# Patient Record
Sex: Female | Born: 1935 | Race: White | Hispanic: No | Smoking: Former smoker
Health system: Southern US, Community
[De-identification: ages and names within clinical notes are randomized; demographics above are authoritative.]

## PROBLEM LIST (undated history)

## (undated) DIAGNOSIS — M109 Gout, unspecified: Secondary | ICD-10-CM

## (undated) DIAGNOSIS — E119 Type 2 diabetes mellitus without complications: Secondary | ICD-10-CM

## (undated) DIAGNOSIS — E039 Hypothyroidism, unspecified: Secondary | ICD-10-CM

## (undated) DIAGNOSIS — R059 Cough, unspecified: Secondary | ICD-10-CM

## (undated) DIAGNOSIS — I639 Cerebral infarction, unspecified: Secondary | ICD-10-CM

## (undated) DIAGNOSIS — I10 Essential (primary) hypertension: Secondary | ICD-10-CM

## (undated) DIAGNOSIS — K279 Peptic ulcer, site unspecified, unspecified as acute or chronic, without hemorrhage or perforation: Secondary | ICD-10-CM

## (undated) DIAGNOSIS — D649 Anemia, unspecified: Secondary | ICD-10-CM

## (undated) DIAGNOSIS — Z972 Presence of dental prosthetic device (complete) (partial): Secondary | ICD-10-CM

## (undated) DIAGNOSIS — R05 Cough: Secondary | ICD-10-CM

## (undated) HISTORY — PX: OTHER SURGICAL HISTORY: SHX169

## (undated) HISTORY — PX: APPENDECTOMY: SHX54

## (undated) HISTORY — PX: DILATION AND CURETTAGE OF UTERUS: SHX78

## (undated) HISTORY — PX: TONSILLECTOMY: SUR1361

## (undated) HISTORY — PX: TUBAL LIGATION: SHX77

---

## 2004-09-17 ENCOUNTER — Ambulatory Visit: Payer: Self-pay | Admitting: Internal Medicine

## 2005-10-26 ENCOUNTER — Ambulatory Visit: Payer: Self-pay | Admitting: Internal Medicine

## 2005-10-26 IMAGING — MG MM CAD SCREENING MAMMO
1 series · 4 of 4 positions shown · non-contrast
Comparison: none

REASON FOR EXAM: Screening mammography
COMMENTS:

PROCEDURE:     MAM - MAM DGTL SCREENING MAMMO W/CAD  - October 26, 2005 [DATE]
RESULT:       The current exam is compared to prior examinations of 09/17/04
and 08/06/03.  No mass or malignant-appearing microcalcifications are seen.
The breast parenchyma is of the fatty type.

[Series 5309: R CC · right · 4 of 4 slices shown]
[im 1/4]
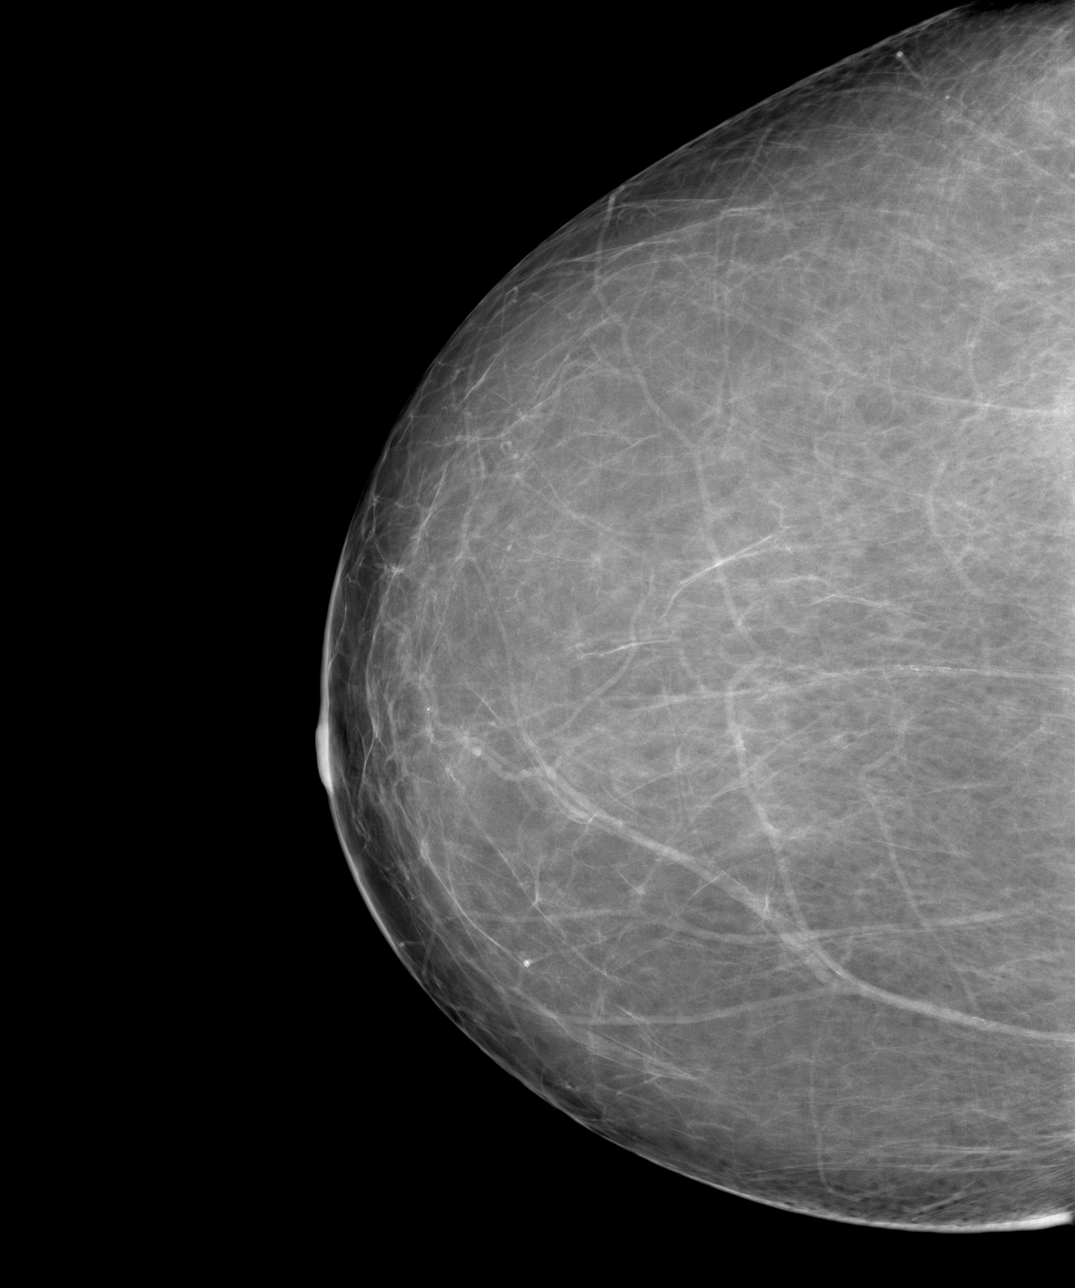
[im 2/4]
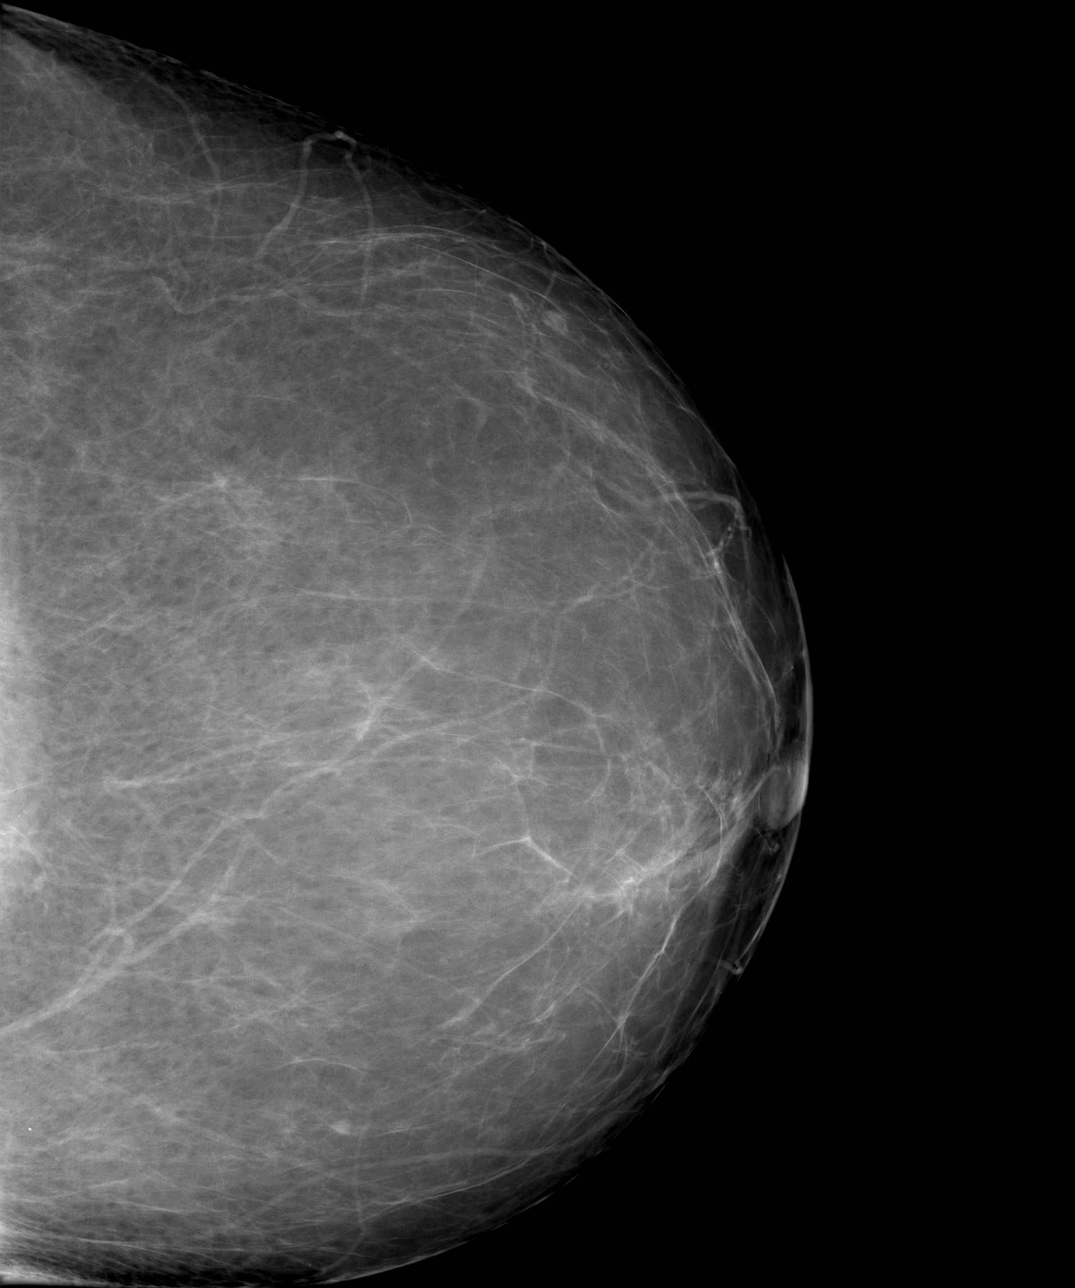
[im 3/4]
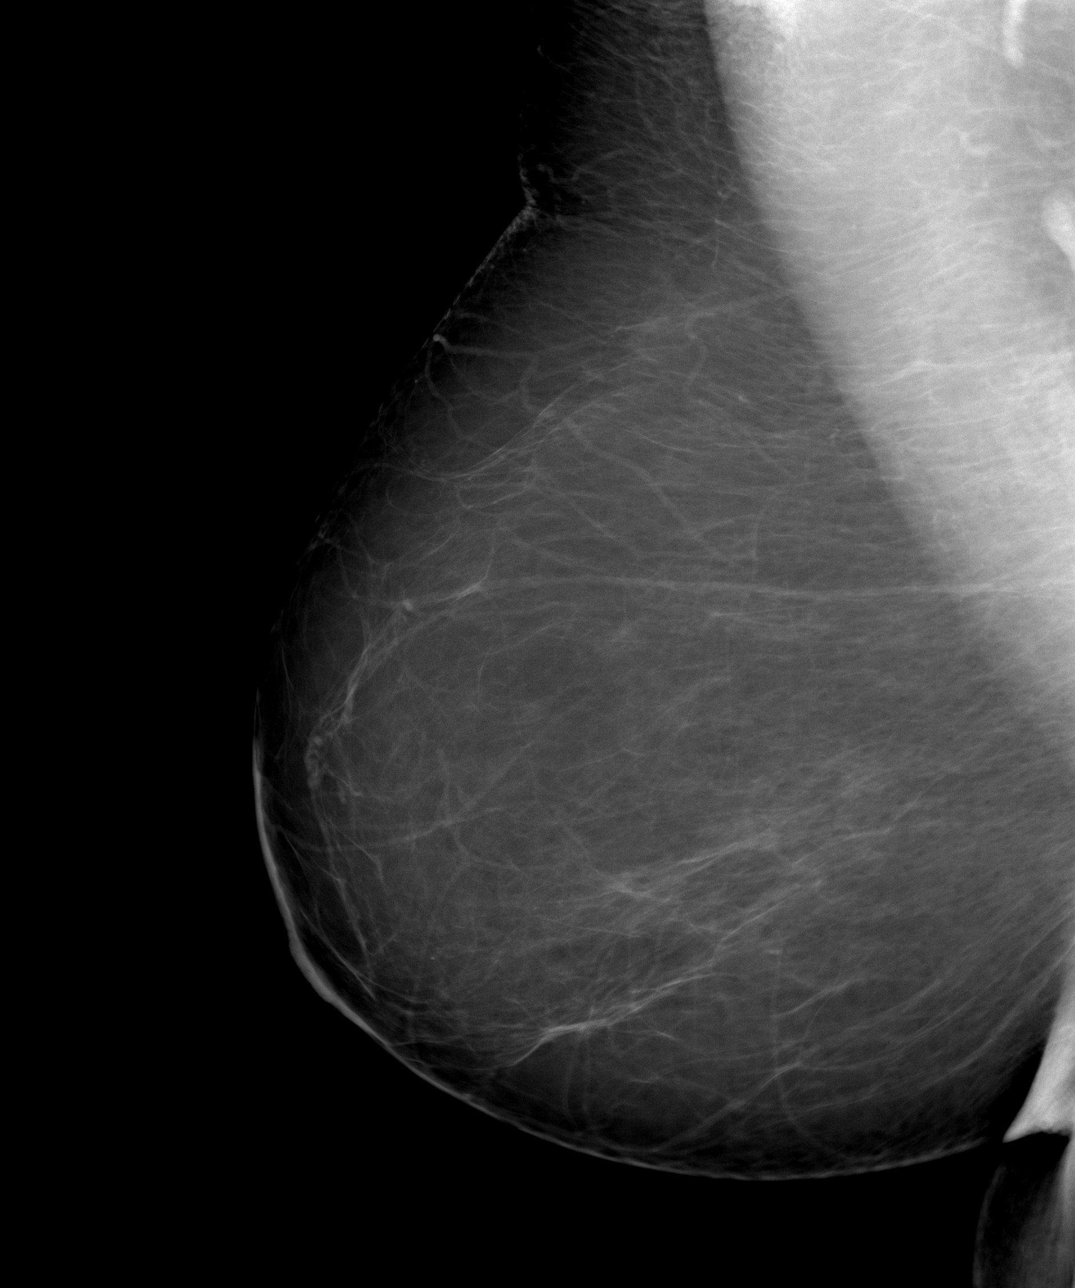
[im 4/4]
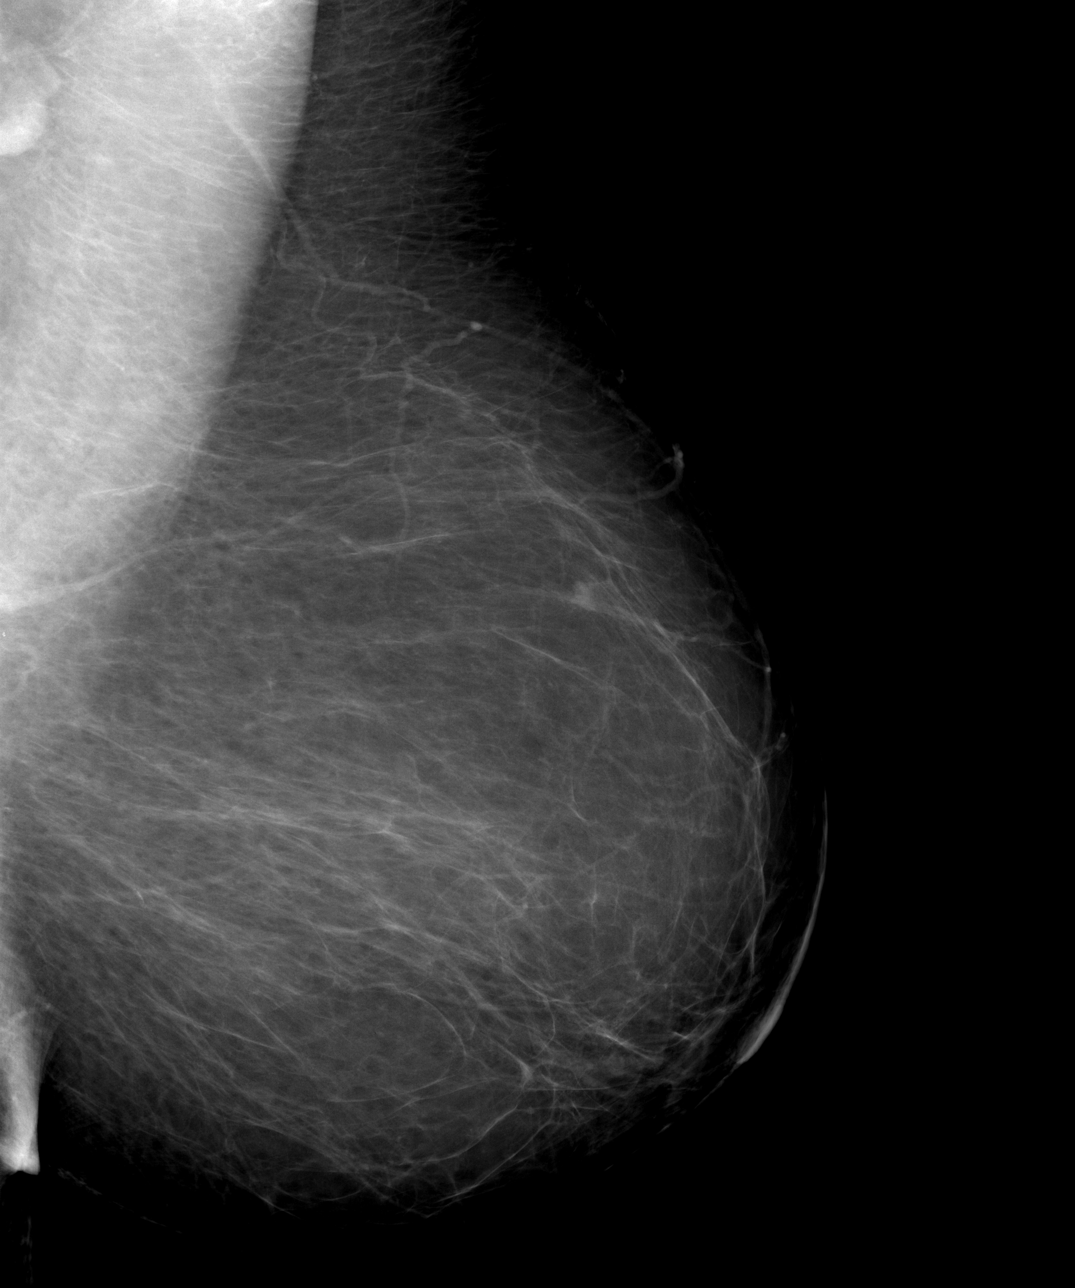

[4 of 4 positions shown; findings below may reference images not displayed]

IMPRESSION: 1.     Bilaterally benign appearing screening mammography.
2.     Continued annual screening mammography is recommended.
3.     BI-RADS: Category 1-Negative.

A NEGATIVE MAMMOGRAM REPORT DOES NOT PRECLUDE BIOPSY OR OTHER EVALUATION OF
A CLINICALLY PALPABLE OR OTHERWISE SUSPICIOUS MASS OR LESION.   BREAST

## 2006-01-23 ENCOUNTER — Other Ambulatory Visit: Payer: Self-pay

## 2006-01-23 ENCOUNTER — Inpatient Hospital Stay: Payer: Self-pay | Admitting: Surgery

## 2006-01-23 IMAGING — US ABDOMEN ULTRASOUND
1 series · 17 of 25 positions shown · non-contrast
Comparison: none

REASON FOR EXAM: Epigastric and RUQ pain with nausea
COMMENTS:

[Series 1: abdomen ultrasound · 17 of 73 slices shown]
[im 1/73]
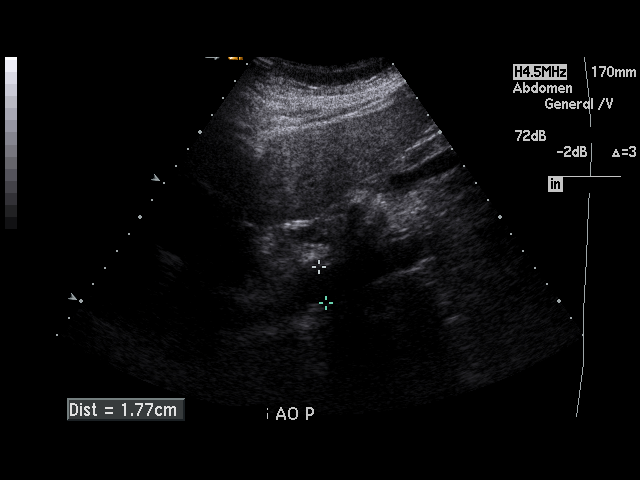
[im 7/73]
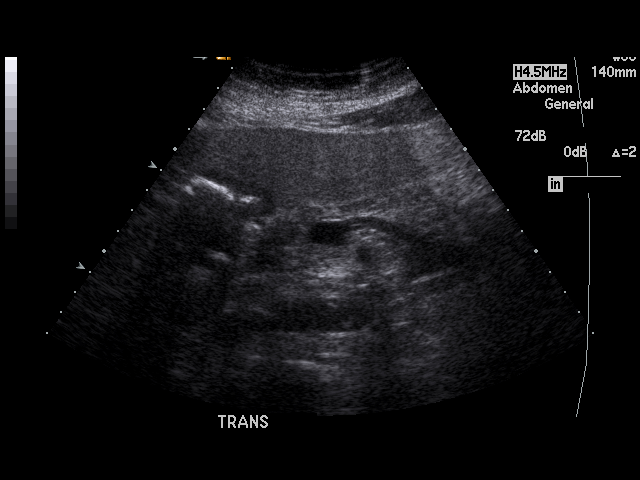
[im 10/73]
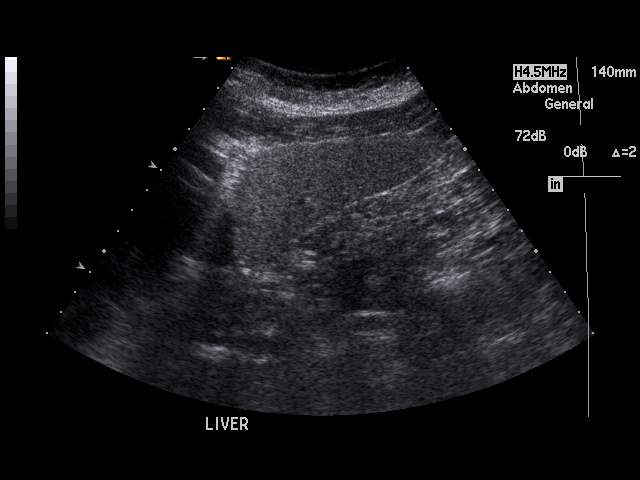
[im 16/73]
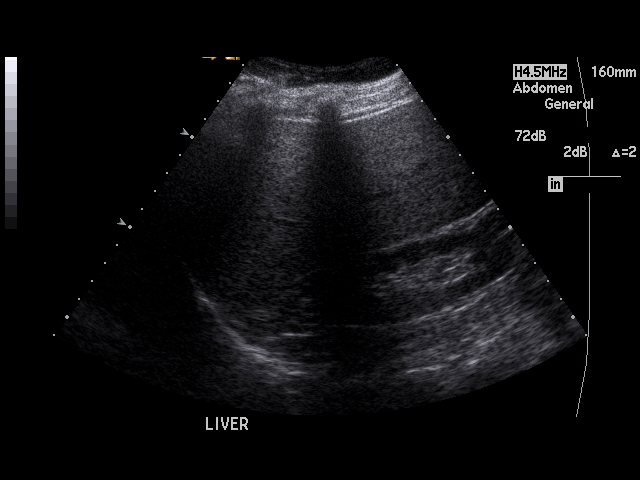
[im 19/73]
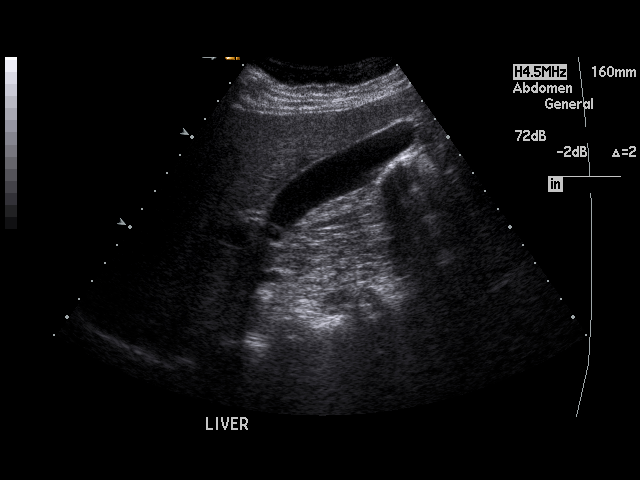
[im 25/73]
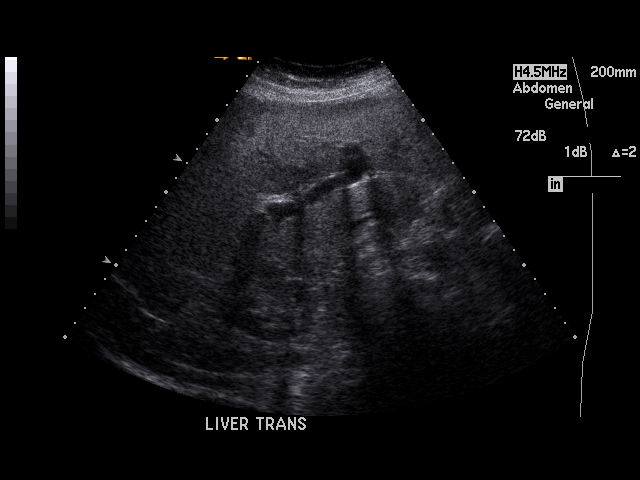
[im 28/73]
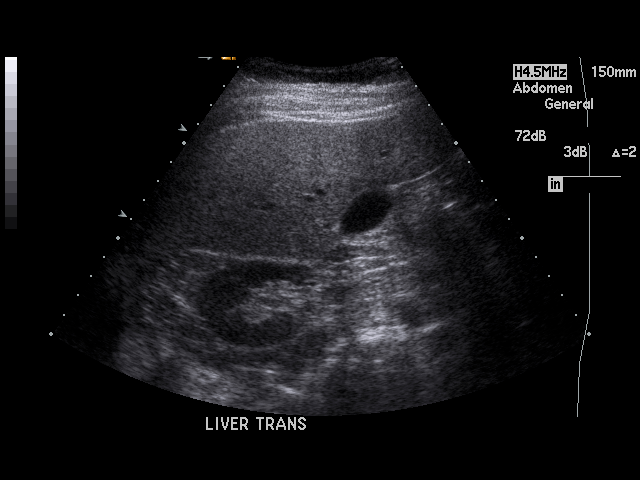
[im 34/73]
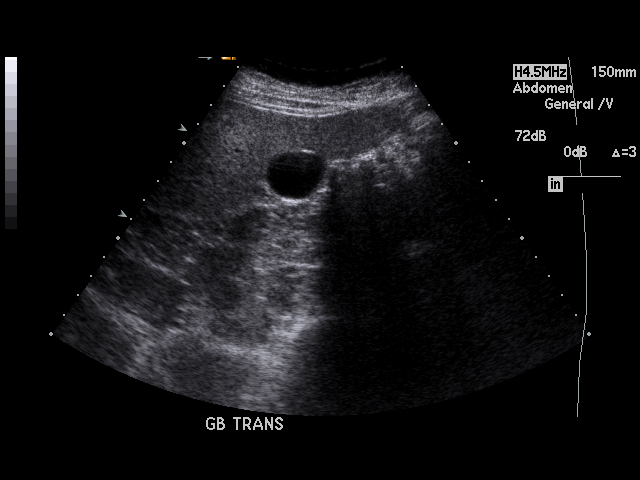
[im 37/73]
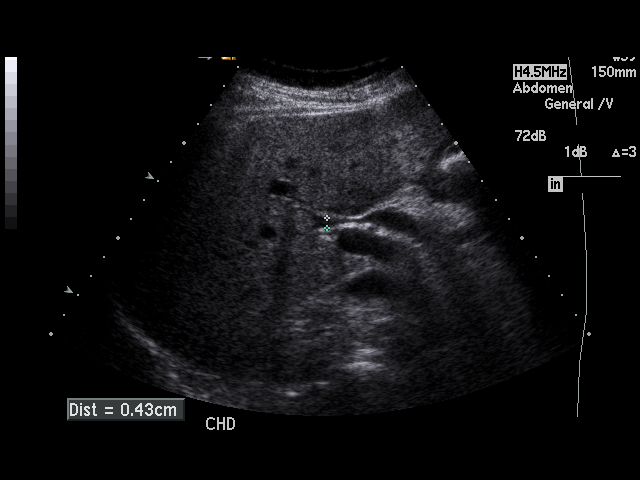
[im 40/73]
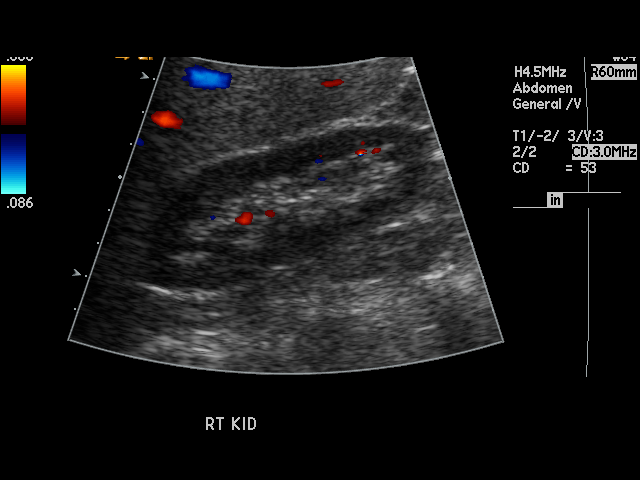
[im 46/73]
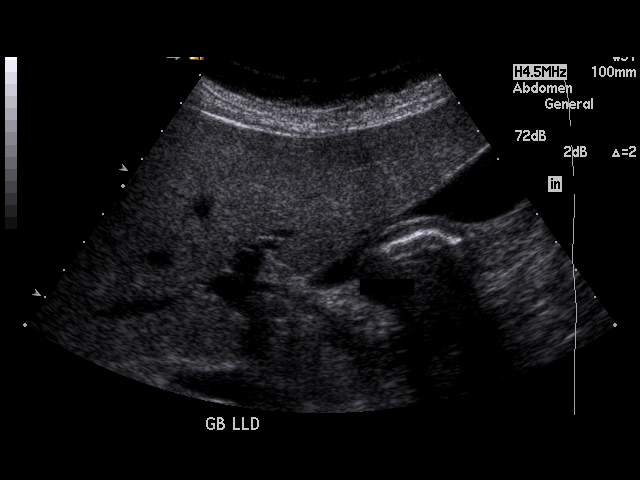
[im 49/73]
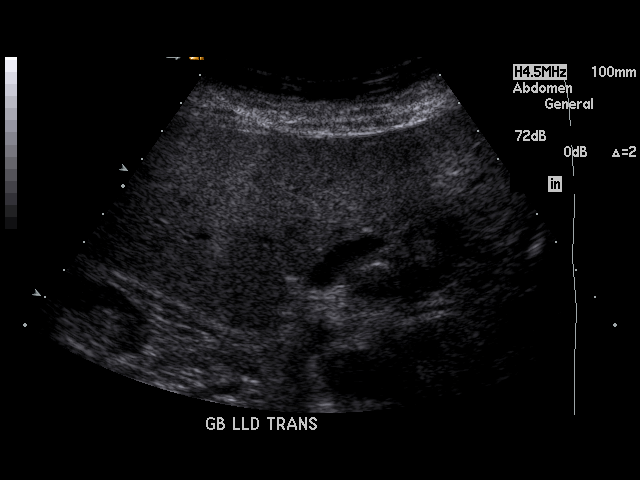
[im 55/73]
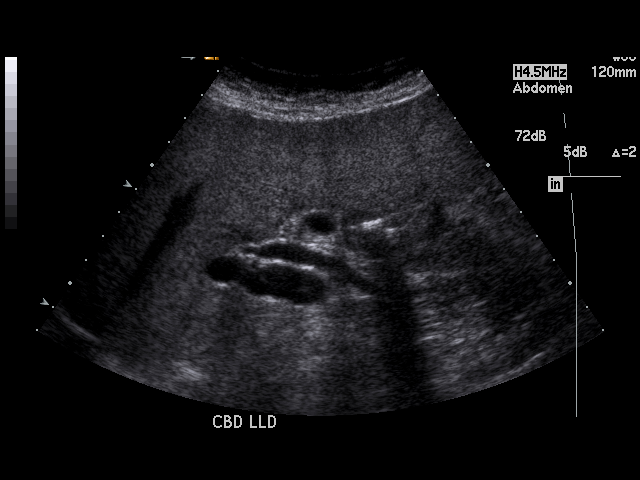
[im 58/73]
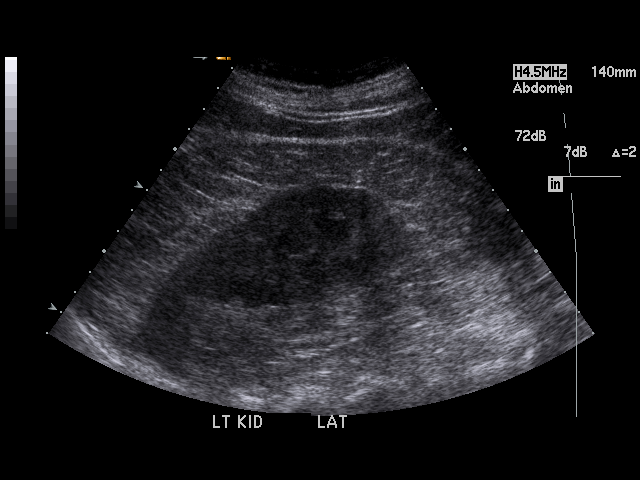
[im 64/73]
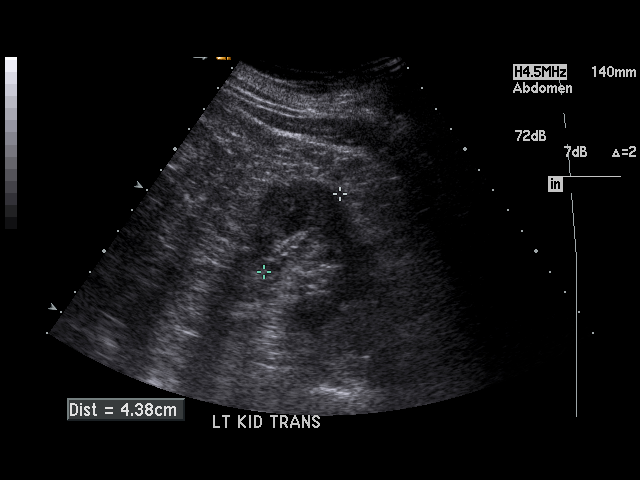
[im 67/73]
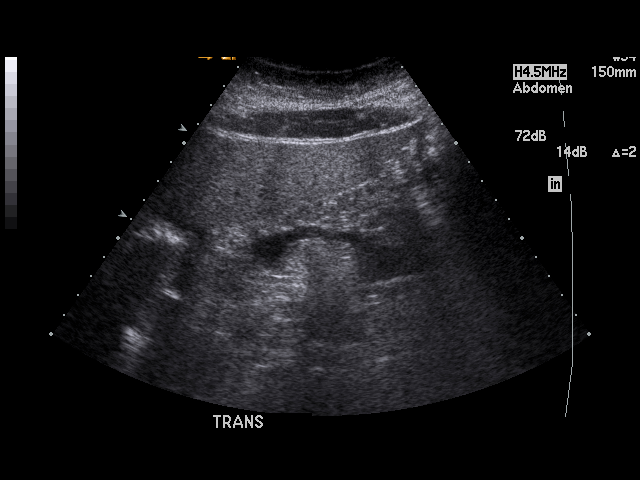
[im 73/73]
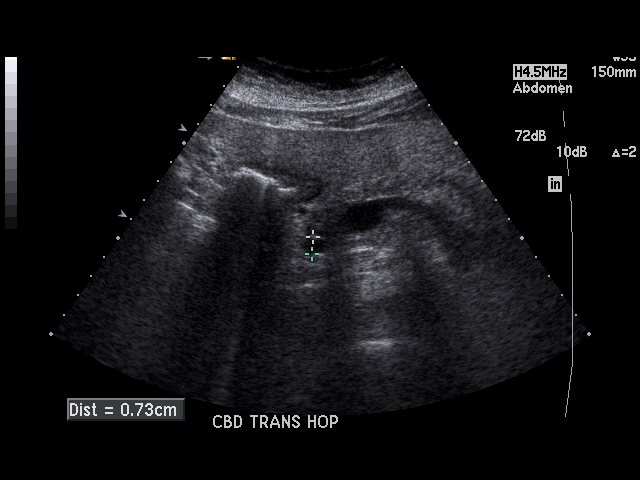

[17 of 25 positions shown; findings below may reference images not displayed]

PROCEDURE:     US  - US ABDOMEN GENERAL SURVEY  - January 23, 2006  [DATE]

RESULT:      The liver demonstrates a heterogeneous echotexture.  The common
bile duct measures 9.5 mm in diameter.  Gallbladder wall thickness is
mm.  There is no evidence of pericholecystic fluid, stones or sludging.  The
patient did not demonstrate a sonographic Murphy sign.

The pancreatic duct is dilated in its proximal location at 3.2 mm.  The
pancreas otherwise demonstrates a mildly heterogeneous echotexture.
Evaluation of the kidneys and spleen demonstrate no radiographic
abnormalities.  There is no evidence of abdominal aortic aneurysm.
IMPRESSION: 1.     The common bile duct is dilated even considering the patient's age.
There is mild dilation/prominence of the proximal pancreatic duct.  Clinical
correlation recommended.
2.     No ultrasound evidence of acute cholecystitis.
3.     Dr. Laura of the Emergency Department was informed of these
findings at the time of the initial interpretation.

## 2006-01-23 IMAGING — CT CT ABD-PELV W/ CM
1 of 2 series · 15 of 32 positions shown, 19 images · non-contrast
Comparison: none

REASON FOR EXAM: (1)  Abdominal pain and nausea; (2) abdominal pain and
nausea
COMMENTS:

[Series 2: abdomen · axial · 0.73mm/px · z∈[-440,-40]mm · 15 of 56 slices shown, 19 images]
[im 3/56  soft-tissue]
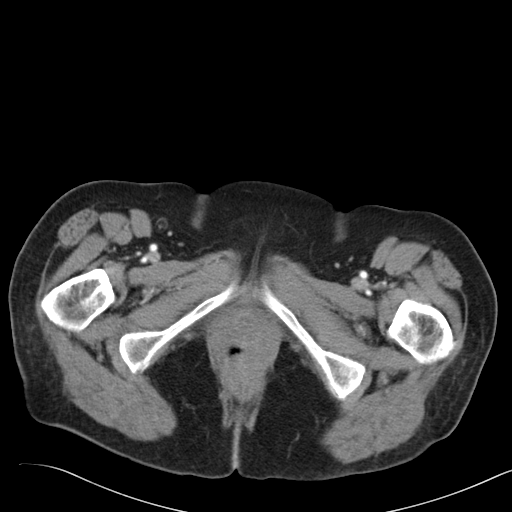
[im 3/56  bone]
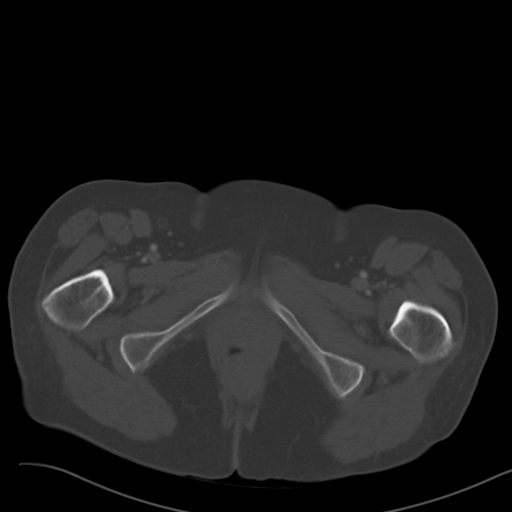
[im 7/56  soft-tissue]
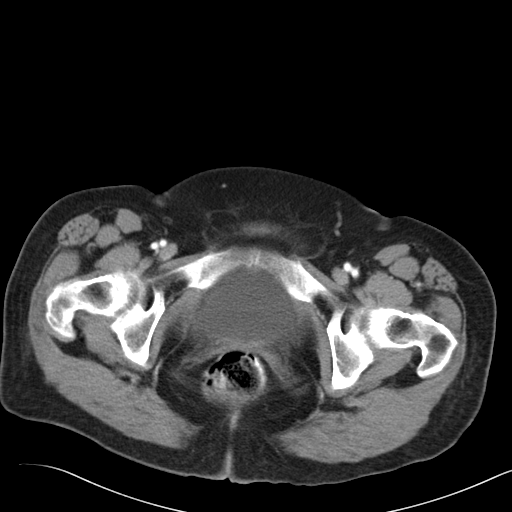
[im 12/56  soft-tissue]
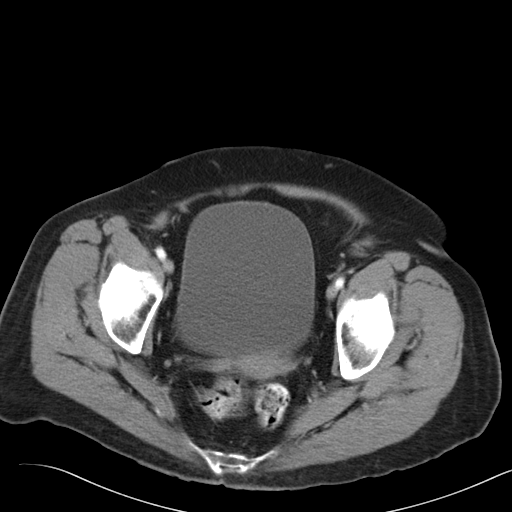
[im 17/56  soft-tissue]
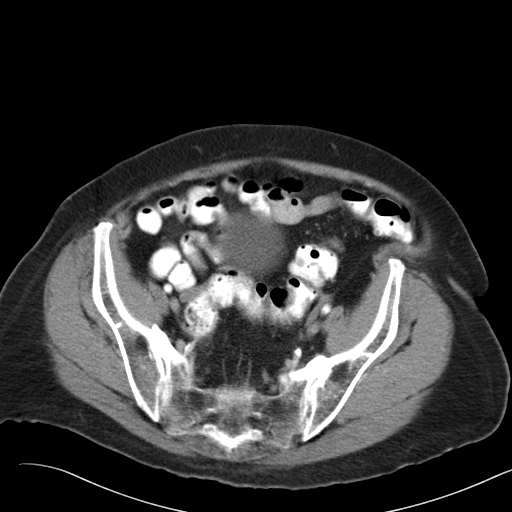
[im 19/56  soft-tissue]
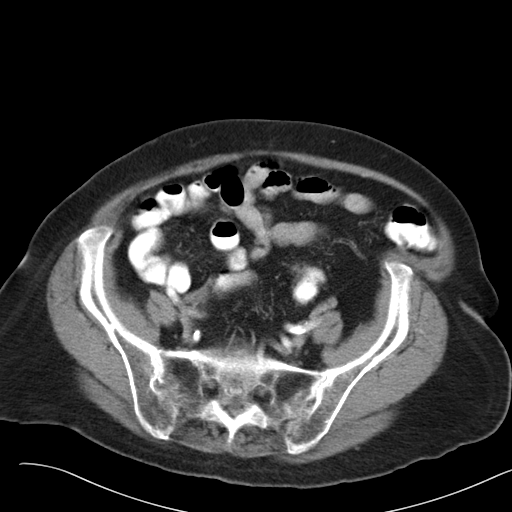
[im 23/56  soft-tissue]
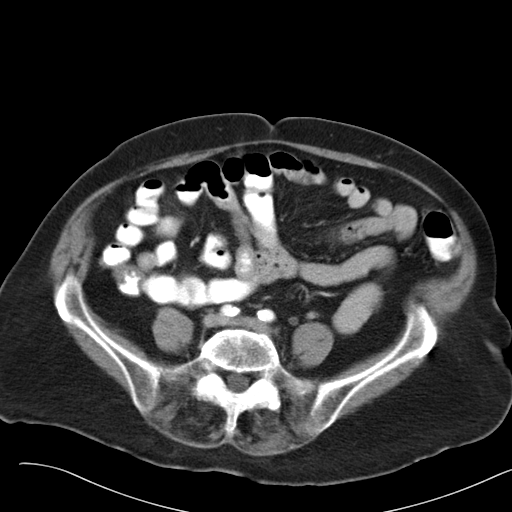
[im 28/56  soft-tissue]
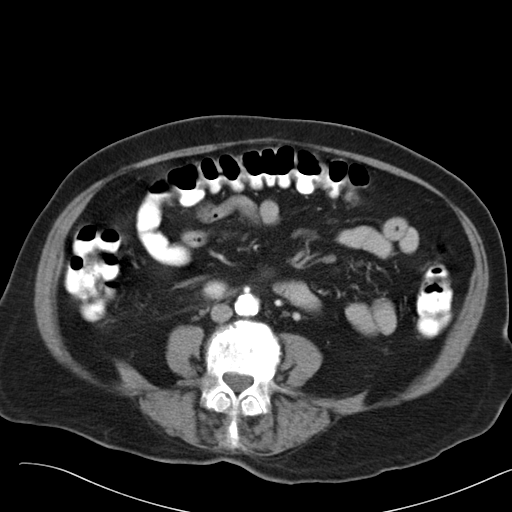
[im 33/56  soft-tissue]
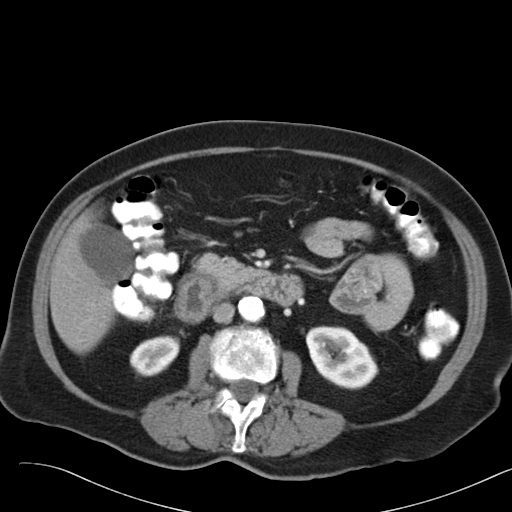
[im 37/56  soft-tissue]
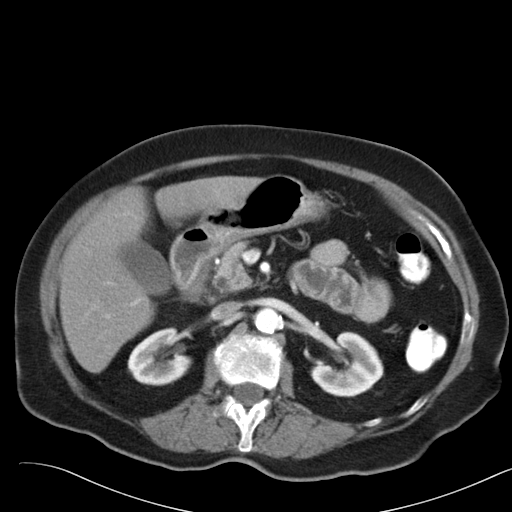
[im 37/56  bone]
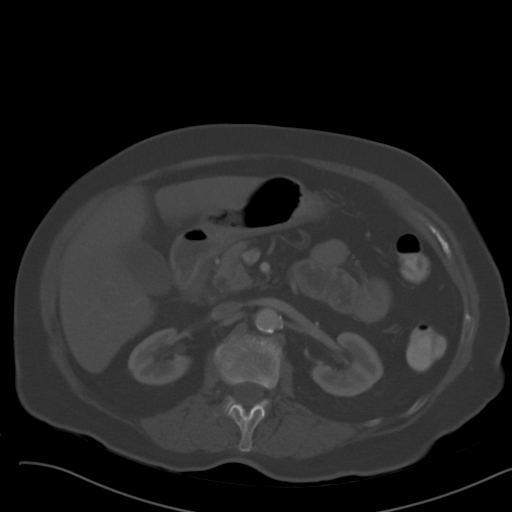
[im 39/56  soft-tissue]
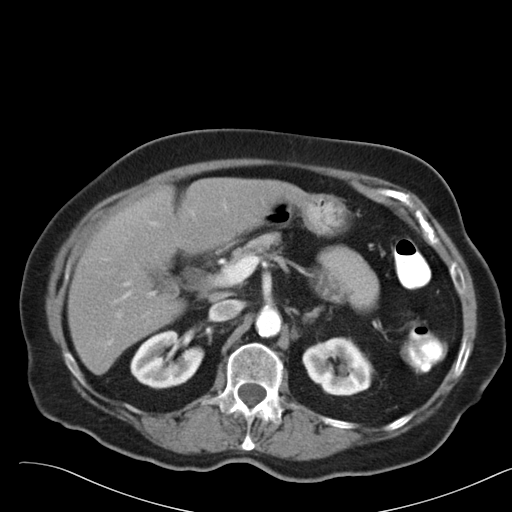
[im 44/56  soft-tissue]
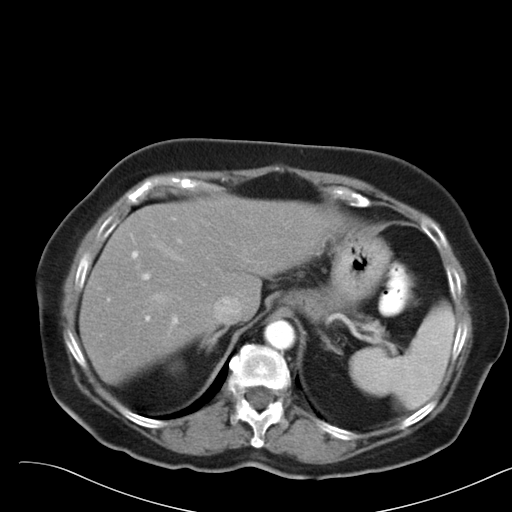
[im 46/56  lung]
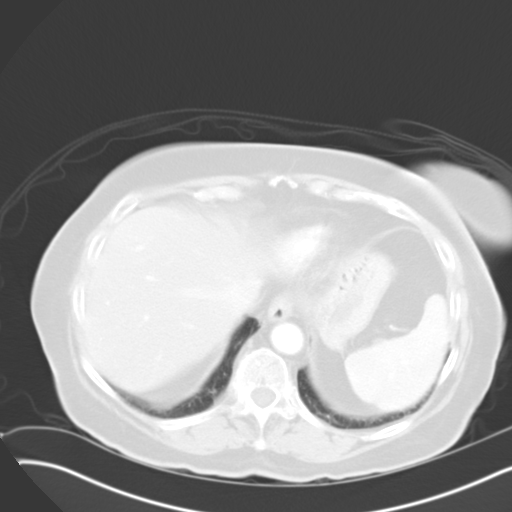
[im 49/56  soft-tissue]
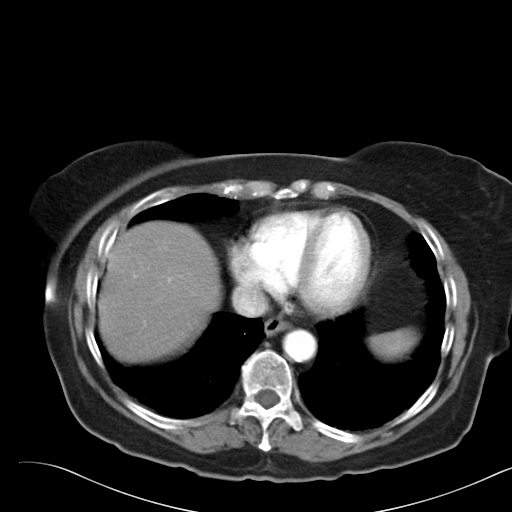
[im 49/56  lung]
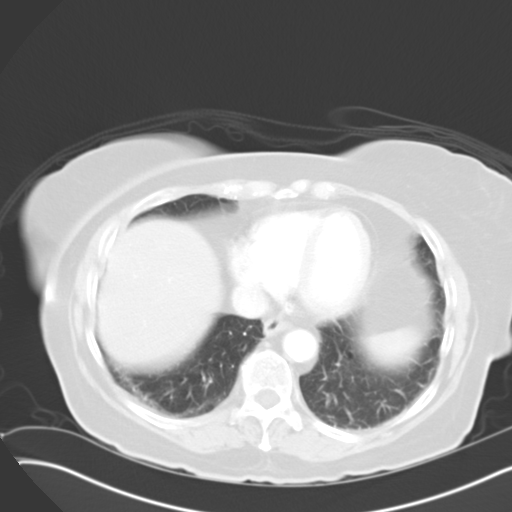
[im 51/56  lung]
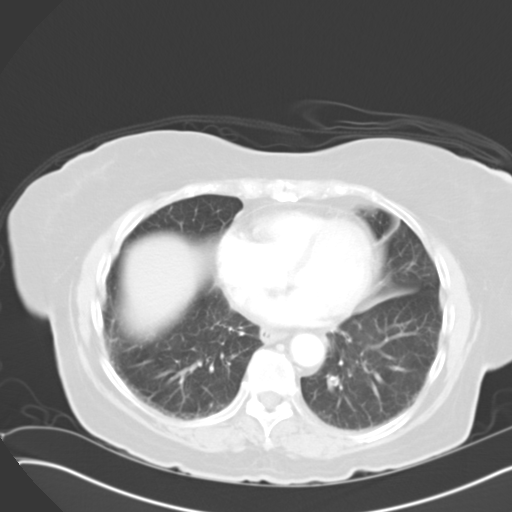
[im 53/56  soft-tissue]
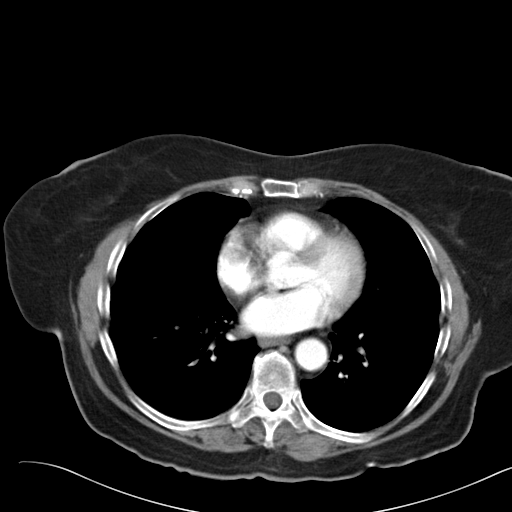
[im 53/56  lung]
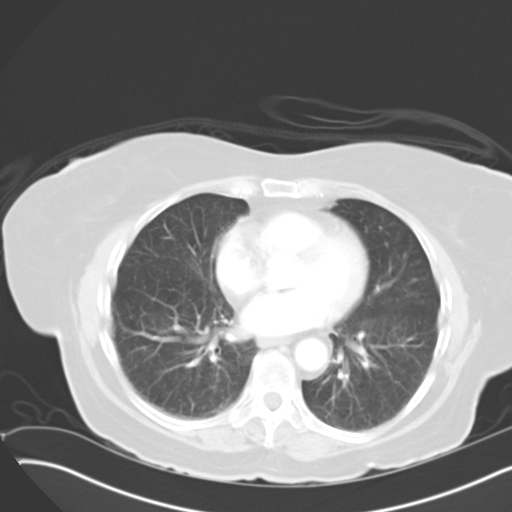

[15 of 32 positions shown; findings below may reference images not displayed]

PROCEDURE:     CT  - CT ABDOMEN / PELVIS  W  - January 23, 2006  [DATE]

RESULT:         Helical 8-mm sections were obtained from the lung bases
through the pubic symphysis status post intravenous administration of 100 ml
of Esovue-P93 and oral contrast.

Evaluation of the lung bases demonstrates a nodular density within the base
of the lingula measuring 7.8 mm.  This is appreciated primarily on the lung
windowing.  No further gross abnormalities are identified.

The liver, spleen, adrenals, pancreas, and kidneys are unremarkable.

Evaluation of the first portion of the duodenum demonstrates mild
inflammatory change within the surrounding mesenteric fat as well as
duodenal wall thickening.  There also appears to be a small amount of fluid
surrounding the duodenum primarily along the interface with the pancreas.

No further abdominal or pelvic masses, free fluid or drainable loculated
fluid collections are identified.

The celiac, SMA and IMA are patent.

Note, a definitive mass within the region of abnormality of the duodenum is
not clearly identified.
IMPRESSION: 1.     Inflammatory changes involving the first portion as well as the
second portion of the duodenum as described above.  Differential
considerations are etiology such as duodenitis or possibly the sequela of
duodenal ulcer disease.  There does not appear to be evidence of appreciable
free air.  This does not exclude the possibility of perforation which may
have subsequently walled off.  A mass within this area cannot be excluded
either and further evaluation with direct visualization is recommended.  No
drainable loculated fluid collections are associated with this finding.
2.     Dr. Geralda of the Emergency Department was informed of these
findings at the time of the initial interpretation.

## 2006-01-25 IMAGING — XA DG UGI W/O KUB
1 series · 14 of 24 positions shown · non-contrast
Comparison: none

REASON FOR EXAM: Epigastric pain, possible duodenal ulcer
COMMENTS:

[Series 1: run · 14 of 30 slices shown]
[im 1/30]
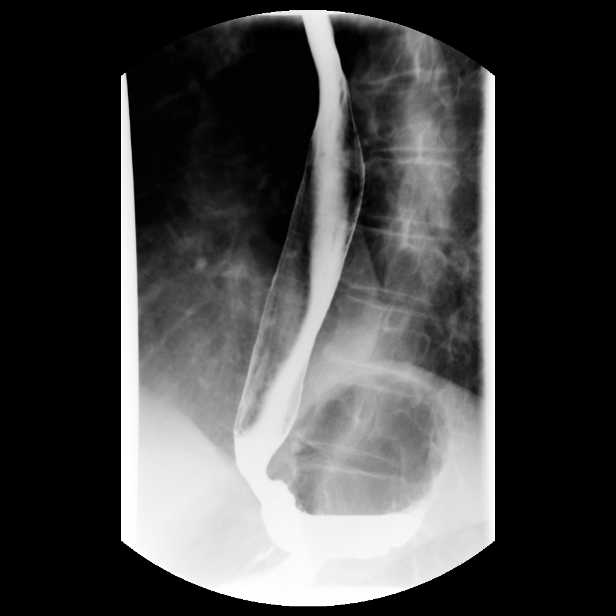
[im 3/30]
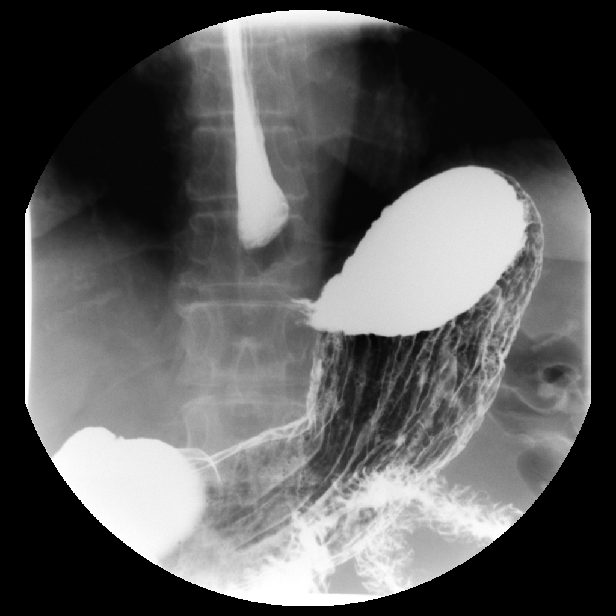
[im 6/30]
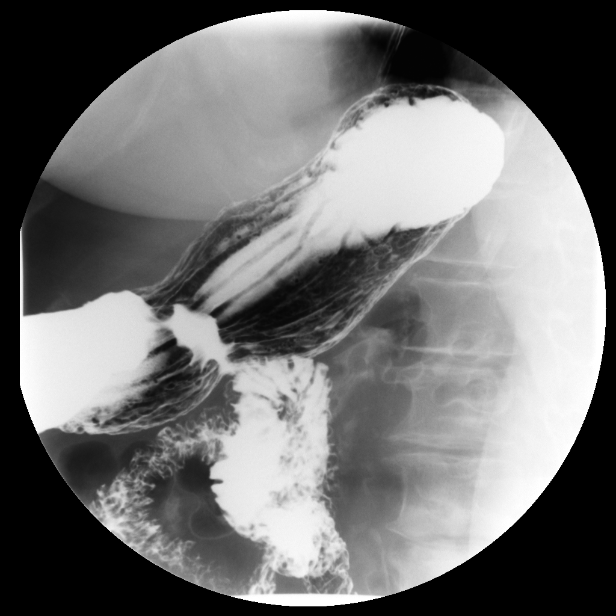
[im 8/30]
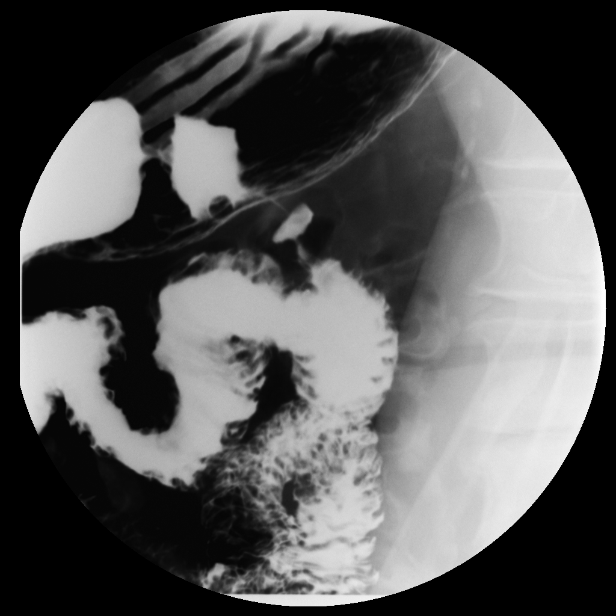
[im 9/30]
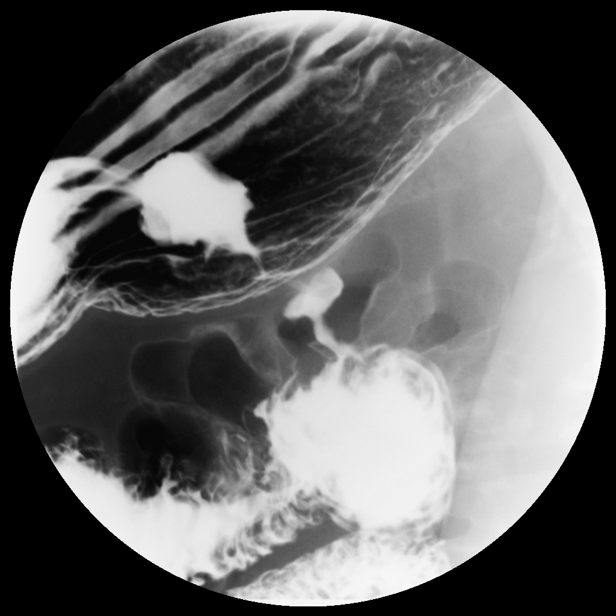
[im 12/30]
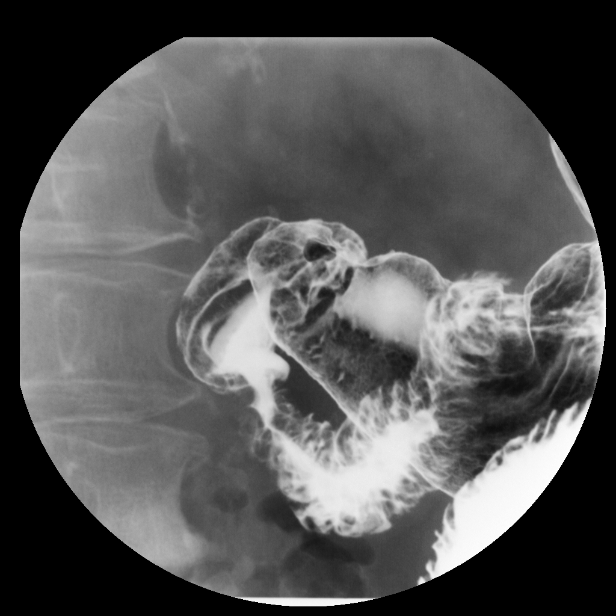
[im 14/30]
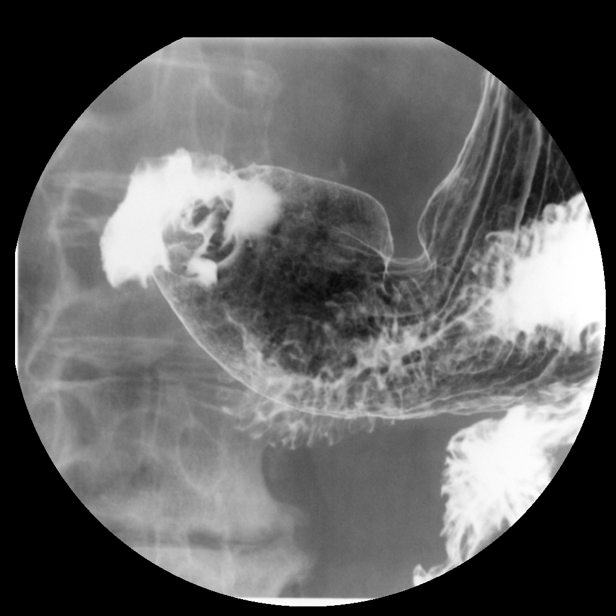
[im 16/30]
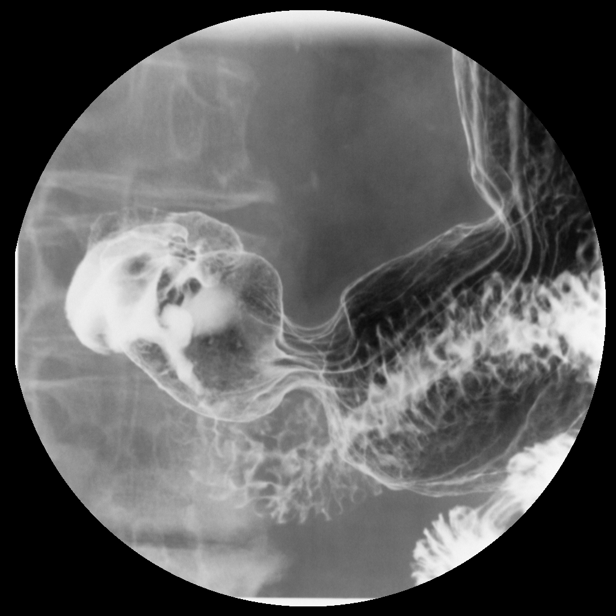
[im 18/30]
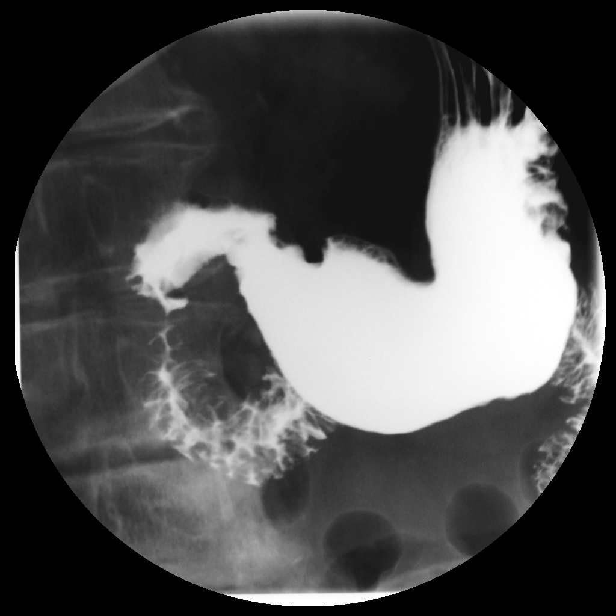
[im 21/30]
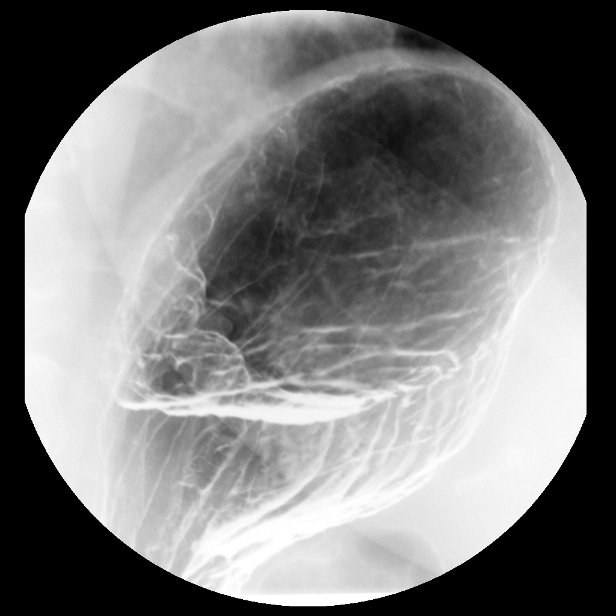
[im 23/30]
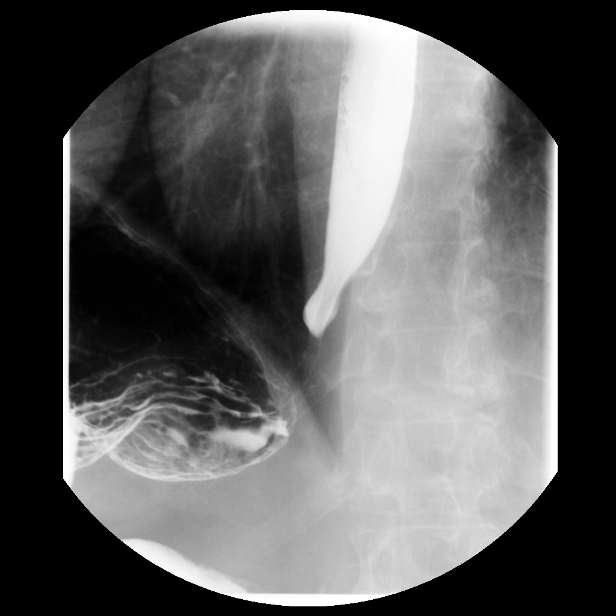
[im 24/30]
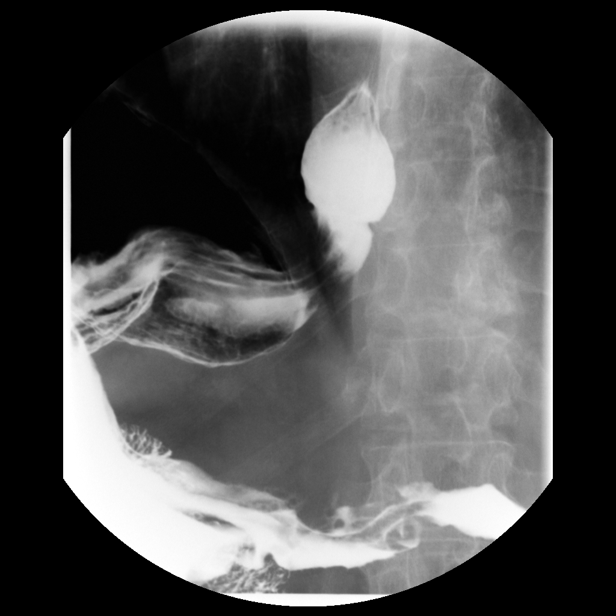
[im 27/30]
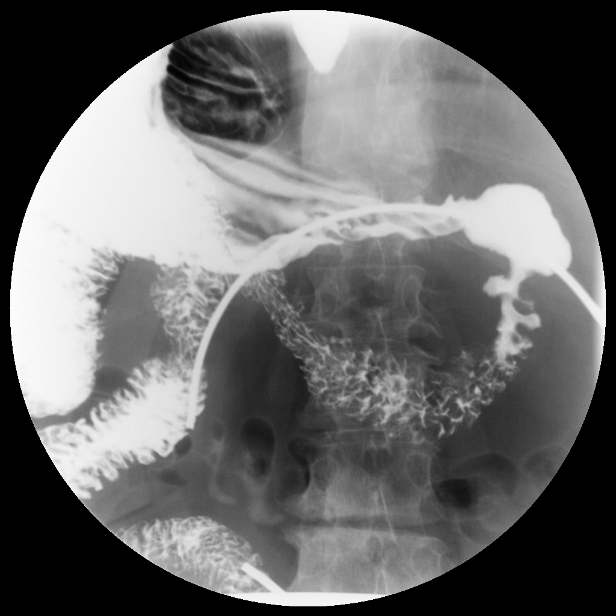
[im 30/30]
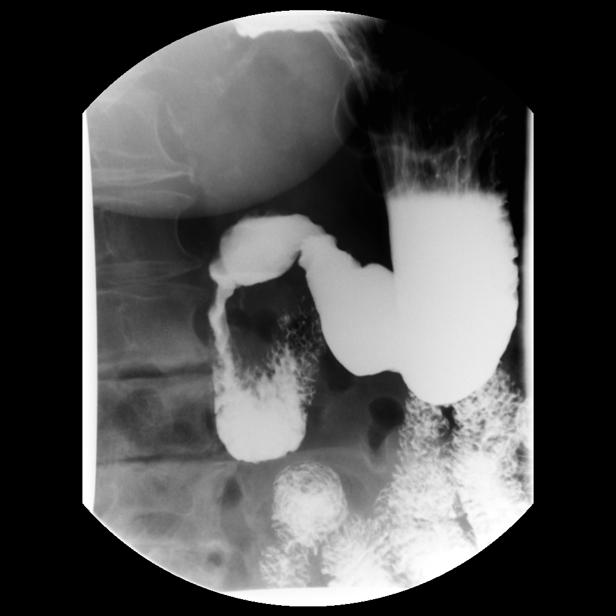

[14 of 24 positions shown; findings below may reference images not displayed]

PROCEDURE:     FL  - FL UPPER GI  - January 25, 2006  [DATE]

RESULT:        The patient has epigastric discomfort and possible duodenal
ulcer.  A CT scan performed on 23 January, 2006 revealed evidence of
duodenal mucosal edema.

The anticipated procedure was discussed with Ms. Malesha.  She voiced her
willingness to proceed.  The patient ingested barium without difficulty.
The thoracic esophagus distended well.  There was a moderate amount of
spontaneous gastroesophageal reflux. A small reducible hiatal hernia was
present.   A 12 mm barium pilled passed without difficulty.

The stomach distended well.  The pyloric region was normal in appearance.
The duodenal bulb distended well.  Just distal to the bulb, however, there
was thickening of the mucosa and submucosa resulting in narrowing of the
lumen.  A persistent area of barium collection was demonstrated in the
secondary portion of the duodenum.  The appearance is consistent with that
of a ulcer niche though the possibility of a diverticulum in this region is
not entirely excluded.  As one moves more distally the third and fourth
portions of the duodenum demonstrate normal mucosal appearance.
IMPRESSION: 1.     There is thickening of the walls of the second portion of the
duodenum with narrowing of the lumen and with their being a small barium
collection consistent with a subcentimeter ulcer niche versus small
diverticulum.  Given the appearance of the surrounding mucosa,  I suspect
that there is indeed an ulcer present.  There is no gastric outlet
obstruction.
2.     The stomach distended well.
3.     There is a small reducible hiatal hernia with spontaneous
gastroesophageal reflux.

## 2006-03-22 ENCOUNTER — Ambulatory Visit: Payer: Self-pay | Admitting: Surgery

## 2006-11-02 ENCOUNTER — Ambulatory Visit: Payer: Self-pay | Admitting: Internal Medicine

## 2006-11-02 IMAGING — MG MM CAD SCREENING MAMMO
1 series · 4 of 4 positions shown · non-contrast
Comparison: none

REASON FOR EXAM: screening mammo
COMMENTS:

PROCEDURE:     MAM - MAM DGTL SCREENING MAMMO W/CAD  - November 02, 2006  [DATE]
RESULT:
COMPARISONS: Exams on 10-26-05 and 09-17-04.

[Series 3993: R CC · right · 4 of 4 slices shown]
[im 1/4]
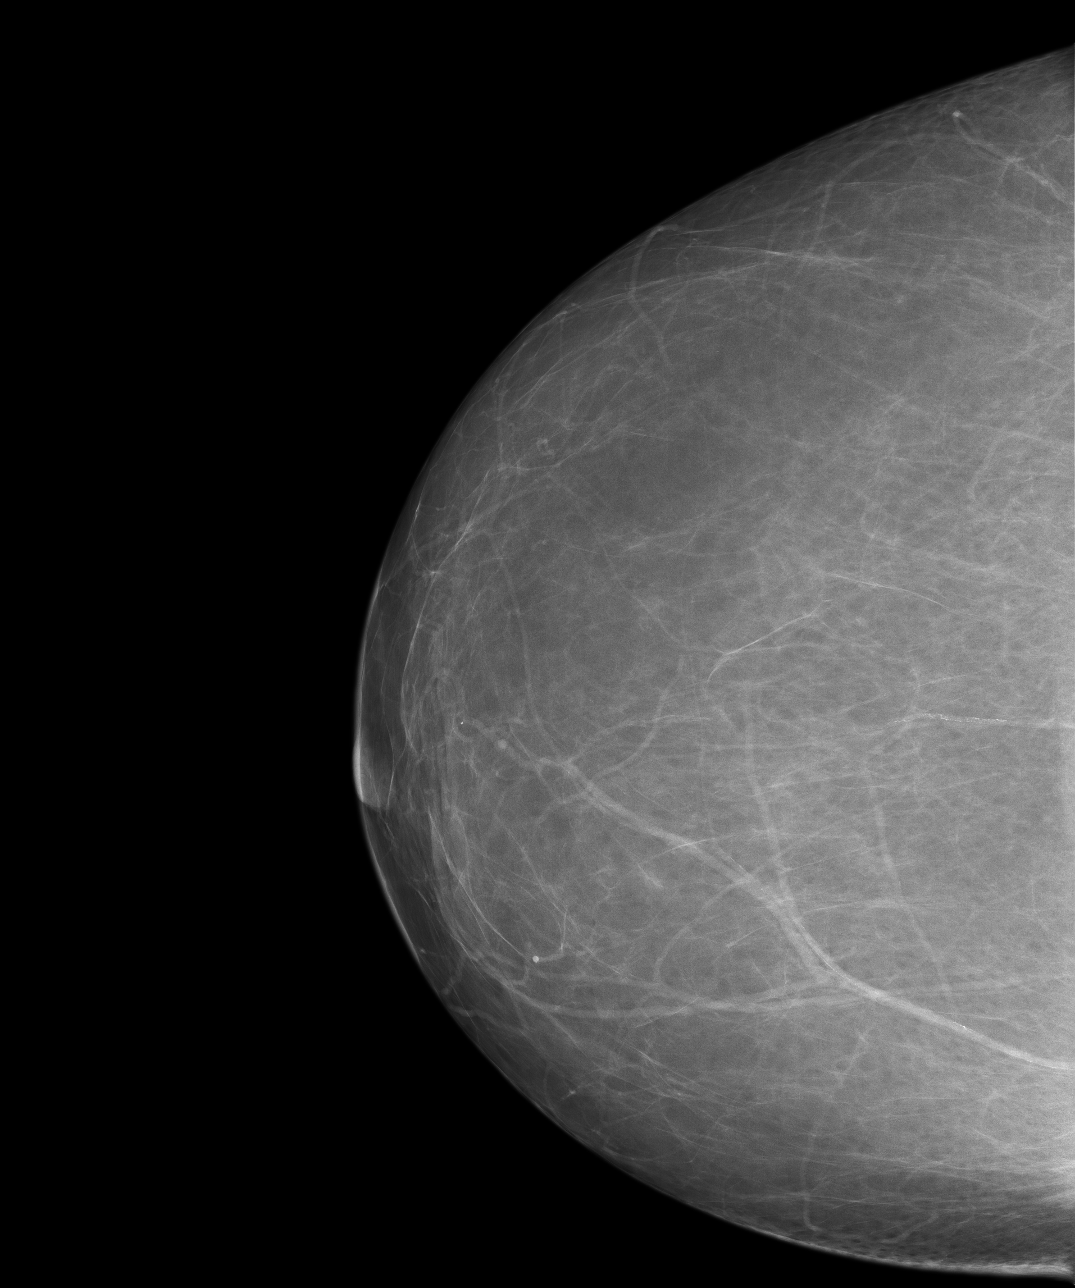
[im 2/4]
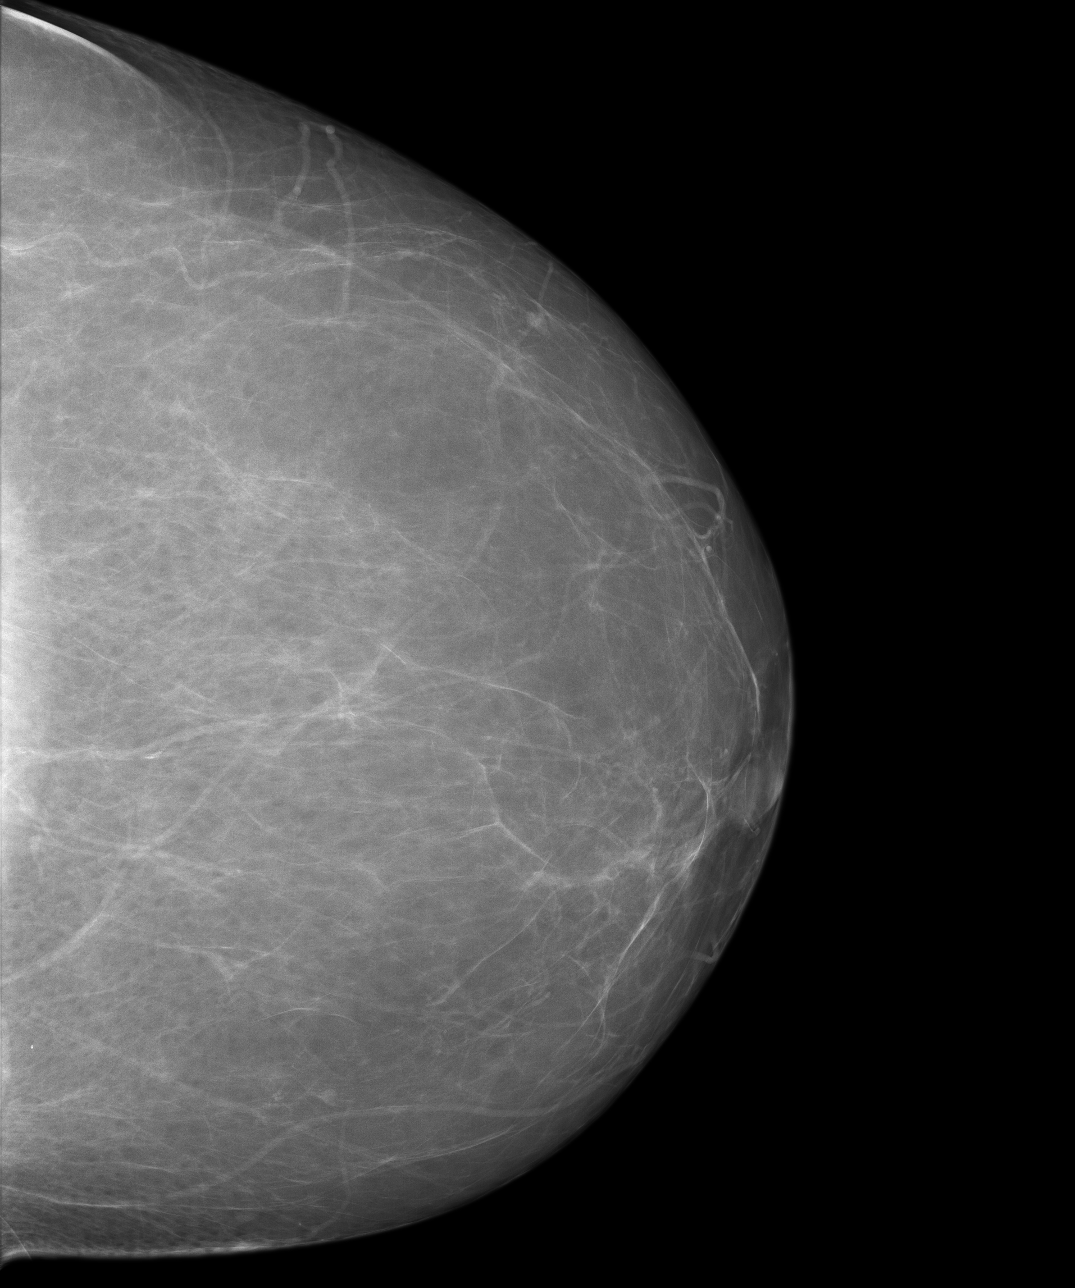
[im 3/4]
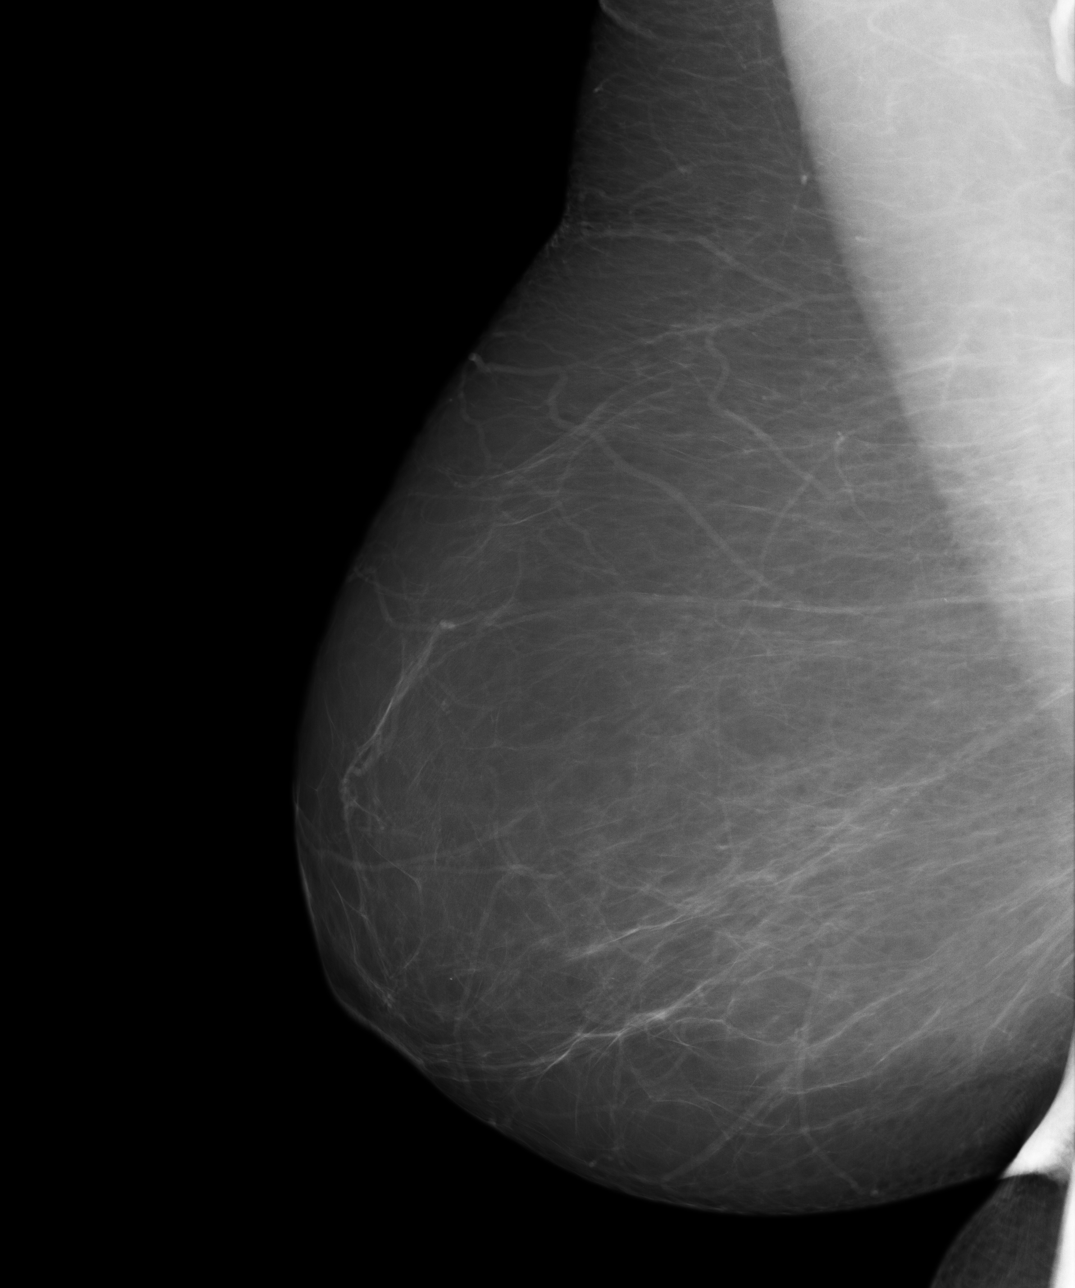
[im 4/4]
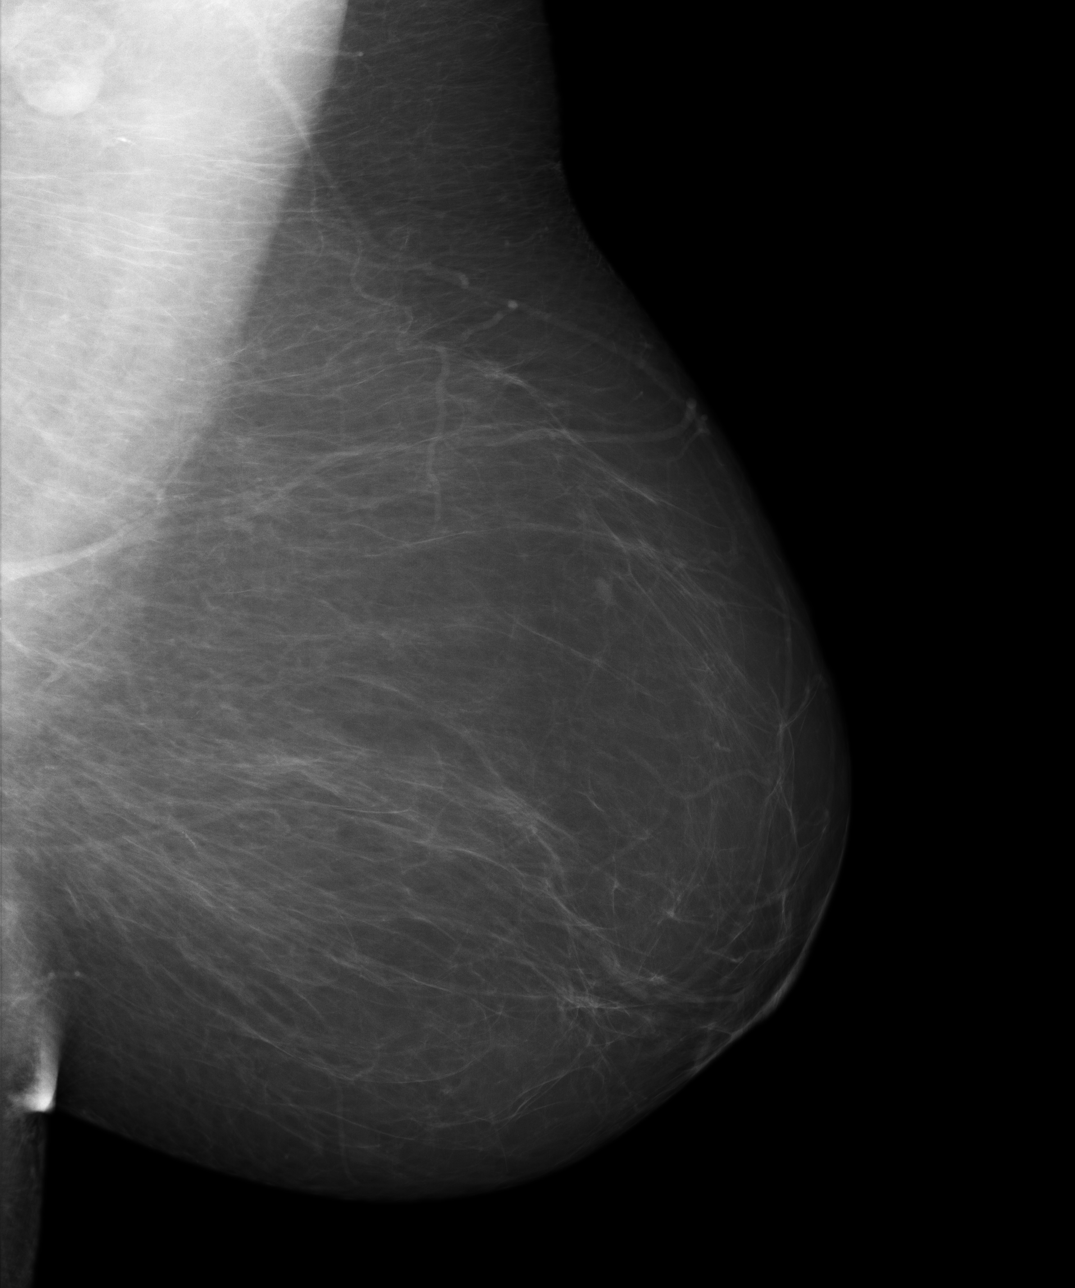

[4 of 4 positions shown; findings below may reference images not displayed]

FINDINGS: Predominantly fatty bilateral breast parenchyma is noted. No new
suspicious mass or calcification is seen.
IMPRESSION: 1)Benign findings, bilateral breasts.

2)Recommend routine follow-up mammogram in one year.

3)BI-RADS: Category 2-Benign Finding

A NEGATIVE MAMMOGRAM REPORT DOES NOT PRECLUDE BIOPSY OR OTHER EVALUATION OF
A CLINICALLY PALPABLE OR OTHERWISE SUSPICIOUS MASS OR LESION. BREAST CANCER
MAY NOT BE DETECTED BY MAMMOGRAPHY IN UP TO 10% OF CASES.

## 2007-11-06 ENCOUNTER — Ambulatory Visit: Payer: Self-pay | Admitting: Internal Medicine

## 2007-11-06 IMAGING — MG MM CAD SCREENING MAMMO
1 series · 4 of 4 positions shown · non-contrast
Comparison: none

REASON FOR EXAM: scr mammo
COMMENTS:

PROCEDURE:     MAM - MAM DGTL SCREENING MAMMO W/CAD  - November 06, 2007 [DATE]
RESULT:     No dominant masses or pathologic cluster of calcifications are
demonstrated.  Exam is stable from prior exams.  CAD evaluation is nonfocal.

[R CC · right · 4 of 4 slices shown]
[im 1/4]
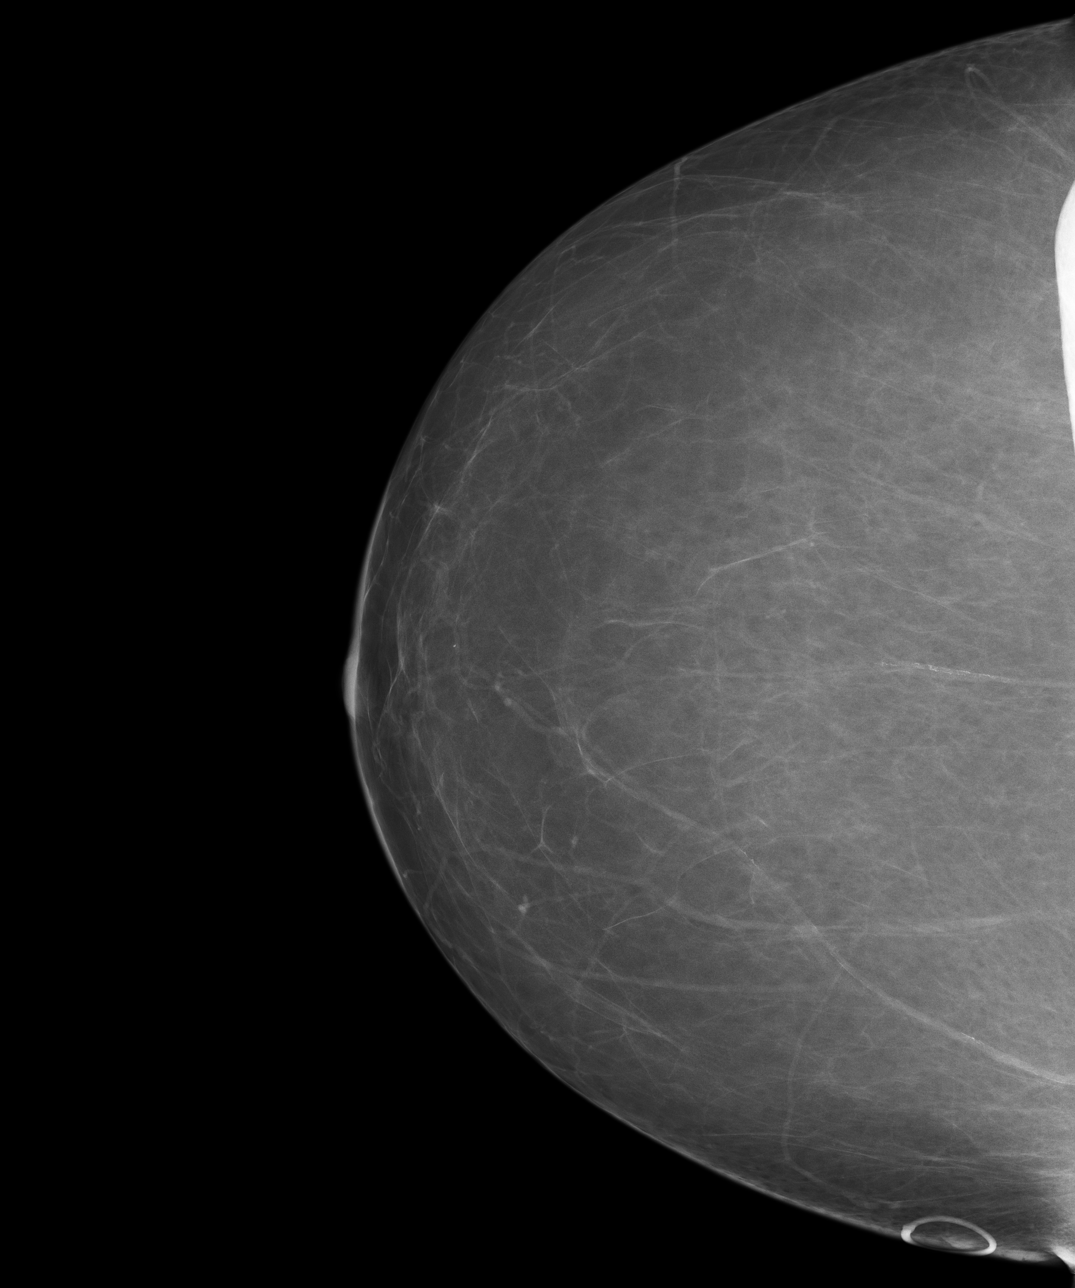
[im 2/4]
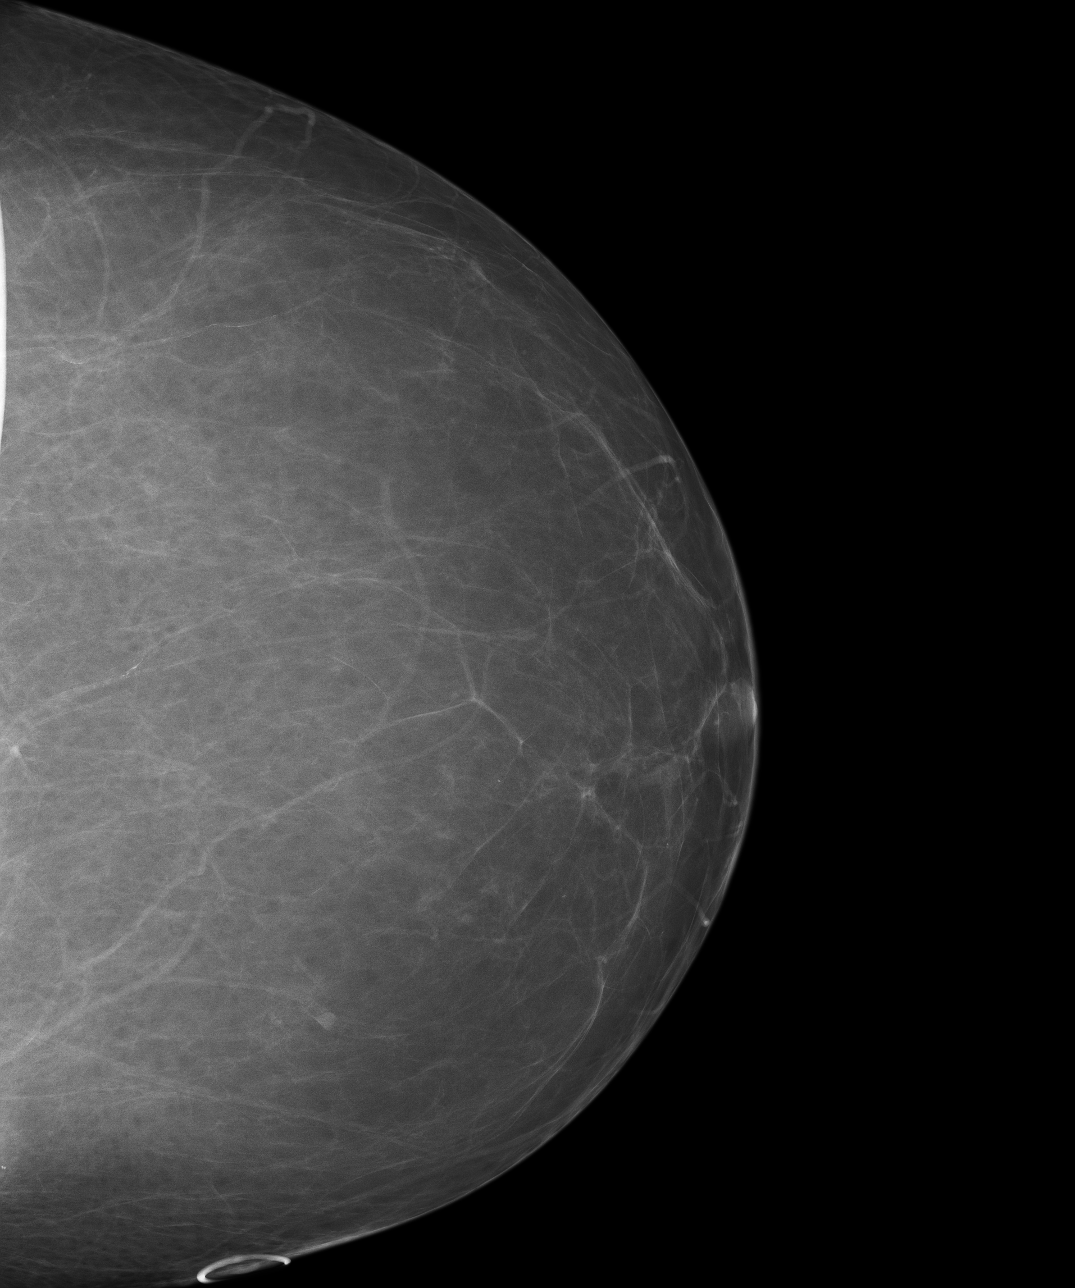
[im 3/4]
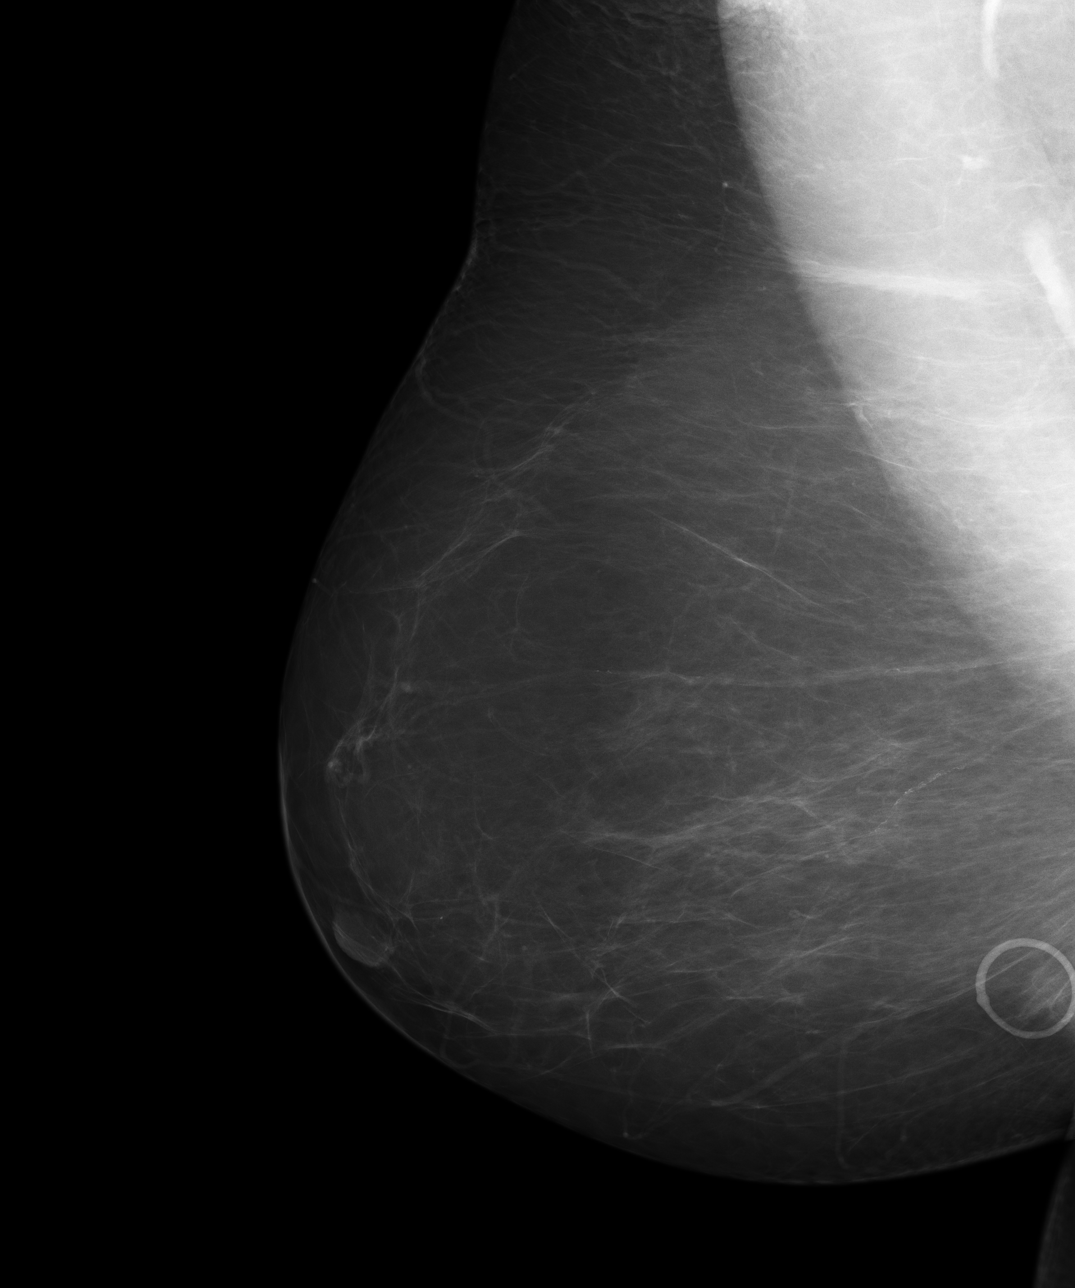
[im 4/4]
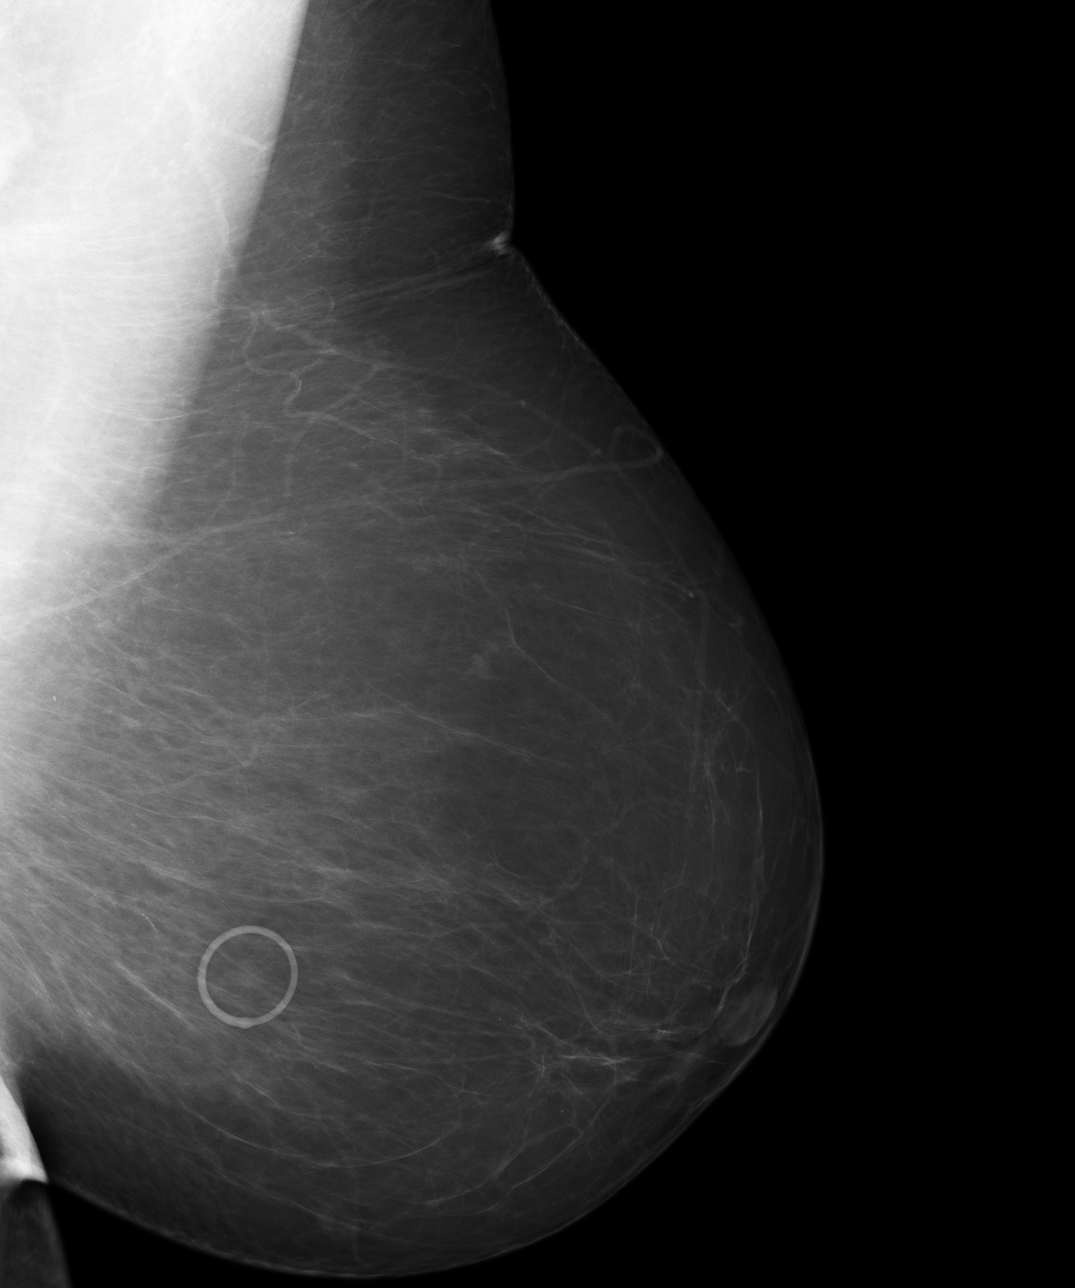

[4 of 4 positions shown; findings below may reference images not displayed]

IMPRESSION: Benign exam.  Routine yearly follow up mammogram is
suggested.

BI-RADS:  Category 2  Benign Finding

A NEGATIVE MAMMOGRAM REPORT DOES NOT PRECLUDE BIOPSY OR OTHER EVALUATION OF
A CLINICALLY PALPABLE OR OTHERWISE SUSPICIOUS MASS OR LESION.  BREAST CANCER
MAY NOT BE DETECTED BY MAMMOGRAPHY IN UP TO 10% OF CASES.

## 2008-11-06 ENCOUNTER — Ambulatory Visit: Payer: Self-pay | Admitting: Internal Medicine

## 2008-11-06 IMAGING — MG MM CAD SCREENING MAMMO
1 series · 4 of 4 positions shown · non-contrast
Comparison: none

REASON FOR EXAM: scr mammo
COMMENTS:

PROCEDURE:     MAM - MAM DGTL SCREENING MAMMO W/CAD  - November 06, 2008 [DATE]
RESULT:       Comparison is made to prior studies dated 09/17/04, 11/06/07,
11/02/06, and 10/26/05.
The breasts are primarily fatty involuted. There is no radiographic evidence
to suggest malignancy.

[R CC · right · 4 of 4 slices shown]
[im 1/4]
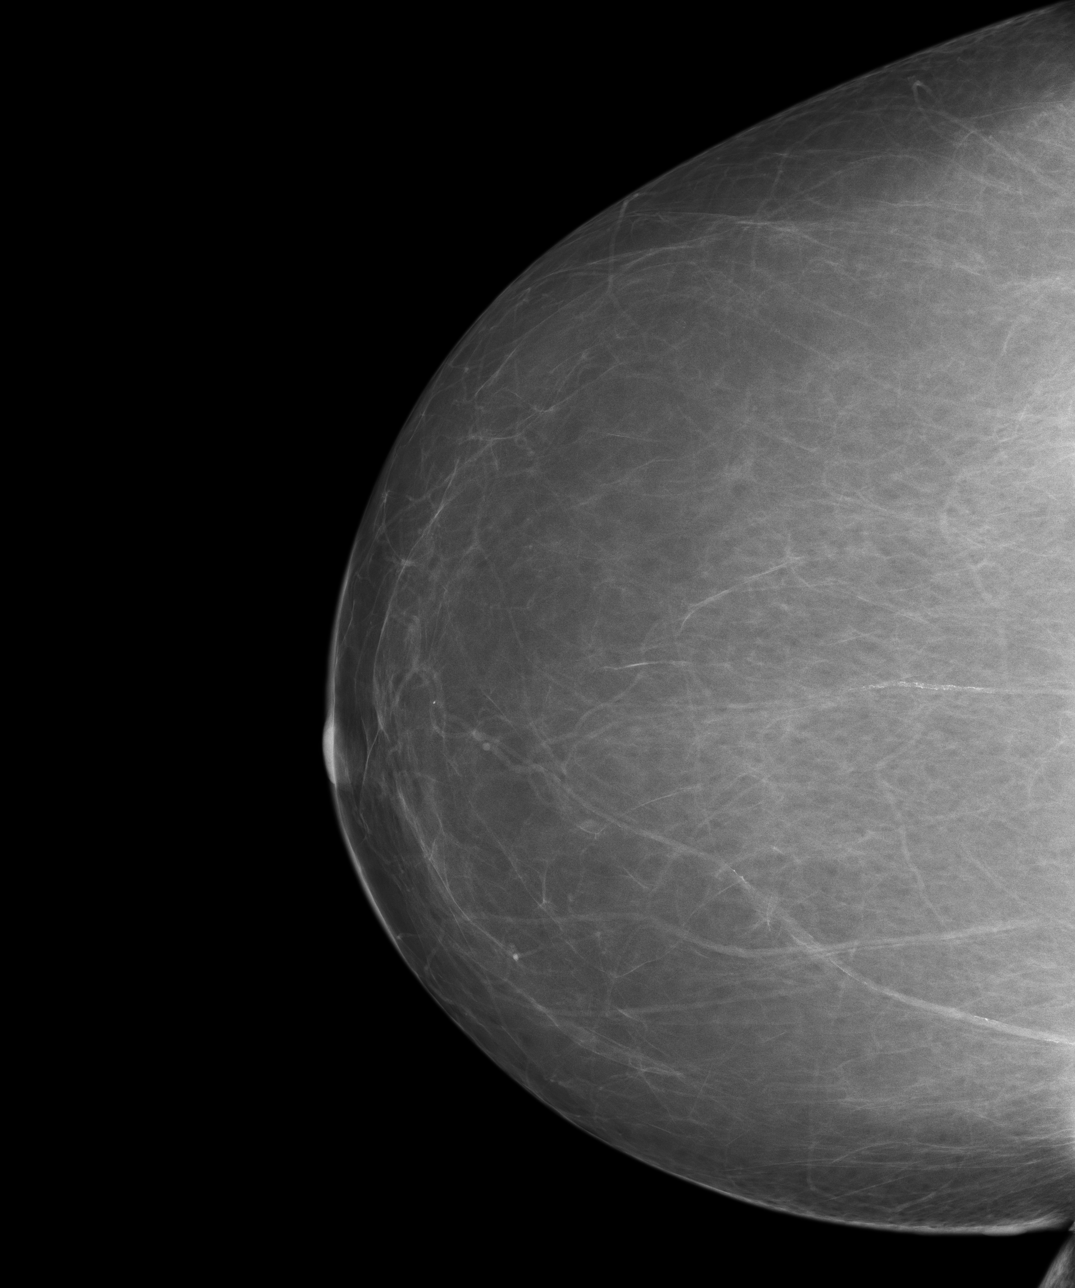
[im 2/4]
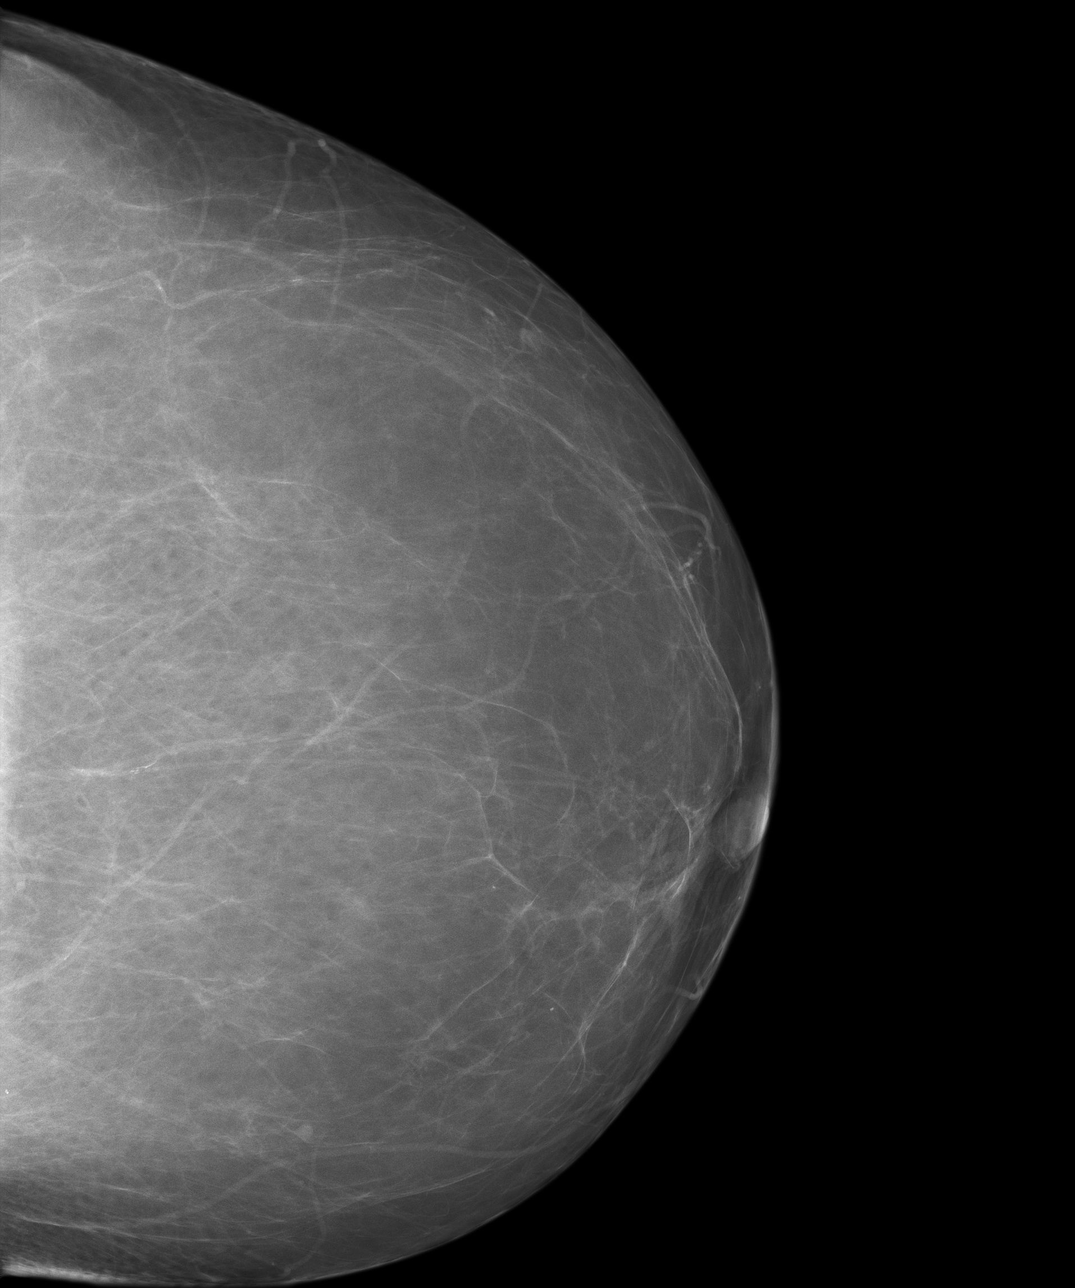
[im 3/4]
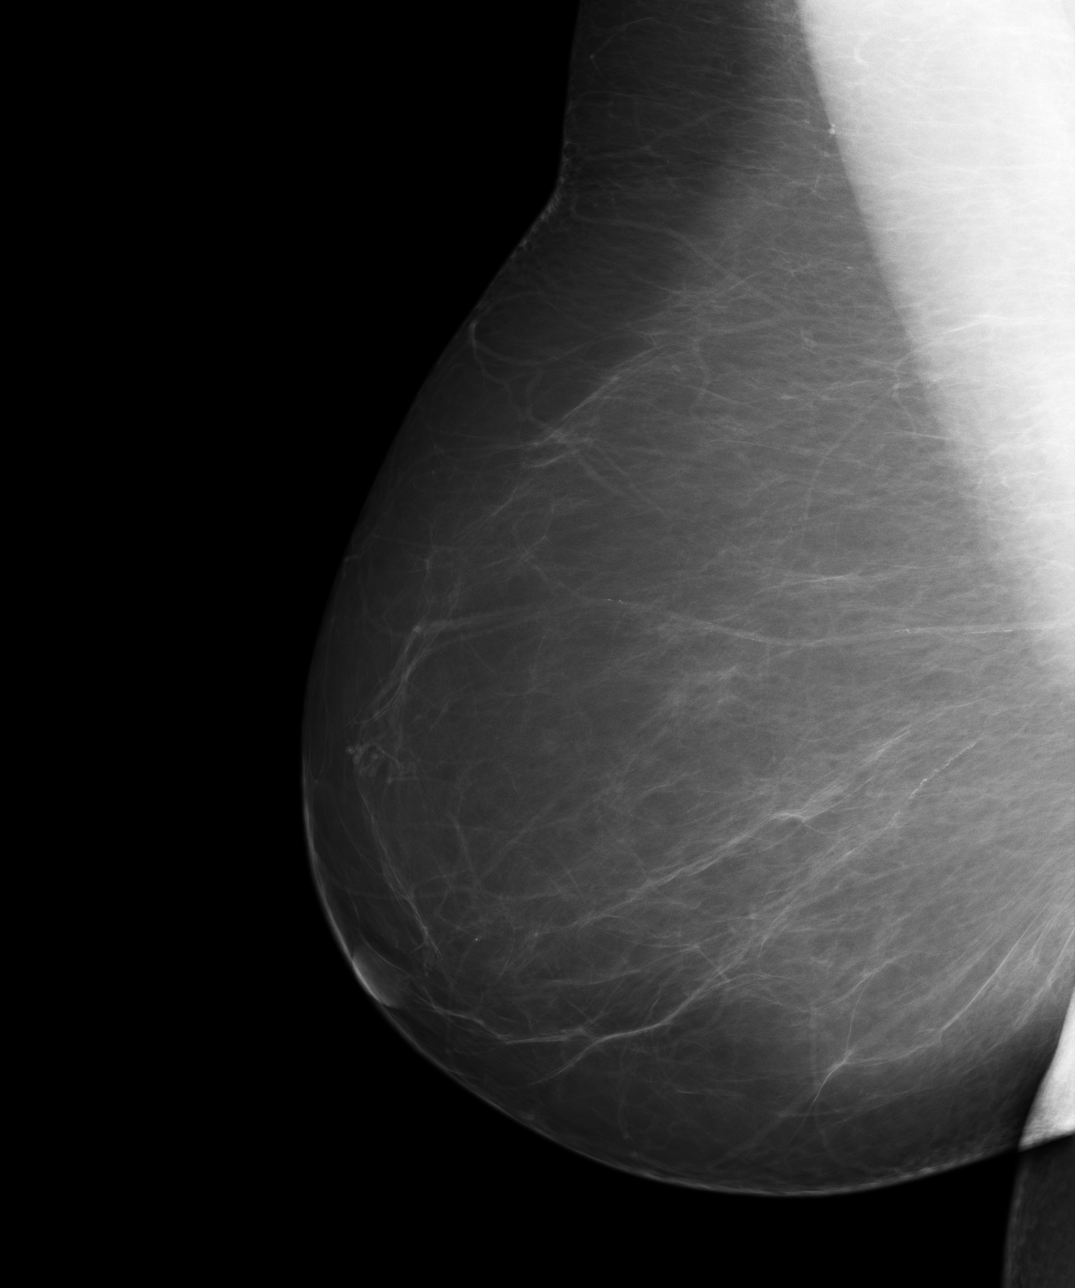
[im 4/4]
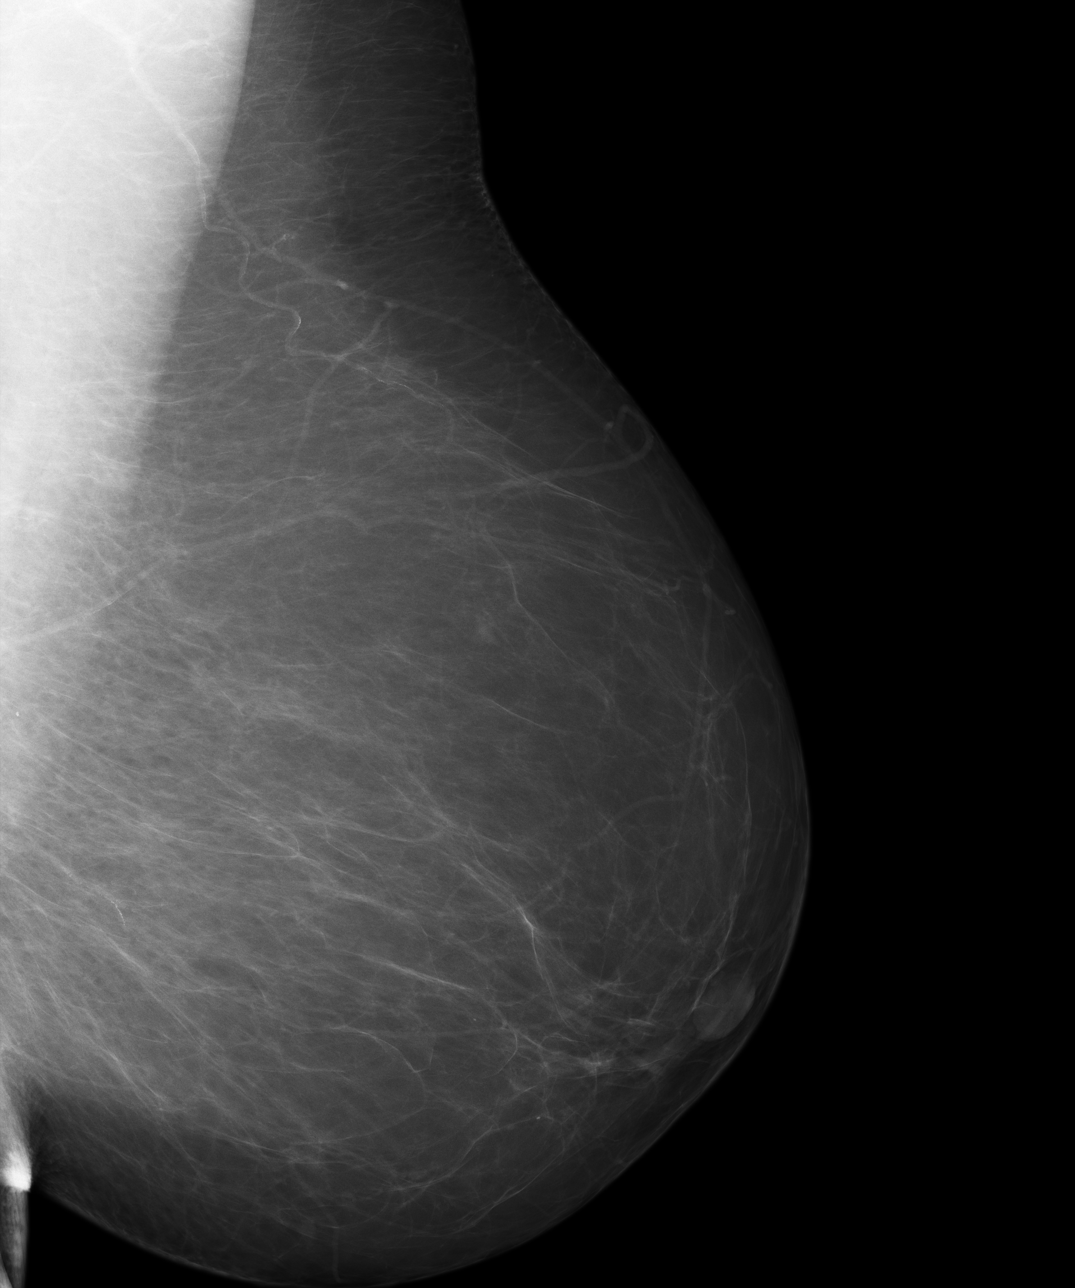

[4 of 4 positions shown; findings below may reference images not displayed]

IMPRESSION: BI-RADS:  Category 2- Benign Finding.

A negative mammogram report does not preclude biopsy or other evaluation of
a clinically palpable or otherwise suspicious mass or lesion.  Breast cancer
may not be detected by mammography in up to 10% of cases.

## 2009-11-12 ENCOUNTER — Ambulatory Visit: Payer: Self-pay | Admitting: Internal Medicine

## 2009-11-12 IMAGING — MG MM CAD SCREENING MAMMO
1 series · 5 of 5 positions shown · non-contrast
Comparison: none

REASON FOR EXAM: screeening
COMMENTS:

PROCEDURE:     MAM - MAM DGTL SCREENING MAMMO W/CAD  - November 12, 2009  [DATE]
RESULT:     No dominant masses or pathologic clustered calcifications are
demonstrated.  The exam is stable from prior exams. CAD evaluation is
non-focal.

[Series 9380: R CC · right · 5 of 5 slices shown]
[im 1/5]
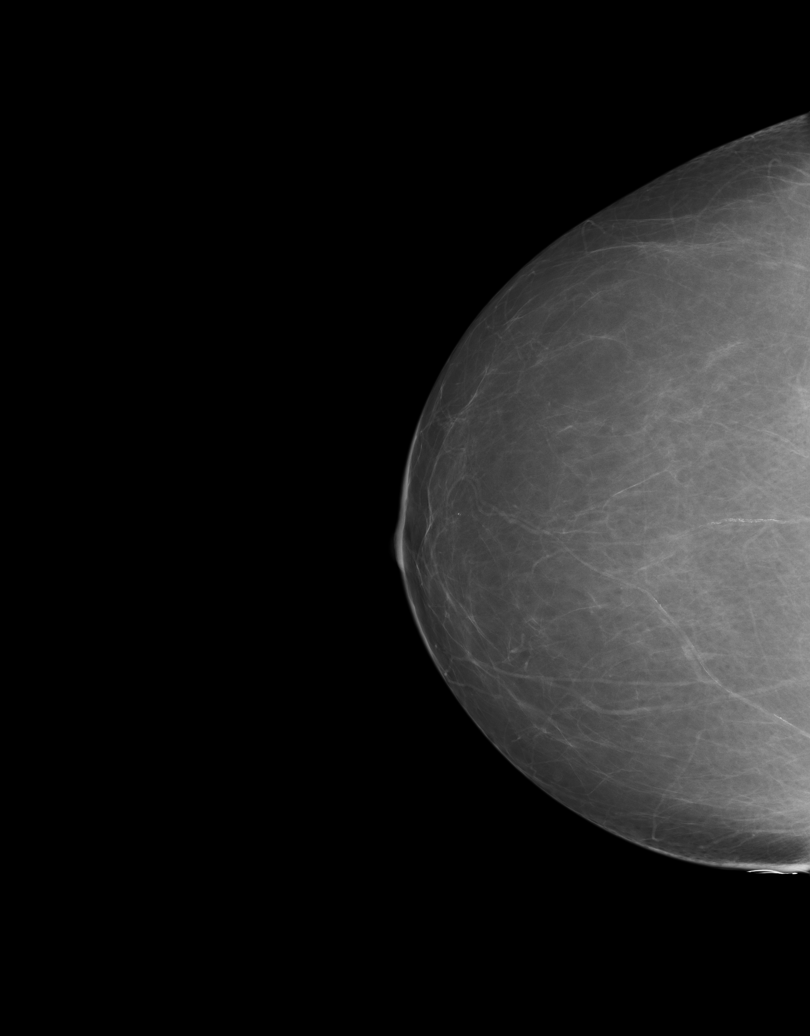
[im 2/5]
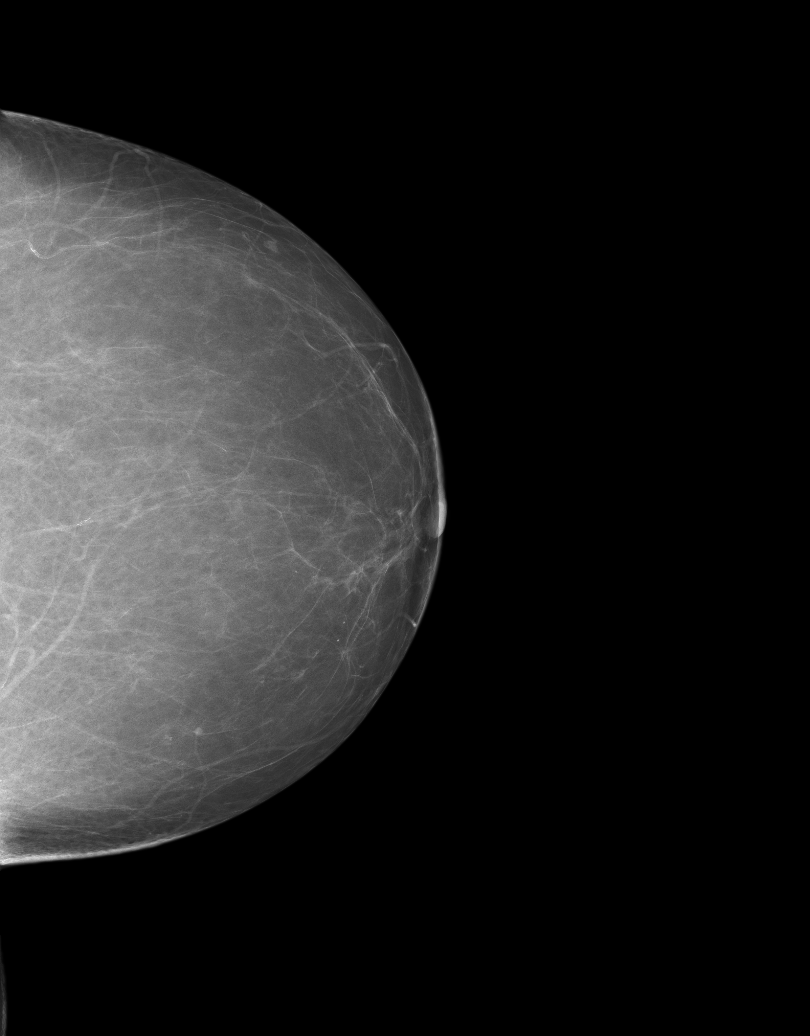
[im 3/5]
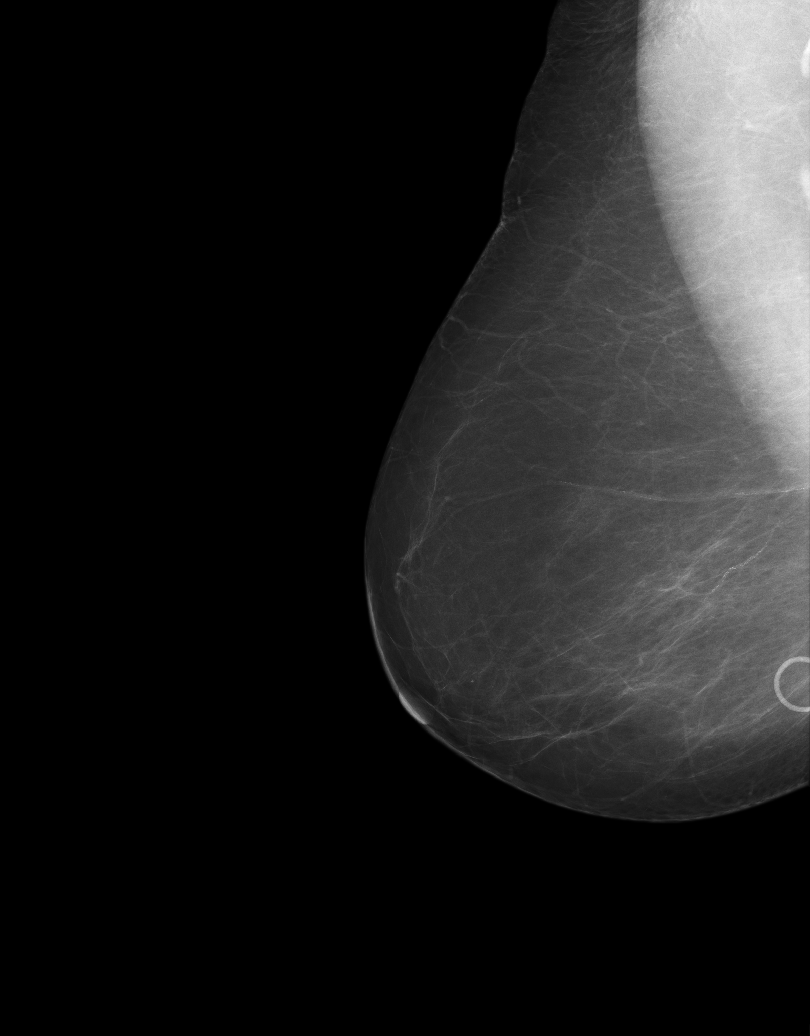
[im 4/5]
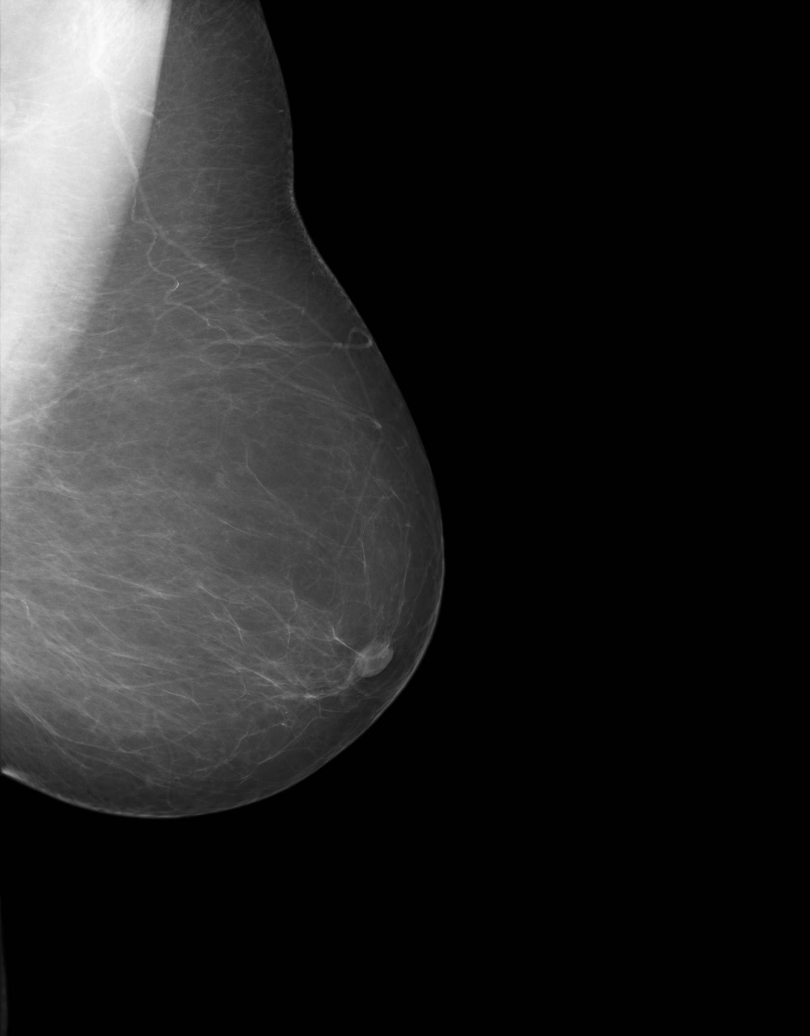
[im 5/5]
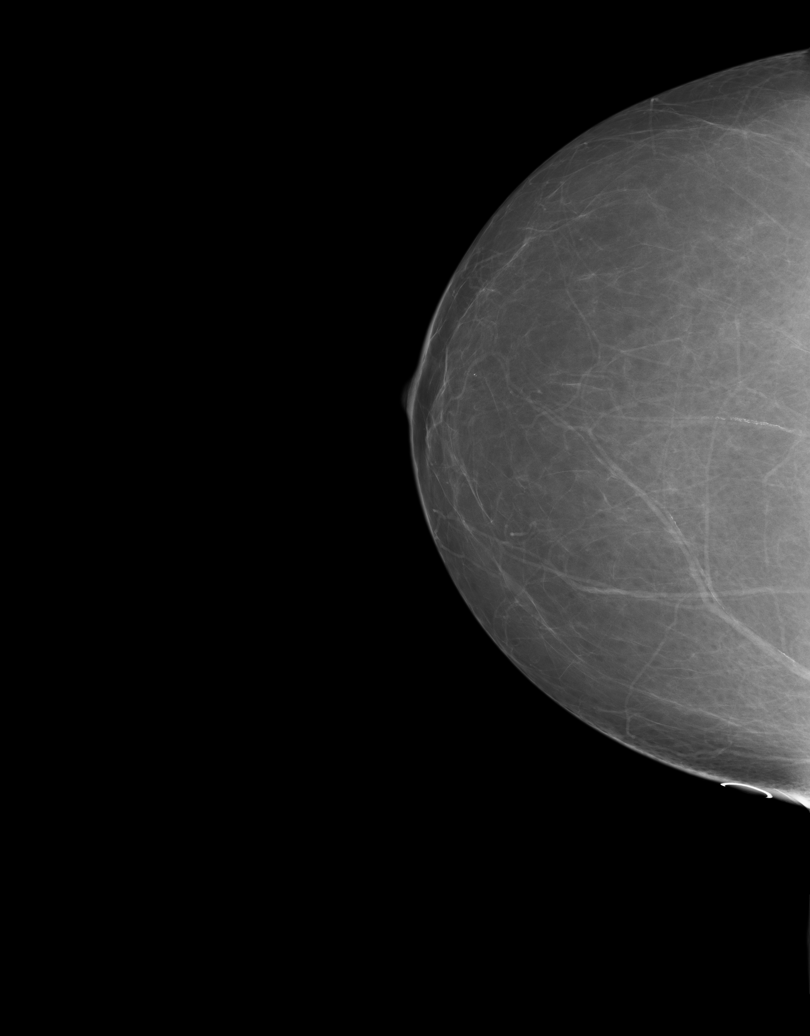

[5 of 5 positions shown; findings below may reference images not displayed]

IMPRESSION: 1.Benign exam. Routine yearly follow-up mammogram suggested.

BI-RADS: Category 2 - Benign Findings

A NEGATIVE MAMMOGRAM REPORT DOES NOT PRECLUDE BIOPSY OR OTHER EVALUATION OF
A CLINICALLY PALPABLE OR OTHERWISE SUSPICIOUS MASS OR LESION. BREAST CANCER
MAY NOT BE DETECTED BY MAMMOGRAPHY IN UP TO 10% OF CASES.

## 2010-11-16 ENCOUNTER — Ambulatory Visit: Payer: Self-pay | Admitting: Internal Medicine

## 2010-11-16 IMAGING — MG MM CAD SCREENING MAMMO
1 series · 4 of 4 positions shown · non-contrast
Comparison: none

REASON FOR EXAM: SCR
COMMENTS:

PROCEDURE:     MAM - MAM DGTL SCREENING MAMMO W/CAD  - November 16, 2010  [DATE]
RESULT:     COMPARISON:  10/26/2005, 11/06/2008
TECHNIQUE: Digital screening mammograms were obtained. FDA approved
computer-aided detection (CAD) for mammography was utilized for this study.

[Series 3022: R CC · right · 4 of 4 slices shown]
[im 1/4]
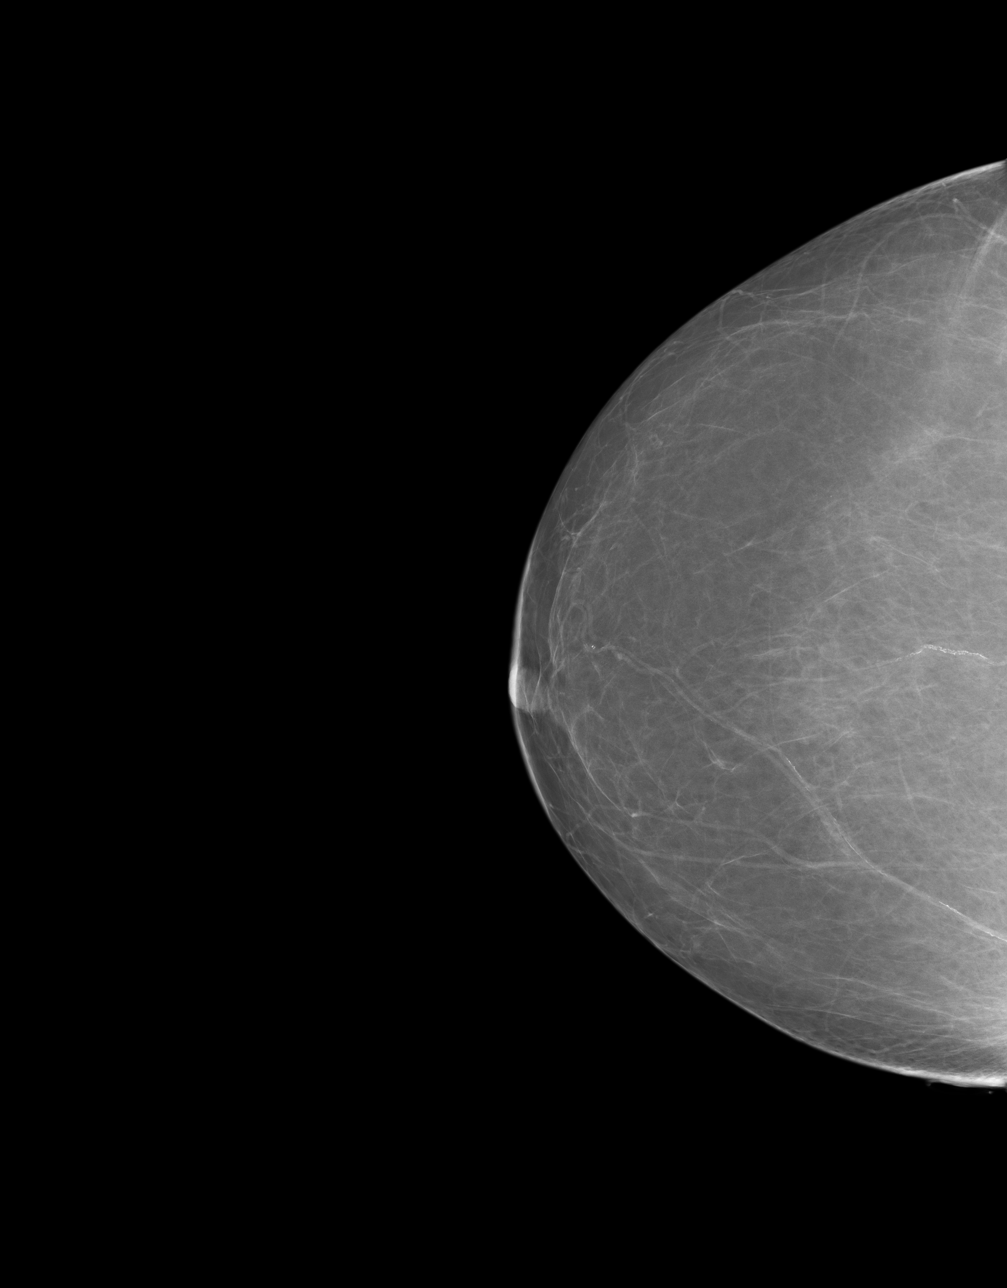
[im 2/4]
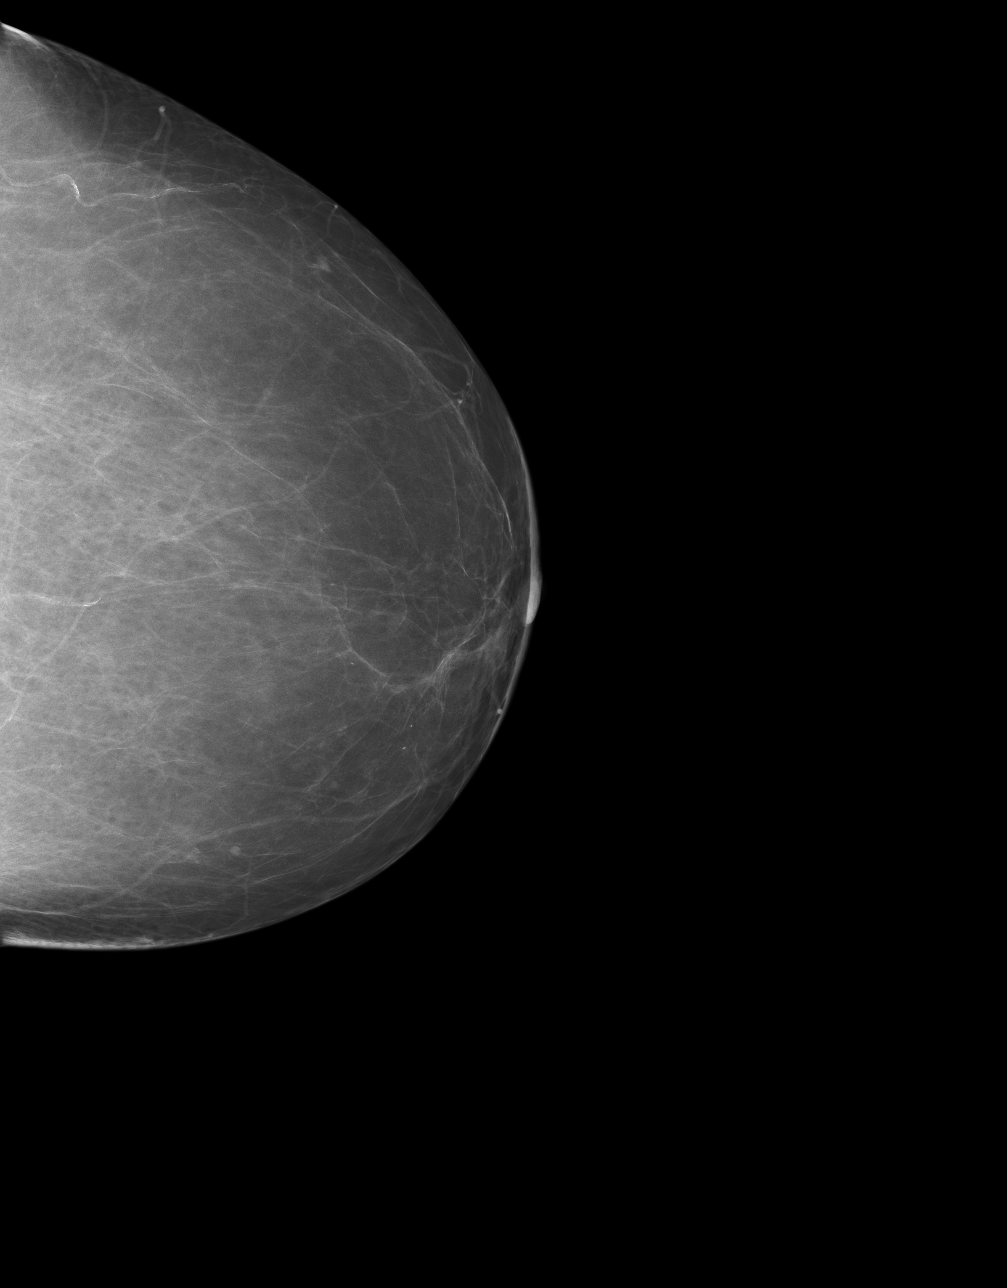
[im 3/4]
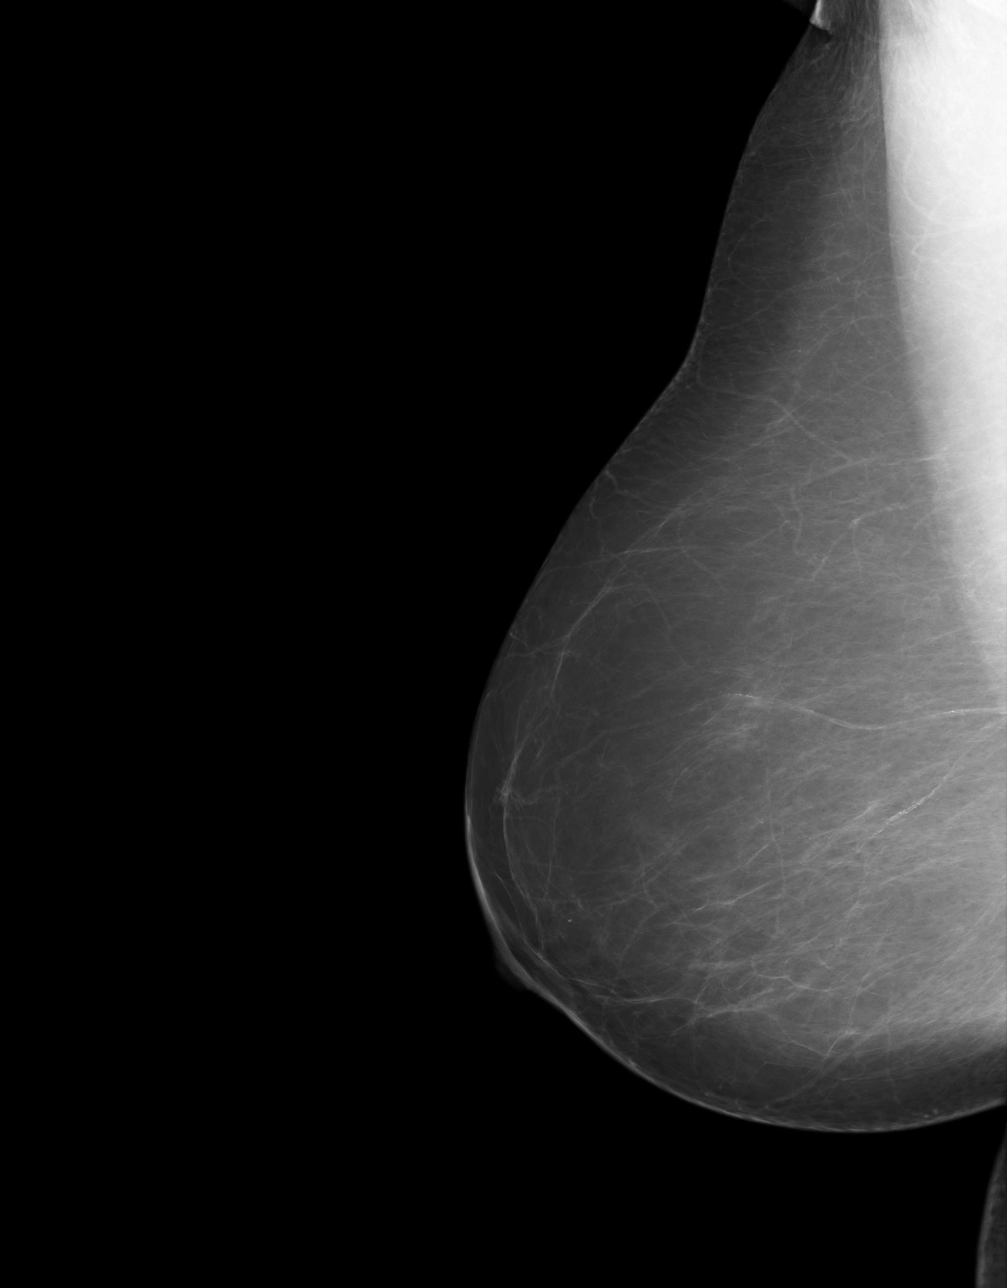
[im 4/4]
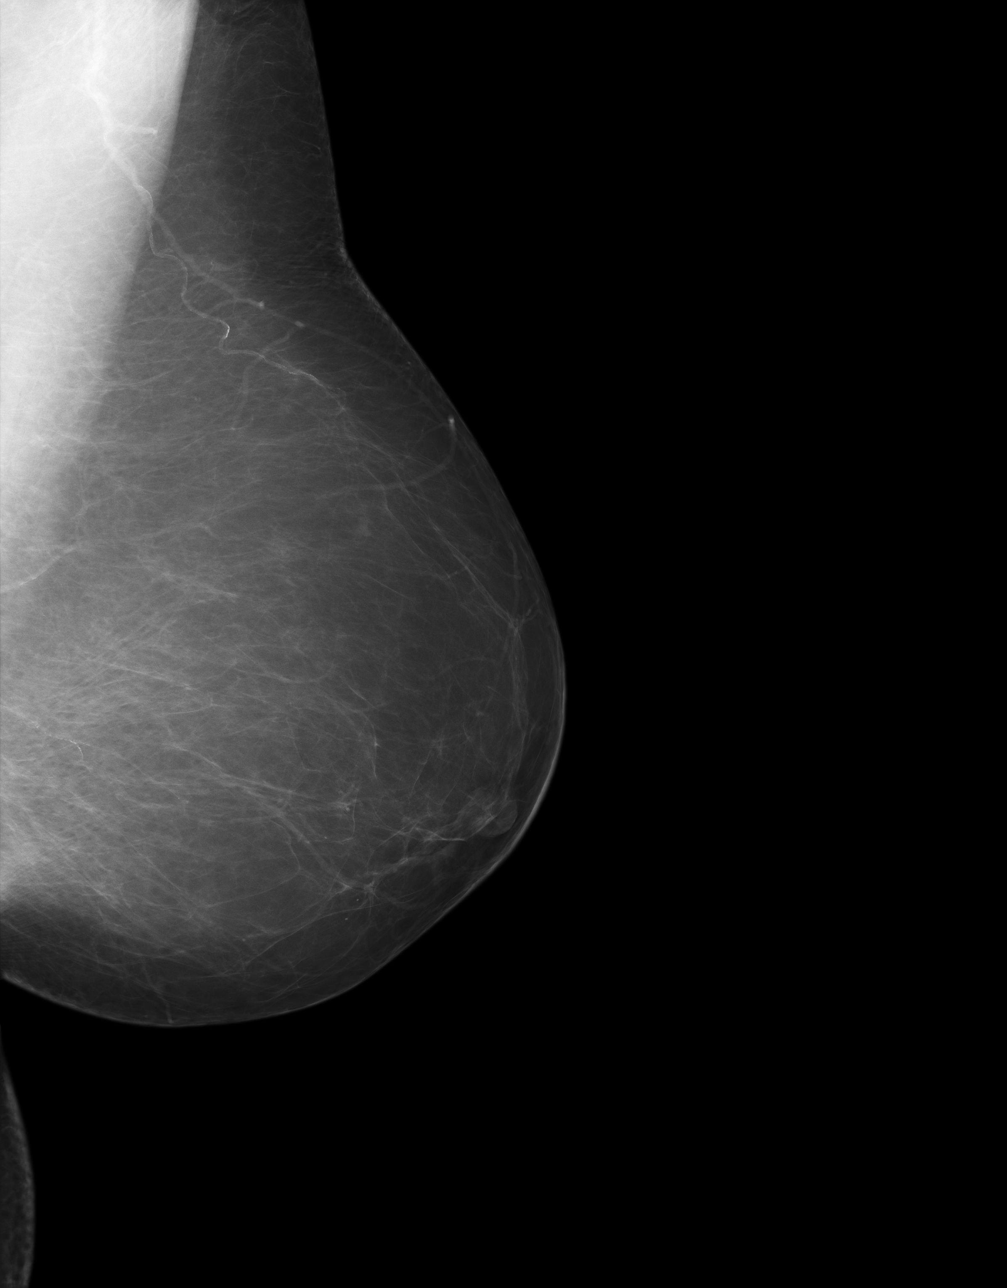

[4 of 4 positions shown; findings below may reference images not displayed]

FINDING: Bilateral breasts demonstrate a scattered fibroglandular density. There is
no dominant mass, architectural distortion or clusters of suspicious
microcalcifications.
IMPRESSION: 1.     Stable bilateral mammogram.
2.     Annual mammographic follow up recommended.
3.     BI-RADS:  Category 2- Benign.

A negative mammogram report does not preclude biopsy or other evaluation of
a clinically palpable or otherwise suspicious mass or lesion. Breast cancer
may not be detected by mammography in up to 10% of cases.

## 2011-11-17 ENCOUNTER — Ambulatory Visit: Payer: Self-pay | Admitting: Internal Medicine

## 2011-11-17 IMAGING — MG MM CAD SCREENING MAMMO
1 series · 4 of 4 positions shown · non-contrast
Comparison: none

REASON FOR EXAM: scr mammo no order
COMMENTS:

PROCEDURE:     MAM - MAM DGTL SCRN MAM NO ORDER W/CAD  - November 17, 2011 [DATE]
RESULT:       Comparison is made to prior studies dated 11/16/2010 and
11/12/2009.
The breasts are primarily fatty involuted.  There is no radiographic
evidence to suggest malignancy.

[R CC · right · 4 of 4 slices shown]
[im 1/4]
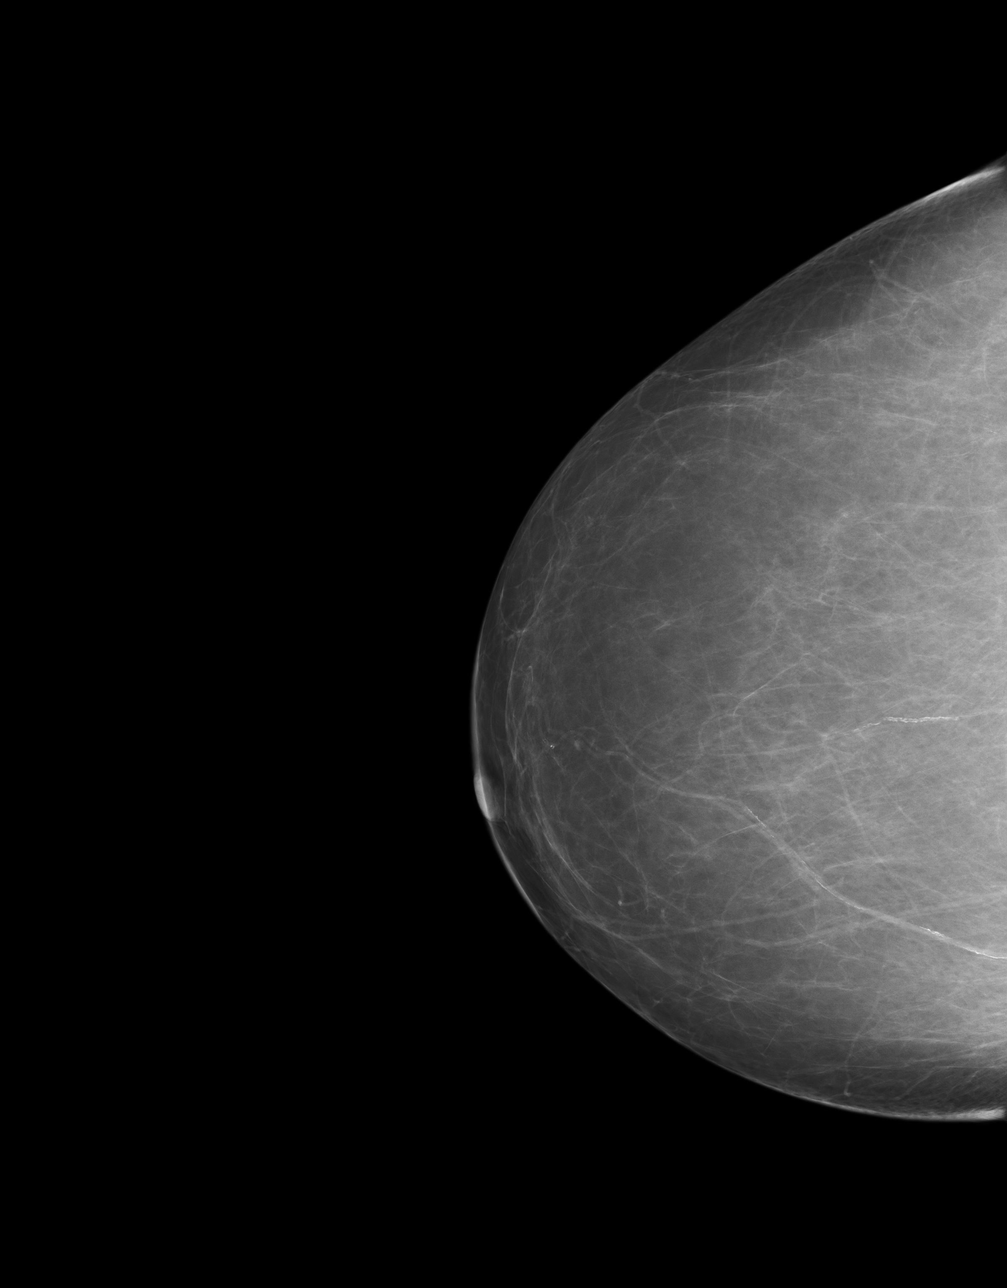
[im 2/4]
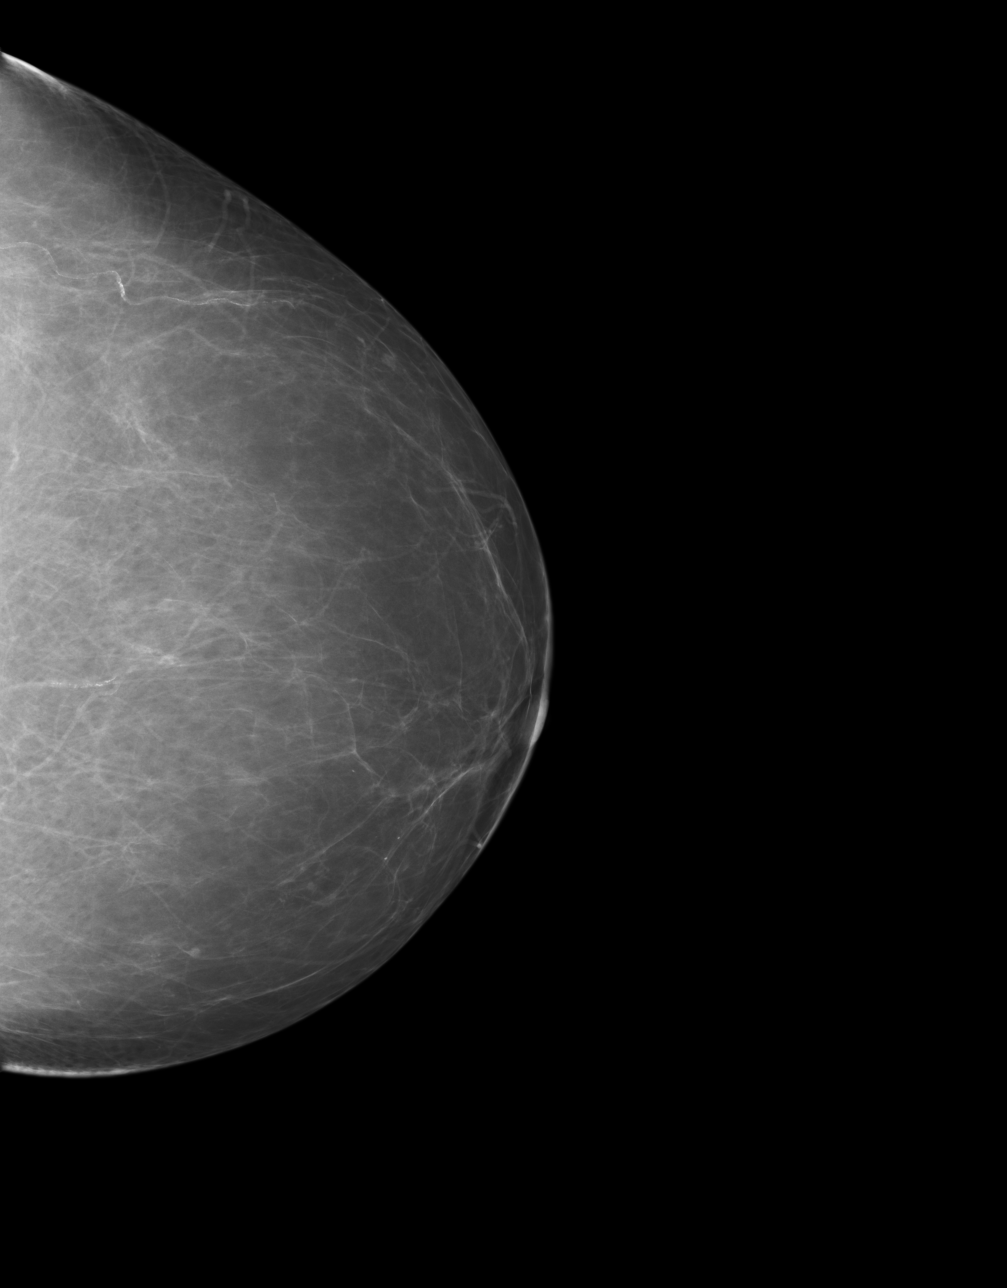
[im 3/4]
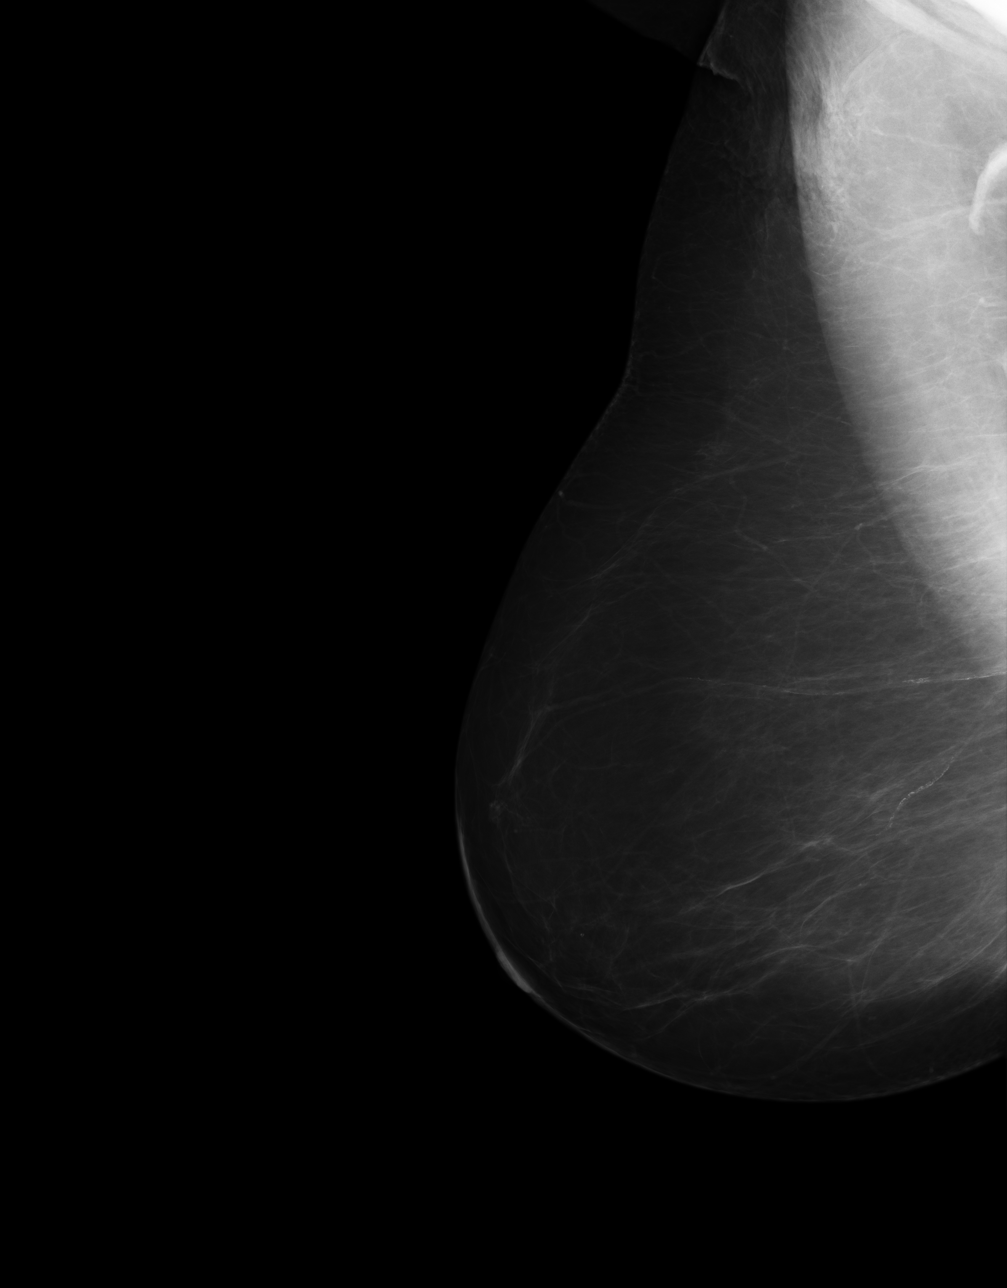
[im 4/4]
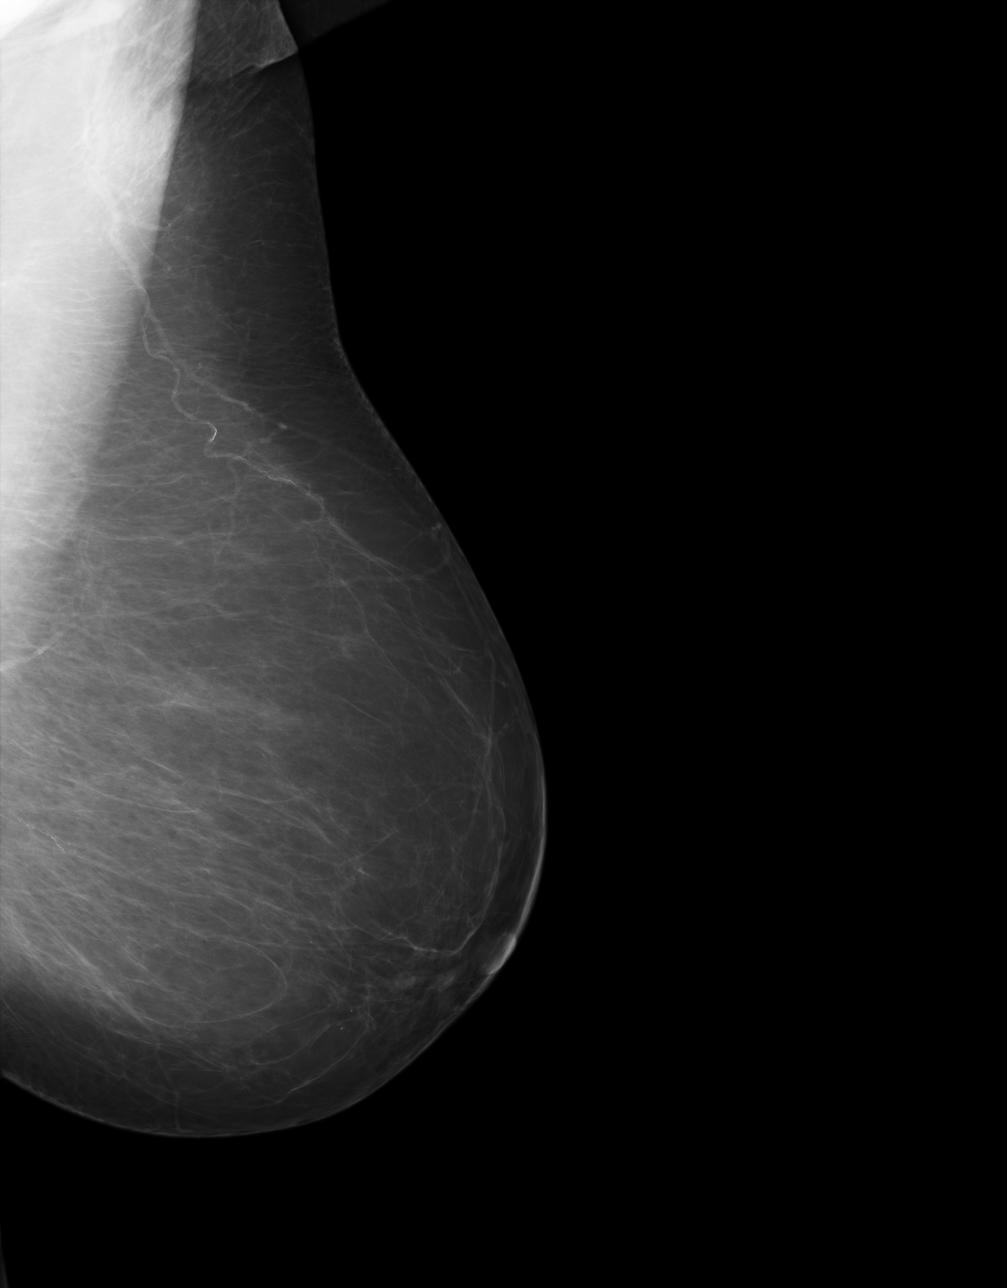

[4 of 4 positions shown; findings below may reference images not displayed]

IMPRESSION: BI-RADS: Category 2- Benign Finding.

Thank you for the opportunity to contribute to the care of your patient.

A NEGATIVE MAMMOGRAM REPORT DOES NOT PRECLUDE BIOPSY OR OTHER EVALUATION OF
A CLINICALLY PALPABLE OR OTHERWISE SUSPICIOUS MASS OR LESION.  BREAST CANCER
MAY NOT BE DETECTED BY MAMMOGRAPHY IN UP TO 10% OF CASES.

## 2012-11-28 ENCOUNTER — Ambulatory Visit: Payer: Self-pay | Admitting: Internal Medicine

## 2012-11-28 IMAGING — MG MM CAD SCREENING MAMMO
1 series · 4 of 4 positions shown · non-contrast
Comparison: none

REASON FOR EXAM: SCR MAMMO NO ORDER
COMMENTS:

[R CC · right · 4 of 4 slices shown]
[im 1/4]
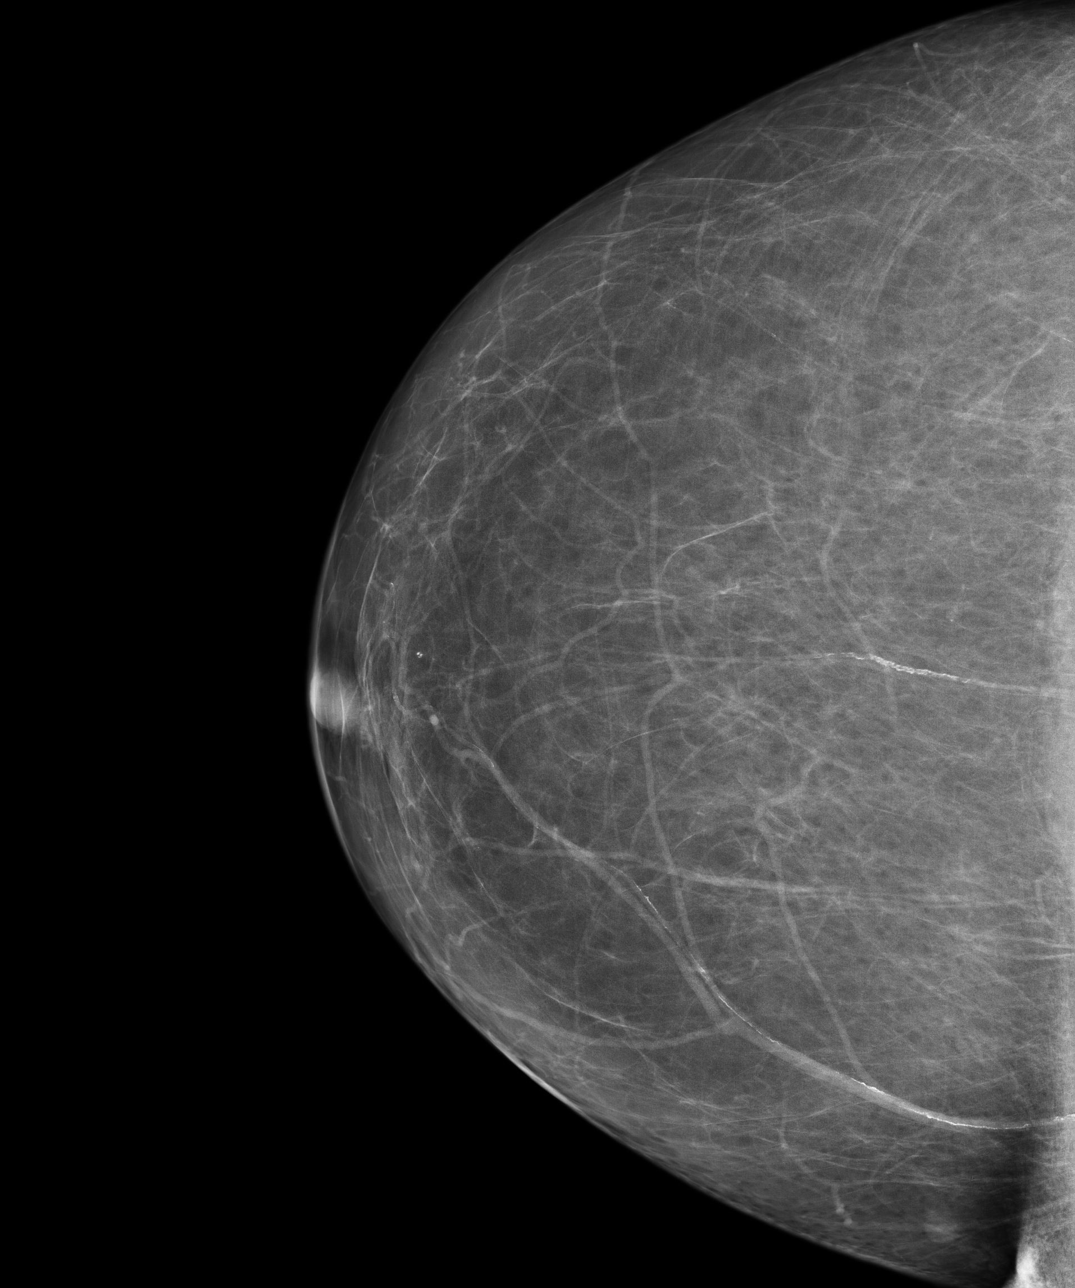
[im 2/4]
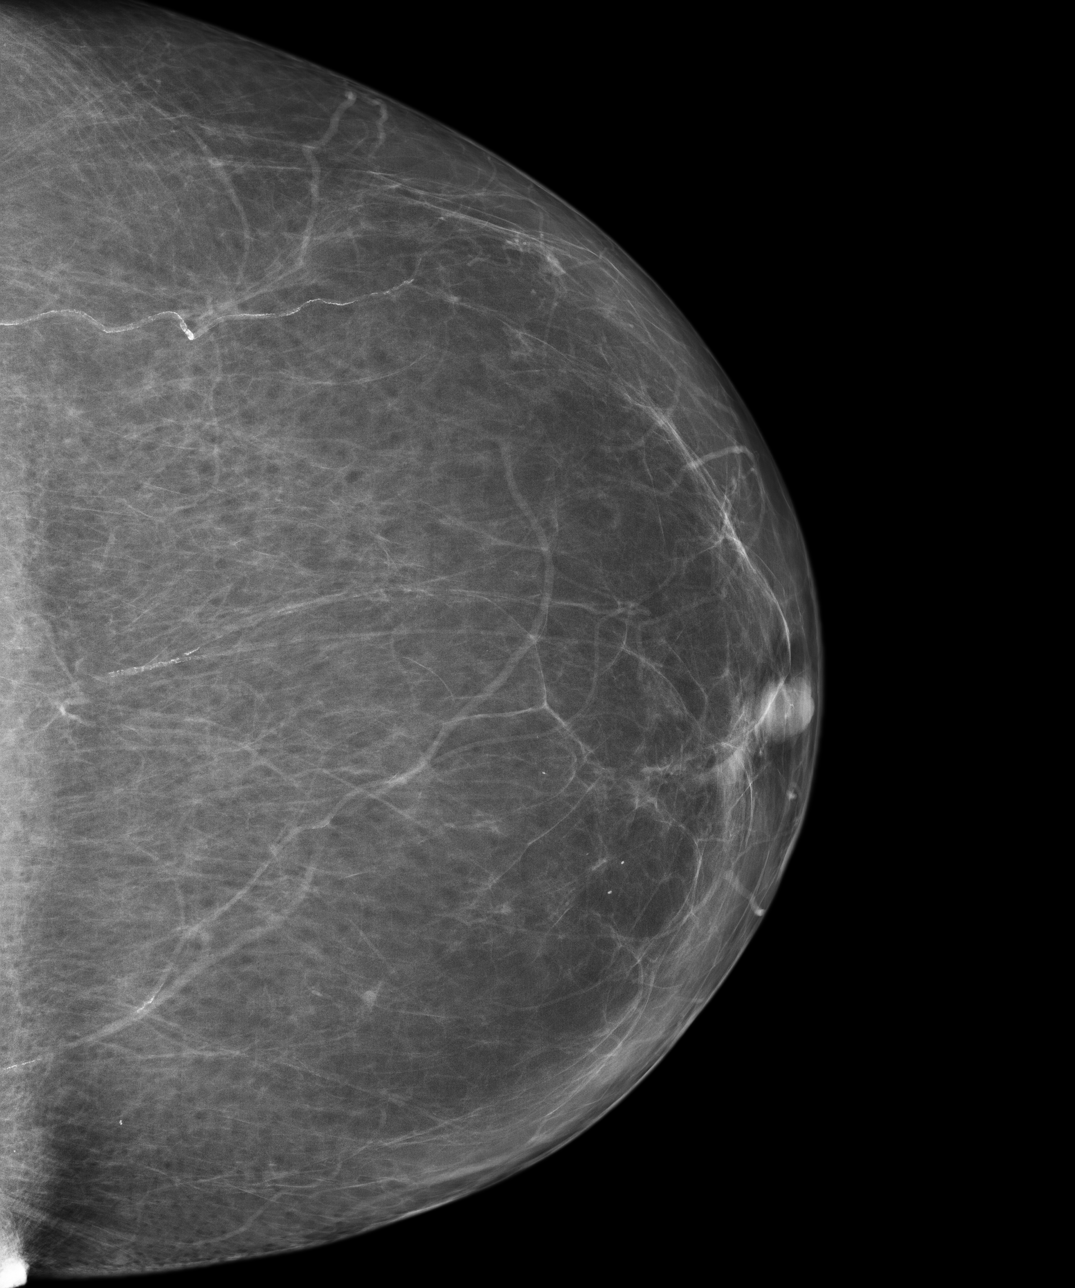
[im 3/4]
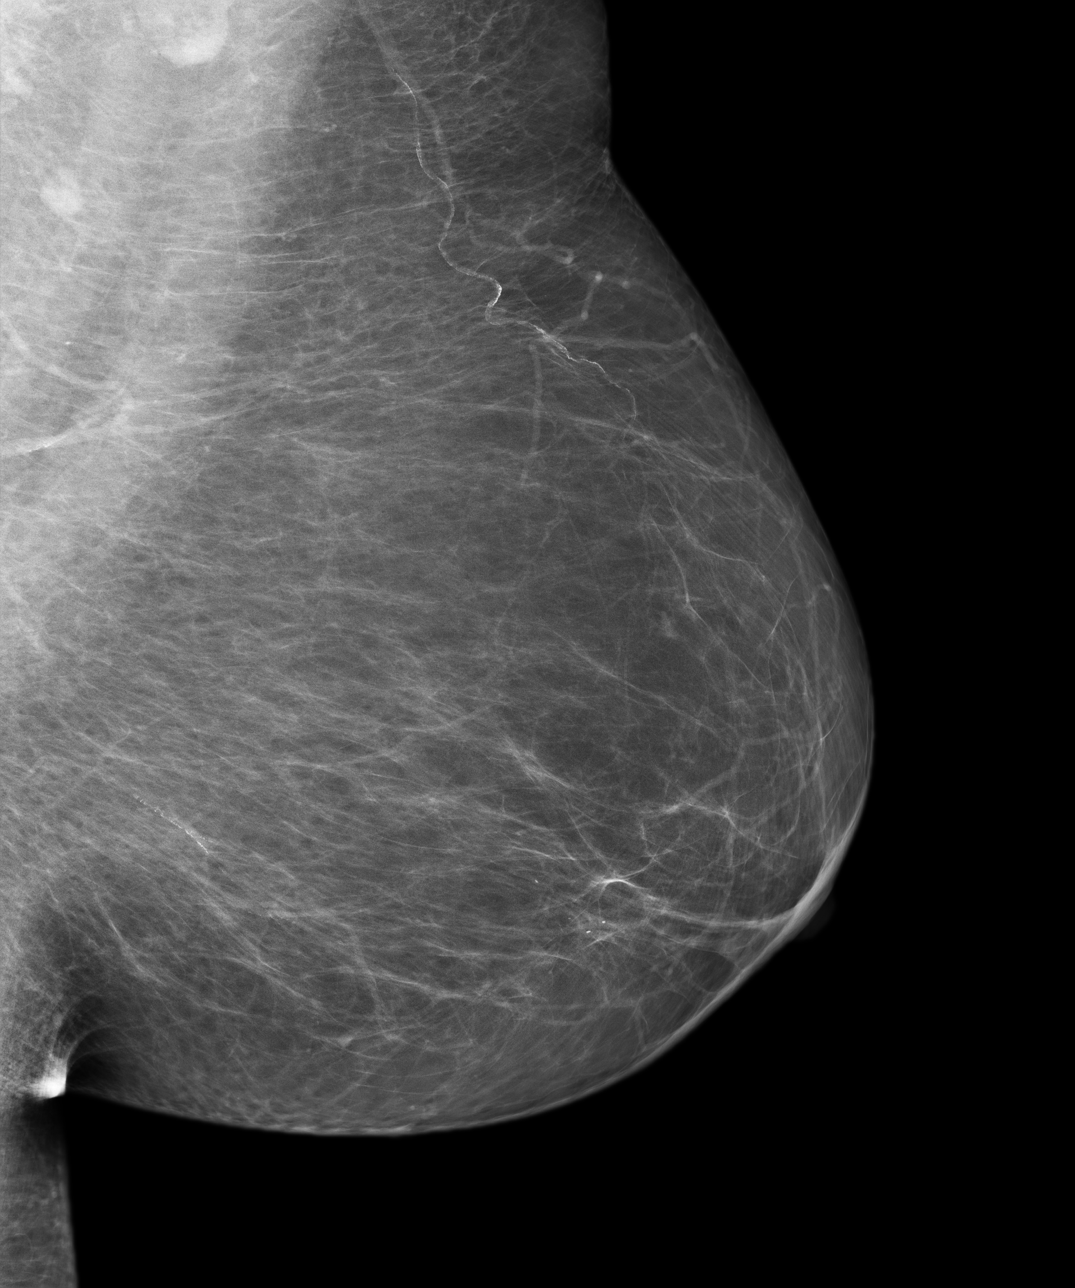
[im 4/4]
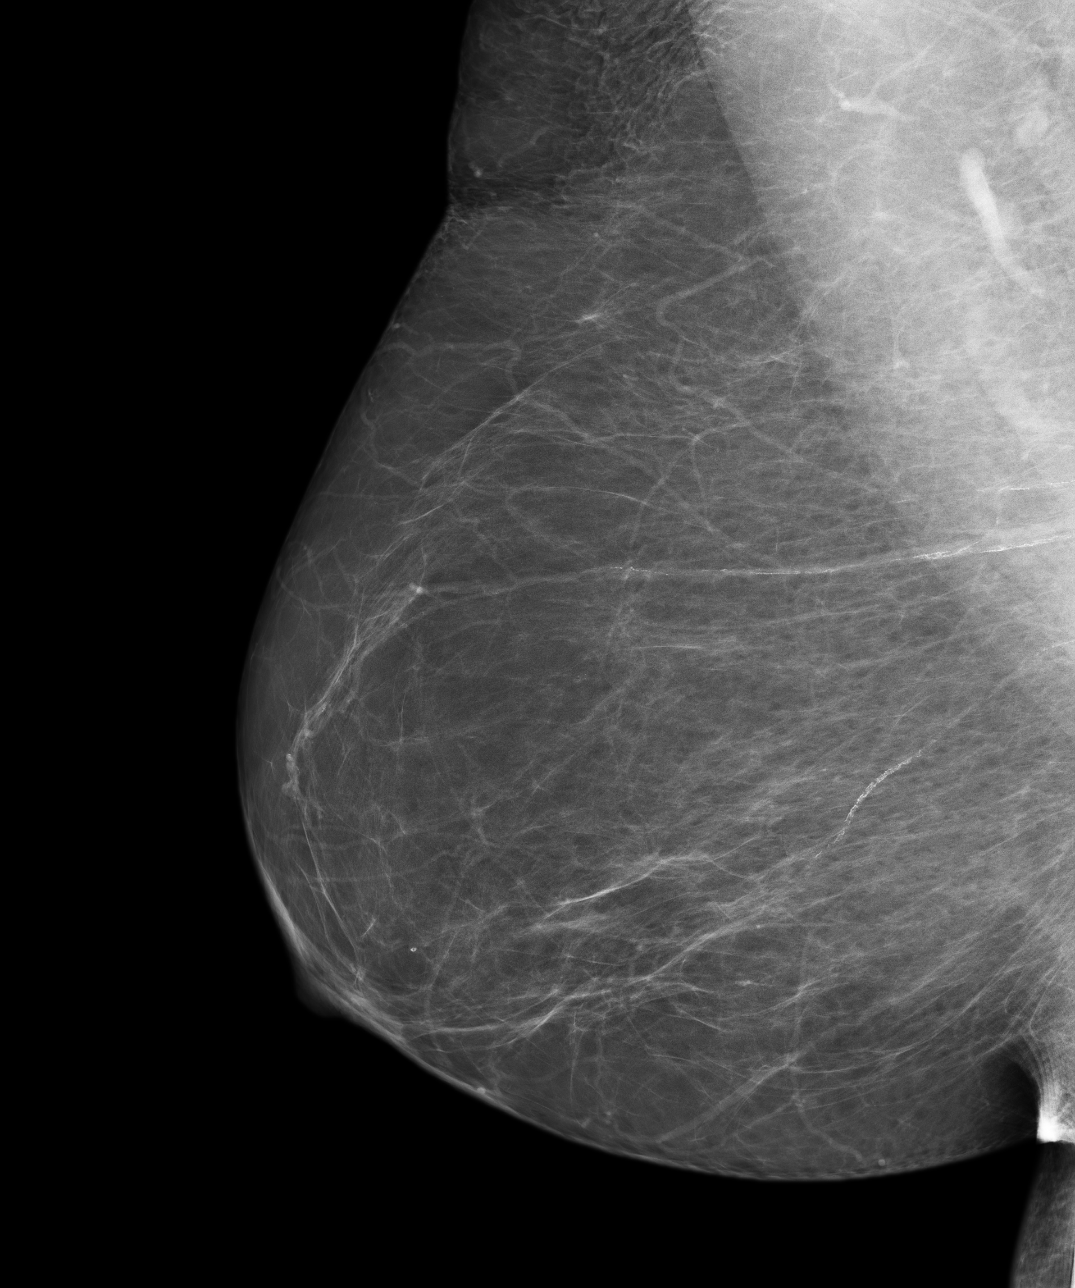

[4 of 4 positions shown; findings below may reference images not displayed]

PROCEDURE:     MAM - MAM DGTL SCRN MAM NO ORDER W/CAD  - November 28, 2012  [DATE]

RESULT:     Comparison is made to previous digital studies November 17, 2011,November 16, 2010, and November 06, 2007.

The breasts exhibit a fatty replaced parenchymal pattern. There are
benign-appearing lymph nodes in the axillary regions. There is no dominant
mass and there are no malignant appearing groupings of microcalcification.
There are a few singlet and loosely grouped microcalcifications bilaterally
and there are vascular calcifications bilaterally.
IMPRESSION: There are no findings suspicious for malignancy.

BI-RADS 2: Benign findings.

Recommendation: Please continue to encourage yearly mammographic followup.

BREAST COMPOSITION: The breast composition is ALMOST ENTIRELY FATTY
(glandular tissue is less than 25%)

A NEGATIVE MAMMOGRAM REPORT DOES NOT PRECLUDE BIOPSY OR OTHER EVALUATION OF
A CLINICALLY PALPABLE OR OTHERWISE SUSPICIOUS MASS OR LESION. BREAST CANCER
MAY NOT BE DETECTED BY MAMMOGRAPHY IN UP TO 10% OF CASES.

Dictation site:1

## 2014-01-25 ENCOUNTER — Ambulatory Visit: Payer: Self-pay | Admitting: Internal Medicine

## 2014-01-25 IMAGING — MG MM DIGITAL SCREENING BILAT W/ CAD
1 series · 4 of 4 positions shown · non-contrast
Comparison: Previous exam(s).

CLINICAL DATA: Screening.

EXAM:
DIGITAL SCREENING BILATERAL MAMMOGRAM WITH CAD

[R CC · right · 4 of 4 slices shown]
[im 1/4]
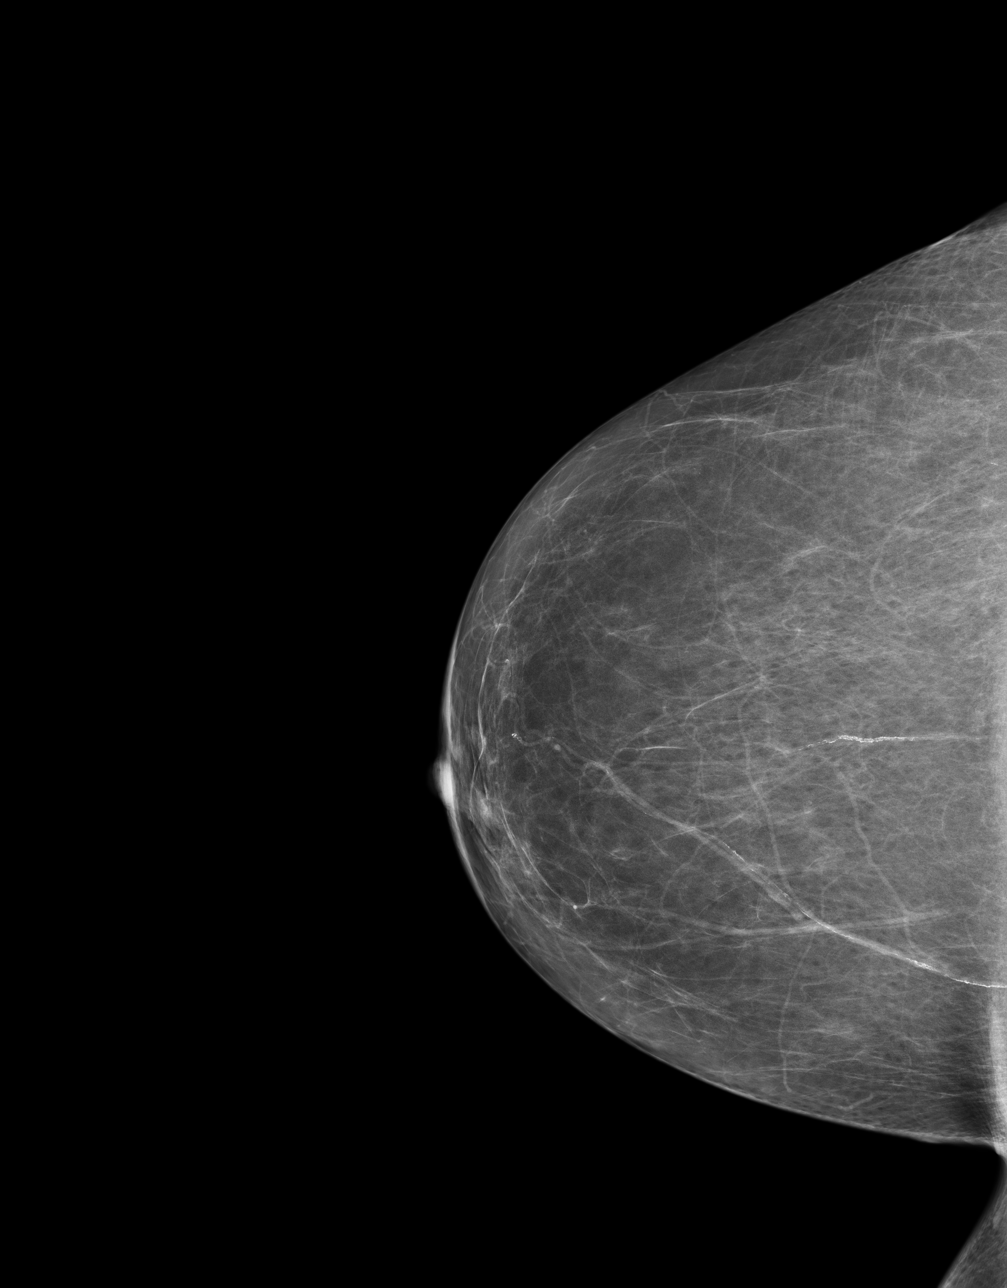
[im 2/4]
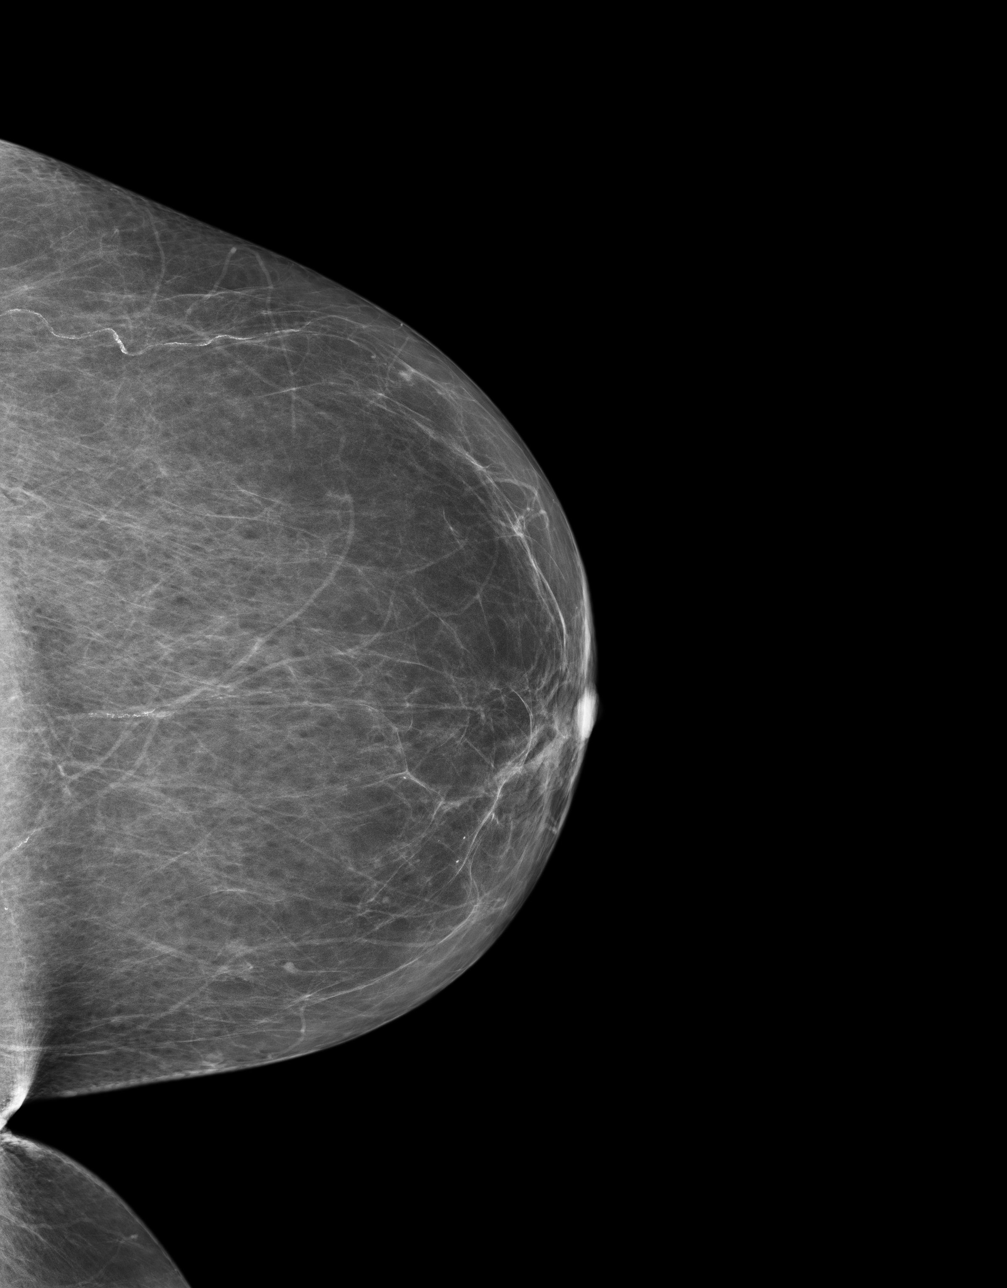
[im 3/4]
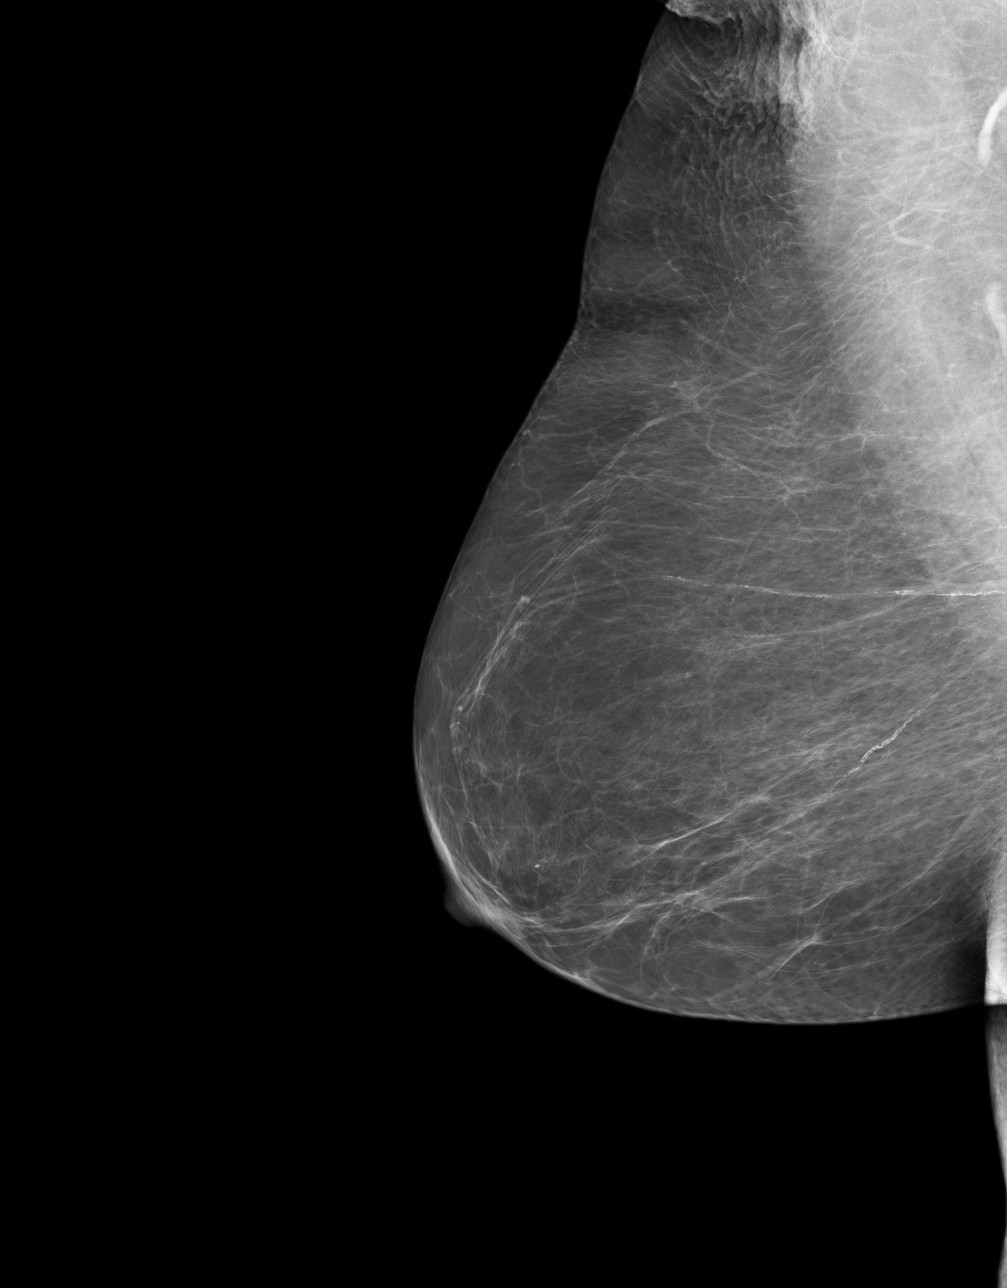
[im 4/4]
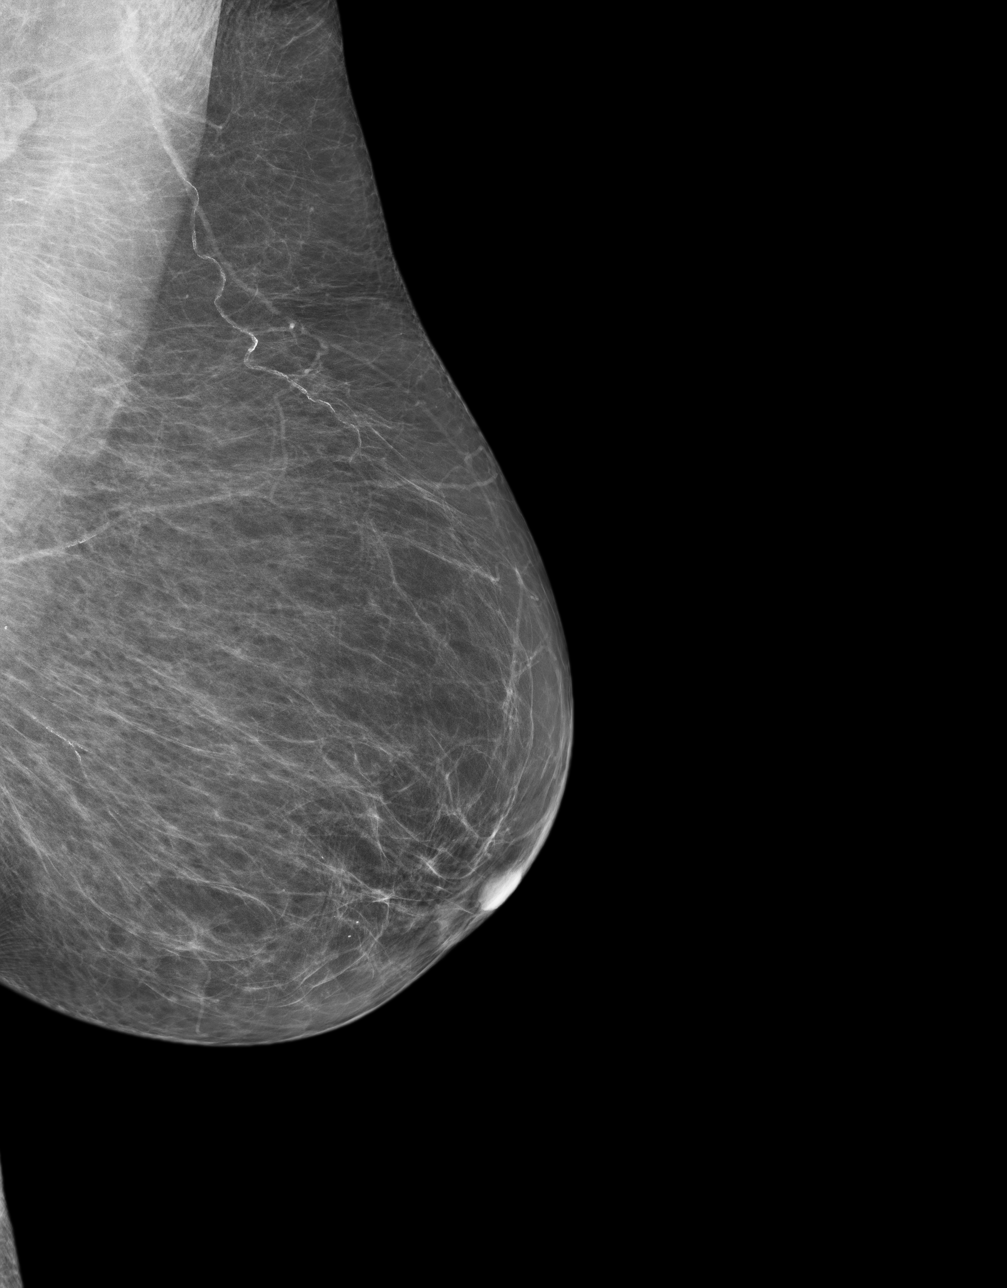

[4 of 4 positions shown; findings below may reference images not displayed]

ACR Breast Density Category b: There are scattered areas of
fibroglandular density.
FINDINGS: There are no findings suspicious for malignancy. Images were
processed with CAD.
IMPRESSION: No mammographic evidence of malignancy. A result letter of this
screening mammogram will be mailed directly to the patient.

RECOMMENDATION:
Screening mammogram in one year. (Code:AS-G-LCT)

BI-RADS CATEGORY  1: Negative.

## 2014-12-05 ENCOUNTER — Other Ambulatory Visit
Admission: RE | Admit: 2014-12-05 | Discharge: 2014-12-05 | Disposition: A | Discharge status: Home / Self Care | Payer: Medicare Other | Attending: Podiatry | Admitting: Podiatry

## 2014-12-05 DIAGNOSIS — M109 Gout, unspecified: Secondary | ICD-10-CM | POA: Diagnosis present

## 2014-12-05 LAB — SYNOVIAL FLUID, CRYSTAL

## 2015-06-19 ENCOUNTER — Encounter: Payer: Self-pay | Admitting: *Deleted

## 2015-06-22 NOTE — H&P (Signed)
See scanned note.

## 2015-06-23 ENCOUNTER — Encounter: Payer: Self-pay | Admitting: *Deleted

## 2015-06-23 ENCOUNTER — Ambulatory Visit: Payer: Medicare HMO | Admitting: Certified Registered Nurse Anesthetist

## 2015-06-23 ENCOUNTER — Ambulatory Visit
Admission: RE | Admit: 2015-06-23 | Discharge: 2015-06-23 | Disposition: A | Discharge status: Home / Self Care | Payer: Medicare HMO | Source: Ambulatory Visit | Attending: Ophthalmology | Admitting: Ophthalmology

## 2015-06-23 ENCOUNTER — Encounter
Admission: RE | Disposition: A | Discharge status: Home / Self Care | Payer: Self-pay | Source: Ambulatory Visit | Attending: Ophthalmology

## 2015-06-23 DIAGNOSIS — Z880 Allergy status to penicillin: Secondary | ICD-10-CM | POA: Diagnosis not present

## 2015-06-23 DIAGNOSIS — Z8711 Personal history of peptic ulcer disease: Secondary | ICD-10-CM | POA: Diagnosis not present

## 2015-06-23 DIAGNOSIS — Z91040 Latex allergy status: Secondary | ICD-10-CM | POA: Insufficient documentation

## 2015-06-23 DIAGNOSIS — H2511 Age-related nuclear cataract, right eye: Secondary | ICD-10-CM | POA: Insufficient documentation

## 2015-06-23 DIAGNOSIS — R05 Cough: Secondary | ICD-10-CM | POA: Insufficient documentation

## 2015-06-23 DIAGNOSIS — Z91048 Other nonmedicinal substance allergy status: Secondary | ICD-10-CM | POA: Insufficient documentation

## 2015-06-23 DIAGNOSIS — Z887 Allergy status to serum and vaccine status: Secondary | ICD-10-CM | POA: Diagnosis not present

## 2015-06-23 DIAGNOSIS — E039 Hypothyroidism, unspecified: Secondary | ICD-10-CM | POA: Diagnosis not present

## 2015-06-23 DIAGNOSIS — I10 Essential (primary) hypertension: Secondary | ICD-10-CM | POA: Insufficient documentation

## 2015-06-23 DIAGNOSIS — E119 Type 2 diabetes mellitus without complications: Secondary | ICD-10-CM | POA: Diagnosis not present

## 2015-06-23 DIAGNOSIS — M109 Gout, unspecified: Secondary | ICD-10-CM | POA: Insufficient documentation

## 2015-06-23 HISTORY — DX: Hypothyroidism, unspecified: E03.9

## 2015-06-23 HISTORY — DX: Gout, unspecified: M10.9

## 2015-06-23 HISTORY — DX: Anemia, unspecified: D64.9

## 2015-06-23 HISTORY — DX: Type 2 diabetes mellitus without complications: E11.9

## 2015-06-23 HISTORY — DX: Cough, unspecified: R05.9

## 2015-06-23 HISTORY — DX: Essential (primary) hypertension: I10

## 2015-06-23 HISTORY — PX: CATARACT EXTRACTION W/PHACO: SHX586

## 2015-06-23 HISTORY — DX: Cough: R05

## 2015-06-23 LAB — GLUCOSE, CAPILLARY: GLUCOSE-CAPILLARY: 153 mg/dL — AB (ref 65–99)

## 2015-06-23 SURGERY — PHACOEMULSIFICATION, CATARACT, WITH IOL INSERTION
Anesthesia: Monitor Anesthesia Care | Site: Eye | Laterality: Right | Wound class: Clean

## 2015-06-23 MED ORDER — EPINEPHRINE HCL 1 MG/ML IJ SOLN
INTRAOCULAR | Status: DC | PRN
  Administered 2015-06-23: 1 mL via OPHTHALMIC

## 2015-06-23 MED ORDER — PHENYLEPHRINE HCL 10 % OP SOLN
OPHTHALMIC | Status: AC
  Administered 2015-06-23: 1 [drp] via OPHTHALMIC
  Filled 2015-06-23: qty 5

## 2015-06-23 MED ORDER — SODIUM CHLORIDE 0.9 % IV SOLN
INTRAVENOUS | Status: DC
  Administered 2015-06-23: 50 mL/h via INTRAVENOUS

## 2015-06-23 MED ORDER — PHENYLEPHRINE HCL 10 % OP SOLN
1.0000 [drp] | OPHTHALMIC | Status: DC | PRN
  Administered 2015-06-23 (×3): 1 [drp] via OPHTHALMIC

## 2015-06-23 MED ORDER — MOXIFLOXACIN HCL 0.5 % OP SOLN
OPHTHALMIC | Status: DC | PRN
Start: 2015-06-23 — End: 2015-06-23
  Administered 2015-06-23: 1 [drp] via OPHTHALMIC

## 2015-06-23 MED ORDER — CYCLOPENTOLATE HCL 2 % OP SOLN
OPHTHALMIC | Status: AC
  Administered 2015-06-23: 1 [drp] via OPHTHALMIC
  Filled 2015-06-23: qty 2

## 2015-06-23 MED ORDER — TETRACAINE HCL 0.5 % OP SOLN
OPHTHALMIC | Status: DC | PRN
  Administered 2015-06-23: 1 [drp] via OPHTHALMIC

## 2015-06-23 MED ORDER — MOXIFLOXACIN HCL 0.5 % OP SOLN
1.0000 [drp] | OPHTHALMIC | Status: AC | PRN
  Administered 2015-06-23 (×3): 1 [drp] via OPHTHALMIC

## 2015-06-23 MED ORDER — LIDOCAINE HCL (PF) 4 % IJ SOLN
INTRAOCULAR | Status: DC | PRN
  Administered 2015-06-23: .5 mL via OPHTHALMIC

## 2015-06-23 MED ORDER — BUPIVACAINE HCL (PF) 0.75 % IJ SOLN
INTRAMUSCULAR | Status: AC
  Filled 2015-06-23: qty 10

## 2015-06-23 MED ORDER — LIDOCAINE HCL (PF) 4 % IJ SOLN
INTRAMUSCULAR | Status: AC
  Filled 2015-06-23: qty 5

## 2015-06-23 MED ORDER — NA CHONDROIT SULF-NA HYALURON 40-17 MG/ML IO SOLN
INTRAOCULAR | Status: AC
  Filled 2015-06-23: qty 1

## 2015-06-23 MED ORDER — LABETALOL HCL 5 MG/ML IV SOLN
INTRAVENOUS | Status: DC | PRN
  Administered 2015-06-23: 10 mg via INTRAVENOUS

## 2015-06-23 MED ORDER — ALFENTANIL 500 MCG/ML IJ INJ
INJECTION | INTRAMUSCULAR | Status: DC | PRN
  Administered 2015-06-23: 500 ug via INTRAVENOUS

## 2015-06-23 MED ORDER — CYCLOPENTOLATE HCL 2 % OP SOLN
1.0000 [drp] | OPHTHALMIC | Status: AC | PRN
  Administered 2015-06-23 (×4): 1 [drp] via OPHTHALMIC

## 2015-06-23 MED ORDER — LIDOCAINE HCL (PF) 4 % IJ SOLN
INTRAMUSCULAR | Status: DC | PRN
  Administered 2015-06-23: 4 mL via OPHTHALMIC

## 2015-06-23 MED ORDER — NA CHONDROIT SULF-NA HYALURON 40-17 MG/ML IO SOLN
INTRAOCULAR | Status: DC | PRN
  Administered 2015-06-23: 1 mL via INTRAOCULAR

## 2015-06-23 MED ORDER — ACETYLCHOLINE CHLORIDE 20 MG IO SOLR
INTRAOCULAR | Status: DC | PRN
  Administered 2015-06-23: .5 mg via INTRAOCULAR

## 2015-06-23 MED ORDER — CEFUROXIME OPHTHALMIC INJECTION 1 MG/0.1 ML
INJECTION | OPHTHALMIC | Status: AC
  Filled 2015-06-23: qty 0.1

## 2015-06-23 MED ORDER — EPINEPHRINE HCL 1 MG/ML IJ SOLN
INTRAMUSCULAR | Status: AC
  Filled 2015-06-23: qty 1

## 2015-06-23 MED ORDER — DEXMEDETOMIDINE HCL 200 MCG/2ML IV SOLN
INTRAVENOUS | Status: DC | PRN
  Administered 2015-06-23: 6 ug via INTRAVENOUS

## 2015-06-23 MED ORDER — ACETYLCHOLINE CHLORIDE 20 MG IO SOLR
INTRAOCULAR | Status: AC
  Filled 2015-06-23: qty 1

## 2015-06-23 MED ORDER — TETRACAINE HCL 0.5 % OP SOLN
OPHTHALMIC | Status: AC
  Filled 2015-06-23: qty 2

## 2015-06-23 MED ORDER — HYALURONIDASE HUMAN 150 UNIT/ML IJ SOLN
INTRAMUSCULAR | Status: AC
  Filled 2015-06-23: qty 1

## 2015-06-23 MED ORDER — MIDAZOLAM HCL 2 MG/2ML IJ SOLN
INTRAMUSCULAR | Status: DC | PRN
  Administered 2015-06-23: 1 mg via INTRAVENOUS

## 2015-06-23 MED ORDER — MOXIFLOXACIN HCL 0.5 % OP SOLN
OPHTHALMIC | Status: AC
  Administered 2015-06-23: 1 [drp] via OPHTHALMIC
  Filled 2015-06-23: qty 3

## 2015-06-23 SURGICAL SUPPLY — 30 items
CANNULA ANT/CHMB 27GA (MISCELLANEOUS) ×2 IMPLANT
CORD BIP STRL DISP 12FT (MISCELLANEOUS) ×2 IMPLANT
CUP MEDICINE 2OZ PLAST GRAD ST (MISCELLANEOUS) ×2 IMPLANT
DRAPE XRAY CASSETTE 23X24 (DRAPES) ×2 IMPLANT
ERASER HMR WETFIELD 18G (MISCELLANEOUS) ×2 IMPLANT
GLOVE BIO SURGEON STRL SZ8 (GLOVE) ×2 IMPLANT
GLOVE SURG LX 6.5 MICRO (GLOVE) ×1
GLOVE SURG LX 8.0 MICRO (GLOVE) ×1
GLOVE SURG LX STRL 6.5 MICRO (GLOVE) ×1 IMPLANT
GLOVE SURG LX STRL 8.0 MICRO (GLOVE) ×1 IMPLANT
GOWN STRL REUS W/ TWL LRG LVL3 (GOWN DISPOSABLE) ×1 IMPLANT
GOWN STRL REUS W/ TWL XL LVL3 (GOWN DISPOSABLE) ×1 IMPLANT
GOWN STRL REUS W/TWL LRG LVL3 (GOWN DISPOSABLE) ×1
GOWN STRL REUS W/TWL XL LVL3 (GOWN DISPOSABLE) ×1
LENS IOL ACRSF IQ ULTRA 19.0 (Intraocular Lens) ×1 IMPLANT
LENS IOL ACRYSOF IQ 19.0 (Intraocular Lens) ×2 IMPLANT
PACK CATARACT (MISCELLANEOUS) ×2 IMPLANT
PACK CATARACT DINGLEDEIN LX (MISCELLANEOUS) ×2 IMPLANT
PACK EYE AFTER SURG (MISCELLANEOUS) ×2 IMPLANT
SHLD EYE VISITEC  UNIV (MISCELLANEOUS) ×2 IMPLANT
SOL BSS BAG (MISCELLANEOUS) ×2
SOL PREP PVP 2OZ (MISCELLANEOUS) ×2
SOLUTION BSS BAG (MISCELLANEOUS) ×1 IMPLANT
SOLUTION PREP PVP 2OZ (MISCELLANEOUS) ×1 IMPLANT
SUT SILK 5-0 (SUTURE) ×2 IMPLANT
SYR 3ML LL SCALE MARK (SYRINGE) ×2 IMPLANT
SYR 5ML LL (SYRINGE) ×2 IMPLANT
SYR TB 1ML 27GX1/2 LL (SYRINGE) ×2 IMPLANT
WATER STERILE IRR 1000ML POUR (IV SOLUTION) ×2 IMPLANT
WIPE NON LINTING 3.25X3.25 (MISCELLANEOUS) ×2 IMPLANT

## 2015-06-23 NOTE — Anesthesia Preprocedure Evaluation (Signed)
Anesthesia Evaluation  Patient identified by MRN, date of birth, ID band Patient awake    Reviewed: Allergy & Precautions, H&P , NPO status , Patient's Chart, lab work & pertinent test results, reviewed documented beta blocker date and time   History of Anesthesia Complications Negative for: history of anesthetic complications  Airway Mallampati: III  TM Distance: >3 FB Neck ROM: full    Dental no notable dental hx. (+) Caps, Teeth Intact Multiple bridges:   Pulmonary neg shortness of breath, neg sleep apnea, neg COPD, Recent URI , Residual Cough, former smoker,    Pulmonary exam normal breath sounds clear to auscultation       Cardiovascular Exercise Tolerance: Good hypertension, On Medications (-) angina(-) CAD, (-) Past MI, (-) Cardiac Stents and (-) CABG Normal cardiovascular exam(-) dysrhythmias (-) Valvular Problems/Murmurs Rhythm:regular Rate:Normal     Neuro/Psych negative neurological ROS  negative psych ROS   GI/Hepatic negative GI ROS, Neg liver ROS,   Endo/Other  diabetes, Well ControlledHypothyroidism   Renal/GU negative Renal ROS  negative genitourinary   Musculoskeletal   Abdominal   Peds  Hematology negative hematology ROS (+)   Anesthesia Other Findings Past Medical History:   Hypertension                                                 Anemia                                                       Diabetes mellitus without complication (HCC)                 Gout                                                         Hypothyroidism                                               Cough                                                          Comment:RESOLVING, NO FEVER   Reproductive/Obstetrics negative OB ROS                             Anesthesia Physical Anesthesia Plan  ASA: III  Anesthesia Plan: MAC   Post-op Pain Management:    Induction:   Airway Management  Planned:   Additional Equipment:   Intra-op Plan:   Post-operative Plan:   Informed Consent: I have reviewed the patients History and Physical, chart, labs and discussed the procedure including the risks, benefits and alternatives for the proposed anesthesia with the patient or authorized  representative who has indicated his/her understanding and acceptance.   Dental Advisory Given  Plan Discussed with: Anesthesiologist, CRNA and Surgeon  Anesthesia Plan Comments:         Anesthesia Quick Evaluation

## 2015-06-23 NOTE — Interval H&P Note (Signed)
History and Physical Interval Note:  06/23/2015 7:27 AM  Cassandra Schaefer  has presented today for surgery, with the diagnosis of cataract  The various methods of treatment have been discussed with the patient and family. After consideration of risks, benefits and other options for treatment, the patient has consented to  Procedure(s): CATARACT EXTRACTION PHACO AND INTRAOCULAR LENS PLACEMENT (IOC) (Right) as a surgical intervention .  The patient's history has been reviewed, patient examined, no change in status, stable for surgery.  I have reviewed the patient's chart and labs.  Questions were answered to the patient's satisfaction.     [name]

## 2015-06-23 NOTE — Transfer of Care (Signed)
Immediate Anesthesia Transfer of Care Note  Patient: Natajah Derderian Watchman  Procedure(s) Performed: Procedure(s) with comments: CATARACT EXTRACTION PHACO AND INTRAOCULAR LENS PLACEMENT (IOC) (Right) - [place] 01:28 AP% 23.4 CDE 36.14 fluid pack lot # [id number]  Patient Location: PACU  Anesthesia Type:MAC  Level of Consciousness: awake, alert , oriented and patient cooperative  Airway & Oxygen Therapy: Patient Spontanous Breathing  Post-op Assessment: Report given to RN and Post -op Vital signs reviewed and stable  Post vital signs: Reviewed and stable  Last Vitals:  Filed Vitals:   06/23/15 0817 06/23/15 0820  BP: 134/99 134/99  Pulse: 65 77  Temp: 36.6 C 36.6 C  Resp: 16 16    Complications: No apparent anesthesia complications

## 2015-06-23 NOTE — Discharge Instructions (Signed)
AMBULATORY SURGERY  DISCHARGE INSTRUCTIONS   1) The drugs that you were given will stay in your system until tomorrow so for the next 24 hours you should not:  A) Drive an automobile B) Make any legal decisions C) Drink any alcoholic beverage   2) You may resume regular meals tomorrow.  Today it is better to start with liquids and gradually work up to solid foods.  You may eat anything you prefer, but it is better to start with liquids, then soup and crackers, and gradually work up to solid foods.   3) Please notify your doctor immediately if you have any unusual bleeding, trouble breathing, redness and pain at the surgery site, drainage, fever, or pain not relieved by medication.    4) Additional Instructions:    Eye Surgery Discharge Instructions  Expect mild scratchy sensation or mild soreness. DO NOT RUB YOUR EYE!  The day of surgery:  Minimal physical activity, but bed rest is not required  No reading, computer work, or close hand work  No bending, lifting, or straining.  May watch TV  For 24 hours:  No driving, legal decisions, or alcoholic beverages  Safety precautions  Eat anything you prefer: It is better to start with liquids, then soup then solid foods.  _____ Eye patch should be worn until postoperative exam tomorrow.  ____ Solar shield eyeglasses should be worn for comfort in the sunlight/patch while sleeping  Resume all regular medications including aspirin or Coumadin if these were discontinued prior to surgery. You may shower, bathe, shave, or wash your hair. Tylenol may be taken for mild discomfort.  Call your doctor if you experience significant pain, nausea, or vomiting, fever > 101 or other signs of infection. [phone] or [phone] Specific instructions:  Follow-up Information    Follow up with Naveyah Iacovelli, MD In 1 day.   Specialty:  Ophthalmology   Why:  February 28 at 10:40am   Contact information:   [address]   [place] [address] [phone]         Please contact your physician with any problems or Same Day Surgery at [phone], Monday through Friday 6 am to 4 pm, or Dante at [facility] number at [phone].

## 2015-06-23 NOTE — Op Note (Signed)
Date of Surgery: 06/23/2015 Date of Dictation: 06/23/2015 8:14 AM Pre-operative Diagnosis:  Nuclear Sclerotic Cataract right Eye Post-operative Diagnosis: same Procedure performed: Extra-capsular Cataract Extraction (ECCE) with placement of a posterior chamber intraocular lens (IOL) right Eye IOL:  Implant Name Type Inv. Item Serial No. Manufacturer Lot No. LRB No. Used  LENS IOL ACRYSOF IQ 19.0 - [id number] Intraocular Lens LENS IOL ACRYSOF IQ 19.0 [id number] ALCON   Right 1   Anesthesia: 2% Lidocaine and 4% Marcaine in a 50/50 mixture with 10 unites/ml of Hylenex given as a peribulbar Anesthesiologist: Anesthesiologist: Lenard Simmer, MD CRNA: Darrol Jump, CRNA Complications: none Estimated Blood Loss: less than 1 ml  Description of procedure:  The patient was given anesthesia and sedation via intravenous access. The patient was then prepped and draped in the usual fashion. A 25-gauge needle was bent for initiating the capsulorhexis. A 5-0 silk suture was placed through the conjunctiva superior and inferiorly to serve as bridle sutures. Hemostasis was obtained at the superior limbus using an eraser cautery. A partial thickness groove was made at the anterior surgical limbus with a 64 Beaver blade and this was dissected anteriorly with an SYSCO. The anterior chamber was entered at 10 o'clock with a 1.0 mm paracentesis knife and through the lamellar dissection with a 2.6 mm Alcon keratome. Epi-Shugarcaine 0.5 CC [9 cc BSS Plus (Alcon), 3 cc 4% preservative-free lidocaine (Hospira) and 4 cc 1:1000 preservative-free, bisulfite-free epinephrine] was injected into the anterior chamber via the paracentesis tract. Epi-Shugarcaine 0.5 CC [9 cc BSS Plus (Alcon), 3 cc 4% preservative-free lidocaine (Hospira) and 4 cc 1:1000 preservative-free, bisulfite-free epinephrine] was injected into the anterior chamber via the paracentesis tract. DiscoVisc was injected to replace the aqueous and a  continuous tear curvilinear capsulorhexis was performed using a bent 25-gauge needle.  Balance salt on a syringe was used to perform hydro-dissection and phacoemulsification was carried out using a divide and conquer technique. Procedure(s) with comments: CATARACT EXTRACTION PHACO AND INTRAOCULAR LENS PLACEMENT (IOC) (Right) - [place] 01:28 AP% 23.4 CDE 36.14 fluid pack lot # [id number]. Irrigation/aspiration was used to remove the residual cortex and the capsular bag was inflated with DiscoVisc. The intraocular lens was inserted into the capsular bag using a pre-loaded UltraSert Delivery System. Irrigation/aspiration was used to remove the residual DiscoVisc. The wound was inflated with balanced salt and checked for leaks. None were found. Miostat was injected via the paracentesis track and 0.1 ml of Vigamox containing 1 mg of drug  was injected via the paracentesis track. The wound was checked for leaks again and none were found.   The bridal sutures were removed and two drops of Vigamox were placed on the eye. An eye shield was placed to protect the eye and the patient was discharged to the recovery area in good condition.   Glennie Rodda MD

## 2015-06-24 NOTE — Anesthesia Postprocedure Evaluation (Signed)
Anesthesia Post Note  Patient: Cassandra Schaefer  Procedure(s) Performed: Procedure(s) (LRB): CATARACT EXTRACTION PHACO AND INTRAOCULAR LENS PLACEMENT (IOC) (Right)  Patient location during evaluation: PACU Anesthesia Type: General Level of consciousness: awake and alert Pain management: pain level controlled Vital Signs Assessment: post-procedure vital signs reviewed and stable Respiratory status: spontaneous breathing, nonlabored ventilation, respiratory function stable and patient connected to nasal cannula oxygen Cardiovascular status: blood pressure returned to baseline and stable Postop Assessment: no signs of nausea or vomiting Anesthetic complications: no    Last Vitals:  Filed Vitals:   06/23/15 0817 06/23/15 0820  BP: 134/99 147/58  Pulse: 65 77  Temp: 36.6 C 36.6 C  Resp: 16 16    Last Pain:  Filed Vitals:   06/24/15 0805  PainSc: 0-No pain                 Lenard Simmer

## 2015-07-22 ENCOUNTER — Encounter: Payer: Self-pay | Admitting: *Deleted

## 2015-07-27 NOTE — H&P (Signed)
See scanned note.

## 2015-07-28 ENCOUNTER — Encounter: Payer: Self-pay | Admitting: *Deleted

## 2015-07-28 ENCOUNTER — Encounter
Admission: RE | Disposition: A | Discharge status: Home / Self Care | Payer: Self-pay | Source: Ambulatory Visit | Attending: Ophthalmology

## 2015-07-28 ENCOUNTER — Ambulatory Visit
Admission: RE | Admit: 2015-07-28 | Discharge: 2015-07-28 | Disposition: A | Discharge status: Home / Self Care | Payer: Medicare HMO | Source: Ambulatory Visit | Attending: Ophthalmology | Admitting: Ophthalmology

## 2015-07-28 ENCOUNTER — Ambulatory Visit: Payer: Medicare HMO | Admitting: Anesthesiology

## 2015-07-28 DIAGNOSIS — Z79899 Other long term (current) drug therapy: Secondary | ICD-10-CM | POA: Diagnosis not present

## 2015-07-28 DIAGNOSIS — H2512 Age-related nuclear cataract, left eye: Secondary | ICD-10-CM | POA: Insufficient documentation

## 2015-07-28 DIAGNOSIS — Z7984 Long term (current) use of oral hypoglycemic drugs: Secondary | ICD-10-CM | POA: Insufficient documentation

## 2015-07-28 DIAGNOSIS — E7800 Pure hypercholesterolemia, unspecified: Secondary | ICD-10-CM | POA: Diagnosis not present

## 2015-07-28 DIAGNOSIS — E079 Disorder of thyroid, unspecified: Secondary | ICD-10-CM | POA: Diagnosis not present

## 2015-07-28 DIAGNOSIS — Z887 Allergy status to serum and vaccine status: Secondary | ICD-10-CM | POA: Insufficient documentation

## 2015-07-28 DIAGNOSIS — Z91040 Latex allergy status: Secondary | ICD-10-CM | POA: Insufficient documentation

## 2015-07-28 DIAGNOSIS — M109 Gout, unspecified: Secondary | ICD-10-CM | POA: Insufficient documentation

## 2015-07-28 DIAGNOSIS — I10 Essential (primary) hypertension: Secondary | ICD-10-CM | POA: Insufficient documentation

## 2015-07-28 DIAGNOSIS — Z8711 Personal history of peptic ulcer disease: Secondary | ICD-10-CM | POA: Diagnosis not present

## 2015-07-28 DIAGNOSIS — Z9889 Other specified postprocedural states: Secondary | ICD-10-CM | POA: Insufficient documentation

## 2015-07-28 DIAGNOSIS — Z9049 Acquired absence of other specified parts of digestive tract: Secondary | ICD-10-CM | POA: Insufficient documentation

## 2015-07-28 DIAGNOSIS — E119 Type 2 diabetes mellitus without complications: Secondary | ICD-10-CM | POA: Diagnosis not present

## 2015-07-28 DIAGNOSIS — Z880 Allergy status to penicillin: Secondary | ICD-10-CM | POA: Insufficient documentation

## 2015-07-28 DIAGNOSIS — Z87891 Personal history of nicotine dependence: Secondary | ICD-10-CM | POA: Diagnosis not present

## 2015-07-28 DIAGNOSIS — H269 Unspecified cataract: Secondary | ICD-10-CM | POA: Diagnosis present

## 2015-07-28 HISTORY — PX: CATARACT EXTRACTION W/PHACO: SHX586

## 2015-07-28 HISTORY — DX: Peptic ulcer, site unspecified, unspecified as acute or chronic, without hemorrhage or perforation: K27.9

## 2015-07-28 LAB — GLUCOSE, CAPILLARY: Glucose-Capillary: 153 mg/dL — ABNORMAL HIGH (ref 65–99)

## 2015-07-28 SURGERY — PHACOEMULSIFICATION, CATARACT, WITH IOL INSERTION
Anesthesia: Monitor Anesthesia Care | Laterality: Left | Wound class: Clean

## 2015-07-28 MED ORDER — POVIDONE-IODINE 5 % OP SOLN
OPHTHALMIC | Status: DC | PRN
  Administered 2015-07-28: 1 via OPHTHALMIC

## 2015-07-28 MED ORDER — PHENYLEPHRINE HCL 10 % OP SOLN
OPHTHALMIC | Status: AC
  Administered 2015-07-28: 1 [drp] via OPHTHALMIC
  Filled 2015-07-28: qty 5

## 2015-07-28 MED ORDER — LIDOCAINE HCL (PF) 4 % IJ SOLN
INTRAMUSCULAR | Status: AC
  Filled 2015-07-28: qty 5

## 2015-07-28 MED ORDER — CARBACHOL 0.01 % IO SOLN
INTRAOCULAR | Status: DC | PRN
  Administered 2015-07-28: .5 mL via INTRAOCULAR

## 2015-07-28 MED ORDER — NA CHONDROIT SULF-NA HYALURON 40-17 MG/ML IO SOLN
INTRAOCULAR | Status: AC
  Filled 2015-07-28: qty 1

## 2015-07-28 MED ORDER — MOXIFLOXACIN HCL 0.5 % OP SOLN
OPHTHALMIC | Status: AC
  Administered 2015-07-28: 1 [drp] via OPHTHALMIC
  Filled 2015-07-28: qty 3

## 2015-07-28 MED ORDER — TETRACAINE HCL 0.5 % OP SOLN
OPHTHALMIC | Status: AC
  Filled 2015-07-28: qty 2

## 2015-07-28 MED ORDER — CYCLOPENTOLATE HCL 2 % OP SOLN
1.0000 [drp] | OPHTHALMIC | Status: DC | PRN
  Administered 2015-07-28 (×3): 1 [drp] via OPHTHALMIC

## 2015-07-28 MED ORDER — PHENYLEPHRINE HCL 10 % OP SOLN
1.0000 [drp] | OPHTHALMIC | Status: AC | PRN
  Administered 2015-07-28 (×4): 1 [drp] via OPHTHALMIC

## 2015-07-28 MED ORDER — METOPROLOL TARTRATE 1 MG/ML IV SOLN
INTRAVENOUS | Status: DC | PRN
  Administered 2015-07-28: 2.5 mg via INTRAVENOUS

## 2015-07-28 MED ORDER — EPINEPHRINE HCL 1 MG/ML IJ SOLN
INTRAMUSCULAR | Status: AC
  Filled 2015-07-28: qty 2

## 2015-07-28 MED ORDER — HYALURONIDASE HUMAN 150 UNIT/ML IJ SOLN
INTRAMUSCULAR | Status: AC
  Filled 2015-07-28: qty 1

## 2015-07-28 MED ORDER — POVIDONE-IODINE 5 % OP SOLN
OPHTHALMIC | Status: AC
  Filled 2015-07-28: qty 30

## 2015-07-28 MED ORDER — LIDOCAINE HCL (PF) 4 % IJ SOLN
INTRAMUSCULAR | Status: DC | PRN
  Administered 2015-07-28: 5 mL via OPHTHALMIC

## 2015-07-28 MED ORDER — TETRACAINE HCL 0.5 % OP SOLN
OPHTHALMIC | Status: DC | PRN
  Administered 2015-07-28: 2 [drp] via OPHTHALMIC

## 2015-07-28 MED ORDER — BUPIVACAINE HCL (PF) 0.75 % IJ SOLN
INTRAMUSCULAR | Status: AC
  Filled 2015-07-28: qty 10

## 2015-07-28 MED ORDER — ALFENTANIL 500 MCG/ML IJ INJ
INJECTION | INTRAMUSCULAR | Status: DC | PRN
  Administered 2015-07-28: 500 ug via INTRAVENOUS

## 2015-07-28 MED ORDER — BSS IO SOLN
INTRAOCULAR | Status: DC | PRN
  Administered 2015-07-28: .5 mL via OPHTHALMIC

## 2015-07-28 MED ORDER — CYCLOPENTOLATE HCL 2 % OP SOLN
OPHTHALMIC | Status: AC
  Administered 2015-07-28: 1 [drp] via OPHTHALMIC
  Filled 2015-07-28: qty 2

## 2015-07-28 MED ORDER — MOXIFLOXACIN HCL 0.5 % OP SOLN
1.0000 [drp] | OPHTHALMIC | Status: AC | PRN
  Administered 2015-07-28 (×3): 1 [drp] via OPHTHALMIC

## 2015-07-28 MED ORDER — NA CHONDROIT SULF-NA HYALURON 40-17 MG/ML IO SOLN
INTRAOCULAR | Status: DC | PRN
  Administered 2015-07-28: 1 mL via INTRAOCULAR

## 2015-07-28 MED ORDER — EPINEPHRINE HCL 1 MG/ML IJ SOLN
INTRAOCULAR | Status: DC | PRN
  Administered 2015-07-28: 250 mL via OPHTHALMIC

## 2015-07-28 MED ORDER — SODIUM CHLORIDE 0.9 % IV SOLN
INTRAVENOUS | Status: DC
  Administered 2015-07-28: 08:00:00 via INTRAVENOUS

## 2015-07-28 MED ORDER — MOXIFLOXACIN HCL 0.5 % OP SOLN
OPHTHALMIC | Status: DC | PRN
  Administered 2015-07-28: 2 [drp] via OPHTHALMIC

## 2015-07-28 SURGICAL SUPPLY — 29 items
CANNULA ANT/CHMB 27GA (MISCELLANEOUS) ×3 IMPLANT
CORD BIP STRL DISP 12FT (MISCELLANEOUS) ×3 IMPLANT
CUP MEDICINE 2OZ PLAST GRAD ST (MISCELLANEOUS) ×3 IMPLANT
DRAPE XRAY CASSETTE 23X24 (DRAPES) ×3 IMPLANT
ERASER HMR WETFIELD 18G (MISCELLANEOUS) ×3 IMPLANT
GLOVE BIO SURGEON STRL SZ8 (GLOVE) ×3 IMPLANT
GLOVE SURG LX 6.5 MICRO (GLOVE) ×2
GLOVE SURG LX 8.0 MICRO (GLOVE) ×2
GLOVE SURG LX STRL 6.5 MICRO (GLOVE) ×1 IMPLANT
GLOVE SURG LX STRL 8.0 MICRO (GLOVE) ×1 IMPLANT
GOWN STRL REUS W/ TWL LRG LVL3 (GOWN DISPOSABLE) ×1 IMPLANT
GOWN STRL REUS W/ TWL XL LVL3 (GOWN DISPOSABLE) ×1 IMPLANT
GOWN STRL REUS W/TWL LRG LVL3 (GOWN DISPOSABLE) ×2
GOWN STRL REUS W/TWL XL LVL3 (GOWN DISPOSABLE) ×2
LENS IOL ACRYSOF IQ 19.5 (Intraocular Lens) ×3 IMPLANT
PACK CATARACT (MISCELLANEOUS) ×3 IMPLANT
PACK CATARACT DINGLEDEIN LX (MISCELLANEOUS) ×3 IMPLANT
PACK EYE AFTER SURG (MISCELLANEOUS) ×3 IMPLANT
SHLD EYE VISITEC  UNIV (MISCELLANEOUS) ×3 IMPLANT
SOL BSS BAG (MISCELLANEOUS) ×3
SOL PREP PVP 2OZ (MISCELLANEOUS)
SOLUTION BSS BAG (MISCELLANEOUS) ×1 IMPLANT
SOLUTION PREP PVP 2OZ (MISCELLANEOUS) IMPLANT
SUT SILK 5-0 (SUTURE) ×3 IMPLANT
SYR 3ML LL SCALE MARK (SYRINGE) ×3 IMPLANT
SYR 5ML LL (SYRINGE) ×3 IMPLANT
SYR TB 1ML 27GX1/2 LL (SYRINGE) ×3 IMPLANT
WATER STERILE IRR 1000ML POUR (IV SOLUTION) ×3 IMPLANT
WIPE NON LINTING 3.25X3.25 (MISCELLANEOUS) ×3 IMPLANT

## 2015-07-28 NOTE — Op Note (Signed)
Date of Surgery: 07/28/2015 Date of Dictation: 07/28/2015 9:44 AM Pre-operative Diagnosis:  Nuclear Sclerotic Cataract left Eye Post-operative Diagnosis: same Procedure performed: Extra-capsular Cataract Extraction (ECCE) with placement of a posterior chamber intraocular lens (IOL) left Eye IOL:  Implant Name Type Inv. Item Serial No. Manufacturer Lot No. LRB No. Used  LENS IOL ACRYSOF IQ 19.5 - [id number] Intraocular Lens LENS IOL ACRYSOF IQ 19.5 [id number] ALCON   Left 1   Anesthesia: 2% Lidocaine and 4% Marcaine in a 50/50 mixture with 10 unites/ml of Hylenex given as a peribulbar Anesthesiologist: Anesthesiologist: Berdine AddisonMathai Thomas, MD CRNA: Michaele OfferKasey Savage, CRNA Complications: none Estimated Blood Loss: less than 1 ml  Description of procedure:  The patient was given anesthesia and sedation via intravenous access. The patient was then prepped and draped in the usual fashion. A 25-gauge needle was bent for initiating the capsulorhexis. A 5-0 silk suture was placed through the conjunctiva superior and inferiorly to serve as bridle sutures. Hemostasis was obtained at the superior limbus using an eraser cautery. A partial thickness groove was made at the anterior surgical limbus with a 64 Beaver blade and this was dissected anteriorly with an SYSCOlcon Crescent knife. The anterior chamber was entered at 10 o'clock with a 1.0 mm paracentesis knife and through the lamellar dissection with a 2.6 mm Alcon keratome. Epi-Shugarcaine 0.5 CC [9 cc BSS Plus (Alcon), 3 cc 4% preservative-free lidocaine (Hospira) and 4 cc 1:1000 preservative-free, bisulfite-free epinephrine] was injected into the anterior chamber via the paracentesis tract. Epi-Shugarcaine 0.5 CC [9 cc BSS Plus (Alcon), 3 cc 4% preservative-free lidocaine (Hospira) and 4 cc 1:1000 preservative-free, bisulfite-free epinephrine] was injected into the anterior chamber via the paracentesis tract. DiscoVisc was injected to replace the aqueous and a  continuous tear curvilinear capsulorhexis was performed using a bent 25-gauge needle.  Balance salt on a syringe was used to perform hydro-dissection and phacoemulsification was carried out using a divide and conquer technique. Procedure(s) with comments: CATARACT EXTRACTION PHACO AND INTRAOCULAR LENS PLACEMENT (IOC) (Left) - US    1:29.7 AP%  24.9 CDE   41.32 fluid casette lot #[id number] H exp 09/23/2016. Irrigation/aspiration was used to remove the residual cortex and the capsular bag was inflated with DiscoVisc. The intraocular lens was inserted into the capsular bag using a pre-loaded UltraSert Delivery System. Irrigation/aspiration was used to remove the residual DiscoVisc. The wound was inflated with balanced salt and checked for leaks. None were found. Miostat was injected via the paracentesis track and 0.1 ml of cefuroxime containing 1 mg of drug  was injected via the paracentesis track. The wound was checked for leaks again and none were found.   The bridal sutures were removed and two drops of Vigamox were placed on the eye. An eye shield was placed to protect the eye and the patient was discharged to the recovery area in good condition.   Arantza Darrington MD

## 2015-07-28 NOTE — Interval H&P Note (Signed)
History and Physical Interval Note:  07/28/2015 9:00 AM  Cassandra Schaefer  has presented today for surgery, with the diagnosis of CATARACT  The various methods of treatment have been discussed with the patient and family. After consideration of risks, benefits and other options for treatment, the patient has consented to  Procedure(s): CATARACT EXTRACTION PHACO AND INTRAOCULAR LENS PLACEMENT (IOC) (Left) as a surgical intervention .  The patient's history has been reviewed, patient examined, no change in status, stable for surgery.  I have reviewed the patient's chart and labs.  Questions were answered to the patient's satisfaction.     Kamill Fulbright

## 2015-07-28 NOTE — Anesthesia Postprocedure Evaluation (Signed)
Anesthesia Post Note  Patient: Cassandra Schaefer  Procedure(s) Performed: Procedure(s) (LRB): CATARACT EXTRACTION PHACO AND INTRAOCULAR LENS PLACEMENT (IOC) (Left)  Patient location during evaluation: PACU Anesthesia Type: MAC Level of consciousness: awake and alert Pain management: satisfactory to patient Vital Signs Assessment: post-procedure vital signs reviewed and stable Respiratory status: respiratory function stable and spontaneous breathing Cardiovascular status: stable Anesthetic complications: no    Last Vitals:  Filed Vitals:   07/28/15 0756  BP: 166/55  Pulse: 80  Temp: 36.6 C  Resp: 18    Last Pain: There were no vitals filed for this visit.               Michaele OfferSavage,  Stephanine Reas A

## 2015-07-28 NOTE — Transfer of Care (Signed)
Immediate Anesthesia Transfer of Care Note  Patient: Cassandra BorsJanet Ann Schaefer  Procedure(s) Performed: Procedure(s) with comments: CATARACT EXTRACTION PHACO AND INTRAOCULAR LENS PLACEMENT (IOC) (Left) - US    1:29.7 AP%  24.9 CDE   41.32 fluid casette lot #[id number] H exp 09/23/2016  Patient Location: PACU  Anesthesia Type:MAC  Level of Consciousness: awake, alert , oriented and patient cooperative  Airway & Oxygen Therapy: Patient Spontanous Breathing  Post-op Assessment: Report given to RN, Post -op Vital signs reviewed and stable and Patient moving all extremities X 4  Post vital signs: Reviewed and stable  Last Vitals:  Filed Vitals:   07/28/15 0756  BP: 166/55  Pulse: 80  Temp: 36.6 C  Resp: 18    Complications: No apparent anesthesia complications

## 2015-07-28 NOTE — Anesthesia Preprocedure Evaluation (Addendum)
Anesthesia Evaluation  Patient identified by MRN, date of birth, ID band Patient awake    Reviewed: Allergy & Precautions, NPO status , Patient's Chart, lab work & pertinent test results, reviewed documented beta blocker date and time   Airway Mallampati: II  TM Distance: >3 FB     Dental  (+) Chipped, Partial Lower, Partial Upper   Pulmonary former smoker,           Cardiovascular hypertension, Pt. on medications      Neuro/Psych    GI/Hepatic PUD,   Endo/Other  diabetesHypothyroidism   Renal/GU      Musculoskeletal   Abdominal   Peds  Hematology  (+) anemia ,   Anesthesia Other Findings   Reproductive/Obstetrics                            Anesthesia Physical Anesthesia Plan  ASA: III  Anesthesia Plan: MAC   Post-op Pain Management:    Induction:   Airway Management Planned:   Additional Equipment:   Intra-op Plan:   Post-operative Plan:   Informed Consent: I have reviewed the patients History and Physical, chart, labs and discussed the procedure including the risks, benefits and alternatives for the proposed anesthesia with the patient or authorized representative who has indicated his/her understanding and acceptance.     Plan Discussed with: CRNA  Anesthesia Plan Comments:         Anesthesia Quick Evaluation

## 2015-07-28 NOTE — Discharge Instructions (Addendum)
See scanned note. General Anesthesia, Adult, Care After Refer to this sheet in the next few weeks. These instructions provide you with information on caring for yourself after your procedure. Your health care provider may also give you more specific instructions. Your treatment has been planned according to current medical practices, but problems sometimes occur. Call your health care provider if you have any problems or questions after your procedure. WHAT TO EXPECT AFTER THE PROCEDURE After the procedure, it is typical to experience:  Sleepiness.  Nausea and vomiting. HOME CARE INSTRUCTIONS  For the first 24 hours after general anesthesia:  Have a responsible person with you.  Do not drive a car. If you are alone, do not take public transportation.  Do not drink alcohol.  Do not take medicine that has not been prescribed by your health care provider.  Do not sign important papers or make important decisions.  You may resume a normal diet and activities as directed by your health care provider.  Change bandages (dressings) as directed.  If you have questions or problems that seem related to general anesthesia, call the hospital and ask for the anesthetist or anesthesiologist on call. SEEK MEDICAL CARE IF:  You have nausea and vomiting that continue the day after anesthesia.  You develop a rash. SEEK IMMEDIATE MEDICAL CARE IF:   You have difficulty breathing.  You have chest pain.  You have any allergic problems.   This information is not intended to replace advice given to you by your health care provider. Make sure you discuss any questions you have with your health care provider.   Document Released: 07/19/2000 Document Revised: 05/03/2014 Document Reviewed: 08/11/2011 Elsevier Interactive Patient Education [employer]2016 Elsevier Inc.

## 2015-12-10 ENCOUNTER — Other Ambulatory Visit: Payer: Self-pay | Admitting: Internal Medicine

## 2015-12-10 DIAGNOSIS — E039 Hypothyroidism, unspecified: Secondary | ICD-10-CM | POA: Insufficient documentation

## 2015-12-10 DIAGNOSIS — Z1231 Encounter for screening mammogram for malignant neoplasm of breast: Secondary | ICD-10-CM

## 2015-12-18 ENCOUNTER — Other Ambulatory Visit: Payer: Self-pay | Admitting: Internal Medicine

## 2015-12-18 ENCOUNTER — Ambulatory Visit
Admission: RE | Admit: 2015-12-18 | Discharge: 2015-12-18 | Disposition: A | Discharge status: Home / Self Care | Payer: Medicare HMO | Source: Ambulatory Visit | Attending: Internal Medicine | Admitting: Internal Medicine

## 2015-12-18 DIAGNOSIS — Z1231 Encounter for screening mammogram for malignant neoplasm of breast: Secondary | ICD-10-CM | POA: Insufficient documentation

## 2015-12-18 IMAGING — MG 2D DIGITAL SCREENING BILATERAL MAMMOGRAM WITH CAD AND ADJUNCT TO
8 of 13 series · 8 of 29 positions shown · non-contrast
Comparison: Previous exam(s).

ACR Breast Density Category a: The breast tissue is almost entirely
fatty.

CLINICAL DATA: Screening.

EXAM:
2D DIGITAL SCREENING BILATERAL MAMMOGRAM WITH CAD AND ADJUNCT TOMO

[L CC (1 of 2)]
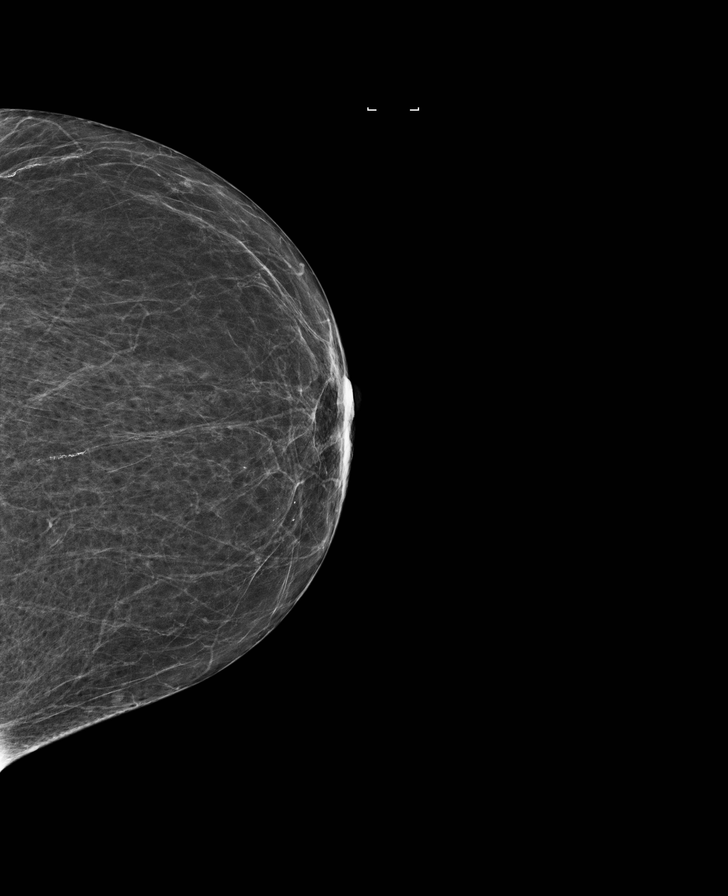

[R MLO synth-2D]
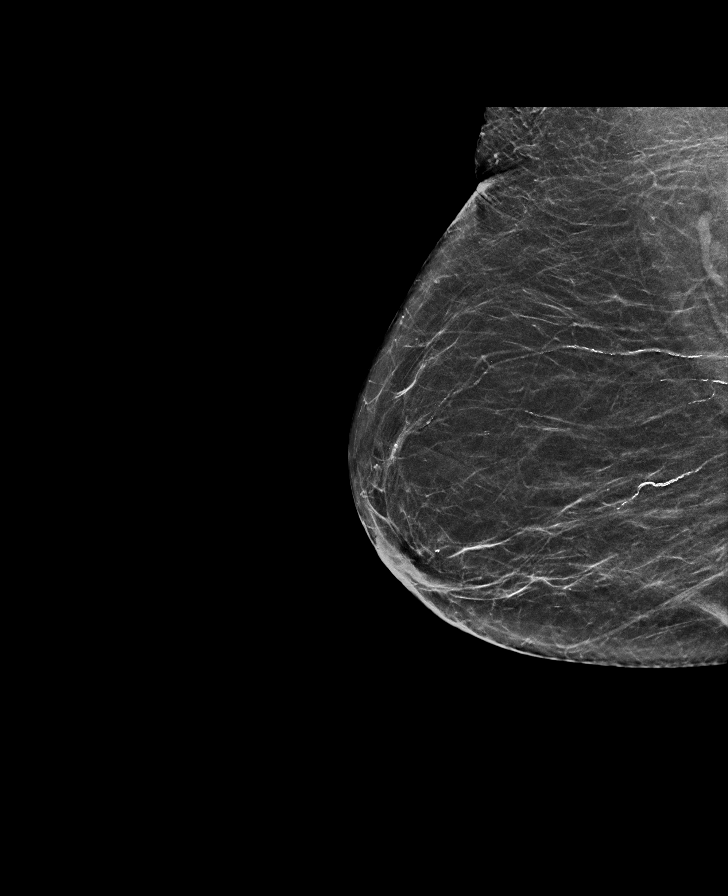

[R MLO]
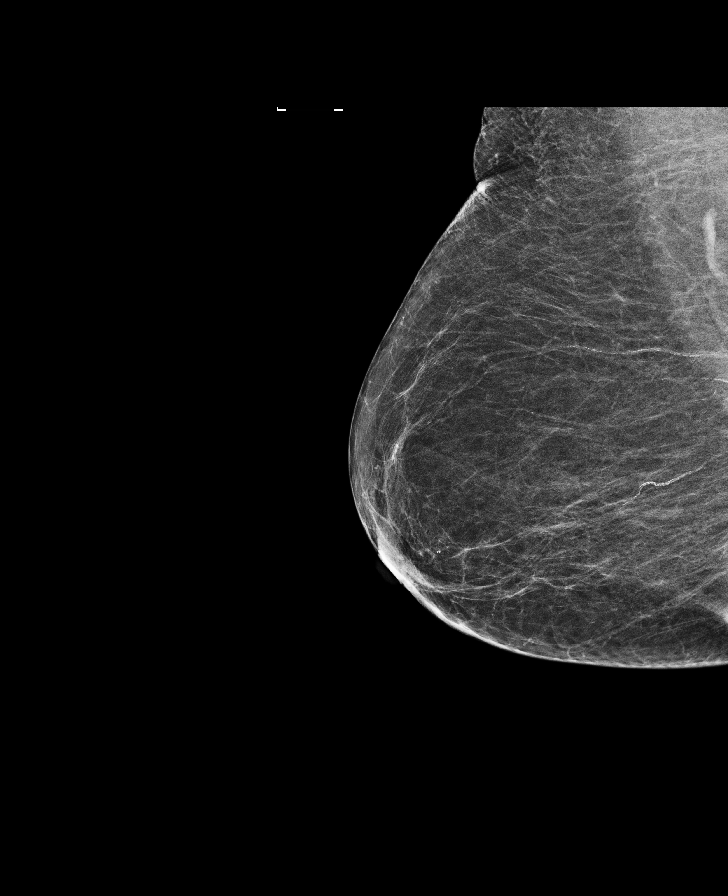

[R CC synth-2D]
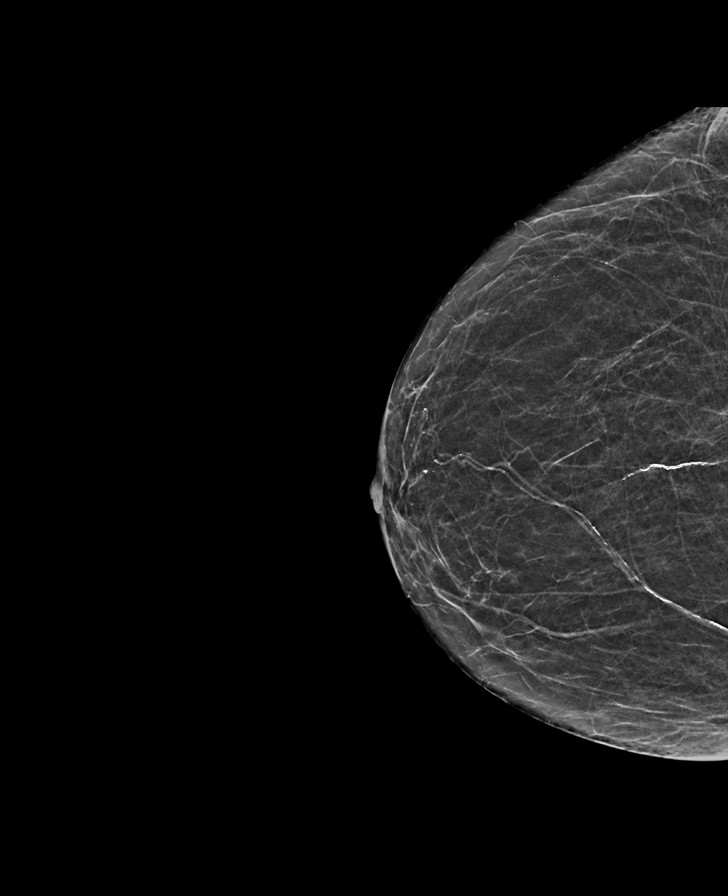

[L MLO synth-2D]
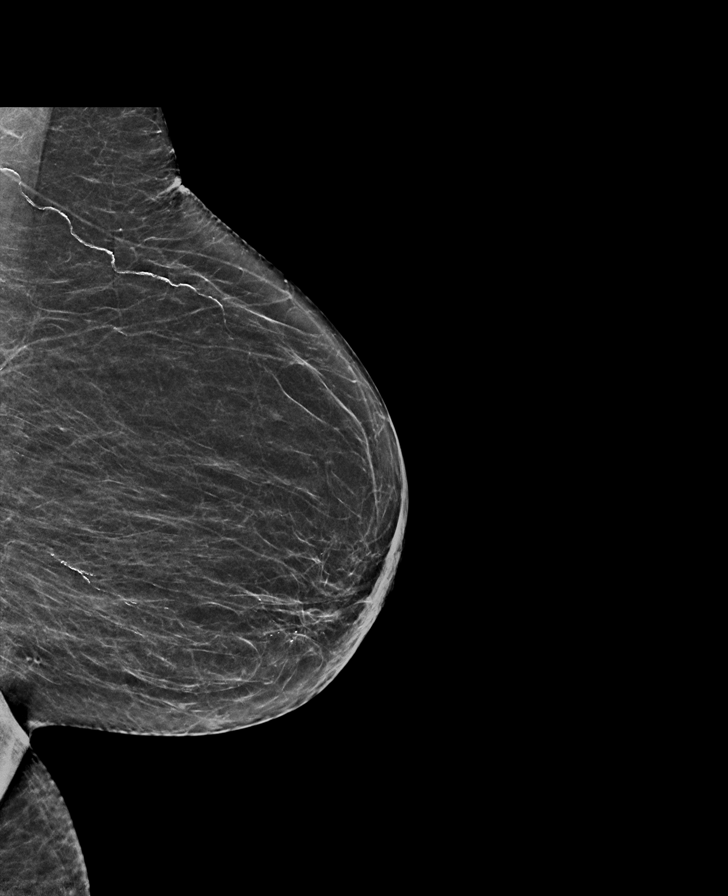

[L CC (2 of 2)]
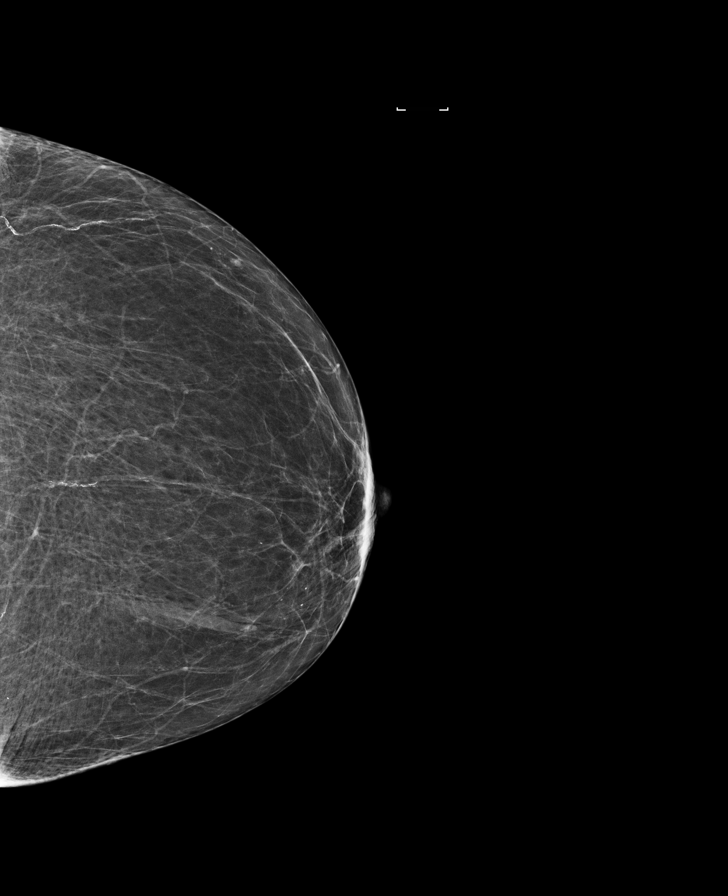

[L MLO]
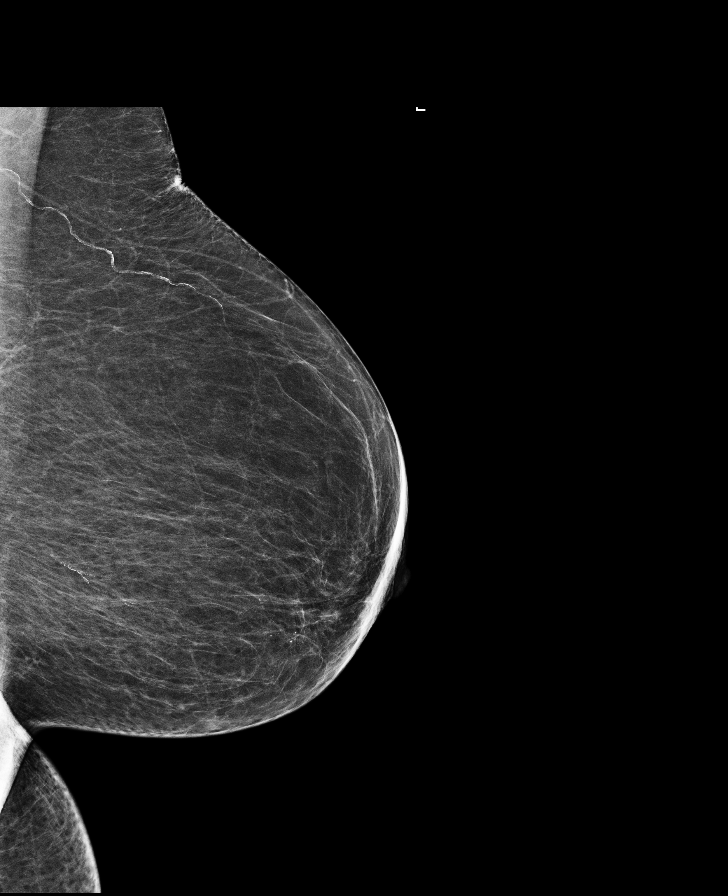

[L CC synth-2D]
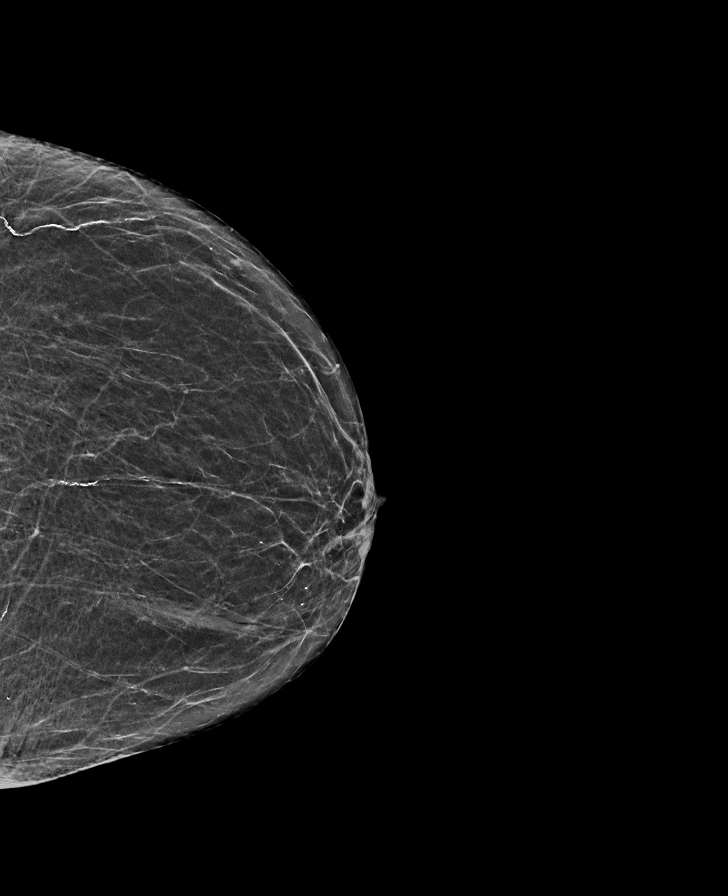

[8 of 29 positions shown; findings below may reference images not displayed]

FINDINGS: There are no findings suspicious for malignancy. Images were
processed with CAD.
IMPRESSION: No mammographic evidence of malignancy. A result letter of this
screening mammogram will be mailed directly to the patient.

RECOMMENDATION:
Screening mammogram in one year. (Code:1B-X-8P0)

BI-RADS CATEGORY  1: Negative.

## 2016-04-09 DIAGNOSIS — D649 Anemia, unspecified: Secondary | ICD-10-CM | POA: Insufficient documentation

## 2016-08-12 DIAGNOSIS — Z8739 Personal history of other diseases of the musculoskeletal system and connective tissue: Secondary | ICD-10-CM | POA: Insufficient documentation

## 2016-11-09 ENCOUNTER — Other Ambulatory Visit: Payer: Self-pay | Admitting: Internal Medicine

## 2016-11-09 DIAGNOSIS — Z1231 Encounter for screening mammogram for malignant neoplasm of breast: Secondary | ICD-10-CM

## 2016-12-21 ENCOUNTER — Ambulatory Visit
Admission: RE | Admit: 2016-12-21 | Discharge: 2016-12-21 | Disposition: A | Discharge status: Home / Self Care | Payer: Medicare HMO | Source: Ambulatory Visit | Attending: Internal Medicine | Admitting: Internal Medicine

## 2016-12-21 ENCOUNTER — Encounter: Payer: Self-pay | Admitting: Radiology

## 2016-12-21 DIAGNOSIS — Z1231 Encounter for screening mammogram for malignant neoplasm of breast: Secondary | ICD-10-CM | POA: Diagnosis present

## 2016-12-21 IMAGING — MG 2D DIGITAL SCREENING BILATERAL MAMMOGRAM WITH CAD AND ADJUNCT TO
8 of 12 series · 8 of 28 positions shown · non-contrast
Comparison: Previous exam(s).

ACR Breast Density Category a: The breast tissue is almost entirely
fatty.

CLINICAL DATA: Screening.

EXAM:
2D DIGITAL SCREENING BILATERAL MAMMOGRAM WITH CAD AND ADJUNCT TOMO

[R CC]
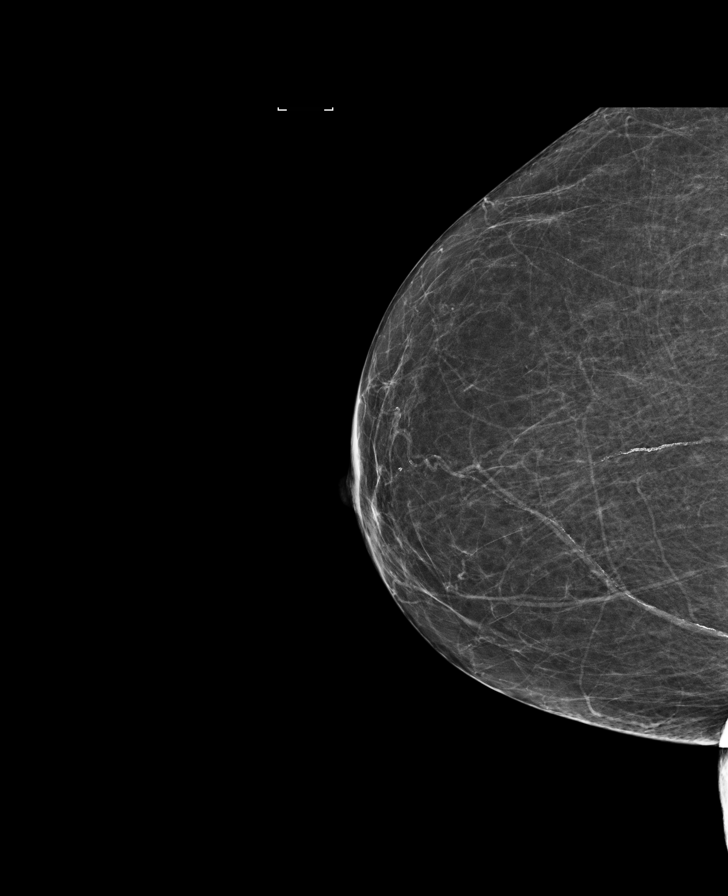

[L MLO]
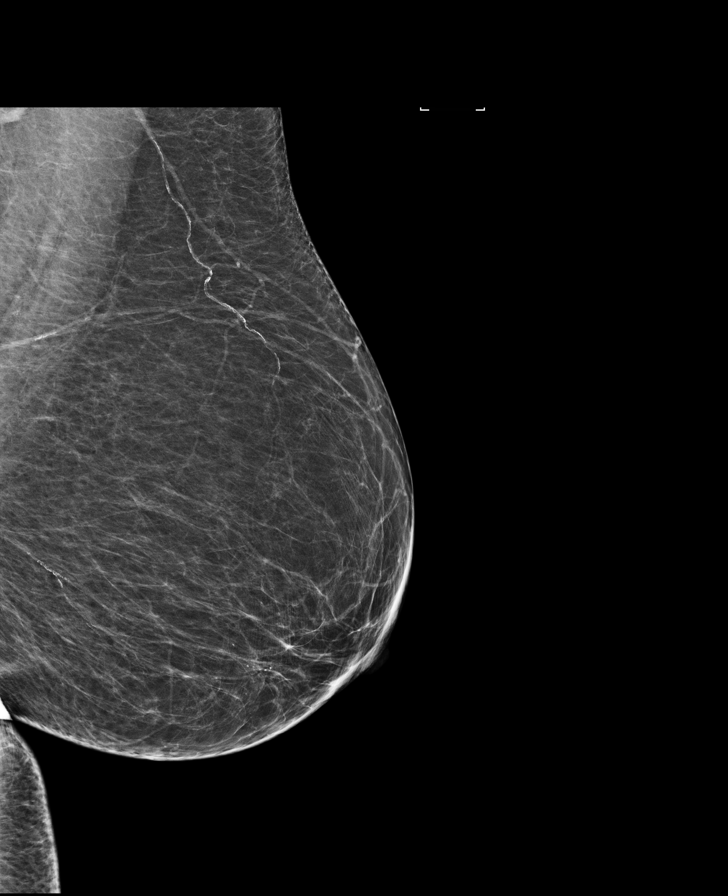

[R CC synth-2D]
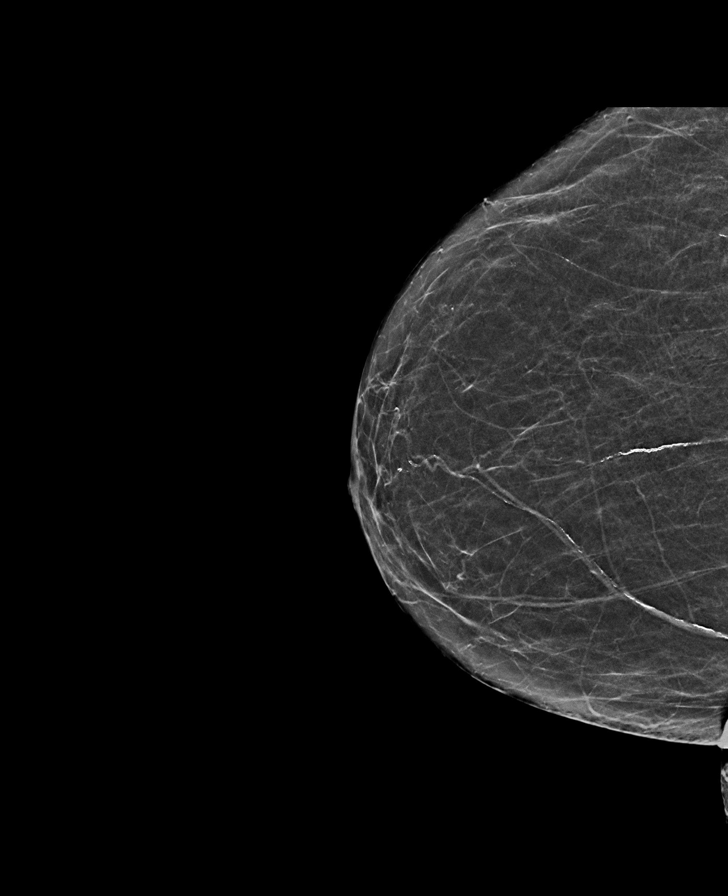

[L MLO synth-2D]
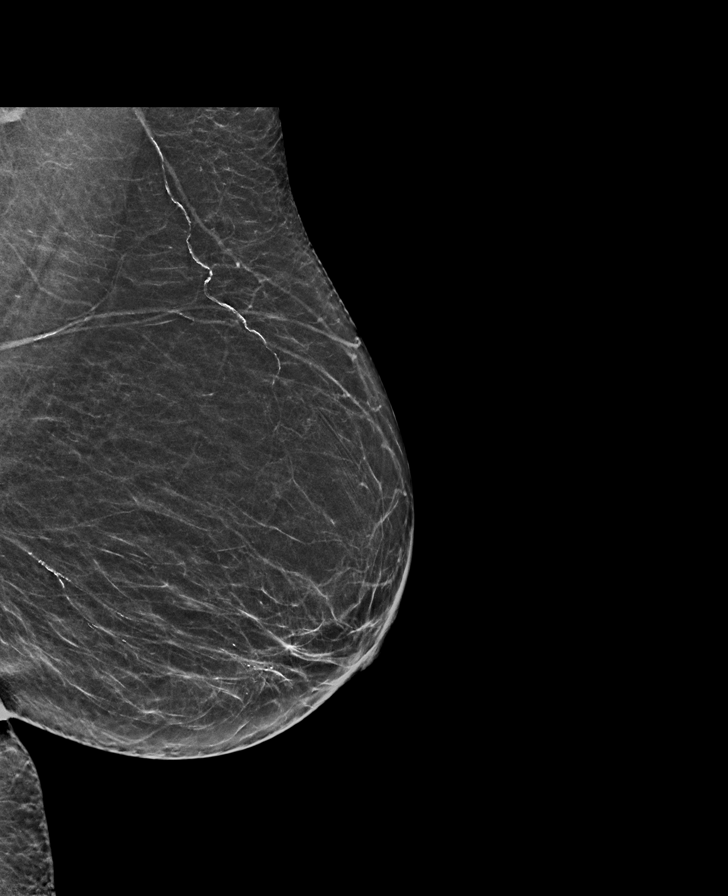

[L CC synth-2D]
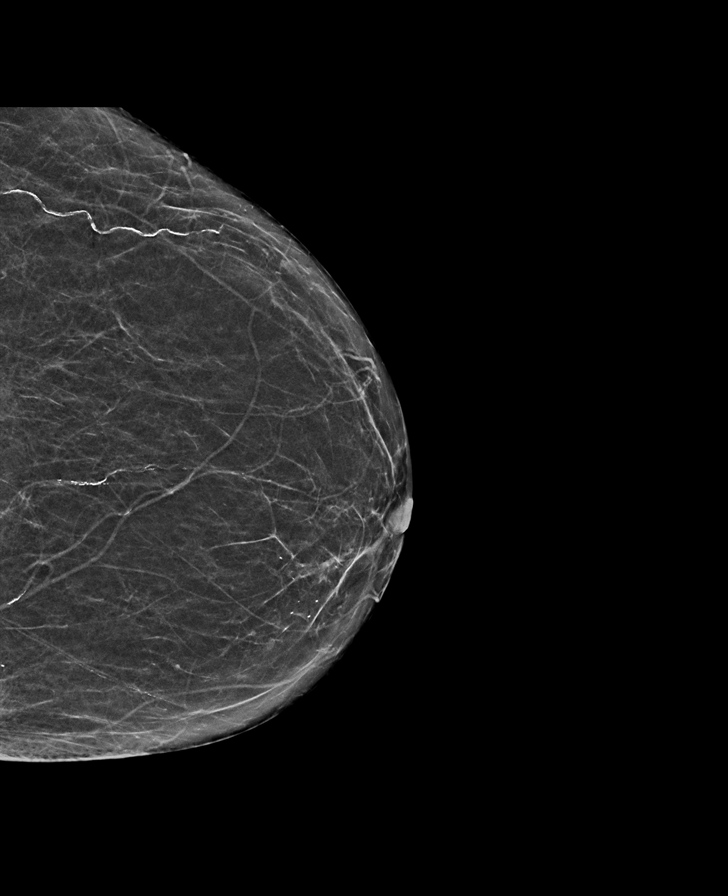

[R MLO synth-2D]
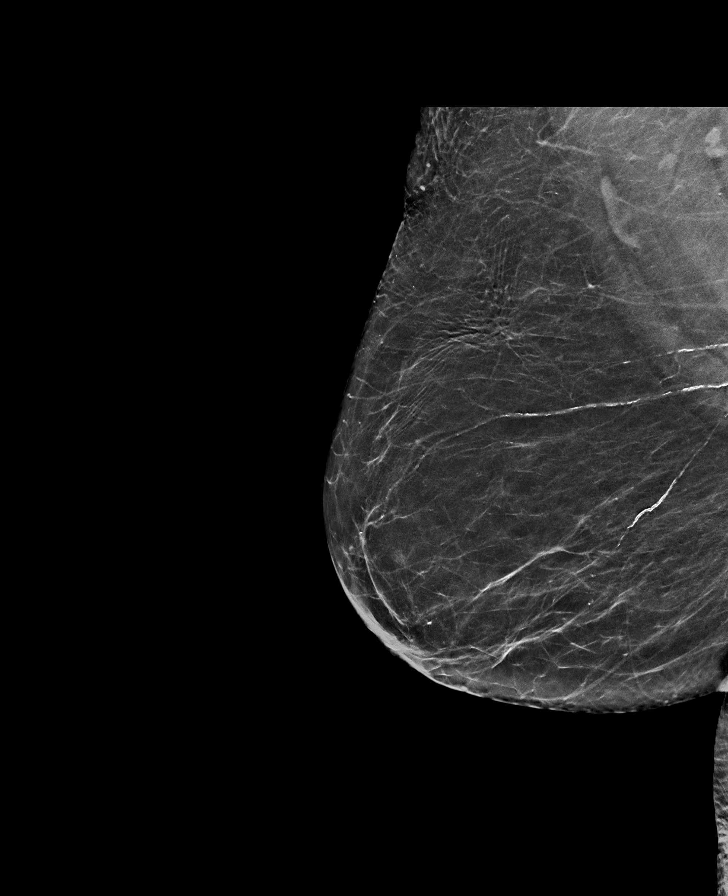

[L CC]
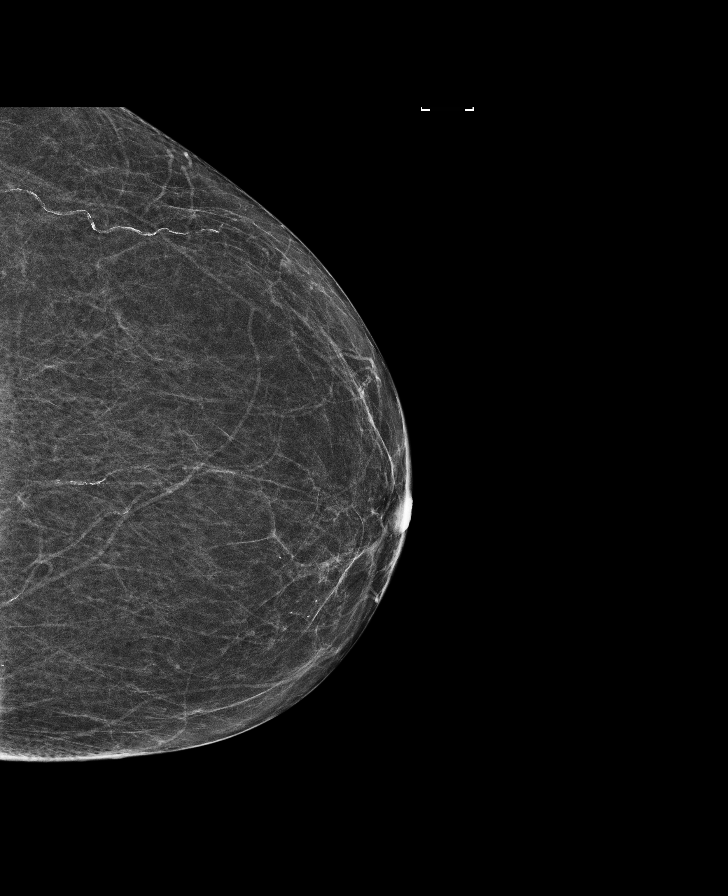

[R MLO]
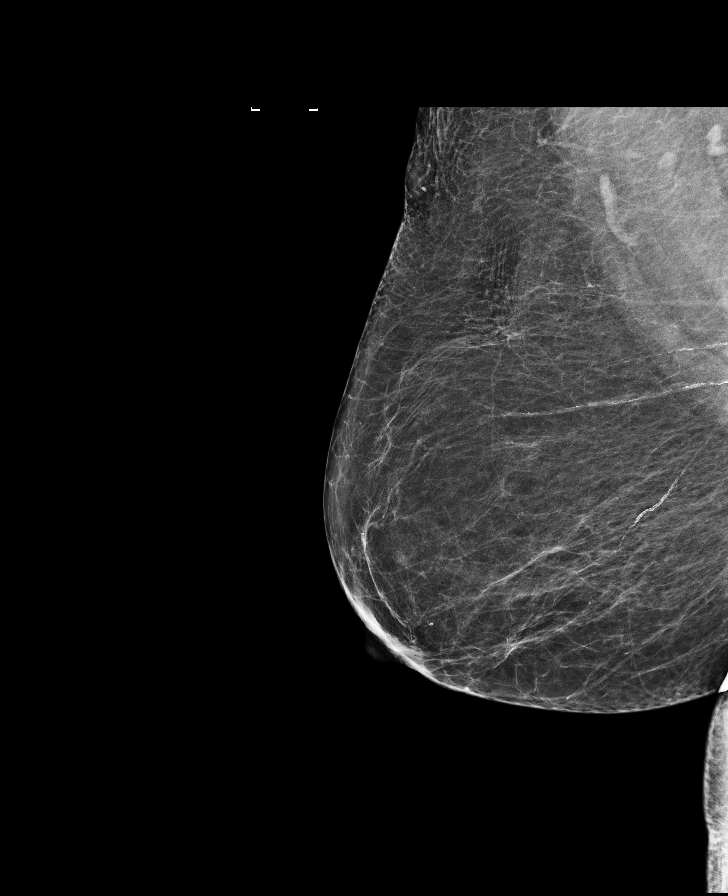

[8 of 28 positions shown; findings below may reference images not displayed]

FINDINGS: There are no findings suspicious for malignancy. Images were
processed with CAD.
IMPRESSION: No mammographic evidence of malignancy. A result letter of this
screening mammogram will be mailed directly to the patient.

RECOMMENDATION:
Screening mammogram in one year. (Code:1B-X-8P0)

BI-RADS CATEGORY  1: Negative.

## 2017-07-19 DIAGNOSIS — K279 Peptic ulcer, site unspecified, unspecified as acute or chronic, without hemorrhage or perforation: Secondary | ICD-10-CM | POA: Insufficient documentation

## 2017-07-19 DIAGNOSIS — E119 Type 2 diabetes mellitus without complications: Secondary | ICD-10-CM | POA: Insufficient documentation

## 2017-07-19 DIAGNOSIS — I10 Essential (primary) hypertension: Secondary | ICD-10-CM | POA: Insufficient documentation

## 2017-07-19 DIAGNOSIS — E785 Hyperlipidemia, unspecified: Secondary | ICD-10-CM | POA: Insufficient documentation

## 2017-07-20 ENCOUNTER — Ambulatory Visit (INDEPENDENT_AMBULATORY_CARE_PROVIDER_SITE_OTHER): Payer: Medicare HMO

## 2017-07-20 ENCOUNTER — Ambulatory Visit: Payer: Self-pay | Admitting: Podiatry

## 2017-07-20 ENCOUNTER — Encounter: Payer: Self-pay | Admitting: Podiatry

## 2017-07-20 VITALS — BP 164/72 | HR 76 | Resp 16

## 2017-07-20 DIAGNOSIS — M1A9XX1 Chronic gout, unspecified, with tophus (tophi): Secondary | ICD-10-CM | POA: Diagnosis not present

## 2017-07-20 DIAGNOSIS — M109 Gout, unspecified: Secondary | ICD-10-CM | POA: Diagnosis not present

## 2017-07-20 DIAGNOSIS — M2041 Other hammer toe(s) (acquired), right foot: Secondary | ICD-10-CM

## 2017-07-20 DIAGNOSIS — M2042 Other hammer toe(s) (acquired), left foot: Secondary | ICD-10-CM

## 2017-07-20 NOTE — Progress Notes (Signed)
Subjective:  Patient ID: Cassandra Schaefer, female    DOB: July 25, 1928,  MRN: [id number] HPI Chief Complaint  Patient presents with  . Toe Pain    4th toe left - toe has been red, swollen and draining x 2 years, dx with gout, has had it drained at [facility] clinic, takes allopurinol daily, won't heal completely-2nd opinion  . Diabetes    Last a1c unknown but states "usually good"  . New Patient (Initial Visit)    84 y.o. female presents with the above complaint.  ROS: She denies fever chills nausea vomiting muscle aches pains itching or rash.  She denies calf pain chest pain shortness of breath headache.  Past Medical History:  Diagnosis Date  . Anemia   . Cough    RESOLVING, NO FEVER  . Diabetes mellitus without complication (HCC)   . Gout   . Hypertension   . Hypothyroidism   . PUD (peptic ulcer disease)    Past Surgical History:  Procedure Laterality Date  . APPENDECTOMY    . CATARACT EXTRACTION W/PHACO Right 06/23/2015   Procedure: CATARACT EXTRACTION PHACO AND INTRAOCULAR LENS PLACEMENT (IOC);  Surgeon: Sallee Lange, MD;  Location: [facility] ORS;  Service: Ophthalmology;  Laterality: Right;  [place] 01:28 AP% 23.4 CDE 36.14 fluid pack lot # [id number]  . CATARACT EXTRACTION W/PHACO Left 07/28/2015   Procedure: CATARACT EXTRACTION PHACO AND INTRAOCULAR LENS PLACEMENT (IOC);  Surgeon: Sallee Lange, MD;  Location: [facility] ORS;  Service: Ophthalmology;  Laterality: Left;  [place]    1:29.7 AP%  24.9 CDE   41.32 fluid casette lot #[id number] H exp 09/23/2016  . COONOSCOPY     AND ENDOSCOPY  . DILATION AND CURETTAGE OF UTERUS    . TONSILLECTOMY    . TUBAL LIGATION      Current Outpatient Medications:  .  colchicine 0.6 MG tablet, Take by mouth., Disp: , Rfl:  .  glucose blood (ACCU-CHEK COMPACT PLUS) test strip, USE TO TEST BLOOD SUGAR DAILY. DX CODE: E11.9, Disp: , Rfl:  .  iron polysaccharides (FERREX 150) 150 MG capsule, Take by mouth., Disp: , Rfl:  .  allopurinol (ZYLOPRIM) 100 MG  tablet, Take 100 mg by mouth daily., Disp: , Rfl:  .  fosinopril (MONOPRIL) 10 MG tablet, Take 10 mg by mouth at bedtime., Disp: , Rfl:  .  levothyroxine (SYNTHROID, LEVOTHROID) 88 MCG tablet, Take 88 mcg by mouth daily before breakfast., Disp: , Rfl:  .  metFORMIN (GLUCOPHAGE-XR) 500 MG 24 hr tablet, Take 500 mg by mouth 2 (two) times daily. 2 PILLS AM   1 PILL PM, Disp: , Rfl:  .  Multiple Vitamins-Minerals (ICAPS AREDS 2 PO), Take by mouth 2 (two) times daily. Reported on 06/23/2015, Disp: , Rfl:  .  simvastatin (ZOCOR) 20 MG tablet, Take 20 mg by mouth daily., Disp: , Rfl:  .  vitamin B-12 (CYANOCOBALAMIN) 1000 MCG tablet, Take 2,000 mcg by mouth daily. , Disp: , Rfl:   Allergies  Allergen Reactions  . Penicillins Hives  . Sulfa Antibiotics Hives  . Tetanus Toxoids Swelling  . Procaine Other (See Comments)  . Tuberculin Tests    Review of Systems Objective:   Vitals:   07/20/17 0910  BP: (!) 164/72  Pulse: 76  Resp: 16    General: Well developed, nourished, in no acute distress, alert and oriented x3   Dermatological: Skin is warm, dry and supple bilateral. Nails x 10 are well maintained; remaining integument appears unremarkable at this time. There are no open  sores, no preulcerative lesions, no rash or signs of infection present.  Vascular: Dorsalis Pedis artery and Posterior Tibial artery pedal pulses are 2/4 bilateral with immedate capillary fill time. Pedal hair growth present. No varicosities and no lower extremity edema present bilateral.   Neruologic: Grossly intact via light touch bilateral. Vibratory intact via tuning fork bilateral. Protective threshold with Semmes Wienstein monofilament intact to all pedal sites bilateral. Patellar and Achilles deep tendon reflexes 2+ bilateral. No Babinski or clonus noted bilateral.   Musculoskeletal: No gross boney pedal deformities bilateral. No pain, crepitus, or limitation noted with foot and ankle range of motion bilateral.  Muscular strength 5/5 in all groups tested bilateral.  Fourth toe left foot demonstrates mild erythema distal to a small hole which is draining tophaceous serum.  There is no ascending cellulitis but the toe is tender to the touch and is very flail.  Gait: Unassisted, Nonantalgic.    Radiographs:  Radiographs taken today demonstrate fourth toe left foot absence of a middle phalanx more than likely secondary to gout or osseous infection such as osteomyelitis.  Assessment & Plan:   Assessment: Chronic gout fourth toe left foot  Plan: Discussed the need for amputation we will follow-up with her in a few weeks.  Recommended that she bring her daughter who is a Engineer, civil (consulting)nurse at [employer].     Coline Calkin T. WoodmereHyatt, [place] Dakota[place]PM

## 2017-11-30 ENCOUNTER — Other Ambulatory Visit: Payer: Self-pay | Admitting: Podiatry

## 2017-12-01 ENCOUNTER — Other Ambulatory Visit: Payer: Self-pay | Admitting: Podiatry

## 2017-12-06 ENCOUNTER — Other Ambulatory Visit: Payer: Self-pay

## 2017-12-06 ENCOUNTER — Encounter: Payer: Self-pay | Admitting: *Deleted

## 2017-12-08 NOTE — Discharge Instructions (Signed)
[facility] °[facility] ° °POST OPERATIVE INSTRUCTIONS FOR DR. TROXLER AND DR. FOWLER °[facility] ° ° °1. Take your medication as prescribed.  Pain medication should be taken only as needed. ° °2. Keep the dressing clean, dry and intact. ° °3. Keep your foot elevated above the heart level for the first 48 hours. ° °4. Walking to the bathroom and brief periods of walking are acceptable, unless we have instructed you to be non-weight bearing. ° °5. Always wear your post-op shoe when walking.  Always use your crutches if you are to be non-weight bearing. ° °6. Do not take a shower. Baths are permissible as long as the foot is kept out of the water.  ° °7. Every hour you are awake:  °- Bend your knee 15 times. °- Flex foot 15 times °- Massage calf 15 times ° °8. Call [facility] ([phone]) if any of the following problems occur: °- You develop a temperature or fever. °- The bandage becomes saturated with blood. °- Medication does not stop your pain. °- Injury of the foot occurs. °- Any symptoms of infection including redness, odor, or red streaks running from wound. ° ° °General Anesthesia, Adult, Care After °These instructions provide you with information about caring for yourself after your procedure. Your health care provider may also give you more specific instructions. Your treatment has been planned according to current medical practices, but problems sometimes occur. Call your health care provider if you have any problems or questions after your procedure. °What can I expect after the procedure? °After the procedure, it is common to have: °· Vomiting. °· A sore throat. °· Mental slowness. ° °It is common to feel: °· Nauseous. °· Cold or shivery. °· Sleepy. °· Tired. °· Sore or achy, even in parts of your body where you did not have surgery. ° °Follow these instructions at home: °For at least 24 hours after the procedure: °· Do not: °? Participate in  activities where you could fall or become injured. °? Drive. °? Use heavy machinery. °? Drink alcohol. °? Take sleeping pills or medicines that cause drowsiness. °? Make important decisions or sign legal documents. °? Take care of children on your own. °· Rest. °Eating and drinking °· If you vomit, drink water, juice, or soup when you can drink without vomiting. °· Drink enough fluid to keep your urine clear or pale yellow. °· Make sure you have little or no nausea before eating solid foods. °· Follow the diet recommended by your health care provider. °General instructions °· Have a responsible adult stay with you until you are awake and alert. °· Return to your normal activities as told by your health care provider. Ask your health care provider what activities are safe for you. °· Take over-the-counter and prescription medicines only as told by your health care provider. °· If you smoke, do not smoke without supervision. °· Keep all follow-up visits as told by your health care provider. This is important. °Contact a health care provider if: °· You continue to have nausea or vomiting at home, and medicines are not helpful. °· You cannot drink fluids or start eating again. °· You cannot urinate after 8-12 hours. °· You develop a skin rash. °· You have fever. °· You have increasing redness at the site of your procedure. °Get help right away if: °· You have difficulty breathing. °· You have chest pain. °· You have unexpected bleeding. °· You feel that you   are having a life-threatening or urgent problem. °This information is not intended to replace advice given to you by your health care provider. Make sure you discuss any questions you have with your health care provider. °Document Released: 07/19/2000 Document Revised: 09/15/2015 Document Reviewed: 03/27/2015 °Elsevier Interactive Patient Education © 2018 Elsevier Inc. ° °

## 2017-12-14 ENCOUNTER — Encounter
Admission: RE | Disposition: A | Discharge status: Home / Self Care | Payer: Self-pay | Source: Ambulatory Visit | Attending: Podiatry

## 2017-12-14 ENCOUNTER — Ambulatory Visit
Admission: RE | Admit: 2017-12-14 | Discharge: 2017-12-14 | Disposition: A | Discharge status: Home / Self Care | Payer: Medicare HMO | Source: Ambulatory Visit | Attending: Podiatry | Admitting: Podiatry

## 2017-12-14 ENCOUNTER — Ambulatory Visit: Payer: Medicare HMO | Admitting: Anesthesiology

## 2017-12-14 DIAGNOSIS — Z887 Allergy status to serum and vaccine status: Secondary | ICD-10-CM | POA: Insufficient documentation

## 2017-12-14 DIAGNOSIS — E785 Hyperlipidemia, unspecified: Secondary | ICD-10-CM | POA: Insufficient documentation

## 2017-12-14 DIAGNOSIS — E039 Hypothyroidism, unspecified: Secondary | ICD-10-CM | POA: Diagnosis not present

## 2017-12-14 DIAGNOSIS — Z87891 Personal history of nicotine dependence: Secondary | ICD-10-CM | POA: Diagnosis not present

## 2017-12-14 DIAGNOSIS — I10 Essential (primary) hypertension: Secondary | ICD-10-CM | POA: Insufficient documentation

## 2017-12-14 DIAGNOSIS — Z8249 Family history of ischemic heart disease and other diseases of the circulatory system: Secondary | ICD-10-CM | POA: Diagnosis not present

## 2017-12-14 DIAGNOSIS — Z880 Allergy status to penicillin: Secondary | ICD-10-CM | POA: Diagnosis not present

## 2017-12-14 DIAGNOSIS — Z882 Allergy status to sulfonamides status: Secondary | ICD-10-CM | POA: Diagnosis not present

## 2017-12-14 DIAGNOSIS — Z79899 Other long term (current) drug therapy: Secondary | ICD-10-CM | POA: Diagnosis not present

## 2017-12-14 DIAGNOSIS — M1A9XX1 Chronic gout, unspecified, with tophus (tophi): Secondary | ICD-10-CM | POA: Diagnosis present

## 2017-12-14 DIAGNOSIS — Z8711 Personal history of peptic ulcer disease: Secondary | ICD-10-CM | POA: Insufficient documentation

## 2017-12-14 DIAGNOSIS — E119 Type 2 diabetes mellitus without complications: Secondary | ICD-10-CM | POA: Diagnosis not present

## 2017-12-14 DIAGNOSIS — Z7984 Long term (current) use of oral hypoglycemic drugs: Secondary | ICD-10-CM | POA: Insufficient documentation

## 2017-12-14 DIAGNOSIS — Z884 Allergy status to anesthetic agent status: Secondary | ICD-10-CM | POA: Diagnosis not present

## 2017-12-14 HISTORY — DX: Presence of dental prosthetic device (complete) (partial): Z97.2

## 2017-12-14 HISTORY — PX: AMPUTATION TOE: SHX6595

## 2017-12-14 LAB — GLUCOSE, CAPILLARY
Glucose-Capillary: 121 mg/dL — ABNORMAL HIGH (ref 70–99)
Glucose-Capillary: 112 mg/dL — ABNORMAL HIGH (ref 70–99)

## 2017-12-14 SURGERY — AMPUTATION, TOE
Anesthesia: General | Site: Toe | Laterality: Left | Wound class: Clean

## 2017-12-14 MED ORDER — CLINDAMYCIN PHOSPHATE 900 MG/50ML IV SOLN: 900.0000 mg | INTRAVENOUS | Status: DC

## 2017-12-14 MED ORDER — FENTANYL CITRATE (PF) 100 MCG/2ML IJ SOLN: 25.0000 ug | INTRAMUSCULAR | Status: DC | PRN

## 2017-12-14 MED ORDER — MIDAZOLAM HCL 2 MG/2ML IJ SOLN
INTRAMUSCULAR | Status: DC | PRN
  Administered 2017-12-14: 1 mg via INTRAVENOUS

## 2017-12-14 MED ORDER — CLINDAMYCIN PHOSPHATE 600 MG/50ML IV SOLN
600.0000 mg | INTRAVENOUS | Status: AC
  Administered 2017-12-14: 600 mg via INTRAVENOUS

## 2017-12-14 MED ORDER — BUPIVACAINE HCL 0.25 % IJ SOLN
INTRAMUSCULAR | Status: DC | PRN
  Administered 2017-12-14: 2.5 mL

## 2017-12-14 MED ORDER — HYDROCODONE-ACETAMINOPHEN 5-325 MG PO TABS: 1.0000 | ORAL_TABLET | Freq: Four times a day (QID) | ORAL | 0 refills | Status: DC | PRN

## 2017-12-14 MED ORDER — PROPOFOL 500 MG/50ML IV EMUL
INTRAVENOUS | Status: DC | PRN
  Administered 2017-12-14: 50 ug/kg/min via INTRAVENOUS

## 2017-12-14 MED ORDER — LIDOCAINE HCL (CARDIAC) PF 100 MG/5ML IV SOSY
PREFILLED_SYRINGE | INTRAVENOUS | Status: DC | PRN
  Administered 2017-12-14: 40 mg via INTRAVENOUS

## 2017-12-14 MED ORDER — ONDANSETRON HCL 4 MG/2ML IJ SOLN: 4.0000 mg | Freq: Once | INTRAMUSCULAR | Status: DC | PRN

## 2017-12-14 MED ORDER — LACTATED RINGERS IV SOLN
INTRAVENOUS | Status: DC
  Administered 2017-12-14: 12:00:00 via INTRAVENOUS

## 2017-12-14 MED ORDER — POVIDONE-IODINE 7.5 % EX SOLN: Freq: Once | CUTANEOUS | Status: DC

## 2017-12-14 MED ORDER — FENTANYL CITRATE (PF) 100 MCG/2ML IJ SOLN
INTRAMUSCULAR | Status: DC | PRN
  Administered 2017-12-14: 50 ug via INTRAVENOUS

## 2017-12-14 MED ORDER — LIDOCAINE-EPINEPHRINE 1 %-1:100000 IJ SOLN
INTRAMUSCULAR | Status: DC | PRN
  Administered 2017-12-14: 2.5 mL

## 2017-12-14 MED ORDER — CHLORHEXIDINE GLUCONATE 4 % EX LIQD: 60.0000 mL | Freq: Once | CUTANEOUS | Status: DC

## 2017-12-14 SURGICAL SUPPLY — 26 items
BANDAGE ELASTIC 4 LF NS (GAUZE/BANDAGES/DRESSINGS) ×2 IMPLANT
BENZOIN TINCTURE PRP APPL 2/3 (GAUZE/BANDAGES/DRESSINGS) ×2 IMPLANT
BNDG COHESIVE 4X5 TAN STRL (GAUZE/BANDAGES/DRESSINGS) ×2 IMPLANT
BNDG ESMARK 6X12 TAN STRL LF (GAUZE/BANDAGES/DRESSINGS) ×2 IMPLANT
BNDG GAUZE 4.5X4.1 6PLY STRL (MISCELLANEOUS) ×2 IMPLANT
BNDG STRETCH 4X75 STRL LF (GAUZE/BANDAGES/DRESSINGS) ×2 IMPLANT
CANISTER SUCT 1200ML W/VALVE (MISCELLANEOUS) ×2 IMPLANT
DURAPREP 26ML APPLICATOR (WOUND CARE) ×2 IMPLANT
ELECT REM PT RETURN 9FT ADLT (ELECTROSURGICAL) ×2
ELECTRODE REM PT RTRN 9FT ADLT (ELECTROSURGICAL) ×1 IMPLANT
GAUZE PETRO XEROFOAM 1X8 (MISCELLANEOUS) ×2 IMPLANT
GAUZE SPONGE 4X4 12PLY STRL (GAUZE/BANDAGES/DRESSINGS) ×2 IMPLANT
GLOVE BIO SURGEON STRL SZ7.5 (GLOVE) ×2 IMPLANT
GLOVE INDICATOR 8.0 STRL GRN (GLOVE) ×2 IMPLANT
GOWN STRL REUS W/ TWL LRG LVL3 (GOWN DISPOSABLE) ×2 IMPLANT
GOWN STRL REUS W/TWL LRG LVL3 (GOWN DISPOSABLE) ×2
KIT TURNOVER KIT A (KITS) ×2 IMPLANT
NEEDLE HYPO 18GX1.5 BLUNT FILL (NEEDLE) ×2 IMPLANT
NEEDLE HYPO 25GX1X1/2 BEV (NEEDLE) ×2 IMPLANT
PACK EXTREMITY ARMC (MISCELLANEOUS) ×2 IMPLANT
PENCIL SMOKE EVACUATOR (MISCELLANEOUS) ×2 IMPLANT
RASP SM TEAR CROSS CUT (RASP) ×2 IMPLANT
STOCKINETTE IMPERVIOUS LG (DRAPES) ×2 IMPLANT
SUT ETHILON 3-0 FS-10 30 BLK (SUTURE) ×2
SUTURE EHLN 3-0 FS-10 30 BLK (SUTURE) ×1 IMPLANT
SYR 10ML LL (SYRINGE) ×2 IMPLANT

## 2017-12-14 NOTE — Op Note (Signed)
Operative note   Surgeon:Gregori Abril [employer] logistics/support/administrative officerowler    Assistant: None    Preop diagnosis: Possible osteomyelitis versus gouty arthritis left fourth toe    Postop diagnosis: Same    Procedure: Amputation left fourth toe    EBL: Minimal    Anesthesia:local and IV sedation.  Local consisted of a one-to-one mixture of 0.25% bupivacaine plain and 1% lidocaine with epinephrine.  A total of 5 cc was used    Hemostasis: Epinephrine 1-200,000    Specimen: Amputated left fourth toe    Complications: None    Operative indications:Cassandra Schaefer is an 84 y.o. that presents today for surgical intervention.  The risks/benefits/alternatives/complications have been discussed and consent has been given.    Procedure:  Patient was brought into the OR and placed on the operating table in thesupine position. After anesthesia was obtained theleft lower extremity was prepped and draped in usual sterile fashion.  Attention was directed to the left fourth toe where 2 semielliptical incisions were made at the base of the fourth toe at the webspace.  Full-thickness incision was taken down to the joint.  The toe was disarticulated and removed from the surgical field in toto.  This was sent for pathological examination.  Grossly there was no signs of any infection in the region.  The wound was flushed with copious amounts of irrigation.  Closure was then performed with a 3-0 nylon.  A bulky sterile dressing was applied.    Patient tolerated the procedure and anesthesia well.  Was transported from the OR to the PACU with all vital signs stable and vascular status intact. To be discharged per routine protocol.  Will follow up in approximately 1 week in the outpatient clinic.

## 2017-12-14 NOTE — Transfer of Care (Signed)
Immediate Anesthesia Transfer of Care Note  Patient: Cassandra Schaefer  Procedure(s) Performed: AMPUTATION TOE-4TH MPJ (Left )  Patient Location: PACU  Anesthesia Type: General  Level of Consciousness: awake, alert  and patient cooperative  Airway and Oxygen Therapy: Patient Spontanous Breathing and Patient connected to supplemental oxygen  Post-op Assessment: Post-op Vital signs reviewed, Patient's Cardiovascular Status Stable, Respiratory Function Stable, Patent Airway and No signs of Nausea or vomiting  Post-op Vital Signs: Reviewed and stable  Complications: No apparent anesthesia complications

## 2017-12-14 NOTE — Anesthesia Postprocedure Evaluation (Signed)
Anesthesia Post Note  Patient: Cassandra Schaefer  Procedure(s) Performed: AMPUTATION TOE-4TH MPJ (Left Toe)  Patient location during evaluation: PACU Anesthesia Type: General Level of consciousness: awake and alert and oriented Pain management: satisfactory to patient Vital Signs Assessment: post-procedure vital signs reviewed and stable Respiratory status: spontaneous breathing, nonlabored ventilation and respiratory function stable Cardiovascular status: blood pressure returned to baseline and stable Postop Assessment: Adequate PO intake and No signs of nausea or vomiting Anesthetic complications: no    Cassandra Schaefer, Cassandra Schaefer

## 2017-12-14 NOTE — H&P (Signed)
  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  The various methods of treatment have been discussed with the patient.  No guarantees were given.  After consideration of risks, benefits and other options for treatment, the patient has consented to surgery.  I have reviewed the patients' chart and labs.     A history and physical examination was performed in my office.  The patient was reexamined.  There have been no changes to this history and physical examination.  Gwyneth RevelsFowler, Joas Motton A

## 2017-12-14 NOTE — Anesthesia Preprocedure Evaluation (Signed)
Anesthesia Evaluation  Patient identified by MRN, date of birth, ID band Patient awake    Reviewed: Allergy & Precautions, H&P , NPO status , Patient's Chart, lab work & pertinent test results  Airway Mallampati: II  TM Distance: >3 FB Neck ROM: full    Dental no notable dental hx.    Pulmonary former smoker,    Pulmonary exam normal breath sounds clear to auscultation       Cardiovascular hypertension, Normal cardiovascular exam Rhythm:regular Rate:Normal     Neuro/Psych    GI/Hepatic   Endo/Other  diabetes, Type 2Hypothyroidism   Renal/GU      Musculoskeletal   Abdominal   Peds  Hematology   Anesthesia Other Findings   Reproductive/Obstetrics                             Anesthesia Physical Anesthesia Plan  ASA: II  Anesthesia Plan: General   Post-op Pain Management:    Induction: Intravenous  PONV Risk Score and Plan: 3 and Treatment may vary due to age or medical condition, Propofol infusion and Midazolam  Airway Management Planned: Natural Airway  Additional Equipment:   Intra-op Plan:   Post-operative Plan:   Informed Consent: I have reviewed the patients History and Physical, chart, labs and discussed the procedure including the risks, benefits and alternatives for the proposed anesthesia with the patient or authorized representative who has indicated his/her understanding and acceptance.     Plan Discussed with: CRNA  Anesthesia Plan Comments:         Anesthesia Quick Evaluation

## 2017-12-14 NOTE — Anesthesia Procedure Notes (Signed)
Performed by: Jimya Ciani, CRNA Pre-anesthesia Checklist: Patient identified, Emergency Drugs available, Suction available, Timeout performed and Patient being monitored Patient Re-evaluated:Patient Re-evaluated prior to induction Oxygen Delivery Method: Simple face mask Placement Confirmation: positive ETCO2       

## 2017-12-15 ENCOUNTER — Encounter: Payer: Self-pay | Admitting: Podiatry

## 2017-12-18 LAB — SURGICAL PATHOLOGY

## 2018-01-27 ENCOUNTER — Other Ambulatory Visit: Payer: Self-pay | Admitting: Internal Medicine

## 2018-01-27 DIAGNOSIS — Z1231 Encounter for screening mammogram for malignant neoplasm of breast: Secondary | ICD-10-CM

## 2018-02-16 ENCOUNTER — Ambulatory Visit
Admission: RE | Admit: 2018-02-16 | Discharge: 2018-02-16 | Disposition: A | Discharge status: Home / Self Care | Payer: Medicare HMO | Source: Ambulatory Visit | Attending: Internal Medicine | Admitting: Internal Medicine

## 2018-02-16 DIAGNOSIS — Z1231 Encounter for screening mammogram for malignant neoplasm of breast: Secondary | ICD-10-CM | POA: Diagnosis not present

## 2018-02-16 IMAGING — MG DIGITAL SCREENING BILATERAL MAMMOGRAM WITH TOMO AND CAD
6 of 10 series · 6 of 30 positions shown · non-contrast
Comparison: Previous exam(s).

CLINICAL DATA: Screening.

EXAM:
DIGITAL SCREENING BILATERAL MAMMOGRAM WITH TOMO AND CAD

[R MLO synth-2D (1 of 2)]
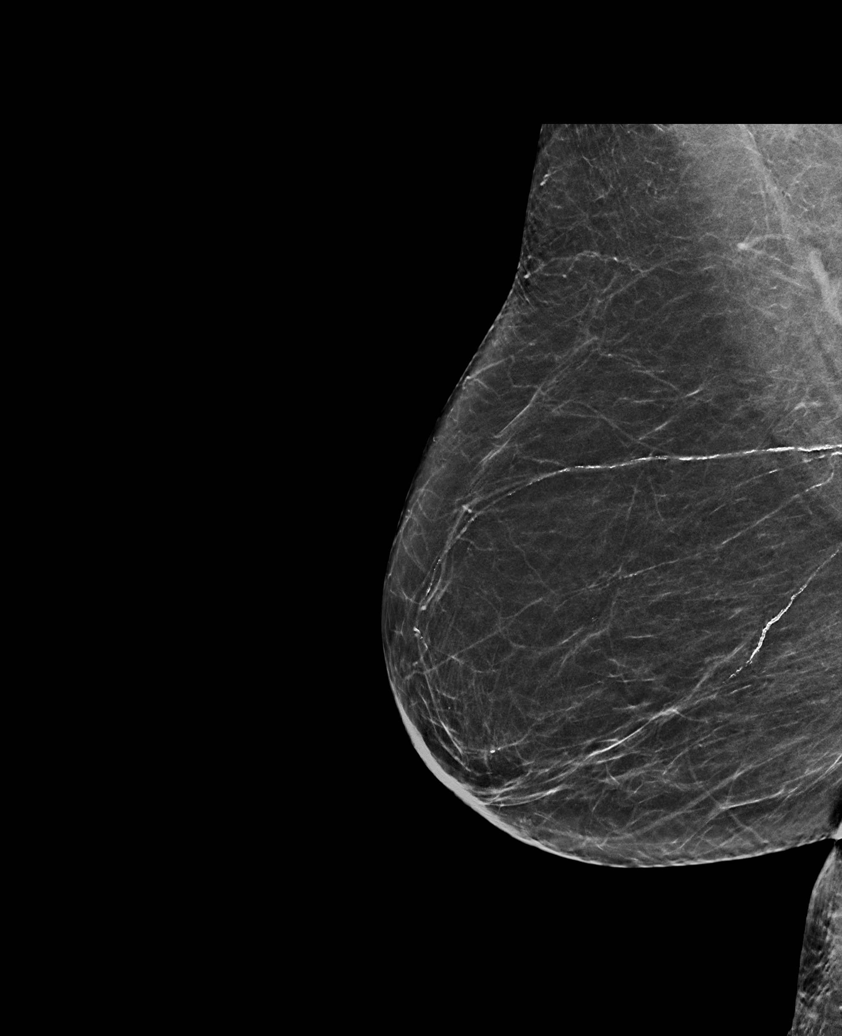

[R MLO synth-2D (2 of 2)]
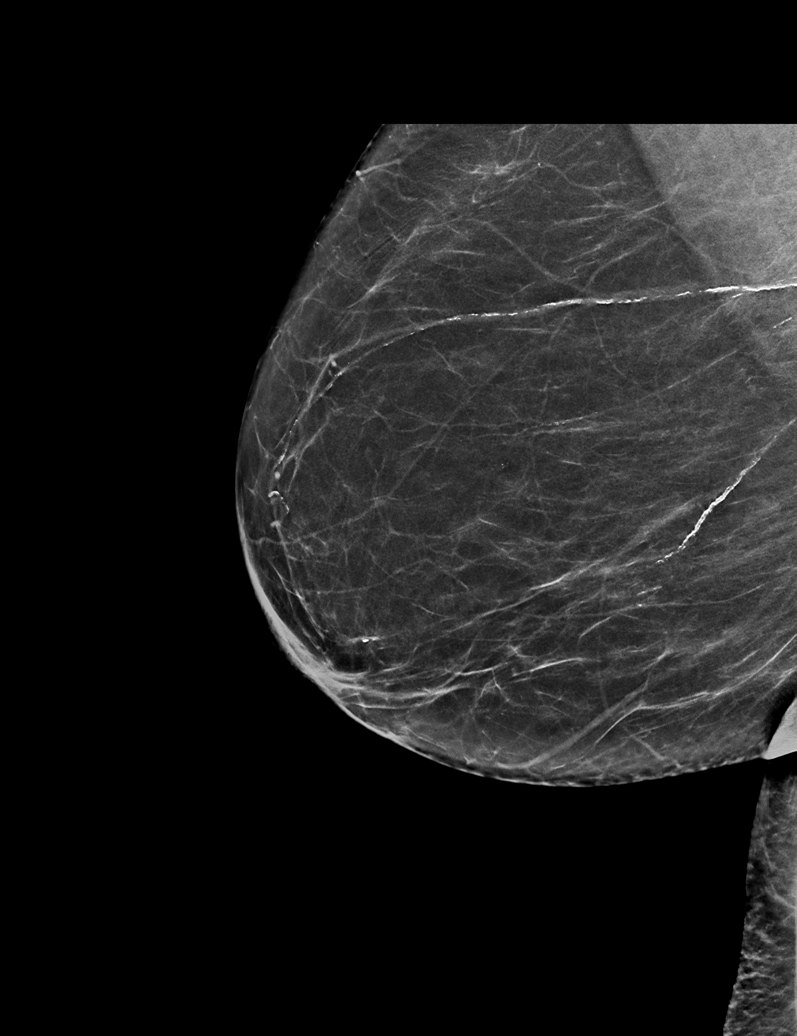

[L MLO synth-2D]
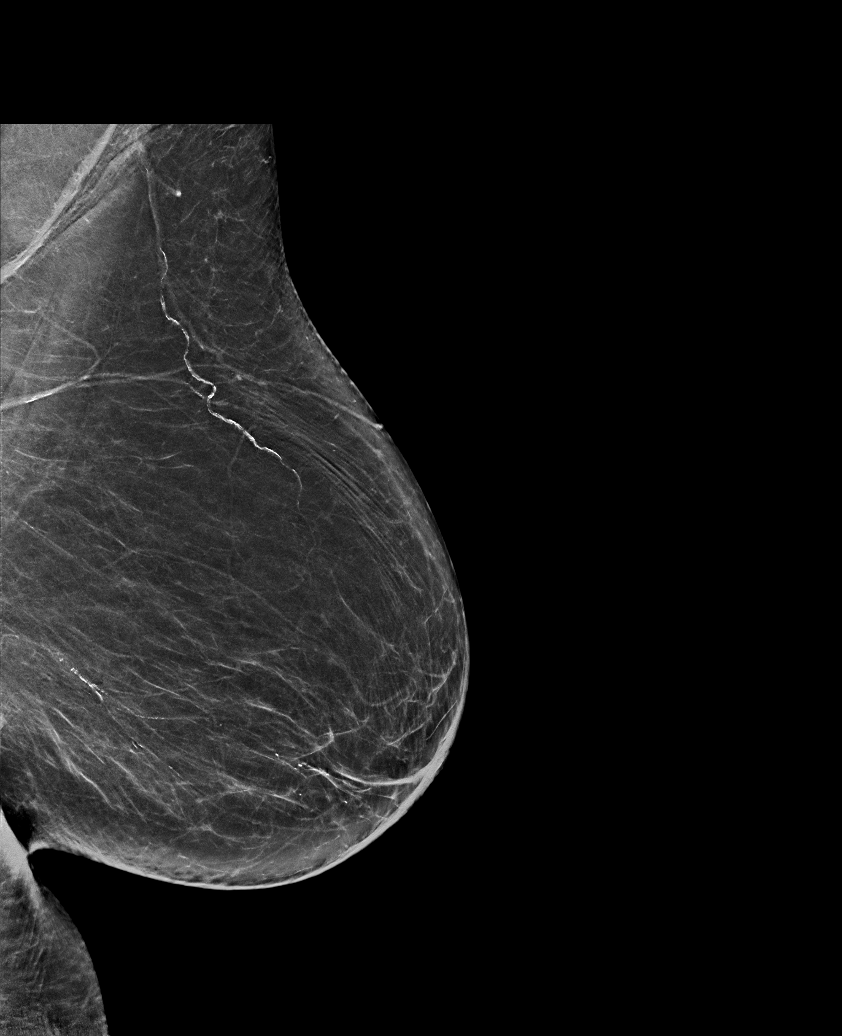

[R CC synth-2D]
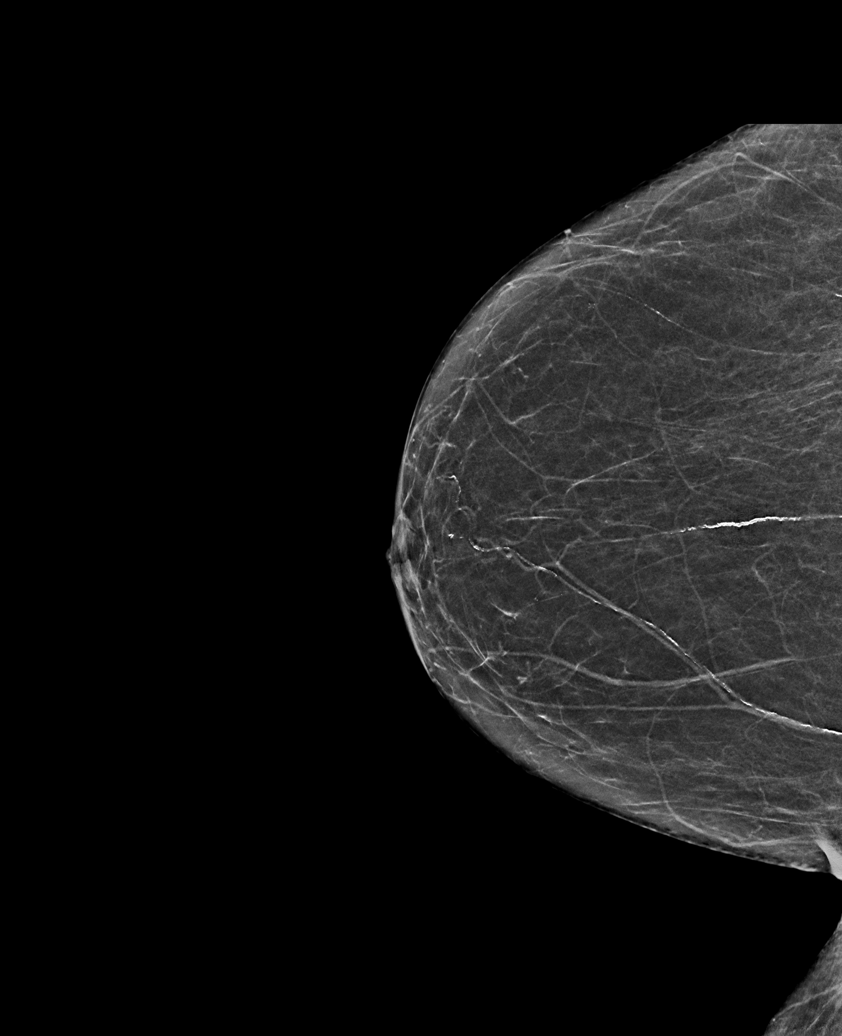

[L CC synth-2D]
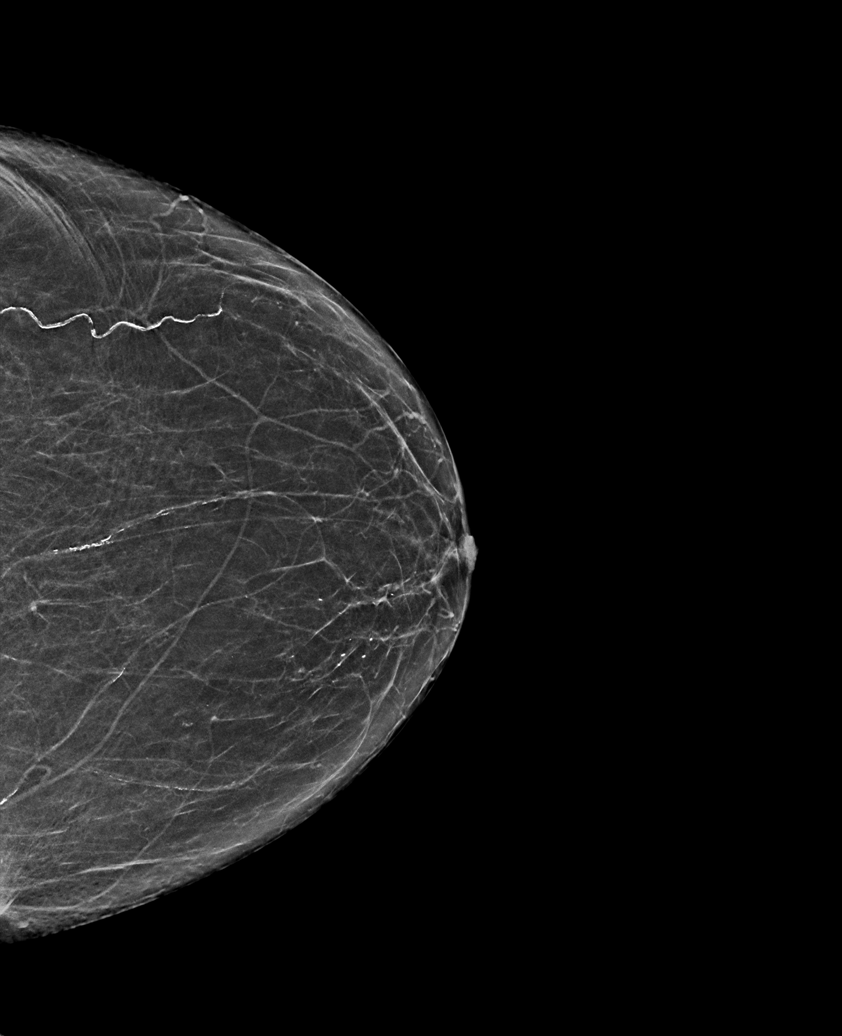

[R MLO tomo · tomo slice 31/60.0]
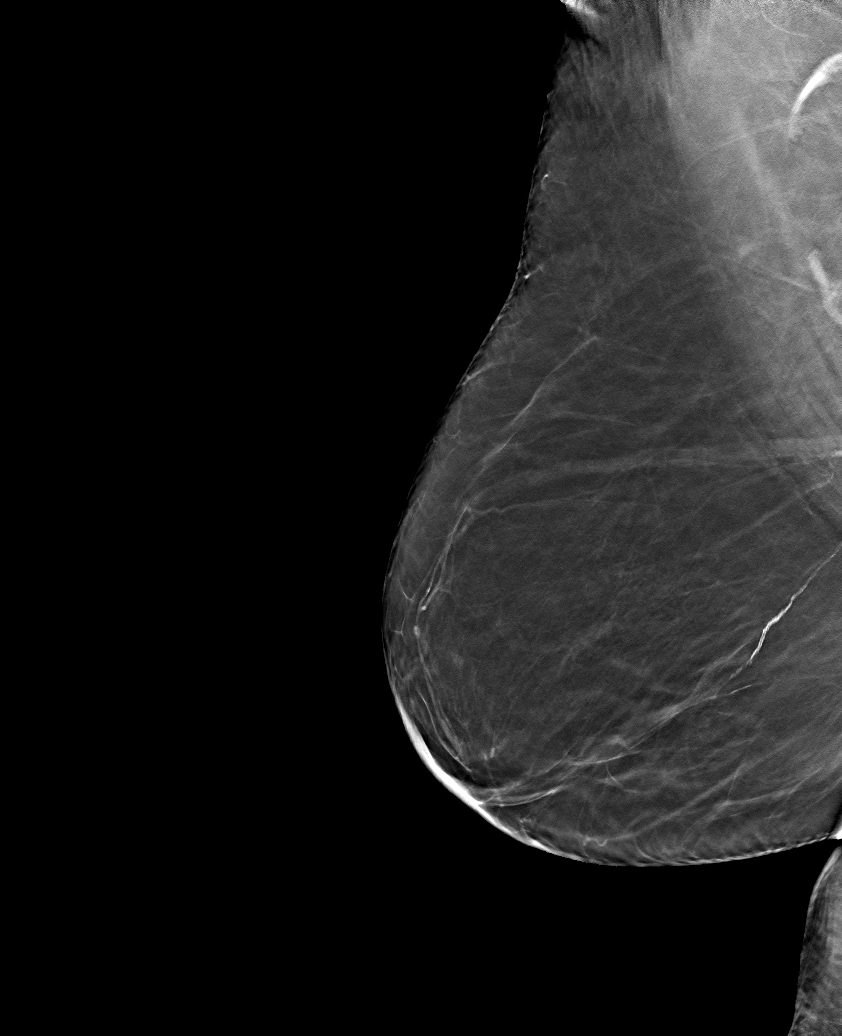

[6 of 30 positions shown; findings below may reference images not displayed]

ACR Breast Density Category b: There are scattered areas of
fibroglandular density.
FINDINGS: There are no findings suspicious for malignancy. Images were
processed with CAD.
IMPRESSION: No mammographic evidence of malignancy. A result letter of this
screening mammogram will be mailed directly to the patient.

RECOMMENDATION:
Screening mammogram in one year. (Code:CN-U-775)

BI-RADS CATEGORY  1: Negative.

## 2018-08-24 DIAGNOSIS — E1122 Type 2 diabetes mellitus with diabetic chronic kidney disease: Secondary | ICD-10-CM | POA: Insufficient documentation

## 2019-01-16 ENCOUNTER — Other Ambulatory Visit: Payer: Self-pay | Admitting: Internal Medicine

## 2019-01-16 DIAGNOSIS — Z1231 Encounter for screening mammogram for malignant neoplasm of breast: Secondary | ICD-10-CM

## 2019-02-20 ENCOUNTER — Ambulatory Visit
Admission: RE | Admit: 2019-02-20 | Discharge: 2019-02-20 | Disposition: A | Discharge status: Home / Self Care | Payer: Medicare HMO | Source: Ambulatory Visit | Attending: Internal Medicine | Admitting: Internal Medicine

## 2019-02-20 DIAGNOSIS — Z1231 Encounter for screening mammogram for malignant neoplasm of breast: Secondary | ICD-10-CM | POA: Diagnosis present

## 2019-02-20 IMAGING — MG DIGITAL SCREENING BILAT W/ TOMO
6 of 10 series · 6 of 30 positions shown · non-contrast
Comparison: Previous exam(s).

ACR Breast Density Category a: The breast tissue is almost entirely
fatty.

CLINICAL DATA: Screening.

EXAM:
DIGITAL SCREENING BILATERAL MAMMOGRAM WITH TOMO AND CAD

[L CC synth-2D]
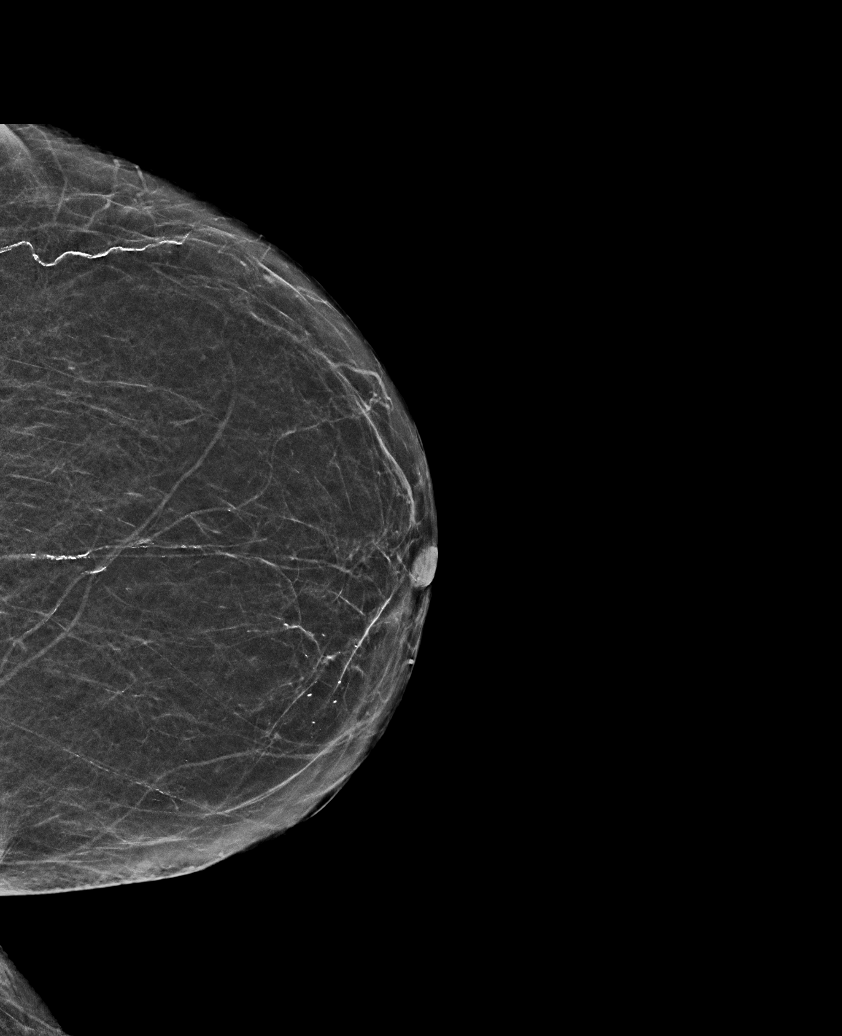

[R CC synth-2D]
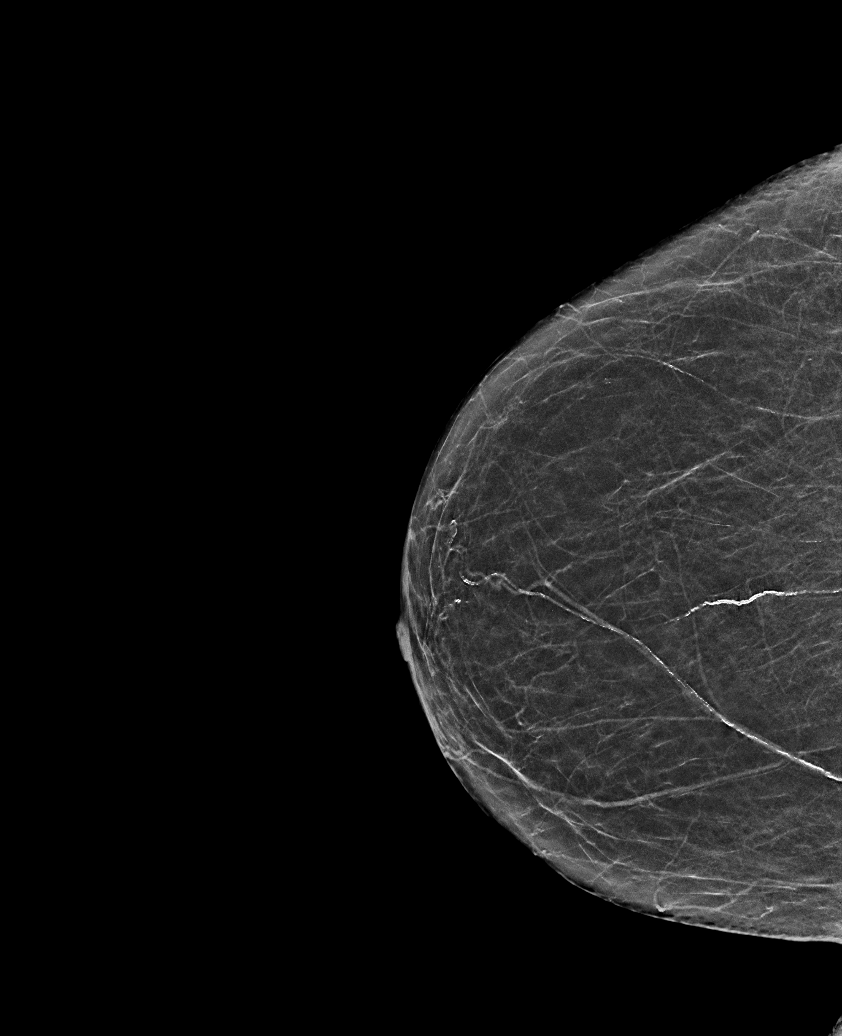

[L MLO synth-2D]
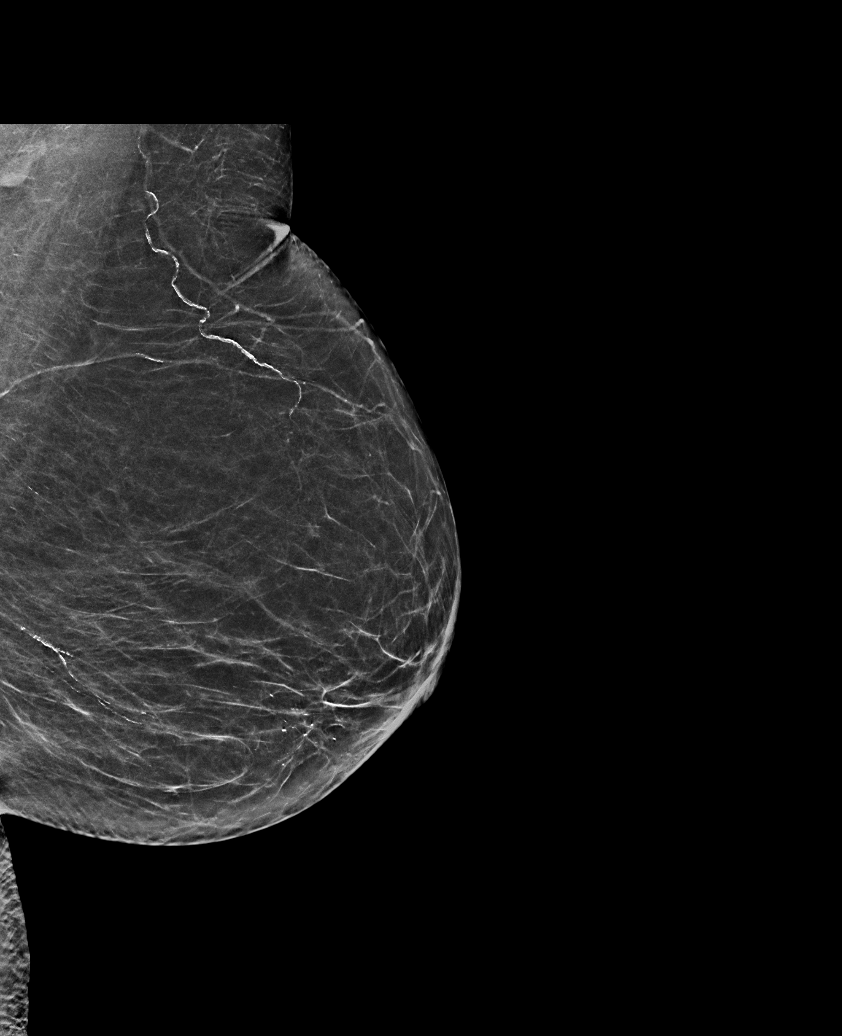

[R MLO synth-2D (1 of 2)]
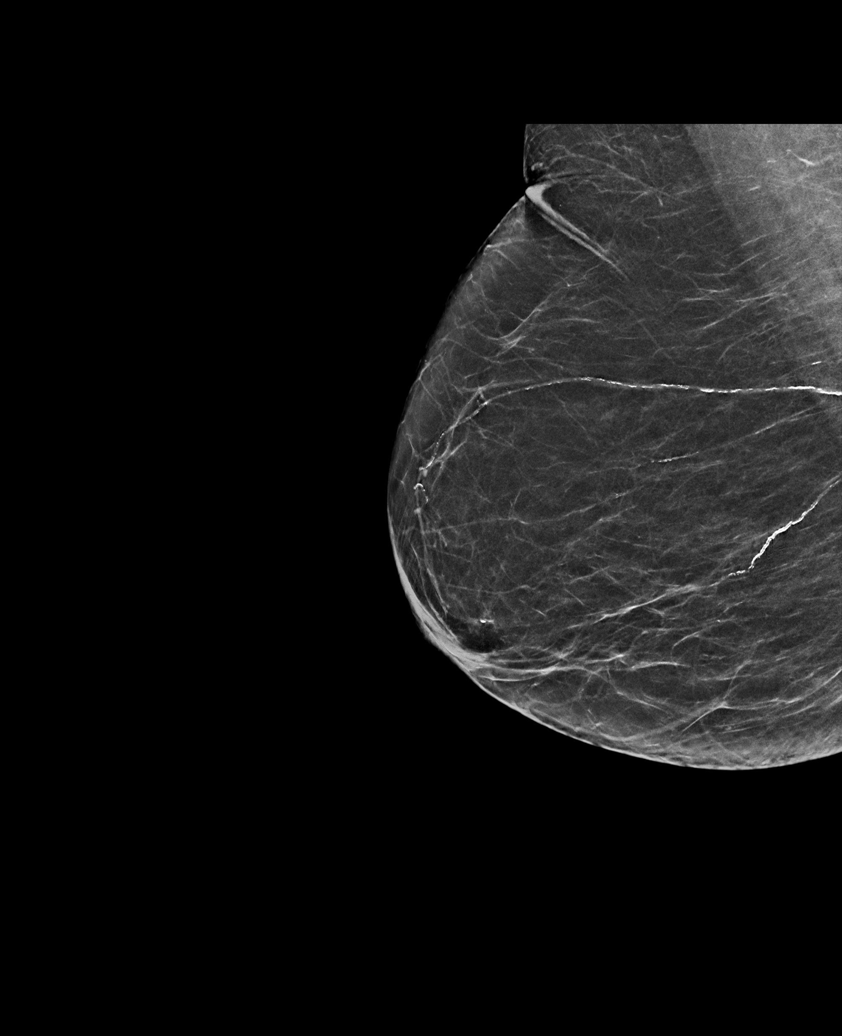

[R MLO synth-2D (2 of 2)]
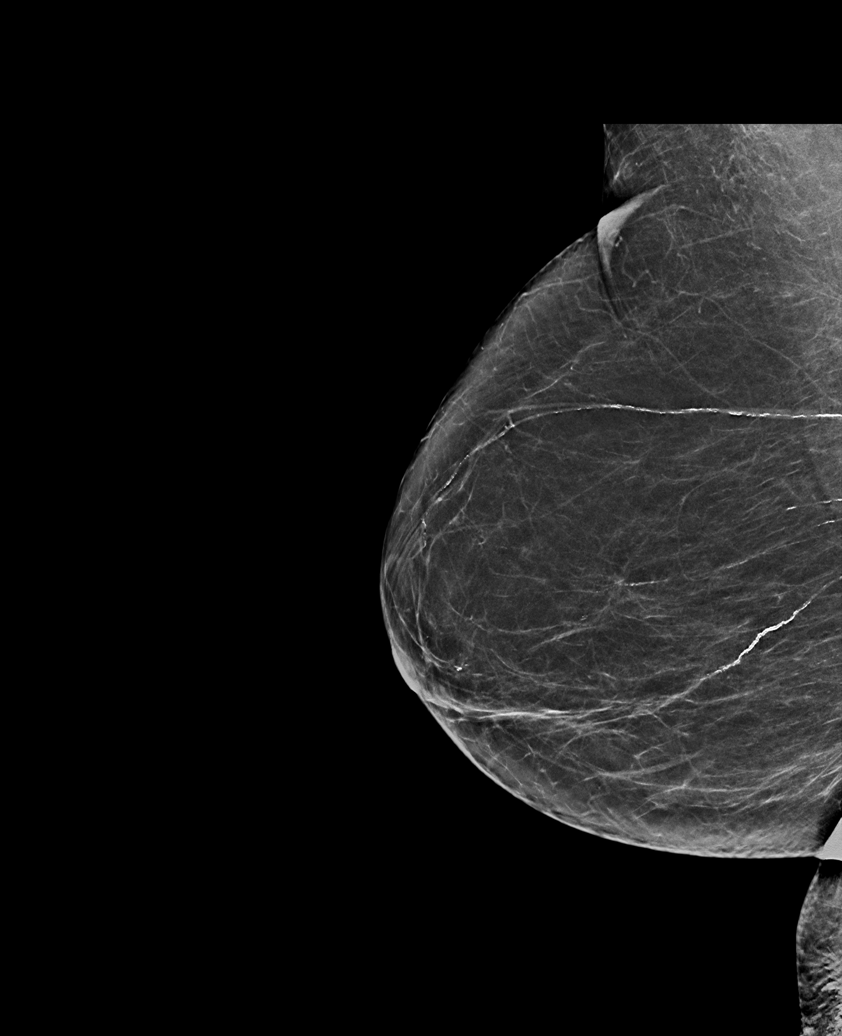

[R CC tomo · tomo slice 25/50.0]
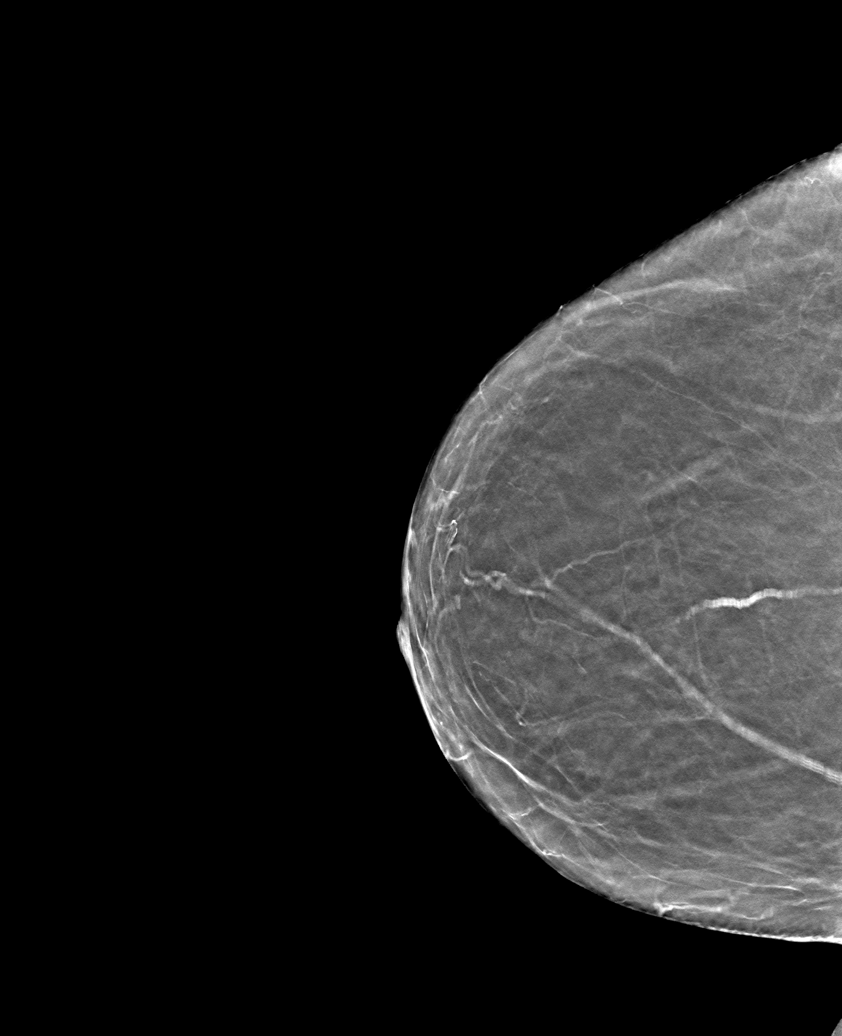

[6 of 30 positions shown; findings below may reference images not displayed]

FINDINGS: There are no findings suspicious for malignancy. Images were
processed with CAD.
IMPRESSION: No mammographic evidence of malignancy. A result letter of this
screening mammogram will be mailed directly to the patient.

RECOMMENDATION:
Screening mammogram in one year. (Code:8Y-[id number])

BI-RADS CATEGORY  1: Negative.

## 2020-01-15 ENCOUNTER — Other Ambulatory Visit: Payer: Self-pay | Admitting: Internal Medicine

## 2020-01-15 DIAGNOSIS — Z1231 Encounter for screening mammogram for malignant neoplasm of breast: Secondary | ICD-10-CM

## 2020-02-21 ENCOUNTER — Ambulatory Visit
Admission: RE | Admit: 2020-02-21 | Discharge: 2020-02-21 | Disposition: A | Discharge status: Home / Self Care | Payer: Medicare HMO | Source: Ambulatory Visit | Attending: Internal Medicine | Admitting: Internal Medicine

## 2020-02-21 ENCOUNTER — Other Ambulatory Visit: Payer: Self-pay

## 2020-02-21 DIAGNOSIS — Z1231 Encounter for screening mammogram for malignant neoplasm of breast: Secondary | ICD-10-CM | POA: Diagnosis not present

## 2020-02-21 IMAGING — MG DIGITAL SCREENING BILAT W/ TOMO W/ CAD
6 of 10 series · 6 of 30 positions shown · non-contrast
Comparison: Previous exam(s).

ACR Breast Density Category a: The breast tissue is almost entirely
fatty.

CLINICAL DATA: Screening.

EXAM:
DIGITAL SCREENING BILATERAL MAMMOGRAM WITH TOMO AND CAD

[L MLO synth-2D (1 of 2)]
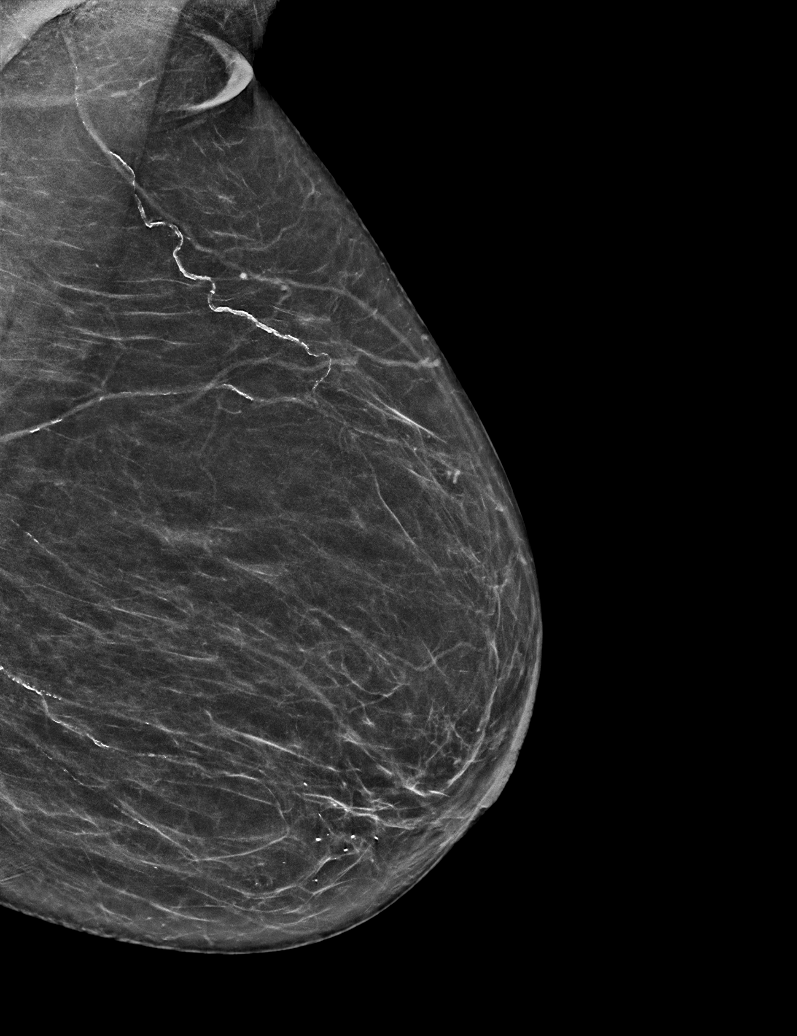

[R MLO synth-2D]
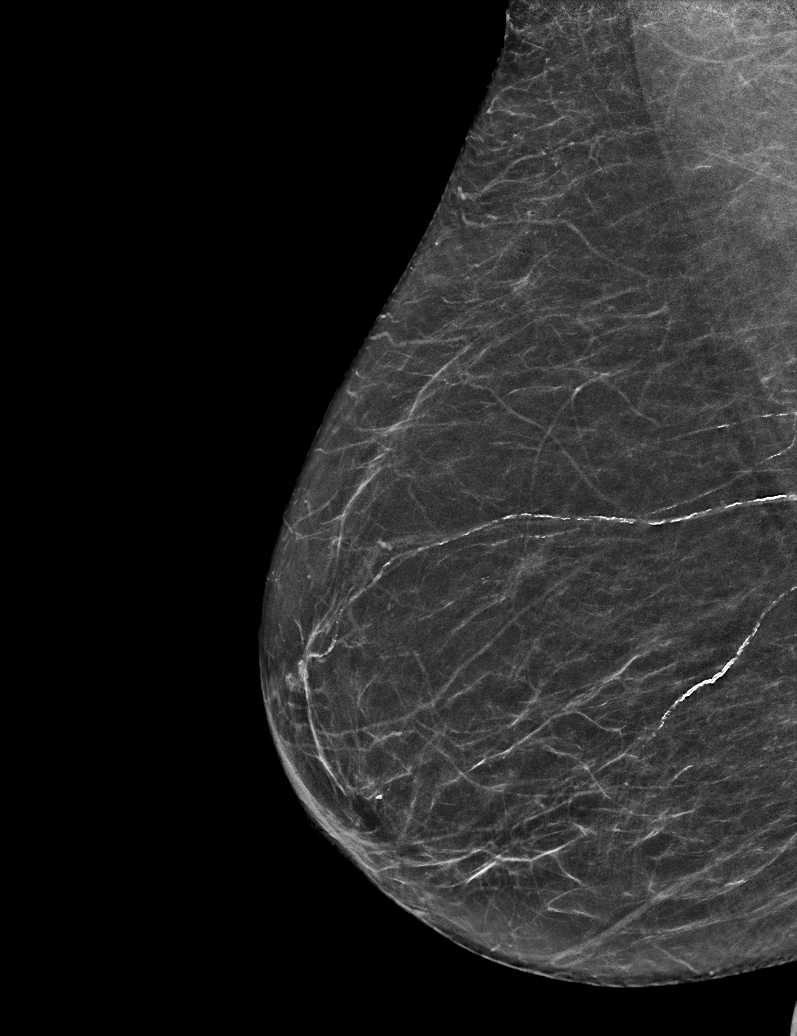

[R CC synth-2D]
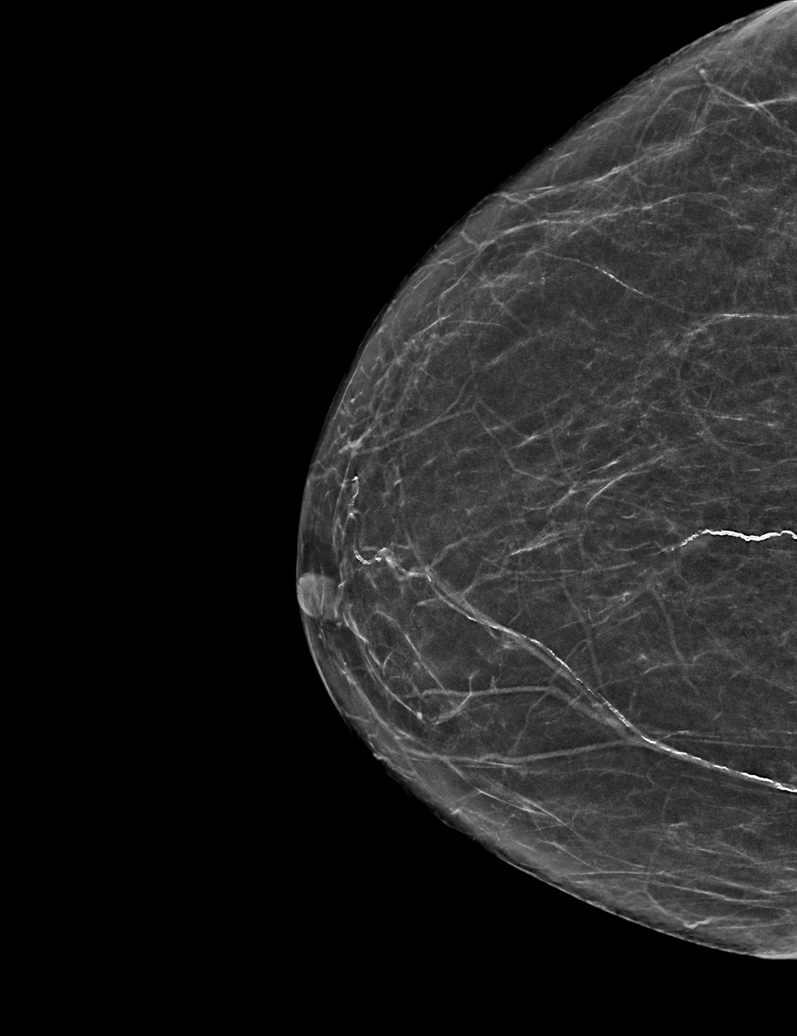

[L CC synth-2D]
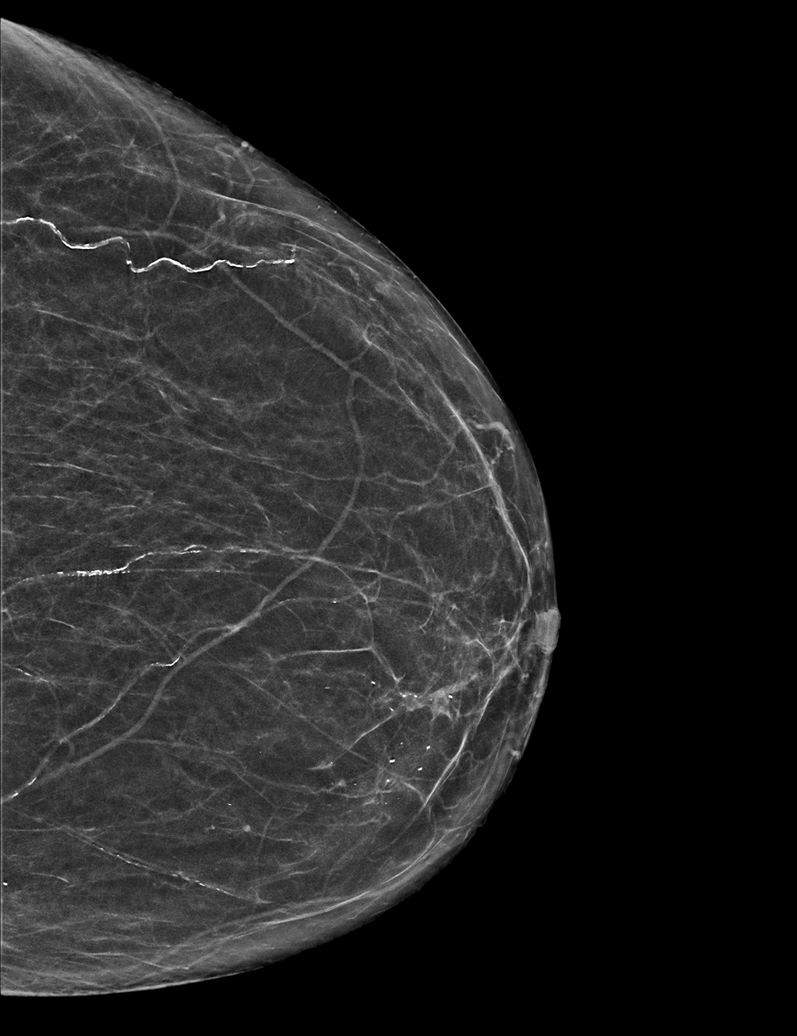

[L MLO synth-2D (2 of 2)]
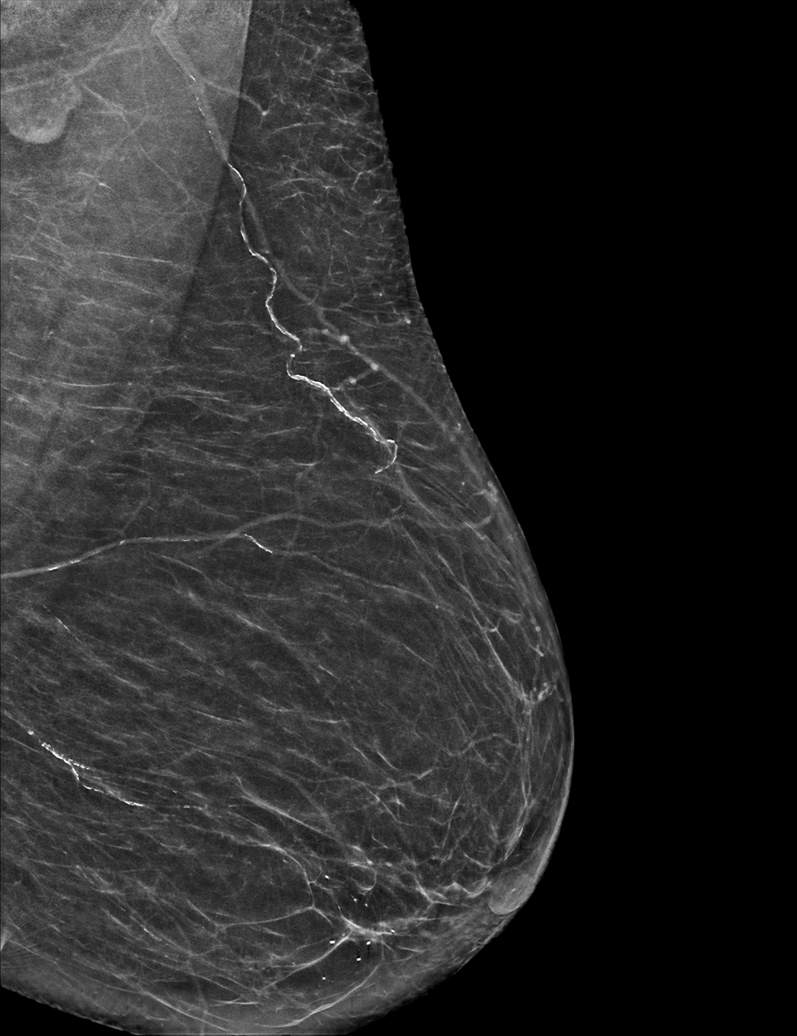

[L MLO tomo · tomo slice 28/55.0]
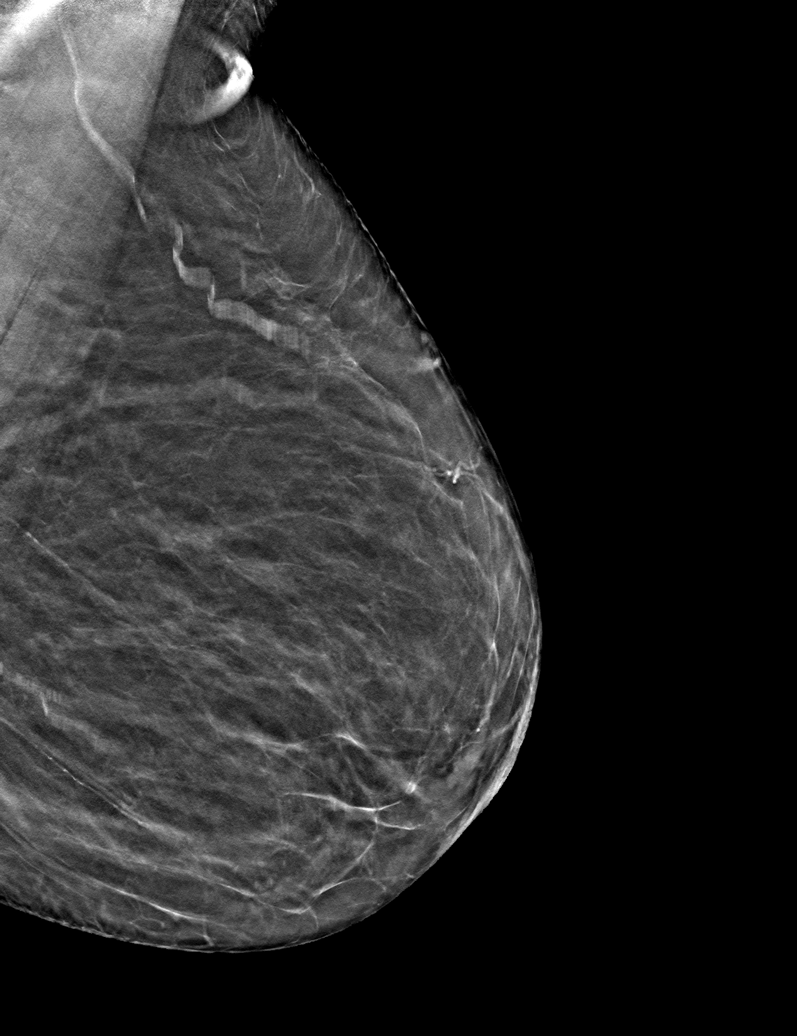

[6 of 30 positions shown; findings below may reference images not displayed]

FINDINGS: There are no findings suspicious for malignancy. Images were
processed with CAD.
IMPRESSION: No mammographic evidence of malignancy. A result letter of this
screening mammogram will be mailed directly to the patient.

RECOMMENDATION:
Screening mammogram in one year. (Code:8Y-Q-VVS)

BI-RADS CATEGORY  1: Negative.

## 2020-10-19 ENCOUNTER — Inpatient Hospital Stay
Admission: EM | Admit: 2020-10-19 | Discharge: 2020-10-24 | DRG: 065 | Disposition: A | Discharge status: Rehabilitation Facility | Payer: Medicare HMO | Attending: Internal Medicine | Admitting: Internal Medicine

## 2020-10-19 ENCOUNTER — Emergency Department: Payer: Medicare HMO

## 2020-10-19 ENCOUNTER — Other Ambulatory Visit: Payer: Self-pay

## 2020-10-19 ENCOUNTER — Encounter: Payer: Self-pay | Admitting: Internal Medicine

## 2020-10-19 DIAGNOSIS — E785 Hyperlipidemia, unspecified: Secondary | ICD-10-CM | POA: Diagnosis present

## 2020-10-19 DIAGNOSIS — E039 Hypothyroidism, unspecified: Secondary | ICD-10-CM | POA: Diagnosis present

## 2020-10-19 DIAGNOSIS — Z89422 Acquired absence of other left toe(s): Secondary | ICD-10-CM

## 2020-10-19 DIAGNOSIS — Z7984 Long term (current) use of oral hypoglycemic drugs: Secondary | ICD-10-CM

## 2020-10-19 DIAGNOSIS — E1165 Type 2 diabetes mellitus with hyperglycemia: Secondary | ICD-10-CM | POA: Diagnosis present

## 2020-10-19 DIAGNOSIS — E781 Pure hyperglyceridemia: Secondary | ICD-10-CM | POA: Diagnosis present

## 2020-10-19 DIAGNOSIS — I6529 Occlusion and stenosis of unspecified carotid artery: Secondary | ICD-10-CM

## 2020-10-19 DIAGNOSIS — R531 Weakness: Secondary | ICD-10-CM | POA: Diagnosis not present

## 2020-10-19 DIAGNOSIS — E1122 Type 2 diabetes mellitus with diabetic chronic kidney disease: Secondary | ICD-10-CM | POA: Diagnosis present

## 2020-10-19 DIAGNOSIS — I10 Essential (primary) hypertension: Secondary | ICD-10-CM | POA: Diagnosis not present

## 2020-10-19 DIAGNOSIS — G8191 Hemiplegia, unspecified affecting right dominant side: Secondary | ICD-10-CM | POA: Diagnosis present

## 2020-10-19 DIAGNOSIS — Z87891 Personal history of nicotine dependence: Secondary | ICD-10-CM

## 2020-10-19 DIAGNOSIS — Z79899 Other long term (current) drug therapy: Secondary | ICD-10-CM

## 2020-10-19 DIAGNOSIS — R4701 Aphasia: Secondary | ICD-10-CM | POA: Diagnosis present

## 2020-10-19 DIAGNOSIS — G459 Transient cerebral ischemic attack, unspecified: Secondary | ICD-10-CM | POA: Diagnosis present

## 2020-10-19 DIAGNOSIS — D72828 Other elevated white blood cell count: Secondary | ICD-10-CM | POA: Diagnosis present

## 2020-10-19 DIAGNOSIS — I63232 Cerebral infarction due to unspecified occlusion or stenosis of left carotid arteries: Principal | ICD-10-CM | POA: Diagnosis present

## 2020-10-19 DIAGNOSIS — E119 Type 2 diabetes mellitus without complications: Secondary | ICD-10-CM

## 2020-10-19 DIAGNOSIS — N1832 Chronic kidney disease, stage 3b: Secondary | ICD-10-CM | POA: Diagnosis present

## 2020-10-19 DIAGNOSIS — Z20822 Contact with and (suspected) exposure to covid-19: Secondary | ICD-10-CM | POA: Diagnosis present

## 2020-10-19 DIAGNOSIS — M109 Gout, unspecified: Secondary | ICD-10-CM | POA: Diagnosis present

## 2020-10-19 DIAGNOSIS — R29810 Facial weakness: Secondary | ICD-10-CM | POA: Diagnosis present

## 2020-10-19 DIAGNOSIS — F40240 Claustrophobia: Secondary | ICD-10-CM | POA: Diagnosis present

## 2020-10-19 DIAGNOSIS — R4781 Slurred speech: Secondary | ICD-10-CM | POA: Diagnosis present

## 2020-10-19 DIAGNOSIS — Z01810 Encounter for preprocedural cardiovascular examination: Secondary | ICD-10-CM

## 2020-10-19 DIAGNOSIS — I491 Atrial premature depolarization: Secondary | ICD-10-CM | POA: Diagnosis present

## 2020-10-19 DIAGNOSIS — I639 Cerebral infarction, unspecified: Secondary | ICD-10-CM

## 2020-10-19 DIAGNOSIS — I129 Hypertensive chronic kidney disease with stage 1 through stage 4 chronic kidney disease, or unspecified chronic kidney disease: Secondary | ICD-10-CM | POA: Diagnosis present

## 2020-10-19 DIAGNOSIS — Z79890 Hormone replacement therapy (postmenopausal): Secondary | ICD-10-CM

## 2020-10-19 LAB — DIFFERENTIAL
Abs Immature Granulocytes: 0.02 10*3/uL (ref 0.00–0.07)
Basophils Absolute: 0.1 10*3/uL (ref 0.0–0.1)
Basophils Relative: 1 %
Eosinophils Absolute: 0.9 10*3/uL — ABNORMAL HIGH (ref 0.0–0.5)
Eosinophils Relative: 11 %
Immature Granulocytes: 0 %
Lymphocytes Relative: 30 %
Lymphs Abs: 2.5 10*3/uL (ref 0.7–4.0)
Monocytes Absolute: 0.8 10*3/uL (ref 0.1–1.0)
Monocytes Relative: 9 %
Neutro Abs: 3.9 10*3/uL (ref 1.7–7.7)
Neutrophils Relative %: 49 %

## 2020-10-19 LAB — APTT: aPTT: 34 seconds (ref 24–36)

## 2020-10-19 LAB — URINALYSIS, COMPLETE (UACMP) WITH MICROSCOPIC
Bilirubin Urine: NEGATIVE
Glucose, UA: NEGATIVE mg/dL
Hgb urine dipstick: NEGATIVE
Ketones, ur: NEGATIVE mg/dL
Nitrite: NEGATIVE
Protein, ur: NEGATIVE mg/dL
Specific Gravity, Urine: 1.009 (ref 1.005–1.030)
pH: 5 (ref 5.0–8.0)

## 2020-10-19 LAB — COMPREHENSIVE METABOLIC PANEL
ALT: 12 U/L (ref 0–44)
AST: 18 U/L (ref 15–41)
Albumin: 4.1 g/dL (ref 3.5–5.0)
Alkaline Phosphatase: 103 U/L (ref 38–126)
Anion gap: 8 (ref 5–15)
BUN: 28 mg/dL — ABNORMAL HIGH (ref 8–23)
CO2: 20 mmol/L — ABNORMAL LOW (ref 22–32)
Calcium: 8.9 mg/dL (ref 8.9–10.3)
Chloride: 107 mmol/L (ref 98–111)
Creatinine, Ser: 1.4 mg/dL — ABNORMAL HIGH (ref 0.44–1.00)
GFR, Estimated: 36 mL/min — ABNORMAL LOW (ref >60)
Glucose, Bld: 245 mg/dL — ABNORMAL HIGH (ref 70–99)
Potassium: 4.3 mmol/L (ref 3.5–5.1)
Sodium: 135 mmol/L (ref 135–145)
Total Bilirubin: 0.7 mg/dL (ref 0.3–1.2)
Total Protein: 7.3 g/dL (ref 6.5–8.1)

## 2020-10-19 LAB — CBC
HCT: 35.7 % — ABNORMAL LOW (ref 36.0–46.0)
Hemoglobin: 11.9 g/dL — ABNORMAL LOW (ref 12.0–15.0)
MCH: 31.6 pg (ref 26.0–34.0)
MCHC: 33.3 g/dL (ref 30.0–36.0)
MCV: 94.9 fL (ref 80.0–100.0)
Platelets: 242 10*3/uL (ref 150–400)
RBC: 3.76 MIL/uL — ABNORMAL LOW (ref 3.87–5.11)
RDW: 12.3 % (ref 11.5–15.5)
WBC: 8.1 10*3/uL (ref 4.0–10.5)
nRBC: 0 % (ref 0.0–0.2)

## 2020-10-19 LAB — LIPID PANEL
Cholesterol: 128 mg/dL (ref 0–200)
HDL: 43 mg/dL (ref >40)
LDL Cholesterol: 43 mg/dL (ref 0–99)
Total CHOL/HDL Ratio: 3 RATIO
Triglycerides: 211 mg/dL — ABNORMAL HIGH (ref <150)
VLDL: 42 mg/dL — ABNORMAL HIGH (ref 0–40)

## 2020-10-19 LAB — RESP PANEL BY RT-PCR (FLU A&B, COVID) ARPGX2
Influenza A by PCR: NEGATIVE
Influenza B by PCR: NEGATIVE
SARS Coronavirus 2 by RT PCR: NEGATIVE

## 2020-10-19 LAB — MAGNESIUM: Magnesium: 1.9 mg/dL (ref 1.7–2.4)

## 2020-10-19 LAB — CBG MONITORING, ED: Glucose-Capillary: 167 mg/dL — ABNORMAL HIGH (ref 70–99)

## 2020-10-19 LAB — PROTIME-INR
INR: 1.1 (ref 0.8–1.2)
Prothrombin Time: 13.7 seconds (ref 11.4–15.2)

## 2020-10-19 IMAGING — CT CT ANGIO HEAD-NECK (W OR W/O PERF)
2 of 10 series · 9 of 33 positions shown · IV contrast (APPLIED)
Comparison: None.

CLINICAL DATA: Initial evaluation for acute right-sided weakness,
speech difficulty.

EXAM:
CT ANGIOGRAPHY HEAD AND NECK
TECHNIQUE: Multidetector CT imaging of the head and neck was performed using
the standard protocol during bolus administration of intravenous
contrast. Multiplanar CT image reconstructions and MIPs were
obtained to evaluate the vascular anatomy. Carotid stenosis
measurements (when applicable) are obtained utilizing NASCET
criteria, using the distal internal carotid diameter as the
denominator.
CONTRAST:  75mL OMNIPAQUE IOHEXOL 350 MG/ML SOLN

[Series 8: cta head neck · axial · 0.61mm/px · z∈[-315,+37]mm · 3 of 166 slices shown]
[im 1/166  soft-tissue]
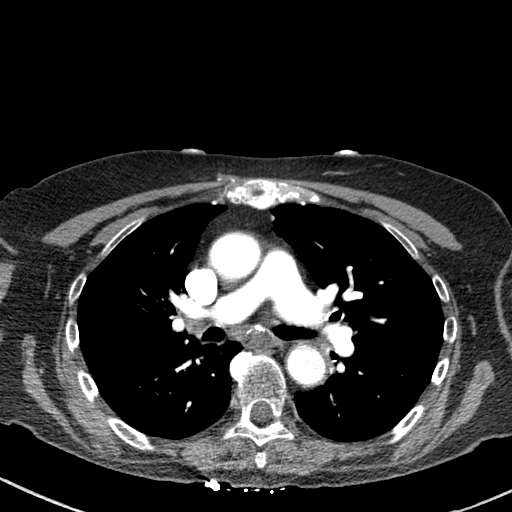
[im 83/166  bone]
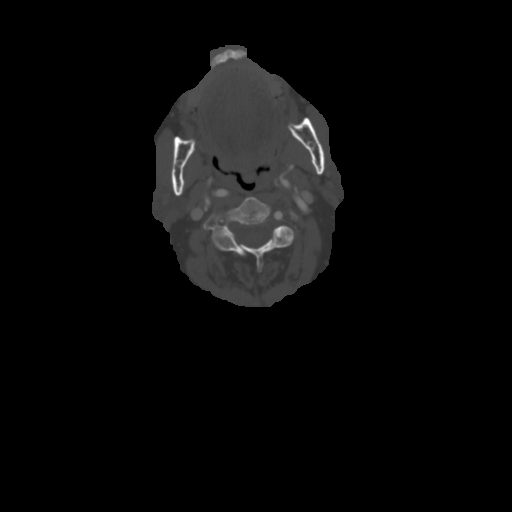
[im 166/166  soft-tissue]
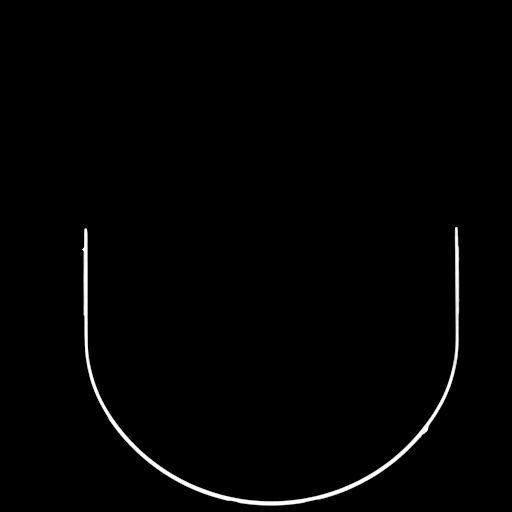

[Series 10: ax thin · axial · 0.57mm/px · z∈[-266,-23]mm · 6 of 331 slices shown]
[im 48/331  soft-tissue]
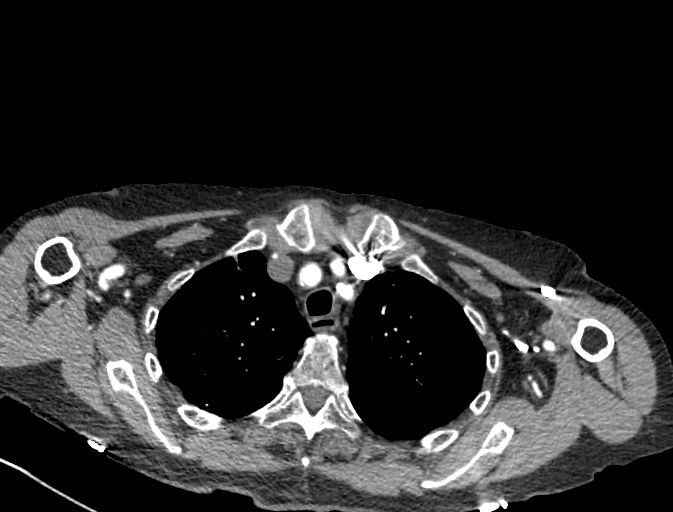
[im 95/331  soft-tissue]
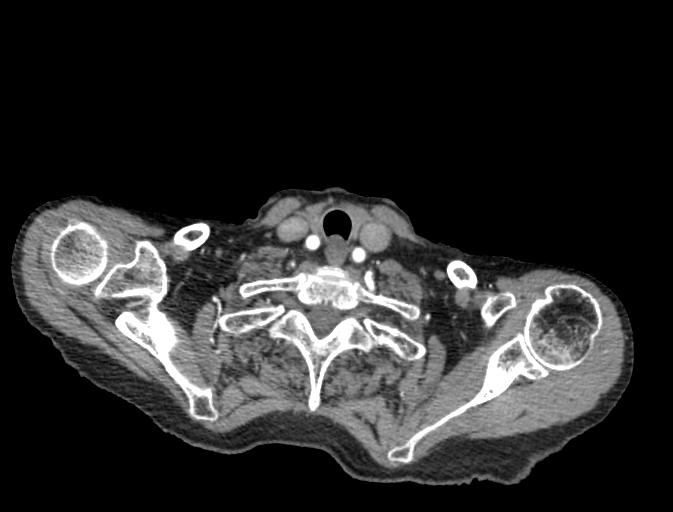
[im 142/331  soft-tissue]
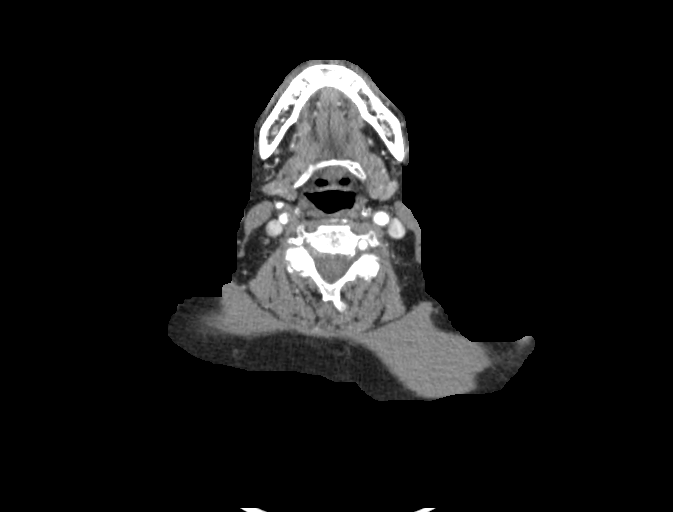
[im 189/331  soft-tissue]
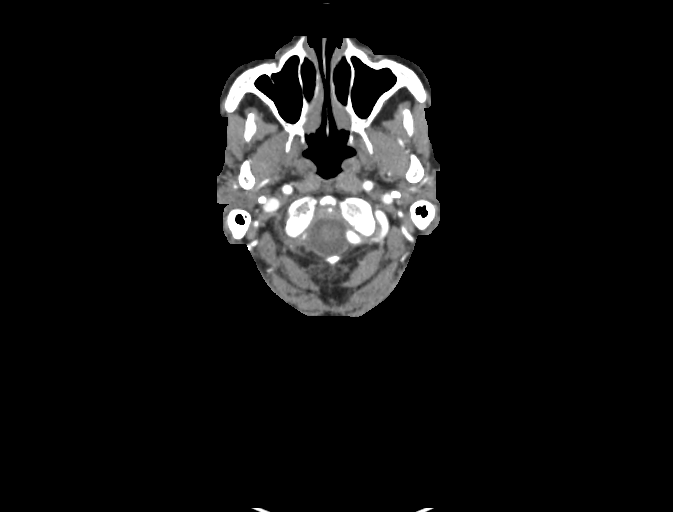
[im 236/331  soft-tissue]
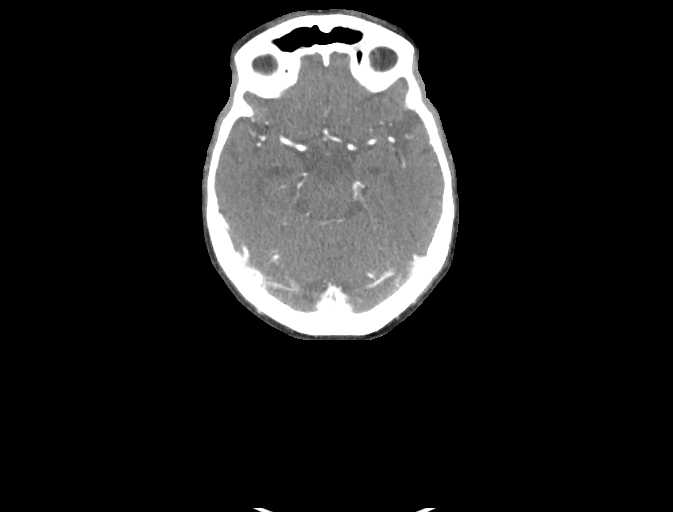
[im 283/331  soft-tissue]
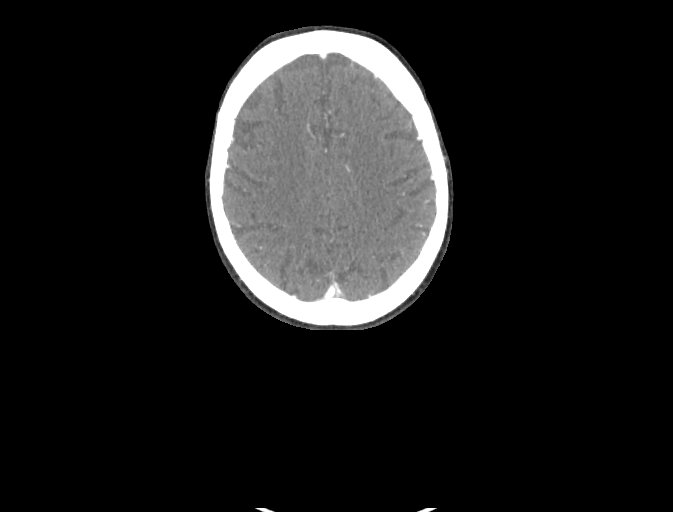

[9 of 33 positions shown; findings below may reference images not displayed]

FINDINGS: CT HEAD FINDINGS

Brain: Generalized age-related cerebral atrophy. Few small remote
lacunar infarcts noted about the right basal ganglia and right
thalamus. No acute intracranial hemorrhage. No acute large vessel
territory infarct. No mass lesion, midline shift or mass effect. No
hydrocephalus or extra-axial fluid collection.

Vascular: No hyperdense vessel. Scattered vascular calcifications
noted within the carotid siphons.

Skull: Scalp soft tissues demonstrate no acute finding. Calvarium
intact.

Sinuses: Changes of chronic sinusitis noted. No active disease. No
mastoid effusion.

Orbits:  Globes orbital soft tissues demonstrate no acute finding.

CTA NECK FINDINGS

Aortic arch: Visualized aortic arch normal caliber with normal 3
vessel morphology. Moderate atheromatous change about the arch and
origin of the great vessels without high-grade stenosis.

Right carotid system: Right CCA patent from its origin to the
bifurcation without stenosis. Eccentric mixed plaque at the right
carotid bulb/proximal right ICA with associated mild 35% stenosis by
NASCET criteria. Right ICA patent distally without stenosis,
dissection or occlusion.

Left carotid system: The CCA patent from its origin to the
bifurcation without stenosis. Bulky calcified plaque about the left
carotid bulb with associated stenosis of up to 70% by NASCET
criteria. Left ICA patent distally without stenosis, dissection or
occlusion.

Vertebral arteries: Both vertebral arteries arise from subclavian
arteries. No significant proximal subclavian artery stenosis.
Strongly dominant left vertebral artery with a diffusely hypoplastic
right vertebral artery. Atheromatous plaque at the origin of the
dominant left vertebral artery with associated severe stenosis
(series 10, image 77). Dominant left vertebral artery otherwise
patent within the neck. Focal severe distal right V3 stenosis noted
just prior to cranial vault (series 10, image 196).

Skeleton: No visible acute osseous finding. No discrete or worrisome
osseous lesions.

Other neck: No other acute soft tissue abnormality within the neck.
No mass or adenopathy.

Upper chest: Scattered interlobular septal thickening noted within
the visualized lungs, suggesting mild pulmonary interstitial edema.
Mildly prominent precarinal node measures up to the upper limits of
normal at 1 cm. Visualized upper chest demonstrates no other acute
finding.

Review of the MIP images confirms the above findings

CTA HEAD FINDINGS

Anterior circulation: Petrous segments patent bilaterally. Scattered
atheromatous change within the carotid siphons without high-grade
stenosis. A1 segments patent bilaterally. Normal anterior
communicating artery complex. Anterior cerebral arteries patent to
their distal aspects without stenosis. No M1 stenosis or occlusion.
Normal MCA bifurcations. Distal MCA branches well perfused and
symmetric.

Posterior circulation: Dominant left vertebral artery widely patent
to the vertebrobasilar junction. Right vertebral artery markedly
hypoplastic and irregular, but is grossly patent to the
vertebrobasilar junction as well. Right PICA origin patent. Left
PICA not seen. Basilar patent to its distal aspect without stenosis.
Superior cerebral arteries patent bilaterally. Left PCA supplied via
the basilar and is widely patent to its distal aspect. Fetal type
origin of the right PCA. Atheromatous change throughout the right
PCA with associated moderate multifocal P2/P3 stenoses.

Venous sinuses: Grossly patent allowing for timing the contrast
bolus.

Anatomic variants: Fetal type origin of the right PCA.  No aneurysm.

Review of the MIP images confirms the above findings
IMPRESSION: CT HEAD IMPRESSION:

1. No acute intracranial abnormality.
2. Few small remote lacunar infarcts about the right basal ganglia
and thalamus.

CTA HEAD AND NECK IMPRESSION:

1. Negative CTA for emergent large vessel occlusion.
2. Bulky calcified plaque about the left carotid bulb with
associated stenosis of up to 70% by NASCET criteria.
3. Atheromatous plaque at the origin of the dominant left vertebral
artery with associated severe stenosis. Strongly dominant left
vertebral artery otherwise patent within the neck.
4. Atheromatous change throughout the right PCA with associated
moderate multifocal P2/P3 stenoses.
5. Mild pulmonary interstitial edema.

## 2020-10-19 MED ORDER — INSULIN ASPART 100 UNIT/ML IJ SOLN
0.0000 [IU] | Freq: Three times a day (TID) | INTRAMUSCULAR | Status: DC
  Administered 2020-10-20: 1 [IU] via SUBCUTANEOUS
  Filled 2020-10-19: qty 1

## 2020-10-19 MED ORDER — IOHEXOL 350 MG/ML SOLN
75.0000 mL | Freq: Once | INTRAVENOUS | Status: AC | PRN
  Administered 2020-10-19: 75 mL via INTRAVENOUS

## 2020-10-19 MED ORDER — ACETAMINOPHEN 650 MG RE SUPP
650.0000 mg | Freq: Four times a day (QID) | RECTAL | Status: DC | PRN
Start: 2020-10-19 — End: 2020-10-24

## 2020-10-19 MED ORDER — DIAZEPAM 2 MG PO TABS
2.0000 mg | ORAL_TABLET | Freq: Once | ORAL | Status: AC | PRN
  Administered 2020-10-20: 2 mg via ORAL
  Filled 2020-10-19: qty 1

## 2020-10-19 MED ORDER — INSULIN ASPART 100 UNIT/ML IJ SOLN: 0.0000 [IU] | INTRAMUSCULAR | Status: DC

## 2020-10-19 MED ORDER — SIMVASTATIN 20 MG PO TABS
20.0000 mg | ORAL_TABLET | Freq: Every day | ORAL | Status: DC
  Administered 2020-10-20: 08:00:00 20 mg via ORAL
  Filled 2020-10-19: qty 1

## 2020-10-19 MED ORDER — INSULIN ASPART 100 UNIT/ML IJ SOLN
0.0000 [IU] | Freq: Every day | INTRAMUSCULAR | Status: DC
  Administered 2020-10-21: 21:00:00 3 [IU] via SUBCUTANEOUS
  Filled 2020-10-19: qty 1

## 2020-10-19 MED ORDER — ACETAMINOPHEN 325 MG PO TABS
650.0000 mg | ORAL_TABLET | Freq: Four times a day (QID) | ORAL | Status: DC | PRN
Start: 2020-10-19 — End: 2020-10-24

## 2020-10-19 MED ORDER — LEVOTHYROXINE SODIUM 88 MCG PO TABS
88.0000 ug | ORAL_TABLET | Freq: Every day | ORAL | Status: DC
  Administered 2020-10-20 – 2020-10-24 (×5): 88 ug via ORAL
  Filled 2020-10-19 (×5): qty 1

## 2020-10-19 MED ORDER — HYDRALAZINE HCL 20 MG/ML IJ SOLN: 10.0000 mg | INTRAMUSCULAR | Status: DC | PRN

## 2020-10-19 MED ORDER — STROKE: EARLY STAGES OF RECOVERY BOOK: Freq: Once | Status: AC

## 2020-10-19 MED ORDER — HYDROCODONE-ACETAMINOPHEN 5-325 MG PO TABS: 1.0000 | ORAL_TABLET | Freq: Four times a day (QID) | ORAL | Status: DC | PRN

## 2020-10-19 NOTE — ED Triage Notes (Signed)
Pt with right sided weakness to arm, drift noted and word finding difficulty noted. Pt states symptoms began at 1230 today after getting home from church. Family gave 324mg  asa pta. Pt states right arm feels numb. Pt denies headache.

## 2020-10-19 NOTE — ED Notes (Signed)
Pt to CT

## 2020-10-19 NOTE — H&P (Signed)
Weakness History and Physical    PLEASE NOTE THAT DRAGON DICTATION SOFTWARE WAS USED IN THE CONSTRUCTION OF THIS NOTE.   Cassandra Schaefer KAJ:[id number] DOB: [birth year] DOA: 10/19/2020  PCP: Barbette Reichmann, MD Patient coming from: home   I have personally briefly reviewed patient's old medical records in [facility]  Chief Complaint: Right-sided weakness  HPI: Cassandra Schaefer is a 87 y.o. female with medical history significant for type 2 diabetes mellitus, essential hypertension, acquired hypothyroidism, hyperlipidemia, stage III chronic kidney disease with baseline creatinine 1.4-1.7, who is admitted to [facility] on 10/19/2020 with suspected TIA versus acute ischemic CVA after presenting from home to [facility] ED complaining of right-sided weakness.   The patient reports sudden onset weakness in her right upper and lower extremities at approximately 12:30 PM on the afternoon of 10/19/2020.  She reports that this weakness has improved since onset, although she still detects subtle weakness in the right upper and lower extremities relative to her baseline level of strength in these extremities.  Otherwise, she denies any associated or ensuing acute focal paresthesias, numbness, dysphagia, dizziness, vertigo, nausea, vomiting, change in vision, blurry vision, diplopia, word finding difficulties, or headache.  Denies any associated facial droop or slurring of speech.  She also denies any recent chest pain, shortness of breath, palpitations, diaphoresis, dizziness, presyncope, or syncope.  Shortly after onset of the above symptoms, the patient reports that she took aspirin 324 mg p.o. x1 while still at home.  Denies any previous history of stroke. In terms of modifiable risk factors, the patient knowledges a history of hypertension for which she reports good compliance with her home ACE inhibitor, hyperlipidemia on simvastatin as an outpatient, type 2 diabetes mellitus on  metformin in the absence of any exogenous insulin at home.  She acknowledges that she is a former smoker, having completely quit smoking in in the mid 1980s, without any subsequent resumption.  Denies any known history of underlying atrial fibrillation or obstructive sleep apnea.  Not on any blood thinners as an outpatient, including no routine aspirin.  Denies any recent subjective fever, chills, rigors, or generalized myalgias. Denies any recent headache, neck stiffness, rhinitis, rhinorrhea, sore throat, wheezing, cough, nausea, vomiting, abdominal pain, diarrhea, or rash. No recent traveling or known COVID-19 exposures. Denies dysuria, gross hematuria, or change in urinary urgency/frequency.  No recent trauma.  Patient noted to have arrived in [facility] ED at 2148 .     ED Course:  Vital signs in the ED were notable for the following oxygen saturation 97 to 98% on room air, temperature max 98.4, heart rate 70-90; blood pressure 171/96 -196/67; respiratory rate 16-18;   Labs were notable for the following: CMP notable for the following: Sodium 135, bicarbonate 20, anion gap 8, creatinine 1.40 relative to most recent prior creatinine of 1.7 on 08/27/2020, glucose 245, and liver enzymes were found to be within normal limits.  Urinalysis showed no white blood cells and was nitrate negative.  CBC notable for white blood cell count 8100.  INR 1.1.  Screening nasopharyngeal COVID-19/influenza PCR was performed in the ED today and found to be negative.  EKG shows normal sinus rhythm with heart rate 81, normal intervals, and no evidence of T wave or ST changes, including no evidence of ST elevation.  Noncontrast CT of the head shows no evidence of acute intracranial process, including no evidence of intracranial hemorrhage or acute ischemic infarct.  CT of the head and neck shows no evidence  of large vessel occlusion.  Cassandra Schaefer, the patient was admitted for overnight observation to the med telemetry floor for  further evaluation management of suspected TIA versus acute ischemic stroke, including further evaluation for potential modifiable acute ischemic CVA risk factors as well as pursuit of MRI brain in addition to initiation of supportive therapy, including physical therapy/Occupational Therapy consults.    Review of Systems: As per HPI otherwise 10 point review of systems negative.   Past Medical History:  Diagnosis Date   Anemia    Cough    RESOLVING, NO FEVER   Diabetes mellitus without complication (HCC)    Gout    Hypertension    Hypothyroidism    PUD (peptic ulcer disease)    Wears dentures    full upper, partial lower    Past Surgical History:  Procedure Laterality Date   AMPUTATION TOE Left 12/14/2017   Procedure: AMPUTATION TOE-4TH MPJ;  Surgeon: Gwyneth RevelsFowler, Devaunte Gasparini, DPM;  [facility];  Service: Podiatry;  Laterality: Left;  IVA LOCAL Diabetic - oral meds   APPENDECTOMY     CATARACT EXTRACTION W/PHACO Right 06/23/2015   Procedure: CATARACT EXTRACTION PHACO AND INTRAOCULAR LENS PLACEMENT (IOC);  Surgeon: Sallee LangeSteven Dingeldein, MD;  Location: [facility] ORS;  Service: Ophthalmology;  Laterality: Right;  US 01:28 AP% 23.4 CDE 36.14 fluid pack lot # [id number]   CATARACT EXTRACTION W/PHACO Left 07/28/2015   Procedure: CATARACT EXTRACTION PHACO AND INTRAOCULAR LENS PLACEMENT (IOC);  Surgeon: Sallee LangeSteven Dingeldein, MD;  Location: [facility] ORS;  Service: Ophthalmology;  Laterality: Left;  US    1:29.7 AP%  24.9 CDE   41.32 fluid casette lot #[id number] H exp 09/23/2016   COONOSCOPY     AND ENDOSCOPY   DILATION AND CURETTAGE OF UTERUS     TONSILLECTOMY     TUBAL LIGATION      Social History:  reports that she quit smoking about 36 years ago. Her smoking use included cigarettes. She has never used smokeless tobacco. She reports that she does not drink alcohol. No history on file for drug use.   Allergies  Allergen Reactions   Penicillins Hives   Sulfa Antibiotics Hives   Tetanus  Toxoids Swelling   Procaine Other (See Comments)    "went into shock"   Tuberculin Tests Swelling and Rash    Family History  Problem Relation Age of Onset   Breast cancer Neg Hx     Family history reviewed and not pertinent    Prior to Admission medications   Medication Sig Start Date End Date Taking? Authorizing Provider  fosinopril (MONOPRIL) 10 MG tablet Take 10 mg by mouth at bedtime.    [provider]  glucose blood (ACCU-CHEK COMPACT PLUS) test strip USE TO TEST BLOOD SUGAR DAILY. DX CODE: E11.9 05/24/16   [provider]  HYDROcodone-acetaminophen (NORCO) 5-325 MG tablet Take 1 tablet by mouth every 6 (six) hours as needed for moderate pain. 12/14/17   Gwyneth RevelsFowler, Matheo Rathbone, DPM  iron polysaccharides (FERREX 150) 150 MG capsule Take by mouth. 12/02/16 12/06/17  [provider]  levothyroxine (SYNTHROID, LEVOTHROID) 88 MCG tablet Take 88 mcg by mouth daily before breakfast.    [provider]  metFORMIN (GLUCOPHAGE-XR) 500 MG 24 hr tablet Take 500 mg by mouth 2 (two) times daily. 2 PILLS AM   1 PILL PM (12/06/17 - only takes 1 pill PM)    [provider]  Multiple Vitamins-Minerals (ICAPS AREDS 2 PO) Take by mouth 2 (two) times daily. Reported on 06/23/2015  [provider]  simvastatin (ZOCOR) 20 MG tablet Take 20 mg by mouth daily.    [provider]  vitamin B-12 (CYANOCOBALAMIN) 1000 MCG tablet Take 2,000 mcg by mouth daily.     [provider]     Objective    Physical Exam: Vitals:   10/19/20 2052 10/19/20 2054 10/19/20 2244  BP: (!) 196/67  (!) 171/96  Pulse: 90  69  Resp: 16  18  Temp: 98.4 F (36.9 C)    TempSrc: Oral    SpO2: 97%  98%  Weight:  59.4 kg   Height:  5\' 3"  (1.6 m)     General: appears to be stated age; alert, oriented Skin: warm, dry, no rash Head:  AT/[place] Mouth:  Oral mucosa membranes appear moist, normal dentition Neck: supple; trachea midline Heart:  RRR; did not appreciate  any M/R/G Lungs: CTAB, did not appreciate any wheezes, rales, or rhonchi Abdomen: + BS; soft, ND, NT Vascular: 2+ pedal pulses b/l; 2+ radial pulses b/l Extremities: no peripheral edema, no muscle wasting Neuro:  5/5 strength of the proximal and distal flexors and extensors of the upper and lower extremities bilaterally; sensation intact in upper and lower extremities b/l; cranial nerves II through XII grossly intact; no pronator drift; no evidence suggestive of slurred speech, dysarthria, or facial droop; Normal muscle tone. No tremors.    Labs on Admission: I have personally reviewed following labs and imaging studies  CBC: Recent Labs  Lab 10/19/20 2101  WBC 8.1  NEUTROABS 3.9  HGB 11.9*  HCT 35.7*  MCV 94.9  PLT 242   Basic Metabolic Panel: Recent Labs  Lab 10/19/20 2101  NA 135  K 4.3  CL 107  CO2 20*  GLUCOSE 245*  BUN 28*  CREATININE 1.40*  CALCIUM 8.9   GFR: Estimated Creatinine Clearance: 22.1 mL/min (A) (by C-G formula based on SCr of 1.4 mg/dL (H)). Liver Function Tests: Recent Labs  Lab 10/19/20 2101  AST 18  ALT 12  ALKPHOS 103  BILITOT 0.7  PROT 7.3  ALBUMIN 4.1   No results for input(s): LIPASE, AMYLASE in the last 168 hours. No results for input(s): AMMONIA in the last 168 hours. Coagulation Profile: Recent Labs  Lab 10/19/20 2101  INR 1.1   Cardiac Enzymes: No results for input(s): CKTOTAL, CKMB, CKMBINDEX, TROPONINI in the last 168 hours. BNP (last 3 results) No results for input(s): PROBNP in the last 8760 hours. HbA1C: No results for input(s): HGBA1C in the last 72 hours. CBG: Recent Labs  Lab 10/19/20 2242  GLUCAP 167*   Lipid Profile: No results for input(s): CHOL, HDL, LDLCALC, TRIG, CHOLHDL, LDLDIRECT in the last 72 hours. Thyroid Function Tests: No results for input(s): TSH, T4TOTAL, FREET4, T3FREE, THYROIDAB in the last 72 hours. Anemia Panel: No results for input(s): VITAMINB12, FOLATE, FERRITIN, TIBC, IRON,  RETICCTPCT in the last 72 hours. Urine analysis:    Component Value Date/Time   COLORURINE STRAW (A) 10/19/2020 2226   APPEARANCEUR CLEAR (A) 10/19/2020 2226   LABSPEC 1.009 10/19/2020 2226   PHURINE 5.0 10/19/2020 2226   GLUCOSEU NEGATIVE 10/19/2020 2226   HGBUR NEGATIVE 10/19/2020 2226   BILIRUBINUR NEGATIVE 10/19/2020 2226   KETONESUR NEGATIVE 10/19/2020 2226   PROTEINUR NEGATIVE 10/19/2020 2226   NITRITE NEGATIVE 10/19/2020 2226   LEUKOCYTESUR TRACE (A) 10/19/2020 2226    Radiological Exams on Admission: CT ANGIO HEAD NECK W WO CM  Result Date: 10/19/2020 CLINICAL DATA:  Initial evaluation for acute right-sided weakness,  speech difficulty. EXAM: CT ANGIOGRAPHY HEAD AND NECK TECHNIQUE: Multidetector CT imaging of the head and neck was performed using the standard protocol during bolus administration of intravenous contrast. Multiplanar CT image reconstructions and MIPs were obtained to evaluate the vascular anatomy. Carotid stenosis measurements (when applicable) are obtained utilizing NASCET criteria, using the distal internal carotid diameter as the denominator. CONTRAST:  22mL OMNIPAQUE IOHEXOL 350 MG/ML SOLN COMPARISON:  None. FINDINGS: CT HEAD FINDINGS Brain: Generalized age-related cerebral atrophy. Few small remote lacunar infarcts noted about the right basal ganglia and right thalamus. No acute intracranial hemorrhage. No acute large vessel territory infarct. No mass lesion, midline shift or mass effect. No hydrocephalus or extra-axial fluid collection. Vascular: No hyperdense vessel. Scattered vascular calcifications noted within the carotid siphons. Skull: Scalp soft tissues demonstrate no acute finding. Calvarium intact. Sinuses: Changes of chronic sinusitis noted. No active disease. No mastoid effusion. Orbits:  Globes orbital soft tissues demonstrate no acute finding. CTA NECK FINDINGS Aortic arch: Visualized aortic arch normal caliber with normal 3 vessel morphology. Moderate  atheromatous change about the arch and origin of the great vessels without high-grade stenosis. Right carotid system: Right CCA patent from its origin to the bifurcation without stenosis. Eccentric mixed plaque at the right carotid bulb/proximal right ICA with associated mild 35% stenosis by NASCET criteria. Right ICA patent distally without stenosis, dissection or occlusion. Left carotid system: The CCA patent from its origin to the bifurcation without stenosis. Bulky calcified plaque about the left carotid bulb with associated stenosis of up to 70% by NASCET criteria. Left ICA patent distally without stenosis, dissection or occlusion. Vertebral arteries: Both vertebral arteries arise from subclavian arteries. No significant proximal subclavian artery stenosis. Strongly dominant left vertebral artery with a diffusely hypoplastic right vertebral artery. Atheromatous plaque at the origin of the dominant left vertebral artery with associated severe stenosis (series 10, image 77). Dominant left vertebral artery otherwise patent within the neck. Focal severe distal right V3 stenosis noted just prior to cranial vault (series 10, image 196). Skeleton: No visible acute osseous finding. No discrete or worrisome osseous lesions. Other neck: No other acute soft tissue abnormality within the neck. No mass or adenopathy. Upper chest: Scattered interlobular septal thickening noted within the visualized lungs, suggesting mild pulmonary interstitial edema. Mildly prominent precarinal node measures up to the upper limits of normal at 1 cm. Visualized upper chest demonstrates no other acute finding. Review of the MIP images confirms the above findings CTA HEAD FINDINGS Anterior circulation: Petrous segments patent bilaterally. Scattered atheromatous change within the carotid siphons without high-grade stenosis. A1 segments patent bilaterally. Normal anterior communicating artery complex. Anterior cerebral arteries patent to their  distal aspects without stenosis. No M1 stenosis or occlusion. Normal MCA bifurcations. Distal MCA branches well perfused and symmetric. Posterior circulation: Dominant left vertebral artery widely patent to the vertebrobasilar junction. Right vertebral artery markedly hypoplastic and irregular, but is grossly patent to the vertebrobasilar junction as well. Right PICA origin patent. Left PICA not seen. Basilar patent to its distal aspect without stenosis. Superior cerebral arteries patent bilaterally. Left PCA supplied via the basilar and is widely patent to its distal aspect. Fetal type origin of the right PCA. Atheromatous change throughout the right PCA with associated moderate multifocal P2/P3 stenoses. Venous sinuses: Grossly patent allowing for timing the contrast bolus. Anatomic variants: Fetal type origin of the right PCA.  No aneurysm. Review of the MIP images confirms the above findings IMPRESSION: CT HEAD IMPRESSION: 1. No acute intracranial abnormality. 2.  Few small remote lacunar infarcts about the right basal ganglia and thalamus. CTA HEAD AND NECK IMPRESSION: 1. Negative CTA for emergent large vessel occlusion. 2. Bulky calcified plaque about the left carotid bulb with associated stenosis of up to 70% by NASCET criteria. 3. Atheromatous plaque at the origin of the dominant left vertebral artery with associated severe stenosis. Strongly dominant left vertebral artery otherwise patent within the neck. 4. Atheromatous change throughout the right PCA with associated moderate multifocal P2/P3 stenoses. 5. Mild pulmonary interstitial edema. Electronically Signed   By: Rise Mu M.D.   On: 10/19/2020 22:55     EKG: Independently reviewed, with result as described above.    Assessment/Plan   Cassandra Schaefer is a 87 y.o. female with medical history significant for type 2 diabetes mellitus, essential hypertension, acquired hypothyroidism, hyperlipidemia, stage III chronic kidney disease with  baseline creatinine 1.4-1.7, who is admitted to [facility] on 10/19/2020 with suspected TIA versus acute ischemic CVA after presenting from home to [facility] ED complaining of right-sided weakness.    Principal Problem:   TIA (transient ischemic attack) Active Problems:   Hyperlipidemia, unspecified   Hypertension   Hypothyroidism, acquired   Type 2 diabetes mellitus (HCC)   Right hemiparesis (HCC)     #) Acute ischemic CVA: Presentation concerning for TIA versus acute ischemic stroke on the basis of acute onset of right hemiparesis at 12:30 PM on 10/19/2020, with CT head showing no evidence of acute intracranial process, including no evidence of acute intracranial hemorrhage. Additionally, CTA head and neck showed no evidence of dissection, aneurysm, thrombus, or flow-limiting stenosis.  Deficits have improved to the point where there are no residual objective acute neurologic findings at this time, with the patient reporting very mild subjective residual weakness in the right upper and lower extremity relative to her baseline strength in these extremities.  Presentation not associated with any additional acute focal neurologic deficits.  She presented to [facility] ED at 2148, and was therefore outside of tPA window at time of presentation.  She took a full dose aspirin at home prior to presenting to ED.  Of note, the patient reportedly possesses multiple modifiable CVA risk factors including a history of type 2 diabetes mellitus, hypertension, hyperlipidemia.  Additionally, she has a former history of smoking, having completely quit smoking in the 1980s, with confirmation of subsequent resumption of smoking.  Denies any known underlying history of paroxysmal atrial fibrillation or obstructive sleep apnea.  EKG today shows normal sinus rhythm without evidence of acute ischemic changes, as further detailed above.  Will check MRI brain to further elucidate TIA versus acute ischemic  stroke. In the meantime we will start patient on daily baby aspirin, with potential need for introduction of brief course of dual antiplatelet therapy depending upon ensuing result of MRI brain.  This is in the context of the patient reporting no use of daily blood thinners as an outpatient, including no daily use of aspirin.  We will allow for permissive hypertension for 24 hours following the onset of acute focal neurologic deficits, with associated parameters further quantified below, and with period of observance of permissive hypertension and at 12:30 PM on 10/20/2020.     Plan: Nursing bedside swallow evaluation x 1 now, and will not initiate oral medications or diet until the patient has passed this. Head of the bed at 30 degrees. Neuro checks per protocol. VS per protocol. Will allow for permissive hypertension for 24 hours following onset of acute  focal neurologic deficits, during which will hold home antihypertensive medications, with prn IV hydralazine ordered for systolic blood pressure greater than 220 mmHg or diastolic blood pressure greater than 110 mmHg until 1230 PM on 10/20/20. Monitor on telemetry, including monitoring for atrial fibrillation as modifiable risk factor for acute ischemic CVA.  Should overnight telemetry not demonstrate evidence of atrial fibrillation, can consider 30-day cardiac event monitor at the time of discharge to further evaluate for this arrhythmia. MRI brain. TTE with bubble study has been ordered for the morning to evaluate for intracardiac thrombus, septal wall aneurysm, or septal wall defect. Check lipid panel and A1c. PT/OT consults have been ordered to occur in the morning.  Aspirin 81 mg p.o. daily, as above.       #) Type 2 diabetes mellitus: Documented history of such on metformin has resolved patient on oral hypoglycemic agent at home.  Not on any exogenous insulin as outpatient.  Will check hemoglobin A1c as component evaluation modifiable acute ischemic  CVA risk factors, as above.  Plan: Hold on metformin for now.  Accu-Cheks before every meal and at bedtime with low-dose ion scale insulin ordered.  Check hemoglobin A1c, as above.        #) Acquired hypothyroidism: On Synthroid as an outpatient.  Plan: Continue home Synthroid.       #) Hyperlipidemia: On simvastatin as an outpatient.  We will check lipid panel as component of evaluation for potential modifiable acute ischemic CVA risk factors, as further detailed above.  Plan: Continue home statin.  Check lipid panel, as above.      #) Stage IIIb chronic kidney disease: Associated with baseline creatinine range 1.4-1.7, with presenting creatinine noted to be within this range at 1.4, relative to most recent prior value 1.7 on 08/27/2020.  Plan: Monitor strict I's and O's and weights.  Attempt to avoid nephrotoxic agents.  Repeat BMP in the morning.  Add on serum magnesium level.      DVT prophylaxis: SCDs Code Status: Full code Family Communication: none Disposition Plan: Per Rounding Team Consults called: none  Admission status: Observation; med telemetry     Of note, this patient was added by me to the following Admit List/Treatment Team: armcadmits.      PLEASE NOTE THAT DRAGON DICTATION SOFTWARE WAS USED IN THE CONSTRUCTION OF THIS NOTE.   Angie Fava DO [facility] Pager [phone] From 6PM - 6AM  Otherwise, please contact night-coverage  [email or web address] Password [facility]   10/19/2020, 11:23 PM

## 2020-10-19 NOTE — Progress Notes (Signed)
Brief note regarding plan, with full H&P to follow:  87 year old female with multiple acute ischemic CVA respecters, including type 2 diabetes mellitus, who is admitted for further evaluation and management suspected TIA versus ischemic stroke after presenting with acute right hemiparesis starting at 1230 PM on 10/19/20.  CT head shows no evidence of acute intracranial process.  CTA head and neck show no evidence of large vessel flow-limiting stenosis or thrombus.  Right hemiparesis improving.  Presents outside of tPA window.  MRI brain pending.  Will further assess for modifiable acute ischemic CVA risk factors, consult PT/OT.  Check echo with bubble study.  Of note patient already received a full dose aspirin at home today.  Acute ischemic stroke order set initiated.    Newton Pigg, DO Hospitalist

## 2020-10-19 NOTE — ED Provider Notes (Signed)
[facility] Emergency Department Provider Note  ____________________________________________   Event Date/Time   First MD Initiated Contact with Patient 10/19/20 2148     (approximate)  I have reviewed the triage vital signs and the nursing notes.   HISTORY  Chief Complaint Cerebrovascular Accident   HPI Merita Hawks is a 87 y.o. female with a past medical history of HTN, hypothyroidism, peptic ulcer disease, DM, HDL, and anemia who presents for assessment of difficulty ambulating and weakness in her right arm and right leg as well as difficulty finding her words that she states began around 1230 today when she got back from church.  She did receive 324 mg of ASA from family.  She denies any left-sided symptoms or vision changes or facial droop.  She denies any recent falls or injuries.  She has never had a stroke and does not take any blood thinners.  She denies any headache, earache, sore throat, chest pain, cough, shortness of breath, nausea, vomiting, Derica dysuria, rash or any other associated sick symptoms at this time.  No other acute concerns at this time.         Past Medical History:  Diagnosis Date   Anemia    Cough    RESOLVING, NO FEVER   Diabetes mellitus without complication (HCC)    Gout    Hypertension    Hypothyroidism    PUD (peptic ulcer disease)    Wears dentures    full upper, partial lower    Patient Active Problem List   Diagnosis Date Noted   Hyperlipidemia, unspecified 07/19/2017   Hypertension 07/19/2017   PUD (peptic ulcer disease) 07/19/2017   Type 2 diabetes mellitus (HCC) 07/19/2017   Personal history of gout 08/12/2016   Anemia, unspecified 04/09/2016   Hypothyroidism, acquired 12/10/2015    Past Surgical History:  Procedure Laterality Date   AMPUTATION TOE Left 12/14/2017   Procedure: AMPUTATION TOE-4TH MPJ;  Surgeon: Gwyneth Revels, DPM;  Location: [facility];  Service: Podiatry;  Laterality:  Left;  IVA LOCAL Diabetic - oral meds   APPENDECTOMY     CATARACT EXTRACTION W/PHACO Right 06/23/2015   Procedure: CATARACT EXTRACTION PHACO AND INTRAOCULAR LENS PLACEMENT (IOC);  Surgeon: Sallee Lange, MD;  Location: [facility];  Service: Ophthalmology;  Laterality: Right;  [place] 01:28 AP% 23.4 CDE 36.14 fluid pack lot # [id number]   CATARACT EXTRACTION W/PHACO Left 07/28/2015   Procedure: CATARACT EXTRACTION PHACO AND INTRAOCULAR LENS PLACEMENT (IOC);  Surgeon: Sallee Lange, MD;  Location: [facility];  Service: Ophthalmology;  Laterality: Left;  [place]    1:29.7 AP%  24.9 CDE   41.32 fluid casette lot #[id number] H exp 09/23/2016   COONOSCOPY     AND ENDOSCOPY   DILATION AND CURETTAGE OF UTERUS     TONSILLECTOMY     TUBAL LIGATION      Prior to Admission medications   Medication Sig Start Date End Date Taking? Authorizing Provider  fosinopril (MONOPRIL) 10 MG tablet Take 10 mg by mouth at bedtime.    [provider]  glucose blood (ACCU-CHEK COMPACT PLUS) test strip USE TO TEST BLOOD SUGAR DAILY. DX CODE: E11.9 05/24/16   [provider]  HYDROcodone-acetaminophen (NORCO) 5-325 MG tablet Take 1 tablet by mouth every 6 (six) hours as needed for moderate pain. 12/14/17   Gwyneth Revels, DPM  iron polysaccharides (FERREX 150) 150 MG capsule Take by mouth. 12/02/16 12/06/17  [provider]  levothyroxine (SYNTHROID, LEVOTHROID) 88 MCG tablet Take 88 mcg  by mouth daily before breakfast.    [provider]  metFORMIN (GLUCOPHAGE-XR) 500 MG 24 hr tablet Take 500 mg by mouth 2 (two) times daily. 2 PILLS AM   1 PILL PM (12/06/17 - only takes 1 pill PM)    [provider]  Multiple Vitamins-Minerals (ICAPS AREDS 2 PO) Take by mouth 2 (two) times daily. Reported on 06/23/2015    [provider]  simvastatin (ZOCOR) 20 MG tablet Take 20 mg by mouth daily.    [provider]  vitamin B-12 (CYANOCOBALAMIN) 1000 MCG tablet Take 2,000 mcg by mouth daily.      [provider]    Allergies Penicillins, Sulfa antibiotics, Tetanus toxoids, Procaine, and Tuberculin tests  Family History  Problem Relation Age of Onset   Breast cancer Neg Hx     Social History Social History   Tobacco Use   Smoking status: Former    Pack years: 0.00    Types: Cigarettes    Quit date: 12/26/1983    Years since quitting: 36.8   Smokeless tobacco: Never  Vaping Use   Vaping Use: Never used  Substance Use Topics   Alcohol use: No    Review of Systems  Review of Systems  Constitutional:  Negative for chills and fever.  HENT:  Negative for sore throat.   Eyes:  Negative for pain.  Respiratory:  Negative for cough and stridor.   Cardiovascular:  Negative for chest pain.  Gastrointestinal:  Negative for vomiting.  Genitourinary:  Negative for dysuria.  Musculoskeletal:  Negative for myalgias.  Skin:  Negative for rash.  Neurological:  Positive for sensory change, speech change, focal weakness and weakness. Negative for seizures, loss of consciousness and headaches.  Psychiatric/Behavioral:  Negative for suicidal ideas.   All other systems reviewed and are negative.    ____________________________________________   PHYSICAL EXAM:  VITAL SIGNS: ED Triage Vitals  Enc Vitals Group     BP 10/19/20 2052 (!) 196/67     Pulse Rate 10/19/20 2052 90     Resp 10/19/20 2052 16     Temp 10/19/20 2052 98.4 F (36.9 C)     Temp Source 10/19/20 2052 Oral     SpO2 10/19/20 2052 97 %     Weight 10/19/20 2054 131 lb (59.4 kg)     Height 10/19/20 2054  (1.6 m)     Head Circumference --      Peak Flow --      Pain Score 10/19/20 2053 0     Pain Loc --      Pain Edu? --      Excl. in GC? --    Vitals:   10/19/20 2052 10/19/20 2244  BP: (!) 196/67 (!) 171/96  Pulse: 90 69  Resp: 16 18  Temp: 98.4 F (36.9 C)   SpO2: 97% 98%   Physical Exam Vitals and nursing note reviewed.  Constitutional:      General: She is not in acute  distress.    Appearance: She is well-developed.  HENT:     Head: Normocephalic and atraumatic.     Right Ear: External ear normal.     Left Ear: External ear normal.     Nose: Nose normal.     Mouth/Throat:     Mouth: Mucous membranes are moist.  Eyes:     Conjunctiva/sclera: Conjunctivae normal.  Cardiovascular:     Rate and Rhythm: Normal rate and regular rhythm.     Heart sounds:  No murmur heard. Pulmonary:     Effort: Pulmonary effort is normal. No respiratory distress.     Breath sounds: Normal breath sounds.  Abdominal:     Palpations: Abdomen is soft.     Tenderness: There is no abdominal tenderness.  Musculoskeletal:     Cervical back: Neck supple.  Skin:    General: Skin is warm and dry.     Capillary Refill: Capillary refill takes less than 2 seconds.  Neurological:     Mental Status: She is alert and oriented to person, place, and time.     Motor: Weakness present.    No pronator drift or finger dysmetria.  Very slight decrease creasing grip strength on the right compared to the left.  Patient is able to hold her heels off bed for 10 seconds both lower extremities.  Sensation is intact light touch in all extremities.  Cranial nerves II through XII grossly intact.  On trial of ambulation patient states she feels she is unable to walk. ____________________________________________   LABS (all labs ordered are listed, but only abnormal results are displayed)  Labs Reviewed  CBC - Abnormal; Notable for the following components:      Result Value   RBC 3.76 (*)    Hemoglobin 11.9 (*)    HCT 35.7 (*)    All other components within normal limits  DIFFERENTIAL - Abnormal; Notable for the following components:   Eosinophils Absolute 0.9 (*)    All other components within normal limits  COMPREHENSIVE METABOLIC PANEL - Abnormal; Notable for the following components:   CO2 20 (*)    Glucose, Bld 245 (*)    BUN 28 (*)    Creatinine, Ser 1.40 (*)    GFR, Estimated 36 (*)     All other components within normal limits  URINALYSIS, COMPLETE (UACMP) WITH MICROSCOPIC - Abnormal; Notable for the following components:   Color, Urine STRAW (*)    APPearance CLEAR (*)    Leukocytes,Ua TRACE (*)    Bacteria, UA RARE (*)    All other components within normal limits  CBG MONITORING, ED - Abnormal; Notable for the following components:   Glucose-Capillary 167 (*)    All other components within normal limits  RESP PANEL BY RT-PCR (FLU A&B, COVID) ARPGX2  URINE CULTURE  PROTIME-INR  APTT  HEMOGLOBIN A1C   ____________________________________________  EKG  Sinus rhythm with a ventricular rate of 81, normal axis, unremarkable intervals without evidence of acute ischemia or significant underlying arrhythmia.  ____________________________________________  RADIOLOGY  ED MD interpretation: CT head shows no clear acute intracranial abnormality although there is evidence of remote prior lacunar infarcts in the right basal ganglia and thalamus.  CTA head and neck shows no large vessel occlusion.  There is fairly significant plaque burden about the left carotid with stenosis up to 70% and at the origin of the dominant left vertebral artery with stenosis.  There is also plaque noted throughout the right PCA associate with stenosis.  Official radiology report(s): CT ANGIO HEAD NECK W WO CM  Result Date: 10/19/2020 CLINICAL DATA:  Initial evaluation for acute right-sided weakness, speech difficulty. EXAM: CT ANGIOGRAPHY HEAD AND NECK TECHNIQUE: Multidetector CT imaging of the head and neck was performed using the standard protocol during bolus administration of intravenous contrast. Multiplanar CT image reconstructions and MIPs were obtained to evaluate the vascular anatomy. Carotid stenosis measurements (when applicable) are obtained utilizing NASCET criteria, using the distal internal carotid diameter as the denominator. CONTRAST:  75mL  OMNIPAQUE IOHEXOL 350 MG/ML SOLN  COMPARISON:  None. FINDINGS: CT HEAD FINDINGS Brain: Generalized age-related cerebral atrophy. Few small remote lacunar infarcts noted about the right basal ganglia and right thalamus. No acute intracranial hemorrhage. No acute large vessel territory infarct. No mass lesion, midline shift or mass effect. No hydrocephalus or extra-axial fluid collection. Vascular: No hyperdense vessel. Scattered vascular calcifications noted within the carotid siphons. Skull: Scalp soft tissues demonstrate no acute finding. Calvarium intact. Sinuses: Changes of chronic sinusitis noted. No active disease. No mastoid effusion. Orbits:  Globes orbital soft tissues demonstrate no acute finding. CTA NECK FINDINGS Aortic arch: Visualized aortic arch normal caliber with normal 3 vessel morphology. Moderate atheromatous change about the arch and origin of the great vessels without high-grade stenosis. Right carotid system: Right CCA patent from its origin to the bifurcation without stenosis. Eccentric mixed plaque at the right carotid bulb/proximal right ICA with associated mild 35% stenosis by NASCET criteria. Right ICA patent distally without stenosis, dissection or occlusion. Left carotid system: The CCA patent from its origin to the bifurcation without stenosis. Bulky calcified plaque about the left carotid bulb with associated stenosis of up to 70% by NASCET criteria. Left ICA patent distally without stenosis, dissection or occlusion. Vertebral arteries: Both vertebral arteries arise from subclavian arteries. No significant proximal subclavian artery stenosis. Strongly dominant left vertebral artery with a diffusely hypoplastic right vertebral artery. Atheromatous plaque at the origin of the dominant left vertebral artery with associated severe stenosis (series 10, image 77). Dominant left vertebral artery otherwise patent within the neck. Focal severe distal right V3 stenosis noted just prior to cranial vault (series 10, image 196).  Skeleton: No visible acute osseous finding. No discrete or worrisome osseous lesions. Other neck: No other acute soft tissue abnormality within the neck. No mass or adenopathy. Upper chest: Scattered interlobular septal thickening noted within the visualized lungs, suggesting mild pulmonary interstitial edema. Mildly prominent precarinal node measures up to the upper limits of normal at 1 cm. Visualized upper chest demonstrates no other acute finding. Review of the MIP images confirms the above findings CTA HEAD FINDINGS Anterior circulation: Petrous segments patent bilaterally. Scattered atheromatous change within the carotid siphons without high-grade stenosis. A1 segments patent bilaterally. Normal anterior communicating artery complex. Anterior cerebral arteries patent to their distal aspects without stenosis. No M1 stenosis or occlusion. Normal MCA bifurcations. Distal MCA branches well perfused and symmetric. Posterior circulation: Dominant left vertebral artery widely patent to the vertebrobasilar junction. Right vertebral artery markedly hypoplastic and irregular, but is grossly patent to the vertebrobasilar junction as well. Right PICA origin patent. Left PICA not seen. Basilar patent to its distal aspect without stenosis. Superior cerebral arteries patent bilaterally. Left PCA supplied via the basilar and is widely patent to its distal aspect. Fetal type origin of the right PCA. Atheromatous change throughout the right PCA with associated moderate multifocal P2/P3 stenoses. Venous sinuses: Grossly patent allowing for timing the contrast bolus. Anatomic variants: Fetal type origin of the right PCA.  No aneurysm. Review of the MIP images confirms the above findings IMPRESSION: CT HEAD IMPRESSION: 1. No acute intracranial abnormality. 2. Few small remote lacunar infarcts about the right basal ganglia and thalamus. CTA HEAD AND NECK IMPRESSION: 1. Negative CTA for emergent large vessel occlusion. 2. Bulky  calcified plaque about the left carotid bulb with associated stenosis of up to 70% by NASCET criteria. 3. Atheromatous plaque at the origin of the dominant left vertebral artery with associated severe stenosis. Strongly dominant left  vertebral artery otherwise patent within the neck. 4. Atheromatous change throughout the right PCA with associated moderate multifocal P2/P3 stenoses. 5. Mild pulmonary interstitial edema. Electronically Signed   By: Rise Mu M.D.   On: 10/19/2020 22:55    ____________________________________________   PROCEDURES  Procedure(s) performed (including Critical Care):  .1-3 Lead EKG Interpretation  Date/Time: 10/19/2020 11:07 PM Performed by: Gilles Chiquito, MD Authorized by: Gilles Chiquito, MD     Interpretation: normal     ECG rate assessment: normal     Rhythm: sinus rhythm     Ectopy: none     Conduction: normal     ____________________________________________   INITIAL IMPRESSION / ASSESSMENT AND PLAN / ED COURSE      Patient presents with above-stated history and exam for assessment of acute onset of right arm and right leg weakness around 1230 today nontraumatic is associate with some difficulty ambulating.  Patient also think she was having some difficulty with her words earlier.  My assessment she does have some very subtle weakness in her right arm but otherwise grossly normal supine exam although unable to ambulate sitting her legs will not cooperate.  Given onset of symptoms within 24 hours and concern for possible stroke will order CT head as well as CTA head and neck and perfusion studies.  Additional differential includes possible atypical migraine, metabolic derangements there is no history or exam features to suggest precipitating trauma or deep space infection in the head or neck.  CG has no evidence of arrhythmia or ischemia.  CBC has no leukocytosis and hemoglobin of 11.9 prior to 11.6 on 5/4.  This is not suggestive of acute  symptomatic anemia.  CMP remarkable for glucose of 245 and a creatinine of 1.4.  No other significant electrode or metabolic derangements.  On 5/4 patient's creatinine is noted to be 1.7.  Urine has trace leukocyte esterase and rare bacteria but otherwise no evidence of infection.  Will not treat for cystitis, patient denies any burning with urination and does not seem altered.  CT head shows no clear acute intracranial abnormality although there is evidence of remote prior lacunar infarcts in the right basal ganglia and thalamus.  CTA head and neck shows no large vessel occlusion.  There is fairly significant plaque burden about the left carotid with stenosis up to 70% and at the origin of the dominant left vertebral artery with stenosis.  There is also plaque noted throughout the right PCA associate with stenosis.  Alert evidence of hemorrhage or CVA on CT imaging and concern based on history and exam for acute ischemic CVA.  Patient is outside tPA window.  I will admit to medicine service for her to undergo MRI and further work-up.  She notes she is extremely claustrophobic and always gets some benzodiazepine whenever she needs to get MRI.  Will order one-time dose of 2 mg Valium to be used with her MRI.   ____________________________________________   FINAL CLINICAL IMPRESSION(S) / ED DIAGNOSES  Final diagnoses:  Weakness  Stenosis of carotid artery, unspecified laterality    Medications  insulin aspart (novoLOG) injection 0-15 Units (0 Units Subcutaneous Patient Refused/Not Given 10/19/20 2244)  diazepam (VALIUM) tablet 2 mg (has no administration in time range)  iohexol (OMNIPAQUE) 350 MG/ML injection 75 mL (75 mLs Intravenous Contrast Given 10/19/20 2202)     ED Discharge Orders     None        Note:  This document was prepared using Dragon voice recognition  software and may include unintentional dictation errors.    Gilles ChiquitoSmith, Arlin Sass P, MD 10/19/20 2308

## 2020-10-19 NOTE — ED Notes (Signed)
EDP at bedside to update 

## 2020-10-19 NOTE — ED Provider Notes (Signed)
Emergency Medicine Provider Triage Evaluation Note  Cassandra Schaefer , a 87 y.o. female  was evaluated in triage.  Pt complains of right sided weakness and difficulty finding words that began around 12:30 pm today. Denies any hx of stroke. Denies headache, not on blood thinners.  Review of Systems  Positive: Right sided weakness, trouble finding words Negative: Chest pain, shortness of breath  Physical Exam  BP (!) 196/67   Pulse 90   Temp 98.4 F (36.9 C) (Oral)   Resp 16   Ht 5\' 3"  (1.6 m)   Wt 59.4 kg   SpO2 97%   BMI 23.21 kg/m  Gen:   Awake, no distress  Resp:  Normal effort  Neuro:   Noted right sided arm weakness and pronator drift; dysarthric speech noted intermittently Other:    Medical Decision Making  Medically screening exam initiated at 9:23 PM.  Appropriate orders placed.  Bethany Hirt Burditt was informed that the remainder of the evaluation will be completed by another provider, this initial triage assessment does not replace that evaluation, and the importance of remaining in the ED until their evaluation is complete.  Discussed case with Dr. Denice Bors to determine code stroke did not need to be called due to time window of onset of symptoms.   Roxan Hockey, PA 10/19/20 2126    2127, MD 10/19/20 2249

## 2020-10-20 ENCOUNTER — Encounter: Payer: Self-pay | Admitting: Internal Medicine

## 2020-10-20 ENCOUNTER — Observation Stay
Admit: 2020-10-20 | Discharge: 2020-10-20 | Disposition: A | Discharge status: Home / Self Care | Payer: Medicare HMO | Attending: Internal Medicine | Admitting: Internal Medicine

## 2020-10-20 ENCOUNTER — Observation Stay: Payer: Medicare HMO

## 2020-10-20 ENCOUNTER — Inpatient Hospital Stay (HOSPITAL_COMMUNITY)
Admit: 2020-10-20 | Discharge: 2020-10-20 | Disposition: A | Discharge status: Home / Self Care | Payer: Medicare HMO | Attending: Internal Medicine | Admitting: Internal Medicine

## 2020-10-20 ENCOUNTER — Other Ambulatory Visit: Payer: Self-pay

## 2020-10-20 DIAGNOSIS — G8191 Hemiplegia, unspecified affecting right dominant side: Secondary | ICD-10-CM | POA: Diagnosis present

## 2020-10-20 DIAGNOSIS — Z87891 Personal history of nicotine dependence: Secondary | ICD-10-CM | POA: Diagnosis not present

## 2020-10-20 DIAGNOSIS — Z20822 Contact with and (suspected) exposure to covid-19: Secondary | ICD-10-CM | POA: Diagnosis present

## 2020-10-20 DIAGNOSIS — Z79890 Hormone replacement therapy (postmenopausal): Secondary | ICD-10-CM | POA: Diagnosis not present

## 2020-10-20 DIAGNOSIS — I491 Atrial premature depolarization: Secondary | ICD-10-CM | POA: Diagnosis present

## 2020-10-20 DIAGNOSIS — Z01810 Encounter for preprocedural cardiovascular examination: Secondary | ICD-10-CM | POA: Diagnosis not present

## 2020-10-20 DIAGNOSIS — E1122 Type 2 diabetes mellitus with diabetic chronic kidney disease: Secondary | ICD-10-CM | POA: Diagnosis present

## 2020-10-20 DIAGNOSIS — G459 Transient cerebral ischemic attack, unspecified: Secondary | ICD-10-CM | POA: Diagnosis not present

## 2020-10-20 DIAGNOSIS — D72828 Other elevated white blood cell count: Secondary | ICD-10-CM | POA: Diagnosis present

## 2020-10-20 DIAGNOSIS — R4701 Aphasia: Secondary | ICD-10-CM | POA: Diagnosis present

## 2020-10-20 DIAGNOSIS — E781 Pure hyperglyceridemia: Secondary | ICD-10-CM | POA: Diagnosis present

## 2020-10-20 DIAGNOSIS — N1832 Chronic kidney disease, stage 3b: Secondary | ICD-10-CM | POA: Diagnosis present

## 2020-10-20 DIAGNOSIS — I129 Hypertensive chronic kidney disease with stage 1 through stage 4 chronic kidney disease, or unspecified chronic kidney disease: Secondary | ICD-10-CM | POA: Diagnosis present

## 2020-10-20 DIAGNOSIS — I6522 Occlusion and stenosis of left carotid artery: Secondary | ICD-10-CM | POA: Diagnosis not present

## 2020-10-20 DIAGNOSIS — E119 Type 2 diabetes mellitus without complications: Secondary | ICD-10-CM | POA: Diagnosis not present

## 2020-10-20 DIAGNOSIS — F40240 Claustrophobia: Secondary | ICD-10-CM | POA: Diagnosis present

## 2020-10-20 DIAGNOSIS — I6529 Occlusion and stenosis of unspecified carotid artery: Secondary | ICD-10-CM | POA: Diagnosis not present

## 2020-10-20 DIAGNOSIS — Z79899 Other long term (current) drug therapy: Secondary | ICD-10-CM | POA: Diagnosis not present

## 2020-10-20 DIAGNOSIS — I639 Cerebral infarction, unspecified: Secondary | ICD-10-CM | POA: Diagnosis not present

## 2020-10-20 DIAGNOSIS — M109 Gout, unspecified: Secondary | ICD-10-CM | POA: Diagnosis present

## 2020-10-20 DIAGNOSIS — Z7984 Long term (current) use of oral hypoglycemic drugs: Secondary | ICD-10-CM | POA: Diagnosis not present

## 2020-10-20 DIAGNOSIS — R531 Weakness: Secondary | ICD-10-CM | POA: Diagnosis present

## 2020-10-20 DIAGNOSIS — I6389 Other cerebral infarction: Secondary | ICD-10-CM | POA: Diagnosis not present

## 2020-10-20 DIAGNOSIS — R29810 Facial weakness: Secondary | ICD-10-CM | POA: Diagnosis present

## 2020-10-20 DIAGNOSIS — E785 Hyperlipidemia, unspecified: Secondary | ICD-10-CM | POA: Diagnosis present

## 2020-10-20 DIAGNOSIS — R4781 Slurred speech: Secondary | ICD-10-CM | POA: Diagnosis present

## 2020-10-20 DIAGNOSIS — E1165 Type 2 diabetes mellitus with hyperglycemia: Secondary | ICD-10-CM | POA: Diagnosis present

## 2020-10-20 DIAGNOSIS — I63232 Cerebral infarction due to unspecified occlusion or stenosis of left carotid arteries: Secondary | ICD-10-CM | POA: Diagnosis present

## 2020-10-20 DIAGNOSIS — E039 Hypothyroidism, unspecified: Secondary | ICD-10-CM | POA: Diagnosis present

## 2020-10-20 DIAGNOSIS — Z89422 Acquired absence of other left toe(s): Secondary | ICD-10-CM | POA: Diagnosis not present

## 2020-10-20 DIAGNOSIS — I10 Essential (primary) hypertension: Secondary | ICD-10-CM | POA: Diagnosis not present

## 2020-10-20 LAB — COMPREHENSIVE METABOLIC PANEL
ALT: 11 U/L (ref 0–44)
AST: 16 U/L (ref 15–41)
Albumin: 3.5 g/dL (ref 3.5–5.0)
Alkaline Phosphatase: 83 U/L (ref 38–126)
Anion gap: 6 (ref 5–15)
BUN: 26 mg/dL — ABNORMAL HIGH (ref 8–23)
CO2: 23 mmol/L (ref 22–32)
Calcium: 9 mg/dL (ref 8.9–10.3)
Chloride: 111 mmol/L (ref 98–111)
Creatinine, Ser: 1.34 mg/dL — ABNORMAL HIGH (ref 0.44–1.00)
GFR, Estimated: 38 mL/min — ABNORMAL LOW (ref >60)
Glucose, Bld: 144 mg/dL — ABNORMAL HIGH (ref 70–99)
Potassium: 4.2 mmol/L (ref 3.5–5.1)
Sodium: 140 mmol/L (ref 135–145)
Total Bilirubin: 0.5 mg/dL (ref 0.3–1.2)
Total Protein: 6.5 g/dL (ref 6.5–8.1)

## 2020-10-20 LAB — ECHOCARDIOGRAM COMPLETE BUBBLE STUDY
AR max vel: 2.42 cm2
AV Area VTI: 2.66 cm2
AV Area mean vel: 2.12 cm2
AV Mean grad: 4.5 mmHg
AV Peak grad: 9.4 mmHg
Ao pk vel: 1.53 m/s
Area-P 1/2: 3.6 cm2
S' Lateral: 2.02 cm

## 2020-10-20 LAB — CBC
HCT: 32.9 % — ABNORMAL LOW (ref 36.0–46.0)
Hemoglobin: 10.7 g/dL — ABNORMAL LOW (ref 12.0–15.0)
MCH: 30.8 pg (ref 26.0–34.0)
MCHC: 32.5 g/dL (ref 30.0–36.0)
MCV: 94.8 fL (ref 80.0–100.0)
Platelets: 206 10*3/uL (ref 150–400)
RBC: 3.47 MIL/uL — ABNORMAL LOW (ref 3.87–5.11)
RDW: 12.1 % (ref 11.5–15.5)
WBC: 6.7 10*3/uL (ref 4.0–10.5)
nRBC: 0 % (ref 0.0–0.2)

## 2020-10-20 LAB — GLUCOSE, CAPILLARY
Glucose-Capillary: 128 mg/dL — ABNORMAL HIGH (ref 70–99)
Glucose-Capillary: 120 mg/dL — ABNORMAL HIGH (ref 70–99)

## 2020-10-20 LAB — MAGNESIUM: Magnesium: 1.8 mg/dL (ref 1.7–2.4)

## 2020-10-20 IMAGING — MR MR HEAD W/O CM
16 series · 47 of 48 positions shown · non-contrast
Comparison: Prior CTA from 10/19/2020.

CLINICAL DATA: Initial evaluation for neuro deficit, stroke
suspected. Acute right-sided weakness.

EXAM:
MRI HEAD WITHOUT CONTRAST
TECHNIQUE: Multiplanar, multiecho pulse sequences of the brain and surrounding
structures were obtained without intravenous contrast.

[Series 7: cor dwi_tracew · coronal · 5.0mm · 0.65mm/px · 3 of 36 slices shown (1 of 2)]
[im 1/36]
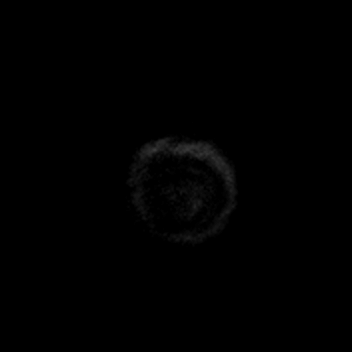
[im 18/36]
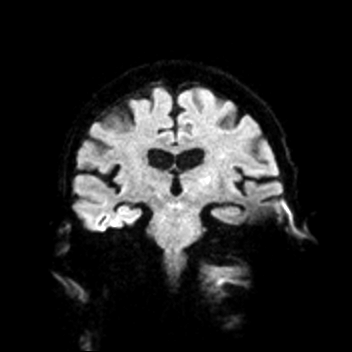
[im 36/36]
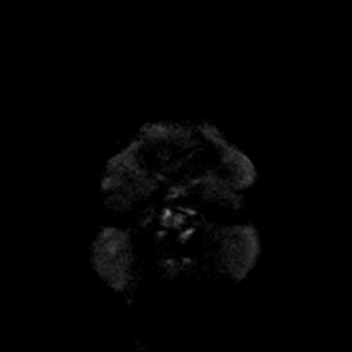

[Series 8: cor dwi_adc · coronal · 5.0mm · 0.65mm/px · 3 of 36 slices shown (1 of 2)]
[im 1/36]
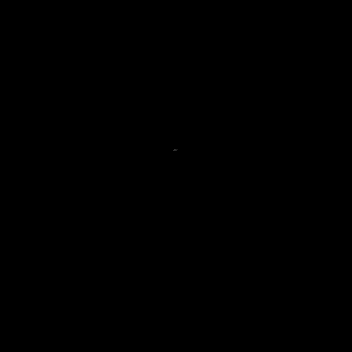
[im 18/36]
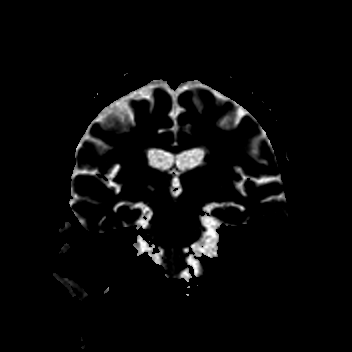
[im 36/36]
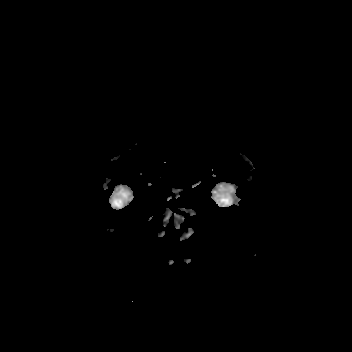

[Series 9: T1 · sagittal · 5.0mm · 0.62mm/px · 2 of 21 slices shown (1 of 2)]
[im 1/21]
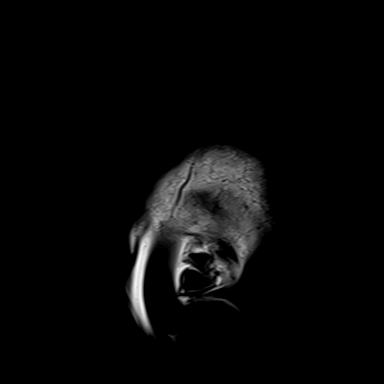
[im 21/21]
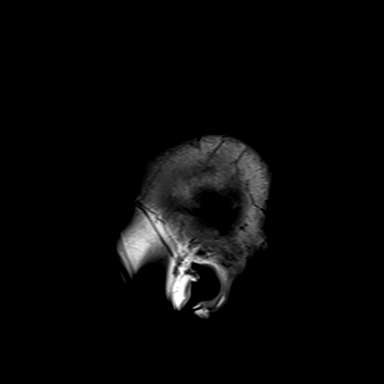

[Series 10: T2 · axial · 5.0mm · 0.53mm/px · 1 of 25 slices shown (1 of 2)]
[im 1/25]
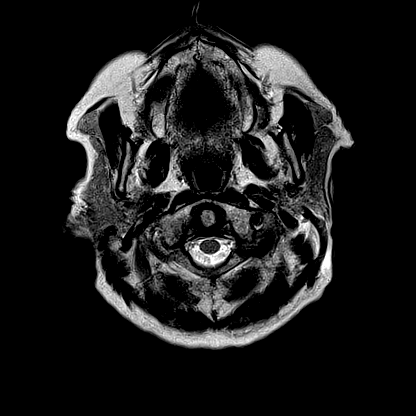

[Series 11: cor dwi_tracew · coronal · 5.0mm · 0.65mm/px · 2 of 36 slices shown (2 of 2)]
[im 1/36]
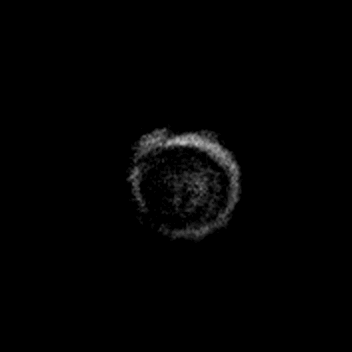
[im 36/36]
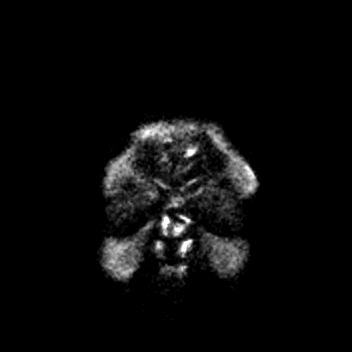

[Series 12: cor dwi_adc · coronal · 5.0mm · 0.65mm/px · 2 of 36 slices shown (2 of 2)]
[im 1/36]
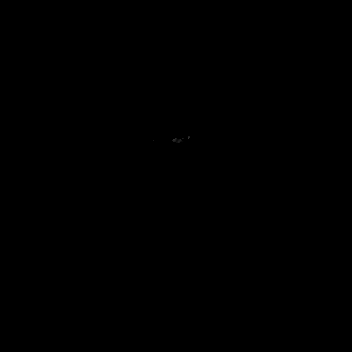
[im 36/36]
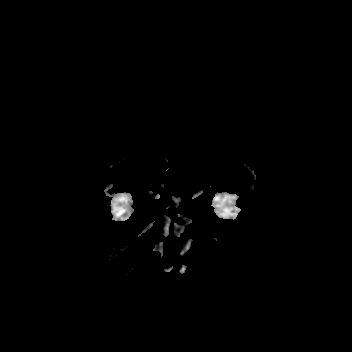

[Series 13: mag_images · axial · 3.0mm · 0.90mm/px · z∈[-115,+60]mm · 3 of 60 slices shown (1 of 2)]
[im 1/60]
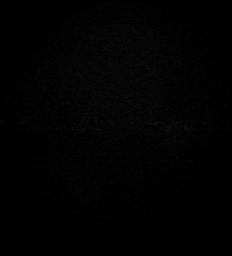
[im 30/60]
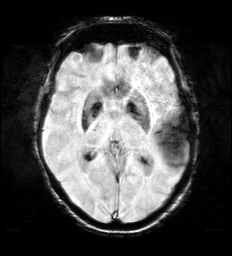
[im 60/60]
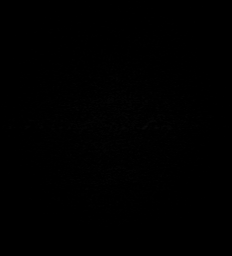

[Series 15: swi_images · axial · 3.0mm · 0.90mm/px · z∈[-115,+60]mm · 3 of 60 slices shown (1 of 2)]
[im 1/60]
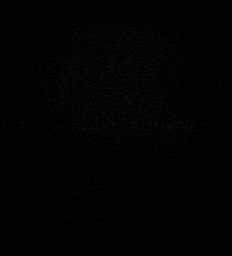
[im 30/60]
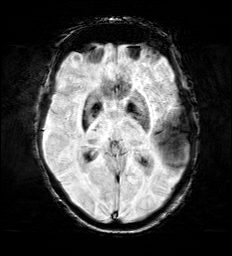
[im 60/60]
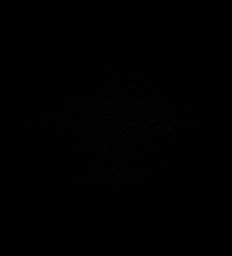

[Series 17: FLAIR · axial · 3.0mm · 0.53mm/px · z∈[-108,+52]mm · 3 of 55 slices shown]
[im 1/55]
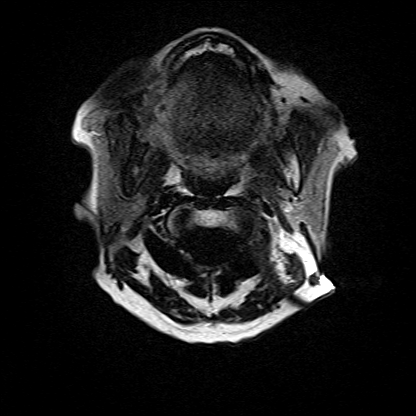
[im 28/55]
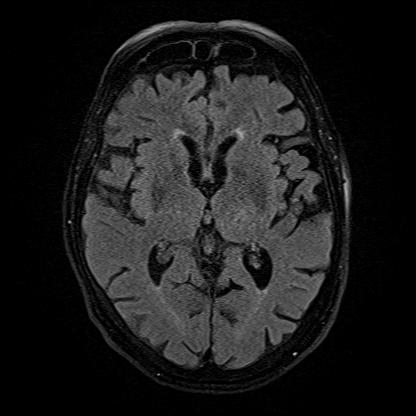
[im 55/55]
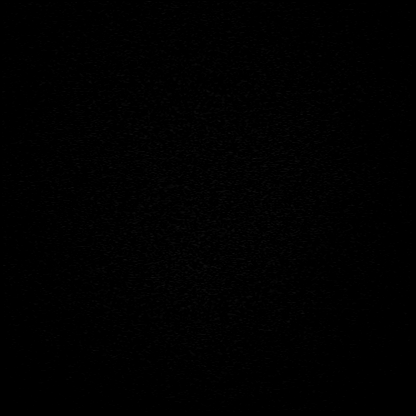

[Series 19: ax dwi_tracew · axial · 3.0mm · 0.65mm/px · z∈[-108,+45]mm · 3 of 48 slices shown]
[im 1/48]
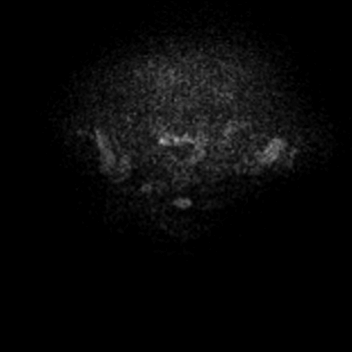
[im 24/48]
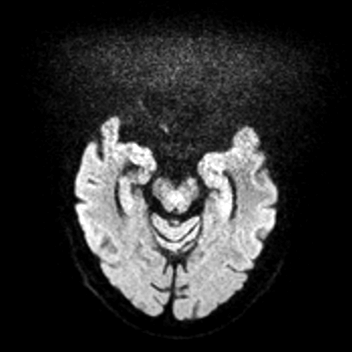
[im 48/48]
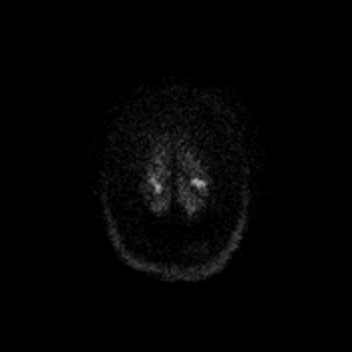

[Series 20: ax dwi_adc · axial · 3.0mm · 0.65mm/px · z∈[-108,+45]mm · 3 of 48 slices shown]
[im 1/48]
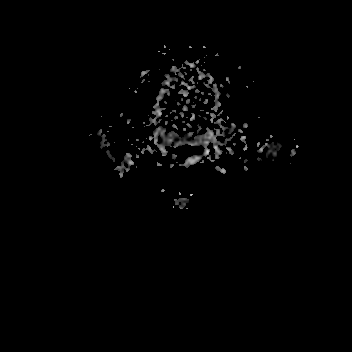
[im 24/48]
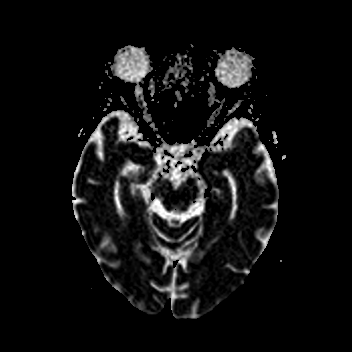
[im 48/48]
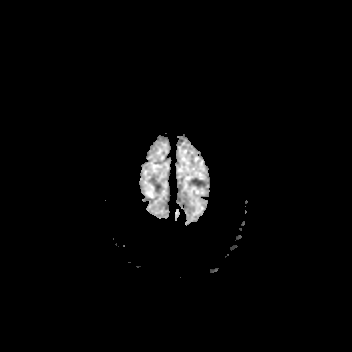

[Series 21: T1 · axial · 1.0mm · 0.98mm/px · z∈[-111,+63]mm · 9 of 176 slices shown (2 of 2)]
[im 1/176]
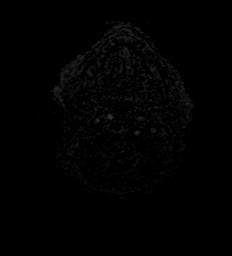
[im 20/176]
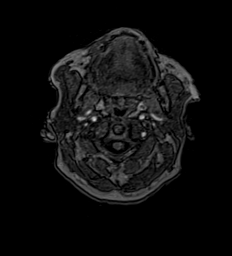
[im 39/176]
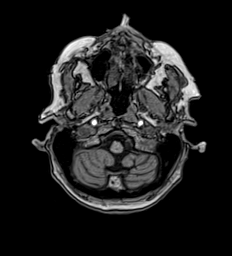
[im 59/176]
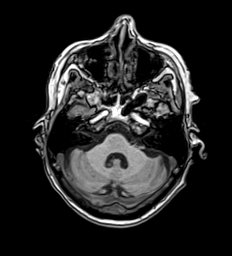
[im 78/176]
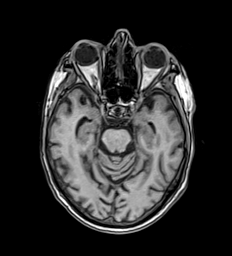
[im 98/176]
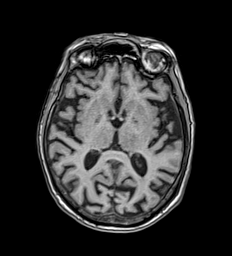
[im 117/176]
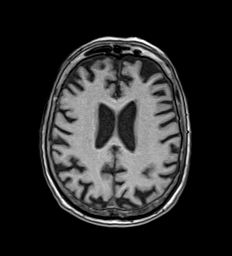
[im 156/176]
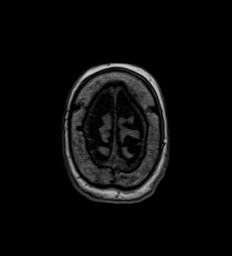
[im 176/176]
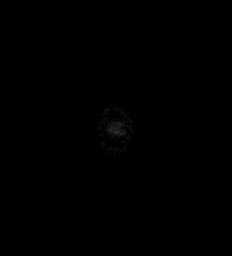

[Series 22: mag_images · axial · 3.0mm · 0.90mm/px · z∈[-115,+60]mm · 3 of 60 slices shown (2 of 2)]
[im 1/60]
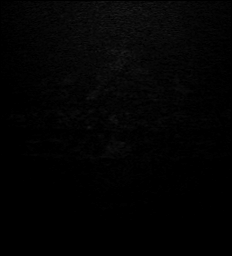
[im 30/60]
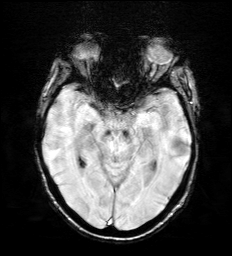
[im 60/60]
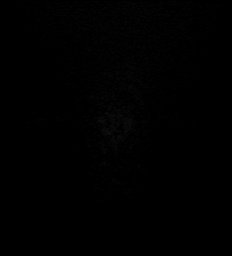

[Series 23: pha_images · axial · 3.0mm · 0.90mm/px · z∈[-110,+60]mm · 3 of 58 slices shown]
[im 1/58]
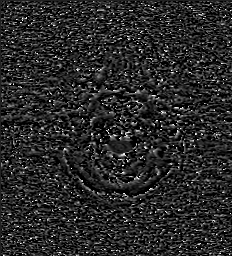
[im 29/58]
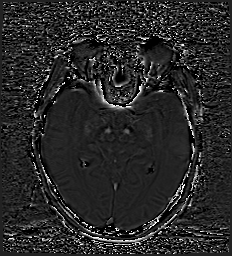
[im 58/58]
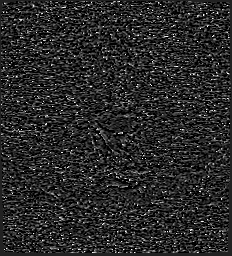

[Series 24: swi_images · axial · 3.0mm · 0.90mm/px · z∈[-115,+60]mm · 3 of 60 slices shown (2 of 2)]
[im 1/60]
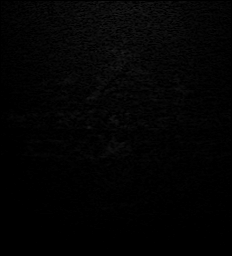
[im 30/60]
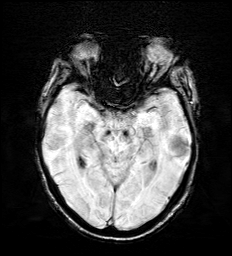
[im 60/60]
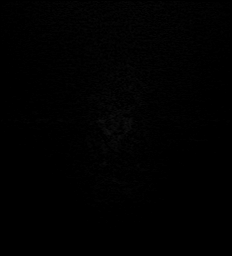

[Series 26: T2 · coronal · 5.0mm · 0.72mm/px · 1 of 26 slices shown (2 of 2)]
[im 1/26]
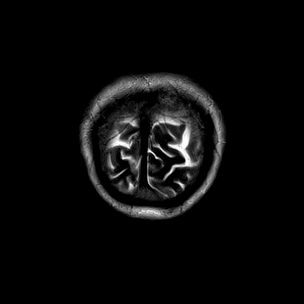

[47 of 48 positions shown; findings below may reference images not displayed]

FINDINGS: Brain: Mild diffuse prominence of the CSF containing spaces
compatible generalized cerebral atrophy. Scattered patchy T2/FLAIR
hyperintensity within the periventricular and deep white matter both
cerebral hemispheres as well as the pons, most consistent with
chronic small vessel ischemic disease, mild for age. Few scattered
remote lacunar infarcts present about the basal ganglia and thalami.

There is a subtle punctate 5 mm focus of diffusion signal
abnormality involving the left basal ganglia, only seen on coronal
DWI sequence (series 7, image 18). Given the patient history of
right-sided weakness, findings suspicious for a tiny punctate
ischemic infarct. No associated hemorrhage or mass effect. No other
diffusion abnormality to suggest acute or subacute ischemia.
Gray-white matter differentiation otherwise maintained. No other
areas of remote cortical infarction. No other evidence for acute or
chronic intracranial hemorrhage.

No mass lesion, midline shift or mass effect. No hydrocephalus or
extra-axial fluid collection. Pituitary gland suprasellar region
within normal limits. Midline structures intact.

Vascular: Major intracranial vascular flow voids are maintained.

Skull and upper cervical spine: Craniocervical junction within
normal limits. Bone marrow signal intensity normal. No scalp soft
tissue abnormality.

Sinuses/Orbits: Patient status post bilateral ocular lens
replacement. Globes and orbital soft tissues within normal limits.
Paranasal sinuses are largely clear. No significant mastoid
effusion. Inner ear structures grossly normal.

Other: None.
IMPRESSION: 1. Subtle 5 mm focus of diffusion signal abnormality involving the
left basal ganglia, suspicious for a tiny ischemic infarct given the
patient history of right-sided weakness. No associated hemorrhage or
mass effect.
2. No other acute intracranial abnormality.
3. Mild chronic microvascular ischemic disease with a few scattered
remote lacunar infarcts about the basal ganglia and thalami.

## 2020-10-20 MED ORDER — ENOXAPARIN SODIUM 30 MG/0.3ML IJ SOSY
30.0000 mg | PREFILLED_SYRINGE | INTRAMUSCULAR | Status: DC
  Administered 2020-10-20 – 2020-10-21 (×2): 30 mg via SUBCUTANEOUS
  Filled 2020-10-20 (×2): qty 0.3

## 2020-10-20 MED ORDER — ATORVASTATIN CALCIUM 20 MG PO TABS
80.0000 mg | ORAL_TABLET | Freq: Every day | ORAL | Status: DC
  Administered 2020-10-21 – 2020-10-24 (×4): 80 mg via ORAL
  Filled 2020-10-20 (×4): qty 4

## 2020-10-20 MED ORDER — ASPIRIN 81 MG PO CHEW
81.0000 mg | CHEWABLE_TABLET | Freq: Every day | ORAL | Status: DC
  Administered 2020-10-20 – 2020-10-24 (×5): 81 mg via ORAL
  Filled 2020-10-20 (×5): qty 1

## 2020-10-20 NOTE — TOC Progression Note (Signed)
Transition of Care [facility]) - Progression Note    Patient Details  Name: Cassandra Schaefer MRN: [id number] Date of Birth: [birth year]  [facility] CM/SW Contact  Caryn Section, RN Phone Number: 10/20/2020, 2:38 PM  Clinical Narrative:   Home Health is recommended, [facility] can accept patient and start home health on Friday, 1 Jul 20222.         Expected Discharge Plan and Services                                                 Social Determinants of Health (SDOH) Interventions    Readmission Risk Interventions No flowsheet data found.

## 2020-10-20 NOTE — Progress Notes (Signed)
PROGRESS NOTE    Cassandra Schaefer  WGN:[id number] DOB: [birth year] DOA: 10/19/2020 PCP: Barbette Reichmann, MD     Brief Narrative:   Hx htn, dm, ckd 3, presenting with right sided weakness, gait dysfunction, and speech dysfunction beginning 12:30 on 6/26. MRI with left basal ganglia small ischemic stroke, no hemorrhage.   Assessment & Plan:   Principal Problem:   TIA (transient ischemic attack) Active Problems:   Hyperlipidemia, unspecified   Hypertension   Hypothyroidism, acquired   Type 2 diabetes mellitus (HCC)   Right hemiparesis (HCC)  # Ischemic CVA No hemorrhagic transformation. Symptoms are mild. Received asa 325 at home. CTA with carotid bulb plaque and vertebral artery plaque w/ severe stenosis - neuro consult - cont asa, further antiplatelet per neuro - vascular recs per neuro - lipid panel and a1c ordered - stop home simvastatin, start atorva 80 - head of bed to 30, swallow screen - f/u TTE - PT/OT consults - permissive htn  # T2DM On metformin at home. No hypoglycemia here - hold home metformin, monitor daily fasting sugar for now  # Hypothyroid - home levothyroxine  # HTN - holding home fosinopril for permissive htn   DVT prophylaxis: lovenox Code Status: full Family Communication: daughter (who is hcpoa) updated @ bedside  Level of care: Med-Surg Status is: inpt  The patient will require care spanning > 2 midnights and should be moved to inpatient because: Inpatient level of care appropriate due to severity of illness  Dispo: The patient is from: Home              Anticipated d/c is to:  tbd              Patient currently is not medically stable to d/c.   Difficult to place patient No        Consultants:  neurology  Procedures: none  Antimicrobials:  none    Subjective: This morning thinks speech and weakness improved but still subtly there. No swallow dysfunction. Otherwise feels well  Objective: Vitals:   10/20/20 0151  10/20/20 0344 10/20/20 0551 10/20/20 0809  BP:  (!) 188/56 (!) 152/43 (!) 174/59  Pulse:  64 63 75  Resp:  Temp:  97.9 F (36.6 C) 98.1 F (36.7 C) 97.8 F (36.6 C)  TempSrc:  Oral Oral Oral  SpO2:  96% 94% 95%  Weight: 58.7 kg     Height:       No intake or output data in the 24 hours ending 10/20/20 0813 Filed Weights   10/19/20 2054 10/20/20 0045 10/20/20 0151  Weight: 59.4 kg 58.7 kg 58.7 kg    Examination:  General exam: Appears calm and comfortable  Respiratory system: Clear to auscultation. Respiratory effort normal. Cardiovascular system: S1 & S2 heard, RRR. No JVD, murmurs, rubs, gallops or clicks. No pedal edema. Gastrointestinal system: Abdomen is nondistended, soft and nontender. No organomegaly or masses felt. Normal bowel sounds heard. Central nervous system: Alert and oriented. No focal neurological deficits. Extremities: Symmetric 5 x 5 power. Skin: No rashes, lesions or ulcers Psychiatry: Judgement and insight appear normal. Mood & affect appropriate.     Data Reviewed: I have personally reviewed following labs and imaging studies  CBC: Recent Labs  Lab 10/19/20 2101 10/20/20 0423  WBC 8.1 6.7  NEUTROABS 3.9  --   HGB 11.9* 10.7*  HCT 35.7* 32.9*  MCV 94.9 94.8  PLT 242 206   Basic Metabolic Panel: Recent Labs  Lab  10/19/20 2101 10/20/20 0423  NA 135 140  K 4.3 4.2  CL 107 111  CO2 20* 23  GLUCOSE 245* 144*  BUN 28* 26*  CREATININE 1.40* 1.34*  CALCIUM 8.9 9.0  MG 1.9 1.8   GFR: Estimated Creatinine Clearance: 23.1 mL/min (A) (by C-G formula based on SCr of 1.34 mg/dL (H)). Liver Function Tests: Recent Labs  Lab 10/19/20 2101 10/20/20 0423  AST 18 16  ALT 12 11  ALKPHOS 103 83  BILITOT 0.7 0.5  PROT 7.3 6.5  ALBUMIN 4.1 3.5   No results for input(s): LIPASE, AMYLASE in the last 168 hours. No results for input(s): AMMONIA in the last 168 hours. Coagulation Profile: Recent Labs  Lab 10/19/20 2101  INR 1.1    Cardiac Enzymes: No results for input(s): CKTOTAL, CKMB, CKMBINDEX, TROPONINI in the last 168 hours. BNP (last 3 results) No results for input(s): PROBNP in the last 8760 hours. HbA1C: No results for input(s): HGBA1C in the last 72 hours. CBG: Recent Labs  Lab 10/19/20 2242 10/20/20 0809  GLUCAP 167* 128*   Lipid Profile: Recent Labs    10/19/20 2101  CHOL 128  HDL 43  LDLCALC 43  TRIG 211*  CHOLHDL 3.0   Thyroid Function Tests: No results for input(s): TSH, T4TOTAL, FREET4, T3FREE, THYROIDAB in the last 72 hours. Anemia Panel: No results for input(s): VITAMINB12, FOLATE, FERRITIN, TIBC, IRON, RETICCTPCT in the last 72 hours. Urine analysis:    Component Value Date/Time   COLORURINE STRAW (A) 10/19/2020 2226   APPEARANCEUR CLEAR (A) 10/19/2020 2226   LABSPEC 1.009 10/19/2020 2226   PHURINE 5.0 10/19/2020 2226   GLUCOSEU NEGATIVE 10/19/2020 2226   HGBUR NEGATIVE 10/19/2020 2226   BILIRUBINUR NEGATIVE 10/19/2020 2226   KETONESUR NEGATIVE 10/19/2020 2226   PROTEINUR NEGATIVE 10/19/2020 2226   NITRITE NEGATIVE 10/19/2020 2226   LEUKOCYTESUR TRACE (A) 10/19/2020 2226   Sepsis Labs: @LABRCNTIP (procalcitonin:4,lacticidven:4)  ) Recent Results (from the past 240 hour(s))  Resp Panel by RT-PCR (Flu A&B, Covid) Nasopharyngeal Swab     Status: None   Collection Time: 10/19/20 10:26 PM   Specimen: Nasopharyngeal Swab; Nasopharyngeal(NP) swabs in vial transport medium  Result Value Ref Range Status   SARS Coronavirus 2 by RT PCR NEGATIVE NEGATIVE Final    Comment: (NOTE) SARS-CoV-2 target nucleic acids are NOT DETECTED.  The SARS-CoV-2 RNA is generally detectable in upper respiratory specimens during the acute phase of infection. The lowest concentration of SARS-CoV-2 viral copies this assay can detect is 138 copies/mL. A negative result does not preclude SARS-Cov-2 infection and should not be used as the sole basis for treatment or other patient management  decisions. A negative result may occur with  improper specimen collection/handling, submission of specimen other than nasopharyngeal swab, presence of viral mutation(s) within the areas targeted by this assay, and inadequate number of viral copies(<138 copies/mL). A negative result must be combined with clinical observations, patient history, and epidemiological information. The expected result is Negative.  Fact Sheet for Patients:  10/21/20  Fact Sheet for Healthcare Providers:  BloggerCourse.com  This test is no t yet approved or cleared by the [employer] FDA and  has been authorized for detection and/or diagnosis of SARS-CoV-2 by FDA under an Emergency Use Authorization (EUA). This EUA will remain  in effect (meaning this test can be used) for the duration of the COVID-19 declaration under Section 564(b)(1) of the Act, 21 U.S.C.section 360bbb-3(b)(1), unless the authorization is terminated  or revoked sooner.  Influenza A by PCR NEGATIVE NEGATIVE Final   Influenza B by PCR NEGATIVE NEGATIVE Final    Comment: (NOTE) The Xpert Xpress SARS-CoV-2/FLU/RSV plus assay is intended as an aid in the diagnosis of influenza from Nasopharyngeal swab specimens and should not be used as a sole basis for treatment. Nasal washings and aspirates are unacceptable for Xpert Xpress SARS-CoV-2/FLU/RSV testing.  Fact Sheet for Patients: BloggerCourse.com  Fact Sheet for Healthcare Providers: [employer]ious[employer]ker[employer]it  This test is not yet approved or cleared by the [place] FDA and has been authorized for detection and/or diagnosis of SARS-CoV-2 by FDA under an Emergency Use Authorization (EUA). This EUA will remain in effect (meaning this test can be used) for the duration of the COVID-19 declaration under Section 564(b)(1) of the Act, 21 U.S.C. section 360bbb-3(b)(1), unless the  authorization is terminated or revoked.  Performed at [facility], [address] [place], [place] [address]          Radiology Studies: CT ANGIO HEAD NECK W WO CM  Result Date: 10/19/2020 CLINICAL DATA:  Initial evaluation for acute right-sided weakness, speech difficulty. EXAM: CT ANGIOGRAPHY HEAD AND NECK TECHNIQUE: Multidetector CT imaging of the head and neck was performed using the standard protocol during bolus administration of intravenous contrast. Multiplanar CT image reconstructions and MIPs were obtained to evaluate the vascular anatomy. Carotid stenosis measurements (when applicable) are obtained utilizing NASCET criteria, using the distal internal carotid diameter as the denominator. CONTRAST:  28mL OMNIPAQUE IOHEXOL 350 MG/ML SOLN COMPARISON:  None. FINDINGS: CT HEAD FINDINGS Brain: Generalized age-related cerebral atrophy. Few small remote lacunar infarcts noted about the right basal ganglia and right thalamus. No acute intracranial hemorrhage. No acute large vessel territory infarct. No mass lesion, midline shift or mass effect. No hydrocephalus or extra-axial fluid collection. Vascular: No hyperdense vessel. Scattered vascular calcifications noted within the carotid siphons. Skull: Scalp soft tissues demonstrate no acute finding. Calvarium intact. Sinuses: Changes of chronic sinusitis noted. No active disease. No mastoid effusion. Orbits:  Globes orbital soft tissues demonstrate no acute finding. CTA NECK FINDINGS Aortic arch: Visualized aortic arch normal caliber with normal 3 vessel morphology. Moderate atheromatous change about the arch and origin of the great vessels without high-grade stenosis. Right carotid system: Right CCA patent from its origin to the bifurcation without stenosis. Eccentric mixed plaque at the right carotid bulb/proximal right ICA with associated mild 35% stenosis by NASCET criteria. Right ICA patent distally without stenosis, dissection or  occlusion. Left carotid system: The CCA patent from its origin to the bifurcation without stenosis. Bulky calcified plaque about the left carotid bulb with associated stenosis of up to 70% by NASCET criteria. Left ICA patent distally without stenosis, dissection or occlusion. Vertebral arteries: Both vertebral arteries arise from subclavian arteries. No significant proximal subclavian artery stenosis. Strongly dominant left vertebral artery with a diffusely hypoplastic right vertebral artery. Atheromatous plaque at the origin of the dominant left vertebral artery with associated severe stenosis (series 10, image 77). Dominant left vertebral artery otherwise patent within the neck. Focal severe distal right V3 stenosis noted just prior to cranial vault (series 10, image 196). Skeleton: No visible acute osseous finding. No discrete or worrisome osseous lesions. Other neck: No other acute soft tissue abnormality within the neck. No mass or adenopathy. Upper chest: Scattered interlobular septal thickening noted within the visualized lungs, suggesting mild pulmonary interstitial edema. Mildly prominent precarinal node measures up to the upper limits of normal at 1 cm. Visualized upper chest demonstrates no other  acute finding. Review of the MIP images confirms the above findings CTA HEAD FINDINGS Anterior circulation: Petrous segments patent bilaterally. Scattered atheromatous change within the carotid siphons without high-grade stenosis. A1 segments patent bilaterally. Normal anterior communicating artery complex. Anterior cerebral arteries patent to their distal aspects without stenosis. No M1 stenosis or occlusion. Normal MCA bifurcations. Distal MCA branches well perfused and symmetric. Posterior circulation: Dominant left vertebral artery widely patent to the vertebrobasilar junction. Right vertebral artery markedly hypoplastic and irregular, but is grossly patent to the vertebrobasilar junction as well. Right PICA  origin patent. Left PICA not seen. Basilar patent to its distal aspect without stenosis. Superior cerebral arteries patent bilaterally. Left PCA supplied via the basilar and is widely patent to its distal aspect. Fetal type origin of the right PCA. Atheromatous change throughout the right PCA with associated moderate multifocal P2/P3 stenoses. Venous sinuses: Grossly patent allowing for timing the contrast bolus. Anatomic variants: Fetal type origin of the right PCA.  No aneurysm. Review of the MIP images confirms the above findings IMPRESSION: CT HEAD IMPRESSION: 1. No acute intracranial abnormality. 2. Few small remote lacunar infarcts about the right basal ganglia and thalamus. CTA HEAD AND NECK IMPRESSION: 1. Negative CTA for emergent large vessel occlusion. 2. Bulky calcified plaque about the left carotid bulb with associated stenosis of up to 70% by NASCET criteria. 3. Atheromatous plaque at the origin of the dominant left vertebral artery with associated severe stenosis. Strongly dominant left vertebral artery otherwise patent within the neck. 4. Atheromatous change throughout the right PCA with associated moderate multifocal P2/P3 stenoses. 5. Mild pulmonary interstitial edema. Electronically Signed   By: Rise MuBenjamin  McClintock M.D.   On: 10/19/2020 22:55   MR BRAIN WO CONTRAST  Result Date: 10/20/2020 CLINICAL DATA:  Initial evaluation for neuro deficit, stroke suspected. Acute right-sided weakness. EXAM: MRI HEAD WITHOUT CONTRAST TECHNIQUE: Multiplanar, multiecho pulse sequences of the brain and surrounding structures were obtained without intravenous contrast. COMPARISON:  Prior CTA from 10/19/2020. FINDINGS: Brain: Mild diffuse prominence of the CSF containing spaces compatible generalized cerebral atrophy. Scattered patchy T2/FLAIR hyperintensity within the periventricular and deep white matter both cerebral hemispheres as well as the pons, most consistent with chronic small vessel ischemic disease,  mild for age. Few scattered remote lacunar infarcts present about the basal ganglia and thalami. There is a subtle punctate 5 mm focus of diffusion signal abnormality involving the left basal ganglia, only seen on coronal DWI sequence (series 7, image 18). Given the patient history of right-sided weakness, findings suspicious for a tiny punctate ischemic infarct. No associated hemorrhage or mass effect. No other diffusion abnormality to suggest acute or subacute ischemia. Gray-white matter differentiation otherwise maintained. No other areas of remote cortical infarction. No other evidence for acute or chronic intracranial hemorrhage. No mass lesion, midline shift or mass effect. No hydrocephalus or extra-axial fluid collection. Pituitary gland suprasellar region within normal limits. Midline structures intact. Vascular: Major intracranial vascular flow voids are maintained. Skull and upper cervical spine: Craniocervical junction within normal limits. Bone marrow signal intensity normal. No scalp soft tissue abnormality. Sinuses/Orbits: Patient status post bilateral ocular lens replacement. Globes and orbital soft tissues within normal limits. Paranasal sinuses are largely clear. No significant mastoid effusion. Inner ear structures grossly normal. Other: None. IMPRESSION: 1. Subtle 5 mm focus of diffusion signal abnormality involving the left basal ganglia, suspicious for a tiny ischemic infarct given the patient history of right-sided weakness. No associated hemorrhage or mass effect. 2. No other acute intracranial  abnormality. 3. Mild chronic microvascular ischemic disease with a few scattered remote lacunar infarcts about the basal ganglia and thalami. Electronically Signed   By: Rise Mu M.D.   On: 10/20/2020 05:49        Scheduled Meds:  aspirin  81 mg Oral Daily   insulin aspart  0-5 Units Subcutaneous QHS   insulin aspart  0-9 Units Subcutaneous TID WC   levothyroxine  88 mcg Oral Q0600    simvastatin  20 mg Oral Daily   Continuous Infusions:   LOS: 0 days    Time spent: 40 min    Silvano Bilis, MD [facility]   If 7PM-7AM, please contact night-coverage [email or web address] Password [facility] 10/20/2020, 8:13 AM

## 2020-10-20 NOTE — Progress Notes (Signed)
*  PRELIMINARY RESULTS* Echocardiogram 2D Echocardiogram has been performed.  Cristela Blue 10/20/2020, 1:00 PM

## 2020-10-20 NOTE — Progress Notes (Signed)
   10/20/20 0147  Assess: MEWS Score  Temp 97.9 F (36.6 C)  BP (!) 217/62  Pulse Rate 69  ECG Heart Rate 72  Resp 15  SpO2 96 %  O2 Device Room Air  Assess: MEWS Score  MEWS Temp 0  MEWS Systolic 2  MEWS Pulse 0  MEWS RR 0  MEWS LOC 0  MEWS Score 2  MEWS Score Color Yellow  Assess: if the MEWS score is Yellow or Red  Were vital signs taken at a resting state? Yes  Focused Assessment Change from prior assessment (see assessment flowsheet)  Does the patient meet 2 or more of the SIRS criteria? No  MEWS guidelines implemented *See Row Information* Yes  Treat  MEWS Interventions Escalated (See documentation below)  Pain Scale 0-10  Pain Score 0  Take Vital Signs  Increase Vital Sign Frequency  Yellow: Q 2hr X 2 then Q 4hr X 2, if remains yellow, continue Q 4hrs  Escalate  MEWS: Escalate Yellow: discuss with charge nurse/RN and consider discussing with provider and RRT  Notify: Charge Nurse/RN  Name of Charge Nurse/RN Notified Lady Deutscher RN  Date Charge Nurse/RN Notified 10/20/20  Time Charge Nurse/RN Notified 0151  Document  Patient Outcome Other (Comment) (continue to monitor)  Progress note created (see row info) Yes  Assess: SIRS CRITERIA  SIRS Temperature  0  SIRS Pulse 0  SIRS Respirations  0  SIRS WBC 0  SIRS Score Sum  0

## 2020-10-20 NOTE — Consult Note (Addendum)
NEURO HOSPITALIST CONSULT NOTE   Requestig physician: Dr. Ashok Pall  Reason for Consult:Right sided weakness and word finding difficulty  History obtained from:  Patient, Daughter and Chart     HPI:                                                                                                                                          Cassandra Schaefer is an 87 y.o. female with a PMHx of anemia, DM, HTN, hypothyroidism, gout, CKD stage III and PUD who presented to the [facility] ED yesterday evening with chief complaints of right sided weakness, right arm numbness, difficulty ambulating and word finding difficulty. No associated dizziness or headache. Her symptoms began at 1230 on Sunday after getting home from church. Her family gave her an aspirin tablet at that time. BP was elevated in the ED at 171/96 -196/67. She has no prior history of stroke. She was outside of the tPA window at the time of presentation.   CT head showed no acute abnormality.   CTA of head and neck showed no LVO. Atherosclerotic changes were noted in the intracranial circulation as well as the neck, including bulky calcified plaque about the left carotid bulb with associated stenosis of up to 70% by NASCET criteria.  EKG: NSR  Past Medical History:  Diagnosis Date   Anemia    Cough    RESOLVING, NO FEVER   Diabetes mellitus without complication (HCC)    Gout    Hypertension    Hypothyroidism    PUD (peptic ulcer disease)    Wears dentures    full upper, partial lower    Past Surgical History:  Procedure Laterality Date   AMPUTATION TOE Left 12/14/2017   Procedure: AMPUTATION TOE-4TH MPJ;  Surgeon: Gwyneth Revels, DPM;  Location: [facility];  Service: Podiatry;  Laterality: Left;  IVA LOCAL Diabetic - oral meds   APPENDECTOMY     CATARACT EXTRACTION W/PHACO Right 06/23/2015   Procedure: CATARACT EXTRACTION PHACO AND INTRAOCULAR LENS PLACEMENT (IOC);  Surgeon: Sallee Lange, MD;   Location: [facility];  Service: Ophthalmology;  Laterality: Right;  [place] 01:28 AP% 23.4 CDE 36.14 fluid pack lot # [id number]   CATARACT EXTRACTION W/PHACO Left 07/28/2015   Procedure: CATARACT EXTRACTION PHACO AND INTRAOCULAR LENS PLACEMENT (IOC);  Surgeon: Sallee Lange, MD;  Location: [facility];  Service: Ophthalmology;  Laterality: Left;  [place]    1:29.7 AP%  24.9 CDE   41.32 fluid casette lot #[id number] H exp 09/23/2016   COONOSCOPY     AND ENDOSCOPY   DILATION AND CURETTAGE OF UTERUS     TONSILLECTOMY     TUBAL LIGATION      Family History  Problem Relation Age of Onset   Breast cancer Neg Hx  Social History:  reports that she quit smoking about 36 years ago. Her smoking use included cigarettes. She has never used smokeless tobacco. She reports that she does not drink alcohol. No history on file for drug use.  Allergies  Allergen Reactions   Penicillins Hives   Sulfa Antibiotics Hives   Tetanus Toxoids Swelling   Procaine Other (See Comments)    "went into shock"   Tuberculin Tests Swelling and Rash    MEDICATIONS:                                                                                                                     Prior to Admission:  Medications Prior to Admission  Medication Sig Dispense Refill Last Dose   fosinopril (MONOPRIL) 10 MG tablet Take 10 mg by mouth at bedtime.      glucose blood (ACCU-CHEK COMPACT PLUS) test strip USE TO TEST BLOOD SUGAR DAILY. DX CODE: E11.9      HYDROcodone-acetaminophen (NORCO) 5-325 MG tablet Take 1 tablet by mouth every 6 (six) hours as needed for moderate pain. 20 tablet 0    iron polysaccharides (FERREX 150) 150 MG capsule Take by mouth.      levothyroxine (SYNTHROID, LEVOTHROID) 88 MCG tablet Take 88 mcg by mouth daily before breakfast.      metFORMIN (GLUCOPHAGE-XR) 500 MG 24 hr tablet Take 500 mg by mouth 2 (two) times daily. 2 PILLS AM   1 PILL PM (12/06/17 - only takes 1 pill PM)      Multiple Vitamins-Minerals  (ICAPS AREDS 2 PO) Take by mouth 2 (two) times daily. Reported on 06/23/2015      simvastatin (ZOCOR) 20 MG tablet Take 20 mg by mouth daily.      vitamin B-12 (CYANOCOBALAMIN) 1000 MCG tablet Take 2,000 mcg by mouth daily.       Scheduled:  aspirin  81 mg Oral Daily   atorvastatin  80 mg Oral Daily   enoxaparin (LOVENOX) injection  30 mg Subcutaneous Q24H   insulin aspart  0-5 Units Subcutaneous QHS   levothyroxine  88 mcg Oral Q0600   Continuous:   ROS:                                                                                                                                       No chest pain, SOB or headache at the time of presentation. No vision changes or  facial droop. No recent falls. No cough, N/V or dysuria. Other ROS as per HPI.    Blood pressure (!) 174/59, pulse 75, temperature 97.8 F (36.6 C), temperature source Oral, resp. rate 20, height 5\' 3"  (1.6 m), weight 58.7 kg, SpO2 95 %.   General Examination:                                                                                                       Physical Exam  HEENT-  [place]/AT  Lungs- Respirations unlabored Extremities- No edema  Neurological Examination Mental Status: Awake and alert. Speech with mild to moderate dysarthria that has a scanning/cerebellar quality. Speech otherwise is fluent with intact naming and comprehension. Oriented x 5. Good insight.  Cranial Nerves: II: Temporal visual fields intact with no extinction to DSS. PERRL.  III,IV, VI: No ptosis. EOMI.  V: Temp sensation equal bilaterally VII: Subtly decreased right NL fold.  VIII: Hearing intact to conversation.  IX,X: No hypophonia XI: Head is midline XII: Midline tongue extension Motor: RUE 4/5 proximally and distally RLE 5/5 proximally and distally LUE and LLE 5/5 Sensory: Temp and light touch sensation is equal to BUE and BLE. No extinction to DSS.  Deep Tendon Reflexes: 1+ bilateral brachioradialis and biceps. Trace patellae. 0  achilles bilaterally.  Plantars: Right: Upgoing   Left: Equivocal Cerebellar: FNF intact bilaterally.  Gait: Deferred   Lab Results: Basic Metabolic Panel: Recent Labs  Lab 10/19/20 2101 10/20/20 0423  NA 135 140  K 4.3 4.2  CL 107 111  CO2 20* 23  GLUCOSE 245* 144*  BUN 28* 26*  CREATININE 1.40* 1.34*  CALCIUM 8.9 9.0  MG 1.9 1.8    CBC: Recent Labs  Lab 10/19/20 2101 10/20/20 0423  WBC 8.1 6.7  NEUTROABS 3.9  --   HGB 11.9* 10.7*  HCT 35.7* 32.9*  MCV 94.9 94.8  PLT 242 206    Cardiac Enzymes: No results for input(s): CKTOTAL, CKMB, CKMBINDEX, TROPONINI in the last 168 hours.  Lipid Panel: Recent Labs  Lab 10/19/20 2101  CHOL 128  TRIG 211*  HDL 43  CHOLHDL 3.0  VLDL 42*  LDLCALC 43    Imaging: CT ANGIO HEAD NECK W WO CM  Result Date: 10/19/2020 CLINICAL DATA:  Initial evaluation for acute right-sided weakness, speech difficulty. EXAM: CT ANGIOGRAPHY HEAD AND NECK TECHNIQUE: Multidetector CT imaging of the head and neck was performed using the standard protocol during bolus administration of intravenous contrast. Multiplanar CT image reconstructions and MIPs were obtained to evaluate the vascular anatomy. Carotid stenosis measurements (when applicable) are obtained utilizing NASCET criteria, using the distal internal carotid diameter as the denominator. CONTRAST:  50mL OMNIPAQUE IOHEXOL 350 MG/ML SOLN COMPARISON:  None. FINDINGS: CT HEAD FINDINGS Brain: Generalized age-related cerebral atrophy. Few small remote lacunar infarcts noted about the right basal ganglia and right thalamus. No acute intracranial hemorrhage. No acute large vessel territory infarct. No mass lesion, midline shift or mass effect. No hydrocephalus or extra-axial fluid collection. Vascular: No hyperdense vessel. Scattered vascular calcifications noted within the carotid siphons. Skull: Scalp  soft tissues demonstrate no acute finding. Calvarium intact. Sinuses: Changes of chronic sinusitis  noted. No active disease. No mastoid effusion. Orbits:  Globes orbital soft tissues demonstrate no acute finding. CTA NECK FINDINGS Aortic arch: Visualized aortic arch normal caliber with normal 3 vessel morphology. Moderate atheromatous change about the arch and origin of the great vessels without high-grade stenosis. Right carotid system: Right CCA patent from its origin to the bifurcation without stenosis. Eccentric mixed plaque at the right carotid bulb/proximal right ICA with associated mild 35% stenosis by NASCET criteria. Right ICA patent distally without stenosis, dissection or occlusion. Left carotid system: The CCA patent from its origin to the bifurcation without stenosis. Bulky calcified plaque about the left carotid bulb with associated stenosis of up to 70% by NASCET criteria. Left ICA patent distally without stenosis, dissection or occlusion. Vertebral arteries: Both vertebral arteries arise from subclavian arteries. No significant proximal subclavian artery stenosis. Strongly dominant left vertebral artery with a diffusely hypoplastic right vertebral artery. Atheromatous plaque at the origin of the dominant left vertebral artery with associated severe stenosis (series 10, image 77). Dominant left vertebral artery otherwise patent within the neck. Focal severe distal right V3 stenosis noted just prior to cranial vault (series 10, image 196). Skeleton: No visible acute osseous finding. No discrete or worrisome osseous lesions. Other neck: No other acute soft tissue abnormality within the neck. No mass or adenopathy. Upper chest: Scattered interlobular septal thickening noted within the visualized lungs, suggesting mild pulmonary interstitial edema. Mildly prominent precarinal node measures up to the upper limits of normal at 1 cm. Visualized upper chest demonstrates no other acute finding. Review of the MIP images confirms the above findings CTA HEAD FINDINGS Anterior circulation: Petrous segments  patent bilaterally. Scattered atheromatous change within the carotid siphons without high-grade stenosis. A1 segments patent bilaterally. Normal anterior communicating artery complex. Anterior cerebral arteries patent to their distal aspects without stenosis. No M1 stenosis or occlusion. Normal MCA bifurcations. Distal MCA branches well perfused and symmetric. Posterior circulation: Dominant left vertebral artery widely patent to the vertebrobasilar junction. Right vertebral artery markedly hypoplastic and irregular, but is grossly patent to the vertebrobasilar junction as well. Right PICA origin patent. Left PICA not seen. Basilar patent to its distal aspect without stenosis. Superior cerebral arteries patent bilaterally. Left PCA supplied via the basilar and is widely patent to its distal aspect. Fetal type origin of the right PCA. Atheromatous change throughout the right PCA with associated moderate multifocal P2/P3 stenoses. Venous sinuses: Grossly patent allowing for timing the contrast bolus. Anatomic variants: Fetal type origin of the right PCA.  No aneurysm. Review of the MIP images confirms the above findings IMPRESSION: CT HEAD IMPRESSION: 1. No acute intracranial abnormality. 2. Few small remote lacunar infarcts about the right basal ganglia and thalamus. CTA HEAD AND NECK IMPRESSION: 1. Negative CTA for emergent large vessel occlusion. 2. Bulky calcified plaque about the left carotid bulb with associated stenosis of up to 70% by NASCET criteria. 3. Atheromatous plaque at the origin of the dominant left vertebral artery with associated severe stenosis. Strongly dominant left vertebral artery otherwise patent within the neck. 4. Atheromatous change throughout the right PCA with associated moderate multifocal P2/P3 stenoses. 5. Mild pulmonary interstitial edema. Electronically Signed   By: Rise Mu M.D.   On: 10/19/2020 22:55   MR BRAIN WO CONTRAST  Result Date: 10/20/2020 CLINICAL DATA:   Initial evaluation for neuro deficit, stroke suspected. Acute right-sided weakness. EXAM: MRI HEAD WITHOUT CONTRAST TECHNIQUE: Multiplanar,  multiecho pulse sequences of the brain and surrounding structures were obtained without intravenous contrast. COMPARISON:  Prior CTA from 10/19/2020. FINDINGS: Brain: Mild diffuse prominence of the CSF containing spaces compatible generalized cerebral atrophy. Scattered patchy T2/FLAIR hyperintensity within the periventricular and deep white matter both cerebral hemispheres as well as the pons, most consistent with chronic small vessel ischemic disease, mild for age. Few scattered remote lacunar infarcts present about the basal ganglia and thalami. There is a subtle punctate 5 mm focus of diffusion signal abnormality involving the left basal ganglia, only seen on coronal DWI sequence (series 7, image 18). Given the patient history of right-sided weakness, findings suspicious for a tiny punctate ischemic infarct. No associated hemorrhage or mass effect. No other diffusion abnormality to suggest acute or subacute ischemia. Gray-white matter differentiation otherwise maintained. No other areas of remote cortical infarction. No other evidence for acute or chronic intracranial hemorrhage. No mass lesion, midline shift or mass effect. No hydrocephalus or extra-axial fluid collection. Pituitary gland suprasellar region within normal limits. Midline structures intact. Vascular: Major intracranial vascular flow voids are maintained. Skull and upper cervical spine: Craniocervical junction within normal limits. Bone marrow signal intensity normal. No scalp soft tissue abnormality. Sinuses/Orbits: Patient status post bilateral ocular lens replacement. Globes and orbital soft tissues within normal limits. Paranasal sinuses are largely clear. No significant mastoid effusion. Inner ear structures grossly normal. Other: None. IMPRESSION: 1. Subtle 5 mm focus of diffusion signal abnormality  involving the left basal ganglia, suspicious for a tiny ischemic infarct given the patient history of right-sided weakness. No associated hemorrhage or mass effect. 2. No other acute intracranial abnormality. 3. Mild chronic microvascular ischemic disease with a few scattered remote lacunar infarcts about the basal ganglia and thalami. Electronically Signed   By: Rise MuBenjamin  McClintock M.D.   On: 10/20/2020 05:49     Assessment: [age] y.o. female with a PMHx of anemia, DM, HTN, hypothyroidism, gout, CKD stage III and PUD who [facility] evening with chief complaints of right sided weakness, right arm numbness, difficulty ambulating and word finding difficulty. No associated dizziness or headache. Her symptoms began at 1230 on Sunday. She has no prior history of stroke. She was outside of the tPA window at the time of presentation.  1. Exam reveals mild to moderate dysarthria, subtle right facial weakness and RUE mild weakness.  2. CT head showed no acute abnormality. Few small remote lacunar infarcts about the right basal ganglia and thalamus. 3. CTA of head and neck showed no LVO. Atherosclerotic changes were noted in the intracranial circulation as well as the neck, including bulky calcified plaque about the left carotid bulb with associated stenosis of up to 70% by NASCET criteria. Atheromatous plaque at the origin of the dominant left vertebral artery with associated severe stenosis. Strongly dominant left vertebral artery otherwise patent within the neck. Atheromatous change throughout the right PCA with associated moderate multifocal P2/P3 stenoses. 4. MRI brain: Subtle 5 mm focus of diffusion signal abnormality involving the left basal ganglia, suspicious for a tiny ischemic infarct given the patient history of right-sided weakness. Mild chronic microvascular ischemic disease with a few scattered remote lacunar infarcts about the basal ganglia and thalami 5. EKG: NSR 6. Given the  symptomatic left ICA stenosis, she is a possible candidate for CEA.   Recommendations: 1. HgbA1c, fasting lipid panel 2. ASA 81 mg po qd 3. PT consult, OT consult, Speech consult 4. TTE with bubble study 5. Continue statin 6. Frequent  neuro checks 7. Risk factor modification 8. Telemetry monitoring. If no evidence for atrial fibrillation/flutter, consider 30-day cardiac event monitor at time of discharge for further assessment.  9. Carotid ultrasound followed by Vascular Surgery consult.  10. Now out of the permissive HTN time window. BP management as per standard protocol.    Addendum: TTE reveals normal left venticular function with EF of 55 to 60%. No evidence of any interatrial shunt.   Electronically signed: Dr. Caryl Pina 10/20/2020, 8:23 AM

## 2020-10-20 NOTE — Evaluation (Signed)
Physical Therapy Evaluation Patient Details Name: Cassandra Schaefer MRN: [id number] DOB: 1928-11-27 Today's Date: 10/20/2020   History of Present Illness  87 y.o. female with medical history significant for type 2 diabetes mellitus, essential hypertension, acquired hypothyroidism, hyperlipidemia, stage III chronic kidney disease with baseline creatinine 1.4-1.7, who is admitted to [facility] on 10/19/2020 with suspected TIA versus acute ischemic CVA after presenting from home to [facility] ED complaining of right-sided weakness. Confirmed L basal ganglia ischemic stroke.  Clinical Impression  Patient resting in bed upon arrival to room; alert and oriented, follows commands and agreeable to participation with session. Daughter at bedside, supportive and encouraging to patient.  Patient endorses persistent weakness in R UE > LE and mild slurring of speech, but notes marked improvement since initial symptoms and admission.  Mild weakness noted R UE (4-/5) and R LE (4/5) with isolated testing; mild paresthesia fingertips of bilat hands (baseline neuropathy, unchanged with this admission).  Additionall, mild weakness of R lateral trunk musculature (resulting in excessive R lateral weight shift with all sitting and standing postures; worsens with fatigue.).  Noted deficits in standing balance activities (BERG 14/56), indicative of very high fall risk.   Currently requiring cga/close sup for bed mobility; min assist for sit/stand, basic transfers and gait (15') without assist device.  Demonstrates very stiff, guarded gait performance; very deliberate stepping wtih poor step height/length bilat.  Mild R lateral lean/weight shift throughout, resulting in difficulty unweighting/advancing R LE (improved with manual faciliation for L ant/lateral weight shift) Additional gait trial (100') with RW, cga/min assist-improved weight shift and overall gait mechanics, symmetry.  Do recommend continued use of RW  for all mobility at this time (educated in recs for RW instead of [id number]).  Patient/daughter voiced understanding and agreement. Would benefit from skilled PT to address above deficits and promote optimal return to PLOF.; Recommend transition to HHPT upon discharge from acute hospitalization.     Follow Up Recommendations Home health PT    Equipment Recommendations  Rolling walker with 5" wheels    Recommendations for Other Services       Precautions / Restrictions Precautions Precautions: Fall Restrictions Weight Bearing Restrictions: No      Mobility  Bed Mobility Overal bed mobility: Needs Assistance Bed Mobility: Supine to Sit     Supine to sit: Min guard     General bed mobility comments: increased time, heavy use of bedrail    Transfers Overall transfer level: Needs assistance Equipment used: None Transfers: Sit to/from Stand Sit to Stand: Min assist         General transfer comment: generally cautious and effortful with movement transition; does rely on UEs for support.  Decreased lumbar extension noted with movement transitions, resulting in decreased eccentric control with stand to sit  Ambulation/Gait Ambulation/Gait assistance: Min assist Gait Distance (Feet): 15 Feet Assistive device: None       General Gait Details: very stiff, guarded gait performance; very deliberate stepping wtih poor step height/length bilat.  Mild R lateral lean/weight shift throughout, resulting in difficulty unweighting/advancing R LE (improved with manual faciliation for L ant/lateral weight shift)  Stairs            Wheelchair Mobility    Modified Rankin (Stroke Patients Only)       Balance Overall balance assessment: Needs assistance Sitting-balance support: No upper extremity supported;Feet supported Sitting balance-Leahy Scale: Good     Standing balance support: No upper extremity supported Standing balance-Leahy Scale: Poor  Standardized Balance Assessment Standardized Balance Assessment : Berg Balance Test Berg Balance Test Sit to Stand: Able to stand using hands after several tries Standing Unsupported: Needs several tries to stand 30 seconds unsupported Sitting with Back Unsupported but Feet Supported on Floor or Stool: Able to sit 2 minutes under supervision Stand to Sit: Controls descent by using hands Transfers: Needs one person to assist Standing Unsupported with Eyes Closed: Needs help to keep from falling Standing Ubsupported with Feet Together: Needs help to attain position and unable to hold for 15 seconds From Standing, Reach Forward with Outstretched Arm: Reaches forward but needs supervision From Standing Position, Pick up Object from Floor: Unable to pick up and needs supervision From Standing Position, Turn to Look Behind Over each Shoulder: Needs supervision when turning Turn 360 Degrees: Needs assistance while turning Standing Unsupported, Alternately Place Feet on Step/Stool: Needs assistance to keep from falling or unable to try Standing Unsupported, One Foot in Front: Needs help to step but can hold 15 seconds Standing on One Leg: Unable to try or needs assist to prevent fall Total Score: 14         Pertinent Vitals/Pain Pain Assessment: No/denies pain    Home Living Family/patient expects to be discharged to:: Private residence Living Arrangements: Children Available Help at Discharge: Family;Available 24 hours/day Type of Home: House Home Access: Ramped entrance     Home Layout: One level Home Equipment: Cane - single point      Prior Function Level of Independence: Independent with assistive device(s)         Comments: Pt reports being I with self care tasks, mobility, and IADL tasks. Pt uses SPC for community mobility but not within the home.  Active, enjoys yoga/exercise at senior center 1-2x/week     Hand Dominance   Dominant Hand: Right    Extremity/Trunk  Assessment   Upper Extremity Assessment Upper Extremity Assessment:  (R UE grossly 4-/5; mild paresthesia fingertips of bilat hands (baseline neuropathy), unchanged with this admission)    Lower Extremity Assessment Lower Extremity Assessment:  (R LE grossly 4/5, denies numbness/tingling)    Cervical / Trunk Assessment Cervical / Trunk Assessment:  (mild weakness R lateral trunk)  Communication   Communication:  (mild dysathria and word-finding difficulty; markedly improved since admission per patient)  Cognition Arousal/Alertness: Awake/alert Behavior During Therapy: WFL for tasks assessed/performed Overall Cognitive Status: Within Functional Limits for tasks assessed                                        General Comments      Exercises Other Exercises Other Exercises: 100' with RW, cga/min assist-improved weight shift and overall gait mechanics, symmetry.  Do recommend continued use of RW for all mobility at this time (educated in recs for RW instead of 4WRW) Other Exercises: Verbally reviewed signs/symptoms of fatigue given new diagnosis/deficits and importance of activity pacing, energy conservation to optimize safety/indep with functional tasks.  Patient voiced understanding and agreement.   Assessment/Plan    PT Assessment Patient needs continued PT services  PT Problem List Decreased strength;Decreased range of motion;Decreased activity tolerance;Decreased balance;Decreased mobility;Decreased safety awareness;Decreased knowledge of use of DME;Decreased coordination;Cardiopulmonary status limiting activity       PT Treatment Interventions DME instruction;Gait training;Stair training;Functional mobility training;Therapeutic activities;Patient/family education;Cognitive remediation;Neuromuscular re-education;Balance training;Therapeutic exercise    PT Goals (Current goals can be found in the Care Plan  section)  Acute Rehab PT Goals Patient Stated Goal: to  get better and go home PT Goal Formulation: With patient/family Time For Goal Achievement: 11/03/20 Potential to Achieve Goals: Good    Frequency 7X/week   Barriers to discharge        Co-evaluation               AM-PAC PT "6 Clicks" Mobility  Outcome Measure Help needed turning from your back to your side while in a flat bed without using bedrails?: None Help needed moving from lying on your back to sitting on the side of a flat bed without using bedrails?: A Little Help needed moving to and from a bed to a chair (including a wheelchair)?: A Little   Help needed to walk in hospital room?: A Little Help needed climbing 3-5 steps with a railing? : A Lot 6 Click Score: 15    End of Session Equipment Utilized During Treatment: Gait belt Activity Tolerance: Patient tolerated treatment well Patient left: in bed;with bed alarm set;with call bell/phone within reach Nurse Communication: Mobility status PT Visit Diagnosis: Muscle weakness (generalized) (M62.81);Difficulty in walking, not elsewhere classified (R26.2)    Time: 0998-3382 PT Time Calculation (min) (ACUTE ONLY): 34 min   Charges:   PT Evaluation $PT Eval Moderate Complexity: 1 Mod PT Treatments $Gait Training: 8-22 mins $Therapeutic Activity: 8-22 mins       Melayna Robarts H. Manson Passey, PT, DPT, NCS 10/20/20, 11:12 PM [phone]

## 2020-10-20 NOTE — Evaluation (Signed)
Occupational Therapy Evaluation Patient Details Name: Cassandra Schaefer MRN: [id number] DOB: 1928-09-03 Today's Date: 10/20/2020    History of Present Illness 87 y.o. female with medical history significant for type 2 diabetes mellitus, essential hypertension, acquired hypothyroidism, hyperlipidemia, stage III chronic kidney disease with baseline creatinine 1.4-1.7, who is admitted to [facility] on 10/19/2020 with suspected TIA versus acute ischemic CVA after presenting from home to [facility] ED complaining of right-sided weakness. Confirmed L basal ganglia ischemic stroke.   Clinical Impression   Patient presenting with deceased I in self care, balance, functional mobility/transfers, endurance, and safety awareness. Pt reports living with family at baseline and independent within the home. Pt uses SPC within the community for longer distances. Pt goes to senior center 2x/wk for exercise and yoga.Today pt needing min A to EOB and ambulating with min A without use of AD with very short shuffling steps. No buckling notes. Pt able to utilize UE functionally for self care tasks in standing but needing increased time secondary to decreased coordination. Pt fatigues very quickly this session. Patient will benefit from acute OT to increase overall independence in the areas of ADLs, functional mobility, and safety awareness in order to safely discharge home with family.    Follow Up Recommendations  Home health OT;Supervision - Intermittent    Equipment Recommendations  3 in 1 bedside commode       Precautions / Restrictions Precautions Precautions: Fall      Mobility Bed Mobility Overal bed mobility: Needs Assistance Bed Mobility: Supine to Sit;Sit to Supine     Supine to sit: Min assist Sit to supine: Min assist   General bed mobility comments: min cuing for technique and min A for trunk support    Transfers Overall transfer level: Needs assistance Equipment used: 1  person hand held assist Transfers: Stand Pivot Transfers;Sit to/from Stand Sit to Stand: Min assist Stand pivot transfers: Min assist       General transfer comment: small shuffling steps with min HHA to sink. Pt needing close supervision - min guard for standing balance at sink    Balance Overall balance assessment: Needs assistance Sitting-balance support: Feet supported Sitting balance-Leahy Scale: Fair     Standing balance support: During functional activity Standing balance-Leahy Scale: Poor Standing balance comment: assist from therapist                           ADL either performed or assessed with clinical judgement   ADL Overall ADL's : Needs assistance/impaired                                       General ADL Comments: supervision - min A for functional mobility nd self care tasks without use of AD.     Vision Baseline Vision/History: Wears glasses Wears Glasses: At all times Patient Visual Report: No change from baseline              Pertinent Vitals/Pain Pain Assessment: No/denies pain     Hand Dominance Right   Extremity/Trunk Assessment Upper Extremity Assessment Upper Extremity Assessment: RUE deficits/detail RUE Deficits / Details: 3+/5 gross strength RUE Sensation: history of peripheral neuropathy RUE Coordination: decreased fine motor;decreased gross motor   Lower Extremity Assessment Lower Extremity Assessment: Defer to PT evaluation       Communication Communication Communication:  (dysarthria)   Cognition Arousal/Alertness:  Awake/alert Behavior During Therapy: WFL for tasks assessed/performed Overall Cognitive Status: Within Functional Limits for tasks assessed                                 General Comments: A & O x4. Pt is given BIMS and able to recall 3/3 after 5 minutes without cuing.              Home Living Family/patient expects to be discharged to:: Private residence Living  Arrangements: Children (lives with daughter) Available Help at Discharge: Family;Available 24 hours/day Type of Home: House       Home Layout: One level     Bathroom Shower/Tub: Tub/shower unit         Home Equipment: Cane - single point          Prior Functioning/Environment Level of Independence: Independent with assistive device(s)        Comments: Pt reports being I with self care tasks, mobility, and IADL tasks. Pt uses SPC for community mobility but not within the home.        OT Problem List: Decreased strength;Decreased activity tolerance;Impaired balance (sitting and/or standing);Decreased safety awareness;Decreased knowledge of use of DME or AE;Decreased range of motion;Decreased coordination;Impaired sensation      OT Treatment/Interventions: Self-care/ADL training;Therapeutic exercise;Energy conservation;DME and/or AE instruction;Therapeutic activities;Balance training;Patient/family education;Manual therapy    OT Goals(Current goals can be found in the care plan section) Acute Rehab OT Goals Patient Stated Goal: to get better and go home OT Goal Formulation: With patient/family Time For Goal Achievement: 11/03/20 Potential to Achieve Goals: Good ADL Goals Pt Will Perform Grooming: with modified independence;standing Pt Will Perform Lower Body Dressing: with modified independence;sit to/from stand Pt Will Transfer to Toilet: with modified independence;ambulating Pt Will Perform Toileting - Clothing Manipulation and hygiene: with modified independence;sit to/from stand  OT Frequency: Min 2X/week   Barriers to D/C:    none known at this time          AM-PAC OT "6 Clicks" Daily Activity     Outcome Measure Help from another person eating meals?: None Help from another person taking care of personal grooming?: A Little Help from another person toileting, which includes using toliet, bedpan, or urinal?: A Little Help from another person bathing  (including washing, rinsing, drying)?: A Little Help from another person to put on and taking off regular upper body clothing?: None Help from another person to put on and taking off regular lower body clothing?: A Little 6 Click Score: 20   End of Session Nurse Communication: Mobility status  Activity Tolerance: Patient tolerated treatment well Patient left: in bed;with call bell/phone within reach;with bed alarm set;with family/visitor present  OT Visit Diagnosis: Unsteadiness on feet (R26.81);Repeated falls (R29.6);Muscle weakness (generalized) (M62.81)                Time: 2947-6546 OT Time Calculation (min): 17 min Charges:  OT General Charges $OT Visit: 1 Visit OT Evaluation $OT Eval Low Complexity: 1 Low OT Treatments $Self Care/Home Management : 8-22 mins  Jackquline Denmark, MS, OTR/L , CBIS ascom [phone]  10/20/20, 12:01 PM

## 2020-10-20 NOTE — Evaluation (Signed)
Speech Language Pathology Evaluation Patient Details Name: Cassandra Schaefer MRN: [id number] DOB: Jul 19, 1928 Today's Date: 10/20/2020 Time: 1700-1749 SLP Time Calculation (min) (ACUTE ONLY): 25 min  Problem List:  Patient Active Problem List   Diagnosis Date Noted   Ischemic stroke (HCC) 10/20/2020   TIA (transient ischemic attack) 10/19/2020   Right hemiparesis (HCC) 10/19/2020   Type 2 diabetes mellitus with stage 3 chronic kidney disease, without long-term current use of insulin (HCC) 08/24/2018   Hyperlipidemia, unspecified 07/19/2017   Hypertension 07/19/2017   PUD (peptic ulcer disease) 07/19/2017   Type 2 diabetes mellitus (HCC) 07/19/2017   Personal history of gout 08/12/2016   Anemia, unspecified 04/09/2016   Hypothyroidism, acquired 12/10/2015   Past Medical History:  Past Medical History:  Diagnosis Date   Anemia    Cough    RESOLVING, NO FEVER   Diabetes mellitus without complication (HCC)    Gout    Hypertension    Hypothyroidism    PUD (peptic ulcer disease)    Wears dentures    full upper, partial lower   Past Surgical History:  Past Surgical History:  Procedure Laterality Date   AMPUTATION TOE Left 12/14/2017   Procedure: AMPUTATION TOE-4TH MPJ;  Surgeon: Gwyneth Revels, DPM;  Location: [facility];  Service: Podiatry;  Laterality: Left;  IVA LOCAL Diabetic - oral meds   APPENDECTOMY     CATARACT EXTRACTION W/PHACO Right 06/23/2015   Procedure: CATARACT EXTRACTION PHACO AND INTRAOCULAR LENS PLACEMENT (IOC);  Surgeon: Sallee Lange, MD;  Location: [facility];  Service: Ophthalmology;  Laterality: Right;  [place] 01:28 AP% 23.4 CDE 36.14 fluid pack lot # [id number]   CATARACT EXTRACTION W/PHACO Left 07/28/2015   Procedure: CATARACT EXTRACTION PHACO AND INTRAOCULAR LENS PLACEMENT (IOC);  Surgeon: Sallee Lange, MD;  Location: [facility];  Service: Ophthalmology;  Laterality: Left;  [place]    1:29.7 AP%  24.9 CDE   41.32 fluid casette lot #[id number] H exp  09/23/2016   COONOSCOPY     AND ENDOSCOPY   DILATION AND CURETTAGE OF UTERUS     TONSILLECTOMY     TUBAL LIGATION     HPI:  87 y.o. female with medical history significant for type 2 diabetes mellitus, essential hypertension, acquired hypothyroidism, hyperlipidemia, stage III chronic kidney disease with baseline creatinine 1.4-1.7, who is admitted to [facility] on 10/19/2020 with suspected TIA versus acute ischemic CVA after presenting from home to [facility] ED complaining of right-sided weakness. Confirmed L basal ganglia ischemic stroke.   Assessment / Plan / Recommendation Clinical Impression  Pt presents with higher level word finding deficits that are complicated by deficits in speech intelligibility at the phrase/sentence level. This results in difficulty with communicating more advanced information. Currently, pt has moderate difficulty with convergent naming tasks and during conversation, her speech contains semantic and phonemic paraphasias. Her speech intelligibility is reduced to ~ 75% at the sentence level d/t fast rate of speech, imprecise articulation and low vocal intensity. Education provided on these deficits as well as recommendation for HHST. All questions answered to pt and daughter's satisfaction.    SLP Assessment  SLP Recommendation/Assessment: All further Speech Lanaguage Pathology  needs can be addressed in the next venue of care (defer to [facility]) SLP Visit Diagnosis: Dysarthria and anarthria (R47.1);Aphasia (R47.01)    Follow Up Recommendations  Home health SLP    Frequency and Duration           SLP Evaluation Cognition  Overall Cognitive Status: Within Functional Limits for tasks  assessed       Comprehension  Auditory Comprehension Overall Auditory Comprehension: Appears within functional limits for tasks assessed Reading Comprehension Reading Status: Within funtional limits    Expression Expression Primary Mode of Expression:  Verbal Verbal Expression Overall Verbal Expression: Impaired Initiation: No impairment Automatic Speech: Name;Social Response;Month of year;Day of week Level of Generative/Spontaneous Verbalization: Sentence Repetition: No impairment Naming: Impairment Responsive: 76-100% accurate Confrontation: Within functional limits Convergent: 50-74% accurate Divergent: 50-74% accurate Verbal Errors: Phonemic paraphasias;Not aware of errors;Aware of errors Pragmatics: No impairment Non-Verbal Means of Communication: Not applicable Written Expression Dominant Hand: Right Written Expression: Not tested   Oral / Motor  Oral Motor/Sensory Function Overall Oral Motor/Sensory Function: Mild impairment Facial ROM: Reduced right Facial Symmetry: Within Functional Limits Facial Strength: Within Functional Limits Facial Sensation: Within Functional Limits Lingual ROM: Within Functional Limits Lingual Symmetry: Within Functional Limits Lingual Strength: Within Functional Limits Lingual Sensation: Within Functional Limits Mandible: Within Functional Limits Motor Speech Overall Motor Speech: Appears within functional limits for tasks assessed Respiration: Within functional limits Phonation: Low vocal intensity Resonance: Within functional limits Articulation: Impaired Level of Impairment: Sentence Intelligibility: Intelligibility reduced Word: 75-100% accurate Phrase: 75-100% accurate Sentence: 50-74% accurate Conversation: 50-74% accurate Motor Planning: Witnin functional limits Motor Speech Errors: Not applicable Effective Techniques: Slow rate   GO                   Cassandra Schaefer B. Dreama Saa M.S., CCC-SLP, [facility] Speech-Language Pathologist Rehabilitation Services Office [phone]  Cassandra Schaefer 10/20/2020, 3:40 PM

## 2020-10-20 NOTE — Progress Notes (Signed)
PHARMACIST - PHYSICIAN COMMUNICATION  CONCERNING:  Enoxaparin (Lovenox) for DVT Prophylaxis   DESCRIPTION: Patient was prescribed enoxaprin 40mg  q24 hours for VTE prophylaxis.   Filed Weights   10/19/20 2054 10/20/20 0045 10/20/20 0151  Weight: 59.4 kg (131 lb) 58.7 kg (129 lb 6.6 oz) 58.7 kg (129 lb 6.6 oz)    Body mass index is 22.92 kg/m.  Estimated Creatinine Clearance: 23.1 mL/min (A) (by C-G formula based on SCr of 1.34 mg/dL (H)).   Patient is candidate for enoxaparin 30mg  every 24 hours based on CrCl <49ml/min or Weight <45kg  RECOMMENDATION: Pharmacy has adjusted enoxaparin dose per [facility] policy.  Patient is now receiving enoxaparin 30 mg every 24 hours    31m, PharmD Clinical Pharmacist  10/20/2020 8:24 AM

## 2020-10-21 ENCOUNTER — Inpatient Hospital Stay: Payer: Medicare HMO

## 2020-10-21 DIAGNOSIS — I639 Cerebral infarction, unspecified: Secondary | ICD-10-CM

## 2020-10-21 DIAGNOSIS — G459 Transient cerebral ischemic attack, unspecified: Secondary | ICD-10-CM

## 2020-10-21 DIAGNOSIS — Z0000 Encounter for general adult medical examination without abnormal findings: Secondary | ICD-10-CM

## 2020-10-21 DIAGNOSIS — R531 Weakness: Secondary | ICD-10-CM

## 2020-10-21 DIAGNOSIS — I6529 Occlusion and stenosis of unspecified carotid artery: Secondary | ICD-10-CM

## 2020-10-21 LAB — BASIC METABOLIC PANEL
Anion gap: 6 (ref 5–15)
BUN: 27 mg/dL — ABNORMAL HIGH (ref 8–23)
CO2: 23 mmol/L (ref 22–32)
Calcium: 9 mg/dL (ref 8.9–10.3)
Chloride: 109 mmol/L (ref 98–111)
Creatinine, Ser: 1.51 mg/dL — ABNORMAL HIGH (ref 0.44–1.00)
GFR, Estimated: 33 mL/min — ABNORMAL LOW (ref >60)
Glucose, Bld: 141 mg/dL — ABNORMAL HIGH (ref 70–99)
Potassium: 4.7 mmol/L (ref 3.5–5.1)
Sodium: 138 mmol/L (ref 135–145)

## 2020-10-21 LAB — CBC
HCT: 34.7 % — ABNORMAL LOW (ref 36.0–46.0)
Hemoglobin: 11.6 g/dL — ABNORMAL LOW (ref 12.0–15.0)
MCH: 31.5 pg (ref 26.0–34.0)
MCHC: 33.4 g/dL (ref 30.0–36.0)
MCV: 94.3 fL (ref 80.0–100.0)
Platelets: 220 10*3/uL (ref 150–400)
RBC: 3.68 MIL/uL — ABNORMAL LOW (ref 3.87–5.11)
RDW: 12.1 % (ref 11.5–15.5)
WBC: 12.3 10*3/uL — ABNORMAL HIGH (ref 4.0–10.5)
nRBC: 0 % (ref 0.0–0.2)

## 2020-10-21 LAB — URINE CULTURE

## 2020-10-21 LAB — HEMOGLOBIN A1C
Hgb A1c MFr Bld: 7 % — ABNORMAL HIGH (ref 4.8–5.6)
Mean Plasma Glucose: 154 mg/dL

## 2020-10-21 LAB — GLUCOSE, CAPILLARY
Glucose-Capillary: 128 mg/dL — ABNORMAL HIGH (ref 70–99)
Glucose-Capillary: 283 mg/dL — ABNORMAL HIGH (ref 70–99)

## 2020-10-21 IMAGING — US US CAROTID DUPLEX BILAT
1 series · 13 of 24 positions shown · non-contrast
Comparison: None.

CLINICAL DATA: [AGE] female with a history of stroke

EXAM:
BILATERAL CAROTID DUPLEX ULTRASOUND
TECHNIQUE: Gray scale imaging, color Doppler and duplex ultrasound were
performed of bilateral carotid and vertebral arteries in the neck.

[Series 1: us carotid bilateral · 13 of 68 slices shown]
[im 1/68]
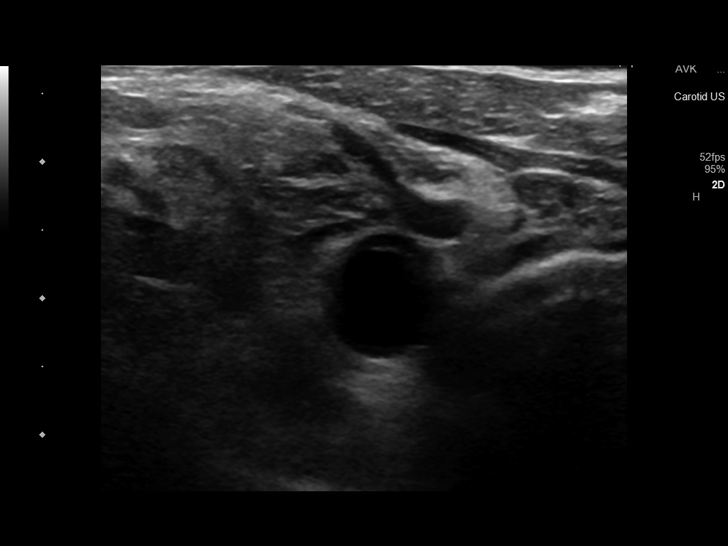
[im 6/68]
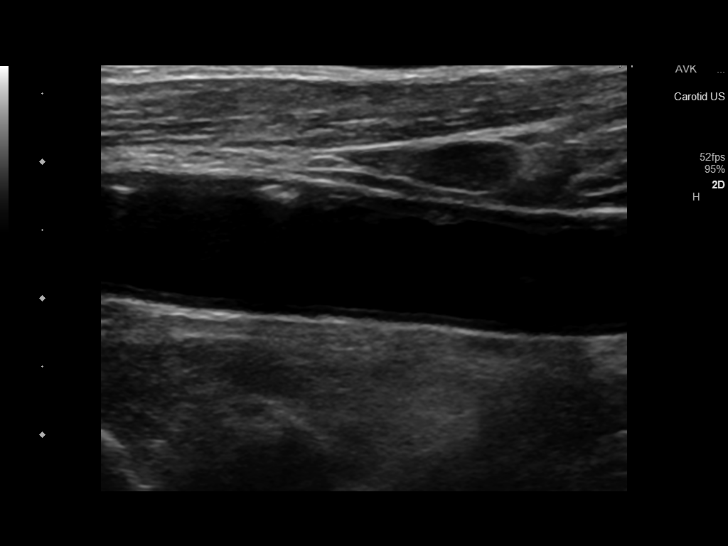
[im 12/68]
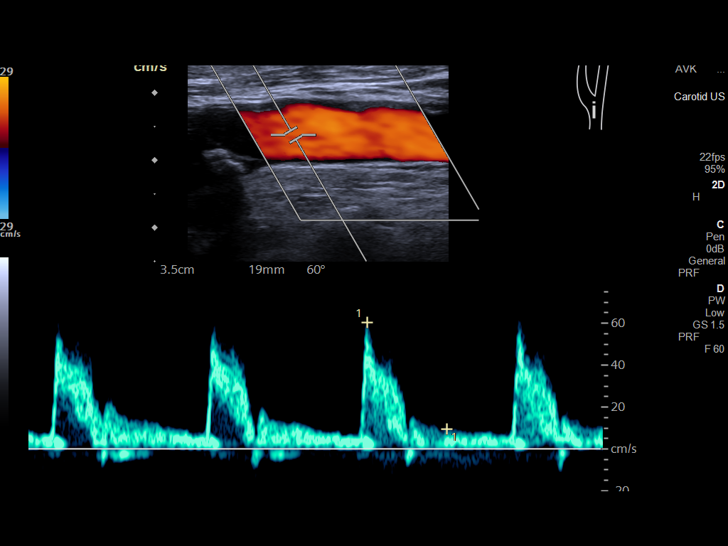
[im 18/68]
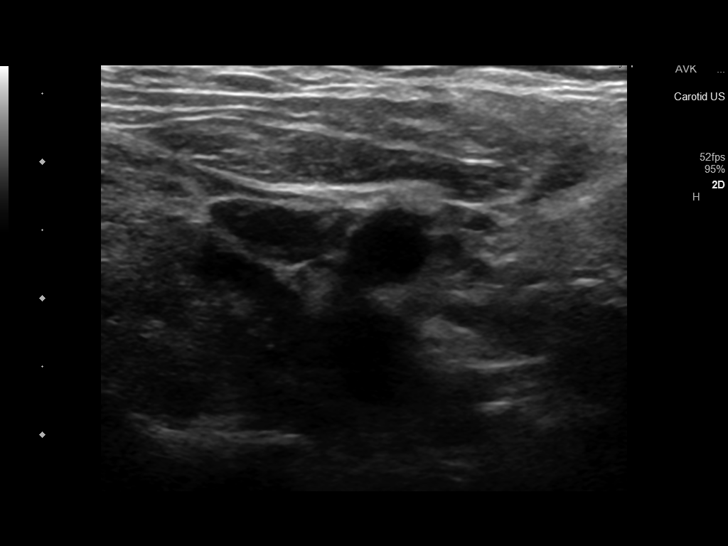
[im 24/68]
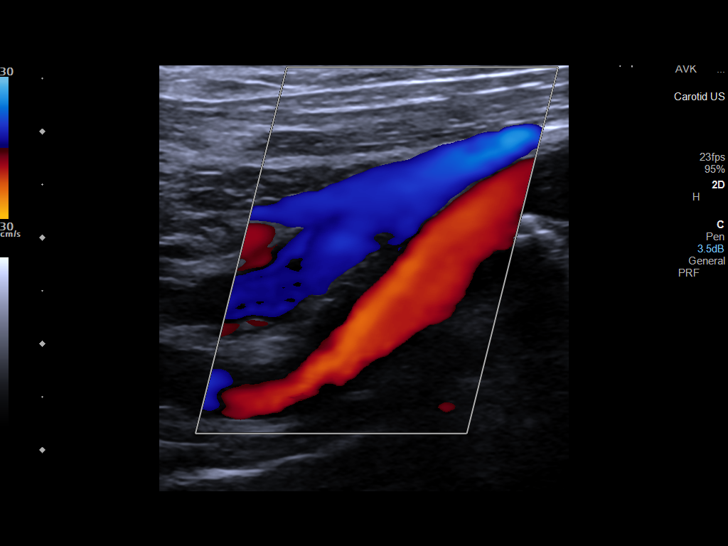
[im 30/68]
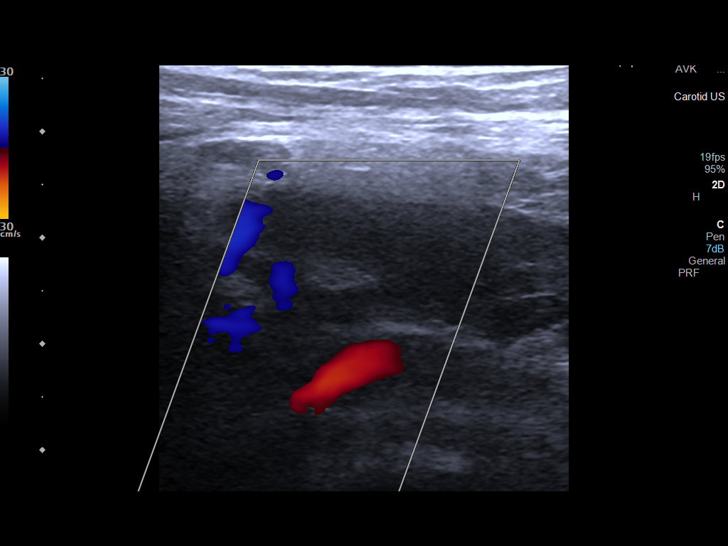
[im 35/68]
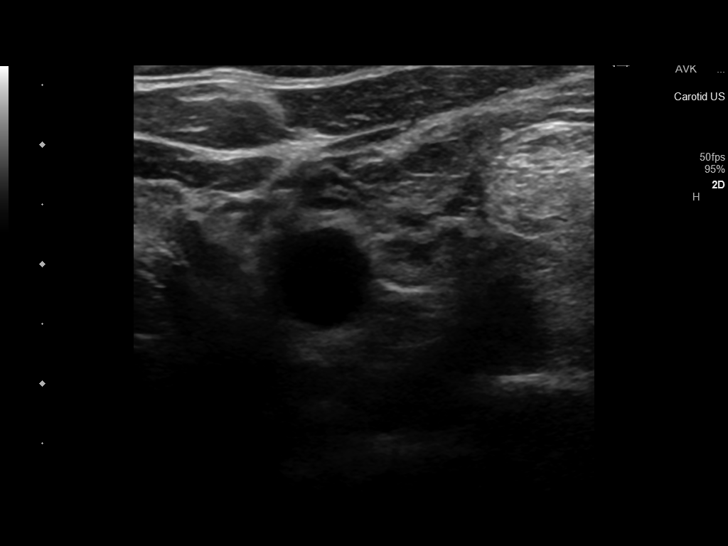
[im 38/68]
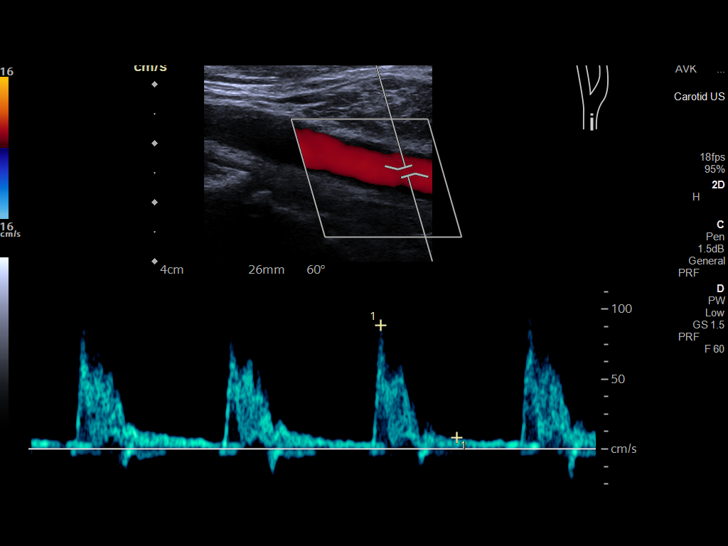
[im 44/68]
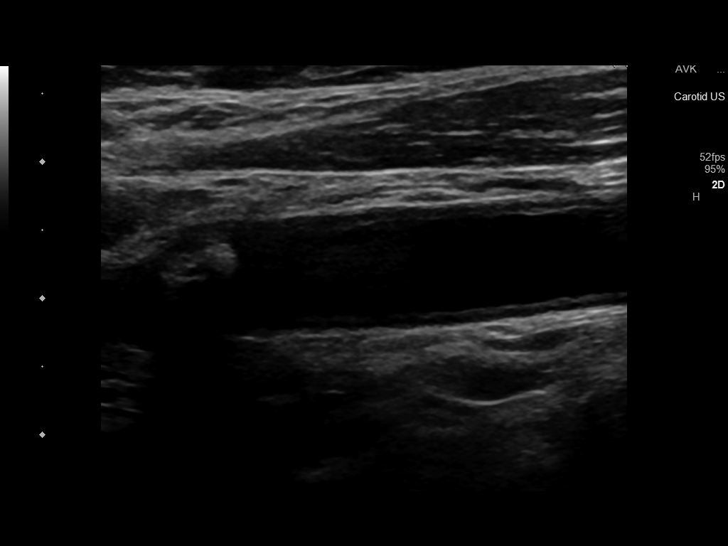
[im 50/68]
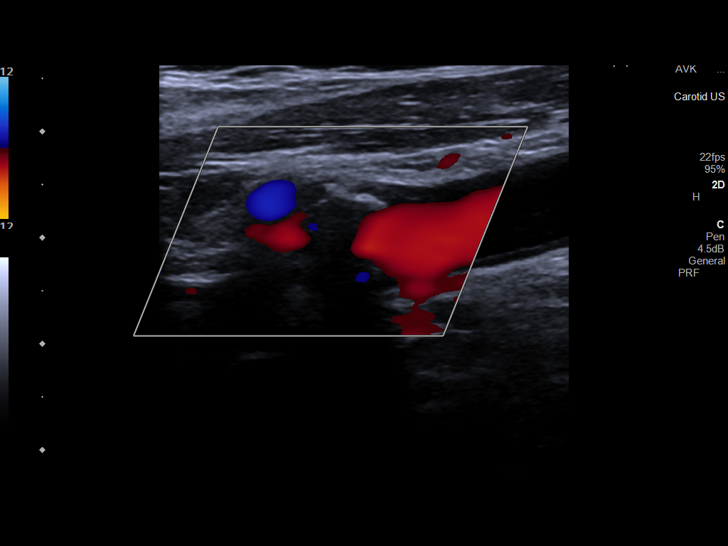
[im 56/68]
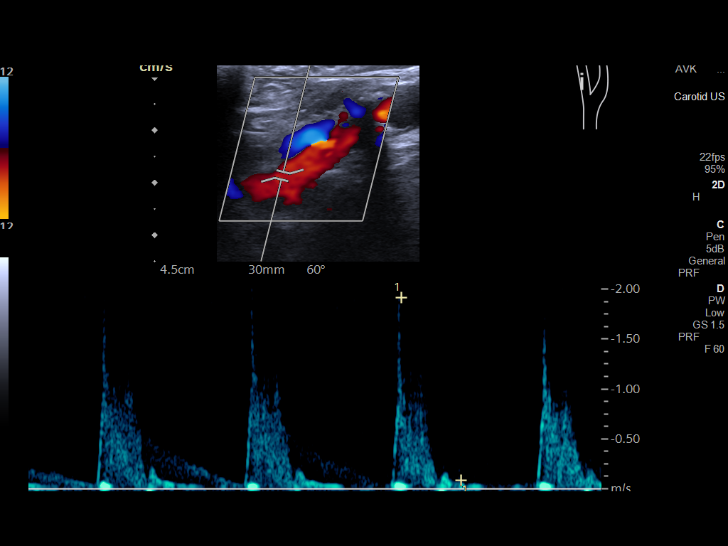
[im 62/68]
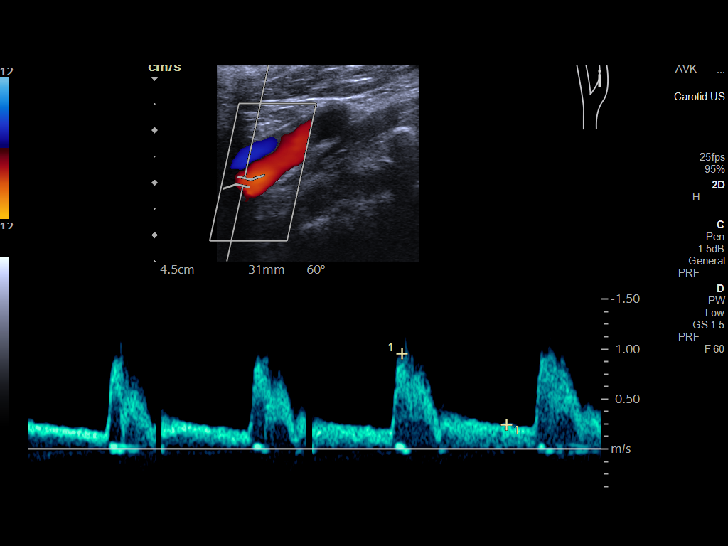
[im 68/68]
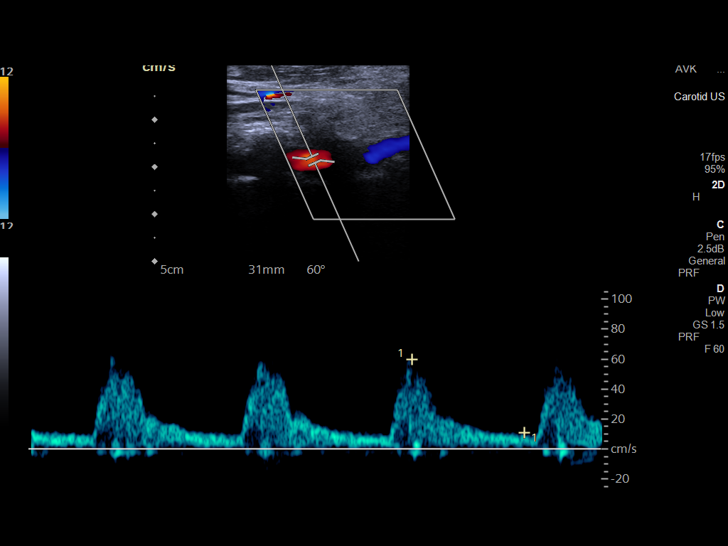

[13 of 24 positions shown; findings below may reference images not displayed]

FINDINGS: Criteria: Quantification of carotid stenosis is based on velocity
parameters that correlate the residual internal carotid diameter
with NASCET-based stenosis levels, using the diameter of the distal
internal carotid lumen as the denominator for stenosis measurement.

The following velocity measurements were obtained:

RIGHT

ICA:  Systolic 82 cm/sec, Diastolic 19 cm/sec

CCA:  60 cm/sec

SYSTOLIC ICA/CCA RATIO:

ECA:  225 cm/sec

LEFT

ICA:  Systolic 106 cm/sec, Diastolic 19 cm/sec

CCA:  75 cm/sec

SYSTOLIC ICA/CCA RATIO:

ECA:  192 cm/sec

Right Brachial SBP: Not acquired

Left Brachial SBP: Not acquired

RIGHT CAROTID ARTERY: No significant calcifications of the right
common carotid artery. Intermediate waveform maintained. Moderate
heterogeneous and partially calcified plaque at the right carotid
bifurcation. No significant lumen shadowing. Low resistance waveform
of the right ICA. No significant tortuosity.

RIGHT VERTEBRAL ARTERY: Antegrade flow with low resistance waveform.

LEFT CAROTID ARTERY: No significant calcifications of the left
common carotid artery. Intermediate waveform maintained. Moderate
heterogeneous and partially calcified plaque at the left carotid
bifurcation. No significant lumen shadowing. Low resistance waveform
of the left ICA. No significant tortuosity.

LEFT VERTEBRAL ARTERY:  Antegrade flow with low resistance waveform.
IMPRESSION: Color duplex indicates moderate heterogeneous and calcified plaque,
with no hemodynamically significant stenosis by duplex criteria in
the extracranial cerebrovascular circulation.

## 2020-10-21 IMAGING — MR MR HEAD W/O CM
14 series · 48 of 48 positions shown · non-contrast
Comparison: MRI of the brain October 20, 2020.

CLINICAL DATA: Stroke follow-up.

EXAM:
MRI HEAD WITHOUT CONTRAST
TECHNIQUE: Multiplanar, multiecho pulse sequences of the brain and surrounding
structures were obtained without intravenous contrast.

[Series 5: ax dwi_tracew · axial · 3.0mm · 0.65mm/px · z∈[-96,+56]mm · 2 of 48 slices shown]
[im 1/48]
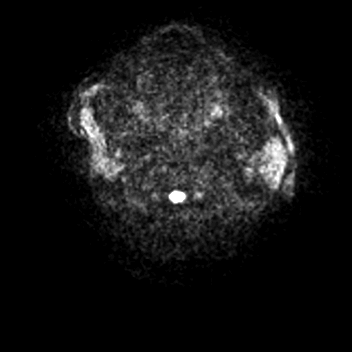
[im 48/48]
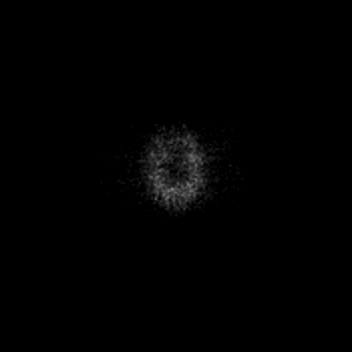

[Series 6: ax dwi_adc · axial · 3.0mm · 0.65mm/px · z∈[-96,+56]mm · 3 of 48 slices shown]
[im 1/48]
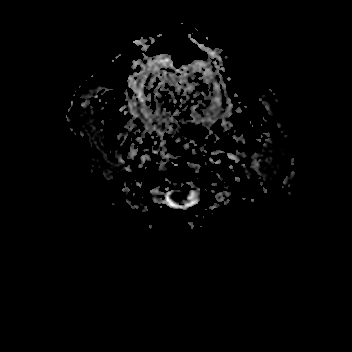
[im 24/48]
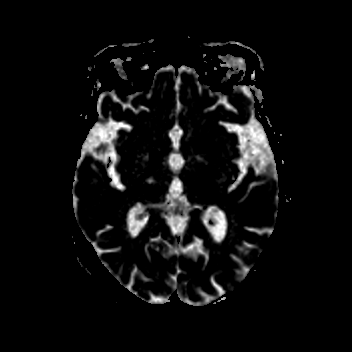
[im 48/48]
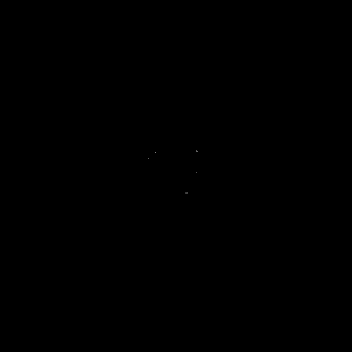

[Series 7: cor dwi_tracew · coronal · 5.0mm · 0.65mm/px · 3 of 40 slices shown]
[im 1/40]
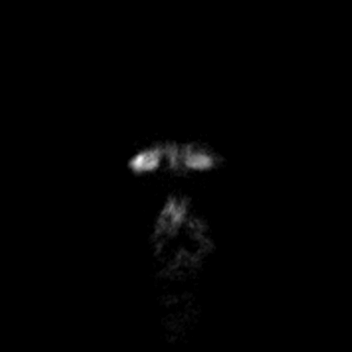
[im 20/40]
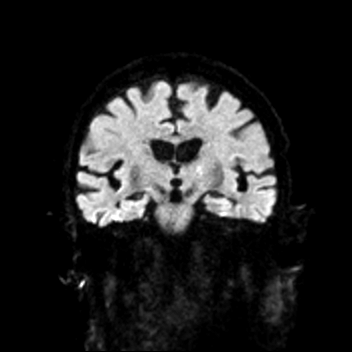
[im 40/40]
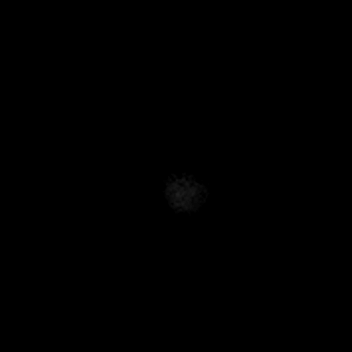

[Series 8: cor dwi_adc · coronal · 5.0mm · 0.65mm/px · 2 of 38 slices shown]
[im 1/38]
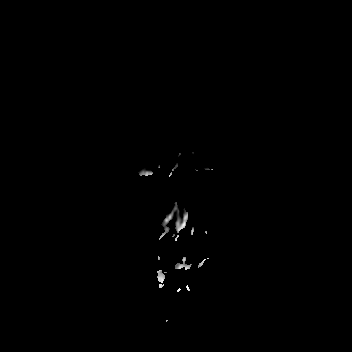
[im 38/38]
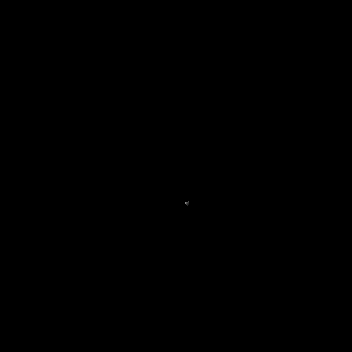

[Series 9: T1 · sagittal · 5.0mm · 0.62mm/px · 2 of 25 slices shown (1 of 2)]
[im 1/25]
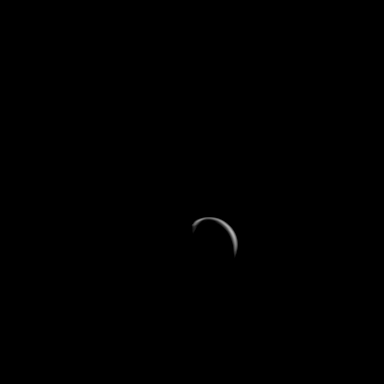
[im 25/25]
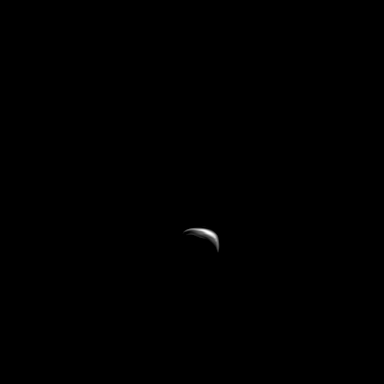

[Series 10: T2 · axial · 5.0mm · 0.53mm/px · z∈[-90,+51]mm · 2 of 25 slices shown (1 of 2)]
[im 1/25]
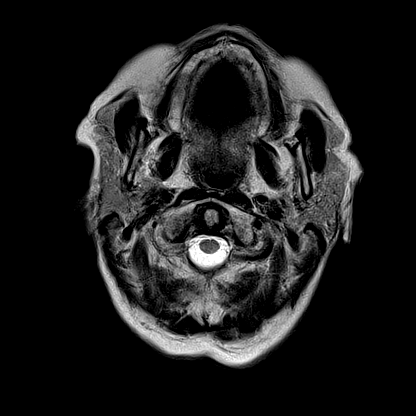
[im 25/25]
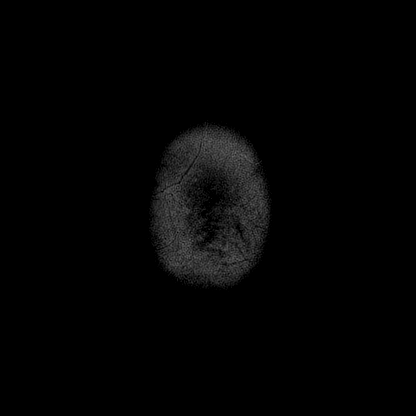

[Series 11: mag_images · axial · 3.0mm · 0.90mm/px · z∈[-108,+65]mm · 4 of 60 slices shown]
[im 1/60]
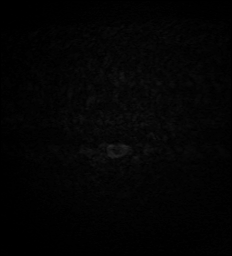
[im 20/60]
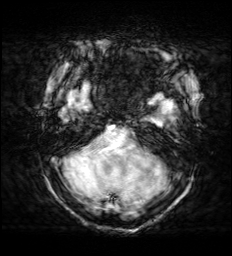
[im 40/60]
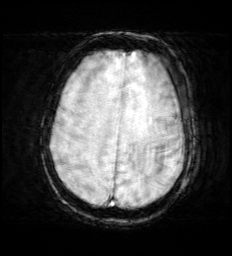
[im 60/60]
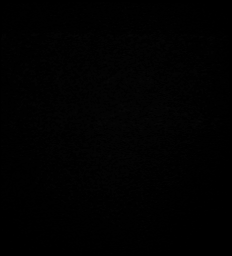

[Series 12: pha_images · axial · 3.0mm · 0.90mm/px · z∈[-96,+65]mm · 4 of 54 slices shown]
[im 1/54]
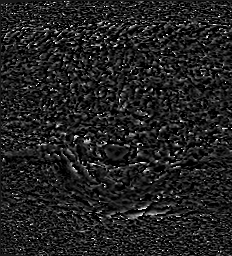
[im 18/54]
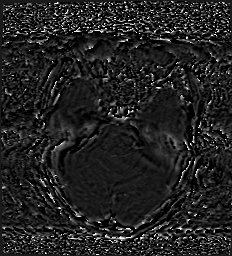
[im 36/54]
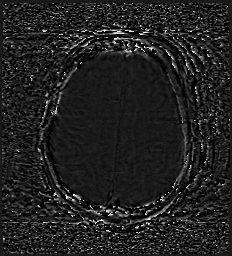
[im 54/54]
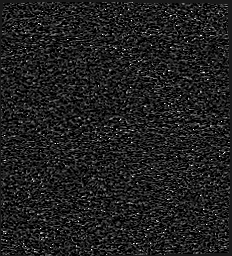

[Series 13: swi_images · axial · 3.0mm · 0.90mm/px · z∈[-108,+65]mm · 4 of 60 slices shown]
[im 1/60]
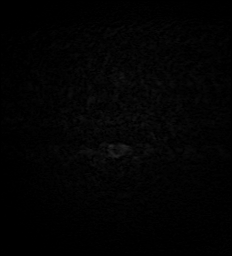
[im 20/60]
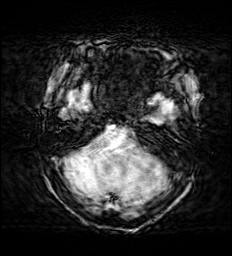
[im 40/60]
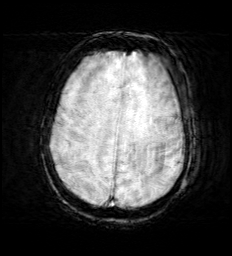
[im 60/60]
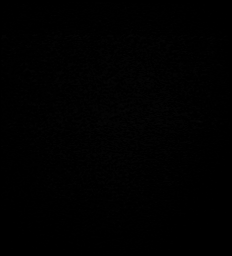

[Series 14: mip_images(sw) · axial · 24.0mm · 0.90mm/px · z∈[-97,+55]mm · 3 of 53 slices shown]
[im 1/53]
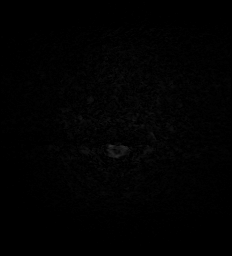
[im 27/53]
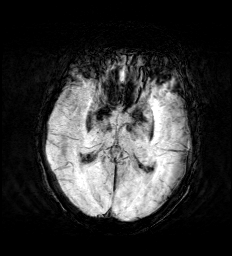
[im 53/53]
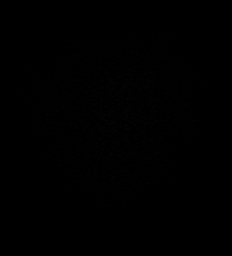

[Series 15: FLAIR · axial · 3.0mm · 0.53mm/px · z∈[-99,+59]mm · 4 of 55 slices shown (1 of 2)]
[im 1/55]
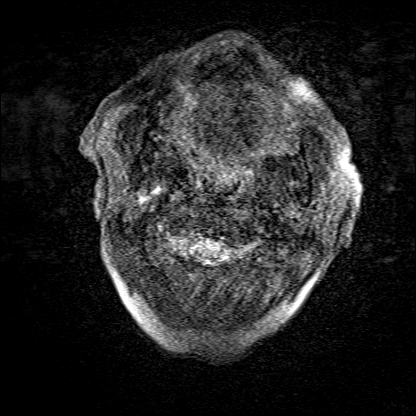
[im 19/55]
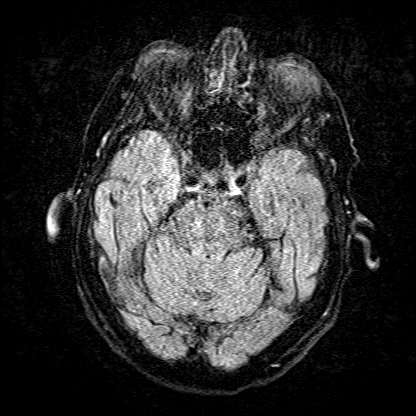
[im 37/55]
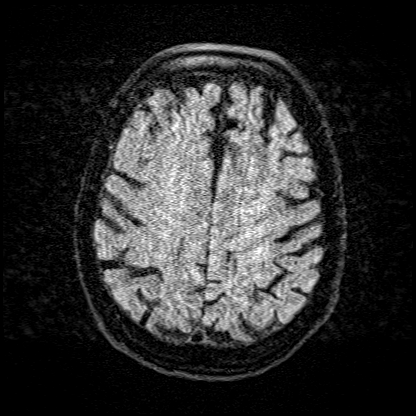
[im 55/55]
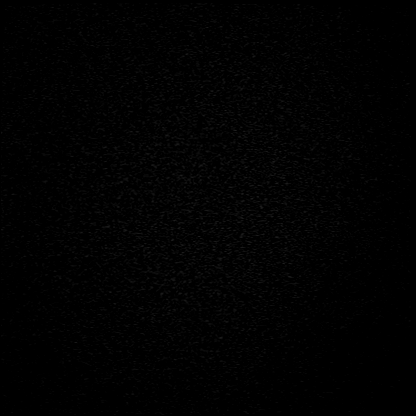

[Series 16: T1 · axial · 1.0mm · 0.98mm/px · z∈[-106,+65]mm · 11 of 176 slices shown (2 of 2)]
[im 1/176]
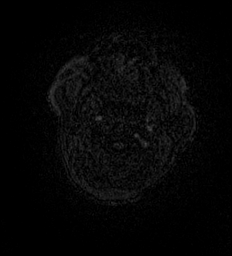
[im 18/176]
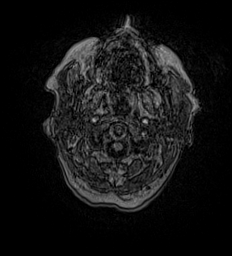
[im 36/176]
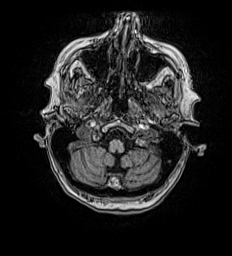
[im 53/176]
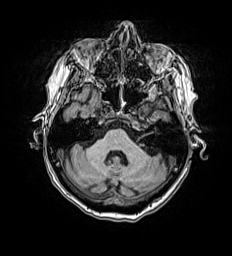
[im 71/176]
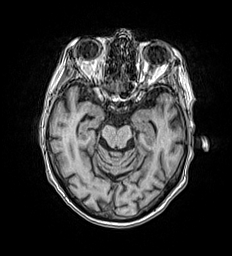
[im 88/176]
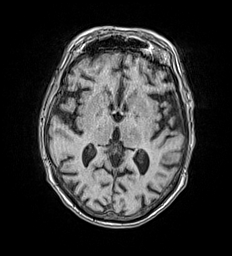
[im 106/176]
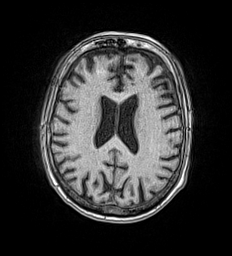
[im 123/176]
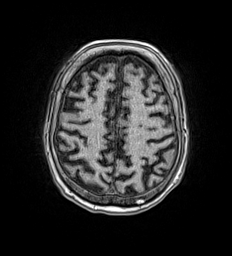
[im 141/176]
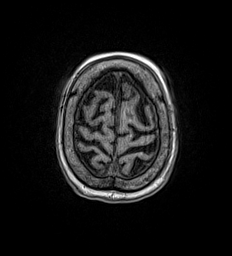
[im 158/176]
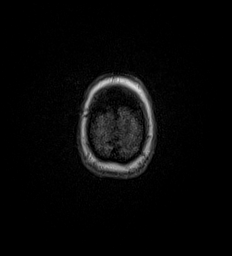
[im 176/176]
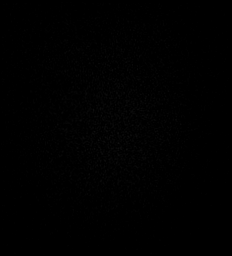

[Series 17: FLAIR · axial · 5.0mm · 1.20mm/px · z∈[-96,+56]mm · 2 of 27 slices shown (2 of 2)]
[im 1/27]
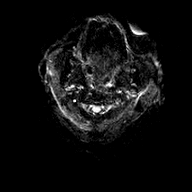
[im 27/27]
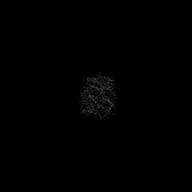

[Series 18: T2 · coronal · 5.0mm · 0.45mm/px · 2 of 31 slices shown (2 of 2)]
[im 1/31]
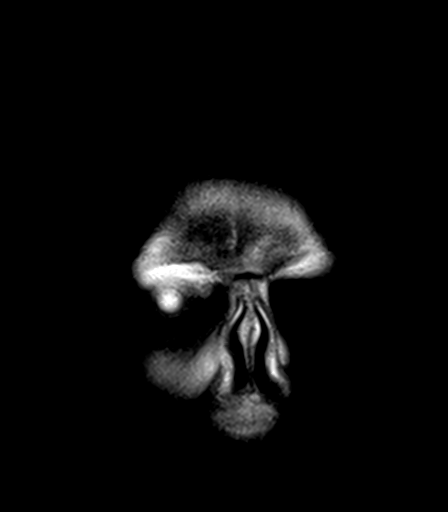
[im 31/31]
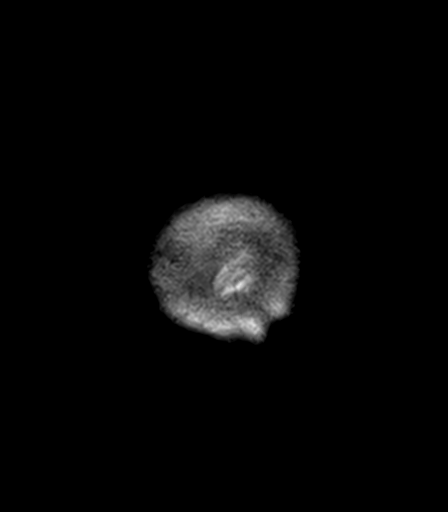

[48 of 48 positions shown; findings below may reference images not displayed]

FINDINGS: Brain: Interval progression of area of restricted diffusion
involving the left ventrolateral thalamus, measuring approximately
15 mm, consistent with acute infarct. No hemorrhage, hydrocephalus,
extra-axial collection or mass lesion. Remote lacunar infarct in the
right thalamus. Small amount of scattered foci of T2 hyperintensity
are seen within the white matter of the cerebral hemispheres and
within the pons, nonspecific, most likely related to chronic small
vessel ischemia. Mild parenchymal volume loss.

Vascular: Absent flow void in the V3 and V4 segments of the right
vertebral artery may be related to slow flow versus occlusion.

Skull and upper cervical spine: Normal marrow signal.

Sinuses/Orbits: Bilateral lens surgery. Paranasal sinuses are clear.

Other: None.
IMPRESSION: Significant interval progression of the area of restricted diffusion
within the left ventrolateral thalamus consistent with acute
infarct, now measuring 15 mm.

## 2020-10-21 MED ORDER — CLOPIDOGREL BISULFATE 75 MG PO TABS
75.0000 mg | ORAL_TABLET | Freq: Every day | ORAL | Status: DC
  Administered 2020-10-21 – 2020-10-24 (×4): 75 mg via ORAL
  Filled 2020-10-21 (×4): qty 1

## 2020-10-21 MED ORDER — LORAZEPAM 2 MG/ML IJ SOLN
INTRAMUSCULAR | Status: AC
  Filled 2020-10-21: qty 1

## 2020-10-21 MED ORDER — LORAZEPAM 2 MG/ML IJ SOLN
1.0000 mg | Freq: Once | INTRAMUSCULAR | Status: AC
  Administered 2020-10-21: 14:00:00 1 mg via INTRAVENOUS

## 2020-10-21 NOTE — Evaluation (Signed)
Speech Language Pathology Evaluation Patient Details Name: Cassandra Schaefer MRN: [id number] DOB: October 16, 1928 Today's Date: 10/21/2020 Time: 6301-6010 SLP Time Calculation (min) (ACUTE ONLY): 43 min  Problem List:  Patient Active Problem List   Diagnosis Date Noted   Ischemic stroke (HCC) 10/20/2020   TIA (transient ischemic attack) 10/19/2020   Right hemiparesis (HCC) 10/19/2020   Type 2 diabetes mellitus with stage 3 chronic kidney disease, without long-term current use of insulin (HCC) 08/24/2018   Hyperlipidemia, unspecified 07/19/2017   Hypertension 07/19/2017   PUD (peptic ulcer disease) 07/19/2017   Type 2 diabetes mellitus (HCC) 07/19/2017   Personal history of gout 08/12/2016   Anemia, unspecified 04/09/2016   Hypothyroidism, acquired 12/10/2015   Past Medical History:  Past Medical History:  Diagnosis Date   Anemia    Cough    RESOLVING, NO FEVER   Diabetes mellitus without complication (HCC)    Gout    Hypertension    Hypothyroidism    PUD (peptic ulcer disease)    Wears dentures    full upper, partial lower   Past Surgical History:  Past Surgical History:  Procedure Laterality Date   AMPUTATION TOE Left 12/14/2017   Procedure: AMPUTATION TOE-4TH MPJ;  Surgeon: Gwyneth Revels, DPM;  Location: [facility];  Service: Podiatry;  Laterality: Left;  IVA LOCAL Diabetic - oral meds   APPENDECTOMY     CATARACT EXTRACTION W/PHACO Right 06/23/2015   Procedure: CATARACT EXTRACTION PHACO AND INTRAOCULAR LENS PLACEMENT (IOC);  Surgeon: Sallee Lange, MD;  Location: [facility];  Service: Ophthalmology;  Laterality: Right;  [place] 01:28 AP% 23.4 CDE 36.14 fluid pack lot # [id number]   CATARACT EXTRACTION W/PHACO Left 07/28/2015   Procedure: CATARACT EXTRACTION PHACO AND INTRAOCULAR LENS PLACEMENT (IOC);  Surgeon: Sallee Lange, MD;  Location: [facility];  Service: Ophthalmology;  Laterality: Left;  [place]    1:29.7 AP%  24.9 CDE   41.32 fluid casette lot #[id number] H exp  09/23/2016   COONOSCOPY     AND ENDOSCOPY   DILATION AND CURETTAGE OF UTERUS     TONSILLECTOMY     TUBAL LIGATION     HPI:  87 y.o. female with medical history significant for type 2 diabetes mellitus, essential hypertension, acquired hypothyroidism, hyperlipidemia, stage III chronic kidney disease with baseline creatinine 1.4-1.7, who is admitted to [facility] on 10/19/2020 with suspected TIA versus acute ischemic CVA after presenting from home to [facility] ED complaining of right-sided weakness. Confirmed L basal ganglia ischemic stroke.   Assessment / Plan / Recommendation Clinical Impression  Re-consult was received d/t report by physical therapy that pt was experiencing a noticeable decline in word finding, speech intelligibility and daughter reported difficulty chewing breakfast. Pt does present with worsening deficits over previous day's assessment. Currently, she has a moderate right facial weakness with tongue deviation to right. Pt is now demonstrating a moderate expressive aphasia c/b halting speech, dysfluent phonemic repetitions, short utterances limited to single word - occasional 2 words, occasional word salad and decreased awareness of errors. This decline in ability is further conceptualized by pt's score on the Western Aphasia Bedside. She received a score of 73 which is considered moderately impaired. Her speech intelligibility is reduced to ~ 50% at the phrase level d/t consonant cluster deletion, syllable reduction, significantly decreased volume, speaking 1-2 words on single breath, increased WOB, strained vocal quality and imprecise articulation. At this time, pt requires more intensive therapy to return to functional independence, therefore recommend [name].  Secure chat sent to  pt's neurologist requesting MRI d/t concern for neurological worsening.   Pt's oropharyngeal abilities were screened with no overt s/s of aspiration at bedside. However, is having more  throat clears at baseline.      SLP Assessment  SLP Recommendation/Assessment: Patient needs continued Speech Lanaguage Pathology Services SLP Visit Diagnosis: Aphasia (R47.01);Dysarthria and anarthria (R47.1);Apraxia (R48.2)    Follow Up Recommendations  Inpatient Rehab    Frequency and Duration min 2x/week  2 weeks      SLP Evaluation Cognition  Overall Cognitive Status: Within Functional Limits for tasks assessed Orientation Level: Oriented X4       Comprehension  Auditory Comprehension Overall Auditory Comprehension: Appears within functional limits for tasks assessed Visual Recognition/Discrimination Discrimination: Within Function Limits Reading Comprehension Reading Status: Within funtional limits    Expression Expression Primary Mode of Expression: Verbal Verbal Expression Overall Verbal Expression: Impaired Initiation: Impaired Automatic Speech: Name;Social Response Level of Generative/Spontaneous Verbalization: Phrase Repetition: Impaired Level of Impairment: Word level Naming: Impairment Responsive: 51-75% accurate Confrontation: Within functional limits Convergent: 50-74% accurate Divergent: 25-49% accurate Verbal Errors: Semantic paraphasias;Phonemic paraphasias;Jargon;Aware of errors;Not aware of errors Pragmatics: No impairment Interfering Components: Speech intelligibility Effective Techniques: Sentence completion;Open ended questions Non-Verbal Means of Communication: Not applicable Written Expression Dominant Hand: Right Written Expression: Not tested   Oral / Motor  Oral Motor/Sensory Function Overall Oral Motor/Sensory Function: Moderate impairment Facial ROM: Reduced right Facial Symmetry: Abnormal symmetry right Facial Strength: Reduced right Facial Sensation: Within Functional Limits Lingual ROM: Reduced right Lingual Symmetry: Abnormal symmetry right Lingual Strength: Reduced Lingual Sensation: Within Functional Limits Mandible:  Within Functional Limits Motor Speech Overall Motor Speech: Impaired Respiration: Impaired Level of Impairment: Phrase Phonation: Low vocal intensity Resonance: Within functional limits Articulation: Impaired Level of Impairment: Word Intelligibility: Intelligibility reduced Word: 50-74% accurate Phrase: 25-49% accurate Sentence: 25-49% accurate Conversation: Not tested Motor Planning: Impaired Level of Impairment: Phrase Motor Speech Errors: Aware;Inconsistent Effective Techniques: Slow rate;Increased vocal intensity;Over-articulate   GO                   Camdyn Beske B. Dreama Saa M.S., CCC-SLP, [facility] Speech-Language Pathologist Rehabilitation Services Office [phone]  [name] 10/21/2020, 2:43 PM

## 2020-10-21 NOTE — Consult Note (Signed)
[facility] Vascular Consult Note  MRN : [id number]  Cassandra Schaefer is a 87 y.o. ([birth year]) female who presents with chief complaint of  Chief Complaint  Patient presents with   Cerebrovascular Accident  .  History of Present Illness: I am asked to see the patient by Dr. Ashok Pall for evaluation of carotid stenosis with recent stroke.  The patient presented with some right arm weakness and discoordination but originally was fairly mild but over the past 24 hours has evolved and become much more pronounced.  She is also much more lethargic.  She had no previous history of TIA or stroke.  Her daughter provides the history today.  She has undergone multiple imaging studies which I have independently reviewed.  She had an MRI yesterday which showed a very small left acute stroke.  Unfortunately, her MRI today shows significant enlargement of this left-sided stroke.  It went from about 5 mm to about 15 mm in size.  She had a CT angiogram of the head and neck which I have reviewed.  This is officially interpreted as a 70% left ICA stenosis as well as a left vertebral artery stenosis at its origin.  There was mild right internal carotid artery stenosis as well.  I actually would interpret this as a higher degree of stenosis as it is circumferentially calcified and very dense, but appears to be a fairly high-grade stenosis.  She had a carotid duplex done today which was interpreted as having plaque bilaterally without any significant stenosis, but due to the dense circumferential calcification this tends to create underestimate of the degree of stenosis with carotid duplex making carotid duplex less helpful.  Current Facility-Administered Medications  Medication Dose Route Frequency Provider Last Rate Last Admin   acetaminophen (TYLENOL) tablet 650 mg  650 mg Oral Q6H PRN Howerter, Justin B, DO       Or   acetaminophen (TYLENOL) suppository 650 mg  650 mg Rectal Q6H PRN Howerter,  Justin B, DO       aspirin chewable tablet 81 mg  81 mg Oral Daily Howerter, Justin B, DO   81 mg at 10/21/20 6578   atorvastatin (LIPITOR) tablet 80 mg  80 mg Oral Daily Kathrynn Running, MD   80 mg at 10/21/20 0920   clopidogrel (PLAVIX) tablet 75 mg  75 mg Oral Daily Caryl Pina, MD       enoxaparin (LOVENOX) injection 30 mg  30 mg Subcutaneous Q24H Wouk, Wilfred Curtis, MD   30 mg at 10/21/20 4696   hydrALAZINE (APRESOLINE) injection 10 mg  10 mg Intravenous Q4H PRN Howerter, Justin B, DO       insulin aspart (novoLOG) injection 0-5 Units  0-5 Units Subcutaneous QHS Howerter, Justin B, DO       levothyroxine (SYNTHROID) tablet 88 mcg  88 mcg Oral Q0600 Howerter, Justin B, DO   88 mcg at 10/21/20 0503   LORazepam (ATIVAN) 2 MG/ML injection             Past Medical History:  Diagnosis Date   Anemia    Cough    RESOLVING, NO FEVER   Diabetes mellitus without complication (HCC)    Gout    Hypertension    Hypothyroidism    PUD (peptic ulcer disease)    Wears dentures    full upper, partial lower    Past Surgical History:  Procedure Laterality Date   AMPUTATION TOE Left 12/14/2017   Procedure: AMPUTATION TOE-4TH MPJ;  Surgeon: Gwyneth RevelsFowler, Justin, DPM;  [facility];  Service: Podiatry;  Laterality: Left;  IVA LOCAL Diabetic - oral meds   APPENDECTOMY     CATARACT EXTRACTION W/PHACO Right 06/23/2015   Procedure: CATARACT EXTRACTION PHACO AND INTRAOCULAR LENS PLACEMENT (IOC);  Surgeon: Sallee LangeSteven Dingeldein, MD;  Location: [facility];  Service: Ophthalmology;  Laterality: Right;  US 01:28 AP% 23.4 CDE 36.14 fluid pack lot # [id number]   CATARACT EXTRACTION W/PHACO Left 07/28/2015   Procedure: CATARACT EXTRACTION PHACO AND INTRAOCULAR LENS PLACEMENT (IOC);  Surgeon: Sallee LangeSteven Dingeldein, MD;  Location: [facility];  Service: Ophthalmology;  Laterality: Left;  US    1:29.7 AP%  24.9 CDE   41.32 fluid casette lot #[id number] H exp 09/23/2016   COONOSCOPY     AND ENDOSCOPY   DILATION AND  CURETTAGE OF UTERUS     TONSILLECTOMY     TUBAL LIGATION       Social History   Tobacco Use   Smoking status: Former    Pack years: 0.00    Types: Cigarettes    Quit date: 12/26/1983    Years since quitting: 36.8   Smokeless tobacco: Never  Vaping Use   Vaping Use: Never used  Substance Use Topics   Alcohol use: No     Family History  Problem Relation Age of Onset   Breast cancer Neg Hx   No bleeding disorders, clotting disorders, or aneurysms  Allergies  Allergen Reactions   Penicillins Hives   Sulfa Antibiotics Hives   Tetanus Toxoids Swelling   Procaine Other (See Comments)    "went into shock"   Tuberculin Tests Swelling and Rash     REVIEW OF SYSTEMS (Negative unless checked)  Constitutional: [] Weight loss  [] Fever  [] Chills Cardiac: [] Chest pain   [] Chest pressure   [] Palpitations   [] Shortness of breath when laying flat   [] Shortness of breath at rest   [] Shortness of breath with exertion. Vascular:  [] Pain in legs with walking   [] Pain in legs at rest   [] Pain in legs when laying flat   [] Claudication   [] Pain in feet when walking  [] Pain in feet at rest  [] Pain in feet when laying flat   [] History of DVT   [] Phlebitis   [] Swelling in legs   [] Varicose veins   [] Non-healing ulcers Pulmonary:   [] Uses home oxygen   [] Productive cough   [] Hemoptysis   [] Wheeze  [] COPD   [] Asthma Neurologic:  [] Dizziness  [] Blackouts   [] Seizures   [x] History of stroke   [] History of TIA  [] Aphasia   [] Temporary blindness   [] Dysphagia   [x] Weakness or numbness in arms   [] Weakness or numbness in legs Musculoskeletal:  [x] Arthritis   [] Joint swelling   [] Joint pain   [] Low back pain Hematologic:  [] Easy bruising  [] Easy bleeding   [] Hypercoagulable state   [x] Anemic  [] Hepatitis Gastrointestinal:  [] Blood in stool   [] Vomiting blood  [] Gastroesophageal reflux/heartburn   [] Difficulty swallowing. Genitourinary:  [] Chronic kidney disease   [] Difficult urination  [] Frequent urination   [] Burning with urination   [] Blood in urine Skin:  [] Rashes   [] Ulcers   [] Wounds Psychological:  [] History of anxiety   []  History of major depression.  Physical Examination  Vitals:   10/21/20 0349 10/21/20 0450 10/21/20 0745 10/21/20 1122  BP: 140/63  (!) 133/57 (!) 144/62  Pulse: 64  63 67  Resp: 16  16 20   Temp: 97.8 F (36.6 C)  97.9 F (36.6 C) 97.9 F (36.6 C)  TempSrc:      SpO2: 95%  96% 98%  Weight:  55.2 kg    Height:       Body mass index is 21.56 kg/m. Gen:  WD/WN, NAD.  Appears younger than stated age Head: [place]/AT, No temporalis wasting.  Ear/Nose/Throat: Hearing grossly intact, nares w/o erythema or drainage, oropharynx w/o Erythema/Exudate Eyes: Sclera non-icteric, conjunctiva clear Neck: Trachea midline.  No JVD.  Pulmonary:  Good air movement, respirations not labored, equal bilaterally.  Cardiac: RRR, normal S1, S2. Vascular:  Vessel Right Left  Radial Palpable Palpable   Musculoskeletal: M/S 5/5 throughout.  Extremities without ischemic changes.  No deformity or atrophy. No edema. Neurologic: Patient is somnolent.  Right-sided weakness and discoordination in the arm is prominent. Psychiatric: Judgment and insight are difficult to discern due to her changes from stroke. Dermatologic: No rashes or ulcers noted.  No cellulitis or open wounds.      CBC Lab Results  Component Value Date   WBC 12.3 (H) 10/21/2020   HGB 11.6 (L) 10/21/2020   HCT 34.7 (L) 10/21/2020   MCV 94.3 10/21/2020   PLT 220 10/21/2020    BMET    Component Value Date/Time   NA 138 10/21/2020 0405   K 4.7 10/21/2020 0405   CL 109 10/21/2020 0405   CO2 23 10/21/2020 0405   GLUCOSE 141 (H) 10/21/2020 0405   BUN 27 (H) 10/21/2020 0405   CREATININE 1.51 (H) 10/21/2020 0405   CALCIUM 9.0 10/21/2020 0405   GFRNONAA 33 (L) 10/21/2020 0405   Estimated Creatinine Clearance: 20.5 mL/min (A) (by C-G formula based on SCr of 1.51 mg/dL (H)).  COAG Lab Results  Component Value  Date   INR 1.1 10/19/2020    Radiology CT ANGIO HEAD NECK W WO CM  Result Date: 10/19/2020 CLINICAL DATA:  Initial evaluation for acute right-sided weakness, speech difficulty. EXAM: CT ANGIOGRAPHY HEAD AND NECK TECHNIQUE: Multidetector CT imaging of the head and neck was performed using the standard protocol during bolus administration of intravenous contrast. Multiplanar CT image reconstructions and MIPs were obtained to evaluate the vascular anatomy. Carotid stenosis measurements (when applicable) are obtained utilizing NASCET criteria, using the distal internal carotid diameter as the denominator. CONTRAST:  75mL OMNIPAQUE IOHEXOL 350 MG/ML SOLN COMPARISON:  None. FINDINGS: CT HEAD FINDINGS Brain: Generalized age-related cerebral atrophy. Few small remote lacunar infarcts noted about the right basal ganglia and right thalamus. No acute intracranial hemorrhage. No acute large vessel territory infarct. No mass lesion, midline shift or mass effect. No hydrocephalus or extra-axial fluid collection. Vascular: No hyperdense vessel. Scattered vascular calcifications noted within the carotid siphons. Skull: Scalp soft tissues demonstrate no acute finding. Calvarium intact. Sinuses: Changes of chronic sinusitis noted. No active disease. No mastoid effusion. Orbits:  Globes orbital soft tissues demonstrate no acute finding. CTA NECK FINDINGS Aortic arch: Visualized aortic arch normal caliber with normal 3 vessel morphology. Moderate atheromatous change about the arch and origin of the great vessels without high-grade stenosis. Right carotid system: Right CCA patent from its origin to the bifurcation without stenosis. Eccentric mixed plaque at the right carotid bulb/proximal right ICA with associated mild 35% stenosis by NASCET criteria. Right ICA patent distally without stenosis, dissection or occlusion. Left carotid system: The CCA patent from its origin to the bifurcation without stenosis. Bulky calcified plaque  about the left carotid bulb with associated stenosis of up to 70% by NASCET criteria. Left ICA patent distally without stenosis, dissection or occlusion. Vertebral arteries: Both vertebral arteries  arise from subclavian arteries. No significant proximal subclavian artery stenosis. Strongly dominant left vertebral artery with a diffusely hypoplastic right vertebral artery. Atheromatous plaque at the origin of the dominant left vertebral artery with associated severe stenosis (series 10, image 77). Dominant left vertebral artery otherwise patent within the neck. Focal severe distal right V3 stenosis noted just prior to cranial vault (series 10, image 196). Skeleton: No visible acute osseous finding. No discrete or worrisome osseous lesions. Other neck: No other acute soft tissue abnormality within the neck. No mass or adenopathy. Upper chest: Scattered interlobular septal thickening noted within the visualized lungs, suggesting mild pulmonary interstitial edema. Mildly prominent precarinal node measures up to the upper limits of normal at 1 cm. Visualized upper chest demonstrates no other acute finding. Review of the MIP images confirms the above findings CTA HEAD FINDINGS Anterior circulation: Petrous segments patent bilaterally. Scattered atheromatous change within the carotid siphons without high-grade stenosis. A1 segments patent bilaterally. Normal anterior communicating artery complex. Anterior cerebral arteries patent to their distal aspects without stenosis. No M1 stenosis or occlusion. Normal MCA bifurcations. Distal MCA branches well perfused and symmetric. Posterior circulation: Dominant left vertebral artery widely patent to the vertebrobasilar junction. Right vertebral artery markedly hypoplastic and irregular, but is grossly patent to the vertebrobasilar junction as well. Right PICA origin patent. Left PICA not seen. Basilar patent to its distal aspect without stenosis. Superior cerebral arteries patent  bilaterally. Left PCA supplied via the basilar and is widely patent to its distal aspect. Fetal type origin of the right PCA. Atheromatous change throughout the right PCA with associated moderate multifocal P2/P3 stenoses. Venous sinuses: Grossly patent allowing for timing the contrast bolus. Anatomic variants: Fetal type origin of the right PCA.  No aneurysm. Review of the MIP images confirms the above findings IMPRESSION: CT HEAD IMPRESSION: 1. No acute intracranial abnormality. 2. Few small remote lacunar infarcts about the right basal ganglia and thalamus. CTA HEAD AND NECK IMPRESSION: 1. Negative CTA for emergent large vessel occlusion. 2. Bulky calcified plaque about the left carotid bulb with associated stenosis of up to 70% by NASCET criteria. 3. Atheromatous plaque at the origin of the dominant left vertebral artery with associated severe stenosis. Strongly dominant left vertebral artery otherwise patent within the neck. 4. Atheromatous change throughout the right PCA with associated moderate multifocal P2/P3 stenoses. 5. Mild pulmonary interstitial edema. Electronically Signed   By: Rise Mu M.D.   On: 10/19/2020 22:55   MR BRAIN WO CONTRAST  Result Date: 10/21/2020 CLINICAL DATA:  Stroke follow-up. EXAM: MRI HEAD WITHOUT CONTRAST TECHNIQUE: Multiplanar, multiecho pulse sequences of the brain and surrounding structures were obtained without intravenous contrast. COMPARISON:  MRI of the brain October 20, 2020. FINDINGS: Brain: Interval progression of area of restricted diffusion involving the left ventrolateral thalamus, measuring approximately 15 mm, consistent with acute infarct. No hemorrhage, hydrocephalus, extra-axial collection or mass lesion. Remote lacunar infarct in the right thalamus. Small amount of scattered foci of T2 hyperintensity are seen within the white matter of the cerebral hemispheres and within the pons, nonspecific, most likely related to chronic small vessel ischemia.  Mild parenchymal volume loss. Vascular: Absent flow void in the V3 and V4 segments of the right vertebral artery may be related to slow flow versus occlusion. Skull and upper cervical spine: Normal marrow signal. Sinuses/Orbits: Bilateral lens surgery. Paranasal sinuses are clear. Other: None. IMPRESSION: Significant interval progression of the area of restricted diffusion within the left ventrolateral thalamus consistent with acute infarct, now  measuring 15 mm. Electronically Signed   By: Baldemar Lenis M.D.   On: 10/21/2020 15:32   MR BRAIN WO CONTRAST  Result Date: 10/20/2020 CLINICAL DATA:  Initial evaluation for neuro deficit, stroke suspected. Acute right-sided weakness. EXAM: MRI HEAD WITHOUT CONTRAST TECHNIQUE: Multiplanar, multiecho pulse sequences of the brain and surrounding structures were obtained without intravenous contrast. COMPARISON:  Prior CTA from 10/19/2020. FINDINGS: Brain: Mild diffuse prominence of the CSF containing spaces compatible generalized cerebral atrophy. Scattered patchy T2/FLAIR hyperintensity within the periventricular and deep white matter both cerebral hemispheres as well as the pons, most consistent with chronic small vessel ischemic disease, mild for age. Few scattered remote lacunar infarcts present about the basal ganglia and thalami. There is a subtle punctate 5 mm focus of diffusion signal abnormality involving the left basal ganglia, only seen on coronal DWI sequence (series 7, image 18). Given the patient history of right-sided weakness, findings suspicious for a tiny punctate ischemic infarct. No associated hemorrhage or mass effect. No other diffusion abnormality to suggest acute or subacute ischemia. Gray-white matter differentiation otherwise maintained. No other areas of remote cortical infarction. No other evidence for acute or chronic intracranial hemorrhage. No mass lesion, midline shift or mass effect. No hydrocephalus or extra-axial fluid  collection. Pituitary gland suprasellar region within normal limits. Midline structures intact. Vascular: Major intracranial vascular flow voids are maintained. Skull and upper cervical spine: Craniocervical junction within normal limits. Bone marrow signal intensity normal. No scalp soft tissue abnormality. Sinuses/Orbits: Patient status post bilateral ocular lens replacement. Globes and orbital soft tissues within normal limits. Paranasal sinuses are largely clear. No significant mastoid effusion. Inner ear structures grossly normal. Other: None. IMPRESSION: 1. Subtle 5 mm focus of diffusion signal abnormality involving the left basal ganglia, suspicious for a tiny ischemic infarct given the patient history of right-sided weakness. No associated hemorrhage or mass effect. 2. No other acute intracranial abnormality. 3. Mild chronic microvascular ischemic disease with a few scattered remote lacunar infarcts about the basal ganglia and thalami. Electronically Signed   By: Rise Mu M.D.   On: 10/20/2020 05:49   US Carotid Bilateral  Result Date: 10/21/2020 CLINICAL DATA:  87 year old female with a history of stroke EXAM: BILATERAL CAROTID DUPLEX ULTRASOUND TECHNIQUE: Wallace Cullens scale imaging, color Doppler and duplex ultrasound were performed of bilateral carotid and vertebral arteries in the neck. COMPARISON:  None. FINDINGS: Criteria: Quantification of carotid stenosis is based on velocity parameters that correlate the residual internal carotid diameter with NASCET-based stenosis levels, using the diameter of the distal internal carotid lumen as the denominator for stenosis measurement. The following velocity measurements were obtained: RIGHT ICA:  Systolic 82 cm/sec, Diastolic 19 cm/sec CCA:  60 cm/sec SYSTOLIC ICA/CCA RATIO:  1.4 ECA:  225 cm/sec LEFT ICA:  Systolic 106 cm/sec, Diastolic 19 cm/sec CCA:  75 cm/sec SYSTOLIC ICA/CCA RATIO:  1.4 ECA:  192 cm/sec Right Brachial SBP: Not acquired Left Brachial  SBP: Not acquired RIGHT CAROTID ARTERY: No significant calcifications of the right common carotid artery. Intermediate waveform maintained. Moderate heterogeneous and partially calcified plaque at the right carotid bifurcation. No significant lumen shadowing. Low resistance waveform of the right ICA. No significant tortuosity. RIGHT VERTEBRAL ARTERY: Antegrade flow with low resistance waveform. LEFT CAROTID ARTERY: No significant calcifications of the left common carotid artery. Intermediate waveform maintained. Moderate heterogeneous and partially calcified plaque at the left carotid bifurcation. No significant lumen shadowing. Low resistance waveform of the left ICA. No significant tortuosity. LEFT VERTEBRAL ARTERY:  Antegrade flow with low resistance waveform. IMPRESSION: Color duplex indicates moderate heterogeneous and calcified plaque, with no hemodynamically significant stenosis by duplex criteria in the extracranial cerebrovascular circulation. Signed, Yvone Neu. Reyne Dumas, RPVI [facility] Electronically Signed   By: Gilmer Mor D.O.   On: 10/21/2020 16:03   ECHOCARDIOGRAM COMPLETE BUBBLE STUDY  Result Date: 10/20/2020    ECHOCARDIOGRAM REPORT   Patient Name:   Cassandra Schaefer Date of Exam: 10/20/2020 Medical Rec #:  [id number]       Height:       63.0 in Accession #:    [id number]      Weight:       129.4 lb Date of Birth:  1928-07-03       BSA:          1.607 m Patient Age:    89 years        BP:           168/62 mmHg Patient Gender: F               HR:           83 bpm. Exam Location:  [facility] Procedure: 2D Echo, Cardiac Doppler, Color Doppler and Saline Contrast Bubble            Study Indications:     TIA 434.9 / G45.9  History:         Patient has no prior history of Echocardiogram examinations.                  Risk Factors:Hypertension and Diabetes.  Sonographer:     Cristela Blue RDCS (AE) Referring Phys:  [id number] Angie Fava Diagnosing  Phys: Lorine Bears MD  Sonographer Comments: Suboptimal parasternal window. IMPRESSIONS  1. Left ventricular ejection fraction, by estimation, is 55 to 60%. The left ventricle has normal function. Left ventricular endocardial border not optimally defined to evaluate regional wall motion. There is mild left ventricular hypertrophy. Left ventricular diastolic parameters are indeterminate.  2. Right ventricular systolic function is normal. The right ventricular size is normal. There is mildly elevated pulmonary artery systolic pressure.  3. The mitral valve is normal in structure. Mild mitral valve regurgitation. No evidence of mitral stenosis. Moderate mitral annular calcification.  4. The aortic valve is normal in structure. Aortic valve regurgitation is not visualized. Mild to moderate aortic valve sclerosis/calcification is present, without any evidence of aortic stenosis.  5. The inferior vena cava is normal in size with greater than 50% respiratory variability, suggesting right atrial pressure of 3 mmHg.  6. Agitated saline contrast bubble study was negative, with no evidence of any interatrial shunt. FINDINGS  Left Ventricle: Left ventricular ejection fraction, by estimation, is 55 to 60%. The left ventricle has normal function. Left ventricular endocardial border not optimally defined to evaluate regional wall motion. The left ventricular internal cavity size was normal in size. There is mild left ventricular hypertrophy. Left ventricular diastolic parameters are indeterminate. Right Ventricle: The right ventricular size is normal. No increase in right ventricular wall thickness. Right ventricular systolic function is normal. There is mildly elevated pulmonary artery systolic pressure. The tricuspid regurgitant velocity is 3.18  m/s, and with an assumed right atrial pressure of 3 mmHg, the estimated right ventricular systolic pressure is 43.4 mmHg. Left Atrium: Left atrial size was normal in size. Right  Atrium: Right atrial size was normal in size. Pericardium: There is no evidence of pericardial effusion. Mitral  Valve: The mitral valve is normal in structure. Moderate mitral annular calcification. Mild mitral valve regurgitation. No evidence of mitral valve stenosis. Tricuspid Valve: The tricuspid valve is normal in structure. Tricuspid valve regurgitation is trivial. No evidence of tricuspid stenosis. Aortic Valve: The aortic valve is normal in structure. Aortic valve regurgitation is not visualized. Mild to moderate aortic valve sclerosis/calcification is present, without any evidence of aortic stenosis. Aortic valve mean gradient measures 4.5 mmHg. Aortic valve peak gradient measures 9.4 mmHg. Aortic valve area, by VTI measures 2.66 cm. Pulmonic Valve: The pulmonic valve was normal in structure. Pulmonic valve regurgitation is not visualized. No evidence of pulmonic stenosis. Aorta: The aortic root is normal in size and structure. Venous: The inferior vena cava is normal in size with greater than 50% respiratory variability, suggesting right atrial pressure of 3 mmHg. IAS/Shunts: No atrial level shunt detected by color flow Doppler. Agitated saline contrast was given intravenously to evaluate for intracardiac shunting. Agitated saline contrast bubble study was negative, with no evidence of any interatrial shunt.  LEFT VENTRICLE PLAX 2D LVIDd:         2.75 cm  Diastology LVIDs:         2.02 cm  LV e' medial:    6.31 cm/s LV PW:         1.18 cm  LV E/e' medial:  18.5 LV IVS:        0.90 cm  LV e' lateral:   6.85 cm/s LVOT diam:     2.00 cm  LV E/e' lateral: 17.1 LV SV:         85 LV SV Index:   53 LVOT Area:     3.14 cm  RIGHT VENTRICLE RV Basal diam:  2.97 cm RV S prime:     16.90 cm/s TAPSE (M-mode): 3.2 cm LEFT ATRIUM           Index       RIGHT ATRIUM          Index Cassandra diam:      2.60 cm 1.62 cm/m  RA Area:     7.99 cm Cassandra Vol (A2C): 24.4 ml 15.18 ml/m RA Volume:   13.80 ml 8.59 ml/m Cassandra Vol (A4C): 26.3  ml 16.37 ml/m  AORTIC VALVE                    PULMONIC VALVE AV Area (Vmax):    2.42 cm     PV Vmax:        0.37 m/s AV Area (Vmean):   2.12 cm     PV Peak grad:   0.5 mmHg AV Area (VTI):     2.66 cm     RVOT Peak grad: 1 mmHg AV Vmax:           153.00 cm/s AV Vmean:          101.250 cm/s AV VTI:            0.320 m AV Peak Grad:      9.4 mmHg AV Mean Grad:      4.5 mmHg LVOT Vmax:         118.00 cm/s LVOT Vmean:        68.400 cm/s LVOT VTI:          0.271 m LVOT/AV VTI ratio: 0.85  AORTA Ao Root diam: 2.77 cm MITRAL VALVE                TRICUSPID VALVE MV  Area (PHT): 3.60 cm     TR Peak grad:   40.4 mmHg MV Decel Time: 211 msec     TR Vmax:        318.00 cm/s MV E velocity: 117.00 cm/s MV A velocity: 138.00 cm/s  SHUNTS MV E/A ratio:  0.85         Systemic VTI:  0.27 m                             Systemic Diam: 2.00 cm Lorine Bears MD Electronically signed by Lorine Bears MD Signature Date/Time: 10/20/2020/1:44:50 PM    Final       Assessment/Plan 1.  Left hemispheric stroke.  She has undergone multiple imaging studies which I have independently reviewed.  She had an MRI yesterday which showed a very small left acute stroke.  Unfortunately, her MRI today shows significant enlargement of this left-sided stroke.  It went from about 5 mm to about 15 mm in size.  She had a CT angiogram of the head and neck which I have reviewed.  This is officially interpreted as a 70% left ICA stenosis as well as a left vertebral artery stenosis at its origin.  There was mild right internal carotid artery stenosis as well.  I actually would interpret this as a higher degree of stenosis as it is circumferentially calcified and very dense, but appears to be a fairly high-grade stenosis.  She had a carotid duplex done today which was interpreted as having plaque bilaterally without any significant stenosis, but due to the dense circumferential calcification this tends to create underestimate of the degree of stenosis with  carotid duplex making carotid duplex less helpful. Given her findings on imaging study, it is likely secondary to carotid disease.  This is a difficult situation however as her stroke is evolving, and any acute intervention is likely to worsen the situation due to reperfusion syndrome.  We would generally wait 1 to 2 weeks before considering revascularization.  I talked her daughter about both carotid artery stenting and carotid endarterectomy, but we will need to see how she does in terms of her clinical appearance from her acute stroke.  Would recommend addition of Plavix for dual antiplatelet therapy in addition to her statin agent. 2.  Carotid stenosis.  See #1. 3.  Diabetes.  Stable on outpatient medications and blood glucose control important in reducing the progression of atherosclerotic disease. Also, involved in wound healing. On appropriate medications. 4.  Hypertension. Stable on outpatient medications and blood pressure control important in reducing the progression of atherosclerotic disease. On appropriate oral medications.    Festus Barren, MD  10/21/2020 4:39 PM    This note was created with Dragon medical transcription system.  Any error is purely unintentional

## 2020-10-21 NOTE — Progress Notes (Signed)
Occupational Therapy Treatment Patient Details Name: Cassandra Schaefer MRN: [id number] DOB: 1928-11-01 Today's Date: 10/21/2020    History of present illness 87 y.o. female with medical history significant for type 2 diabetes mellitus, essential hypertension, acquired hypothyroidism, hyperlipidemia, stage III chronic kidney disease with baseline creatinine 1.4-1.7, who is admitted to [facility] on 10/19/2020 with suspected TIA versus acute ischemic CVA after presenting from home to [facility] ED complaining of right-sided weakness. Confirmed L basal ganglia ischemic stroke.   OT comments  Upon entering the room, pt supine in bed with no c/o pain. Pt does report new numbness and tingling throughout R UE. Pt's daughter present in room and reports pt was unable to feed self her lunch without assistance or switching to L UE only. Pt with decreased coordination and lack of control of R UE when bringing hand to her face on command which is very diffferent from yesterday. Pt notes to have R lateral lean while seated EOB. She reports being aware but needing cuing to correct. Pt attempting to doff B socks with use of B UEs and needing multiple attempts to grasp onto sock with R UE in three jaw chuck grasp. She was able to don/doff with increased time and min A for sitting balance. Pt standing with mod A from EOB. Pt wanted to attempt steps without use of RW. OT providing pt with mod A to ambulate 7' within room with A for weight shift and significant time and multimodal cuing for sequencing step. Pt returning back to bed at end of session and remains in good spirits. OT changing therapy recommendation to intensive inpatient rehab program to address functional deficits with new changes. Pt being independent at baseline with hopes to return home and care for herself.   Follow Up Recommendations  Supervision/Assistance - 24 hour;[name]    Equipment Recommendations  Other (comment) (defer to next venue of  care)    Recommendations for Other Services      Precautions / Restrictions Precautions Precautions: Fall Restrictions Weight Bearing Restrictions: No       Mobility Bed Mobility Overal bed mobility: Needs Assistance Bed Mobility: Supine to Sit;Sit to Supine     Supine to sit: Min assist Sit to supine: Min assist   General bed mobility comments: Pt was in recliner pre/post session    Transfers Overall transfer level: Needs assistance Equipment used: None Transfers: Sit to/from [employer] Sit to Stand: Mod assist Stand pivot transfers: Mod assist       General transfer comment: pt needing assistance to weight shift and mod multimodal cuing for sequencing forward step    Balance Overall balance assessment: Needs assistance Sitting-balance support: Feet supported Sitting balance-Leahy Scale: Fair   Postural control: Right lateral lean Standing balance support: Bilateral upper extremity supported;During functional activity Standing balance-Leahy Scale: Poor Standing balance comment: requires constant assistance in standing to prevent falling                           ADL either performed or assessed with clinical judgement   ADL Overall ADL's : Needs assistance/impaired                     Lower Body Dressing: Moderate assistance Lower Body Dressing Details (indicate cue type and reason): don/doff B socks. Difficulty grasping with R UE and R lateral lean while seated attempting to complete tasks.  Vision Baseline Vision/History: Wears glasses Wears Glasses: At all times Patient Visual Report: No change from baseline            Cognition Arousal/Alertness: Awake/alert Behavior During Therapy: WFL for tasks assessed/performed Overall Cognitive Status: Within Functional Limits for tasks assessed                                 General Comments: Pt with difficulty word finding and  slurred speech this session. Pt remains pleasant and cooperative overall.                   Pertinent Vitals/ Pain       Pain Assessment: No/denies pain         Frequency  Min 3X/week        Progress Toward Goals  OT Goals(current goals can now be found in the care plan section)  Progress towards OT goals: Progressing toward goals  Acute Rehab OT Goals Patient Stated Goal: to get better OT Goal Formulation: With patient/family Time For Goal Achievement: 11/03/20 Potential to Achieve Goals: Good  Plan Discharge plan needs to be updated;Frequency needs to be updated       AM-PAC OT "6 Clicks" Daily Activity     Outcome Measure   Help from another person eating meals?: A Lot Help from another person taking care of personal grooming?: A Lot Help from another person toileting, which includes using toliet, bedpan, or urinal?: A Lot Help from another person bathing (including washing, rinsing, drying)?: A Lot Help from another person to put on and taking off regular upper body clothing?: A Lot Help from another person to put on and taking off regular lower body clothing?: A Lot 6 Click Score: 12    End of Session    OT Visit Diagnosis: Unsteadiness on feet (R26.81);Repeated falls (R29.6);Muscle weakness (generalized) (M62.81)   Activity Tolerance Patient tolerated treatment well   Patient Left in bed;with call bell/phone within reach;with bed alarm set;with family/visitor present   Nurse Communication Mobility status;Other (comment) (change in status)        Time: 1331-1400 OT Time Calculation (min): 29 min  Charges: OT General Charges $OT Visit: 1 Visit OT Treatments $Self Care/Home Management : 8-22 mins $Neuromuscular Re-education: 8-22 mins  Jackquline Denmark, MS, OTR/L , CBIS ascom [phone]  10/21/20, 2:08 PM

## 2020-10-21 NOTE — Progress Notes (Addendum)
Subjective: Per PT and ST, the patient has worsened ambulation, motor and speech function since yesterday.   Objective: Current vital signs: BP (!) 144/62 (BP Location: Right Arm)   Pulse 67   Temp 97.9 F (36.6 C)   Resp 20   Ht  (1.6 m)   Wt 55.2 kg   SpO2 98%   BMI 21.56 kg/m  Vital signs in last 24 hours: Temp:  [97.8 F (36.6 C)-98.2 F (36.8 C)] 97.9 F (36.6 C) (06/28 1122) Pulse Rate:  [63-72] 67 (06/28 1122) Resp:  [16-20] 20 (06/28 1122) BP: (133-166)/(52-68) 144/62 (06/28 1122) SpO2:  [94 %-98 %] 98 % (06/28 1122) Weight:  [55.2 kg] 55.2 kg (06/28 0450)  Intake/Output from previous day: 06/27 0701 - 06/28 0700 In: 120 [P.O.:120] Out: -  Intake/Output this shift: Total I/O In: 240 [P.O.:240] Out: -  Nutritional status:  Diet Order             Diet Carb Modified Fluid consistency: Thin; Room service appropriate? Yes  Diet effective now                  HEENT: [place]/AT Lungs: Respirations unlabored Ext: No edema  Neurologic Exam: Ment: Awake but appears fatigued to slightly drowsy with decreased level of alertness relative to yesterday. Fully oriented. Speech is mildly dysarthric but otherwise is fluent with intact comprehension. Can name fingers, a button and a badge. No word finding difficulty noted.  CN: Temporal visual fields intact when tested individually, but there is extinction on the right with DSS. EOMI. Decreased right NL fold, but grimace is symmetric.  Motor: RUE 4/5 proximally and distally RLE 5/5 proximally and distally LUE and LLE 5/5 Cerebellar: Mild ataxia with FNF on the right. Normal on the left.   Lab Results: Results for orders placed or performed during the hospital encounter of 10/19/20 (from the past 48 hour(s))  Protime-INR     Status: None   Collection Time: 10/19/20  9:01 PM  Result Value Ref Range   Prothrombin Time 13.7 11.4 - 15.2 seconds   INR 1.1 0.8 - 1.2    Comment: (NOTE) INR goal varies based on device  and disease states. Performed at [facility], [address] [place], [place] [address]   APTT     Status: None   Collection Time: 10/19/20  9:01 PM  Result Value Ref Range   aPTT 34 24 - 36 seconds    Comment: Performed at [facility], [address] [place], [place] [address]  CBC     Status: Abnormal   Collection Time: 10/19/20  9:01 PM  Result Value Ref Range   WBC 8.1 4.0 - 10.5 K/uL   RBC 3.76 (L) 3.87 - 5.11 MIL/uL   Hemoglobin 11.9 (L) 12.0 - 15.0 g/dL   HCT 46.9 (L) 62.9 - 52.8 %   MCV 94.9 80.0 - 100.0 fL   MCH 31.6 26.0 - 34.0 pg   MCHC 33.3 30.0 - 36.0 g/dL   RDW 41.3 24.4 - 01.0 %   Platelets 242 150 - 400 K/uL   nRBC 0.0 0.0 - 0.2 %    Comment: Performed at [facility], [address] [place], [place] [address]  Differential     Status: Abnormal   Collection Time: 10/19/20  9:01 PM  Result Value Ref Range   Neutrophils Relative % 49 %   Neutro Abs 3.9 1.7 - 7.7 K/uL   Lymphocytes Relative 30 %  Lymphs Abs 2.5 0.7 - 4.0 K/uL   Monocytes Relative 9 %   Monocytes Absolute 0.8 0.1 - 1.0 K/uL   Eosinophils Relative 11 %   Eosinophils Absolute 0.9 (H) 0.0 - 0.5 K/uL   Basophils Relative 1 %   Basophils Absolute 0.1 0.0 - 0.1 K/uL   Immature Granulocytes 0 %   Abs Immature Granulocytes 0.02 0.00 - 0.07 K/uL    Comment: Performed at [facility], [address], [place], [place] [address]  Comprehensive metabolic panel     Status: Abnormal   Collection Time: 10/19/20  9:01 PM  Result Value Ref Range   Sodium 135 135 - 145 mmol/L   Potassium 4.3 3.5 - 5.1 mmol/L   Chloride 107 98 - 111 mmol/L   CO2 20 (L) 22 - 32 mmol/L   Glucose, Bld 245 (H) 70 - 99 mg/dL    Comment: Glucose reference range applies only to samples taken after fasting for at least 8 hours.   BUN 28 (H) 8 - 23 mg/dL   Creatinine, Ser 6.04 (H) 0.44 - 1.00 mg/dL   Calcium 8.9 8.9 - 54.0 mg/dL   Total Protein 7.3 6.5 - 8.1 g/dL   Albumin 4.1 3.5 - 5.0  g/dL   AST 18 15 - 41 U/L   ALT 12 0 - 44 U/L   Alkaline Phosphatase 103 38 - 126 U/L   Total Bilirubin 0.7 0.3 - 1.2 mg/dL   GFR, Estimated 36 (L) >60 mL/min    Comment: (NOTE) Calculated using the CKD-EPI Creatinine Equation (2021)    Anion gap 8 5 - 15    Comment: Performed at [facility], [address] [place], [place] [address]  Hemoglobin A1c     Status: Abnormal   Collection Time: 10/19/20  9:01 PM  Result Value Ref Range   Hgb A1c MFr Bld 7.0 (H) 4.8 - 5.6 %    Comment: (NOTE)         Prediabetes: 5.7 - 6.4         Diabetes: >6.4         Glycemic control for adults with diabetes: <7.0    Mean Plasma Glucose 154 mg/dL    Comment: (NOTE) Performed At: [facility]  [address] [place], [place] [id number] Jolene Schimke MD ZH:[id number]   Lipid panel     Status: Abnormal   Collection Time: 10/19/20  9:01 PM  Result Value Ref Range   Cholesterol 128 0 - 200 mg/dL   Triglycerides 295 (H) <150 mg/dL   HDL 43 >28 mg/dL   Total CHOL/HDL Ratio 3.0 RATIO   VLDL 42 (H) 0 - 40 mg/dL   LDL Cholesterol 43 0 - 99 mg/dL    Comment:        Total Cholesterol/HDL:CHD Risk Coronary Heart Disease Risk Table                     Men   Women  1/2 Average Risk   3.4   3.3  Average Risk       5.0   4.4  2 X Average Risk   9.6   7.1  3 X Average Risk  23.4   11.0        Use the calculated Patient Ratio above and the CHD Risk Table to determine the patient's CHD Risk.        ATP III CLASSIFICATION (LDL):  <100     mg/dL   Optimal  100-129  mg/dL   Near or Above                    Optimal  130-159  mg/dL   Borderline  960-454  mg/dL   High  >098     mg/dL   Very High Performed at [facility], [address] [place], [place] [address]   Magnesium     Status: None   Collection Time: 10/19/20  9:01 PM  Result Value Ref Range   Magnesium 1.9 1.7 - 2.4 mg/dL    Comment: Performed at [facility], [address] [place], [place]  [address]  Resp Panel by RT-PCR (Flu A&B, Covid) Nasopharyngeal Swab     Status: None   Collection Time: 10/19/20 10:26 PM   Specimen: Nasopharyngeal Swab; Nasopharyngeal(NP) swabs in vial transport medium  Result Value Ref Range   SARS Coronavirus 2 by RT PCR NEGATIVE NEGATIVE    Comment: (NOTE) SARS-CoV-2 target nucleic acids are NOT DETECTED.  The SARS-CoV-2 RNA is generally detectable in upper respiratory specimens during the acute phase of infection. The lowest concentration of SARS-CoV-2 viral copies this assay can detect is 138 copies/mL. A negative result does not preclude SARS-Cov-2 infection and should not be used as the sole basis for treatment or other patient management decisions. A negative result may occur with  improper specimen collection/handling, submission of specimen other than nasopharyngeal swab, presence of viral mutation(s) within the areas targeted by this assay, and inadequate number of viral copies(<138 copies/mL). A negative result must be combined with clinical observations, patient history, and epidemiological information. The expected result is Negative.  Fact Sheet for Patients:  BloggerCourse.com  Fact Sheet for Healthcare Providers:  SeriousBroker.it  This test is no t yet approved or cleared by the [place] FDA and  has been authorized for detection and/or diagnosis of SARS-CoV-2 by FDA under an Emergency Use Authorization (EUA). This EUA will remain  in effect (meaning this test can be used) for the duration of the COVID-19 declaration under Section 564(b)(1) of the Act, 21 U.S.C.section 360bbb-3(b)(1), unless the authorization is terminated  or revoked sooner.       Influenza A by PCR NEGATIVE NEGATIVE   Influenza B by PCR NEGATIVE NEGATIVE    Comment: (NOTE) The Xpert Xpress SARS-CoV-2/FLU/RSV plus assay is intended as an aid in the diagnosis of influenza from Nasopharyngeal swab  specimens and should not be used as a sole basis for treatment. Nasal washings and aspirates are unacceptable for Xpert Xpress SARS-CoV-2/FLU/RSV testing.  Fact Sheet for Patients: BloggerCourse.com  Fact Sheet for Healthcare Providers: SeriousBroker.it  This test is not yet approved or cleared by the [place] FDA and has been authorized for detection and/or diagnosis of SARS-CoV-2 by FDA under an Emergency Use Authorization (EUA). This EUA will remain in effect (meaning this test can be used) for the duration of the COVID-19 declaration under Section 564(b)(1) of the Act, 21 U.S.C. section 360bbb-3(b)(1), unless the authorization is terminated or revoked.  Performed at [facility], [address] [place], [place] [address]   Urinalysis, Complete w Microscopic Urine, Clean Catch     Status: Abnormal   Collection Time: 10/19/20 10:26 PM  Result Value Ref Range   Color, Urine STRAW (A) YELLOW   APPearance CLEAR (A) CLEAR   Specific Gravity, Urine 1.009 1.005 - 1.030   pH 5.0 5.0 - 8.0   Glucose, UA NEGATIVE NEGATIVE mg/dL   Hgb urine dipstick NEGATIVE  NEGATIVE   Bilirubin Urine NEGATIVE NEGATIVE   Ketones, ur NEGATIVE NEGATIVE mg/dL   Protein, ur NEGATIVE NEGATIVE mg/dL   Nitrite NEGATIVE NEGATIVE   Leukocytes,Ua TRACE (A) NEGATIVE   RBC / HPF 0-5 0 - 5 RBC/hpf   WBC, UA 0-5 0 - 5 WBC/hpf   Bacteria, UA RARE (A) NONE SEEN   Squamous Epithelial / LPF 0-5 0 - 5    Comment: Performed at [facility], [address] [place], [place] [address]  Urine Culture     Status: Abnormal   Collection Time: 10/19/20 10:26 PM   Specimen: Urine, Random  Result Value Ref Range   Specimen Description      URINE, RANDOM Performed at [facility], [address], [place], [place] [address]    Special Requests      NONE Performed at [facility], [address] [place], [place] [address]     Culture MULTIPLE SPECIES PRESENT, SUGGEST RECOLLECTION (A)    Report Status 10/21/2020 FINAL   CBG monitoring, ED     Status: Abnormal   Collection Time: 10/19/20 10:42 PM  Result Value Ref Range   Glucose-Capillary 167 (H) 70 - 99 mg/dL    Comment: Glucose reference range applies only to samples taken after fasting for at least 8 hours.  Magnesium     Status: None   Collection Time: 10/20/20  4:23 AM  Result Value Ref Range   Magnesium 1.8 1.7 - 2.4 mg/dL    Comment: Performed at [facility], [address] [place], [place] [address]  Comprehensive metabolic panel     Status: Abnormal   Collection Time: 10/20/20  4:23 AM  Result Value Ref Range   Sodium 140 135 - 145 mmol/L   Potassium 4.2 3.5 - 5.1 mmol/L   Chloride 111 98 - 111 mmol/L   CO2 23 22 - 32 mmol/L   Glucose, Bld 144 (H) 70 - 99 mg/dL    Comment: Glucose reference range applies only to samples taken after fasting for at least 8 hours.   BUN 26 (H) 8 - 23 mg/dL   Creatinine, Ser 8.65 (H) 0.44 - 1.00 mg/dL   Calcium 9.0 8.9 - 78.4 mg/dL   Total Protein 6.5 6.5 - 8.1 g/dL   Albumin 3.5 3.5 - 5.0 g/dL   AST 16 15 - 41 U/L   ALT 11 0 - 44 U/L   Alkaline Phosphatase 83 38 - 126 U/L   Total Bilirubin 0.5 0.3 - 1.2 mg/dL   GFR, Estimated 38 (L) >60 mL/min    Comment: (NOTE) Calculated using the CKD-EPI Creatinine Equation (2021)    Anion gap 6 5 - 15    Comment: Performed at [facility], [address] [place], [place] [address]  CBC     Status: Abnormal   Collection Time: 10/20/20  4:23 AM  Result Value Ref Range   WBC 6.7 4.0 - 10.5 K/uL   RBC 3.47 (L) 3.87 - 5.11 MIL/uL   Hemoglobin 10.7 (L) 12.0 - 15.0 g/dL   HCT 52.8 (L) 41.3 - 24.4 %   MCV 94.8 80.0 - 100.0 fL   MCH 30.8 26.0 - 34.0 pg   MCHC 32.5 30.0 - 36.0 g/dL   RDW 01.0 27.2 - 53.6 %   Platelets 206 150 - 400 K/uL   nRBC 0.0 0.0 - 0.2 %    Comment: Performed at [facility], [address] [place], [place] [address]  Glucose, capillary     Status: Abnormal   Collection Time: 10/20/20  8:09 AM  Result Value Ref Range   Glucose-Capillary 128 (H) 70 - 99 mg/dL    Comment: Glucose reference range applies only to samples taken after fasting for at least 8 hours.  Glucose, capillary     Status: Abnormal   Collection Time: 10/20/20  8:40 PM  Result Value Ref Range   Glucose-Capillary 120 (H) 70 - 99 mg/dL    Comment: Glucose reference range applies only to samples taken after fasting for at least 8 hours.  CBC     Status: Abnormal   Collection Time: 10/21/20  4:05 AM  Result Value Ref Range   WBC 12.3 (H) 4.0 - 10.5 K/uL   RBC 3.68 (L) 3.87 - 5.11 MIL/uL   Hemoglobin 11.6 (L) 12.0 - 15.0 g/dL   HCT 37.1 (L) 69.6 - 78.9 %   MCV 94.3 80.0 - 100.0 fL   MCH 31.5 26.0 - 34.0 pg   MCHC 33.4 30.0 - 36.0 g/dL   RDW 38.1 01.7 - 51.0 %   Platelets 220 150 - 400 K/uL   nRBC 0.0 0.0 - 0.2 %    Comment: Performed at [facility], [address], [place], [place] [address]  Basic metabolic panel     Status: Abnormal   Collection Time: 10/21/20  4:05 AM  Result Value Ref Range   Sodium 138 135 - 145 mmol/L   Potassium 4.7 3.5 - 5.1 mmol/L   Chloride 109 98 - 111 mmol/L   CO2 23 22 - 32 mmol/L   Glucose, Bld 141 (H) 70 - 99 mg/dL    Comment: Glucose reference range applies only to samples taken after fasting for at least 8 hours.   BUN 27 (H) 8 - 23 mg/dL   Creatinine, Ser 7.78 (H) 0.44 - 1.00 mg/dL   Calcium 9.0 8.9 - 24.2 mg/dL   GFR, Estimated 33 (L) >60 mL/min    Comment: (NOTE) Calculated using the CKD-EPI Creatinine Equation (2021)    Anion gap 6 5 - 15    Comment: Performed at [facility], [address] [place], [place] [address]  Glucose, capillary     Status: Abnormal   Collection Time: 10/21/20  7:46 AM  Result Value Ref Range   Glucose-Capillary 128 (H) 70 - 99 mg/dL    Comment: Glucose reference range applies only to samples taken after fasting for at least 8 hours.     Recent Results (from the past 240 hour(s))  Resp Panel by RT-PCR (Flu A&B, Covid) Nasopharyngeal Swab     Status: None   Collection Time: 10/19/20 10:26 PM   Specimen: Nasopharyngeal Swab; Nasopharyngeal(NP) swabs in vial transport medium  Result Value Ref Range Status   SARS Coronavirus 2 by RT PCR NEGATIVE NEGATIVE Final    Comment: (NOTE) SARS-CoV-2 target nucleic acids are NOT DETECTED.  The SARS-CoV-2 RNA is generally detectable in upper respiratory specimens during the acute phase of infection. The lowest concentration of SARS-CoV-2 viral copies this assay can detect is 138 copies/mL. A negative result does not preclude SARS-Cov-2 infection and should not be used as the sole basis for treatment or other patient management decisions. A negative result may occur with  improper specimen collection/handling, submission of specimen other than nasopharyngeal swab, presence of viral mutation(s) within the areas targeted by this assay, and inadequate number of viral copies(<138 copies/mL). A negative result must be combined with clinical observations, patient  history, and epidemiological information. The expected result is Negative.  Fact Sheet for Patients:  BloggerCourse.comhttps://www.fda.gov/media/152166/download  Fact Sheet for Healthcare Providers:  SeriousBroker.ithttps://www.fda.gov/media/152162/download  This test is no t yet approved or cleared by the [place] FDA and  has been authorized for detection and/or diagnosis of SARS-CoV-2 by FDA under an Emergency Use Authorization (EUA). This EUA will remain  in effect (meaning this test can be used) for the duration of the COVID-19 declaration under Section 564(b)(1) of the Act, 21 U.S.C.section 360bbb-3(b)(1), unless the authorization is terminated  or revoked sooner.       Influenza A by PCR NEGATIVE NEGATIVE Final   Influenza B by PCR NEGATIVE NEGATIVE Final    Comment: (NOTE) The Xpert Xpress SARS-CoV-2/FLU/RSV plus assay is intended as an  aid in the diagnosis of influenza from Nasopharyngeal swab specimens and should not be used as a sole basis for treatment. Nasal washings and aspirates are unacceptable for Xpert Xpress SARS-CoV-2/FLU/RSV testing.  Fact Sheet for Patients: BloggerCourse.comhttps://www.fda.gov/media/152166/download  Fact Sheet for Healthcare Providers: SeriousBroker.ithttps://www.fda.gov/media/152162/download  This test is not yet approved or cleared by the [place] FDA and has been authorized for detection and/or diagnosis of SARS-CoV-2 by FDA under an Emergency Use Authorization (EUA). This EUA will remain in effect (meaning this test can be used) for the duration of the COVID-19 declaration under Section 564(b)(1) of the Act, 21 U.S.C. section 360bbb-3(b)(1), unless the authorization is terminated or revoked.  Performed at [facility], [address] [place], [place] [address]   Urine Culture     Status: Abnormal   Collection Time: 10/19/20 10:26 PM   Specimen: Urine, Random  Result Value Ref Range Status   Specimen Description   Final    URINE, RANDOM Performed at [facility], [address] [place], [place] [address]    Special Requests   Final    NONE Performed at [facility], [address] [place], [place] [address]    Culture MULTIPLE SPECIES PRESENT, SUGGEST RECOLLECTION (A)  Final   Report Status 10/21/2020 FINAL  Final    Lipid Panel Recent Labs    10/19/20 2101  CHOL 128  TRIG 211*  HDL 43  CHOLHDL 3.0  VLDL 42*  LDLCALC 43    Studies/Results: CT ANGIO HEAD NECK W WO CM  Result Date: 10/19/2020 CLINICAL DATA:  Initial evaluation for acute right-sided weakness, speech difficulty. EXAM: CT ANGIOGRAPHY HEAD AND NECK TECHNIQUE: Multidetector CT imaging of the head and neck was performed using the standard protocol during bolus administration of intravenous contrast. Multiplanar CT image reconstructions and MIPs were obtained to evaluate the vascular anatomy.  Carotid stenosis measurements (when applicable) are obtained utilizing NASCET criteria, using the distal internal carotid diameter as the denominator. CONTRAST:  75mL OMNIPAQUE IOHEXOL 350 MG/ML SOLN COMPARISON:  None. FINDINGS: CT HEAD FINDINGS Brain: Generalized age-related cerebral atrophy. Few small remote lacunar infarcts noted about the right basal ganglia and right thalamus. No acute intracranial hemorrhage. No acute large vessel territory infarct. No mass lesion, midline shift or mass effect. No hydrocephalus or extra-axial fluid collection. Vascular: No hyperdense vessel. Scattered vascular calcifications noted within the carotid siphons. Skull: Scalp soft tissues demonstrate no acute finding. Calvarium intact. Sinuses: Changes of chronic sinusitis noted. No active disease. No mastoid effusion. Orbits:  Globes orbital soft tissues demonstrate no acute finding. CTA NECK FINDINGS Aortic arch: Visualized aortic arch normal caliber with normal 3 vessel morphology. Moderate atheromatous change about the arch and origin of the great  vessels without high-grade stenosis. Right carotid system: Right CCA patent from its origin to the bifurcation without stenosis. Eccentric mixed plaque at the right carotid bulb/proximal right ICA with associated mild 35% stenosis by NASCET criteria. Right ICA patent distally without stenosis, dissection or occlusion. Left carotid system: The CCA patent from its origin to the bifurcation without stenosis. Bulky calcified plaque about the left carotid bulb with associated stenosis of up to 70% by NASCET criteria. Left ICA patent distally without stenosis, dissection or occlusion. Vertebral arteries: Both vertebral arteries arise from subclavian arteries. No significant proximal subclavian artery stenosis. Strongly dominant left vertebral artery with a diffusely hypoplastic right vertebral artery. Atheromatous plaque at the origin of the dominant left vertebral artery with associated  severe stenosis (series 10, image 77). Dominant left vertebral artery otherwise patent within the neck. Focal severe distal right V3 stenosis noted just prior to cranial vault (series 10, image 196). Skeleton: No visible acute osseous finding. No discrete or worrisome osseous lesions. Other neck: No other acute soft tissue abnormality within the neck. No mass or adenopathy. Upper chest: Scattered interlobular septal thickening noted within the visualized lungs, suggesting mild pulmonary interstitial edema. Mildly prominent precarinal node measures up to the upper limits of normal at 1 cm. Visualized upper chest demonstrates no other acute finding. Review of the MIP images confirms the above findings CTA HEAD FINDINGS Anterior circulation: Petrous segments patent bilaterally. Scattered atheromatous change within the carotid siphons without high-grade stenosis. A1 segments patent bilaterally. Normal anterior communicating artery complex. Anterior cerebral arteries patent to their distal aspects without stenosis. No M1 stenosis or occlusion. Normal MCA bifurcations. Distal MCA branches well perfused and symmetric. Posterior circulation: Dominant left vertebral artery widely patent to the vertebrobasilar junction. Right vertebral artery markedly hypoplastic and irregular, but is grossly patent to the vertebrobasilar junction as well. Right PICA origin patent. Left PICA not seen. Basilar patent to its distal aspect without stenosis. Superior cerebral arteries patent bilaterally. Left PCA supplied via the basilar and is widely patent to its distal aspect. Fetal type origin of the right PCA. Atheromatous change throughout the right PCA with associated moderate multifocal P2/P3 stenoses. Venous sinuses: Grossly patent allowing for timing the contrast bolus. Anatomic variants: Fetal type origin of the right PCA.  No aneurysm. Review of the MIP images confirms the above findings IMPRESSION: CT HEAD IMPRESSION: 1. No acute  intracranial abnormality. 2. Few small remote lacunar infarcts about the right basal ganglia and thalamus. CTA HEAD AND NECK IMPRESSION: 1. Negative CTA for emergent large vessel occlusion. 2. Bulky calcified plaque about the left carotid bulb with associated stenosis of up to 70% by NASCET criteria. 3. Atheromatous plaque at the origin of the dominant left vertebral artery with associated severe stenosis. Strongly dominant left vertebral artery otherwise patent within the neck. 4. Atheromatous change throughout the right PCA with associated moderate multifocal P2/P3 stenoses. 5. Mild pulmonary interstitial edema. Electronically Signed   By: Rise Mu M.D.   On: 10/19/2020 22:55   MR BRAIN WO CONTRAST  Result Date: 10/20/2020 CLINICAL DATA:  Initial evaluation for neuro deficit, stroke suspected. Acute right-sided weakness. EXAM: MRI HEAD WITHOUT CONTRAST TECHNIQUE: Multiplanar, multiecho pulse sequences of the brain and surrounding structures were obtained without intravenous contrast. COMPARISON:  Prior CTA from 10/19/2020. FINDINGS: Brain: Mild diffuse prominence of the CSF containing spaces compatible generalized cerebral atrophy. Scattered patchy T2/FLAIR hyperintensity within the periventricular and deep white matter both cerebral hemispheres as well as the pons, most consistent with chronic  small vessel ischemic disease, mild for age. Few scattered remote lacunar infarcts present about the basal ganglia and thalami. There is a subtle punctate 5 mm focus of diffusion signal abnormality involving the left basal ganglia, only seen on coronal DWI sequence (series 7, image 18). Given the patient history of right-sided weakness, findings suspicious for a tiny punctate ischemic infarct. No associated hemorrhage or mass effect. No other diffusion abnormality to suggest acute or subacute ischemia. Gray-white matter differentiation otherwise maintained. No other areas of remote cortical infarction. No  other evidence for acute or chronic intracranial hemorrhage. No mass lesion, midline shift or mass effect. No hydrocephalus or extra-axial fluid collection. Pituitary gland suprasellar region within normal limits. Midline structures intact. Vascular: Major intracranial vascular flow voids are maintained. Skull and upper cervical spine: Craniocervical junction within normal limits. Bone marrow signal intensity normal. No scalp soft tissue abnormality. Sinuses/Orbits: Patient status post bilateral ocular lens replacement. Globes and orbital soft tissues within normal limits. Paranasal sinuses are largely clear. No significant mastoid effusion. Inner ear structures grossly normal. Other: None. IMPRESSION: 1. Subtle 5 mm focus of diffusion signal abnormality involving the left basal ganglia, suspicious for a tiny ischemic infarct given the patient history of right-sided weakness. No associated hemorrhage or mass effect. 2. No other acute intracranial abnormality. 3. Mild chronic microvascular ischemic disease with a few scattered remote lacunar infarcts about the basal ganglia and thalami. Electronically Signed   By: Rise Mu M.D.   On: 10/20/2020 05:49   ECHOCARDIOGRAM COMPLETE BUBBLE STUDY  Result Date: 10/20/2020    ECHOCARDIOGRAM REPORT   Patient Name:   Elener ANN Schirtzinger Date of Exam: 10/20/2020 Medical Rec #:  [id number]       Height:       63.0 in Accession #:    [id number]      Weight:       129.4 lb Date of Birth:  Sep 11, 1928       BSA:          1.607 m Patient Age:    87 years        BP:           168/62 mmHg Patient Gender: F               HR:           83 bpm. Exam Location:  [facility] Procedure: 2D Echo, Cardiac Doppler, Color Doppler and Saline Contrast Bubble            Study Indications:     TIA 434.9 / G45.9  History:         Patient has no prior history of Echocardiogram examinations.                  Risk Factors:Hypertension and Diabetes.  Sonographer:     Cristela Blue RDCS (AE) Referring Phys:   [id number] Angie Fava Diagnosing Phys: Lorine Bears MD  Sonographer Comments: Suboptimal parasternal window. IMPRESSIONS  1. Left ventricular ejection fraction, by estimation, is 55 to 60%. The left ventricle has normal function. Left ventricular endocardial border not optimally defined to evaluate regional wall motion. There is mild left ventricular hypertrophy. Left ventricular diastolic parameters are indeterminate.  2. Right ventricular systolic function is normal. The right ventricular size is normal. There is mildly elevated pulmonary artery systolic pressure.  3. The mitral valve is normal in structure. Mild mitral valve regurgitation. No evidence of mitral stenosis. Moderate mitral annular calcification.  4. The aortic valve  is normal in structure. Aortic valve regurgitation is not visualized. Mild to moderate aortic valve sclerosis/calcification is present, without any evidence of aortic stenosis.  5. The inferior vena cava is normal in size with greater than 50% respiratory variability, suggesting right atrial pressure of 3 mmHg.  6. Agitated saline contrast bubble study was negative, with no evidence of any interatrial shunt. FINDINGS  Left Ventricle: Left ventricular ejection fraction, by estimation, is 55 to 60%. The left ventricle has normal function. Left ventricular endocardial border not optimally defined to evaluate regional wall motion. The left ventricular internal cavity size was normal in size. There is mild left ventricular hypertrophy. Left ventricular diastolic parameters are indeterminate. Right Ventricle: The right ventricular size is normal. No increase in right ventricular wall thickness. Right ventricular systolic function is normal. There is mildly elevated pulmonary artery systolic pressure. The tricuspid regurgitant velocity is 3.18  m/s, and with an assumed right atrial pressure of 3 mmHg, the estimated right ventricular systolic pressure is 43.4 mmHg. Left Atrium: Left atrial  size was normal in size. Right Atrium: Right atrial size was normal in size. Pericardium: There is no evidence of pericardial effusion. Mitral Valve: The mitral valve is normal in structure. Moderate mitral annular calcification. Mild mitral valve regurgitation. No evidence of mitral valve stenosis. Tricuspid Valve: The tricuspid valve is normal in structure. Tricuspid valve regurgitation is trivial. No evidence of tricuspid stenosis. Aortic Valve: The aortic valve is normal in structure. Aortic valve regurgitation is not visualized. Mild to moderate aortic valve sclerosis/calcification is present, without any evidence of aortic stenosis. Aortic valve mean gradient measures 4.5 mmHg. Aortic valve peak gradient measures 9.4 mmHg. Aortic valve area, by VTI measures 2.66 cm. Pulmonic Valve: The pulmonic valve was normal in structure. Pulmonic valve regurgitation is not visualized. No evidence of pulmonic stenosis. Aorta: The aortic root is normal in size and structure. Venous: The inferior vena cava is normal in size with greater than 50% respiratory variability, suggesting right atrial pressure of 3 mmHg. IAS/Shunts: No atrial level shunt detected by color flow Doppler. Agitated saline contrast was given intravenously to evaluate for intracardiac shunting. Agitated saline contrast bubble study was negative, with no evidence of any interatrial shunt.  LEFT VENTRICLE PLAX 2D LVIDd:         2.75 cm  Diastology LVIDs:         2.02 cm  LV e' medial:    6.31 cm/s LV PW:         1.18 cm  LV E/e' medial:  18.5 LV IVS:        0.90 cm  LV e' lateral:   6.85 cm/s LVOT diam:     2.00 cm  LV E/e' lateral: 17.1 LV SV:         85 LV SV Index:   53 LVOT Area:     3.14 cm  RIGHT VENTRICLE RV Basal diam:  2.97 cm RV S prime:     16.90 cm/s TAPSE (M-mode): 3.2 cm LEFT ATRIUM           Index       RIGHT ATRIUM          Index LA diam:      2.60 cm 1.62 cm/m  RA Area:     7.99 cm LA Vol (A2C): 24.4 ml 15.18 ml/m RA Volume:   13.80 ml  8.59 ml/m LA Vol (A4C): 26.3 ml 16.37 ml/m  AORTIC VALVE  PULMONIC VALVE AV Area (Vmax):    2.42 cm     PV Vmax:        0.37 m/s AV Area (Vmean):   2.12 cm     PV Peak grad:   0.5 mmHg AV Area (VTI):     2.66 cm     RVOT Peak grad: 1 mmHg AV Vmax:           153.00 cm/s AV Vmean:          101.250 cm/s AV VTI:            0.320 m AV Peak Grad:      9.4 mmHg AV Mean Grad:      4.5 mmHg LVOT Vmax:         118.00 cm/s LVOT Vmean:        68.400 cm/s LVOT VTI:          0.271 m LVOT/AV VTI ratio: 0.85  AORTA Ao Root diam: 2.77 cm MITRAL VALVE                TRICUSPID VALVE MV Area (PHT): 3.60 cm     TR Peak grad:   40.4 mmHg MV Decel Time: 211 msec     TR Vmax:        318.00 cm/s MV E velocity: 117.00 cm/s MV A velocity: 138.00 cm/s  SHUNTS MV E/A ratio:  0.85         Systemic VTI:  0.27 m                             Systemic Diam: 2.00 cm Lorine Bears MD Electronically signed by Lorine Bears MD Signature Date/Time: 10/20/2020/1:44:50 PM    Final     Medications: Scheduled:  aspirin  81 mg Oral Daily   atorvastatin  80 mg Oral Daily   enoxaparin (LOVENOX) injection  30 mg Subcutaneous Q24H   insulin aspart  0-5 Units Subcutaneous QHS   levothyroxine  88 mcg Oral Q0600   LORazepam       Assessment: 87 y.o. female with a PMHx of anemia, DM, HTN, hypothyroidism, gout, CKD stage III and PUD who presented to the [facility] ED on Sunday evening with chief complaints of right sided weakness, right arm numbness, difficulty ambulating and word finding difficulty. No associated dizziness or headache. Her symptoms began at 1230 on Sunday. She has no prior history of stroke. She was outside of the tPA window at the time of presentation.  1. Exam reveals mild to moderate dysarthria, subtle right facial weakness and RUE mild weakness. Today there are new findings of RUE ataxia and extinction in right visual field with DSS. PT and ST have also noted worsening of her function. Will need MRI brain to further  evaluate.  2. CT head showed no acute abnormality. Few small remote lacunar infarcts about the right basal ganglia and thalamus. 3. CTA of head and neck showed no LVO. Atherosclerotic changes were noted in the intracranial circulation as well as the neck, including bulky calcified plaque about the left carotid bulb with associated stenosis of up to 70% by NASCET criteria. Atheromatous plaque at the origin of the dominant left vertebral artery with associated severe stenosis. Strongly dominant left vertebral artery otherwise patent within the neck. Atheromatous change throughout the right PCA with associated moderate multifocal P2/P3 stenoses. 4. MRI brain: Subtle 5 mm focus of diffusion signal abnormality involving the left basal ganglia, suspicious  for a tiny ischemic infarct given the patient history of right-sided weakness. Mild chronic microvascular ischemic disease with a few scattered remote lacunar infarcts about the basal ganglia and thalami 5. EKG: NSR 6. Given the symptomatic left ICA stenosis, she is a possible candidate for CEA.  7. TTE reveals normal left venticular function with EF of 55 to 60%. No evidence of any interatrial shunt.    Recommendations: 1. HgbA1c, fasting lipid panel 2. ASA 81 mg po qd 3. PT consult, OT consult, Speech consult 4. Telemetry monitoring. If no evidence for atrial fibrillation/flutter, consider 30-day cardiac event monitor at time of discharge for further assessment.  5. Continue statin 6. Frequent neuro checks 7. Risk factor modification 8. Carotid ultrasound followed by Vascular Surgery consult.  9. Now out of the permissive HTN time window. BP management as per standard protocol.  10. Repeat MRI has been ordered.   Addendum: - Carotid ultrasound reveals moderate heterogeneous and calcified plaque, with no hemodynamically significant stenosis by duplex criteria in the extracranial cerebrovascular circulation, per the report. However, per daughter, the  Vascular Surgeon spoke to her today and informed her that the heavily calcified plaque seen on CTA would likely be underestimated by ultrasound and the patient may still be a candidate for CEA. Vascular Surgery has seen the patient in consultation today and is considering CEA vs stenting.   - Repeat MRI reveals significant interval progression of the area of restricted diffusion within the left ventrolateral thalamus consistent with acute infarct, now measuring 15 mm. - Adding Plavix to the patient's regimen. She is now on maximal medical therapy for secondary stroke prevention (DAPT plus high-dose atorvastatin).      LOS: 1 day   @Electronically  signed: Dr. Caryl Pina 10/21/2020  2:30 PM

## 2020-10-21 NOTE — Consult Note (Signed)
Physical Medicine and Rehabilitation Consult Reason for Consult: Left basal ganglia ischemic infarct   HPI: Cassandra BonitoJanet Ann Schaefer is an incredibly high functioning 87 year old woman with PMH of Type 2 DM, essential HTN, acquired hypothyroidism, HLD, stage 3 CKD with baseling creatinine 1.4- 1.7 who is admitted to [facility] on 6/26 with L basal ganglia ischemic stroke. Physical Medicine & Rehabilitation was consulted to assess candidacy for [name].     Review of Systems  Constitutional:  Negative for malaise/fatigue.  HENT: Negative.    Eyes: Negative.   Respiratory: Negative.    Cardiovascular: Negative.   Gastrointestinal: Negative.   Genitourinary: Negative.   Musculoskeletal: Negative.   Skin: Negative.   Neurological:  Positive for speech change.  Endo/Heme/Allergies: Negative.   Psychiatric/Behavioral: Negative.    Past Medical History:  Diagnosis Date   Anemia    Cough    RESOLVING, NO FEVER   Diabetes mellitus without complication (HCC)    Gout    Hypertension    Hypothyroidism    PUD (peptic ulcer disease)    Wears dentures    full upper, partial lower   Past Surgical History:  Procedure Laterality Date   AMPUTATION TOE Left 12/14/2017   Procedure: AMPUTATION TOE-4TH MPJ;  Surgeon: Gwyneth RevelsFowler, Justin, DPM;  [facility];  Service: Podiatry;  Laterality: Left;  IVA LOCAL Diabetic - oral meds   APPENDECTOMY     CATARACT EXTRACTION W/PHACO Right 06/23/2015   Procedure: CATARACT EXTRACTION PHACO AND INTRAOCULAR LENS PLACEMENT (IOC);  Surgeon: Sallee LangeSteven Dingeldein, MD;  Location: [facility] ORS;  Service: Ophthalmology;  Laterality: Right;  US 01:28 AP% 23.4 CDE 36.14 fluid pack lot # [id number]   CATARACT EXTRACTION W/PHACO Left 07/28/2015   Procedure: CATARACT EXTRACTION PHACO AND INTRAOCULAR LENS PLACEMENT (IOC);  Surgeon: Sallee LangeSteven Dingeldein, MD;  Location: [facility] ORS;  Service: Ophthalmology;  Laterality: Left;  US    1:29.7 AP%  24.9 CDE   41.32 fluid casette lot  [id number] H exp 09/23/2016   COONOSCOPY     AND ENDOSCOPY   DILATION AND CURETTAGE OF UTERUS     TONSILLECTOMY     TUBAL LIGATION     Family History  Problem Relation Age of Onset   Breast cancer Neg Hx    Social History:  reports that she quit smoking about 36 years ago. Her smoking use included cigarettes. She has never used smokeless tobacco. She reports that she does not drink alcohol. No history on file for drug use. Allergies:  Allergies  Allergen Reactions   Penicillins Hives   Sulfa Antibiotics Hives   Tetanus Toxoids Swelling   Procaine Other (See Comments)    "went into shock"   Tuberculin Tests Swelling and Rash   Medications Prior to Admission  Medication Sig Dispense Refill   fosinopril (MONOPRIL) 10 MG tablet Take 10 mg by mouth at bedtime.     iron polysaccharides (NIFEREX) 150 MG capsule Take 150 mg by mouth daily.     levothyroxine (SYNTHROID, LEVOTHROID) 88 MCG tablet Take 88 mcg by mouth daily before breakfast.     metFORMIN (GLUCOPHAGE-XR) 500 MG 24 hr tablet Take 500 mg by mouth at bedtime.     Multiple Vitamins-Minerals (ICAPS AREDS 2 PO) Take by mouth 2 (two) times daily. Reported on 06/23/2015     simvastatin (ZOCOR) 20 MG tablet Take 20 mg by mouth at bedtime.     vitamin B-12 (CYANOCOBALAMIN) 1000 MCG tablet Take 2,000 mcg by mouth daily.  glucose blood test strip USE TO TEST BLOOD SUGAR DAILY. DX CODE: E11.9     HYDROcodone-acetaminophen (NORCO) 5-325 MG tablet Take 1 tablet by mouth every 6 (six) hours as needed for moderate pain. (Patient not taking: No sig reported) 20 tablet 0    Home: Home Living Family/patient expects to be discharged to:: Private residence Living Arrangements: Children Available Help at Discharge: Family, Available 24 hours/day Type of Home: House Home Access: Ramped entrance Home Layout: One level Bathroom Shower/Tub: Tub/shower unit Home Equipment: Gilmer Mor - single point Additional Comments: [facility] to senior center 2x[facility]  for [facility].  Lives With: Daughter  Functional History: Prior Function Level of Independence: Independent with assistive device(s) Comments: Pt reports being I with self care tasks, mobility, and IADL tasks. Pt uses SPC for community mobility but not within the home.  Active, enjoys yoga/exercise at senior center 1-2x/week Functional Status:  Mobility: Bed Mobility Overal bed mobility: Needs Assistance Bed Mobility: Supine to Sit, Sit to Supine Supine to sit: Min assist Sit to supine: Min assist General bed mobility comments: Pt was in recliner pre/post session Transfers Overall transfer level: Needs assistance Equipment used: None Transfers: Sit to/from Stand, Stand Pivot Transfers Sit to Stand: Mod assist Stand pivot transfers: Mod assist General transfer comment: pt needing assistance to weight shift and mod multimodal cuing for sequencing forward step Ambulation/Gait Ambulation/Gait assistance: Min assist, Mod assist Gait Distance (Feet): 45 Feet Assistive device: Rolling walker (2 wheeled) Gait Pattern/deviations: Step-to pattern, Scissoring, Staggering left, Staggering right, Ataxic General Gait Details: Pt required use of AD today due to severe balance deficits. unsafe to trial without AD. more assistance required to prevent falling. has ~ 5 episodes of scissoring with intervention to prevent falling. Fatigues extremely quickly. constant vcs throughout for wt shift to allow RLE advancement. does have some ataxic movements. required use of RUE hand splint to maintain grasp on RW. Gait velocity: decreased    ADL: ADL Overall ADL's : Needs assistance/impaired Lower Body Dressing: Moderate assistance Lower Body Dressing Details (indicate cue type and reason): don/doff B socks. Difficulty grasping with R UE and R lateral lean while seated attempting to complete tasks. General ADL Comments: supervision - min A for functional mobility nd self care tasks without use of  AD.  Cognition: Cognition Overall Cognitive Status: Within Functional Limits for tasks assessed Orientation Level: Oriented X4 Cognition Arousal/Alertness: Awake/alert Behavior During Therapy: WFL for tasks assessed/performed Overall Cognitive Status: Within Functional Limits for tasks assessed General Comments: Pt with difficulty word finding and slurred speech this session. Pt remains pleasant and cooperative overall.  Blood pressure (!) 144/62, pulse 67, temperature 97.9 F (36.6 C), resp. rate 20, height 5\' 3"  (1.6 m), weight 55.2 kg, SpO2 98 %. Physical Exam Gen: no distress, normal appearing. 2 very supportive daughters at bedside.  HEENT: oral mucosa pink and moist, NCAT Cardio: Reg rate Chest: normal effort, normal rate of breathing Abd: soft, non-distended Ext: no edema Psych: pleasant, normal affect Skin: intact Neuro: Alert and oriented x3. Dysarthric speech Musculoskeletal: 4/5 right sided strength, 5/5 left sided strength  Results for orders placed or performed during the hospital encounter of 10/19/20 (from the past 24 hour(s))  Glucose, capillary     Status: Abnormal   Collection Time: 10/20/20  8:40 PM  Result Value Ref Range   Glucose-Capillary 120 (H) 70 - 99 mg/dL  CBC     Status: Abnormal   Collection Time: 10/21/20  4:05 AM  Result Value Ref Range  WBC 12.3 (H) 4.0 - 10.5 K/uL   RBC 3.68 (L) 3.87 - 5.11 MIL/uL   Hemoglobin 11.6 (L) 12.0 - 15.0 g/dL   HCT 09.8 (L) 11.9 - 14.7 %   MCV 94.3 80.0 - 100.0 fL   MCH 31.5 26.0 - 34.0 pg   MCHC 33.4 30.0 - 36.0 g/dL   RDW 82.9 56.2 - 13.0 %   Platelets 220 150 - 400 K/uL   nRBC 0.0 0.0 - 0.2 %  Basic metabolic panel     Status: Abnormal   Collection Time: 10/21/20  4:05 AM  Result Value Ref Range   Sodium 138 135 - 145 mmol/L   Potassium 4.7 3.5 - 5.1 mmol/L   Chloride 109 98 - 111 mmol/L   CO2 23 22 - 32 mmol/L   Glucose, Bld 141 (H) 70 - 99 mg/dL   BUN 27 (H) 8 - 23 mg/dL   Creatinine, Ser 8.65 (H)  0.44 - 1.00 mg/dL   Calcium 9.0 8.9 - 78.4 mg/dL   GFR, Estimated 33 (L) >60 mL/min   Anion gap 6 5 - 15  Glucose, capillary     Status: Abnormal   Collection Time: 10/21/20  7:46 AM  Result Value Ref Range   Glucose-Capillary 128 (H) 70 - 99 mg/dL   CT ANGIO HEAD NECK W WO CM  Result Date: 10/19/2020 CLINICAL DATA:  Initial evaluation for acute right-sided weakness, speech difficulty. EXAM: CT ANGIOGRAPHY HEAD AND NECK TECHNIQUE: Multidetector CT imaging of the head and neck was performed using the standard protocol during bolus administration of intravenous contrast. Multiplanar CT image reconstructions and MIPs were obtained to evaluate the vascular anatomy. Carotid stenosis measurements (when applicable) are obtained utilizing NASCET criteria, using the distal internal carotid diameter as the denominator. CONTRAST:  75mL OMNIPAQUE IOHEXOL 350 MG/ML SOLN COMPARISON:  None. FINDINGS: CT HEAD FINDINGS Brain: Generalized age-related cerebral atrophy. Few small remote lacunar infarcts noted about the right basal ganglia and right thalamus. No acute intracranial hemorrhage. No acute large vessel territory infarct. No mass lesion, midline shift or mass effect. No hydrocephalus or extra-axial fluid collection. Vascular: No hyperdense vessel. Scattered vascular calcifications noted within the carotid siphons. Skull: Scalp soft tissues demonstrate no acute finding. Calvarium intact. Sinuses: Changes of chronic sinusitis noted. No active disease. No mastoid effusion. Orbits:  Globes orbital soft tissues demonstrate no acute finding. CTA NECK FINDINGS Aortic arch: Visualized aortic arch normal caliber with normal 3 vessel morphology. Moderate atheromatous change about the arch and origin of the great vessels without high-grade stenosis. Right carotid system: Right CCA patent from its origin to the bifurcation without stenosis. Eccentric mixed plaque at the right carotid bulb/proximal right ICA with associated  mild 35% stenosis by NASCET criteria. Right ICA patent distally without stenosis, dissection or occlusion. Left carotid system: The CCA patent from its origin to the bifurcation without stenosis. Bulky calcified plaque about the left carotid bulb with associated stenosis of up to 70% by NASCET criteria. Left ICA patent distally without stenosis, dissection or occlusion. Vertebral arteries: Both vertebral arteries arise from subclavian arteries. No significant proximal subclavian artery stenosis. Strongly dominant left vertebral artery with a diffusely hypoplastic right vertebral artery. Atheromatous plaque at the origin of the dominant left vertebral artery with associated severe stenosis (series 10, image 77). Dominant left vertebral artery otherwise patent within the neck. Focal severe distal right V3 stenosis noted just prior to cranial vault (series 10, image 196). Skeleton: No visible acute osseous finding. No discrete or  worrisome osseous lesions. Other neck: No other acute soft tissue abnormality within the neck. No mass or adenopathy. Upper chest: Scattered interlobular septal thickening noted within the visualized lungs, suggesting mild pulmonary interstitial edema. Mildly prominent precarinal node measures up to the upper limits of normal at 1 cm. Visualized upper chest demonstrates no other acute finding. Review of the MIP images confirms the above findings CTA HEAD FINDINGS Anterior circulation: Petrous segments patent bilaterally. Scattered atheromatous change within the carotid siphons without high-grade stenosis. A1 segments patent bilaterally. Normal anterior communicating artery complex. Anterior cerebral arteries patent to their distal aspects without stenosis. No M1 stenosis or occlusion. Normal MCA bifurcations. Distal MCA branches well perfused and symmetric. Posterior circulation: Dominant left vertebral artery widely patent to the vertebrobasilar junction. Right vertebral artery markedly  hypoplastic and irregular, but is grossly patent to the vertebrobasilar junction as well. Right PICA origin patent. Left PICA not seen. Basilar patent to its distal aspect without stenosis. Superior cerebral arteries patent bilaterally. Left PCA supplied via the basilar and is widely patent to its distal aspect. Fetal type origin of the right PCA. Atheromatous change throughout the right PCA with associated moderate multifocal P2/P3 stenoses. Venous sinuses: Grossly patent allowing for timing the contrast bolus. Anatomic variants: Fetal type origin of the right PCA.  No aneurysm. Review of the MIP images confirms the above findings IMPRESSION: CT HEAD IMPRESSION: 1. No acute intracranial abnormality. 2. Few small remote lacunar infarcts about the right basal ganglia and thalamus. CTA HEAD AND NECK IMPRESSION: 1. Negative CTA for emergent large vessel occlusion. 2. Bulky calcified plaque about the left carotid bulb with associated stenosis of up to 70% by NASCET criteria. 3. Atheromatous plaque at the origin of the dominant left vertebral artery with associated severe stenosis. Strongly dominant left vertebral artery otherwise patent within the neck. 4. Atheromatous change throughout the right PCA with associated moderate multifocal P2/P3 stenoses. 5. Mild pulmonary interstitial edema. Electronically Signed   By: Rise Mu M.D.   On: 10/19/2020 22:55   MR BRAIN WO CONTRAST  Result Date: 10/21/2020 CLINICAL DATA:  Stroke follow-up. EXAM: MRI HEAD WITHOUT CONTRAST TECHNIQUE: Multiplanar, multiecho pulse sequences of the brain and surrounding structures were obtained without intravenous contrast. COMPARISON:  MRI of the brain October 20, 2020. FINDINGS: Brain: Interval progression of area of restricted diffusion involving the left ventrolateral thalamus, measuring approximately 15 mm, consistent with acute infarct. No hemorrhage, hydrocephalus, extra-axial collection or mass lesion. Remote lacunar infarct in  the right thalamus. Small amount of scattered foci of T2 hyperintensity are seen within the white matter of the cerebral hemispheres and within the pons, nonspecific, most likely related to chronic small vessel ischemia. Mild parenchymal volume loss. Vascular: Absent flow void in the V3 and V4 segments of the right vertebral artery may be related to slow flow versus occlusion. Skull and upper cervical spine: Normal marrow signal. Sinuses/Orbits: Bilateral lens surgery. Paranasal sinuses are clear. Other: None. IMPRESSION: Significant interval progression of the area of restricted diffusion within the left ventrolateral thalamus consistent with acute infarct, now measuring 15 mm. Electronically Signed   By: Baldemar Lenis M.D.   On: 10/21/2020 15:32   MR BRAIN WO CONTRAST  Result Date: 10/20/2020 CLINICAL DATA:  Initial evaluation for neuro deficit, stroke suspected. Acute right-sided weakness. EXAM: MRI HEAD WITHOUT CONTRAST TECHNIQUE: Multiplanar, multiecho pulse sequences of the brain and surrounding structures were obtained without intravenous contrast. COMPARISON:  Prior CTA from 10/19/2020. FINDINGS: Brain: Mild diffuse prominence of  the CSF containing spaces compatible generalized cerebral atrophy. Scattered patchy T2/FLAIR hyperintensity within the periventricular and deep white matter both cerebral hemispheres as well as the pons, most consistent with chronic small vessel ischemic disease, mild for age. Few scattered remote lacunar infarcts present about the basal ganglia and thalami. There is a subtle punctate 5 mm focus of diffusion signal abnormality involving the left basal ganglia, only seen on coronal DWI sequence (series 7, image 18). Given the patient history of right-sided weakness, findings suspicious for a tiny punctate ischemic infarct. No associated hemorrhage or mass effect. No other diffusion abnormality to suggest acute or subacute ischemia. Gray-white matter  differentiation otherwise maintained. No other areas of remote cortical infarction. No other evidence for acute or chronic intracranial hemorrhage. No mass lesion, midline shift or mass effect. No hydrocephalus or extra-axial fluid collection. Pituitary gland suprasellar region within normal limits. Midline structures intact. Vascular: Major intracranial vascular flow voids are maintained. Skull and upper cervical spine: Craniocervical junction within normal limits. Bone marrow signal intensity normal. No scalp soft tissue abnormality. Sinuses/Orbits: Patient status post bilateral ocular lens replacement. Globes and orbital soft tissues within normal limits. Paranasal sinuses are largely clear. No significant mastoid effusion. Inner ear structures grossly normal. Other: None. IMPRESSION: 1. Subtle 5 mm focus of diffusion signal abnormality involving the left basal ganglia, suspicious for a tiny ischemic infarct given the patient history of right-sided weakness. No associated hemorrhage or mass effect. 2. No other acute intracranial abnormality. 3. Mild chronic microvascular ischemic disease with a few scattered remote lacunar infarcts about the basal ganglia and thalami. Electronically Signed   By: Rise Mu M.D.   On: 10/20/2020 05:49   US Carotid Bilateral  Result Date: 10/21/2020 CLINICAL DATA:  87 year old female with a history of stroke EXAM: BILATERAL CAROTID DUPLEX ULTRASOUND TECHNIQUE: Wallace Cullens scale imaging, color Doppler and duplex ultrasound were performed of bilateral carotid and vertebral arteries in the neck. COMPARISON:  None. FINDINGS: Criteria: Quantification of carotid stenosis is based on velocity parameters that correlate the residual internal carotid diameter with NASCET-based stenosis levels, using the diameter of the distal internal carotid lumen as the denominator for stenosis measurement. The following velocity measurements were obtained: RIGHT ICA:  Systolic 82 cm/sec, Diastolic  19 cm/sec CCA:  60 cm/sec SYSTOLIC ICA/CCA RATIO:  1.4 ECA:  225 cm/sec LEFT ICA:  Systolic 106 cm/sec, Diastolic 19 cm/sec CCA:  75 cm/sec SYSTOLIC ICA/CCA RATIO:  1.4 ECA:  192 cm/sec Right Brachial SBP: Not acquired Left Brachial SBP: Not acquired RIGHT CAROTID ARTERY: No significant calcifications of the right common carotid artery. Intermediate waveform maintained. Moderate heterogeneous and partially calcified plaque at the right carotid bifurcation. No significant lumen shadowing. Low resistance waveform of the right ICA. No significant tortuosity. RIGHT VERTEBRAL ARTERY: Antegrade flow with low resistance waveform. LEFT CAROTID ARTERY: No significant calcifications of the left common carotid artery. Intermediate waveform maintained. Moderate heterogeneous and partially calcified plaque at the left carotid bifurcation. No significant lumen shadowing. Low resistance waveform of the left ICA. No significant tortuosity. LEFT VERTEBRAL ARTERY:  Antegrade flow with low resistance waveform. IMPRESSION: Color duplex indicates moderate heterogeneous and calcified plaque, with no hemodynamically significant stenosis by duplex criteria in the extracranial cerebrovascular circulation. Signed, Yvone Neu. Reyne Dumas, RPVI [facility] Electronically Signed   By: Gilmer Mor D.O.   On: 10/21/2020 16:03   ECHOCARDIOGRAM COMPLETE BUBBLE STUDY  Result Date: 10/20/2020    ECHOCARDIOGRAM REPORT   Patient Name:  Cassandra Schaefer Date of Exam: 10/20/2020 Medical Rec #:  [id number]       Height:       63.0 in Accession #:    [id number]      Weight:       129.4 lb Date of Birth:  [birth year]       BSA:          1.607 m Patient Age:    87 years        BP:           168/62 mmHg Patient Gender: F               HR:           83 bpm. Exam Location:  [facility] Procedure: 2D Echo, Cardiac Doppler, Color Doppler and Saline Contrast Bubble            Study Indications:     TIA 434.9 /  G45.9  History:         Patient has no prior history of Echocardiogram examinations.                  Risk Factors:Hypertension and Diabetes.  Sonographer:     Cristela Blue RDCS (AE) Referring Phys:  [id number] Angie Fava Diagnosing Phys: Lorine Bears MD  Sonographer Comments: Suboptimal parasternal window. IMPRESSIONS  1. Left ventricular ejection fraction, by estimation, is 55 to 60%. The left ventricle has normal function. Left ventricular endocardial border not optimally defined to evaluate regional wall motion. There is mild left ventricular hypertrophy. Left ventricular diastolic parameters are indeterminate.  2. Right ventricular systolic function is normal. The right ventricular size is normal. There is mildly elevated pulmonary artery systolic pressure.  3. The mitral valve is normal in structure. Mild mitral valve regurgitation. No evidence of mitral stenosis. Moderate mitral annular calcification.  4. The aortic valve is normal in structure. Aortic valve regurgitation is not visualized. Mild to moderate aortic valve sclerosis/calcification is present, without any evidence of aortic stenosis.  5. The inferior vena cava is normal in size with greater than 50% respiratory variability, suggesting right atrial pressure of 3 mmHg.  6. Agitated saline contrast bubble study was negative, with no evidence of any interatrial shunt. FINDINGS  Left Ventricle: Left ventricular ejection fraction, by estimation, is 55 to 60%. The left ventricle has normal function. Left ventricular endocardial border not optimally defined to evaluate regional wall motion. The left ventricular internal cavity size was normal in size. There is mild left ventricular hypertrophy. Left ventricular diastolic parameters are indeterminate. Right Ventricle: The right ventricular size is normal. No increase in right ventricular wall thickness. Right ventricular systolic function is normal. There is mildly elevated pulmonary artery systolic  pressure. The tricuspid regurgitant velocity is 3.18  m/s, and with an assumed right atrial pressure of 3 mmHg, the estimated right ventricular systolic pressure is 43.4 mmHg. Left Atrium: Left atrial size was normal in size. Right Atrium: Right atrial size was normal in size. Pericardium: There is no evidence of pericardial effusion. Mitral Valve: The mitral valve is normal in structure. Moderate mitral annular calcification. Mild mitral valve regurgitation. No evidence of mitral valve stenosis. Tricuspid Valve: The tricuspid valve is normal in structure. Tricuspid valve regurgitation is trivial. No evidence of tricuspid stenosis. Aortic Valve: The aortic valve is normal in structure. Aortic valve regurgitation is not visualized. Mild to moderate aortic valve sclerosis/calcification is present, without any evidence of aortic stenosis. Aortic valve mean gradient measures 4.5  mmHg. Aortic valve peak gradient measures 9.4 mmHg. Aortic valve area, by VTI measures 2.66 cm. Pulmonic Valve: The pulmonic valve was normal in structure. Pulmonic valve regurgitation is not visualized. No evidence of pulmonic stenosis. Aorta: The aortic root is normal in size and structure. Venous: The inferior vena cava is normal in size with greater than 50% respiratory variability, suggesting right atrial pressure of 3 mmHg. IAS/Shunts: No atrial level shunt detected by color flow Doppler. Agitated saline contrast was given intravenously to evaluate for intracardiac shunting. Agitated saline contrast bubble study was negative, with no evidence of any interatrial shunt.  LEFT VENTRICLE PLAX 2D LVIDd:         2.75 cm  Diastology LVIDs:         2.02 cm  LV e' medial:    6.31 cm/s LV PW:         1.18 cm  LV E/e' medial:  18.5 LV IVS:        0.90 cm  LV e' lateral:   6.85 cm/s LVOT diam:     2.00 cm  LV E/e' lateral: 17.1 LV SV:         85 LV SV Index:   53 LVOT Area:     3.14 cm  RIGHT VENTRICLE RV Basal diam:  2.97 cm RV S prime:     16.90  cm/s TAPSE (M-mode): 3.2 cm LEFT ATRIUM           Index       RIGHT ATRIUM          Index LA diam:      2.60 cm 1.62 cm/m  RA Area:     7.99 cm LA Vol (A2C): 24.4 ml 15.18 ml/m RA Volume:   13.80 ml 8.59 ml/m LA Vol (A4C): 26.3 ml 16.37 ml/m  AORTIC VALVE                    PULMONIC VALVE AV Area (Vmax):    2.42 cm     PV Vmax:        0.37 m/s AV Area (Vmean):   2.12 cm     PV Peak grad:   0.5 mmHg AV Area (VTI):     2.66 cm     RVOT Peak grad: 1 mmHg AV Vmax:           153.00 cm/s AV Vmean:          101.250 cm/s AV VTI:            0.320 m AV Peak Grad:      9.4 mmHg AV Mean Grad:      4.5 mmHg LVOT Vmax:         118.00 cm/s LVOT Vmean:        68.400 cm/s LVOT VTI:          0.271 m LVOT/AV VTI ratio: 0.85  AORTA Ao Root diam: 2.77 cm MITRAL VALVE                TRICUSPID VALVE MV Area (PHT): 3.60 cm     TR Peak grad:   40.4 mmHg MV Decel Time: 211 msec     TR Vmax:        318.00 cm/s MV E velocity: 117.00 cm/s MV A velocity: 138.00 cm/s  SHUNTS MV E/A ratio:  0.85         Systemic VTI:  0.27 m  Systemic Diam: 2.00 cm Lorine Bears MD Electronically signed by Lorine Bears MD Signature Date/Time: 10/20/2020/1:44:50 PM    Final      Assessment/Plan: Diagnosis: L basal ganglia infarction Does the need for close, 24 hr/day medical supervision in concert with the patient's rehab needs make it unreasonable for this patient to be served in a less intensive setting? Yes Co-Morbidities requiring supervision/potential complications: Type 2 DM, stage III CKD, essential HTN, hypothyroidism, HLD Due to bladder management, bowel management, safety, skin/wound care, disease management, medication administration, pain management, and patient education, does the patient require 24 hr/day rehab nursing? Yes Does the patient require coordinated care of a physician, rehab nurse, therapy disciplines of PT, OT, SLP to address physical and functional deficits in the context of the above medical  diagnosis(es)? Yes Addressing deficits in the following areas: balance, endurance, locomotion, strength, transferring, bowel/bladder control, bathing, dressing, feeding, grooming, toileting, speech, and psychosocial support Can the patient actively participate in an intensive therapy program of at least 3 hrs of therapy per day at least 5 days per week? Yes The potential for patient to make measurable gains while on inpatient rehab is excellent Anticipated functional outcomes upon discharge from inpatient rehab are supervision  with PT, supervision with OT, supervision with SLP. Estimated rehab length of stay to reach the above functional goals is: 7 days Anticipated discharge destination: Home Overall Rehab/Functional Prognosis: excellent  RECOMMENDATIONS: This patient's condition is appropriate for continued rehabilitative care in the following setting: [name] Patient has agreed to participate in recommended program. Yes Note that insurance prior authorization may be required for reimbursement for recommended care.  Comment: She would be an excellent candidate and incredibly supportive family to help her on discharge. Incredibly high functioning for her age! Thank you for this consult. Admission coordinator to follow.   I have personally performed a face to face diagnostic evaluation, including, but not limited to relevant history and physical exam findings, of this patient and developed relevant assessment and plan.  Additionally, I have reviewed and concur with the physician assistant's documentation above.  Sula Soda, MD

## 2020-10-21 NOTE — Progress Notes (Addendum)
Inpatient Rehabilitation Admissions Coordinator    Inpatient rehab consult received. I contacted patient daughter, [name], by phone at bedside. We discussed goals and expectations of a possible [name] admit. Daughter does not want SNF. Prefers [name] admit. If [employer] does not approve [name], she will pursue d/c home with [facility] and family support. I have updated acute team and TOC, Elena RN CM. I await OT and SLP updates to be able to pursue insurance approval with [facility] for a possible [name] admit at [facility] [place]. Patient would benefit from [name] admit and is a good candidate with family supports.  Ottie Glazier, RN, MSN Rehab Admissions Coordinator [phone] 10/21/2020 12:03 PM

## 2020-10-21 NOTE — TOC Progression Note (Signed)
Transition of Care [facility]) - Progression Note    Patient Details  Name: Cassandra Schaefer MRN: [id number] Date of Birth: 10/12/1928  [facility]) CM/SW Contact  Caryn Section, RN Phone Number: 10/21/2020, 10:22 AM  Clinical Narrative:   Recommendation from PT changed to [name].  Caitlin from [facility] made aware patient will need an evaluation.  Awaiting response from [name].  TOC to follow to discharge.         Expected Discharge Plan and Services                                                 Social Determinants of Health (SDOH) Interventions    Readmission Risk Interventions No flowsheet data found.

## 2020-10-21 NOTE — H&P (View-Only) (Signed)
[facility] Vascular Consult Note  MRN : [id number]  Cassandra Schaefer is a 87 y.o. (02/13/1929) female who presents with chief complaint of  Chief Complaint  Patient presents with   Cerebrovascular Accident  .  History of Present Illness: I am asked to see the patient by Dr. Wouk for evaluation of carotid stenosis with recent stroke.  The patient presented with some right arm weakness and discoordination but originally was fairly mild but over the past 24 hours has evolved and become much more pronounced.  She is also much more lethargic.  She had no previous history of TIA or stroke.  Her daughter provides the history today.  She has undergone multiple imaging studies which I have independently reviewed.  She had an MRI yesterday which showed a very small left acute stroke.  Unfortunately, her MRI today shows significant enlargement of this left-sided stroke.  It went from about 5 mm to about 15 mm in size.  She had a CT angiogram of the head and neck which I have reviewed.  This is officially interpreted as a 70% left ICA stenosis as well as a left vertebral artery stenosis at its origin.  There was mild right internal carotid artery stenosis as well.  I actually would interpret this as a higher degree of stenosis as it is circumferentially calcified and very dense, but appears to be a fairly high-grade stenosis.  She had a carotid duplex done today which was interpreted as having plaque bilaterally without any significant stenosis, but due to the dense circumferential calcification this tends to create underestimate of the degree of stenosis with carotid duplex making carotid duplex less helpful.  Current Facility-Administered Medications  Medication Dose Route Frequency Provider Last Rate Last Admin   acetaminophen (TYLENOL) tablet 650 mg  650 mg Oral Q6H PRN Howerter, Justin B, DO       Or   acetaminophen (TYLENOL) suppository 650 mg  650 mg Rectal Q6H PRN Howerter,  Justin B, DO       aspirin chewable tablet 81 mg  81 mg Oral Daily Howerter, Justin B, DO   81 mg at 10/21/20 0921   atorvastatin (LIPITOR) tablet 80 mg  80 mg Oral Daily Wouk, Noah Bedford, MD   80 mg at 10/21/20 0920   clopidogrel (PLAVIX) tablet 75 mg  75 mg Oral Daily Lindzen, Eric, MD       enoxaparin (LOVENOX) injection 30 mg  30 mg Subcutaneous Q24H Wouk, Noah Bedford, MD   30 mg at 10/21/20 0921   hydrALAZINE (APRESOLINE) injection 10 mg  10 mg Intravenous Q4H PRN Howerter, Justin B, DO       insulin aspart (novoLOG) injection 0-5 Units  0-5 Units Subcutaneous QHS Howerter, Justin B, DO       levothyroxine (SYNTHROID) tablet 88 mcg  88 mcg Oral Q0600 Howerter, Justin B, DO   88 mcg at 10/21/20 0503   LORazepam (ATIVAN) 2 MG/ML injection             Past Medical History:  Diagnosis Date   Anemia    Cough    RESOLVING, NO FEVER   Diabetes mellitus without complication (HCC)    Gout    Hypertension    Hypothyroidism    PUD (peptic ulcer disease)    Wears dentures    full upper, partial lower    Past Surgical History:  Procedure Laterality Date   AMPUTATION TOE Left 12/14/2017   Procedure: AMPUTATION TOE-4TH MPJ;    Surgeon: Fowler, Justin, DPM;  Location: [facility];  Service: Podiatry;  Laterality: Left;  IVA LOCAL Diabetic - oral meds   APPENDECTOMY     CATARACT EXTRACTION W/PHACO Right 06/23/2015   Procedure: CATARACT EXTRACTION PHACO AND INTRAOCULAR LENS PLACEMENT (IOC);  Surgeon: Steven Dingeldein, MD;  Location: [facility];  Service: Ophthalmology;  Laterality: Right;  US 01:28 AP% 23.4 CDE 36.14 fluid pack lot # [id number]   CATARACT EXTRACTION W/PHACO Left 07/28/2015   Procedure: CATARACT EXTRACTION PHACO AND INTRAOCULAR LENS PLACEMENT (IOC);  Surgeon: Steven Dingeldein, MD;  Location: [facility];  Service: Ophthalmology;  Laterality: Left;  US    1:29.7 AP%  24.9 CDE   41.32 fluid casette lot #[id number] exp 09/23/2016   COONOSCOPY     AND ENDOSCOPY   DILATION AND  CURETTAGE OF UTERUS     TONSILLECTOMY     TUBAL LIGATION       Social History   Tobacco Use   Smoking status: Former    Pack years: 0.00    Types: Cigarettes    Quit date: 12/26/1983    Years since quitting: 36.8   Smokeless tobacco: Never  Vaping Use   Vaping Use: Never used  Substance Use Topics   Alcohol use: No     Family History  Problem Relation Age of Onset   Breast cancer Neg Hx   No bleeding disorders, clotting disorders, or aneurysms  Allergies  Allergen Reactions   Penicillins Hives   Sulfa Antibiotics Hives   Tetanus Toxoids Swelling   Procaine Other (See Comments)    "went into shock"   Tuberculin Tests Swelling and Rash     REVIEW OF SYSTEMS (Negative unless checked)  Constitutional: []Weight loss  []Fever  []Chills Cardiac: []Chest pain   []Chest pressure   []Palpitations   []Shortness of breath when laying flat   []Shortness of breath at rest   []Shortness of breath with exertion. Vascular:  []Pain in legs with walking   []Pain in legs at rest   []Pain in legs when laying flat   []Claudication   []Pain in feet when walking  []Pain in feet at rest  []Pain in feet when laying flat   []History of DVT   []Phlebitis   []Swelling in legs   []Varicose veins   []Non-healing ulcers Pulmonary:   []Uses home oxygen   []Productive cough   []Hemoptysis   []Wheeze  []COPD   []Asthma Neurologic:  []Dizziness  []Blackouts   []Seizures   [x]History of stroke   []History of TIA  []Aphasia   []Temporary blindness   []Dysphagia   [x]Weakness or numbness in arms   []Weakness or numbness in legs Musculoskeletal:  [x]Arthritis   []Joint swelling   []Joint pain   []Low back pain Hematologic:  []Easy bruising  []Easy bleeding   []Hypercoagulable state   [x]Anemic  []Hepatitis Gastrointestinal:  []Blood in stool   []Vomiting blood  []Gastroesophageal reflux/heartburn   []Difficulty swallowing. Genitourinary:  []Chronic kidney disease   []Difficult urination  []Frequent urination   []Burning with urination   []Blood in urine Skin:  []Rashes   []Ulcers   []Wounds Psychological:  []History of anxiety   [] History of major depression.  Physical Examination  Vitals:   10/21/20 0349 10/21/20 0450 10/21/20 0745 10/21/20 1122  BP: 140/63  (!) 133/57 (!) 144/62  Pulse: 64  63 67  Resp: 16  16 20  Temp: 97.8 F (36.6 C)  97.9 F (36.6 C) 97.9 F (36.6 C)    TempSrc:      SpO2: 95%  96% 98%  Weight:  55.2 kg    Height:       Body mass index is 21.56 kg/m. Gen:  WD/WN, NAD.  Appears younger than stated age Head: [place]/AT, No temporalis wasting.  Ear/Nose/Throat: Hearing grossly intact, nares w/o erythema or drainage, oropharynx w/o Erythema/Exudate Eyes: Sclera non-icteric, conjunctiva clear Neck: Trachea midline.  No JVD.  Pulmonary:  Good air movement, respirations not labored, equal bilaterally.  Cardiac: RRR, normal S1, S2. Vascular:  Vessel Right Left  Radial Palpable Palpable   Musculoskeletal: M/S 5/5 throughout.  Extremities without ischemic changes.  No deformity or atrophy. No edema. Neurologic: Patient is somnolent.  Right-sided weakness and discoordination in the arm is prominent. Psychiatric: Judgment and insight are difficult to discern due to her changes from stroke. Dermatologic: No rashes or ulcers noted.  No cellulitis or open wounds.      CBC Lab Results  Component Value Date   WBC 12.3 (H) 10/21/2020   HGB 11.6 (L) 10/21/2020   HCT 34.7 (L) 10/21/2020   MCV 94.3 10/21/2020   PLT 220 10/21/2020    BMET    Component Value Date/Time   NA 138 10/21/2020 0405   K 4.7 10/21/2020 0405   CL 109 10/21/2020 0405   CO2 23 10/21/2020 0405   GLUCOSE 141 (H) 10/21/2020 0405   BUN 27 (H) 10/21/2020 0405   CREATININE 1.51 (H) 10/21/2020 0405   CALCIUM 9.0 10/21/2020 0405   GFRNONAA 33 (L) 10/21/2020 0405   Estimated Creatinine Clearance: 20.5 mL/min (A) (by C-G formula based on SCr of 1.51 mg/dL (H)).  COAG Lab Results  Component Value  Date   INR 1.1 10/19/2020    Radiology CT ANGIO HEAD NECK W WO CM  Result Date: 10/19/2020 CLINICAL DATA:  Initial evaluation for acute right-sided weakness, speech difficulty. EXAM: CT ANGIOGRAPHY HEAD AND NECK TECHNIQUE: Multidetector CT imaging of the head and neck was performed using the standard protocol during bolus administration of intravenous contrast. Multiplanar CT image reconstructions and MIPs were obtained to evaluate the vascular anatomy. Carotid stenosis measurements (when applicable) are obtained utilizing NASCET criteria, using the distal internal carotid diameter as the denominator. CONTRAST:  75mL OMNIPAQUE IOHEXOL 350 MG/ML SOLN COMPARISON:  None. FINDINGS: CT HEAD FINDINGS Brain: Generalized age-related cerebral atrophy. Few small remote lacunar infarcts noted about the right basal ganglia and right thalamus. No acute intracranial hemorrhage. No acute large vessel territory infarct. No mass lesion, midline shift or mass effect. No hydrocephalus or extra-axial fluid collection. Vascular: No hyperdense vessel. Scattered vascular calcifications noted within the carotid siphons. Skull: Scalp soft tissues demonstrate no acute finding. Calvarium intact. Sinuses: Changes of chronic sinusitis noted. No active disease. No mastoid effusion. Orbits:  Globes orbital soft tissues demonstrate no acute finding. CTA NECK FINDINGS Aortic arch: Visualized aortic arch normal caliber with normal 3 vessel morphology. Moderate atheromatous change about the arch and origin of the great vessels without high-grade stenosis. Right carotid system: Right CCA patent from its origin to the bifurcation without stenosis. Eccentric mixed plaque at the right carotid bulb/proximal right ICA with associated mild 35% stenosis by NASCET criteria. Right ICA patent distally without stenosis, dissection or occlusion. Left carotid system: The CCA patent from its origin to the bifurcation without stenosis. Bulky calcified plaque  about the left carotid bulb with associated stenosis of up to 70% by NASCET criteria. Left ICA patent distally without stenosis, dissection or occlusion. Vertebral arteries: Both vertebral arteries   arise from subclavian arteries. No significant proximal subclavian artery stenosis. Strongly dominant left vertebral artery with a diffusely hypoplastic right vertebral artery. Atheromatous plaque at the origin of the dominant left vertebral artery with associated severe stenosis (series 10, image 77). Dominant left vertebral artery otherwise patent within the neck. Focal severe distal right V3 stenosis noted just prior to cranial vault (series 10, image 196). Skeleton: No visible acute osseous finding. No discrete or worrisome osseous lesions. Other neck: No other acute soft tissue abnormality within the neck. No mass or adenopathy. Upper chest: Scattered interlobular septal thickening noted within the visualized lungs, suggesting mild pulmonary interstitial edema. Mildly prominent precarinal node measures up to the upper limits of normal at 1 cm. Visualized upper chest demonstrates no other acute finding. Review of the MIP images confirms the above findings CTA HEAD FINDINGS Anterior circulation: Petrous segments patent bilaterally. Scattered atheromatous change within the carotid siphons without high-grade stenosis. A1 segments patent bilaterally. Normal anterior communicating artery complex. Anterior cerebral arteries patent to their distal aspects without stenosis. No M1 stenosis or occlusion. Normal MCA bifurcations. Distal MCA branches well perfused and symmetric. Posterior circulation: Dominant left vertebral artery widely patent to the vertebrobasilar junction. Right vertebral artery markedly hypoplastic and irregular, but is grossly patent to the vertebrobasilar junction as well. Right PICA origin patent. Left PICA not seen. Basilar patent to its distal aspect without stenosis. Superior cerebral arteries patent  bilaterally. Left PCA supplied via the basilar and is widely patent to its distal aspect. Fetal type origin of the right PCA. Atheromatous change throughout the right PCA with associated moderate multifocal P2/P3 stenoses. Venous sinuses: Grossly patent allowing for timing the contrast bolus. Anatomic variants: Fetal type origin of the right PCA.  No aneurysm. Review of the MIP images confirms the above findings IMPRESSION: CT HEAD IMPRESSION: 1. No acute intracranial abnormality. 2. Few small remote lacunar infarcts about the right basal ganglia and thalamus. CTA HEAD AND NECK IMPRESSION: 1. Negative CTA for emergent large vessel occlusion. 2. Bulky calcified plaque about the left carotid bulb with associated stenosis of up to 70% by NASCET criteria. 3. Atheromatous plaque at the origin of the dominant left vertebral artery with associated severe stenosis. Strongly dominant left vertebral artery otherwise patent within the neck. 4. Atheromatous change throughout the right PCA with associated moderate multifocal P2/P3 stenoses. 5. Mild pulmonary interstitial edema. Electronically Signed   By: Benjamin  McClintock M.D.   On: 10/19/2020 22:55   MR BRAIN WO CONTRAST  Result Date: 10/21/2020 CLINICAL DATA:  Stroke follow-up. EXAM: MRI HEAD WITHOUT CONTRAST TECHNIQUE: Multiplanar, multiecho pulse sequences of the brain and surrounding structures were obtained without intravenous contrast. COMPARISON:  MRI of the brain October 20, 2020. FINDINGS: Brain: Interval progression of area of restricted diffusion involving the left ventrolateral thalamus, measuring approximately 15 mm, consistent with acute infarct. No hemorrhage, hydrocephalus, extra-axial collection or mass lesion. Remote lacunar infarct in the right thalamus. Small amount of scattered foci of T2 hyperintensity are seen within the white matter of the cerebral hemispheres and within the pons, nonspecific, most likely related to chronic small vessel ischemia.  Mild parenchymal volume loss. Vascular: Absent flow void in the V3 and V4 segments of the right vertebral artery may be related to slow flow versus occlusion. Skull and upper cervical spine: Normal marrow signal. Sinuses/Orbits: Bilateral lens surgery. Paranasal sinuses are clear. Other: None. IMPRESSION: Significant interval progression of the area of restricted diffusion within the left ventrolateral thalamus consistent with acute infarct, now   measuring 15 mm. Electronically Signed   By: Katyucia  De Macedo Rodrigues M.D.   On: 10/21/2020 15:32   MR BRAIN WO CONTRAST  Result Date: 10/20/2020 CLINICAL DATA:  Initial evaluation for neuro deficit, stroke suspected. Acute right-sided weakness. EXAM: MRI HEAD WITHOUT CONTRAST TECHNIQUE: Multiplanar, multiecho pulse sequences of the brain and surrounding structures were obtained without intravenous contrast. COMPARISON:  Prior CTA from 10/19/2020. FINDINGS: Brain: Mild diffuse prominence of the CSF containing spaces compatible generalized cerebral atrophy. Scattered patchy T2/FLAIR hyperintensity within the periventricular and deep white matter both cerebral hemispheres as well as the pons, most consistent with chronic small vessel ischemic disease, mild for age. Few scattered remote lacunar infarcts present about the basal ganglia and thalami. There is a subtle punctate 5 mm focus of diffusion signal abnormality involving the left basal ganglia, only seen on coronal DWI sequence (series 7, image 18). Given the patient history of right-sided weakness, findings suspicious for a tiny punctate ischemic infarct. No associated hemorrhage or mass effect. No other diffusion abnormality to suggest acute or subacute ischemia. Gray-white matter differentiation otherwise maintained. No other areas of remote cortical infarction. No other evidence for acute or chronic intracranial hemorrhage. No mass lesion, midline shift or mass effect. No hydrocephalus or extra-axial fluid  collection. Pituitary gland suprasellar region within normal limits. Midline structures intact. Vascular: Major intracranial vascular flow voids are maintained. Skull and upper cervical spine: Craniocervical junction within normal limits. Bone marrow signal intensity normal. No scalp soft tissue abnormality. Sinuses/Orbits: Patient status post bilateral ocular lens replacement. Globes and orbital soft tissues within normal limits. Paranasal sinuses are largely clear. No significant mastoid effusion. Inner ear structures grossly normal. Other: None. IMPRESSION: 1. Subtle 5 mm focus of diffusion signal abnormality involving the left basal ganglia, suspicious for a tiny ischemic infarct given the patient history of right-sided weakness. No associated hemorrhage or mass effect. 2. No other acute intracranial abnormality. 3. Mild chronic microvascular ischemic disease with a few scattered remote lacunar infarcts about the basal ganglia and thalami. Electronically Signed   By: Benjamin  McClintock M.D.   On: 10/20/2020 05:49   US Carotid Bilateral  Result Date: 10/21/2020 CLINICAL DATA:  87-year-old female with a history of stroke EXAM: BILATERAL CAROTID DUPLEX ULTRASOUND TECHNIQUE: Gray scale imaging, color Doppler and duplex ultrasound were performed of bilateral carotid and vertebral arteries in the neck. COMPARISON:  None. FINDINGS: Criteria: Quantification of carotid stenosis is based on velocity parameters that correlate the residual internal carotid diameter with NASCET-based stenosis levels, using the diameter of the distal internal carotid lumen as the denominator for stenosis measurement. The following velocity measurements were obtained: RIGHT ICA:  Systolic 82 cm/sec, Diastolic 19 cm/sec CCA:  60 cm/sec SYSTOLIC ICA/CCA RATIO:  1.4 ECA:  225 cm/sec LEFT ICA:  Systolic 106 cm/sec, Diastolic 19 cm/sec CCA:  75 cm/sec SYSTOLIC ICA/CCA RATIO:  1.4 ECA:  192 cm/sec Right Brachial SBP: Not acquired Left Brachial  SBP: Not acquired RIGHT CAROTID ARTERY: No significant calcifications of the right common carotid artery. Intermediate waveform maintained. Moderate heterogeneous and partially calcified plaque at the right carotid bifurcation. No significant lumen shadowing. Low resistance waveform of the right ICA. No significant tortuosity. RIGHT VERTEBRAL ARTERY: Antegrade flow with low resistance waveform. LEFT CAROTID ARTERY: No significant calcifications of the left common carotid artery. Intermediate waveform maintained. Moderate heterogeneous and partially calcified plaque at the left carotid bifurcation. No significant lumen shadowing. Low resistance waveform of the left ICA. No significant tortuosity. LEFT VERTEBRAL ARTERY:    Antegrade flow with low resistance waveform. IMPRESSION: Color duplex indicates moderate heterogeneous and calcified plaque, with no hemodynamically significant stenosis by duplex criteria in the extracranial cerebrovascular circulation. Signed, Jaime S. Wagner, DO, RPVI [facility] Electronically Signed   By: Jaime  Wagner D.O.   On: 10/21/2020 16:03   ECHOCARDIOGRAM COMPLETE BUBBLE STUDY  Result Date: 10/20/2020    ECHOCARDIOGRAM REPORT   Patient Name:   Jahanna ANN Schwebke Date of Exam: 10/20/2020 Medical Rec #:  [id number]       Height:       63.0 in Accession #:    [id number]      Weight:       129.4 lb Date of Birth:  07/29/1928       BSA:          1.607 m Patient Age:    87 years        BP:           168/62 mmHg Patient Gender: F               HR:           83 bpm. Exam Location:  [facility] Procedure: 2D Echo, Cardiac Doppler, Color Doppler and Saline Contrast Bubble            Study Indications:     TIA 434.9 / G45.9  History:         Patient has no prior history of Echocardiogram examinations.                  Risk Factors:Hypertension and Diabetes.  Sonographer:     Jerry Hege RDCS (AE) Referring Phys:  [id number] JUSTIN B HOWERTER Diagnosing  Phys: Muhammad Arida MD  Sonographer Comments: Suboptimal parasternal window. IMPRESSIONS  1. Left ventricular ejection fraction, by estimation, is 55 to 60%. The left ventricle has normal function. Left ventricular endocardial border not optimally defined to evaluate regional wall motion. There is mild left ventricular hypertrophy. Left ventricular diastolic parameters are indeterminate.  2. Right ventricular systolic function is normal. The right ventricular size is normal. There is mildly elevated pulmonary artery systolic pressure.  3. The mitral valve is normal in structure. Mild mitral valve regurgitation. No evidence of mitral stenosis. Moderate mitral annular calcification.  4. The aortic valve is normal in structure. Aortic valve regurgitation is not visualized. Mild to moderate aortic valve sclerosis/calcification is present, without any evidence of aortic stenosis.  5. The inferior vena cava is normal in size with greater than 50% respiratory variability, suggesting right atrial pressure of 3 mmHg.  6. Agitated saline contrast bubble study was negative, with no evidence of any interatrial shunt. FINDINGS  Left Ventricle: Left ventricular ejection fraction, by estimation, is 55 to 60%. The left ventricle has normal function. Left ventricular endocardial border not optimally defined to evaluate regional wall motion. The left ventricular internal cavity size was normal in size. There is mild left ventricular hypertrophy. Left ventricular diastolic parameters are indeterminate. Right Ventricle: The right ventricular size is normal. No increase in right ventricular wall thickness. Right ventricular systolic function is normal. There is mildly elevated pulmonary artery systolic pressure. The tricuspid regurgitant velocity is 3.18  m/s, and with an assumed right atrial pressure of 3 mmHg, the estimated right ventricular systolic pressure is 43.4 mmHg. Left Atrium: Left atrial size was normal in size. Right  Atrium: Right atrial size was normal in size. Pericardium: There is no evidence of pericardial effusion. Mitral   Valve: The mitral valve is normal in structure. Moderate mitral annular calcification. Mild mitral valve regurgitation. No evidence of mitral valve stenosis. Tricuspid Valve: The tricuspid valve is normal in structure. Tricuspid valve regurgitation is trivial. No evidence of tricuspid stenosis. Aortic Valve: The aortic valve is normal in structure. Aortic valve regurgitation is not visualized. Mild to moderate aortic valve sclerosis/calcification is present, without any evidence of aortic stenosis. Aortic valve mean gradient measures 4.5 mmHg. Aortic valve peak gradient measures 9.4 mmHg. Aortic valve area, by VTI measures 2.66 cm. Pulmonic Valve: The pulmonic valve was normal in structure. Pulmonic valve regurgitation is not visualized. No evidence of pulmonic stenosis. Aorta: The aortic root is normal in size and structure. Venous: The inferior vena cava is normal in size with greater than 50% respiratory variability, suggesting right atrial pressure of 3 mmHg. IAS/Shunts: No atrial level shunt detected by color flow Doppler. Agitated saline contrast was given intravenously to evaluate for intracardiac shunting. Agitated saline contrast bubble study was negative, with no evidence of any interatrial shunt.  LEFT VENTRICLE PLAX 2D LVIDd:         2.75 cm  Diastology LVIDs:         2.02 cm  LV e' medial:    6.31 cm/s LV PW:         1.18 cm  LV E/e' medial:  18.5 LV IVS:        0.90 cm  LV e' lateral:   6.85 cm/s LVOT diam:     2.00 cm  LV E/e' lateral: 17.1 LV SV:         85 LV SV Index:   53 LVOT Area:     3.14 cm  RIGHT VENTRICLE RV Basal diam:  2.97 cm RV S prime:     16.90 cm/s TAPSE (M-mode): 3.2 cm LEFT ATRIUM           Index       RIGHT ATRIUM          Index LA diam:      2.60 cm 1.62 cm/m  RA Area:     7.99 cm LA Vol (A2C): 24.4 ml 15.18 ml/m RA Volume:   13.80 ml 8.59 ml/m LA Vol (A4C): 26.3  ml 16.37 ml/m  AORTIC VALVE                    PULMONIC VALVE AV Area (Vmax):    2.42 cm     PV Vmax:        0.37 m/s AV Area (Vmean):   2.12 cm     PV Peak grad:   0.5 mmHg AV Area (VTI):     2.66 cm     RVOT Peak grad: 1 mmHg AV Vmax:           153.00 cm/s AV Vmean:          101.250 cm/s AV VTI:            0.320 m AV Peak Grad:      9.4 mmHg AV Mean Grad:      4.5 mmHg LVOT Vmax:         118.00 cm/s LVOT Vmean:        68.400 cm/s LVOT VTI:          0.271 m LVOT/AV VTI ratio: 0.85  AORTA Ao Root diam: 2.77 cm MITRAL VALVE                TRICUSPID VALVE MV   Area (PHT): 3.60 cm     TR Peak grad:   40.4 mmHg MV Decel Time: 211 msec     TR Vmax:        318.00 cm/s MV E velocity: 117.00 cm/s MV A velocity: 138.00 cm/s  SHUNTS MV E/A ratio:  0.85         Systemic VTI:  0.27 m                             Systemic Diam: 2.00 cm Muhammad Arida MD Electronically signed by Muhammad Arida MD Signature Date/Time: 10/20/2020/1:44:50 PM    Final       Assessment/Plan 1.  Left hemispheric stroke.  She has undergone multiple imaging studies which I have independently reviewed.  She had an MRI yesterday which showed a very small left acute stroke.  Unfortunately, her MRI today shows significant enlargement of this left-sided stroke.  It went from about 5 mm to about 15 mm in size.  She had a CT angiogram of the head and neck which I have reviewed.  This is officially interpreted as a 70% left ICA stenosis as well as a left vertebral artery stenosis at its origin.  There was mild right internal carotid artery stenosis as well.  I actually would interpret this as a higher degree of stenosis as it is circumferentially calcified and very dense, but appears to be a fairly high-grade stenosis.  She had a carotid duplex done today which was interpreted as having plaque bilaterally without any significant stenosis, but due to the dense circumferential calcification this tends to create underestimate of the degree of stenosis with  carotid duplex making carotid duplex less helpful. Given her findings on imaging study, it is likely secondary to carotid disease.  This is a difficult situation however as her stroke is evolving, and any acute intervention is likely to worsen the situation due to reperfusion syndrome.  We would generally wait 1 to 2 weeks before considering revascularization.  I talked her daughter about both carotid artery stenting and carotid endarterectomy, but we will need to see how she does in terms of her clinical appearance from her acute stroke.  Would recommend addition of Plavix for dual antiplatelet therapy in addition to her statin agent. 2.  Carotid stenosis.  See #1. 3.  Diabetes.  Stable on outpatient medications and blood glucose control important in reducing the progression of atherosclerotic disease. Also, involved in wound healing. On appropriate medications. 4.  Hypertension. Stable on outpatient medications and blood pressure control important in reducing the progression of atherosclerotic disease. On appropriate oral medications.    Sharayah Renfrow, MD  10/21/2020 4:39 PM    This note was created with Dragon medical transcription system.  Any error is purely unintentional  

## 2020-10-21 NOTE — Progress Notes (Signed)
PROGRESS NOTE    Cassandra Schaefer  ZOX:[id number] DOB: 11-22-1928 DOA: 10/19/2020 PCP: Barbette Reichmann, MD     Brief Narrative:   Hx htn, dm, ckd 3, presenting with right sided weakness, gait dysfunction, and speech dysfunction beginning 12:30 on 6/26. MRI with left basal ganglia small ischemic stroke, no hemorrhage.   Assessment & Plan:   Principal Problem:   TIA (transient ischemic attack) Active Problems:   Hyperlipidemia, unspecified   Hypertension   Hypothyroidism, acquired   Type 2 diabetes mellitus (HCC)   Right hemiparesis (HCC)   Ischemic stroke (HCC)  # Ischemic CVA # Carotid/vertebral artery stenosis No hemorrhagic transformation. Symptoms are mild. Received asa 325 at home. CTA with carotid bulb plaque and vertebral artery plaque w/ severe stenosis. Passed swallow eval. TTE unremarkable. Occasional tachycardia on tele but no a fib. PT advising inpatient rehab - neuro following - cont asa/statin - inpatient rehab eval - vascular consult, carotid u/s pending - ziopatch at discharge if discharged home and no abnormal rhythm encountered here; maintain tele for now  # T2DM On metformin at home. No hypoglycemia here - hold home metformin, monitor daily fasting sugar for now  # Hypothyroid - home levothyroxine  # HTN - neuro ok with resuming home bp meds. Will hold on that for now given normotension   DVT prophylaxis: lovenox Code Status: full Family Communication: daughter (who is hcpoa) updated @ bedside 6/28  Level of care: Med-Surg Status is: inpt  The patient will require care spanning > 2 midnights and should be moved to inpatient because: Inpatient level of care appropriate due to severity of illness  Dispo: The patient is from: Home              Anticipated d/c is to:  tbd              Patient currently is not medically stable to d/c.   Difficult to place patient No        Consultants:  neurology  Procedures: none  Antimicrobials:   none    Subjective: This morning daughter thinks speech perhaps a little more slurred. Weakness unchanged.  Objective: Vitals:   10/21/20 0131 10/21/20 0349 10/21/20 0450 10/21/20 0745  BP:  140/63  (!) 133/57  Pulse:  64  63  Resp: Temp:  97.8 F (36.6 C)  97.9 F (36.6 C)  TempSrc:      SpO2:  95%  96%  Weight:   55.2 kg   Height:       No intake or output data in the 24 hours ending 10/21/20 1028 Filed Weights   10/20/20 0045 10/20/20 0151 10/21/20 0450  Weight: 58.7 kg 58.7 kg 55.2 kg    Examination:  General exam: Appears calm and comfortable  Respiratory system: Clear to auscultation. Respiratory effort normal. Cardiovascular system: S1 & S2 heard, RRR. No JVD, murmurs, rubs, gallops or clicks. No pedal edema. Gastrointestinal system: Abdomen is nondistended, soft and nontender. No organomegaly or masses felt. Normal bowel sounds heard. Central nervous system: Alert and oriented. No focal neurological deficits. Extremities: Symmetric 5 x 5 power. Skin: No rashes, lesions or ulcers Psychiatry: Judgement and insight appear normal. Mood & affect appropriate.     Data Reviewed: I have personally reviewed following labs and imaging studies  CBC: Recent Labs  Lab 10/19/20 2101 10/20/20 0423 10/21/20 0405  WBC 8.1 6.7 12.3*  NEUTROABS 3.9  --   --   HGB 11.9* 10.7* 11.6*  HCT 35.7* 32.9* 34.7*  MCV 94.9 94.8 94.3  PLT 242 206 220   Basic Metabolic Panel: Recent Labs  Lab 10/19/20 2101 10/20/20 0423 10/21/20 0405  NA 135 140 138  K 4.3 4.2 4.7  CL 107 111 109  CO2 20* 23 23  GLUCOSE 245* 144* 141*  BUN 28* 26* 27*  CREATININE 1.40* 1.34* 1.51*  CALCIUM 8.9 9.0 9.0  MG 1.9 1.8  --    GFR: Estimated Creatinine Clearance: 20.5 mL/min (A) (by C-G formula based on SCr of 1.51 mg/dL (H)). Liver Function Tests: Recent Labs  Lab 10/19/20 2101 10/20/20 0423  AST 18 16  ALT 12 11  ALKPHOS 103 83  BILITOT 0.7 0.5  PROT 7.3 6.5  ALBUMIN  4.1 3.5   No results for input(s): LIPASE, AMYLASE in the last 168 hours. No results for input(s): AMMONIA in the last 168 hours. Coagulation Profile: Recent Labs  Lab 10/19/20 2101  INR 1.1   Cardiac Enzymes: No results for input(s): CKTOTAL, CKMB, CKMBINDEX, TROPONINI in the last 168 hours. BNP (last 3 results) No results for input(s): PROBNP in the last 8760 hours. HbA1C: Recent Labs    10/19/20 2101  HGBA1C 7.0*   CBG: Recent Labs  Lab 10/19/20 2242 10/20/20 0809 10/20/20 2040 10/21/20 0746  GLUCAP 167* 128* 120* 128*   Lipid Profile: Recent Labs    10/19/20 2101  CHOL 128  HDL 43  LDLCALC 43  TRIG 211*  CHOLHDL 3.0   Thyroid Function Tests: No results for input(s): TSH, T4TOTAL, FREET4, T3FREE, THYROIDAB in the last 72 hours. Anemia Panel: No results for input(s): VITAMINB12, FOLATE, FERRITIN, TIBC, IRON, RETICCTPCT in the last 72 hours. Urine analysis:    Component Value Date/Time   COLORURINE STRAW (A) 10/19/2020 2226   APPEARANCEUR CLEAR (A) 10/19/2020 2226   LABSPEC 1.009 10/19/2020 2226   PHURINE 5.0 10/19/2020 2226   GLUCOSEU NEGATIVE 10/19/2020 2226   HGBUR NEGATIVE 10/19/2020 2226   BILIRUBINUR NEGATIVE 10/19/2020 2226   KETONESUR NEGATIVE 10/19/2020 2226   PROTEINUR NEGATIVE 10/19/2020 2226   NITRITE NEGATIVE 10/19/2020 2226   LEUKOCYTESUR TRACE (A) 10/19/2020 2226   Sepsis Labs: @LABRCNTIP (procalcitonin:4,lacticidven:4)  ) Recent Results (from the past 240 hour(s))  Resp Panel by RT-PCR (Flu A&B, Covid) Nasopharyngeal Swab     Status: None   Collection Time: 10/19/20 10:26 PM   Specimen: Nasopharyngeal Swab; Nasopharyngeal(NP) swabs in vial transport medium  Result Value Ref Range Status   SARS Coronavirus 2 by RT PCR NEGATIVE NEGATIVE Final    Comment: (NOTE) SARS-CoV-2 target nucleic acids are NOT DETECTED.  The SARS-CoV-2 RNA is generally detectable in upper respiratory specimens during the acute phase of infection. The  lowest concentration of SARS-CoV-2 viral copies this assay can detect is 138 copies/mL. A negative result does not preclude SARS-Cov-2 infection and should not be used as the sole basis for treatment or other patient management decisions. A negative result may occur with  improper specimen collection/handling, submission of specimen other than nasopharyngeal swab, presence of viral mutation(s) within the areas targeted by this assay, and inadequate number of viral copies(<138 copies/mL). A negative result must be combined with clinical observations, patient history, and epidemiological information. The expected result is Negative.  Fact Sheet for Patients:  BloggerCourse.comhttps://www.fda.gov/media/152166/download  Fact Sheet for Healthcare Providers:  SeriousBroker.ithttps://www.fda.gov/media/152162/download  This test is no t yet approved or cleared by the [place] FDA and  has been authorized for detection and/or diagnosis of SARS-CoV-2 by FDA under an Emergency  Use Authorization (EUA). This EUA will remain  in effect (meaning this test can be used) for the duration of the COVID-19 declaration under Section 564(b)(1) of the Act, 21 U.S.C.section 360bbb-3(b)(1), unless the authorization is terminated  or revoked sooner.       Influenza A by PCR NEGATIVE NEGATIVE Final   Influenza B by PCR NEGATIVE NEGATIVE Final    Comment: (NOTE) The Xpert Xpress SARS-CoV-2/FLU/RSV plus assay is intended as an aid in the diagnosis of influenza from Nasopharyngeal swab specimens and should not be used as a sole basis for treatment. Nasal washings and aspirates are unacceptable for Xpert Xpress SARS-CoV-2/FLU/RSV testing.  Fact Sheet for Patients: BloggerCourse.com  Fact Sheet for Healthcare Providers: SeriousBroker.it  This test is not yet approved or cleared by the [place] FDA and has been authorized for detection and/or diagnosis of SARS-CoV-2 by FDA under  an Emergency Use Authorization (EUA). This EUA will remain in effect (meaning this test can be used) for the duration of the COVID-19 declaration under Section 564(b)(1) of the Act, 21 U.S.C. section 360bbb-3(b)(1), unless the authorization is terminated or revoked.  Performed at [facility], [address] [place], [place] [address]   Urine Culture     Status: Abnormal   Collection Time: 10/19/20 10:26 PM   Specimen: Urine, Random  Result Value Ref Range Status   Specimen Description   Final    URINE, RANDOM Performed at [facility], [address], [place], [place] [address]    Special Requests   Final    NONE Performed at [facility], [address] [place], [place] [address]    Culture MULTIPLE SPECIES PRESENT, SUGGEST RECOLLECTION (A)  Final   Report Status 10/21/2020 FINAL  Final         Radiology Studies: CT ANGIO HEAD NECK W WO CM  Result Date: 10/19/2020 CLINICAL DATA:  Initial evaluation for acute right-sided weakness, speech difficulty. EXAM: CT ANGIOGRAPHY HEAD AND NECK TECHNIQUE: Multidetector CT imaging of the head and neck was performed using the standard protocol during bolus administration of intravenous contrast. Multiplanar CT image reconstructions and MIPs were obtained to evaluate the vascular anatomy. Carotid stenosis measurements (when applicable) are obtained utilizing NASCET criteria, using the distal internal carotid diameter as the denominator. CONTRAST:  75mL OMNIPAQUE IOHEXOL 350 MG/ML SOLN COMPARISON:  None. FINDINGS: CT HEAD FINDINGS Brain: Generalized age-related cerebral atrophy. Few small remote lacunar infarcts noted about the right basal ganglia and right thalamus. No acute intracranial hemorrhage. No acute large vessel territory infarct. No mass lesion, midline shift or mass effect. No hydrocephalus or extra-axial fluid collection. Vascular: No hyperdense vessel. Scattered vascular calcifications noted within the  carotid siphons. Skull: Scalp soft tissues demonstrate no acute finding. Calvarium intact. Sinuses: Changes of chronic sinusitis noted. No active disease. No mastoid effusion. Orbits:  Globes orbital soft tissues demonstrate no acute finding. CTA NECK FINDINGS Aortic arch: Visualized aortic arch normal caliber with normal 3 vessel morphology. Moderate atheromatous change about the arch and origin of the great vessels without high-grade stenosis. Right carotid system: Right CCA patent from its origin to the bifurcation without stenosis. Eccentric mixed plaque at the right carotid bulb/proximal right ICA with associated mild 35% stenosis by NASCET criteria. Right ICA patent distally without stenosis, dissection or occlusion. Left carotid system: The CCA patent from its origin to the bifurcation without stenosis. Bulky calcified plaque about the left carotid bulb with associated stenosis of up to 70% by NASCET criteria. Left ICA  patent distally without stenosis, dissection or occlusion. Vertebral arteries: Both vertebral arteries arise from subclavian arteries. No significant proximal subclavian artery stenosis. Strongly dominant left vertebral artery with a diffusely hypoplastic right vertebral artery. Atheromatous plaque at the origin of the dominant left vertebral artery with associated severe stenosis (series 10, image 77). Dominant left vertebral artery otherwise patent within the neck. Focal severe distal right V3 stenosis noted just prior to cranial vault (series 10, image 196). Skeleton: No visible acute osseous finding. No discrete or worrisome osseous lesions. Other neck: No other acute soft tissue abnormality within the neck. No mass or adenopathy. Upper chest: Scattered interlobular septal thickening noted within the visualized lungs, suggesting mild pulmonary interstitial edema. Mildly prominent precarinal node measures up to the upper limits of normal at 1 cm. Visualized upper chest demonstrates no other  acute finding. Review of the MIP images confirms the above findings CTA HEAD FINDINGS Anterior circulation: Petrous segments patent bilaterally. Scattered atheromatous change within the carotid siphons without high-grade stenosis. A1 segments patent bilaterally. Normal anterior communicating artery complex. Anterior cerebral arteries patent to their distal aspects without stenosis. No M1 stenosis or occlusion. Normal MCA bifurcations. Distal MCA branches well perfused and symmetric. Posterior circulation: Dominant left vertebral artery widely patent to the vertebrobasilar junction. Right vertebral artery markedly hypoplastic and irregular, but is grossly patent to the vertebrobasilar junction as well. Right PICA origin patent. Left PICA not seen. Basilar patent to its distal aspect without stenosis. Superior cerebral arteries patent bilaterally. Left PCA supplied via the basilar and is widely patent to its distal aspect. Fetal type origin of the right PCA. Atheromatous change throughout the right PCA with associated moderate multifocal P2/P3 stenoses. Venous sinuses: Grossly patent allowing for timing the contrast bolus. Anatomic variants: Fetal type origin of the right PCA.  No aneurysm. Review of the MIP images confirms the above findings IMPRESSION: CT HEAD IMPRESSION: 1. No acute intracranial abnormality. 2. Few small remote lacunar infarcts about the right basal ganglia and thalamus. CTA HEAD AND NECK IMPRESSION: 1. Negative CTA for emergent large vessel occlusion. 2. Bulky calcified plaque about the left carotid bulb with associated stenosis of up to 70% by NASCET criteria. 3. Atheromatous plaque at the origin of the dominant left vertebral artery with associated severe stenosis. Strongly dominant left vertebral artery otherwise patent within the neck. 4. Atheromatous change throughout the right PCA with associated moderate multifocal P2/P3 stenoses. 5. Mild pulmonary interstitial edema. Electronically Signed    By: Rise Mu M.D.   On: 10/19/2020 22:55   MR BRAIN WO CONTRAST  Result Date: 10/20/2020 CLINICAL DATA:  Initial evaluation for neuro deficit, stroke suspected. Acute right-sided weakness. EXAM: MRI HEAD WITHOUT CONTRAST TECHNIQUE: Multiplanar, multiecho pulse sequences of the brain and surrounding structures were obtained without intravenous contrast. COMPARISON:  Prior CTA from 10/19/2020. FINDINGS: Brain: Mild diffuse prominence of the CSF containing spaces compatible generalized cerebral atrophy. Scattered patchy T2/FLAIR hyperintensity within the periventricular and deep white matter both cerebral hemispheres as well as the pons, most consistent with chronic small vessel ischemic disease, mild for age. Few scattered remote lacunar infarcts present about the basal ganglia and thalami. There is a subtle punctate 5 mm focus of diffusion signal abnormality involving the left basal ganglia, only seen on coronal DWI sequence (series 7, image 18). Given the patient history of right-sided weakness, findings suspicious for a tiny punctate ischemic infarct. No associated hemorrhage or mass effect. No other diffusion abnormality to suggest acute or subacute ischemia. Gray-white matter  differentiation otherwise maintained. No other areas of remote cortical infarction. No other evidence for acute or chronic intracranial hemorrhage. No mass lesion, midline shift or mass effect. No hydrocephalus or extra-axial fluid collection. Pituitary gland suprasellar region within normal limits. Midline structures intact. Vascular: Major intracranial vascular flow voids are maintained. Skull and upper cervical spine: Craniocervical junction within normal limits. Bone marrow signal intensity normal. No scalp soft tissue abnormality. Sinuses/Orbits: Patient status post bilateral ocular lens replacement. Globes and orbital soft tissues within normal limits. Paranasal sinuses are largely clear. No significant mastoid  effusion. Inner ear structures grossly normal. Other: None. IMPRESSION: 1. Subtle 5 mm focus of diffusion signal abnormality involving the left basal ganglia, suspicious for a tiny ischemic infarct given the patient history of right-sided weakness. No associated hemorrhage or mass effect. 2. No other acute intracranial abnormality. 3. Mild chronic microvascular ischemic disease with a few scattered remote lacunar infarcts about the basal ganglia and thalami. Electronically Signed   By: Rise Mu M.D.   On: 10/20/2020 05:49   ECHOCARDIOGRAM COMPLETE BUBBLE STUDY  Result Date: 10/20/2020    ECHOCARDIOGRAM REPORT   Patient Name:   Kiri ANN Azbill Date of Exam: 10/20/2020 Medical Rec #:  [id number]       Height:       63.0 in Accession #:    [id number]      Weight:       129.4 lb Date of Birth:  1928/09/15       BSA:          1.607 m Patient Age:    87 years        BP:           168/62 mmHg Patient Gender: F               HR:           83 bpm. Exam Location:  [facility] Procedure: 2D Echo, Cardiac Doppler, Color Doppler and Saline Contrast Bubble            Study Indications:     TIA 434.9 / G45.9  History:         Patient has no prior history of Echocardiogram examinations.                  Risk Factors:Hypertension and Diabetes.  Sonographer:     Cristela Blue RDCS (AE) Referring Phys:  [id number] Angie Fava Diagnosing Phys: Lorine Bears MD  Sonographer Comments: Suboptimal parasternal window. IMPRESSIONS  1. Left ventricular ejection fraction, by estimation, is 55 to 60%. The left ventricle has normal function. Left ventricular endocardial border not optimally defined to evaluate regional wall motion. There is mild left ventricular hypertrophy. Left ventricular diastolic parameters are indeterminate.  2. Right ventricular systolic function is normal. The right ventricular size is normal. There is mildly elevated pulmonary artery systolic pressure.  3. The mitral valve is normal in structure. Mild mitral  valve regurgitation. No evidence of mitral stenosis. Moderate mitral annular calcification.  4. The aortic valve is normal in structure. Aortic valve regurgitation is not visualized. Mild to moderate aortic valve sclerosis/calcification is present, without any evidence of aortic stenosis.  5. The inferior vena cava is normal in size with greater than 50% respiratory variability, suggesting right atrial pressure of 3 mmHg.  6. Agitated saline contrast bubble study was negative, with no evidence of any interatrial shunt. FINDINGS  Left Ventricle: Left ventricular ejection fraction, by estimation, is 55 to 60%. The left  ventricle has normal function. Left ventricular endocardial border not optimally defined to evaluate regional wall motion. The left ventricular internal cavity size was normal in size. There is mild left ventricular hypertrophy. Left ventricular diastolic parameters are indeterminate. Right Ventricle: The right ventricular size is normal. No increase in right ventricular wall thickness. Right ventricular systolic function is normal. There is mildly elevated pulmonary artery systolic pressure. The tricuspid regurgitant velocity is 3.18  m/s, and with an assumed right atrial pressure of 3 mmHg, the estimated right ventricular systolic pressure is 43.4 mmHg. Left Atrium: Left atrial size was normal in size. Right Atrium: Right atrial size was normal in size. Pericardium: There is no evidence of pericardial effusion. Mitral Valve: The mitral valve is normal in structure. Moderate mitral annular calcification. Mild mitral valve regurgitation. No evidence of mitral valve stenosis. Tricuspid Valve: The tricuspid valve is normal in structure. Tricuspid valve regurgitation is trivial. No evidence of tricuspid stenosis. Aortic Valve: The aortic valve is normal in structure. Aortic valve regurgitation is not visualized. Mild to moderate aortic valve sclerosis/calcification is present, without any evidence of  aortic stenosis. Aortic valve mean gradient measures 4.5 mmHg. Aortic valve peak gradient measures 9.4 mmHg. Aortic valve area, by VTI measures 2.66 cm. Pulmonic Valve: The pulmonic valve was normal in structure. Pulmonic valve regurgitation is not visualized. No evidence of pulmonic stenosis. Aorta: The aortic root is normal in size and structure. Venous: The inferior vena cava is normal in size with greater than 50% respiratory variability, suggesting right atrial pressure of 3 mmHg. IAS/Shunts: No atrial level shunt detected by color flow Doppler. Agitated saline contrast was given intravenously to evaluate for intracardiac shunting. Agitated saline contrast bubble study was negative, with no evidence of any interatrial shunt.  LEFT VENTRICLE PLAX 2D LVIDd:         2.75 cm  Diastology LVIDs:         2.02 cm  LV e' medial:    6.31 cm/s LV PW:         1.18 cm  LV E/e' medial:  18.5 LV IVS:        0.90 cm  LV e' lateral:   6.85 cm/s LVOT diam:     2.00 cm  LV E/e' lateral: 17.1 LV SV:         85 LV SV Index:   53 LVOT Area:     3.14 cm  RIGHT VENTRICLE RV Basal diam:  2.97 cm RV S prime:     16.90 cm/s TAPSE (M-mode): 3.2 cm LEFT ATRIUM           Index       RIGHT ATRIUM          Index LA diam:      2.60 cm 1.62 cm/m  RA Area:     7.99 cm LA Vol (A2C): 24.4 ml 15.18 ml/m RA Volume:   13.80 ml 8.59 ml/m LA Vol (A4C): 26.3 ml 16.37 ml/m  AORTIC VALVE                    PULMONIC VALVE AV Area (Vmax):    2.42 cm     PV Vmax:        0.37 m/s AV Area (Vmean):   2.12 cm     PV Peak grad:   0.5 mmHg AV Area (VTI):     2.66 cm     RVOT Peak grad: 1 mmHg AV Vmax:  153.00 cm/s AV Vmean:          101.250 cm/s AV VTI:            0.320 m AV Peak Grad:      9.4 mmHg AV Mean Grad:      4.5 mmHg LVOT Vmax:         118.00 cm/s LVOT Vmean:        68.400 cm/s LVOT VTI:          0.271 m LVOT/AV VTI ratio: 0.85  AORTA Ao Root diam: 2.77 cm MITRAL VALVE                TRICUSPID VALVE MV Area (PHT): 3.60 cm     TR Peak  grad:   40.4 mmHg MV Decel Time: 211 msec     TR Vmax:        318.00 cm/s MV E velocity: 117.00 cm/s MV A velocity: 138.00 cm/s  SHUNTS MV E/A ratio:  0.85         Systemic VTI:  0.27 m                             Systemic Diam: 2.00 cm Lorine Bears MD Electronically signed by Lorine Bears MD Signature Date/Time: 10/20/2020/1:44:50 PM    Final         Scheduled Meds:  aspirin  81 mg Oral Daily   atorvastatin  80 mg Oral Daily   enoxaparin (LOVENOX) injection  30 mg Subcutaneous Q24H   insulin aspart  0-5 Units Subcutaneous QHS   levothyroxine  88 mcg Oral Q0600   Continuous Infusions:   LOS: 1 day    Time spent: 30 min    Silvano Bilis, MD [facility]   If 7PM-7AM, please contact night-coverage [email or web address] Password [facility] 10/21/2020, 10:28 AM

## 2020-10-21 NOTE — Progress Notes (Signed)
Physical Therapy Treatment Patient Details Name: Cassandra Schaefer MRN: [id number] DOB: 06-10-1928 Today's Date: 10/21/2020    History of Present Illness 87 y.o. female with medical history significant for type 2 diabetes mellitus, essential hypertension, acquired hypothyroidism, hyperlipidemia, stage III chronic kidney disease with baseline creatinine 1.4-1.7, who is admitted to [facility] on 10/19/2020 with suspected TIA versus acute ischemic CVA after presenting from home to [facility] ED complaining of right-sided weakness. Confirmed L basal ganglia ischemic stroke.    PT Comments    Pt was sitting in recliner with supportive Banker) daughter. Pt is on tele and has HR reading as high as 160s however with manual radial pulse check was on in 80s. RN aware. Per pt," I'm doing worst today." Daughter concerned about CVA extending. Lengthy discussion about concerns with DC to home from acute hospital. Pt was extremely independent PTA. She now requires constant assistance with all functional activity. Discussed importance of therapy especially in first few months after stroke. PT/pt's daughter agreeable to pursue [name] at DC however unwilling to go to local SNFs. She required more assistance today with standing/ambulation. Used RW + RUE walker splint to allow pt to maintain grasp on RW. She did ambulate one time to BR then 1 x 45 ft. Extremely fatigued form minimal activity." I did a lot better yesterday." Pt has some expressive aphasia and does get slightly frustrated when unable to say what she wants as quickly as she wants. Educated on expectations going forward. Highly recommend DC to [name] to address deficits while maximizing independence with ADLs.    Follow Up Recommendations  [name];Supervision/Assistance - 24 hour     Equipment Recommendations  Rolling walker with 5" wheels    Recommendations for Other Services Rehab consult     Precautions / Restrictions Precautions Precautions:  Fall Restrictions Weight Bearing Restrictions: No    Mobility  Bed Mobility      General bed mobility comments: Pt was in recliner pre/post session    Transfers Overall transfer level: Needs assistance Equipment used: Rolling walker (2 wheeled) (+ R handsplint to keep grip on RW) Transfers: Sit to/from Stand Sit to Stand: Min assist;Mod assist         General transfer comment: pt required a little more assistance to stand today. stood from recliner and toilet with vcs for improved technique.  Ambulation/Gait Ambulation/Gait assistance: Min assist;Mod assist Gait Distance (Feet): 45 Feet Assistive device: Rolling walker (2 wheeled) Gait Pattern/deviations: Step-to pattern;Scissoring;Staggering left;Staggering right;Ataxic Gait velocity: decreased   General Gait Details: Pt required use of AD today due to severe balance deficits. unsafe to trial without AD. more assistance required to prevent falling. has ~ 5 episodes of scissoring with intervention to prevent falling. Fatigues extremely quickly. constant vcs throughout for wt shift to allow RLE advancement. does have some ataxic movements. required use of RUE hand splint to maintain grasp on RW.      Balance Overall balance assessment: Needs assistance Sitting-balance support: Feet supported Sitting balance-Leahy Scale: Fair     Standing balance support: Bilateral upper extremity supported;During functional activity Standing balance-Leahy Scale: Poor Standing balance comment: requires constant assistance in standing to prevent falling      Cognition Arousal/Alertness: Awake/alert Behavior During Therapy: WFL for tasks assessed/performed Overall Cognitive Status: Within Functional Limits for tasks assessed      General Comments: slight expressive aphasia. gets a little frustrated but overall extremely pleasant             Pertinent Vitals/Pain  Pain Assessment: No/denies pain     PT Goals (current goals can  now be found in the care plan section) Acute Rehab PT Goals Patient Stated Goal: to get better and go home Progress towards PT goals: Progressing toward goals    Frequency    7X/week      PT Plan Discharge plan needs to be updated       AM-PAC PT "6 Clicks" Mobility   Outcome Measure  Help needed turning from your back to your side while in a flat bed without using bedrails?: None Help needed moving from lying on your back to sitting on the side of a flat bed without using bedrails?: A Lot Help needed moving to and from a bed to a chair (including a wheelchair)?: A Lot Help needed standing up from a chair using your arms (e.g., wheelchair or bedside chair)?: A Lot Help needed to walk in hospital room?: A Lot Help needed climbing 3-5 steps with a railing? : A Lot 6 Click Score: 14    End of Session Equipment Utilized During Treatment: Gait belt Activity Tolerance: Patient limited by fatigue;Other (comment) (HR = A-fib. Therapist questions accuracy of tele. readin as high as 160 while sitting however when radial pulse checked was in 80s. RN/MD aware.) Patient left: in chair;with call bell/phone within reach;with family/visitor present Nurse Communication: Mobility status PT Visit Diagnosis: Muscle weakness (generalized) (M62.81);Difficulty in walking, not elsewhere classified (R26.2)     Time: 6294-7654 PT Time Calculation (min) (ACUTE ONLY): 41 min  Charges:  $Gait Training: 23-37 mins $Neuromuscular Re-education: 8-22 mins                     Jetta Lout PTA 10/21/20, 10:59 AM

## 2020-10-22 DIAGNOSIS — Z01810 Encounter for preprocedural cardiovascular examination: Secondary | ICD-10-CM

## 2020-10-22 DIAGNOSIS — I491 Atrial premature depolarization: Secondary | ICD-10-CM

## 2020-10-22 DIAGNOSIS — I6389 Other cerebral infarction: Secondary | ICD-10-CM

## 2020-10-22 LAB — BASIC METABOLIC PANEL
Anion gap: 9 (ref 5–15)
BUN: 31 mg/dL — ABNORMAL HIGH (ref 8–23)
CO2: 22 mmol/L (ref 22–32)
Calcium: 9.1 mg/dL (ref 8.9–10.3)
Chloride: 108 mmol/L (ref 98–111)
Creatinine, Ser: 1.57 mg/dL — ABNORMAL HIGH (ref 0.44–1.00)
GFR, Estimated: 31 mL/min — ABNORMAL LOW (ref >60)
Glucose, Bld: 125 mg/dL — ABNORMAL HIGH (ref 70–99)
Potassium: 4.3 mmol/L (ref 3.5–5.1)
Sodium: 139 mmol/L (ref 135–145)

## 2020-10-22 LAB — GLUCOSE, CAPILLARY
Glucose-Capillary: 144 mg/dL — ABNORMAL HIGH (ref 70–99)
Glucose-Capillary: 135 mg/dL — ABNORMAL HIGH (ref 70–99)

## 2020-10-22 NOTE — Progress Notes (Signed)
Physical Therapy Treatment Patient Details Name: Cassandra Schaefer MRN: [id number] DOB: 1928-06-22 Today's Date: 10/22/2020    History of Present Illness 87 y.o. female with medical history significant for type 2 diabetes mellitus, essential hypertension, acquired hypothyroidism, hyperlipidemia, stage III chronic kidney disease with baseline creatinine 1.4-1.7, who is admitted to [facility] on 10/19/2020 with suspected TIA versus acute ischemic CVA after presenting from home to [facility] ED complaining of right-sided weakness. Confirmed L basal ganglia ischemic stroke.    PT Comments    Pt was long sitting in bed upon arriving. She agrees to session and is extremely pleasant and motivated. Was able to exit bed with vcs + min assist. Sat EOB without symptoms. Stood to [employer] and ambulated 120 ft with mostly min assist however occasional mod assist to prevent fall. Pt continues to have R lateral lean throughout all standing activity ( static+ dynamic) will greatly benefit from [name] to address deficits while maximizing independence. Pt will continue to assist pt with returning to PLOF.    Follow Up Recommendations  [name];Supervision/Assistance - 24 hour     Equipment Recommendations  Rolling walker with 5" wheels    Recommendations for Other Services Rehab consult     Precautions / Restrictions Precautions Precautions: Fall Restrictions Weight Bearing Restrictions: No    Mobility  Bed Mobility Overal bed mobility: Needs Assistance Bed Mobility: Supine to Sit;Sit to Supine     Supine to sit: Min assist Sit to supine: Min assist        Transfers Overall transfer level: Needs assistance Equipment used: Rolling walker (2 wheeled) Transfers: Sit to/from Stand Sit to Stand: Min assist         General transfer comment: Min assist to stand from lowest bed height with assistance placing RUE on RW splint. continues to struggle with grasp and with R laterakl lean present  throughout session  Ambulation/Gait Ambulation/Gait assistance: Min assist;Mod assist Gait Distance (Feet): 120 Feet Assistive device: Rolling walker (2 wheeled) Gait Pattern/deviations: Step-to pattern;Ataxic Gait velocity: decreased   General Gait Details: Pt was able to ambulate 120 ft with RW + walker splint. Continues to have ataxic step pattern with advancing RLE during swing phase. required mod assist to prevent falling when turning. HR stable throughout.          Balance Overall balance assessment: Needs assistance Sitting-balance support: Feet supported Sitting balance-Leahy Scale: Fair     Standing balance support: Bilateral upper extremity supported;During functional activity Standing balance-Leahy Scale: Poor Standing balance comment: high fall risk       Cognition Arousal/Alertness: Awake/alert Behavior During Therapy: WFL for tasks assessed/performed Overall Cognitive Status: Within Functional Limits for tasks assessed        General Comments: Pt was agreeable to session and reports feeling much better after taking short nap. Does continue to have slurred speak and expressive aphasia             Pertinent Vitals/Pain Pain Assessment: No/denies pain     PT Goals (current goals can now be found in the care plan section) Acute Rehab PT Goals Patient Stated Goal: to get better Progress towards PT goals: Progressing toward goals    Frequency    7X/week      PT Plan Discharge plan needs to be updated       AM-PAC PT "6 Clicks" Mobility   Outcome Measure  Help needed turning from your back to your side while in a flat bed without using bedrails?: None Help  needed moving from lying on your back to sitting on the side of a flat bed without using bedrails?: A Little Help needed moving to and from a bed to a chair (including a wheelchair)?: A Little Help needed standing up from a chair using your arms (e.g., wheelchair or bedside chair)?: A  Little Help needed to walk in hospital room?: A Lot Help needed climbing 3-5 steps with a railing? : A Lot 6 Click Score: 17    End of Session Equipment Utilized During Treatment: Gait belt Activity Tolerance: Patient tolerated treatment well;Patient limited by fatigue Patient left: in bed;with call bell/phone within reach;with bed alarm set;with family/visitor present Nurse Communication: Mobility status PT Visit Diagnosis: Muscle weakness (generalized) (M62.81);Difficulty in walking, not elsewhere classified (R26.2)     Time: 6568-1275 PT Time Calculation (min) (ACUTE ONLY): 25 min  Charges:  $Gait Training: 8-22 mins $Neuromuscular Re-education: 8-22 mins                     Jetta Lout PTA 10/22/20, 4:35 PM

## 2020-10-22 NOTE — H&P (Signed)
Physical Medicine and Rehabilitation Admission H&P    Chief Complaint  Patient presents with   Cerebrovascular Accident with functional deficits    HPI: Cassandra Schaefer. Cassandra Schaefer is a 87 year old right-handed female with history of T2DM, HTN, hypothyroidism, CKD III who was admitted to [facility] on 10/19/2020 with difficulty walking due to right-sided weakness and sensory deficits as well as word finding deficits that started the day before.  BP elevated at admission patient outside tPA window.  MRI brain done showing subtle diffusion abnormality left basal ganglia with few scattered remote lacunar infarcts.  CTA head neck showed 70% stenosis left carotid bulb with associated severe stenosis at origin of dominant left-VA.  Stroke felt to be likely due to carotid artery stenosis and Dr. Ocie Bob consulted for input but patient had worsening of symptoms with lethargy on 06/27.  Follow-up MRI showed progression of infarct with increasing signs from 5 to 15 mm.  Plavix added to ASA per input from Dr. Otelia Limes.    She has had issues with tachycardia with occasional PACs but no A. Fib--Cardiology felt that stroke due to CAS and recommended CEA when appropriate.  Dr. Wyn Quaker recommended waiting a couple of weeks prior to stenting of L-CEA. Patient with resultant mild dysarthria with word finding deficits and right sided weakness affecting mobility and ADLs. [name] recommended due to functional decline.    Review of Systems  Constitutional:  Negative for fever.  HENT:  Negative for tinnitus.   Eyes:  Negative for double vision and photophobia.  Respiratory:  Negative for cough and hemoptysis.   Cardiovascular:  Negative for chest pain and palpitations.  Gastrointestinal:  Negative for nausea and vomiting.  Genitourinary:  Negative for frequency and urgency.  Musculoskeletal:  Negative for myalgias and neck pain.  Skin:  Negative for rash.  Neurological:  Positive for focal weakness and weakness. Negative for dizziness.   Psychiatric/Behavioral:  Negative for depression and suicidal ideas.     Past Medical History:  Diagnosis Date   Anemia    Cough    RESOLVING, NO FEVER   Diabetes mellitus without complication (HCC)    Gout    Hypertension    Hypothyroidism    PUD (peptic ulcer disease)    Wears dentures    full upper, partial lower    Past Surgical History:  Procedure Laterality Date   AMPUTATION TOE Left 12/14/2017   Procedure: AMPUTATION TOE-4TH MPJ;  Surgeon: Gwyneth Revels, DPM;  Location: [facility];  Service: Podiatry;  Laterality: Left;  IVA LOCAL Diabetic - oral meds   APPENDECTOMY     CATARACT EXTRACTION W/PHACO Right 06/23/2015   Procedure: CATARACT EXTRACTION PHACO AND INTRAOCULAR LENS PLACEMENT (IOC);  Surgeon: Sallee Lange, MD;  Location: [facility];  Service: Ophthalmology;  Laterality: Right;  [place] 01:28 AP% 23.4 CDE 36.14 fluid pack lot # [id number]   CATARACT EXTRACTION W/PHACO Left 07/28/2015   Procedure: CATARACT EXTRACTION PHACO AND INTRAOCULAR LENS PLACEMENT (IOC);  Surgeon: Sallee Lange, MD;  Location: [facility];  Service: Ophthalmology;  Laterality: Left;  [place]    1:29.7 AP%  24.9 CDE   41.32 fluid casette lot #[id number] H exp 09/23/2016   COONOSCOPY     AND ENDOSCOPY   DILATION AND CURETTAGE OF UTERUS     TONSILLECTOMY     TUBAL LIGATION      Family History  Problem Relation Age of Onset   Breast cancer Neg Hx     Social History:  reports that she quit smoking about 48  years ago. Her smoking use included cigarettes. She has never used smokeless tobacco. She reports that she does not drink alcohol. No history on file for drug use.   Allergies  Allergen Reactions   Penicillins Hives   Sulfa Antibiotics Hives   Tetanus Toxoids Swelling   Procaine Other (See Comments)    "went into shock"   Tuberculin Tests Swelling and Rash    Medications Prior to Admission  Medication Sig Dispense Refill   fosinopril (MONOPRIL) 10 MG tablet Take 10 mg by mouth at  bedtime.     iron polysaccharides (NIFEREX) 150 MG capsule Take 150 mg by mouth daily.     levothyroxine (SYNTHROID, LEVOTHROID) 88 MCG tablet Take 88 mcg by mouth daily before breakfast.     metFORMIN (GLUCOPHAGE-XR) 500 MG 24 hr tablet Take 500 mg by mouth at bedtime.     Multiple Vitamins-Minerals (ICAPS AREDS 2 PO) Take by mouth 2 (two) times daily. Reported on 06/23/2015     simvastatin (ZOCOR) 20 MG tablet Take 20 mg by mouth at bedtime.     vitamin B-12 (CYANOCOBALAMIN) 1000 MCG tablet Take 2,000 mcg by mouth daily.      glucose blood test strip USE TO TEST BLOOD SUGAR DAILY. DX CODE: E11.9     HYDROcodone-acetaminophen (NORCO) 5-325 MG tablet Take 1 tablet by mouth every 6 (six) hours as needed for moderate pain. (Patient not taking: No sig reported) 20 tablet 0    Drug Regimen Review  Drug regimen was reviewed and remains appropriate with no significant issues identified  Home: Home Living Family/patient expects to be discharged to:: Private residence Living Arrangements: Children Available Help at Discharge: Family, Available 24 hours/day Type of Home: House Home Access: Ramped entrance Home Layout: One level Bathroom Shower/Tub: Tub/shower unit Home Equipment: Gilmer Mor - single point Additional Comments: Pt goes to senior center 2x/wk for exercise.  Lives With: Daughter   Functional History: Prior Function Level of Independence: Independent with assistive device(s) Comments: Pt reports being I with self care tasks, mobility, and IADL tasks. Pt uses SPC for community mobility but not within the home.  Active, enjoys yoga/exercise at senior center 1-2x/week  Functional Status:  Mobility: Bed Mobility Overal bed mobility: Needs Assistance Bed Mobility: Supine to Sit, Sit to Supine Supine to sit: Min assist Sit to supine: Min assist General bed mobility comments: Pt was in recliner pre/post session Transfers Overall transfer level: Needs assistance Equipment used:  None Transfers: Sit to/from Stand, Stand Pivot Transfers Sit to Stand: Mod assist Stand pivot transfers: Mod assist General transfer comment: pt needing assistance to weight shift and mod multimodal cuing for sequencing forward step Ambulation/Gait Ambulation/Gait assistance: Min assist, Mod assist Gait Distance (Feet): 45 Feet Assistive device: Rolling walker (2 wheeled) Gait Pattern/deviations: Step-to pattern, Scissoring, Staggering left, Staggering right, Ataxic General Gait Details: Pt required use of AD today due to severe balance deficits. unsafe to trial without AD. more assistance required to prevent falling. has ~ 5 episodes of scissoring with intervention to prevent falling. Fatigues extremely quickly. constant vcs throughout for wt shift to allow RLE advancement. does have some ataxic movements. required use of RUE hand splint to maintain grasp on RW. Gait velocity: decreased    ADL: ADL Overall ADL's : Needs assistance/impaired Lower Body Dressing: Moderate assistance Lower Body Dressing Details (indicate cue type and reason): don/doff B socks. Difficulty grasping with R UE and R lateral lean while seated attempting to complete tasks. General ADL Comments: supervision - min A  for functional mobility nd self care tasks without use of AD.  Cognition: Cognition Overall Cognitive Status: Within Functional Limits for tasks assessed Orientation Level: Oriented X4 Cognition Arousal/Alertness: Awake/alert Behavior During Therapy: WFL for tasks assessed/performed Overall Cognitive Status: Within Functional Limits for tasks assessed General Comments: Pt with difficulty word finding and slurred speech this session. Pt remains pleasant and cooperative overall.   Blood pressure (!) 143/60, pulse 72, temperature 98.6 F (37 C), resp. rate 17, height 5\' 3"  (1.6 m), weight 55.2 kg, SpO2 98 %. Physical Exam Constitutional:      General: She is not in acute distress.    Appearance:  She is not ill-appearing.  HENT:     Head: Normocephalic and atraumatic.     Right Ear: External ear normal.     Left Ear: External ear normal.     Nose: Nose normal.     Mouth/Throat:     Mouth: Mucous membranes are moist.     Pharynx: Oropharynx is clear.  Eyes:     Extraocular Movements: Extraocular movements intact.     Pupils: Pupils are equal, round, and reactive to light.  Cardiovascular:     Rate and Rhythm: Normal rate.     Heart sounds: No murmur heard.   No gallop.  Pulmonary:     Effort: Pulmonary effort is normal. No respiratory distress.     Breath sounds: Normal breath sounds. No wheezing.  Abdominal:     General: Bowel sounds are normal. There is no distension.     Palpations: Abdomen is soft.     Tenderness: There is no abdominal tenderness.  Musculoskeletal:        General: No swelling or tenderness.     Cervical back: Normal range of motion.  Skin:    General: Skin is warm.     Comments: Skin tan from sun, a lot of sun related pigmentations  Neurological:     Mental Status: She is alert.     Comments: Alert and oriented x 3. Normal insight and awareness. Intact Memory. Normal language and speech. Cranial nerve exam unremarkable except for mild dysarthria and right central 7. RUE 4/5 delt, 4- biceps, tricep and 3+ wrist and HI. RLE 4/5 prox to distal. LUE and LLE 4-5/5. Has mild sensory loss along tips of digits of right hand (chronic). No other sensory findings. No abnl tone.   Psychiatric:        Mood and Affect: Mood normal.        Behavior: Behavior normal.    Results for orders placed or performed during the hospital encounter of 10/19/20 (from the past 48 hour(s))  Glucose, capillary     Status: Abnormal   Collection Time: 10/20/20  8:40 PM  Result Value Ref Range   Glucose-Capillary 120 (H) 70 - 99 mg/dL    Comment: Glucose reference range applies only to samples taken after fasting for at least 8 hours.  CBC     Status: Abnormal   Collection Time:  10/21/20  4:05 AM  Result Value Ref Range   WBC 12.3 (H) 4.0 - 10.5 K/uL   RBC 3.68 (L) 3.87 - 5.11 MIL/uL   Hemoglobin 11.6 (L) 12.0 - 15.0 g/dL   HCT 16.134.7 (L) 09.636.0 - 04.546.0 %   MCV 94.3 80.0 - 100.0 fL   MCH 31.5 26.0 - 34.0 pg   MCHC 33.4 30.0 - 36.0 g/dL   RDW 40.912.1 81.111.5 - 91.415.5 %   Platelets 220 150 - 400  K/uL   nRBC 0.0 0.0 - 0.2 %    Comment: Performed at [facility], [address] [place], [place] [address]  Basic metabolic panel     Status: Abnormal   Collection Time: 10/21/20  4:05 AM  Result Value Ref Range   Sodium 138 135 - 145 mmol/L   Potassium 4.7 3.5 - 5.1 mmol/L   Chloride 109 98 - 111 mmol/L   CO2 23 22 - 32 mmol/L   Glucose, Bld 141 (H) 70 - 99 mg/dL    Comment: Glucose reference range applies only to samples taken after fasting for at least 8 hours.   BUN 27 (H) 8 - 23 mg/dL   Creatinine, Ser 6.04 (H) 0.44 - 1.00 mg/dL   Calcium 9.0 8.9 - 54.0 mg/dL   GFR, Estimated 33 (L) >60 mL/min    Comment: (NOTE) Calculated using the CKD-EPI Creatinine Equation (2021)    Anion gap 6 5 - 15    Comment: Performed at [facility], [address] [place], [place] [address]  Glucose, capillary     Status: Abnormal   Collection Time: 10/21/20  7:46 AM  Result Value Ref Range   Glucose-Capillary 128 (H) 70 - 99 mg/dL    Comment: Glucose reference range applies only to samples taken after fasting for at least 8 hours.  Glucose, capillary     Status: Abnormal   Collection Time: 10/21/20  8:30 PM  Result Value Ref Range   Glucose-Capillary 283 (H) 70 - 99 mg/dL    Comment: Glucose reference range applies only to samples taken after fasting for at least 8 hours.  Basic metabolic panel     Status: Abnormal   Collection Time: 10/22/20  4:27 AM  Result Value Ref Range   Sodium 139 135 - 145 mmol/L   Potassium 4.3 3.5 - 5.1 mmol/L   Chloride 108 98 - 111 mmol/L   CO2 22 22 - 32 mmol/L   Glucose, Bld 125 (H) 70 - 99 mg/dL    Comment: Glucose reference  range applies only to samples taken after fasting for at least 8 hours.   BUN 31 (H) 8 - 23 mg/dL   Creatinine, Ser 1.47 (H) 0.44 - 1.00 mg/dL   Calcium 9.1 8.9 - 82.9 mg/dL   GFR, Estimated 31 (L) >60 mL/min    Comment: (NOTE) Calculated using the CKD-EPI Creatinine Equation (2021)    Anion gap 9 5 - 15    Comment: Performed at [facility], [address] [place], [place] [address]  Glucose, capillary     Status: Abnormal   Collection Time: 10/22/20  8:37 AM  Result Value Ref Range   Glucose-Capillary 144 (H) 70 - 99 mg/dL    Comment: Glucose reference range applies only to samples taken after fasting for at least 8 hours.   MR BRAIN WO CONTRAST  Result Date: 10/21/2020 CLINICAL DATA:  Stroke follow-up. EXAM: MRI HEAD WITHOUT CONTRAST TECHNIQUE: Multiplanar, multiecho pulse sequences of the brain and surrounding structures were obtained without intravenous contrast. COMPARISON:  MRI of the brain October 20, 2020. FINDINGS: Brain: Interval progression of area of restricted diffusion involving the left ventrolateral thalamus, measuring approximately 15 mm, consistent with acute infarct. No hemorrhage, hydrocephalus, extra-axial collection or mass lesion. Remote lacunar infarct in the right thalamus. Small amount of scattered foci of T2 hyperintensity are seen within the white matter of the cerebral hemispheres and within the pons, nonspecific, most likely related to chronic small vessel  ischemia. Mild parenchymal volume loss. Vascular: Absent flow void in the V3 and V4 segments of the right vertebral artery may be related to slow flow versus occlusion. Skull and upper cervical spine: Normal marrow signal. Sinuses/Orbits: Bilateral lens surgery. Paranasal sinuses are clear. Other: None. IMPRESSION: Significant interval progression of the area of restricted diffusion within the left ventrolateral thalamus consistent with acute infarct, now measuring 15 mm. Electronically Signed   By: Baldemar Lenis M.D.   On: 10/21/2020 15:32   US Carotid Bilateral  Result Date: 10/21/2020 CLINICAL DATA:  87 year old female with a history of stroke EXAM: BILATERAL CAROTID DUPLEX ULTRASOUND TECHNIQUE: Wallace Cullens scale imaging, color Doppler and duplex ultrasound were performed of bilateral carotid and vertebral arteries in the neck. COMPARISON:  None. FINDINGS: Criteria: Quantification of carotid stenosis is based on velocity parameters that correlate the residual internal carotid diameter with NASCET-based stenosis levels, using the diameter of the distal internal carotid lumen as the denominator for stenosis measurement. The following velocity measurements were obtained: RIGHT ICA:  Systolic 82 cm/sec, Diastolic 19 cm/sec CCA:  60 cm/sec SYSTOLIC ICA/CCA RATIO:  1.4 ECA:  225 cm/sec LEFT ICA:  Systolic 106 cm/sec, Diastolic 19 cm/sec CCA:  75 cm/sec SYSTOLIC ICA/CCA RATIO:  1.4 ECA:  192 cm/sec Right Brachial SBP: Not acquired Left Brachial SBP: Not acquired RIGHT CAROTID ARTERY: No significant calcifications of the right common carotid artery. Intermediate waveform maintained. Moderate heterogeneous and partially calcified plaque at the right carotid bifurcation. No significant lumen shadowing. Low resistance waveform of the right ICA. No significant tortuosity. RIGHT VERTEBRAL ARTERY: Antegrade flow with low resistance waveform. LEFT CAROTID ARTERY: No significant calcifications of the left common carotid artery. Intermediate waveform maintained. Moderate heterogeneous and partially calcified plaque at the left carotid bifurcation. No significant lumen shadowing. Low resistance waveform of the left ICA. No significant tortuosity. LEFT VERTEBRAL ARTERY:  Antegrade flow with low resistance waveform. IMPRESSION: Color duplex indicates moderate heterogeneous and calcified plaque, with no hemodynamically significant stenosis by duplex criteria in the extracranial cerebrovascular circulation. Signed, Yvone Neu.  Reyne Dumas, RPVI [facility] Electronically Signed   By: Gilmer Mor D.O.   On: 10/21/2020 16:03   ECHOCARDIOGRAM COMPLETE BUBBLE STUDY  Result Date: 10/20/2020    ECHOCARDIOGRAM REPORT   Patient Name:   Laquanta ANN Meter Date of Exam: 10/20/2020 Medical Rec #:  [id number]       Height:       63.0 in Accession #:    [id number]      Weight:       129.4 lb Date of Birth:  [birth year]       BSA:          1.607 m Patient Age:    89 years        BP:           168/62 mmHg Patient Gender: F               HR:           83 bpm. Exam Location:  [facility] Procedure: 2D Echo, Cardiac Doppler, Color Doppler and Saline Contrast Bubble            Study Indications:     TIA 434.9 / G45.9  History:         Patient has no prior history of Echocardiogram examinations.                  Risk Factors:Hypertension  and Diabetes.  Sonographer:     Cristela Blue RDCS (AE) Referring Phys:  [id number] Angie Fava Diagnosing Phys: Lorine Bears MD  Sonographer Comments: Suboptimal parasternal window. IMPRESSIONS  1. Left ventricular ejection fraction, by estimation, is 55 to 60%. The left ventricle has normal function. Left ventricular endocardial border not optimally defined to evaluate regional wall motion. There is mild left ventricular hypertrophy. Left ventricular diastolic parameters are indeterminate.  2. Right ventricular systolic function is normal. The right ventricular size is normal. There is mildly elevated pulmonary artery systolic pressure.  3. The mitral valve is normal in structure. Mild mitral valve regurgitation. No evidence of mitral stenosis. Moderate mitral annular calcification.  4. The aortic valve is normal in structure. Aortic valve regurgitation is not visualized. Mild to moderate aortic valve sclerosis/calcification is present, without any evidence of aortic stenosis.  5. The inferior vena cava is normal in size with greater than 50% respiratory variability,  suggesting right atrial pressure of 3 mmHg.  6. Agitated saline contrast bubble study was negative, with no evidence of any interatrial shunt. FINDINGS  Left Ventricle: Left ventricular ejection fraction, by estimation, is 55 to 60%. The left ventricle has normal function. Left ventricular endocardial border not optimally defined to evaluate regional wall motion. The left ventricular internal cavity size was normal in size. There is mild left ventricular hypertrophy. Left ventricular diastolic parameters are indeterminate. Right Ventricle: The right ventricular size is normal. No increase in right ventricular wall thickness. Right ventricular systolic function is normal. There is mildly elevated pulmonary artery systolic pressure. The tricuspid regurgitant velocity is 3.18  m/s, and with an assumed right atrial pressure of 3 mmHg, the estimated right ventricular systolic pressure is 43.4 mmHg. Left Atrium: Left atrial size was normal in size. Right Atrium: Right atrial size was normal in size. Pericardium: There is no evidence of pericardial effusion. Mitral Valve: The mitral valve is normal in structure. Moderate mitral annular calcification. Mild mitral valve regurgitation. No evidence of mitral valve stenosis. Tricuspid Valve: The tricuspid valve is normal in structure. Tricuspid valve regurgitation is trivial. No evidence of tricuspid stenosis. Aortic Valve: The aortic valve is normal in structure. Aortic valve regurgitation is not visualized. Mild to moderate aortic valve sclerosis/calcification is present, without any evidence of aortic stenosis. Aortic valve mean gradient measures 4.5 mmHg. Aortic valve peak gradient measures 9.4 mmHg. Aortic valve area, by VTI measures 2.66 cm. Pulmonic Valve: The pulmonic valve was normal in structure. Pulmonic valve regurgitation is not visualized. No evidence of pulmonic stenosis. Aorta: The aortic root is normal in size and structure. Venous: The inferior vena cava is  normal in size with greater than 50% respiratory variability, suggesting right atrial pressure of 3 mmHg. IAS/Shunts: No atrial level shunt detected by color flow Doppler. Agitated saline contrast was given intravenously to evaluate for intracardiac shunting. Agitated saline contrast bubble study was negative, with no evidence of any interatrial shunt.  LEFT VENTRICLE PLAX 2D LVIDd:         2.75 cm  Diastology LVIDs:         2.02 cm  LV e' medial:    6.31 cm/s LV PW:         1.18 cm  LV E/e' medial:  18.5 LV IVS:        0.90 cm  LV e' lateral:   6.85 cm/s LVOT diam:     2.00 cm  LV E/e' lateral: 17.1 LV SV:  85 LV SV Index:   53 LVOT Area:     3.14 cm  RIGHT VENTRICLE RV Basal diam:  2.97 cm RV S prime:     16.90 cm/s TAPSE (M-mode): 3.2 cm LEFT ATRIUM           Index       RIGHT ATRIUM          Index LA diam:      2.60 cm 1.62 cm/m  RA Area:     7.99 cm LA Vol (A2C): 24.4 ml 15.18 ml/m RA Volume:   13.80 ml 8.59 ml/m LA Vol (A4C): 26.3 ml 16.37 ml/m  AORTIC VALVE                    PULMONIC VALVE AV Area (Vmax):    2.42 cm     PV Vmax:        0.37 m/s AV Area (Vmean):   2.12 cm     PV Peak grad:   0.5 mmHg AV Area (VTI):     2.66 cm     RVOT Peak grad: 1 mmHg AV Vmax:           153.00 cm/s AV Vmean:          101.250 cm/s AV VTI:            0.320 m AV Peak Grad:      9.4 mmHg AV Mean Grad:      4.5 mmHg LVOT Vmax:         118.00 cm/s LVOT Vmean:        68.400 cm/s LVOT VTI:          0.271 m LVOT/AV VTI ratio: 0.85  AORTA Ao Root diam: 2.77 cm MITRAL VALVE                TRICUSPID VALVE MV Area (PHT): 3.60 cm     TR Peak grad:   40.4 mmHg MV Decel Time: 211 msec     TR Vmax:        318.00 cm/s MV E velocity: 117.00 cm/s MV A velocity: 138.00 cm/s  SHUNTS MV E/A ratio:  0.85         Systemic VTI:  0.27 m                             Systemic Diam: 2.00 cm Lorine Bears MD Electronically signed by Lorine Bears MD Signature Date/Time: 10/20/2020/1:44:50 PM    Final        Medical Problem List and  Plan: 1.  Functional and mobility deficits secondary to left basal ganglia infarct  -patient may shower  -ELOS/Goals: 7-10 days, mod I goals with PT, OT, SLP 2.  Antithrombotics: -DVT/anticoagulation:  Pharmaceutical: Lovenox  -antiplatelet therapy: ASA and plavix 3. Pain Management: Tylenol prn 4. Mood: LCSW to follow for evaluation and support  -antipsychotic agents: N/AA 5. Neuropsych: This patient is capable of making decisions on her own behalf. 6. Skin/Wound Care: Routine pressure-relief measures 7. Fluids/Electrolytes/Nutrition: Monitor intake/output.   --Check CMet on 07/04 -pt reports good appetite. Does struggle to drink a lot at baseline 8.  HTN: Monitor blood pressures 3 times daily.  Avoid hypotension. 9.  T2DM: Resume metformin.  Monitor blood sugars ac/hs  --Use SSI for elevated blood sugar and titrate medication as indicated. 10. L- CAS: Continue DAPT--Plavix to stop 5 days prior to procedure.  -- Follow-up with Dr. Wyn Quaker  for surgery on outpatient basis. 11.  CKD: Baseline SCR 1.4-1.7 range  -- Monitor renal status with routine checks.        Jacquelynn Cree, PA-C 10/22/2020

## 2020-10-22 NOTE — TOC Progression Note (Signed)
Transition of Care [facility]) - Progression Note    Patient Details  Name: Cassandra Schaefer MRN: [id number] Date of Birth: [birth year]  [facility] CM/SW Contact  Caryn Section, RN Phone Number: 10/22/2020, 1:49 PM  Clinical Narrative:   [name] evaluated patient, they are in the process of getting authorization from [employer] to transfer to [name].  This authorization process will take 48-72 hours.           Expected Discharge Plan and Services                                                 Social Determinants of Health (SDOH) Interventions    Readmission Risk Interventions No flowsheet data found.

## 2020-10-22 NOTE — Consult Note (Signed)
Cardiology Consultation:   Patient ID: Cassandra Schaefer MRN: [id number]; DOB: [birth year]  Admit date: 10/19/2020 Date of Consult: 10/22/2020  PCP:  Barbette ReichmannHande, Vishwanath, MD   [facility] Providers Cardiologist:  [place] rounding  Patient Profile:   Cassandra Schaefer is a 87 y.o. female with a hx of hypertension, hyperlipidemia, diabetes who is being seen 10/22/2020 for the evaluation of PVCs, preop evaluation at the request of Dr. Denton LankGriffith.  History of Present Illness:   Cassandra Schaefer is a 87 year old female with history of hypertension, hyperlipidemia, former smoker x20+ years, diabetes presenting to the hospital due to right-sided weakness.  Patient states symptoms began 3 days ago when she got home from church.  She felt weak, had difficulty taking off her clothes.  She took a nap and upon waking up, she had some difficulty moving her right leg and both arms, right arm was weaker.  She tried walking but this was also difficult.  She alerted her daughter, who is an Charity fundraiserN, checked her pulse and recommended she come to the ED.  Upon presentation, work-up with head CT and MRI revealed a left lacunar infarct.  CTA head and neck revealed significant stenosis in the left internal carotid artery.  Subsequent MRI of the brain revealed interval increase in infarct size from 5 to 15 mm.  Due to change in size, procedures being halted for about 1 to 2 weeks per vascular team.  Telemetry so far in-house has not shown any atrial fibrillation or atrial flutter, frequent PACs, PVCs noted.  She denies any history of heart disease, irregular heartbeats, chest pain or shortness of breath.   Past Medical History:  Diagnosis Date   Anemia    Cough    RESOLVING, NO FEVER   Diabetes mellitus without complication (HCC)    Gout    Hypertension    Hypothyroidism    PUD (peptic ulcer disease)    Wears dentures    full upper, partial lower    Past Surgical History:  Procedure Laterality Date    AMPUTATION TOE Left 12/14/2017   Procedure: AMPUTATION TOE-4TH MPJ;  Surgeon: Gwyneth RevelsFowler, Justin, DPM;  [facility];  Service: Podiatry;  Laterality: Left;  IVA LOCAL Diabetic - oral meds   APPENDECTOMY     CATARACT EXTRACTION W/PHACO Right 06/23/2015   Procedure: CATARACT EXTRACTION PHACO AND INTRAOCULAR LENS PLACEMENT (IOC);  Surgeon: Sallee LangeSteven Dingeldein, MD;  Location: [facility];  Service: Ophthalmology;  Laterality: Right;  US 01:28 AP% 23.4 CDE 36.14 fluid pack lot # [id number]   CATARACT EXTRACTION W/PHACO Left 07/28/2015   Procedure: CATARACT EXTRACTION PHACO AND INTRAOCULAR LENS PLACEMENT (IOC);  Surgeon: Sallee LangeSteven Dingeldein, MD;  Location: [facility];  Service: Ophthalmology;  Laterality: Left;  US    1:29.7 AP%  24.9 CDE   41.32 fluid casette lot #[id number] H exp 09/23/2016   COONOSCOPY     AND ENDOSCOPY   DILATION AND CURETTAGE OF UTERUS     TONSILLECTOMY     TUBAL LIGATION       Home Medications:  Prior to Admission medications   Medication Sig Start Date End Date Taking? Authorizing Provider  fosinopril (MONOPRIL) 10 MG tablet Take 10 mg by mouth at bedtime.   Yes [provider]  iron polysaccharides (NIFEREX) 150 MG capsule Take 150 mg by mouth daily. 12/02/16 10/21/20 Yes [provider]  levothyroxine (SYNTHROID, LEVOTHROID) 88 MCG tablet Take 88 mcg by mouth daily before breakfast.   Yes [provider]  metFORMIN (GLUCOPHAGE-XR) 500  MG 24 hr tablet Take 500 mg by mouth at bedtime.   Yes [provider]  Multiple Vitamins-Minerals (ICAPS AREDS 2 PO) Take by mouth 2 (two) times daily. Reported on 06/23/2015   Yes [provider]  simvastatin (ZOCOR) 20 MG tablet Take 20 mg by mouth at bedtime.   Yes [provider]  vitamin B-12 (CYANOCOBALAMIN) 1000 MCG tablet Take 2,000 mcg by mouth daily.    Yes [provider]  glucose blood test strip USE TO TEST BLOOD SUGAR DAILY. DX CODE: E11.9 05/24/16   [provider]  HYDROcodone-acetaminophen (NORCO) 5-325 MG tablet Take 1 tablet by mouth every 6 (six) hours as needed for moderate pain. Patient not taking: No sig reported 12/14/17   Gwyneth Revels, DPM    Inpatient Medications: Scheduled Meds:  aspirin  81 mg Oral Daily   atorvastatin  80 mg Oral Daily   clopidogrel  75 mg Oral Daily   insulin aspart  0-5 Units Subcutaneous QHS   levothyroxine  88 mcg Oral Q0600   Continuous Infusions:  PRN Meds: acetaminophen **OR** acetaminophen, hydrALAZINE  Allergies:    Allergies  Allergen Reactions   Penicillins Hives   Sulfa Antibiotics Hives   Tetanus Toxoids Swelling   Procaine Other (See Comments)    "went into shock"   Tuberculin Tests Swelling and Rash    Social History:   Social History   Socioeconomic History   Marital status: Widowed    Spouse name: Not on file   Number of children: Not on file   Years of education: Not on file   Highest education level: Not on file  Occupational History   Not on file  Tobacco Use   Smoking status: Former    Pack years: 0.00    Types: Cigarettes    Quit date: 12/26/1983    Years since quitting: 36.8   Smokeless tobacco: Never  Vaping Use   Vaping Use: Never used  Substance and Sexual Activity   Alcohol use: No   Drug use: Not on file   Sexual activity: Not on file  Other Topics Concern   Not on file  Social History Narrative   Not on file   Social Determinants of Health   Financial Resource Strain: Not on file  Food Insecurity: Not on file  Transportation Needs: Not on file  Physical Activity: Not on file  Stress: Not on file  Social Connections: Not on file  Intimate Partner Violence: Not on file    Family History:    Family History  Problem Relation Age of Onset   Breast cancer Neg Hx      ROS:  Please see the history of present illness.   All other ROS reviewed and negative.     Physical Exam/Data:   Vitals:   10/22/20 0045 10/22/20 0158 10/22/20  0355 10/22/20 0816  BP:  (!) 104/53 (!) 140/49 (!) 143/60  Pulse: 66 78 67 72  Resp: 16 14 17 17   Temp:   98.5 F (36.9 C) 98.6 F (37 C)  TempSrc:   Oral   SpO2: 94% 93% 94% 98%  Weight:      Height:        Intake/Output Summary (Last 24 hours) at 10/22/2020 1533 Last data filed at 10/22/2020 1419 Gross per 24 hour  Intake 0 ml  Output --  Net 0 ml   Last 3 Weights 10/21/2020 10/20/2020 10/20/2020  Weight (lbs) 121 lb 11.1 oz 129 lb 6.6 oz  129 lb 6.6 oz  Weight (kg) 55.2 kg 58.7 kg 58.7 kg     Body mass index is 21.56 kg/m.  General:  Well nourished, well developed, in no acute distress HEENT: normal Lymph: no adenopathy Neck: no JVD Endocrine:  No thryomegaly Vascular: No carotid bruits; FA pulses 2+ bilaterally without bruits  Cardiac:  normal S1, S2; RRR; no murmur  Lungs:  clear to auscultation bilaterally, no wheezing, rhonchi or rales  Abd: soft, nontender, no hepatomegaly  Ext: no edema Musculoskeletal:  No deformities, right-sided weakness noted Neuro: Right-sided weakness noted, normal speech Psych:  Normal affect   EKG:  The EKG was personally reviewed and demonstrates: Normal sinus rhythm, normal ECG Telemetry:  Telemetry was personally reviewed and demonstrates: Sinus rhythm, occasional PACs  Relevant CV Studies: Echocardiogram 09/2020 1. Left ventricular ejection fraction, by estimation, is 55 to 60%. The  left ventricle has normal function. Left ventricular endocardial border  not optimally defined to evaluate regional wall motion. There is mild left  ventricular hypertrophy. Left  ventricular diastolic parameters are indeterminate.   2. Right ventricular systolic function is normal. The right ventricular  size is normal. There is mildly elevated pulmonary artery systolic  pressure.   3. The mitral valve is normal in structure. Mild mitral valve  regurgitation. No evidence of mitral stenosis. Moderate mitral annular  calcification.   4. The aortic  valve is normal in structure. Aortic valve regurgitation is  not visualized. Mild to moderate aortic valve sclerosis/calcification is  present, without any evidence of aortic stenosis.   5. The inferior vena cava is normal in size with greater than 50%  respiratory variability, suggesting right atrial pressure of 3 mmHg.   6. Agitated saline contrast bubble study was negative, with no evidence  of any interatrial shunt.   Laboratory Data:  High Sensitivity Troponin:  No results for input(s): TROPONINIHS in the last 720 hours.   Chemistry Recent Labs  Lab 10/20/20 0423 10/21/20 0405 10/22/20 0427  NA 140 138 139  K 4.2 4.7 4.3  CL 111 109 108  CO2 GLUCOSE 144* 141* 125*  BUN 26* 27* 31*  CREATININE 1.34* 1.51* 1.57*  CALCIUM 9.0 9.0 9.1  GFRNONAA 38* 33* 31*  ANIONGAP Recent Labs  Lab 10/19/20 2101 10/20/20 0423  PROT 7.3 6.5  ALBUMIN 4.1 3.5  AST 18 16  ALT 12 11  ALKPHOS 103 83  BILITOT 0.7 0.5   Hematology Recent Labs  Lab 10/19/20 2101 10/20/20 0423 10/21/20 0405  WBC 8.1 6.7 12.3*  RBC 3.76* 3.47* 3.68*  HGB 11.9* 10.7* 11.6*  HCT 35.7* 32.9* 34.7*  MCV 94.9 94.8 94.3  MCH 31.6 30.8 31.5  MCHC 33.3 32.5 33.4  RDW 12.3 12.1 12.1  PLT 242 206 220   BNPNo results for input(s): BNP, PROBNP in the last 168 hours.  DDimer No results for input(s): DDIMER in the last 168 hours.   Radiology/Studies:  CT ANGIO HEAD NECK W WO CM  Result Date: 10/19/2020 CLINICAL DATA:  Initial evaluation for acute right-sided weakness, speech difficulty. EXAM: CT ANGIOGRAPHY HEAD AND NECK TECHNIQUE: Multidetector CT imaging of the head and neck was performed using the standard protocol during bolus administration of intravenous contrast. Multiplanar CT image reconstructions and MIPs were obtained to evaluate the vascular anatomy. Carotid stenosis measurements (when applicable) are obtained utilizing NASCET criteria, using the distal internal carotid diameter  as the denominator. CONTRAST:  75mL OMNIPAQUE IOHEXOL  350 MG/ML SOLN COMPARISON:  None. FINDINGS: CT HEAD FINDINGS Brain: Generalized age-related cerebral atrophy. Few small remote lacunar infarcts noted about the right basal ganglia and right thalamus. No acute intracranial hemorrhage. No acute large vessel territory infarct. No mass lesion, midline shift or mass effect. No hydrocephalus or extra-axial fluid collection. Vascular: No hyperdense vessel. Scattered vascular calcifications noted within the carotid siphons. Skull: Scalp soft tissues demonstrate no acute finding. Calvarium intact. Sinuses: Changes of chronic sinusitis noted. No active disease. No mastoid effusion. Orbits:  Globes orbital soft tissues demonstrate no acute finding. CTA NECK FINDINGS Aortic arch: Visualized aortic arch normal caliber with normal 3 vessel morphology. Moderate atheromatous change about the arch and origin of the great vessels without high-grade stenosis. Right carotid system: Right CCA patent from its origin to the bifurcation without stenosis. Eccentric mixed plaque at the right carotid bulb/proximal right ICA with associated mild 35% stenosis by NASCET criteria. Right ICA patent distally without stenosis, dissection or occlusion. Left carotid system: The CCA patent from its origin to the bifurcation without stenosis. Bulky calcified plaque about the left carotid bulb with associated stenosis of up to 70% by NASCET criteria. Left ICA patent distally without stenosis, dissection or occlusion. Vertebral arteries: Both vertebral arteries arise from subclavian arteries. No significant proximal subclavian artery stenosis. Strongly dominant left vertebral artery with a diffusely hypoplastic right vertebral artery. Atheromatous plaque at the origin of the dominant left vertebral artery with associated severe stenosis (series 10, image 77). Dominant left vertebral artery otherwise patent within the neck. Focal severe distal right V3  stenosis noted just prior to cranial vault (series 10, image 196). Skeleton: No visible acute osseous finding. No discrete or worrisome osseous lesions. Other neck: No other acute soft tissue abnormality within the neck. No mass or adenopathy. Upper chest: Scattered interlobular septal thickening noted within the visualized lungs, suggesting mild pulmonary interstitial edema. Mildly prominent precarinal node measures up to the upper limits of normal at 1 cm. Visualized upper chest demonstrates no other acute finding. Review of the MIP images confirms the above findings CTA HEAD FINDINGS Anterior circulation: Petrous segments patent bilaterally. Scattered atheromatous change within the carotid siphons without high-grade stenosis. A1 segments patent bilaterally. Normal anterior communicating artery complex. Anterior cerebral arteries patent to their distal aspects without stenosis. No M1 stenosis or occlusion. Normal MCA bifurcations. Distal MCA branches well perfused and symmetric. Posterior circulation: Dominant left vertebral artery widely patent to the vertebrobasilar junction. Right vertebral artery markedly hypoplastic and irregular, but is grossly patent to the vertebrobasilar junction as well. Right PICA origin patent. Left PICA not seen. Basilar patent to its distal aspect without stenosis. Superior cerebral arteries patent bilaterally. Left PCA supplied via the basilar and is widely patent to its distal aspect. Fetal type origin of the right PCA. Atheromatous change throughout the right PCA with associated moderate multifocal P2/P3 stenoses. Venous sinuses: Grossly patent allowing for timing the contrast bolus. Anatomic variants: Fetal type origin of the right PCA.  No aneurysm. Review of the MIP images confirms the above findings IMPRESSION: CT HEAD IMPRESSION: 1. No acute intracranial abnormality. 2. Few small remote lacunar infarcts about the right basal ganglia and thalamus. CTA HEAD AND NECK IMPRESSION:  1. Negative CTA for emergent large vessel occlusion. 2. Bulky calcified plaque about the left carotid bulb with associated stenosis of up to 70% by NASCET criteria. 3. Atheromatous plaque at the origin of the dominant left vertebral artery with associated severe stenosis. Strongly dominant left vertebral artery otherwise  patent within the neck. 4. Atheromatous change throughout the right PCA with associated moderate multifocal P2/P3 stenoses. 5. Mild pulmonary interstitial edema. Electronically Signed   By: Rise Mu M.D.   On: 10/19/2020 22:55   MR BRAIN WO CONTRAST  Result Date: 10/21/2020 CLINICAL DATA:  Stroke follow-up. EXAM: MRI HEAD WITHOUT CONTRAST TECHNIQUE: Multiplanar, multiecho pulse sequences of the brain and surrounding structures were obtained without intravenous contrast. COMPARISON:  MRI of the brain October 20, 2020. FINDINGS: Brain: Interval progression of area of restricted diffusion involving the left ventrolateral thalamus, measuring approximately 15 mm, consistent with acute infarct. No hemorrhage, hydrocephalus, extra-axial collection or mass lesion. Remote lacunar infarct in the right thalamus. Small amount of scattered foci of T2 hyperintensity are seen within the white matter of the cerebral hemispheres and within the pons, nonspecific, most likely related to chronic small vessel ischemia. Mild parenchymal volume loss. Vascular: Absent flow void in the V3 and V4 segments of the right vertebral artery may be related to slow flow versus occlusion. Skull and upper cervical spine: Normal marrow signal. Sinuses/Orbits: Bilateral lens surgery. Paranasal sinuses are clear. Other: None. IMPRESSION: Significant interval progression of the area of restricted diffusion within the left ventrolateral thalamus consistent with acute infarct, now measuring 15 mm. Electronically Signed   By: Baldemar Lenis M.D.   On: 10/21/2020 15:32   MR BRAIN WO CONTRAST  Result Date:  10/20/2020 CLINICAL DATA:  Initial evaluation for neuro deficit, stroke suspected. Acute right-sided weakness. EXAM: MRI HEAD WITHOUT CONTRAST TECHNIQUE: Multiplanar, multiecho pulse sequences of the brain and surrounding structures were obtained without intravenous contrast. COMPARISON:  Prior CTA from 10/19/2020. FINDINGS: Brain: Mild diffuse prominence of the CSF containing spaces compatible generalized cerebral atrophy. Scattered patchy T2/FLAIR hyperintensity within the periventricular and deep white matter both cerebral hemispheres as well as the pons, most consistent with chronic small vessel ischemic disease, mild for age. Few scattered remote lacunar infarcts present about the basal ganglia and thalami. There is a subtle punctate 5 mm focus of diffusion signal abnormality involving the left basal ganglia, only seen on coronal DWI sequence (series 7, image 18). Given the patient history of right-sided weakness, findings suspicious for a tiny punctate ischemic infarct. No associated hemorrhage or mass effect. No other diffusion abnormality to suggest acute or subacute ischemia. Gray-white matter differentiation otherwise maintained. No other areas of remote cortical infarction. No other evidence for acute or chronic intracranial hemorrhage. No mass lesion, midline shift or mass effect. No hydrocephalus or extra-axial fluid collection. Pituitary gland suprasellar region within normal limits. Midline structures intact. Vascular: Major intracranial vascular flow voids are maintained. Skull and upper cervical spine: Craniocervical junction within normal limits. Bone marrow signal intensity normal. No scalp soft tissue abnormality. Sinuses/Orbits: Patient status post bilateral ocular lens replacement. Globes and orbital soft tissues within normal limits. Paranasal sinuses are largely clear. No significant mastoid effusion. Inner ear structures grossly normal. Other: None. IMPRESSION: 1. Subtle 5 mm focus of  diffusion signal abnormality involving the left basal ganglia, suspicious for a tiny ischemic infarct given the patient history of right-sided weakness. No associated hemorrhage or mass effect. 2. No other acute intracranial abnormality. 3. Mild chronic microvascular ischemic disease with a few scattered remote lacunar infarcts about the basal ganglia and thalami. Electronically Signed   By: Rise Mu M.D.   On: 10/20/2020 05:49   US Carotid Bilateral  Result Date: 10/21/2020 CLINICAL DATA:  87 year old female with a history of stroke EXAM: BILATERAL CAROTID DUPLEX  ULTRASOUND TECHNIQUE: Wallace Cullens scale imaging, color Doppler and duplex ultrasound were performed of bilateral carotid and vertebral arteries in the neck. COMPARISON:  None. FINDINGS: Criteria: Quantification of carotid stenosis is based on velocity parameters that correlate the residual internal carotid diameter with NASCET-based stenosis levels, using the diameter of the distal internal carotid lumen as the denominator for stenosis measurement. The following velocity measurements were obtained: RIGHT ICA:  Systolic 82 cm/sec, Diastolic 19 cm/sec CCA:  60 cm/sec SYSTOLIC ICA/CCA RATIO:  1.4 ECA:  225 cm/sec LEFT ICA:  Systolic 106 cm/sec, Diastolic 19 cm/sec CCA:  75 cm/sec SYSTOLIC ICA/CCA RATIO:  1.4 ECA:  192 cm/sec Right Brachial SBP: Not acquired Left Brachial SBP: Not acquired RIGHT CAROTID ARTERY: No significant calcifications of the right common carotid artery. Intermediate waveform maintained. Moderate heterogeneous and partially calcified plaque at the right carotid bifurcation. No significant lumen shadowing. Low resistance waveform of the right ICA. No significant tortuosity. RIGHT VERTEBRAL ARTERY: Antegrade flow with low resistance waveform. LEFT CAROTID ARTERY: No significant calcifications of the left common carotid artery. Intermediate waveform maintained. Moderate heterogeneous and partially calcified plaque at the left  carotid bifurcation. No significant lumen shadowing. Low resistance waveform of the left ICA. No significant tortuosity. LEFT VERTEBRAL ARTERY:  Antegrade flow with low resistance waveform. IMPRESSION: Color duplex indicates moderate heterogeneous and calcified plaque, with no hemodynamically significant stenosis by duplex criteria in the extracranial cerebrovascular circulation. Signed, Yvone Neu. Reyne Dumas, RPVI [facility] Electronically Signed   By: Gilmer Mor D.O.   On: 10/21/2020 16:03   ECHOCARDIOGRAM COMPLETE BUBBLE STUDY  Result Date: 10/20/2020    ECHOCARDIOGRAM REPORT   Patient Name:   Cassandra Schaefer Date of Exam: 10/20/2020 Medical Rec #:  [id number]       Height:       63.0 in Accession #:    [id number]      Weight:       129.4 lb Date of Birth:  Aug 31, 1928       BSA:          1.607 m Patient Age:    87 years        BP:           168/62 mmHg Patient Gender: F               HR:           83 bpm. Exam Location:  [facility] Procedure: 2D Echo, Cardiac Doppler, Color Doppler and Saline Contrast Bubble            Study Indications:     TIA 434.9 / G45.9  History:         Patient has no prior history of Echocardiogram examinations.                  Risk Factors:Hypertension and Diabetes.  Sonographer:     Cristela Blue RDCS (AE) Referring Phys:  [id number] Angie Fava Diagnosing Phys: Lorine Bears MD  Sonographer Comments: Suboptimal parasternal window. IMPRESSIONS  1. Left ventricular ejection fraction, by estimation, is 55 to 60%. The left ventricle has normal function. Left ventricular endocardial border not optimally defined to evaluate regional wall motion. There is mild left ventricular hypertrophy. Left ventricular diastolic parameters are indeterminate.  2. Right ventricular systolic function is normal. The right ventricular size is normal. There is mildly elevated pulmonary artery systolic pressure.  3. The mitral valve is normal in  structure. Mild mitral valve regurgitation.  No evidence of mitral stenosis. Moderate mitral annular calcification.  4. The aortic valve is normal in structure. Aortic valve regurgitation is not visualized. Mild to moderate aortic valve sclerosis/calcification is present, without any evidence of aortic stenosis.  5. The inferior vena cava is normal in size with greater than 50% respiratory variability, suggesting right atrial pressure of 3 mmHg.  6. Agitated saline contrast bubble study was negative, with no evidence of any interatrial shunt. FINDINGS  Left Ventricle: Left ventricular ejection fraction, by estimation, is 55 to 60%. The left ventricle has normal function. Left ventricular endocardial border not optimally defined to evaluate regional wall motion. The left ventricular internal cavity size was normal in size. There is mild left ventricular hypertrophy. Left ventricular diastolic parameters are indeterminate. Right Ventricle: The right ventricular size is normal. No increase in right ventricular wall thickness. Right ventricular systolic function is normal. There is mildly elevated pulmonary artery systolic pressure. The tricuspid regurgitant velocity is 3.18  m/s, and with an assumed right atrial pressure of 3 mmHg, the estimated right ventricular systolic pressure is 43.4 mmHg. Left Atrium: Left atrial size was normal in size. Right Atrium: Right atrial size was normal in size. Pericardium: There is no evidence of pericardial effusion. Mitral Valve: The mitral valve is normal in structure. Moderate mitral annular calcification. Mild mitral valve regurgitation. No evidence of mitral valve stenosis. Tricuspid Valve: The tricuspid valve is normal in structure. Tricuspid valve regurgitation is trivial. No evidence of tricuspid stenosis. Aortic Valve: The aortic valve is normal in structure. Aortic valve regurgitation is not visualized. Mild to moderate aortic valve sclerosis/calcification is present,  without any evidence of aortic stenosis. Aortic valve mean gradient measures 4.5 mmHg. Aortic valve peak gradient measures 9.4 mmHg. Aortic valve area, by VTI measures 2.66 cm. Pulmonic Valve: The pulmonic valve was normal in structure. Pulmonic valve regurgitation is not visualized. No evidence of pulmonic stenosis. Aorta: The aortic root is normal in size and structure. Venous: The inferior vena cava is normal in size with greater than 50% respiratory variability, suggesting right atrial pressure of 3 mmHg. IAS/Shunts: No atrial level shunt detected by color flow Doppler. Agitated saline contrast was given intravenously to evaluate for intracardiac shunting. Agitated saline contrast bubble study was negative, with no evidence of any interatrial shunt.  LEFT VENTRICLE PLAX 2D LVIDd:         2.75 cm  Diastology LVIDs:         2.02 cm  LV e' medial:    6.31 cm/s LV PW:         1.18 cm  LV E/e' medial:  18.5 LV IVS:        0.90 cm  LV e' lateral:   6.85 cm/s LVOT diam:     2.00 cm  LV E/e' lateral: 17.1 LV SV:         85 LV SV Index:   53 LVOT Area:     3.14 cm  RIGHT VENTRICLE RV Basal diam:  2.97 cm RV S prime:     16.90 cm/s TAPSE (M-mode): 3.2 cm LEFT ATRIUM           Index       RIGHT ATRIUM          Index LA diam:      2.60 cm 1.62 cm/m  RA Area:     7.99 cm LA Vol (A2C): 24.4 ml 15.18 ml/m RA Volume:   13.80 ml 8.59 ml/m LA Vol (A4C): 26.3 ml  16.37 ml/m  AORTIC VALVE                    PULMONIC VALVE AV Area (Vmax):    2.42 cm     PV Vmax:        0.37 m/s AV Area (Vmean):   2.12 cm     PV Peak grad:   0.5 mmHg AV Area (VTI):     2.66 cm     RVOT Peak grad: 1 mmHg AV Vmax:           153.00 cm/s AV Vmean:          101.250 cm/s AV VTI:            0.320 m AV Peak Grad:      9.4 mmHg AV Mean Grad:      4.5 mmHg LVOT Vmax:         118.00 cm/s LVOT Vmean:        68.400 cm/s LVOT VTI:          0.271 m LVOT/AV VTI ratio: 0.85  AORTA Ao Root diam: 2.77 cm MITRAL VALVE                TRICUSPID VALVE MV Area  (PHT): 3.60 cm     TR Peak grad:   40.4 mmHg MV Decel Time: 211 msec     TR Vmax:        318.00 cm/s MV E velocity: 117.00 cm/s MV A velocity: 138.00 cm/s  SHUNTS MV E/A ratio:  0.85         Systemic VTI:  0.27 m                             Systemic Diam: 2.00 cm Lorine Bears MD Electronically signed by Lorine Bears MD Signature Date/Time: 10/20/2020/1:44:50 PM    Final      Assessment and Plan:   Tachycardia, PACs -Telemetry monitor and ECGs reviewed by myself, no evidence of sustained tachycardia noted. -Alerts on telemetry associated with occasional PACs. -No A. fib, atrial flutter or any significant tachycardias noted. -Echocardiogram shows preserved EF. -Patient and family made aware and reassured.  2.  Preprocedure cardiovascular exam -Patient has no cardiac symptoms, echo preserved EF. -Okay for either stenting or CEA as deemed appropriate by vascular surgery team -No additional testing or intervention will mitigate patient's risk. -Continue management of risk factors including hypertension, hyperlipidemia, diabetes with oral medications.  3.  CVA/lacunar infarct -Etiology likely carotid stenosis. -No evidence of arrhythmia/A. fib or flutter noted on telemetry. -Monitor can be considered on discharge, although low yield in the presence of significant carotid stenosis. -agree with aspirin, Plavix, high intensity statin -PT OT  Patient with acute CVA, has no active cardiac issues.  Recommended management of risk factors as mentioned above.  No indication for additional cardiac testing at this time as patient has no cardiac symptoms.  Further management of stroke, dispo planning as per vascular surgery team and medicine team.  Please let us know if additional input is needed.  Cardiology will sign off at this time.  Thank you for this consult.  Total encounter time more than 110 minutes  Greater than 50% was spent in counseling and coordination of care with the patient and  family    Signed, Debbe Odea, MD  10/22/2020 3:33 PM

## 2020-10-22 NOTE — Progress Notes (Signed)
OT Cancellation Note  Patient Details Name: Cassandra Schaefer MRN: [id number] DOB: 11-May-1928   Cancelled Treatment:    Reason Eval/Treat Not Completed: Other (comment). Pt and daughter in room with physician present and consulting with family. OT to return at next available time.   Jackquline Denmark, MS, OTR/L , CBIS ascom [phone]  10/22/20, 3:09 PM

## 2020-10-22 NOTE — Progress Notes (Signed)
Inpatient Diabetes Program Recommendations  AACE/ADA: New Consensus Statement on Inpatient Glycemic Control   Target Ranges:  Prepandial:   less than 140 mg/dL      Peak postprandial:   less than 180 mg/dL (1-2 hours)      Critically ill patients:  140 - 180 mg/dL   Results for Cassandra Schaefer, Cassandra Schaefer (MRN [id number]) as of 10/22/2020 10:58  Ref. Range 10/21/2020 07:46 10/21/2020 20:30 10/22/2020 08:37  Glucose-Capillary Latest Ref Range: 70 - 99 mg/dL 712 (H) 458 (H) 099 (H)    Review of Glycemic Control  Diabetes history: DM2 Outpatient Diabetes medications: Metformin XR 500 QHS Current orders for Inpatient glycemic control: Novolog 0-5 units QHS  Inpatient Diabetes Program Recommendations:    Insulin: Please consider ordering Novolog 0-6 units TID with meals.  Thanks, Orlando Penner, RN, MSN, CDE Diabetes Coordinator Inpatient Diabetes Program [phone] (Team Pager from 8am to 5pm)

## 2020-10-22 NOTE — Progress Notes (Signed)
Subjective: No complaints. Enjoying the company of her daughter, who is in the room.   Objective: Current vital signs: BP (!) 158/57 (BP Location: Right Arm)   Pulse 71   Temp 98.1 F (36.7 C)   Resp 18   Ht 5\' 3"  (1.6 m)   Wt 55.2 kg   SpO2 97%   BMI 21.56 kg/m  Vital signs in last 24 hours: Temp:  [97.9 F (36.6 C)-98.9 F (37.2 C)] 98.1 F (36.7 C) (06/29 1644) Pulse Rate:  [66-84] 71 (06/29 1644) Resp:  [14-20] 18 (06/29 1644) BP: (104-170)/(45-60) 158/57 (06/29 1644) SpO2:  [92 %-98 %] 97 % (06/29 1644)  Intake/Output from previous day: 06/28 0701 - 06/29 0700 In: 240 [P.O.:240] Out: -  Intake/Output this shift: No intake/output data recorded. Nutritional status:  Diet Order             Diet Carb Modified Fluid consistency: Thin; Room service appropriate? Yes  Diet effective now                  HEENT: [place]/AT Lungs: Respirations unlabored Ext: No edema   Neurologic Exam: Ment: Awake and alert. Speech is mildly dysarthric. Occasional hesitation during speech, but otherwise fluent without errors of grammar or syntax. Intact comprehension. Follows all commands.  CN: Temporal visual fields intact with no extinction to DSS. EOMI. Decreased right NL fold, but grimace is symmetric. Motor: RUE 4/5 proximally and distally RLE 4/5 proximally and distally LUE and LLE 5/5 Cerebellar: Mild ataxia with FNF on the right. Normal on the left. RLE also exhibits some ataxia.   Lab Results: Results for orders placed or performed during the hospital encounter of 10/19/20 (from the past 48 hour(s))  Glucose, capillary     Status: Abnormal   Collection Time: 10/20/20  8:40 PM  Result Value Ref Range   Glucose-Capillary 120 (H) 70 - 99 mg/dL    Comment: Glucose reference range applies only to samples taken after fasting for at least 8 hours.  CBC     Status: Abnormal   Collection Time: 10/21/20  4:05 AM  Result Value Ref Range   WBC 12.3 (H) 4.0 - 10.5 K/uL   RBC 3.68  (L) 3.87 - 5.11 MIL/uL   Hemoglobin 11.6 (L) 12.0 - 15.0 g/dL   HCT 10/23/20 (L) 62.1 - 30.8 %   MCV 94.3 80.0 - 100.0 fL   MCH 31.5 26.0 - 34.0 pg   MCHC 33.4 30.0 - 36.0 g/dL   RDW 65.7 84.6 - 96.2 %   Platelets 220 150 - 400 K/uL   nRBC 0.0 0.0 - 0.2 %    Comment: Performed at [facility], [address], [place], [place]  Basic metabolic panel     Status: Abnormal   Collection Time: 10/21/20  4:05 AM  Result Value Ref Range   Sodium 138 135 - 145 mmol/L   Potassium 4.7 3.5 - 5.1 mmol/L   Chloride 109 98 - 111 mmol/L   CO2 23 22 - 32 mmol/L   Glucose, Bld 141 (H) 70 - 99 mg/dL    Comment: Glucose reference range applies only to samples taken after fasting for at least 8 hours.   BUN 27 (H) 8 - 23 mg/dL   Creatinine, Ser 10/23/20 (H) 0.44 - 1.00 mg/dL   Calcium 9.0 8.9 - 4.40 mg/dL   GFR, Estimated 33 (L) >60 mL/min    Comment: (NOTE) Calculated using the CKD-EPI Creatinine Equation (2021)  Anion gap 6 5 - 15    Comment: Performed at [facility], [address] [place], [place] [address]  Glucose, capillary     Status: Abnormal   Collection Time: 10/21/20  7:46 AM  Result Value Ref Range   Glucose-Capillary 128 (H) 70 - 99 mg/dL    Comment: Glucose reference range applies only to samples taken after fasting for at least 8 hours.  Glucose, capillary     Status: Abnormal   Collection Time: 10/21/20  8:30 PM  Result Value Ref Range   Glucose-Capillary 283 (H) 70 - 99 mg/dL    Comment: Glucose reference range applies only to samples taken after fasting for at least 8 hours.  Basic metabolic panel     Status: Abnormal   Collection Time: 10/22/20  4:27 AM  Result Value Ref Range   Sodium 139 135 - 145 mmol/L   Potassium 4.3 3.5 - 5.1 mmol/L   Chloride 108 98 - 111 mmol/L   CO2 22 22 - 32 mmol/L   Glucose, Bld 125 (H) 70 - 99 mg/dL    Comment: Glucose reference range applies only to samples taken after fasting for at least 8 hours.   BUN 31 (H) 8 -  23 mg/dL   Creatinine, Ser 1.91 (H) 0.44 - 1.00 mg/dL   Calcium 9.1 8.9 - 47.8 mg/dL   GFR, Estimated 31 (L) >60 mL/min    Comment: (NOTE) Calculated using the CKD-EPI Creatinine Equation (2021)    Anion gap 9 5 - 15    Comment: Performed at [facility], [address] [place], [place] [address]  Glucose, capillary     Status: Abnormal   Collection Time: 10/22/20  8:37 AM  Result Value Ref Range   Glucose-Capillary 144 (H) 70 - 99 mg/dL    Comment: Glucose reference range applies only to samples taken after fasting for at least 8 hours.    Recent Results (from the past 240 hour(s))  Resp Panel by RT-PCR (Flu A&B, Covid) Nasopharyngeal Swab     Status: None   Collection Time: 10/19/20 10:26 PM   Specimen: Nasopharyngeal Swab; Nasopharyngeal(NP) swabs in vial transport medium  Result Value Ref Range Status   SARS Coronavirus 2 by RT PCR NEGATIVE NEGATIVE Final    Comment: (NOTE) SARS-CoV-2 target nucleic acids are NOT DETECTED.  The SARS-CoV-2 RNA is generally detectable in upper respiratory specimens during the acute phase of infection. The lowest concentration of SARS-CoV-2 viral copies this assay can detect is 138 copies/mL. A negative result does not preclude SARS-Cov-2 infection and should not be used as the sole basis for treatment or other patient management decisions. A negative result may occur with  improper specimen collection/handling, submission of specimen other than nasopharyngeal swab, presence of viral mutation(s) within the areas targeted by this assay, and inadequate number of viral copies(<138 copies/mL). A negative result must be combined with clinical observations, patient history, and epidemiological information. The expected result is Negative.  Fact Sheet for Patients:  BloggerCourse.com  Fact Sheet for Healthcare Providers:  SeriousBroker.it  This test is no t yet approved or cleared by  the [place] FDA and  has been authorized for detection and/or diagnosis of SARS-CoV-2 by FDA under an Emergency Use Authorization (EUA). This EUA will remain  in effect (meaning this test can be used) for the duration of the COVID-19 declaration under Section 564(b)(1) of the Act, 21 U.S.C.section 360bbb-3(b)(1), unless the authorization is terminated  or revoked sooner.  Influenza A by PCR NEGATIVE NEGATIVE Final   Influenza B by PCR NEGATIVE NEGATIVE Final    Comment: (NOTE) The Xpert Xpress SARS-CoV-2/FLU/RSV plus assay is intended as an aid in the diagnosis of influenza from Nasopharyngeal swab specimens and should not be used as a sole basis for treatment. Nasal washings and aspirates are unacceptable for Xpert Xpress SARS-CoV-2/FLU/RSV testing.  Fact Sheet for Patients: BloggerCourse.com  Fact Sheet for Healthcare Providers: SeriousBroker.it  This test is not yet approved or cleared by the [place] FDA and has been authorized for detection and/or diagnosis of SARS-CoV-2 by FDA under an Emergency Use Authorization (EUA). This EUA will remain in effect (meaning this test can be used) for the duration of the COVID-19 declaration under Section 564(b)(1) of the Act, 21 U.S.C. section 360bbb-3(b)(1), unless the authorization is terminated or revoked.  Performed at [facility], [address] [place], [place] [address]   Urine Culture     Status: Abnormal   Collection Time: 10/19/20 10:26 PM   Specimen: Urine, Random  Result Value Ref Range Status   Specimen Description   Final    URINE, RANDOM Performed at [facility], [address] [place], [place] [address]    Special Requests   Final    NONE Performed at [facility], [address] [place], [place] [address]    Culture MULTIPLE SPECIES PRESENT, SUGGEST RECOLLECTION (A)  Final   Report Status 10/21/2020 FINAL  Final     Lipid Panel Recent Labs    10/19/20 2101  CHOL 128  TRIG 211*  HDL 43  CHOLHDL 3.0  VLDL 42*  LDLCALC 43    Studies/Results: MR BRAIN WO CONTRAST  Result Date: 10/21/2020 CLINICAL DATA:  Stroke follow-up. EXAM: MRI HEAD WITHOUT CONTRAST TECHNIQUE: Multiplanar, multiecho pulse sequences of the brain and surrounding structures were obtained without intravenous contrast. COMPARISON:  MRI of the brain October 20, 2020. FINDINGS: Brain: Interval progression of area of restricted diffusion involving the left ventrolateral thalamus, measuring approximately 15 mm, consistent with acute infarct. No hemorrhage, hydrocephalus, extra-axial collection or mass lesion. Remote lacunar infarct in the right thalamus. Small amount of scattered foci of T2 hyperintensity are seen within the white matter of the cerebral hemispheres and within the pons, nonspecific, most likely related to chronic small vessel ischemia. Mild parenchymal volume loss. Vascular: Absent flow void in the V3 and V4 segments of the right vertebral artery may be related to slow flow versus occlusion. Skull and upper cervical spine: Normal marrow signal. Sinuses/Orbits: Bilateral lens surgery. Paranasal sinuses are clear. Other: None. IMPRESSION: Significant interval progression of the area of restricted diffusion within the left ventrolateral thalamus consistent with acute infarct, now measuring 15 mm. Electronically Signed   By: Baldemar Lenis M.D.   On: 10/21/2020 15:32   US Carotid Bilateral  Result Date: 10/21/2020 CLINICAL DATA:  87 year old female with a history of stroke EXAM: BILATERAL CAROTID DUPLEX ULTRASOUND TECHNIQUE: Wallace Cullens scale imaging, color Doppler and duplex ultrasound were performed of bilateral carotid and vertebral arteries in the neck. COMPARISON:  None. FINDINGS: Criteria: Quantification of carotid stenosis is based on velocity parameters that correlate the residual internal carotid diameter with  NASCET-based stenosis levels, using the diameter of the distal internal carotid lumen as the denominator for stenosis measurement. The following velocity measurements were obtained: RIGHT ICA:  Systolic 82 cm/sec, Diastolic 19 cm/sec CCA:  60 cm/sec SYSTOLIC ICA/CCA RATIO:  1.4 ECA:  225 cm/sec LEFT ICA:  Systolic  106 cm/sec, Diastolic 19 cm/sec CCA:  75 cm/sec SYSTOLIC ICA/CCA RATIO:  1.4 ECA:  192 cm/sec Right Brachial SBP: Not acquired Left Brachial SBP: Not acquired RIGHT CAROTID ARTERY: No significant calcifications of the right common carotid artery. Intermediate waveform maintained. Moderate heterogeneous and partially calcified plaque at the right carotid bifurcation. No significant lumen shadowing. Low resistance waveform of the right ICA. No significant tortuosity. RIGHT VERTEBRAL ARTERY: Antegrade flow with low resistance waveform. LEFT CAROTID ARTERY: No significant calcifications of the left common carotid artery. Intermediate waveform maintained. Moderate heterogeneous and partially calcified plaque at the left carotid bifurcation. No significant lumen shadowing. Low resistance waveform of the left ICA. No significant tortuosity. LEFT VERTEBRAL ARTERY:  Antegrade flow with low resistance waveform. IMPRESSION: Color duplex indicates moderate heterogeneous and calcified plaque, with no hemodynamically significant stenosis by duplex criteria in the extracranial cerebrovascular circulation. Signed, Yvone Neu. Reyne Dumas, RPVI [facility] Electronically Signed   By: Gilmer Mor D.O.   On: 10/21/2020 16:03    Medications: Scheduled:  aspirin  81 mg Oral Daily   atorvastatin  80 mg Oral Daily   clopidogrel  75 mg Oral Daily   insulin aspart  0-5 Units Subcutaneous QHS   levothyroxine  88 mcg Oral Q0600     Assessment: 87 y.o. female with a PMHx of anemia, DM, HTN, hypothyroidism, gout, CKD stage III and PUD who presented to the [facility] ED on  Sunday evening with chief complaints of right sided weakness, right arm numbness, difficulty ambulating and word finding difficulty. No associated dizziness or headache. Her symptoms began at 1230 on Sunday. She has no prior history of stroke. She was outside of the tPA window at the time of presentation. Initial MRI scan showed a punctate focus of restricted diffusion in the left basal ganglia. Follow up MRI obtained after clinical worsening revealed extension of the infarct within the left thalamus.  1. Exam reveals mild to moderate dysarthria, subtle right facial weakness, RUE/RLE ataxia and RUE/RLE mild weakness.  2. CTA of head and neck showed no LVO. Atherosclerotic changes were noted in the intracranial circulation as well as the neck, including bulky calcified plaque about the left carotid bulb with associated stenosis of up to 70% by NASCET criteria. Atheromatous plaque at the origin of the dominant left vertebral artery with associated severe stenosis. Strongly dominant left vertebral artery otherwise patent within the neck. Atheromatous change throughout the right PCA with associated moderate multifocal P2/P3 stenoses. 3. Repeat MRI reveals significant interval progression of the area of restricted diffusion within the left ventrolateral thalamus consistent with acute infarct, now measuring 15 mm. Plavix was added to ASA after extension of stroke was revealed on MRI.  4. Carotid ultrasound revealed moderate heterogeneous and calcified plaque, with no hemodynamically significant stenosis by duplex criteria in the extracranial cerebrovascular circulation, per the report. However, Vascular Surgery notes that the heavily calcified plaque seen on CTA would likely be underestimated by ultrasound and the patient may still be a candidate for CEA.   5. TTE reveals normal left venticular function with EF of 55 to 60%. No evidence of any interatrial shunt.  6. HgbA1c elevated at 7. Elevated triglycerides and  VLDL on cholesterol panel.    Recommendations: 1. Continue ASA and Plavix dual antiplatelet therapy, in addition to atorvastatin. She is now on maximal medical therapy for secondary stroke prevention.  2. Given the symptomatic left ICA stenosis, Vascular Surgery is considering stenting versus CEA.  3.  PT/OT/Speech 4. Telemetry monitoring. If no evidence for atrial fibrillation/flutter, consider 30-day cardiac event monitor at time of discharge for further assessment.  5. Frequent neuro checks 6. BP management as per standard protocol.  7. From a Neurological standpoint, she can be discharged to inpatient rehab followed by Vascular Surgery follow up in 1-2 weeks.  8. Neurohospitalist service will sign off at this time. Please call if there are additional questions.      LOS: 2 days   @Electronically  signed: Dr. Caryl PinaEric Nahome Bublitz@ 10/22/2020  7:09 PM

## 2020-10-22 NOTE — Progress Notes (Signed)
PROGRESS NOTE    Cassandra Schaefer   NFA:[id number]  DOB: Nov 07, 1928  PCP: Barbette Reichmann, MD    DOA: 10/19/2020 LOS: 2   Assessment & Plan   Principal Problem:   TIA (transient ischemic attack) Active Problems:   Hyperlipidemia, unspecified   Hypertension   Hypothyroidism, acquired   Type 2 diabetes mellitus (HCC)   Right hemiparesis (HCC)   Ischemic stroke (HCC)   PAC (premature atrial contraction)   Pre-procedural cardiovascular examination   Ischemic CVA of the left basal ganglia Carotid/vertebral artery stenosis No hemorrhagic transformation.  Symptoms are mild -right-sided weakness.  Received asa 325 at home.  CTA with carotid bulb plaque and vertebral artery plaque w/ severe stenosis.  Passed swallow eval.  TTE unremarkable.  Occasional tachycardia on tele but no a fib.  PT advising [name] for rehab upon discharge -pending --Neurology following --Continue aspirin and statin --[name] evaluating, insurance Auth pending --Vascular surgery consulted --Ziopatch at discharge if discharged home and no abnormal rhythm encountered here -- Monitoring telemetry for now --Cardiology consulted for intermittent significant tachycardia at family's request   Noninsulin-dependent type 2 diabetes - On metformin at home, on hold.  Monitor daily fasting sugar for now.  -- Start sensitive sliding scale NovoLog given glucose 283 last night; otherwise sugars have been at inpatient goal   Hypothyroidism -continue levothyroxine   Essential hypertension -initially allowed for permissive HTN in setting of acute stroke; neurology ok with resuming home antihypertensives (Monopril 10 mg), but given stable BPs with meds held, continue to hold for now.  Monitor BP.  Resume antihypertensives when indicated.  Patient BMI: Body mass index is 21.56 kg/m.   DVT prophylaxis: Place and maintain sequential compression device Start: 10/22/20 1418 SCDs Start: 10/19/20 2317   Diet:  Diet Orders  (From admission, onward)     Start     Ordered   10/19/20 2315  Diet Carb Modified Fluid consistency: Thin; Room service appropriate? Yes  Diet effective now       Question Answer Comment  Diet-HS Snack? Nothing   Calorie Level Medium 1600-2000   Fluid consistency: Thin   Room service appropriate? Yes      10/19/20 2314              Code Status: Full Code   Brief Narrative / Hospital Course to Date:   87 yo female with past history of hypertension, type 2 diabetes, CKD stage III who presented with right sided weakness, gait dysfunction, and speech dysfunction beginning 12:30 on 6/26. MRI showed left basal ganglia small ischemic stroke, no hemorrhage.  Admitted to [facility] service with neurology consulted for further evaluation.  Patient noted to have worsening neurologic symptoms on 6/28 and had repeat MRI which showed stroke enlarged from 5 mm to 15 mm.    Physical and occupational therapy recommending [name] for rehab.  Patient previously fully independent with ambulation and ADLs.    Subjective 10/22/20    Patient seen with 2 daughters at bedside this morning.  Reports she continues to be weak on the right side.  Denies numbness or tingling but states her right upper and lower extremities feel heavy.  States she can feel when her heart is racing and feels slightly short of breath but denies having any chest pain with those episodes.  No other acute complaints.   Disposition Plan & Communication   Status is: Inpatient  Remains inpatient appropriate because:Ongoing diagnostic testing needed not appropriate for outpatient work up and requires [name] rehab  placement which is underway  Dispo: The patient is from: Home              Anticipated d/c is to: [name]              Patient currently is not medically stable to d/c.   Difficult to place patient No  Family Communication: 2 daughters at bedside on rounds   Consults, Procedures, Significant Events   Consultants:   Neurology Vascular surgery Cardiology  Procedures:  None  Antimicrobials:  Anti-infectives (From admission, onward)    None         Micro    Objective   Vitals:   10/22/20 0158 10/22/20 0355 10/22/20 0816 10/22/20 1644  BP: (!) 104/53 (!) 140/49 (!) 143/60 (!) 158/57  Pulse: 78 67 72 71  Resp: Temp:  98.5 F (36.9 C) 98.6 F (37 C) 98.1 F (36.7 C)  TempSrc:  Oral    SpO2: 93% 94% 98% 97%  Weight:      Height:        Intake/Output Summary (Last 24 hours) at 10/22/2020 1726 Last data filed at 10/22/2020 1419 Gross per 24 hour  Intake 0 ml  Output --  Net 0 ml   Filed Weights   10/20/20 0045 10/20/20 0151 10/21/20 0450  Weight: 58.7 kg 58.7 kg 55.2 kg    Physical Exam:  General exam: awake, alert, no acute distress HEENT: atraumatic, clear conjunctiva, anicteric sclera, moist mucus membranes, hearing grossly normal  Respiratory system: CTAB, no wheezes, rales or rhonchi, normal respiratory effort. Cardiovascular system: normal S1/S2, RRR, no JVD, murmurs, rubs, gallops, no pedal edema.   Gastrointestinal system: soft, NT, ND, no HSM felt, +bowel sounds. Central nervous system: A&O x3.  Mild weakness of the right upper and lower extremities versus left, approximately 4 out of 5 motor, normal speech Extremities: moves all, no edema, normal tone Skin: Dark tan, dry, intact, normal temperature Psychiatry: normal mood, congruent affect, judgement and insight appear normal  Labs   Data Reviewed: I have personally reviewed following labs and imaging studies  CBC: Recent Labs  Lab 10/19/20 2101 10/20/20 0423 10/21/20 0405  WBC 8.1 6.7 12.3*  NEUTROABS 3.9  --   --   HGB 11.9* 10.7* 11.6*  HCT 35.7* 32.9* 34.7*  MCV 94.9 94.8 94.3  PLT 242 206 220   Basic Metabolic Panel: Recent Labs  Lab 10/19/20 2101 10/20/20 0423 10/21/20 0405 10/22/20 0427  NA 135 140 138 139  K 4.3 4.2 4.7 4.3  CL 107 111 109 108  CO2 20* GLUCOSE  245* 144* 141* 125*  BUN 28* 26* 27* 31*  CREATININE 1.40* 1.34* 1.51* 1.57*  CALCIUM 8.9 9.0 9.0 9.1  MG 1.9 1.8  --   --    GFR: Estimated Creatinine Clearance: 19.7 mL/min (A) (by C-G formula based on SCr of 1.57 mg/dL (H)). Liver Function Tests: Recent Labs  Lab 10/19/20 2101 10/20/20 0423  AST 18 16  ALT 12 11  ALKPHOS 103 83  BILITOT 0.7 0.5  PROT 7.3 6.5  ALBUMIN 4.1 3.5   No results for input(s): LIPASE, AMYLASE in the last 168 hours. No results for input(s): AMMONIA in the last 168 hours. Coagulation Profile: Recent Labs  Lab 10/19/20 2101  INR 1.1   Cardiac Enzymes: No results for input(s): CKTOTAL, CKMB, CKMBINDEX, TROPONINI in the last 168 hours. BNP (last 3 results) No results for input(s): PROBNP in the last  8760 hours. HbA1C: Recent Labs    10/19/20 2101  HGBA1C 7.0*   CBG: Recent Labs  Lab 10/20/20 0809 10/20/20 2040 10/21/20 0746 10/21/20 2030 10/22/20 0837  GLUCAP 128* 120* 128* 283* 144*   Lipid Profile: Recent Labs    10/19/20 2101  CHOL 128  HDL 43  LDLCALC 43  TRIG 211*  CHOLHDL 3.0   Thyroid Function Tests: No results for input(s): TSH, T4TOTAL, FREET4, T3FREE, THYROIDAB in the last 72 hours. Anemia Panel: No results for input(s): VITAMINB12, FOLATE, FERRITIN, TIBC, IRON, RETICCTPCT in the last 72 hours. Sepsis Labs: No results for input(s): PROCALCITON, LATICACIDVEN in the last 168 hours.  Recent Results (from the past 240 hour(s))  Resp Panel by RT-PCR (Flu A&B, Covid) Nasopharyngeal Swab     Status: None   Collection Time: 10/19/20 10:26 PM   Specimen: Nasopharyngeal Swab; Nasopharyngeal(NP) swabs in vial transport medium  Result Value Ref Range Status   SARS Coronavirus 2 by RT PCR NEGATIVE NEGATIVE Final    Comment: (NOTE) SARS-CoV-2 target nucleic acids are NOT DETECTED.  The SARS-CoV-2 RNA is generally detectable in upper respiratory specimens during the acute phase of infection. The lowest concentration of  SARS-CoV-2 viral copies this assay can detect is 138 copies/mL. A negative result does not preclude SARS-Cov-2 infection and should not be used as the sole basis for treatment or other patient management decisions. A negative result may occur with  improper specimen collection/handling, submission of specimen other than nasopharyngeal swab, presence of viral mutation(s) within the areas targeted by this assay, and inadequate number of viral copies(<138 copies/mL). A negative result must be combined with clinical observations, patient history, and epidemiological information. The expected result is Negative.  Fact Sheet for Patients:  BloggerCourse.com  Fact Sheet for Healthcare Providers:  SeriousBroker.it  This test is no t yet approved or cleared by the [place] FDA and  has been authorized for detection and/or diagnosis of SARS-CoV-2 by FDA under an Emergency Use Authorization (EUA). This EUA will remain  in effect (meaning this test can be used) for the duration of the COVID-19 declaration under Section 564(b)(1) of the Act, 21 U.S.C.section 360bbb-3(b)(1), unless the authorization is terminated  or revoked sooner.       Influenza A by PCR NEGATIVE NEGATIVE Final   Influenza B by PCR NEGATIVE NEGATIVE Final    Comment: (NOTE) The Xpert Xpress SARS-CoV-2/FLU/RSV plus assay is intended as an aid in the diagnosis of influenza from Nasopharyngeal swab specimens and should not be used as a sole basis for treatment. Nasal washings and aspirates are unacceptable for Xpert Xpress SARS-CoV-2/FLU/RSV testing.  Fact Sheet for Patients: BloggerCourse.com  Fact Sheet for Healthcare Providers: SeriousBroker.it  This test is not yet approved or cleared by the [place] FDA and has been authorized for detection and/or diagnosis of SARS-CoV-2 by FDA under an Emergency Use  Authorization (EUA). This EUA will remain in effect (meaning this test can be used) for the duration of the COVID-19 declaration under Section 564(b)(1) of the Act, 21 U.S.C. section 360bbb-3(b)(1), unless the authorization is terminated or revoked.  Performed at [facility], [address], [place], [place] [address]   Urine Culture     Status: Abnormal   Collection Time: 10/19/20 10:26 PM   Specimen: Urine, Random  Result Value Ref Range Status   Specimen Description   Final    URINE, RANDOM Performed at [facility], [address], [place], [place] [address]    Special  Requests   Final    NONE Performed at [facility], [address] [place], [place] [address]    Culture MULTIPLE SPECIES PRESENT, SUGGEST RECOLLECTION (A)  Final   Report Status 10/21/2020 FINAL  Final      Imaging Studies   MR BRAIN WO CONTRAST  Result Date: 10/21/2020 CLINICAL DATA:  Stroke follow-up. EXAM: MRI HEAD WITHOUT CONTRAST TECHNIQUE: Multiplanar, multiecho pulse sequences of the brain and surrounding structures were obtained without intravenous contrast. COMPARISON:  MRI of the brain October 20, 2020. FINDINGS: Brain: Interval progression of area of restricted diffusion involving the left ventrolateral thalamus, measuring approximately 15 mm, consistent with acute infarct. No hemorrhage, hydrocephalus, extra-axial collection or mass lesion. Remote lacunar infarct in the right thalamus. Small amount of scattered foci of T2 hyperintensity are seen within the white matter of the cerebral hemispheres and within the pons, nonspecific, most likely related to chronic small vessel ischemia. Mild parenchymal volume loss. Vascular: Absent flow void in the V3 and V4 segments of the right vertebral artery may be related to slow flow versus occlusion. Skull and upper cervical spine: Normal marrow signal. Sinuses/Orbits: Bilateral lens surgery. Paranasal sinuses are clear. Other: None.  IMPRESSION: Significant interval progression of the area of restricted diffusion within the left ventrolateral thalamus consistent with acute infarct, now measuring 15 mm. Electronically Signed   By: Baldemar Lenis M.D.   On: 10/21/2020 15:32   US Carotid Bilateral  Result Date: 10/21/2020 CLINICAL DATA:  87 year old female with a history of stroke EXAM: BILATERAL CAROTID DUPLEX ULTRASOUND TECHNIQUE: Wallace Cullens scale imaging, color Doppler and duplex ultrasound were performed of bilateral carotid and vertebral arteries in the neck. COMPARISON:  None. FINDINGS: Criteria: Quantification of carotid stenosis is based on velocity parameters that correlate the residual internal carotid diameter with NASCET-based stenosis levels, using the diameter of the distal internal carotid lumen as the denominator for stenosis measurement. The following velocity measurements were obtained: RIGHT ICA:  Systolic 82 cm/sec, Diastolic 19 cm/sec CCA:  60 cm/sec SYSTOLIC ICA/CCA RATIO:  1.4 ECA:  225 cm/sec LEFT ICA:  Systolic 106 cm/sec, Diastolic 19 cm/sec CCA:  75 cm/sec SYSTOLIC ICA/CCA RATIO:  1.4 ECA:  192 cm/sec Right Brachial SBP: Not acquired Left Brachial SBP: Not acquired RIGHT CAROTID ARTERY: No significant calcifications of the right common carotid artery. Intermediate waveform maintained. Moderate heterogeneous and partially calcified plaque at the right carotid bifurcation. No significant lumen shadowing. Low resistance waveform of the right ICA. No significant tortuosity. RIGHT VERTEBRAL ARTERY: Antegrade flow with low resistance waveform. LEFT CAROTID ARTERY: No significant calcifications of the left common carotid artery. Intermediate waveform maintained. Moderate heterogeneous and partially calcified plaque at the left carotid bifurcation. No significant lumen shadowing. Low resistance waveform of the left ICA. No significant tortuosity. LEFT VERTEBRAL ARTERY:  Antegrade flow with low resistance waveform.  IMPRESSION: Color duplex indicates moderate heterogeneous and calcified plaque, with no hemodynamically significant stenosis by duplex criteria in the extracranial cerebrovascular circulation. Signed, Yvone Neu. Reyne Dumas, RPVI [facility] Electronically Signed   By: Gilmer Mor D.O.   On: 10/21/2020 16:03     Medications   Scheduled Meds:  aspirin  81 mg Oral Daily   atorvastatin  80 mg Oral Daily   clopidogrel  75 mg Oral Daily   insulin aspart  0-5 Units Subcutaneous QHS   levothyroxine  88 mcg Oral Q0600   Continuous Infusions:     LOS: 2 days  Time spent: 30 minutes    Pennie BanterKelly A Nakota Ackert, DO [facility]  10/22/2020, 5:26 PM      If 7PM-7AM, please contact night-coverage. How to contact the [facility] Attending or Consulting provider 7A - 7P or covering provider during after hours 7P -7A, for this patient?    Check the care team in [facility] and look for a) attending/consulting [facility] provider listed and b) the [facility] team listed Log into [email or web address] and use [facility]'s universal password to access. If you do not have the password, please contact the hospital operator. Locate the [facility] provider you are looking for under [facility] and page to a number that you can be directly reached. If you still have difficulty reaching the provider, please page the [facility] (Director on Call) for the Hospitalists listed on amion for assistance.

## 2020-10-22 NOTE — Progress Notes (Signed)
[facility]  Daily Progress Note   Subjective  -   Continues to have aphasia but it is slightly improved.  Some weakness and discoordination of the right arm and foot persist.  No major changes overnight.  Objective Vitals:   10/22/20 0045 10/22/20 0158 10/22/20 0355 10/22/20 0816  BP:  (!) 104/53 (!) 140/49 (!) 143/60  Pulse: 66 78 67 72  Resp: 16 14 17 17   Temp:   98.5 F (36.9 C) 98.6 F (37 C)  TempSrc:   Oral   SpO2: 94% 93% 94% 98%  Weight:      Height:        Intake/Output Summary (Last 24 hours) at 10/22/2020 1524 Last data filed at 10/22/2020 1419 Gross per 24 hour  Intake 0 ml  Output --  Net 0 ml    PULM  CTAB CV  RRR VASC  right-sided discoordination and weakness.  Aphasia is present but improved from yesterday.  Laboratory CBC    Component Value Date/Time   WBC 12.3 (H) 10/21/2020 0405   HGB 11.6 (L) 10/21/2020 0405   HCT 34.7 (L) 10/21/2020 0405   PLT 220 10/21/2020 0405    BMET    Component Value Date/Time   NA 139 10/22/2020 0427   K 4.3 10/22/2020 0427   CL 108 10/22/2020 0427   CO2 22 10/22/2020 0427   GLUCOSE 125 (H) 10/22/2020 0427   BUN 31 (H) 10/22/2020 0427   CREATININE 1.57 (H) 10/22/2020 0427   CALCIUM 9.1 10/22/2020 0427   GFRNONAA 31 (L) 10/22/2020 0427    Assessment/Planning:   Left hemispheric stroke with 70% or greater carotid stenosis Symptoms seem to have plateaued today from yesterday. Would agree with physical therapy and Occupational Therapy at this point. Would do aspirin and Plavix as dual antiplatelet therapy as well as a statin agent. Discussed with daughter that intervention/surgery could be done but would have to wait at least a week.  Probably helpful to get cardiology to see the patient with her arrhythmias to assess her cardiac risk status for surgery versus stenting. Will follow   10/24/2020  10/22/2020, 3:24 PM

## 2020-10-22 NOTE — Hospital Course (Signed)
87 yo female with past history of hypertension, type 2 diabetes, CKD stage III who presented with right sided weakness, gait dysfunction, and speech dysfunction beginning 12:30 on 6/26. MRI showed left basal ganglia small ischemic stroke, no hemorrhage.  Admitted to [facility] service with neurology consulted for further evaluation.  Patient noted to have worsening neurologic symptoms on 6/28 and had repeat MRI which showed stroke enlarged from 5 mm to 15 mm.    Physical and occupational therapy recommending [name] for rehab.  Patient previously fully independent with ambulation and ADLs.

## 2020-10-22 NOTE — Progress Notes (Signed)
  Speech Language Pathology Treatment: Cognitive-Linquistic  Patient Details Name: Cassandra Schaefer MRN: [id number] DOB: 11/05/1928 Today's Date: 10/22/2020 Time: [id number] SLP Time Calculation (min) (ACUTE ONLY): 34 min  Assessment / Plan / Recommendation Clinical Impression  Skilled treatment session focused on pt's word finding and speech intelligibility goals. SLP facilitated session by providing vocabulary activities targeting opposites and synonyms. Pt was independent with generating opposites. She required moderate assistance to generate synonym but improved to minimal assistance when selecting possible synonym from 3 printed choices. When reading the various lists of words, pt's speech intelligibility was ~ 50% at the word level, which improved to 80% with maximal cues for increased vocal intensity and over-articulation.   Pt's daughter present and was positioned across the room to increase pt's functional speech intelligibility. Education provided on ways to cue pt when completing several pages left for practice. Pt's daughter able to return demonstrate knowledge. All questions answered to pt and her daughter's satisfaction.    HPI HPI: 87 y.o. female with medical history significant for type 2 diabetes mellitus, essential hypertension, acquired hypothyroidism, hyperlipidemia, stage III chronic kidney disease with baseline creatinine 1.4-1.7, who is admitted to [facility] on 10/19/2020 with suspected TIA versus acute ischemic CVA after presenting from home to [facility] ED complaining of right-sided weakness. Confirmed L basal ganglia ischemic stroke. 10/21/2020 MRI confirms extension in CVA from 46mm to 51mm      SLP Plan  Continue with current plan of care       Recommendations                   Follow up Recommendations: Inpatient Rehab SLP Visit Diagnosis: Aphasia (R47.01);Dysarthria and anarthria (R47.1) Plan: Continue with current plan of care        GO               Saahil Herbster B. Dreama Saa M.S., CCC-SLP, [facility] Speech-Language Pathologist Rehabilitation Services Office [phone]  [name] 10/22/2020, 12:09 PM

## 2020-10-22 NOTE — Progress Notes (Signed)
Inpatient Rehabilitation Admissions Coordinator   I await insurance approval for a possible [name] admit. They typically take 48 to 72 hrs.  Ottie Glazier, RN, MSN Rehab Admissions Coordinator [phone] 10/22/2020 11:56 AM

## 2020-10-23 LAB — GLUCOSE, CAPILLARY
Glucose-Capillary: 131 mg/dL — ABNORMAL HIGH (ref 70–99)
Glucose-Capillary: 166 mg/dL — ABNORMAL HIGH (ref 70–99)
Glucose-Capillary: 354 mg/dL — ABNORMAL HIGH (ref 70–99)
Glucose-Capillary: 333 mg/dL — ABNORMAL HIGH (ref 70–99)
Glucose-Capillary: 189 mg/dL — ABNORMAL HIGH (ref 70–99)

## 2020-10-23 LAB — BASIC METABOLIC PANEL
Anion gap: 7 (ref 5–15)
BUN: 30 mg/dL — ABNORMAL HIGH (ref 8–23)
CO2: 25 mmol/L (ref 22–32)
Calcium: 9.2 mg/dL (ref 8.9–10.3)
Chloride: 108 mmol/L (ref 98–111)
Creatinine, Ser: 1.51 mg/dL — ABNORMAL HIGH (ref 0.44–1.00)
GFR, Estimated: 33 mL/min — ABNORMAL LOW (ref >60)
Glucose, Bld: 142 mg/dL — ABNORMAL HIGH (ref 70–99)
Potassium: 4.3 mmol/L (ref 3.5–5.1)
Sodium: 140 mmol/L (ref 135–145)

## 2020-10-23 LAB — CBC
HCT: 34.4 % — ABNORMAL LOW (ref 36.0–46.0)
Hemoglobin: 11.5 g/dL — ABNORMAL LOW (ref 12.0–15.0)
MCH: 31.4 pg (ref 26.0–34.0)
MCHC: 33.4 g/dL (ref 30.0–36.0)
MCV: 94 fL (ref 80.0–100.0)
Platelets: 213 10*3/uL (ref 150–400)
RBC: 3.66 MIL/uL — ABNORMAL LOW (ref 3.87–5.11)
RDW: 12 % (ref 11.5–15.5)
WBC: 13.3 10*3/uL — ABNORMAL HIGH (ref 4.0–10.5)
nRBC: 0 % (ref 0.0–0.2)

## 2020-10-23 MED ORDER — INSULIN ASPART 100 UNIT/ML IJ SOLN: 0.0000 [IU] | Freq: Three times a day (TID) | INTRAMUSCULAR | Status: DC

## 2020-10-23 NOTE — Progress Notes (Addendum)
PROGRESS NOTE    Cassandra Schaefer   RXV:[id number]  DOB: 12/07/1928  PCP: Barbette Reichmann, MD    DOA: 10/19/2020 LOS: 3   Assessment & Plan   Principal Problem:   TIA (transient ischemic attack) Active Problems:   Hyperlipidemia, unspecified   Hypertension   Hypothyroidism, acquired   Type 2 diabetes mellitus (HCC)   Right hemiparesis (HCC)   Ischemic stroke (HCC)   PAC (premature atrial contraction)   Pre-procedural cardiovascular examination   Ischemic CVA of the left basal ganglia Carotid/vertebral artery stenosis No hemorrhagic transformation.  Symptoms are mild -right-sided weakness.  Received asa 325 at home.  CTA with carotid bulb plaque and vertebral artery plaque w/ severe stenosis.  Passed swallow eval.  TTE unremarkable.  Occasional tachycardia on tele but no a fib.  --Neurology following --Continue aspirin and statin --Vascular surgery consulted --Ziopatch at discharge, or in follow up -- telemetry  --d/c to [name] for rehab- insurance Auth pending  Sinus tachycardia with PAC's - noted 6/29.   Appears resolved. --Cardiology consulted at family's request Telemetry reviewed and no A-fib/flutter seen. Echo with preserved EF.   Noninsulin-dependent type 2 diabetes - On metformin at home, on hold.  Monitor daily fasting sugar for now.  -- Started on sensitive sliding scale NovoLog given glucose 283; otherwise sugars have been at inpatient goal   Hypothyroidism -continue levothyroxine   Essential hypertension -initially allowed for permissive HTN in setting of acute stroke; neurology ok with resuming home antihypertensives (Monopril 10 mg), but given stable BPs with meds held, continue to hold for now.  Monitor BP.  Resume antihypertensives when indicated.  Leukocytosis - suspect reactive in setting of stroke.  No evidence of infection, afebrile.  Monitor clinically.  No antibiotic warranted at this time.  Patient BMI: Body mass index is 21.56 kg/m.    DVT prophylaxis: Place and maintain sequential compression device Start: 10/22/20 1418 SCDs Start: 10/19/20 2317   Diet:  Diet Orders (From admission, onward)     Start     Ordered   10/19/20 2315  Diet Carb Modified Fluid consistency: Thin; Room service appropriate? Yes  Diet effective now       Question Answer Comment  Diet-HS Snack? Nothing   Calorie Level Medium 1600-2000   Fluid consistency: Thin   Room service appropriate? Yes      10/19/20 2314              Code Status: Full Code   Brief Narrative / Hospital Course to Date:   87 yo female with past history of hypertension, type 2 diabetes, CKD stage III who presented with right sided weakness, gait dysfunction, and speech dysfunction beginning 12:30 on 6/26. MRI showed left basal ganglia small ischemic stroke, no hemorrhage.  Admitted to [facility] service with neurology consulted for further evaluation.  Patient noted to have worsening neurologic symptoms on 6/28 and had repeat MRI which showed stroke enlarged from 5 mm to 15 mm.    Physical and occupational therapy recommending [name] for rehab.  Patient previously fully independent with ambulation and ADLs.    Subjective 10/23/20    Patient seen up in chair, daughter at bedside this morning.  Patient says feeling well.  No change in her right-sided weakness, not better or worse.  Has been working on her speech and notes improvement.  No other acute complaints.  Eager to go to rehab.   Disposition Plan & Communication   Status is: Inpatient  Remains inpatient appropriate because:Ongoing diagnostic  testing needed not appropriate for outpatient work up and requires [name] for stroke rehab - placement pending insurance auth and available bed.  Dispo: The patient is from: Home              Anticipated d/c is to: [name]              Patient currently is not medically stable to d/c.   Difficult to place patient No  Family Communication: daughter at bedside on  rounds   Consults, Procedures, Significant Events   Consultants:  Neurology Vascular surgery Cardiology  Procedures:  None  Antimicrobials:  Anti-infectives (From admission, onward)    None         Micro    Objective   Vitals:   10/23/20 0612 10/23/20 0644 10/23/20 0724 10/23/20 1218  BP: (!) 140/54  (!) 137/46 (!) 147/58  Pulse: 64  63 78  Resp: 15 16 16 16   Temp: 97.6 F (36.4 C)  98 F (36.7 C) 97.7 F (36.5 C)  TempSrc:      SpO2: 94%  96% 99%  Weight:      Height:        Intake/Output Summary (Last 24 hours) at 10/23/2020 1555 Last data filed at 10/23/2020 1013 Gross per 24 hour  Intake 240 ml  Output --  Net 240 ml   Filed Weights   10/20/20 0045 10/20/20 0151 10/21/20 0450  Weight: 58.7 kg 58.7 kg 55.2 kg    Physical Exam:  General exam: awake, alert, no acute distress Respiratory system: normal respiratory effort, on room air. Cardiovascular system: S1/S2, RRR, no pedal edema.   Central nervous system: A&O x3.  Normal speech. CN's grossly intact.  Moves all extremities, RUE/RLE with mild weakness vs left. Psychiatry: normal mood, congruent affect, judgement and insight appear normal  Labs   Data Reviewed: I have personally reviewed following labs and imaging studies  CBC: Recent Labs  Lab 10/19/20 2101 10/20/20 0423 10/21/20 0405 10/23/20 0432  WBC 8.1 6.7 12.3* 13.3*  NEUTROABS 3.9  --   --   --   HGB 11.9* 10.7* 11.6* 11.5*  HCT 35.7* 32.9* 34.7* 34.4*  MCV 94.9 94.8 94.3 94.0  PLT 242 206 220 213   Basic Metabolic Panel: Recent Labs  Lab 10/19/20 2101 10/20/20 0423 10/21/20 0405 10/22/20 0427 10/23/20 0432  NA 135 140 138 139 140  K 4.3 4.2 4.7 4.3 4.3  CL 107 111 109 108 108  CO2 20* 23 23 22 25   GLUCOSE 245* 144* 141* 125* 142*  BUN 28* 26* 27* 31* 30*  CREATININE 1.40* 1.34* 1.51* 1.57* 1.51*  CALCIUM 8.9 9.0 9.0 9.1 9.2  MG 1.9 1.8  --   --   --    GFR: Estimated Creatinine Clearance: 20.5 mL/min (A) (by  C-G formula based on SCr of 1.51 mg/dL (H)). Liver Function Tests: Recent Labs  Lab 10/19/20 2101 10/20/20 0423  AST 18 16  ALT 12 11  ALKPHOS 103 83  BILITOT 0.7 0.5  PROT 7.3 6.5  ALBUMIN 4.1 3.5   No results for input(s): LIPASE, AMYLASE in the last 168 hours. No results for input(s): AMMONIA in the last 168 hours. Coagulation Profile: Recent Labs  Lab 10/19/20 2101  INR 1.1   Cardiac Enzymes: No results for input(s): CKTOTAL, CKMB, CKMBINDEX, TROPONINI in the last 168 hours. BNP (last 3 results) No results for input(s): PROBNP in the last 8760 hours. HbA1C: No results for input(s): HGBA1C in the last  72 hours.  CBG: Recent Labs  Lab 10/21/20 2030 10/22/20 0837 10/22/20 2121 10/23/20 0725 10/23/20 1219  GLUCAP 283* 144* 135* 131* 166*   Lipid Profile: No results for input(s): CHOL, HDL, LDLCALC, TRIG, CHOLHDL, LDLDIRECT in the last 72 hours.  Thyroid Function Tests: No results for input(s): TSH, T4TOTAL, FREET4, T3FREE, THYROIDAB in the last 72 hours. Anemia Panel: No results for input(s): VITAMINB12, FOLATE, FERRITIN, TIBC, IRON, RETICCTPCT in the last 72 hours. Sepsis Labs: No results for input(s): PROCALCITON, LATICACIDVEN in the last 168 hours.  Recent Results (from the past 240 hour(s))  Resp Panel by RT-PCR (Flu A&B, Covid) Nasopharyngeal Swab     Status: None   Collection Time: 10/19/20 10:26 PM   Specimen: Nasopharyngeal Swab; Nasopharyngeal(NP) swabs in vial transport medium  Result Value Ref Range Status   SARS Coronavirus 2 by RT PCR NEGATIVE NEGATIVE Final    Comment: (NOTE) SARS-CoV-2 target nucleic acids are NOT DETECTED.  The SARS-CoV-2 RNA is generally detectable in upper respiratory specimens during the acute phase of infection. The lowest concentration of SARS-CoV-2 viral copies this assay can detect is 138 copies/mL. A negative result does not preclude SARS-Cov-2 infection and should not be used as the sole basis for treatment  or other patient management decisions. A negative result may occur with  improper specimen collection/handling, submission of specimen other than nasopharyngeal swab, presence of viral mutation(s) within the areas targeted by this assay, and inadequate number of viral copies(<138 copies/mL). A negative result must be combined with clinical observations, patient history, and epidemiological information. The expected result is Negative.  Fact Sheet for Patients:  BloggerCourse.com  Fact Sheet for Healthcare Providers:  SeriousBroker.it  This test is no t yet approved or cleared by the [place] FDA and  has been authorized for detection and/or diagnosis of SARS-CoV-2 by FDA under an Emergency Use Authorization (EUA). This EUA will remain  in effect (meaning this test can be used) for the duration of the COVID-19 declaration under Section 564(b)(1) of the Act, 21 U.S.C.section 360bbb-3(b)(1), unless the authorization is terminated  or revoked sooner.       Influenza A by PCR NEGATIVE NEGATIVE Final   Influenza B by PCR NEGATIVE NEGATIVE Final    Comment: (NOTE) The Xpert Xpress SARS-CoV-2/FLU/RSV plus assay is intended as an aid in the diagnosis of influenza from Nasopharyngeal swab specimens and should not be used as a sole basis for treatment. Nasal washings and aspirates are unacceptable for Xpert Xpress SARS-CoV-2/FLU/RSV testing.  Fact Sheet for Patients: BloggerCourse.com  Fact Sheet for Healthcare Providers: SeriousBroker.it  This test is not yet approved or cleared by the [place] FDA and has been authorized for detection and/or diagnosis of SARS-CoV-2 by FDA under an Emergency Use Authorization (EUA). This EUA will remain in effect (meaning this test can be used) for the duration of the COVID-19 declaration under Section 564(b)(1) of the Act, 21 U.S.C. section  360bbb-3(b)(1), unless the authorization is terminated or revoked.  Performed at [facility], [address], [place], [place] [address]   Urine Culture     Status: Abnormal   Collection Time: 10/19/20 10:26 PM   Specimen: Urine, Random  Result Value Ref Range Status   Specimen Description   Final    URINE, RANDOM Performed at [facility], [address], [place], [place] [address]    Special Requests   Final    NONE Performed at [facility], [address] [place] [place], [place] [address]  Culture MULTIPLE SPECIES PRESENT, SUGGEST RECOLLECTION (A)  Final   Report Status 10/21/2020 FINAL  Final      Imaging Studies   No results found.   Medications   Scheduled Meds:  aspirin  81 mg Oral Daily   atorvastatin  80 mg Oral Daily   clopidogrel  75 mg Oral Daily   insulin aspart  0-5 Units Subcutaneous QHS   levothyroxine  88 mcg Oral Q0600   Continuous Infusions:     LOS: 3 days    Time spent: 25 minutes with >50% spent at bedside and in coordination of care.    Pennie BanterKelly A Lisette Mancebo, DO [facility]  10/23/2020, 3:55 PM      If 7PM-7AM, please contact night-coverage. How to contact the [facility] Attending or Consulting provider 7A - 7P or covering provider during after hours 7P -7A, for this patient?    Check the care team in [facility] and look for a) attending/consulting TR[facility] provider listed and b) the [facility] team listed Log into [email or web address] and use [place]'s universal password to access. If you do not have the password, please contact the hospital operator. Locate the [facility] provider you are looking for under [facility] and page to a number that you can be directly reached. If you still have difficulty reaching the provider, please page the [facility] (Director on Call) for the Hospitalists listed on amion for assistance.

## 2020-10-23 NOTE — Progress Notes (Signed)
Pt Left antecubital IV removed due to swelling, redness and tenderness at IV site. Per. MD, Andrez Grime, patient may remain without IV access.

## 2020-10-23 NOTE — Progress Notes (Signed)
Physical Therapy Treatment Patient Details Name: Cassandra Schaefer MRN: [id number] DOB: [birth year] Today's Date: 10/23/2020    History of Present Illness 87 y.o. female with medical history significant for type 2 diabetes mellitus, essential hypertension, acquired hypothyroidism, hyperlipidemia, stage III chronic kidney disease with baseline creatinine 1.4-1.7, who is admitted to [facility] on 10/19/2020 with suspected TIA versus acute ischemic CVA after presenting from home to [facility] ED complaining of right-sided weakness. Confirmed L basal ganglia ischemic stroke.    PT Comments    Pt was sitting in recliner with supportive granddaughter at bedside. She is A and O and extremely pleasant. Eager for PT session. Session focused on improving gait quality. Pt has poor lateral wt shift to L to allow RLE advancement. With min assist to wt shift was able to improve step quality however still poor overall gait kinematics. Still requiring RUE walker splint to maintain grasp on RW. Pt did fatigue quickly however HR, sao2 and RR were stable throughout.   Follow Up Recommendations  [name]     Equipment Recommendations  Rolling walker with 5" wheels       Precautions / Restrictions Precautions Precautions: Fall Restrictions Weight Bearing Restrictions: No    Mobility  Bed Mobility      General bed mobility comments: in recliner pre/post session    Transfers Overall transfer level: Needs assistance Equipment used: Rolling walker (2 wheeled) Transfers: Sit to/from Stand Sit to Stand: Min assist         General transfer comment: Min assist for safety with assistance placing RUE in RW splint  Ambulation/Gait Ambulation/Gait assistance: Min assist Gait Distance (Feet): 75 Feet Assistive device: Rolling walker (2 wheeled) Gait Pattern/deviations: Step-to pattern;Ataxic;Decreased weight shift to left;Decreased stride length;Decreased step length - right Gait velocity:  decreased   General Gait Details: Pt ambulated with more of a step to pattern versus step through. Poor L lateral wt shift to allow RLE advancement. Needs min assist to wt shift. very slow gait cadance      Balance Overall balance assessment: Needs assistance Sitting-balance support: Feet supported Sitting balance-Leahy Scale: Good Sitting balance - Comments: no LOB in sitting   Standing balance support: Bilateral upper extremity supported;During functional activity Standing balance-Leahy Scale: Poor Standing balance comment: high fall risk/R Lateral lean      Cognition Arousal/Alertness: Awake/alert Behavior During Therapy: WFL for tasks assessed/performed Overall Cognitive Status: Within Functional Limits for tasks assessed        General Comments: Pt is A and O and pleasant/motivated             Pertinent Vitals/Pain Pain Assessment: No/denies pain     PT Goals (current goals can now be found in the care plan section) Acute Rehab PT Goals Patient Stated Goal: walk better Progress towards PT goals: Progressing toward goals    Frequency    7X/week      PT Plan Current plan remains appropriate       AM-PAC PT "6 Clicks" Mobility   Outcome Measure  Help needed turning from your back to your side while in a flat bed without using bedrails?: None Help needed moving from lying on your back to sitting on the side of a flat bed without using bedrails?: A Little Help needed moving to and from a bed to a chair (including a wheelchair)?: A Little Help needed standing up from a chair using your arms (e.g., wheelchair or bedside chair)?: A Little Help needed to walk in hospital room?:  A Lot Help needed climbing 3-5 steps with a railing? : A Lot 6 Click Score: 17    End of Session Equipment Utilized During Treatment: Gait belt Activity Tolerance: Patient tolerated treatment well;Patient limited by fatigue Patient left: in chair;with call bell/phone within  reach;with family/visitor present Nurse Communication: Mobility status PT Visit Diagnosis: Muscle weakness (generalized) (M62.81);Difficulty in walking, not elsewhere classified (R26.2)     Time: 5956-3875 PT Time Calculation (min) (ACUTE ONLY): 23 min  Charges:  $Gait Training: 8-22 mins $Neuromuscular Re-education: 8-22 mins                     Jetta Lout PTA 10/23/20, 10:10 AM

## 2020-10-23 NOTE — Care Management Important Message (Signed)
Important Message  Patient Details  Name: Cassandra Schaefer MRN: [id number] Date of Birth: Mar 16, 1929   Medicare Important Message Given:  Yes     Olegario Messier A Nillie Bartolotta 10/23/2020, 2:12 PM

## 2020-10-23 NOTE — Progress Notes (Signed)
Occupational Therapy Treatment Patient Details Name: Deem Marmol MRN: [id number] DOB: [birth year] Today's Date: 10/23/2020    History of present illness 87 y.o. female with medical history significant for type 2 diabetes mellitus, essential hypertension, acquired hypothyroidism, hyperlipidemia, stage III chronic kidney disease with baseline creatinine 1.4-1.7, who is admitted to [facility] on 10/19/2020 with suspected TIA versus acute ischemic CVA after presenting from home to [facility] ED complaining of right-sided weakness. Confirmed L basal ganglia ischemic stroke.   OT comments  Upon entering the room, pt seated in recliner chair with family present in room. Pt very pleasant and agreeable to OT intervention. Pt verbalized having more sensation in R UE this session. She did attempt to eat lunch with R UE and states, " I was bad". OT recommended pt attempt with finger foods and B hand over hand to bring cup to mouth. Pt given green resistive theraputty for R hand coordination and strengthening exercises. Pt having some difficulty but manages all exercises with increased time and effort. Pt having increased difficulty with grasp and release as well to be focused on during next session. Theraputty left in pt's room with instruction for her to utilize again on her own after dinner. All needs within reach and pt remains up in recliner chair. Pt continues to benefit from acute OT intervention with continued recommendation for intensive inpatient rehab to address functional deficits before returning home.   Follow Up Recommendations  Supervision/Assistance - 24 hour;[name]    Equipment Recommendations  Other (comment) (defer to next venue of care)       Precautions / Restrictions Precautions Precautions: Fall       Mobility Bed Mobility               General bed mobility comments: Pt seated in recliner chair              Vision Wears Glasses: At all times             Cognition Arousal/Alertness: Awake/alert Behavior During Therapy: WFL for tasks assessed/performed Overall Cognitive Status: Within Functional Limits for tasks assessed                                 General Comments: Pt is A and O and pleasant/motivated                   Pertinent Vitals/ Pain       Pain Assessment: No/denies pain   Frequency  Min 3X/week        Progress Toward Goals  OT Goals(current goals can now be found in the care plan section)  Progress towards OT goals: Progressing toward goals  Acute Rehab OT Goals Patient Stated Goal: to go to rehab OT Goal Formulation: With patient/family Time For Goal Achievement: 11/03/20 Potential to Achieve Goals: Good  Plan Discharge plan remains appropriate;Frequency remains appropriate       AM-PAC OT "6 Clicks" Daily Activity     Outcome Measure   Help from another person eating meals?: A Little Help from another person taking care of personal grooming?: A Little Help from another person toileting, which includes using toliet, bedpan, or urinal?: A Little Help from another person bathing (including washing, rinsing, drying)?: A Lot Help from another person to put on and taking off regular upper body clothing?: A Little Help from another person to put on and taking off regular lower body clothing?:  A Lot 6 Click Score: 16    End of Session    OT Visit Diagnosis: Unsteadiness on feet (R26.81);Repeated falls (R29.6);Muscle weakness (generalized) (M62.81)   Activity Tolerance Patient tolerated treatment well   Patient Left in chair;with call bell/phone within reach;with chair alarm set;with family/visitor present   Nurse Communication Mobility status        Time: 2595-6387 OT Time Calculation (min): 24 min  Charges: OT General Charges $OT Visit: 1 Visit OT Treatments $Neuromuscular Re-education: 23-37 mins  Jackquline Denmark, MS, OTR/L , CBIS ascom [phone]  10/23/20, 4:09  PM

## 2020-10-23 NOTE — Progress Notes (Signed)
Chaplain Maggie coordinated notary and witnesses to sign patient's Advanced Directive order after physician confirmed patient's capacity to make decision on her own. Patient appreciated the service.

## 2020-10-23 NOTE — Progress Notes (Signed)
Cassandra Schaefer and Vascular Surgery  Daily Progress Note   Subjective  -   Sitting in a chair. No major events. Slightly improved  Objective Vitals:   10/23/20 0612 10/23/20 0644 10/23/20 0724 10/23/20 1218  BP: (!) 140/54  (!) 137/46 (!) 147/58  Pulse: 64  63 78  Resp: 15 16 16 16   Temp: 97.6 F (36.4 C)  98 F (36.7 C) 97.7 F (36.5 C)  TempSrc:      SpO2: 94%  96% 99%  Weight:      Height:        Intake/Output Summary (Last 24 hours) at 10/23/2020 1402 Last data filed at 10/23/2020 1013 Gross per 24 hour  Intake 240 ml  Output --  Net 240 ml    PULM  CTAB CV  RRR VASC  Right sided weakness still present but slightly improved. Aphasia also slightly improved.  Laboratory CBC    Component Value Date/Time   WBC 13.3 (H) 10/23/2020 0432   HGB 11.5 (L) 10/23/2020 0432   HCT 34.4 (L) 10/23/2020 0432   PLT 213 10/23/2020 0432    BMET    Component Value Date/Time   NA 140 10/23/2020 0432   K 4.3 10/23/2020 0432   CL 108 10/23/2020 0432   CO2 25 10/23/2020 0432   GLUCOSE 142 (H) 10/23/2020 0432   BUN 30 (H) 10/23/2020 0432   CREATININE 1.51 (H) 10/23/2020 0432   CALCIUM 9.2 10/23/2020 0432   GFRNONAA 33 (L) 10/23/2020 0432    Assessment/Planning:   Left hemispheric stroke with 70% or greater left ICA stenosis. Would agree with inpatient rehab to improve functional status Would plan left CEA in 7-14 days and the timing for this will likely work out well after 7-10 days in inpatient rehab.  Cont ASA and Plavix for now.  Stop plavix 5 days preop.    11-08-1997  10/23/2020, 2:02 PM

## 2020-10-23 NOTE — Progress Notes (Signed)
Chaplain Maggie responded to call from 1C RN Station and made initial visit with patient and her daughter. Patient is interested in completing AD paperwork. Patient's daughter communicated that her mother's signature is compromised because of stroke. Chaplain reached out to [place] on 1C who is a Conservation officer, nature on the family's behalf to get information about their question related to the signature quality. Darl Pikes explained a signature can be made with an X, but when a patient has suffered a stroke is is necessary for the doctor to assess the patient's capacity to make decisions. Chaplain reached out to Dr. Denton Lank through secure text per Nurse Thayer Ohm' suggestion to request assessment. Chaplain will follow up and is available per on call pager or secure text messaging as needed.

## 2020-10-24 ENCOUNTER — Inpatient Hospital Stay (HOSPITAL_COMMUNITY)
Admission: RE | Admit: 2020-10-24 | Discharge: 2020-11-07 | DRG: 057 | Disposition: A | Discharge status: Home / Self Care | Payer: Medicare HMO | Source: Other Acute Inpatient Hospital | Attending: Physical Medicine & Rehabilitation | Admitting: Physical Medicine & Rehabilitation

## 2020-10-24 ENCOUNTER — Encounter (HOSPITAL_COMMUNITY): Payer: Self-pay | Admitting: Physical Medicine & Rehabilitation

## 2020-10-24 ENCOUNTER — Other Ambulatory Visit: Payer: Self-pay

## 2020-10-24 DIAGNOSIS — E119 Type 2 diabetes mellitus without complications: Secondary | ICD-10-CM | POA: Diagnosis not present

## 2020-10-24 DIAGNOSIS — M109 Gout, unspecified: Secondary | ICD-10-CM | POA: Diagnosis present

## 2020-10-24 DIAGNOSIS — Z880 Allergy status to penicillin: Secondary | ICD-10-CM | POA: Diagnosis not present

## 2020-10-24 DIAGNOSIS — E1165 Type 2 diabetes mellitus with hyperglycemia: Secondary | ICD-10-CM | POA: Diagnosis present

## 2020-10-24 DIAGNOSIS — Z7984 Long term (current) use of oral hypoglycemic drugs: Secondary | ICD-10-CM | POA: Diagnosis not present

## 2020-10-24 DIAGNOSIS — Z79899 Other long term (current) drug therapy: Secondary | ICD-10-CM

## 2020-10-24 DIAGNOSIS — Z79890 Hormone replacement therapy (postmenopausal): Secondary | ICD-10-CM | POA: Diagnosis not present

## 2020-10-24 DIAGNOSIS — I639 Cerebral infarction, unspecified: Secondary | ICD-10-CM

## 2020-10-24 DIAGNOSIS — I129 Hypertensive chronic kidney disease with stage 1 through stage 4 chronic kidney disease, or unspecified chronic kidney disease: Secondary | ICD-10-CM | POA: Diagnosis present

## 2020-10-24 DIAGNOSIS — E1122 Type 2 diabetes mellitus with diabetic chronic kidney disease: Secondary | ICD-10-CM | POA: Diagnosis present

## 2020-10-24 DIAGNOSIS — E039 Hypothyroidism, unspecified: Secondary | ICD-10-CM | POA: Diagnosis present

## 2020-10-24 DIAGNOSIS — N390 Urinary tract infection, site not specified: Secondary | ICD-10-CM | POA: Diagnosis present

## 2020-10-24 DIAGNOSIS — I6522 Occlusion and stenosis of left carotid artery: Secondary | ICD-10-CM

## 2020-10-24 DIAGNOSIS — D6489 Other specified anemias: Secondary | ICD-10-CM | POA: Diagnosis present

## 2020-10-24 DIAGNOSIS — I69320 Aphasia following cerebral infarction: Secondary | ICD-10-CM | POA: Diagnosis not present

## 2020-10-24 DIAGNOSIS — Z89422 Acquired absence of other left toe(s): Secondary | ICD-10-CM | POA: Diagnosis not present

## 2020-10-24 DIAGNOSIS — I10 Essential (primary) hypertension: Secondary | ICD-10-CM | POA: Diagnosis not present

## 2020-10-24 DIAGNOSIS — Z882 Allergy status to sulfonamides status: Secondary | ICD-10-CM | POA: Diagnosis not present

## 2020-10-24 DIAGNOSIS — N183 Chronic kidney disease, stage 3 unspecified: Secondary | ICD-10-CM | POA: Diagnosis present

## 2020-10-24 DIAGNOSIS — I69351 Hemiplegia and hemiparesis following cerebral infarction affecting right dominant side: Principal | ICD-10-CM

## 2020-10-24 DIAGNOSIS — Z87891 Personal history of nicotine dependence: Secondary | ICD-10-CM

## 2020-10-24 DIAGNOSIS — N189 Chronic kidney disease, unspecified: Secondary | ICD-10-CM

## 2020-10-24 DIAGNOSIS — R531 Weakness: Secondary | ICD-10-CM | POA: Diagnosis present

## 2020-10-24 DIAGNOSIS — I69322 Dysarthria following cerebral infarction: Secondary | ICD-10-CM | POA: Diagnosis not present

## 2020-10-24 DIAGNOSIS — D649 Anemia, unspecified: Secondary | ICD-10-CM | POA: Diagnosis present

## 2020-10-24 LAB — CBC
HCT: 34.1 % — ABNORMAL LOW (ref 36.0–46.0)
Hemoglobin: 11.5 g/dL — ABNORMAL LOW (ref 12.0–15.0)
MCH: 31.1 pg (ref 26.0–34.0)
MCHC: 33.7 g/dL (ref 30.0–36.0)
MCV: 92.2 fL (ref 80.0–100.0)
Platelets: 217 10*3/uL (ref 150–400)
RBC: 3.7 MIL/uL — ABNORMAL LOW (ref 3.87–5.11)
RDW: 12.1 % (ref 11.5–15.5)
WBC: 13.3 10*3/uL — ABNORMAL HIGH (ref 4.0–10.5)
nRBC: 0 % (ref 0.0–0.2)

## 2020-10-24 LAB — BASIC METABOLIC PANEL
Anion gap: 8 (ref 5–15)
BUN: 29 mg/dL — ABNORMAL HIGH (ref 8–23)
CO2: 23 mmol/L (ref 22–32)
Calcium: 8.8 mg/dL — ABNORMAL LOW (ref 8.9–10.3)
Chloride: 105 mmol/L (ref 98–111)
Creatinine, Ser: 1.53 mg/dL — ABNORMAL HIGH (ref 0.44–1.00)
GFR, Estimated: 32 mL/min — ABNORMAL LOW (ref >60)
Glucose, Bld: 147 mg/dL — ABNORMAL HIGH (ref 70–99)
Potassium: 3.9 mmol/L (ref 3.5–5.1)
Sodium: 136 mmol/L (ref 135–145)

## 2020-10-24 LAB — GLUCOSE, CAPILLARY
Glucose-Capillary: 223 mg/dL — ABNORMAL HIGH (ref 70–99)
Glucose-Capillary: 183 mg/dL — ABNORMAL HIGH (ref 70–99)

## 2020-10-24 MED ORDER — ACETAMINOPHEN 325 MG PO TABS
325.0000 mg | ORAL_TABLET | ORAL | Status: DC | PRN
  Administered 2020-11-05 – 2020-11-06 (×2): 650 mg via ORAL
  Filled 2020-10-24 (×2): qty 2

## 2020-10-24 MED ORDER — CLOPIDOGREL BISULFATE 75 MG PO TABS: 75.0000 mg | ORAL_TABLET | Freq: Every day | ORAL | Status: DC

## 2020-10-24 MED ORDER — TRAZODONE HCL 50 MG PO TABS
25.0000 mg | ORAL_TABLET | Freq: Every evening | ORAL | Status: DC | PRN
  Filled 2020-10-24: qty 1

## 2020-10-24 MED ORDER — ASPIRIN 81 MG PO CHEW: 81.0000 mg | CHEWABLE_TABLET | Freq: Every day | ORAL | Status: AC

## 2020-10-24 MED ORDER — ATORVASTATIN CALCIUM 80 MG PO TABS
80.0000 mg | ORAL_TABLET | Freq: Every day | ORAL | Status: DC
  Administered 2020-10-25 – 2020-11-06 (×13): 80 mg via ORAL
  Filled 2020-10-24 (×13): qty 1

## 2020-10-24 MED ORDER — INSULIN ASPART 100 UNIT/ML IJ SOLN: 0.0000 [IU] | Freq: Three times a day (TID) | INTRAMUSCULAR | 11 refills | Status: DC

## 2020-10-24 MED ORDER — ENOXAPARIN SODIUM 30 MG/0.3ML IJ SOSY
30.0000 mg | PREFILLED_SYRINGE | INTRAMUSCULAR | Status: DC
  Administered 2020-10-24 – 2020-10-31 (×8): 30 mg via SUBCUTANEOUS
  Filled 2020-10-24 (×8): qty 0.3

## 2020-10-24 MED ORDER — INSULIN ASPART 100 UNIT/ML IJ SOLN
0.0000 [IU] | Freq: Three times a day (TID) | INTRAMUSCULAR | Status: DC
  Administered 2020-10-24: 3 [IU] via SUBCUTANEOUS
  Administered 2020-10-25: 2 [IU] via SUBCUTANEOUS
  Administered 2020-10-25 – 2020-10-27 (×4): 1 [IU] via SUBCUTANEOUS
  Administered 2020-10-28: 2 [IU] via SUBCUTANEOUS
  Administered 2020-10-29 – 2020-10-31 (×5): 1 [IU] via SUBCUTANEOUS
  Administered 2020-10-31: 2 [IU] via SUBCUTANEOUS
  Administered 2020-11-01: 3 [IU] via SUBCUTANEOUS
  Administered 2020-11-04 – 2020-11-07 (×5): 1 [IU] via SUBCUTANEOUS

## 2020-10-24 MED ORDER — DIPHENHYDRAMINE HCL 12.5 MG/5ML PO ELIX: 12.5000 mg | ORAL_SOLUTION | Freq: Four times a day (QID) | ORAL | Status: DC | PRN

## 2020-10-24 MED ORDER — FLEET ENEMA 7-19 GM/118ML RE ENEM: 1.0000 | ENEMA | Freq: Once | RECTAL | Status: DC | PRN

## 2020-10-24 MED ORDER — PROCHLORPERAZINE 25 MG RE SUPP: 12.5000 mg | Freq: Four times a day (QID) | RECTAL | Status: DC | PRN

## 2020-10-24 MED ORDER — ASPIRIN 81 MG PO CHEW
81.0000 mg | CHEWABLE_TABLET | Freq: Every day | ORAL | Status: DC
  Administered 2020-10-25 – 2020-11-07 (×14): 81 mg via ORAL
  Filled 2020-10-24 (×14): qty 1

## 2020-10-24 MED ORDER — PROCHLORPERAZINE EDISYLATE 10 MG/2ML IJ SOLN: 5.0000 mg | Freq: Four times a day (QID) | INTRAMUSCULAR | Status: DC | PRN

## 2020-10-24 MED ORDER — BISACODYL 10 MG RE SUPP: 10.0000 mg | Freq: Every day | RECTAL | Status: DC | PRN

## 2020-10-24 MED ORDER — LEVOTHYROXINE SODIUM 88 MCG PO TABS
88.0000 ug | ORAL_TABLET | Freq: Every day | ORAL | Status: DC
  Administered 2020-10-25 – 2020-11-07 (×14): 88 ug via ORAL
  Filled 2020-10-24 (×18): qty 1

## 2020-10-24 MED ORDER — LIVING WELL WITH DIABETES BOOK
Freq: Once | Status: AC
  Administered 2020-10-24: 1
  Filled 2020-10-24: qty 1

## 2020-10-24 MED ORDER — INSULIN ASPART 100 UNIT/ML IJ SOLN: 0.0000 [IU] | Freq: Every day | INTRAMUSCULAR | Status: DC

## 2020-10-24 MED ORDER — BLOOD PRESSURE CONTROL BOOK
Freq: Once | Status: AC
  Administered 2020-10-24: 1
  Filled 2020-10-24: qty 1

## 2020-10-24 MED ORDER — GUAIFENESIN-DM 100-10 MG/5ML PO SYRP: 5.0000 mL | ORAL_SOLUTION | Freq: Four times a day (QID) | ORAL | Status: DC | PRN

## 2020-10-24 MED ORDER — CLOPIDOGREL BISULFATE 75 MG PO TABS
75.0000 mg | ORAL_TABLET | Freq: Every day | ORAL | Status: DC
  Administered 2020-10-25 – 2020-11-07 (×14): 75 mg via ORAL
  Filled 2020-10-24 (×14): qty 1

## 2020-10-24 MED ORDER — POLYETHYLENE GLYCOL 3350 17 G PO PACK: 17.0000 g | PACK | Freq: Every day | ORAL | Status: DC | PRN

## 2020-10-24 MED ORDER — METFORMIN HCL ER 500 MG PO TB24
500.0000 mg | ORAL_TABLET | Freq: Every day | ORAL | Status: DC
  Administered 2020-10-24 – 2020-11-06 (×14): 500 mg via ORAL
  Filled 2020-10-24 (×14): qty 1

## 2020-10-24 MED ORDER — ATORVASTATIN CALCIUM 80 MG PO TABS: 80.0000 mg | ORAL_TABLET | Freq: Every day | ORAL | Status: DC

## 2020-10-24 MED ORDER — VITAMIN B-12 1000 MCG PO TABS
2000.0000 ug | ORAL_TABLET | Freq: Every day | ORAL | Status: DC
  Administered 2020-10-25 – 2020-11-07 (×14): 2000 ug via ORAL
  Filled 2020-10-24 (×14): qty 2

## 2020-10-24 MED ORDER — ALUM & MAG HYDROXIDE-SIMETH 200-200-20 MG/5ML PO SUSP: 30.0000 mL | ORAL | Status: DC | PRN

## 2020-10-24 MED ORDER — PROCHLORPERAZINE MALEATE 5 MG PO TABS: 5.0000 mg | ORAL_TABLET | Freq: Four times a day (QID) | ORAL | Status: DC | PRN

## 2020-10-24 NOTE — Progress Notes (Signed)
Inpatient Rehabilitation Admissions Coordinator   I have insurance approval for [name] admit at [facility] for today. I have contacted her daughter, [name], by phone and she is in agreement. I will contact acute team and TOC to arrange the admission for today.  Ottie Glazier, RN, MSN Rehab Admissions Coordinator [phone] 10/24/2020 9:51 AM

## 2020-10-24 NOTE — PMR Pre-admission (Addendum)
PMR Admission Coordinator Pre-Admission Assessment  Patient: Cassandra Schaefer is an 87 y.o., female MRN: [id number] DOB: [birth year] Height: 5\' 3"  (160 cm) Weight: 60.7 kg             Insurance Information HMO:     PPO:      PCP:      IPA:      80/20:      OTHER:  PRIMARY: [employer]      Policy#:      Subscriber: pt CM Name: [id number]      Phone#: [phone]     Fax#: [phone] Pre-Cert#: [id number] approved for 7 days with f/u [id number] phone [phone] fax [phone]      Employer:  Benefits:  Phone #: online     Name:  Eff. Date: 04/26/2020     Deduct: none      Out of Pocket Max: $1000      Life Max: none  [name] $150 co pay per admit    SNF: 100% Outpatient: $20 per visit    Co-Pay: visits per medical neccesity Home Health: 100%      Co-Pay: visits per medical neccesity DME: 100%     Co-Pay: none Providers: in network  SECONDARY: none      Policy#:       Phone#:   06/24/2020:       Phone#:   [employer] [facility]" for patients in Inpatient Rehabilitation Facilities with attached "Privacy Act Statement-Health Care Records" was provided and verbally reviewed with: Family  Emergency Contact Information Contact Information     Name Relation Home Work Mobile   Data processing manager Daughter [phone]  [phone]      Current Medical History  Patient Admitting Diagnosis CVA  History of Present Illness: 87 year old female with past medical history of HTN, type 2 diabetes, CKD stage III who presented on 6/26 with right sided weakness, gait dysfunction and speech dysfunction . MRI showed left basal ganglia small ischemic CVA, no hemorrhage. Neurology consulted. Noted worsening neurologic symptoms on 6/28 and repeat MRI showed stroke enlarged from 5 mm to 15 mm. CTA with carotid bulb plaque and vertebral artery plaque w/ severe stenosis. SLP passed swallow eval. TTE unremarkable. Occasional tachycardia on tel but no afib/flutter. Vascular  surgery consulted and plan either CEA or stenting after rehab. Will need to stop PLAVIX 5 days prior to procedure. Continue ASA, Plavix and high intensity Lipitor for now. Cardiology follow up for consideration of Ziopatch monitor.  Hgb A1c 7.0 was on metformin, held due to renal function. Covered with sensitive sliding scale Novolog. Continue Levothyroxine for hypothyroidism.7/28 Allowed permissive HTN initially. Neurology ok resuming home antihypertensives of Monopril .   Complete NIHSS TOTAL: 2    Past Medical History  Past Medical History:  Diagnosis Date   Anemia    Cough    RESOLVING, NO FEVER   Diabetes mellitus without complication (HCC)    Gout    Hypertension    Hypothyroidism    PUD (peptic ulcer disease)    Wears dentures    full upper, partial lower    Family History  family history is not on file.  Prior Rehab/Hospitalizations:  Has the patient had prior rehab or hospitalizations prior to admission? Yes  Has the patient had major surgery during 100 days prior to admission? No  Current Medications   Current Facility-Administered Medications:    acetaminophen (TYLENOL) tablet 650 mg, 650 mg, Oral, Q6H PRN **OR** acetaminophen (TYLENOL)  suppository 650 mg, 650 mg, Rectal, Q6H PRN, Howerter, Justin B, DO   aspirin chewable tablet 81 mg, 81 mg, Oral, Daily, Howerter, Justin B, DO, 81 mg at 10/24/20 0936   atorvastatin (LIPITOR) tablet 80 mg, 80 mg, Oral, Daily, Wouk, Wilfred Curtis, MD, 80 mg at 10/24/20 3710   clopidogrel (PLAVIX) tablet 75 mg, 75 mg, Oral, Daily, Caryl Pina, MD, 75 mg at 10/24/20 0936   hydrALAZINE (APRESOLINE) injection 10 mg, 10 mg, Intravenous, Q4H PRN, Howerter, Justin B, DO   insulin aspart (novoLOG) injection 0-5 Units, 0-5 Units, Subcutaneous, QHS, Howerter, Justin B, DO, 3 Units at 10/21/20 2047   insulin aspart (novoLOG) injection 0-9 Units, 0-9 Units, Subcutaneous, TID WC, Esaw Grandchild A, DO   levothyroxine (SYNTHROID) tablet 88 mcg, 88  mcg, Oral, Q0600, Howerter, Justin B, DO, 88 mcg at 10/24/20 0609  Patients Current Diet:  Diet Order             Diet - low sodium heart healthy           Diet Carb Modified Fluid consistency: Thin; Room service appropriate? Yes  Diet effective now                  Precautions / Restrictions Precautions Precautions: Fall Restrictions Weight Bearing Restrictions: No   Has the patient had 2 or more falls or a fall with injury in the past year?No  Prior Activity Level Community (5-7x/wk): Independent, drivng and very active  Prior Functional Level Prior Function Level of Independence: Independent with assistive device(s) Comments: Pt reports being I with self care tasks, mobility, and IADL tasks. Pt uses SPC for community mobility but not within the home.  Active, enjoys yoga/exercise at senior center 1-2x/week  Self Care: Did the patient need help bathing, dressing, using the toilet or eating?  Independent  Indoor Mobility: Did the patient need assistance with walking from room to room (with or without device)? Independent  Stairs: Did the patient need assistance with internal or external stairs (with or without device)? Independent  Functional Cognition: Did the patient need help planning regular tasks such as shopping or remembering to take medications? Independent  Home Assistive Devices / Equipment Home Assistive Devices/Equipment: None Home Equipment: Cane - single point  Prior Device Use: Indicate devices/aids used by the patient prior to current illness, exacerbation or injury?  Cane in community only  Current Functional Level Cognition  Overall Cognitive Status: Within Functional Limits for tasks assessed Orientation Level: Oriented X4 General Comments: Pt is A and O and pleasant/motivated    Extremity Assessment (includes Sensation/Coordination)  Upper Extremity Assessment:  (R UE grossly 4-/5; mild paresthesia fingertips of bilat hands (baseline  neuropathy), unchanged with this admission) RUE Deficits / Details: 3+/5 gross strength RUE Sensation: history of peripheral neuropathy RUE Coordination: decreased fine motor, decreased gross motor  Lower Extremity Assessment:  (R LE grossly 4/5, denies numbness/tingling)    ADLs  Overall ADL's : Needs assistance/impaired Lower Body Dressing: Moderate assistance Lower Body Dressing Details (indicate cue type and reason): don/doff B socks. Difficulty grasping with R UE and R lateral lean while seated attempting to complete tasks. General ADL Comments: supervision - min A for functional mobility nd self care tasks without use of AD.    Mobility  Overal bed mobility: Needs Assistance Bed Mobility: Supine to Sit, Sit to Supine Supine to sit: Min assist Sit to supine: Min assist General bed mobility comments: Pt seated in recliner chair    Transfers  Overall transfer level: Needs assistance Equipment used: Rolling walker (2 wheeled) Transfers: Sit to/from Stand Sit to Stand: Min assist Stand pivot transfers: Mod assist General transfer comment: Min assist for safety with assistance placing RUE in RW splint    Ambulation / Gait / Stairs / Wheelchair Mobility  Ambulation/Gait Ambulation/Gait assistance: Editor, commissioningMin assist Gait Distance (Feet): 75 Feet Assistive device: Rolling walker (2 wheeled) Gait Pattern/deviations: Step-to pattern, Ataxic, Decreased weight shift to left, Decreased stride length, Decreased step length - right General Gait Details: Pt ambulated with more of a step to pattern versus step through. Poor L lateral wt shift to allow RLE advancement. Needs min assist to wt shift. very slow gait cadance Gait velocity: decreased    Posture / Balance Dynamic Sitting Balance Sitting balance - Comments: no LOB in sitting Balance Overall balance assessment: Needs assistance Sitting-balance support: Feet supported Sitting balance-Leahy Scale: Good Sitting balance - Comments: no LOB  in sitting Postural control: Right lateral lean Standing balance support: Bilateral upper extremity supported, During functional activity Standing balance-Leahy Scale: Poor Standing balance comment: high fall risk/R Lateral lean Standardized Balance Assessment Standardized Balance Assessment : Berg Balance Test Berg Balance Test Sit to Stand: Able to stand using hands after several tries Standing Unsupported: Needs several tries to stand 30 seconds unsupported Sitting with Back Unsupported but Feet Supported on Floor or Stool: Able to sit 2 minutes under supervision Stand to Sit: Controls descent by using hands Transfers: Needs one person to assist Standing Unsupported with Eyes Closed: Needs help to keep from falling Standing Ubsupported with Feet Together: Needs help to attain position and unable to hold for 15 seconds From Standing, Reach Forward with Outstretched Arm: Reaches forward but needs supervision From Standing Position, Pick up Object from Floor: Unable to pick up and needs supervision From Standing Position, Turn to Look Behind Over each Shoulder: Needs supervision when turning Turn 360 Degrees: Needs assistance while turning Standing Unsupported, Alternately Place Feet on Step/Stool: Needs assistance to keep from falling or unable to try Standing Unsupported, One Foot in Front: Needs help to step but can hold 15 seconds Standing on One Leg: Unable to try or needs assist to prevent fall Total Score: 14    Special needs/care consideration Hgb A1c 7 Will need either CEA or stent after rehab     Previous Home Environment  Living Arrangements:  (daughter , Aurther Lofterry)  Lives With: Daughter Available Help at Discharge: Family, Available 24 hours/day Type of Home: House Home Layout: One level Home Access: Ramped entrance Bathroom Shower/Tub: Engineer, manufacturing systemsTub/shower unit Bathroom Toilet: Standard Bathroom Accessibility: Yes How Accessible: Accessible via walker Home Care Services:  No Additional Comments: Pt goes to senior center 2x/wk for exercise.  Discharge Living Setting Plans for Discharge Living Setting: Lives with (comment) (daughter) Type of Home at Discharge: House Discharge Home Layout: One level Discharge Home Access: Ramped entrance Discharge Bathroom Shower/Tub: Tub/shower unit Discharge Bathroom Toilet: Standard Discharge Bathroom Accessibility: Yes How Accessible: Accessible via walker Does the patient have any problems obtaining your medications?: No  Social/Family/Support Systems Contact Information: Aurther Lofterry, daughter Anticipated Caregiver: daughter and family Anticipated Caregiver's Contact Information: [phone] Ability/Limitations of Caregiver: Aurther Lofterry, [facility] but will arrnage 24/7 as needed Caregiver Availability: 24/7 Discharge Plan Discussed with Primary Caregiver: Yes Is Caregiver In Agreement with Plan?: Yes Does Caregiver/Family have Issues with Lodging/Transportation while Pt is in Rehab?: No  Goals Patient/Family Goal for Rehab: Mod I to supervision wiht PT, OT and SLP Expected length of stay: ELOS  7 to 10 days Additional Information: will need CEA or stenting after rehab stay Pt/Family Agrees to Admission and willing to participate: Yes Program Orientation Provided & Reviewed with Pt/Caregiver Including Roles  & Responsibilities: Yes  Decrease burden of Care through IP rehab admission: n/a   Possible need for SNF placement upon discharge:not anticipated  Patient Condition: This patient's medical and functional status has changed since the consult dated: 10/21/2020 in which the Rehabilitation Physician determined and documented that the patient's condition is appropriate for intensive rehabilitative care in an inpatient rehabilitation facility. See "History of Present Illness" (above) for medical update. Functional changes are: min assist. Patient's medical and functional status update has been discussed with the Rehabilitation  physician and patient remains appropriate for inpatient rehabilitation. Will admit to inpatient rehab today.  Preadmission Screen Completed By:  Clois Dupes, RN, 10/24/2020 10:09 AM ______________________________________________________________________   Discussed status with Dr. Riley Kill on 10/24/2020 at  1030 and received approval for admission today.  Admission Coordinator:  Clois Dupes, time [id number] 10/24/2020

## 2020-10-24 NOTE — Progress Notes (Signed)
Inpatient Rehabilitation  Patient information reviewed and entered into eRehab system by Kristoff Coonradt M. Anayia Eugene, M.A., CCC/SLP, PPS Coordinator.  Information including medical coding, functional ability and quality indicators will be reviewed and updated through discharge.    

## 2020-10-24 NOTE — Progress Notes (Signed)
Horton Chinaulkar, Krutika P, MD   Physician  Physical Medicine and Rehabilitation  Consult Note      Signed  Date of Service:  10/21/2020  4:42 PM       Related encounter: ED to Hosp-Admission (Discharged) from 10/19/2020 [facility] (1C)       Signed      Expand All Collapse All     Show:Clear all [x] Written[x] Templated[] Copied  Added by: [x] Raulkar, Drema PryKrutika P, MD   [] Hover for details                                                                                                                                                                                                                                                                                                                                   Physical Medicine and Rehabilitation Consult Reason for Consult: Left basal ganglia ischemic infarct     HPI: Cassandra BonitoJanet Ann Schaefer is an incredibly high functioning 87 year old woman with PMH of Type 2 DM, essential HTN, acquired hypothyroidism, HLD, stage 3 CKD with baseling creatinine 1.4- 1.7 who [facility] with L basal ganglia ischemic stroke. Physical Medicine & Rehabilitation was consulted to assess candidacy for [name].       Review of Systems Constitutional:  Negative for malaise/fatigue. HENT: Negative.    Eyes: Negative.   Respiratory: Negative.    Cardiovascular: Negative.   Gastrointestinal: Negative.   Genitourinary: Negative.   Musculoskeletal: Negative.   Skin: Negative.   Neurological:  Positive for speech change. Endo/Heme/Allergies: Negative.   Psychiatric/Behavioral: Negative.        Past Medical History:  Diagnosis Date   Anemia     Cough      RESOLVING, NO FEVER   Diabetes mellitus without complication (HCC)     Gout     Hypertension     Hypothyroidism     PUD (peptic ulcer disease)  Wears dentures       full upper, partial lower         Past Surgical History:  Procedure Laterality Date   AMPUTATION TOE Left 12/14/2017    Procedure: AMPUTATION TOE-4TH MPJ;  Surgeon: Gwyneth Revels, DPM;  Location: [facility];  Service: Podiatry;  Laterality: Left;  IVA LOCAL Diabetic - oral meds   APPENDECTOMY       CATARACT EXTRACTION W/PHACO Right 06/23/2015    Procedure: CATARACT EXTRACTION PHACO AND INTRAOCULAR LENS PLACEMENT (IOC);  Surgeon: Sallee Lange, MD;  Location: [facility] ORS;  Service: Ophthalmology;  Laterality: Right;  [place] 01:28 AP% 23.4 CDE 36.14 fluid pack lot # [id number]   CATARACT EXTRACTION W/PHACO Left 07/28/2015    Procedure: CATARACT EXTRACTION PHACO AND INTRAOCULAR LENS PLACEMENT (IOC);  Surgeon: Sallee Lange, MD;  Location: [facility] ORS;  Service: Ophthalmology;  Laterality: Left;  [place]    1:29.7 AP%  24.9 CDE   41.32 fluid casette lot #[id number] H exp 09/23/2016   COONOSCOPY        AND ENDOSCOPY   DILATION AND CURETTAGE OF UTERUS       TONSILLECTOMY       TUBAL LIGATION             Family History  Problem Relation Age of Onset   Breast cancer Neg Hx      Social History:  reports that she quit smoking about 36 years ago. Her smoking use included cigarettes. She has never used smokeless tobacco. She reports that she does not drink alcohol. No history on file for drug use. Allergies:       Allergies  Allergen Reactions   Penicillins Hives   Sulfa Antibiotics Hives   Tetanus Toxoids Swelling   Procaine Other (See Comments)      "went into shock"   Tuberculin Tests Swelling and Rash          Medications Prior to Admission  Medication Sig Dispense Refill   fosinopril (MONOPRIL) 10 MG tablet Take 10 mg by mouth at bedtime.       iron polysaccharides (NIFEREX) 150 MG capsule Take 150 mg by mouth daily.       levothyroxine (SYNTHROID, LEVOTHROID) 88 MCG tablet Take 88 mcg by mouth daily before breakfast.       metFORMIN (GLUCOPHAGE-XR) 500 MG 24 hr tablet Take 500  mg by mouth at bedtime.       Multiple Vitamins-Minerals (ICAPS AREDS 2 PO) Take by mouth 2 (two) times daily. Reported on 06/23/2015       simvastatin (ZOCOR) 20 MG tablet Take 20 mg by mouth at bedtime.       vitamin B-12 (CYANOCOBALAMIN) 1000 MCG tablet Take 2,000 mcg by mouth daily.       glucose blood test strip USE TO TEST BLOOD SUGAR DAILY. DX CODE: E11.9       HYDROcodone-acetaminophen (NORCO) 5-325 MG tablet Take 1 tablet by mouth every 6 (six) hours as needed for moderate pain. (Patient not taking: No sig reported) 20 tablet 0      Home: Home Living Family/patient expects to be discharged to:: Private residence Living Arrangements: Children Available Help at Discharge: Family, Available 24 hours/day Type of Home: House Home Access: Ramped entrance Home Layout: One level Bathroom Shower/Tub: Tub/shower unit Home Equipment: Gilmer Mor - single point Additional Comments: Pt [facility] to senior center 2x/wk for exercise.  Lives With: Daughter  Functional History: Prior Function Level of Independence: Independent with assistive device(s) Comments: Pt reports being I  with self care tasks, mobility, and IADL tasks. Pt uses SPC for community mobility but not within the home.  Active, enjoys yoga/exercise at senior center 1-2x/week Functional Status:  Mobility: Bed Mobility Overal bed mobility: Needs Assistance Bed Mobility: Supine to Sit, Sit to Supine Supine to sit: Min assist Sit to supine: Min assist General bed mobility comments: Pt was in recliner pre/post session Transfers Overall transfer level: Needs assistance Equipment used: None Transfers: Sit to/from Stand, Stand Pivot Transfers Sit to Stand: Mod assist Stand pivot transfers: Mod assist General transfer comment: pt needing assistance to weight shift and mod multimodal cuing for sequencing forward step Ambulation/Gait Ambulation/Gait assistance: Min assist, Mod assist Gait Distance (Feet): 45 Feet Assistive device: Rolling  walker (2 wheeled) Gait Pattern/deviations: Step-to pattern, Scissoring, Staggering left, Staggering right, Ataxic General Gait Details: Pt required use of AD today due to severe balance deficits. unsafe to trial without AD. more assistance required to prevent falling. has ~ 5 episodes of scissoring with intervention to prevent falling. Fatigues extremely quickly. constant vcs throughout for wt shift to allow RLE advancement. does have some ataxic movements. required use of RUE hand splint to maintain grasp on RW. Gait velocity: decreased   ADL: ADL Overall ADL's : Needs assistance/impaired Lower Body Dressing: Moderate assistance Lower Body Dressing Details (indicate cue type and reason): don/doff B socks. Difficulty grasping with R UE and R lateral lean while seated attempting to complete tasks. General ADL Comments: supervision - min A for functional mobility nd self care tasks without use of AD.   Cognition: Cognition Overall Cognitive Status: Within Functional Limits for tasks assessed Orientation Level: Oriented X4 Cognition Arousal/Alertness: Awake/alert Behavior During Therapy: WFL for tasks assessed/performed Overall Cognitive Status: Within Functional Limits for tasks assessed General Comments: Pt with difficulty word finding and slurred speech this session. Pt remains pleasant and cooperative overall.   Blood pressure (!) 144/62, pulse 67, temperature 97.9 F (36.6 C), resp. rate 20, height  (1.6 m), weight 55.2 kg, SpO2 98 %. Physical Exam Gen: no distress, normal appearing. 2 very supportive daughters at bedside.  HEENT: oral mucosa pink and moist, NCAT Cardio: Reg rate Chest: normal effort, normal rate of breathing Abd: soft, non-distended Ext: no edema Psych: pleasant, normal affect Skin: intact Neuro: Alert and oriented x3. Dysarthric speech Musculoskeletal: 4/5 right sided strength, 5/5 left sided strength   Lab Results Last 24 Hours       Results for  orders placed or performed during the hospital encounter of 10/19/20 (from the past 24 hour(s))  Glucose, capillary     Status: Abnormal    Collection Time: 10/20/20  8:40 PM  Result Value Ref Range    Glucose-Capillary 120 (H) 70 - 99 mg/dL  CBC     Status: Abnormal    Collection Time: 10/21/20  4:05 AM  Result Value Ref Range    WBC 12.3 (H) 4.0 - 10.5 K/uL    RBC 3.68 (L) 3.87 - 5.11 MIL/uL    Hemoglobin 11.6 (L) 12.0 - 15.0 g/dL    HCT 16.1 (L) 09.6 - 46.0 %    MCV 94.3 80.0 - 100.0 fL    MCH 31.5 26.0 - 34.0 pg    MCHC 33.4 30.0 - 36.0 g/dL    RDW 04.5 40.9 - 81.1 %    Platelets 220 150 - 400 K/uL    nRBC 0.0 0.0 - 0.2 %  Basic metabolic panel     Status: Abnormal    Collection Time:  10/21/20  4:05 AM  Result Value Ref Range    Sodium 138 135 - 145 mmol/L    Potassium 4.7 3.5 - 5.1 mmol/L    Chloride 109 98 - 111 mmol/L    CO2 23 22 - 32 mmol/L    Glucose, Bld 141 (H) 70 - 99 mg/dL    BUN 27 (H) 8 - 23 mg/dL    Creatinine, Ser 3.25 (H) 0.44 - 1.00 mg/dL    Calcium 9.0 8.9 - 49.8 mg/dL    GFR, Estimated 33 (L) >60 mL/min    Anion gap 6 5 - 15  Glucose, capillary     Status: Abnormal    Collection Time: 10/21/20  7:46 AM  Result Value Ref Range    Glucose-Capillary 128 (H) 70 - 99 mg/dL       Imaging Results (Last 48 hours)  CT ANGIO HEAD NECK W WO CM   Result Date: 10/19/2020 CLINICAL DATA:  Initial evaluation for acute right-sided weakness, speech difficulty. EXAM: CT ANGIOGRAPHY HEAD AND NECK TECHNIQUE: Multidetector CT imaging of the head and neck was performed using the standard protocol during bolus administration of intravenous contrast. Multiplanar CT image reconstructions and MIPs were obtained to evaluate the vascular anatomy. Carotid stenosis measurements (when applicable) are obtained utilizing NASCET criteria, using the distal internal carotid diameter as the denominator. CONTRAST:  31mL OMNIPAQUE IOHEXOL 350 MG/ML SOLN COMPARISON:  None. FINDINGS: CT HEAD  FINDINGS Brain: Generalized age-related cerebral atrophy. Few small remote lacunar infarcts noted about the right basal ganglia and right thalamus. No acute intracranial hemorrhage. No acute large vessel territory infarct. No mass lesion, midline shift or mass effect. No hydrocephalus or extra-axial fluid collection. Vascular: No hyperdense vessel. Scattered vascular calcifications noted within the carotid siphons. Skull: Scalp soft tissues demonstrate no acute finding. Calvarium intact. Sinuses: Changes of chronic sinusitis noted. No active disease. No mastoid effusion. Orbits:  Globes orbital soft tissues demonstrate no acute finding. CTA NECK FINDINGS Aortic arch: Visualized aortic arch normal caliber with normal 3 vessel morphology. Moderate atheromatous change about the arch and origin of the great vessels without high-grade stenosis. Right carotid system: Right CCA patent from its origin to the bifurcation without stenosis. Eccentric mixed plaque at the right carotid bulb/proximal right ICA with associated mild 35% stenosis by NASCET criteria. Right ICA patent distally without stenosis, dissection or occlusion. Left carotid system: The CCA patent from its origin to the bifurcation without stenosis. Bulky calcified plaque about the left carotid bulb with associated stenosis of up to 70% by NASCET criteria. Left ICA patent distally without stenosis, dissection or occlusion. Vertebral arteries: Both vertebral arteries arise from subclavian arteries. No significant proximal subclavian artery stenosis. Strongly dominant left vertebral artery with a diffusely hypoplastic right vertebral artery. Atheromatous plaque at the origin of the dominant left vertebral artery with associated severe stenosis (series 10, image 77). Dominant left vertebral artery otherwise patent within the neck. Focal severe distal right V3 stenosis noted just prior to cranial vault (series 10, image 196). Skeleton: No visible acute osseous  finding. No discrete or worrisome osseous lesions. Other neck: No other acute soft tissue abnormality within the neck. No mass or adenopathy. Upper chest: Scattered interlobular septal thickening noted within the visualized lungs, suggesting mild pulmonary interstitial edema. Mildly prominent precarinal node measures up to the upper limits of normal at 1 cm. Visualized upper chest demonstrates no other acute finding. Review of the MIP images confirms the above findings CTA HEAD FINDINGS Anterior circulation: Petrous segments  patent bilaterally. Scattered atheromatous change within the carotid siphons without high-grade stenosis. A1 segments patent bilaterally. Normal anterior communicating artery complex. Anterior cerebral arteries patent to their distal aspects without stenosis. No M1 stenosis or occlusion. Normal MCA bifurcations. Distal MCA branches well perfused and symmetric. Posterior circulation: Dominant left vertebral artery widely patent to the vertebrobasilar junction. Right vertebral artery markedly hypoplastic and irregular, but is grossly patent to the vertebrobasilar junction as well. Right PICA origin patent. Left PICA not seen. Basilar patent to its distal aspect without stenosis. Superior cerebral arteries patent bilaterally. Left PCA supplied via the basilar and is widely patent to its distal aspect. Fetal type origin of the right PCA. Atheromatous change throughout the right PCA with associated moderate multifocal P2/P3 stenoses. Venous sinuses: Grossly patent allowing for timing the contrast bolus. Anatomic variants: Fetal type origin of the right PCA.  No aneurysm. Review of the MIP images confirms the above findings IMPRESSION: CT HEAD IMPRESSION: 1. No acute intracranial abnormality. 2. Few small remote lacunar infarcts about the right basal ganglia and thalamus. CTA HEAD AND NECK IMPRESSION: 1. Negative CTA for emergent large vessel occlusion. 2. Bulky calcified plaque about the left carotid  bulb with associated stenosis of up to 70% by NASCET criteria. 3. Atheromatous plaque at the origin of the dominant left vertebral artery with associated severe stenosis. Strongly dominant left vertebral artery otherwise patent within the neck. 4. Atheromatous change throughout the right PCA with associated moderate multifocal P2/P3 stenoses. 5. Mild pulmonary interstitial edema. Electronically Signed   By: Rise Mu M.D.   On: 10/19/2020 22:55   MR BRAIN WO CONTRAST   Result Date: 10/21/2020 CLINICAL DATA:  Stroke follow-up. EXAM: MRI HEAD WITHOUT CONTRAST TECHNIQUE: Multiplanar, multiecho pulse sequences of the brain and surrounding structures were obtained without intravenous contrast. COMPARISON:  MRI of the brain October 20, 2020. FINDINGS: Brain: Interval progression of area of restricted diffusion involving the left ventrolateral thalamus, measuring approximately 15 mm, consistent with acute infarct. No hemorrhage, hydrocephalus, extra-axial collection or mass lesion. Remote lacunar infarct in the right thalamus. Small amount of scattered foci of T2 hyperintensity are seen within the white matter of the cerebral hemispheres and within the pons, nonspecific, most likely related to chronic small vessel ischemia. Mild parenchymal volume loss. Vascular: Absent flow void in the V3 and V4 segments of the right vertebral artery may be related to slow flow versus occlusion. Skull and upper cervical spine: Normal marrow signal. Sinuses/Orbits: Bilateral lens surgery. Paranasal sinuses are clear. Other: None. IMPRESSION: Significant interval progression of the area of restricted diffusion within the left ventrolateral thalamus consistent with acute infarct, now measuring 15 mm. Electronically Signed   By: Baldemar Lenis M.D.   On: 10/21/2020 15:32   MR BRAIN WO CONTRAST   Result Date: 10/20/2020 CLINICAL DATA:  Initial evaluation for neuro deficit, stroke suspected. Acute right-sided  weakness. EXAM: MRI HEAD WITHOUT CONTRAST TECHNIQUE: Multiplanar, multiecho pulse sequences of the brain and surrounding structures were obtained without intravenous contrast. COMPARISON:  Prior CTA from 10/19/2020. FINDINGS: Brain: Mild diffuse prominence of the CSF containing spaces compatible generalized cerebral atrophy. Scattered patchy T2/FLAIR hyperintensity within the periventricular and deep white matter both cerebral hemispheres as well as the pons, most consistent with chronic small vessel ischemic disease, mild for age. Few scattered remote lacunar infarcts present about the basal ganglia and thalami. There is a subtle punctate 5 mm focus of diffusion signal abnormality involving the left basal ganglia, only seen on coronal  DWI sequence (series 7, image 18). Given the patient history of right-sided weakness, findings suspicious for a tiny punctate ischemic infarct. No associated hemorrhage or mass effect. No other diffusion abnormality to suggest acute or subacute ischemia. Gray-white matter differentiation otherwise maintained. No other areas of remote cortical infarction. No other evidence for acute or chronic intracranial hemorrhage. No mass lesion, midline shift or mass effect. No hydrocephalus or extra-axial fluid collection. Pituitary gland suprasellar region within normal limits. Midline structures intact. Vascular: Major intracranial vascular flow voids are maintained. Skull and upper cervical spine: Craniocervical junction within normal limits. Bone marrow signal intensity normal. No scalp soft tissue abnormality. Sinuses/Orbits: Patient status post bilateral ocular lens replacement. Globes and orbital soft tissues within normal limits. Paranasal sinuses are largely clear. No significant mastoid effusion. Inner ear structures grossly normal. Other: None. IMPRESSION: 1. Subtle 5 mm focus of diffusion signal abnormality involving the left basal ganglia, suspicious for a tiny ischemic infarct given  the patient history of right-sided weakness. No associated hemorrhage or mass effect. 2. No other acute intracranial abnormality. 3. Mild chronic microvascular ischemic disease with a few scattered remote lacunar infarcts about the basal ganglia and thalami. Electronically Signed   By: Rise Mu M.D.   On: 10/20/2020 05:49   US Carotid Bilateral   Result Date: 10/21/2020 CLINICAL DATA:  87 year old female with a history of stroke EXAM: BILATERAL CAROTID DUPLEX ULTRASOUND TECHNIQUE: Wallace Cullens scale imaging, color Doppler and duplex ultrasound were performed of bilateral carotid and vertebral arteries in the neck. COMPARISON:  None. FINDINGS: Criteria: Quantification of carotid stenosis is based on velocity parameters that correlate the residual internal carotid diameter with NASCET-based stenosis levels, using the diameter of the distal internal carotid lumen as the denominator for stenosis measurement. The following velocity measurements were obtained: RIGHT ICA:  Systolic 82 cm/sec, Diastolic 19 cm/sec CCA:  60 cm/sec SYSTOLIC ICA/CCA RATIO:  1.4 ECA:  225 cm/sec LEFT ICA:  Systolic 106 cm/sec, Diastolic 19 cm/sec CCA:  75 cm/sec SYSTOLIC ICA/CCA RATIO:  1.4 ECA:  192 cm/sec Right Brachial SBP: Not acquired Left Brachial SBP: Not acquired RIGHT CAROTID ARTERY: No significant calcifications of the right common carotid artery. Intermediate waveform maintained. Moderate heterogeneous and partially calcified plaque at the right carotid bifurcation. No significant lumen shadowing. Low resistance waveform of the right ICA. No significant tortuosity. RIGHT VERTEBRAL ARTERY: Antegrade flow with low resistance waveform. LEFT CAROTID ARTERY: No significant calcifications of the left common carotid artery. Intermediate waveform maintained. Moderate heterogeneous and partially calcified plaque at the left carotid bifurcation. No significant lumen shadowing. Low resistance waveform of the left ICA. No significant  tortuosity. LEFT VERTEBRAL ARTERY:  Antegrade flow with low resistance waveform. IMPRESSION: Color duplex indicates moderate heterogeneous and calcified plaque, with no hemodynamically significant stenosis by duplex criteria in the extracranial cerebrovascular circulation. Signed, Yvone Neu. Reyne Dumas, RPVI [facility] Electronically Signed   By: Gilmer Mor D.O.   On: 10/21/2020 16:03   ECHOCARDIOGRAM COMPLETE BUBBLE STUDY   Result Date: 10/20/2020    ECHOCARDIOGRAM REPORT   Patient Name:   [facility] Date of Exam: 10/20/2020 Medical Rec #:  [id number]       Height:       63.0 in Accession #:    [id number]      Weight:       129.4 lb Date of Birth:  [birth year]       BSA:  1.607 m Patient Age:    89 years        BP:           168/62 mmHg Patient Gender: F               HR:           83 bpm. Exam Location:  [facility] Procedure: 2D Echo, Cardiac Doppler, Color Doppler and Saline Contrast Bubble            Study Indications:     TIA 434.9 / G45.9  History:         Patient has no prior history of Echocardiogram examinations.                  Risk Factors:Hypertension and Diabetes.  Sonographer:     Cristela Blue RDCS (AE) Referring Phys:  [id number] Angie Fava Diagnosing Phys: Lorine Bears MD  Sonographer Comments: Suboptimal parasternal window. IMPRESSIONS  1. Left ventricular ejection fraction, by estimation, is 55 to 60%. The left ventricle has normal function. Left ventricular endocardial border not optimally defined to evaluate regional wall motion. There is mild left ventricular hypertrophy. Left ventricular diastolic parameters are indeterminate.  2. Right ventricular systolic function is normal. The right ventricular size is normal. There is mildly elevated pulmonary artery systolic pressure.  3. The mitral valve is normal in structure. Mild mitral valve regurgitation. No evidence of mitral stenosis. Moderate mitral annular calcification.   4. The aortic valve is normal in structure. Aortic valve regurgitation is not visualized. Mild to moderate aortic valve sclerosis/calcification is present, without any evidence of aortic stenosis.  5. The inferior vena cava is normal in size with greater than 50% respiratory variability, suggesting right atrial pressure of 3 mmHg.  6. Agitated saline contrast bubble study was negative, with no evidence of any interatrial shunt. FINDINGS  Left Ventricle: Left ventricular ejection fraction, by estimation, is 55 to 60%. The left ventricle has normal function. Left ventricular endocardial border not optimally defined to evaluate regional wall motion. The left ventricular internal cavity size was normal in size. There is mild left ventricular hypertrophy. Left ventricular diastolic parameters are indeterminate. Right Ventricle: The right ventricular size is normal. No increase in right ventricular wall thickness. Right ventricular systolic function is normal. There is mildly elevated pulmonary artery systolic pressure. The tricuspid regurgitant velocity is 3.18  m/s, and with an assumed right atrial pressure of 3 mmHg, the estimated right ventricular systolic pressure is 43.4 mmHg. Left Atrium: Left atrial size was normal in size. Right Atrium: Right atrial size was normal in size. Pericardium: There is no evidence of pericardial effusion. Mitral Valve: The mitral valve is normal in structure. Moderate mitral annular calcification. Mild mitral valve regurgitation. No evidence of mitral valve stenosis. Tricuspid Valve: The tricuspid valve is normal in structure. Tricuspid valve regurgitation is trivial. No evidence of tricuspid stenosis. Aortic Valve: The aortic valve is normal in structure. Aortic valve regurgitation is not visualized. Mild to moderate aortic valve sclerosis/calcification is present, without any evidence of aortic stenosis. Aortic valve mean gradient measures 4.5 mmHg. Aortic valve peak gradient measures  9.4 mmHg. Aortic valve area, by VTI measures 2.66 cm. Pulmonic Valve: The pulmonic valve was normal in structure. Pulmonic valve regurgitation is not visualized. No evidence of pulmonic stenosis. Aorta: The aortic root is normal in size and structure. Venous: The inferior vena cava is normal in size with greater than 50% respiratory variability, suggesting right atrial pressure of 3 mmHg.  IAS/Shunts: No atrial level shunt detected by color flow Doppler. Agitated saline contrast was given intravenously to evaluate for intracardiac shunting. Agitated saline contrast bubble study was negative, with no evidence of any interatrial shunt.  LEFT VENTRICLE PLAX 2D LVIDd:         2.75 cm  Diastology LVIDs:         2.02 cm  LV e' medial:    6.31 cm/s LV PW:         1.18 cm  LV E/e' medial:  18.5 LV IVS:        0.90 cm  LV e' lateral:   6.85 cm/s LVOT diam:     2.00 cm  LV E/e' lateral: 17.1 LV SV:         85 LV SV Index:   53 LVOT Area:     3.14 cm  RIGHT VENTRICLE RV Basal diam:  2.97 cm RV S prime:     16.90 cm/s TAPSE (M-mode): 3.2 cm LEFT ATRIUM           Index       RIGHT ATRIUM          Index LA diam:      2.60 cm 1.62 cm/m  RA Area:     7.99 cm LA Vol (A2C): 24.4 ml 15.18 ml/m RA Volume:   13.80 ml 8.59 ml/m LA Vol (A4C): 26.3 ml 16.37 ml/m  AORTIC VALVE                    PULMONIC VALVE AV Area (Vmax):    2.42 cm     PV Vmax:        0.37 m/s AV Area (Vmean):   2.12 cm     PV Peak grad:   0.5 mmHg AV Area (VTI):     2.66 cm     RVOT Peak grad: 1 mmHg AV Vmax:           153.00 cm/s AV Vmean:          101.250 cm/s AV VTI:            0.320 m AV Peak Grad:      9.4 mmHg AV Mean Grad:      4.5 mmHg LVOT Vmax:         118.00 cm/s LVOT Vmean:        68.400 cm/s LVOT VTI:          0.271 m LVOT/AV VTI ratio: 0.85  AORTA Ao Root diam: 2.77 cm MITRAL VALVE                TRICUSPID VALVE MV Area (PHT): 3.60 cm     TR Peak grad:   40.4 mmHg MV Decel Time: 211 msec     TR Vmax:        318.00 cm/s MV E velocity: 117.00  cm/s MV A velocity: 138.00 cm/s  SHUNTS MV E/A ratio:  0.85         Systemic VTI:  0.27 m                             Systemic Diam: 2.00 cm Lorine Bears MD Electronically signed by Lorine Bears MD Signature Date/Time: 10/20/2020/1:44:50 PM    Final          Assessment/Plan: Diagnosis: L basal ganglia infarction Does the need for close, 24 hr/day medical supervision in concert with the patient's rehab  needs make it unreasonable for this patient to be served in a less intensive setting? Yes Co-Morbidities requiring supervision/potential complications: Type 2 DM, stage III CKD, essential HTN, hypothyroidism, HLD Due to bladder management, bowel management, safety, skin/wound care, disease management, medication administration, pain management, and patient education, does the patient require 24 hr/day rehab nursing? Yes Does the patient require coordinated care of a physician, rehab nurse, therapy disciplines of PT, OT, SLP to address physical and functional deficits in the context of the above medical diagnosis(es)? Yes Addressing deficits in the following areas: balance, endurance, locomotion, strength, transferring, bowel/bladder control, bathing, dressing, feeding, grooming, toileting, speech, and psychosocial support Can the patient actively participate in an intensive therapy program of at least 3 hrs of therapy per day at least 5 days per week? Yes The potential for patient to make measurable gains while on inpatient rehab is excellent Anticipated functional outcomes upon discharge from inpatient rehab are supervision  with PT, supervision with OT, supervision with SLP. Estimated rehab length of stay to reach the above functional goals is: 7 days Anticipated discharge destination: Home Overall Rehab/Functional Prognosis: excellent   RECOMMENDATIONS: This patient's condition is appropriate for continued rehabilitative care in the following setting: [name] Patient has agreed to participate in  recommended program. Yes Note that insurance prior authorization may be required for reimbursement for recommended care.   Comment: She would be an excellent candidate and incredibly supportive family to help her on discharge. Incredibly high functioning for her age! Thank you for this consult. Admission coordinator to follow.   I have personally performed a face to face diagnostic evaluation, including, but not limited to relevant history and physical exam findings, of this patient and developed relevant assessment and plan.  Additionally, I have reviewed and concur with the physician assistant's documentation above.   Sula Soda, MD                 Routing History              Note Details  Author Horton Chin, MD File Time 10/21/2020  5:00 PM  Author Type Physician Status Signed  Last Editor Horton Chin, MD Service Physical Medicine and Rehabilitation  [facility]ospital Acct # [id number] Admit Date 10/24/2020

## 2020-10-24 NOTE — H&P (Signed)
Physical Medicine and Rehabilitation Admission H&P        Chief Complaint  Patient presents with   Cerebrovascular Accident with functional deficits      HPI: Cassandra Schaefer. Cassandra Schaefer is a 87 year old right-handed female with history of T2DM, HTN, hypothyroidism, CKD III who was admitted to [facility] on 10/19/2020 with difficulty walking due to right-sided weakness and sensory deficits as well as word finding deficits that started the day before.  BP elevated at admission patient outside tPA window.  MRI brain done showing subtle diffusion abnormality left basal ganglia with few scattered remote lacunar infarcts.  CTA head neck showed 70% stenosis left carotid bulb with associated severe stenosis at origin of dominant left-VA.  Stroke felt to be likely due to carotid artery stenosis and Dr. Ocie Bob consulted for input but patient had worsening of symptoms with lethargy on 06/27.  Follow-up MRI showed progression of infarct with increasing signs from 5 to 15 mm.  Plavix added to ASA per input from Dr. Otelia Limes.     She has had issues with tachycardia with occasional PACs but no A. Fib--Cardiology felt that stroke due to CAS and recommended CEA when appropriate.  Dr. Wyn Quaker recommended waiting a couple of weeks prior to stenting of L-CEA. Patient with resultant mild dysarthria with word finding deficits and right sided weakness affecting mobility and ADLs. [name] recommended due to functional decline.     Review of Systems Constitutional:  Negative for fever. HENT:  Negative for tinnitus.   Eyes:  Negative for double vision and photophobia. Respiratory:  Negative for cough and hemoptysis.   Cardiovascular:  Negative for chest pain and palpitations. Gastrointestinal:  Negative for nausea and vomiting. Genitourinary:  Negative for frequency and urgency. Musculoskeletal:  Negative for myalgias and neck pain. Skin:  Negative for rash. Neurological:  Positive for focal weakness and weakness. Negative for  dizziness. Psychiatric/Behavioral:  Negative for depression and suicidal ideas.           Past Medical History:  Diagnosis Date   Anemia     Cough      RESOLVING, NO FEVER   Diabetes mellitus without complication (HCC)     Gout     Hypertension     Hypothyroidism     PUD (peptic ulcer disease)     Wears dentures      full upper, partial lower           Past Surgical History:  Procedure Laterality Date   AMPUTATION TOE Left 12/14/2017    Procedure: AMPUTATION TOE-4TH MPJ;  Surgeon: Gwyneth Revels, DPM;  Location: [facility];  Service: Podiatry;  Laterality: Left;  IVA LOCAL Diabetic - oral meds   APPENDECTOMY       CATARACT EXTRACTION W/PHACO Right 06/23/2015    Procedure: CATARACT EXTRACTION PHACO AND INTRAOCULAR LENS PLACEMENT (IOC);  Surgeon: Sallee Lange, MD;  Location: [facility];  Service: Ophthalmology;  Laterality: Right;  [place] 01:28 AP% 23.4 CDE 36.14 fluid pack lot # [id number]   CATARACT EXTRACTION W/PHACO Left 07/28/2015    Procedure: CATARACT EXTRACTION PHACO AND INTRAOCULAR LENS PLACEMENT (IOC);  Surgeon: Sallee Lange, MD;  Location: [facility];  Service: Ophthalmology;  Laterality: Left;  [place]    1:29.7 AP%  24.9 CDE   41.32 fluid casette lot #[id number] H exp 09/23/2016   COONOSCOPY        AND ENDOSCOPY   DILATION AND CURETTAGE OF UTERUS       TONSILLECTOMY  TUBAL LIGATION               Family History  Problem Relation Age of Onset   Breast cancer Neg Hx        Social History:  reports that she quit smoking about 36 years ago. Her smoking use included cigarettes. She has never used smokeless tobacco. She reports that she does not drink alcohol. No history on file for drug use.          Allergies  Allergen Reactions   Penicillins Hives   Sulfa Antibiotics Hives   Tetanus Toxoids Swelling   Procaine Other (See Comments)      "went into shock"   Tuberculin Tests Swelling and Rash            Medications Prior to Admission  Medication  Sig Dispense Refill   fosinopril (MONOPRIL) 10 MG tablet Take 10 mg by mouth at bedtime.       iron polysaccharides (NIFEREX) 150 MG capsule Take 150 mg by mouth daily.       levothyroxine (SYNTHROID, LEVOTHROID) 88 MCG tablet Take 88 mcg by mouth daily before breakfast.       metFORMIN (GLUCOPHAGE-XR) 500 MG 24 hr tablet Take 500 mg by mouth at bedtime.       Multiple Vitamins-Minerals (ICAPS AREDS 2 PO) Take by mouth 2 (two) times daily. Reported on 06/23/2015       simvastatin (ZOCOR) 20 MG tablet Take 20 mg by mouth at bedtime.       vitamin B-12 (CYANOCOBALAMIN) 1000 MCG tablet Take 2,000 mcg by mouth daily.       glucose blood test strip USE TO TEST BLOOD SUGAR DAILY. DX CODE: E11.9       HYDROcodone-acetaminophen (NORCO) 5-325 MG tablet Take 1 tablet by mouth every 6 (six) hours as needed for moderate pain. (Patient not taking: No sig reported) 20 tablet 0      Drug Regimen Review  Drug regimen was reviewed and remains appropriate with no significant issues identified   Home: Home Living Family/patient expects to be discharged to:: Private residence Living Arrangements: Children Available Help at Discharge: Family, Available 24 hours/day Type of Home: House Home Access: Ramped entrance Home Layout: One level Bathroom Shower/Tub: Tub/shower unit Home Equipment: Gilmer Mor - single point Additional Comments: [facility] to senior center 2x/wk for exercise.  Lives With: Daughter   Functional History: Prior Function Level of Independence: Independent with assistive device(s) Comments: Pt reports being I with self care tasks, mobility, and IADL tasks. Pt uses SPC for community mobility but not within the home.  Active, enjoys yoga/exercise at senior center 1-2x/week   Functional Status:  Mobility: Bed Mobility Overal bed mobility: Needs Assistance Bed Mobility: Supine to Sit, Sit to Supine Supine to sit: Min assist Sit to supine: Min assist General bed mobility comments: Pt was in  recliner pre/post session Transfers Overall transfer level: Needs assistance Equipment used: None Transfers: Sit to/from Stand, Stand Pivot Transfers Sit to Stand: Mod assist Stand pivot transfers: Mod assist General transfer comment: pt needing assistance to weight shift and mod multimodal cuing for sequencing forward step Ambulation/Gait Ambulation/Gait assistance: Min assist, Mod assist Gait Distance (Feet): 45 Feet Assistive device: Rolling walker (2 wheeled) Gait Pattern/deviations: Step-to pattern, Scissoring, Staggering left, Staggering right, Ataxic General Gait Details: Pt required use of AD today due to severe balance deficits. unsafe to trial without AD. more assistance required to prevent falling. has ~ 5 episodes of scissoring with intervention to  prevent falling. Fatigues extremely quickly. constant vcs throughout for wt shift to allow RLE advancement. does have some ataxic movements. required use of RUE hand splint to maintain grasp on RW. Gait velocity: decreased   ADL: ADL Overall ADL's : Needs assistance/impaired Lower Body Dressing: Moderate assistance Lower Body Dressing Details (indicate cue type and reason): don/doff B socks. Difficulty grasping with R UE and R lateral lean while seated attempting to complete tasks. General ADL Comments: supervision - min A for functional mobility nd self care tasks without use of AD.   Cognition: Cognition Overall Cognitive Status: Within Functional Limits for tasks assessed Orientation Level: Oriented X4 Cognition Arousal/Alertness: Awake/alert Behavior During Therapy: WFL for tasks assessed/performed Overall Cognitive Status: Within Functional Limits for tasks assessed General Comments: Pt with difficulty word finding and slurred speech this session. Pt remains pleasant and cooperative overall.     Blood pressure (!) 143/60, pulse 72, temperature 98.6 F (37 C), resp. rate 17, height 5\' 3"  (1.6 m), weight 55.2 kg, SpO2 98  %. Physical Exam Constitutional:      General: She is not in acute distress.    Appearance: She is not ill-appearing. HENT:    Head: Normocephalic and atraumatic.    Right Ear: External ear normal.    Left Ear: External ear normal.    Nose: Nose normal.    Mouth/Throat:    Mouth: Mucous membranes are moist.    Pharynx: Oropharynx is clear. Eyes:    Extraocular Movements: Extraocular movements intact.    Pupils: Pupils are equal, round, and reactive to light. Cardiovascular:    Rate and Rhythm: Normal rate.    Heart sounds: No murmur heard.   No gallop. Pulmonary:    Effort: Pulmonary effort is normal. No respiratory distress.    Breath sounds: Normal breath sounds. No wheezing. Abdominal:    General: Bowel sounds are normal. There is no distension.    Palpations: Abdomen is soft.    Tenderness: There is no abdominal tenderness. Musculoskeletal:        General: No swelling or tenderness.    Cervical back: Normal range of motion. Skin:    General: Skin is warm.    Comments: Skin tan from sun, a lot of sun related pigmentations  Neurological:    Mental Status: She is alert.    Comments: Alert and oriented x 3. Normal insight and awareness. Intact Memory. Normal language and speech. Cranial nerve exam unremarkable except for mild dysarthria and right central 7. RUE 4/5 delt, 4- biceps, tricep and 3+ wrist and HI. RLE 4/5 prox to distal. LUE and LLE 4-5/5. Has mild sensory loss along tips of digits of right hand (chronic). No other sensory findings. No abnl tone.   Psychiatric:        Mood and Affect: Mood normal.        Behavior: Behavior normal.     Lab Results Last 48 Hours        Results for orders placed or performed during the hospital encounter of 10/19/20 (from the past 48 hour(s))  Glucose, capillary     Status: Abnormal    Collection Time: 10/20/20  8:40 PM  Result Value Ref Range    Glucose-Capillary 120 (H) 70 - 99 mg/dL      Comment: Glucose reference range  applies only to samples taken after fasting for at least 8 hours.  CBC     Status: Abnormal    Collection Time: 10/21/20  4:05 AM  Result Value Ref  Range    WBC 12.3 (H) 4.0 - 10.5 K/uL    RBC 3.68 (L) 3.87 - 5.11 MIL/uL    Hemoglobin 11.6 (L) 12.0 - 15.0 g/dL    HCT 16.1 (L) 09.6 - 46.0 %    MCV 94.3 80.0 - 100.0 fL    MCH 31.5 26.0 - 34.0 pg    MCHC 33.4 30.0 - 36.0 g/dL    RDW 04.5 40.9 - 81.1 %    Platelets 220 150 - 400 K/uL    nRBC 0.0 0.0 - 0.2 %      Comment: Performed at [facility], [address], [place], [place] [address]  Basic metabolic panel     Status: Abnormal    Collection Time: 10/21/20  4:05 AM  Result Value Ref Range    Sodium 138 135 - 145 mmol/L    Potassium 4.7 3.5 - 5.1 mmol/L    Chloride 109 98 - 111 mmol/L    CO2 23 22 - 32 mmol/L    Glucose, Bld 141 (H) 70 - 99 mg/dL      Comment: Glucose reference range applies only to samples taken after fasting for at least 8 hours.    BUN 27 (H) 8 - 23 mg/dL    Creatinine, Ser 2.95 (H) 0.44 - 1.00 mg/dL    Calcium 9.0 8.9 - 62.1 mg/dL    GFR, Estimated 33 (L) >60 mL/min      Comment: (NOTE) Calculated using the CKD-EPI Creatinine Equation (2021)      Anion gap 6 5 - 15      Comment: Performed at [facility], [address] [place], [place] [address]  Glucose, capillary     Status: Abnormal    Collection Time: 10/21/20  7:46 AM  Result Value Ref Range    Glucose-Capillary 128 (H) 70 - 99 mg/dL      Comment: Glucose reference range applies only to samples taken after fasting for at least 8 hours.  Glucose, capillary     Status: Abnormal    Collection Time: 10/21/20  8:30 PM  Result Value Ref Range    Glucose-Capillary 283 (H) 70 - 99 mg/dL      Comment: Glucose reference range applies only to samples taken after fasting for at least 8 hours.  Basic metabolic panel     Status: Abnormal    Collection Time: 10/22/20  4:27 AM  Result Value Ref Range    Sodium 139 135 - 145 mmol/L     Potassium 4.3 3.5 - 5.1 mmol/L    Chloride 108 98 - 111 mmol/L    CO2 22 22 - 32 mmol/L    Glucose, Bld 125 (H) 70 - 99 mg/dL      Comment: Glucose reference range applies only to samples taken after fasting for at least 8 hours.    BUN 31 (H) 8 - 23 mg/dL    Creatinine, Ser 7.84 (H) 0.44 - 1.00 mg/dL    Calcium 9.1 8.9 - 69.6 mg/dL    GFR, Estimated 31 (L) >60 mL/min      Comment: (NOTE) Calculated using the CKD-EPI Creatinine Equation (2021)      Anion gap 9 5 - 15      Comment: Performed at [facility], [address] [place], [place] [address]  Glucose, capillary     Status: Abnormal    Collection Time: 10/22/20  8:37 AM  Result Value Ref Range    Glucose-Capillary 144 (H) 70 -  99 mg/dL      Comment: Glucose reference range applies only to samples taken after fasting for at least 8 hours.       Imaging Results (Last 48 hours)  MR BRAIN WO CONTRAST   Result Date: 10/21/2020 CLINICAL DATA:  Stroke follow-up. EXAM: MRI HEAD WITHOUT CONTRAST TECHNIQUE: Multiplanar, multiecho pulse sequences of the brain and surrounding structures were obtained without intravenous contrast. COMPARISON:  MRI of the brain October 20, 2020. FINDINGS: Brain: Interval progression of area of restricted diffusion involving the left ventrolateral thalamus, measuring approximately 15 mm, consistent with acute infarct. No hemorrhage, hydrocephalus, extra-axial collection or mass lesion. Remote lacunar infarct in the right thalamus. Small amount of scattered foci of T2 hyperintensity are seen within the white matter of the cerebral hemispheres and within the pons, nonspecific, most likely related to chronic small vessel ischemia. Mild parenchymal volume loss. Vascular: Absent flow void in the V3 and V4 segments of the right vertebral artery may be related to slow flow versus occlusion. Skull and upper cervical spine: Normal marrow signal. Sinuses/Orbits: Bilateral lens surgery. Paranasal sinuses are clear.  Other: None. IMPRESSION: Significant interval progression of the area of restricted diffusion within the left ventrolateral thalamus consistent with acute infarct, now measuring 15 mm. Electronically Signed   By: Baldemar Lenis M.D.   On: 10/21/2020 15:32   US Carotid Bilateral   Result Date: 10/21/2020 CLINICAL DATA:  87 year old female with a history of stroke EXAM: BILATERAL CAROTID DUPLEX ULTRASOUND TECHNIQUE: Wallace Cullens scale imaging, color Doppler and duplex ultrasound were performed of bilateral carotid and vertebral arteries in the neck. COMPARISON:  None. FINDINGS: Criteria: Quantification of carotid stenosis is based on velocity parameters that correlate the residual internal carotid diameter with NASCET-based stenosis levels, using the diameter of the distal internal carotid lumen as the denominator for stenosis measurement. The following velocity measurements were obtained: RIGHT ICA:  Systolic 82 cm/sec, Diastolic 19 cm/sec CCA:  60 cm/sec SYSTOLIC ICA/CCA RATIO:  1.4 ECA:  225 cm/sec LEFT ICA:  Systolic 106 cm/sec, Diastolic 19 cm/sec CCA:  75 cm/sec SYSTOLIC ICA/CCA RATIO:  1.4 ECA:  192 cm/sec Right Brachial SBP: Not acquired Left Brachial SBP: Not acquired RIGHT CAROTID ARTERY: No significant calcifications of the right common carotid artery. Intermediate waveform maintained. Moderate heterogeneous and partially calcified plaque at the right carotid bifurcation. No significant lumen shadowing. Low resistance waveform of the right ICA. No significant tortuosity. RIGHT VERTEBRAL ARTERY: Antegrade flow with low resistance waveform. LEFT CAROTID ARTERY: No significant calcifications of the left common carotid artery. Intermediate waveform maintained. Moderate heterogeneous and partially calcified plaque at the left carotid bifurcation. No significant lumen shadowing. Low resistance waveform of the left ICA. No significant tortuosity. LEFT VERTEBRAL ARTERY:  Antegrade flow with low  resistance waveform. IMPRESSION: Color duplex indicates moderate heterogeneous and calcified plaque, with no hemodynamically significant stenosis by duplex criteria in the extracranial cerebrovascular circulation. Signed, Yvone Neu. Reyne Dumas, RPVI [facility] Electronically Signed   By: Gilmer Mor D.O.   On: 10/21/2020 16:03   ECHOCARDIOGRAM COMPLETE BUBBLE STUDY   Result Date: 10/20/2020    ECHOCARDIOGRAM REPORT   Patient Name:   [facility] Date of Exam: 10/20/2020 Medical Rec #:  [id number]       Height:       63.0 in Accession #:    [id number]      Weight:       129.4 lb Date of Birth:  March 02, 1929  BSA:          1.607 m Patient Age:    89 years        BP:           168/62 mmHg Patient Gender: F               HR:           83 bpm. Exam Location:  [facility] Procedure: 2D Echo, Cardiac Doppler, Color Doppler and Saline Contrast Bubble            Study Indications:     TIA 434.9 / G45.9  History:         Patient has no prior history of Echocardiogram examinations.                  Risk Factors:Hypertension and Diabetes.  Sonographer:     Cristela Blue RDCS (AE) Referring Phys:  [id number] Angie Fava Diagnosing Phys: Lorine Bears MD  Sonographer Comments: Suboptimal parasternal window. IMPRESSIONS  1. Left ventricular ejection fraction, by estimation, is 55 to 60%. The left ventricle has normal function. Left ventricular endocardial border not optimally defined to evaluate regional wall motion. There is mild left ventricular hypertrophy. Left ventricular diastolic parameters are indeterminate.  2. Right ventricular systolic function is normal. The right ventricular size is normal. There is mildly elevated pulmonary artery systolic pressure.  3. The mitral valve is normal in structure. Mild mitral valve regurgitation. No evidence of mitral stenosis. Moderate mitral annular calcification.  4. The aortic valve is normal in structure. Aortic valve  regurgitation is not visualized. Mild to moderate aortic valve sclerosis/calcification is present, without any evidence of aortic stenosis.  5. The inferior vena cava is normal in size with greater than 50% respiratory variability, suggesting right atrial pressure of 3 mmHg.  6. Agitated saline contrast bubble study was negative, with no evidence of any interatrial shunt. FINDINGS  Left Ventricle: Left ventricular ejection fraction, by estimation, is 55 to 60%. The left ventricle has normal function. Left ventricular endocardial border not optimally defined to evaluate regional wall motion. The left ventricular internal cavity size was normal in size. There is mild left ventricular hypertrophy. Left ventricular diastolic parameters are indeterminate. Right Ventricle: The right ventricular size is normal. No increase in right ventricular wall thickness. Right ventricular systolic function is normal. There is mildly elevated pulmonary artery systolic pressure. The tricuspid regurgitant velocity is 3.18  m/s, and with an assumed right atrial pressure of 3 mmHg, the estimated right ventricular systolic pressure is 43.4 mmHg. Left Atrium: Left atrial size was normal in size. Right Atrium: Right atrial size was normal in size. Pericardium: There is no evidence of pericardial effusion. Mitral Valve: The mitral valve is normal in structure. Moderate mitral annular calcification. Mild mitral valve regurgitation. No evidence of mitral valve stenosis. Tricuspid Valve: The tricuspid valve is normal in structure. Tricuspid valve regurgitation is trivial. No evidence of tricuspid stenosis. Aortic Valve: The aortic valve is normal in structure. Aortic valve regurgitation is not visualized. Mild to moderate aortic valve sclerosis/calcification is present, without any evidence of aortic stenosis. Aortic valve mean gradient measures 4.5 mmHg. Aortic valve peak gradient measures 9.4 mmHg. Aortic valve area, by VTI measures 2.66 cm.  Pulmonic Valve: The pulmonic valve was normal in structure. Pulmonic valve regurgitation is not visualized. No evidence of pulmonic stenosis. Aorta: The aortic root is normal in size and structure. Venous: The inferior vena cava is normal in size with greater than  50% respiratory variability, suggesting right atrial pressure of 3 mmHg. IAS/Shunts: No atrial level shunt detected by color flow Doppler. Agitated saline contrast was given intravenously to evaluate for intracardiac shunting. Agitated saline contrast bubble study was negative, with no evidence of any interatrial shunt.  LEFT VENTRICLE PLAX 2D LVIDd:         2.75 cm  Diastology LVIDs:         2.02 cm  LV e' medial:    6.31 cm/s LV PW:         1.18 cm  LV E/e' medial:  18.5 LV IVS:        0.90 cm  LV e' lateral:   6.85 cm/s LVOT diam:     2.00 cm  LV E/e' lateral: 17.1 LV SV:         85 LV SV Index:   53 LVOT Area:     3.14 cm  RIGHT VENTRICLE RV Basal diam:  2.97 cm RV S prime:     16.90 cm/s TAPSE (M-mode): 3.2 cm LEFT ATRIUM           Index       RIGHT ATRIUM          Index LA diam:      2.60 cm 1.62 cm/m  RA Area:     7.99 cm LA Vol (A2C): 24.4 ml 15.18 ml/m RA Volume:   13.80 ml 8.59 ml/m LA Vol (A4C): 26.3 ml 16.37 ml/m  AORTIC VALVE                    PULMONIC VALVE AV Area (Vmax):    2.42 cm     PV Vmax:        0.37 m/s AV Area (Vmean):   2.12 cm     PV Peak grad:   0.5 mmHg AV Area (VTI):     2.66 cm     RVOT Peak grad: 1 mmHg AV Vmax:           153.00 cm/s AV Vmean:          101.250 cm/s AV VTI:            0.320 m AV Peak Grad:      9.4 mmHg AV Mean Grad:      4.5 mmHg LVOT Vmax:         118.00 cm/s LVOT Vmean:        68.400 cm/s LVOT VTI:          0.271 m LVOT/AV VTI ratio: 0.85  AORTA Ao Root diam: 2.77 cm MITRAL VALVE                TRICUSPID VALVE MV Area (PHT): 3.60 cm     TR Peak grad:   40.4 mmHg MV Decel Time: 211 msec     TR Vmax:        318.00 cm/s MV E velocity: 117.00 cm/s MV A velocity: 138.00 cm/s  SHUNTS MV E/A ratio:   0.85         Systemic VTI:  0.27 m                             Systemic Diam: 2.00 cm Lorine Bears MD Electronically signed by Lorine Bears MD Signature Date/Time: 10/20/2020/1:44:50 PM    Final              Medical Problem List and Plan: 1.  Functional and mobility deficits secondary to left basal ganglia infarct             -patient may shower             -ELOS/Goals: 7-10 days, mod I goals with PT, OT, SLP 2.  Antithrombotics: -DVT/anticoagulation:  Pharmaceutical: Lovenox             -antiplatelet therapy: ASA and plavix 3. Pain Management: Tylenol prn 4. Mood: LCSW to follow for evaluation and support             -antipsychotic agents: N/AA 5. Neuropsych: This patient is capable of making decisions on her own behalf. 6. Skin/Wound Care: Routine pressure-relief measures 7. Fluids/Electrolytes/Nutrition: Monitor intake/output.   --Check CMet on 07/04 -pt reports good appetite. Does struggle to drink a lot at baseline 8.  HTN: Monitor blood pressures 3 times daily.  Avoid hypotension. 9.  T2DM: Resume metformin.  Monitor blood sugars ac/hs             --Use SSI for elevated blood sugar and titrate medication as indicated. 10. L- CAS: Continue DAPT--Plavix to stop 5 days prior to procedure.             -- Follow-up with Dr. Wyn Quaker for surgery on outpatient basis. 11.  CKD: Baseline SCR 1.4-1.7 range             -- Monitor renal status with routine checks.             Jacquelynn Cree, PA-C 10/22/2020   I have personally performed a face to face diagnostic evaluation of this patient and formulated the key components of the plan.  Additionally, I have personally reviewed laboratory data, imaging studies, as well as relevant notes and concur with the physician assistant's documentation above.  The patient's status has not changed from the original H&P.  Any changes in documentation from the acute care chart have been noted above.  Ranelle Oyster, MD, [place]

## 2020-10-24 NOTE — TOC Transition Note (Signed)
Transition of Care [facility]) - CM/SW Discharge Note   Patient Details  Name: Cassandra Schaefer MRN: [id number] Date of Birth: 02-04-1929  Transition of Care [facility]) CM/SW Contact:  Allayne Butcher, RN Phone Number: 10/24/2020, 10:07 AM   Clinical Narrative:    Patient medically cleared for discharge and [name] has insurance authorization.  Patient will admit to [name] today.  [name] is arranging [facility] transport for 1130.     Final next level of care: IP Rehab Facility Barriers to Discharge: Barriers Resolved   Patient Goals and CMS Choice Patient states their goals for this hospitalization and ongoing recovery are:: Patient and family agree to go to [facility] CMS Medicare.gov Compare Post Acute Care list provided to:: Patient Choice offered to / list presented to : Patient  Discharge Placement              Patient chooses bed at:  ([name]) Patient to be transferred to facility by: [facility] Name of family member notified: [name] Patient and family notified of of transfer: 10/24/20  Discharge Plan and Services   Discharge Planning Services: CM Consult Post Acute Care Choice: IP Rehab          DME Arranged: N/A DME Agency: NA       HH Arranged: NA          Social Determinants of Health (SDOH) Interventions     Readmission Risk Interventions No flowsheet data found.

## 2020-10-24 NOTE — Progress Notes (Signed)
Ranelle Oyster, MD   Physician  Physical Medicine and Rehabilitation  PMR Pre-admission      Addendum  Date of Service:  10/24/2020 10:09 AM       Related encounter: ED to Hosp-Admission (Discharged) from 10/19/2020 in [facility] (1C)           Show:Clear all [x] Written[x] Templated[x] Copied  Added by: [x] , RN   [] Hover for details                                                                                                                                                                                                     PMR Admission Coordinator Pre-Admission Assessment   Patient: Cassandra Schaefer is an 87 y.o., female MRN: DOB: 1928-08-01 Height: 5\' 3"  (160 cm) Weight: 60.7 kg                                                                                                                                      Insurance Information HMO:     PPO:      PCP:      IPA:      80/20:      OTHER: PRIMARY: [employer] Medicare      Policy#: [id number]      Subscriber: pt CM Name: [id number]      Phone#: [phone]     Fax#: Pre-Cert#: [id number] approved for 7 days with f/u Herbert Seta phone [phone] fax [phone]      Employer: Benefits:  Phone #: online     Name: Eff. Date: 04/26/2020     Deduct: none      Out of Pocket Max: $1000      Life Max: none  [name] $150 co pay per admit    SNF: 100% Outpatient: $20 per visit    Co-Pay: visits per medical neccesity Home Health: 100%      Co-Pay: visits per medical neccesity DME: 100%     Co-Pay: none Providers: in  network  SECONDARY: none      Policy#:       Phone#:   Artist:       Phone#:    The Data processing manager" for patients in Inpatient Rehabilitation Facilities with attached "Privacy Act Statement-Health Care Records" was provided and verbally reviewed with: Family    Emergency Contact Information Contact Information       Name Relation Home Work Mobile    Westley Hummer Daughter [phone]   [phone]         Current Medical History  Patient Admitting Diagnosis CVA   History of Present Illness: 87 year old female with past medical history of HTN, type 2 diabetes, CKD stage III who presented on 6/26 with right sided weakness, gait dysfunction and speech dysfunction . MRI showed left basal ganglia small ischemic CVA, no hemorrhage. Neurology consulted. Noted worsening neurologic symptoms on 6/28 and repeat MRI showed stroke enlarged from 5 mm to 15 mm. CTA with carotid bulb plaque and vertebral artery plaque w/ severe stenosis. SLP passed swallow eval. TTE unremarkable. Occasional tachycardia on tel but no afib/flutter. Vascular surgery consulted and plan either CEA or stenting after rehab. Will need to stop PLAVIX 5 days prior to procedure. Continue ASA, Plavix and high intensity Lipitor for now. Cardiology follow up for consideration of Ziopatch monitor. Hgb A1c 7.0 was on metformin, held due to renal function. Covered with sensitive sliding scale Novolog. Continue Levothyroxine for hypothyroidism.Marland Kitchen Allowed permissive HTN initially. Neurology ok resuming home antihypertensives of Monopril .    Complete NIHSS TOTAL: 2   Past Medical History      Past Medical History:  Diagnosis Date   Anemia     Cough      RESOLVING, NO FEVER   Diabetes mellitus without complication (HCC)     Gout     Hypertension     Hypothyroidism     PUD (peptic ulcer disease)     Wears dentures      full upper, partial lower      Family History  family history is not on file.   Prior Rehab/Hospitalizations:  Has the patient had prior rehab or hospitalizations prior to admission? Yes   Has the patient had major surgery during 100 days prior to admission? No   Current Medications    Current Facility-Administered Medications:   acetaminophen (TYLENOL) tablet 650  mg, 650 mg, Oral, Q6H PRN **OR** acetaminophen (TYLENOL) suppository 650 mg, 650 mg, Rectal, Q6H PRN, Howerter, Justin B, DO   aspirin chewable tablet 81 mg, 81 mg, Oral, Daily, Howerter, Justin B, DO, 81 mg at 10/24/20 0936   atorvastatin (LIPITOR) tablet 80 mg, 80 mg, Oral, Daily, Wouk, Wilfred Curtis, MD, 80 mg at 10/24/20 0936   clopidogrel (PLAVIX) tablet 75 mg, 75 mg, Oral, Daily, Caryl Pina, MD, 75 mg at 10/24/20 0936   hydrALAZINE (APRESOLINE) injection 10 mg, 10 mg, Intravenous, Q4H PRN, Howerter, Justin B, DO   insulin aspart (novoLOG) injection 0-5 Units, 0-5 Units, Subcutaneous, QHS, Howerter, Justin B, DO, 3 Units at 10/21/20 2047   insulin aspart (novoLOG) injection 0-9 Units, 0-9 Units, Subcutaneous, TID WC, Esaw Grandchild A, DO   levothyroxine (SYNTHROID) tablet 88 mcg, 88 mcg, Oral, Q0600, Howerter, Justin B, DO, 88 mcg at 10/24/20 0609   Patients Current Diet:  Diet Order                  Diet - low sodium heart healthy  Diet Carb Modified Fluid consistency: Thin; Room service appropriate? Yes  Diet effective now                       Precautions / Restrictions Precautions Precautions: Fall Restrictions Weight Bearing Restrictions: No    Has the patient had 2 or more falls or a fall with injury in the past year?No   Prior Activity Level Community (5-7x/wk): Independent, drivng and very active   Prior Functional Level Prior Function Level of Independence: Independent with assistive device(s) Comments: Pt reports being I with self care tasks, mobility, and IADL tasks. Pt uses SPC for community mobility but not within the home.  Active, enjoys yoga/exercise at senior center 1-2x/week   Self Care: Did the patient need help bathing, dressing, using the toilet or eating?  Independent   Indoor Mobility: Did the patient need assistance with walking from room to room (with or without device)? Independent   Stairs: Did the patient need assistance with  internal or external stairs (with or without device)? Independent   Functional Cognition: Did the patient need help planning regular tasks such as shopping or remembering to take medications? Independent   Home Assistive Devices / Equipment Home Assistive Devices/Equipment: None Home Equipment: Cane - single point   Prior Device Use: Indicate devices/aids used by the patient prior to current illness, exacerbation or injury?  Cane in community only   Current Functional Level Cognition   Overall Cognitive Status: Within Functional Limits for tasks assessed Orientation Level: Oriented X4 General Comments: Pt is A and O and pleasant/motivated    Extremity Assessment (includes Sensation/Coordination)   Upper Extremity Assessment:  (R UE grossly 4-/5; mild paresthesia fingertips of bilat hands (baseline neuropathy), unchanged with this admission) RUE Deficits / Details: 3+/5 gross strength RUE Sensation: history of peripheral neuropathy RUE Coordination: decreased fine motor, decreased gross motor  Lower Extremity Assessment:  (R LE grossly 4/5, denies numbness/tingling)     ADLs   Overall ADL's : Needs assistance/impaired Lower Body Dressing: Moderate assistance Lower Body Dressing Details (indicate cue type and reason): don/doff B socks. Difficulty grasping with R UE and R lateral lean while seated attempting to complete tasks. General ADL Comments: supervision - min A for functional mobility nd self care tasks without use of AD.     Mobility   Overal bed mobility: Needs Assistance Bed Mobility: Supine to Sit, Sit to Supine Supine to sit: Min assist Sit to supine: Min assist General bed mobility comments: Pt seated in recliner chair     Transfers   Overall transfer level: Needs assistance Equipment used: Rolling walker (2 wheeled) Transfers: Sit to/from Stand Sit to Stand: Min assist Stand pivot transfers: Mod assist General transfer comment: Min assist for safety with  assistance placing RUE in RW splint     Ambulation / Gait / Stairs / Wheelchair Mobility   Ambulation/Gait Ambulation/Gait assistance: Editor, commissioning (Feet): 75 Feet Assistive device: Rolling walker (2 wheeled) Gait Pattern/deviations: Step-to pattern, Ataxic, Decreased weight shift to left, Decreased stride length, Decreased step length - right General Gait Details: Pt ambulated with more of a step to pattern versus step through. Poor L lateral wt shift to allow RLE advancement. Needs min assist to wt shift. very slow gait cadance Gait velocity: decreased     Posture / Balance Dynamic Sitting Balance Sitting balance - Comments: no LOB in sitting Balance Overall balance assessment: Needs assistance Sitting-balance support: Feet supported Sitting balance-Leahy Scale: Good Sitting  balance - Comments: no LOB in sitting Postural control: Right lateral lean Standing balance support: Bilateral upper extremity supported, During functional activity Standing balance-Leahy Scale: Poor Standing balance comment: high fall risk/R Lateral lean Standardized Balance Assessment Standardized Balance Assessment : Berg Balance Test Berg Balance Test Sit to Stand: Able to stand using hands after several tries Standing Unsupported: Needs several tries to stand 30 seconds unsupported Sitting with Back Unsupported but Feet Supported on Floor or Stool: Able to sit 2 minutes under supervision Stand to Sit: Controls descent by using hands Transfers: Needs one person to assist Standing Unsupported with Eyes Closed: Needs help to keep from falling Standing Ubsupported with Feet Together: Needs help to attain position and unable to hold for 15 seconds From Standing, Reach Forward with Outstretched Arm: Reaches forward but needs supervision From Standing Position, Pick up Object from Floor: Unable to pick up and needs supervision From Standing Position, Turn to Look Behind Over each Shoulder: Needs  supervision when turning Turn 360 Degrees: Needs assistance while turning Standing Unsupported, Alternately Place Feet on Step/Stool: Needs assistance to keep from falling or unable to try Standing Unsupported, One Foot in Front: Needs help to step but can hold 15 seconds Standing on One Leg: Unable to try or needs assist to prevent fall Total Score: 14     Special needs/care consideration Hgb A1c 7 Will need either CEA or stent after rehab        Previous Home Environment  Living Arrangements:  (daughter , Aurther Lofterry)  Lives With: Daughter Available Help at Discharge: Family, Available 24 hours/day Type of Home: House Home Layout: One level Home Access: Ramped entrance Bathroom Shower/Tub: Engineer, manufacturing systemsTub/shower unit Bathroom Toilet: Standard Bathroom Accessibility: Yes How Accessible: Accessible via walker Home Care Services: No Additional Comments: Pt goes to senior center 2x/wk for exercise.   Discharge Living Setting Plans for Discharge Living Setting: Lives with (comment) (daughter) Type of Home at Discharge: House Discharge Home Layout: One level Discharge Home Access: Ramped entrance Discharge Bathroom Shower/Tub: Tub/shower unit Discharge Bathroom Toilet: Standard Discharge Bathroom Accessibility: Yes How Accessible: Accessible via walker Does the patient have any problems obtaining your medications?: No   Social/Family/Support Systems Contact Information: Aurther Lofterry, daughter Anticipated Caregiver: daughter and family Anticipated Caregiver's Contact Information: [phone] Ability/Limitations of Caregiver: Aurther Lofterry, Charity fundraiserN at [facility] but will arrnage 24/7 as needed Caregiver Availability: 24/7 Discharge Plan Discussed with Primary Caregiver: Yes Is Caregiver In Agreement with Plan?: Yes Does Caregiver/Family have Issues with Lodging/Transportation while Pt is in Rehab?: No   Goals Patient/Family Goal for Rehab: Mod I to supervision wiht PT, OT and SLP Expected length of stay: ELOS 7 to  10 days Additional Information: will need CEA or stenting after rehab stay Pt/Family Agrees to Admission and willing to participate: Yes Program Orientation Provided & Reviewed with Pt/Caregiver Including Roles  & Responsibilities: Yes   Decrease burden of Care through IP rehab admission: n/a     Possible need for SNF placement upon discharge:not anticipated   Patient Condition: This patient's medical and functional status has changed since the consult dated: 10/21/2020 in which the Rehabilitation Physician determined and documented that the patient's condition is appropriate for intensive rehabilitative care in an inpatient rehabilitation facility. See "History of Present Illness" (above) for medical update. Functional changes are: min assist. Patient's medical and functional status update has been discussed with the Rehabilitation physician and patient remains appropriate for inpatient rehabilitation. Will admit to inpatient rehab today.   Preadmission Screen Completed By:  Beckie SaltsBoyette,  Foye Spurling, RN, 10/24/2020 10:09 AM ______________________________________________________________________   Discussed status with Dr. Riley Kill on 10/24/2020 at  1030 and received approval for admission today.   Admission Coordinator:  Clois Dupes, time 7322 /Date 10/24/2020         Revision History                             Note Details  Author Ranelle Oyster, MD File Time 10/24/2020 10:37 AM  Author Type Physician Status Addendum  Last Editor Ranelle Oyster, MD Service Physical Medicine and Rehabilitation  [facility] Acct # [id number] Admit Date 10/24/2020

## 2020-10-24 NOTE — Discharge Summary (Addendum)
Physician Discharge Summary  Cassandra Schaefer ZOX:[id number] DOB: 05/12/1928 DOA: 10/19/2020  PCP: Barbette Reichmann, MD  Admit date: 10/19/2020 Discharge date: 10/24/2020  Admitted From: home Disposition:  [name]  Recommendations for Outpatient Follow-up:  Follow up with PCP in 1-2 weeks Please obtain BMP/CBC in one week Please follow up with Dr. Wyn Quaker, vascular surgery, after rehab d/c for treatment of carotid artery stenosis.   PLAVIX WILL NEED TO BE STOPPED 5 DAYS PRIOR TO DATE OF PROCEDURE Blood glucose - elevated in the hospital, covered with sliding scale Novolog which is prescribed at d/c.  Patient and family declined it for concerns patient will get hypoglycemic.    Home Health: no  Equipment/Devices: none   Discharge Condition: stable  CODE STATUS: full  Diet recommendation: Heart Healthy / Carb Modified     Discharge Diagnoses: Principal Problem:   TIA (transient ischemic attack) Active Problems:   Hyperlipidemia, unspecified   Hypertension   Hypothyroidism, acquired   Type 2 diabetes mellitus (HCC)   Right hemiparesis (HCC)   Ischemic stroke (HCC)   PAC (premature atrial contraction)   Pre-procedural cardiovascular examination    Summary of HPI and Hospital Course:  87 yo female with past history of hypertension, type 2 diabetes, CKD stage III who presented with right sided weakness, gait dysfunction, and speech dysfunction beginning 12:30 on 6/26. MRI showed left basal ganglia small ischemic stroke, no hemorrhage.  Admitted to [facility] service with neurology consulted for further evaluation.  Patient noted to have worsening neurologic symptoms on 6/28 and had repeat MRI which showed stroke enlarged from 5 mm to 15 mm.    Physical and occupational therapy recommending [name] for rehab.  Patient previously fully independent with ambulation and ADLs.     Ischemic CVA of the left basal ganglia Carotid/vertebral artery stenosis - 70% or greater Left ICA stenosis No hemorrhagic  transformation. Symptoms are mild -right-sided weakness, and dysarthria (improving). Received asa 325 at home. CTA with carotid bulb plaque and vertebral artery plaque w/ severe stenosis. SLP: passed swallow eval. TTE unremarkable. Occasional tachycardia on tele but no A-fib/flutter on telemetry. --Neurology was consulted --Vascular surgery consulted -- plans for carotid endarterectomy in about 1-2 weeks STOP PLAVIX 5 DAYS BEFORE PROCEDURE --Continue aspirin, Plavix and high intensity Lipitor --Cardiology follow up for consideration of Ziopatch monitor --Patient stable today for d/c to [name] for rehab   Sinus tachycardia with PAC's - noted 6/29.  Resolved. Cardiology consulted at family's request. Telemetry reviewed and no A-fib/flutter seen. Echo with preserved EF.   Noninsulin-dependent type 2 diabetes with Hyperglycemia - A1c 7.0%. Was on metformin - held stopped at d/c due to renal function.   Patient had blood sugars in 200-300's during admission. Covered with sensitive sliding scale NovoLog - continue for now at rehab & discharge. Recommend close primary care follow up. Monitor closely if insulin is given; family report sensitivity to insulin in the past Follow hypoglycemic protocol.   Hypothyroidism -continue levothyroxine   Essential hypertension -initially allowed for permissive HTN in setting of acute stroke; Neurology ok with resuming home antihypertensives (Monopril 10 mg), resumed at d/c with BP's running mildly high.   Monitor BP.     Leukocytosis - suspect reactive in setting of stroke.   No evidence of infection, afebrile.  No antibiotic warranted at this time. Monitor clinically.       Discharge Instructions   Discharge Instructions     Call MD for:   Complete by: As directed    New or  worsening stroke-like symptoms (weakness on one side, speech or swallowing trouble, numbness/tingling etc)   Call MD for:  extreme fatigue   Complete by: As directed     Call MD for:  persistant dizziness or light-headedness   Complete by: As directed    Call MD for:  persistant nausea and vomiting   Complete by: As directed    Call MD for:  severe uncontrolled pain   Complete by: As directed    Call MD for:  temperature >100.4   Complete by: As directed    Diet - low sodium heart healthy   Complete by: As directed    Discharge instructions   Complete by: As directed    Your blood sugars have been running high in the hospital. I have stopped your metformin for now (due to kidney function). For now, will discharge on Sliding Scale Novolog insulin to control your blood sugars. Please be sure to schedule close follow up with your primary care provider.  Follow up with Dr. Wyn Quakerew of vascular surgery.  Please contact their office on Monday in you do not already have a scheduled date and time.   Increase activity slowly   Complete by: As directed       Allergies as of 10/24/2020       Reactions   Penicillins Hives   Sulfa Antibiotics Hives   Tetanus Toxoids Swelling   Procaine Other (See Comments)   "went into shock"   Tuberculin Tests Swelling, Rash        Medication List     STOP taking these medications    HYDROcodone-acetaminophen 5-325 MG tablet Commonly known as: Norco   metFORMIN 500 MG 24 hr tablet Commonly known as: GLUCOPHAGE-XR   simvastatin 20 MG tablet Commonly known as: ZOCOR       TAKE these medications    aspirin 81 MG chewable tablet Chew 1 tablet (81 mg total) by mouth daily. Start taking on: October 25, 2020   atorvastatin 80 MG tablet Commonly known as: LIPITOR Take 1 tablet (80 mg total) by mouth daily. Start taking on: October 25, 2020   clopidogrel 75 MG tablet Commonly known as: PLAVIX Take 1 tablet (75 mg total) by mouth daily. Start taking on: October 25, 2020   fosinopril 10 MG tablet Commonly known as: MONOPRIL Take 10 mg by mouth at bedtime.   glucose blood test strip USE TO TEST BLOOD SUGAR DAILY. DX  CODE: E11.9   ICAPS AREDS 2 PO Take by mouth 2 (two) times daily. Reported on 06/23/2015   insulin aspart 100 UNIT/ML injection Commonly known as: novoLOG Inject 0-9 Units into the skin 3 (three) times daily with meals. Take 0-9 units 3 times daily with meals as follows: If CBG 70-120: 0 units If CBG 121-150: 1 unit If CBG 151-200: 2 units If CBG 201-250: 3 units If CBG 251-300: 5 units If CBG 301-350: 7 units If CBG 351-400: 9 units If CBG over 400: 12 units and call MD   iron polysaccharides 150 MG capsule Commonly known as: NIFEREX Take 150 mg by mouth daily.   levothyroxine 88 MCG tablet Commonly known as: SYNTHROID Take 88 mcg by mouth daily before breakfast.   vitamin B-12 1000 MCG tablet Commonly known as: CYANOCOBALAMIN Take 2,000 mcg by mouth daily.        Allergies  Allergen Reactions   Penicillins Hives   Sulfa Antibiotics Hives   Tetanus Toxoids Swelling   Procaine Other (See Comments)    "  went into shock"   Tuberculin Tests Swelling and Rash     If you experience worsening of your admission symptoms, develop shortness of breath, life threatening emergency, suicidal or homicidal thoughts you must seek medical attention immediately by calling 911 or calling your MD immediately  if symptoms less severe.    Please note   You were cared for by a hospitalist during your hospital stay. If you have any questions about your discharge medications or the care you received while you were in the hospital after you are discharged, you can call the unit and asked to speak with the hospitalist on call if the hospitalist that took care of you is not available. Once you are discharged, your primary care physician will handle any further medical issues. Please note that NO REFILLS for any discharge medications will be authorized once you are discharged, as it is imperative that you return to your primary care physician (or establish a relationship with a primary care  physician if you do not have one) for your aftercare needs so that they can reassess your need for medications and monitor your lab values.   Consultations: Neurology Vascular surgery   Procedures/Studies: CT ANGIO HEAD NECK W WO CM  Result Date: 10/19/2020 CLINICAL DATA:  Initial evaluation for acute right-sided weakness, speech difficulty. EXAM: CT ANGIOGRAPHY HEAD AND NECK TECHNIQUE: Multidetector CT imaging of the head and neck was performed using the standard protocol during bolus administration of intravenous contrast. Multiplanar CT image reconstructions and MIPs were obtained to evaluate the vascular anatomy. Carotid stenosis measurements (when applicable) are obtained utilizing NASCET criteria, using the distal internal carotid diameter as the denominator. CONTRAST:  75mL OMNIPAQUE IOHEXOL 350 MG/ML SOLN COMPARISON:  None. FINDINGS: CT HEAD FINDINGS Brain: Generalized age-related cerebral atrophy. Few small remote lacunar infarcts noted about the right basal ganglia and right thalamus. No acute intracranial hemorrhage. No acute large vessel territory infarct. No mass lesion, midline shift or mass effect. No hydrocephalus or extra-axial fluid collection. Vascular: No hyperdense vessel. Scattered vascular calcifications noted within the carotid siphons. Skull: Scalp soft tissues demonstrate no acute finding. Calvarium intact. Sinuses: Changes of chronic sinusitis noted. No active disease. No mastoid effusion. Orbits:  Globes orbital soft tissues demonstrate no acute finding. CTA NECK FINDINGS Aortic arch: Visualized aortic arch normal caliber with normal 3 vessel morphology. Moderate atheromatous change about the arch and origin of the great vessels without high-grade stenosis. Right carotid system: Right CCA patent from its origin to the bifurcation without stenosis. Eccentric mixed plaque at the right carotid bulb/proximal right ICA with associated mild 35% stenosis by NASCET criteria. Right ICA  patent distally without stenosis, dissection or occlusion. Left carotid system: The CCA patent from its origin to the bifurcation without stenosis. Bulky calcified plaque about the left carotid bulb with associated stenosis of up to 70% by NASCET criteria. Left ICA patent distally without stenosis, dissection or occlusion. Vertebral arteries: Both vertebral arteries arise from subclavian arteries. No significant proximal subclavian artery stenosis. Strongly dominant left vertebral artery with a diffusely hypoplastic right vertebral artery. Atheromatous plaque at the origin of the dominant left vertebral artery with associated severe stenosis (series 10, image 77). Dominant left vertebral artery otherwise patent within the neck. Focal severe distal right V3 stenosis noted just prior to cranial vault (series 10, image 196). Skeleton: No visible acute osseous finding. No discrete or worrisome osseous lesions. Other neck: No other acute soft tissue abnormality within the neck. No mass or adenopathy. Upper chest:  Scattered interlobular septal thickening noted within the visualized lungs, suggesting mild pulmonary interstitial edema. Mildly prominent precarinal node measures up to the upper limits of normal at 1 cm. Visualized upper chest demonstrates no other acute finding. Review of the MIP images confirms the above findings CTA HEAD FINDINGS Anterior circulation: Petrous segments patent bilaterally. Scattered atheromatous change within the carotid siphons without high-grade stenosis. A1 segments patent bilaterally. Normal anterior communicating artery complex. Anterior cerebral arteries patent to their distal aspects without stenosis. No M1 stenosis or occlusion. Normal MCA bifurcations. Distal MCA branches well perfused and symmetric. Posterior circulation: Dominant left vertebral artery widely patent to the vertebrobasilar junction. Right vertebral artery markedly hypoplastic and irregular, but is grossly patent to  the vertebrobasilar junction as well. Right PICA origin patent. Left PICA not seen. Basilar patent to its distal aspect without stenosis. Superior cerebral arteries patent bilaterally. Left PCA supplied via the basilar and is widely patent to its distal aspect. Fetal type origin of the right PCA. Atheromatous change throughout the right PCA with associated moderate multifocal P2/P3 stenoses. Venous sinuses: Grossly patent allowing for timing the contrast bolus. Anatomic variants: Fetal type origin of the right PCA.  No aneurysm. Review of the MIP images confirms the above findings IMPRESSION: CT HEAD IMPRESSION: 1. No acute intracranial abnormality. 2. Few small remote lacunar infarcts about the right basal ganglia and thalamus. CTA HEAD AND NECK IMPRESSION: 1. Negative CTA for emergent large vessel occlusion. 2. Bulky calcified plaque about the left carotid bulb with associated stenosis of up to 70% by NASCET criteria. 3. Atheromatous plaque at the origin of the dominant left vertebral artery with associated severe stenosis. Strongly dominant left vertebral artery otherwise patent within the neck. 4. Atheromatous change throughout the right PCA with associated moderate multifocal P2/P3 stenoses. 5. Mild pulmonary interstitial edema. Electronically Signed   By: Rise Mu M.D.   On: 10/19/2020 22:55   MR BRAIN WO CONTRAST  Result Date: 10/21/2020 CLINICAL DATA:  Stroke follow-up. EXAM: MRI HEAD WITHOUT CONTRAST TECHNIQUE: Multiplanar, multiecho pulse sequences of the brain and surrounding structures were obtained without intravenous contrast. COMPARISON:  MRI of the brain October 20, 2020. FINDINGS: Brain: Interval progression of area of restricted diffusion involving the left ventrolateral thalamus, measuring approximately 15 mm, consistent with acute infarct. No hemorrhage, hydrocephalus, extra-axial collection or mass lesion. Remote lacunar infarct in the right thalamus. Small amount of scattered foci  of T2 hyperintensity are seen within the white matter of the cerebral hemispheres and within the pons, nonspecific, most likely related to chronic small vessel ischemia. Mild parenchymal volume loss. Vascular: Absent flow void in the V3 and V4 segments of the right vertebral artery may be related to slow flow versus occlusion. Skull and upper cervical spine: Normal marrow signal. Sinuses/Orbits: Bilateral lens surgery. Paranasal sinuses are clear. Other: None. IMPRESSION: Significant interval progression of the area of restricted diffusion within the left ventrolateral thalamus consistent with acute infarct, now measuring 15 mm. Electronically Signed   By: Baldemar Lenis M.D.   On: 10/21/2020 15:32   MR BRAIN WO CONTRAST  Result Date: 10/20/2020 CLINICAL DATA:  Initial evaluation for neuro deficit, stroke suspected. Acute right-sided weakness. EXAM: MRI HEAD WITHOUT CONTRAST TECHNIQUE: Multiplanar, multiecho pulse sequences of the brain and surrounding structures were obtained without intravenous contrast. COMPARISON:  Prior CTA from 10/19/2020. FINDINGS: Brain: Mild diffuse prominence of the CSF containing spaces compatible generalized cerebral atrophy. Scattered patchy T2/FLAIR hyperintensity within the periventricular and deep white matter both  cerebral hemispheres as well as the pons, most consistent with chronic small vessel ischemic disease, mild for age. Few scattered remote lacunar infarcts present about the basal ganglia and thalami. There is a subtle punctate 5 mm focus of diffusion signal abnormality involving the left basal ganglia, only seen on coronal DWI sequence (series 7, image 18). Given the patient history of right-sided weakness, findings suspicious for a tiny punctate ischemic infarct. No associated hemorrhage or mass effect. No other diffusion abnormality to suggest acute or subacute ischemia. Gray-white matter differentiation otherwise maintained. No other areas of remote  cortical infarction. No other evidence for acute or chronic intracranial hemorrhage. No mass lesion, midline shift or mass effect. No hydrocephalus or extra-axial fluid collection. Pituitary gland suprasellar region within normal limits. Midline structures intact. Vascular: Major intracranial vascular flow voids are maintained. Skull and upper cervical spine: Craniocervical junction within normal limits. Bone marrow signal intensity normal. No scalp soft tissue abnormality. Sinuses/Orbits: Patient status post bilateral ocular lens replacement. Globes and orbital soft tissues within normal limits. Paranasal sinuses are largely clear. No significant mastoid effusion. Inner ear structures grossly normal. Other: None. IMPRESSION: 1. Subtle 5 mm focus of diffusion signal abnormality involving the left basal ganglia, suspicious for a tiny ischemic infarct given the patient history of right-sided weakness. No associated hemorrhage or mass effect. 2. No other acute intracranial abnormality. 3. Mild chronic microvascular ischemic disease with a few scattered remote lacunar infarcts about the basal ganglia and thalami. Electronically Signed   By: Rise Mu M.D.   On: 10/20/2020 05:49   US Carotid Bilateral  Result Date: 10/21/2020 CLINICAL DATA:  87 year old female with a history of stroke EXAM: BILATERAL CAROTID DUPLEX ULTRASOUND TECHNIQUE: Wallace Cullens scale imaging, color Doppler and duplex ultrasound were performed of bilateral carotid and vertebral arteries in the neck. COMPARISON:  None. FINDINGS: Criteria: Quantification of carotid stenosis is based on velocity parameters that correlate the residual internal carotid diameter with NASCET-based stenosis levels, using the diameter of the distal internal carotid lumen as the denominator for stenosis measurement. The following velocity measurements were obtained: RIGHT ICA:  Systolic 82 cm/sec, Diastolic 19 cm/sec CCA:  60 cm/sec SYSTOLIC ICA/CCA RATIO:  1.4 ECA:   225 cm/sec LEFT ICA:  Systolic 106 cm/sec, Diastolic 19 cm/sec CCA:  75 cm/sec SYSTOLIC ICA/CCA RATIO:  1.4 ECA:  192 cm/sec Right Brachial SBP: Not acquired Left Brachial SBP: Not acquired RIGHT CAROTID ARTERY: No significant calcifications of the right common carotid artery. Intermediate waveform maintained. Moderate heterogeneous and partially calcified plaque at the right carotid bifurcation. No significant lumen shadowing. Low resistance waveform of the right ICA. No significant tortuosity. RIGHT VERTEBRAL ARTERY: Antegrade flow with low resistance waveform. LEFT CAROTID ARTERY: No significant calcifications of the left common carotid artery. Intermediate waveform maintained. Moderate heterogeneous and partially calcified plaque at the left carotid bifurcation. No significant lumen shadowing. Low resistance waveform of the left ICA. No significant tortuosity. LEFT VERTEBRAL ARTERY:  Antegrade flow with low resistance waveform. IMPRESSION: Color duplex indicates moderate heterogeneous and calcified plaque, with no hemodynamically significant stenosis by duplex criteria in the extracranial cerebrovascular circulation. Signed, Yvone Neu. Reyne Dumas, RPVI [facility] Electronically Signed   By: Gilmer Mor D.O.   On: 10/21/2020 16:03   ECHOCARDIOGRAM COMPLETE BUBBLE STUDY  Result Date: 10/20/2020    ECHOCARDIOGRAM REPORT   Patient Name:   [facility] Date of Exam: 10/20/2020 Medical Rec #:  [id number]       Height:  63.0 in Accession #:    [id number]      Weight:       129.4 lb Date of Birth:  [birth year]       BSA:          1.607 m Patient Age:    89 years        BP:           168/62 mmHg Patient Gender: F               HR:           83 bpm. Exam Location:  [facility] Procedure: 2D Echo, Cardiac Doppler, Color Doppler and Saline Contrast Bubble            Study Indications:     TIA 434.9 / G45.9  History:         Patient has no prior history of  Echocardiogram examinations.                  Risk Factors:Hypertension and Diabetes.  Sonographer:     Cristela Blue RDCS (AE) Referring Phys:  [id number] Angie Fava Diagnosing Phys: Lorine Bears MD  Sonographer Comments: Suboptimal parasternal window. IMPRESSIONS  1. Left ventricular ejection fraction, by estimation, is 55 to 60%. The left ventricle has normal function. Left ventricular endocardial border not optimally defined to evaluate regional wall motion. There is mild left ventricular hypertrophy. Left ventricular diastolic parameters are indeterminate.  2. Right ventricular systolic function is normal. The right ventricular size is normal. There is mildly elevated pulmonary artery systolic pressure.  3. The mitral valve is normal in structure. Mild mitral valve regurgitation. No evidence of mitral stenosis. Moderate mitral annular calcification.  4. The aortic valve is normal in structure. Aortic valve regurgitation is not visualized. Mild to moderate aortic valve sclerosis/calcification is present, without any evidence of aortic stenosis.  5. The inferior vena cava is normal in size with greater than 50% respiratory variability, suggesting right atrial pressure of 3 mmHg.  6. Agitated saline contrast bubble study was negative, with no evidence of any interatrial shunt. FINDINGS  Left Ventricle: Left ventricular ejection fraction, by estimation, is 55 to 60%. The left ventricle has normal function. Left ventricular endocardial border not optimally defined to evaluate regional wall motion. The left ventricular internal cavity size was normal in size. There is mild left ventricular hypertrophy. Left ventricular diastolic parameters are indeterminate. Right Ventricle: The right ventricular size is normal. No increase in right ventricular wall thickness. Right ventricular systolic function is normal. There is mildly elevated pulmonary artery systolic pressure. The tricuspid regurgitant velocity is 3.18  m/s,  and with an assumed right atrial pressure of 3 mmHg, the estimated right ventricular systolic pressure is 43.4 mmHg. Left Atrium: Left atrial size was normal in size. Right Atrium: Right atrial size was normal in size. Pericardium: There is no evidence of pericardial effusion. Mitral Valve: The mitral valve is normal in structure. Moderate mitral annular calcification. Mild mitral valve regurgitation. No evidence of mitral valve stenosis. Tricuspid Valve: The tricuspid valve is normal in structure. Tricuspid valve regurgitation is trivial. No evidence of tricuspid stenosis. Aortic Valve: The aortic valve is normal in structure. Aortic valve regurgitation is not visualized. Mild to moderate aortic valve sclerosis/calcification is present, without any evidence of aortic stenosis. Aortic valve mean gradient measures 4.5 mmHg. Aortic valve peak gradient measures 9.4 mmHg. Aortic valve area, by VTI measures 2.66 cm. Pulmonic Valve: The pulmonic valve was normal in structure.  Pulmonic valve regurgitation is not visualized. No evidence of pulmonic stenosis. Aorta: The aortic root is normal in size and structure. Venous: The inferior vena cava is normal in size with greater than 50% respiratory variability, suggesting right atrial pressure of 3 mmHg. IAS/Shunts: No atrial level shunt detected by color flow Doppler. Agitated saline contrast was given intravenously to evaluate for intracardiac shunting. Agitated saline contrast bubble study was negative, with no evidence of any interatrial shunt.  LEFT VENTRICLE PLAX 2D LVIDd:         2.75 cm  Diastology LVIDs:         2.02 cm  LV e' medial:    6.31 cm/s LV PW:         1.18 cm  LV E/e' medial:  18.5 LV IVS:        0.90 cm  LV e' lateral:   6.85 cm/s LVOT diam:     2.00 cm  LV E/e' lateral: 17.1 LV SV:         85 LV SV Index:   53 LVOT Area:     3.14 cm  RIGHT VENTRICLE RV Basal diam:  2.97 cm RV S prime:     16.90 cm/s TAPSE (M-mode): 3.2 cm LEFT ATRIUM           Index        RIGHT ATRIUM          Index LA diam:      2.60 cm 1.62 cm/m  RA Area:     7.99 cm LA Vol (A2C): 24.4 ml 15.18 ml/m RA Volume:   13.80 ml 8.59 ml/m LA Vol (A4C): 26.3 ml 16.37 ml/m  AORTIC VALVE                    PULMONIC VALVE AV Area (Vmax):    2.42 cm     PV Vmax:        0.37 m/s AV Area (Vmean):   2.12 cm     PV Peak grad:   0.5 mmHg AV Area (VTI):     2.66 cm     RVOT Peak grad: 1 mmHg AV Vmax:           153.00 cm/s AV Vmean:          101.250 cm/s AV VTI:            0.320 m AV Peak Grad:      9.4 mmHg AV Mean Grad:      4.5 mmHg LVOT Vmax:         118.00 cm/s LVOT Vmean:        68.400 cm/s LVOT VTI:          0.271 m LVOT/AV VTI ratio: 0.85  AORTA Ao Root diam: 2.77 cm MITRAL VALVE                TRICUSPID VALVE MV Area (PHT): 3.60 cm     TR Peak grad:   40.4 mmHg MV Decel Time: 211 msec     TR Vmax:        318.00 cm/s MV E velocity: 117.00 cm/s MV A velocity: 138.00 cm/s  SHUNTS MV E/A ratio:  0.85         Systemic VTI:  0.27 m                             Systemic Diam: 2.00 cm Lorine Bears MD Electronically  signed by Lorine Bears MD Signature Date/Time: 10/20/2020/1:44:50 PM    Final         Subjective: Pt doing well today.  Reports right side still feeling weak and heavy.  No new or worsening neurologic symptoms.  Eager to get to rehab for further recovery.  Daughter at bedside.   Discharge Exam: Vitals:   10/24/20 0419 10/24/20 0755  BP: (!) 142/52 (!) 158/58  Pulse: 74   Resp: 17   Temp: 98.9 F (37.2 C) 98.1 F (36.7 C)  SpO2: 93% 95%   Vitals:   10/24/20 0120 10/24/20 0419 10/24/20 0500 10/24/20 0755  BP: (!) 153/50 (!) 142/52  (!) 158/58  Pulse: 73 74    Resp: 16 17    Temp: 98.7 F (37.1 C) 98.9 F (37.2 C)  98.1 F (36.7 C)  TempSrc:      SpO2: 94% 93%  95%  Weight:   60.7 kg   Height:        General: Pt is alert, awake, not in acute distress Cardiovascular: RRR, S1/S2 +, no rubs, no gallops Respiratory: CTA bilaterally, no wheezing, no  rhonchi Abdominal: Soft, NT, ND, bowel sounds + Extremities: no edema, no cyanosis Neurologic: normal speech, CN's grossly intact, A&Ox4   The results of significant diagnostics from this hospitalization (including imaging, microbiology, ancillary and laboratory) are listed below for reference.     Microbiology: Recent Results (from the past 240 hour(s))  Resp Panel by RT-PCR (Flu A&B, Covid) Nasopharyngeal Swab     Status: None   Collection Time: 10/19/20 10:26 PM   Specimen: Nasopharyngeal Swab; Nasopharyngeal(NP) swabs in vial transport medium  Result Value Ref Range Status   SARS Coronavirus 2 by RT PCR NEGATIVE NEGATIVE Final    Comment: (NOTE) SARS-CoV-2 target nucleic acids are NOT DETECTED.  The SARS-CoV-2 RNA is generally detectable in upper respiratory specimens during the acute phase of infection. The lowest concentration of SARS-CoV-2 viral copies this assay can detect is 138 copies/mL. A negative result does not preclude SARS-Cov-2 infection and should not be used as the sole basis for treatment or other patient management decisions. A negative result may occur with  improper specimen collection/handling, submission of specimen other than nasopharyngeal swab, presence of viral mutation(s) within the areas targeted by this assay, and inadequate number of viral copies(<138 copies/mL). A negative result must be combined with clinical observations, patient history, and epidemiological information. The expected result is Negative.  Fact Sheet for Patients:  BloggerCourse.com  Fact Sheet for Healthcare Providers:  SeriousBroker.it  This test is no t yet approved or cleared by the [place] FDA and  has been authorized for detection and/or diagnosis of SARS-CoV-2 by FDA under an Emergency Use Authorization (EUA). This EUA will remain  in effect (meaning this test can be used) for the duration of the COVID-19  declaration under Section 564(b)(1) of the Act, 21 U.S.C.section 360bbb-3(b)(1), unless the authorization is terminated  or revoked sooner.       Influenza A by PCR NEGATIVE NEGATIVE Final   Influenza B by PCR NEGATIVE NEGATIVE Final    Comment: (NOTE) The Xpert Xpress SARS-CoV-2/FLU/RSV plus assay is intended as an aid in the diagnosis of influenza from Nasopharyngeal swab specimens and should not be used as a sole basis for treatment. Nasal washings and aspirates are unacceptable for Xpert Xpress SARS-CoV-2/FLU/RSV testing.  Fact Sheet for Patients: BloggerCourse.com  Fact Sheet for Healthcare Providers: SeriousBroker.it  This test is not yet approved or cleared by  the [employer] and has been authorized for detection and/or diagnosis of SARS-CoV-2 by FDA under an Emergency Use Authorization (EUA). This EUA will remain in effect (meaning this test can be used) for the duration of the COVID-19 declaration under Section 564(b)(1) of the Act, 21 U.S.C. section 360bbb-3(b)(1), unless the authorization is terminated or revoked.  Performed at [facility], [address] [place], [place] [address]   Urine Culture     Status: Abnormal   Collection Time: 10/19/20 10:26 PM   Specimen: Urine, Random  Result Value Ref Range Status   Specimen Description   Final    URINE, RANDOM Performed at [facility], [address] [place], [place] [address]    Special Requests   Final    NONE Performed at [facility], [address] [place], [place] [address]    Culture MULTIPLE SPECIES PRESENT, SUGGEST RECOLLECTION (A)  Final   Report Status 10/21/2020 FINAL  Final     Labs: BNP (last 3 results) No results for input(s): BNP in the last 8760 hours. Basic Metabolic Panel: Recent Labs  Lab 10/19/20 2101 10/20/20 0423 10/21/20 0405 10/22/20 0427 10/23/20 0432 10/24/20 0529  NA 135 140 138 139  140 136  K 4.3 4.2 4.7 4.3 4.3 3.9  CL 107 111 109 108 108 105  CO2 20* 23 23 22 25 23   GLUCOSE 245* 144* 141* 125* 142* 147*  BUN 28* 26* 27* 31* 30* 29*  CREATININE 1.40* 1.34* 1.51* 1.57* 1.51* 1.53*  CALCIUM 8.9 9.0 9.0 9.1 9.2 8.8*  MG 1.9 1.8  --   --   --   --    Liver Function Tests: Recent Labs  Lab 10/19/20 2101 10/20/20 0423  AST 18 16  ALT 12 11  ALKPHOS 103 83  BILITOT 0.7 0.5  PROT 7.3 6.5  ALBUMIN 4.1 3.5   No results for input(s): LIPASE, AMYLASE in the last 168 hours. No results for input(s): AMMONIA in the last 168 hours. CBC: Recent Labs  Lab 10/19/20 2101 10/20/20 0423 10/21/20 0405 10/23/20 0432 10/24/20 0529  WBC 8.1 6.7 12.3* 13.3* 13.3*  NEUTROABS 3.9  --   --   --   --   HGB 11.9* 10.7* 11.6* 11.5* 11.5*  HCT 35.7* 32.9* 34.7* 34.4* 34.1*  MCV 94.9 94.8 94.3 94.0 92.2  PLT 242 206 220 213 217   Cardiac Enzymes: No results for input(s): CKTOTAL, CKMB, CKMBINDEX, TROPONINI in the last 168 hours. BNP: Invalid input(s): POCBNP CBG: Recent Labs  Lab 10/23/20 1219 10/23/20 1707 10/23/20 1809 10/23/20 2048 10/24/20 0930  GLUCAP 166* 354* 333* 189* 223*   D-Dimer No results for input(s): DDIMER in the last 72 hours. Hgb A1c No results for input(s): HGBA1C in the last 72 hours. Lipid Profile No results for input(s): CHOL, HDL, LDLCALC, TRIG, CHOLHDL, LDLDIRECT in the last 72 hours. Thyroid function studies No results for input(s): TSH, T4TOTAL, T3FREE, THYROIDAB in the last 72 hours.  Invalid input(s): FREET3 Anemia work up No results for input(s): VITAMINB12, FOLATE, FERRITIN, TIBC, IRON, RETICCTPCT in the last 72 hours. Urinalysis    Component Value Date/Time   COLORURINE STRAW (A) 10/19/2020 2226   APPEARANCEUR CLEAR (A) 10/19/2020 2226   LABSPEC 1.009 10/19/2020 2226   PHURINE 5.0 10/19/2020 2226   GLUCOSEU NEGATIVE 10/19/2020 2226   HGBUR NEGATIVE 10/19/2020 2226   BILIRUBINUR NEGATIVE 10/19/2020 2226   KETONESUR NEGATIVE  10/19/2020 2226   PROTEINUR NEGATIVE 10/19/2020 2226  NITRITE NEGATIVE 10/19/2020 2226   LEUKOCYTESUR TRACE (A) 10/19/2020 2226   Sepsis Labs Invalid input(s): PROCALCITONIN,  WBC,  LACTICIDVEN Microbiology Recent Results (from the past 240 hour(s))  Resp Panel by RT-PCR (Flu A&B, Covid) Nasopharyngeal Swab     Status: None   Collection Time: 10/19/20 10:26 PM   Specimen: Nasopharyngeal Swab; Nasopharyngeal(NP) swabs in vial transport medium  Result Value Ref Range Status   SARS Coronavirus 2 by RT PCR NEGATIVE NEGATIVE Final    Comment: (NOTE) SARS-CoV-2 target nucleic acids are NOT DETECTED.  The SARS-CoV-2 RNA is generally detectable in upper respiratory specimens during the acute phase of infection. The lowest concentration of SARS-CoV-2 viral copies this assay can detect is 138 copies/mL. A negative result does not preclude SARS-Cov-2 infection and should not be used as the sole basis for treatment or other patient management decisions. A negative result may occur with  improper specimen collection/handling, submission of specimen other than nasopharyngeal swab, presence of viral mutation(s) within the areas targeted by this assay, and inadequate number of viral copies(<138 copies/mL). A negative result must be combined with clinical observations, patient history, and epidemiological information. The expected result is Negative.  Fact Sheet for Patients:  BloggerCourse.com  Fact Sheet for Healthcare Providers:  SeriousBroker.it  This test is no t yet approved or cleared by the [place] FDA and  has been authorized for detection and/or diagnosis of SARS-CoV-2 by FDA under an Emergency Use Authorization (EUA). This EUA will remain  in effect (meaning this test can be used) for the duration of the COVID-19 declaration under Section 564(b)(1) of the Act, 21 U.S.C.section 360bbb-3(b)(1), unless the authorization is  terminated  or revoked sooner.       Influenza A by PCR NEGATIVE NEGATIVE Final   Influenza B by PCR NEGATIVE NEGATIVE Final    Comment: (NOTE) The Xpert Xpress SARS-CoV-2/FLU/RSV plus assay is intended as an aid in the diagnosis of influenza from Nasopharyngeal swab specimens and should not be used as a sole basis for treatment. Nasal washings and aspirates are unacceptable for Xpert Xpress SARS-CoV-2/FLU/RSV testing.  Fact Sheet for Patients: BloggerCourse.com  Fact Sheet for Healthcare Providers: SeriousBroker.it  This test is not yet approved or cleared by the [place] FDA and has been authorized for detection and/or diagnosis of SARS-CoV-2 by FDA under an Emergency Use Authorization (EUA). This EUA will remain in effect (meaning this test can be used) for the duration of the COVID-19 declaration under Section 564(b)(1) of the Act, 21 U.S.C. section 360bbb-3(b)(1), unless the authorization is terminated or revoked.  Performed at [facility], [address], [place], [place] [address]   Urine Culture     Status: Abnormal   Collection Time: 10/19/20 10:26 PM   Specimen: Urine, Random  Result Value Ref Range Status   Specimen Description   Final    URINE, RANDOM Performed at [facility], [address], [place], [place] [address]    Special Requests   Final    NONE Performed at [facility], [address] [place], [place] [address]    Culture MULTIPLE SPECIES PRESENT, SUGGEST RECOLLECTION (A)  Final   Report Status 10/21/2020 FINAL  Final     Time coordinating discharge: Over 30 minutes  SIGNED:   Pennie Banter, DO [facility] 10/24/2020, 10:07 AM   If 7PM-7AM, please contact night-coverage [email or web address]

## 2020-10-24 NOTE — Progress Notes (Signed)
  Speech Language Pathology Treatment: Cognitive-Linquistic  Patient Details Name: Navdeep Fessenden MRN: [id number] DOB: 05/09/1928 Today's Date: 10/24/2020 Time: 2595-6387 SLP Time Calculation (min) (ACUTE ONLY): 20 min  Assessment / Plan / Recommendation Clinical Impression  Pt seen for ongoing treatment of expressive aphasia and dysarthria. Pt presents with much improved speech intelligibility. She states, "I have learned that if I talk slowly, my speech sounds better." She independently achieved ~ 80% speech intelligibility at the simple conversation level with self-correcting several multi-syllabic words. Pt's word finding is also improved. Pt able to list 10 items within basic and semi-complex categories.     HPI HPI: 87 y.o. female with medical history significant for type 2 diabetes mellitus, essential hypertension, acquired hypothyroidism, hyperlipidemia, stage III chronic kidney disease with baseline creatinine 1.4-1.7, who is admitted to [facility] on 10/19/2020 with suspected TIA versus acute ischemic CVA after presenting from home to [facility] ED complaining of right-sided weakness. Confirmed L basal ganglia ischemic stroke. 10/21/2020 MRI confirms extension in CVA from 3mm to 3mm      SLP Plan  Continue with current plan of care       Recommendations                   Follow up Recommendations: Inpatient Rehab SLP Visit Diagnosis: Aphasia (R47.01);Dysarthria and anarthria (R47.1) Plan: Continue with current plan of care       GO               Vinicio Lynk B. Dreama Saa M.S., CCC-SLP, [facility] Speech-Language Pathologist Rehabilitation Services Office [phone]  Kedar Sedano Dreama Saa 10/24/2020, 11:02 AM

## 2020-10-24 NOTE — Progress Notes (Signed)
Patient ID: Cassandra Schaefer, female   DOB: Aug 01, 1928, 87 y.o.   MRN: [id number] Met with the patient to review role of the nurse CM and initiate education. Reviewed secondary stroke risks including HTn, HLD (LDL 43; Trig 211) and DM (A1c 7.0). 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 Continue to follow along to discharge to address educational needs. Collaborate with the SW to facilitate preparation for discharge. Margarito Liner, RN

## 2020-10-24 NOTE — Progress Notes (Signed)
Inpatient Rehabilitation Medication Review by a Pharmacist  A complete drug regimen review was completed for this patient to identify any potential clinically significant medication issues.  Clinically significant medication issues were identified:  no  Check AMION for pharmacist assigned to patient if future medication questions/issues arise during this admission.  Pharmacist comments:   Time spent performing this drug regimen review (minutes):  10   Dennie Fetters, [place] 10/24/2020 8:34 PM

## 2020-10-24 NOTE — Progress Notes (Signed)
INPATIENT REHABILITATION ADMISSION NOTE   Arrival Method: via PTAR      Mental Orientation: A&Ox 4    Assessment: completed    Skin: intacts except for small sore on medial aspect of left ankle    IV'S: none   Pain: none    Tubes and Drains: none    Safety Measures: $ bed rails and floor mats    Vital Signs: completed    Height and Weight:  5'3  131lbs    Rehab Orientation: completed    Family: notified and supportive     Notes: Patient ID: Cassandra Schaefer, female   DOB: August 24, 1928, 87 y.o.   MRN: [id number]

## 2020-10-25 LAB — GLUCOSE, CAPILLARY
Glucose-Capillary: 154 mg/dL — ABNORMAL HIGH (ref 70–99)
Glucose-Capillary: 140 mg/dL — ABNORMAL HIGH (ref 70–99)
Glucose-Capillary: 119 mg/dL — ABNORMAL HIGH (ref 70–99)
Glucose-Capillary: 119 mg/dL — ABNORMAL HIGH (ref 70–99)

## 2020-10-25 NOTE — Evaluation (Signed)
Occupational Therapy Assessment and Plan  Patient Details  Name: Cassandra Schaefer MRN: [id number] Date of Birth: June 01, 1928  OT Diagnosis: hemiplegia affecting dominant side and muscle weakness (generalized) Rehab Potential: Rehab Potential (ACUTE ONLY): Good ELOS: 7-10 days   Today's Date: 10/25/2020 OT Individual Time: 0700-0800 OT Individual Time Calculation (min): 60 min     Hospital Problem: Principal Problem:   Left basal ganglia embolic stroke [facility])   Past Medical History:  Past Medical History:  Diagnosis Date   Anemia    Cough    RESOLVING, NO FEVER   Diabetes mellitus without complication ([place])    Gout    Hypertension    Hypothyroidism    PUD (peptic ulcer disease)    Wears dentures    full upper, partial lower   Past Surgical History:  Past Surgical History:  Procedure Laterality Date   AMPUTATION TOE Left 12/14/2017   Procedure: AMPUTATION TOE-4TH MPJ;  Surgeon: Samara Deist, DPM;  Location: [place];  Service: Podiatry;  Laterality: Left;  IVA LOCAL Diabetic - oral meds   APPENDECTOMY     CATARACT EXTRACTION W/PHACO Right 06/23/2015   Procedure: CATARACT EXTRACTION PHACO AND INTRAOCULAR LENS PLACEMENT (IOC);  Surgeon: Estill Cotta, MD;  Location: [facility];  Service: Ophthalmology;  Laterality: Right;  [place] 01:28 AP% 23.4 CDE 36.14 fluid pack lot # [id number]   CATARACT EXTRACTION W/PHACO Left 07/28/2015   Procedure: CATARACT EXTRACTION PHACO AND INTRAOCULAR LENS PLACEMENT (IOC);  Surgeon: Estill Cotta, MD;  Location: [facility];  Service: Ophthalmology;  Laterality: Left;  [place]    1:29.7 AP%  24.9 CDE   41.32 fluid casette lot #[id number] H exp 09/23/2016   COONOSCOPY     AND ENDOSCOPY   DILATION AND CURETTAGE OF UTERUS     TONSILLECTOMY     TUBAL LIGATION      Assessment & Plan Clinical Impression: Cassandra Schaefer is a 87 year old right-handed female with history of T2DM, HTN, hypothyroidism, CKD III who was admitted to [facility] on 10/19/2020 with  difficulty walking due to right-sided weakness and sensory deficits as well as word finding deficits that started the day before.  BP elevated at admission patient outside tPA window.  MRI brain done showing subtle diffusion abnormality left basal ganglia with few scattered remote lacunar infarcts.  CTA head neck showed 70% stenosis left carotid bulb with associated severe stenosis at origin of dominant left-VA.  Stroke felt to be likely due to carotid artery stenosis and Dr. Rhea Pink consulted for input but patient had worsening of symptoms with lethargy on 06/27.  Follow-up MRI showed progression of infarct with increasing signs from 5 to 15 mm.  Plavix added to ASA per input from Dr. Cheral Marker.     She has had issues with tachycardia with occasional PACs but no A. Fib--Cardiology felt that stroke due to CAS and recommended CEA when appropriate.  Dr. Lucky Cowboy recommended waiting a couple of weeks prior to stenting of L-CEA. Patient with resultant mild dysarthria with word finding deficits and right sided weakness affecting mobility and ADLs.  Patient transferred to [name] on 10/24/2020 .    Patient currently requires  min-max assist  with basic self-care skills secondary to muscle weakness, decreased cardiorespiratoy endurance, ataxia, decreased coordination, and decreased motor planning, and decreased standing balance, decreased postural control, and decreased balance strategies.  Prior to hospitalization, patient could complete BADLs with modified independent .  Patient will benefit from skilled intervention to increase independence with basic self-care skills prior to discharge home  with care partner.  Anticipate patient will require intermittent supervision and no further OT follow recommended.  OT - End of Session Activity Tolerance: Decreased this session Endurance Deficit: Yes OT Assessment Rehab Potential (ACUTE ONLY): Good OT Patient demonstrates impairments in the following area(s):  Balance;Safety;Sensory;Cognition;Endurance;Motor OT Basic ADL's Functional Problem(s): Grooming;Bathing;Dressing;Toileting OT Transfers Functional Problem(s): Toilet;Tub/Shower OT Additional Impairment(s): Fuctional Use of Upper Extremity OT Plan OT Intensity: Minimum of 1-2 x/day, 45 to 90 minutes OT Frequency: 5 out of 7 days OT Duration/Estimated Length of Stay: 7-10 days OT Treatment/Interventions: Balance/vestibular training;Cognitive remediation/compensation;Community reintegration;Discharge planning;Functional mobility training;Functional electrical stimulation;DME/adaptive equipment instruction;Disease mangement/prevention;Neuromuscular re-education;Pain management;Patient/family education;Psychosocial support;Therapeutic Activities;Skin care/wound managment;Splinting/orthotics;Self Care/advanced ADL retraining;Therapeutic Exercise;Wheelchair propulsion/positioning;UE/LE Strength taining/ROM;UE/LE Coordination activities;Visual/perceptual remediation/compensation OT Self Feeding Anticipated Outcome(s): N/A OT Basic Self-Care Anticipated Outcome(s): Mod I- dressing, grooming, toileting; Supervision- bathing OT Toileting Anticipated Outcome(s): Mod I OT Bathroom Transfers Anticipated Outcome(s): Supervision OT Recommendation Patient destination: Home Follow Up Recommendations: None Equipment Recommended: To be determined   OT Evaluation Precautions/Restrictions  Precautions Precautions: Fall General   Vital Signs   Pain Pain Assessment Pain Score: 0-No pain Home Living/Prior Functioning   Vision Baseline Vision/History: Wears glasses Wears Glasses: At all times Patient Visual Report: No change from baseline Vision Assessment?: No apparent visual deficits Perception  Perception: Within Functional Limits Praxis Praxis: Intact Cognition Overall Cognitive Status: Within Functional Limits for tasks assessed Arousal/Alertness: Awake/alert Orientation Level:  Person;Place;Situation Person: Oriented Place: Oriented Situation: Oriented Year: 2022 Month: July Day of Week: Correct Memory: Appears intact Immediate Memory Recall: Sock;Bed;Blue Memory Recall Sock: Without Cue Memory Recall Blue: Without Cue Memory Recall Bed: Without Cue Attention: Sustained Sustained Attention: Appears intact Awareness: Appears intact Problem Solving: Appears intact Safety/Judgment: Appears intact Sensation Sensation Light Touch: Impaired Detail Peripheral sensation comments: Pt reports numbness in fingers that has been present for 1 year d/t diabetes. She reports no changes in sensation with BUEs from recent stroke. Coordination Gross Motor Movements are Fluid and Coordinated: No Fine Motor Movements are Fluid and Coordinated: No Coordination and Movement Description: ataxic movements Finger Nose Finger Test: Difficulty completing with RUE 9 Hole Peg Test: Unable to complete with R hand; L hand- 1:02 Motor  Motor Motor: Ataxia;Hemiplegia  Trunk/Postural Assessment  Cervical Assessment Cervical Assessment: Within Functional Limits Thoracic Assessment Thoracic Assessment: Exceptions to [facility] (kyphotic)  Balance Balance Balance Assessed: Yes Dynamic Sitting Balance Dynamic Sitting - Balance Support: During functional activity;Feet unsupported Dynamic Sitting - Level of Assistance: 5: Stand by assistance Static Standing Balance Static Standing - Balance Support: Bilateral upper extremity supported Static Standing - Level of Assistance: 5: Stand by assistance Dynamic Standing Balance Dynamic Standing - Balance Support: Bilateral upper extremity supported;During functional activity Dynamic Standing - Level of Assistance: 4: Min assist Extremity/Trunk Assessment RUE Assessment RUE Assessment: Exceptions to [facility] Passive Range of Motion (PROM) Comments: WFL Active Range of Motion (AROM) Comments: Shoulder flexion apprx 120 degrees General Strength  Comments: 3+/5 LUE Assessment LUE Assessment: Within Functional Limits General Strength Comments: 4+/5  Care Tool Care Tool Self Care Eating        Oral Care    Oral Care Assist Level: Set up assist    Bathing              Upper Body Dressing(including orthotics)            Lower Body Dressing (excluding footwear)   What is the patient wearing?: Pants;Underwear/pull up Assist for lower body dressing: Maximal Assistance - Patient 25 - 49%  Putting on/Taking off footwear   What is the patient wearing?: Socks Assist for footwear: Total Assistance - Patient < 25%       Care Tool Toileting Toileting activity   Assist for toileting: Moderate Assistance - Patient 50 - 74%     Care Tool Bed Mobility Roll left and right activity        Sit to lying activity        Lying to sitting edge of bed activity         Care Tool Transfers Sit to stand transfer   Sit to stand assist level: Minimal Assistance - Patient > 75%    Chair/bed transfer   Chair/bed transfer assist level: Minimal Assistance - Patient > 75%     Toilet transfer   Assist Level: Minimal Assistance - Patient > 75%     Care Tool Cognition Expression of Ideas and Wants Expression of Ideas and Wants: Some difficulty - exhibits some difficulty with expressing needs and ideas (e.g, some words or finishing thoughts) or speech is not clear   Understanding Verbal and Non-Verbal Content Understanding Verbal and Non-Verbal Content: Understands (complex and basic) - clear comprehension without cues or repetitions   Memory/Recall Ability *first 3 days only Memory/Recall Ability *first 3 days only: Current season;Location of own room;Staff names and faces;That he or she is in a hospital/hospital unit    Refer to Care Plan for [place] 1 OT Short Term Goal 1 (Week 1): STGs=LTGs due to short ELOS  Recommendations for other services: None    Skilled Therapeutic Intervention OT  evaluation completed. Discussed role of OT, rehab, safety plan, goals, ELOS, and possible DME. Pt declined bathing and dressing on this date, however OT able to assess dressing during toileting. Pt ambulated bed<>toilet with min A using RW as she demonstrated decreased stride length with RLE. OT provided mod assist for toileting task. Pt completed oral care while sitting at sink with setup assist due to difficulty opening toothpaste. Completed 9 hole peg test with L hand at 1:02 and unable to complete with R hand. OT left foam blocks in pt's room to address FM skills. At end of session pt left sitting in w/c awaiting speech eval. All needs in reach.   ADL   Mobility  Transfers Sit to Stand: Minimal Assistance - Patient > 75% Stand to Sit: Minimal Assistance - Patient > 75%   Discharge Criteria: Patient will be discharged from OT if patient refuses treatment 3 consecutive times without medical reason, if treatment goals not met, if there is a change in medical status, if patient makes no progress towards goals or if patient is discharged from hospital.  The above assessment, treatment plan, treatment alternatives and goals were discussed and mutually agreed upon: by patient  Duayne Cal 10/25/2020, 10:07 AM

## 2020-10-25 NOTE — Evaluation (Signed)
Speech Language Pathology Assessment and Plan  Patient Details  Name: Ercell Perlman MRN: [id number] Date of Birth: [birth year]  SLP Diagnosis: Aphasia;Dysarthria  Rehab Potential: Good ELOS: 7-10 days    Today's Date: 10/25/2020 SLP Individual Time: 0800-0900 SLP Individual Time Calculation (min): 60 min   Hospital Problem: Principal Problem:   Left basal ganglia embolic stroke [facility])  Past Medical History:  Past Medical History:  Diagnosis Date   Anemia    Cough    RESOLVING, NO FEVER   Diabetes mellitus without complication ([place])    Gout    Hypertension    Hypothyroidism    PUD (peptic ulcer disease)    Wears dentures    full upper, partial lower   Past Surgical History:  Past Surgical History:  Procedure Laterality Date   AMPUTATION TOE Left 12/14/2017   Procedure: AMPUTATION TOE-4TH MPJ;  Surgeon: Samara Deist, DPM;  Location: [place];  Service: Podiatry;  Laterality: Left;  IVA LOCAL Diabetic - oral meds   APPENDECTOMY     CATARACT EXTRACTION W/PHACO Right 06/23/2015   Procedure: CATARACT EXTRACTION PHACO AND INTRAOCULAR LENS PLACEMENT (IOC);  Surgeon: Estill Cotta, MD;  Location: [facility];  Service: Ophthalmology;  Laterality: Right;  [place] 01:28 AP% 23.4 CDE 36.14 fluid pack lot # [id number]   CATARACT EXTRACTION W/PHACO Left 07/28/2015   Procedure: CATARACT EXTRACTION PHACO AND INTRAOCULAR LENS PLACEMENT (IOC);  Surgeon: Estill Cotta, MD;  Location: [facility];  Service: Ophthalmology;  Laterality: Left;  [place]    1:29.7 AP%  24.9 CDE   41.32 fluid casette lot #[id number] H exp 09/23/2016   COONOSCOPY     AND ENDOSCOPY   DILATION AND CURETTAGE OF UTERUS     TONSILLECTOMY     TUBAL LIGATION      Assessment / Plan / Recommendation   Clinical Impression Patient presents with a mild-mod expressive aphasia and mild-mod dysarthria (appears to be flaccid and ataxic) but with receptive language skills appearing WFL-WNL. Patient is fairly consistent with  awareness to semantic errors but less so with phonemic errors. She exhibits motor speech errors with multisyllabic words and phrases but has already started to learn to slow down speech rate. She named 50/60 pictures from [place] without cues. Automatic speech, repetition, yes/no questions and following verbal and written directions were al WNL. Patient had the most difficulty with summarizing and responding to open-ended questions as her speech rate increased and her word finding and motor speech abilities declined resulting in decrease in speech intelligiblity to less than 70%. Patient will benefit from skilled SLP intervention during [name] stay to improve her ability to effectively communicate her wants/needs/thoughts to others. SLP anticipates she will also benefit from outpatient ST services upon discharge.  Skilled Therapeutic Interventions          Speech-Language Evaluation, [place] Naming Test  SLP Assessment  Patient will need skilled [facility] during [name] admission    Recommendations  Patient destination: Home Follow up Recommendations: Outpatient SLP Equipment Recommended: None recommended by SLP    SLP Frequency 3 to 5 out of 7 days   SLP Duration  SLP Intensity  SLP Treatment/Interventions 7-10 days  Minumum of 1-2 x/day, 30 to 90 minutes  Speech/Language facilitation;Functional tasks;Patient/family education;Cueing hierarchy    Pain Pain Assessment Pain Scale: 0-10 Pain Score: 0-No pain  Prior Functioning Cognitive/Linguistic Baseline: Within functional limits Type of Home: House  Lives With: Daughter Available Help at Discharge: Family;Available 24 hours/day Vocation: Retired  Merchandiser, retail  Cognition Overall Cognitive Status: Within Functional Limits for tasks assessed Arousal/Alertness: Awake/alert Orientation Level: Oriented X4 Attention: Sustained Sustained Attention: Appears intact Memory: Appears intact Awareness: Appears  intact Problem Solving: Appears intact Safety/Judgment: Appears intact  Comprehension Auditory Comprehension Overall Auditory Comprehension: Appears within functional limits for tasks assessed Visual Recognition/Discrimination Discrimination: Within Function Limits Reading Comprehension Reading Status: Within funtional limits Expression Expression Primary Mode of Expression: Verbal Verbal Expression Overall Verbal Expression: Impaired Initiation: No impairment Automatic Speech: Name;Social Response Level of Generative/Spontaneous Verbalization: Phrase Repetition: No impairment Level of Impairment: Word level Naming: Impairment Responsive: 76-100% accurate Confrontation: Impaired Verbal Errors: Phonemic paraphasias;Aware of errors;Semantic paraphasias;Not aware of errors Pragmatics: No impairment Interfering Components: Speech intelligibility Effective Techniques: Semantic cues;Written cues Non-Verbal Means of Communication: Not applicable Written Expression Dominant Hand: Right Written Expression: Not tested Oral Motor Oral Motor/Sensory Function Overall Oral Motor/Sensory Function: Mild impairment Facial ROM: Reduced right Facial Symmetry: Abnormal symmetry right Facial Strength: Reduced right Facial Sensation: Within Functional Limits Lingual ROM: Within Functional Limits Lingual Symmetry: Within Functional Limits Lingual Strength: Within Functional Limits Lingual Sensation: Within Functional Limits Mandible: Within Functional Limits Motor Speech Overall Motor Speech: Impaired Respiration: Within functional limits Level of Impairment: Phrase Phonation: Hoarse Resonance: Within functional limits Articulation: Impaired Level of Impairment: Word Intelligibility: Intelligibility reduced Word: 75-100% accurate Phrase: 75-100% accurate Sentence: 75-100% accurate Conversation: 75-100% accurate Motor Planning: Impaired Level of Impairment: Word Motor Speech Errors:  Aware;Inconsistent Effective Techniques: Slow rate;Increased vocal intensity;Over-articulate;Pacing  Care Tool Care Tool Cognition Expression of Ideas and Wants Expression of Ideas and Wants: Some difficulty - exhibits some difficulty with expressing needs and ideas (e.g, some words or finishing thoughts) or speech is not clear   Understanding Verbal and Non-Verbal Content Understanding Verbal and Non-Verbal Content: Understands (complex and basic) - clear comprehension without cues or repetitions   Memory/Recall Ability *first 3 days only Memory/Recall Ability *first 3 days only: Current season;Location of own room;Staff names and faces;That he or she is in a hospital/hospital unit     PMSV Assessment  PMSV Trial Intelligibility: Intelligibility reduced Word: 75-100% accurate Phrase: 75-100% accurate Sentence: 75-100% accurate Conversation: 75-100% accurate  Bedside Swallowing Assessment General    Oral Care Assessment   Ice Chips   Thin Liquid   Nectar Thick   Honey Thick   Puree   Solid   BSE Assessment    Short Term Goals: Week 1: SLP Short Term Goal 1 (Week 1): Patient will produce multisyllabic words and phrases (more than 3 syllables) with 90% accuracy and minA cues. SLP Short Term Goal 2 (Week 1): Patient will demonstrate awareness to phonemic errors during verbal production in structured tasks, with minA cues. SLP Short Term Goal 3 (Week 1): Patient will summarize after reading a short paragraph length text with 85% accuracy and minA cues. SLP Short Term Goal 4 (Week 1): Patient will perform divergent and convergent naming tasks with minA. SLP Short Term Goal 5 (Week 1): Patient will maintain adequate vocal intensity and speech rate during structured oral reading or conversational tasks with minA.  Refer to Care Plan for Long Term Goals  Recommendations for other services: None   Discharge Criteria: Patient will be discharged from SLP if patient refuses  treatment 3 consecutive times without medical reason, if treatment goals not met, if there is a change in medical status, if patient makes no progress towards goals or if patient is discharged from hospital.  The above assessment, treatment plan, treatment alternatives and goals were discussed and  mutually agreed upon: by patient  Sonia Baller, MA, CCC-SLP Speech Therapy

## 2020-10-25 NOTE — Progress Notes (Signed)
PROGRESS NOTE   Subjective/Complaints: Pt had a pretty good night. Noticing some tingling along the top of right foot, minimally painful  ROS: Patient denies fever, rash, sore throat, blurred vision, nausea, vomiting, diarrhea, cough, shortness of breath or chest pain, joint or back pain, headache, or mood change.    Objective:   No results found. Recent Labs    10/23/20 0432 10/24/20 0529  WBC 13.3* 13.3*  HGB 11.5* 11.5*  HCT 34.4* 34.1*  PLT 213 217   Recent Labs    10/23/20 0432 10/24/20 0529  NA 140 136  K 4.3 3.9  CL 108 105  CO2 25 23  GLUCOSE 142* 147*  BUN 30* 29*  CREATININE 1.51* 1.53*  CALCIUM 9.2 8.8*    Intake/Output Summary (Last 24 hours) at 10/25/2020 0929 Last data filed at 10/25/2020 0741 Gross per 24 hour  Intake 897 ml  Output --  Net 897 ml        Physical Exam: Vital Signs Blood pressure 110/72, pulse 69, temperature 98.7 F (37.1 C), temperature source Oral, resp. rate 16, height 5\' 3"  (1.6 m), weight 57.6 kg, SpO2 98 %.  General: Alert and oriented x 3, No apparent distress HEENT: Head is normocephalic, atraumatic, PERRLA, EOMI, sclera anicteric, oral mucosa pink and moist, dentition intact, ext ear canals clear,  Neck: Supple without JVD or lymphadenopathy Heart: Reg rate and rhythm. No murmurs rubs or gallops Chest: CTA bilaterally without wheezes, rales, or rhonchi; no distress Abdomen: Soft, non-tender, non-distended, bowel sounds positive. Extremities: No clubbing, cyanosis, or edema. Pulses are 2+ Psych: Pt's affect is appropriate. Pt is cooperative Skin: Clean and intact without signs of breakdown Neuro: Pt is alert and oriented. Dysarthric speech, mild word finding deficits. Right central 7. RUE 4/5 prox to 3/5 distally. RLE 4-/5. Shuffles right foot during gait. Mild sensory loss dorsum right foot. DTR's 1+. Cognitively appropriate Musculoskeletal: Full ROM, No pain with  AROM or PROM in the neck, trunk, or extremities. Posture appropriate    Assessment/Plan: 1. Functional deficits which require 3+ hours per day of interdisciplinary therapy in a comprehensive inpatient rehab setting. Physiatrist is providing close team supervision and 24 hour management of active medical problems listed below. Physiatrist and rehab team continue to assess barriers to discharge/monitor patient progress toward functional and medical goals  Care Tool:  Bathing              Bathing assist       Upper Body Dressing/Undressing Upper body dressing   What is the patient wearing?: Pull over shirt    Upper body assist Assist Level: Moderate Assistance - Patient 50 - 74%    Lower Body Dressing/Undressing Lower body dressing      What is the patient wearing?: Underwear/pull up     Lower body assist Assist for lower body dressing: Moderate Assistance - Patient 50 - 74%     Toileting Toileting    Toileting assist Assist for toileting: Moderate Assistance - Patient 50 - 74%     Transfers Chair/bed transfer  Transfers assist           Locomotion Ambulation   Ambulation assist  Walk 10 feet activity   Assist           Walk 50 feet activity   Assist           Walk 150 feet activity   Assist           Walk 10 feet on uneven surface  activity   Assist           Wheelchair     Assist               Wheelchair 50 feet with 2 turns activity    Assist            Wheelchair 150 feet activity     Assist          Blood pressure 110/72, pulse 69, temperature 98.7 F (37.1 C), temperature source Oral, resp. rate 16, height 5\' 3"  (1.6 m), weight 57.6 kg, SpO2 98 %.  Medical Problem List and Plan: 1.  Functional and mobility deficits secondary to left basal ganglia infarct             -patient may shower             -ELOS/Goals: 7-10 days, mod I goals with PT, OT, SLP  --Continue  [name] therapies including PT, OT, and SLP  2.  Antithrombotics: -DVT/anticoagulation:  Pharmaceutical: Lovenox             -antiplatelet therapy: ASA and plavix 3. Pain Management: Tylenol prn  -mild neuropathic pain Right foot--pt prefers to use tylenol if needed 4. Mood: LCSW to follow for evaluation and support             -antipsychotic agents: N/AA 5. Neuropsych: This patient is capable of making decisions on her own behalf. 6. Skin/Wound Care: Routine pressure-relief measures 7. Fluids/Electrolytes/Nutrition: Monitor intake/output.   --Check CMet on 07/04 -encourage adequate liquid intake 8.  HTN: Monitor blood pressures 3 times daily.  Avoid hypotension. 9.  T2DM: Resume metformin.  Monitor blood sugars ac/hs             --Use SSI for elevated blood sugar and titrate medication as indicated. 10. L- CAS: Continue DAPT--Plavix to stop 5 days prior to procedure.             -- Follow-up with Dr. 09/04 for surgery on outpatient basis. 11.  CKD: Baseline SCR 1.4-1.7 range             -- Monitor renal status with routine checks.  Cr 1.53 7/1    LOS: 1 days A FACE TO FACE EVALUATION WAS PERFORMED  9/1 10/25/2020, 9:29 AM

## 2020-10-25 NOTE — Plan of Care (Signed)
  Problem: RH Balance Goal: LTG Patient will maintain dynamic sitting balance (PT) Description: LTG:  Patient will maintain dynamic sitting balance with assistance during mobility activities (PT) Flowsheets (Taken 10/25/2020 1712) LTG: Pt will maintain dynamic sitting balance during mobility activities with:: Independent with assistive device  Goal: LTG Patient will maintain dynamic standing balance (PT) Description: LTG:  Patient will maintain dynamic standing balance with assistance during mobility activities (PT) Flowsheets (Taken 10/25/2020 1712) LTG: Pt will maintain dynamic standing balance during mobility activities with:: Supervision/Verbal cueing   Problem: Sit to Stand Goal: LTG:  Patient will perform sit to stand with assistance level (PT) Description: LTG:  Patient will perform sit to stand with assistance level (PT) Flowsheets (Taken 10/25/2020 1712) LTG: PT will perform sit to stand in preparation for functional mobility with assistance level: Independent with assistive device   Problem: RH Bed to Chair Transfers Goal: LTG Patient will perform bed/chair transfers w/assist (PT) Description: LTG: Patient will perform bed to chair transfers with assistance (PT). Flowsheets (Taken 10/25/2020 1712) LTG: Pt will perform Bed to Chair Transfers with assistance level: Supervision/Verbal cueing   Problem: RH Car Transfers Goal: LTG Patient will perform car transfers with assist (PT) Description: LTG: Patient will perform car transfers with assistance (PT). Flowsheets (Taken 10/25/2020 1712) LTG: Pt will perform car transfers with assist:: Supervision/Verbal cueing   Problem: RH Furniture Transfers Goal: LTG Patient will perform furniture transfers w/assist (OT/PT) Description: LTG: Patient will perform furniture transfers  with assistance (OT/PT). Flowsheets (Taken 10/25/2020 1712) LTG: Pt will perform furniture transfers with assist:: Supervision/Verbal cueing   Problem: RH  Ambulation Goal: LTG Patient will ambulate in controlled environment (PT) Description: LTG: Patient will ambulate in a controlled environment, # of feet with assistance (PT). Flowsheets (Taken 10/25/2020 1712) LTG: Pt will ambulate in controlled environ  assist needed:: Supervision/Verbal cueing LTG: Ambulation distance in controlled environment: 200 ft with LRAD Goal: LTG Patient will ambulate in home environment (PT) Description: LTG: Patient will ambulate in home environment, # of feet with assistance (PT). Flowsheets (Taken 10/25/2020 1712) LTG: Pt will ambulate in home environ  assist needed:: Supervision/Verbal cueing LTG: Ambulation distance in home environment: at least 60 ft using LRAD   Problem: RH Stairs Goal: LTG Patient will ambulate up and down stairs w/assist (PT) Description: LTG: Patient will ambulate up and down # of stairs with assistance (PT) Flowsheets (Taken 10/25/2020 1712) LTG: Pt will ambulate up/down stairs assist needed:: Supervision/Verbal cueing LTG: Pt will  ambulate up and down number of stairs: at least 3 steps with HR setup as per indoor home environment

## 2020-10-25 NOTE — Evaluation (Addendum)
Physical Therapy Assessment and Plan  Patient Details  Name: Cassandra Schaefer MRN: [id number] Date of Birth: 11-May-1928  PT Diagnosis: Ataxia, Coordination disorder, Difficulty walking, Hemiparesis dominant, and Muscle weakness Rehab Potential: Good ELOS: 8-12 days   Today's Date: 10/25/2020 PT Individual Time: 1300-1400 PT Individual Time Calculation (min): 60 min    Hospital Problem: Principal Problem:   Left basal ganglia embolic stroke [facility])   Past Medical History:  Past Medical History:  Diagnosis Date   Anemia    Cough    RESOLVING, NO FEVER   Diabetes mellitus without complication ([place])    Gout    Hypertension    Hypothyroidism    PUD (peptic ulcer disease)    Wears dentures    full upper, partial lower   Past Surgical History:  Past Surgical History:  Procedure Laterality Date   AMPUTATION TOE Left 12/14/2017   Procedure: AMPUTATION TOE-4TH MPJ;  Surgeon: Samara Deist, DPM;  Location: [place];  Service: Podiatry;  Laterality: Left;  IVA LOCAL Diabetic - oral meds   APPENDECTOMY     CATARACT EXTRACTION W/PHACO Right 06/23/2015   Procedure: CATARACT EXTRACTION PHACO AND INTRAOCULAR LENS PLACEMENT (IOC);  Surgeon: Estill Cotta, MD;  Location: [facility];  Service: Ophthalmology;  Laterality: Right;  [place] 01:28 AP% 23.4 CDE 36.14 fluid pack lot # [id number]   CATARACT EXTRACTION W/PHACO Left 07/28/2015   Procedure: CATARACT EXTRACTION PHACO AND INTRAOCULAR LENS PLACEMENT (IOC);  Surgeon: Estill Cotta, MD;  Location: [facility];  Service: Ophthalmology;  Laterality: Left;  [place]    1:29.7 AP%  24.9 CDE   41.32 fluid casette lot #[id number] H exp 09/23/2016   COONOSCOPY     AND ENDOSCOPY   DILATION AND CURETTAGE OF UTERUS     TONSILLECTOMY     TUBAL LIGATION      Assessment & Plan Clinical Impression: Patient is a 87 y.o. right-handed female with history of T2DM, HTN, hypothyroidism, CKD III who was admitted to [facility] on 10/19/2020 with difficulty walking due  to right-sided weakness and sensory deficits as well as word finding deficits that started the day before.  BP elevated at admission patient outside tPA window.  MRI brain done showing subtle diffusion abnormality left basal ganglia with few scattered remote lacunar infarcts.  CTA head neck showed 70% stenosis left carotid bulb with associated severe stenosis at origin of dominant left-VA.  Stroke felt to be likely due to carotid artery stenosis and Dr. Rhea Pink consulted for input but patient had worsening of symptoms with lethargy on 06/27.  Follow-up MRI showed progression of infarct with increasing signs from 5 to 15 mm.  Plavix added to ASA per input from Dr. Cheral Marker.     She has had issues with tachycardia with occasional PACs but no A. Fib--Cardiology felt that stroke due to CAS and recommended CEA when appropriate.  Dr. Lucky Cowboy recommended waiting a couple of weeks prior to stenting of L-CEA. Patient with resultant mild dysarthria with word finding deficits and right sided weakness affecting mobility and ADLs. [name] recommended due to functional decline.  Patient transferred to [name] on 10/24/2020 .   Patient currently requires min A with mobility secondary to muscle weakness, decreased cardiorespiratoy endurance, impaired timing and sequencing, unbalanced muscle activation, ataxia, and decreased coordination, decreased motor planning,  , and decreased standing balance, hemiplegia, and decreased balance strategies.  Prior to hospitalization, patient was independent  with mobility and lived with Daughter in a House home.  Home access is  Ramped entrance.  Patient will benefit from skilled PT intervention to maximize safe functional mobility, minimize fall risk, and decrease caregiver burden for planned discharge home with 24 hour supervision.  Anticipate patient will  benefit from further PT at next level of care  upon discharge. HHPT vs OPPT to be determined based on pt's progress and ability to travel in community  on return home.  PT - End of Session Activity Tolerance: Tolerates 30+ min activity with multiple rests Endurance Deficit: Yes PT Assessment Rehab Potential (ACUTE/IP ONLY): Good PT Barriers to Discharge: [place] home environment;Decreased caregiver support;Home environment access/layout;Insurance for SNF coverage;Other (comments) (Expressive aphasia) PT Patient demonstrates impairments in the following area(s): Balance;Endurance;Motor;Perception;Safety;Sensory PT Transfers Functional Problem(s): Bed Mobility;Bed to Chair;Car;Furniture;Floor PT Locomotion Functional Problem(s): Ambulation;Wheelchair Mobility;Stairs PT Plan PT Intensity: Minimum of 1-2 x/day ,45 to 90 minutes PT Frequency: 5 out of 7 days PT Duration Estimated Length of Stay: 10-12 days PT Treatment/Interventions: Ambulation/gait training;Discharge planning;Balance/vestibular training;DME/adaptive equipment instruction;Functional mobility training;Neuromuscular re-education;Pain management;Patient/family education;Psychosocial support;Stair training;Therapeutic Activities;Therapeutic Exercise;UE/LE Strength taining/ROM;UE/LE Coordination activities;Visual/perceptual remediation/compensation;Wheelchair propulsion/positioning PT Transfers Anticipated Outcome(s): supervision/ Mod I PT Locomotion Anticipated Outcome(s): supervision PT Recommendation Follow Up Recommendations: 24 hour supervision/assistance;Home health PT;Outpatient PT (HHPT vs OPPT tbd) Patient destination: Home Equipment Recommended: To be determined   PT Evaluation Precautions/Restrictions Precautions Precautions: Fall Precaution Comments: R hemipareisis Restrictions Weight Bearing Restrictions: No General   Vital Signs  Pain Pain Assessment Pain Scale: 0-10 Pain Score: 0-No pain Home Living/Prior Functioning Home Living Available Help at Discharge: Family;Available 24 hours/day Type of Home: House Home Access: Ramped entrance Home Layout:  One level Bathroom Shower/Tub: Chiropodist: Handicapped height Bathroom Accessibility: Yes Additional Comments: Pt goes to senior center 2x/wk for exercise.  Lives With: Daughter Prior Function Level of Independence: Independent with basic ADLs;Independent with homemaking with ambulation;Independent with gait;Independent with transfers  Able to Take Stairs?: Yes Vocation: Retired Comments: Pt reports being I with self care tasks, mobility, and IADL tasks. Pt states she uses quad cane for community mobility but not within the home.  Active, enjoys yoga/exercise at senior center 1-2x/week Vision/Perception  Perception Perception: Within Functional Limits Praxis Praxis: Intact  Cognition Overall Cognitive Status: Within Functional Limits for tasks assessed Arousal/Alertness: Awake/alert Orientation Level: Oriented X4 Attention: Sustained Sustained Attention: Appears intact Memory: Appears intact Awareness: Appears intact Problem Solving: Appears intact Safety/Judgment: Appears intact Sensation Sensation Light Touch: Impaired Detail Peripheral sensation comments: Pt relates peripheral 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 Coordination Gross Motor Movements are Fluid and Coordinated: No Fine Motor Movements are Fluid and Coordinated: No Coordination and Movement Description: ataxic movements/ decreased coordination Heel Shin Test: Good with LLE; slight ataxia and overshoots shin with RLE Motor  Motor Motor: Hemiplegia;Ataxia Motor - Skilled Clinical Observations: ataxic movements noted with general RUE hand placement and RLE foot placement during gait and pivot stepping   Trunk/Postural Assessment  Cervical Assessment Cervical Assessment: Within Functional Limits Thoracic Assessment Thoracic Assessment: Exceptions to [facility] (rounded shoulders/ slight kyphotic posture) Lumbar Assessment Lumbar Assessment: Exceptions to  WFL (slight posterior pelvic tilt) Postural Control Postural Control: Within Functional Limits  Balance Balance Balance Assessed: Yes Standardized Balance Assessment Standardized Balance Assessment: Berg Balance Test Berg Balance Test Sit to Stand: Able to stand using hands after several tries Standing Unsupported: Able to stand 30 seconds unsupported Sitting with Back Unsupported but Feet Supported on Floor or Stool: Able to sit 2 minutes under supervision Stand to Sit: Controls descent by using hands Transfers: Needs one  person to assist Standing Unsupported with Eyes Closed: Unable to keep eyes closed 3 seconds but stays steady Standing Ubsupported with Feet Together: Needs help to attain position and unable to hold for 15 seconds From Standing, Reach Forward with Outstretched Arm: Reaches forward but needs supervision From Standing Position, Pick up Object from Floor: Unable to try/needs assist to keep balance From Standing Position, Turn to Look Behind Over each Shoulder: Needs supervision when turning Turn 360 Degrees: Needs assistance while turning Standing Unsupported, Alternately Place Feet on Step/Stool: Needs assistance to keep from falling or unable to try Standing Unsupported, One Foot in Front: Needs help to step but can hold 15 seconds Standing on One Leg: Unable to try or needs assist to prevent fall Total Score: 15 Dynamic Sitting Balance Dynamic Sitting - Balance Support: During functional activity;Feet supported Dynamic Sitting - Level of Assistance: 5: Stand by assistance Dynamic Sitting - Balance Activities: Reaching for objects;Reaching across midline Static Standing Balance Static Standing - Balance Support: Bilateral upper extremity supported Static Standing - Level of Assistance: 5: Stand by assistance Dynamic Standing Balance Dynamic Standing - Balance Support: Bilateral upper extremity supported;During functional activity Dynamic Standing - Level of  Assistance: 4: Min assist Dynamic Standing - Balance Activities: Reaching for weighted objects;Reaching across midline Extremity Assessment      RLE Assessment RLE Assessment: Exceptions to [facility] RLE Strength Right Hip Flexion: 4-/5 Right Hip Extension: 4-/5 Right Hip ABduction: 4/5 Right Hip ADduction: 4/5 Right Knee Flexion: 4-/5 Right Knee Extension: 4/5 Right Ankle Dorsiflexion: 4/5 Right Ankle Plantar Flexion: 4/5 LLE Assessment LLE Assessment: Exceptions to [facility] LLE Strength Left Hip Flexion: 4/5 Left Hip Extension: 4/5 Left Hip ABduction: 4/5 Left Hip ADduction: 4/5 Left Knee Flexion: 4-/5 Left Knee Extension: 4/5 Left Ankle Dorsiflexion: 4/5 Left Ankle Plantar Flexion: 4/5  Care Tool Care Tool Bed Mobility Roll left and right activity   Roll left and right assist level: Supervision/Verbal cueing    Sit to lying activity   Sit to lying assist level: Contact Guard/Touching assist    Lying to sitting edge of bed activity   Lying to sitting edge of bed assist level: Contact Guard/Touching assist     Care Tool Transfers Sit to stand transfer   Sit to stand assist level: Minimal Assistance - Patient > 75%    Chair/bed transfer   Chair/bed transfer assist level: Minimal Assistance - Patient > 75%     Toilet transfer   Assist Level: Minimal Assistance - Patient > 75%    Car transfer   Car transfer assist level: Minimal Assistance - Patient > 75%      Care Tool Locomotion Ambulation   Assist level: Minimal Assistance - Patient > 75% Assistive device: Walker-rolling Max distance: 75 ft  Walk 10 feet activity   Assist level: Minimal Assistance - Patient > 75% Assistive device: Walker-rolling   Walk 50 feet with 2 turns activity   Assist level: Minimal Assistance - Patient > 75% Assistive device: Walker-rolling  Walk 150 feet activity Walk 150 feet activity did not occur: Safety/medical concerns      Walk 10 feet on uneven surfaces activity Walk 10 feet  on uneven surfaces activity did not occur: Safety/medical concerns      Stairs   Assist level: Minimal Assistance - Patient > 75% Stairs assistive device: 2 hand rails Max number of stairs: 4  Walk up/down 1 step activity   Walk up/down 1 step (curb) assist level: Minimal Assistance - Patient > 75%  Walk up/down 1 step or curb assistive device: 1 hand rail    Walk up/down 4 steps activity Walk up/down 4 steps assist level: Minimal Assistance - Patient > 75% Walk up/down 4 steps assistive device: 2 hand rails  Walk up/down 12 steps activity Walk up/down 12 steps activity did not occur: Safety/medical concerns      Pick up small objects from floor Pick up small object from the floor (from standing position) activity did not occur: Safety/medical concerns      Wheelchair Will patient use wheelchair at discharge?: No Type of Wheelchair: Manual Wheelchair activity did not occur: N/A      Wheel 50 feet with 2 turns activity Wheelchair 50 feet with 2 turns activity did not occur: N/A    Wheel 150 feet activity Wheelchair 150 feet activity did not occur: N/A      Refer to Care Plan for Long Term Goals  SHORT TERM GOAL WEEK 1 PT Short Term Goal 1 (Week 1): STG = LTG d/t  ELOS  Recommendations for other services: None   Skilled Therapeutic Intervention Mobility Bed Mobility Bed Mobility: Supine to Sit;Sit to Supine Supine to Sit: Contact Guard/Touching assist Sit to Supine: Contact Guard/Touching assist Transfers Transfers: Sit to Stand;Stand to Sit;Stand Pivot Transfers Sit to Stand: Minimal Assistance - Patient > 75% Stand to Sit: Contact Guard/Touching assist Stand Pivot Transfers: Minimal Assistance - Patient > 75% Stand Pivot Transfer Details: Tactile cues for weight shifting;Tactile cues for placement;Verbal cues for sequencing;Verbal cues for technique Transfer (Assistive device): Rolling walker Locomotion  Gait Ambulation: Yes Gait Assistance: Minimal Assistance -  Patient > 75% Gait Distance (Feet): 75 Feet Assistive device: Rolling walker (with R hand AE for grip) Gait Assistance Details: Verbal cues for technique;Verbal cues for precautions/safety;Verbal cues for safe use of DME/AE Gait Gait: Yes Gait Pattern: Impaired Gait Pattern: Step-through pattern;Decreased step length - right;Decreased stance time - right;Decreased hip/knee flexion - right;Ataxic Gait velocity: decreased Stairs / Additional Locomotion Stairs: Yes Stairs Assistance: Minimal Assistance - Patient > 75% Stair Management Technique: Two rails Number of Stairs: 4 Height of Stairs: 6 Wheelchair Mobility Wheelchair Mobility: No  Treatment Session: Patient supine in bed on entrance to room. Patient alert and agreeable to PT session. Patient denied pain during session. On entrance, relates that she needs to use the bathroom.  Therapeutic Activity: Bed Mobility: Patient performed supine --> sit with CGA. VC/ tc required for hand progression. Ataxic movements noted with RUE.  Transfers: Patient performed STS and SPVT transfers throughout session bed <> w/c, w/c <> RW with CGA/ Min A. Toilet transfer requires Min A for power up and controlled descent. Pericare performed with supervision. Max A for clothing mgmt.   Car transfer performed without prior instructions and with Min A for power up and vc for reaching back for/ pushing from seat surface. Car height set to pt related height as similar to home environment.   Gait Training:  Patient ambulated 75 feet using RW with AE for R hand on grip handle. Pt completes with Min A for balance and safety. Intermittent ataxic movements noted during RLE advancement.  Steady pace with step through gait also noted. Provided vc/ tc for foot placement, increased step height with RLE, and heel strike with R foot.  Pt relates ramped entrance to house, but 3 steps inside home with [facility] to descend. Step negotiation training initiated with pt completing 4  steps using BHR and moderate cueing for ascending with LLE first  and descending  with RLE first. VC provided for technique consistently throughout and completed slowly with Min A. Pt relates having been scared with explanation of descent but glad to have assist for guidance and physical help.   Neuromuscular Re-ed: NMR facilitated during session with focus on standing balance. Pt guided in tasks required for Berg Balance test. Pt demos difficulty with all dynamic aspects of balance as well as NBOS items. NMR performed for improvements in motor control and coordination, balance, sequencing, judgement, and self confidence/ efficacy in performing all aspects of mobility at highest level of independence.   Patient seated  in recliner at end of session with brakes locked, seat alarm set, and all needs within reach. Pt's grandchildren arrived at end of session for visit and bringing clothes and other personal items.    Discharge Criteria: Patient will be discharged from PT if patient refuses treatment 3 consecutive times without medical reason, if treatment goals not met, if there is a change in medical status, if patient makes no progress towards goals or if patient is discharged from hospital.  The above assessment, treatment plan, treatment alternatives and goals were discussed and mutually agreed upon: by patient  Alger Simons PT, DPT 10/25/2020, 4:28 PM

## 2020-10-26 LAB — GLUCOSE, CAPILLARY
Glucose-Capillary: 132 mg/dL — ABNORMAL HIGH (ref 70–99)
Glucose-Capillary: 123 mg/dL — ABNORMAL HIGH (ref 70–99)
Glucose-Capillary: 99 mg/dL (ref 70–99)
Glucose-Capillary: 110 mg/dL — ABNORMAL HIGH (ref 70–99)

## 2020-10-26 NOTE — Progress Notes (Signed)
PROGRESS NOTE   Subjective/Complaints: Pt reports no problems overnight. Feels that her therapy went well. Able to sleep last night  ROS: Patient denies fever, rash, sore throat, blurred vision, nausea, vomiting, diarrhea, cough, shortness of breath or chest pain, joint or back pain, headache, or mood change.    Objective:   No results found. Recent Labs    10/24/20 0529  WBC 13.3*  HGB 11.5*  HCT 34.1*  PLT 217   Recent Labs    10/24/20 0529  NA 136  K 3.9  CL 105  CO2 23  GLUCOSE 147*  BUN 29*  CREATININE 1.53*  CALCIUM 8.8*    Intake/Output Summary (Last 24 hours) at 10/26/2020 0828 Last data filed at 10/26/2020 0528 Gross per 24 hour  Intake 1100 ml  Output --  Net 1100 ml        Physical Exam: Vital Signs Blood pressure (!) 136/55, pulse 69, temperature 98.4 F (36.9 C), temperature source Oral, resp. rate 18, height 5\' 3"  (1.6 m), weight 57.6 kg, SpO2 98 %.  Constitutional: No distress . Vital signs reviewed. HEENT: EOMI, oral membranes moist Neck: supple Cardiovascular: RRR without murmur. No JVD    Respiratory/Chest: CTA Bilaterally without wheezes or rales. Normal effort    GI/Abdomen: BS +, non-tender, non-distended Ext: no clubbing, cyanosis, or edema Psych: pleasant and cooperative  Skin: Clean and intact without signs of breakdown Neuro: Pt is alert and oriented. Dysarthric speech again, mild word finding deficits as well. Right central 7. RUE 4/5 prox to 3/5 distally. RLE 4-/5.   Mild sensory loss dorsum right foot. DTR's 1+. Cognitively demonstrates functional insight and awareness as well as memory Musculoskeletal: Full ROM, No pain with AROM or PROM in the neck, trunk, or extremities. Posture appropriate    Assessment/Plan: 1. Functional deficits which require 3+ hours per day of interdisciplinary therapy in a comprehensive inpatient rehab setting. Physiatrist is providing close team  supervision and 24 hour management of active medical problems listed below. Physiatrist and rehab team continue to assess barriers to discharge/monitor patient progress toward functional and medical goals  Care Tool:  Bathing              Bathing assist       Upper Body Dressing/Undressing Upper body dressing   What is the patient wearing?: Pull over shirt    Upper body assist Assist Level: Moderate Assistance - Patient 50 - 74%    Lower Body Dressing/Undressing Lower body dressing      What is the patient wearing?: Pants, Underwear/pull up     Lower body assist Assist for lower body dressing: Maximal Assistance - Patient 25 - 49%     Toileting Toileting    Toileting assist Assist for toileting: Minimal Assistance - Patient > 75%     Transfers Chair/bed transfer  Transfers assist     Chair/bed transfer assist level: Minimal Assistance - Patient > 75%     Locomotion Ambulation   Ambulation assist      Assist level: Minimal Assistance - Patient > 75% Assistive device: Walker-rolling Max distance: 75 ft   Walk 10 feet activity   Assist  Assist level: Minimal Assistance - Patient > 75% Assistive device: Walker-rolling   Walk 50 feet activity   Assist    Assist level: Minimal Assistance - Patient > 75% Assistive device: Walker-rolling    Walk 150 feet activity   Assist Walk 150 feet activity did not occur: Safety/medical concerns         Walk 10 feet on uneven surface  activity   Assist Walk 10 feet on uneven surfaces activity did not occur: Safety/medical concerns         Wheelchair     Assist Will patient use wheelchair at discharge?: No Type of Wheelchair: Manual Wheelchair activity did not occur: N/A         Wheelchair 50 feet with 2 turns activity    Assist    Wheelchair 50 feet with 2 turns activity did not occur: N/A       Wheelchair 150 feet activity     Assist  Wheelchair 150 feet  activity did not occur: N/A       Blood pressure (!) 136/55, pulse 69, temperature 98.4 F (36.9 C), temperature source Oral, resp. rate 18, height 5\' 3"  (1.6 m), weight 57.6 kg, SpO2 98 %.  Medical Problem List and Plan: 1.  Functional and mobility deficits secondary to left basal ganglia infarct             -patient may shower             -ELOS/Goals: 7-10 days, mod I goals with PT, OT, SLP  -Continue [name] therapies including PT, OT, and SLP   2.  Antithrombotics: -DVT/anticoagulation:  Pharmaceutical: Lovenox             -antiplatelet therapy: ASA and plavix 3. Pain Management: Tylenol prn  -mild neuropathic pain Right foot--tylenol seems to be covering 4. Mood: LCSW to follow for evaluation and support             -antipsychotic agents: N/AA 5. Neuropsych: This patient is capable of making decisions on her own behalf. 6. Skin/Wound Care: Routine pressure-relief measures 7. Fluids/Electrolytes/Nutrition: Monitor intake/output.   --Check CMet on 07/05 -encourage adequate liquid intake 8.  HTN: Monitor blood pressures 3 times daily.  Avoid hypotension. 9.  T2DM: Resume metformin.  Monitor blood sugars ac/hs             --Use SSI for elevated blood sugar and titrate medication as indicated.  CBG (last 3)  Recent Labs    10/25/20 1642 10/25/20 2100 10/26/20 0614  GLUCAP 119* 119* 132*    10. L- CAS: Continue DAPT--Plavix to stop 5 days prior to procedure.             -- Follow-up with Dr. 12/27/20 for surgery on outpatient basis. 11.  CKD: Baseline SCR 1.4-1.7 range             -- Monitor renal status with routine checks.  Cr 1.53 7/1    LOS: 2 days A FACE TO FACE EVALUATION WAS PERFORMED  9/1 10/26/2020, 8:28 AM

## 2020-10-27 LAB — GLUCOSE, CAPILLARY
Glucose-Capillary: 114 mg/dL — ABNORMAL HIGH (ref 70–99)
Glucose-Capillary: 126 mg/dL — ABNORMAL HIGH (ref 70–99)
Glucose-Capillary: 113 mg/dL — ABNORMAL HIGH (ref 70–99)

## 2020-10-27 NOTE — Progress Notes (Signed)
Occupational Therapy Session Note  Patient Details  Name: Cassandra Schaefer MRN: [id number] Date of Birth: 30-Mar-1929  Today's Date: 10/27/2020 OT Individual Time: 0349-1791 OT Individual Time Calculation (min): 25 min    Short Term Goals: Week 1:  OT Short Term Goal 1 (Week 1): STGs=LTGs due to short ELOS   Skilled Therapeutic Interventions/Progress Updates:    Pt greeted at time of session semireclined in bed on phone, agreeable to OT session no pain reported. Declined ADL/toileting. Pt stating her phone has been cutting off after speaking approx 20 seconds each call, with NT provided with new phone and tested to ensure working to speak with daughter later. Focus of session on sitting balance/core strength with lateral leans 1x15 onto each elbow and pushing up for weight bearing, 1x15 push/pulls each UE with therapist for feedback, and 1x15 each UE punches for GMC for RUE. Pt reclined alarm on call bell in reach.   Therapy Documentation Precautions:  Precautions Precautions: Fall Precaution Comments: R hemipareisis Restrictions Weight Bearing Restrictions: No    Therapy/Group: Individual Therapy  Erasmo Score 10/27/2020, 12:55 PM

## 2020-10-27 NOTE — Progress Notes (Signed)
PROGRESS NOTE   Subjective/Complaints:   No issues overnite, feels her speech is better   ROS: Patient denies CP, SOB, N/V/D  Objective:   No results found. No results for input(s): WBC, HGB, HCT, PLT in the last 72 hours.  No results for input(s): NA, K, CL, CO2, GLUCOSE, BUN, CREATININE, CALCIUM in the last 72 hours.   Intake/Output Summary (Last 24 hours) at 10/27/2020 0737 Last data filed at 10/27/2020 0865 Gross per 24 hour  Intake 1080 ml  Output --  Net 1080 ml         Physical Exam: Vital Signs Blood pressure (!) 141/65, pulse 71, temperature 98 F (36.7 C), temperature source Oral, resp. rate 16, height 5\' 3"  (1.6 m), weight 57.6 kg, SpO2 95 %.   General: No acute distress Mood and affect are appropriate Heart: Regular rate and rhythm no rubs murmurs or extra sounds Lungs: Clear to auscultation, breathing unlabored, no rales or wheezes Abdomen: Positive bowel sounds, soft nontender to palpation, nondistended Extremities: No clubbing, cyanosis, or edema Skin: No evidence of breakdown, no evidence of rash   Neuro: Pt is alert and oriented. Dysarthric speech again, mild word finding deficits as well. Right central 7. RUE 4/5 prox to 3/5 distally. RLE 4-/5.   Sensation per pt fairly equal RIght vs Left  DTR's 1+. Cognitively demonstrates functional insight and awareness as well as memory Occ word finding issues  Musculoskeletal: Full ROM, No pain with AROM or PROM in the neck, trunk, or extremities. Posture appropriate    Assessment/Plan: 1. Functional deficits which require 3+ hours per day of interdisciplinary therapy in a comprehensive inpatient rehab setting. Physiatrist is providing close team supervision and 24 hour management of active medical problems listed below. Physiatrist and rehab team continue to assess barriers to discharge/monitor patient progress toward functional and medical goals  Care  Tool:  Bathing              Bathing assist       Upper Body Dressing/Undressing Upper body dressing   What is the patient wearing?: Pull over shirt    Upper body assist Assist Level: Moderate Assistance - Patient 50 - 74%    Lower Body Dressing/Undressing Lower body dressing      What is the patient wearing?: Pants, Underwear/pull up     Lower body assist Assist for lower body dressing: Maximal Assistance - Patient 25 - 49%     Toileting Toileting    Toileting assist Assist for toileting: Minimal Assistance - Patient > 75%     Transfers Chair/bed transfer  Transfers assist     Chair/bed transfer assist level: Minimal Assistance - Patient > 75%     Locomotion Ambulation   Ambulation assist      Assist level: Minimal Assistance - Patient > 75% Assistive device: Walker-rolling Max distance: 75 ft   Walk 10 feet activity   Assist     Assist level: Minimal Assistance - Patient > 75% Assistive device: Walker-rolling   Walk 50 feet activity   Assist    Assist level: Minimal Assistance - Patient > 75% Assistive device: Walker-rolling    Walk 150 feet activity  Assist Walk 150 feet activity did not occur: Safety/medical concerns         Walk 10 feet on uneven surface  activity   Assist Walk 10 feet on uneven surfaces activity did not occur: Safety/medical concerns         Wheelchair     Assist Will patient use wheelchair at discharge?: No Type of Wheelchair: Manual Wheelchair activity did not occur: N/A         Wheelchair 50 feet with 2 turns activity    Assist    Wheelchair 50 feet with 2 turns activity did not occur: N/A       Wheelchair 150 feet activity     Assist  Wheelchair 150 feet activity did not occur: N/A       Blood pressure (!) 141/65, pulse 71, temperature 98 F (36.7 C), temperature source Oral, resp. rate 16, height 5\' 3"  (1.6 m), weight 57.6 kg, SpO2 95 %.  Medical Problem List  and Plan: 1.  Functional and mobility deficits secondary to left basal ganglia infarct             -patient may shower             -ELOS/Goals: 7-10 days, mod I goals with PT, OT, SLP  -Continue [name] therapies including PT, OT, and SLP   2.  Antithrombotics: -DVT/anticoagulation:  Pharmaceutical: Lovenox             -antiplatelet therapy: ASA and plavix 3. Pain Management: Tylenol prn  -mild neuropathic pain Right foot--tylenol seems to be covering 4. Mood: LCSW to follow for evaluation and support             -antipsychotic agents: N/AA 5. Neuropsych: This patient is capable of making decisions on her own behalf. 6. Skin/Wound Care: Routine pressure-relief measures 7. Fluids/Electrolytes/Nutrition: Monitor intake/output.   --Check CMet on 07/05 -encourage adequate liquid intake 8.  HTN: Monitor blood pressures 3 times daily.  Avoid hypotension. Vitals:   10/26/20 1931 10/27/20 0509  BP: (!) 140/45 (!) 141/65  Pulse: 75 71  Resp: 16 16  Temp: 98 F (36.7 C) 98 F (36.7 C)  SpO2: 96% 95%   In good range 7/4 9.  T2DM: Resume metformin.  Monitor blood sugars ac/hs             --Use SSI for elevated blood sugar and titrate medication as indicated.  CBG (last 3)  Recent Labs    10/26/20 1635 10/26/20 2111 10/27/20 0602  GLUCAP 99 110* 114*     10. L- CAS: Continue DAPT--Plavix to stop 5 days prior to procedure.             -- Follow-up with Dr. 12/28/20 for surgery on outpatient basis. 11.  CKD: Baseline SCR 1.4-1.7 range             -- Monitor renal status with routine checks.  Cr 1.53 7/1    LOS: 3 days A FACE TO FACE EVALUATION WAS PERFORMED  9/1 10/27/2020, 7:37 AM

## 2020-10-27 NOTE — Progress Notes (Signed)
Physical Therapy Session Note  Patient Details  Name: Cassandra Schaefer MRN: [id number] Date of Birth: 02/04/1929  Today's Date: 10/27/2020 PT Individual Time: 1300-1400 PT Individual Time Calculation (min): 60 min   Short Term Goals: Week 1:  PT Short Term Goal 1 (Week 1): STG = LTG d/t  ELOS  Skilled Therapeutic Interventions/Progress Updates:    Pt received seated in bed, agreeable to PT session. No complaints of pain. Bed mobility Supervision with use of bedrail and HOB elevated. Sit to stand and stand pivot transfer to w/c with RW and min A. Ambulation 2 x 90 ft with RW with R hand splint and CGA for balance. Pt exhibits decreased R heel strike with onset of fatigue. With cues for increased heel strike pt exhibits steppage gait pattern. Pt also exhibits decreased B step length.Trial ambulation with min HHA, pt very ataxic and unable to continue safely with gait with no AD. In // bars ambulation forwards and laterally with agility ladder with focus on increasing step length and LE clearance, fair ability to increase step length with agility ladder due to width of targets. Sidesteps L/R 4 x 10 ft in // bars with CGA for balance, cues for picking up RLE when stepping. Forward/backward amb in // bars with cues for increasing RLE clearance both directions, 4 x 10 ft. Standing alt L/R cone taps 2 x 10 reps, decreased control of RLE as compared to LLE. Toilet transfer with min A and use of grab bar, some assist needed for clothing management due to urgency. See Flowsheet for void. Pt requests to return to bed at end of session. Stand pivot transfer w/c to bed with RW and CGA. Sit to supine Supervision. Pt left seated in bed with needs in reach, bed alarm in place at end of session.  Therapy Documentation Precautions:  Precautions Precautions: Fall Precaution Comments: R hemipareisis Restrictions Weight Bearing Restrictions: No    Therapy/Group: Individual Therapy   Peter Congo, PT, DPT,  CSRS  10/27/2020, 4:56 PM

## 2020-10-27 NOTE — Progress Notes (Signed)
Occupational Therapy Session Note  Patient Details  Name: Cassandra Schaefer MRN: [id number] Date of Birth: 1928-09-01  Today's Date: 10/27/2020 OT Individual Time: 7517-0017 OT Individual Time Calculation (min): 70 min    Short Term Goals: Week 1:  OT Short Term Goal 1 (Week 1): STGs=LTGs due to short ELOS  Skilled Therapeutic Interventions/Progress Updates:    Pt resting in bed upon arrival and agreeable to getting OOB. Supine>sit EOB with supervision. Squat pivot transfer to w/c with min A. OT intervention with focus on RUE functional use and strengthening. Activities included picking up small foam cubes and placing in cup, theraputty (forming into ball with BUE, rolling out with RUE, pinching with RUE, flatening with RUE, forming into peak with RUE, and pulling with BUE), and colored peg board task (pt with difficulty retrieving pegs from container and task downgraded so pt could pick up one peg separately). Pt with increased difficulty completing open chain tasks. Pt completed all tasks with more then reasonable amount of time. Pt returned to room and remained in w/c. All needs within reach and belt alarm activated.  Therapy Documentation Precautions:  Precautions Precautions: Fall Precaution Comments: R hemipareisis Restrictions Weight Bearing Restrictions: No  Pain: Pain Assessment Pain Scale: 0-10 Pain Score: 0-No pain  Therapy/Group: Individual Therapy  Rich Brave 10/27/2020, 12:04 PM

## 2020-10-27 NOTE — Progress Notes (Signed)
Speech Language Pathology Daily Session Note  Patient Details  Name: Alisse Tuite MRN: [id number] Date of Birth: 12/11/1928  Today's Date: 10/27/2020 SLP Individual Time: 0800-0830 SLP Individual Time Calculation (min): 30 min  Short Term Goals: Week 1: SLP Short Term Goal 1 (Week 1): Patient will produce multisyllabic words and phrases (more than 3 syllables) with 90% accuracy and minA cues. SLP Short Term Goal 2 (Week 1): Patient will demonstrate awareness to phonemic errors during verbal production in structured tasks, with minA cues. SLP Short Term Goal 3 (Week 1): Patient will summarize after reading a short paragraph length text with 85% accuracy and minA cues. SLP Short Term Goal 4 (Week 1): Patient will perform divergent and convergent naming tasks with minA. SLP Short Term Goal 5 (Week 1): Patient will maintain adequate vocal intensity and speech rate during structured oral reading or conversational tasks with minA.  Skilled Therapeutic Interventions: Skilled ST intervention performed with focus on language goals. SLP facilitated convergent and divergent naming with supervision A verbal cues for word finding, and min-to-mod A verbal cues for awareness and repair of phonemic errors. Overall min A verbal cues to increase vocal intensity and reduce speech rate during structured speech and language tasks to enhance speech intelligibility and repair breakdowns with speech production, which was mainly noticeable with multisyllabic words. Patient stated "I know when I don't say it clearly, I just need to do a better job of fixing it in the moment." Patient was left in bed with alarm activated and needs within reach. Continue per ST POC.     Pain Pain Assessment Pain Scale: 0-10 Pain Score: 0-No pain  Therapy/Group: Individual Therapy  [name] 10/27/2020, 8:13 AM

## 2020-10-27 NOTE — IPOC Note (Signed)
Overall Plan of Care [facility]) Patient Details Name: Cassandra Schaefer MRN: [id number] DOB: [birth year]  Admitting Diagnosis: Left basal ganglia embolic stroke [facility])  Hospital Problems: Principal Problem:   Left basal ganglia embolic stroke [facility])     Functional Problem List: Nursing Bowel, Medication Management, Safety, Endurance  PT Balance, Endurance, Motor, Perception, Safety, Sensory  OT Balance, Safety, Sensory, Cognition, Endurance, Motor  SLP Linguistic, Motor  TR         Basic ADL's: OT Grooming, Bathing, Dressing, Toileting     Advanced  ADL's: OT       Transfers: PT Bed Mobility, Bed to Chair, Car, Furniture, Civil Service fast streamer, Research scientist (life sciences): PT Ambulation, Psychologist, prison and probation services, Stairs     Additional Impairments: OT Fuctional Use of Upper Extremity  SLP Communication expression    TR      Anticipated Outcomes Item Anticipated Outcome  Self Feeding N/A  Swallowing  N/A   Basic self-care  Mod I- dressing, grooming, toileting; Supervision- bathing  Toileting  Mod I   Bathroom Transfers Supervision  Bowel/Bladder  manage bowel with mod I and bladder with mod I assist  Transfers  supervision/ Mod I  Locomotion  supervision  Communication  mod I basic expression phrase level; supervision A conversational level  Cognition  N/A  Pain  at or below level 4  Safety/Judgment  maintain safety with cues/reminders   Therapy Plan: PT Intensity: Minimum of 1-2 x/day ,45 to 90 minutes PT Frequency: 5 out of 7 days PT Duration Estimated Length of Stay: 8-12 days OT Intensity: Minimum of 1-2 x/day, 45 to 90 minutes OT Frequency: 5 out of 7 days OT Duration/Estimated Length of Stay: 7-10 days SLP Intensity: Minumum of 1-2 x/day, 30 to 90 minutes SLP Frequency: 3 to 5 out of 7 days SLP Duration/Estimated Length of Stay: 7-10 days   Due to the current state of emergency, patients may not be receiving their 3-hours of Medicare-mandated therapy.    Team Interventions: Nursing Interventions Patient/Family Education, Bowel Management, Disease Management/Prevention, Medication Management, Discharge Planning, Pain Management  PT interventions Ambulation/gait training, Discharge planning, Balance/vestibular training, DME/adaptive equipment instruction, Functional mobility training, Neuromuscular re-education, Pain management, Patient/family education, Psychosocial support, Stair training, Therapeutic Activities, Therapeutic Exercise, UE/LE Strength taining/ROM, UE/LE Coordination activities, Visual/perceptual remediation/compensation, Wheelchair propulsion/positioning  OT Interventions Balance/vestibular training, Cognitive remediation/compensation, Community reintegration, Discharge planning, Functional mobility training, Functional electrical stimulation, DME/adaptive equipment instruction, Disease mangement/prevention, Neuromuscular re-education, Pain management, Patient/family education, Psychosocial support, Therapeutic Activities, Skin care/wound managment, Splinting/orthotics, Self Care/advanced ADL retraining, Therapeutic Exercise, Wheelchair propulsion/positioning, UE/LE Strength taining/ROM, UE/LE Coordination activities, Visual/perceptual remediation/compensation  SLP Interventions Speech/Language facilitation, Functional tasks, Patient/family education, Cueing hierarchy  TR Interventions    SW/CM Interventions Discharge Planning, Patient/Family Education, Psychosocial Support   Barriers to Discharge MD  Medical stability  Nursing Decreased caregiver support, Home environment access/layout home w daughter, ramped entry, SPC in community, driving, IADLs PTA  PT Inaccessible home environment, Decreased caregiver support, Home environment Best boy, Community education officer for SNF coverage, Other (comments) (Expressive aphasia)    OT      SLP      SW       Team Discharge Planning: Destination: PT-Home ,OT- Home , SLP-Home Projected Follow-up:  PT-24 hour supervision/assistance, Home health PT, Outpatient PT (HHPT vs OPPT tbd), OT-  None, SLP-Outpatient SLP Projected Equipment Needs: PT-To be determined, OT- To be determined, SLP-None recommended by SLP Equipment Details: PT- , OT-  Patient/family involved in discharge planning: PT- Patient,  OT-Patient, SLP-Patient  MD ELOS: 7-10d Medical Rehab Prognosis:  Good Assessment: 87 year old right-handed female with history of T2DM, HTN, hypothyroidism, CKD III who was admitted to [facility] on 10/19/2020 with difficulty walking due to right-sided weakness and sensory deficits as well as word finding deficits that started the day before.  BP elevated at admission patient outside tPA window.  MRI brain done showing subtle diffusion abnormality left basal ganglia with few scattered remote lacunar infarcts.  CTA head neck showed 70% stenosis left carotid bulb with associated severe stenosis at origin of dominant left-VA.  Stroke felt to be likely due to carotid artery stenosis and Dr. Ocie Bob consulted for input but patient had worsening of symptoms with lethargy on 06/27.  Follow-up MRI showed progression of infarct with increasing signs from 5 to 15 mm.  Plavix added to ASA per input from Dr. Otelia Limes.     She has had issues with tachycardia with occasional PACs but no A. Fib--Cardiology felt that stroke due to CAS and recommended CEA when appropriate.  Dr. Wyn Quaker recommended waiting a couple of weeks prior to stenting of L-CEA. Patient with resultant mild dysarthria with word finding deficits and right sided weakness affecting mobility and ADLs. [name] recommended due to functional decline.        See Team Conference Notes for weekly updates to the plan of care

## 2020-10-27 NOTE — Progress Notes (Signed)
Occupational Therapy Session Note  Patient Details  Name: Cassandra Schaefer MRN: [id number] Date of Birth: 02/04/1929  Today's Date: 10/27/2020 OT Individual Time: 3254-9826 OT Individual Time Calculation (min): 45 min    Short Term Goals: Week 1:  OT Short Term Goal 1 (Week 1): STGs=LTGs due to short ELOS  Skilled Therapeutic Interventions/Progress Updates:    Pt received supine, no c/o pain and agreeable to OT session. Pt completed ambulatory transfer into the bathroom with min A. She had mild, but frequent trunk perturbations toward the R with min A. She required min cueing for hand placement during sit > stand. Good RW management during turn to [facility] over toilet. Min A for clothing management and toileting tasks overall. When reaching distally pt had several mild LOB's to the R again, often leaning on the wall. Pt transferred to the w/c and during reach back as she began to descend she had full LOB to the R, requiring max A to regain balance standing. Pt completed oral care seated with min steadying assist to the RUE for bimanual functional manipulation of toothbrush and mouthwash. UB ADLs completed with supervision. Min A for thorough per hygiene in standing. Pt with posterior lean on w/c in standing. Relatively good awareness of deficits, with pt stating "when I reach around too far I lose my balance". Pt able to don underwear and pants sit <> stand with min A for standing balance assist. Pt requested to return to bed. Min A ambulatory transfer with RW. Pt left supine with all needs met, bed alarm set.   Therapy Documentation Precautions:  Precautions Precautions: Fall Precaution Comments: R hemipareisis Restrictions Weight Bearing Restrictions: No   Therapy/Group: Individual Therapy  Curtis Sites 10/27/2020, 6:45 AM

## 2020-10-28 LAB — CBC WITH DIFFERENTIAL/PLATELET
Abs Immature Granulocytes: 0.03 10*3/uL (ref 0.00–0.07)
Basophils Absolute: 0.1 10*3/uL (ref 0.0–0.1)
Basophils Relative: 1 %
Eosinophils Absolute: 0.9 10*3/uL — ABNORMAL HIGH (ref 0.0–0.5)
Eosinophils Relative: 11 %
HCT: 35.2 % — ABNORMAL LOW (ref 36.0–46.0)
Hemoglobin: 11.5 g/dL — ABNORMAL LOW (ref 12.0–15.0)
Immature Granulocytes: 0 %
Lymphocytes Relative: 23 %
Lymphs Abs: 1.9 10*3/uL (ref 0.7–4.0)
MCH: 31 pg (ref 26.0–34.0)
MCHC: 32.7 g/dL (ref 30.0–36.0)
MCV: 94.9 fL (ref 80.0–100.0)
Monocytes Absolute: 0.9 10*3/uL (ref 0.1–1.0)
Monocytes Relative: 11 %
Neutro Abs: 4.3 10*3/uL (ref 1.7–7.7)
Neutrophils Relative %: 54 %
Platelets: 260 10*3/uL (ref 150–400)
RBC: 3.71 MIL/uL — ABNORMAL LOW (ref 3.87–5.11)
RDW: 11.7 % (ref 11.5–15.5)
WBC: 8.1 10*3/uL (ref 4.0–10.5)
nRBC: 0 % (ref 0.0–0.2)

## 2020-10-28 LAB — COMPREHENSIVE METABOLIC PANEL
ALT: 10 U/L (ref 0–44)
AST: 14 U/L — ABNORMAL LOW (ref 15–41)
Albumin: 3.2 g/dL — ABNORMAL LOW (ref 3.5–5.0)
Alkaline Phosphatase: 77 U/L (ref 38–126)
Anion gap: 8 (ref 5–15)
BUN: 36 mg/dL — ABNORMAL HIGH (ref 8–23)
CO2: 24 mmol/L (ref 22–32)
Calcium: 9.2 mg/dL (ref 8.9–10.3)
Chloride: 103 mmol/L (ref 98–111)
Creatinine, Ser: 1.59 mg/dL — ABNORMAL HIGH (ref 0.44–1.00)
GFR, Estimated: 31 mL/min — ABNORMAL LOW (ref >60)
Glucose, Bld: 126 mg/dL — ABNORMAL HIGH (ref 70–99)
Potassium: 3.8 mmol/L (ref 3.5–5.1)
Sodium: 135 mmol/L (ref 135–145)
Total Bilirubin: 0.9 mg/dL (ref 0.3–1.2)
Total Protein: 6.3 g/dL — ABNORMAL LOW (ref 6.5–8.1)

## 2020-10-28 LAB — GLUCOSE, CAPILLARY
Glucose-Capillary: 118 mg/dL — ABNORMAL HIGH (ref 70–99)
Glucose-Capillary: 115 mg/dL — ABNORMAL HIGH (ref 70–99)
Glucose-Capillary: 166 mg/dL — ABNORMAL HIGH (ref 70–99)
Glucose-Capillary: 169 mg/dL — ABNORMAL HIGH (ref 70–99)

## 2020-10-28 NOTE — Progress Notes (Signed)
PROGRESS NOTE   Subjective/Complaints:  Discussed labwork, encourage pt to drink ~46more cups per day    ROS: Patient denies CP, SOB, N/V/D  Objective:   No results found. Recent Labs    10/28/20 0517  WBC 8.1  HGB 11.5*  HCT 35.2*  PLT 260    Recent Labs    10/28/20 0517  NA 135  K 3.8  CL 103  CO2 24  GLUCOSE 126*  BUN 36*  CREATININE 1.59*  CALCIUM 9.2     Intake/Output Summary (Last 24 hours) at 10/28/2020 0653 Last data filed at 10/27/2020 2111 Gross per 24 hour  Intake 1020 ml  Output --  Net 1020 ml         Physical Exam: Vital Signs Blood pressure (!) 136/51, pulse (!) 59, temperature 97.6 F (36.4 C), temperature source Oral, resp. rate 16, height 5\' 3"  (1.6 m), weight 57.6 kg, SpO2 95 %.   General: No acute distress Mood and affect are appropriate Heart: Regular rate and rhythm no rubs murmurs or extra sounds Lungs: Clear to auscultation, breathing unlabored, no rales or wheezes Abdomen: Positive bowel sounds, soft nontender to palpation, nondistended Extremities: No clubbing, cyanosis, or edema   Skin: No evidence of breakdown, no evidence of rash   Neuro: Pt is alert and oriented. Dysarthric speech again, mild word finding deficits as well. Right central 7. RUE 4/5 prox to 3/5 distally. RLE 4-/5.   Sensation per pt fairly equal RIght vs Left  DTR's 1+. Occ word finding issues  Musculoskeletal: Full ROM, No pain with AROM or PROM in the neck, trunk, or extremities. Posture appropriate    Assessment/Plan: 1. Functional deficits which require 3+ hours per day of interdisciplinary therapy in a comprehensive inpatient rehab setting. Physiatrist is providing close team supervision and 24 hour management of active medical problems listed below. Physiatrist and rehab team continue to assess barriers to discharge/monitor patient progress toward functional and medical goals  Care  Tool:  Bathing    Body parts bathed by patient: Right arm, Left arm, Chest, Abdomen, Front perineal area, Right upper leg, Left upper leg, Face, Left lower leg, Right lower leg   Body parts bathed by helper: Buttocks     Bathing assist Assist Level: Minimal Assistance - Patient > 75%     Upper Body Dressing/Undressing Upper body dressing   What is the patient wearing?: Pull over shirt    Upper body assist Assist Level: Supervision/Verbal cueing    Lower Body Dressing/Undressing Lower body dressing      What is the patient wearing?: Pants, Underwear/pull up     Lower body assist Assist for lower body dressing: Minimal Assistance - Patient > 75%     Toileting Toileting    Toileting assist Assist for toileting: Minimal Assistance - Patient > 75%     Transfers Chair/bed transfer  Transfers assist     Chair/bed transfer assist level: Minimal Assistance - Patient > 75%     Locomotion Ambulation   Ambulation assist      Assist level: Minimal Assistance - Patient > 75% Assistive device: Walker-rolling Max distance: 75 ft   Walk 10 feet activity  Assist     Assist level: Minimal Assistance - Patient > 75% Assistive device: Walker-rolling   Walk 50 feet activity   Assist    Assist level: Minimal Assistance - Patient > 75% Assistive device: Walker-rolling    Walk 150 feet activity   Assist Walk 150 feet activity did not occur: Safety/medical concerns         Walk 10 feet on uneven surface  activity   Assist Walk 10 feet on uneven surfaces activity did not occur: Safety/medical concerns         Wheelchair     Assist Will patient use wheelchair at discharge?: No Type of Wheelchair: Manual Wheelchair activity did not occur: N/A         Wheelchair 50 feet with 2 turns activity    Assist    Wheelchair 50 feet with 2 turns activity did not occur: N/A       Wheelchair 150 feet activity     Assist  Wheelchair 150  feet activity did not occur: N/A       Blood pressure (!) 136/51, pulse (!) 59, temperature 97.6 F (36.4 C), temperature source Oral, resp. rate 16, height 5\' 3"  (1.6 m), weight 57.6 kg, SpO2 95 %.  Medical Problem List and Plan: 1.  Functional and mobility deficits secondary to left basal ganglia infarct             -patient may shower             -ELOS/Goals: 7-10 days, mod I goals with PT, OT, SLP  -Continue [name] therapies including PT, OT, and SLP   2.  Antithrombotics: -DVT/anticoagulation:  Pharmaceutical: Lovenox             -antiplatelet therapy: ASA and plavix 3. Pain Management: Tylenol prn  -mild neuropathic pain Right foot--tylenol seems to be covering 4. Mood: LCSW to follow for evaluation and support             -antipsychotic agents: N/AA 5. Neuropsych: This patient is capable of making decisions on her own behalf. 6. Skin/Wound Care: Routine pressure-relief measures 7. Fluids/Electrolytes/Nutrition: Monitor intake/output.   --Check CMet on 07/05 -encourage adequate liquid intake 8.  HTN: Monitor blood pressures 3 times daily.  Avoid hypotension. Vitals:   10/27/20 2029 10/28/20 0503  BP: (!) 137/52 (!) 136/51  Pulse: 65 (!) 59  Resp: 16 16  Temp: 97.8 F (36.6 C) 97.6 F (36.4 C)  SpO2: 98% 95%   In good range 7/5 9.  T2DM: Resume metformin.  Monitor blood sugars ac/hs             --Use SSI for elevated blood sugar and titrate medication as indicated.  CBG (last 3)  Recent Labs    10/27/20 1631 10/27/20 2120 10/28/20 0635  GLUCAP 126* 113* 118*   Controlled 7/5  10. L- CAS: Continue DAPT--Plavix to stop 5 days prior to procedure.             -- Follow-up with Dr. 12/29/20 for surgery on outpatient basis. 11.  CKD: Baseline SCR 1.4-1.7 range             -- Monitor renal status with routine checks.  Cr 1.53 7/1  BUN mildly elevated over baseline, po intake ~1041ml per day will encourage to increase by about 2 cups per day   LOS: 4 days A FACE TO FACE  EVALUATION WAS PERFORMED  80m 10/28/2020, 6:53 AM

## 2020-10-28 NOTE — Progress Notes (Signed)
Occupational Therapy Session Note  Patient Details  Name: Cassandra Schaefer MRN: [id number] Date of Birth: [birth year]  Today's Date: 10/28/2020 OT Individual Time: 1004-1103 OT Individual Time Calculation (min): 59 min    Short Term Goals: Week 1:  OT Short Term Goal 1 (Week 1): STGs=LTGs due to short ELOS  Skilled Therapeutic Interventions/Progress Updates:    Session 1: (1004-1103) Pt completed supine to sit EOB with min guard assist using the bed rails of support.  She was then able to work on picking out her clothes with therapist setting her up with a bag and clothes from her closet.  She then transferred over to the wheelchair at the sink for grooming tasks and for partial washing of hot spots.  She was able to incorporate the RUE as a gross assist with min facilitation at times to help squeeze out the washcloth and for washing and dressing tasks overall.  She was able to wash her face and arms with setup and then complete washing her front and back peri areas with min assist.  She completed donning a pullover shirt with supervision and then needed only min assist for donning underpants with incontinence pad and brief.  She was then able to complete oral hygiene as well as brushing her hair with setup from seated position.  RUE was used for brushing hair using a comb as well with min assist.  Finished with pt sitting in the wheelchair and working on FM coordination task of picking up small foam pieces with the RUE and placing them in a cup from the bedside table.  She was able to complete several with only min guard assist, but had to hold the cup with the LUE to keep it from turning it over.  Call button and phone in reach with safety belt in place.  Session 2: (1355-1445)  Pt up in wheelchair to start session.  Had her complete functional mobility from the chair to the bathroom with use of the RW and min assist.  Mod instructional cueing for positioning of the LUE on the walker.  She was able to  manage clothing and complete toilet hygiene with min assist as well.  Ambulated out to the sink for washing her hands and then back to the chair at the same level.  Took her down to the tub room where she practiced tub shower transfers with use of the tub bench for support.  Discussed tub bench vs tub seat.  Pt's shower is on the second floor and she will need to negotiate a number of steps before being able to use it.  At this time, she will likely sponge bathe and look at seating options after she is home and more able to navigate the steps.  She was able to complete transfer with the RW and the tub bench at min assist level.  She then returned to the room via wheelchair where she worked on using BUEs to fasten her sweater.  She needed increased time secondary to RUE weakness.  Call button and phone in reach with safety belt in place.    Therapy Documentation Precautions:  Precautions Precautions: Fall Precaution Comments: R hemipareisis Restrictions Weight Bearing Restrictions: No  Pain: Pain Assessment Pain Scale: Faces Pain Score: 0-No pain ADL: See Care Tool Section for some details of mobility and selfcare   Therapy/Group: Individual Therapy  Damarrion Mimbs OTR/L 10/28/2020, 12:07 PM

## 2020-10-28 NOTE — Progress Notes (Signed)
Patient information reviewed and entered into [facility] System by Becky Laranda Burkemper, PPS coordinator. Information including medical coding, function ability, and quality indicators will be reviewed and updated through discharge.   

## 2020-10-28 NOTE — Progress Notes (Signed)
Physical Therapy Session Note  Patient Details  Name: Cassandra Schaefer MRN: [id number] Date of Birth: Nov 09, 1928  Today's Date: 10/28/2020 PT Individual Time: 0630-1601 PT Individual Time Calculation (min): 54 min   Short Term Goals: Week 1:  PT Short Term Goal 1 (Week 1): STG = LTG d/t  ELOS  Skilled Therapeutic Interventions/Progress Updates:   Pt received sitting in w/c dowsing off but easily aroused and agreeable to therapy session. Pt reports some fatigue this afternoon. Donned shoes with increased time due to impaired RUE coordination. Sit>stand w/c>RW, cuing for R hand placement on orthotic after standing, with CGA/min assist for steadying - noticed truncal ataxia in standing and pt relying heavily on support from backs of legs against chair for balance due to strong posterior lean.  Gait training ~154ft to main therapy gym using RW with CGA/min assist for steadying - R LE ataxic movements with lack of foot clearance or exaggerated hip/knee flexion (high steppage gait), slow gait speed, and overall increased postural sway - cuing for reciprocal stepping pattern and fluid, continuous forward movement.  Dynamic standing balance and R LE NMR via foot taps on/off 1st 6" step to external target with B UE support progressed to only R UE support with CGA for steadying - demos ataxic R LE movements during this task. Gait training ~34ft 2x using RW with CGA - continued cuing for improved R LE foot clearance and reciprocal stepping pattern - continues with above gait impairments.  Donned maxi-sky harness. Dynamic gait training in maxi-sky harness for safety to allow no UE support, targeting forward gait training with min assist for balance - demos ataxic trunk and R LE movements with varying step length and swing phase mechanics as well as poor balance with increased anterior/posterior lean - cuing throughout for improvement of R LE gait mechanics and education on hip balance recovery strategy. With  increased fatigue pt demos worsening posterior lean in static standing requiring min assist for balance.  Pt reporting fatigue and unable to ambulate back to room, therapist retrieved wheelchair. L stand pivot EOM>w/c, no AD, with light min assist for balance and cuing for upright posture and LE stepping. Transported back to room. R stand pivot w/c>EOB, no AD, as just described. Sit>supine supervision with poor eccentric trunk control. Pt left supine in bed with needs in reach and bed alarm on.  Therapy Documentation Precautions:  Precautions Precautions: Fall Precaution Comments: R hemipareisis Restrictions Weight Bearing Restrictions: No   Pain:  Denies pain during session.    Therapy/Group: Individual Therapy  Ginny Forth , PT, DPT, NCS, CSRS 10/28/2020, 3:52 PM

## 2020-10-28 NOTE — Progress Notes (Signed)
Inpatient Rehabilitation Care Coordinator Assessment and Plan Patient Details  Name: Cassandra Schaefer MRN: [id number] Date of Birth: 1929-02-01  Today's Date: 10/28/2020  Hospital Problems: Principal Problem:   Left basal ganglia embolic stroke [facility])  Past Medical History:  Past Medical History:  Diagnosis Date   Anemia    Cough    RESOLVING, NO FEVER   Diabetes mellitus without complication ([place])    Gout    Hypertension    Hypothyroidism    PUD (peptic ulcer disease)    Wears dentures    full upper, partial lower   Past Surgical History:  Past Surgical History:  Procedure Laterality Date   AMPUTATION TOE Left 12/14/2017   Procedure: AMPUTATION TOE-4TH MPJ;  Surgeon: Samara Deist, DPM;  Location: [facility];  Service: Podiatry;  Laterality: Left;  IVA LOCAL Diabetic - oral meds   APPENDECTOMY     CATARACT EXTRACTION W/PHACO Right 06/23/2015   Procedure: CATARACT EXTRACTION PHACO AND INTRAOCULAR LENS PLACEMENT (IOC);  Surgeon: Estill Cotta, MD;  Location: [facility];  Service: Ophthalmology;  Laterality: Right;  [place] 01:28 AP% 23.4 CDE 36.14 fluid pack lot # [id number]   CATARACT EXTRACTION W/PHACO Left 07/28/2015   Procedure: CATARACT EXTRACTION PHACO AND INTRAOCULAR LENS PLACEMENT (IOC);  Surgeon: Estill Cotta, MD;  Location: [facility];  Service: Ophthalmology;  Laterality: Left;  [place]    1:29.7 AP%  24.9 CDE   41.32 fluid casette lot #[id number] H exp 09/23/2016   COONOSCOPY     AND ENDOSCOPY   DILATION AND CURETTAGE OF UTERUS     TONSILLECTOMY     TUBAL LIGATION     Social History:  reports that she quit smoking about 36 years ago. Her smoking use included cigarettes. She has never used smokeless tobacco. She reports that she does not drink alcohol. No history on file for drug use.  Family / Support Systems Marital Status: Single Children: Systems developer (Daughter) Anticipated Caregiver: [name] and Family Ability/Limitations of Caregiver: Manufacturing engineer at [employer], will arrange  24/7 Caregiver Availability: 24/7 Family Dynamics: has family support  Social History Preferred language: English Religion: Presbyterian Read: Yes Write: Yes Employment Status: Retired Public relations account executive Issues: n/a Guardian/Conservator: n/a   Abuse/Neglect Abuse/Neglect Assessment Can Be Completed: Yes Physical Abuse: Denies Verbal Abuse: Denies Sexual Abuse: Denies Exploitation of patient/patient's resources: Denies Self-Neglect: Denies  Emotional Status Recent Psychosocial Issues: n/a Psychiatric History: n/a Substance Abuse History: n/a  Patient / Family Perceptions, Expectations & Goals Pt/Family understanding of illness & functional limitations: yes Premorbid pt/family roles/activities: previously independent and active, exercising, driving Anticipated changes in roles/activities/participation: daughter able to assist with task. Pt/family expectations/goals: MOD I to Supervision  [employer]: None ([facility]) Premorbid Home Care/DME Agencies: Other (Comment) (single point cane) Transportation available at discharge: daughter able to transport Resource referrals recommended: Neuropsychology  Discharge Planning Living Arrangements: Children Support Systems: Children Type of Residence: Private residence (1 level home, ramp entrance) Insurance Resources: Multimedia programmer (specify) Scientist, clinical (histocompatibility and immunogenetics)) Financial Resources: Family Support Financial Screen Referred: No Living Expenses: Lives with family Money Management: Family Does the patient have any problems obtaining your medications?: No Home Management: independent Patient/Family Preliminary Plans: Daughter able to assisit Care Coordinator Anticipated Follow Up Needs: HH/OP Expected length of stay: 7-10 Days  Clinical Impression Sw met with pt, very pleasant. Introduced self, explained role. Left handout for pt daughter, pt reports she is working and will visit her this evening.  Pt will d/c home back with daughter. No additional questions or concerns,  sw will cont to follow up  Dyanne Iha 10/28/2020, 3:24 PM

## 2020-10-28 NOTE — Progress Notes (Signed)
Patient with drop in creatinine clearance to 31 and has been encouraged to increase fluid intake. I discussed safety of metformin with Pharm D-->Ok to continues at once a day as long as SCr over 30 but recommended d/c if it drops below 30.   Patient has been on metformin PTA and tolerating it without SE.

## 2020-10-28 NOTE — Progress Notes (Signed)
Inpatient Rehabilitation Center Individual Statement of Services  Patient Name:  Cassandra Schaefer  Date:  10/28/2020  Welcome to the Inpatient Rehabilitation Center.  Our goal is to provide you with an individualized program based on your diagnosis and situation, designed to meet your specific needs.  With this comprehensive rehabilitation program, you will be expected to participate in at least 3 hours of rehabilitation therapies Monday-Friday, with modified therapy programming on the weekends.  Your rehabilitation program will include the following services:  Physical Therapy (PT), Occupational Therapy (OT), Speech Therapy (ST), 24 hour per day rehabilitation nursing, Therapeutic Recreaction (TR), Neuropsychology, Care Coordinator, Rehabilitation Medicine, Nutrition Services, Pharmacy Services, and Other  Weekly team conferences will be held on Wednesdays to discuss your progress.  Your Inpatient Rehabilitation Care Coordinator will talk with you frequently to get your input and to update you on team discussions.  Team conferences with you and your family in attendance may also be held.  Expected length of stay: 7-10 Days  Overall anticipated outcome: MOD I to Supervision  Depending on your progress and recovery, your program may change. Your Inpatient Rehabilitation Care Coordinator will coordinate services and will keep you informed of any changes. Your Inpatient Rehabilitation Care Coordinator's name and contact numbers are listed  below.  The following services may also be recommended but are not provided by the Inpatient Rehabilitation Center:   Home Health Rehabiltiation Services Outpatient Rehabilitation Services    Arrangements will be made to provide these services after discharge if needed.  Arrangements include referral to agencies that provide these services.  Your insurance has been verified to be:  [employer] Your primary doctor is:  Barbette Reichmann, MD  Pertinent  information will be shared with your doctor and your insurance company.  Inpatient Rehabilitation Care Coordinator:  Lavera Guise, [place] [phone] or [phone]  Information discussed with and copy given to patient by: Andria Rhein, 10/28/2020, 1:21 PM

## 2020-10-28 NOTE — Progress Notes (Signed)
Speech Language Pathology Daily Session Note  Patient Details  Name: Cassandra Schaefer MRN: [id number] Date of Birth: 07-11-1928  Today's Date: 10/28/2020 SLP Individual Time: 0800-0830 SLP Individual Time Calculation (min): 30 min  Short Term Goals: Week 1: SLP Short Term Goal 1 (Week 1): Patient will produce multisyllabic words and phrases (more than 3 syllables) with 90% accuracy and minA cues. SLP Short Term Goal 2 (Week 1): Patient will demonstrate awareness to phonemic errors during verbal production in structured tasks, with minA cues. SLP Short Term Goal 3 (Week 1): Patient will summarize after reading a short paragraph length text with 85% accuracy and minA cues. SLP Short Term Goal 4 (Week 1): Patient will perform divergent and convergent naming tasks with minA. SLP Short Term Goal 5 (Week 1): Patient will maintain adequate vocal intensity and speech rate during structured oral reading or conversational tasks with minA.  Skilled Therapeutic Interventions: Skilled ST intervention performed with focus on speech and language goals. SLP facilitated verbal expression with comparing and contrasting photo cards with min A verbal cues for contrasting details. Facilitated production of multisyllabic words with >90% intelligibility at word level, and progressed to producing selected multisyllabic words at phrase level with 75% intelligibility and mod A verbal cues to over-articulate, reduce speech rate, and increase vocal intensity. Patient requiring  min-to-mod A verbal cues for awareness of phonemic errors during all speech and language tasks today. Patient was left in bed with alarm activated and needs within reach. Continue per ST POC.    Pain Pain Assessment Pain Scale: 0-10 Pain Score: 0-No pain  Therapy/Group: Individual Therapy  [name] [name] 10/28/2020, 8:11 AM

## 2020-10-29 ENCOUNTER — Telehealth (INDEPENDENT_AMBULATORY_CARE_PROVIDER_SITE_OTHER): Payer: Self-pay

## 2020-10-29 LAB — URINALYSIS, ROUTINE W REFLEX MICROSCOPIC
Bilirubin Urine: NEGATIVE
Glucose, UA: NEGATIVE mg/dL
Hgb urine dipstick: NEGATIVE
Ketones, ur: NEGATIVE mg/dL
Nitrite: NEGATIVE
Protein, ur: NEGATIVE mg/dL
Specific Gravity, Urine: 1.006 (ref 1.005–1.030)
WBC, UA: >50 WBC/hpf — ABNORMAL HIGH (ref 0–5)
pH: 5 (ref 5.0–8.0)

## 2020-10-29 LAB — GLUCOSE, CAPILLARY
Glucose-Capillary: 123 mg/dL — ABNORMAL HIGH (ref 70–99)
Glucose-Capillary: 123 mg/dL — ABNORMAL HIGH (ref 70–99)
Glucose-Capillary: 121 mg/dL — ABNORMAL HIGH (ref 70–99)
Glucose-Capillary: 125 mg/dL — ABNORMAL HIGH (ref 70–99)

## 2020-10-29 NOTE — Progress Notes (Signed)
Occupational Therapy Session Note  Patient Details  Name: Hagen Tidd MRN: [id number] Date of Birth: 1928-11-03  Today's Date: 10/29/2020 OT Individual Time: 1302-1402 OT Individual Time Calculation (min): 60 min    Short Term Goals: Week 1:  OT Short Term Goal 1 (Week 1): STGs=LTGs due to short ELOS  Skilled Therapeutic Interventions/Progress Updates:    Pt in wheelchair to start session with her daughter present.  She requested toileting as well as transfer to the shower.  Min assist for transfer to the 3:1 over the toilet with use of the RW.  Mod demonstrational cueing for hand placement to push up from the arm of the chair for sit to stand.  She was able to complete toilet hygiene and clothing management sit to stand as well as min assist level.  Complete transfer over to the shower at the same level with mod instructional cueing for technique to sit down on the bench and turn around to face the shower.  She completed all bathing with min assist sit to stand and transferred out to the wheelchair with min assist using the RW for dressing at the sink.  Supervision for UB dressing with min assist for LB dressing to donn her underpants and pants.  Min instructional cueing to stabilize one hand on the sink while pulling up garments with the opposite hand.  She was able to donn her slip on shoes with setup as well.  Finished session with completion of gross flexion, tip to tip pinch, and three jaw chuck with use of the therapy putty.  She was able to complete 3-4 repetitions of each exercise.  Left pt up in the wheelchair with the call button and phone in reach and safety belt alarm in place.    Therapy Documentation Precautions:  Precautions Precautions: Fall Precaution Comments: R hemipareisis Restrictions Weight Bearing Restrictions: No  Pain: Pain Assessment Pain Scale: Faces Pain Score: 0-No pain ADL: See Care Tool Section for some details of mobility and selfcare  Therapy/Group:  Individual Therapy  Ahyana Skillin OTR/L 10/29/2020, 3:50 PM

## 2020-10-29 NOTE — Progress Notes (Signed)
Physical Therapy Session Note  Patient Details  Name: Cassandra Schaefer MRN: [id number] Date of Birth: 1929/03/06  Today's Date: 10/29/2020 PT Individual Time: [phone] and 4235-3614 PT Individual Time Calculation (min): 60 min and 42 min  Short Term Goals: Week 1:  PT Short Term Goal 1 (Week 1): STG = LTG d/t  ELOS  Skilled Therapeutic Interventions/Progress Updates:    Session 1: Pt received sitting EOB with RN present for medication administration and NT present to order pt another breakfast. Pt agreeable to therapy session. Sit>stand EOB>RW with min assist - pt continues to rely heavily on backs of legs on bed to maintain balance while coming to stand - continued cuing to push up from bed prior to placing R hand on RW orthotic. Gait training ~50ft x3 in room to/from sink and bathroom using RW with min assist for balance - pt demos even slower gait speed with significantly decreased speed of movement and very small, short step lengths as well as continued minor increased postural sway. Engaged pt in R UE NMR functional tasks including combing hair with pt having difficulty maintaining grasp while moving arm and by opening creamer containers, pt able to complete successfully without spilling 2x. Standing with min assist for balance while pt completed LB clothing management without assist - continent of bladder - seated peri-care without assist. Standing at sink with min assist during hand hygiene, cuing for increased involvement of R UE. Removed R UE RW hand orthotic as pt demos adequate grip strength to maintain grasp during gait. Gait training ~141ft to main therapy gym using RW with min assist for balance - demos even slower gait speed with decreased R LE step length and foot clearance compared to yesterday with heavier reliance on B UE support (maintains good R grasp on RW throughout without orthotic) - cuing throughout for improvement with facilitation at hips for increased R/L weight shifting.  Repeated sit<>stands to/from EOM, no AD but using UE support on mat or LEs as needed, with CGA/min assist for balance focusing on anterior weight shift coming to stand and not using backs of legs on mat for balance - once in standing worked on static balance for 30seconds each time with pt initially unable to maintain balance >3sec prior to posterior lean catching balance via backs of legs on mat advancing to maintaining for 20seconds prior to posterior lean. L stand pivot EOM>w/c, no AD, with min assist for balance. Transported back to room and pt left seated in w/c with needs in reach and seat belt alarm on.   Session 2: Pt received sitting in w/c and agreeable to therapy session.  Transported to/from gym in w/c for time management and energy conservation. Returned R UE RW orthotic. L stand pivot w/c>EOM, no AD but HHA, with CGA/min assist for steadying - cuing for weight shifting and stepping.  Donned maxi-sky harness.  Performed the following dynamic standing balance and dynamic gait training tasks in maxi-sky harness for safety due to increased postural sway and to allow balance recovery strategy retraining: - forward gait training and turning with facilitation for R/L weight shifting onto stance limb ---> repeated again after performing the below pre-gait and R/L weight shifting focused tasks with pt demonstrating significant improvement in R/L weight shift onto stnace limb allowing much improved coordination on swing phase for reciprocal stepping patterns with increased R heel strike and foot clearance as well as decreased postural sway - static standing with pt demoing decreased posterior lean compared to yesterday -  forwards/backwards stepping working on weight shifting and RLE coordination to land heel-to-toe when stepping forward - side stepping in/out   Throughout session, reinforced importance of anterior weight shift during transition sitting>standing to decrease posterior lean and decrease  reliance on backs of legs on mat with pt demonstrating improving balance. R stand pivot EOM>w/c, B HHA, with continued cuing for weight shifting and cuing for increased R lateral step height and length. Transported back to room and pt reports need to use bathroom - left in care of RN to collect urine sample.  Therapy Documentation Precautions:  Precautions Precautions: Fall Precaution Comments: R hemipareisis Restrictions Weight Bearing Restrictions: No   Pain:  Session 1: Denies pain during session.  Session 2: Denies pain during session.   Therapy/Group: Individual Therapy  Ginny Forth , PT, DPT, NCS, CSRS 10/29/2020, 7:46 AM

## 2020-10-29 NOTE — Telephone Encounter (Signed)
Patient is an inpatient at [facility] and has been scheduled for a left CEA with Dr. Wyn Quaker on 11/13/20 at the [facility]. Phone call pre-op is scheduled on 11/06/20 between 1-5 pm. Patient has been cardiac cleared while an inpatient. Pre-surgical instructions will be mailed. I attempted to contact the patient and a message was left for return call.

## 2020-10-29 NOTE — Progress Notes (Signed)
Speech Language Pathology Daily Session Note  Patient Details  Name: Cassandra Schaefer MRN: [id number] Date of Birth: Apr 08, 1929  Today's Date: 10/29/2020 SLP Individual Time: 2500-3704 SLP Individual Time Calculation (min): 40 min  Short Term Goals: Week 1: SLP Short Term Goal 1 (Week 1): Patient will produce multisyllabic words and phrases (more than 3 syllables) with 90% accuracy and minA cues. SLP Short Term Goal 2 (Week 1): Patient will demonstrate awareness to phonemic errors during verbal production in structured tasks, with minA cues. SLP Short Term Goal 3 (Week 1): Patient will summarize after reading a short paragraph length text with 85% accuracy and minA cues. SLP Short Term Goal 4 (Week 1): Patient will perform divergent and convergent naming tasks with minA. SLP Short Term Goal 5 (Week 1): Patient will maintain adequate vocal intensity and speech rate during structured oral reading or conversational tasks with minA.  Skilled Therapeutic Interventions: Skilled treatment session focused on word-finding and speech intelligibility goals. Patient was overall Mod I for word-finding during a complex verbal description task and was 100% intelligible at the sentence level. Patient with intermittent imprecise consonants but self-monitored and corrected errors independently. SLP also introduced diaphragmatic breathing to maximize breath support with Max verbal and tactile cues needed. Patient left upright in wheelchair with alarm on and all needs within reach. Continue with current plan of care.      Pain No/Denies Pain   Therapy/Group: Individual Therapy  Tacey Dimaggio 10/29/2020, 3:05 PM

## 2020-10-29 NOTE — Patient Care Conference (Signed)
Inpatient RehabilitationTeam Conference and Plan of Care Update Date: 10/29/2020   Time: 10:38 AM   Patient Name: Cassandra Schaefer [facility]      Medical Record Number: [id number]  Date of Birth: 26-Jul-1928 Sex: Female         Room/Bed: 4M07C/4M07C-01 Payor Info: Payor: [employer] MEDICARE / Plan: Monia Pouch MEDICARE HMO/PPO / Product Type: *No Product type* /    Admit Date/Time:  10/24/2020 12:58 PM  Primary Diagnosis:  Left basal ganglia embolic stroke [facility])  Hospital Problems: Principal Problem:   Left basal ganglia embolic stroke [facility])    Expected Discharge Date: Expected Discharge Date: 11/07/20  Team Members Present: Physician leading conference: Dr. Claudette Laws Care Coodinator Present: Chana Bode, RN, BSN, CRRN;Christina Vita Barley, BSW Nurse Present: Chana Bode, RN PT Present: Casimiro Needle, PT OT Present: Perrin Maltese, OT SLP Present: Other (comment) Dalbert Mayotte, SLP) PPS Coordinator present : Fae Pippin, SLP     Current Status/Progress Goal Weekly Team Focus  Bowel/Bladder   Patient is continent of bowel and bladder. Complained this morning that she feels burning when urinating.  Patient will remain continent and will not get UTI  Encourage scheduled toileting, increase OFI and check for UTI   Swallow/Nutrition/ Hydration             ADL's   Supervision for UB bathing with min assist for UB dressing.  LB bathing and dressing at min assist as well.  She is able to complete toilet transfers and toileting at min assist with use of the RW and 3:1.  She demonstrates decreased RUE coordination and functional use at Brunnstrum stage IV-V level.  supervision overall  neuromuscular re-education, balance retraining, transfer training, DME education, therapeutic activites, therapeutic exercise   Mobility   CGA bed mobility, min assist sit<>stand and stand pivot transfers using RW, min assist gait up to 138ft using RW, min assist 4 steps using BHRs - significant fall risk due to  significantly impaired standing balance as indicated by 15/56 on Berg Balance Test  supervision overall at ambulatory level  activity tolerance, transfer training, gait training, dynamic standing balance, R LE NMR, pt education, stair navigation   Communication   Sup A for word finding and overall expressive language - progressing well. Min-to-mod A for speech intelligibility and use of strategies at phrase and sentence level  mod I basic expressive, supervision to minA more complex/conversational expressive language, mod I speech intelligiblity  multisylabic word and phrase production with speech intelligibility strategies, phrase level decribing and summarizing   Safety/Cognition/ Behavioral Observations            Pain   Patient is not complaining of pain  Patient will remain pain free  Assess patient for pain and treat as ordered.   Skin   Patient has no skin issues.  Patient will maintain skin integrity  Keep patient's skin clean and dry.     Discharge Planning:  Pt to discharge back home with dauhgter. Able to provide 24/7   Team Discussion: Patient with hx of UTI; feels like she has another brewing, MD checking UA. Encourage fluids. BP stable.  Patient on target to meet rehab goals: yes, currently min assist for upper body bathing and dressing and min - mod assist for lower body care. Note decreased sensation in wrist and right hand but able to transfer with min assist using hand-splint on walker and ambulate 150' with RW with slow propulsion and posterior lean; almost ataxia like pattern with questionable proprioception issues of right  lower extremity. Supervision for work finding and conversation and min - mod assist for awareness. Mod I goals set for communication.   *See Care Plan and progress notes for long and short-term goals.   Revisions to Treatment Plan:    Teaching Needs: Safety, awareness, transfers, gait, medications, secondary risk management, etc.  Current Barriers to  Discharge: Decreased caregiver support and Home enviroment access/layout  Possible Resolutions to Barriers: Family education with daughter     Medical Summary Current Status: Blood pressure starting to stabilize, patient feels like she is getting a UTI.  Barriers to Discharge: Medical stability   Possible Resolutions to Barriers/Weekly Focus: Check UA CNS, multiple drug allergies we will wait for culture results prior to antibiotics unless clinically doing poorly   Continued Need for Acute Rehabilitation Level of Care: The patient requires daily medical management by a physician with specialized training in physical medicine and rehabilitation for the following reasons: Direction of a multidisciplinary physical rehabilitation program to maximize functional independence : Yes Medical management of patient stability for increased activity during participation in an intensive rehabilitation regime.: Yes Analysis of laboratory values and/or radiology reports with any subsequent need for medication adjustment and/or medical intervention. : Yes   I attest that I was present, lead the team conference, and concur with the assessment and plan of the team.   Chana Bode B 10/29/2020, 11:11 AM

## 2020-10-29 NOTE — Progress Notes (Signed)
PROGRESS NOTE   Subjective/Complaints:  Developing some buring with urination, pt states she is prone to bladder infections, no fever or chills .  RLE ataxia noted during gait activity with PT   Allergies to PCN and Sulfa  ROS: Patient denies CP, SOB, N/V/D  Objective:   No results found. Recent Labs    10/28/20 0517  WBC 8.1  HGB 11.5*  HCT 35.2*  PLT 260    Recent Labs    10/28/20 0517  NA 135  K 3.8  CL 103  CO2 24  GLUCOSE 126*  BUN 36*  CREATININE 1.59*  CALCIUM 9.2     Intake/Output Summary (Last 24 hours) at 10/29/2020 0749 Last data filed at 10/28/2020 1836 Gross per 24 hour  Intake 960 ml  Output --  Net 960 ml         Physical Exam: Vital Signs Blood pressure (!) 122/49, pulse 83, temperature 98 F (36.7 C), temperature source Oral, resp. rate 18, height '5\' 3"'  (1.6 m), weight 57.6 kg, SpO2 100 %.  General: No acute distress Mood and affect are appropriate Heart: Regular rate and rhythm no rubs murmurs or extra sounds Lungs: Clear to auscultation, breathing unlabored, no rales or wheezes Abdomen: Positive bowel sounds, soft nontender to palpation, nondistended- no suprapubic tenderness Extremities: No clubbing, cyanosis, or edema Skin: No evidence of breakdown, no evidence of rash  Neuro: Pt is alert and oriented. Dysarthric speech again, mild word finding deficits as well. Right central 7. RUE 4/5 prox to 3/5 distally. RLE 4-/5.   Sensation per pt fairly equal RIght vs Left  DTR's 1+. Occ word finding issues  Musculoskeletal: Full ROM, No pain with AROM or PROM in the neck, trunk, or extremities. Posture appropriate    Assessment/Plan: 1. Functional deficits which require 3+ hours per day of interdisciplinary therapy in a comprehensive inpatient rehab setting. Physiatrist is providing close team supervision and 24 hour management of active medical problems listed below. Physiatrist and  rehab team continue to assess barriers to discharge/monitor patient progress toward functional and medical goals  Care Tool:  Bathing  Bathing activity did not occur: Refused (per OT eval) Body parts bathed by patient: Right arm, Left arm, Face, Front perineal area, Buttocks   Body parts bathed by helper: Buttocks Body parts n/a: Abdomen, Chest, Right upper leg, Left upper leg, Right lower leg, Left lower leg (Did not attempt)   Bathing assist Assist Level: Minimal Assistance - Patient > 75%     Upper Body Dressing/Undressing Upper body dressing   What is the patient wearing?: Pull over shirt (button up sweater)    Upper body assist Assist Level: Minimal Assistance - Patient > 75%    Lower Body Dressing/Undressing Lower body dressing      What is the patient wearing?: Pants, Underwear/pull up     Lower body assist Assist for lower body dressing: Minimal Assistance - Patient > 75%     Toileting Toileting    Toileting assist Assist for toileting: Minimal Assistance - Patient > 75%     Transfers Chair/bed transfer  Transfers assist     Chair/bed transfer assist level: Minimal Assistance - Patient >  75% Chair/bed transfer assistive device: Museum/gallery exhibitions officer assist      Assist level: Minimal Assistance - Patient > 75% Assistive device: Walker-rolling Max distance: 161f   Walk 10 feet activity   Assist     Assist level: Minimal Assistance - Patient > 75% Assistive device: Walker-rolling   Walk 50 feet activity   Assist    Assist level: Minimal Assistance - Patient > 75% Assistive device: Walker-rolling    Walk 150 feet activity   Assist Walk 150 feet activity did not occur: Safety/medical concerns  Assist level: Minimal Assistance - Patient > 75% Assistive device: Walker-rolling    Walk 10 feet on uneven surface  activity   Assist Walk 10 feet on uneven surfaces activity did not occur: Safety/medical  concerns         Wheelchair     Assist Will patient use wheelchair at discharge?: No Type of Wheelchair: Manual Wheelchair activity did not occur: N/A         Wheelchair 50 feet with 2 turns activity    Assist    Wheelchair 50 feet with 2 turns activity did not occur: N/A       Wheelchair 150 feet activity     Assist  Wheelchair 150 feet activity did not occur: N/A       Blood pressure (!) 122/49, pulse 83, temperature 98 F (36.7 C), temperature source Oral, resp. rate 18, height '5\' 3"'  (1.6 m), weight 57.6 kg, SpO2 100 %.  Medical Problem List and Plan: 1.  Functional and mobility deficits secondary to left basal ganglia infarct             -patient may shower             -ELOS/Goals: 7-10 days, mod I goals with PT, OT, SLP  -Continue [name] therapies including PT, OT, and SLP   Team conference today please see physician documentation under team conference tab, met with team  to discuss problems,progress, and goals. Formulized individual treatment plan based on medical history, underlying problem and comorbidities.  2.  Antithrombotics: -DVT/anticoagulation:  Pharmaceutical: Lovenox             -antiplatelet therapy: ASA and plavix 3. Pain Management: Tylenol prn  -mild neuropathic pain Right foot--tylenol seems to be covering 4. Mood: LCSW to follow for evaluation and support             -antipsychotic agents: N/AA 5. Neuropsych: This patient is capable of making decisions on her own behalf. 6. Skin/Wound Care: Routine pressure-relief measures 7. Fluids/Electrolytes/Nutrition: Monitor intake/output.   --Check CMet on 07/05 -encourage adequate liquid intake 8.  HTN: Monitor blood pressures 3 times daily.  Avoid hypotension. Vitals:   10/28/20 1938 10/29/20 0402  BP: (!) 142/48 (!) 122/49  Pulse: 70 83  Resp: 18 18  Temp: 99.1 F (37.3 C) 98 F (36.7 C)  SpO2: 99% 100%   In good range 7/6 9.  T2DM: Resume metformin.  Monitor blood sugars ac/hs              --Use SSI for elevated blood sugar and titrate medication as indicated.  CBG (last 3)  Recent Labs    10/28/20 1648 10/28/20 2118 10/29/20 0641  GLUCAP 166* 169* 123*   Controlled 7/6  10. L- CAS: Continue DAPT--Plavix to stop 5 days prior to procedure.             -- Follow-up with Dr. DLucky Cowboyfor surgery on  outpatient basis. 11.  CKD: Baseline SCR 1.4-1.7 range             -- Monitor renal status with routine checks.  Cr 1.53 7/1  BUN mildly elevated over baseline, po intake ~1066m per day will encourage to increase by about 2 cups per day   LOS: 5 days A FACE TO FTibesE Shawnette Augello 10/29/2020, 7:49 AM

## 2020-10-29 NOTE — Telephone Encounter (Signed)
Patient's daughter called back and all information has been discussed and will be mailed.

## 2020-10-29 NOTE — Progress Notes (Signed)
Patient ID: Cassandra Schaefer, female   DOB: [birth year], 87 y.o.   MRN: [id number]  Team Conference Report to Patient/Family  Team Conference discussion was reviewed with the patient and caregiver, including goals, any changes in plan of care and target discharge date.  Patient and caregiver express understanding and are in agreement.  The patient has a target discharge date of 11/07/20.  SW met with pt, daughter Olivia Mackie) and called [name]) at bedside. Provided conference updates. Encouraging patient to drink more fluids. Pt daughter requesting [place] [facility] due to patient attending their facility previously.   Dyanne Iha 10/29/2020, 1:53 PM

## 2020-10-30 LAB — GLUCOSE, CAPILLARY
Glucose-Capillary: 125 mg/dL — ABNORMAL HIGH (ref 70–99)
Glucose-Capillary: 120 mg/dL — ABNORMAL HIGH (ref 70–99)
Glucose-Capillary: 99 mg/dL (ref 70–99)
Glucose-Capillary: 125 mg/dL — ABNORMAL HIGH (ref 70–99)

## 2020-10-30 NOTE — Progress Notes (Signed)
Occupational Therapy Session Note  Patient Details  Name: Cassandra Schaefer MRN: [id number] Date of Birth: Aug 19, 1928  Today's Date: 10/30/2020 OT Individual Time: 8676-7209 OT Individual Time Calculation (min): 59 min    Short Term Goals: Week 1:  OT Short Term Goal 1 (Week 1): STGs=LTGs due to short ELOS  Skilled Therapeutic Interventions/Progress Updates:    Pt in recliner to start with completion of stand pivot transfer to the wheelchair with min assist.  Mod instructional cueing to scoot out to the edge of the chair before trying to stand in order to help position her feet under her knees and make for smoother UB transition.  She was able to then work on [employer] with use of the ergonometer to start.  She completed 3 sets of 3 mins each.  First set was completed on level 5 resistance using BUEs and RPMs maintained at 25 per minute or greater.  On the 2nd and 3rd sets, she isolated use of the RUE only for 3 mins each and resistance on level 1.  RPMs were then maintained at 18-22 per minute.  She was able to maintain gross grasp in the left hand with out release for all sets.  Next, therapist took her up to the main therapy gym where she completed transfers stand pivot to the therapy mat with min assist and continued work on the RUE for FM coordination.  Had her work on picking up colored pegs and placing in the foam peg board, following a visual diagram.  She was able to retrieve and manipulate the pegs with increased time, but still exhibits episodes of dragging the pegs/hand across the top of the container.  Slight dysmetria noted.  Finished session with return to the wheelchair and pt working on oral hygiene at the sink in standing.  She was able to complete application of toothpaste with min guard assist and brush her teeth at the same level using the electric toothbrush in the right hand.  Finished session with ambulation to the bedside recliner with min assist.  Call button and phone in  reach with safety alarm pad in place.    Therapy Documentation Precautions:  Precautions Precautions: Fall Precaution Comments: R hemipareisis Restrictions Weight Bearing Restrictions: No  Pain: Pain Assessment Pain Scale: 0-10 Pain Score: 0-No pain ADL: See Care Tool Section for some details of mobility and selfcare  Therapy/Group: Individual Therapy  Ineta Sinning OTR/L 10/30/2020, 4:04 PM

## 2020-10-30 NOTE — Progress Notes (Signed)
Speech Language Pathology Daily Session Note  Patient Details  Name: Cassandra Schaefer MRN: [id number] Date of Birth: April 28, 1928  Today's Date: 10/30/2020 SLP Individual Time: 1330-1400 SLP Individual Time Calculation (min): 30 min  Short Term Goals: Week 1: SLP Short Term Goal 1 (Week 1): Patient will produce multisyllabic words and phrases (more than 3 syllables) with 90% accuracy and minA cues. SLP Short Term Goal 2 (Week 1): Patient will demonstrate awareness to phonemic errors during verbal production in structured tasks, with minA cues. SLP Short Term Goal 3 (Week 1): Patient will summarize after reading a short paragraph length text with 85% accuracy and minA cues. SLP Short Term Goal 4 (Week 1): Patient will perform divergent and convergent naming tasks with minA. SLP Short Term Goal 5 (Week 1): Patient will maintain adequate vocal intensity and speech rate during structured oral reading or conversational tasks with minA.  Skilled Therapeutic Interventions: Patient was received in wheelchair and agreeable to participate in skilled ST intervention with focus on speech intelligibility and word finding goals. Patient was overall Mod I for word finding at conversational level and during complex verbal description task. Overall supervision A verbal cues for speech production of multisyllabic words and perceived as 90-100% intelligible at conversational level and displayed increased awareness of errors in order to repair errors effectively. Patient participated in teach back of diaphragmatic breathing exercises learned during previous session which improved speech precision and vocal intensity. Patient reported she doesn't feel as out of breath when engaging in conversation. Patient was left in wheelchair with alarm activated and needs within reach. Continue per ST POC.    Pain Pain Assessment Pain Scale: 0-10 Pain Score: 0-No pain  Therapy/Group: Individual Therapy  [name] 10/30/2020, 3:40 PM

## 2020-10-30 NOTE — Progress Notes (Signed)
PROGRESS NOTE   Subjective/Complaints:  UA + for WBC but rare bact, cx pnd  Allergies to PCN and Sulfa  ROS: Patient denies CP, SOB, N/V/D  Objective:   No results found. Recent Labs    10/28/20 0517  WBC 8.1  HGB 11.5*  HCT 35.2*  PLT 260    Recent Labs    10/28/20 0517  NA 135  K 3.8  CL 103  CO2 24  GLUCOSE 126*  BUN 36*  CREATININE 1.59*  CALCIUM 9.2     Intake/Output Summary (Last 24 hours) at 10/30/2020 0709 Last data filed at 10/29/2020 1854 Gross per 24 hour  Intake 700 ml  Output --  Net 700 ml         Physical Exam: Vital Signs Blood pressure (!) 115/49, pulse 71, temperature 98.2 F (36.8 C), temperature source Oral, resp. rate 17, height 5\' 3"  (1.6 m), weight 57.6 kg, SpO2 97 %.  General: No acute distress Mood and affect are appropriate Heart: Regular rate and rhythm no rubs murmurs or extra sounds Lungs: Clear to auscultation, breathing unlabored, no rales or wheezes Abdomen: Positive bowel sounds, soft nontender to palpation, nondistended Extremities: No clubbing, cyanosis, or edema Skin: No evidence of breakdown, no evidence of rash Neuro: Pt is alert and oriented. Dysarthric speech again, mild word finding deficits as well. Right central 7. RUE 4/5 prox to 3/5 distally. RLE 4-/5.   Sensation per pt fairly equal RIght vs Left  DTR's 1+. Occ word finding issues  Musculoskeletal: Full ROM, No pain with AROM or PROM in the neck, trunk, or extremities. Posture appropriate    Assessment/Plan: 1. Functional deficits which require 3+ hours per day of interdisciplinary therapy in a comprehensive inpatient rehab setting. Physiatrist is providing close team supervision and 24 hour management of active medical problems listed below. Physiatrist and rehab team continue to assess barriers to discharge/monitor patient progress toward functional and medical goals  Care Tool:  Bathing  Bathing  activity did not occur: Refused (per OT eval) Body parts bathed by patient: Right arm, Left arm, Face, Front perineal area, Buttocks, Abdomen, Chest, Left lower leg, Right lower leg, Right upper leg, Left upper leg   Body parts bathed by helper: Buttocks Body parts n/a: Abdomen, Chest, Right upper leg, Left upper leg, Right lower leg, Left lower leg (Did not attempt)   Bathing assist Assist Level: Minimal Assistance - Patient > 75%     Upper Body Dressing/Undressing Upper body dressing   What is the patient wearing?: Pull over shirt    Upper body assist Assist Level: Supervision/Verbal cueing    Lower Body Dressing/Undressing Lower body dressing      What is the patient wearing?: Pants, Underwear/pull up     Lower body assist Assist for lower body dressing: Minimal Assistance - Patient > 75%     Toileting Toileting    Toileting assist Assist for toileting: Minimal Assistance - Patient > 75%     Transfers Chair/bed transfer  Transfers assist     Chair/bed transfer assist level: Minimal Assistance - Patient > 75% Chair/bed transfer assistive device:   Ambulation assist  Assist level: Minimal Assistance - Patient > 75% Assistive device: Walker-rolling Max distance: 151ft   Walk 10 feet activity   Assist     Assist level: Minimal Assistance - Patient > 75% Assistive device: Walker-rolling   Walk 50 feet activity   Assist    Assist level: Minimal Assistance - Patient > 75% Assistive device: Walker-rolling    Walk 150 feet activity   Assist Walk 150 feet activity did not occur: Safety/medical concerns  Assist level: Minimal Assistance - Patient > 75% Assistive device: Walker-rolling    Walk 10 feet on uneven surface  activity   Assist Walk 10 feet on uneven surfaces activity did not occur: Safety/medical concerns         Wheelchair     Assist Will patient use wheelchair at discharge?: No Type  of Wheelchair: Manual Wheelchair activity did not occur: N/A         Wheelchair 50 feet with 2 turns activity    Assist    Wheelchair 50 feet with 2 turns activity did not occur: N/A       Wheelchair 150 feet activity     Assist  Wheelchair 150 feet activity did not occur: N/A       Blood pressure (!) 115/49, pulse 71, temperature 98.2 F (36.8 C), temperature source Oral, resp. rate 17, height 5\' 3"  (1.6 m), weight 57.6 kg, SpO2 97 %.  Medical Problem List and Plan: 1.  Functional and mobility deficits secondary to left basal ganglia infarct             -patient may shower             -ELOS/Goals: 7/15 mod I goals with PT, OT, SLP- discussed date with pt, she is pleased   -Continue [name] therapies including PT, OT, and SLP     2.  Antithrombotics: -DVT/anticoagulation:  Pharmaceutical: Lovenox             -antiplatelet therapy: ASA and plavix 3. Pain Management: Tylenol prn  -mild neuropathic pain Right foot--tylenol seems to be covering 4. Mood: LCSW to follow for evaluation and support             -antipsychotic agents: N/AA 5. Neuropsych: This patient is capable of making decisions on her own behalf. 6. Skin/Wound Care: Routine pressure-relief measures 7. Fluids/Electrolytes/Nutrition: Monitor intake/output.   --Check CMet on 07/05 -encourage adequate liquid intake 8.  HTN: Monitor blood pressures 3 times daily.  Avoid hypotension. Vitals:   10/29/20 1901 10/30/20 0528  BP: (!) 134/51 (!) 115/49  Pulse: 64 71  Resp: 17   Temp: (!) 97.5 F (36.4 C) 98.2 F (36.8 C)  SpO2: 97% 97%   In good range 7/7 9.  T2DM: Resume metformin.  Monitor blood sugars ac/hs             --Use SSI for elevated blood sugar and titrate medication as indicated.  CBG (last 3)  Recent Labs    10/29/20 1628 10/29/20 2110 10/30/20 0614  GLUCAP 121* 125* 125*   Controlled 7/7 10. L- CAS: Continue DAPT--Plavix to stop 5 days prior to procedure.             -- Follow-up  with Dr. 12/31/20 for surgery on outpatient basis. 11.  CKD: Baseline SCR 1.4-1.7 range             -- Monitor renal status with routine checks.  Cr 1.53 7/1  BUN mildly elevated over baseline, po intake ~10100ml per day  will encourage to increase by about 2 cups per day  12.  Dysuria- +WBC but rare bacteria, await cx prior to abx, pt says symptoms improved yesterday  LOS: 6 days A FACE TO FACE EVALUATION WAS PERFORMED  Erick Colace 10/30/2020, 7:09 AM

## 2020-10-30 NOTE — Progress Notes (Signed)
Physical Therapy Session Note  Patient Details  Name: Cassandra Schaefer MRN: [id number] Date of Birth: [birth year]  Today's Date: 10/30/2020 PT Individual Time: 0907-1006 and 1710-1746 PT Individual Time Calculation (min): 59 min and 36 min  Short Term Goals: Week 1:  PT Short Term Goal 1 (Week 1): STG = LTG d/t  ELOS  Skilled Therapeutic Interventions/Progress Updates:    Session 1: Pt received sitting EOB and agreeable to therapy session. Sitting EOB doffed pajamas and donned shirt, shorts, and shoes with set-up assist (except min assist for shoes) and then min assist for balance during sit<>stand transfers without AD. Pt continues to demo posterior lean using backs of legs against bed for balance when coming to stand. Gait training ~128ft to main therapy gym using RW with pt starting out with very short step lengths and decreased foot clearances progressing towards larger steps with increased R LE foot clearance though continues to demo ataxic, uncoordinated movement in R LE as well as minor increased postural sway. Donned maxi-sky harness.   Performed the following dynamic standing balance and dynamic gait training in maxi-sky for safety without UE support:  - forwards ambulation with 180degree turns - requires intermittent min/mod assist for balance recovery due to poor LE foot clearance and step length and varying width of BOS causing LOB and pt having exaggerated balance recovery strategies resulting in worsening lean/LOB - standing with toes on wedge forcing worsening posterior weight shift with focus on learning motor planning to weight shift forward to achieve midline (due to repeated posterior LOB) with pt demoing significant improvement in midline orientation after this - requires min progressed to CGA/close supervision - pre-gait training of forward/backwards stepping targeting full R/L weight shift onto stance limb and improved L LE coordination when stepping - requires min assist for  balance - repeated R LE forward foot taps on/off 4" step targeting L weight shift as pt often states her R LE "feels heavy" and she has "a hard time picking it up" due to lack of full L weight shift - requires min/mod assist for balance during this  Repeated sit<>stands to/from EOM with B UE support as needed focusing on coming to stand without using backs of legs on mat - cuing for increased anterior lean/weight shift while coming to stand - x12 reps. L stand pivot EOM>w/c, no AD, with CGA/min assist for steadying/balance and cuing/facilitation for R/L weight shift to allow steps to complete transfer. Transported back to room. Stand pivot to recliner as just described. Pt left seated in recliner with needs in reach and chair alarm on with her son, Cassandra Schaefer, arriving to visit.  Session 2: Pt received sitting in recliner and agreeable to therapy session. R stand pivot recliner>w/c, no AD, with CGA for steadying, continues to demo slow, hesitant stepping with decreased R/L weight shifting to allow a step along with increased postural sway with posterior bias. Transported to/from gym in w/c for time management and energy conservation. Stepped on/off biodex using B HR support with min assist for balance and cuing for sequencing. Participated in dynamic standing balance using the following biodex settings:  - % weight bearing focusing on midline orientation as pt noted to have excessive posterior lean  - limits of stability working on controlled weight shifting in all directions ensuring proper motor recruitment (not compensating by bending trunk in various directions); pt continues to have most difficulty with the 3 anterior targets  - weight bearing targeting isolated forward/backwards weight shifting focusing on forward lean to  maintain dot over the top line of the target with pt demoing improvement with increased repetitions Gait training ~129ft back to room using RW with CGA/min assist for steadying - starts  with improved reciprocal stepping pattern with increased R LE foot clearance and forward step length but with fatigue starts shuffling R foot and decreasing step lengths bilaterally - noticing fewer instances of high steppage gait pattern. At end of session focused on R UE NMR via functional tasks of opening containers of meal including soda and fruit cup requiring hand-over-hand assistance. Pt left seated in recliner with needs in reach and chair alarm on.  Therapy Documentation Precautions:  Precautions Precautions: Fall Precaution Comments: R hemipareisis Restrictions Weight Bearing Restrictions: No   Pain:  Session 1: No reports of pain throughout session.  Session 2: No reports of pain throughout session.   Therapy/Group: Individual Therapy  Ginny Forth , PT, DPT, NCS, CSRS 10/30/2020, 7:43 AM

## 2020-10-31 LAB — GLUCOSE, CAPILLARY
Glucose-Capillary: 127 mg/dL — ABNORMAL HIGH (ref 70–99)
Glucose-Capillary: 95 mg/dL (ref 70–99)
Glucose-Capillary: 177 mg/dL — ABNORMAL HIGH (ref 70–99)
Glucose-Capillary: 108 mg/dL — ABNORMAL HIGH (ref 70–99)

## 2020-10-31 LAB — BASIC METABOLIC PANEL
Anion gap: 8 (ref 5–15)
BUN: 33 mg/dL — ABNORMAL HIGH (ref 8–23)
CO2: 23 mmol/L (ref 22–32)
Calcium: 9.2 mg/dL (ref 8.9–10.3)
Chloride: 104 mmol/L (ref 98–111)
Creatinine, Ser: 1.67 mg/dL — ABNORMAL HIGH (ref 0.44–1.00)
GFR, Estimated: 29 mL/min — ABNORMAL LOW (ref >60)
Glucose, Bld: 127 mg/dL — ABNORMAL HIGH (ref 70–99)
Potassium: 4 mmol/L (ref 3.5–5.1)
Sodium: 135 mmol/L (ref 135–145)

## 2020-10-31 MED ORDER — SODIUM CHLORIDE 0.9 % IV SOLN
1.0000 g | Freq: Once | INTRAVENOUS | Status: AC
  Administered 2020-10-31: 1 g via INTRAVENOUS
  Filled 2020-10-31: qty 1

## 2020-10-31 NOTE — Progress Notes (Signed)
Physical Therapy Session Note  Patient Details  Name: Cassandra Schaefer MRN: [id number] Date of Birth: 1929-01-31  Today's Date: 10/31/2020 PT Individual Time: 1300-1345 PT Individual Time Calculation (min): 45 min   Short Term Goals: Week 1:  PT Short Term Goal 1 (Week 1): STG = LTG d/t  ELOS  Skilled Therapeutic Interventions/Progress Updates: Pt presents sitting in recliner and ready for therapy.  Pt transfers sit to stand w/ min to CGA w/ verbal cues for hand placement.  Pt amb x 5' to w/c w/ min A.  Pt wheeled to outside for time and energy conservation.  Pt amb multiple trials w/ RW and min A up to 150' on indoor surface and 60' on uneven surfaces outdoors.  Pt amb w/ noted steppage gait on R LE.  Pt veers to R especially on uneven surface.  Pt fatigues and R foot dragging, w/ reminders and then starts to perform steppage gait.  Pt performed standing marching w/ cueing for increased BOS for balance, standing w/o UE support and standing w/ eyes closed x 45 seconds.  Pt performed standing toe taps to 6" platform w/ occasional LOB 2/2 speed.  Pt amb to room and returned to recliner for next therapy.  Seat alarm on and all needs in reach.     Therapy Documentation Precautions:  Precautions Precautions: Fall Precaution Comments: R hemipareisis Restrictions Weight Bearing Restrictions: No General:   Vital Signs:  Pain:0/10 Pain Assessment Pain Scale: 0-10 Pain Score: 0-No pain Mobility:      Therapy/Group: Individual Therapy  Lucio Edward 10/31/2020, 1:46 PM

## 2020-10-31 NOTE — Progress Notes (Addendum)
PROGRESS NOTE   Subjective/Complaints:  Up in bed. No new complaints. Feels that she's getting stronger!  ROS: Patient denies fever, rash, sore throat, blurred vision, nausea, vomiting, diarrhea, cough, shortness of breath or chest pain, joint or back pain, headache, or mood change.   Objective:   No results found. No results for input(s): WBC, HGB, HCT, PLT in the last 72 hours. Recent Labs    10/31/20 0512  NA 135  K 4.0  CL 104  CO2 23  GLUCOSE 127*  BUN 33*  CREATININE 1.67*  CALCIUM 9.2    Intake/Output Summary (Last 24 hours) at 10/31/2020 0953 Last data filed at 10/31/2020 0900 Gross per 24 hour  Intake 480 ml  Output --  Net 480 ml        Physical Exam: Vital Signs Blood pressure (!) 126/50, pulse 63, temperature (!) 97.5 F (36.4 C), temperature source Oral, resp. rate 18, height 5\' 3"  (1.6 m), weight 57.6 kg, SpO2 94 %. Constitutional: No distress . Vital signs reviewed. HEENT: EOMI, oral membranes moist Neck: supple Cardiovascular: RRR without murmur. No JVD    Respiratory/Chest: CTA Bilaterally without wheezes or rales. Normal effort    GI/Abdomen: BS +, non-tender, non-distended Ext: no clubbing, cyanosis, or edema Psych: pleasant and cooperative  Skin: No evidence of breakdown, no evidence of rash Neuro: Pt is alert and oriented. Phonation,speech improving.  Right central 7. RUE 4/5 prox to 3/5 distally. RLE 4-/5.   Sensation per pt fairly equal RIght vs Left  DTR's 1+. Occ word finding issues are improving also. Musculoskeletal: Full ROM, No pain with AROM or PROM in the neck, trunk, or extremities. Posture appropriate    Assessment/Plan: 1. Functional deficits which require 3+ hours per day of interdisciplinary therapy in a comprehensive inpatient rehab setting. Physiatrist is providing close team supervision and 24 hour management of active medical problems listed below. Physiatrist and rehab  team continue to assess barriers to discharge/monitor patient progress toward functional and medical goals  Care Tool:  Bathing  Bathing activity did not occur: Refused (per OT eval) Body parts bathed by patient: Face, Left arm, Right arm, Chest, Abdomen   Body parts bathed by helper: Buttocks Body parts n/a: Front perineal area, Buttocks, Right upper leg, Left upper leg, Left lower leg, Right lower leg (did not attempt this session)   Bathing assist Assist Level: Contact Guard/Touching assist (standing at the sink)     Upper Body Dressing/Undressing Upper body dressing   What is the patient wearing?: Pull over shirt    Upper body assist Assist Level: Supervision/Verbal cueing    Lower Body Dressing/Undressing Lower body dressing      What is the patient wearing?: Pants     Lower body assist Assist for lower body dressing: Minimal Assistance - Patient > 75%     Toileting Toileting    Toileting assist Assist for toileting: Minimal Assistance - Patient > 75%     Transfers Chair/bed transfer  Transfers assist     Chair/bed transfer assist level: Minimal Assistance - Patient > 75% Chair/bed transfer assistive device:  level: Minimal Assistance - Patient > 75% Assistive device: No Device Max distance: 10'   Walk 10 feet activity   Assist     Assist level: Minimal Assistance - Patient > 75% Assistive device: Walker-rolling   Walk 50 feet activity   Assist    Assist level: Minimal Assistance - Patient > 75% Assistive device: Walker-rolling    Walk 150 feet activity   Assist Walk 150 feet activity did not occur: Safety/medical concerns  Assist level: Minimal Assistance - Patient > 75% Assistive device: Walker-rolling    Walk 10 feet on uneven surface  activity   Assist Walk 10 feet on uneven surfaces activity did not occur: Safety/medical concerns          Wheelchair     Assist Will patient use wheelchair at discharge?: No Type of Wheelchair: Manual Wheelchair activity did not occur: N/A         Wheelchair 50 feet with 2 turns activity    Assist    Wheelchair 50 feet with 2 turns activity did not occur: N/A       Wheelchair 150 feet activity     Assist  Wheelchair 150 feet activity did not occur: N/A       Blood pressure (!) 126/50, pulse 63, temperature (!) 97.5 F (36.4 C), temperature source Oral, resp. rate 18, height 5\' 3"  (1.6 m), weight 57.6 kg, SpO2 94 %.  Medical Problem List and Plan: 1.  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  Antithrombotics: -DVT/anticoagulation:  Pharmaceutical: Lovenox             -antiplatelet therapy: ASA and plavix 3. Pain Management: Tylenol prn  -mild neuropathic pain Right foot--tylenol seems to be covering 4. Mood: LCSW to follow for evaluation and support             -antipsychotic agents: N/AA 5. Neuropsych: This patient is capable of making decisions on her own behalf. 6. Skin/Wound Care: Routine pressure-relief measures 7. Fluids/Electrolytes/Nutrition: Monitor intake/output.   --I personally reviewed the patient's labs today.   -encourage adequate liquid intake 8.  HTN: Monitor blood pressures 3 times daily.  Avoid hypotension. Vitals:   10/30/20 2035 10/31/20 0545  BP: (!) 142/51 (!) 126/50  Pulse: 65 63  Resp: 18   Temp: 99.7 F (37.6 C) (!) 97.5 F (36.4 C)  SpO2: 100% 94%   In good range 7/8 9.  T2DM: Resume metformin.  Monitor blood sugars ac/hs             --Use SSI for elevated blood sugar and titrate medication as indicated.  CBG (last 3)  Recent Labs    10/30/20 1635 10/30/20 2139 10/31/20 0542  GLUCAP 99 125* 127*   Controlled 7/8 10. L- CAS: Continue DAPT--Plavix to stop 5 days prior to procedure.             -- Follow-up with Dr. 01/01/21 for surgery on outpatient basis. 11.  CKD: Baseline SCR 1.4-1.7 range, some acute insufficiency superimposed             -BUN/Cr stable to improved from 7/5--continue to encourage fluids 12.  Dysuria- +WBC but  rare bacteria, CX with 100k GNR 7/8   -pt with PCN/sulfa allergies. D/w pharmacy and will give dose of rocephin pending sensitivities  LOS: 7 days A FACE TO FACE EVALUATION WAS PERFORMED  Ranelle Oyster 10/31/2020, 9:53 AM

## 2020-10-31 NOTE — Progress Notes (Signed)
Speech Language Pathology Weekly Progress and Session Note  Patient Details  Name: Cassandra Schaefer MRN: [id number] Date of Birth: 1928-05-08  Beginning of progress report period: October 25, 2020 End of progress report period: October 31, 2020  Today's Date: 10/31/2020 SLP Individual Time: 1000-1100 SLP Individual Time Calculation (min): 60 min  Short Term Goals: Week 1: SLP Short Term Goal 1 (Week 1): Patient will produce multisyllabic words and phrases (more than 3 syllables) with 90% accuracy and minA cues. SLP Short Term Goal 1 - Progress (Week 1): Met SLP Short Term Goal 2 (Week 1): Patient will demonstrate awareness to phonemic errors during verbal production in structured tasks, with minA cues. SLP Short Term Goal 2 - Progress (Week 1): Met SLP Short Term Goal 3 (Week 1): Patient will summarize after reading a short paragraph length text with 85% accuracy and minA cues. SLP Short Term Goal 3 - Progress (Week 1): Met SLP Short Term Goal 4 (Week 1): Patient will perform divergent and convergent naming tasks with minA. SLP Short Term Goal 4 - Progress (Week 1): Met SLP Short Term Goal 5 (Week 1): Patient will maintain adequate vocal intensity and speech rate during structured oral reading or conversational tasks with minA. SLP Short Term Goal 5 - Progress (Week 1): Met  New Short Term Goals: Week 2: SLP Short Term Goal 1 (Week 2): ST=LTGs due to ELOS  Weekly Progress Updates: Patient has made excellent progress and has met 5/5 short-term goals this reporting period due to improved expressive language and motor speech production. Currently, patient is at mod I-to-sup A verbal cues for occasional word finding difficulty at conversational level in which she is able to repair >90% of the time with gestures or verbal description independently. She is currently at supervision level for speech intelligibility at conversational level and during verbal reading tasks, mainly with multisyllabic word  production and repairing errors. She continues to demonstrate increasing awareness of speech errors, displays excellent carryover during session and previous sessions. Her progress has enabled her to more effectively and efficiently communicate functional needs/thoughts/wishes within her current environment. She reports her speech fluctuates during the day especially when she is tired which impacts her ability to articulate herself and engage in meaningful conversation and talk on the phone. She self reports endurance as improving. Patient continues to progress in verbal reading and paragraph summarization with sup A. Patient and Cassandra Schaefer education is ongoing. Patient will continue to benefit from skilled SLP intervention to maximize speech and language function and in order to maximize her functional independence prior to discharge.     Intensity: Minumum of 1-2 x/day, 30 to 90 minutes Frequency: 3 to 5 out of 7 days Duration/Length of Stay: 7/15 Treatment/Interventions: Speech/Language facilitation;Functional tasks;Patient/family education;Cueing hierarchy   Daily Session Skilled Therapeutic Interventions: Patient agreeable to participate in skilled ST intervention on this date with focus on speech and language skills. Facilitated verbal reasoning and word generating activity using novel game "Scattergories" where patient generated words within a chosen category based on selected initial letters. Patient completed task with sup A verbal cues to implement speech intelligibility strategies (I.e., slow rate, articulate each syllable). Patient exhibited minimal-to-no difficulty with word finding during structured tasks. Word finding difficulty occurred x1 at conversational level, although patient was able to independently use gestures and description strategy which helped therapist eventually identify the word patient was searching for. Patient verbally read short paragraph and was able to answer questions  with 90% accuracy and sup A verbal  cues including semantic cues. Patient demonstrated effective self correction to repair articulatory breakdowns and enhance speech intelligibility during verbal reading task. Patient requested to continue with verbal reading tasks, as she feels her current speech impacts her ability to read her bible. Patient was left in her recliner chair with alarm activated and needs within reach. Continue per ST plan of care.   General    Pain Pain Assessment Pain Scale: 0-10 Pain Score: 0-No pain  Therapy/Group: Individual Therapy  Cassandra Schaefer 10/31/2020, 12:46 PM

## 2020-10-31 NOTE — Progress Notes (Signed)
Occupational Therapy Weekly Progress Note  Patient Details  Name: Cassandra Schaefer MRN: [id number] Date of Birth: 08/27/1928  Beginning of progress report period: October 31, 2020 End of progress report period: October 24, 2020  Today's Date: 10/31/2020 OT Individual Time: 6073-7106 OT Individual Time Calculation (min): 40 min    Pt is making great progress with OT at this time.  She is currently supervision for all UB selfcare with min to min guard assist for LB selfcare sit to stand.  She continues to complete functional transfers with use of the RW to the 3:1 at min assist and without RW at [facility] assist.  RUE function continues to improve with pt currently exhibiting Brunnstrum stage V level in the arm and hand.  She is able to use if functionally at an active assist level with supervision for pulling up clothing and washing her left arm.  She still exhibits some slight ataxia with functional reach as well as decreased FM coordination with in-hand manipulation.  Foam grips are being used over her utensils for self feeding with supervision when using the RUE as well.  She continues with RLE weakness as well overall with decreased knee and hip control as well as decreased ability to efficiently advance the LLE during swing phase.  Feel overall she is making steady progress with OT at this time toward established LTGs set at supervision level overall with expected discharge date of 11/07/20.  Recommend continued [name] to progress to this level.   Patient continues to demonstrate the following deficits: muscle weakness, muscle joint tightness, and muscle paralysis, impaired timing and sequencing, unbalanced muscle activation, and decreased coordination, and decreased standing balance, decreased postural control, hemiplegia, and decreased balance strategies and therefore will continue to benefit from skilled OT intervention to enhance overall performance with BADL and Reduce care partner burden.  Patient progressing  toward long term goals..  Continue plan of care.  OT Short Term Goals Week 2:  OT Short Term Goal 1 (Week 2): Continue working on established goals at supervision level.  Skilled Therapeutic Interventions/Progress Updates:    Pt in recliner to start session, accidentally spilling her drink.  Provided her with towel to wipe up spill using the RUE.  She then completed functional mobility from the recliner to the ortho gym without use of an assistive device and overall mod assist.  Decreased RLE clearance with mobility as well as slight ataxia in her trunk with posterior bias at times.  Once in the gym, focused on RUE and RLE neuromuscular re-education with use of a basketball to start in sitting.  Had her work on holding the ball with BUEs, making sure to maintain right shoulder flexion and right elbow extension in order to mirror the LUE.  She was able to complete and bring the ball to overhead and maintain with min facilitation at times at the right elbow.  Progressed to working on holding the ball during sit to stand transitions with min assist as well with focus on slower transitions as she tends to exhibit decreased timing of hip and knee extension.  Worked on small movements in squat to stand position at a slower level while holding the ball with min facilitation for balance.  Finished with work on squat to stand while picking up poker chips with the right hand and placing in a cup.  Also had her complete in-hand manipulation as well, picking up two poker chips, one at a time and trying to translate from finger tip to  palm while resting in sitting.  She was able to complete with 50% accuracy.  Had her ambulate back to the room at mod assist level where she was positioned in the recliner with the call button and phone in reach.  Safety alarm pad in place as well.   Therapy Documentation Precautions:  Precautions Precautions: Fall Precaution Comments: R hemipareisis Restrictions Weight Bearing  Restrictions: No   Pain: Pain Assessment Pain Scale: Faces Pain Score: 0-No pain ADL: See Care Tool Section  Therapy/Group: Individual Therapy  Afreen Siebels OTR/L 10/31/2020, 4:00 PM

## 2020-10-31 NOTE — Progress Notes (Signed)
Occupational Therapy Session Note  Patient Details  Name: Cassandra Schaefer MRN: [id number] Date of Birth: 04-04-1929  Today's Date: 10/31/2020 OT Individual Time: 1115-1200 OT Individual Time Calculation (min): 45 min    Short Term Goals: Week 1:  OT Short Term Goal 1 (Week 1): STGs=LTGs due to short ELOS   Skilled Therapeutic Interventions/Progress Updates:    Pt sitting up in recliner, no c/o pain, reports the coordination in her right arm has improved quite a bit but she still finds herself using her left dominant hand to compensate because its difficult to hold/maneuver items like her toothpaste.    Stand pivot recliner to w/c without AD needing CGA for stand and pivot and then min assist for stand to sit to slow descent. Pt transported to gym for time mgt.  Pt participated in standing checkers activity to promote gross and fine motor control of RUE.  Pt placed pieces on board needing increased time for precision pinch and place.  Pt needed CGA for sit to stand and static standing.  Min assist for stand to sit and Vcs to ensure pt could feel BLE on w/c seat prior to sitting and also to reach back with BUE for armrests to slow descent.  Able to fade Vcs and noted pt self initiating safe transfer techniques during next two sit<>stands.  Pt exhibited good sustained and selective attention and also problem solving and reasoning to achieve object of game.  Pt transported to room and completed stand pivot with close supervision.  Call bell in reach, seat alarm on.   Therapy Documentation Precautions:  Precautions Precautions: Fall Precaution Comments: R hemipareisis Restrictions Weight Bearing Restrictions: No    Therapy/Group: Individual Therapy  Amie Critchley 10/31/2020, 2:03 PM

## 2020-10-31 NOTE — Progress Notes (Signed)
Occupational Therapy Session Note  Patient Details  Name: Cassandra Schaefer MRN: [id number] Date of Birth: August 22, 1928  Today's Date: 10/31/2020 OT Individual Time: 1191-4782 OT Individual Time Calculation (min): 28 min    Short Term Goals: Week 1:  OT Short Term Goal 1 (Week 1): STGs=LTGs due to short ELOS  Skilled Therapeutic Interventions/Progress Updates:    Pt in recliner to start requesting to brush her teeth, wash her face, and donn new clothing.  She was able to ambulate to the sink without an assistive device at min assist.  Decreased right hip stability as well as decreased efficiency with advancing the right foot.  She was able to complete oral hygiene and UB bathing in standing with min guard at the sink with UB dressing completed in sitting.  Min instructional cueing for hand placement with sit to stand and to be sure and push up from the wheelchair instead of trying to pull on the sink.  RUE used throughout tasks as an active assist at supervision level.  Difficulty noted with placing toothbrush upright as well as when attempting to squeeze out lotion from the bottle.  She was able to donn her shorts with min guard sit to stand as well as donning her gripper socks with supervision.  Finished session with pt sitting up in the wheelchair and with the call button and phone in reach in preparation for next session.    Therapy Documentation Precautions:  Precautions Precautions: Fall Precaution Comments: R hemipareisis Restrictions Weight Bearing Restrictions: No  Pain: Pain Assessment Pain Scale: 0-10 Pain Score: 0-No pain ADL: See Care Tool Section for some details of mobility and selfcare  Therapy/Group: Individual Therapy  Merrianne Mccumbers OTR/L 10/31/2020, 9:51 AM

## 2020-11-01 LAB — GLUCOSE, CAPILLARY
Glucose-Capillary: 111 mg/dL — ABNORMAL HIGH (ref 70–99)
Glucose-Capillary: 119 mg/dL — ABNORMAL HIGH (ref 70–99)
Glucose-Capillary: 216 mg/dL — ABNORMAL HIGH (ref 70–99)
Glucose-Capillary: 58 mg/dL — ABNORMAL LOW (ref 70–99)
Glucose-Capillary: 85 mg/dL (ref 70–99)

## 2020-11-01 LAB — URINE CULTURE: Culture: >=100000 — AB

## 2020-11-01 NOTE — Progress Notes (Signed)
Physical Therapy Weekly Progress Note  Patient Details  Name: Cassandra Schaefer MRN: [id number] Date of Birth: November 19, 1928  Beginning of progress report period: October 25, 2020 End of progress report period: November 01, 2020  Today's Date: 11/01/2020 PT Individual Time: 0910-1010 PT Individual Time Calculation (min): 60 min   Patient has met 0 of 1 short term goals due to longer LOS than originally expected there were no STGs created. Cassandra Schaefer is progressing well with therapy demonstrating increasing independence with functional mobility tasks. She continues to demonstrate R LE paresis with impaired proprioception and coordination as well as overall truncal ataxia with significant balance impairments that are improving. She is performing supine<>sit with supervision, sit<>stand and stand pivot transfers using RW with CGA (reliance on B UE and legs against backs of seats for balance), and ambulating up to 132f using RW with CGA/min assist for balance. She demonstrates poor R LE foot clearance and step length during swing with intermittent high steppage gait pattern. She will benefit from continued [name] level therapies to address these impairments prior to D/C home with 24hr support in order to decrease fall risk and increase pt independence.   Patient continues to demonstrate the following deficits muscle weakness, decreased cardiorespiratoy endurance, impaired timing and sequencing, unbalanced muscle activation, decreased coordination, and decreased motor planning,  , decreased initiation, decreased attention, decreased problem solving, and delayed processing, and decreased standing balance, decreased postural control, and decreased balance strategies and therefore will continue to benefit from skilled PT intervention to increase functional independence with mobility.  Patient progressing toward long term goals..  Continue plan of care.  PT Short Term Goals Week 1:  PT Short Term Goal 1 (Week 1): STG = LTG  d/t  ELOS PT Short Term Goal 1 - Progress (Week 1): Progressing toward goal Week 2:  PT Short Term Goal 1 (Week 2): = to LTGs based on ELOS  Skilled Therapeutic Interventions/Progress Updates:  Ambulation/gait training;Discharge planning;Balance/vestibular training;DME/adaptive equipment instruction;Functional mobility training;Neuromuscular re-education;Pain management;Patient/family education;Psychosocial support;Stair training;Therapeutic Activities;Therapeutic Exercise;UE/LE Strength taining/ROM;UE/LE Coordination activities;Visual/perceptual remediation/compensation;Wheelchair propulsion/positioning;Community reintegration;Skin care/wound management;Disease management/prevention   Pt received supine in bed and agreeable to therapy session. Supine>sitting R EOB, HOB slightly elevated but not using bedrails, supervision. Sitting EOB doffed socks and donned shoes set-up assist, demos improving coordination of B UEs to perform this task requiring less time. Donned jacket targeting B UE NMR and coordination with pt requiring 3 attempts and min assist to get it over her shoulders. Sit>stand EOB>RW with CGA and despite attempt, pt still leans backs of legs against bed for balance while coming up to stand. Gait training ~2065fusing RW with CGA/slight min assist towards end due to decreasing R LE foot clearance with fatigue - in the beginning pt demos significant improvement in R LE coordination of gait mechanics with good foot clearance without exaggerated high steppage gait pattern with reciprocal stepping - continues to have decreased gait speed but is improving.   Repeated sit<>stands to/from EOM with B UE support on knees 2x12 reps - cuing for proper R LE foot placement, knees abducted to increase hip abductor activation, increased anterior trunk lean, and increased hip extension while rising - pt demos significantly improved midline orientation with decreased posterior lean that worsens during 2nd set  due to fatigue - slight L LE weight shift bias but with cuing is able to improve to increase R LE WBing.   Dynamic standing and R LE NMR for coordination task via repeated R or L foot  taps on/off 4" step, no UE support, mirror feedback- requires min to mod assist for balance when LOB occurs - demos improved ability to tap L foot and maintain balance with only CGA/min assist but has increased difficulty coordinating R LE foot taps with most challenge lifting it to bring it back off the step requiring up to mod assist for balance. Progressed to alternating foot taps with pt demoing significant improvement in ability to weight shift and maintain balance on stance limb while tapping opposite foot onto step; however, continues to have more impaired balance when tapping R foot onto step.   Gait training ~85f, ~311f no AD, with min assist for balance - pt demos very guarded trunk and arm posturing with very slow, intentional movements due to impaired balance - cuing for improved weight shift onto stance limb and increased R LE foot clearance with forward advancement (sometimes pt will excessively lift R LE during swing but not advance it forward).  Ascended/descended 4 steps using R HR only to simulate home environment with step-to pattern each direction and min assist for balance - requires UE support on therapist during decent as well.  Gait training ~15052fack to room using RW with min assist for balance as pt demoing worsening R LE foot clearance and step length due to fatigue resulting in worsening balance - facilitation/cuing for increased R foot clearance and step length. At end of session pt left supine in bed with needs in reach and bed alarm on.  Therapy Documentation Precautions:  Precautions Precautions: Fall Precaution Comments: R hemipareisis Restrictions Weight Bearing Restrictions: No   Pain:  Denies pain during session.  Therapy/Group: Individual Therapy  CarTawana ScalePT,  DPT, NCS, CSRS 11/01/2020, 7:50 AM

## 2020-11-01 NOTE — Progress Notes (Signed)
Occupational Therapy Session Note  Patient Details  Name: Cassandra Schaefer MRN: [id number] Date of Birth: 07-12-1928  Today's Date: 11/01/2020 OT Individual Time: 1300-1357 OT Individual Time Calculation (min): 57 min    Short Term Goals: Week 1:  OT Short Term Goal 1 (Week 1): STGs=LTGs due to short ELOS  Skilled Therapeutic Interventions/Progress Updates:    Pt received in bed and consented to OT tx. Pt transferred from EOB to w/c SPT with CGA. Pt wheeled down to therapy gym where session focused on fine motor control and coordination activities. Pt instructed in small tack and cork board tasks, instructed to alternate between R and L hands for increase fine motor for ADLs. Pt able to complete activity with increased time, cuing for proper technique. After tall tacks in place in cork board, pt instructed to take out tacks and place them back in container using yellow clothespin to increase pinch and intrinsic hand strength and RUE coordination. Activity took entirety of session as pt's movements are slow. After tx, pt taken back to room, transferred to recliner with CGA. Pt left up in recliner with family present, chair alarm on, all needs met.  Therapy Documentation Precautions:  Precautions Precautions: Fall Precaution Comments: R hemipareisis Restrictions Weight Bearing Restrictions: No   Pain: none    Therapy/Group: Individual Therapy  Jemmie Rhinehart 11/01/2020, 1:57 PM

## 2020-11-01 NOTE — Progress Notes (Signed)
PROGRESS NOTE   Subjective/Complaints: No complaints this morning Tells me about her 7 children and 40+ grandchildren, and her 89th birthday celebration!  ROS: Patient denies fever, rash, sore throat, blurred vision, nausea, vomiting, diarrhea, cough, shortness of breath or chest pain, joint or back pain, headache, or mood change.   Objective:   No results found. No results for input(s): WBC, HGB, HCT, PLT in the last 72 hours. Recent Labs    10/31/20 0512  NA 135  K 4.0  CL 104  CO2 23  GLUCOSE 127*  BUN 33*  CREATININE 1.67*  CALCIUM 9.2    Intake/Output Summary (Last 24 hours) at 11/01/2020 1341 Last data filed at 11/01/2020 0756 Gross per 24 hour  Intake 600 ml  Output --  Net 600 ml        Physical Exam: Vital Signs Blood pressure (!) 147/58, pulse 64, temperature 98.2 F (36.8 C), resp. rate 16, height 5\' 3"  (1.6 m), weight 57.6 kg, SpO2 96 %. Gen: no distress, normal appearing HEENT: oral mucosa pink and moist, NCAT Cardio: Reg rate Chest: normal effort, normal rate of breathing Abd: soft, non-distended Ext: no edema Psych: pleasant, normal affect Skin: intact  Neuro: Pt is alert and oriented. Phonation,speech improving.  Right central 7. RUE 4/5 prox to 3/5 distally. RLE 4-/5.   Sensation per pt fairly equal RIght vs Left  DTR's 1+. Occ word finding issues are improving also. Musculoskeletal: Full ROM, No pain with AROM or PROM in the neck, trunk, or extremities. Posture appropriate    Assessment/Plan: 1. Functional deficits which require 3+ hours per day of interdisciplinary therapy in a comprehensive inpatient rehab setting. Physiatrist is providing close team supervision and 24 hour management of active medical problems listed below. Physiatrist and rehab team continue to assess barriers to discharge/monitor patient progress toward functional and medical goals  Care Tool:  Bathing  Bathing  activity did not occur: Refused (per OT eval) Body parts bathed by patient: Face, Left arm, Right arm, Chest, Abdomen   Body parts bathed by helper: Buttocks Body parts n/a: Front perineal area, Buttocks, Right upper leg, Left upper leg, Left lower leg, Right lower leg (did not attempt this session)   Bathing assist Assist Level: Contact Guard/Touching assist (standing at the sink)     Upper Body Dressing/Undressing Upper body dressing   What is the patient wearing?: Pull over shirt    Upper body assist Assist Level: Supervision/Verbal cueing    Lower Body Dressing/Undressing Lower body dressing      What is the patient wearing?: Pants     Lower body assist Assist for lower body dressing: Minimal Assistance - Patient > 75%     Toileting Toileting    Toileting assist Assist for toileting: Minimal Assistance - Patient > 75%     Transfers Chair/bed transfer  Transfers assist     Chair/bed transfer assist level: Minimal Assistance - Patient > 75% Chair/bed transfer assistive device:   Ambulation assist      Assist level: Moderate Assistance - Patient 50 - 74% (no assistive device) Assistive device: No Device Max distance: 75'   Walk 10 feet  activity   Assist     Assist level: Minimal Assistance - Patient > 75% Assistive device: Walker-rolling   Walk 50 feet activity   Assist    Assist level: Minimal Assistance - Patient > 75% Assistive device: Walker-rolling    Walk 150 feet activity   Assist Walk 150 feet activity did not occur: Safety/medical concerns  Assist level: Minimal Assistance - Patient > 75% Assistive device: Walker-rolling    Walk 10 feet on uneven surface  activity   Assist Walk 10 feet on uneven surfaces activity did not occur: Safety/medical concerns   Assist level: Moderate Assistance - Patient - 50 - 74% Assistive device: Photographer Will patient use  wheelchair at discharge?: No Type of Wheelchair: Manual Wheelchair activity did not occur: N/A         Wheelchair 50 feet with 2 turns activity    Assist    Wheelchair 50 feet with 2 turns activity did not occur: N/A       Wheelchair 150 feet activity     Assist  Wheelchair 150 feet activity did not occur: N/A       Blood pressure (!) 147/58, pulse 64, temperature 98.2 F (36.8 C), resp. rate 16, height 5\' 3"  (1.6 m), weight 57.6 kg, SpO2 96 %.  Medical Problem List and Plan: 1.  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  Impaired mobility, ambulating 150 feet, d/c Lovenox             -antiplatelet therapy: ASA and plavix 3. Mild right sided neuropathic pain: Continue Tylenol prn 4. Mood: LCSW to follow for evaluation and support             -antipsychotic agents: N/AA 5. Neuropsych: This patient is capable of making decisions on her own behalf. 6. Skin/Wound Care: Routine pressure-relief measures 7. Fluids/Electrolytes/Nutrition: Monitor intake/output.   --I personally reviewed the patient's labs today.   -encourage adequate liquid intake 8.  HTN: Monitor blood pressures 3 times daily.  Avoid hypotension. Vitals:   10/31/20 1934 11/01/20 0552  BP: (!) 133/58 (!) 147/58  Pulse: 68 64  Resp: 18 16  Temp: 99 F (37.2 C) 98.2 F (36.8 C)  SpO2: 97% 96%   In good range 7/9 9.  T2DM: Continue metformin.  Monitor blood sugars ac/hs             --Use SSI for elevated blood sugar and titrate medication as indicated.  CBG (last 3)  Recent Labs    10/31/20 2124 11/01/20 0551 11/01/20 1138  GLUCAP 108* 111* 119*  Controlled 7/9 10. L- CAS: Continue DAPT--Plavix to stop 5 days prior to procedure.             -- Follow-up with Dr. 01/02/21 for  surgery on outpatient basis. 11.  CKD: Baseline SCR 1.4-1.7 range, some acute insufficiency superimposed             -BUN/Cr stable to improved from 7/5--continue to encourage fluids 12.  Dysuria- +WBC but rare bacteria, CX with 100k GNR 7/8  -pt with PCN/sulfa allergies. D/w pharmacy and will give dose of  rocephin, sensitive LOS: 8 days A FACE TO FACE EVALUATION WAS PERFORMED  Clint Bolder P Channel Papandrea 11/01/2020, 1:41 PM

## 2020-11-02 LAB — GLUCOSE, CAPILLARY
Glucose-Capillary: 109 mg/dL — ABNORMAL HIGH (ref 70–99)
Glucose-Capillary: 107 mg/dL — ABNORMAL HIGH (ref 70–99)
Glucose-Capillary: 121 mg/dL — ABNORMAL HIGH (ref 70–99)
Glucose-Capillary: 141 mg/dL — ABNORMAL HIGH (ref 70–99)

## 2020-11-02 NOTE — Progress Notes (Signed)
Occupational Therapy Session Note  Patient Details  Name: Cassandra Schaefer MRN: [id number] Date of Birth: 07-25-1928  Today's Date: 11/02/2020 OT Individual Time: 1300-1400 OT Individual Time Calculation (min): 60 min    Short Term Goals: Week 1:  OT Short Term Goal 1 (Week 1): STGs=LTGs due to short ELOS  Skilled Therapeutic Interventions/Progress Updates:    1:1 Pt received in bed finishing lunch. PT able to come to EOB with supervision and don shoes.  Pt reports she would like to continuing  to focus on her [facility] skills in her right hand. NMR while engaging in manipulating small screws and bolts with and without screw driver. Pt required extra time but able to complete all tasks. Also engaged in exercises with right hand with yellow therapy putty; including extracting small items from putty. Then engaged in game of dominoes (a game she participates in weekly with friends ) with focus on use of right hand as non dominant level.   Therapy Documentation Precautions:  Precautions Precautions: Fall Precaution Comments: R hemipareisis Restrictions Weight Bearing Restrictions: No  Pain:  No  indications of pain    Therapy/Group: Individual Therapy  Roney Mans [facility] 11/02/2020, 1:52 PM

## 2020-11-03 LAB — GLUCOSE, CAPILLARY
Glucose-Capillary: 111 mg/dL — ABNORMAL HIGH (ref 70–99)
Glucose-Capillary: 101 mg/dL — ABNORMAL HIGH (ref 70–99)
Glucose-Capillary: 108 mg/dL — ABNORMAL HIGH (ref 70–99)
Glucose-Capillary: 119 mg/dL — ABNORMAL HIGH (ref 70–99)

## 2020-11-03 MED ORDER — CEPHALEXIN 250 MG PO CAPS
250.0000 mg | ORAL_CAPSULE | Freq: Three times a day (TID) | ORAL | Status: DC
  Administered 2020-11-03 – 2020-11-07 (×13): 250 mg via ORAL
  Filled 2020-11-03 (×13): qty 1

## 2020-11-03 NOTE — Progress Notes (Signed)
Occupational Therapy Session Note  Patient Details  Name: Cassandra Schaefer MRN: [id number] Date of Birth: 1928/04/30  Today's Date: 11/03/2020 OT Individual Time: 1937-9024 OT Individual Time Calculation (min): 45 min    Short Term Goals: Week 1:  OT Short Term Goal 1 (Week 1): STGs=LTGs due to short ELOS Week 2:  OT Short Term Goal 1 (Week 2): Continue working on established goals at supervision level.    Skilled Therapeutic Interventions/Progress Updates:    Pt greeted at time of session sitting up in recliner agreeable to OT session, no pain. Pt was fatigued but willing to try to participate. Pleasant and cooperative throughout. Ambulated short distance around room chair <> wheelchair with close supervision/CGA with RW and no lean noted today with short distance. Transported to ortho gym and worked on [employer] eye coordination per pt request by performing 3 rounds of BITS of the following: circle hits for 3 mins on size 5 with 63% accuracy, 30 word finds with 83% accuracy on size 8, and visual scanning red/white/blue dots on size 4 with speed 5 with 42% accuracy as a challenge. Pt stating she liked this activity. Transported back to room and ambulated as above, alarm on call bell in reach.    Therapy Documentation Precautions:  Precautions Precautions: Fall Precaution Comments: R hemipareisis Restrictions Weight Bearing Restrictions: No     Therapy/Group: Individual Therapy  Erasmo Score 11/03/2020, 12:25 PM

## 2020-11-03 NOTE — Progress Notes (Signed)
PROGRESS NOTE   Subjective/Complaints:  No issues overnite   ROS: Patient denies CP SOB N/V/D Objective:   No results found. No results for input(s): WBC, HGB, HCT, PLT in the last 72 hours. No results for input(s): NA, K, CL, CO2, GLUCOSE, BUN, CREATININE, CALCIUM in the last 72 hours.   Intake/Output Summary (Last 24 hours) at 11/03/2020 0734 Last data filed at 11/02/2020 1858 Gross per 24 hour  Intake 480 ml  Output --  Net 480 ml         Physical Exam: Vital Signs Blood pressure (!) 136/53, pulse 64, temperature 98.4 F (36.9 C), temperature source Oral, resp. rate 18, height 5\' 3"  (1.6 m), weight 57.6 kg, SpO2 95 %.  General: No acute distress Mood and affect are appropriate Heart: Regular rate and rhythm no rubs murmurs or extra sounds Lungs: Clear to auscultation, breathing unlabored, no rales or wheezes Abdomen: Positive bowel sounds, soft nontender to palpation, nondistended Extremities: No clubbing, cyanosis, or edema Skin: No evidence of breakdown, no evidence of rash Neurologic: Cranial nerves II through XII intact, motor strength is 5/5 in bilateral deltoid, bicep, tricep, grip, hip flexor, knee extensors, ankle dorsiflexor and plantar flexor Sensory exam normal sensation to light touch and proprioception in bilateral upper and lower extremities Cerebellar exam normal finger to nose to finger as well as heel to shin in bilateral upper and lower extremities Musculoskeletal: Full range of motion in all 4 extremities. No joint swelling   Neuro: Pt is alert and oriented. Phonation,speech improving.  Right central 7. RUE 4/5 all muscle groups  RLE 4-/5.   Sensation equal,  Cerebellar- dysmetria RUE and RLE Musculoskeletal: Full ROM, No pain with AROM or PROM in the neck, trunk, or extremities. Posture appropriate    Assessment/Plan: 1. Functional deficits which require 3+ hours per day of  interdisciplinary therapy in a comprehensive inpatient rehab setting. Physiatrist is providing close team supervision and 24 hour management of active medical problems listed below. Physiatrist and rehab team continue to assess barriers to discharge/monitor patient progress toward functional and medical goals  Care Tool:  Bathing  Bathing activity did not occur: Refused (per OT eval) Body parts bathed by patient: Face, Left arm, Right arm, Chest, Abdomen   Body parts bathed by helper: Buttocks Body parts n/a: Front perineal area, Buttocks, Right upper leg, Left upper leg, Left lower leg, Right lower leg (did not attempt this session)   Bathing assist Assist Level: Contact Guard/Touching assist (standing at the sink)     Upper Body Dressing/Undressing Upper body dressing   What is the patient wearing?: Pull over shirt    Upper body assist Assist Level: Supervision/Verbal cueing    Lower Body Dressing/Undressing Lower body dressing      What is the patient wearing?: Pants     Lower body assist Assist for lower body dressing: Minimal Assistance - Patient > 75%     Toileting Toileting    Toileting assist Assist for toileting: Minimal Assistance - Patient > 75%     Transfers Chair/bed transfer  Transfers assist     Chair/bed transfer assist level: Minimal Assistance - Patient > 75% Chair/bed transfer assistive device:  Walker   Locomotion Ambulation   Ambulation assist      Assist level: Minimal Assistance - Patient > 75% Assistive device: Walker-rolling Max distance: 134ft   Walk 10 feet activity   Assist     Assist level: Minimal Assistance - Patient > 75% Assistive device: Walker-rolling   Walk 50 feet activity   Assist    Assist level: Minimal Assistance - Patient > 75% Assistive device: Walker-rolling    Walk 150 feet activity   Assist Walk 150 feet activity did not occur: Safety/medical concerns  Assist level: Minimal Assistance -  Patient > 75% Assistive device: Walker-rolling    Walk 10 feet on uneven surface  activity   Assist Walk 10 feet on uneven surfaces activity did not occur: Safety/medical concerns   Assist level: Moderate Assistance - Patient - 50 - 74% Assistive device: Photographer Will patient use wheelchair at discharge?: No Type of Wheelchair: Manual Wheelchair activity did not occur: N/A         Wheelchair 50 feet with 2 turns activity    Assist    Wheelchair 50 feet with 2 turns activity did not occur: N/A       Wheelchair 150 feet activity     Assist  Wheelchair 150 feet activity did not occur: N/A       Blood pressure (!) 136/53, pulse 64, temperature 98.4 F (36.9 C), temperature source Oral, resp. rate 18, height 5\' 3"  (1.6 m), weight 57.6 kg, SpO2 95 %.  Medical Problem List and Plan: 1.  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  Impaired mobility, ambulating 150 feet, d/c Lovenox             -antiplatelet therapy: ASA and plavix 3. Mild right sided neuropathic pain: Continue Tylenol prn 4. Mood: LCSW to follow for evaluation and support             -antipsychotic agents: N/AA 5. Neuropsych: This patient is capable of making decisions on her own behalf. 6. Skin/Wound Care: Routine pressure-relief measures 7. Fluids/Electrolytes/Nutrition: Monitor intake/output.   --I personally reviewed the patient's labs today.   -encourage adequate liquid intake 8.  HTN: Monitor blood pressures 3 times daily.  Avoid hypotension. Vitals:   11/02/20 1934 11/03/20 0452  BP: (!) 135/49 (!) 136/53  Pulse: 67 64  Resp: 20 18  Temp: 97.9 F (36.6 C) 98.4 F (36.9 C)  SpO2: 97% 95%   In good range 7/11 9.  T2DM: Continue  metformin.  Monitor blood sugars ac/hs             --Use SSI for elevated blood sugar and titrate medication as indicated.  CBG (last 3)  Recent Labs    11/02/20 1629 11/02/20 2056 11/03/20 0601  GLUCAP 121* 141* 111*   Controlled 7/11 10. L- CAS: Continue DAPT--Plavix to stop 5 days prior to procedure.             -- Follow-up with Dr. 9/11 for surgery on outpatient basis. 11.  CKD: Baseline SCR 1.4-1.7 range, some acute insufficiency superimposed             -  BUN/Cr stable to improved from 7/5--continue to encourage fluids 12.  Dysuria- +WBC but rare bacteria, CX with 100k GNR 7/8- Kleb P  -pt with PCN/sulfa allergies. D/w pharmacy and will give dose of rocephin, sensitive Keflex x 5d LOS: 10 days A FACE TO FACE EVALUATION WAS PERFORMED  Erick Colace 11/03/2020, 7:34 AM

## 2020-11-03 NOTE — Progress Notes (Signed)
Patient ID: Cassandra Schaefer, female   DOB: 04-10-1929, 87 y.o.   MRN: [id number]  Patient referral faxed to [facility]  [place], [place] [phone]

## 2020-11-03 NOTE — Progress Notes (Signed)
Physical Therapy Session Note  Patient Details  Name: Cassandra Schaefer MRN: [id number] Date of Birth: 21-Nov-1928  Today's Date: 11/04/2020 PT Individual Time:  -      Short Term Goals: Week 1:  PT Short Term Goal 1 (Week 1): STG = LTG d/t  ELOS PT Short Term Goal 1 - Progress (Week 1): Progressing toward goal Week 2:  PT Short Term Goal 1 (Week 2): = to LTGs based on ELOS  Skilled Therapeutic Interventions/Progress Updates:  Patient supine in bed on entrance to room. Patient alert and agreeable to PT session. Shoes donned with Max A for time. Patient c/o R ankle pain at start of session that improves throughout.  Therapeutic Activity: Bed Mobility: Patient performed supine --> sit with supervision/ Mod I. No vc/ tc required for technique. Transfers: Patient performed STS and SPVT transfers throughout session with CGA/ supervision. Provided verbal cues throughout for reaching back to armrests prior to descent to sit, scooting forward prior to rise to stand, and push-to-stand technique from w/c to RW or stance with no AD. Improved ability to rise to stand from w/c without extensor push from BLE with vc that improves during session.   Gait Training:  Patient ambulated 150' x1/ 200' x1 using RW with supervision/ CGA. Demonstrated intermittent exaggerated step height with RLE and requires vc to maintain adequate step height. Good ability to maintain upright posture and level gaze throughout gait bouts.    Pt navigates up/ down four 6-in steps x2 with CGA with instruction provided prior to performance re: leading LE for ascent/ descent. VC throughout first performance with good techniauque throughout and vc for hand progression, sequencing of leading Le for step. In 2nd bout, pt questioned re: leading LE and provides incorrect info initially but is able to initiate performance correctly. Attempts 2nd step leading with RLE requiring vc to ascend with LLE first. Good technique for descent. In agreement  with therapists re: need for 2nd RW to remain on lower level where bathroom is located.   Neuromuscular Re-ed: NMR facilitated during session with focus on standing balance and increased RLE step height. Pt guided in reciprocal toe touches to 6" step into fatigue with vc throughout for clearance of R foot onto as well as off of step. Pushed into fatigue for improved performance carryover into quality of gait. NMR performed for improvements in motor control and coordination, balance, sequencing, judgement, and self confidence/ efficacy in performing all aspects of mobility at highest level of independence.   Patient seated upright  in recliner at end of session with brakes locked, seat alarm set, and all needs within reach. SLP taking over therapy session.      Therapy Documentation Precautions:  Precautions Precautions: Fall Precaution Comments: R hemipareisis Restrictions Weight Bearing Restrictions: No  Therapy/Group: Individual Therapy  Loel Dubonnet PT, DPT 11/03/2020, 1:00 PM

## 2020-11-03 NOTE — Progress Notes (Signed)
Physical Therapy Session Note  Patient Details  Name: Cassandra Schaefer MRN: [id number] Date of Birth: 12-11-1928  Today's Date: 11/03/2020 PT Individual Time: 1133-1200 PT Individual Time Calculation (min): 27 min   Short Term Goals: Week 1:  PT Short Term Goal 1 (Week 1): STG = LTG d/t  ELOS PT Short Term Goal 1 - Progress (Week 1): Progressing toward goal Week 2:  PT Short Term Goal 1 (Week 2): = to LTGs based on ELOS  Skilled Therapeutic Interventions/Progress Updates:  Pt received sitting in recliner, no reports of pain and pt is agreeable to PT.   Therapeutic Activity  -Sit <> stand from recliner to RW w/CGA. Verbal cues for hand placement.  -Stand pivot transfer to [facility] w/RW and CGA.  -Stair training to assess recall from earlier PT session with Almyra Free. Pt was able to teach therapist the correct leg to ascend and descend stairs with and required supervision to complete task. Completed 4 steps w/BUE support on rails. Minor LOB to R while turning at top of stair.   Gait Training  - Ambulated 65 ft x2 with no AD and min A. Pt held onto R handrail in hallway. Noted steppage gait pattern as well as slight trunk lean to R. Pt also demonstrated posterior truncal lean. Took short sitting rest break in between trials.   Pt was left in recliner in room with all needs met.   Therapy Documentation Precautions:  Precautions Precautions: Fall Precaution Comments: R hemipareisis Restrictions Weight Bearing Restrictions: No  Vital Signs: Therapy Vitals Temp: 98.4 F (36.9 C) Temp Source: Oral Pulse Rate: 64 Resp: 18 BP: (!) 136/53 Patient Position (if appropriate): Lying Oxygen Therapy SpO2: 95 % O2 Device: Room Air   Therapy/Group: Individual Therapy  Karrington Mccravy E Adama Ferber Cruzita Lederer Amra Shukla, PT, DPT  11/03/2020, 7:35 AM

## 2020-11-03 NOTE — Progress Notes (Signed)
Patient ID: Cassandra Schaefer, female   DOB: 1928/06/08, 87 y.o.   MRN: [id number]  Levan Hurst Ordered through [place], [place] [phone]

## 2020-11-03 NOTE — Progress Notes (Signed)
Occupational Therapy Session Note  Patient Details  Name: Cassandra Schaefer MRN: [id number] Date of Birth: 1928-10-14  Today's Date: 11/03/2020 OT Individual Time: 2094-7096 OT Individual Time Calculation (min): 44 min   Short Term Goals: Week 1:  OT Short Term Goal 1 (Week 1): STGs=LTGs due to short ELOS  Skilled Therapeutic Interventions/Progress Updates:    Pt greeted in the recliner, requesting to start session by brushing her teeth. Agreeable to stand at the sink to do so. She ambulated to the sink using her RW with CGA. Pt mindful of using her Rt hand as much as possible during grooming activity, able to turn on and manipulate electric toothbrush using the Rt. Also completed hair combing and washing glasses. She then returned to the recliner. We discussed her personal OT goals and pt wants to be able to use her Rt hand to signs checks again. Reports she wasn't able to hold a pencil when she tried writing before. Today she was able to write her name 10 times using a regular pen, legibility improving with repetition! To continue working on functional Rt hand skills, pt wrote a short letter to her daughter x2 as second attempt appeared more legible. Able to seal envelope with sticker. Pt agreeable to allow OT to mail this letter to her daughter to reflect her therapeutic progress. Pt reported that today she was able to eat using utensils with her Rt hand without using the built up tubing! Very proud of this. Provided her with a handout regarding therapeutic activities she can complete at home to further functional gains with her Rt hand. We discussed these activities together. Pt remained sitting up in the recliner, all needs within reach and chair alarm set.   Therapy Documentation Precautions:  Precautions Precautions: Fall Precaution Comments: R hemipareisis Restrictions Weight Bearing Restrictions: No  Vital Signs: Therapy Vitals Temp: 97.8 F (36.6 C) Pulse Rate: 74 Resp: 17 BP: (!)  136/48 Patient Position (if appropriate): Sitting Oxygen Therapy SpO2: 100 % O2 Device: Room Air Pain: pt denied pain during session   ADL:        Therapy/Group: Individual Therapy  Trenika Hudson A Gwynn Crossley 11/03/2020, 3:43 PM

## 2020-11-03 NOTE — Progress Notes (Signed)
Speech Language Pathology Daily Session Note  Patient Details  Name: Cassandra Schaefer MRN: [id number] Date of Birth: September 28, 1928  Today's Date: 11/03/2020 SLP Individual Time: 6301-6010 SLP Individual Time Calculation (min): 30 min  Short Term Goals: Week 2: SLP Short Term Goal 1 (Week 2): ST=LTGs due to ELOS  Skilled Therapeutic Interventions: Patient agreeable to participate in skilled ST intervention with focus on speech and language goals. SLP facilitated verbal thought organization and sequencing task with sup A verbal cues for use speech intelligibility strategies including pacing techniques and over articulation with multisyllabic words at conversational level. Patient was observed talking briefly on the phone with a friend at the end of session in which she exhibited increased errors with speech production attributed to increased speech rate. Patient expressed "I have a lot to say on the phone" which at times increases speech errors. Patient continues to exhibit improved self monitoring skills and ability to repair speech breakdowns independently. Patient did not exhibit any word finding difficulty during structured tasks or conversation, although occasional hesitations were observed. During previous session patient reported difficulty recalling the sequential order of her prayers which impacts her verbal production. SLP facilitated written visual containing sequential order of patient's daily prayer. Patient demonstrated use with good flow and minimal hesitations during 2/2 trials. Patient was left in wheelchair with alarm activated and needs within reach at the end of session. Continue per ST plan of care.     Pain Pain Assessment Pain Scale: 0-10 Pain Score: 0-No pain  Therapy/Group: Individual Therapy  Cassandra Schaefer 11/03/2020, 11:12 AM

## 2020-11-04 LAB — GLUCOSE, CAPILLARY
Glucose-Capillary: 126 mg/dL — ABNORMAL HIGH (ref 70–99)
Glucose-Capillary: 106 mg/dL — ABNORMAL HIGH (ref 70–99)
Glucose-Capillary: 131 mg/dL — ABNORMAL HIGH (ref 70–99)
Glucose-Capillary: 107 mg/dL — ABNORMAL HIGH (ref 70–99)

## 2020-11-04 NOTE — Discharge Instructions (Addendum)
Inpatient Rehab Discharge Instructions  Cassandra Schaefer [facility] Discharge date and time: 11/07/20   Activities/Precautions/ Functional Status: Activity: no lifting, driving, or strenuous exercise till cleared by MD Diet: cardiac diet and diabetic diet Wound Care: none needed   Functional status:  ___ No restrictions     ___ Walk up steps independently _X__ 24/7 supervision/assistance   ___ Walk up steps with assistance ___ Intermittent supervision/assistance  ___ Bathe/dress independently ___ Walk with walker     ___ Bathe/dress with assistance ___ Walk Independently    ___ Shower independently ___ Walk with assistance    __X_ Shower with assistance _X__ No alcohol     ___ Return to work/school ________    COMMUNITY REFERRALS UPON DISCHARGE:    Outpatient: PT     OT    ST                Agency:[facility]  Phone: [phone]              Appointment Date/Time:TBD by Facility   Medical Equipment/Items Ordered: Levan Hurst, Bedside Commode                                                  Agency/Supplier:[employer]   Special Instructions: Monitor blood sugars 3-4 times a day till off prednisone. Drink plenty of water to flush your kidneys as blood sugars may run higher for a period of time.    STROKE/TIA DISCHARGE INSTRUCTIONS SMOKING Cigarette smoking nearly doubles your risk of having a stroke & is the single most alterable risk factor  If you smoke or have smoked in the last 12 months, you are advised to quit smoking for your health. Most of the excess cardiovascular risk related to smoking disappears within a year of stopping. Ask you doctor about anti-smoking medications [place] Quit Line: 1-800-QUIT NOW Free Smoking Cessation Classes [phone]  CHOLESTEROL Know your levels; limit fat & cholesterol in your diet  Lipid Panel     Component Value Date/Time   CHOL 128 10/19/2020 2101   TRIG 211 (H) 10/19/2020 2101   HDL 43 10/19/2020  2101   CHOLHDL 3.0 10/19/2020 2101   VLDL 42 (H) 10/19/2020 2101   LDLCALC 43 10/19/2020 2101     Many patients benefit from treatment even if their cholesterol is at goal. Goal: Total Cholesterol (CHOL) less than 160 Goal:  Triglycerides (TRIG) less than 150 Goal:  HDL greater than 40 Goal:  LDL (LDLCALC) less than 100   BLOOD PRESSURE American Stroke Association blood pressure target is less that 120/80 mm/Hg  Your discharge blood pressure is:  BP: (!) 134/49 Monitor your blood pressure Limit your salt and alcohol intake Many individuals will require more than one medication for high blood pressure  DIABETES (A1c is a blood sugar average for last 3 months) Goal HGBA1c is under 7% (HBGA1c is blood sugar average for last 3 months)  Diabetes:     Lab Results  Component Value Date   HGBA1C 7.0 (H) 10/19/2020    Your HGBA1c can be lowered with medications, healthy diet, and exercise. Check your blood sugar as directed by your physician Call your physician if you experience unexplained or low blood sugars.  PHYSICAL ACTIVITY/REHABILITATION Goal is 30 minutes at least 4 days per week  Activity: Increase activity slowly, and No driving,  Therapies: see above Return to work: N/A Activity decreases your risk of heart attack and stroke and makes your heart stronger.  It helps control your weight and blood pressure; helps you relax and can improve your mood. Participate in a regular exercise program. Talk with your doctor about the best form of exercise for you (dancing, walking, swimming, cycling).  DIET/WEIGHT Goal is to maintain a healthy weight  Your discharge diet is:  Diet Order             Diet Carb Modified Fluid consistency: Thin; Room service appropriate? Yes  Diet effective now                   liquids Your height is:  Height: 5\' 3"  (160 cm) Your current weight is: 140 lbs Your Body Mass Index (BMI) is:  BMI (Calculated): 22.5 Following the type of diet specifically  designed for you will help prevent another stroke. You are at goal weight range is:   Your goal Body Mass Index (BMI) is 19-24. Healthy food habits can help reduce 3 risk factors for stroke:  High cholesterol, hypertension, and excess weight.  RESOURCES Stroke/Support Group:  Call [phone]   STROKE EDUCATION PROVIDED/REVIEWED AND GIVEN TO PATIENT Stroke warning signs and symptoms How to activate emergency medical system (call 911). Medications prescribed at discharge. Need for follow-up after discharge. Personal risk factors for stroke. Pneumonia vaccine given:  Flu vaccine given:  My questions have been answered, the writing is legible, and I understand these instructions.  I will adhere to these goals & educational materials that have been provided to me after my discharge from the hospital.      My questions have been answered and I understand these instructions. I will adhere to these goals and the provided educational materials after my discharge from the hospital.  Patient/Caregiver Signature _______________________________ Date __________  Clinician Signature _______________________________________ Date __________  Please bring this form and your medication list with you to all your follow-up doctor's appointments.

## 2020-11-04 NOTE — Progress Notes (Signed)
Physical Therapy Session Note  Patient Details  Name: Cassandra Schaefer MRN: [id number] Date of Birth: 1929-04-17  Today's Date: 11/04/2020 PT Individual Time: 0093-8182 PT Individual Time Calculation (min): 56 min   Short Term Goals: Week 2:  PT Short Term Goal 1 (Week 2): = to LTGs based on ELOS  Skilled Therapeutic Interventions/Progress Updates:    Pt received supine in bed and eager to participate in therapy session. Supine>sitting R EOB, HOB slightly elevated, with supervision. Used pt's personal RW during session for real lift practice - therapist ensured it was set at proper height. Sit>stand EOB>RW with pt inquiring about proper hand positioning during transfer - continues to push against bed with backs of legs for balance - tried various hand positionings and decided R hand on RW and pushing up from mat or L LE with L hand was most sturdy and safe - repeated education on importance of scooting hips forward prior to initiating transition to stand. Gait training ~173ft to main therapy gym using RW with CGA for steadying - with longer distance demos decreasing  RLE foot clearance but pt able to correct with minimal cuing - continues to demo a slower gait speed though improving.   Repeated sit<>stands to/from EOM targeting motor recall of proper hand placement during transfers (R on RW and L pushing up on L LE) x8 reps with close supervision/CGA for safety.  Stair navigation training 2x4 steps with pt having to perform recall of proper navigation technique - step to leading with L on ascent and R on descent, requires max cuing to recall this - requires CGA for steadying throughout. - cuing to bring R hand with her on rail (often pt leaving it behind) - guarding for posterior lean once at bottom of steps for safety - reinforced need for 2 RW (one for at top and bottom of steps) or need for family to assist with AD management   Gait training ~65ft 2x, no AD, with light min assist for balance  but requires a few standing rest breaks to "reset" and recover balance due to poor R LE foot clearance causing anterior lean - facilitation/cuing for pelvic weight shift over onto stance limb, which improves balance/stability and step lengths.  R LE NMR via ascending/descending stairs reciprocally, B UE support for safety, and CGA for steadying - pt demos good R LE foot clearance up onto step, facilitation for increased glute/quad activation while stepping up.  Dynamic standing balance task with R UE NMR training APRs and APAs via tossing large ball into rebounder - initially pt with poor R UE motor coordination with inability to catch ball, but improved and able to perform 5 reps without loosing the ball - demos excessively quick/large amplitude movements that with increased reps pt is able to moderate and increase coordination - requires CGA for safety.  Gait training ~113ft using RW back to room with more consistent min assist at this time due to fatigue and increasing R ankle discomfort leading to worse R LE foot clearance and more shuffled gait pattern with anterior lean/LOB. Pt left sitting in recliner with B LEs elevated - needs in reach and chair alarm on.    Therapy Documentation Precautions:  Precautions Precautions: Fall Precaution Comments: R hemipareisis Restrictions Weight Bearing Restrictions: No   Pain:  Reports R ankle "soreness" pain during session - has noticeable swelling and redness around medial malleolus - pt states "if it gets to be too much I'll tell you." Noticed some increase discomfort by  end of session - provided seated rest breaks and elevated feet at end.   Therapy/Group: Individual Therapy  Ginny Forth , PT, DPT, NCS, CSRS 11/04/2020, 3:29 PM

## 2020-11-04 NOTE — Progress Notes (Signed)
Speech Language Pathology Daily Session Note  Patient Details  Name: Cassandra Schaefer MRN: [id number] Date of Birth: 1928/12/30  Today's Date: 11/04/2020 SLP Individual Time: 1115-1200 SLP Individual Time Calculation (min): 45 min  Short Term Goals: Week 2: SLP Short Term Goal 1 (Week 2): ST=LTGs due to ELOS  Skilled Therapeutic Interventions: Patient agreeable to skilled ST intervention with focus on speech and language goals. Facilitated verbal reading with patient preferred daily prayer book with supervision A verbal cues to implement speech intelligibility strategies including over-articulation and reducing speech rate. Patient benefited from cues to more intentionally articulate each syllable of 3+ syllable words. Patient was perceived as 80-90% intelligible during verbal reading task, and 100% intelligible at conversational level. Facilitated generative naming task and semantic feature analysis with sup A verbal cues for effectiveness. Patient did not exhibit any word finding difficulty at conversational level and is able to effectively communicate her functional needs. Patient was left in recliner chair at end of session with alarm activated and needs within reach. Continue per ST plan of care.    Pain Pain Assessment Pain Scale: 0-10 Pain Score: 0-No pain  Therapy/Group: Individual Therapy  [name] 11/04/2020, 11:15 AM

## 2020-11-04 NOTE — Progress Notes (Signed)
Occupational Therapy Session Note  Patient Details  Name: Lisaanne Lawrie MRN: [id number] Date of Birth: January 13, 1929  Today's Date: 11/04/2020 OT Individual Time: 1000-1100 OT Individual Time Calculation (min): 60 min    Short Term Goals: Week 2:  OT Short Term Goal 1 (Week 2): Continue working on established goals at supervision level.  Skilled Therapeutic Interventions/Progress Updates:    Patient seated in recliner, states that she completed adl tasks earlier this morning and would like to work on her right hand strength and coordination.  She notes mild pain in ankle that she believes is arthritis.  Sit to stand and SPT with RW to/from recliner, w/c, mat table with CGA, min cues for hand placement.  Completed a variety of hand grasp/pinch and coordination tasks in unsupported sitting with good tolerance - included trunk and seated balance with CS.  Standing domino activity with CS - tolerated standing for >15 minutes.  Short distance ambulation with RW to/from toilet with CGA.  Toileting completed with CGA.  She returned to recliner at close of session, seat belt alarm set and call bell in hand.    Therapy Documentation Precautions:  Precautions Precautions: Fall Precaution Comments: R hemipareisis Restrictions Weight Bearing Restrictions: No   Therapy/Group: Individual Therapy  Barrie Lyme 11/04/2020, 7:33 AM

## 2020-11-04 NOTE — Progress Notes (Signed)
PROGRESS NOTE   Subjective/Complaints:  No issues overnite , discussed BP and CBG  ROS: Patient denies CP SOB N/V/D Objective:   No results found. No results for input(s): WBC, HGB, HCT, PLT in the last 72 hours. No results for input(s): NA, K, CL, CO2, GLUCOSE, BUN, CREATININE, CALCIUM in the last 72 hours.   Intake/Output Summary (Last 24 hours) at 11/04/2020 0734 Last data filed at 11/03/2020 1837 Gross per 24 hour  Intake 593 ml  Output --  Net 593 ml         Physical Exam: Vital Signs Blood pressure (!) 118/51, pulse 60, temperature 98.2 F (36.8 C), temperature source Oral, resp. rate 18, height 5\' 3"  (1.6 m), weight 57.6 kg, SpO2 95 %.   General: No acute distress Mood and affect are appropriate Heart: Regular rate and rhythm no rubs murmurs or extra sounds Lungs: Clear to auscultation, breathing unlabored, no rales or wheezes Abdomen: Positive bowel sounds, soft nontender to palpation, nondistended Extremities: No clubbing, cyanosis, or edema Skin: No evidence of breakdown, no evidence of rash   Neurologic: Cranial nerves II through XII intact, motor strength is 5/5 in bilateral deltoid, bicep, tricep, grip, hip flexor, knee extensors, ankle dorsiflexor and plantar flexor Sensory exam normal sensation to light touch and proprioception in bilateral upper and lower extremities Cerebellar exam normal finger to nose to finger as well as heel to shin in bilateral upper and lower extremities Musculoskeletal: Full range of motion in all 4 extremities. No joint swelling   Neuro: Pt is alert and oriented. Phonation,speech improving.  Right central 7. RUE 4/5 all muscle groups  RLE 4-/5.   Sensation equal,  Cerebellar- dysmetria RUE and RLE Musculoskeletal: Full ROM, No pain with AROM or PROM in the neck, trunk, or extremities. Posture appropriate    Assessment/Plan: 1. Functional deficits which require 3+ hours  per day of interdisciplinary therapy in a comprehensive inpatient rehab setting. Physiatrist is providing close team supervision and 24 hour management of active medical problems listed below. Physiatrist and rehab team continue to assess barriers to discharge/monitor patient progress toward functional and medical goals  Care Tool:  Bathing  Bathing activity did not occur: Refused (per OT eval) Body parts bathed by patient: Face, Left arm, Right arm, Chest, Abdomen   Body parts bathed by helper: Buttocks Body parts n/a: Front perineal area, Buttocks, Right upper leg, Left upper leg, Left lower leg, Right lower leg (did not attempt this session)   Bathing assist Assist Level: Contact Guard/Touching assist (standing at the sink)     Upper Body Dressing/Undressing Upper body dressing   What is the patient wearing?: Pull over shirt    Upper body assist Assist Level: Supervision/Verbal cueing    Lower Body Dressing/Undressing Lower body dressing      What is the patient wearing?: Pants     Lower body assist Assist for lower body dressing: Minimal Assistance - Patient > 75%     Toileting Toileting    Toileting assist Assist for toileting: Minimal Assistance - Patient > 75%     Transfers Chair/bed transfer  Transfers assist     Chair/bed transfer assist level: Minimal Assistance -  Patient > 75% Chair/bed transfer assistive device: Arboriculturist assist      Assist level: Minimal Assistance - Patient > 75% Assistive device: Walker-rolling Max distance: 18ft   Walk 10 feet activity   Assist     Assist level: Minimal Assistance - Patient > 75% Assistive device: Walker-rolling   Walk 50 feet activity   Assist    Assist level: Minimal Assistance - Patient > 75% Assistive device: Walker-rolling    Walk 150 feet activity   Assist Walk 150 feet activity did not occur: Safety/medical concerns  Assist level: Minimal  Assistance - Patient > 75% Assistive device: Walker-rolling    Walk 10 feet on uneven surface  activity   Assist Walk 10 feet on uneven surfaces activity did not occur: Safety/medical concerns   Assist level: Moderate Assistance - Patient - 50 - 74% Assistive device: Photographer Will patient use wheelchair at discharge?: No Type of Wheelchair: Manual Wheelchair activity did not occur: N/A         Wheelchair 50 feet with 2 turns activity    Assist    Wheelchair 50 feet with 2 turns activity did not occur: N/A       Wheelchair 150 feet activity     Assist  Wheelchair 150 feet activity did not occur: N/A       Blood pressure (!) 118/51, pulse 60, temperature 98.2 F (36.8 C), temperature source Oral, resp. rate 18, height 5\' 3"  (1.6 m), weight 57.6 kg, SpO2 95 %.  Medical Problem List and Plan: 1.  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  Impaired mobility, ambulating 150 feet, d/c Lovenox             -antiplatelet therapy: ASA and plavix 3. Mild right sided neuropathic pain: Continue Tylenol prn 4. Mood: LCSW to follow for evaluation and support             -antipsychotic agents: N/AA 5. Neuropsych: This patient is capable of making decisions on her own behalf. 6. Skin/Wound Care: Routine pressure-relief measures 7. Fluids/Electrolytes/Nutrition: Monitor intake/output.   --I personally reviewed the patient's labs today.   -encourage adequate liquid intake 8.  HTN: Monitor blood pressures 3 times daily.  Avoid hypotension. Vitals:   11/03/20 1930 11/04/20 0503  BP: (!) 148/53 (!) 118/51  Pulse: 67 60  Resp: 17 18  Temp: 98.2 F (36.8 C) 98.2 F (36.8 C)  SpO2: 95% 95%   In good range 7/12 9.  T2DM:  Continue metformin.  Monitor blood sugars ac/hs             --Use SSI for elevated blood sugar and titrate medication as indicated.  CBG (last 3)  Recent Labs    11/03/20 1622 11/03/20 2049 11/04/20 0604  GLUCAP 108* 119* 126*   Controlled 7/12 10. L- CAS: Continue DAPT--Plavix to stop 5 days prior to procedure.             -- Follow-up with Dr. 9/12 for surgery on outpatient basis. 11.  CKD: Baseline SCR 1.4-1.7 range, some acute insufficiency superimposed             -  BUN/Cr stable to improved from 7/5--continue to encourage fluids 12.  Dysuria- +WBC but rare bacteria, CX with 100k GNR 7/8- Kleb P  -pt with PCN/sulfa allergies. D/w pharmacy and will give dose of rocephin, sensitive Keflex x 5d LOS: 11 days A FACE TO FACE EVALUATION WAS PERFORMED  Erick Colace 11/04/2020, 7:34 AM

## 2020-11-04 NOTE — Progress Notes (Signed)
Occupational Therapy Session Note  Patient Details  Name: Cassandra Schaefer MRN: [id number] Date of Birth: 02-27-1929  Today's Date: 11/04/2020 OT Individual Time: 4665-9935 OT Individual Time Calculation (min): 45 min    Short Term Goals: Week 1:  OT Short Term Goal 1 (Week 1): STGs=LTGs due to short ELOS  Skilled Therapeutic Interventions/Progress Updates:  Pt received supine in bed agreeable to OT intervention. Pt currently requires CGA for bed mobility, and transfers. Pt completed full shower with CGA for bathing in standing to wash buttock. Pt completed seated/ standing grooming tasks at sink with CGA for standing tasks and MOD I for seated grooming tasks from w/c. Pt currently is able to don OH shirt MOD I and required CGA for LB dressing needing steadying assist for sit<>stand to pull pants up to waist line. Pt completed standing dynamic balance tasks with pt able to maintain balance with MIN A with no UE support.Additionally worked on [facility] via writing on [employer] in standing with pt having difficulty with accuracy when instructing  pt to circle all vowels on white board, impaired coordination and accuracy noted when writing pts name in standing. Pt left seated in recliner with alarm belt activated and all needs within reach.   Therapy Documentation Precautions:  Precautions Precautions: Fall Precaution Comments: R hemipareisis Restrictions Weight Bearing Restrictions: No General:   Vital Signs:  Pain: Pt reports mild pain in R ankle, utilized rest breaks and repositioning as pain mgmt stratefy with pt able to tolerate session.    Therapy/Group:   Barron Schmid 11/04/2020, 11:27 AM

## 2020-11-04 NOTE — Plan of Care (Signed)
  Problem: RH Balance Goal: LTG Patient will maintain dynamic standing balance (PT) Description: LTG:  Patient will maintain dynamic standing balance with assistance during mobility activities (PT) Flowsheets (Taken 11/04/2020 1830) LTG: Pt will maintain dynamic standing balance during mobility activities with:: (downgraded based on pt progress) Contact Guard/Touching assist Note: downgraded based on pt progress   Problem: Sit to Stand Goal: LTG:  Patient will perform sit to stand with assistance level (PT) Description: LTG:  Patient will perform sit to stand with assistance level (PT) Flowsheets (Taken 11/04/2020 1830) LTG: PT will perform sit to stand in preparation for functional mobility with assistance level: (downgraded based on pt progress) Supervision/Verbal cueing Note: downgraded based on pt progress   Problem: RH Ambulation Goal: LTG Patient will ambulate in controlled environment (PT) Description: LTG: Patient will ambulate in a controlled environment, # of feet with assistance (PT). Flowsheets (Taken 11/04/2020 1830) LTG: Pt will ambulate in controlled environ  assist needed:: (downgraded based on pt progress) Supervision/Verbal cueing LTG: Ambulation distance in controlled environment: 154ft using LRAD Note: downgraded based on pt progress

## 2020-11-05 LAB — GLUCOSE, CAPILLARY
Glucose-Capillary: 134 mg/dL — ABNORMAL HIGH (ref 70–99)
Glucose-Capillary: 120 mg/dL — ABNORMAL HIGH (ref 70–99)
Glucose-Capillary: 142 mg/dL — ABNORMAL HIGH (ref 70–99)
Glucose-Capillary: 134 mg/dL — ABNORMAL HIGH (ref 70–99)

## 2020-11-05 LAB — URIC ACID: Uric Acid, Serum: 9 mg/dL — ABNORMAL HIGH (ref 2.5–7.1)

## 2020-11-05 LAB — CBC
HCT: 32.9 % — ABNORMAL LOW (ref 36.0–46.0)
Hemoglobin: 10.9 g/dL — ABNORMAL LOW (ref 12.0–15.0)
MCH: 31.1 pg (ref 26.0–34.0)
MCHC: 33.1 g/dL (ref 30.0–36.0)
MCV: 93.7 fL (ref 80.0–100.0)
Platelets: 236 10*3/uL (ref 150–400)
RBC: 3.51 MIL/uL — ABNORMAL LOW (ref 3.87–5.11)
RDW: 11.8 % (ref 11.5–15.5)
WBC: 11.4 10*3/uL — ABNORMAL HIGH (ref 4.0–10.5)
nRBC: 0 % (ref 0.0–0.2)

## 2020-11-05 LAB — BASIC METABOLIC PANEL
Anion gap: 8 (ref 5–15)
BUN: 23 mg/dL (ref 8–23)
CO2: 23 mmol/L (ref 22–32)
Calcium: 8.9 mg/dL (ref 8.9–10.3)
Chloride: 106 mmol/L (ref 98–111)
Creatinine, Ser: 1.55 mg/dL — ABNORMAL HIGH (ref 0.44–1.00)
GFR, Estimated: 32 mL/min — ABNORMAL LOW (ref >60)
Glucose, Bld: 116 mg/dL — ABNORMAL HIGH (ref 70–99)
Potassium: 3.4 mmol/L — ABNORMAL LOW (ref 3.5–5.1)
Sodium: 137 mmol/L (ref 135–145)

## 2020-11-05 MED ORDER — TRIAMCINOLONE ACETONIDE 40 MG/ML IJ SUSP
40.0000 mg | Freq: Once | INTRAMUSCULAR | Status: AC
  Administered 2020-11-05: 40 mg via INTRAMUSCULAR
  Filled 2020-11-05: qty 1

## 2020-11-05 MED ORDER — POTASSIUM CHLORIDE CRYS ER 20 MEQ PO TBCR
30.0000 meq | EXTENDED_RELEASE_TABLET | Freq: Two times a day (BID) | ORAL | Status: AC
  Administered 2020-11-05 (×2): 30 meq via ORAL
  Filled 2020-11-05 (×2): qty 1

## 2020-11-05 MED ORDER — POTASSIUM CHLORIDE CRYS ER 10 MEQ PO TBCR: 10.0000 meq | EXTENDED_RELEASE_TABLET | Freq: Every day | ORAL | Status: DC

## 2020-11-05 MED ORDER — POTASSIUM CHLORIDE CRYS ER 20 MEQ PO TBCR
40.0000 meq | EXTENDED_RELEASE_TABLET | Freq: Every day | ORAL | Status: AC
  Administered 2020-11-05: 40 meq via ORAL
  Filled 2020-11-05: qty 2

## 2020-11-05 NOTE — Progress Notes (Addendum)
PROGRESS NOTE   Subjective/Complaints:  Right ankle pain, hx gout, pain was relieved by tylenol last noc  Reviewed labs below   ROS: Patient denies CP SOB N/V/D Objective:   No results found. Recent Labs    11/05/20 0519  WBC 11.4*  HGB 10.9*  HCT 32.9*  PLT 236   Recent Labs    11/05/20 0519  NA 137  K 3.4*  CL 106  CO2 23  GLUCOSE 116*  BUN 23  CREATININE 1.55*  CALCIUM 8.9     Intake/Output Summary (Last 24 hours) at 11/05/2020 0757 Last data filed at 11/04/2020 1834 Gross per 24 hour  Intake 400 ml  Output --  Net 400 ml         Physical Exam: Vital Signs Blood pressure (!) 125/44, pulse (!) 54, temperature 97.8 F (36.6 C), temperature source Oral, resp. rate 18, height 5\' 3"  (1.6 m), weight 57.6 kg, SpO2 99 %.  General: No acute distress Mood and affect are appropriate Heart: Regular rate and rhythm no rubs murmurs or extra sounds Lungs: Clear to auscultation, breathing unlabored, no rales or wheezes Abdomen: Positive bowel sounds, soft nontender to palpation, nondistended Extremities: No clubbing, cyanosis, or edema Skin: No evidence of breakdown, no evidence of rash     Neuro: Pt is alert and oriented. Phonation,speech improving.  Right central 7. RUE 4/5 all muscle groups  RLE 4-/5.   Sensation equal,  Cerebellar- dysmetria RUE and RLE Musculoskeletal:RIght medial malleolar area erythematous, moderately tender to light pressure Mild pain with PROM    Assessment/Plan: 1. Functional deficits which require 3+ hours per day of interdisciplinary therapy in a comprehensive inpatient rehab setting. Physiatrist is providing close team supervision and 24 hour management of active medical problems listed below. Physiatrist and rehab team continue to assess barriers to discharge/monitor patient progress toward functional and medical goals  Care Tool:  Bathing  Bathing activity did not occur:  Refused (per OT eval) Body parts bathed by patient: Face, Left arm, Right arm, Chest, Abdomen, Front perineal area, Buttocks, Right upper leg, Left upper leg, Right lower leg, Left lower leg   Body parts bathed by helper: Buttocks Body parts n/a: Front perineal area, Buttocks, Right upper leg, Left upper leg, Left lower leg, Right lower leg (did not attempt this session)   Bathing assist Assist Level: Contact Guard/Touching assist (standing to wash buttock)     Upper Body Dressing/Undressing Upper body dressing   What is the patient wearing?: Pull over shirt    Upper body assist Assist Level: Independent with assistive device Assistive Device Comment: from w/c level  Lower Body Dressing/Undressing Lower body dressing      What is the patient wearing?: Pants, Underwear/pull up     Lower body assist Assist for lower body dressing: Contact Guard/Touching assist     Toileting Toileting    Toileting assist Assist for toileting: Contact Guard/Touching assist     Transfers Chair/bed transfer  Transfers assist     Chair/bed transfer assist level: Contact Guard/Touching assist Chair/bed transfer assistive device: Walker   Locomotion Ambulation   Ambulation assist      Assist level: Minimal Assistance - Patient >  75% Assistive device: Walker-rolling Max distance: 187ft   Walk 10 feet activity   Assist     Assist level: Contact Guard/Touching assist Assistive device: Walker-rolling   Walk 50 feet activity   Assist    Assist level: Contact Guard/Touching assist Assistive device: Walker-rolling    Walk 150 feet activity   Assist Walk 150 feet activity did not occur: Safety/medical concerns  Assist level: Minimal Assistance - Patient > 75% Assistive device: Walker-rolling    Walk 10 feet on uneven surface  activity   Assist Walk 10 feet on uneven surfaces activity did not occur: Safety/medical concerns   Assist level: Moderate Assistance -  Patient - 50 - 74% Assistive device: Photographer Will patient use wheelchair at discharge?: No Type of Wheelchair: Manual Wheelchair activity did not occur: N/A         Wheelchair 50 feet with 2 turns activity    Assist    Wheelchair 50 feet with 2 turns activity did not occur: N/A       Wheelchair 150 feet activity     Assist  Wheelchair 150 feet activity did not occur: N/A       Blood pressure (!) 125/44, pulse (!) 54, temperature 97.8 F (36.6 C), temperature source Oral, resp. rate 18, height 5\' 3"  (1.6 m), weight 57.6 kg, SpO2 99 %.  Medical Problem List and Plan: 1.  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  Impaired mobility, ambulating 150 feet, d/c Lovenox             -antiplatelet therapy: ASA and plavix 3. Mild right sided neuropathic pain: Continue Tylenol prn 4. Mood: LCSW to follow for evaluation and support             -antipsychotic agents: N/AA 5. Neuropsych: This patient is capable of making decisions on her own behalf. 6. Skin/Wound Care: Routine pressure-relief measures 7. Fluids/Electrolytes/Nutrition: Monitor intake/output.   --I personally reviewed the patient's labs today.   -encourage adequate liquid intake 8.  HTN: Monitor blood pressures 3 times daily.  Avoid hypotension. Vitals:   11/04/20 1944 11/05/20 0531  BP: (!) 127/103 (!) 125/44  Pulse: 73 (!) 54  Resp: 20 18  Temp: 97.7 F (36.5 C) 97.8 F (36.6 C)  SpO2: 100% 99%   In good range 7/13 9.  T2DM: Continue metformin.  Monitor blood sugars ac/hs             --Use SSI for elevated blood sugar and titrate medication as indicated.  CBG (last 3)  Recent Labs    11/04/20 1148 11/04/20 1634 11/04/20 2109  GLUCAP 106* 131*  107*   Controlled 7/13 Metformin XR 500mg  daily 10. L- CAS: Continue DAPT--Plavix to stop 5 days prior to procedure.             -- Follow-up with Dr. 8/13 for surgery on outpatient basis. 11.  CKD: Baseline SCR 1.4-1.7 range, some acute insufficiency superimposed             -BUN/Cr stable to improved from 7/5--continue to encourage fluids 12.  Dysuria- +WBC but rare bacteria, CX  with 100k GNR 7/8- Kleb P  -pt with PCN/sulfa allergies. D/w pharmacy and will give dose of rocephin, sensitive Keflex x 5d 13.  RIght medial ankle pain, pt states she has hx gout, will check urate level , WBC at baseline, doubt infx, give IM triamcinolone x 1  LOS: 12 days A FACE TO FACE EVALUATION WAS PERFORMED  Erick Colace 11/05/2020, 7:57 AM

## 2020-11-05 NOTE — Progress Notes (Signed)
Patient ID: Cassandra Schaefer, female   DOB: [birth year], 87 y.o.   MRN: [id number] Team Conference Report to Patient/Family  Team Conference discussion was reviewed with the patient and caregiver, including goals, any changes in plan of care and target discharge date.  Patient and caregiver express understanding and are in agreement.  The patient has a target discharge date of 11/07/20.  SW called patient daughter to provides updates. Patient on target for discharge Friday. DME ordered through [employer]. OP referral sent to [facility]. Daughter will be present for education tomorrow morning 8-11AM  Andria Rhein 11/05/2020, 11:24 AM

## 2020-11-05 NOTE — Progress Notes (Signed)
Speech Language Pathology Daily Session Note  Patient Details  Name: Cassandra Schaefer MRN: [id number] Date of Birth: 1928-10-16  Today's Date: 11/05/2020 SLP Individual Time: 1447-1530 SLP Individual Time Calculation (min): 43 min  Short Term Goals: Week 2: SLP Short Term Goal 1 (Week 2): ST=LTGs due to ELOS  Skilled Therapeutic Interventions: Pt seen for skilled ST with focus on speech and language goals. SLP facilitated verbal reading with patient preferred daily prayer book with min fading to supervision A verbal cues to implement speech intelligibility strategies, primarily slow rate and over-articulation. Pt reading recording to increase awareness and auditory feedback of speech intelligibility with and without use of strategies. Discussed the likely need to utilize strategies more heavily when talking on the phone vs in person, pt reports she does think people have a harder time understanding on the phone. No significant word finding deficits throughout session. Discussed plan for family education tomorrow with daughter before upcoming d/c. Pt left in recliner with alarm belt set and all needs within reach. Cont ST POC.  Pain Pain Assessment Pain Scale: 0-10 Pain Score: 0-No pain  Therapy/Group: Individual Therapy  Tacey Ruiz 11/05/2020, 3:30 PM

## 2020-11-05 NOTE — Progress Notes (Signed)
Patient ID: Cassandra Schaefer, female   DOB: 12/11/1928, 87 y.o.   MRN: [id number]  SW received call from daughter requesting family education on tomorrow. SW reviewed scheduled. Scheduling already determined for 7/14. SW informed daughter to attend sessions that are scheduled on tomorrow, scheduled will be provided this evening.    [place], [place] [phone]

## 2020-11-05 NOTE — Progress Notes (Signed)
Physical Therapy Session Note  Patient Details  Name: Cassandra Schaefer MRN: [id number] Date of Birth: 20-May-1928  Today's Date: 11/05/2020 PT Individual Time: 4132-4401 PT Individual Time Calculation (min): 60 min   Short Term Goals: Week 2:  PT Short Term Goal 1 (Week 2): = to LTGs based on ELOS  Skilled Therapeutic Interventions/Progress Updates:    Pt received sitting in w/c and agreeable to therapy session. Sit>stand w/c>RW with min cuing for proper hand placement for safety with AD and close supervision for balance (poor memory recall of education on proper hand placement with transfers from yesterday). Gait training ~139ft to main therapy gym using RW with CGA progressing to close supervision for safety - pt demos very slow, intentional gait mechanics with improved  RLE foot clearance and reciprocal stepping pattern. Participated in 1 set of the following exercises in preparation to perform at home for an HEP. Therapist educating pt on need for her daughter to guard her during all standing exercises for safety, pt in agreement and aware of this need. Written on HEP, included some additional head turn and eye closed tasks that can be performed during narrow based stance. Therapist providing pt cuing and education on how to perform each exercise.   Access Code: [email or web address] URL: [email or web address] Date: 11/05/2020 Prepared by: Casimiro Needle  Exercises Single Leg Bridge - 1 x daily - 7 x weekly - 2 sets - 15 reps Sit to Stand with Counter Support - 1 x daily - 7 x weekly - 2 sets - 10 reps Side Stepping with Counter Support - 1 x daily - 7 x weekly - 3 sets - 10 reps Narrow Stance with Counter Support - 1 x daily - 7 x weekly - 2 sets - 30 seconds hold Narrow Stance with Head Nods and Counter Support - 1 x daily - 7 x weekly - 2 sets - 10 reps  Patient participated in [employer] and demonstrates increased fall risk as noted by score of  28/56; however, this is a  significant improvement from the 15/56 demonstrated at eval.  (<36= high risk for falls, close to 100%; 37-45 significant >80%; 46-51 moderate >50%; 52-55 lower >25%). Educated pt on results of test and need for 24hr supervision/assist and use of RW. Transported back to room in w/c due to increasing R ankle pain with WBing. Stand pivot w/c>recliner, no AD, with CGA for steadying. Pt left sitting in recliner with needs in reach, seat belt alarm on, and NT present to assume care of patient.  Therapy Documentation Precautions:  Precautions Precautions: Fall Precaution Comments: R hemipareisis Restrictions Weight Bearing Restrictions: No   Pain: Reports 0/10 pain in R ankle when sitting/NWB and states it feels like "needles jabbing" when standing/WBing - provided multiple seated rest breaks for pain management during session.  Balance: Standardized Balance Assessment Standardized Balance Assessment: Berg Balance Test Berg Balance Test Sit to Stand: Able to stand  independently using hands Standing Unsupported: Able to stand 2 minutes with supervision Sitting with Back Unsupported but Feet Supported on Floor or Stool: Able to sit safely and securely 2 minutes Stand to Sit: Sits safely with minimal use of hands Transfers: Able to transfer with verbal cueing and /or supervision Standing Unsupported with Eyes Closed: Able to stand 10 seconds with supervision Standing Ubsupported with Feet Together: Needs help to attain position but able to stand for 30 seconds with feet together (1 minor L LOB while putting feet together but then able to hold for  1 minute) From Standing, Reach Forward with Outstretched Arm: Can reach forward >5 cm safely (2") From Standing Position, Pick up Object from Floor: Unable to pick up shoe, but reaches 2-5 cm (1-2") from shoe and balances independently From Standing Position, Turn to Look Behind Over each Shoulder: Turn sideways only but maintains balance Turn 360  Degrees: Needs assistance while turning (actually able to attempt this time but requires assist to regain balance) Standing Unsupported, Alternately Place Feet on Step/Stool: Able to complete >2 steps/needs minimal assist Standing Unsupported, One Foot in Front: Needs help to step but can hold 15 seconds Standing on One Leg: Unable to try or needs assist to prevent fall Total Score: 28    Therapy/Group: Individual Therapy  Ginny Forth , PT, DPT, NCS, CSRS 11/05/2020, 5:08 PM

## 2020-11-05 NOTE — Progress Notes (Signed)
Patient ID: Cassandra Schaefer, female   DOB: 09/02/1928, 87 y.o.   MRN: [id number] Follow up with the patient regarding pending discharge. Patient noted she feels like she is ready for the next step in her recovery. Looking forward to going home but feels like she has "come along way" since admission and the staff helped " to boost her confidence level". Reports no questions about medications or care at discharge. Has resource materials if needed. Pamelia Hoit, RN

## 2020-11-05 NOTE — Progress Notes (Signed)
Occupational Therapy Session Note  Patient Details  Name: Maryanna Stuber MRN: [id number] Date of Birth: [birth year]  Today's Date: 11/05/2020 OT Individual Time: 1300-1400 OT Individual Time Calculation (min): 60 min    Short Term Goals: Week 1:  OT Short Term Goal 1 (Week 1): STGs=LTGs due to short ELOS  Skilled Therapeutic Interventions/Progress Updates:    SPT throughotu with walker and CGA.v Pt participates in seated warm up exercises with yoga block for cardio endurance/strengthening for UB/LB, hip walking for pelvic mobility, and seated yoga poses for return to leisure and improved BUE coordiantion. Pt completes seated twists, warriors, lunges and cat/cow to connect with breath and body movements. At enf of session OT led pt through C-A-L-M (chest, arms, legs, mind) meditation/body scan provided for pain/anxiety management with education regarding breathwork to stimulate the parasympathetic nervous system. Exited session with pt seated in recliner, exit alarm on and call light in reach  142/45 seated EOM after 2 transfer 138/49   seated EOM after warm up exercises 135/59 after yoga postures and meditation  Therapy Documentation Precautions:  Precautions Precautions: Fall Precaution Comments: R hemipareisis Restrictions Weight Bearing Restrictions: No General:   Vital Signs:  Pain:   ADL:   Vision   Perception    Praxis   Exercises:   Other Treatments:     Therapy/Group: Individual Therapy  Shon Hale 11/05/2020, 12:15 PM

## 2020-11-05 NOTE — Patient Care Conference (Signed)
Inpatient RehabilitationTeam Conference and Plan of Care Update Date: 11/05/2020   Time: 10:43 AM    Patient Name: Cassandra Schaefer [facility]      Medical Record Number: [id number]  Date of Birth: 1928-08-25 Sex: Female         Room/Bed: 4M07C/4M07C-01 Payor Info: Payor: [employer] MEDICARE / Plan: Monia Pouch MEDICARE HMO/PPO / Product Type: *No Product type* /    Admit Date/Time:  10/24/2020 12:58 PM  Primary Diagnosis:  Left basal ganglia embolic stroke [facility])  Hospital Problems: Principal Problem:   Left basal ganglia embolic stroke [facility])    Expected Discharge Date: Expected Discharge Date: 11/07/20  Team Members Present: Physician leading conference: Dr. Claudette Laws Care Coodinator Present: Chana Bode, RN, BSN, CRRN;Christina Vita Barley, BSW Nurse Present: Chana Bode, RN PT Present: Casimiro Needle, PT OT Present: Perrin Maltese, OT SLP Present: Other (comment) Dalbert Mayotte, SLP) PPS Coordinator present : Fae Pippin, SLP     Current Status/Progress Goal Weekly Team Focus  Bowel/Bladder             Swallow/Nutrition/ Hydration             ADL's   Setup for UB selfcare with min contact guard for LB selfcare and transfers.   Still with slight RUE coordination deficits but uses functionally at an active assist level for selfcare tasks.  supervision overall  neuromuscular re-education, balance retraining, transfer training, DME education, therapeutic activities, therapuetic exercises.   Mobility   supervision bed mobility, CGA sit<>stand and stand pivot transfers using RW, CGA/min assist gait up to 168ft using RW, CGA 8 steps using B HRs - pt demos improving standing balance though continues to have significant deficits with increased fall risk  supervision overall at ambulatory level  activity tolerance, transfer training, gait training, dynamic standing balance, R LE NMR, pt education, stair navigation, R UE NMR   Communication   Mod I-to-Sup for speech intelligibility at  conversational level, implementation of speech intelligibility strategeis, and repair of communication breakdowns.  mod I basic expressive, supervision to minA more complex/conversational expressive language, mod I speech intelligiblity  implementation of speech intelligibility strategies at conversational level   Safety/Cognition/ Behavioral Observations            Pain             Skin               Discharge Planning:      Team Discussion: Goit right ankle; MD to administer IM injection and check uric acid level. UTI treated and doing better. Patient on target to meet rehab goals: yes, currently supervision for upper body self care. CGA for transfers with a RW, stand pivot transfers, sit - stand transfers and able to ambulate 160' with a RW. Note decreased coordination in right hand due to arthritis but improved coordination of leg without the high step pattern noted previously and decreased posterior leaning. Able to complete steps with CGA . Discharge goals set for supervision level  *See Care Plan and progress notes for long and short-term goals.   Revisions to Treatment Plan:  Working on speech intelligibility at a conversation level and communication of need Teaching Needs: Transfers, toileting, medications, secondary stroke risk management, etc  Current Barriers to Discharge: Decreased caregiver support and Home enviroment access/layout  Possible Resolutions to Barriers: Family education     Medical Summary Current Status: completing tx for UTI, tx for gout, right ankle  Barriers to Discharge: Medical stability   Possible Resolutions to  Barriers/Weekly Focus: needs im steroid injection for gout   Continued Need for Acute Rehabilitation Level of Care: The patient requires daily medical management by a physician with specialized training in physical medicine and rehabilitation for the following reasons: Direction of a multidisciplinary physical rehabilitation program to  maximize functional independence : Yes Medical management of patient stability for increased activity during participation in an intensive rehabilitation regime.: Yes   I attest that I was present, lead the team conference, and concur with the assessment and plan of the team.   Chana Bode B 11/05/2020, 2:31 PM

## 2020-11-05 NOTE — Progress Notes (Signed)
Occupational Therapy Session Note  Patient Details  Name: Cassandra Schaefer MRN: [id number] Date of Birth: 06-Nov-1928  Today's Date: 11/05/2020 OT Individual Time: 6384-6659 OT Individual Time Calculation (min): 45 min    Short Term Goals: Week 1:  OT Short Term Goal 1 (Week 1): STGs=LTGs due to short ELOS  Skilled Therapeutic Interventions/Progress Updates:  Pt received supine in bed agreeable to OT intervention. Pt currently requires supervision for bed mobility and CGA for functional mobility within pts room with RW. Pt completed toileting with overall CGA for balance when reaching down to pull pants up to waist line. Pt able to stand at sink for oral care and grooming with RW and supervision assist, pt using RUE appropriately during ADLs, good carryover of compensatory methods noted with pt often using RUE as stabilizer when pumping lotion into pts hand. Pt able to retrieve clothes in room and don [place] shirt independently with assistive device, CGA for LB dressing with pt needing mild steadying assist in standing to pull pants up to waist line, set- up assist needed to don shoes from EOB. Pt transported to ADL apartment with total A for energy conservation. Pt completed functional mobility within apartment with pt able to sit<>stand from low recliner and couch with CGA. Pt transported back to room in w/c with total A with pt left up in recliner with alarm belt activated with all needs within reach.   Therapy Documentation Precautions:  Precautions Precautions: Fall Precaution Comments: R hemipareisis Restrictions Weight Bearing Restrictions: No General:   Vital Signs:  Pain: Pt reports increased pain in R foot when initially getting OOB ( pt reports MD think she is having gout flare up), pt reports pain decreasing the more pt moves around.    Therapy/Group: Individual Therapy  [name] [facility] 11/05/2020, 10:20 AM

## 2020-11-06 ENCOUNTER — Inpatient Hospital Stay: Admit: 2020-11-06 | Discharge: 2020-11-06 | Disposition: A | Discharge status: Home / Self Care | Payer: Medicare HMO

## 2020-11-06 ENCOUNTER — Other Ambulatory Visit (INDEPENDENT_AMBULATORY_CARE_PROVIDER_SITE_OTHER): Payer: Self-pay | Admitting: Nurse Practitioner

## 2020-11-06 LAB — GLUCOSE, CAPILLARY
Glucose-Capillary: 113 mg/dL — ABNORMAL HIGH (ref 70–99)
Glucose-Capillary: 113 mg/dL — ABNORMAL HIGH (ref 70–99)
Glucose-Capillary: 145 mg/dL — ABNORMAL HIGH (ref 70–99)
Glucose-Capillary: 154 mg/dL — ABNORMAL HIGH (ref 70–99)

## 2020-11-06 MED ORDER — PREDNISONE 5 MG (21) PO TBPK: 10.0000 mg | ORAL_TABLET | Freq: Every evening | ORAL | Status: DC

## 2020-11-06 MED ORDER — PREDNISONE 5 MG (21) PO TBPK
10.0000 mg | ORAL_TABLET | Freq: Every evening | ORAL | Status: AC
  Administered 2020-11-06: 10 mg via ORAL

## 2020-11-06 MED ORDER — PREDNISONE 5 MG (21) PO TBPK
5.0000 mg | ORAL_TABLET | ORAL | Status: AC
  Administered 2020-11-06: 5 mg via ORAL

## 2020-11-06 MED ORDER — PREDNISONE 5 MG (21) PO TBPK: 5.0000 mg | ORAL_TABLET | Freq: Four times a day (QID) | ORAL | Status: DC

## 2020-11-06 MED ORDER — PREDNISONE 5 MG (21) PO TBPK
10.0000 mg | ORAL_TABLET | Freq: Every morning | ORAL | Status: AC
  Administered 2020-11-06: 10 mg via ORAL
  Filled 2020-11-06 (×2): qty 21

## 2020-11-06 MED ORDER — PREDNISONE 5 MG (21) PO TBPK
5.0000 mg | ORAL_TABLET | Freq: Three times a day (TID) | ORAL | Status: DC
  Administered 2020-11-07: 5 mg via ORAL

## 2020-11-06 NOTE — Progress Notes (Signed)
Speech Language Pathology Discharge Summary  Patient Details  Name: Monae Topping MRN: [id number] Date of Birth: 26-Sep-1928  Today's Date: 11/06/2020 SLP Individual Time: 1000-1030 SLP Individual Time Calculation (min): 30 min   Skilled Therapeutic Interventions: Patient agreeable to skilled SLP intervention with focus on family education. Patient family present and received education on speech intelligibility/communication strategies. Provided patient/family with handout containing dysarthria speaker strategies. Patient returned education through effective demonstration of each strategy (e.g., engage in conversation exchange in quiet/minimally distracting environment, increase breath support, over-exaggerate speech, pause between words). Family demonstrated understanding through teach back. All questions were addressed. Proceed with planned discharge from skilled ST services in anticipation of discharge from facility tomorrow. Patient was left in her wheelchair with alarm activated and needs within reach at end of session.   Patient has met 3 of 3 long term goals.  Patient to discharge at overall Modified Independent level.  Reasons goals not met: NA   Clinical Impression/Discharge Summary: Patient has made excellent gains and has met 3 of 3 LTG's this admission due to improved language function and motor speech production. Patient is currently an overall mod I for communication involving both expressive language and speech intelligibility with occasional cues for implementation of speech intelligibility strategies. Patient is able to effectively communicate her functional needs and engage in conversation with with minimal difficulty and >90% speech intelligibility. She demonstrates carryover of learned strategies to repair speech production as needed. Receptive language and functional cognition WFL. Patient and family education is complete and patient will discharge home with 24 hour supervision  from family. Continued SLP services do not appear clinically indicated at this time.       Care Partner:  Caregiver Able to Provide Assistance: Yes  Type of Caregiver Assistance: Physical  Recommendation:  24 hour supervision/assistance  Rationale for SLP Follow Up: Other (comment) (F/u SLP services do not appear indicated at this time)   Equipment: NA   Reasons for discharge: Treatment goals met;Discharged from hospital   Patient/Family Agrees with Progress Made and Goals Achieved: Yes    Patty Sermons 11/06/2020, 4:33 PM

## 2020-11-06 NOTE — Discharge Summary (Signed)
Physician Discharge Summary  Patient ID: Cassandra Schaefer MRN: [id number] DOB/AGE: Nov 05, 1928 87 y.o.  Admit date: 10/24/2020 Discharge date: 11/07/2020  Discharge Diagnoses:  Principal Problem:   Left basal ganglia embolic stroke [facility]) Active Problems:   Anemia, unspecified   Type 2 diabetes mellitus (HCC)   Gout flare   UTI (urinary tract infection)   Left carotid artery stenosis   Chronic kidney disease   Discharged Condition: good  Significant Diagnostic Studies: N/A   Labs:  Basic Metabolic Panel: BMP Latest Ref Rng & Units 11/05/2020 10/31/2020 10/28/2020  Glucose 70 - 99 mg/dL 630(Z) 601(U) 932(T)  BUN 8 - 23 mg/dL 23 55(D) 32(K)  Creatinine 0.44 - 1.00 mg/dL 0.25(K) 2.70(W) 2.37(S)  Sodium 135 - 145 mmol/L 137 135 135  Potassium 3.5 - 5.1 mmol/L 3.4(L) 4.0 3.8  Chloride 98 - 111 mmol/L 106 104 103  CO2 22 - 32 mmol/L 23 23 24   Calcium 8.9 - 10.3 mg/dL 8.9 9.2 9.2     CBC: CBC Latest Ref Rng & Units 11/05/2020 10/28/2020 10/24/2020  WBC 4.0 - 10.5 K/uL 11.4(H) 8.1 13.3(H)  Hemoglobin 12.0 - 15.0 g/dL 10.9(L) 11.5(L) 11.5(L)  Hematocrit 36.0 - 46.0 % 32.9(L) 35.2(L) 34.1(L)  Platelets 150 - 400 K/uL 236 260 217     CBG: Recent Labs  Lab 11/06/20 0612 11/06/20 1153 11/06/20 1643 11/06/20 2058 11/07/20 0604  GLUCAP 113* 113* 145* 154* 159*    Brief HPI:   Cassandra Schaefer is a 87 y.o. Rh-female with history of T2DM, HTN, CKD 3 who was admitted to [facility] on 10/19/2020 with difficulty walking due to right-sided weakness with sensory deficits as well as word finding deficits.  BP elevated at admission and patient was outside tPA window.  MRI brain showed subacute to old diffusion abnormality left basal ganglia and CTA head/neck showed 70% stenosis left carotid bulb with associated severe stenosis at origin of dominant left VA.  Stroke was felt to be due to carotid artery stenosis and Dr. 10/21/2020 consulted with plans for carotid surgery however she had worsening of symptoms with  lethargy on 06/27. Follow-up MRI showed progression of infarct with increase in size from 5 to 15 m every 51months because then it was like cross coverage this vascular recommended waiting a couple of weeks prior to stenting of left CEA.  Patient with resultant mild dysarthria with word finding deficits as well as right-sided weakness affecting mobility and ADLs.  [name] was recommended due to functional decline.   Hospital Course: Cassandra Schaefer was admitted to rehab 10/24/2020 for inpatient therapies to consist of PT, ST and OT at least three hours five days a week. Past admission physiatrist, therapy team and rehab RN have worked together to provide customized collaborative inpatient rehab.  Her blood pressures were monitored on TID basis and have been well controlled without medications.  Her diabetes has been monitored with ac/hs CBG checks and SSI was use prn for tighter BS control.  Metformin was resumed at admission and renal status has been followed closely.  She did develop acute on chronic renal failure and was encouraged to push p.o. fluids with improvement in her renal status.    She reported dysuria as well as a history of frequent UTIs.  Urine culture done showing greater than 100,000 colonies of Kleb pneumoniae and she was treated with a dose of Rocephin and started on Keflex x5-day course.  She also developed right medial ankle pain which was injected with IM triamcinolone.  Uric acid levels were checked and noted to be elevated at 9.0.  As she did not have any improvement with IM injection she was started on steroid taper and completes this after discharge.  Her p.o. intake has been good and she is continent of bowel and bladder.  She has made steady progress during his stay and is currently at supervision level.  Plans are for carotid endarterectomy in the upcoming week and she was advised to stop Plavix on Saturday.  She will continue to receive outpatient therapy at [facility] after  discharge.   Rehab course: During patient's stay in rehab weekly team conferences were held to monitor patient's progress, set goals and discuss barriers to discharge. At admission, patient required min to max assist for ADL tasks and min assist with mobility. She exhibited mild to moderate expressive aphasia and dysarthria.  She  has had improvement in activity tolerance, balance, postural control as well as ability to compensate for deficits. She has had improvement in functional use RUE  and RLE as well as improvement in awareness. She is independent for UB care and requires supervision for safety with LB ADLs. She requires min assist with toilet/tub transfers.  She requires supervision with rolling walker for ambulation and contact-guard assist to navigate stairs.  Language and speech production have improved and she is able to utilize speech intelligibility strategies independently. Family education was completed regarding all aspects of safety and care.    Disposition: Home  Diet:  Heart Healthy/Carb Modified.   Special Instructions: No driving or strenuous activity till cleared by MD. 2.  Monitor BS more frequently while on steroids.   Discharge Instructions     Ambulatory referral to Physical Medicine Rehab   Complete by: As directed    2-3 weeks f/u appt      Allergies as of 11/07/2020       Reactions   Penicillins Hives   Sulfa Antibiotics Hives   Tetanus Toxoids Swelling   Macrobid [nitrofurantoin] Nausea And Vomiting, Other (See Comments)   HYPOTENSION   Procaine Other (See Comments)   "went into shock"   Tuberculin Tests Swelling, Rash        Medication List     STOP taking these medications    fosinopril 10 MG tablet Commonly known as: MONOPRIL   insulin aspart 100 UNIT/ML injection Commonly known as: novoLOG   iron polysaccharides 150 MG capsule Commonly known as: NIFEREX   metFORMIN 500 MG tablet Commonly known as: GLUCOPHAGE Replaced by: metFORMIN  500 MG 24 hr tablet       TAKE these medications    aspirin 81 MG chewable tablet Chew 1 tablet (81 mg total) by mouth daily.   atorvastatin 80 MG tablet Commonly known as: LIPITOR Take 1 tablet (80 mg total) by mouth daily after supper. What changed: when to take this   cephALEXin 250 MG capsule Commonly known as: KEFLEX Take 1 capsule (250 mg total) by mouth every 8 (eight) hours.   clopidogrel 75 MG tablet Commonly known as: PLAVIX Take 1 tablet (75 mg total) by mouth daily. Notes to patient: Stop taking this starting Sunday or as advised by Dr. Wyn Quaker   glucose blood test strip USE TO TEST BLOOD SUGAR DAILY. DX CODE: E11.9   ICAPS AREDS 2 PO Take by mouth 2 (two) times daily. Reported on 06/23/2015   levothyroxine 88 MCG tablet Commonly known as: SYNTHROID Take 88 mcg by mouth daily before breakfast.   metFORMIN 500 MG 24 hr  tablet Commonly known as: GLUCOPHAGE-XR Take 1 tablet (500 mg total) by mouth at bedtime. Replaces: metFORMIN 500 MG tablet   predniSONE 5 MG (21) Tbpk tablet Commonly known as: STERAPRED UNI-PAK 21 TAB Use as directed   vitamin B-12 1000 MCG tablet Commonly known as: CYANOCOBALAMIN Take 2,000 mcg by mouth daily.        Follow-up Information     Kirsteins, Victorino Sparrow, MD Follow up.   Specialty: Physical Medicine and Rehabilitation Why: office will call you with follow up appointment Contact information: [address] [place] [address] [phone]         Annice Needy, MD. Call.   Specialties: Vascular Surgery, Radiology, Interventional Cardiology Why: for appointment Contact information: [address] [place] [address] [phone]         Barbette Reichmann, MD. Call.   Specialty: Internal Medicine Why: for post hospital follow up and referral to neurology in town. Contact information: [address] [place] [address] [phone]                  Signed: Jacquelynn Cree 11/10/2020, 12:22 AM

## 2020-11-06 NOTE — Progress Notes (Signed)
Occupational Therapy Session Note  Patient Details  Name: Cassandra Schaefer MRN: [id number] Date of Birth: June 27, 1928  Today's Date: 11/06/2020 OT Individual Time: 5501-5868 OT Individual Time Calculation (min): 33 min    Short Term Goals: Week 2:  OT Short Term Goal 1 (Week 2): Continue working on established goals at supervision level.  Skilled Therapeutic Interventions/Progress Updates:    Pt received seated in recliner with RN present for med admin, agreeable to therapy. Session focus on self-care retraining, activity tolerance, RUE NMR/FMC in prep for improved ADL/IADL/func mobility performance + decreased caregiver burden. Stand-pivot with RW with CGA > w/c. Total A w/c transport to and from gym 2/2 time management and energy conservation. Completed 1 game of "[place] Train" with dominoes, pt able to remove dominos from box, deal out to players, and flip on side for game play with S and increased time. Minimal dropping of pieces noted. Pt additionally able to dual task and accurately explain game play to therapist while dealing out pieces. In room, stand-pivot to toilet with CGA and use of grab bar, CGA for LB clothing management and seated pericare. Stand-pivot back to recliner with min HHA and no AD. Pt opts to remain seated for majority of session 2/2 R ankle/foot pain, did not quantify.   Pt left seated in recliner with safety belt alarm engaged, call bell in reach, and all immediate needs met.    Therapy Documentation Precautions:  Precautions Precautions: Fall Precaution Comments: R hemipareisis Restrictions Weight Bearing Restrictions: No  Pain: see session note   ADL: See Care Tool for more details.   Therapy/Group: Individual Therapy  Volanda Napoleon MS, OTR/L  11/06/2020, 6:57 AM

## 2020-11-06 NOTE — Plan of Care (Signed)
  Problem: RH Expression Communication Goal: LTG Patient will verbally express basic/complex needs(SLP) Description: LTG:  Patient will verbally express basic/complex needs, wants or ideas with cues  (SLP) Outcome: Completed/Met Goal: LTG Patient will increase speech intelligibility (SLP) Description: LTG: Patient will increase speech intelligibility at word/phrase/conversation level with cues, % of the time (SLP) Outcome: Completed/Met Goal: LTG Patient will increase word finding of common (SLP) Description: LTG:  Patient will increase word finding of common objects/daily info/abstract thoughts with cues using compensatory strategies (SLP). Outcome: Completed/Met

## 2020-11-06 NOTE — Progress Notes (Signed)
Physical Therapy Session Note  Patient Details  Name: Cassandra Schaefer MRN: [id number] Date of Birth: [birth year]  Today's Date: 11/06/2020 PT Individual Time: 4814-4392 PT Individual Time Calculation (min): 33 min   Short Term Goals: Week 1:  PT Short Term Goal 1 (Week 1): STG = LTG d/t  ELOS PT Short Term Goal 1 - Progress (Week 1): Progressing toward goal Week 2:  PT Short Term Goal 1 (Week 2): = to LTGs based on ELOS  Skilled Therapeutic Interventions/Progress Updates:   Pt received sitting in recliner, and agreeable to PT. Stand pivot transfer to [facility] with supervision assist and RW and increased time due to pain in the R ankle as listed below.   Pt transported to entrance at [facility]. Seated BUE NMR. Chest/shoulder press with 3# bar weight x 10, trunkal rotation x 10 Bil with therapy ball, ball tap to PT with 3# bar weight x 15, mid row with level 2 tband x 12. Cues for attention to grasp on the R side intermittently throughout. Patient returned to room and performed stand pivot to recliner with RW and supervision assist from PT with cues for UE to push from [facility]. Pt left sitting in recliner with call bell in reach and all needs met.        Therapy Documentation Precautions:  Precautions Precautions: Fall Precaution Comments: R hemipareisis Restrictions Weight Bearing Restrictions: No  Pain: 6/10 R ankle. Gout pain.     Therapy/Group: Individual Therapy  Lorie Phenix 11/06/2020, 5:44 PM

## 2020-11-06 NOTE — Progress Notes (Signed)
Patient ID: Cassandra Schaefer, female   DOB: Jan 09, 1929, 87 y.o.   MRN: [id number]  [facility] ordered through [employer]  Lavera Guise, [place] [phone]

## 2020-11-06 NOTE — Progress Notes (Signed)
Occupational Therapy Discharge Summary  Patient Details  Name: Cassandra Schaefer MRN: [id number] Date of Birth: [birth year]  Today's Date: 11/06/2020 OT Individual Time: 0904-1000 OT Individual Time Calculation (min): 56 min   Session Note:  Pt in room at start of session with her daughter's present for education.  Provided education and handouts on FM coordination tasks as well as therapy putty tasks to be completed at home.  Also practiced toilet transfers as well as tub/shower transfers, stepping over the edge with use of the grab bars.  She was able to complete with min assist and her daughter plans on purchasing a seat to use as well for safety.  Discussed technique for sitting to dry off and then drying the floor of the tub before stepping out and transitioning over for dressing tasks.  Based on shower setup at home, she will have to sit in the shower backwards secondary to there being a toilet next to the front end of the shower and there not being enough room to step in.  Returned to the room where her daughter assisted with completion of toilet transfer to toileting tasks at supervision level.  She had slight bowel incontinence her underwear so daughter assisted with clean up and changing of them secondary to time limitations.  Pt was left with the call button and phone in reach and family present.  Patient has met 7 of 7 long term goals due to improved activity tolerance, improved balance, ability to compensate for deficits, functional use of  RIGHT upper and RIGHT lower extremity, and improved coordination.  Patient to discharge at overall Supervision level.  Patient's care partner is independent and requires assistance to provide the necessary physical assistance at discharge.    Reasons goals not met: NA  Recommendation:  Patient will benefit from ongoing skilled OT services in outpatient setting to continue to advance functional skills in the area of BADL and Reduce care partner burden.   Feel she will benefit from continued outpatient OT to progress her balance for functional ADLs as well as increase RUE function to WNLs level.    Equipment: 3:1  Reasons for discharge: treatment goals met and discharge from hospital  Patient/family agrees with progress made and goals achieved: Yes  OT Discharge Precautions/Restrictions  Precautions Precautions: Fall Precaution Comments: R hemipareisis Restrictions Weight Bearing Restrictions: No  Pain  See Pain Flowsheet  ADL ADL Eating: Independent Where Assessed-Eating: Chair Grooming: Independent Where Assessed-Grooming: Chair Upper Body Bathing: Independent Where Assessed-Upper Body Bathing: Chair Lower Body Bathing: Supervision/safety Where Assessed-Lower Body Bathing: Shower, Chair Upper Body Dressing: Independent Where Assessed-Upper Body Dressing: Chair Lower Body Dressing: Supervision/safety Where Assessed-Lower Body Dressing: Chair Toileting: Supervision/safety Where Assessed-Toileting: Bedside Commode Toilet Transfer: Close supervision Toilet Transfer Method: Insurance claims handler Equipment: Bedside commode Tub/Shower Transfer: Close supervison Clinical cytogeneticist Method: Stand pivot Tub/Shower Equipment: Facilities manager: Close supervision Social research officer, government Method: Heritage manager: Radio broadcast assistant Vision Baseline Vision/History: Wears glasses Wears Glasses: At all times Patient Visual Report: No change from baseline Vision Assessment?: No apparent visual deficits Perception  Perception: Within Functional Limits Praxis Praxis: Intact Cognition Overall Cognitive Status: Within Functional Limits for tasks assessed Arousal/Alertness: Awake/alert Attention: Selective;Sustained;Alternating Sustained Attention: Appears intact Selective Attention: Appears intact Alternating Attention: Impaired Alternating Attention Impairment: Verbal  basic;Functional basic Memory: Appears intact Awareness: Appears intact Problem Solving: Appears intact Safety/Judgment: Appears intact Sensation Sensation Light Touch: Impaired Detail Peripheral sensation comments: 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 Hot/Cold: Appears Intact Proprioception: Appears Intact Stereognosis: Not tested Coordination Gross Motor Movements are Fluid and Coordinated: No Fine Motor Movements are Fluid and Coordinated: No Coordination and Movement Description: Slight ataxia still present with finger to nose testing in the LUE.  She uses if functionally at a diminshed level during selfcare tasks. Motor  Motor Motor: Hemiplegia;Ataxia Motor - Discharge Observations: Improved RUE and RLE hemiparesis but still present Mobility  Transfers Sit to Stand: Supervision/Verbal cueing Stand to Sit: Supervision/Verbal cueing  Trunk/Postural Assessment  Cervical Assessment Cervical Assessment: Exceptions to [facility] (forward head) Thoracic Assessment Thoracic Assessment: Exceptions to [facility] (thoracic kyphosis) Lumbar Assessment Lumbar Assessment: Exceptions to [facility] (posterior pelvic tilt)  Balance Balance Balance Assessed: Yes Static Sitting Balance Static Sitting - Balance Support: Feet supported Static Sitting - Level of Assistance: 7: Independent Dynamic Sitting Balance Dynamic Sitting - Balance Support: During functional activity Dynamic Sitting - Level of Assistance: 7: Independent Static Standing Balance Static Standing - Balance Support: During functional activity Static Standing - Level of Assistance: 5: Stand by assistance Dynamic Standing Balance Dynamic Standing - Level of Assistance: 4: Min assist (without assistive device) Extremity/Trunk Assessment RUE Assessment RUE Assessment: Exceptions to [facility] Passive Range of Motion (PROM) Comments: [facility] General Strength Comments: Pt with isolated  movement throughout, shoulder strength 3+/5 with ataxia noted during functional reach.  Decreased FM coordination but able to oppose the thumb to all digits.  Decreased in-hand manipulation noted when trying to manipulate something from palm to fingertips. LUE Assessment LUE Assessment: Within Functional Limits General Strength Comments: 4+/5   Carnita Golob OTR/L 11/06/2020, 5:12 PM

## 2020-11-06 NOTE — Progress Notes (Signed)
Inpatient Rehabilitation Care Coordinator Discharge Note  The overall goal for the admission was met for:   Discharge location: Yes, home  Length of Stay: Yes, 14 Days  Discharge activity level: Yes, supervision  Home/community participation: Yes  Services provided included: MD, RD, PT, OT, SLP, RN, CM, TR, Pharmacy, Neuropsych, and SW  Financial Services: Private Insurance: [employer]  Choices offered to/list presented OF:BPZWCHE and daughters  Follow-up services arranged: Outpatient: [facility] [place] (or additional information): PT OT ST Rolling Walker and Bedside Commode  Patient/Family verbalized understanding of follow-up arrangements: Yes  Individual responsible for coordination of the follow-up plan: [name] [phone]  Confirmed correct DME delivered: Dyanne Iha 11/06/2020    Dyanne Iha

## 2020-11-06 NOTE — Progress Notes (Signed)
Physical Therapy Discharge Summary  Patient Details  Name: Cassandra Schaefer MRN: [id number] Date of Birth: 12-06-1928  Today's Date: 11/06/2020 PT Individual Time: 0805-0903 PT Individual Time Calculation (min): 58 min    Patient has met 8 of 9 long term goals due to improved activity tolerance, improved balance, improved postural control, increased strength, ability to compensate for deficits, functional use of  right upper extremity and right lower extremity, improved attention, improved awareness, and improved coordination.  Patient to discharge at an ambulatory level using RW with Supervision and requiring CGA for stairs.  Patient's care partner attended hands-on education/training and is independent to provide the necessary physical assistance at discharge.  Reasons goals not met: Pt requires CGA for balance on stairs.  Recommendation:  Patient will benefit from ongoing skilled PT services in outpatient setting to continue to advance safe functional mobility, address ongoing impairments in dynamic standing balance, gait training with LRAD, activity tolerance, R LE coordination and NMR, and minimize fall risk.  Equipment: RW  Reasons for discharge: treatment goals met and discharge from hospital  Patient/family agrees with progress made and goals achieved: Yes  Skilled Therapeutic Interventions/Progress Updates:  Pt received supine in bed with her 2 daughters present for hands-on education/training. Pt agreeable to therapy session. Pt preformed the below mobility tasks with the specified levels of assistance. Session focused on family training/education with pt's daughters providing hands-on assistance throughout session and demonstrating excellent technique with min to no cuing. Therapist educated on proper positioning and use of gait belt for mobility tasks. Educated family on monitoring pt's R LE foot clearance during gait, especially on carpet. Therapist educated pt/family on  recommendation for follow-up OPPT - pt/family in agreement. Pt's daughters report no additional questions/concerns. Pt left sitting in w/c with her daughters present and hand-off to OT.    PT Discharge Precautions/Restrictions Precautions Precautions: Fall;Other (comment) Precaution Comments: R hemipareisis Restrictions Weight Bearing Restrictions: No Pain Pain Assessment Pain Scale: Faces Faces Pain Scale: Hurts a little bit Pain Type: Acute pain Pain Location: Ankle Pain Orientation: Right Pain Descriptors / Indicators: Aching Pain Onset: On-going Pain Intervention(s): Rest;Repositioned Vision/Perception  Perception Perception: Within Functional Limits Praxis Praxis: Intact  Cognition Overall Cognitive Status: Within Functional Limits for tasks assessed Arousal/Alertness: Awake/alert Orientation Level: Oriented X4 Attention: Focused;Sustained;Selective Focused Attention: Appears intact Sustained Attention: Appears intact Selective Attention: Appears intact Awareness: Appears intact Problem Solving: Appears intact Safety/Judgment: Appears intact Sensation Sensation Light Touch: Impaired Detail Peripheral sensation comments: Pt exhibits peripheral 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 Hot/Cold: Not tested Proprioception: Appears Intact Stereognosis: Not tested Coordination Gross Motor Movements are Fluid and Coordinated: No Fine Motor Movements are Fluid and Coordinated: No Coordination and Movement Description: Continues to have ataxic movements in R LE resulting in impaired coordination though improved significantly from eval Motor  Motor Motor: Hemiplegia;Ataxia Motor - Discharge Observations: Improved RUE and RLE hemiparesis but still present  Mobility Bed Mobility Bed Mobility: Supine to Sit;Sit to Supine Supine to Sit: Supervision/Verbal cueing Sit to Supine: Supervision/Verbal cueing Transfers Transfers: Sit to  Stand;Stand to Sit;Stand Pivot Transfers Sit to Stand: Supervision/Verbal cueing Stand to Sit: Supervision/Verbal cueing Stand Pivot Transfers: Supervision/Verbal cueing Stand Pivot Transfer Details: Verbal cues for technique;Verbal cues for safe use of DME/AE Transfer (Assistive device): Rolling walker Locomotion  Gait Ambulation: Yes Gait Assistance: Supervision/Verbal cueing Gait Distance (Feet): 150 Feet Assistive device: Rolling walker Gait Assistance Details: Verbal cues for safe use of DME/AE;Verbal cues for gait pattern Gait  Gait: Yes Gait Pattern: Impaired Gait Pattern: Step-through pattern;Poor foot clearance - right;Ataxic Gait velocity: decreased Stairs / Additional Locomotion Stairs: Yes Stairs Assistance: Contact Guard/Touching assist Stair Management Technique: Two rails Number of Stairs: 8 Height of Stairs: 6 Ramp: Contact Guard/touching assist Curb: Contact Guard/Touching assist Wheelchair Mobility Wheelchair Mobility: No  Trunk/Postural Assessment  Cervical Assessment Cervical Assessment: Within Functional Limits Thoracic Assessment Thoracic Assessment: Exceptions to [facility] (mild thoracic kyphosis) Lumbar Assessment Lumbar Assessment: Exceptions to [facility] (posterior pelvic tilt)  Balance Balance Balance Assessed: Yes Standardized Balance Assessment Standardized Balance Assessment: Berg Balance Test Berg Balance Test Sit to Stand: Able to stand  independently using hands Standing Unsupported: Able to stand 2 minutes with supervision Sitting with Back Unsupported but Feet Supported on Floor or Stool: Able to sit safely and securely 2 minutes Stand to Sit: Sits safely with minimal use of hands Transfers: Able to transfer with verbal cueing and /or supervision Standing Unsupported with Eyes Closed: Able to stand 10 seconds with supervision Standing Ubsupported with Feet Together: Needs help to attain position but able to stand for 30 seconds with feet together  (1 minor L LOB while putting feet together but then able to hold for 1 minute) From Standing, Reach Forward with Outstretched Arm: Can reach forward >5 cm safely (2") From Standing Position, Pick up Object from Floor: Unable to pick up shoe, but reaches 2-5 cm (1-2") from shoe and balances independently From Standing Position, Turn to Look Behind Over each Shoulder: Turn sideways only but maintains balance Turn 360 Degrees: Needs assistance while turning (actually able to attempt this time but requires assist to regain balance) Standing Unsupported, Alternately Place Feet on Step/Stool: Able to complete >2 steps/needs minimal assist Standing Unsupported, One Foot in Front: Needs help to step but can hold 15 seconds Standing on One Leg: Unable to try or needs assist to prevent fall Total Score: 28 Static Sitting Balance Static Sitting - Balance Support: Feet supported Static Sitting - Level of Assistance: 7: Independent Dynamic Sitting Balance Dynamic Sitting - Balance Support: During functional activity Dynamic Sitting - Level of Assistance: 7: Independent Static Standing Balance Static Standing - Balance Support: During functional activity;Bilateral upper extremity supported Static Standing - Level of Assistance: 5: Stand by assistance Dynamic Standing Balance Dynamic Standing - Balance Support: Bilateral upper extremity supported;During functional activity Dynamic Standing - Level of Assistance: Other (comment);5: Stand by assistance (CGA) Extremity Assessment      RLE Assessment RLE Assessment: Exceptions to [facility] Active Range of Motion (AROM) Comments: [facility] RLE Strength Right Hip Flexion: 4/5 Right Hip Extension: 4/5 Right Hip ABduction: 4/5 Right Hip ADduction: 4/5 Right Knee Flexion: 4/5 Right Knee Extension: 4/5 Right Ankle Dorsiflexion: 4/5 Right Ankle Plantar Flexion: 4/5 LLE Assessment LLE Assessment: Exceptions to [facility] Active Range of Motion (AROM) Comments: [facility] LLE  Strength Left Hip Flexion: 4/5 Left Hip Extension: 4/5 Left Hip ABduction: 4/5 Left Hip ADduction: 4/5 Left Knee Flexion: 4/5 Left Knee Extension: 4/5 Left Ankle Dorsiflexion: 4/5 Left Ankle Plantar Flexion: 4/5    Uriel Horkey M Verlon Pischke , PT, DPT, NCS, CSRS 11/06/2020, 7:52 AM

## 2020-11-06 NOTE — Progress Notes (Signed)
PROGRESS NOTE   Subjective/Complaints:  Pt excited about d/c in am   ROS: Patient denies CP SOB N/V/D Objective:   No results found. Recent Labs    11/05/20 0519  WBC 11.4*  HGB 10.9*  HCT 32.9*  PLT 236    Recent Labs    11/05/20 0519  NA 137  K 3.4*  CL 106  CO2 23  GLUCOSE 116*  BUN 23  CREATININE 1.55*  CALCIUM 8.9     Intake/Output Summary (Last 24 hours) at 11/06/2020 0732 Last data filed at 11/05/2020 1845 Gross per 24 hour  Intake 637 ml  Output --  Net 637 ml         Physical Exam: Vital Signs Blood pressure (!) 136/56, pulse 62, temperature 98.2 F (36.8 C), temperature source Oral, resp. rate 16, height 5\' 3"  (1.6 m), weight 57.6 kg, SpO2 96 %.  General: No acute distress Mood and affect are appropriate Heart: Regular rate and rhythm no rubs murmurs or extra sounds Lungs: Clear to auscultation, breathing unlabored, no rales or wheezes Abdomen: Positive bowel sounds, soft nontender to palpation, nondistended Extremities: No clubbing, cyanosis, or edema Skin: No evidence of breakdown, no evidence of rash   Neuro: Pt is alert and oriented. Phonation,speech improving.  Right central 7. RUE 4/5 all muscle groups  RLE 4-/5.   Sensation equal,  Cerebellar- dysmetria RUE and RLE Musculoskeletal:RIght medial malleolar area erythematous, moderately tender to light pressure Mild pain with PROM    Assessment/Plan: 1. Functional deficits which require 3+ hours per day of interdisciplinary therapy in a comprehensive inpatient rehab setting. Physiatrist is providing close team supervision and 24 hour management of active medical problems listed below. Physiatrist and rehab team continue to assess barriers to discharge/monitor patient progress toward functional and medical goals  Care Tool:  Bathing  Bathing activity did not occur: Refused (per OT eval) Body parts bathed by patient: Face   Body  parts bathed by helper: Buttocks Body parts n/a: Front perineal area, Buttocks, Right upper leg, Left upper leg, Left lower leg, Right lower leg (did not attempt this session)   Bathing assist Assist Level: Supervision/Verbal cueing     Upper Body Dressing/Undressing Upper body dressing   What is the patient wearing?: Pull over shirt    Upper body assist Assist Level: Independent with assistive device Assistive Device Comment: RW  Lower Body Dressing/Undressing Lower body dressing      What is the patient wearing?: Pants, Underwear/pull up     Lower body assist Assist for lower body dressing: Contact Guard/Touching assist     Toileting Toileting    Toileting assist Assist for toileting: Contact Guard/Touching assist     Transfers Chair/bed transfer  Transfers assist     Chair/bed transfer assist level: Contact Guard/Touching assist Chair/bed transfer assistive device:   Ambulation assist      Assist level: Contact Guard/Touching assist Assistive device: Walker-rolling Max distance: 134ft   Walk 10 feet activity   Assist     Assist level: Contact Guard/Touching assist Assistive device: Walker-rolling   Walk 50 feet activity   Assist    Assist level: Contact Guard/Touching  assist Assistive device: Walker-rolling    Walk 150 feet activity   Assist Walk 150 feet activity did not occur: Safety/medical concerns  Assist level: Contact Guard/Touching assist Assistive device: Walker-rolling    Walk 10 feet on uneven surface  activity   Assist Walk 10 feet on uneven surfaces activity did not occur: Safety/medical concerns   Assist level: Moderate Assistance - Patient - 50 - 74% Assistive device: Photographer Will patient use wheelchair at discharge?: No Type of Wheelchair: Manual Wheelchair activity did not occur: N/A         Wheelchair 50 feet with 2 turns  activity    Assist    Wheelchair 50 feet with 2 turns activity did not occur: N/A       Wheelchair 150 feet activity     Assist  Wheelchair 150 feet activity did not occur: N/A       Blood pressure (!) 136/56, pulse 62, temperature 98.2 F (36.8 C), temperature source Oral, resp. rate 16, height 5\' 3"  (1.6 m), weight 57.6 kg, SpO2 96 %.  Medical Problem List and Plan: 1.  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  Impaired mobility, ambulating 150 feet, d/c Lovenox             -antiplatelet therapy: ASA and plavix 3. Mild right sided neuropathic pain: Continue Tylenol prn 4. Mood: LCSW to follow for evaluation and support             -antipsychotic agents: N/AA 5. Neuropsych: This patient is capable of making decisions on her own behalf. 6. Skin/Wound Care: Routine pressure-relief measures 7. Fluids/Electrolytes/Nutrition: Monitor intake/output.   --I personally reviewed the patient's labs today.   -encourage adequate liquid intake 8.  HTN: Monitor blood pressures 3 times daily.  Avoid hypotension. Vitals:   11/05/20 2001 11/06/20 0612  BP: (!) 123/42 (!) 136/56  Pulse: 66 62  Resp: 19 16  Temp: 98.2 F (36.8 C) 98.2 F (36.8 C)  SpO2: 100% 96%   In good range 7/14 9.  T2DM: Continue metformin.  Monitor blood sugars ac/hs             --Use SSI for elevated blood sugar and titrate medication as indicated.  CBG (last 3)  Recent Labs    11/05/20 1736 11/05/20 2108 11/06/20 0612  GLUCAP 142* 134* 113*   Controlled 7/14 Metformin XR 500mg  daily 10. L- CAS: Continue DAPT--Plavix to stop 5 days prior to procedure.             -- Follow-up with Dr. 8/14 for surgery on outpatient basis. 11.  CKD: Baseline SCR 1.4-1.7 range, some acute  insufficiency superimposed             -BUN/Cr stable to improved from 7/5--continue to encourage fluids 12.  Dysuria- +WBC but rare bacteria, CX with 100k GNR 7/8- Kleb P  -pt with PCN/sulfa allergies. D/w pharmacy and will give dose of rocephin, sensitive Keflex x 5d 13.  RIght medial ankle pain, pt states she has hx gout, will check urate level , WBC at baseline, doubt infx, give IM triamcinolone x  1  Would likely benefit from allopurinol except that pt has elevated creat LOS: 13 days A FACE TO FACE EVALUATION WAS PERFORMED  Erick Colace 11/06/2020, 7:32 AM

## 2020-11-07 ENCOUNTER — Encounter
Admit: 2020-11-07 | Discharge: 2020-11-07 | Disposition: A | Discharge status: Home / Self Care | Payer: Medicare HMO | Attending: Vascular Surgery | Admitting: Vascular Surgery

## 2020-11-07 HISTORY — DX: Cerebral infarction, unspecified: I63.9

## 2020-11-07 LAB — GLUCOSE, CAPILLARY: Glucose-Capillary: 159 mg/dL — ABNORMAL HIGH (ref 70–99)

## 2020-11-07 MED ORDER — PREDNISONE 5 MG (21) PO TBPK: ORAL_TABLET | ORAL | Status: DC

## 2020-11-07 MED ORDER — CEPHALEXIN 250 MG PO CAPS: 250.0000 mg | ORAL_CAPSULE | Freq: Three times a day (TID) | ORAL | 0 refills | Status: DC

## 2020-11-07 MED ORDER — CLOPIDOGREL BISULFATE 75 MG PO TABS: 75.0000 mg | ORAL_TABLET | Freq: Every day | ORAL | 0 refills | Status: DC

## 2020-11-07 MED ORDER — METFORMIN HCL ER 500 MG PO TB24: 500.0000 mg | ORAL_TABLET | Freq: Every day | ORAL | 0 refills | Status: AC

## 2020-11-07 MED ORDER — ATORVASTATIN CALCIUM 80 MG PO TABS: 80.0000 mg | ORAL_TABLET | Freq: Every day | ORAL | 0 refills | Status: AC

## 2020-11-07 NOTE — Patient Instructions (Addendum)
Your procedure is scheduled on: Thursday November 13, 2020 Report to the Registration Desk on the 1st floor of the [facility] To find out your arrival time, please call [phone] between 1PM - 3PM on: Wednesday November 12, 2020  REMEMBER: Instructions that are not followed completely may result in serious medical risk, up to and including death; or upon the discretion of your surgeon and anesthesiologist your surgery may need to be rescheduled.  DO NOT EAT OR DRINK after midnight the night before surgery.  No gum chewing, lozengers or hard candies.  TAKE THESE MEDICATIONS THE MORNING OF SURGERY WITH A SIP OF WATER: SYNTHROID   Stop Metformin 2 days prior to surgery. LAST DOSE IS November 11, 2019 MONDAY  MAY CONTINUE ASPIRIN UP TO DAY OF SURGERY. STOP CLOPIDOGREL FOR 5 DAYS BEFORE SURGERY LAST DOSE 11/07/2020  One week prior to surgery: Stop Anti-inflammatories (NSAIDS) such as Advil, Aleve, Ibuprofen, Motrin, Naproxen, Naprosyn and Aspirin based products such as Excedrin, Goodys Powder, BC Powder. Stop ANY OVER THE COUNTER supplements until after surgery. You may however, continue to take Tylenol if needed for pain up until the day of surgery.  No Alcohol for 24 hours before or after surgery.  No Smoking including e-cigarettes for 24 hours prior to surgery.  No chewable tobacco products for at least 6 hours prior to surgery.  No nicotine patches on the day of surgery.  Do not use any "recreational" drugs for at least a week prior to your surgery.  Please be advised that the combination of cocaine and anesthesia may have negative outcomes, up to and including death. If you test positive for cocaine, your surgery will be cancelled.  On the morning of surgery brush your teeth with toothpaste and water, you may rinse your mouth with mouthwash if you wish. Do not swallow any toothpaste or mouthwash.  Do not wear jewelry, make-up, hairpins, clips or nail polish.  Do not wear lotions,  powders, or perfumes NO DEODORANT   Do not shave body from the neck down 48 hours prior to surgery just in case you cut yourself which could leave a site for infection.  Also, freshly shaved skin may become irritated if using the CHG soap.  Contact lenses, hearing aids and dentures may not be worn into surgery.  Do not bring valuables to the hospital. [facility] is not responsible for any missing/lost belongings or valuables.   Use CHG Soap as directed on instruction sheet.  Notify your doctor if there is any change in your medical condition (cold, fever, infection).  Wear comfortable clothing (specific to your surgery type) to the hospital.  After surgery, you can help prevent lung complications by doing breathing exercises.  Take deep breaths and cough every 1-2 hours. Your doctor may order a device called an Incentive Spirometer to help you take deep breaths. When coughing or sneezing, hold a pillow firmly against your incision with both hands. This is called "splinting." Doing this helps protect your incision. It also decreases belly discomfort.  If you are being admitted to the hospital overnight, YOU CAN BRING A SMALL   If you are being discharged the day of surgery, you will not be allowed to drive home. You will need a responsible adult (18 years or older) to drive you home and stay with you that night.   If you are taking public transportation, you will need to have a responsible adult (18 years or older) with you. Please confirm with your physician  that it is acceptable to use public transportation.   Please call the [place] [facility] at [phone] if you have any questions about these instructions.  Surgery Visitation Policy:  Patients undergoing a surgery or procedure may have one family member or support person with them as long as that person is not COVID-19 positive or experiencing its symptoms.  That person may remain in the waiting area during the  procedure.  Inpatient Visitation:    Visiting hours are 7 a.m. to 8 p.m. Inpatients will be allowed two visitors daily. The visitors may change each day during the patient's stay. No visitors under the age of 6. Any visitor under the age of 72 must be accompanied by an adult. The visitor must pass COVID-19 screenings, use hand sanitizer when entering and exiting the patient's room and wear a mask at all times, including in the patient's room. Patients must also wear a mask when staff or their visitor are in the room. Masking is required regardless of vaccination status.

## 2020-11-07 NOTE — Progress Notes (Signed)
Patient discharged home with daughter this morning. Information packet given to patient per PA, no questions noted. Taken down via wheelchair. Vic Ripper

## 2020-11-10 ENCOUNTER — Encounter: Payer: Self-pay | Admitting: Physical Medicine & Rehabilitation

## 2020-11-10 ENCOUNTER — Ambulatory Visit: Payer: Medicare HMO | Attending: Internal Medicine

## 2020-11-10 ENCOUNTER — Other Ambulatory Visit: Payer: Self-pay

## 2020-11-10 DIAGNOSIS — M6281 Muscle weakness (generalized): Secondary | ICD-10-CM | POA: Insufficient documentation

## 2020-11-10 DIAGNOSIS — N390 Urinary tract infection, site not specified: Secondary | ICD-10-CM

## 2020-11-10 DIAGNOSIS — R482 Apraxia: Secondary | ICD-10-CM | POA: Insufficient documentation

## 2020-11-10 DIAGNOSIS — R2681 Unsteadiness on feet: Secondary | ICD-10-CM | POA: Insufficient documentation

## 2020-11-10 DIAGNOSIS — R278 Other lack of coordination: Secondary | ICD-10-CM | POA: Insufficient documentation

## 2020-11-10 DIAGNOSIS — I6522 Occlusion and stenosis of left carotid artery: Secondary | ICD-10-CM

## 2020-11-10 DIAGNOSIS — R2689 Other abnormalities of gait and mobility: Secondary | ICD-10-CM | POA: Insufficient documentation

## 2020-11-10 DIAGNOSIS — M109 Gout, unspecified: Secondary | ICD-10-CM

## 2020-11-10 DIAGNOSIS — R4701 Aphasia: Secondary | ICD-10-CM | POA: Insufficient documentation

## 2020-11-10 DIAGNOSIS — I639 Cerebral infarction, unspecified: Secondary | ICD-10-CM | POA: Insufficient documentation

## 2020-11-10 DIAGNOSIS — N189 Chronic kidney disease, unspecified: Secondary | ICD-10-CM

## 2020-11-10 DIAGNOSIS — R471 Dysarthria and anarthria: Secondary | ICD-10-CM | POA: Insufficient documentation

## 2020-11-10 NOTE — Therapy (Signed)
[facility] MAIN [facility] SERVICES [address] [place], [place], [address] Phone: [phone]   Fax:  [phone]  Occupational Therapy Evaluation  Patient Details  Name: Cassandra Schaefer MRN: [id number] Date of Birth: 03/22/1929 Referring Provider (OT): Dr. Barbette Reichmann   Encounter Date: 11/10/2020   OT End of Session - 11/10/20 1728     Visit Number 1    Number of Visits 24    Date for OT Re-Evaluation 02/01/21    Authorization Time Period Reporting period starting 11/10/2020    OT Start Time 1600    OT Stop Time 1650    OT Time Calculation (min) 50 min    Equipment Utilized During Treatment wc    Activity Tolerance Patient tolerated treatment well    Behavior During Therapy WFL for tasks assessed/performed             Past Medical History:  Diagnosis Date   Anemia    Cough    RESOLVING, NO FEVER   Diabetes mellitus without complication (HCC)    Gout    Hypertension    Hypothyroidism    PUD (peptic ulcer disease)    Stroke [facility])    Wears dentures    full upper, partial lower    Past Surgical History:  Procedure Laterality Date   AMPUTATION TOE Left 12/14/2017   Procedure: AMPUTATION TOE-4TH MPJ;  Surgeon: Gwyneth Revels, DPM;  Location: [facility];  Service: Podiatry;  Laterality: Left;  IVA LOCAL Diabetic - oral meds   APPENDECTOMY     CATARACT EXTRACTION W/PHACO Right 06/23/2015   Procedure: CATARACT EXTRACTION PHACO AND INTRAOCULAR LENS PLACEMENT (IOC);  Surgeon: Sallee Lange, MD;  Location: [facility] OR[facility];  Service: Ophthalmology;  Laterality: Right;  [place] 01:28 AP% 23.4 CDE 36.14 fluid pack lot # [id number]   CATARACT EXTRACTION W/PHACO Left 07/28/2015   Procedure: CATARACT EXTRACTION PHACO AND INTRAOCULAR LENS PLACEMENT (IOC);  Surgeon: Sallee Lange, MD;  Location: [facility] OR[facility];  Service: Ophthalmology;  Laterality: Left;  [place]    1:29.7 AP%  24.9 CDE   41.32 fluid casette lot #[id number] H exp 09/23/2016    COONOSCOPY     AND ENDOSCOPY   DILATION AND CURETTAGE OF UTERUS     TONSILLECTOMY     TUBAL LIGATION      There were no vitals filed for this visit.   Subjective Assessment - 11/10/20 1708     Subjective  "I'm doing better, but I really want to improve my aim with my R arm."    Patient is accompanied by: Family member    Pertinent History R ankle pain, gout, T2DM, HTN, CKDIII; R/L carotid endarectomy on Thurs.    Patient Stated Goals "I want to improve my aim when I'm using my R arm."    Currently in Pain? No/denies    Pain Score 0-No pain               [facility] OT Assessment - 11/10/20 0001       Assessment   Medical Diagnosis L basal ganglia embolic stroke    Referring Provider (OT) Dr. Barbette Reichmann    Onset Date/Surgical Date 10/19/20    Hand Dominance Right    Prior Therapy [name]      Precautions   Precautions Fall      Balance Screen   Has the patient fallen in the past 6 months No    Is the patient reluctant to leave their home because of a fear of  falling?  No      Home  Environment   Family/patient expects to be discharged to: Private residence    Living Arrangements Children    Available Help at Discharge Family   pt has large family who ensure pt has 24 hr supv   Type of Home House    Home Access Ramped entrance    Home Layout Full bath on main level    Bathroom Toilet Handicapped height    Bathroom Accessibility Yes    How accessible Accessible via walker    Home Equipment Walker - 2 wheels;Bedside commode;Shower seat;Toilet riser;Grab bars - toilet;Grab bars - tub/shower;Hand held shower head    Lives With Family   lives with daugther, French Anaracy, but other family members take shifts when daughter works.  Multiple family members live close by.     Prior Function   Level of Independence Independent    Vocation Retired    [employer] retired Pension scheme managerspecial education teacher    Leisure play cards, dominoes, go to movies      ADL   Eating/Feeding Set  up   difficulty/extra time to cut food   Upper Body Dressing Set up    Upper Body Dressing Details assist to hook bra/clothing fasteners    Lower Body Dressing Min guard   CGA for balance while hiking pants or reaching to Actor[employer]   Toilet Transfer Min guard      Mobility   Mobility Status Needs assist    Mobility Status Comments pt uses walker with gait belt and CGA from family member for household distances; wc for community mobility      Written Expression   Dominant Hand Right    Handwriting 75% legible;Increased time      Vision - History   Baseline Vision Wears glasses all the time    Additional Comments pt feels her vision has actually improved since her CVA      Vision Assessment   Ocular Range of Motion Within Functional Limits    Tracking/Visual Pursuits Able to track stimulus in all quads without difficulty    Saccades Within functional limits    Visual Fields No apparent deficits      Cognition   Overall Cognitive Status Within Functional Limits for tasks assessed      Observation/Other Assessments   Skin Integrity no reported skin breakdown    Focus on Therapeutic Outcomes (FOTO)  55      Sensation   Light Touch Impaired by gross assessment   pt has peripheral neuropathy, worse in R hand but this was present prior to CVA     Coordination   Gross Motor Movements are Fluid and Coordinated No    Fine Motor Movements are Fluid and Coordinated No    Right 9 Hole Peg Test 2 min, 23 sec    Left 9 Hole Peg Test 51 secs      AROM   Overall AROM Comments BUEs WFL      Strength   Overall Strength Comments bilat shoulder/elbow/wrists 4/5      Hand Function   Right Hand Grip (lbs) 20    Right Hand Lateral Pinch 10 lbs    Right Hand 3 Point Pinch 8 lbs    Left Hand Grip (lbs) 25    Left Hand Lateral Pinch 12 lbs    Left 3 point pinch 9 lbs            Occupational Therapy Evaluation: Pt is a 87 y.o. female  with history of T2DM, HTN, CKD 3 who was admitted to [facility]  on 10/19/2020 with difficulty walking due to right-sided weakness with sensory deficits as well as word finding deficits.  Pt was discharged to [name] where she stayed 10 days before returning home with family.  CVA d/t carotid stenosis.  Pt has bilat carotid endarectomy scheduled for Thurs. 11/13/20.  Pt lives with daughter in split level home.  Pt has large family, all very supportive, and family ensures pt has 24 hr supv.  Prior to CVA, pt was indep with all self care, driving, and was active at a local senior center 2 days a week for exercise classes.  Pt reports her main complaint is that her "aim is off" in her R dominant arm, and she wants to be able to reach for and pick up thinks without dropping or knocking things over.  Grossly bilat shoulders, elbows, wrists are equal in strength at 4/5, R hand grip and pinch are slightly weaker than L, and GMC/FMC movements throughout RUE are moderately ataxic.  Family provides CGA for all functional mobility with use of RW d/t impaired balance.  Pt will benefit from skilled OT to provide neuro re-ed to improve strength and coordination throughout RUE, dynamic standing balance activities, and self care training in order to work towards return to [facility] and reduce caregiver burden on family members.      OT Education - 11/10/20 1728     Education Details Role of OT/goals/poc    Person(s) Educated Patient;Child(ren)    Methods Explanation    Comprehension Verbalized understanding;Need further instruction              OT Short Term Goals - 11/10/20 1730       OT SHORT TERM GOAL #1   Title Pt will perform HEP for RUE strength/coordination independently.    Baseline Eval: HEP not yet established in outpatient, but pt is working with putty from [name]    Time 6    Period Weeks    Status New    Target Date 12/21/20               OT Long Term Goals - 11/10/20 1731       OT LONG TERM GOAL #1   Title Pt will improve hand writing to 100% legibility  with R hand to be able to independently write a check.    Baseline Eval: signature is 75% legible with R hand, requiring extra time    Time 12    Period Weeks    Status New    Target Date 02/01/21      OT LONG TERM GOAL #2   Title Pt will improve R hand coordination to enable good manipulation of iphone using R dominant hand.    Baseline Eval: Pt uses L hand d/t R hand lacking sufficient FMC to press buttons on phone    Time 12    Period Weeks    Status New    Target Date 02/01/21      OT LONG TERM GOAL #3   Title Pt will improve GMC throughout RUE to enable pt to reach for and pick up ADL supplies with R dominant hand without dropping or knocking over objects.    Baseline Eval: Pt reports RUE is clumsy and easily knocks over objects when trying to reach for ADL supplies.  Pt verbalizes that her "aim is off."    Time 12    Period Weeks  Status New    Target Date 02/01/21      OT LONG TERM GOAL #4   Title Pt will improve FOTO score to 68 or better to indicate measurable functional improvement.    Baseline Eval: FOTO 55    Time 12    Period Weeks    Status New    Target Date 02/01/21      OT LONG TERM GOAL #5   Title Pt will perform dynamic standing tasks with modified indep, AD as needed in order to reduce fall risk with ADLs    Baseline Eval: CGA-min A for all dynamic standing tasks using RW    Time 12    Period Weeks    Status New    Target Date 02/01/21              Plan - 11/10/20 1748     Clinical Impression Statement Pt is a 87 y.o. female with history of T2DM, HTN, CKD 3 who was admitted to [facility] on 10/19/2020 with difficulty walking due to right-sided weakness with sensory deficits as well as word finding deficits.  Pt was discharged to [name] where she stayed 10 days before returning home with family.  CVA d/t carotid stenosis.  Pt has bilat carotid endarectomy scheduled for Thurs. 11/13/20.  Pt lives with daughter in split level home.  Pt has large family, all  very supportive, and family ensures pt has 24 hr supv.  Prior to CVA, pt was indep with all self care, driving, and was active at a local senior center 2 days a week for exercise classes.  Pt reports her main complaint is that her "aim is off" in her R dominant arm, and she wants to be able to reach for and pick up thinks without dropping or knocking things over.  Grossly bilat shoulders, elbows, wrists are equal in strength at 4/5, R hand grip and pinch are slightly weaker than L, and GMC/FMC movements throughout RUE are moderately ataxic.  Family provides CGA for all functional mobility with use of RW d/t impaired balance.  Pt will benefit from skilled OT to provide neuro re-ed to improve strength and coordination throughout RUE, dynamic standing balance activities, and self care training in order to work towards return to [facility] and reduce caregiver burden on family members.    OT Occupational Profile and History Detailed Assessment- Review of Records and additional review of physical, cognitive, psychosocial history related to current functional performance    Occupational performance deficits (Please refer to evaluation for details): ADL's;Leisure;IADL's    Body Structure / Function / Physical Skills ADL;Coordination;Endurance;GMC;UE functional use;Balance;Sensation;Body mechanics;IADL;Pain;Dexterity;FMC;Strength;Gait;Mobility    Rehab Potential Good    Clinical Decision Making Several treatment options, min-mod task modification necessary    Comorbidities Affecting Occupational Performance: May have comorbidities impacting occupational performance    Modification or Assistance to Complete Evaluation  Min-Moderate modification of tasks or assist with assess necessary to complete eval    OT Frequency 2x / week    OT Duration 12 weeks    OT Treatment/Interventions Self-care/ADL training;Therapeutic exercise;DME and/or AE instruction;Functional Mobility Training;Balance training;Neuromuscular  education;Therapeutic activities;Patient/family education    Consulted and Agree with Plan of Care Patient;Family member/caregiver    Family Member Consulted daughter, French Ana             Patient will benefit from skilled therapeutic intervention in order to improve the following deficits and impairments:   Body Structure / Function / Physical Skills: ADL, Coordination, Endurance, GMC, UE functional  use, Balance, Sensation, Body mechanics, IADL, Pain, Dexterity, FMC, Strength, Gait, Mobility       Visit Diagnosis: Left basal ganglia embolic stroke [facility])  Other lack of coordination  Muscle weakness (generalized)    Problem List Patient Active Problem List   Diagnosis Date Noted   Gout flare 11/10/2020   UTI (urinary tract infection) 11/10/2020   Left carotid artery stenosis 11/10/2020   Chronic kidney disease 11/10/2020   Left basal ganglia embolic stroke (HCC) 10/24/2020   PAC (premature atrial contraction)    Pre-procedural cardiovascular examination    Ischemic stroke (HCC) 10/20/2020   TIA (transient ischemic attack) 10/19/2020   Right hemiparesis (HCC) 10/19/2020   Type 2 diabetes mellitus with stage 3 chronic kidney disease, without long-term current use of insulin (HCC) 08/24/2018   Hyperlipidemia, unspecified 07/19/2017   Hypertension 07/19/2017   PUD (peptic ulcer disease) 07/19/2017   Type 2 diabetes mellitus (HCC) 07/19/2017   Personal history of gout 08/12/2016   Anemia, unspecified 04/09/2016   Hypothyroidism, acquired 12/10/2015   Danelle Earthly, MS, OTR/L  [employer] 11/10/2020, 5:52 PM  [facility] MAIN [facility] SERVICES [address] [place], [place], [address] Phone: [phone]   Fax:  [phone]  Name: Cassandra Schaefer MRN: [id number] Date of Birth: [birth year]

## 2020-11-11 ENCOUNTER — Encounter
Admission: RE | Admit: 2020-11-11 | Discharge: 2020-11-11 | Disposition: A | Discharge status: Home / Self Care | Payer: Medicare HMO | Source: Ambulatory Visit | Attending: Vascular Surgery | Admitting: Vascular Surgery

## 2020-11-11 ENCOUNTER — Ambulatory Visit: Payer: Medicare HMO | Admitting: Speech Pathology

## 2020-11-11 DIAGNOSIS — Z20822 Contact with and (suspected) exposure to covid-19: Secondary | ICD-10-CM | POA: Insufficient documentation

## 2020-11-11 DIAGNOSIS — R471 Dysarthria and anarthria: Secondary | ICD-10-CM

## 2020-11-11 DIAGNOSIS — R4701 Aphasia: Secondary | ICD-10-CM

## 2020-11-11 DIAGNOSIS — Z01818 Encounter for other preprocedural examination: Secondary | ICD-10-CM | POA: Insufficient documentation

## 2020-11-11 DIAGNOSIS — R7989 Other specified abnormal findings of blood chemistry: Secondary | ICD-10-CM | POA: Insufficient documentation

## 2020-11-11 DIAGNOSIS — I639 Cerebral infarction, unspecified: Secondary | ICD-10-CM

## 2020-11-11 DIAGNOSIS — R482 Apraxia: Secondary | ICD-10-CM

## 2020-11-11 LAB — CBC WITH DIFFERENTIAL/PLATELET
Abs Immature Granulocytes: 0.17 10*3/uL — ABNORMAL HIGH (ref 0.00–0.07)
Basophils Absolute: 0.1 10*3/uL (ref 0.0–0.1)
Basophils Relative: 1 %
Eosinophils Absolute: 0.4 10*3/uL (ref 0.0–0.5)
Eosinophils Relative: 2 %
HCT: 38.3 % (ref 36.0–46.0)
Hemoglobin: 12.5 g/dL (ref 12.0–15.0)
Immature Granulocytes: 1 %
Lymphocytes Relative: 21 %
Lymphs Abs: 4.2 10*3/uL — ABNORMAL HIGH (ref 0.7–4.0)
MCH: 30.6 pg (ref 26.0–34.0)
MCHC: 32.6 g/dL (ref 30.0–36.0)
MCV: 93.9 fL (ref 80.0–100.0)
Monocytes Absolute: 1.4 10*3/uL — ABNORMAL HIGH (ref 0.1–1.0)
Monocytes Relative: 7 %
Neutro Abs: 13.5 10*3/uL — ABNORMAL HIGH (ref 1.7–7.7)
Neutrophils Relative %: 68 %
Platelets: 377 10*3/uL (ref 150–400)
RBC: 4.08 MIL/uL (ref 3.87–5.11)
RDW: 12.1 % (ref 11.5–15.5)
WBC: 19.7 10*3/uL — ABNORMAL HIGH (ref 4.0–10.5)
nRBC: 0 % (ref 0.0–0.2)

## 2020-11-11 LAB — BASIC METABOLIC PANEL
Anion gap: 9 (ref 5–15)
BUN: 28 mg/dL — ABNORMAL HIGH (ref 8–23)
CO2: 26 mmol/L (ref 22–32)
Calcium: 9.1 mg/dL (ref 8.9–10.3)
Chloride: 102 mmol/L (ref 98–111)
Creatinine, Ser: 1.36 mg/dL — ABNORMAL HIGH (ref 0.44–1.00)
GFR, Estimated: 37 mL/min — ABNORMAL LOW (ref >60)
Glucose, Bld: 196 mg/dL — ABNORMAL HIGH (ref 70–99)
Potassium: 3.6 mmol/L (ref 3.5–5.1)
Sodium: 137 mmol/L (ref 135–145)

## 2020-11-11 LAB — TYPE AND SCREEN
ABO/RH(D): B POS
Antibody Screen: NEGATIVE

## 2020-11-11 LAB — SARS CORONAVIRUS 2 (TAT 6-24 HRS): SARS Coronavirus 2: NEGATIVE

## 2020-11-11 NOTE — Progress Notes (Signed)
  [facility] Perioperative Services: Pre-Admission/Anesthesia Testing  Abnormal Lab Notification   Date: 11/11/20  Name: Cassandra Schaefer MRN:   [id number]  Re: Abnormal labs noted during PAT appointment   Provider(s) Notified: Annice Needy, MD and Sheppard Plumber, NP-C Notification mode: Routed and/or faxed via [facility]   ABNORMAL LAB VALUE(S): Lab Results  Component Value Date   WBC 19.7 (H) 11/11/2020   Notes:  Patient is scheduled for a CAROTID ENDARTERECTOMY on 11/13/2020. Patient recently treated CEPHALEXIN course fir urinary tract infection; completed on 11/08/2020.   This is a Personal assistant; no formal response is required.  Quentin Mulling, MSN, APRN, FNP-C, CEN [facility]  Peri-operative Services Nurse Practitioner Phone: [phone] Fax: [phone] 11/11/20 12:28 PM

## 2020-11-12 ENCOUNTER — Encounter: Payer: Self-pay | Admitting: Speech Pathology

## 2020-11-12 ENCOUNTER — Ambulatory Visit: Payer: Medicare HMO | Admitting: Physical Therapy

## 2020-11-12 ENCOUNTER — Encounter: Payer: Self-pay | Admitting: Physical Therapy

## 2020-11-12 ENCOUNTER — Other Ambulatory Visit: Payer: Self-pay

## 2020-11-12 DIAGNOSIS — M6281 Muscle weakness (generalized): Secondary | ICD-10-CM

## 2020-11-12 DIAGNOSIS — R2689 Other abnormalities of gait and mobility: Secondary | ICD-10-CM

## 2020-11-12 DIAGNOSIS — R2681 Unsteadiness on feet: Secondary | ICD-10-CM

## 2020-11-12 DIAGNOSIS — R278 Other lack of coordination: Secondary | ICD-10-CM

## 2020-11-12 MED ORDER — VANCOMYCIN HCL IN DEXTROSE 1-5 GM/200ML-% IV SOLN
1000.0000 mg | INTRAVENOUS | Status: AC
  Administered 2020-11-13: 1000 mg via INTRAVENOUS

## 2020-11-12 MED ORDER — CHLORHEXIDINE GLUCONATE CLOTH 2 % EX PADS: 6.0000 | MEDICATED_PAD | Freq: Once | CUTANEOUS | Status: DC

## 2020-11-12 MED ORDER — SODIUM CHLORIDE 0.9 % IV SOLN: INTRAVENOUS | Status: DC

## 2020-11-12 MED ORDER — FAMOTIDINE 20 MG PO TABS
20.0000 mg | ORAL_TABLET | Freq: Once | ORAL | Status: AC
  Administered 2020-11-13: 20 mg via ORAL

## 2020-11-12 NOTE — Therapy (Signed)
[facility] MAIN [facility] SERVICES [address] [place], [place], [address] Phone: [phone]   Fax:  [phone]  Physical Therapy Evaluation  Patient Details  Name: Cassandra Schaefer MRN: [id number] Date of Birth: 03/27/1929 Referring Provider (PT): Dr. Marcello Fennel   Encounter Date: 11/12/2020   PT End of Session - 11/12/20 1327     Visit Number 1    Number of Visits 17    Date for PT Re-Evaluation 01/07/21    Authorization Type Medicare    Authorization Time Period 11/12/20- Start of care    PT Start Time 1016    PT Stop Time 1120    PT Time Calculation (min) 64 min    Equipment Utilized During Treatment Gait belt    Activity Tolerance Patient tolerated treatment well;Patient limited by fatigue    Behavior During Therapy WFL for tasks assessed/performed             Past Medical History:  Diagnosis Date   Anemia    Cough    RESOLVING, NO FEVER   Diabetes mellitus without complication (HCC)    Gout    Hypertension    Hypothyroidism    PUD (peptic ulcer disease)    Stroke [facility])    Wears dentures    full upper, partial lower    Past Surgical History:  Procedure Laterality Date   AMPUTATION TOE Left 12/14/2017   Procedure: AMPUTATION TOE-4TH MPJ;  Surgeon: Gwyneth Revels, DPM;  Location: [facility];  Service: Podiatry;  Laterality: Left;  IVA LOCAL Diabetic - oral meds   APPENDECTOMY     CATARACT EXTRACTION W/PHACO Right 06/23/2015   Procedure: CATARACT EXTRACTION PHACO AND INTRAOCULAR LENS PLACEMENT (IOC);  Surgeon: Sallee Lange, MD;  Location: [facility] ORS;  Service: Ophthalmology;  Laterality: Right;  [place] 01:28 AP% 23.4 CDE 36.14 fluid pack lot # [id number]   CATARACT EXTRACTION W/PHACO Left 07/28/2015   Procedure: CATARACT EXTRACTION PHACO AND INTRAOCULAR LENS PLACEMENT (IOC);  Surgeon: Sallee Lange, MD;  Location: [facility] ORS;  Service: Ophthalmology;  Laterality: Left;  [place]    1:29.7 AP%  24.9 CDE   41.32 fluid casette  lot #[id number] H exp 09/23/2016   COONOSCOPY     AND ENDOSCOPY   DILATION AND CURETTAGE OF UTERUS     TONSILLECTOMY     TUBAL LIGATION      There were no vitals filed for this visit.    Subjective Assessment - 11/12/20 1018     Subjective "I feel pretty good." Patient reports feeling weak on right side.    Patient is accompained by: Family member   Daughter, [name]   Pertinent History Pt is a 87 y.o. female with history of T2DM, HTN, CKD 3 who was admitted to [facility] on 10/19/2020 with difficulty walking due to right-sided weakness with sensory deficits as well as word finding deficits.  Pt was discharged to [name] where she stayed 10 days before returning home with family.  CVA d/t carotid stenosis.  Pt has bilat carotid endarectomy scheduled for Thurs. 11/13/20.  Pt lives with daughter in split level home.  Pt has large family, all very supportive, and family ensures pt has 24 hr supv.  Prior to CVA, pt was indep with all self care, driving, and was active at a local senior center 2 days a week for exercise classes. She reports prior to stroke she would rarely use the cane. Family provides CGA for all functional mobility with use of RW d/t impaired balance. She  reports that her family will use gait belt and she will use the walker for safety. Denies any recent falls. Denies any numbness/tingling; Denies any dizziness, but does feel wobbly/unsteady.    Limitations Standing;Walking    How long can you sit comfortably? NA    How long can you stand comfortably? >30 min    How long can you walk comfortably? about 100 feet with RW, requiring short rest break    Diagnostic tests MRI shows left basal ganglia infarct 10/21/20    Patient Stated Goals "Improve balance and walking and not need RW, get back to independent."    Currently in Pain? No/denies    Multiple Pain Sites No                [facility] PT Assessment - 11/12/20 0001       Assessment   Medical Diagnosis L basal ganglia embolic stroke     Referring Provider (PT) Dr. Marcello Fennel    Onset Date/Surgical Date 10/19/20    Hand Dominance Right    Next MD Visit Nothing scheduled    Prior Therapy [name], no outpatient PT for this condition;      Precautions   Precautions Fall    Required Braces or Orthoses --   none     Restrictions   Weight Bearing Restrictions No      Balance Screen   Has the patient fallen in the past 6 months No    Has the patient had a decrease in activity level because of a fear of falling?  No    Is the patient reluctant to leave their home because of a fear of falling?  No      Home Environment   Additional Comments Lives in split level home, has 6 steps to negotiate to get to bedroom/bathroom, negotiates steps favoring right leg, one step at a time, forward with handrails;      Prior Function   Level of Independence Independent    Vocation Retired    [employer] retired Pension scheme manager    Leisure play cards, dominoes, go to movies      Cognition   Overall Cognitive Status Within Functional Limits for tasks assessed      Observation/Other Assessments   Focus on Therapeutic Outcomes (FOTO)  50%      Sensation   Light Touch Appears Intact      Coordination   Gross Motor Movements are Fluid and Coordinated No    Fine Motor Movements are Fluid and Coordinated No    Heel Shin Test --   Slight dysmetric with RLE overshooting; LLE is accurate     Posture/Postural Control   Posture Comments WFL      ROM / Strength   AROM / PROM / Strength AROM;Strength      AROM   Overall AROM Comments BUE and BLE are [facility]      Strength   Right Hip Flexion 4-/5    Right Hip ABduction 4/5    Right Hip ADduction 4/5    Left Hip Flexion 4/5    Left Hip ABduction 4/5    Left Hip ADduction 4/5    Right Knee Flexion 4-/5    Right Knee Extension 4-/5    Left Knee Flexion 4/5    Left Knee Extension 4/5    Right Ankle Dorsiflexion 4-/5    Left Ankle Dorsiflexion 4/5      Transfers   Comments  requires HHA and or arm rests to  stand up. Required CGA for safety      Ambulation/Gait   Gait Comments Pt able to take a few steps with min A/HHA with increased posterior loss of balance, unsteadiness, narrow base of support, reciprocal pattern; When walking with RW, requires CGA, exhibits increased RLE foot drag with increased ambulation with slower gait speed      6 Minute Walk- Baseline   6 Minute Walk- Baseline yes    BP (mmHg) 158/74    HR (bpm) 63    02 Sat (%RA) 100 %      6 Minute walk- Post Test   6 Minute Walk Post Test yes    BP (mmHg) 165/61    Modified Borg Scale for Dyspnea 0- Nothing at all      6 minute walk test results    Aerobic Endurance Distance Walked 220    Endurance additional comments with RW, limited community ambulator      Standardized Balance Assessment   Standardized Balance Assessment Timed Up and Go Test;Five Times Sit to Stand;10 meter walk test    10 Meter Walk 0.49 m/s with RW, limited home ambulator, increased risk for falls                        Objective measurements completed on examination: See above findings.               PT Education - 11/12/20 1326     Education Details recommendations/plan of care    Person(s) Educated Patient    Methods Explanation    Comprehension Verbalized understanding              PT Short Term Goals - 11/12/20 1337       PT SHORT TERM GOAL #1   Title Patient will be adherent to HEP at least 3x a week to improve functional strength and balance for better safety at home.    Time 4    Period Weeks    Status New    Target Date 12/10/20      PT SHORT TERM GOAL #2   Title Patient will be independent in transferring sit<>Stand without pushing on arm rests to improve ability to get up from chair.    Time 4    Period Weeks    Status New    Target Date 12/10/20               PT Long Term Goals - 11/12/20 1338       PT LONG TERM GOAL #1   Title Patient (> 85 years  old) will complete five times sit to stand test in < 15 seconds indicating an increased LE strength and improved balance.    Time 8    Period Weeks    Status New    Target Date 01/07/21      PT LONG TERM GOAL #2   Title Patient will increase six minute walk test distance to >400 feet for improve gait ability    Time 8    Period Weeks    Status New    Target Date 01/07/21      PT LONG TERM GOAL #3   Title Patient will increase 10 meter walk test to >1.6283m/s as to improve gait speed for better community ambulation and to reduce fall risk.    Time 8    Period Weeks    Status New    Target Date 01/07/21  PT LONG TERM GOAL #4   Title Patient will be independent with ascend/descend 6 steps using single UE in step over step pattern without LOB.    Time 8    Period Weeks    Status New    Target Date 01/07/21      PT LONG TERM GOAL #5   Title Patient will demonstrate an improved Berg Balance Score of >45/56 as to demonstrate improved balance with ADLs such as sitting/standing and transfer balance and reduced fall risk.    Time 8    Period Weeks    Status New    Target Date 01/07/21      Additional Long Term Goals   Additional Long Term Goals Yes      PT LONG TERM GOAL #6   Title Patient will improve FOTO score to >60% to indicate improved functional mobility with ADLs.    Time 8    Period Weeks    Status New    Target Date 01/07/21                    Plan - 11/12/20 1327     Clinical Impression Statement 87 yo Female s/p left basal gangia CVA with right side weakness and imbalance. Patient was independent prior to CVA and was exercising at local senior center 2x a week. She lives with her daughter and has a supportive family. She does live in a split level home and has to negotiate steps to go to bedroom/bathroom. Patient is currently ambulating with RW requiring CGA for safety with ambulation. She is walking as a limited home ambulator with slower gait speed. She  exhibits impaired balance with increased posterior lean and unsteadiness when walking. She exhibits decreased coordination with RLE which contributes to imbalance. Patient also exhibits weakness in BLE (R>L) with increased foot drag with prolonged ambulation. She would benefit from skilled PT Intervention to address balance/strength deficits and improve walking ability. She would benefit from additional balance assessment for better understanding of fall risk.    Personal Factors and Comorbidities Age;Comorbidity 3+;Fitness    Comorbidities HTN, CKD, fall risk, type 2 diabetes,    Examination-Activity Limitations Lift;Locomotion Level;Squat;Stairs;Stand;Toileting;Transfers;Bathing    Examination-Participation Restrictions Cleaning;Community Activity;Laundry;Meal Prep;Shop;Volunteer;Driving    Stability/Clinical Decision Making Stable/Uncomplicated    Clinical Decision Making Low    PT Frequency 2x / week    PT Duration 8 weeks    PT Treatment/Interventions Cryotherapy;Electrical Stimulation;Moist Heat;Gait training;Stair training;Functional mobility training;Therapeutic activities;Therapeutic exercise;Neuromuscular re-education;Balance training;Patient/family education;Orthotic Fit/Training;Energy conservation    PT Next Visit Plan further assess balance with Berg Balance test, do 5 times sit<>Stand    PT Home Exercise Plan will address next session;    Recommended Other Services Pt evaluated and receiving outpatient OT and ST    Consulted and Agree with Plan of Care Patient             Patient will benefit from skilled therapeutic intervention in order to improve the following deficits and impairments:  Abnormal gait, Decreased balance, Decreased endurance, Decreased mobility, Difficulty walking, Cardiopulmonary status limiting activity, Decreased activity tolerance, Decreased coordination, Decreased strength  Visit Diagnosis: Unsteadiness on feet  Muscle weakness (generalized)  Other  lack of coordination  Other abnormalities of gait and mobility     Problem List Patient Active Problem List   Diagnosis Date Noted   Gout flare 11/10/2020   UTI (urinary tract infection) 11/10/2020   Left carotid artery stenosis 11/10/2020   Chronic kidney disease 11/10/2020  Left basal ganglia embolic stroke (HCC) 10/24/2020   PAC (premature atrial contraction)    Pre-procedural cardiovascular examination    Ischemic stroke (HCC) 10/20/2020   TIA (transient ischemic attack) 10/19/2020   Right hemiparesis (HCC) 10/19/2020   Type 2 diabetes mellitus with stage 3 chronic kidney disease, without long-term current use of insulin (HCC) 08/24/2018   Hyperlipidemia, unspecified 07/19/2017   Hypertension 07/19/2017   PUD (peptic ulcer disease) 07/19/2017   Type 2 diabetes mellitus (HCC) 07/19/2017   Personal history of gout 08/12/2016   Anemia, unspecified 04/09/2016   Hypothyroidism, acquired 12/10/2015    Anaid Haney PT, DPT 11/12/2020, 1:41 PM   [facility] MAIN [facility] SERVICES [address] [place], [place], [address] Phone: [phone]   Fax:  [phone]  Name: Cassandra Schaefer MRN: [id number] Date of Birth: 1928/09/24

## 2020-11-13 ENCOUNTER — Inpatient Hospital Stay
Admission: RE | Admit: 2020-11-13 | Discharge: 2020-11-17 | DRG: 038 | Disposition: A | Discharge status: Home / Self Care | Payer: Medicare HMO | Attending: Vascular Surgery | Admitting: Vascular Surgery

## 2020-11-13 ENCOUNTER — Inpatient Hospital Stay: Payer: Medicare HMO | Admitting: Anesthesiology

## 2020-11-13 ENCOUNTER — Other Ambulatory Visit: Payer: Self-pay

## 2020-11-13 ENCOUNTER — Encounter: Payer: Self-pay | Admitting: Vascular Surgery

## 2020-11-13 ENCOUNTER — Encounter
Admission: RE | Disposition: A | Discharge status: Home / Self Care | Payer: Self-pay | Source: Home / Self Care | Attending: Vascular Surgery

## 2020-11-13 ENCOUNTER — Inpatient Hospital Stay: Payer: Medicare HMO | Admitting: Urgent Care

## 2020-11-13 DIAGNOSIS — Z87891 Personal history of nicotine dependence: Secondary | ICD-10-CM

## 2020-11-13 DIAGNOSIS — Z20822 Contact with and (suspected) exposure to covid-19: Secondary | ICD-10-CM | POA: Diagnosis present

## 2020-11-13 DIAGNOSIS — I69328 Other speech and language deficits following cerebral infarction: Secondary | ICD-10-CM

## 2020-11-13 DIAGNOSIS — Z882 Allergy status to sulfonamides status: Secondary | ICD-10-CM

## 2020-11-13 DIAGNOSIS — N183 Chronic kidney disease, stage 3 unspecified: Secondary | ICD-10-CM | POA: Diagnosis present

## 2020-11-13 DIAGNOSIS — Z8711 Personal history of peptic ulcer disease: Secondary | ICD-10-CM | POA: Diagnosis not present

## 2020-11-13 DIAGNOSIS — Y838 Other surgical procedures as the cause of abnormal reaction of the patient, or of later complication, without mention of misadventure at the time of the procedure: Secondary | ICD-10-CM | POA: Diagnosis not present

## 2020-11-13 DIAGNOSIS — D631 Anemia in chronic kidney disease: Secondary | ICD-10-CM | POA: Diagnosis present

## 2020-11-13 DIAGNOSIS — I69351 Hemiplegia and hemiparesis following cerebral infarction affecting right dominant side: Secondary | ICD-10-CM

## 2020-11-13 DIAGNOSIS — L7632 Postprocedural hematoma of skin and subcutaneous tissue following other procedure: Secondary | ICD-10-CM | POA: Diagnosis not present

## 2020-11-13 DIAGNOSIS — E1122 Type 2 diabetes mellitus with diabetic chronic kidney disease: Secondary | ICD-10-CM | POA: Diagnosis present

## 2020-11-13 DIAGNOSIS — Z880 Allergy status to penicillin: Secondary | ICD-10-CM | POA: Diagnosis not present

## 2020-11-13 DIAGNOSIS — Z888 Allergy status to other drugs, medicaments and biological substances status: Secondary | ICD-10-CM

## 2020-11-13 DIAGNOSIS — I63232 Cerebral infarction due to unspecified occlusion or stenosis of left carotid arteries: Secondary | ICD-10-CM

## 2020-11-13 DIAGNOSIS — I129 Hypertensive chronic kidney disease with stage 1 through stage 4 chronic kidney disease, or unspecified chronic kidney disease: Secondary | ICD-10-CM | POA: Diagnosis present

## 2020-11-13 DIAGNOSIS — I63239 Cerebral infarction due to unspecified occlusion or stenosis of unspecified carotid arteries: Secondary | ICD-10-CM | POA: Diagnosis not present

## 2020-11-13 DIAGNOSIS — K219 Gastro-esophageal reflux disease without esophagitis: Secondary | ICD-10-CM | POA: Diagnosis present

## 2020-11-13 DIAGNOSIS — S1093XA Contusion of unspecified part of neck, initial encounter: Secondary | ICD-10-CM | POA: Diagnosis not present

## 2020-11-13 DIAGNOSIS — E039 Hypothyroidism, unspecified: Secondary | ICD-10-CM | POA: Diagnosis present

## 2020-11-13 DIAGNOSIS — Z89422 Acquired absence of other left toe(s): Secondary | ICD-10-CM

## 2020-11-13 DIAGNOSIS — M109 Gout, unspecified: Secondary | ICD-10-CM | POA: Diagnosis present

## 2020-11-13 DIAGNOSIS — I97638 Postprocedural hematoma of a circulatory system organ or structure following other circulatory system procedure: Secondary | ICD-10-CM

## 2020-11-13 DIAGNOSIS — I6522 Occlusion and stenosis of left carotid artery: Secondary | ICD-10-CM | POA: Diagnosis present

## 2020-11-13 DIAGNOSIS — Z978 Presence of other specified devices: Secondary | ICD-10-CM | POA: Diagnosis not present

## 2020-11-13 HISTORY — PX: ENDARTERECTOMY: SHX5162

## 2020-11-13 LAB — GLUCOSE, CAPILLARY
Glucose-Capillary: 138 mg/dL — ABNORMAL HIGH (ref 70–99)
Glucose-Capillary: 139 mg/dL — ABNORMAL HIGH (ref 70–99)

## 2020-11-13 LAB — ABO/RH: ABO/RH(D): B POS

## 2020-11-13 LAB — MRSA NEXT GEN BY PCR, NASAL: MRSA by PCR Next Gen: NOT DETECTED

## 2020-11-13 SURGERY — ENDARTERECTOMY, CAROTID
Anesthesia: General | Laterality: Left

## 2020-11-13 MED ORDER — 0.9 % SODIUM CHLORIDE (POUR BTL) OPTIME
TOPICAL | Status: DC | PRN
  Administered 2020-11-13: 200 mL

## 2020-11-13 MED ORDER — FENTANYL CITRATE (PF) 100 MCG/2ML IJ SOLN
INTRAMUSCULAR | Status: DC | PRN
  Administered 2020-11-13: 25 ug via INTRAVENOUS

## 2020-11-13 MED ORDER — LIDOCAINE HCL (PF) 2 % IJ SOLN
INTRAMUSCULAR | Status: AC
  Filled 2020-11-13: qty 5

## 2020-11-13 MED ORDER — METOPROLOL TARTRATE 5 MG/5ML IV SOLN
2.0000 mg | INTRAVENOUS | Status: DC | PRN
  Filled 2020-11-13: qty 5

## 2020-11-13 MED ORDER — MAGNESIUM SULFATE 2 GM/50ML IV SOLN
2.0000 g | Freq: Every day | INTRAVENOUS | Status: DC | PRN
  Filled 2020-11-13: qty 50

## 2020-11-13 MED ORDER — 0.9 % SODIUM CHLORIDE (POUR BTL) OPTIME
TOPICAL | Status: DC | PRN
  Administered 2020-11-13: 500 mL

## 2020-11-13 MED ORDER — LIDOCAINE HCL (CARDIAC) PF 100 MG/5ML IV SOSY
PREFILLED_SYRINGE | INTRAVENOUS | Status: DC | PRN
  Administered 2020-11-13: 60 mg via INTRAVENOUS

## 2020-11-13 MED ORDER — ATORVASTATIN CALCIUM 20 MG PO TABS
80.0000 mg | ORAL_TABLET | Freq: Every day | ORAL | Status: DC
  Administered 2020-11-14 – 2020-11-16 (×3): 80 mg via ORAL
  Filled 2020-11-13 (×4): qty 4

## 2020-11-13 MED ORDER — LABETALOL HCL 5 MG/ML IV SOLN
10.0000 mg | INTRAVENOUS | Status: AC | PRN
  Administered 2020-11-13 – 2020-11-17 (×4): 10 mg via INTRAVENOUS
  Filled 2020-11-13 (×4): qty 4

## 2020-11-13 MED ORDER — FENTANYL CITRATE (PF) 100 MCG/2ML IJ SOLN
INTRAMUSCULAR | Status: AC
  Filled 2020-11-13: qty 2

## 2020-11-13 MED ORDER — ROCURONIUM BROMIDE 100 MG/10ML IV SOLN
INTRAVENOUS | Status: DC | PRN
  Administered 2020-11-13: 30 mg via INTRAVENOUS
  Administered 2020-11-13: 20 mg via INTRAVENOUS

## 2020-11-13 MED ORDER — SODIUM CHLORIDE 0.9 % IV SOLN
INTRAVENOUS | Status: DC | PRN
  Administered 2020-11-13: 25 ug/min via INTRAVENOUS

## 2020-11-13 MED ORDER — PROPOFOL 1000 MG/100ML IV EMUL
INTRAVENOUS | Status: AC
  Administered 2020-11-14: 20 ug/kg/min via INTRAVENOUS
  Filled 2020-11-13: qty 100

## 2020-11-13 MED ORDER — ONDANSETRON HCL 4 MG/2ML IJ SOLN
INTRAMUSCULAR | Status: AC
  Filled 2020-11-13: qty 2

## 2020-11-13 MED ORDER — VANCOMYCIN HCL IN DEXTROSE 1-5 GM/200ML-% IV SOLN
1000.0000 mg | Freq: Once | INTRAVENOUS | Status: DC
  Administered 2020-11-14: 1000 mg via INTRAVENOUS
  Filled 2020-11-13: qty 200

## 2020-11-13 MED ORDER — FENTANYL CITRATE (PF) 100 MCG/2ML IJ SOLN
INTRAMUSCULAR | Status: AC
  Administered 2020-11-13: 25 ug via INTRAVENOUS
  Filled 2020-11-13: qty 2

## 2020-11-13 MED ORDER — HEPARIN SODIUM (PORCINE) 1000 UNIT/ML IJ SOLN
INTRAMUSCULAR | Status: DC | PRN
  Administered 2020-11-13: 5000 [IU] via INTRAVENOUS

## 2020-11-13 MED ORDER — CLOPIDOGREL BISULFATE 75 MG PO TABS
75.0000 mg | ORAL_TABLET | Freq: Every day | ORAL | Status: DC
  Administered 2020-11-14 – 2020-11-17 (×4): 75 mg via ORAL
  Filled 2020-11-13 (×4): qty 1

## 2020-11-13 MED ORDER — LIDOCAINE HCL 1 % IJ SOLN
INTRAMUSCULAR | Status: DC | PRN
  Administered 2020-11-13: 10 mL

## 2020-11-13 MED ORDER — ONDANSETRON HCL 4 MG/2ML IJ SOLN
INTRAMUSCULAR | Status: DC | PRN
  Administered 2020-11-13: 4 mg via INTRAVENOUS

## 2020-11-13 MED ORDER — HEMOSTATIC AGENTS (NO CHARGE) OPTIME
TOPICAL | Status: DC | PRN
  Administered 2020-11-13: 1 via TOPICAL

## 2020-11-13 MED ORDER — PROPOFOL 10 MG/ML IV BOLUS
INTRAVENOUS | Status: DC | PRN
  Administered 2020-11-13: 100 mg via INTRAVENOUS

## 2020-11-13 MED ORDER — VANCOMYCIN HCL IN DEXTROSE 1-5 GM/200ML-% IV SOLN: 1000.0000 mg | Freq: Two times a day (BID) | INTRAVENOUS | Status: DC

## 2020-11-13 MED ORDER — NITROGLYCERIN IN D5W 200-5 MCG/ML-% IV SOLN
INTRAVENOUS | Status: AC
  Filled 2020-11-13: qty 250

## 2020-11-13 MED ORDER — FAMOTIDINE IN NACL 20-0.9 MG/50ML-% IV SOLN
20.0000 mg | Freq: Every day | INTRAVENOUS | Status: DC
  Administered 2020-11-14: 20 mg via INTRAVENOUS
  Filled 2020-11-13: qty 50

## 2020-11-13 MED ORDER — CHLORHEXIDINE GLUCONATE 0.12 % MT SOLN
15.0000 mL | Freq: Once | OROMUCOSAL | Status: AC
  Administered 2020-11-13: 15 mL via OROMUCOSAL

## 2020-11-13 MED ORDER — SODIUM CHLORIDE 0.9 % IV SOLN: INTRAVENOUS | Status: DC | PRN

## 2020-11-13 MED ORDER — POTASSIUM CHLORIDE CRYS ER 20 MEQ PO TBCR: 20.0000 meq | EXTENDED_RELEASE_TABLET | Freq: Every day | ORAL | Status: DC | PRN

## 2020-11-13 MED ORDER — HYDRALAZINE HCL 20 MG/ML IJ SOLN
5.0000 mg | INTRAMUSCULAR | Status: AC | PRN
Start: 2020-11-13 — End: 2020-11-14
  Administered 2020-11-13 – 2020-11-14 (×2): 5 mg via INTRAVENOUS
  Filled 2020-11-13 (×2): qty 1

## 2020-11-13 MED ORDER — ORAL CARE MOUTH RINSE: 15.0000 mL | Freq: Once | OROMUCOSAL | Status: AC

## 2020-11-13 MED ORDER — SODIUM CHLORIDE (PF) 0.9 % IJ SOLN
INTRAMUSCULAR | Status: AC
  Filled 2020-11-13: qty 10

## 2020-11-13 MED ORDER — HEPARIN SODIUM (PORCINE) 5000 UNIT/ML IJ SOLN
INTRAMUSCULAR | Status: AC
  Filled 2020-11-13: qty 1

## 2020-11-13 MED ORDER — VANCOMYCIN HCL IN DEXTROSE 1-5 GM/200ML-% IV SOLN
INTRAVENOUS | Status: AC
  Filled 2020-11-13: qty 200

## 2020-11-13 MED ORDER — MORPHINE SULFATE (PF) 2 MG/ML IV SOLN: 2.0000 mg | INTRAVENOUS | Status: DC | PRN

## 2020-11-13 MED ORDER — CHLORHEXIDINE GLUCONATE 0.12 % MT SOLN
OROMUCOSAL | Status: AC
  Filled 2020-11-13: qty 15

## 2020-11-13 MED ORDER — PROPOFOL 10 MG/ML IV BOLUS
INTRAVENOUS | Status: DC | PRN
  Administered 2020-11-13: 80 mg via INTRAVENOUS

## 2020-11-13 MED ORDER — SUGAMMADEX SODIUM 200 MG/2ML IV SOLN
INTRAVENOUS | Status: DC | PRN
  Administered 2020-11-13: 120 mg via INTRAVENOUS

## 2020-11-13 MED ORDER — LABETALOL HCL 5 MG/ML IV SOLN
INTRAVENOUS | Status: DC | PRN
  Administered 2020-11-13 (×2): 5 mg via INTRAVENOUS

## 2020-11-13 MED ORDER — "VISTASEAL 4 ML SINGLE DOSE KIT "
PACK | CUTANEOUS | Status: DC | PRN
  Administered 2020-11-13: 4 mL via TOPICAL

## 2020-11-13 MED ORDER — TRAMADOL HCL 50 MG PO TABS: 25.0000 mg | ORAL_TABLET | Freq: Once | ORAL | Status: DC

## 2020-11-13 MED ORDER — LIDOCAINE HCL (PF) 1 % IJ SOLN
INTRAMUSCULAR | Status: AC
  Filled 2020-11-13: qty 30

## 2020-11-13 MED ORDER — PHENYLEPHRINE HCL (PRESSORS) 10 MG/ML IV SOLN
INTRAVENOUS | Status: DC | PRN
  Administered 2020-11-13: 50 ug via INTRAVENOUS

## 2020-11-13 MED ORDER — PROPOFOL 1000 MG/100ML IV EMUL: 0.0000 ug/kg/min | INTRAVENOUS | Status: DC

## 2020-11-13 MED ORDER — PROPOFOL 10 MG/ML IV BOLUS
INTRAVENOUS | Status: AC
  Filled 2020-11-13: qty 20

## 2020-11-13 MED ORDER — FENTANYL CITRATE (PF) 100 MCG/2ML IJ SOLN
25.0000 ug | INTRAMUSCULAR | Status: DC | PRN
  Administered 2020-11-14: 25 ug via INTRAVENOUS
  Filled 2020-11-13: qty 2

## 2020-11-13 MED ORDER — FENTANYL CITRATE (PF) 100 MCG/2ML IJ SOLN
INTRAMUSCULAR | Status: DC | PRN
  Administered 2020-11-13 (×2): 50 ug via INTRAVENOUS

## 2020-11-13 MED ORDER — OXYCODONE-ACETAMINOPHEN 5-325 MG PO TABS: 1.0000 | ORAL_TABLET | ORAL | Status: DC | PRN

## 2020-11-13 MED ORDER — CHLORHEXIDINE GLUCONATE CLOTH 2 % EX PADS
6.0000 | MEDICATED_PAD | Freq: Every day | CUTANEOUS | Status: DC
  Administered 2020-11-13 – 2020-11-17 (×4): 6 via TOPICAL

## 2020-11-13 MED ORDER — ONDANSETRON HCL 4 MG/2ML IJ SOLN: 4.0000 mg | Freq: Four times a day (QID) | INTRAMUSCULAR | Status: DC | PRN

## 2020-11-13 MED ORDER — ACETAMINOPHEN 325 MG PO TABS
325.0000 mg | ORAL_TABLET | ORAL | Status: DC | PRN
  Administered 2020-11-13 – 2020-11-17 (×2): 650 mg via ORAL
  Filled 2020-11-13: qty 2

## 2020-11-13 MED ORDER — DEXAMETHASONE SODIUM PHOSPHATE 10 MG/ML IJ SOLN
INTRAMUSCULAR | Status: DC | PRN
  Administered 2020-11-13: 6 mg via INTRAVENOUS

## 2020-11-13 MED ORDER — ALUM & MAG HYDROXIDE-SIMETH 200-200-20 MG/5ML PO SUSP: 15.0000 mL | ORAL | Status: DC | PRN

## 2020-11-13 MED ORDER — NITROGLYCERIN IN D5W 200-5 MCG/ML-% IV SOLN: 5.0000 ug/min | INTRAVENOUS | Status: DC

## 2020-11-13 MED ORDER — ACETAMINOPHEN 650 MG RE SUPP: 325.0000 mg | RECTAL | Status: DC | PRN

## 2020-11-13 MED ORDER — SODIUM CHLORIDE 0.9 % IV SOLN: INTRAVENOUS | Status: DC

## 2020-11-13 MED ORDER — ROCURONIUM BROMIDE 100 MG/10ML IV SOLN
INTRAVENOUS | Status: DC | PRN
  Administered 2020-11-13: 50 mg via INTRAVENOUS

## 2020-11-13 MED ORDER — SODIUM CHLORIDE 0.9 % IV SOLN
INTRAVENOUS | Status: DC | PRN
  Administered 2020-11-13: 100 mL via INTRAMUSCULAR

## 2020-11-13 MED ORDER — FAMOTIDINE 20 MG PO TABS
ORAL_TABLET | ORAL | Status: AC
  Filled 2020-11-13: qty 1

## 2020-11-13 MED ORDER — TRAMADOL HCL 50 MG PO TABS
25.0000 mg | ORAL_TABLET | Freq: Once | ORAL | Status: DC | PRN
Start: 2020-11-13 — End: 2020-11-13

## 2020-11-13 MED ORDER — SUCCINYLCHOLINE CHLORIDE 20 MG/ML IJ SOLN
INTRAMUSCULAR | Status: DC | PRN
  Administered 2020-11-13: 80 mg via INTRAVENOUS

## 2020-11-13 MED ORDER — ESMOLOL HCL-SODIUM CHLORIDE 2000 MG/100ML IV SOLN
25.0000 ug/kg/min | INTRAVENOUS | Status: DC
  Filled 2020-11-13: qty 100

## 2020-11-13 MED ORDER — GUAIFENESIN-DM 100-10 MG/5ML PO SYRP: 15.0000 mL | ORAL_SOLUTION | ORAL | Status: DC | PRN

## 2020-11-13 MED ORDER — ASPIRIN 81 MG PO CHEW
81.0000 mg | CHEWABLE_TABLET | Freq: Every day | ORAL | Status: DC
  Administered 2020-11-14 – 2020-11-17 (×4): 81 mg via ORAL
  Filled 2020-11-13 (×4): qty 1

## 2020-11-13 MED ORDER — PHENOL 1.4 % MT LIQD
1.0000 | OROMUCOSAL | Status: DC | PRN
  Filled 2020-11-13: qty 177

## 2020-11-13 MED ORDER — ROCURONIUM BROMIDE 10 MG/ML (PF) SYRINGE
PREFILLED_SYRINGE | INTRAVENOUS | Status: AC
  Filled 2020-11-13: qty 10

## 2020-11-13 MED ORDER — HEPARIN SODIUM (PORCINE) 1000 UNIT/ML IJ SOLN
INTRAMUSCULAR | Status: AC
  Filled 2020-11-13: qty 1

## 2020-11-13 MED ORDER — DOCUSATE SODIUM 100 MG PO CAPS
100.0000 mg | ORAL_CAPSULE | Freq: Every day | ORAL | Status: DC
  Administered 2020-11-14 – 2020-11-17 (×4): 100 mg via ORAL
  Filled 2020-11-13 (×4): qty 1

## 2020-11-13 MED ORDER — FIBRIN SEALANT 2 ML SINGLE DOSE KIT
PACK | CUTANEOUS | Status: DC | PRN
  Administered 2020-11-13: 4 mL via TOPICAL

## 2020-11-13 MED ORDER — FENTANYL CITRATE (PF) 100 MCG/2ML IJ SOLN
25.0000 ug | INTRAMUSCULAR | Status: DC | PRN
  Administered 2020-11-13: 25 ug via INTRAVENOUS

## 2020-11-13 MED ORDER — DEXAMETHASONE SODIUM PHOSPHATE 10 MG/ML IJ SOLN
INTRAMUSCULAR | Status: AC
  Filled 2020-11-13: qty 1

## 2020-11-13 MED ORDER — LEVOTHYROXINE SODIUM 88 MCG PO TABS
88.0000 ug | ORAL_TABLET | Freq: Every day | ORAL | Status: DC
  Administered 2020-11-14 – 2020-11-17 (×4): 88 ug via ORAL
  Filled 2020-11-13 (×4): qty 1

## 2020-11-13 MED ORDER — POLYETHYLENE GLYCOL 3350 17 G PO PACK
17.0000 g | PACK | Freq: Every day | ORAL | Status: DC
  Administered 2020-11-14: 17 g
  Filled 2020-11-13: qty 1

## 2020-11-13 MED ORDER — GLYCOPYRROLATE 0.2 MG/ML IJ SOLN
INTRAMUSCULAR | Status: DC | PRN
  Administered 2020-11-13: .2 mg via INTRAVENOUS

## 2020-11-13 MED ORDER — SODIUM CHLORIDE 0.9 % IV SOLN: 500.0000 mL | Freq: Once | INTRAVENOUS | Status: DC | PRN

## 2020-11-13 MED ORDER — VANCOMYCIN HCL 500 MG/100ML IV SOLN
500.0000 mg | Freq: Once | INTRAVENOUS | Status: AC
  Administered 2020-11-13: 500 mg via INTRAVENOUS
  Filled 2020-11-13: qty 100

## 2020-11-13 SURGICAL SUPPLY — 61 items
BAG DECANTER FOR FLEXI CONT (MISCELLANEOUS) ×2 IMPLANT
BLADE SURG 15 STRL LF DISP TIS (BLADE) ×1 IMPLANT
BLADE SURG 15 STRL SS (BLADE) ×1
BLADE SURG SZ11 CARB STEEL (BLADE) ×2 IMPLANT
BOOT SUTURE AID YELLOW STND (SUTURE) ×2 IMPLANT
BRUSH SCRUB EZ  4% CHG (MISCELLANEOUS) ×1
BRUSH SCRUB EZ 4% CHG (MISCELLANEOUS) ×1 IMPLANT
CHLORAPREP W/TINT 26 (MISCELLANEOUS) ×2 IMPLANT
DERMABOND ADVANCED (GAUZE/BANDAGES/DRESSINGS) ×1
DERMABOND ADVANCED .7 DNX12 (GAUZE/BANDAGES/DRESSINGS) ×1 IMPLANT
DRAPE 3/4 80X56 (DRAPES) ×2 IMPLANT
DRAPE INCISE IOBAN 66X45 STRL (DRAPES) ×2 IMPLANT
DRAPE LAPAROTOMY 77X122 PED (DRAPES) ×2 IMPLANT
ELECT CAUTERY BLADE 6.4 (BLADE) ×2 IMPLANT
ELECT REM PT RETURN 9FT ADLT (ELECTROSURGICAL) ×2
ELECTRODE REM PT RTRN 9FT ADLT (ELECTROSURGICAL) ×1 IMPLANT
GAUZE 4X4 16PLY ~~LOC~~+RFID DBL (SPONGE) ×2 IMPLANT
GLOVE SURG ENC MOIS LTX SZ7 (GLOVE) ×2 IMPLANT
GLOVE SURG SYN 7.0 (GLOVE) ×4 IMPLANT
GLOVE SURG UNDER LTX SZ7.5 (GLOVE) ×2 IMPLANT
GOWN STRL REUS W/ TWL LRG LVL3 (GOWN DISPOSABLE) ×2 IMPLANT
GOWN STRL REUS W/ TWL XL LVL3 (GOWN DISPOSABLE) ×3 IMPLANT
GOWN STRL REUS W/TWL LRG LVL3 (GOWN DISPOSABLE) ×2
GOWN STRL REUS W/TWL XL LVL3 (GOWN DISPOSABLE) ×3
HEMOSTAT SURGICEL 2X3 (HEMOSTASIS) ×2 IMPLANT
IV NS 250ML (IV SOLUTION) ×1
IV NS 250ML BAXH (IV SOLUTION) ×1 IMPLANT
KIT TURNOVER KIT A (KITS) ×2 IMPLANT
LABEL OR SOLS (LABEL) ×2 IMPLANT
LOOP RED MAXI  1X406MM (MISCELLANEOUS) ×2
LOOP VESSEL MAXI 1X406 RED (MISCELLANEOUS) ×2 IMPLANT
LOOP VESSEL MINI 0.8X406 BLUE (MISCELLANEOUS) ×1 IMPLANT
LOOPS BLUE MINI 0.8X406MM (MISCELLANEOUS) ×1
MANIFOLD NEPTUNE II (INSTRUMENTS) ×2 IMPLANT
NEEDLE FILTER BLUNT 18X 1/2SAF (NEEDLE) ×1
NEEDLE FILTER BLUNT 18X1 1/2 (NEEDLE) ×1 IMPLANT
NEEDLE HYPO 25X1 1.5 SAFETY (NEEDLE) ×2 IMPLANT
NS IRRIG 500ML POUR BTL (IV SOLUTION) ×2 IMPLANT
PACK BASIN MAJOR ARMC (MISCELLANEOUS) ×2 IMPLANT
PATCH CAROTID ECM VASC 1X10 (Prosthesis & Implant Heart) ×2 IMPLANT
PENCIL ELECTRO HAND CTR (MISCELLANEOUS) ×2 IMPLANT
SHUNT W TPORT 9FR PRUITT F3 (SHUNT) ×2 IMPLANT
SPONGE T-LAP 18X18 ~~LOC~~+RFID (SPONGE) ×2 IMPLANT
SUT MNCRL 4-0 (SUTURE) ×1
SUT MNCRL 4-0 27XMFL (SUTURE) ×1
SUT PROLENE 6 0 BV (SUTURE) ×10 IMPLANT
SUT PROLENE 7 0 BV 1 (SUTURE) ×6 IMPLANT
SUT SILK 2 0 (SUTURE) ×1
SUT SILK 2-0 18XBRD TIE 12 (SUTURE) ×1 IMPLANT
SUT SILK 3 0 (SUTURE) ×1
SUT SILK 3-0 18XBRD TIE 12 (SUTURE) ×1 IMPLANT
SUT SILK 4 0 (SUTURE) ×1
SUT SILK 4-0 18XBRD TIE 12 (SUTURE) ×1 IMPLANT
SUT VIC AB 3-0 SH 27 (SUTURE) ×2
SUT VIC AB 3-0 SH 27X BRD (SUTURE) ×2 IMPLANT
SUTURE MNCRL 4-0 27XMF (SUTURE) ×1 IMPLANT
SYR 10ML LL (SYRINGE) ×4 IMPLANT
SYR 20ML LL LF (SYRINGE) ×2 IMPLANT
SYR BULB IRRIG 60ML STRL (SYRINGE) ×2 IMPLANT
TRAY FOLEY MTR SLVR 16FR STAT (SET/KITS/TRAYS/PACK) ×2 IMPLANT
TUBING CONNECTING 10 (TUBING) IMPLANT

## 2020-11-13 SURGICAL SUPPLY — 73 items
ADH SKN CLS APL DERMABOND .7 (GAUZE/BANDAGES/DRESSINGS)
APL PRP STRL LF DISP 70% ISPRP (MISCELLANEOUS)
APPLIER CLIP 11 MED OPEN (CLIP)
APPLIER CLIP 9.375 SM OPEN (CLIP)
APR CLP MED 11 20 MLT OPN (CLIP)
APR CLP SM 9.3 20 MLT OPN (CLIP)
BAG DECANTER FOR FLEXI CONT (MISCELLANEOUS) ×2 IMPLANT
BLADE SURG 15 STRL LF DISP TIS (BLADE) ×1 IMPLANT
BLADE SURG 15 STRL SS (BLADE) ×2
BLADE SURG SZ11 CARB STEEL (BLADE) ×2 IMPLANT
BOOT SUTURE AID YELLOW STND (SUTURE) ×2 IMPLANT
BRUSH SCRUB EZ  4% CHG (MISCELLANEOUS)
BRUSH SCRUB EZ 4% CHG (MISCELLANEOUS) IMPLANT
CANISTER SUCT 1200ML W/VALVE (MISCELLANEOUS) IMPLANT
CHLORAPREP W/TINT 26 (MISCELLANEOUS) IMPLANT
CLIP APPLIE 11 MED OPEN (CLIP) IMPLANT
CLIP APPLIE 9.375 SM OPEN (CLIP) IMPLANT
DERMABOND ADVANCED (GAUZE/BANDAGES/DRESSINGS)
DERMABOND ADVANCED .7 DNX12 (GAUZE/BANDAGES/DRESSINGS) IMPLANT
DRAPE 3/4 80X56 (DRAPES) ×2 IMPLANT
DRAPE INCISE IOBAN 66X45 STRL (DRAPES) ×2 IMPLANT
DRAPE LAPAROTOMY 100X77 ABD (DRAPES) ×2 IMPLANT
DRESSING SURGICEL FIBRLLR 1X2 (HEMOSTASIS) IMPLANT
DRSG OPSITE POSTOP 4X8 (GAUZE/BANDAGES/DRESSINGS) ×2 IMPLANT
DRSG SURGICEL FIBRILLAR 1X2 (HEMOSTASIS)
DRSG TELFA 4X3 1S NADH ST (GAUZE/BANDAGES/DRESSINGS) ×2 IMPLANT
ELECT CAUTERY BLADE 6.4 (BLADE) ×2 IMPLANT
ELECT REM PT RETURN 9FT ADLT (ELECTROSURGICAL) ×2
ELECTRODE REM PT RTRN 9FT ADLT (ELECTROSURGICAL) ×1 IMPLANT
GAUZE 4X4 16PLY ~~LOC~~+RFID DBL (SPONGE) ×2 IMPLANT
GLOVE SURG ENC MOIS LTX SZ7 (GLOVE) ×6 IMPLANT
GLOVE SURG SYN 8.0 (GLOVE) ×2 IMPLANT
GLOVE SURG UNDER LTX SZ7.5 (GLOVE) ×6 IMPLANT
GOWN STRL REUS W/ TWL LRG LVL3 (GOWN DISPOSABLE) ×2 IMPLANT
GOWN STRL REUS W/ TWL XL LVL3 (GOWN DISPOSABLE) ×1 IMPLANT
GOWN STRL REUS W/TWL LRG LVL3 (GOWN DISPOSABLE) ×4
GOWN STRL REUS W/TWL XL LVL3 (GOWN DISPOSABLE) ×2
HOLDER FOLEY CATH W/STRAP (MISCELLANEOUS) IMPLANT
IV NS 500ML (IV SOLUTION) ×2
IV NS 500ML BAXH (IV SOLUTION) ×1 IMPLANT
KIT TURNOVER KIT A (KITS) ×2 IMPLANT
LABEL OR SOLS (LABEL) ×2 IMPLANT
LOOP RED MAXI  1X406MM (MISCELLANEOUS) ×1
LOOP VESSEL MAXI 1X406 RED (MISCELLANEOUS) ×1 IMPLANT
LOOP VESSEL MINI 0.8X406 BLUE (MISCELLANEOUS) ×1 IMPLANT
LOOPS BLUE MINI 0.8X406MM (MISCELLANEOUS) ×1
MANIFOLD NEPTUNE II (INSTRUMENTS) ×2 IMPLANT
NEEDLE FILTER BLUNT 18X 1/2SAF (NEEDLE) ×1
NEEDLE FILTER BLUNT 18X1 1/2 (NEEDLE) ×1 IMPLANT
NEEDLE HYPO 25X1 1.5 SAFETY (NEEDLE) ×2 IMPLANT
NS IRRIG 500ML POUR BTL (IV SOLUTION) ×2 IMPLANT
PACK BASIN MAJOR ARMC (MISCELLANEOUS) ×2 IMPLANT
PENCIL ELECTRO HAND CTR (MISCELLANEOUS) IMPLANT
SHUNT CAROTID STR REINF 3.0X4. (MISCELLANEOUS) IMPLANT
SPONGE T-LAP 18X18 ~~LOC~~+RFID (SPONGE) ×4 IMPLANT
SUT MNCRL+ 5-0 UNDYED PC-3 (SUTURE) IMPLANT
SUT MONOCRYL 5-0 (SUTURE)
SUT PROLENE 6 0 BV (SUTURE) IMPLANT
SUT PROLENE 7 0 BV 1 (SUTURE) IMPLANT
SUT SILK 2 0 (SUTURE) ×2
SUT SILK 2-0 18XBRD TIE 12 (SUTURE) ×1 IMPLANT
SUT SILK 3 0 (SUTURE)
SUT SILK 3-0 18XBRD TIE 12 (SUTURE) IMPLANT
SUT SILK 4 0 (SUTURE) ×2
SUT SILK 4-0 18XBRD TIE 12 (SUTURE) ×1 IMPLANT
SUT VIC AB 3-0 SH 27 (SUTURE) ×6
SUT VIC AB 3-0 SH 27X BRD (SUTURE) ×3 IMPLANT
SYR 10ML LL (SYRINGE) ×2 IMPLANT
SYR 20ML LL LF (SYRINGE) ×2 IMPLANT
SYR 3ML LL SCALE MARK (SYRINGE) ×2 IMPLANT
SYR BULB EAR ULCER 3OZ GRN STR (SYRINGE) ×2 IMPLANT
TRAY FOLEY MTR SLVR 16FR STAT (SET/KITS/TRAYS/PACK) IMPLANT
TUBING CONNECTING 10 (TUBING) IMPLANT

## 2020-11-13 NOTE — Anesthesia Procedure Notes (Signed)
Arterial Line Insertion Start/End7/21/2022 1:30 PM, 11/13/2020 1:36 PM Performed by: Foye Deer, MD, Omer Jack, CRNA, anesthesiologist  Patient location: OR. Preanesthetic checklist: patient identified, IV checked, site marked, risks and benefits discussed, surgical consent, monitors and equipment checked, pre-op evaluation, timeout performed and anesthesia consent Patient sedated Left, radial was placed Catheter size: 20 Fr Hand hygiene performed  and maximum sterile barriers used   Attempts: 1 Procedure performed without using ultrasound guided technique. Following insertion, dressing applied. Post procedure assessment: normal and unchanged  Patient tolerated the procedure well with no immediate complications. Additional procedure comments: Performed in conjunction with induction of general anesthesia.

## 2020-11-13 NOTE — Therapy (Signed)
[facility] MAIN [facility] SERVICES [address] [place], [place], [address] Phone: [phone]   Fax:  [phone]  Speech Language Pathology Evaluation  Patient Details  Name: Cassandra Schaefer MRN: [id number] Date of Birth: 1929-01-18 Referring Provider (SLP): Barbette Reichmann   Encounter Date: 11/11/2020   End of Session - 11/13/20 1642     Visit Number 1    Number of Visits 25    Date for SLP Re-Evaluation 02/05/21    Authorization Type [employer] Medicare    Authorization Time Period 11/11/2020 thru 02/05/2021    Authorization - Visit Number 1    Progress Note Due on Visit 10    SLP Start Time 1500    SLP Stop Time  1600    SLP Time Calculation (min) 60 min    Activity Tolerance Patient tolerated treatment well             Past Medical History:  Diagnosis Date   Anemia    Cough    RESOLVING, NO FEVER   Diabetes mellitus without complication (HCC)    Gout    Hypertension    Hypothyroidism    PUD (peptic ulcer disease)    Stroke [facility])    Wears dentures    full upper, partial lower    Past Surgical History:  Procedure Laterality Date   AMPUTATION TOE Left 12/14/2017   Procedure: AMPUTATION TOE-4TH MPJ;  Surgeon: Gwyneth Revels, DPM;  Location: [facility];  Service: Podiatry;  Laterality: Left;  IVA LOCAL Diabetic - oral meds   APPENDECTOMY     CATARACT EXTRACTION W/PHACO Right 06/23/2015   Procedure: CATARACT EXTRACTION PHACO AND INTRAOCULAR LENS PLACEMENT (IOC);  Surgeon: Sallee Lange, MD;  Location: [facility] ORS;  Service: Ophthalmology;  Laterality: Right;  [place] 01:28 AP% 23.4 CDE 36.14 fluid pack lot # [id number]   CATARACT EXTRACTION W/PHACO Left 07/28/2015   Procedure: CATARACT EXTRACTION PHACO AND INTRAOCULAR LENS PLACEMENT (IOC);  Surgeon: Sallee Lange, MD;  Location: [facility] OR[facility];  Service: Ophthalmology;  Laterality: Left;  [place]    1:29.7 AP%  24.9 CDE   41.32 fluid casette lot #[id number] H exp 09/23/2016    COONOSCOPY     AND ENDOSCOPY   DILATION AND CURETTAGE OF UTERUS     TONSILLECTOMY     TUBAL LIGATION      There were no vitals filed for this visit.   Subjective Assessment - 11/13/20 1628     Subjective Pt pleasant, known to this writer, expresses that she was very pleased with [name]    Currently in Pain? No/denies                SLP Evaluation [facility] - 11/13/20 1628       SLP Visit Information   SLP Received On 11/13/20    Referring Provider (SLP) Barbette Reichmann    Onset Date 10/19/2020    Medical Diagnosis CVA      General Information   HPI 87 y.o. female with medical history significant for type 2 diabetes mellitus, essential hypertension, acquired hypothyroidism, hyperlipidemia, stage III chronic kidney disease with baseline creatinine 1.4-1.7, who is admitted to [facility] on 10/19/2020 with suspected TIA versus acute ischemic CVA after presenting from home to [facility] ED complaining of right-sided weakness. Confirmed L basal ganglia ischemic stroke. 10/21/2020 MRI confirms extension in CVA from 62mm to 15mm    Behavioral/Cognition appropriate    Mobility Status arrives in [employer] Chair      Prior  Functional Status   Cognitive/Linguistic Baseline Within functional limits    Type of Home House     Lives With Daughter    Available Support Family      Pain Assessment   Pain Assessment No/denies pain      Cognition   Overall Cognitive Status Within Functional Limits for tasks assessed      Auditory Comprehension   Overall Auditory Comprehension Appears within functional limits for tasks assessed      Visual Recognition/Discrimination   Discrimination Within Function Limits      Reading Comprehension   Reading Status Within funtional limits      Expression   Primary Mode of Expression Verbal      Verbal Expression   Overall Verbal Expression Impaired    Initiation No impairment    Automatic Speech Social Response    Level of  Generative/Spontaneous Verbalization Conversation    Repetition No impairment    Naming Impairment    Responsive 76-100% accurate    Confrontation 75-100% accurate    Convergent 75-100% accurate    Divergent 75-100% accurate    Other Naming Comments rare instances of word finding difficulty in which patient is able to repair with mod I-to-sup A using description strategy, gestures, additional time    Pragmatics No impairment    Non-Verbal Means of Communication Not applicable      Written Expression   Dominant Hand Right    Written Expression Not tested      Oral Motor/Sensory Function   Overall Oral Motor/Sensory Function Impaired      Motor Speech   Overall Motor Speech Impaired    Respiration Within functional limits    Level of Impairment Conversation    Phonation Hoarse    Resonance Within functional limits    Articulation Impaired    Level of Impairment Conversation    Intelligibility Intelligibility reduced    Word 75-100% accurate    Phrase 75-100% accurate    Sentence 75-100% accurate    Conversation 75-100% accurate    Motor Planning Witnin functional limits    Motor Speech Errors Aware                             SLP Education - 11/13/20 1642     Education Details results of assessment, ST POC    Person(s) Educated Patient    Methods Explanation    Comprehension Verbalized understanding;Returned demonstration              SLP Short Term Goals - 11/13/20 1647       SLP SHORT TERM GOAL #1   Title Pt will list 10 items within semi-complex category over 3 consecutive sessions.    Baseline new goal    Time 10    Period --   sessions   Status New      SLP SHORT TERM GOAL #2   Title With supervision cues, pt will utilize speech intelligibility strategies at the connected sentence level to achieve > 95% speech intelligibility.    Baseline new goal    Time 10    Period --   sessions             SLP Long Term Goals - 11/13/20  1651       SLP LONG TERM GOAL #1   Title Pt will utilize word finding strategies to effectively communicate abstract/coomplex ideas/information.    Baseline new goal    Time  12    Period Weeks    Status New    Target Date 02/05/21      SLP LONG TERM GOAL #2   Title Pt will speech intelligibility strategies to achieve > 95% speech intelligibility during complex conversation.    Baseline new goal    Time 12    Period Weeks    Status New    Target Date 02/05/21              Plan - 11/13/20 1644     Clinical Impression Statement Pt presents with mild expressive aphasia that is c/b higher level word finding deficits when converying complex/abstract ideas/information. She also demonstrates mild dysarthria that is c/b fast rate of speech, decreased respiratory support and imprecise articulation. This results in decreased speech intelligibility at the simple conversation level of ~ 75%. Prior to CVA, pt was active within her church, community and was independent with her communication abilities. Skilled ST intervention is required to target the above mentioned deficits in order to increase pt's functional independence, participation in community and leisure activities as well as reduce caregiver burden.    Speech Therapy Frequency 2x / week    Duration 12 weeks    Treatment/Interventions Language facilitation;Functional tasks;SLP instruction and feedback;Patient/family education    Potential to Achieve Goals Good    Consulted and Agree with Plan of Care Patient             Patient will benefit from skilled therapeutic intervention in order to improve the following deficits and impairments:   Aphasia  Dysarthria and anarthria  Apraxia  Left basal ganglia embolic stroke [facility])    Problem List Patient Active Problem List   Diagnosis Date Noted   Carotid stenosis, symptomatic, with infarction (HCC) 11/13/2020   Gout flare 11/10/2020   UTI (urinary tract infection)  11/10/2020   Left carotid artery stenosis 11/10/2020   Chronic kidney disease 11/10/2020   Left basal ganglia embolic stroke (HCC) 10/24/2020   PAC (premature atrial contraction)    Pre-procedural cardiovascular examination    Ischemic stroke (HCC) 10/20/2020   TIA (transient ischemic attack) 10/19/2020   Right hemiparesis (HCC) 10/19/2020   Type 2 diabetes mellitus with stage 3 chronic kidney disease, without long-term current use of insulin (HCC) 08/24/2018   Hyperlipidemia, unspecified 07/19/2017   Hypertension 07/19/2017   PUD (peptic ulcer disease) 07/19/2017   Type 2 diabetes mellitus (HCC) 07/19/2017   Personal history of gout 08/12/2016   Anemia, unspecified 04/09/2016   Hypothyroidism, acquired 12/10/2015   Yaneth Fairbairn B. Dreama Saa M.S., CCC-SLP, [facility] Speech-Language Pathologist Rehabilitation Services Office [phone]  [name] 11/13/2020, 4:55 PM  [facility] MAIN [facility] SERVICES [address] [place], [place], [address] Phone: [phone]   Fax:  [phone]  Name: Cassandra Schaefer MRN: [id number] Date of Birth: 05-Dec-1928

## 2020-11-13 NOTE — Anesthesia Preprocedure Evaluation (Addendum)
Anesthesia Evaluation  Patient identified by MRN, date of birth, ID band Patient awake    Reviewed: Allergy & Precautions, H&P , NPO status , Patient's Chart, lab work & pertinent test results  Airway Mallampati: II  TM Distance: >3 FB Neck ROM: full    Dental no notable dental hx. 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 ), Residual Symptoms    GI/Hepatic negative GI ROS,   Endo/Other  diabetes, Type 2Hypothyroidism   Renal/GU CRFRenal disease     Musculoskeletal   Abdominal Normal abdominal exam  (+)   Peds  Hematology   Anesthesia Other Findings Carotid ultrasound 09/2019: IMPRESSION: Color duplex indicates moderate heterogeneous and calcified plaque, with no hemodynamically significant stenosis by duplex criteria in the extracranial cerebrovascular circulation.  Reproductive/Obstetrics                           Anesthesia Physical  Anesthesia Plan  ASA: 4  Anesthesia Plan: General   Post-op Pain Management:    Induction: Intravenous  PONV Risk Score and Plan: 3 and Treatment may vary due to age or medical condition, Propofol infusion and Midazolam  Airway Management Planned: Oral ETT  Additional Equipment:   Intra-op Plan:   Post-operative Plan: Extubation in OR  Informed Consent: I have reviewed the patients History and Physical, chart, labs and discussed the procedure including the risks, benefits and alternatives for the proposed anesthesia with the patient or authorized representative who has indicated his/her understanding and acceptance.     Dental  Advisory Given  Plan Discussed with: CRNA, Anesthesiologist and Surgeon  Anesthesia Plan Comments: (Patient consented for risks of anesthesia including but not limited to:  - adverse reactions to medications - damage to eyes, teeth, lips or other oral mucosa - nerve damage due to positioning  - sore throat or hoarseness - Damage to heart, brain, nerves, lungs, other parts of body or loss of life  Patient voiced understanding.)       Anesthesia Quick Evaluation

## 2020-11-13 NOTE — Anesthesia Preprocedure Evaluation (Addendum)
Anesthesia Evaluation  Patient identified by MRN, date of birth, ID band Patient awake    Reviewed: Allergy & Precautions, H&P , NPO status , Patient's Chart, lab work & pertinent test results  History of Anesthesia Complications Negative for: history of anesthetic complications  Airway Mallampati: II  TM Distance: <3 FB Neck ROM: limited    Dental no notable dental hx. 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 ), Residual Symptoms negative neurological ROS  negative psych ROS   GI/Hepatic negative GI ROS, Neg liver ROS, PUD, GERD  Medicated and Controlled,  Endo/Other  negative endocrine ROSdiabetes, Type 2Hypothyroidism   Renal/GU CRFRenal disease     Musculoskeletal   Abdominal Normal abdominal exam  (+)   Peds  Hematology negative hematology ROS (+)   Anesthesia Other Findings Past Medical History: No date: Anemia No date: Cough     Comment:  RESOLVING, NO FEVER No date: Diabetes mellitus without complication (HCC) No date: Gout No date: Hypertension No date: Hypothyroidism No date: PUD (peptic ulcer disease) No date: Stroke [facility]) No date: Wears dentures     Comment:  full upper, partial lower   Carotid ultrasound 09/2019: IMPRESSION: Color duplex indicates moderate heterogeneous and calcified plaque, with no hemodynamically significant stenosis by duplex criteria in the extracranial cerebrovascular circulation.  Reproductive/Obstetrics negative OB ROS                             Anesthesia Physical  Anesthesia Plan  ASA: 4 and emergent  Anesthesia Plan: General ETT   Post-op Pain Management:     Induction: Intravenous  PONV Risk Score and Plan: 3 and Treatment may vary due to age or medical condition, Propofol infusion and Midazolam  Airway Management Planned: Oral ETT and Video Laryngoscope Planned  Additional Equipment:   Intra-op Plan:   Post-operative Plan: Post-operative intubation/ventilation  Informed Consent: I have reviewed the patients History and Physical, chart, labs and discussed the procedure including the risks, benefits and alternatives for the proposed anesthesia with the patient or authorized representative who has indicated his/her understanding and acceptance.     Dental Advisory Given  Plan Discussed with: CRNA, Anesthesiologist and Surgeon  Anesthesia Plan Comments: (Dr. Gilda Crease requests that we keep the patient intubated post op  Patient and daughter consented for risks of anesthesia including but not limited to:  - adverse reactions to medications - damage to eyes, teeth, lips or other oral mucosa - nerve damage due to positioning  - sore throat or hoarseness - Damage to heart, brain, nerves, lungs, other parts of body or loss of life  They voiced understanding.)      Anesthesia Quick Evaluation

## 2020-11-13 NOTE — Transfer of Care (Signed)
Immediate Anesthesia Transfer of Care Note  Patient: Cassandra Schaefer  Procedure(s) Performed: ENDARTERECTOMY CAROTID (Left)  Patient Location: PACU  Anesthesia Type:General  Level of Consciousness: drowsy and patient cooperative  Airway & Oxygen Therapy: Patient Spontanous Breathing and Patient connected to face mask oxygen  Post-op Assessment: Report given to RN and Post -op Vital signs reviewed and stable  Post vital signs: Reviewed and stable  Last Vitals:  Vitals Value Taken Time  BP 136/51 11/13/20 1556  Temp    Pulse 56 11/13/20 1557  Resp 12 11/13/20 1557  SpO2 100 % 11/13/20 1557  Vitals shown include unvalidated device data.  Last Pain:  Vitals:   11/13/20 1200  TempSrc: Oral  PainSc: 0-No pain         Complications: No notable events documented.

## 2020-11-13 NOTE — Op Note (Signed)
    OPERATIVE NOTE   PROCEDURE: 1.  Reexploration left neck status post carotid endarterectomy with evacuation of hematoma postop  PRE-OPERATIVE DIAGNOSIS: Postoperative left neck hematoma  POST-OPERATIVE DIAGNOSIS: Same  SURGEON: Levora Dredge  ASSISTANT(S): Ms. Raul Del  ANESTHESIA: general  ESTIMATED BLOOD LOSS: 100 cc  FINDING(S): Hematoma left neck incision  SPECIMEN(S): Thrombus to pathology permanent section  INDICATIONS:   Cassandra Schaefer is a 87 y.o. female who presents with an expanding hematoma of the left neck status post left carotid endarterectomy earlier today.  Given this finding I have recommended we return to the operating room to evacuate the hematoma and treat the source of bleeding.  Risks and benefits have been reviewed all questions were answered patient agrees to proceed I also spoke with her daughter who is also in agreement.  DESCRIPTION: After full informed written consent was obtained from the patient, the patient was brought back to the operating room and placed supine upon the operating table.  Prior to induction, the patient received IV antibiotics.   After obtaining adequate anesthesia, the patient was then prepped and draped in the standard fashion for a reexploration of the left neck.  The previous left neck incision is reopened with a 15 blade scalpel.  I then reopened the platysma layer.  Upon entering the neurovascular bundle I encountered a large amount of hematoma.  This was then removed digitally as well as irrigated.  Once the majority of the clotted blood had been evacuated I was able to identify a bleeding vein in the lateral side of the incision.  This was ligated with a 3-0 Vicryl.  In the apex of the incision there was also a bleeding spot located this appeared to be associated with a lymph node and this was treated with a figure-of-eight 3-0 Vicryl.  The wound was then irrigated with 500 cc of normal saline.  The suture line of  the patch was inspected and this was completely intact.  Fibular and Vistaseal were then placed in the bed of the wound.  2 figure-of-eight interrupted sutures were then used to reapproximate the deeper tissues the platysma was then reapproximated with a running 3-0 Vicryl and the skin was then closed with interrupted vertical mattress 5-0 nylon sutures.  Honeycomb dressing was applied.   The patient tolerated this procedure well she was hemodynamically stable throughout the entire case.   COMPLICATIONS: None  CONDITION: Cassandra Schaefer  [facility]  Office: [phone]   11/13/2020, 10:20 PM

## 2020-11-13 NOTE — Progress Notes (Signed)
[facility]  Daily Progress Note   Subjective  - Day of Surgery  Called to see patient for increasing swelling of the left neck status post left carotid endarterectomy.  Patient is noting a steady increase in pain in the left side of the neck.  At this point she is not noting any difficulty breathing.  Objective Vitals:   11/13/20 1900 11/13/20 1955 11/13/20 2000 11/13/20 2004  BP: (!) 122/45 (!) 171/58 (!) 149/50   Pulse: (!) 52 (!) 58 71 80  Resp: 11 18 17 15   Temp:   97.9 F (36.6 C)   TempSrc:   Oral   SpO2: 96% 97% 99% 99%  Weight:      Height:        Intake/Output Summary (Last 24 hours) at 11/13/2020 2039 Last data filed at 11/13/2020 1555 Gross per 24 hour  Intake 600 ml  Output 335 ml  Net 265 ml    PULM  Normal effort , no use of accessory muscles no stridor CV  No JVD, RRR Abd      No distended, nontender VASC  large hematoma which is quite hard left neck.  Trachea remains midline at this time  Laboratory CBC    Component Value Date/Time   WBC 19.7 (H) 11/11/2020 1109   HGB 12.5 11/11/2020 1109   HCT 38.3 11/11/2020 1109   PLT 377 11/11/2020 1109    BMET    Component Value Date/Time   NA 137 11/11/2020 1109   K 3.6 11/11/2020 1109   CL 102 11/11/2020 1109   CO2 26 11/11/2020 1109   GLUCOSE 196 (H) 11/11/2020 1109   BUN 28 (H) 11/11/2020 1109   CREATININE 1.36 (H) 11/11/2020 1109   CALCIUM 9.1 11/11/2020 1109   GFRNONAA 37 (L) 11/11/2020 1109    Assessment/Planning: POD #night of surgery s/p left carotid endarterectomy  We will return to the operating room for evacuation of hematoma to fix the source of bleeding, prevent respiratory compromise and allow for better healing.  Risks and benefits have been reviewed with the patient I have also called her daughter and discussed this by phone her daughter is on the way to the hospital.  I have notified the operating room and we will move forward once OR has been set  up.   11/13/2020  11/13/2020, 8:39 PM

## 2020-11-13 NOTE — Anesthesia Procedure Notes (Signed)
Procedure Name: Intubation Date/Time: 11/13/2020 1:35 PM Performed by: Jonna Clark, CRNA Pre-anesthesia Checklist: Patient identified, Patient being monitored, Timeout performed, Emergency Drugs available and Suction available Patient Re-evaluated:Patient Re-evaluated prior to induction Oxygen Delivery Method: Circle system utilized Preoxygenation: Pre-oxygenation with 100% oxygen Induction Type: IV induction Ventilation: Mask ventilation without difficulty Laryngoscope Size: Mac and 3 Grade View: Grade I Tube type: Oral Tube size: 6.5 mm Number of attempts: 1 Placement Confirmation: ETT inserted through vocal cords under direct vision, positive ETCO2 and breath sounds checked- equal and bilateral Secured at: 20 cm Tube secured with: Tape Dental Injury: Teeth and Oropharynx as per pre-operative assessment

## 2020-11-13 NOTE — Op Note (Signed)
[facility]   OPERATIVE NOTE  PROCEDURE:   1.  Left carotid endarterectomy with CorMatrix arterial patch reconstruction  PRE-OPERATIVE DIAGNOSIS: 1.  High grade left carotid stenosis 2. Left hemispheric stroke  POST-OPERATIVE DIAGNOSIS: same as above   SURGEON: Festus Barren, MD  ASSISTANT(S): Raul Del, PA-C  ANESTHESIA: general  ESTIMATED BLOOD LOSS: 25 cc  FINDING(S): 1.  Left carotid plaque.  SPECIMEN(S):  Carotid plaque (sent to Pathology)  INDICATIONS:   Cassandra Schaefer is a 87 y.o. female who presents with recent stroke and a left carotid stenosis of >80%.  I discussed with the patient the risks, benefits, and alternatives to carotid endarterectomy.  I discussed the differences between carotid stenting and carotid endarterectomy. I discussed the procedural details of carotid endarterectomy with the patient.  The patient is aware that the risks of carotid endarterectomy include but are not limited to: bleeding, infection, stroke, myocardial infarction, death, cranial nerve injuries both temporary and permanent, neck hematoma, possible airway compromise, labile blood pressure post-operatively, cerebral hyperperfusion syndrome, and possible need for additional interventions in the future. The patient is aware of the risks and agrees to proceed forward with the procedure. An assistant was present during the procedure to help facilitate the exposure and expedite the procedure.   DESCRIPTION: After full informed written consent was obtained from the patient, the patient was brought back to the operating room and placed supine upon the operating table.  Prior to induction, the patient received IV antibiotics.  After obtaining adequate anesthesia, the patient was placed into a modified beach chair position with a shoulder roll in place and the patient's neck slightly hyperextended and rotated away from the surgical site. The assistant provided retraction and  mobilization to help facilitate exposure and expedite the procedure throughout the entire procedure.  This included following suture, using retractors, and optimizing lighting.  The patient was prepped in the standard fashion for a carotid endarterectomy.  I made an incision anterior to the sternocleidomastoid muscle and dissected down through the subcutaneous tissue.  The platysmas was opened with electrocautery.  Then I dissected down to the internal jugular vein and facial vein.  The facial vein is ligated and divided between 2-0 silk ties.  This was dissected posteriorly until I obtained visualization of the common carotid artery.  This was dissected out and then a vessel loop was placed around the common carotid artery.  I then dissected in a periadventitial fashion along the common carotid artery up to the bifurcation.  I then identified the external carotid artery and the superior thyroid artery.  I placed a vessel loop around the superior thyroid artery, and I also dissected out the external carotid artery and placed a vessel loop around it. In the process of this dissection, the hypoglossal nerve was identified and protected from harm.  I then dissected out the internal carotid artery until I identified an area in the internal carotid artery clearly above the stenosis.  I dissected slightly distal to this area, and placed a vessel loop around the artery.  At this point, we gave the patient 5000 units of intravenous heparin.  After this was allowed to circulate for several minutes, I pulled up control on the vessel loops to clamp the internal carotid artery, external carotid artery, superior thyroid artery, and then the common carotid artery.  I then made an arteriotomy in the common carotid artery with a 11 blade, and extended the arteriotomy with a Potts scissor down into the  common carotid artery, then I carried the arteriotomy through the bifurcation into the internal carotid artery until I reached an  area that was not diseased.  At this point, I took the [place] shunt that previously been prepared and I inserted it into the internal carotid artery first, and then into the common carotid artery taking care to flush and de-air prior to release of control. At this point, I started the endarterectomy in the common carotid artery with a Penfield elevator and carried this dissection down into the common carotid artery circumferentially.  Then I transected the plaque at a segment where it was adherent and transected the plaque with Potts scissors.  I then carried this dissection up into the external carotid artery.  The plaque was extracted by unclamping the external carotid artery and performing an eversion endarterectomy.  The dissection was then carried into the internal carotid artery where a nice feathered end point was created with gentle traction.  I passed the plaque off the field as a specimen. At this point I removed all loose flecks and remaining disease possible.  At this point, I was satisfied that the minimal remaining disease was densely adherent to the wall and wall integrity was intact. The distal endpoint was tacked down with two 7-0 Prolene sutures.  I then fashioned a CorMatrix arterial patch for the artery and sewed it in place with two running stitch of 6-0 Prolene.  I started at the distal endpoint and ran one half the length of the arteriotomy.  I then cut and beveled the patch to an appropriate length to match the arteriotomy.  I started the second 6-0 Prolene at the proximal end point.  The medial suture line was completed and the lateral suture line was run approximately one quarter the length of the arteriotomy.  Prior to completing this patch angioplasty, I removed the shunt first from the internal carotid artery, from which there was excellent backbleeding, and clamped it.  Then I removed the shunt from the common carotid artery, from which there was excellent antegrade bleeding,  and then clamped it.  At this point, I allowed the external carotid artery to backbleed, which was excellent.  Then I instilled heparinized saline in this patched artery and then completed the patch angioplasty in the usual fashion.  First, I released the clamp on the external carotid artery, then I released it on the common carotid artery.  After waiting a few seconds, I then released it on the internal carotid artery. Several minutes of pressure were held and 6-0 Prolene patch sutures were used as need for hemostasis.  At this point, I placed Surgicel and Evicel topical hemostatic agents.  There was no more active bleeding in the surgical site.  The sternocleidomastoid space was closed with three interrupted 3-0 Vicryl sutures. I then reapproximated the platysma muscle with a running stitch of 3-0 Vicryl.  The skin was then closed with a running subcuticular 4-0 Monocryl.  The skin was then cleaned, dried and Dermabond was used to reinforce the skin closure.  The patient awakened and was taken to the recovery room in stable condition, following commands and moving all four extremities without any apparent deficits.    COMPLICATIONS: none  CONDITION: stable  Festus Barren  11/13/2020, 3:34 PM    This note was created with Dragon Medical transcription system. Any errors in dictation are purely unintentional.

## 2020-11-13 NOTE — Transfer of Care (Signed)
Immediate Anesthesia Transfer of Care Note  Patient: Cassandra Schaefer  Procedure(s) Performed: Evacuation of left neck hematoma (Left)  Patient Location: ICU  Anesthesia Type:General  Level of Consciousness: sedated and Patient remains intubated per anesthesia plan  Airway & Oxygen Therapy: Patient remains intubated per anesthesia plan and Patient placed on Ventilator (see vital sign flow sheet for setting)  Post-op Assessment: Report given to RN and Post -op Vital signs reviewed and stable  Post vital signs: Reviewed and stable  Last Vitals:  Vitals Value Taken Time  BP 168/72   Temp    Pulse 82   Resp 12   SpO2 98     Last Pain:  Vitals:   11/13/20 2000  TempSrc: Oral  PainSc:          Complications: No notable events documented.

## 2020-11-13 NOTE — Anesthesia Procedure Notes (Signed)
Procedure Name: Intubation Date/Time: 11/13/2020 9:21 PM Performed by: Irving Burton, CRNA Pre-anesthesia Checklist: Patient identified, Patient being monitored, Timeout performed, Emergency Drugs available and Suction available Patient Re-evaluated:Patient Re-evaluated prior to induction Oxygen Delivery Method: Circle system utilized Preoxygenation: Pre-oxygenation with 100% oxygen Induction Type: IV induction Ventilation: Mask ventilation without difficulty Laryngoscope Size: 3 and McGraph Grade View: Grade I Tube type: Oral Tube size: 7.0 mm Number of attempts: 1 Airway Equipment and Method: Stylet and Video-laryngoscopy Placement Confirmation: ETT inserted through vocal cords under direct vision, positive ETCO2 and breath sounds checked- equal and bilateral Secured at: 21 cm Tube secured with: Tape Dental Injury: Teeth and Oropharynx as per pre-operative assessment  Difficulty Due To: Difficulty was anticipated

## 2020-11-13 NOTE — Interval H&P Note (Signed)
History and Physical Interval Note:  11/13/2020 1:14 PM  Cassandra Schaefer  has presented today for surgery, with the diagnosis of CAROTID ARTERY STENOSIS.  The various methods of treatment have been discussed with the patient and family. After consideration of risks, benefits and other options for treatment, the patient has consented to  Procedure(s): ENDARTERECTOMY CAROTID (Left) as a surgical intervention.  The patient's history has been reviewed, patient examined, no change in status, stable for surgery.  I have reviewed the patient's chart and labs.  Questions were answered to the patient's satisfaction.     Festus Barren

## 2020-11-14 ENCOUNTER — Encounter: Payer: Self-pay | Admitting: Vascular Surgery

## 2020-11-14 DIAGNOSIS — S1093XA Contusion of unspecified part of neck, initial encounter: Secondary | ICD-10-CM | POA: Diagnosis not present

## 2020-11-14 DIAGNOSIS — I63239 Cerebral infarction due to unspecified occlusion or stenosis of unspecified carotid arteries: Secondary | ICD-10-CM | POA: Diagnosis not present

## 2020-11-14 DIAGNOSIS — Z978 Presence of other specified devices: Secondary | ICD-10-CM

## 2020-11-14 LAB — CBC
HCT: 35.3 % — ABNORMAL LOW (ref 36.0–46.0)
Hemoglobin: 11.2 g/dL — ABNORMAL LOW (ref 12.0–15.0)
MCH: 31.1 pg (ref 26.0–34.0)
MCHC: 31.7 g/dL (ref 30.0–36.0)
MCV: 98.1 fL (ref 80.0–100.0)
Platelets: 249 10*3/uL (ref 150–400)
RBC: 3.6 MIL/uL — ABNORMAL LOW (ref 3.87–5.11)
RDW: 12.5 % (ref 11.5–15.5)
WBC: 22.8 10*3/uL — ABNORMAL HIGH (ref 4.0–10.5)
nRBC: 0 % (ref 0.0–0.2)

## 2020-11-14 LAB — BASIC METABOLIC PANEL
Anion gap: 8 (ref 5–15)
BUN: 31 mg/dL — ABNORMAL HIGH (ref 8–23)
CO2: 19 mmol/L — ABNORMAL LOW (ref 22–32)
Calcium: 8.3 mg/dL — ABNORMAL LOW (ref 8.9–10.3)
Chloride: 109 mmol/L (ref 98–111)
Creatinine, Ser: 1.7 mg/dL — ABNORMAL HIGH (ref 0.44–1.00)
GFR, Estimated: 28 mL/min — ABNORMAL LOW (ref >60)
Glucose, Bld: 227 mg/dL — ABNORMAL HIGH (ref 70–99)
Potassium: 5 mmol/L (ref 3.5–5.1)
Sodium: 136 mmol/L (ref 135–145)

## 2020-11-14 LAB — TRIGLYCERIDES: Triglycerides: 66 mg/dL (ref <150)

## 2020-11-14 LAB — GLUCOSE, CAPILLARY
Glucose-Capillary: 135 mg/dL — ABNORMAL HIGH (ref 70–99)
Glucose-Capillary: 136 mg/dL — ABNORMAL HIGH (ref 70–99)
Glucose-Capillary: 193 mg/dL — ABNORMAL HIGH (ref 70–99)
Glucose-Capillary: 121 mg/dL — ABNORMAL HIGH (ref 70–99)

## 2020-11-14 MED ORDER — INSULIN ASPART 100 UNIT/ML IJ SOLN: 0.0000 [IU] | Freq: Every day | INTRAMUSCULAR | Status: DC

## 2020-11-14 MED ORDER — INSULIN ASPART 100 UNIT/ML IJ SOLN
0.0000 [IU] | Freq: Three times a day (TID) | INTRAMUSCULAR | Status: DC
  Administered 2020-11-14: 2 [IU] via SUBCUTANEOUS
  Administered 2020-11-15 (×2): 1 [IU] via SUBCUTANEOUS
  Administered 2020-11-15: 2 [IU] via SUBCUTANEOUS
  Administered 2020-11-16 – 2020-11-17 (×3): 1 [IU] via SUBCUTANEOUS
  Filled 2020-11-14 (×5): qty 1

## 2020-11-14 MED ORDER — SODIUM CHLORIDE 0.9 % IV SOLN: INTRAVENOUS | Status: DC

## 2020-11-14 MED ORDER — OXYCODONE-ACETAMINOPHEN 5-325 MG PO TABS: 1.0000 | ORAL_TABLET | Freq: Four times a day (QID) | ORAL | Status: DC | PRN

## 2020-11-14 NOTE — Anesthesia Postprocedure Evaluation (Signed)
Anesthesia Post Note  Patient: Sheniya Garciaperez Rueger  Procedure(s) Performed: Evacuation of left neck hematoma (Left)  Patient location during evaluation: SICU Anesthesia Type: General Level of consciousness: awake and alert Pain management: pain level controlled Vital Signs Assessment: post-procedure vital signs reviewed and stable Respiratory status: spontaneous breathing, nonlabored ventilation, respiratory function stable and patient connected to nasal cannula oxygen Cardiovascular status: blood pressure returned to baseline and stable Postop Assessment: no apparent nausea or vomiting Anesthetic complications: no   No notable events documented.   Last Vitals:  Vitals:   11/14/20 0600 11/14/20 0611  BP: (!) 129/47   Pulse: 65 63  Resp: 20 12  Temp:    SpO2: 100% 100%    Last Pain:  Vitals:   11/14/20 0500  TempSrc: Oral  PainSc:                  Cleda Mccreedy Jarrett [place]

## 2020-11-14 NOTE — Progress Notes (Signed)
[facility] Daily Progress Note  11/13/20: Left Carotid Endarterectomy  11/13/20: Exploration left neck s/p carotid endarterectomy with evacuation of post-op hematoma  Subjective: Incision site is sore.   Objective: Vitals:   11/14/20 0930 11/14/20 1000 11/14/20 1030 11/14/20 1100  BP: (!) 107/42 (!) 114/48 (!) 116/44 (!) 112/42  Pulse: (!) 57 67 61 (!) 57  Resp: (!) 9 14 15 11   Temp:      TempSrc:      SpO2: 98% 99% 98% 97%  Weight:      Height:        Intake/Output Summary (Last 24 hours) at 11/14/2020 1159 Last data filed at 11/14/2020 0700 Gross per 24 hour  Intake 1242.73 ml  Output 890 ml  Net 352.73 ml   Physical Exam: A&Ox3, NAD Face: symmetrical  Neck:   Incision: Honeycomb dressing intact, clean and dry. Bruising noted. Trachea midline CV: RRR Pulmonary: CTA Bilaterally Abdomen: Soft, Nontender, Nondistended Vascular: warm distally to toes   Laboratory: CBC    Component Value Date/Time   WBC 22.8 (H) 11/14/2020 0529   HGB 11.2 (L) 11/14/2020 0529   HCT 35.3 (L) 11/14/2020 0529   PLT 249 11/14/2020 0529   BMET    Component Value Date/Time   NA 136 11/14/2020 0529   K 5.0 11/14/2020 0529   CL 109 11/14/2020 0529   CO2 19 (L) 11/14/2020 0529   GLUCOSE 227 (H) 11/14/2020 0529   BUN 31 (H) 11/14/2020 0529   CREATININE 1.70 (H) 11/14/2020 0529   CALCIUM 8.3 (L) 11/14/2020 0529   GFRNONAA 28 (L) 11/14/2020 0529   Assessment/Planning: The patient is a 32 female with left carotid artery disease status post carotid endarterectomy with takeback to the operating room for evacuation of hematoma - POD#1  1) Extubated this morning. Currently doing well. 2) Gram drop in hemoglobin. Asymptomatic. CBC in the AM. 3) Patient with known history of chronic kidney disease.  Gentle hydration throughout the day with normal saline at 50cc an hour BMP in the AM. 4) Will remove Foley. Trial of void. 5) Patient is on aspirin, Plavix and Statin 6)  PT / OT / OOB  Discussed with Dr. 95 Amyrie Illingworth PA-C 11/14/2020 11:59 AM

## 2020-11-14 NOTE — Progress Notes (Signed)
Pt extubated at 0556 from the ventilator to 2L [place] this am with no problems.Pt is not in distress and saturating at 100%.

## 2020-11-14 NOTE — Evaluation (Addendum)
Physical Therapy Evaluation Patient Details Name: Cassandra Schaefer MRN: [id number] DOB: 01-24-1929 Today's Date: 11/14/2020   History of Present Illness  87 y.o. female with medical history significant for type 2 diabetes mellitus, essential hypertension, acquired hypothyroidism, hyperlipidemia, stage III chronic kidney disease with baseline creatinine 1.4-1.7, who is admitted to [facility] on 10/19/2020 with suspected TIA versus acute ischemic CVA after presenting from home to [facility] ED complaining of right-sided weakness. Confirmed L basal ganglia ischemic stroke.  Clinical Impression  Pt seen for PT evaluation with co-tx with OT. Pt requires min assist +2 for bed mobility and min assist +2 for sit<>stand EOB. Pt is able to take side steps at EOB, then ambulate around bed to recliner with min/mod assist HHA +2 with R lateral lean, with seated rest break in between. Pt's daughter present during middle of session reporting family can provide 24 hr assist & is eager for pt to d/c home with return to OPPT services. Will continue to follow pt acutely to progress mobility & balance as able.     Follow Up Recommendations Outpatient PT;Supervision/Assistance - 24 hour    Equipment Recommendations   (transport w/c)    Recommendations for Other Services       Precautions / Restrictions Precautions Precautions: Fall Precaution Comments: R hemipareisis Restrictions Weight Bearing Restrictions: No      Mobility  Bed Mobility Overal bed mobility: Needs Assistance Bed Mobility: Supine to Sit     Supine to sit: Min assist;+2 for physical assistance     General bed mobility comments: bed rails    Transfers Overall transfer level: Needs assistance Equipment used: Rolling walker (2 wheeled) Transfers: Sit to/from Stand Sit to Stand: Min assist;+2 physical assistance            Ambulation/Gait Ambulation/Gait assistance: Min assist;Mod assist Gait Distance (Feet):  12 Feet Assistive device: 2 person hand held assist Gait Pattern/deviations: Decreased weight shift to left;Decreased step length - right;Decreased step length - left;Decreased stride length (strong lean to R) Gait velocity: decreased   General Gait Details: Intermittent decreased foot clearance & advancement RLE, decreased weight shift to L  Stairs            Wheelchair Mobility    Modified Rankin (Stroke Patients Only)       Balance Overall balance assessment: Needs assistance Sitting-balance support: Feet supported Sitting balance-Leahy Scale: Fair Sitting balance - Comments: close supervision static sitting, min assist dynamic sitting Postural control: Right lateral lean Standing balance support: Bilateral upper extremity supported Standing balance-Leahy Scale: Poor Standing balance comment: R lateral lean in standing, requires BUE support                             Pertinent Vitals/Pain Pain Assessment: No/denies pain    Home Living Family/patient expects to be discharged to:: Private residence Living Arrangements: Children Available Help at Discharge: Family;Available 24 hours/day Type of Home: House Home Access: Ramped entrance     Home Layout: Multi-level, pt has split level home with a couple steps to access different parts of house with all sets having B rails for hand support.  Home Equipment: Gilmer Mor - single point;Walker - 2 wheels;Bedside commode;Shower seat;Grab bars - tub/shower;Grab bars - toilet      Prior Function Level of Independence: Needs assistance   Gait / Transfers Assistance Needed: CGA +RW for all mobility at home - 24/7 sup from daughters  ADL's / [employer]  Needed: assist for IADLs. Recently started outpatient OT        Hand Dominance   Dominant Hand: Right    Extremity/Trunk Assessment   Upper Extremity Assessment Upper Extremity Assessment: Generalized weakness RUE Deficits / Details: 3+/5 gross  strength RUE Sensation: history of peripheral neuropathy RUE Coordination: decreased fine motor    Lower Extremity Assessment Lower Extremity Assessment: Generalized weakness       Communication   Communication:  (dysarthria)  Cognition Arousal/Alertness: Awake/alert Behavior During Therapy: WFL for tasks assessed/performed Overall Cognitive Status: Within Functional Limits for tasks assessed                                 General Comments: Pt is A and O and pleasant      General Comments General comments (skin integrity, edema, etc.): Some bloody drainage noted from neck incision. Pt c/o feeling "woozy" upon sitting & symptoms lasted throughout session. BP sitting: 126/47 mmHg (MAP 72), BP after standing: 125/49 mmHg (MAP 72), BP after ambulating to recliner: 126/62 mmHg (MAP 70)    Exercises General Exercises - Lower Extremity Long Arc Quad: AROM;Strengthening;Both;10 reps;Seated Hip Flexion/Marching: AROM;Strengthening;Both;10 reps;Seated Other Exercises Other Exercises: Pt and fmaily educated re: OT role, DME recs, d/c recs, falls prevention, HEP Other Exercises: LBD, sup>sit, sit<>stand, grooming, sitting/standing balance/tolerance   Assessment/Plan    PT Assessment Patient needs continued PT services  PT Problem List Decreased strength;Decreased range of motion;Decreased activity tolerance;Decreased balance;Decreased mobility;Decreased safety awareness;Decreased knowledge of use of DME;Decreased coordination;Cardiopulmonary status limiting activity       PT Treatment Interventions DME instruction;Gait training;Stair training;Functional mobility training;Therapeutic activities;Patient/family education;Cognitive remediation;Neuromuscular re-education;Balance training;Therapeutic exercise    PT Goals (Current goals can be found in the Care Plan section)  Acute Rehab PT Goals Patient Stated Goal: to go home and resume outpatient services PT Goal  Formulation: With patient/family Time For Goal Achievement: 11/28/20 Potential to Achieve Goals: Fair    Frequency Min 2X/week   Barriers to discharge        Co-evaluation PT/OT/SLP Co-Evaluation/Treatment: Yes Reason for Co-Treatment: Complexity of the patient's impairments (multi-system involvement);For patient/therapist safety PT goals addressed during session: Balance;Mobility/safety with mobility         AM-PAC PT "6 Clicks" Mobility  Outcome Measure Help needed turning from your back to your side while in a flat bed without using bedrails?: A Little Help needed moving from lying on your back to sitting on the side of a flat bed without using bedrails?: A Little Help needed moving to and from a bed to a chair (including a wheelchair)?: A Little Help needed standing up from a chair using your arms (e.g., wheelchair or bedside chair)?: A Little Help needed to walk in hospital room?: A Lot Help needed climbing 3-5 steps with a railing? : Total 6 Click Score: 15    End of Session   Activity Tolerance: Patient tolerated treatment well Patient left: in chair;with call bell/phone within reach Nurse Communication: Mobility status PT Visit Diagnosis: Muscle weakness (generalized) (M62.81);Difficulty in walking, not elsewhere classified (R26.2)    Time: 7412-8786 PT Time Calculation (min) (ACUTE ONLY): 27 min   Charges:   PT Evaluation $PT Eval Moderate Complexity: 1 Mod PT Treatments $Therapeutic Activity: 8-22 mins        Aleda Grana, PT, DPT 11/14/20, 4:14 PM   Sandi Mariscal 11/14/2020, 4:13 PM

## 2020-11-14 NOTE — Evaluation (Signed)
Occupational Therapy Evaluation Patient Details Name: Cassandra Schaefer MRN: [id number] DOB: [birth year] Today's Date: 11/14/2020    History of Present Illness 87 y.o. female with medical history significant for type 2 diabetes mellitus, essential hypertension, acquired hypothyroidism, hyperlipidemia, stage III chronic kidney disease with baseline creatinine 1.4-1.7, who is admitted to [facility] on 10/19/2020 with suspected TIA versus acute ischemic CVA after presenting from home to [facility] ED complaining of right-sided weakness. Confirmed L basal ganglia ischemic stroke.   Clinical Impression   Cassandra Schaefer was seen for OT evaluation this date. Prior to hospital admission, pt required CGA + RW for all ADLs and mobility. Pt recently started outpatient OT services. Pt lives with daughters in split level home, ramped entrance, and 24/7 supervision. Pt presents to acute OT demonstrating impaired ADL performance and functional mobility 2/2 decreased activity tolerance, functional strength/ROM/balance deficits, and decreased coordination.   Pt currently requires MIN A x2 exit L side of bed. Initial MIN A for sitting balance improving to CGA seated for grooming tasks. MOD A don B socks seated EOB. MIN A x2 + HHA for ADL t/f. Pt would benefit from skilled OT to address noted impairments and functional limitations (see below for any additional details) in order to maximize safety and independence while minimizing falls risk and caregiver burden. Upon hospital discharge, recommend Outpatient OT pending progress and anticipated improvement with pt better rested. Pt would require transport chair to d.c safely (dtr in room reports they have recently looked into purchasing one).     Follow Up Recommendations  Outpatient OT;Supervision/Assistance - 24 hour (pending progress)    Equipment Recommendations  Other (comment) (transport chair)    Recommendations for Other Services       Precautions  / Restrictions Precautions Precautions: Fall Restrictions Weight Bearing Restrictions: No      Mobility Bed Mobility Overal bed mobility: Needs Assistance Bed Mobility: Supine to Sit     Supine to sit: Min assist;+2 for physical assistance          Transfers Overall transfer level: Needs assistance Equipment used: Rolling walker (2 wheeled) Transfers: Sit to/from Stand Sit to Stand: Min assist;+2 physical assistance              Balance Overall balance assessment: Needs assistance Sitting-balance support: Feet supported Sitting balance-Leahy Scale: Fair     Standing balance support: Bilateral upper extremity supported Standing balance-Leahy Scale: Poor                             ADL either performed or assessed with clinical judgement   ADL Overall ADL's : Needs assistance/impaired                                       General ADL Comments: MOD A don B socks seated EOB. MIN A x2 + HHA for ADL t/f. CGA seated grooming tasks     Vision Baseline Vision/History: Wears glasses Wears Glasses: At all times Patient Visual Report: No change from baseline              Pertinent Vitals/Pain Pain Assessment: No/denies pain     Hand Dominance Right   Extremity/Trunk Assessment Upper Extremity Assessment Upper Extremity Assessment: RUE deficits/detail RUE Deficits / Details: 3+/5 gross strength RUE Sensation: history of peripheral neuropathy RUE Coordination: decreased fine motor   Lower Extremity  Assessment Lower Extremity Assessment: Generalized weakness       Communication Communication Communication:  (dysarthria)   Cognition Arousal/Alertness: Awake/alert Behavior During Therapy: WFL for tasks assessed/performed Overall Cognitive Status: Within Functional Limits for tasks assessed                                     General Comments       Exercises Exercises: Other exercises Other  Exercises Other Exercises: Pt and fmaily educated re: OT role, DME recs, d/c recs, falls prevention, HEP Other Exercises: LBD, sup>sit, sit<>stand, grooming, sitting/standing balance/tolerance   Shoulder Instructions      Home Living Family/patient expects to be discharged to:: Private residence Living Arrangements: Children Available Help at Discharge: Family;Available 24 hours/day Type of Home: House Home Access: Ramped entrance     Home Layout: One level     Bathroom Shower/Tub: Chief Strategy Officer: Handicapped height Bathroom Accessibility: Yes How Accessible: Accessible via walker Home Equipment: Cane - single point;Walker - 2 wheels;Bedside commode;Shower seat;Grab bars - tub/shower;Grab bars - toilet      Lives With: Daughter    Prior Functioning/Environment Level of Independence: Needs assistance  Gait / Transfers Assistance Needed: CGA +RW for all mobility at home - 24/7 sup from daughters ADL's / Homemaking Assistance Needed: assist for IADLs. Recently started outpatient OT            OT Problem List: Decreased strength;Decreased activity tolerance;Impaired balance (sitting and/or standing);Decreased safety awareness;Decreased knowledge of use of DME or AE;Decreased range of motion;Decreased coordination      OT Treatment/Interventions: Self-care/ADL training;Therapeutic exercise;Energy conservation;DME and/or AE instruction;Therapeutic activities;Balance training;Patient/family education;Manual therapy    OT Goals(Current goals can be found in the care plan section) Acute Rehab OT Goals Patient Stated Goal: to go home and resume outpatient services OT Goal Formulation: With patient/family Time For Goal Achievement: 11/28/20 Potential to Achieve Goals: Good ADL Goals Pt Will Perform Lower Body Dressing: with modified independence;sit to/from stand (c LRAD PRN) Pt Will Transfer to Toilet: with modified independence;ambulating;regular height  toilet (c LRAD PRN) Pt Will Perform Toileting - Clothing Manipulation and hygiene: with modified independence;sitting/lateral leans  OT Frequency: Min 2X/week    AM-PAC OT "6 Clicks" Daily Activity     Outcome Measure Help from another person eating meals?: A Little Help from another person taking care of personal grooming?: A Little Help from another person toileting, which includes using toliet, bedpan, or urinal?: A Lot Help from another person bathing (including washing, rinsing, drying)?: A Lot Help from another person to put on and taking off regular upper body clothing?: A Little Help from another person to put on and taking off regular lower body clothing?: A Lot 6 Click Score: 15   End of Session Nurse Communication: Mobility status  Activity Tolerance: Patient tolerated treatment well Patient left: in chair;with call bell/phone within reach;with family/visitor present  OT Visit Diagnosis: Unsteadiness on feet (R26.81);Muscle weakness (generalized) (M62.81)                Time: 1445-1510 OT Time Calculation (min): 25 min Charges:  OT General Charges $OT Visit: 1 Visit OT Evaluation $OT Eval Moderate Complexity: 1 Mod  Kathie Dike, M.S. OTR/L  11/14/20, 3:39 PM  ascom [phone]

## 2020-11-14 NOTE — Consult Note (Addendum)
NAME:  Cassandra Schaefer, MRN:  [id number], DOB:  December 22, 1928, LOS: 1 ADMISSION DATE:  11/13/2020, CONSULTATION DATE:  11/14/2020 REFERRING MD: Levora Dredge MD, CHIEF COMPLAINT:  left neck hematoma status post left carotid endarterectomy.   History of present illness   87 year old right-handed female with history of T2DM, HTN, hypothyroidism, CKD III who was initially admitted to [facility] on 10/19/2020 with difficulty walking due to right-sided weakness and sensory deficits as well as word finding deficits that started the day before. Patient was deemed not a candidate for IV alteplase due to being outside tPA window.  MRI brain done showing subtle diffusion abnormality left basal ganglia with few scattered remote lacunar infarcts.  CTA head neck showed 70% stenosis left carotid bulb with associated severe stenosis at origin of dominant left-VA.  Stroke felt to be likely due to carotid artery stenosis and Dr. Wyn Quaker consulted for input but patient had worsening of symptoms with lethargy on 06/27.  Follow-up MRI showed progression of infarct with increasing signs from 5 to 15 mm.  Plavix added to ASA per input from Neurology. She has had issues with tachycardia with occasional PACs but no A. Fib--Cardiology felt that stroke due to CAS and recommended CEA when appropriate.  Dr. Wyn Quaker recommended waiting a couple of weeks prior to stenting of L-CEA. Patient with resultant mild dysarthria with word finding deficits and right sided weakness affecting mobility and ADLs therefore she was sent to [name] for rehab post discharge.  Patient presented back to the hospital for left carotid endarterectomy as a surgical intervention for carotid artery stenosis.  Patient underwent left carotid endarterectomy with CorMatrix arterial patch reconstruction on 7/21 and was transferred to the ICU post surgery for close monitoring. Patient unfortunately was noted with increasing swelling of the left neck s/p left carotid endarterectomy with  associated mild headache and pain. On bedside exam, she was afebrile with blood pressure 192/45 mm Hg and pulse rate 81 beats/min. There were no focal neurological deficits; she was alert and oriented x4, and she did not demonstrate any difficulty breathing or signs of airway compromise. On call vascular Dr. Gilda Crease was contacted who evaluated patient at the bedside and decided to take patient to the OR for evacuation of hematoma to fix the source of bleeding, prevent respiratory compromise and allow for better healing. Patient had reexploration left neck status post carotid endarterectomy with evacuation of hematoma postop and was returned to the ICU intubated. PCCM consulted to assist with vent management.  Past Medical History  Hyperlipidemia, unspecified Hypertension Hypothyroidism, acquired Type 2 diabetes mellitus (HCC) Right hemiparesis (HCC) Ischemic stroke (HCC) PAC (premature atrial contraction  Significant Hospital Events   7/21: Presented to the hospital for Left carotid endarterectomy as a surgical intervention for carotid artery stenosis  Consults:  PCCM  Procedures:  7/21:Left carotid endarterectomy 7/21:reexploration left neck status post carotid endarterectomy with evacuation of hematoma  Significant Diagnostic Tests:  NONE Micro Data:  7/19: SARS-CoV-2 PCR>> negative 7/19: Influenza PCR>> negative  Antimicrobials:  Vancomycin 7/21 >stopped  OBJECTIVE  Blood pressure (!) 120/51, pulse 98, temperature (!) 97.5 F (36.4 C), temperature source Oral, resp. rate 15, height 5\' 3"  (1.6 m), weight 58.4 kg, SpO2 100 %.    Vent Mode: PRVC FiO2 (%):  [40 %] 40 % Set Rate:  [10 bmp] 10 bmp Vt Set:  [450 mL] 450 mL PEEP:  [5 cmH20] 5 cmH20   Intake/Output Summary (Last 24 hours) at 11/14/2020 0034 Last data filed at 11/13/2020 2300  Gross per 24 hour  Intake 600 ml  Output 575 ml  Net 25 ml   Filed Weights   11/13/20 1200 11/13/20 1715  Weight: 60.8 kg 58.4 kg    Physical Examination  GENERAL: 87 year-old critically ill patient lying in the bed intubated, mechanically ventillated and sedated EYES: Pupils equal, round, reactive to light and accommodation. No scleral icterus. Extraocular muscles intact.  HEENT: Head atraumatic, normocephalic. Oropharynx and nasopharynx clear.  NECK:  Supple, no jugular venous distention. No thyroid enlargement, no tenderness. See below left neck surgical site pre and post revision. LUNGS: Normal breath sounds bilaterally, no wheezing, rales,rhonchi or crepitation. No use of accessory muscles of respiration.  CARDIOVASCULAR: S1, S2 normal. No murmurs, rubs, or gallops.  ABDOMEN: Soft, nontender, nondistended. Bowel sounds present. No organomegaly or mass.  EXTREMITIES: No pedal edema, cyanosis, or clubbing.  NEUROLOGIC: Cranial nerves II through XII are intact.  strength not tested Sensation intact. Gait not checked.  PSYCHIATRIC: The patient is intubated and sedated SKIN:  Pre-revision   Post revision   Labs/imaging that I havepersonally reviewed  (right click and "Reselect all SmartList Selections" daily)    Labs   CBC: Recent Labs  Lab 11/11/20 1109  WBC 19.7*  NEUTROABS 13.5*  HGB 12.5  HCT 38.3  MCV 93.9  PLT 377    Basic Metabolic Panel: Recent Labs  Lab 11/11/20 1109  NA 137  K 3.6  CL 102  CO2 26  GLUCOSE 196*  BUN 28*  CREATININE 1.36*  CALCIUM 9.1   GFR: Estimated Creatinine Clearance: 22.7 mL/min (A) (by C-G formula based on SCr of 1.36 mg/dL (H)). Recent Labs  Lab 11/11/20 1109  WBC 19.7*    Liver Function Tests: No results for input(s): AST, ALT, ALKPHOS, BILITOT, PROT, ALBUMIN in the last 168 hours. No results for input(s): LIPASE, AMYLASE in the last 168 hours. No results for input(s): AMMONIA in the last 168 hours.  ABG No results found for: PHART, PCO2ART, PO2ART, HCO3, TCO2, ACIDBASEDEF, O2SAT   Coagulation Profile: No results for input(s): INR, PROTIME in  the last 168 hours.  Cardiac Enzymes: No results for input(s): CKTOTAL, CKMB, CKMBINDEX, TROPONINI in the last 168 hours.  HbA1C: Hgb A1c MFr Bld  Date/Time Value Ref Range Status  10/19/2020 09:01 PM 7.0 (H) 4.8 - 5.6 % Final    Comment:    (NOTE)         Prediabetes: 5.7 - 6.4         Diabetes: >6.4         Glycemic control for adults with diabetes: <7.0     CBG: Recent Labs  Lab 11/07/20 0604 11/13/20 1139 11/13/20 1557  GLUCAP 159* 138* 139*    Review of Systems:   UNABLE TO OBTAIN DUE PATIENT CURRENTLY INTUBATED AND SEDATED  Past Medical History  She,  has a past medical history of Anemia, Cough, Diabetes mellitus without complication (HCC), Gout, Hypertension, Hypothyroidism, PUD (peptic ulcer disease), Stroke (HCC), and Wears dentures.   Surgical History    Past Surgical History:  Procedure Laterality Date   AMPUTATION TOE Left 12/14/2017   Procedure: AMPUTATION TOE-4TH MPJ;  Surgeon: Gwyneth RevelsFowler, Justin, DPM;  [facility];  Service: Podiatry;  Laterality: Left;  IVA LOCAL Diabetic - oral meds   APPENDECTOMY     CATARACT EXTRACTION W/PHACO Right 06/23/2015   Procedure: CATARACT EXTRACTION PHACO AND INTRAOCULAR LENS PLACEMENT (IOC);  Surgeon: Sallee LangeSteven Dingeldein, MD;  Location: [facility];  Service: Ophthalmology;  Laterality: Right;  [place] 01:28 AP% 23.4 CDE 36.14 fluid pack lot # [id number]   CATARACT EXTRACTION W/PHACO Left 07/28/2015   Procedure: CATARACT EXTRACTION PHACO AND INTRAOCULAR LENS PLACEMENT (IOC);  Surgeon: Sallee Lange, MD;  Location: [facility] OR[facility];  Service: Ophthalmology;  Laterality: Left;  [place]    1:29.7 AP%  24.9 CDE   41.32 fluid casette lot #[id number] H exp 09/23/2016   COONOSCOPY     AND ENDOSCOPY   DILATION AND CURETTAGE OF UTERUS     TONSILLECTOMY     TUBAL LIGATION       Social History   reports that she quit smoking about 36 years ago. Her smoking use included cigarettes. She has never used smokeless tobacco. She reports that she  does not drink alcohol.   Family History   Her family history is negative for Breast cancer.   Allergies Allergies  Allergen Reactions   Penicillins Hives   Sulfa Antibiotics Hives   Tetanus Toxoids Swelling   Macrobid [Nitrofurantoin] Nausea And Vomiting and Other (See Comments)    HYPOTENSION   Procaine Other (See Comments)    "went into shock"   Tuberculin Tests Swelling and Rash     Home Medications  Prior to Admission medications   Medication Sig Start Date End Date Taking? Authorizing Provider  aspirin 81 MG chewable tablet Chew 1 tablet (81 mg total) by mouth daily. 10/25/20  Yes Esaw Grandchild A, DO  atorvastatin (LIPITOR) 80 MG tablet Take 1 tablet (80 mg total) by mouth daily after supper. 11/07/20  Yes Love, Evlyn Kanner, PA-C  clopidogrel (PLAVIX) 75 MG tablet Take 1 tablet (75 mg total) by mouth daily. 11/07/20  Yes Love, Evlyn Kanner, PA-C  levothyroxine (SYNTHROID, LEVOTHROID) 88 MCG tablet Take 88 mcg by mouth daily before breakfast.   Yes [provider]  Multiple Vitamins-Minerals (ICAPS AREDS 2 PO) Take by mouth 2 (two) times daily. Reported on 06/23/2015   Yes [provider]  vitamin B-12 (CYANOCOBALAMIN) 1000 MCG tablet Take 2,000 mcg by mouth daily.    Yes [provider]  cephALEXin (KEFLEX) 250 MG capsule Take 1 capsule (250 mg total) by mouth every 8 (eight) hours. Patient not taking: Reported on 11/10/2020 11/07/20   Love, Evlyn Kanner, PA-C  glucose blood test strip USE TO TEST BLOOD SUGAR DAILY. DX CODE: E11.9 05/24/16   [provider]  metFORMIN (GLUCOPHAGE-XR) 500 MG 24 hr tablet Take 1 tablet (500 mg total) by mouth at bedtime. 11/07/20   Love, Evlyn Kanner, PA-C  predniSONE (STERAPRED UNI-PAK 21 TAB) 5 MG (21) TBPK tablet Use as directed 11/07/20   Love, Evlyn Kanner, PA-C  Scheduled Meds:  aspirin  81 mg Oral Daily   atorvastatin  80 mg Oral QPC supper   Chlorhexidine Gluconate Cloth  6 each Topical Daily   clopidogrel  75 mg Oral Daily    docusate sodium  100 mg Oral Daily   levothyroxine  88 mcg Oral QAC breakfast   polyethylene glycol  17 g Per Tube Daily   Continuous Infusions:  sodium chloride     sodium chloride 75 mL/hr at 11/14/20 0014   sodium chloride     esmolol Stopped (11/13/20 1806)   famotidine (PEPCID) IV     magnesium sulfate bolus IVPB     nitroGLYCERIN Stopped (11/13/20 1807)   propofol (DIPRIVAN) infusion 20 mcg/kg/min (11/14/20 0012)   PRN Meds:.sodium chloride, acetaminophen **OR** acetaminophen, alum & mag hydroxide-simeth, fentaNYL (SUBLIMAZE) injection, guaiFENesin-dextromethorphan, hydrALAZINE, labetalol, magnesium sulfate bolus IVPB,  metoprolol tartrate, morphine injection, ondansetron, oxyCODONE-acetaminophen, phenol, potassium chloride  Assessment & Plan:  Postoperative Mechanical Ventilation  S/p Reexploration left neck status post carotid endarterectomy with evacuation of hematoma postop - Ventilator settings: PRVC  6-8 mL/kg, 40 FiO2, 5 PEEP, continue ventilator support & lung protective strategies - Wean PEEP & FiO2 as tolerated, maintain SpO2 > 90% - Head of bed elevated 30 degrees, VAP protocol in place - Plateau pressures less than 30 cm H20  - Intermittent chest x-ray & ABG PRN - Daily WUA with SBT as tolerated  - Ensure adequate pulmonary hygiene  - PAD protocol in place: continue Fentanyl IVP & Propofol drip  High grade left carotid stenosis s/p Left carotid endarterectomy with CorMatrix arterial patch reconstruction Postoperative left neck hematoma s/p Reexploration with evacuation of hematoma  - Management per vascular  - Continue to monitor for signs and symptoms of ongoing bleeding posterior exploration.  History of recent Left ischemic CVA of the Basal Ganglia in the setting of high grade left carotid stenosis s/p endarterectomy. Currently no residual deficit or new stroke symptoms Hx: Hypertension, HLD - Continue medical management with dual therapy Aspirin 81 mg/day and  Plavix 75 mg /day  - PRN meds to keep systolic BP (SBP) <140 mm Hg  - Continue Atorvastatin 80 mg goal (LDL) <70 mg/dl,    AKI on CKD Stage III - Monitor I&O's / urinary output - Follow BMP - Ensure adequate renal perfusion - Avoid nephrotoxic agents as able - Replace electrolytes as indicated   Diabetes mellitus - CBGs - Sliding scale insulin - Follow ICU hyper/hypoglycemia protocol - Hold home Metformin   Hypothyroidism - Continue home levothyroxine   Best practice:  Diet:  NPO Pain/Anxiety/Delirium protocol (if indicated): Yes (RASS goal 0) VAP protocol (if indicated): Yes DVT prophylaxis: Contraindicated GI prophylaxis: H2B Glucose control:  SSI Yes Central venous access:  N/A Arterial line:  Yes, and it is still needed Foley:  N/A Mobility:  bed rest  PT consulted: N/A Last date of multidisciplinary goals of care discussion [7/21] Code Status:  full code Disposition: ICU   = Goals of Care = Code Status Order: FULL  Primary Emergency Contact: Westley Hummer, Home Phone: [phone] Wishes to pursue full aggressive treatment and intervention options, including CPR and intubation  Critical care time: 45 minutes     Webb Silversmith, DNP, CCRN, FNP-C, AGACNP-BC Acute Care Nurse Practitioner  [facility] Pager: [phone] [place] [facility]  .

## 2020-11-14 NOTE — Progress Notes (Signed)
Per ICU MD, remove A-Line. A-line removed this AM at 0745.   Soft BP's noted this AM, MAPs in low 60s. Low diastolics bringing MAP down. Dr. Wyn Quaker notified and aware; softer BP's preferred to avoid rebleeding post-op.   Per Dr. Wyn Quaker, okay to start pt on clear liquid diet and give PO aspirin and plavix this AM.   Foley removed per order.   Pt up to chair this afternoon with Pt.   Pt in no acute distress @ this time. Will continue to monitor.

## 2020-11-14 NOTE — Progress Notes (Signed)
PT admitted near 1700 to ICU on room air. Pt ox4, no s/s of acute neuro changes, residual right sided weakness noted from previous CVA (Primarily right lower ext). Left neck incision site clean/dry + dermabond in place. No s/s of acute bleeding upon arrival. Foley in place. Pt resting in bed, daughter visiting @ bedside in ICU. A-Line in place (SBP reading 140s-160s, Cuff BP 120s-130s). Dr. Wyn Quaker notified about difference in BP; Instructed to give PRN labetalol first line but rely on cuff BP for now. Per Dr. Wyn Quaker, place pt on clear liquid diet overnight, and then transition to regular diet in AM. Pt in no acute distress @ this time. Will continue to monitor.

## 2020-11-15 LAB — GLUCOSE, CAPILLARY
Glucose-Capillary: 131 mg/dL — ABNORMAL HIGH (ref 70–99)
Glucose-Capillary: 128 mg/dL — ABNORMAL HIGH (ref 70–99)
Glucose-Capillary: 165 mg/dL — ABNORMAL HIGH (ref 70–99)
Glucose-Capillary: 148 mg/dL — ABNORMAL HIGH (ref 70–99)

## 2020-11-15 LAB — CBC
HCT: 30.1 % — ABNORMAL LOW (ref 36.0–46.0)
Hemoglobin: 9.8 g/dL — ABNORMAL LOW (ref 12.0–15.0)
MCH: 31.6 pg (ref 26.0–34.0)
MCHC: 32.6 g/dL (ref 30.0–36.0)
MCV: 97.1 fL (ref 80.0–100.0)
Platelets: 241 10*3/uL (ref 150–400)
RBC: 3.1 MIL/uL — ABNORMAL LOW (ref 3.87–5.11)
RDW: 12.9 % (ref 11.5–15.5)
WBC: 19.4 10*3/uL — ABNORMAL HIGH (ref 4.0–10.5)
nRBC: 0 % (ref 0.0–0.2)

## 2020-11-15 LAB — BASIC METABOLIC PANEL
Anion gap: 8 (ref 5–15)
BUN: 29 mg/dL — ABNORMAL HIGH (ref 8–23)
CO2: 21 mmol/L — ABNORMAL LOW (ref 22–32)
Calcium: 8.6 mg/dL — ABNORMAL LOW (ref 8.9–10.3)
Chloride: 110 mmol/L (ref 98–111)
Creatinine, Ser: 1.47 mg/dL — ABNORMAL HIGH (ref 0.44–1.00)
GFR, Estimated: 34 mL/min — ABNORMAL LOW (ref >60)
Glucose, Bld: 136 mg/dL — ABNORMAL HIGH (ref 70–99)
Potassium: 4.2 mmol/L (ref 3.5–5.1)
Sodium: 139 mmol/L (ref 135–145)

## 2020-11-15 LAB — MAGNESIUM: Magnesium: 1.7 mg/dL (ref 1.7–2.4)

## 2020-11-15 LAB — PHOSPHORUS: Phosphorus: 3.1 mg/dL (ref 2.5–4.6)

## 2020-11-15 MED ORDER — FAMOTIDINE 20 MG PO TABS
20.0000 mg | ORAL_TABLET | Freq: Every day | ORAL | Status: DC
  Administered 2020-11-15 – 2020-11-16 (×2): 20 mg via ORAL
  Filled 2020-11-15 (×2): qty 1

## 2020-11-15 NOTE — Progress Notes (Signed)
Pt without any significant events overnight. Left carotid site without complication. Pt VSS, and pt denies bath. Handoff report given to Debbie,RN. Relinquishing care

## 2020-11-15 NOTE — Progress Notes (Signed)
      Daily Progress Note   Assessment/Planning:   POD #2 s/p L CEA, evac of L neck hematoma  Neuro intact Tolerating PO Per Vascular PA, will observe pt in hospital through the weekend given pt's fragility Ok to transfer to med/surg telemetry PT/OT   Subjective  - 2 Days Post-Op   No complaints   Objective   Vitals:   11/15/20 0500 11/15/20 0600 11/15/20 0730 11/15/20 0800  BP: (!) 140/51  (!) 132/55 (!) 152/46  Pulse: (!) 58 86 69 (!) 59  Resp: 14 (!) 23 16 14   Temp:   98.2 F (36.8 C)   TempSrc:   Oral   SpO2: 96% 96% 94% 96%  Weight:      Height:         Intake/Output Summary (Last 24 hours) at 11/15/2020 0903 Last data filed at 11/15/2020 0800 Gross per 24 hour  Intake 756.37 ml  Output 1100 ml  Net -343.63 ml    PULM  CTAB  CV  RRR  GI  soft, NTND  VASC L neck dressing in place, small hematoma, no active bleeding  NEURO Intact CN, mid-line tongue, motor sym 5/5    Laboratory   CBC CBC Latest Ref Rng & Units 11/15/2020 11/14/2020 11/11/2020  WBC 4.0 - 10.5 K/uL 19.4(H) 22.8(H) 19.7(H)  Hemoglobin 12.0 - 15.0 g/dL 11/13/2020) 11.2(L) 12.5  Hematocrit 36.0 - 46.0 % 30.1(L) 35.3(L) 38.3  Platelets 150 - 400 K/uL 241 249 377    BMET    Component Value Date/Time   NA 139 11/15/2020 0726   K 4.2 11/15/2020 0726   CL 110 11/15/2020 0726   CO2 21 (L) 11/15/2020 0726   GLUCOSE 136 (H) 11/15/2020 0726   BUN 29 (H) 11/15/2020 0726   CREATININE 1.47 (H) 11/15/2020 0726   CALCIUM 8.6 (L) 11/15/2020 0726   GFRNONAA 34 (L) 11/15/2020 0726     11/17/2020, MD, FACS, FSVS Covering for [facility]: [phone]  11/15/2020, 9:03 AM

## 2020-11-15 NOTE — Anesthesia Postprocedure Evaluation (Signed)
Anesthesia Post Note  Patient: Cassandra Schaefer  Procedure(s) Performed: ENDARTERECTOMY CAROTID (Left)  Patient location during evaluation: SICU Anesthesia Type: General Level of consciousness: sedated Pain management: pain level controlled Vital Signs Assessment: post-procedure vital signs reviewed and stable Cardiovascular status: stable Postop Assessment: no apparent nausea or vomiting Anesthetic complications: no   No notable events documented.   Last Vitals:  Vitals:   11/14/20 2300 11/15/20 0000  BP: (!) 132/46 (!) 125/43  Pulse: 64 61  Resp: 14 15  Temp:    SpO2: 96% 95%    Last Pain:  Vitals:   11/15/20 0000  TempSrc:   PainSc: 0-No pain                 Lavergne Hiltunen Lawerance Cruel

## 2020-11-15 NOTE — Plan of Care (Signed)

## 2020-11-15 NOTE — Progress Notes (Signed)
PHARMACIST - PHYSICIAN COMMUNICATION  CONCERNING: IV to Oral Route Change Policy  RECOMMENDATION: This patient is receiving famotidine by the intravenous route.  Based on criteria approved by the Pharmacy and Therapeutics Committee, the intravenous medication(s) is/are being converted to the equivalent oral dose form(s).   DESCRIPTION: These criteria include: The patient is eating (either orally or via tube) and/or has been taking other orally administered medications for a least 24 hours The patient has no evidence of active gastrointestinal bleeding or impaired GI absorption (gastrectomy, short bowel, patient on TNA or NPO).  If you have questions about this conversion, please contact the Pharmacy Department    Tressie Ellis, [facility] 11/15/2020 11:55 AM

## 2020-11-16 LAB — GLUCOSE, CAPILLARY
Glucose-Capillary: 128 mg/dL — ABNORMAL HIGH (ref 70–99)
Glucose-Capillary: 108 mg/dL — ABNORMAL HIGH (ref 70–99)
Glucose-Capillary: 127 mg/dL — ABNORMAL HIGH (ref 70–99)
Glucose-Capillary: 143 mg/dL — ABNORMAL HIGH (ref 70–99)

## 2020-11-16 NOTE — Progress Notes (Signed)
      Daily Progress Note   Assessment/Planning:   POD #3 s/p L CEA, evac of L neck hematoma  Neuro intact Tolerating PO No signs of bleeding Possible discharge tomorrow   Subjective  - 3 Days Post-Op   No complaints   Objective   Vitals:   11/16/20 0400 11/16/20 0800 11/16/20 0900 11/16/20 0924  BP: (!) 151/51 (!) 156/69 (!) 165/52 (!) 182/58  Pulse: 65 70 (!) 58 65  Resp: 16 19 16 20   Temp: 98.2 F (36.8 C)   98.2 F (36.8 C)  TempSrc: Oral     SpO2: 96% 100% 97% 99%  Weight:      Height:         Intake/Output Summary (Last 24 hours) at 11/16/2020 1001 Last data filed at 11/16/2020 0700 Gross per 24 hour  Intake 779.31 ml  Output 1585 ml  Net -805.69 ml    PULM  CTAB  CV  RRR  GI  soft, NTND  VASC Neck swelling: mild, dressing in place without active bleeding  NEURO CN intact, motor sym, mid-line tongue    Laboratory   CBC CBC Latest Ref Rng & Units 11/15/2020 11/14/2020 11/11/2020  WBC 4.0 - 10.5 K/uL 19.4(H) 22.8(H) 19.7(H)  Hemoglobin 12.0 - 15.0 g/dL 11/13/2020) 11.2(L) 12.5  Hematocrit 36.0 - 46.0 % 30.1(L) 35.3(L) 38.3  Platelets 150 - 400 K/uL 241 249 377    BMET    Component Value Date/Time   NA 139 11/15/2020 0726   K 4.2 11/15/2020 0726   CL 110 11/15/2020 0726   CO2 21 (L) 11/15/2020 0726   GLUCOSE 136 (H) 11/15/2020 0726   BUN 29 (H) 11/15/2020 0726   CREATININE 1.47 (H) 11/15/2020 0726   CALCIUM 8.6 (L) 11/15/2020 0726   GFRNONAA 34 (L) 11/15/2020 0726     11/17/2020, MD, FACS, FSVS Covering for [facility]: [phone]  11/16/2020, 10:01 AM

## 2020-11-16 NOTE — Progress Notes (Signed)
Patient arrived to unit from ICU via bed in stable condition. 

## 2020-11-16 NOTE — Progress Notes (Signed)
Received call from tele stating patient had converted from SR to Afib, rate controlled 60-70s. Dr. Imogene Burn notified. No new orders obtained at this time.

## 2020-11-16 NOTE — Progress Notes (Signed)
This nurse spoke with Talbert Forest, the patient's daughter, to inform her patient is now in room 104.  -nothing follows

## 2020-11-16 NOTE — Progress Notes (Signed)
      Daily Progress Note  Called to see pt for run of afib on monitor.  Pt denies any cardiac sx.   Vitals:   11/16/20 0900 11/16/20 0924 11/16/20 1226 11/16/20 1541  BP: (!) 165/52 (!) 182/58 (!) 140/54 (!) 172/57  Pulse: (!) 58 65 69 68  Resp: 16 20 18 20   Temp:  98.2 F (36.8 C) 97.7 F (36.5 C) 97.8 F (36.6 C)  TempSrc:    Oral  SpO2: 97% 99% 100% 100%  Weight:      Height:       On exam, pt has regular rate and rhythm.  BP as above.  - On exam, no obvious afib. - Given neck hematoma, pt is not an anticoagulation candidate  - HR also remains in normal range - Will get EKG to evaluate   , MD, FACS, FSVS Covering for [facility]: [phone]  11/16/2020, 5:53 PM

## 2020-11-17 DIAGNOSIS — I63239 Cerebral infarction due to unspecified occlusion or stenosis of unspecified carotid arteries: Secondary | ICD-10-CM | POA: Diagnosis not present

## 2020-11-17 LAB — SURGICAL PATHOLOGY

## 2020-11-17 LAB — CBC
HCT: 29 % — ABNORMAL LOW (ref 36.0–46.0)
Hemoglobin: 9.4 g/dL — ABNORMAL LOW (ref 12.0–15.0)
MCH: 31 pg (ref 26.0–34.0)
MCHC: 32.4 g/dL (ref 30.0–36.0)
MCV: 95.7 fL (ref 80.0–100.0)
Platelets: 204 10*3/uL (ref 150–400)
RBC: 3.03 MIL/uL — ABNORMAL LOW (ref 3.87–5.11)
RDW: 12.7 % (ref 11.5–15.5)
WBC: 13.5 10*3/uL — ABNORMAL HIGH (ref 4.0–10.5)
nRBC: 0 % (ref 0.0–0.2)

## 2020-11-17 LAB — GLUCOSE, CAPILLARY
Glucose-Capillary: 136 mg/dL — ABNORMAL HIGH (ref 70–99)
Glucose-Capillary: 125 mg/dL — ABNORMAL HIGH (ref 70–99)

## 2020-11-17 MED ORDER — COLCHICINE 0.6 MG PO TABS
0.6000 mg | ORAL_TABLET | Freq: Every day | ORAL | Status: DC
  Administered 2020-11-17: 13:00:00 0.6 mg via ORAL
  Filled 2020-11-17: qty 1

## 2020-11-17 MED ORDER — OXYCODONE-ACETAMINOPHEN 5-325 MG PO TABS: 1.0000 | ORAL_TABLET | Freq: Three times a day (TID) | ORAL | 0 refills | Status: DC | PRN

## 2020-11-17 MED ORDER — COLCHICINE 0.6 MG PO TABS: 0.3000 mg | ORAL_TABLET | Freq: Every day | ORAL | 0 refills | Status: AC

## 2020-11-17 NOTE — Discharge Summary (Signed)
[facility]    Discharge Summary  Patient ID:  Cassandra Schaefer MRN: [id number] DOB/AGE: [birth year] 87 y.o.  Admit date: 11/13/2020 Discharge date: 11/17/2020 Date of Surgery: 11/13/2020 Surgeon: Surgeon(s): Schnier, Latina Craver, MD  Admission Diagnosis: Carotid stenosis, symptomatic, with infarction [facility]) [I63.239]  Discharge Diagnoses:  Carotid stenosis, symptomatic, with infarction [facility]) [I63.239]  Secondary Diagnoses: Past Medical History:  Diagnosis Date   Anemia    Cough    RESOLVING, NO FEVER   Diabetes mellitus without complication (HCC)    Gout    Hypertension    Hypothyroidism    PUD (peptic ulcer disease)    Stroke (HCC)    Wears dentures    full upper, partial lower   Procedure(s): 11/13/20: Left Carotid Endarterectomy   11/13/20: Exploration left neck s/p carotid endarterectomy with evacuation of post-op hematoma  Discharged Condition: Good  HPI / Hospital Course:  Cassandra Schaefer is a 87 y.o. female who presents with recent stroke and a left carotid stenosis of >80%.  I discussed with the patient the risks, benefits, and alternatives to carotid endarterectomy.  I discussed the differences between carotid stenting and carotid endarterectomy. I discussed the procedural details of carotid endarterectomy with the patient.  The patient is aware that the risks of carotid endarterectomy include but are not limited to: bleeding, infection, stroke, myocardial infarction, death, cranial nerve injuries both temporary and permanent, neck hematoma, possible airway compromise, labile blood pressure post-operatively, cerebral hyperperfusion syndrome, and possible need for additional interventions in the future. The patient is aware of the risks and agrees to proceed forward with the procedure. On 11/13/20 the patient underwent:  Left Carotid Endarterectomy  The patient tolerated the procedure well and was transferred from the operating room to the ICU  for observation.  Few hours after surgery, the patient's flank was noted to be swollen.  No respiratory distress noted.  The patient was taken back to the operating room and underwent:  Exploration left neck s/p carotid endarterectomy with evacuation of post-op hematoma  Possible branch/facial nerve bleeding was identified and hemostasis achieved.  Patient tolerated the procedure was transferred back to the ICU.  Patient's night of surgery was unremarkable.  During the rest of her stay, her diet was advanced, her Foley was removed and she was urinating independently, her discomfort was controlled to the use of p.o. pain medication and she was ambulating at baseline.  Day of discharge, the patient was afebrile with stable vital signs and with the exception of some bruising to the neck physical exam is unremarkable.  Physical Exam:  A&Ox3, NAD Face: Symmetrical, tongue midline Neck:             Incision: Honeycomb dressing intact, clean and dry.  Bruising noted.  Trachea midline CV: Regular rate and rhythm Pulmonary: CTA Bilaterally Abdomen: Soft, Nontender, Nondistended Vascular: warm distally to toes  Labs: As below  Complications:  None life-threatening postoperative bleeding. Take back to the operating room where hemostasis was achieved.  Consults: None  Significant Diagnostic Studies: CBC Lab Results  Component Value Date   WBC 13.5 (H) 11/17/2020   HGB 9.4 (L) 11/17/2020   HCT 29.0 (L) 11/17/2020   MCV 95.7 11/17/2020   PLT 204 11/17/2020   BMET    Component Value Date/Time   NA 139 11/15/2020 0726   K 4.2 11/15/2020 0726   CL 110 11/15/2020 0726   CO2 21 (L) 11/15/2020 0726   GLUCOSE 136 (H) 11/15/2020 1191  BUN 29 (H) 11/15/2020 0726   CREATININE 1.47 (H) 11/15/2020 0726   CALCIUM 8.6 (L) 11/15/2020 0726   GFRNONAA 34 (L) 11/15/2020 0726   COAG Lab Results  Component Value Date   INR 1.1 10/19/2020   Disposition:  Discharge to :Home  Allergies as of  11/17/2020       Reactions   Penicillins Hives   Sulfa Antibiotics Hives   Tetanus Toxoids Swelling   Macrobid [nitrofurantoin] Nausea And Vomiting, Other (See Comments)   HYPOTENSION   Procaine Other (See Comments)   "went into shock"   Tuberculin Tests Swelling, Rash        Medication List     STOP taking these medications    cephALEXin 250 MG capsule Commonly known as: KEFLEX       TAKE these medications    aspirin 81 MG chewable tablet Chew 1 tablet (81 mg total) by mouth daily.   atorvastatin 80 MG tablet Commonly known as: LIPITOR Take 1 tablet (80 mg total) by mouth daily after supper.   clopidogrel 75 MG tablet Commonly known as: PLAVIX Take 1 tablet (75 mg total) by mouth daily.   colchicine 0.6 MG tablet Take 0.5 tablets (0.3 mg total) by mouth daily.   glucose blood test strip USE TO TEST BLOOD SUGAR DAILY. DX CODE: E11.9   ICAPS AREDS 2 PO Take by mouth 2 (two) times daily. Reported on 06/23/2015   levothyroxine 88 MCG tablet Commonly known as: SYNTHROID Take 88 mcg by mouth daily before breakfast.   metFORMIN 500 MG 24 hr tablet Commonly known as: GLUCOPHAGE-XR Take 1 tablet (500 mg total) by mouth at bedtime.   oxyCODONE-acetaminophen 5-325 MG tablet Commonly known as: PERCOCET/ROXICET Take 1 tablet by mouth every 8 (eight) hours as needed for moderate pain or severe pain.   predniSONE 5 MG (21) Tbpk tablet Commonly known as: STERAPRED UNI-PAK 21 TAB Use as directed   vitamin B-12 1000 MCG tablet Commonly known as: CYANOCOBALAMIN Take 2,000 mcg by mouth daily.       Verbal and written Discharge instructions given to the patient. Wound care per Discharge AVS  Follow-up Information     Schnier, Latina Craver, MD. Go on 11/20/2020.   Specialties: Vascular Surgery, Cardiology, Radiology, Vascular Surgery Why: @2 :30pm Contact information: [phone] [place] [phone]                Signed: [phone], PA-C  11/17/2020, 1:27 PM

## 2020-11-17 NOTE — TOC Initial Note (Signed)
Transition of Care [facility]) - Initial/Assessment Note    Patient Details  Name: Cassandra Schaefer MRN: [id number] Date of Birth: 10/24/1928  [facility] CM/SW Contact:    Shelbie Hutching, RN Phone Number: 11/17/2020, 11:09 AM  Clinical Narrative:                 Patient medically cleared for discharge home today.  RNCM met with patient and daughter at the bedside.  Daughter will transport patient home.  Patient lives with her daughter Cassandra Schaefer and has all needed DME at home.  Patient went to [name] about 3 weeks ago after stroke then she discharged home and is currently doing OP Rehab here at [facility].  Patient will cont OP therapy at discharge.   No discharge needs identified.    Expected Discharge Plan: OP Rehab Barriers to Discharge: Barriers Resolved   Patient Goals and CMS Choice Patient states their goals for this hospitalization and ongoing recovery are:: Excited to go home CMS Medicare.gov Compare Post Acute Care list provided to:: Patient Choice offered to / list presented to : Patient  Expected Discharge Plan and Services Expected Discharge Plan: OP Rehab   Discharge Planning Services: CM Consult Post Acute Care Choice: NA, Resumption of Svcs/PTA Provider (OP therapy at [facility]) Living arrangements for the past 2 months: [place] Expected Discharge Date: 11/17/20               DME Arranged: N/A DME Agency: NA       HH Arranged: NA [place] Agency: NA        Prior Living Arrangements/Services Living arrangements for the past 2 months: Single Family Home Lives with:: Adult Children Patient language and need for interpreter reviewed:: Yes Do you feel safe going back to the place where you live?: Yes      Need for Family Participation in Patient Care: Yes (Comment) (carotid stenosis) Care giver support system in place?: Yes (comment) (daughter) Current home services: DME (elevated toilet seat, walker, shower chair) Criminal Activity/Legal Involvement Pertinent to  Current Situation/Hospitalization: No - Comment as needed  Activities of Daily Living      Permission Sought/Granted Permission sought to share information with : Case Manager, Family Supports Permission granted to share information with : Yes, Verbal Permission Granted  Share Information with NAME: Cassandra Schaefer     Permission granted to share info w Relationship: daughter     Emotional Assessment Appearance:: Appears stated age Attitude/Demeanor/Rapport: Engaged Affect (typically observed): Accepting Orientation: : Oriented to Self, Oriented to Place, Oriented to  Time, Oriented to Situation Alcohol / Substance Use: Not Applicable Psych Involvement: No (comment)  Admission diagnosis:  Carotid stenosis, symptomatic, with infarction [facility]) [I63.239] Patient Active Problem List   Diagnosis Date Noted   Carotid stenosis, symptomatic, with infarction ([place]) 11/13/2020   Gout flare 11/10/2020   UTI (urinary tract infection) 11/10/2020   Left carotid artery stenosis 11/10/2020   Chronic kidney disease 11/10/2020   Left basal ganglia embolic stroke ([place]) 01/16/3006   PAC (premature atrial contraction)    Pre-procedural cardiovascular examination    Ischemic stroke ([place]) 10/20/2020   TIA (transient ischemic attack) 10/19/2020   Right hemiparesis ([place]) 10/19/2020   Type 2 diabetes mellitus with stage 3 chronic kidney disease, without long-term current use of insulin ([place]) 08/24/2018   Hyperlipidemia, unspecified 07/19/2017   Hypertension 07/19/2017   PUD (peptic ulcer disease) 07/19/2017   Type 2 diabetes mellitus ([place]) 07/19/2017   Personal history of gout 08/12/2016  Anemia, unspecified 04/09/2016   Hypothyroidism, acquired 12/10/2015   PCP:  Tracie Harrier, MD Pharmacy:   [id number] [place] [id number]: [phone]: [phone]    Social Determinants of Health (SDOH) Interventions    Readmission  Risk Interventions No flowsheet data found.

## 2020-11-17 NOTE — Care Management Important Message (Signed)
Important Message  Patient Details  Name: Cassandra Schaefer MRN: [id number] Date of Birth: 04/16/1929   Medicare Important Message Given:  Yes     Johnell Comings 11/17/2020, 11:31 AM

## 2020-11-17 NOTE — Discharge Instructions (Signed)
Vascular Surgery Discharge Instructions:  1) You may shower.  Gently remove your dressing.  Gently clean the incisions with warm water.  Gently pat dry. 2) Please do not engage in strenuous activity or lifting greater than 10 pounds until you are cleared at one of your postoperative follow-ups.

## 2020-11-19 ENCOUNTER — Ambulatory Visit: Payer: Medicare HMO

## 2020-11-19 ENCOUNTER — Other Ambulatory Visit (INDEPENDENT_AMBULATORY_CARE_PROVIDER_SITE_OTHER): Payer: Self-pay | Admitting: Nurse Practitioner

## 2020-11-19 ENCOUNTER — Ambulatory Visit: Payer: Medicare HMO | Admitting: Occupational Therapy

## 2020-11-19 ENCOUNTER — Telehealth (INDEPENDENT_AMBULATORY_CARE_PROVIDER_SITE_OTHER): Payer: Self-pay

## 2020-11-19 NOTE — Telephone Encounter (Signed)
Let me talk with Dr. Wyn Quaker and see what his throughts were and I'll submit once we've discussed

## 2020-11-19 NOTE — Telephone Encounter (Signed)
The pt's daughter called and left a VM on the nurses line that per a conversation she had with DR. Dew at the hospital she would like to know could you put in an order to resume her PT. Per the pt's chart she uses  [facility]. Please advise.

## 2020-11-20 ENCOUNTER — Ambulatory Visit (INDEPENDENT_AMBULATORY_CARE_PROVIDER_SITE_OTHER): Payer: Medicare HMO | Admitting: Vascular Surgery

## 2020-11-24 ENCOUNTER — Other Ambulatory Visit: Payer: Self-pay

## 2020-11-24 ENCOUNTER — Ambulatory Visit (INDEPENDENT_AMBULATORY_CARE_PROVIDER_SITE_OTHER): Payer: Medicare HMO | Admitting: Vascular Surgery

## 2020-11-24 VITALS — BP 166/62 | HR 85 | Ht 63.0 in | Wt 111.0 lb

## 2020-11-24 DIAGNOSIS — I63239 Cerebral infarction due to unspecified occlusion or stenosis of unspecified carotid arteries: Secondary | ICD-10-CM

## 2020-11-25 ENCOUNTER — Encounter (INDEPENDENT_AMBULATORY_CARE_PROVIDER_SITE_OTHER): Payer: Self-pay | Admitting: Vascular Surgery

## 2020-11-25 ENCOUNTER — Telehealth: Payer: Self-pay | Admitting: *Deleted

## 2020-11-25 NOTE — Telephone Encounter (Signed)
Daughter Cassandra Schaefer called to ask what the appt is for on Thursday with Dr Wynn Banker. It is hard to get her here from [place]. I explained it is hospital follow up and Dr Wynn Banker is writing for rehab orders. Cassandra Schaefer has been back in hospital and the vascular MD wrote her therapy orders and they are wondering if he can just sign them and her not come to this appt. I have advised that if she has someone ok with rehab orders, she would not have to come to [place].  She will call back after she clears it with  Rehab and cancel.

## 2020-11-25 NOTE — Addendum Note (Signed)
Addended by: Renford Dills on: 11/25/2020 03:48 PM   Modules accepted: Orders

## 2020-11-25 NOTE — Progress Notes (Signed)
Patient ID: Cassandra Schaefer, female   DOB: [birth year], 87 y.o.   MRN: [id number]  Chief Complaint  Patient presents with   Follow-up    [facility] post endarterectomy carotid . Suture removal.     HPI Cassandra Schaefer is a 87 y.o. female.    Patient returns the office status post left CEA with a return to the OR for evacuation hematoma.  She states that for several days she was feeling very tired but in the last 48 to 72 hours has regained a huge amount of energy and is feeling much better.  No other complaints   Past Medical History:  Diagnosis Date   Anemia    Cough    RESOLVING, NO FEVER   Diabetes mellitus without complication ([place])    Gout    Hypertension    Hypothyroidism    PUD (peptic ulcer disease)    Stroke [facility])    Wears dentures    full upper, partial lower    Past Surgical History:  Procedure Laterality Date   AMPUTATION TOE Left 12/14/2017   Procedure: AMPUTATION TOE-4TH MPJ;  Surgeon: Samara Deist, DPM;  Location: [facility];  Service: Podiatry;  Laterality: Left;  IVA LOCAL Diabetic - oral meds   APPENDECTOMY     CATARACT EXTRACTION W/PHACO Right 06/23/2015   Procedure: CATARACT EXTRACTION PHACO AND INTRAOCULAR LENS PLACEMENT (IOC);  Surgeon: Estill Cotta, MD;  Location: [facility];  Service: Ophthalmology;  Laterality: Right;  [place] 01:28 AP% 23.4 CDE 36.14 fluid pack lot # [id number]   CATARACT EXTRACTION W/PHACO Left 07/28/2015   Procedure: CATARACT EXTRACTION PHACO AND INTRAOCULAR LENS PLACEMENT (IOC);  Surgeon: Estill Cotta, MD;  Location: [facility];  Service: Ophthalmology;  Laterality: Left;  [place]    1:29.7 AP%  24.9 CDE   41.32 fluid casette lot #[id number] H exp 09/23/2016   COONOSCOPY     AND ENDOSCOPY   DILATION AND CURETTAGE OF UTERUS     ENDARTERECTOMY Left 11/13/2020   Procedure: Evacuation of left neck hematoma;  Surgeon: Katha Cabal, MD;  Location: [facility];  Service: Vascular;  Laterality: Left;   ENDARTERECTOMY Left 11/13/2020    Procedure: ENDARTERECTOMY CAROTID;  Surgeon: Algernon Huxley, MD;  Location: [facility];  Service: Vascular;  Laterality: Left;   TONSILLECTOMY     TUBAL LIGATION        Allergies  Allergen Reactions   Penicillins Hives   Sulfa Antibiotics Hives   Tetanus Toxoids Swelling   Macrobid [Nitrofurantoin] Nausea And Vomiting and Other (See Comments)    HYPOTENSION   Procaine Other (See Comments)    "went into shock"   Tuberculin Tests Swelling and Rash    Current Outpatient Medications  Medication Sig Dispense Refill   aspirin 81 MG chewable tablet Chew 1 tablet (81 mg total) by mouth daily.     atorvastatin (LIPITOR) 80 MG tablet Take 1 tablet (80 mg total) by mouth daily after supper. 30 tablet 0   Blood Glucose Monitoring Suppl (FIFTY50 GLUCOSE METER 2.0) w/Device KIT Use as directed Dx code: 250.00     clopidogrel (PLAVIX) 75 MG tablet Take 1 tablet (75 mg total) by mouth daily. 30 tablet 0   fosinopril (MONOPRIL) 10 MG tablet Take 1 tablet by mouth daily.     glucose blood test strip USE TO TEST BLOOD SUGAR DAILY. DX CODE: E11.9     iron polysaccharides (NIFEREX) 150 MG capsule Take by mouth.     levothyroxine (SYNTHROID) 75 MCG  tablet Take 75 mcg by mouth daily.     metFORMIN (GLUCOPHAGE-XR) 500 MG 24 hr tablet Take 1 tablet (500 mg total) by mouth at bedtime. 30 tablet 0   Multiple Vitamins-Minerals (ICAPS AREDS 2 PO) Take by mouth 2 (two) times daily. Reported on 06/23/2015     vitamin B-12 (CYANOCOBALAMIN) 1000 MCG tablet Take 2,000 mcg by mouth daily.      colchicine 0.6 MG tablet Take 0.5 tablets (0.3 mg total) by mouth daily. (Patient not taking: Reported on 11/24/2020) 30 tablet 0   levothyroxine (SYNTHROID, LEVOTHROID) 88 MCG tablet Take 88 mcg by mouth daily before breakfast. (Patient not taking: Reported on 11/24/2020)     oxyCODONE-acetaminophen (PERCOCET/ROXICET) 5-325 MG tablet Take 1 tablet by mouth every 8 (eight) hours as needed for moderate pain or severe pain. (Patient not  taking: Reported on 11/24/2020) 20 tablet 0   predniSONE (STERAPRED UNI-PAK 21 TAB) 5 MG (21) TBPK tablet Use as directed (Patient not taking: Reported on 11/24/2020)     No current facility-administered medications for this visit.        Physical Exam BP (!) 166/62   Pulse 85   Ht '5\' 3"'  (1.6 m)   Wt 111 lb (50.3 kg)   BMI 19.66 kg/m  Gen:  WD/WN, NAD patient comes into the office walking with minimal assistance. Skin: incision C/D/I; there is essentially no ecchymoses.  There is a typical healing ridge noted there is no puffiness edema or swelling of the left neck     Assessment/Plan:  1. Carotid stenosis, symptomatic, with infarction [facility]) Patient is status post successful left CEA.  The sutures are removed without difficulty.  She will return for follow-up in approximately 6 to 8 weeks at which time her postoperative ultrasound will be obtained.  - VAS US CAROTID; Future       Hortencia Pilar 11/25/2020, 8:43 AM   This note was created with Dragon medical transcription system.  Any errors from dictation are unintentional.

## 2020-11-26 ENCOUNTER — Ambulatory Visit: Payer: Medicare HMO | Admitting: Occupational Therapy

## 2020-11-26 ENCOUNTER — Ambulatory Visit: Payer: Medicare HMO | Admitting: Physical Therapy

## 2020-11-27 ENCOUNTER — Encounter: Payer: Medicare HMO | Admitting: Physical Medicine & Rehabilitation

## 2020-11-28 ENCOUNTER — Ambulatory Visit: Payer: Medicare HMO

## 2020-11-29 ENCOUNTER — Other Ambulatory Visit: Payer: Self-pay | Admitting: Physical Medicine and Rehabilitation

## 2020-12-01 ENCOUNTER — Ambulatory Visit: Payer: Medicare HMO | Admitting: Occupational Therapy

## 2020-12-01 ENCOUNTER — Ambulatory Visit: Payer: Medicare HMO | Admitting: Physical Therapy

## 2020-12-02 ENCOUNTER — Other Ambulatory Visit: Payer: Self-pay

## 2020-12-02 ENCOUNTER — Ambulatory Visit: Payer: Medicare HMO | Attending: Internal Medicine | Admitting: Physical Therapy

## 2020-12-02 ENCOUNTER — Encounter: Payer: Self-pay | Admitting: Physical Therapy

## 2020-12-02 DIAGNOSIS — I639 Cerebral infarction, unspecified: Secondary | ICD-10-CM | POA: Insufficient documentation

## 2020-12-02 DIAGNOSIS — R2681 Unsteadiness on feet: Secondary | ICD-10-CM | POA: Diagnosis not present

## 2020-12-02 DIAGNOSIS — R471 Dysarthria and anarthria: Secondary | ICD-10-CM | POA: Diagnosis present

## 2020-12-02 DIAGNOSIS — M6281 Muscle weakness (generalized): Secondary | ICD-10-CM | POA: Diagnosis present

## 2020-12-02 DIAGNOSIS — R482 Apraxia: Secondary | ICD-10-CM | POA: Diagnosis present

## 2020-12-02 DIAGNOSIS — R2689 Other abnormalities of gait and mobility: Secondary | ICD-10-CM | POA: Diagnosis present

## 2020-12-02 DIAGNOSIS — R278 Other lack of coordination: Secondary | ICD-10-CM | POA: Diagnosis present

## 2020-12-02 DIAGNOSIS — R4701 Aphasia: Secondary | ICD-10-CM | POA: Diagnosis present

## 2020-12-02 NOTE — Patient Instructions (Signed)
Access Code: [email or web address] URL: [email or web address] Date: 12/02/2020 Prepared by: Zettie Pho  Exercises Seated March with Resistance - 1 x daily - 7 x weekly - 2 sets - 10 reps Seated Hip Abduction with Resistance - 1 x daily - 7 x weekly - 2 sets - 10 reps Seated Ankle Dorsiflexion with Resistance - 1 x daily - 7 x weekly - 2 sets - 10 reps Standing Heel Raise with Support - 1 x daily - 7 x weekly - 2 sets - 10 reps

## 2020-12-03 ENCOUNTER — Encounter: Payer: Medicare HMO | Admitting: Occupational Therapy

## 2020-12-03 ENCOUNTER — Ambulatory Visit: Payer: Medicare HMO | Admitting: Physical Therapy

## 2020-12-03 NOTE — Therapy (Signed)
[facility] MAIN [facility] SERVICES [address] [place], [place], [address] Phone: [phone]   Fax:  [phone]  Physical Therapy Evaluation  Patient Details  Name: Cassandra Schaefer MRN: [id number] Date of Birth: 23-Mar-1929 Referring Provider (PT): Dr. Dew/Dr. Marcello Fennel PCP   Encounter Date: 12/02/2020   PT End of Session - 12/03/20 0819     Visit Number 1    Number of Visits 17    Date for PT Re-Evaluation 01/28/21    Authorization Type Medicare    Authorization Time Period 12/02/20 start of care    PT Start Time 1102    PT Stop Time 1205    PT Time Calculation (min) 63 min    Equipment Utilized During Treatment Gait belt    Activity Tolerance Patient tolerated treatment well    Behavior During Therapy WFL for tasks assessed/performed             Past Medical History:  Diagnosis Date   Anemia    Cough    RESOLVING, NO FEVER   Diabetes mellitus without complication (HCC)    Gout    Hypertension    Hypothyroidism    PUD (peptic ulcer disease)    Stroke [facility])    Wears dentures    full upper, partial lower    Past Surgical History:  Procedure Laterality Date   AMPUTATION TOE Left 12/14/2017   Procedure: AMPUTATION TOE-4TH MPJ;  Surgeon: Gwyneth Revels, DPM;  Location: [facility];  Service: Podiatry;  Laterality: Left;  IVA LOCAL Diabetic - oral meds   APPENDECTOMY     CATARACT EXTRACTION W/PHACO Right 06/23/2015   Procedure: CATARACT EXTRACTION PHACO AND INTRAOCULAR LENS PLACEMENT (IOC);  Surgeon: Sallee Lange, MD;  Location: [facility];  Service: Ophthalmology;  Laterality: Right;  [place] 01:28 AP% 23.4 CDE 36.14 fluid pack lot # [id number]   CATARACT EXTRACTION W/PHACO Left 07/28/2015   Procedure: CATARACT EXTRACTION PHACO AND INTRAOCULAR LENS PLACEMENT (IOC);  Surgeon: Sallee Lange, MD;  Location: [facility] OR[facility];  Service: Ophthalmology;  Laterality: Left;  [place]    1:29.7 AP%  24.9 CDE   41.32 fluid casette lot #[id number] H exp  09/23/2016   COONOSCOPY     AND ENDOSCOPY   DILATION AND CURETTAGE OF UTERUS     ENDARTERECTOMY Left 11/13/2020   Procedure: Evacuation of left neck hematoma;  Surgeon: Renford Dills, MD;  Location: [facility] OR[facility];  Service: Vascular;  Laterality: Left;   ENDARTERECTOMY Left 11/13/2020   Procedure: ENDARTERECTOMY CAROTID;  Surgeon: Annice Needy, MD;  Location: [facility];  Service: Vascular;  Laterality: Left;   TONSILLECTOMY     TUBAL LIGATION      There were no vitals filed for this visit.    Subjective Assessment - 12/02/20 1108     Subjective "Is it normal to get so tired so quickly?"    Pertinent History 87 y.o. female with medical history significant for type 2 diabetes mellitus, essential hypertension, acquired hypothyroidism, hyperlipidemia, stage III chronic kidney disease reports increased right sided weakness on 10/19/20. She was diagnosed with left basal ganglia ischemic stroke. She was discharged to inpatient rehab for 10 days and then discharged home. Patient was evaluated by outpatient PT on 11/12/20. CVA was due to carotid stenosis. She underwent left carotid endartectomy on 7/21. Procedure went well however patient suffered from left cervical hematoma and had subsequent surgical procedure to stop the bleed and remove the hematoma on 11/13/20. They were concerned with her breathing and therefore  intubated her for 1 day, she was in ICU for 2 days and transferred to med/surge on 11/15/20. Patient was discharged home on 11/17/20 and is now returning to outpatient PT. She has had the stiches removed and is healing well. Denies any neck pain or stiffness. She lives with her daughter and has 24/7 caregiver support. She is still using RW for most ambulation. She is able to transfer to bedside commode well and is able to stand short time unsupported. She reports feeling very weak and fatigued. She reports she has been dragging her right foot more. She denies any numbness/tingling; She reports feeling  like her legs are wanting to do more but is hesitant because of fear of falling. She is mod I for self care morning routine. She does still receive help with showering. She has been working on increasing her activity but denies any formal exercise.    Limitations Standing;Walking    How long can you sit comfortably? NA    How long can you stand comfortably? 30-60 min with support    How long can you walk comfortably? about 100 feet with RW, still limited to short distances;    Diagnostic tests MRI shows left basal ganglia infarct 10/21/20    Patient Stated Goals "Improve balance and walking and not need RW, get back to independent."    Currently in Pain? No/denies    Multiple Pain Sites No                [facility] PT Assessment - 12/03/20 0001       Assessment   Medical Diagnosis L basal ganglia CVA. s/p cartoid endarectomy    Referring Provider (PT) Dr. Dew/Dr. Marcello FennelHande PCP    Onset Date/Surgical Date 10/19/20   11/14/20 was surgery date   Hand Dominance Right    Next MD Visit 12/31/20    Prior Therapy [name] in June 2022; Had outpatient PT evaluation 7/21, but then was admitted with carotid surgery;      Precautions   Precautions Fall      Restrictions   Weight Bearing Restrictions No   released to full activity per vascular surgeon     Balance Screen   Has the patient fallen in the past 6 months No    Has the patient had a decrease in activity level because of a fear of falling?  No    Is the patient reluctant to leave their home because of a fear of falling?  No      Home Environment   Additional Comments Lives in split level home, has 6 steps to negotiate to get to bedroom/bathroom, negotiates steps favoring right leg, one step at a time, forward with handrails;      Prior Function   Level of Independence Independent    Vocation Retired    [employer] retired Pension scheme managerspecial education teacher    Leisure play cards, dominoes, go to movies      Cognition   Overall Cognitive  Status Within Functional Limits for tasks assessed      Observation/Other Assessments   Focus on Therapeutic Outcomes (FOTO)  50%   as of 11/12/20     Sensation   Light Touch Appears Intact      Coordination   Gross Motor Movements are Fluid and Coordinated Yes    Heel Shin Test accurate BLE      Posture/Postural Control   Posture Comments WFL      AROM   Overall AROM Comments cervical is  WFL; BUE and BLE are WFL;      Strength   Right Hip Flexion 3+/5    Right Hip ABduction 4-/5    Right Hip ADduction 4-/5    Left Hip Flexion 4/5    Left Hip ABduction 4/5    Left Hip ADduction 4/5    Right Knee Flexion 4-/5    Right Knee Extension 4-/5    Left Knee Flexion 4/5    Left Knee Extension 4/5    Right Ankle Dorsiflexion 4-/5    Left Ankle Dorsiflexion 4/5      6 Minute Walk- Baseline   BP (mmHg) 126/44    HR (bpm) 84    02 Sat (%RA) 99 %      6 Minute walk- Post Test   BP (mmHg) 147/51      6 minute walk test results    Aerobic Endurance Distance Walked 212    Endurance additional comments with RW with 1 seated rest break; limited community ambulator      Standardized Balance Assessment   Standardized Balance Assessment Five Times Sit to Stand    Five times sit to stand comments  25.5 sec without pushing on arm rests (>15 sec indicates high risk for falls)    10 Meter Walk 0.27 m/s with RW, high risk for falls      High Level Balance   High Level Balance Comments Patient continues to have some unsteadiness in standing requiring CGA to min A with dynamic movement; She is able to stand unsupported with close supervision without sway                   TREATMENT: PT initiated HEP: Seated with red tband around BLE: -hip flexion march x5 reps bilaterally; -hip abduction/ER x5 reps -ankle DF RLE only x5 reps  Patient able to don/doff tband well. She did require min VCs for proper band placement with ankle DF  Standing heel raises with cues to use support for  balance safety;  Provided written HEP for adherence;      Objective measurements completed on examination: See above findings.               PT Education - 12/03/20 0819     Education Details recommendations/plan of care/HEP    Person(s) Educated Patient    Methods Explanation;Verbal cues    Comprehension Verbalized understanding;Returned demonstration;Verbal cues required;Need further instruction              PT Short Term Goals - 12/03/20 0827       PT SHORT TERM GOAL #1   Title Patient will be adherent to HEP at least 3x a week to improve functional strength and balance for better safety at home.    Time 4    Period Weeks    Status New    Target Date 12/31/20      PT SHORT TERM GOAL #2   Title Patient will be independent in transferring sit<>Stand without pushing on arm rests to improve ability to get up from chair.    Time 4    Period Weeks    Status New    Target Date 12/31/20               PT Long Term Goals - 12/03/20 0828       PT LONG TERM GOAL #1   Title Patient (> 7 years old) will complete five times sit to stand test in < 15 seconds indicating an  increased LE strength and improved balance.    Time 8    Period Weeks    Status New    Target Date 01/28/21      PT LONG TERM GOAL #2   Title Patient will increase six minute walk test distance to >400 feet for improve gait ability    Time 8    Period Weeks    Status New    Target Date 01/28/21      PT LONG TERM GOAL #3   Title Patient will increase 10 meter walk test to >1.68m/s as to improve gait speed for better community ambulation and to reduce fall risk.    Time 8    Period Weeks    Status New    Target Date 01/28/21      PT LONG TERM GOAL #4   Title Patient will be independent with ascend/descend 6 steps using single UE in step over step pattern without LOB.    Time 8    Period Weeks    Status New    Target Date 01/28/21      PT LONG TERM GOAL #5   Title Patient will  demonstrate an improved Berg Balance Score of >45/56 as to demonstrate improved balance with ADLs such as sitting/standing and transfer balance and reduced fall risk.    Time 8    Period Weeks    Status New    Target Date 01/28/21      PT LONG TERM GOAL #6   Title Patient will improve FOTO score to >60% to indicate improved functional mobility with ADLs.    Time 8    Period Weeks    Status New    Target Date 01/28/21                    Plan - 12/03/20 0820     Clinical Impression Statement 87 yo Female s/p left basal gangia CVA with right side weakness and imbalance. She is s/p left carotid endarectomy on 11/13/20 and was hospitalized for 4 days. Since procedure she has reported increased fatigue and weakness. She is trying to increase activity at home and is anxious to improve her walking and independence.  Patient was independent prior to CVA and was exercising at local senior center 2x a week. She lives with her daughter and has a supportive family. She does live in a split level home and has to negotiate steps to go to bedroom/bathroom. Patient is currently ambulating with RW requiring CGA for safety with ambulation. She is walking as a limited home ambulator with slower gait speed. She exhibits impaired balance with increased posterior lean and unsteadiness when walking. She exhibits decreased coordination with RLE which contributes to imbalance. Patient also exhibits weakness in BLE (R>L) with increased foot drag with prolonged ambulation. She would benefit from skilled PT Intervention to address balance/strength deficits and improve walking ability. She would benefit from additional balance assessment for better understanding of fall risk.    Personal Factors and Comorbidities Age;Comorbidity 3+;Fitness    Comorbidities HTN, CKD, fall risk, type 2 diabetes,    Examination-Activity Limitations Lift;Locomotion Level;Squat;Stairs;Stand;Toileting;Transfers;Bathing     Examination-Participation Restrictions Cleaning;Community Activity;Laundry;Meal Prep;Shop;Volunteer;Driving    Stability/Clinical Decision Making Stable/Uncomplicated    PT Frequency 2x / week    PT Duration 8 weeks    PT Treatment/Interventions Cryotherapy;Electrical Stimulation;Moist Heat;Gait training;Stair training;Functional mobility training;Therapeutic activities;Therapeutic exercise;Neuromuscular re-education;Balance training;Patient/family education;Orthotic Fit/Training;Energy conservation    PT Next Visit Plan further assess balance with Sharlene Motts  Balance test, do 5 times sit<>Stand    PT Home Exercise Plan Initiated, see patient instructions    Recommended Other Services Pt will be evaluated for OT/ST soon    Consulted and Agree with Plan of Care Patient             Patient will benefit from skilled therapeutic intervention in order to improve the following deficits and impairments:  Abnormal gait, Decreased balance, Decreased endurance, Decreased mobility, Difficulty walking, Cardiopulmonary status limiting activity, Decreased activity tolerance, Decreased coordination, Decreased strength  Visit Diagnosis: Unsteadiness on feet  Muscle weakness (generalized)  Other lack of coordination  Other abnormalities of gait and mobility     Problem List Patient Active Problem List   Diagnosis Date Noted   Carotid stenosis, symptomatic, with infarction (HCC) 11/13/2020   Gout flare 11/10/2020   UTI (urinary tract infection) 11/10/2020   Left carotid artery stenosis 11/10/2020   Chronic kidney disease 11/10/2020   Left basal ganglia embolic stroke (HCC) 10/24/2020   PAC (premature atrial contraction)    Pre-procedural cardiovascular examination    Ischemic stroke (HCC) 10/20/2020   TIA (transient ischemic attack) 10/19/2020   Right hemiparesis (HCC) 10/19/2020   Type 2 diabetes mellitus with stage 3 chronic kidney disease, without long-term current use of insulin (HCC)  08/24/2018   Hyperlipidemia, unspecified 07/19/2017   Hypertension 07/19/2017   PUD (peptic ulcer disease) 07/19/2017   Type 2 diabetes mellitus (HCC) 07/19/2017   Personal history of gout 08/12/2016   Anemia, unspecified 04/09/2016   Hypothyroidism, acquired 12/10/2015    Tyrah Broers PT, DPT 12/03/2020, 8:31 AM  [place] [facility] MAIN [facility] SERVICES [address] [place], [place], [address] Phone: [phone]   Fax:  [phone]  Name: Cassandra Schaefer MRN: [id number] Date of Birth: [birth year]

## 2020-12-04 ENCOUNTER — Ambulatory Visit: Payer: Medicare HMO | Admitting: Speech Pathology

## 2020-12-04 ENCOUNTER — Other Ambulatory Visit: Payer: Self-pay

## 2020-12-04 ENCOUNTER — Ambulatory Visit: Payer: Medicare HMO

## 2020-12-04 DIAGNOSIS — I639 Cerebral infarction, unspecified: Secondary | ICD-10-CM

## 2020-12-04 DIAGNOSIS — M6281 Muscle weakness (generalized): Secondary | ICD-10-CM

## 2020-12-04 DIAGNOSIS — R278 Other lack of coordination: Secondary | ICD-10-CM

## 2020-12-04 DIAGNOSIS — R2681 Unsteadiness on feet: Secondary | ICD-10-CM | POA: Diagnosis not present

## 2020-12-04 DIAGNOSIS — R471 Dysarthria and anarthria: Secondary | ICD-10-CM

## 2020-12-04 DIAGNOSIS — R482 Apraxia: Secondary | ICD-10-CM

## 2020-12-04 DIAGNOSIS — R4701 Aphasia: Secondary | ICD-10-CM

## 2020-12-04 NOTE — Therapy (Signed)
[facility] MAIN [facility] SERVICES [address] [place], [place], [address] Phone: [phone]   Fax:  [phone]  Occupational Therapy Treatment/Progress Note  (Pt evaluated on 11/10/2020 and is present today for her first return visit  D/t complications post endarterectomy procedure on 11/13/2020)  Patient Details  Name: Cassandra Schaefer MRN: [id number] Date of Birth: October 15, 1928 Referring Provider (OT): Dr. Barbette Reichmann   Encounter Date: 12/04/2020   OT End of Session - 12/04/20 1229     Visit Number 2    Number of Visits 24    Date for OT Re-Evaluation 02/01/21    Authorization Time Period Reporting period starting 11/10/2020    OT Start Time 1100    OT Stop Time 1150    OT Time Calculation (min) 50 min    Equipment Utilized During Treatment wc    Activity Tolerance Patient tolerated treatment well    Behavior During Therapy WFL for tasks assessed/performed             Past Medical History:  Diagnosis Date   Anemia    Cough    RESOLVING, NO FEVER   Diabetes mellitus without complication (HCC)    Gout    Hypertension    Hypothyroidism    PUD (peptic ulcer disease)    Stroke [facility])    Wears dentures    full upper, partial lower    Past Surgical History:  Procedure Laterality Date   AMPUTATION TOE Left 12/14/2017   Procedure: AMPUTATION TOE-4TH MPJ;  Surgeon: Gwyneth Revels, DPM;  Location: [facility];  Service: Podiatry;  Laterality: Left;  IVA LOCAL Diabetic - oral meds   APPENDECTOMY     CATARACT EXTRACTION W/PHACO Right 06/23/2015   Procedure: CATARACT EXTRACTION PHACO AND INTRAOCULAR LENS PLACEMENT (IOC);  Surgeon: Sallee Lange, MD;  Location: [facility] ORS;  Service: Ophthalmology;  Laterality: Right;  [place] 01:28 AP% 23.4 CDE 36.14 fluid pack lot # [id number]   CATARACT EXTRACTION W/PHACO Left 07/28/2015   Procedure: CATARACT EXTRACTION PHACO AND INTRAOCULAR LENS PLACEMENT (IOC);  Surgeon: Sallee Lange, MD;   Location: [facility] ORS;  Service: Ophthalmology;  Laterality: Left;  [place]    1:29.7 AP%  24.9 CDE   41.32 fluid casette lot #[id number] H exp 09/23/2016   COONOSCOPY     AND ENDOSCOPY   DILATION AND CURETTAGE OF UTERUS     ENDARTERECTOMY Left 11/13/2020   Procedure: Evacuation of left neck hematoma;  Surgeon: Renford Dills, MD;  Location: [facility] ORS;  Service: Vascular;  Laterality: Left;   ENDARTERECTOMY Left 11/13/2020   Procedure: ENDARTERECTOMY CAROTID;  Surgeon: Annice Needy, MD;  Location: [facility] ORS;  Service: Vascular;  Laterality: Left;   TONSILLECTOMY     TUBAL LIGATION      There were no vitals filed for this visit.   Subjective Assessment - 12/04/20 1227     Subjective  "I've been using my R arm to eat."    Pertinent History R ankle pain, gout, T2DM, HTN, CKDIII; R/L carotid endarectomy    Patient Stated Goals "I want to improve my aim when I'm using my R arm."    Currently in Pain? No/denies    Pain Score 0-No pain                [facility] OT Assessment - 12/04/20 0001       Coordination   Right 9 Hole Peg Test 1 min, 38 sec    Left 9 Hole Peg Test 1 min,  15 sec      AROM   Overall AROM Comments cervical is WFL; BUE and BLE are WFL;      Hand Function   Right Hand Grip (lbs) 21    Right Hand Lateral Pinch 12 lbs    Right Hand 3 Point Pinch 8 lbs    Left Hand Grip (lbs) 25    Left Hand Lateral Pinch 15 lbs    Left 3 point pinch 8 lbs            Occupational Therapy Treatment/Progress Note: Therapeutic activity: Completed reassessment of BUE objective measures and ADL screen as pt is post op carotid endarterectomy with subsequence ICU stay 6 days, 7 days total in hospital.  See above for objective measures.   Neuro re-ed: Participated in R FMC/GMC activity picking up washers from dish and placing over vertical dowels with RUE.  Frequent rest breaks and extra time required.  Practiced picking up washers from table, keeping them in hand, and discarding them 1 at a  time from palm of hand.  Pt had difficulty picking them up from the table, resulting in sliding washers to the edge of table for easier pick up.  Issued green theraputty and reviewed exercises; handout given.  Pt completed gross grasping, pinching, digit abd/add, key and 3 point pinch, and digging small washers out of putty.  Pt to complete at home daily.   Response to treatment: Pt was evaluated by this OT on 11/10/2020 and underwent a L carotid endarterectomy on 11/13/2020.  Pt had complications and returned to sx same day for evacuation of hematoma on L side of neck.  Pt was sent to the ICU for 6 days, and discharged from the regular floor on the 7th day.  She returns to therapy today with reported slight decrease in overall strength, feeling tired more easily, but overall she reports improved coordination throughout RUE.  Pt still presents with grossly equal strength in bilat shoulders and elbows at 4/5.  No significant changes in hand strength bilaterally, but noted improvement by nearly 1 min in 9 hole peg test on R hand as compared to initial eval.  Pt reports she's now using RUE for self feeding.  Pt remains motivated and eager to return to her PLOF, and will benefit from skilled OT for increasing GMC/FMC throughout RUE in order to meet her ADL/IADL/leisure goals as pt .  Pt wants to return to playing dominoes and cards with her friends.    OT Education - 12/04/20 1228     Education Details HEP for coin manipulation and theraputty exercises    Person(s) Educated Patient    Methods Explanation;Verbal cues;Demonstration    Comprehension Verbalized understanding;Need further instruction              OT Short Term Goals - 12/04/20 1254       OT SHORT TERM GOAL #1   Title Pt will perform HEP for RUE strength/coordination independently.    Baseline Eval: HEP not yet established in outpatient, but pt is working with putty from [name]; 12/04/2020; [facility] HEP initiated this day with mod vc after return  demo.    Time 6    Period Weeks    Status On-going    Target Date 12/21/20               OT Long Term Goals - 12/04/20 1255       OT LONG TERM GOAL #1   Title Pt will improve hand writing  to 100% legibility with R hand to be able to independently write a check.    Baseline Eval: signature is 75% legible with R hand, requiring extra time; 12/04/2020: no change from eval    Time 12    Period Weeks    Status On-going    Target Date 02/01/21      OT LONG TERM GOAL #2   Title Pt will improve R hand coordination to enable good manipulation of iphone using R dominant hand    Baseline Eval: Pt uses L hand d/t R hand lacking sufficient FMC to press buttons on phone; 12/04/2020: no change from eval    Time 12    Period Weeks    Status On-going    Target Date 02/01/21      OT LONG TERM GOAL #3   Title Pt will improve GMC throughout RUE to enable pt to reach for and pick up ADL supplies with R dominant hand without dropping or knocking over objects.    Baseline Eval: Pt reports RUE is clumsy and easily knocks over objects when trying to reach for ADL supplies.  Pt verbalizes that her "aim is off."; 12/04/2020: pt reports slight improvement since eval and has started using her R hand to eat, but still feels clumsy.    Time 12    Period Weeks    Status On-going    Target Date 02/01/21      OT LONG TERM GOAL #5   Title Pt will perform dynamic standing tasks with modified indep, AD as needed in order to reduce fall risk with ADLs    Baseline Eval: CGA-min A for all dynamic standing tasks using RW; 12/04/2020: pt is using RW in home with distant supv, but family still provides close supv on stairs    Time 12    Period Weeks    Status On-going    Target Date 02/01/21                   Plan - 12/04/20 1254     Clinical Impression Statement Pt was evaluated by this OT on 11/10/2020 and underwent a L carotid endarterectomy on 11/13/2020.  Pt had complications and returned to sx same  day for evacuation of hematoma on L side of neck.  Pt was sent to the ICU for 6 days, and discharged from the regular floor on the 7th day.  She returns to therapy today with reported slight decrease in overall strength, feeling tired more easily, but overall she reports improved coordination throughout RUE.  Pt still presents with grossly equal strength in bilat shoulders and elbows at 4/5.  No significant changes in hand strength bilaterally, but noted improvement by nearly 1 min in 9 hole peg test on R hand as compared to initial eval.  Pt reports she's now using RUE for self feeding.  Pt remains motivated and eager to return to her PLOF, and will benefit from skilled OT for increasing GMC/FMC throughout RUE in order to meet her ADL/IADL/leisure goals.  Pt wants to return to playing dominoes and cards with her friends.    OT Occupational Profile and History Detailed Assessment- Review of Records and additional review of physical, cognitive, psychosocial history related to current functional performance    Occupational performance deficits (Please refer to evaluation for details): ADL's;Leisure;IADL's    Body Structure / Function / Physical Skills ADL;Coordination;Endurance;GMC;UE functional use;Balance;Sensation;Body mechanics;IADL;Pain;Dexterity;FMC;Strength;Gait;Mobility    Rehab Potential Good    Clinical Decision Making Several treatment options,  min-mod task modification necessary    Comorbidities Affecting Occupational Performance: May have comorbidities impacting occupational performance    Modification or Assistance to Complete Evaluation  Min-Moderate modification of tasks or assist with assess necessary to complete eval    OT Frequency 2x / week    OT Duration 12 weeks    OT Treatment/Interventions Self-care/ADL training;Therapeutic exercise;DME and/or AE instruction;Functional Mobility Training;Balance training;Neuromuscular education;Therapeutic activities;Patient/family education     Consulted and Agree with Plan of Care Patient             Patient will benefit from skilled therapeutic intervention in order to improve the following deficits and impairments:   Body Structure / Function / Physical Skills: ADL, Coordination, Endurance, GMC, UE functional use, Balance, Sensation, Body mechanics, IADL, Pain, Dexterity, FMC, Strength, Gait, Mobility       Visit Diagnosis: Muscle weakness (generalized)  Other lack of coordination  Apraxia    Problem List Patient Active Problem List   Diagnosis Date Noted   Carotid stenosis, symptomatic, with infarction (HCC) 11/13/2020   Gout flare 11/10/2020   UTI (urinary tract infection) 11/10/2020   Left carotid artery stenosis 11/10/2020   Chronic kidney disease 11/10/2020   Left basal ganglia embolic stroke (HCC) 10/24/2020   PAC (premature atrial contraction)    Pre-procedural cardiovascular examination    Ischemic stroke (HCC) 10/20/2020   TIA (transient ischemic attack) 10/19/2020   Right hemiparesis (HCC) 10/19/2020   Type 2 diabetes mellitus with stage 3 chronic kidney disease, without long-term current use of insulin (HCC) 08/24/2018   Hyperlipidemia, unspecified 07/19/2017   Hypertension 07/19/2017   PUD (peptic ulcer disease) 07/19/2017   Type 2 diabetes mellitus (HCC) 07/19/2017   Personal history of gout 08/12/2016   Anemia, unspecified 04/09/2016   Hypothyroidism, acquired 12/10/2015   Danelle Earthly, MS, OTR/L  Otis Dials 12/04/2020, 1:00 PM  [facility] [place] [facility] MAIN [facility] SERVICES [address] [place], [place], [address] Phone: [phone]   Fax:  [phone]  Name: Cassandra Schaefer MRN: [id number] Date of Birth: 12/23/1928

## 2020-12-05 NOTE — Therapy (Signed)
[place] [facility] MAIN [facility] SERVICES [address] [place], [place], [address] Phone: [phone]   Fax:  [phone]  Speech Language Pathology Evaluation  Patient Details  Name: Cassandra Schaefer MRN: [id number] Date of Birth: 1928/06/03 Referring Provider (SLP): Barbette Reichmann   Encounter Date: 12/04/2020   End of Session - 12/05/20 1538     Visit Number 1    Number of Visits 25    Date for SLP Re-Evaluation 02/27/21    Authorization Type [employer]    Authorization Time Period 12/04/2020 thru 02/27/2021    Authorization - Visit Number 1    Progress Note Due on Visit 10    SLP Start Time 1000    SLP Stop Time  1100    SLP Time Calculation (min) 60 min    Activity Tolerance Patient tolerated treatment well             Past Medical History:  Diagnosis Date   Anemia    Cough    RESOLVING, NO FEVER   Diabetes mellitus without complication (HCC)    Gout    Hypertension    Hypothyroidism    PUD (peptic ulcer disease)    Stroke [facility])    Wears dentures    full upper, partial lower    Past Surgical History:  Procedure Laterality Date   AMPUTATION TOE Left 12/14/2017   Procedure: AMPUTATION TOE-4TH MPJ;  Surgeon: Gwyneth Revels, DPM;  Location: [facility];  Service: Podiatry;  Laterality: Left;  IVA LOCAL Diabetic - oral meds   APPENDECTOMY     CATARACT EXTRACTION W/PHACO Right 06/23/2015   Procedure: CATARACT EXTRACTION PHACO AND INTRAOCULAR LENS PLACEMENT (IOC);  Surgeon: Sallee Lange, MD;  Location: [facility] ORS;  Service: Ophthalmology;  Laterality: Right;  [place] 01:28 AP% 23.4 CDE 36.14 fluid pack lot # [id number]   CATARACT EXTRACTION W/PHACO Left 07/28/2015   Procedure: CATARACT EXTRACTION PHACO AND INTRAOCULAR LENS PLACEMENT (IOC);  Surgeon: Sallee Lange, MD;  Location: [facility] ORS;  Service: Ophthalmology;  Laterality: Left;  [place]    1:29.7 AP%  24.9 CDE   41.32 fluid casette lot #[id number] H exp 09/23/2016   COONOSCOPY      AND ENDOSCOPY   DILATION AND CURETTAGE OF UTERUS     ENDARTERECTOMY Left 11/13/2020   Procedure: Evacuation of left neck hematoma;  Surgeon: Renford Dills, MD;  Location: [facility] ORS;  Service: Vascular;  Laterality: Left;   ENDARTERECTOMY Left 11/13/2020   Procedure: ENDARTERECTOMY CAROTID;  Surgeon: Annice Needy, MD;  Location: [facility] ORS;  Service: Vascular;  Laterality: Left;   TONSILLECTOMY     TUBAL LIGATION      There were no vitals filed for this visit.   Subjective Assessment - 12/05/20 1534     Subjective "I am feeling really good"    Currently in Pain? No/denies                SLP Evaluation [facility] - 12/05/20 1534       SLP Visit Information   SLP Received On 12/04/20    Referring Provider (SLP) Barbette Reichmann    Onset Date 10/19/2020    Medical Diagnosis CVA      General Information   HPI 87 y.o. female with medical history significant for type 2 diabetes mellitus, essential hypertension, acquired hypothyroidism, hyperlipidemia, stage III chronic kidney disease with baseline creatinine 1.4-1.7, who is admitted to [facility] on 10/19/2020 with suspected TIA versus acute ischemic CVA after presenting  [facility] ED complaining of right-sided weakness. Confirmed L basal ganglia ischemic stroke. 10/21/2020 MRI confirms extension in CVA from 5mm to 15mm. Pt had enderectomy on 11/14/2020.    Behavioral/Cognition appropriate    Mobility Status arrived in [employer] chair      Balance Screen   Has the patient fallen in the past 6 months No      Prior Functional Status   Cognitive/Linguistic Baseline Within functional limits    Type of Home House     Lives With Daughter    Available Support Family      Pain Assessment   Pain Assessment No/denies pain      Cognition   Overall Cognitive Status Within Functional Limits for tasks assessed      Auditory Comprehension   Overall Auditory Comprehension Appears within functional limits for  tasks assessed      Visual Recognition/Discrimination   Discrimination Within Function Limits      Reading Comprehension   Reading Status Within funtional limits      Expression   Primary Mode of Expression Verbal      Verbal Expression   Overall Verbal Expression Impaired    Initiation No impairment    Automatic Speech Social Response    Level of Generative/Spontaneous Verbalization Conversation    Repetition No impairment    Naming Impairment    Responsive 76-100% accurate    Confrontation 75-100% accurate    Convergent 75-100% accurate    Divergent 75-100% accurate    Other Naming Comments rare instances of word finding difficulty in which patient is able to repair with mod I-to-sup A using description strategy, gestures, additional time    Pragmatics No impairment    Interfering Components Speech intelligibility    Non-Verbal Means of Communication Not applicable      Written Expression   Dominant Hand Right    Written Expression Unable to assess (comment)   d/t RUE impairment     Oral Motor/Sensory Function   Overall Oral Motor/Sensory Function Impaired      Motor Speech   Overall Motor Speech Impaired    Respiration Within functional limits    Level of Impairment Conversation    Phonation Normal    Resonance Within functional limits    Articulation Impaired    Level of Impairment Conversation    Intelligibility Intelligibility reduced    Word 75-100% accurate    Phrase 75-100% accurate    Sentence 75-100% accurate    Conversation 75-100% accurate    Motor Planning Witnin functional limits    Motor Speech Errors Not applicable                             SLP Education - 12/05/20 1538     Education Details results of assessment, ST POC    Person(s) Educated Patient    Methods Explanation    Comprehension Verbalized understanding;Returned demonstration;Need further instruction              SLP Short Term Goals - 12/05/20 1542        SLP SHORT TERM GOAL #1   Title Pt will list 10 items within semi-complex category over 3 consecutive sessions.    Baseline new goal    Time 10   sessions   Status New      SLP SHORT TERM GOAL #2   Title With supervision cues, pt will utilize speech intelligibility strategies at the connected sentence  level to achieve > 95% speech intelligibility.    Baseline new goal    Time 10    Period --   sessions   Status New      SLP SHORT TERM GOAL #3   Title Pt will read at the paragraph level with > 75% speech intelligibility.    Baseline new goal    Time 10    Period --   session   Status New              SLP Long Term Goals - 12/05/20 1544       SLP LONG TERM GOAL #1   Title Pt will utilize word finding strategies to effectively communicate abstract/coomplex ideas/information.    Baseline new goal    Time 12    Period Weeks    Status New    Target Date 02/27/21      SLP LONG TERM GOAL #2   Title Pt will speech intelligibility strategies to achieve > 95% speech intelligibility during complex conversation.    Baseline new goal    Period Weeks    Status New    Target Date 02/27/21              Plan - 12/05/20 1541     Clinical Impression Statement Pt is post op carotid endarterectomy with subsequence ICU stay 6 days, 7 days total in hospital.  Pt continues to present with mild expressive aphasia that is c/b higher level word finding deficits when converying complex/abstract ideas/information. She also demonstrates slightly worsened dysarthria that is c/b fast rate of speech, decreased respiratory support and imprecise articulation. This results in decreased speech intelligibility at the simple conversation level of ~ 70%. Prior to CVA, pt was active within her church, community and was independent with her communication abilities. Skilled ST intervention is required to target the above mentioned deficits in order to increase pt's functional independence,  participation in community and leisure activities as well as reduce caregiver burden.    Speech Therapy Frequency 2x / week    Duration 12 weeks    Treatment/Interventions Language facilitation;Functional tasks;SLP instruction and feedback;Patient/family education    Potential to Achieve Goals Good    Consulted and Agree with Plan of Care Patient             Patient will benefit from skilled therapeutic intervention in order to improve the following deficits and impairments:   Aphasia  Dysarthria and anarthria  Left basal ganglia embolic stroke [facility])    Problem List Patient Active Problem List   Diagnosis Date Noted   Carotid stenosis, symptomatic, with infarction (HCC) 11/13/2020   Gout flare 11/10/2020   UTI (urinary tract infection) 11/10/2020   Left carotid artery stenosis 11/10/2020   Chronic kidney disease 11/10/2020   Left basal ganglia embolic stroke (HCC) 10/24/2020   PAC (premature atrial contraction)    Pre-procedural cardiovascular examination    Ischemic stroke (HCC) 10/20/2020   TIA (transient ischemic attack) 10/19/2020   Right hemiparesis (HCC) 10/19/2020   Type 2 diabetes mellitus with stage 3 chronic kidney disease, without long-term current use of insulin (HCC) 08/24/2018   Hyperlipidemia, unspecified 07/19/2017   Hypertension 07/19/2017   PUD (peptic ulcer disease) 07/19/2017   Type 2 diabetes mellitus (HCC) 07/19/2017   Personal history of gout 08/12/2016   Anemia, unspecified 04/09/2016   Hypothyroidism, acquired 12/10/2015   Chanel Mcadams B. Dreama Saa M.S., CCC-SLP, [facility] Speech-Language Pathologist Rehabilitation Services Office [phone]  [name] 12/05/2020, 3:45 PM  [place] [facility] MAIN [facility] SERVICES [address] [place], [place], [address] Phone: [phone]   Fax:  [phone]  Name: Cassandra Schaefer MRN: [id number] Date of Birth: 01-12-1929

## 2020-12-08 ENCOUNTER — Ambulatory Visit: Payer: Medicare HMO

## 2020-12-08 ENCOUNTER — Ambulatory Visit: Payer: Medicare HMO | Admitting: Speech Pathology

## 2020-12-09 ENCOUNTER — Ambulatory Visit: Payer: Medicare HMO

## 2020-12-09 ENCOUNTER — Ambulatory Visit: Payer: Medicare HMO | Admitting: Speech Pathology

## 2020-12-09 ENCOUNTER — Encounter: Payer: Medicare HMO | Admitting: Occupational Therapy

## 2020-12-09 ENCOUNTER — Ambulatory Visit: Payer: Medicare HMO | Admitting: Physical Therapy

## 2020-12-09 ENCOUNTER — Other Ambulatory Visit: Payer: Self-pay

## 2020-12-09 DIAGNOSIS — R471 Dysarthria and anarthria: Secondary | ICD-10-CM

## 2020-12-09 DIAGNOSIS — I639 Cerebral infarction, unspecified: Secondary | ICD-10-CM

## 2020-12-09 DIAGNOSIS — R482 Apraxia: Secondary | ICD-10-CM

## 2020-12-09 DIAGNOSIS — R278 Other lack of coordination: Secondary | ICD-10-CM

## 2020-12-09 DIAGNOSIS — M6281 Muscle weakness (generalized): Secondary | ICD-10-CM

## 2020-12-09 DIAGNOSIS — R2681 Unsteadiness on feet: Secondary | ICD-10-CM | POA: Diagnosis not present

## 2020-12-09 DIAGNOSIS — R2689 Other abnormalities of gait and mobility: Secondary | ICD-10-CM

## 2020-12-09 DIAGNOSIS — R4701 Aphasia: Secondary | ICD-10-CM

## 2020-12-09 NOTE — Patient Instructions (Addendum)
Access Code: [email or web address] URL: [email or web address] Date: 12/09/2020 Prepared by: Zettie Pho  Exercises Forward Step Up with Counter Support - 1 x daily - 4 x weekly - 1 sets - 10 reps

## 2020-12-09 NOTE — Therapy (Signed)
[place] [facility] MAIN [facility] SERVICES [address] [place], [place], [address] Phone: [phone]   Fax:  [phone]  Occupational Therapy Treatment  Patient Details  Name: Cassandra Schaefer MRN: [id number] Date of Birth: [birth year] Referring Provider (OT): Dr. Barbette Reichmann   Encounter Date: 12/09/2020   OT End of Session - 12/09/20 1218     Visit Number 3    Number of Visits 24    Date for OT Re-Evaluation 02/01/21    Authorization Time Period Reporting period starting 11/10/2020    OT Start Time 1158    OT Stop Time 1230    OT Time Calculation (min) 32 min    Equipment Utilized During Treatment wc    Activity Tolerance Patient tolerated treatment well    Behavior During Therapy WFL for tasks assessed/performed             Past Medical History:  Diagnosis Date   Anemia    Cough    RESOLVING, NO FEVER   Diabetes mellitus without complication (HCC)    Gout    Hypertension    Hypothyroidism    PUD (peptic ulcer disease)    Stroke [facility])    Wears dentures    full upper, partial lower    Past Surgical History:  Procedure Laterality Date   AMPUTATION TOE Left 12/14/2017   Procedure: AMPUTATION TOE-4TH MPJ;  Surgeon: Gwyneth Revels, DPM;  Location: [facility];  Service: Podiatry;  Laterality: Left;  IVA LOCAL Diabetic - oral meds   APPENDECTOMY     CATARACT EXTRACTION W/PHACO Right 06/23/2015   Procedure: CATARACT EXTRACTION PHACO AND INTRAOCULAR LENS PLACEMENT (IOC);  Surgeon: Sallee Lange, MD;  Location: [facility] OR[facility];  Service: Ophthalmology;  Laterality: Right;  [place] 01:28 AP% 23.4 CDE 36.14 fluid pack lot # [id number]   CATARACT EXTRACTION W/PHACO Left 07/28/2015   Procedure: CATARACT EXTRACTION PHACO AND INTRAOCULAR LENS PLACEMENT (IOC);  Surgeon: Sallee Lange, MD;  Location: [facility] ORS;  Service: Ophthalmology;  Laterality: Left;  [place]    1:29.7 AP%  24.9 CDE   41.32 fluid casette lot #[id number] H exp 09/23/2016    COONOSCOPY     AND ENDOSCOPY   DILATION AND CURETTAGE OF UTERUS     ENDARTERECTOMY Left 11/13/2020   Procedure: Evacuation of left neck hematoma;  Surgeon: Renford Dills, MD;  Location: [facility] ORS;  Service: Vascular;  Laterality: Left;   ENDARTERECTOMY Left 11/13/2020   Procedure: ENDARTERECTOMY CAROTID;  Surgeon: Annice Needy, MD;  Location: [facility] ORS;  Service: Vascular;  Laterality: Left;   TONSILLECTOMY     TUBAL LIGATION      There were no vitals filed for this visit.   Subjective Assessment - 12/09/20 1216     Subjective  "When I use my R hand a lot it tingles."    Pertinent History R ankle pain, gout, T2DM, HTN, CKDIII; R/L carotid endarectomy    Patient Stated Goals "I want to improve my aim when I'm using my R arm."    Currently in Pain? No/denies    Pain Score 0-No pain    Multiple Pain Sites No           Occupational Therapy Treatment: Neuro re-ed: Participation in [facility] training for R hand, using Jamar peg board.  Pt had difficulty picking up thin pegs from smooth surface; transitioned to non skid matting and pt was able to pick up with greater ease.  Pt intermittently required use of L hand to reposition peg  in R hand before placing into peg hole.  Removed pegs from board with tweezers, requiring vc for adjusting grip on tweezers.    Self Care: Participation in handwriting exercises, tracing curved and jagged lines, drawing circles, diagonal lines on lined paper, copying sentence from a page with print and cursive writing, and printing then signing name.  Provided pt with handwriting exercise handout to bring home.  Encouraged pt chose 1-2 exercises daily.  Pt agreed.   Response to Treatment: Pt remains very motivated and is progressing well towards OT goals.  Pt reports that she sets aside time for her OT/PT/SLP exercises throughout the day, allowing rest between.  RUE gross and FMC remain impaired, limiting efficiency and indep with ADLs.  Pt will benefit from  continued skilled OT to address these deficits for maximizing indep with self care.         OT Education - 12/09/20 1217     Education Details handwriting exercises; allow frequent rest breaks with activity    Person(s) Educated Patient    Methods Explanation;Verbal cues;Demonstration    Comprehension Verbalized understanding;Need further instruction              OT Short Term Goals - 12/04/20 1254       OT SHORT TERM GOAL #1   Title Pt will perform HEP for RUE strength/coordination independently.    Baseline Eval: HEP not yet established in outpatient, but pt is working with putty from [name]; 12/04/2020; [facility] HEP initiated this day with mod vc after return demo.    Time 6    Period Weeks    Status On-going    Target Date 12/21/20               OT Long Term Goals - 12/04/20 1255       OT LONG TERM GOAL #1   Title Pt will improve hand writing to 100% legibility with R hand to be able to independently write a check.    Baseline Eval: signature is 75% legible with R hand, requiring extra time; 12/04/2020: no change from eval    Time 12    Period Weeks    Status On-going    Target Date 02/01/21      OT LONG TERM GOAL #2   Title Pt will improve R hand coordination to enable good manipulation of iphone using R dominant hand    Baseline Eval: Pt uses L hand d/t R hand lacking sufficient FMC to press buttons on phone; 12/04/2020: no change from eval    Time 12    Period Weeks    Status On-going    Target Date 02/01/21      OT LONG TERM GOAL #3   Title Pt will improve GMC throughout RUE to enable pt to reach for and pick up ADL supplies with R dominant hand without dropping or knocking over objects.    Baseline Eval: Pt reports RUE is clumsy and easily knocks over objects when trying to reach for ADL supplies.  Pt verbalizes that her "aim is off."; 12/04/2020: pt reports slight improvement since eval and has started using her R hand to eat, but still feels clumsy.    Time  12    Period Weeks    Status On-going    Target Date 02/01/21      OT LONG TERM GOAL #5   Title Pt will perform dynamic standing tasks with modified indep, AD as needed in order to reduce fall risk with ADLs  Baseline Eval: CGA-min A for all dynamic standing tasks using RW; 12/04/2020: pt is using RW in home with distant supv, but family still provides close supv on stairs    Time 12    Period Weeks    Status On-going    Target Date 02/01/21                   Plan - 12/09/20 1705     Clinical Impression Statement Pt remains very motivated and is progressing well towards OT goals.  Pt reports that she sets aside time for her OT/PT/SLP exercises throughout the day, allowing rest between.  RUE gross and FMC remain impaired, limiting efficiency and indep with ADLs.  Pt will benefit from continued skilled OT to address these deficits for maximizing indep with self care.    OT Occupational Profile and History Detailed Assessment- Review of Records and additional review of physical, cognitive, psychosocial history related to current functional performance    Occupational performance deficits (Please refer to evaluation for details): ADL's;Leisure;IADL's    Body Structure / Function / Physical Skills ADL;Coordination;Endurance;GMC;UE functional use;Balance;Sensation;Body mechanics;IADL;Pain;Dexterity;FMC;Strength;Gait;Mobility    Rehab Potential Good    Clinical Decision Making Several treatment options, min-mod task modification necessary    Comorbidities Affecting Occupational Performance: May have comorbidities impacting occupational performance    Modification or Assistance to Complete Evaluation  Min-Moderate modification of tasks or assist with assess necessary to complete eval    OT Frequency 2x / week    OT Duration 12 weeks    OT Treatment/Interventions Self-care/ADL training;Therapeutic exercise;DME and/or AE instruction;Functional Mobility Training;Balance  training;Neuromuscular education;Therapeutic activities;Patient/family education    Consulted and Agree with Plan of Care Patient             Patient will benefit from skilled therapeutic intervention in order to improve the following deficits and impairments:   Body Structure / Function / Physical Skills: ADL, Coordination, Endurance, GMC, UE functional use, Balance, Sensation, Body mechanics, IADL, Pain, Dexterity, FMC, Strength, Gait, Mobility       Visit Diagnosis: Muscle weakness (generalized)  Other lack of coordination  Apraxia    Problem List Patient Active Problem List   Diagnosis Date Noted   Carotid stenosis, symptomatic, with infarction (HCC) 11/13/2020   Gout flare 11/10/2020   UTI (urinary tract infection) 11/10/2020   Left carotid artery stenosis 11/10/2020   Chronic kidney disease 11/10/2020   Left basal ganglia embolic stroke (HCC) 10/24/2020   PAC (premature atrial contraction)    Pre-procedural cardiovascular examination    Ischemic stroke (HCC) 10/20/2020   TIA (transient ischemic attack) 10/19/2020   Right hemiparesis (HCC) 10/19/2020   Type 2 diabetes mellitus with stage 3 chronic kidney disease, without long-term current use of insulin (HCC) 08/24/2018   Hyperlipidemia, unspecified 07/19/2017   Hypertension 07/19/2017   PUD (peptic ulcer disease) 07/19/2017   Type 2 diabetes mellitus (HCC) 07/19/2017   Personal history of gout 08/12/2016   Anemia, unspecified 04/09/2016   Hypothyroidism, acquired 12/10/2015   Danelle Earthly, MS, OTR/L  Otis Dials 12/09/2020, 5:05 PM  [facility] MAIN [facility] SERVICES [address] [place], [place], [address] Phone: [phone]   Fax:  [phone]  Name: Cassandra Schaefer MRN: [id number] Date of Birth: November 22, 1928

## 2020-12-09 NOTE — Therapy (Signed)
[place] [facility] MAIN [facility] SERVICES [address] [place], [place], [address] Phone: [phone]   Fax:  [phone]  Physical Therapy Treatment  Patient Details  Name: Cassandra Schaefer MRN: [id number] Date of Birth: 1929-02-22 Referring Provider (PT): Dr. Dew/Dr. Marcello Fennel PCP   Encounter Date: 12/09/2020   PT End of Session - 12/09/20 1239     Visit Number 2    Number of Visits 17    Date for PT Re-Evaluation 01/28/21    Authorization Type Medicare    Authorization Time Period 12/02/20 start of care    PT Start Time 1300    PT Stop Time 1345    PT Time Calculation (min) 45 min    Equipment Utilized During Treatment Gait belt    Activity Tolerance Patient tolerated treatment well    Behavior During Therapy WFL for tasks assessed/performed             Past Medical History:  Diagnosis Date   Anemia    Cough    RESOLVING, NO FEVER   Diabetes mellitus without complication (HCC)    Gout    Hypertension    Hypothyroidism    PUD (peptic ulcer disease)    Stroke [facility])    Wears dentures    full upper, partial lower    Past Surgical History:  Procedure Laterality Date   AMPUTATION TOE Left 12/14/2017   Procedure: AMPUTATION TOE-4TH MPJ;  Surgeon: Gwyneth Revels, DPM;  [facility];  Service: Podiatry;  Laterality: Left;  IVA LOCAL Diabetic - oral meds   APPENDECTOMY     CATARACT EXTRACTION W/PHACO Right 06/23/2015   Procedure: CATARACT EXTRACTION PHACO AND INTRAOCULAR LENS PLACEMENT (IOC);  Surgeon: Sallee Lange, MD;  Location: [facility] ORS;  Service: Ophthalmology;  Laterality: Right;  [place] 01:28 AP% 23.4 CDE 36.14 fluid pack lot # [id number]   CATARACT EXTRACTION W/PHACO Left 07/28/2015   Procedure: CATARACT EXTRACTION PHACO AND INTRAOCULAR LENS PLACEMENT (IOC);  Surgeon: Sallee Lange, MD;  Location: [facility];  Service: Ophthalmology;  Laterality: Left;  [place]    1:29.7 AP%  24.9 CDE   41.32 fluid casette lot #[id number] H exp  09/23/2016   COONOSCOPY     AND ENDOSCOPY   DILATION AND CURETTAGE OF UTERUS     ENDARTERECTOMY Left 11/13/2020   Procedure: Evacuation of left neck hematoma;  Surgeon: Renford Dills, MD;  Location: [facility];  Service: Vascular;  Laterality: Left;   ENDARTERECTOMY Left 11/13/2020   Procedure: ENDARTERECTOMY CAROTID;  Surgeon: Annice Needy, MD;  Location: [facility] OR[facility];  Service: Vascular;  Laterality: Left;   TONSILLECTOMY     TUBAL LIGATION      There were no vitals filed for this visit.   Subjective Assessment - 12/09/20 1311     Subjective Patient reports doing well. No pain today. Reports adherence with HEP;    Pertinent History 87 y.o. female with medical history significant for type 2 diabetes mellitus, essential hypertension, acquired hypothyroidism, hyperlipidemia, stage III chronic kidney disease reports increased right sided weakness on 10/19/20. She was diagnosed with left basal ganglia ischemic stroke. She was discharged to inpatient rehab for 10 days and then discharged home. Patient was evaluated by outpatient PT on 11/12/20. CVA was due to carotid stenosis. She underwent left carotid endartectomy on 7/21. Procedure went well however patient suffered from left cervical hematoma and had subsequent surgical procedure to stop the bleed and remove the hematoma on 11/13/20. They were concerned with her breathing and  therefore intubated her for 1 day, she was in ICU for 2 days and transferred to med/surge on 11/15/20. Patient was discharged home on 11/17/20 and is now returning to outpatient PT. She has had the stiches removed and is healing well. Denies any neck pain or stiffness. She lives with her daughter and has 24/7 caregiver support. She is still using RW for most ambulation. She is able to transfer to bedside commode well and is able to stand short time unsupported. She reports feeling very weak and fatigued. She reports she has been dragging her right foot more. She denies any  numbness/tingling; She reports feeling like her legs are wanting to do more but is hesitant because of fear of falling. She is mod I for self care morning routine. She does still receive help with showering. She has been working on increasing her activity but denies any formal exercise.    Limitations Standing;Walking    How long can you sit comfortably? NA    How long can you stand comfortably? 30-60 min with support    How long can you walk comfortably? about 100 feet with RW, still limited to short distances;    Diagnostic tests MRI shows left basal ganglia infarct 10/21/20    Patient Stated Goals "Improve balance and walking and not need RW, get back to independent."    Currently in Pain? No/denies    Multiple Pain Sites No                [facility] PT Assessment - 12/09/20 0001       Standardized Balance Assessment   Standardized Balance Assessment Berg Balance Test    Five times sit to stand comments  15.71 sec with BUE on arm rests, unable to complete without pushing with UE due to LE weakness;      Berg Balance Test   Sit to Stand Able to stand  independently using hands    Standing Unsupported Able to stand 2 minutes with supervision    Sitting with Back Unsupported but Feet Supported on Floor or Stool Able to sit safely and securely 2 minutes    Stand to Sit Controls descent by using hands    Transfers Able to transfer safely, definite need of hands    Standing Unsupported with Eyes Closed Able to stand 3 seconds    Standing Unsupported with Feet Together Needs help to attain position but able to stand for 30 seconds with feet together    From Standing, Reach Forward with Outstretched Arm Can reach forward >12 cm safely (5")    From Standing Position, Pick up Object from Floor Unable to pick up shoe, but reaches 2-5 cm (1-2") from shoe and balances independently    From Standing Position, Turn to Look Behind Over each Shoulder Looks behind from both sides and weight shifts well     Turn 360 Degrees Needs assistance while turning    Standing Unsupported, Alternately Place Feet on Step/Stool Needs assistance to keep from falling or unable to try    Standing Unsupported, One Foot in Front Needs help to step but can hold 15 seconds    Standing on One Leg Tries to lift leg/unable to hold 3 seconds but remains standing independently    Total Score 30    Berg comment: <36 indicates high risk for falls             TREATMENT:  Standing in parallel bars: Standing on 1/2 bolster; -heel/toe rock x10 reps with  BUE rail assist -balance in neutral position 10 sec hold x2 reps -tandem stance unsupported 10 sec hold x2 reps each foot in front; Patient required min  A for safety. She had a harder time with left foot ahead of right foot with increased loss of balance to right side.   Instructed patient in Berg Balance Assessment and 5 times sit<>stand, see above;  Patient unable to transfer from chair without pushing on arm rests due to LE weakness;  Pt ascend/descend 4 steps with bilateral rail assist, forward non-reciprocal, favoring RLE; She does exhibit increased right foot drag and required CGA for safety as she exhibits some unsteadiness due to fatigue;  Instructed patient in forward step ups on 6 inch step with BUE rail assist x10 reps each LE with min VCs for sequencing and positioning;   Gait around gym with RW x40 feet x2 with CGA for safety with cues to increase erect posture and increased step length; She does exhibit increased right foot drag and loss of balance to right side with increased fatigue;  Instructed patient in LE strengthening: Seated with red tband around BLE: -hip flexion march x15 reps bilaterally with visual cues to increase ROM for better strengthening -Hip abduction/ER x15 reps with visual cues to increase ROM for better strengthening -ankle DF RLE only 2x15 reps with mod VCs for band positioning for optimal strengthening; Patient also  required min VCs to slow down motion for better motor control;  Patient tolerated session well. She does report increased fatigue at end of session. She tested as a high risk for falls. Patient does exhibit increased right foot drag and increased lateral loss of balance/unsteadiness with fatigue;  She required CGA to min A throughout session for safety;                      PT Education - 12/09/20 1311     Education Details balance assessment/HEP    Person(s) Educated Patient    Methods Explanation;Verbal cues    Comprehension Verbalized understanding;Returned demonstration;Verbal cues required;Need further instruction              PT Short Term Goals - 12/03/20 0827       PT SHORT TERM GOAL #1   Title Patient will be adherent to HEP at least 3x a week to improve functional strength and balance for better safety at home.    Time 4    Period Weeks    Status New    Target Date 12/31/20      PT SHORT TERM GOAL #2   Title Patient will be independent in transferring sit<>Stand without pushing on arm rests to improve ability to get up from chair.    Time 4    Period Weeks    Status New    Target Date 12/31/20               PT Long Term Goals - 12/03/20 0828       PT LONG TERM GOAL #1   Title Patient (> 21 years old) will complete five times sit to stand test in < 15 seconds indicating an increased LE strength and improved balance.    Time 8    Period Weeks    Status New    Target Date 01/28/21      PT LONG TERM GOAL #2   Title Patient will increase six minute walk test distance to >400 feet for improve gait ability    Time 8  Period Weeks    Status New    Target Date 01/28/21      PT LONG TERM GOAL #3   Title Patient will increase 10 meter walk test to >1.3917m/s as to improve gait speed for better community ambulation and to reduce fall risk.    Time 8    Period Weeks    Status New    Target Date 01/28/21      PT LONG TERM GOAL #4   Title  Patient will be independent with ascend/descend 6 steps using single UE in step over step pattern without LOB.    Time 8    Period Weeks    Status New    Target Date 01/28/21      PT LONG TERM GOAL #5   Title Patient will demonstrate an improved Berg Balance Score of >45/56 as to demonstrate improved balance with ADLs such as sitting/standing and transfer balance and reduced fall risk.    Time 8    Period Weeks    Status New    Target Date 01/28/21      PT LONG TERM GOAL #6   Title Patient will improve FOTO score to >60% to indicate improved functional mobility with ADLs.    Time 8    Period Weeks    Status New    Target Date 01/28/21                   Plan - 12/09/20 1329     Clinical Impression Statement Patient motivated and participated well within session. She does fatigue quickly. She has already had outpatient OT/ST prior to physical therapy which could be contributing to fatigue;She tested as a high risk for falls. Patient does exhibit increased right foot drag and increased lateral loss of balance/unsteadiness with fatigue;   Instructed patient in advanced balance/LE strengthening exercise. She does require CGA to min A throughout session for safety.Advanced HEP, see patient instructions;  She would benefit from additional skilled PT intervention to improve strength, balance and mobility;    Personal Factors and Comorbidities Age;Comorbidity 3+;Fitness    Comorbidities HTN, CKD, fall risk, type 2 diabetes,    Examination-Activity Limitations Lift;Locomotion Level;Squat;Stairs;Stand;Toileting;Transfers;Bathing    Examination-Participation Restrictions Cleaning;Community Activity;Laundry;Meal Prep;Shop;Volunteer;Driving    Stability/Clinical Decision Making Stable/Uncomplicated    PT Frequency 2x / week    PT Duration 8 weeks    PT Treatment/Interventions Cryotherapy;Electrical Stimulation;Moist Heat;Gait training;Stair training;Functional mobility training;Therapeutic  activities;Therapeutic exercise;Neuromuscular re-education;Balance training;Patient/family education;Orthotic Fit/Training;Energy conservation    PT Next Visit Plan work on LE strengthening and balance;    PT Home Exercise Plan Initiated, see patient instructions    Consulted and Agree with Plan of Care Patient             Patient will benefit from skilled therapeutic intervention in order to improve the following deficits and impairments:  Abnormal gait, Decreased balance, Decreased endurance, Decreased mobility, Difficulty walking, Cardiopulmonary status limiting activity, Decreased activity tolerance, Decreased coordination, Decreased strength  Visit Diagnosis: Muscle weakness (generalized)  Other lack of coordination  Apraxia  Aphasia  Dysarthria and anarthria  Left basal ganglia embolic stroke (HCC)  Unsteadiness on feet  Other abnormalities of gait and mobility     Problem List Patient Active Problem List   Diagnosis Date Noted   Carotid stenosis, symptomatic, with infarction (HCC) 11/13/2020   Gout flare 11/10/2020   UTI (urinary tract infection) 11/10/2020   Left carotid artery stenosis 11/10/2020   Chronic kidney disease 11/10/2020   Left  basal ganglia embolic stroke (HCC) 10/24/2020   PAC (premature atrial contraction)    Pre-procedural cardiovascular examination    Ischemic stroke (HCC) 10/20/2020   TIA (transient ischemic attack) 10/19/2020   Right hemiparesis (HCC) 10/19/2020   Type 2 diabetes mellitus with stage 3 chronic kidney disease, without long-term current use of insulin (HCC) 08/24/2018   Hyperlipidemia, unspecified 07/19/2017   Hypertension 07/19/2017   PUD (peptic ulcer disease) 07/19/2017   Type 2 diabetes mellitus (HCC) 07/19/2017   Personal history of gout 08/12/2016   Anemia, unspecified 04/09/2016   Hypothyroidism, acquired 12/10/2015    Jewel Venditto PT, DPT 12/09/2020, 2:08 PM  [place] [facility]  MAIN [facility] SERVICES [address] [place], [place], [address] Phone: [phone]   Fax:  [phone]  Name: Keiondra Brookover MRN: [id number] Date of Birth: 12/14/1928

## 2020-12-10 ENCOUNTER — Ambulatory Visit: Payer: Medicare HMO | Admitting: Physical Therapy

## 2020-12-10 NOTE — Patient Instructions (Signed)
Read printed Psalms 23 using slow rate and prosody

## 2020-12-10 NOTE — Therapy (Signed)
[facility] MAIN [facility] SERVICES [address] [place], [place], [address] Phone: [phone]   Fax:  [phone]  Speech Language Pathology Treatment  Patient Details  Name: Cassandra Schaefer MRN: [id number] Date of Birth: [birth year] Referring Provider (SLP): Barbette Reichmann   Encounter Date: 12/09/2020   End of Session - 12/10/20 1348     Visit Number 2    Number of Visits 25    Date for SLP Re-Evaluation 02/27/21    Authorization Type [employer]    Authorization Time Period 12/04/2020 thru 02/27/2021    Authorization - Visit Number 2    Progress Note Due on Visit 10    SLP Start Time 1230    SLP Stop Time  1300    SLP Time Calculation (min) 30 min    Activity Tolerance Patient tolerated treatment well             Past Medical History:  Diagnosis Date   Anemia    Cough    RESOLVING, NO FEVER   Diabetes mellitus without complication (HCC)    Gout    Hypertension    Hypothyroidism    PUD (peptic ulcer disease)    Stroke [facility])    Wears dentures    full upper, partial lower    Past Surgical History:  Procedure Laterality Date   AMPUTATION TOE Left 12/14/2017   Procedure: AMPUTATION TOE-4TH MPJ;  Surgeon: Gwyneth Revels, DPM;  Location: [facility];  Service: Podiatry;  Laterality: Left;  IVA LOCAL Diabetic - oral meds   APPENDECTOMY     CATARACT EXTRACTION W/PHACO Right 06/23/2015   Procedure: CATARACT EXTRACTION PHACO AND INTRAOCULAR LENS PLACEMENT (IOC);  Surgeon: Sallee Lange, MD;  Location: [facility];  Service: Ophthalmology;  Laterality: Right;  [place] 01:28 AP% 23.4 CDE 36.14 fluid pack lot # [id number]   CATARACT EXTRACTION W/PHACO Left 07/28/2015   Procedure: CATARACT EXTRACTION PHACO AND INTRAOCULAR LENS PLACEMENT (IOC);  Surgeon: Sallee Lange, MD;  Location: [facility];  Service: Ophthalmology;  Laterality: Left;  [place]    1:29.7 AP%  24.9 CDE   41.32 fluid casette lot #[id number] H exp 09/23/2016   COONOSCOPY      AND ENDOSCOPY   DILATION AND CURETTAGE OF UTERUS     ENDARTERECTOMY Left 11/13/2020   Procedure: Evacuation of left neck hematoma;  Surgeon: Renford Dills, MD;  Location: [facility];  Service: Vascular;  Laterality: Left;   ENDARTERECTOMY Left 11/13/2020   Procedure: ENDARTERECTOMY CAROTID;  Surgeon: Annice Needy, MD;  Location: [facility];  Service: Vascular;  Laterality: Left;   TONSILLECTOMY     TUBAL LIGATION      There were no vitals filed for this visit.   Subjective Assessment - 12/10/20 1341     Subjective "I told [name] my session was yesterday"    Currently in Pain? No/denies                   ADULT SLP TREATMENT - 12/10/20 0001       Treatment Provided   Treatment provided Cognitive-Linquistic      Cognitive-Linquistic Treatment   Treatment focused on Aphasia;Dysarthria;Patient/family/caregiver education    Skilled Treatment DYSARHTRIA: 25% speech intelligibility when reading passage from New Testament d/t fast rate of speech, deletion of words, imprecise articulation and semantic paraphasias; with maximal support pt improved to 50% intelligibility              SLP Education - 12/10/20 1348  Education Details dysarthria, word finding with increased cognitive load    Person(s) Educated Patient    Methods Explanation;Demonstration    Comprehension Verbalized understanding;Returned demonstration;Need further instruction              SLP Short Term Goals - 12/05/20 1542       SLP SHORT TERM GOAL #1   Title Pt will list 10 items within semi-complex category over 3 consecutive sessions.    Baseline new goal    Time 10   sessions   Status New      SLP SHORT TERM GOAL #2   Title With supervision cues, pt will utilize speech intelligibility strategies at the connected sentence level to achieve > 95% speech intelligibility.    Baseline new goal    Time 10    Period --   sessions   Status New      SLP SHORT TERM GOAL #3   Title Pt will  read at the paragraph level with > 75% speech intelligibility.    Baseline new goal    Time 10    Period --   session   Status New              SLP Long Term Goals - 12/05/20 1544       SLP LONG TERM GOAL #1   Title Pt will utilize word finding strategies to effectively communicate abstract/coomplex ideas/information.    Baseline new goal    Time 12    Period Weeks    Status New    Target Date 02/27/21      SLP LONG TERM GOAL #2   Title Pt will speech intelligibility strategies to achieve > 95% speech intelligibility during complex conversation.    Baseline new goal    Period Weeks    Status New    Target Date 02/27/21              Plan - 12/10/20 1349     Clinical Impression Statement Pt presents with mild to moderate word finding deficits as well as dysarthria. Pt's speech intelligibility is significantly reduced with only ~ 50% speech intelligibility at the conversation level. As a result, skilled ST intervention is required to improve speech intelligibility to increase pt's functional communication in daily living, leisure and community activities.    Speech Therapy Frequency 2x / week    Duration 12 weeks    Treatment/Interventions Language facilitation;Functional tasks;SLP instruction and feedback;Patient/family education    Potential to Achieve Goals Good    SLP Home Exercise Plan provided, see pt instruction section    Consulted and Agree with Plan of Care Patient             Patient will benefit from skilled therapeutic intervention in order to improve the following deficits and impairments:   Aphasia  Dysarthria and anarthria  Left basal ganglia embolic stroke [facility])    Problem List Patient Active Problem List   Diagnosis Date Noted   Carotid stenosis, symptomatic, with infarction (HCC) 11/13/2020   Gout flare 11/10/2020   UTI (urinary tract infection) 11/10/2020   Left carotid artery stenosis 11/10/2020   Chronic kidney disease 11/10/2020    Left basal ganglia embolic stroke (HCC) 10/24/2020   PAC (premature atrial contraction)    Pre-procedural cardiovascular examination    Ischemic stroke (HCC) 10/20/2020   TIA (transient ischemic attack) 10/19/2020   Right hemiparesis (HCC) 10/19/2020   Type 2 diabetes mellitus with stage 3 chronic kidney disease, without long-term current  use of insulin (HCC) 08/24/2018   Hyperlipidemia, unspecified 07/19/2017   Hypertension 07/19/2017   PUD (peptic ulcer disease) 07/19/2017   Type 2 diabetes mellitus (HCC) 07/19/2017   Personal history of gout 08/12/2016   Anemia, unspecified 04/09/2016   Hypothyroidism, acquired 12/10/2015   Mildred Bollard B. Dreama Saa M.S., CCC-SLP, [facility] Speech-Language Pathologist Rehabilitation Services Office [phone]  [name] 12/10/2020, 1:50 PM  [place] [facility] [facility] MAIN [facility] SERVICES [address] [place], [place], [address] Phone: [phone]   Fax:  [phone]   Name: Laurena Valko MRN: [id number] Date of Birth: August 02, 1928

## 2020-12-11 ENCOUNTER — Encounter: Payer: Self-pay | Admitting: Physical Therapy

## 2020-12-11 ENCOUNTER — Ambulatory Visit: Payer: Medicare HMO | Admitting: Speech Pathology

## 2020-12-11 ENCOUNTER — Other Ambulatory Visit: Payer: Self-pay

## 2020-12-11 ENCOUNTER — Ambulatory Visit: Payer: Medicare HMO | Admitting: Physical Therapy

## 2020-12-11 ENCOUNTER — Ambulatory Visit: Payer: Medicare HMO

## 2020-12-11 DIAGNOSIS — R482 Apraxia: Secondary | ICD-10-CM

## 2020-12-11 DIAGNOSIS — R2681 Unsteadiness on feet: Secondary | ICD-10-CM | POA: Diagnosis not present

## 2020-12-11 DIAGNOSIS — R4701 Aphasia: Secondary | ICD-10-CM

## 2020-12-11 DIAGNOSIS — R471 Dysarthria and anarthria: Secondary | ICD-10-CM

## 2020-12-11 DIAGNOSIS — R278 Other lack of coordination: Secondary | ICD-10-CM

## 2020-12-11 DIAGNOSIS — I639 Cerebral infarction, unspecified: Secondary | ICD-10-CM

## 2020-12-11 DIAGNOSIS — M6281 Muscle weakness (generalized): Secondary | ICD-10-CM

## 2020-12-11 DIAGNOSIS — R2689 Other abnormalities of gait and mobility: Secondary | ICD-10-CM

## 2020-12-11 NOTE — Therapy (Signed)
[facility] MAIN [facility] SERVICES [address] [place], [place], [address] Phone: [phone]   Fax:  [phone]  Physical Therapy Treatment  Patient Details  Name: Cassandra Schaefer MRN: [id number] Date of Birth: July 25, 1928 Referring Provider (PT): Dr. Dew/Dr. Marcello Fennel PCP   Encounter Date: 12/11/2020   PT End of Session - 12/11/20 1155     Visit Number 3    Number of Visits 17    Date for PT Re-Evaluation 01/28/21    Authorization Type Medicare    Authorization Time Period 12/02/20 start of care    PT Start Time 1148    PT Stop Time 1229    PT Time Calculation (min) 41 min    Equipment Utilized During Treatment Gait belt    Activity Tolerance Patient tolerated treatment well    Behavior During Therapy WFL for tasks assessed/performed             Past Medical History:  Diagnosis Date   Anemia    Cough    RESOLVING, NO FEVER   Diabetes mellitus without complication (HCC)    Gout    Hypertension    Hypothyroidism    PUD (peptic ulcer disease)    Stroke [facility])    Wears dentures    full upper, partial lower    Past Surgical History:  Procedure Laterality Date   AMPUTATION TOE Left 12/14/2017   Procedure: AMPUTATION TOE-4TH MPJ;  Surgeon: Gwyneth Revels, DPM;  Location: [facility];  Service: Podiatry;  Laterality: Left;  IVA LOCAL Diabetic - oral meds   APPENDECTOMY     CATARACT EXTRACTION W/PHACO Right 06/23/2015   Procedure: CATARACT EXTRACTION PHACO AND INTRAOCULAR LENS PLACEMENT (IOC);  Surgeon: Sallee Lange, MD;  Location: [facility];  Service: Ophthalmology;  Laterality: Right;  [place] 01:28 AP% 23.4 CDE 36.14 fluid pack lot # [id number]   CATARACT EXTRACTION W/PHACO Left 07/28/2015   Procedure: CATARACT EXTRACTION PHACO AND INTRAOCULAR LENS PLACEMENT (IOC);  Surgeon: Sallee Lange, MD;  Location: [facility];  Service: Ophthalmology;  Laterality: Left;  [place]    1:29.7 AP%  24.9 CDE   41.32 fluid casette lot #[id number] H exp  09/23/2016   COONOSCOPY     AND ENDOSCOPY   DILATION AND CURETTAGE OF UTERUS     ENDARTERECTOMY Left 11/13/2020   Procedure: Evacuation of left neck hematoma;  Surgeon: Renford Dills, MD;  Location: [facility];  Service: Vascular;  Laterality: Left;   ENDARTERECTOMY Left 11/13/2020   Procedure: ENDARTERECTOMY CAROTID;  Surgeon: Annice Needy, MD;  Location: [facility];  Service: Vascular;  Laterality: Left;   TONSILLECTOMY     TUBAL LIGATION      There were no vitals filed for this visit.   Subjective Assessment - 12/11/20 1154     Subjective Patient reports doing well; no pain reported; reports fatigue after last session but no pain;    Pertinent History 87 y.o. female with medical history significant for type 2 diabetes mellitus, essential hypertension, acquired hypothyroidism, hyperlipidemia, stage III chronic kidney disease reports increased right sided weakness on 10/19/20. She was diagnosed with left basal ganglia ischemic stroke. She was discharged to inpatient rehab for 10 days and then discharged home. Patient was evaluated by outpatient PT on 11/12/20. CVA was due to carotid stenosis. She underwent left carotid endartectomy on 7/21. Procedure went well however patient suffered from left cervical hematoma and had subsequent surgical procedure to stop the bleed and remove the hematoma on 11/13/20. They were concerned  with her breathing and therefore intubated her for 1 day, she was in ICU for 2 days and transferred to med/surge on 11/15/20. Patient was discharged home on 11/17/20 and is now returning to outpatient PT. She has had the stiches removed and is healing well. Denies any neck pain or stiffness. She lives with her daughter and has 24/7 caregiver support. She is still using RW for most ambulation. She is able to transfer to bedside commode well and is able to stand short time unsupported. She reports feeling very weak and fatigued. She reports she has been dragging her right foot more. She  denies any numbness/tingling; She reports feeling like her legs are wanting to do more but is hesitant because of fear of falling. She is mod I for self care morning routine. She does still receive help with showering. She has been working on increasing her activity but denies any formal exercise.    Limitations Standing;Walking    How long can you sit comfortably? NA    How long can you stand comfortably? 30-60 min with support    How long can you walk comfortably? about 100 feet with RW, still limited to short distances;    Diagnostic tests MRI shows left basal ganglia infarct 10/21/20    Patient Stated Goals "Improve balance and walking and not need RW, get back to independent."    Currently in Pain? No/denies    Multiple Pain Sites No                 TREATMENT: Warm up on Nustep BUE/BLE level 2 x3 min (Unbilled); seat #6   Standing in parallel bars: 2# ankle weight: -alternate march x10 reps each LE -BLE heel raises x15 reps -hip extension SLR x10 reps with mod VCs to keep knee extended for better hip strengthening -hip abduction SLR x10 reps each with BUE rail assist Patient required min-moderate verbal/tactile cues for correct exercise technique.  Seated 2# ankle weight: LAQ with ankle DF x15 reps each LE with cues to increase terminal knee extension;   Standing in parallel bars: -forward step up on 6 inch step with 2 rail assist x10 reps each LE; Pt exhibits increased foot drag on RLE with increased difficulty clearing foot;  NMR: Standing on airex pad:  Feet together eyes open 30 sec hold, eyes closed 15 sec hold x2 reps each; Patient required CGA to min A for balance recovery with increased loss of balance to left side with prolonged standing;   Feet together:   Ball pass side/side x10 reps  One foot on airex, one foot on 6 inch step:   Unsupported standing 15 sec hold x1 rep each foot on step   Progressed to lateral head turns x5 reps each foot on step Modified  tandem stance:  Unsupported 10 sec hold x2 reps each foot in front;  Patient required min A with balance exercise with increased instability noted with narrow base of support. She had a harder time standing with right foot on airex and left foot on step due to weakness in RLE;   Patient tolerated session well. She does report increased fatigue at end of session. She required CGA to min A throughout session for safety;                         PT Education - 12/11/20 1155     Education Details LE strengthening/balance;    Person(s) Educated Patient    Methods  Explanation;Verbal cues    Comprehension Verbalized understanding;Returned demonstration;Verbal cues required;Need further instruction              PT Short Term Goals - 12/03/20 0827       PT SHORT TERM GOAL #1   Title Patient will be adherent to HEP at least 3x a week to improve functional strength and balance for better safety at home.    Time 4    Period Weeks    Status New    Target Date 12/31/20      PT SHORT TERM GOAL #2   Title Patient will be independent in transferring sit<>Stand without pushing on arm rests to improve ability to get up from chair.    Time 4    Period Weeks    Status New    Target Date 12/31/20               PT Long Term Goals - 12/03/20 0828       PT LONG TERM GOAL #1   Title Patient (> 66 years old) will complete five times sit to stand test in < 15 seconds indicating an increased LE strength and improved balance.    Time 8    Period Weeks    Status New    Target Date 01/28/21      PT LONG TERM GOAL #2   Title Patient will increase six minute walk test distance to >400 feet for improve gait ability    Time 8    Period Weeks    Status New    Target Date 01/28/21      PT LONG TERM GOAL #3   Title Patient will increase 10 meter walk test to >1.10m/s as to improve gait speed for better community ambulation and to reduce fall risk.    Time 8    Period Weeks     Status New    Target Date 01/28/21      PT LONG TERM GOAL #4   Title Patient will be independent with ascend/descend 6 steps using single UE in step over step pattern without LOB.    Time 8    Period Weeks    Status New    Target Date 01/28/21      PT LONG TERM GOAL #5   Title Patient will demonstrate an improved Berg Balance Score of >45/56 as to demonstrate improved balance with ADLs such as sitting/standing and transfer balance and reduced fall risk.    Time 8    Period Weeks    Status New    Target Date 01/28/21      PT LONG TERM GOAL #6   Title Patient will improve FOTO score to >60% to indicate improved functional mobility with ADLs.    Time 8    Period Weeks    Status New    Target Date 01/28/21                   Plan - 12/11/20 1206     Clinical Impression Statement Patient motivated and participated well within session. She was instructed in advanced LE strengtheing utilizing ankle weight for resistance. Patient does fatigue quickly requiring intermittent seated rest break for recovery; Patient does require min VCS for proper positioning/exercise technique. Patient requires rail assist for safety. She was instructed in advanced balance exercise. Utilized compliant surface to challenge ankle strategies for better stance control and balance recovery; Patient does require min A for safety. She would benefit from additional  skilled PT Intervention to improve strength, balance and mobility;    Personal Factors and Comorbidities Age;Comorbidity 3+;Fitness    Comorbidities HTN, CKD, fall risk, type 2 diabetes,    Examination-Activity Limitations Lift;Locomotion Level;Squat;Stairs;Stand;Toileting;Transfers;Bathing    Examination-Participation Restrictions Cleaning;Community Activity;Laundry;Meal Prep;Shop;Volunteer;Driving    Stability/Clinical Decision Making Stable/Uncomplicated    PT Frequency 2x / week    PT Duration 8 weeks    PT Treatment/Interventions  Cryotherapy;Electrical Stimulation;Moist Heat;Gait training;Stair training;Functional mobility training;Therapeutic activities;Therapeutic exercise;Neuromuscular re-education;Balance training;Patient/family education;Orthotic Fit/Training;Energy conservation    PT Next Visit Plan work on LE strengthening and balance;    PT Home Exercise Plan Initiated, see patient instructions    Consulted and Agree with Plan of Care Patient             Patient will benefit from skilled therapeutic intervention in order to improve the following deficits and impairments:  Abnormal gait, Decreased balance, Decreased endurance, Decreased mobility, Difficulty walking, Cardiopulmonary status limiting activity, Decreased activity tolerance, Decreased coordination, Decreased strength  Visit Diagnosis: Muscle weakness (generalized)  Other lack of coordination  Unsteadiness on feet  Other abnormalities of gait and mobility     Problem List Patient Active Problem List   Diagnosis Date Noted   Carotid stenosis, symptomatic, with infarction (HCC) 11/13/2020   Gout flare 11/10/2020   UTI (urinary tract infection) 11/10/2020   Left carotid artery stenosis 11/10/2020   Chronic kidney disease 11/10/2020   Left basal ganglia embolic stroke (HCC) 10/24/2020   PAC (premature atrial contraction)    Pre-procedural cardiovascular examination    Ischemic stroke (HCC) 10/20/2020   TIA (transient ischemic attack) 10/19/2020   Right hemiparesis (HCC) 10/19/2020   Type 2 diabetes mellitus with stage 3 chronic kidney disease, without long-term current use of insulin (HCC) 08/24/2018   Hyperlipidemia, unspecified 07/19/2017   Hypertension 07/19/2017   PUD (peptic ulcer disease) 07/19/2017   Type 2 diabetes mellitus (HCC) 07/19/2017   Personal history of gout 08/12/2016   Anemia, unspecified 04/09/2016   Hypothyroidism, acquired 12/10/2015    [place] PT, DPT 12/11/2020, 12:31 PM  [place] [facility] [address] [place], [place], [address] Phone: [phone]   Fax:  [phone]  Name: Neysa BonitoJanet Ann Schaefer MRN: [id number] Date of Birth: 24-Jul-1928

## 2020-12-12 NOTE — Therapy (Signed)
[facility] MAIN [facility] SERVICES [address] [place], [place], [address] Phone: [phone]   Fax:  [phone]  Occupational Therapy Treatment  Patient Details  Name: Cassandra Schaefer MRN: [id number] Date of Birth: 11/04/1928 Referring Provider (OT): Dr. Barbette Reichmann   Encounter Date: 12/11/2020   OT End of Session - 12/12/20 0909     Visit Number 4    Number of Visits 24    Date for OT Re-Evaluation 02/01/21    Authorization Time Period Reporting period starting 11/10/2020    OT Start Time 1300    OT Stop Time 1346    OT Time Calculation (min) 46 min    Equipment Utilized During Treatment wc    Activity Tolerance Patient tolerated treatment well    Behavior During Therapy WFL for tasks assessed/performed             Past Medical History:  Diagnosis Date   Anemia    Cough    RESOLVING, NO FEVER   Diabetes mellitus without complication (HCC)    Gout    Hypertension    Hypothyroidism    PUD (peptic ulcer disease)    Stroke [facility])    Wears dentures    full upper, partial lower    Past Surgical History:  Procedure Laterality Date   AMPUTATION TOE Left 12/14/2017   Procedure: AMPUTATION TOE-4TH MPJ;  Surgeon: Gwyneth Revels, DPM;  Location: [facility];  Service: Podiatry;  Laterality: Left;  IVA LOCAL Diabetic - oral meds   APPENDECTOMY     CATARACT EXTRACTION W/PHACO Right 06/23/2015   Procedure: CATARACT EXTRACTION PHACO AND INTRAOCULAR LENS PLACEMENT (IOC);  Surgeon: Sallee Lange, MD;  Location: [facility];  Service: Ophthalmology;  Laterality: Right;  [place] 01:28 AP% 23.4 CDE 36.14 fluid pack lot # [id number]   CATARACT EXTRACTION W/PHACO Left 07/28/2015   Procedure: CATARACT EXTRACTION PHACO AND INTRAOCULAR LENS PLACEMENT (IOC);  Surgeon: Sallee Lange, MD;  Location: [facility];  Service: Ophthalmology;  Laterality: Left;  [place]    1:29.7 AP%  24.9 CDE   41.32 fluid casette lot #[id number] H exp 09/23/2016    COONOSCOPY     AND ENDOSCOPY   DILATION AND CURETTAGE OF UTERUS     ENDARTERECTOMY Left 11/13/2020   Procedure: Evacuation of left neck hematoma;  Surgeon: Renford Dills, MD;  Location: [facility];  Service: Vascular;  Laterality: Left;   ENDARTERECTOMY Left 11/13/2020   Procedure: ENDARTERECTOMY CAROTID;  Surgeon: Annice Needy, MD;  Location: [facility];  Service: Vascular;  Laterality: Left;   TONSILLECTOMY     TUBAL LIGATION      There were no vitals filed for this visit.   Subjective Assessment - 12/11/20 1318     Subjective  "My aim has gotten better."    Patient is accompanied by: Family member    Pertinent History R ankle pain, gout, T2DM, HTN, CKDIII; R/L carotid endarectomy    Patient Stated Goals "I want to improve my aim when I'm using my R arm."    Currently in Pain? No/denies    Pain Score 0-No pain           Occupational Therapy Treatment: Neuro re-ed: Participation in RUE GMC/FMC exercises with card shuffling, dealing, flipping, gathering.  Pt not able to shuffle effectively but was able to complete the latter with extra time/slower speed.  Used thin sticks to place into pegboard first at table top level, then challenged further with pegboard on incline.  Pt  required non skid surface to pick up sticks using R hand.  Facilitation of GMC to mark small target on paper with marker (1" circle) reaching to various placements on table top with fair accuracy.   Response to Treatment: Pt feels like her "aim" is improving, meaning increased FMC/GMC throughout RUE.  RUE remains moderately ataxic which affects precision of movement when using RUE for self care.  Pt reports that she forces herself to use R arm to self feed, despite dropping food or making a mess.  Pt will continue to benefit from skilled OT for improving GMC/FMC throughout RUE for improved ADL performance.       OT Education - 12/11/20 1318     Education Details HEP progression    Person(s) Educated Patient     Methods Explanation;Verbal cues;Demonstration    Comprehension Verbalized understanding;Need further instruction              OT Short Term Goals - 12/04/20 1254       OT SHORT TERM GOAL #1   Title Pt will perform HEP for RUE strength/coordination independently.    Baseline Eval: HEP not yet established in outpatient, but pt is working with putty from [name]; 12/04/2020; [facility] HEP initiated this day with mod vc after return demo.    Time 6    Period Weeks    Status On-going    Target Date 12/21/20               OT Long Term Goals - 12/04/20 1255       OT LONG TERM GOAL #1   Title Pt will improve hand writing to 100% legibility with R hand to be able to independently write a check.    Baseline Eval: signature is 75% legible with R hand, requiring extra time; 12/04/2020: no change from eval    Time 12    Period Weeks    Status On-going    Target Date 02/01/21      OT LONG TERM GOAL #2   Title Pt will improve R hand coordination to enable good manipulation of iphone using R dominant hand    Baseline Eval: Pt uses L hand d/t R hand lacking sufficient FMC to press buttons on phone; 12/04/2020: no change from eval    Time 12    Period Weeks    Status On-going    Target Date 02/01/21      OT LONG TERM GOAL #3   Title Pt will improve GMC throughout RUE to enable pt to reach for and pick up ADL supplies with R dominant hand without dropping or knocking over objects.    Baseline Eval: Pt reports RUE is clumsy and easily knocks over objects when trying to reach for ADL supplies.  Pt verbalizes that her "aim is off."; 12/04/2020: pt reports slight improvement since eval and has started using her R hand to eat, but still feels clumsy.    Time 12    Period Weeks    Status On-going    Target Date 02/01/21      OT LONG TERM GOAL #5   Title Pt will perform dynamic standing tasks with modified indep, AD as needed in order to reduce fall risk with ADLs    Baseline Eval: CGA-min A for  all dynamic standing tasks using RW; 12/04/2020: pt is using RW in home with distant supv, but family still provides close supv on stairs    Time 12    Period Weeks  Status On-going    Target Date 02/01/21                   Plan - 12/12/20 9381     Clinical Impression Statement Pt feels like her "aim" is improving, meaning increased FMC/GMC throughout RUE.  RUE remains moderately ataxic which affects precision of movement when using RUE for self care.  Pt reports that she forces herself to use R arm to self feed, despite dropping food or making a mess.  Pt will continue to benefit from skilled OT for improving GMC/FMC throughout RUE for improved ADL performance.    OT Occupational Profile and History Detailed Assessment- Review of Records and additional review of physical, cognitive, psychosocial history related to current functional performance    Occupational performance deficits (Please refer to evaluation for details): ADL's;Leisure;IADL's    Body Structure / Function / Physical Skills ADL;Coordination;Endurance;GMC;UE functional use;Balance;Sensation;Body mechanics;IADL;Pain;Dexterity;FMC;Strength;Gait;Mobility    Rehab Potential Good    Clinical Decision Making Several treatment options, min-mod task modification necessary    Comorbidities Affecting Occupational Performance: May have comorbidities impacting occupational performance    Modification or Assistance to Complete Evaluation  Min-Moderate modification of tasks or assist with assess necessary to complete eval    OT Frequency 2x / week    OT Duration 12 weeks    OT Treatment/Interventions Self-care/ADL training;Therapeutic exercise;DME and/or AE instruction;Functional Mobility Training;Balance training;Neuromuscular education;Therapeutic activities;Patient/family education    Consulted and Agree with Plan of Care Patient             Patient will benefit from skilled therapeutic intervention in order to improve the  following deficits and impairments:   Body Structure / Function / Physical Skills: ADL, Coordination, Endurance, GMC, UE functional use, Balance, Sensation, Body mechanics, IADL, Pain, Dexterity, FMC, Strength, Gait, Mobility       Visit Diagnosis: Muscle weakness (generalized)  Other lack of coordination  Apraxia    Problem List Patient Active Problem List   Diagnosis Date Noted   Carotid stenosis, symptomatic, with infarction (HCC) 11/13/2020   Gout flare 11/10/2020   UTI (urinary tract infection) 11/10/2020   Left carotid artery stenosis 11/10/2020   Chronic kidney disease 11/10/2020   Left basal ganglia embolic stroke (HCC) 10/24/2020   PAC (premature atrial contraction)    Pre-procedural cardiovascular examination    Ischemic stroke (HCC) 10/20/2020   TIA (transient ischemic attack) 10/19/2020   Right hemiparesis (HCC) 10/19/2020   Type 2 diabetes mellitus with stage 3 chronic kidney disease, without long-term current use of insulin (HCC) 08/24/2018   Hyperlipidemia, unspecified 07/19/2017   Hypertension 07/19/2017   PUD (peptic ulcer disease) 07/19/2017   Type 2 diabetes mellitus (HCC) 07/19/2017   Personal history of gout 08/12/2016   Anemia, unspecified 04/09/2016   Hypothyroidism, acquired 12/10/2015   Danelle Earthly, MS, OTR/L  Otis Dials 12/12/2020, 9:23 AM  [facility] MAIN [facility] SERVICES [address] [place], [place], [address] Phone: [phone]   Fax:  [phone]  Name: Dereona Kolodny MRN: [id number] Date of Birth: Oct 13, 1928

## 2020-12-12 NOTE — Patient Instructions (Signed)
Using speech intelligibility strategies (SLOP) - read paragraph and list of multi-syllabic words

## 2020-12-12 NOTE — Therapy (Signed)
[facility] MAIN [facility] SERVICES [address] [place], [place], [address] Phone: [phone]   Fax:  [phone]  Speech Language Pathology Treatment  Patient Details  Name: Cassandra Schaefer MRN: [id number] Date of Birth: January 09, 1929 Referring Provider (SLP): Barbette Reichmann   Encounter Date: 12/11/2020   End of Session - 12/12/20 1148     Visit Number 3    Number of Visits 25    Date for SLP Re-Evaluation 02/27/21    Authorization Type [employer]    Authorization Time Period 12/04/2020 thru 02/27/2021    Authorization - Visit Number 3    Progress Note Due on Visit 10    SLP Start Time 1400    SLP Stop Time  1445    SLP Time Calculation (min) 45 min    Activity Tolerance Patient tolerated treatment well             Past Medical History:  Diagnosis Date   Anemia    Cough    RESOLVING, NO FEVER   Diabetes mellitus without complication (HCC)    Gout    Hypertension    Hypothyroidism    PUD (peptic ulcer disease)    Stroke [facility])    Wears dentures    full upper, partial lower    Past Surgical History:  Procedure Laterality Date   AMPUTATION TOE Left 12/14/2017   Procedure: AMPUTATION TOE-4TH MPJ;  Surgeon: Gwyneth Revels, DPM;  Location: [facility];  Service: Podiatry;  Laterality: Left;  IVA LOCAL Diabetic - oral meds   APPENDECTOMY     CATARACT EXTRACTION W/PHACO Right 06/23/2015   Procedure: CATARACT EXTRACTION PHACO AND INTRAOCULAR LENS PLACEMENT (IOC);  Surgeon: Sallee Lange, MD;  Location: [facility];  Service: Ophthalmology;  Laterality: Right;  [place] 01:28 AP% 23.4 CDE 36.14 fluid pack lot # [id number]   CATARACT EXTRACTION W/PHACO Left 07/28/2015   Procedure: CATARACT EXTRACTION PHACO AND INTRAOCULAR LENS PLACEMENT (IOC);  Surgeon: Sallee Lange, MD;  Location: [facility];  Service: Ophthalmology;  Laterality: Left;  [place]    1:29.7 AP%  24.9 CDE   41.32 fluid casette lot #[id number] H exp 09/23/2016   COONOSCOPY      AND ENDOSCOPY   DILATION AND CURETTAGE OF UTERUS     ENDARTERECTOMY Left 11/13/2020   Procedure: Evacuation of left neck hematoma;  Surgeon: Renford Dills, MD;  Location: [facility];  Service: Vascular;  Laterality: Left;   ENDARTERECTOMY Left 11/13/2020   Procedure: ENDARTERECTOMY CAROTID;  Surgeon: Annice Needy, MD;  Location: [facility];  Service: Vascular;  Laterality: Left;   TONSILLECTOMY     TUBAL LIGATION      There were no vitals filed for this visit.   Subjective Assessment - 12/12/20 1143     Subjective pt pleasant, somewhat tired as ST is 3rd treatment session today    Currently in Pain? No/denies                   ADULT SLP TREATMENT - 12/12/20 0001       Treatment Provided   Treatment provided Cognitive-Linquistic      Cognitive-Linquistic Treatment   Treatment focused on Aphasia;Dysarthria;Patient/family/caregiver education    Skilled Treatment DYSARHTRIA: maximal assistance required to achieve 75% speech intelligibility when reading 4 sentence paragraph containing mildy complex word and sentence structure; cues included verbal sentence by sentence model, slow rate, appropriate pauses and prosody; moderate assistance for emergent awareness; moderate cues required to achive 75% speech intelligibility when reading  multi-syllabic word lists              SLP Education - 12/12/20 1148     Education Details dysarthria and speech intelligibility strategies    Person(s) Educated Patient    Methods Explanation;Demonstration;Handout;Verbal cues    Comprehension Verbalized understanding;Returned demonstration;Need further instruction              SLP Short Term Goals - 12/05/20 1542       SLP SHORT TERM GOAL #1   Title Pt will list 10 items within semi-complex category over 3 consecutive sessions.    Baseline new goal    Time 10   sessions   Status New      SLP SHORT TERM GOAL #2   Title With supervision cues, pt will utilize speech  intelligibility strategies at the connected sentence level to achieve > 95% speech intelligibility.    Baseline new goal    Time 10    Period --   sessions   Status New      SLP SHORT TERM GOAL #3   Title Pt will read at the paragraph level with > 75% speech intelligibility.    Baseline new goal    Time 10    Period --   session   Status New              SLP Long Term Goals - 12/05/20 1544       SLP LONG TERM GOAL #1   Title Pt will utilize word finding strategies to effectively communicate abstract/coomplex ideas/information.    Baseline new goal    Time 12    Period Weeks    Status New    Target Date 02/27/21      SLP LONG TERM GOAL #2   Title Pt will speech intelligibility strategies to achieve > 95% speech intelligibility during complex conversation.    Baseline new goal    Period Weeks    Status New    Target Date 02/27/21              Plan - 12/12/20 1148     Clinical Impression Statement Pt presents with moderate dysarthria that is c/b by fast rate, imprecise articulation, reduced vocal intensity and some deletion of suffixes, cluster reduction. As a result, pt's speech intelligibility is reduced to 50% at the conversation level within a mildly noisy environment. Skilled ST is required to improve pt's functional communication, reduce caregiver burden and return pt to church and community activities.    Speech Therapy Frequency 2x / week    Duration 12 weeks    Treatment/Interventions Language facilitation;Functional tasks;SLP instruction and feedback;Patient/family education    Potential to Achieve Goals Good    SLP Home Exercise Plan provided, see pt instruction section    Consulted and Agree with Plan of Care Patient             Patient will benefit from skilled therapeutic intervention in order to improve the following deficits and impairments:   Dysarthria and anarthria  Aphasia  Left basal ganglia embolic stroke [facility])    Problem  List Patient Active Problem List   Diagnosis Date Noted   Carotid stenosis, symptomatic, with infarction (HCC) 11/13/2020   Gout flare 11/10/2020   UTI (urinary tract infection) 11/10/2020   Left carotid artery stenosis 11/10/2020   Chronic kidney disease 11/10/2020   Left basal ganglia embolic stroke (HCC) 10/24/2020   PAC (premature atrial contraction)    Pre-procedural cardiovascular examination  Ischemic stroke (HCC) 10/20/2020   TIA (transient ischemic attack) 10/19/2020   Right hemiparesis (HCC) 10/19/2020   Type 2 diabetes mellitus with stage 3 chronic kidney disease, without long-term current use of insulin (HCC) 08/24/2018   Hyperlipidemia, unspecified 07/19/2017   Hypertension 07/19/2017   PUD (peptic ulcer disease) 07/19/2017   Type 2 diabetes mellitus (HCC) 07/19/2017   Personal history of gout 08/12/2016   Anemia, unspecified 04/09/2016   Hypothyroidism, acquired 12/10/2015   Rebel Laughridge B. Dreama Saa M.S., CCC-SLP, [facility] Speech-Language Pathologist Rehabilitation Services Office [phone]  [name] 12/12/2020, 11:52 AM  [facility] MAIN [facility] SERVICES [address] [place], [place], [address] Phone: [phone]   Fax:  [phone]   Name: Cassandra Schaefer MRN: [id number] Date of Birth: 13-Jun-1928

## 2020-12-15 ENCOUNTER — Encounter: Payer: Medicare HMO | Admitting: Occupational Therapy

## 2020-12-15 ENCOUNTER — Ambulatory Visit: Payer: Medicare HMO | Admitting: Physical Therapy

## 2020-12-16 ENCOUNTER — Encounter: Payer: Self-pay | Admitting: Occupational Therapy

## 2020-12-16 ENCOUNTER — Ambulatory Visit: Payer: Medicare HMO | Admitting: Speech Pathology

## 2020-12-16 ENCOUNTER — Other Ambulatory Visit: Payer: Self-pay

## 2020-12-16 ENCOUNTER — Ambulatory Visit: Payer: Medicare HMO | Admitting: Occupational Therapy

## 2020-12-16 ENCOUNTER — Ambulatory Visit: Payer: Medicare HMO

## 2020-12-16 DIAGNOSIS — R471 Dysarthria and anarthria: Secondary | ICD-10-CM

## 2020-12-16 DIAGNOSIS — R278 Other lack of coordination: Secondary | ICD-10-CM

## 2020-12-16 DIAGNOSIS — R2681 Unsteadiness on feet: Secondary | ICD-10-CM

## 2020-12-16 DIAGNOSIS — M6281 Muscle weakness (generalized): Secondary | ICD-10-CM

## 2020-12-16 DIAGNOSIS — R4701 Aphasia: Secondary | ICD-10-CM

## 2020-12-16 DIAGNOSIS — R2689 Other abnormalities of gait and mobility: Secondary | ICD-10-CM

## 2020-12-16 DIAGNOSIS — I639 Cerebral infarction, unspecified: Secondary | ICD-10-CM

## 2020-12-16 NOTE — Therapy (Signed)
[facility] [facility] MAIN [facility] SERVICES [address] [place], [place], [address] Phone: [phone]   Fax:  [phone]  Physical Therapy Treatment  Patient Details  Name: Cassandra Schaefer MRN: [id number] Date of Birth: 08/26/1928 Referring Provider (PT): Dr. Dew/Dr. Marcello Fennel PCP   Encounter Date: 12/16/2020   PT End of Session - 12/16/20 1716     Visit Number 4    Number of Visits 17    Date for PT Re-Evaluation 01/28/21    Authorization Type Medicare    Authorization Time Period 12/02/20 start of care    PT Start Time 1602    PT Stop Time 1647    PT Time Calculation (min) 45 min    Equipment Utilized During Treatment Gait belt    Activity Tolerance Patient tolerated treatment well    Behavior During Therapy WFL for tasks assessed/performed             Past Medical History:  Diagnosis Date   Anemia    Cough    RESOLVING, NO FEVER   Diabetes mellitus without complication (HCC)    Gout    Hypertension    Hypothyroidism    PUD (peptic ulcer disease)    Stroke [facility])    Wears dentures    full upper, partial lower    Past Surgical History:  Procedure Laterality Date   AMPUTATION TOE Left 12/14/2017   Procedure: AMPUTATION TOE-4TH MPJ;  Surgeon: Gwyneth Revels, DPM;  Location: [facility];  Service: Podiatry;  Laterality: Left;  IVA LOCAL Diabetic - oral meds   APPENDECTOMY     CATARACT EXTRACTION W/PHACO Right 06/23/2015   Procedure: CATARACT EXTRACTION PHACO AND INTRAOCULAR LENS PLACEMENT (IOC);  Surgeon: Sallee Lange, MD;  Location: [facility];  Service: Ophthalmology;  Laterality: Right;  [place] 01:28 AP% 23.4 CDE 36.14 fluid pack lot # [id number]   CATARACT EXTRACTION W/PHACO Left 07/28/2015   Procedure: CATARACT EXTRACTION PHACO AND INTRAOCULAR LENS PLACEMENT (IOC);  Surgeon: Sallee Lange, MD;  Location: [facility] OR[facility];  Service: Ophthalmology;  Laterality: Left;  [place]    1:29.7 AP%  24.9 CDE   41.32 fluid casette lot #[id number] H exp  09/23/2016   COONOSCOPY     AND ENDOSCOPY   DILATION AND CURETTAGE OF UTERUS     ENDARTERECTOMY Left 11/13/2020   Procedure: Evacuation of left neck hematoma;  Surgeon: Renford Dills, MD;  Location: [facility] OR[facility];  Service: Vascular;  Laterality: Left;   ENDARTERECTOMY Left 11/13/2020   Procedure: ENDARTERECTOMY CAROTID;  Surgeon: Annice Needy, MD;  Location: [facility] ORS;  Service: Vascular;  Laterality: Left;   TONSILLECTOMY     TUBAL LIGATION      There were no vitals filed for this visit.   Subjective Assessment - 12/16/20 1715     Subjective Patient reports fatigue from having bad diarrhea the past few days. She reports she did the laundry yesterday, had a hard time stooping over to pick up objects.    Pertinent History 87 y.o. female with medical history significant for type 2 diabetes mellitus, essential hypertension, acquired hypothyroidism, hyperlipidemia, stage III chronic kidney disease reports increased right sided weakness on 10/19/20. She was diagnosed with left basal ganglia ischemic stroke. She was discharged to inpatient rehab for 10 days and then discharged home. Patient was evaluated by outpatient PT on 11/12/20. CVA was due to carotid stenosis. She underwent left carotid endartectomy on 7/21. Procedure went well however patient suffered from left cervical hematoma and had subsequent surgical procedure  to stop the bleed and remove the hematoma on 11/13/20. They were concerned with her breathing and therefore intubated her for 1 day, she was in ICU for 2 days and transferred to med/surge on 11/15/20. Patient was discharged home on 11/17/20 and is now returning to outpatient PT. She has had the stiches removed and is healing well. Denies any neck pain or stiffness. She lives with her daughter and has 24/7 caregiver support. She is still using RW for most ambulation. She is able to transfer to bedside commode well and is able to stand short time unsupported. She reports feeling very weak and  fatigued. She reports she has been dragging her right foot more. She denies any numbness/tingling; She reports feeling like her legs are wanting to do more but is hesitant because of fear of falling. She is mod I for self care morning routine. She does still receive help with showering. She has been working on increasing her activity but denies any formal exercise.    Limitations Standing;Walking    How long can you sit comfortably? NA    How long can you stand comfortably? 30-60 min with support    How long can you walk comfortably? about 100 feet with RW, still limited to short distances;    Diagnostic tests MRI shows left basal ganglia infarct 10/21/20    Patient Stated Goals "Improve balance and walking and not need RW, get back to independent."    Currently in Pain? No/denies               TREATMENT:  TherEx: close CGA with cues for sequencing and muscle recruitment   seated:  Soccer ball kick x3 minutes for coordination sequencing and muscle recruitment.  RTB hamstring curl 12x each LE    Standing in parallel bars: -forward step up on 6 inch step with 2 rail assist x10 reps each LE; Pt exhibits increased foot drag on RLE with increased difficulty clearing foot; 6" step toe taps 12x each LE; challenged clearance with RLE 6" step eccentric heel tap 10x each LE; BUE support    NMR: close CGA with cues for safety and body mechanics  Standing with CGA next to support surface:  Airex pad: static stand 30 seconds x 2 trials, noticeable trembling of ankles/LE's with fatigue and challenge to maintain stability Airex pad: horizontal head turns 30 seconds scanning room 10x ; cueing for arc of motion  Airex pad: vertical head turns 30 seconds, cueing for arc of motion, noticeable sway with upward gaze increasing demand on ankle righting reaction musculature Airex pad: one foot on 6" step one foot on airex pad, hold position for 30 seconds, switch legs, 2x each LE;  Airex balance beam:  lateral stepping 4x length of // bars; very challenging for RLE placement; frequent cueing required.    Patient required min A with balance exercise with increased instability noted with narrow base of support. She had a harder time standing with right foot on airex and left foot on step due to weakness in RLE;   Patient tolerated session well. She does report increased fatigue at end of session. She required CGA to min A throughout session for safety;    Patient is very fatigued throughout physical therapy session requiring frequent rest breaks. Patient remains highly motivated despite fatigue. Stabilization is challenging with RLE placement and decreased spatial awareness. She requires frequent external cueing for RLE movements. Close CGA required throughout session to maintain safety. She would benefit from additional skilled  PT Intervention to improve strength, balance and mobility;                    PT Education - 12/16/20 1715     Education Details exercise technique, body mechanics    Person(s) Educated Patient    Methods Explanation;Demonstration;Tactile cues;Verbal cues    Comprehension Verbalized understanding;Returned demonstration;Verbal cues required;Tactile cues required              PT Short Term Goals - 12/03/20 0827       PT SHORT TERM GOAL #1   Title Patient will be adherent to HEP at least 3x a week to improve functional strength and balance for better safety at home.    Time 4    Period Weeks    Status New    Target Date 12/31/20      PT SHORT TERM GOAL #2   Title Patient will be independent in transferring sit<>Stand without pushing on arm rests to improve ability to get up from chair.    Time 4    Period Weeks    Status New    Target Date 12/31/20               PT Long Term Goals - 12/03/20 0828       PT LONG TERM GOAL #1   Title Patient (> 64 years old) will complete five times sit to stand test in < 15 seconds indicating an  increased LE strength and improved balance.    Time 8    Period Weeks    Status New    Target Date 01/28/21      PT LONG TERM GOAL #2   Title Patient will increase six minute walk test distance to >400 feet for improve gait ability    Time 8    Period Weeks    Status New    Target Date 01/28/21      PT LONG TERM GOAL #3   Title Patient will increase 10 meter walk test to >1.31m/s as to improve gait speed for better community ambulation and to reduce fall risk.    Time 8    Period Weeks    Status New    Target Date 01/28/21      PT LONG TERM GOAL #4   Title Patient will be independent with ascend/descend 6 steps using single UE in step over step pattern without LOB.    Time 8    Period Weeks    Status New    Target Date 01/28/21      PT LONG TERM GOAL #5   Title Patient will demonstrate an improved Berg Balance Score of >45/56 as to demonstrate improved balance with ADLs such as sitting/standing and transfer balance and reduced fall risk.    Time 8    Period Weeks    Status New    Target Date 01/28/21      PT LONG TERM GOAL #6   Title Patient will improve FOTO score to >60% to indicate improved functional mobility with ADLs.    Time 8    Period Weeks    Status New    Target Date 01/28/21                   Plan - 12/16/20 1718     Clinical Impression Statement Patient is very fatigued throughout physical therapy session requiring frequent rest breaks. Patient remains highly motivated despite fatigue. Stabilization is challenging with RLE placement and  decreased spatial awareness. She requires frequent external cueing for RLE movements. Close CGA required throughout session to maintain safety. She would benefit from additional skilled PT Intervention to improve strength, balance and mobility;    Personal Factors and Comorbidities Age;Comorbidity 3+;Fitness    Comorbidities HTN, CKD, fall risk, type 2 diabetes,    Examination-Activity Limitations Lift;Locomotion  Level;Squat;Stairs;Stand;Toileting;Transfers;Bathing    Examination-Participation Restrictions Cleaning;Community Activity;Laundry;Meal Prep;Shop;Volunteer;Driving    Stability/Clinical Decision Making Stable/Uncomplicated    PT Frequency 2x / week    PT Duration 8 weeks    PT Treatment/Interventions Cryotherapy;Electrical Stimulation;Moist Heat;Gait training;Stair training;Functional mobility training;Therapeutic activities;Therapeutic exercise;Neuromuscular re-education;Balance training;Patient/family education;Orthotic Fit/Training;Energy conservation    PT Next Visit Plan work on LE strengthening and balance;    PT Home Exercise Plan Initiated, see patient instructions    Consulted and Agree with Plan of Care Patient             Patient will benefit from skilled therapeutic intervention in order to improve the following deficits and impairments:  Abnormal gait, Decreased balance, Decreased endurance, Decreased mobility, Difficulty walking, Cardiopulmonary status limiting activity, Decreased activity tolerance, Decreased coordination, Decreased strength  Visit Diagnosis: Muscle weakness (generalized)  Unsteadiness on feet  Other abnormalities of gait and mobility     Problem List Patient Active Problem List   Diagnosis Date Noted   Carotid stenosis, symptomatic, with infarction (HCC) 11/13/2020   Gout flare 11/10/2020   UTI (urinary tract infection) 11/10/2020   Left carotid artery stenosis 11/10/2020   Chronic kidney disease 11/10/2020   Left basal ganglia embolic stroke (HCC) 10/24/2020   PAC (premature atrial contraction)    Pre-procedural cardiovascular examination    Ischemic stroke (HCC) 10/20/2020   TIA (transient ischemic attack) 10/19/2020   Right hemiparesis (HCC) 10/19/2020   Type 2 diabetes mellitus with stage 3 chronic kidney disease, without long-term current use of insulin (HCC) 08/24/2018   Hyperlipidemia, unspecified 07/19/2017   Hypertension 07/19/2017    PUD (peptic ulcer disease) 07/19/2017   Type 2 diabetes mellitus (HCC) 07/19/2017   Personal history of gout 08/12/2016   Anemia, unspecified 04/09/2016   Hypothyroidism, acquired 12/10/2015   Precious Bard, PT, DPT  12/16/2020, 5:18 PM  [place] [facility] MAIN [facility] SERVICES [address] [place], [place], [address] Phone: [phone]   Fax:  [phone]  Name: Cassandra Schaefer MRN: [id number] Date of Birth: 1928-12-13

## 2020-12-16 NOTE — Therapy (Signed)
[place] [facility] MAIN [facility] SERVICES [address] [place], [place], [address] Phone: [phone]   Fax:  [phone]  Occupational Therapy Treatment  Patient Details  Name: Cassandra Schaefer MRN: [id number] Date of Birth: Aug 04, 1928 Referring Provider (OT): Dr. Barbette Reichmann   Encounter Date: 12/16/2020   OT End of Session - 12/16/20 1613     Visit Number 5    Number of Visits 24    Date for OT Re-Evaluation 02/01/21    Authorization Time Period Reporting period starting 11/10/2020    OT Start Time 1500    OT Stop Time 1545    OT Time Calculation (min) 45 min    Activity Tolerance Patient tolerated treatment well    Behavior During Therapy WFL for tasks assessed/performed             Past Medical History:  Diagnosis Date   Anemia    Cough    RESOLVING, NO FEVER   Diabetes mellitus without complication (HCC)    Gout    Hypertension    Hypothyroidism    PUD (peptic ulcer disease)    Stroke [facility])    Wears dentures    full upper, partial lower    Past Surgical History:  Procedure Laterality Date   AMPUTATION TOE Left 12/14/2017   Procedure: AMPUTATION TOE-4TH MPJ;  Surgeon: Gwyneth Revels, DPM;  Location: [facility];  Service: Podiatry;  Laterality: Left;  IVA LOCAL Diabetic - oral meds   APPENDECTOMY     CATARACT EXTRACTION W/PHACO Right 06/23/2015   Procedure: CATARACT EXTRACTION PHACO AND INTRAOCULAR LENS PLACEMENT (IOC);  Surgeon: Sallee Lange, MD;  Location: [facility] OR[facility];  Service: Ophthalmology;  Laterality: Right;  [place] 01:28 AP% 23.4 CDE 36.14 fluid pack lot # [id number]   CATARACT EXTRACTION W/PHACO Left 07/28/2015   Procedure: CATARACT EXTRACTION PHACO AND INTRAOCULAR LENS PLACEMENT (IOC);  Surgeon: Sallee Lange, MD;  Location: [facility] OR[facility];  Service: Ophthalmology;  Laterality: Left;  [place]    1:29.7 AP%  24.9 CDE   41.32 fluid casette lot #[id number] H exp 09/23/2016   COONOSCOPY     AND ENDOSCOPY   DILATION AND  CURETTAGE OF UTERUS     ENDARTERECTOMY Left 11/13/2020   Procedure: Evacuation of left neck hematoma;  Surgeon: Renford Dills, MD;  Location: [facility] OR[facility];  Service: Vascular;  Laterality: Left;   ENDARTERECTOMY Left 11/13/2020   Procedure: ENDARTERECTOMY CAROTID;  Surgeon: Annice Needy, MD;  Location: [facility];  Service: Vascular;  Laterality: Left;   TONSILLECTOMY     TUBAL LIGATION      There were no vitals filed for this visit.   Subjective Assessment - 12/16/20 1613     Subjective  Pt. reports that her righting is improving.    Patient is accompanied by: Family member    Pertinent History R ankle pain, gout, T2DM, HTN, CKDIII; R/L carotid endarectomy    Currently in Pain? No/denies            OT TREATMENT    Neuro muscular re-education:  Pt. worked on grasping 1" resistive cubes alternating thumb opposition to the tip of the 2nd through 5th digits while the board is placed at a vertical angle. Pt. worked on pressing the cubes back into place while alternating isolated 2nd through 5th digit extension. Pt. worked on grasping, and manipulating 1/2" washers from a magnetic dish using point grasp pattern. Pt. worked on reaching up, stabilizing, and sustaining shoulder elevation while placing the washer over a  small precise target on vertical dowels positioned at various angles. Pt. was able to perform translatory movements moving the washers to her palm, and from her palm to the tip of her 2nd digit, and thumb in preparation for placing them over the dowels. Emphasis was placed on accurately reaching the target.  Selfcare:  Pt. worked on Radiographer, therapeutic. Pt. formulated all words in all large capital letters with 75% legibility using a standard pen. Pt. presented with no deviation below the line.   Pt. has made excellent progress with writing, and is now able to formulate letters in all caps legibly. Pt.  was able to accurately place the cubes, and washers on the targets. Pt.  was able to perform translatory skills, and move one washer through her hand at a time. Pt. has difficulty moving  more than one washer through her hand. Pt. Continues to work on improving her dominant RUE motor control, and [facility] skills in order to work towards improving, and maximizing independence with ADLs, and IADLs.                      OT Education - 12/16/20 1613     Education Details HEP progression    Person(s) Educated Patient    Methods Explanation;Verbal cues;Demonstration    Comprehension Verbalized understanding;Need further instruction              OT Short Term Goals - 12/04/20 1254       OT SHORT TERM GOAL #1   Title Pt will perform HEP for RUE strength/coordination independently.    Baseline Eval: HEP not yet established in outpatient, but pt is working with putty from [name]; 12/04/2020; [facility] HEP initiated this day with mod vc after return demo.    Time 6    Period Weeks    Status On-going    Target Date 12/21/20               OT Long Term Goals - 12/04/20 1255       OT LONG TERM GOAL #1   Title Pt will improve hand writing to 100% legibility with R hand to be able to independently write a check.    Baseline Eval: signature is 75% legible with R hand, requiring extra time; 12/04/2020: no change from eval    Time 12    Period Weeks    Status On-going    Target Date 02/01/21      OT LONG TERM GOAL #2   Title Pt will improve R hand coordination to enable good manipulation of iphone using R dominant hand    Baseline Eval: Pt uses L hand d/t R hand lacking sufficient FMC to press buttons on phone; 12/04/2020: no change from eval    Time 12    Period Weeks    Status On-going    Target Date 02/01/21      OT LONG TERM GOAL #3   Title Pt will improve GMC throughout RUE to enable pt to reach for and pick up ADL supplies with R dominant hand without dropping or knocking over objects.    Baseline Eval: Pt reports RUE is clumsy and easily knocks  over objects when trying to reach for ADL supplies.  Pt verbalizes that her "aim is off."; 12/04/2020: pt reports slight improvement since eval and has started using her R hand to eat, but still feels clumsy.    Time 12    Period Weeks    Status  On-going    Target Date 02/01/21      OT LONG TERM GOAL #5   Title Pt will perform dynamic standing tasks with modified indep, AD as needed in order to reduce fall risk with ADLs    Baseline Eval: CGA-min A for all dynamic standing tasks using RW; 12/04/2020: pt is using RW in home with distant supv, but family still provides close supv on stairs    Time 12    Period Weeks    Status On-going    Target Date 02/01/21                   Plan - 12/16/20 1614     Clinical Impression Statement Pt. has made excellent progress with writing, and is now able to formulate letters in all caps legibly. Pt.  was able to accurately place the cubes, and washers on the targets. Pt. was able to perform translatory skills, and move one washer through her hand at a time. Pt. has difficulty moving  more than one washer through her hand. Pt. Continues to work on improving her dominant RUE motor control, and [facility] skills in order to work towards improving, and maximizing independence with ADLs, and IADLs.    OT Occupational Profile and History Detailed Assessment- Review of Records and additional review of physical, cognitive, psychosocial history related to current functional performance    Occupational performance deficits (Please refer to evaluation for details): ADL's;Leisure;IADL's    Body Structure / Function / Physical Skills ADL;Coordination;Endurance;GMC;UE functional use;Balance;Sensation;Body mechanics;IADL;Pain;Dexterity;FMC;Strength;Gait;Mobility    Rehab Potential Good    Clinical Decision Making Several treatment options, min-mod task modification necessary    Comorbidities Affecting Occupational Performance: May have comorbidities impacting occupational  performance    Modification or Assistance to Complete Evaluation  Min-Moderate modification of tasks or assist with assess necessary to complete eval    OT Frequency 2x / week    OT Duration 12 weeks    OT Treatment/Interventions Self-care/ADL training;Therapeutic exercise;DME and/or AE instruction;Functional Mobility Training;Balance training;Neuromuscular education;Therapeutic activities;Patient/family education    Consulted and Agree with Plan of Care Patient             Patient will benefit from skilled therapeutic intervention in order to improve the following deficits and impairments:   Body Structure / Function / Physical Skills: ADL, Coordination, Endurance, GMC, UE functional use, Balance, Sensation, Body mechanics, IADL, Pain, Dexterity, FMC, Strength, Gait, Mobility       Visit Diagnosis: Muscle weakness (generalized)  Other lack of coordination    Problem List Patient Active Problem List   Diagnosis Date Noted   Carotid stenosis, symptomatic, with infarction (HCC) 11/13/2020   Gout flare 11/10/2020   UTI (urinary tract infection) 11/10/2020   Left carotid artery stenosis 11/10/2020   Chronic kidney disease 11/10/2020   Left basal ganglia embolic stroke (HCC) 10/24/2020   PAC (premature atrial contraction)    Pre-procedural cardiovascular examination    Ischemic stroke (HCC) 10/20/2020   TIA (transient ischemic attack) 10/19/2020   Right hemiparesis (HCC) 10/19/2020   Type 2 diabetes mellitus with stage 3 chronic kidney disease, without long-term current use of insulin (HCC) 08/24/2018   Hyperlipidemia, unspecified 07/19/2017   Hypertension 07/19/2017   PUD (peptic ulcer disease) 07/19/2017   Type 2 diabetes mellitus (HCC) 07/19/2017   Personal history of gout 08/12/2016   Anemia, unspecified 04/09/2016   Hypothyroidism, acquired 12/10/2015    Olegario MessierElaine Nedra Mcinnis, MS, OTR/L 12/16/2020, 4:16 PM  [place] [facility] MAIN  [facility]  SERVICES [address] [place], [place], [address] Phone: [phone]   Fax:  [phone]  Name: Cassandra Schaefer MRN: [id number] Date of Birth: 11/28/1928

## 2020-12-18 ENCOUNTER — Ambulatory Visit: Payer: Medicare HMO

## 2020-12-18 ENCOUNTER — Ambulatory Visit: Payer: Medicare HMO | Admitting: Speech Pathology

## 2020-12-18 ENCOUNTER — Ambulatory Visit: Payer: Medicare HMO | Admitting: Physical Therapy

## 2020-12-18 NOTE — Therapy (Signed)
[place] [facility] MAIN [facility] SERVICES [address] [place], [place], [address] Phone: [phone]   Fax:  [phone]  Speech Language Pathology Treatment  Patient Details  Name: Cassandra Schaefer MRN: [id number] Date of Birth: [birth year] Referring Provider (SLP): Barbette Reichmann   Encounter Date: 12/16/2020   End of Session - 12/18/20 0842     Visit Number 4    Number of Visits 25    Date for SLP Re-Evaluation 02/27/21    Authorization Type [employer]    Authorization Time Period 12/04/2020 thru 02/27/2021    Authorization - Visit Number 4    Progress Note Due on Visit 10    SLP Start Time 1415    SLP Stop Time  1500    SLP Time Calculation (min) 45 min    Activity Tolerance Patient tolerated treatment well             Past Medical History:  Diagnosis Date   Anemia    Cough    RESOLVING, NO FEVER   Diabetes mellitus without complication (HCC)    Gout    Hypertension    Hypothyroidism    PUD (peptic ulcer disease)    Stroke [facility])    Wears dentures    full upper, partial lower    Past Surgical History:  Procedure Laterality Date   AMPUTATION TOE Left 12/14/2017   Procedure: AMPUTATION TOE-4TH MPJ;  Surgeon: Gwyneth Revels, DPM;  Location: [facility];  Service: Podiatry;  Laterality: Left;  IVA LOCAL Diabetic - oral meds   APPENDECTOMY     CATARACT EXTRACTION W/PHACO Right 06/23/2015   Procedure: CATARACT EXTRACTION PHACO AND INTRAOCULAR LENS PLACEMENT (IOC);  Surgeon: Sallee Lange, MD;  Location: [facility] OR[facility];  Service: Ophthalmology;  Laterality: Right;  [place] 01:28 AP% 23.4 CDE 36.14 fluid pack lot # [id number]   CATARACT EXTRACTION W/PHACO Left 07/28/2015   Procedure: CATARACT EXTRACTION PHACO AND INTRAOCULAR LENS PLACEMENT (IOC);  Surgeon: Sallee Lange, MD;  Location: [facility] OR[facility];  Service: Ophthalmology;  Laterality: Left;  [place]    1:29.7 AP%  24.9 CDE   41.32 fluid casette lot #[id number] H exp 09/23/2016   COONOSCOPY      AND ENDOSCOPY   DILATION AND CURETTAGE OF UTERUS     ENDARTERECTOMY Left 11/13/2020   Procedure: Evacuation of left neck hematoma;  Surgeon: Renford Dills, MD;  Location: [facility] OR[facility];  Service: Vascular;  Laterality: Left;   ENDARTERECTOMY Left 11/13/2020   Procedure: ENDARTERECTOMY CAROTID;  Surgeon: Annice Needy, MD;  Location: [facility] OR[facility];  Service: Vascular;  Laterality: Left;   TONSILLECTOMY     TUBAL LIGATION      There were no vitals filed for this visit.   Subjective Assessment - 12/18/20 0839     Subjective "French Ana is always alittle late"    Currently in Pain? No/denies                   ADULT SLP TREATMENT - 12/18/20 0001       Treatment Provided   Treatment provided Cognitive-Linquistic      Cognitive-Linquistic Treatment   Treatment focused on Aphasia;Dysarthria;Patient/family/caregiver education    Skilled Treatment DYSARHTRIA: 65% intelligibility reading words containing s-blends, improving to 90% with minimal assistance; 50% intelligibility reading sentences containing s-blends, improving to 90% with moderate cues to slow rate              SLP Education - 12/18/20 0842     Education Details speech intelligibility strategies  Person(s) Educated Patient    Methods Explanation;Demonstration;Verbal cues;Handout;Tactile cues    Comprehension Verbalized understanding;Returned demonstration;Need further instruction              SLP Short Term Goals - 12/05/20 1542       SLP SHORT TERM GOAL #1   Title Pt will list 10 items within semi-complex category over 3 consecutive sessions.    Baseline new goal    Time 10   sessions   Status New      SLP SHORT TERM GOAL #2   Title With supervision cues, pt will utilize speech intelligibility strategies at the connected sentence level to achieve > 95% speech intelligibility.    Baseline new goal    Time 10    Period --   sessions   Status New      SLP SHORT TERM GOAL #3   Title Pt will read at  the paragraph level with > 75% speech intelligibility.    Baseline new goal    Time 10    Period --   session   Status New              SLP Long Term Goals - 12/05/20 1544       SLP LONG TERM GOAL #1   Title Pt will utilize word finding strategies to effectively communicate abstract/coomplex ideas/information.    Baseline new goal    Time 12    Period Weeks    Status New    Target Date 02/27/21      SLP LONG TERM GOAL #2   Title Pt will speech intelligibility strategies to achieve > 95% speech intelligibility during complex conversation.    Baseline new goal    Period Weeks    Status New    Target Date 02/27/21              Plan - 12/18/20 0842     Clinical Impression Statement Pt presents with moderate dysarthria that is c/b by fast rate, imprecise articulation, reduced vocal intensity and some deletion of suffixes, cluster reduction. As a result, pt's speech intelligibility is reduced to 50% at the conversation level within a mildly noisy environment. Skilled ST is required to improve pt's functional communication, reduce caregiver burden and return pt to church and community activities.    Speech Therapy Frequency 2x / week    Duration 12 weeks    Treatment/Interventions Language facilitation;Functional tasks;SLP instruction and feedback;Patient/family education    Potential to Achieve Goals Good    SLP Home Exercise Plan provided, see pt instruction section    Consulted and Agree with Plan of Care Patient             Patient will benefit from skilled therapeutic intervention in order to improve the following deficits and impairments:   Dysarthria and anarthria  Aphasia  Left basal ganglia embolic stroke [facility])    Problem List Patient Active Problem List   Diagnosis Date Noted   Carotid stenosis, symptomatic, with infarction (HCC) 11/13/2020   Gout flare 11/10/2020   UTI (urinary tract infection) 11/10/2020   Left carotid artery stenosis 11/10/2020    Chronic kidney disease 11/10/2020   Left basal ganglia embolic stroke (HCC) 10/24/2020   PAC (premature atrial contraction)    Pre-procedural cardiovascular examination    Ischemic stroke (HCC) 10/20/2020   TIA (transient ischemic attack) 10/19/2020   Right hemiparesis (HCC) 10/19/2020   Type 2 diabetes mellitus with stage 3 chronic kidney disease, without long-term current use  of insulin (HCC) 08/24/2018   Hyperlipidemia, unspecified 07/19/2017   Hypertension 07/19/2017   PUD (peptic ulcer disease) 07/19/2017   Type 2 diabetes mellitus (HCC) 07/19/2017   Personal history of gout 08/12/2016   Anemia, unspecified 04/09/2016   Hypothyroidism, acquired 12/10/2015   Breck Hollinger B. Dreama Saa M.S., CCC-SLP, [facility] Speech-Language Pathologist Rehabilitation Services Office [phone]  [name] 12/18/2020, 8:43 AM  [place] [facility] MAIN [facility] SERVICES [address] [place], [place], [address] Phone: [phone]   Fax:  [phone]   Name: Cassandra Schaefer MRN: [id number] Date of Birth: 1928-05-12

## 2020-12-18 NOTE — Patient Instructions (Signed)
Using a slow rate, read word/sentence lists

## 2020-12-22 ENCOUNTER — Ambulatory Visit: Payer: Medicare HMO | Admitting: Physical Therapy

## 2020-12-22 ENCOUNTER — Encounter: Payer: Medicare HMO | Admitting: Occupational Therapy

## 2020-12-23 ENCOUNTER — Ambulatory Visit: Payer: Medicare HMO | Admitting: Physical Therapy

## 2020-12-23 ENCOUNTER — Other Ambulatory Visit: Payer: Self-pay

## 2020-12-23 ENCOUNTER — Ambulatory Visit: Payer: Medicare HMO

## 2020-12-23 ENCOUNTER — Ambulatory Visit: Payer: Medicare HMO | Admitting: Speech Pathology

## 2020-12-23 ENCOUNTER — Encounter: Payer: Self-pay | Admitting: Physical Therapy

## 2020-12-23 DIAGNOSIS — R2681 Unsteadiness on feet: Secondary | ICD-10-CM | POA: Diagnosis not present

## 2020-12-23 DIAGNOSIS — M6281 Muscle weakness (generalized): Secondary | ICD-10-CM

## 2020-12-23 DIAGNOSIS — R2689 Other abnormalities of gait and mobility: Secondary | ICD-10-CM

## 2020-12-23 DIAGNOSIS — I639 Cerebral infarction, unspecified: Secondary | ICD-10-CM

## 2020-12-23 DIAGNOSIS — R278 Other lack of coordination: Secondary | ICD-10-CM

## 2020-12-23 DIAGNOSIS — R471 Dysarthria and anarthria: Secondary | ICD-10-CM

## 2020-12-23 DIAGNOSIS — R4701 Aphasia: Secondary | ICD-10-CM

## 2020-12-23 NOTE — Patient Instructions (Signed)
Using slow rate read word lists /s-blends/

## 2020-12-23 NOTE — Therapy (Signed)
[place] [facility] MAIN [facility] SERVICES [address] [place], [place], [address] Phone: [phone]   Fax:  [phone]  Physical Therapy Treatment  Patient Details  Name: Cassandra Schaefer MRN: [id number] Date of Birth: [birth year] Referring Provider (PT): Dr. Dew/Dr. Marcello Fennel PCP   Encounter Date: 12/23/2020   PT End of Session - 12/23/20 1141     Visit Number 5    Number of Visits 17    Date for PT Re-Evaluation 01/28/21    Authorization Type Medicare    Authorization Time Period 12/02/20 start of care    PT Start Time 1142    PT Stop Time 1225    PT Time Calculation (min) 43 min    Equipment Utilized During Treatment Gait belt    Activity Tolerance Patient tolerated treatment well    Behavior During Therapy WFL for tasks assessed/performed             Past Medical History:  Diagnosis Date   Anemia    Cough    RESOLVING, NO FEVER   Diabetes mellitus without complication (HCC)    Gout    Hypertension    Hypothyroidism    PUD (peptic ulcer disease)    Stroke [facility])    Wears dentures    full upper, partial lower    Past Surgical History:  Procedure Laterality Date   AMPUTATION TOE Left 12/14/2017   Procedure: AMPUTATION TOE-4TH MPJ;  Surgeon: Gwyneth Revels, DPM;  Location: [facility];  Service: Podiatry;  Laterality: Left;  IVA LOCAL Diabetic - oral meds   APPENDECTOMY     CATARACT EXTRACTION W/PHACO Right 06/23/2015   Procedure: CATARACT EXTRACTION PHACO AND INTRAOCULAR LENS PLACEMENT (IOC);  Surgeon: Sallee Lange, MD;  Location: [facility];  Service: Ophthalmology;  Laterality: Right;  [place] 01:28 AP% 23.4 CDE 36.14 fluid pack lot # [id number]   CATARACT EXTRACTION W/PHACO Left 07/28/2015   Procedure: CATARACT EXTRACTION PHACO AND INTRAOCULAR LENS PLACEMENT (IOC);  Surgeon: Sallee Lange, MD;  Location: [facility] OR[facility];  Service: Ophthalmology;  Laterality: Left;  [place]    1:29.7 AP%  24.9 CDE   41.32 fluid casette lot #[id number] H exp  09/23/2016   COONOSCOPY     AND ENDOSCOPY   DILATION AND CURETTAGE OF UTERUS     ENDARTERECTOMY Left 11/13/2020   Procedure: Evacuation of left neck hematoma;  Surgeon: Renford Dills, MD;  Location: [facility] OR[facility];  Service: Vascular;  Laterality: Left;   ENDARTERECTOMY Left 11/13/2020   Procedure: ENDARTERECTOMY CAROTID;  Surgeon: Annice Needy, MD;  Location: [facility] OR[facility];  Service: Vascular;  Laterality: Left;   TONSILLECTOMY     TUBAL LIGATION      There were no vitals filed for this visit.   Subjective Assessment - 12/23/20 1146     Subjective Patient reports feeling better. She was sick last week. She is better now. No pain currently; Reports not doing much activity as she wasn't feeling well.    Patient is accompained by: Family member   Daughter, [name]   Pertinent History 87 y.o. female with medical history significant for type 2 diabetes mellitus, essential hypertension, acquired hypothyroidism, hyperlipidemia, stage III chronic kidney disease reports increased right sided weakness on 10/19/20. She was diagnosed with left basal ganglia ischemic stroke. She was discharged to inpatient rehab for 10 days and then discharged home. Patient was evaluated by outpatient PT on 11/12/20. CVA was due to carotid stenosis. She underwent left carotid endartectomy on 7/21. Procedure went well however patient  suffered from left cervical hematoma and had subsequent surgical procedure to stop the bleed and remove the hematoma on 11/13/20. They were concerned with her breathing and therefore intubated her for 1 day, she was in ICU for 2 days and transferred to med/surge on 11/15/20. Patient was discharged home on 11/17/20 and is now returning to outpatient PT. She has had the stiches removed and is healing well. Denies any neck pain or stiffness. She lives with her daughter and has 24/7 caregiver support. She is still using RW for most ambulation. She is able to transfer to bedside commode well and is able to stand short  time unsupported. She reports feeling very weak and fatigued. She reports she has been dragging her right foot more. She denies any numbness/tingling; She reports feeling like her legs are wanting to do more but is hesitant because of fear of falling. She is mod I for self care morning routine. She does still receive help with showering. She has been working on increasing her activity but denies any formal exercise.    Limitations Standing;Walking    How long can you sit comfortably? NA    How long can you stand comfortably? 30-60 min with support    How long can you walk comfortably? about 100 feet with RW, still limited to short distances;    Diagnostic tests MRI shows left basal ganglia infarct 10/21/20    Patient Stated Goals "Improve balance and walking and not need RW, get back to independent."    Currently in Pain? No/denies    Pain Onset In the past 7 days    Multiple Pain Sites No                TREATMENT: Standing in parallel bars: 2# ankle weight: -alternate march x10 reps each LE -BLE heel raises x10 reps -hip extension SLR x10 reps with mod VCs to keep knee extended for better hip strengthening -hip abduction SLR x10 reps each with BUE rail assist with cues to avoid hip ER for better LE strengthening;  Patient required min-moderate verbal/tactile cues for correct exercise technique.   Seated 2# ankle weight: LAQ with ankle DF x10 reps each LE with cues to increase terminal knee extension;  Seated ankle DF red tband x20 reps, patient reports minimal difficulty and is able to achieve full ROM Progressed to seated ankle DF green tband x15 reps, RLE only; Patient reports medium difficulty. Provided green tband for patient to use at home for LE ankle strengthening;    Standing in parallel bars: -forward step up on 6 inch step with 2 rail assist x10 reps each LE; Pt exhibits increased foot drag on RLE with increased difficulty clearing foot; Required min VCS to increase hip  flexion and avoid whipping foot around;  Side step up and over 6 inch step x3 reps each direction with 2 rail assist;   Gait around gym x150 feet with RW; initially patient able to exhibit good heel strike on RLE with better reciprocal gait pattern. However with increased distance, does drag RLE foot with decreased foot clearance;    NMR: Standing on airex pad:             Feet together eyes open 30 sec hold, eyes closed 15 sec hold x2 reps each; Patient required CGA to min A for balance recovery with increased loss of balance to left side with prolonged standing;              Modified tandem stance:  Unsupported 10 sec hold x2 reps each foot in front;  Progressed to unsupported standing with head turns side/side x5 reps each foot in front;  Patient required min A with balance exercise with increased instability noted with narrow base of support. She had a harder time standing with right foot on airex and left foot on step due to weakness in RLE;   Patient tolerated session well. She does report increased fatigue at end of session. She required CGA to min A throughout session for safety;                       PT Education - 12/23/20 1140     Education Details exercise technique/posture    Person(s) Educated Patient    Methods Explanation;Verbal cues    Comprehension Verbalized understanding;Returned demonstration;Verbal cues required;Need further instruction              PT Short Term Goals - 12/03/20 0827       PT SHORT TERM GOAL #1   Title Patient will be adherent to HEP at least 3x a week to improve functional strength and balance for better safety at home.    Time 4    Period Weeks    Status New    Target Date 12/31/20      PT SHORT TERM GOAL #2   Title Patient will be independent in transferring sit<>Stand without pushing on arm rests to improve ability to get up from chair.    Time 4    Period Weeks    Status New    Target Date 12/31/20                PT Long Term Goals - 12/03/20 0828       PT LONG TERM GOAL #1   Title Patient (> 32 years old) will complete five times sit to stand test in < 15 seconds indicating an increased LE strength and improved balance.    Time 8    Period Weeks    Status New    Target Date 01/28/21      PT LONG TERM GOAL #2   Title Patient will increase six minute walk test distance to >400 feet for improve gait ability    Time 8    Period Weeks    Status New    Target Date 01/28/21      PT LONG TERM GOAL #3   Title Patient will increase 10 meter walk test to >1.25m/s as to improve gait speed for better community ambulation and to reduce fall risk.    Time 8    Period Weeks    Status New    Target Date 01/28/21      PT LONG TERM GOAL #4   Title Patient will be independent with ascend/descend 6 steps using single UE in step over step pattern without LOB.    Time 8    Period Weeks    Status New    Target Date 01/28/21      PT LONG TERM GOAL #5   Title Patient will demonstrate an improved Berg Balance Score of >45/56 as to demonstrate improved balance with ADLs such as sitting/standing and transfer balance and reduced fall risk.    Time 8    Period Weeks    Status New    Target Date 01/28/21      PT LONG TERM GOAL #6   Title Patient will improve FOTO score to >60% to indicate  improved functional mobility with ADLs.    Time 8    Period Weeks    Status New    Target Date 01/28/21                   Plan - 12/23/20 1304     Clinical Impression Statement Patient more fatigued today due to PT being 3rd session as well as recently being sick with stomach  bug. She was motivated and participated well within session. Instructed patient in advanced LE strengthening exercise. Patient does require min VCs for proper positioning and exercise technique. She did require short seated rest break with prolonged standing due to fatigue. Progressed ankle DF strengthening with  increased resistance. Pt able to exhibit better foot clearance with improved heel strike during ambulation. However with fatigue does exhibit increased foot drag. Patient instructed in balance activities. She does exhibit increased unsteadiness when standing on compliant surface with eyes closed and with narrow base of support. She would benefit from additional skilled PT intervention to improve strength, balance and mobility;    Personal Factors and Comorbidities Age;Comorbidity 3+;Fitness    Comorbidities HTN, CKD, fall risk, type 2 diabetes,    Examination-Activity Limitations Lift;Locomotion Level;Squat;Stairs;Stand;Toileting;Transfers;Bathing    Examination-Participation Restrictions Cleaning;Community Activity;Laundry;Meal Prep;Shop;Volunteer;Driving    Stability/Clinical Decision Making Stable/Uncomplicated    PT Frequency 2x / week    PT Duration 8 weeks    PT Treatment/Interventions Cryotherapy;Electrical Stimulation;Moist Heat;Gait training;Stair training;Functional mobility training;Therapeutic activities;Therapeutic exercise;Neuromuscular re-education;Balance training;Patient/family education;Orthotic Fit/Training;Energy conservation    PT Next Visit Plan work on LE strengthening and balance;    PT Home Exercise Plan Initiated, see patient instructions    Consulted and Agree with Plan of Care Patient             Patient will benefit from skilled therapeutic intervention in order to improve the following deficits and impairments:  Abnormal gait, Decreased balance, Decreased endurance, Decreased mobility, Difficulty walking, Cardiopulmonary status limiting activity, Decreased activity tolerance, Decreased coordination, Decreased strength  Visit Diagnosis: Muscle weakness (generalized)  Other lack of coordination  Unsteadiness on feet  Other abnormalities of gait and mobility     Problem List Patient Active Problem List   Diagnosis Date Noted   Carotid stenosis,  symptomatic, with infarction (HCC) 11/13/2020   Gout flare 11/10/2020   UTI (urinary tract infection) 11/10/2020   Left carotid artery stenosis 11/10/2020   Chronic kidney disease 11/10/2020   Left basal ganglia embolic stroke (HCC) 10/24/2020   PAC (premature atrial contraction)    Pre-procedural cardiovascular examination    Ischemic stroke (HCC) 10/20/2020   TIA (transient ischemic attack) 10/19/2020   Right hemiparesis (HCC) 10/19/2020   Type 2 diabetes mellitus with stage 3 chronic kidney disease, without long-term current use of insulin (HCC) 08/24/2018   Hyperlipidemia, unspecified 07/19/2017   Hypertension 07/19/2017   PUD (peptic ulcer disease) 07/19/2017   Type 2 diabetes mellitus (HCC) 07/19/2017   Personal history of gout 08/12/2016   Anemia, unspecified 04/09/2016   Hypothyroidism, acquired 12/10/2015    Olden Klauer PT, DPT 12/23/2020, 1:20 PM  [place] [facility] MAIN [facility] SERVICES [address] [place], [place], [address] Phone: [phone]   Fax:  [phone]  Name: Cassandra Schaefer MRN: [id number] Date of Birth: June 19, 1928

## 2020-12-23 NOTE — Therapy (Signed)
[facility] MAIN [facility] SERVICES [address] [place], [place], [address] Phone: [phone]   Fax:  [phone]  Occupational Therapy Treatment  Patient Details  Name: Cassandra Schaefer MRN: [id number] Date of Birth: [birth year] Referring Provider (OT): Dr. Barbette Reichmann   Encounter Date: 12/23/2020   OT End of Session - 12/23/20 0917     Visit Number 6    Number of Visits 24    Date for OT Re-Evaluation 02/01/21    Authorization Time Period Reporting period starting 11/10/2020    OT Start Time 0904    OT Stop Time 0949    OT Time Calculation (min) 45 min    Equipment Utilized During Treatment wc    Activity Tolerance Patient tolerated treatment well    Behavior During Therapy WFL for tasks assessed/performed             Past Medical History:  Diagnosis Date   Anemia    Cough    RESOLVING, NO FEVER   Diabetes mellitus without complication (HCC)    Gout    Hypertension    Hypothyroidism    PUD (peptic ulcer disease)    Stroke [facility])    Wears dentures    full upper, partial lower    Past Surgical History:  Procedure Laterality Date   AMPUTATION TOE Left 12/14/2017   Procedure: AMPUTATION TOE-4TH MPJ;  Surgeon: Gwyneth Revels, DPM;  Location: [facility];  Service: Podiatry;  Laterality: Left;  IVA LOCAL Diabetic - oral meds   APPENDECTOMY     CATARACT EXTRACTION W/PHACO Right 06/23/2015   Procedure: CATARACT EXTRACTION PHACO AND INTRAOCULAR LENS PLACEMENT (IOC);  Surgeon: Sallee Lange, MD;  Location: [facility];  Service: Ophthalmology;  Laterality: Right;  [place] 01:28 AP% 23.4 CDE 36.14 fluid pack lot # [id number]   CATARACT EXTRACTION W/PHACO Left 07/28/2015   Procedure: CATARACT EXTRACTION PHACO AND INTRAOCULAR LENS PLACEMENT (IOC);  Surgeon: Sallee Lange, MD;  Location: [facility];  Service: Ophthalmology;  Laterality: Left;  [place]    1:29.7 AP%  24.9 CDE   41.32 fluid casette lot #[id number] H exp 09/23/2016    COONOSCOPY     AND ENDOSCOPY   DILATION AND CURETTAGE OF UTERUS     ENDARTERECTOMY Left 11/13/2020   Procedure: Evacuation of left neck hematoma;  Surgeon: Renford Dills, MD;  Location: [facility];  Service: Vascular;  Laterality: Left;   ENDARTERECTOMY Left 11/13/2020   Procedure: ENDARTERECTOMY CAROTID;  Surgeon: Annice Needy, MD;  Location: [facility];  Service: Vascular;  Laterality: Left;   TONSILLECTOMY     TUBAL LIGATION      There were no vitals filed for this visit.   Subjective Assessment - 12/23/20 0912     Subjective  "I'm feeling better from the diarrhea but now I have gout in my L foot."    Patient is accompanied by: Family member    Pertinent History R ankle pain, gout, T2DM, HTN, CKDIII; R/L carotid endarectomy    Patient Stated Goals "I want to improve my aim when I'm using my R arm."    Currently in Pain? Yes    Pain Score 5     Pain Location Toe (Comment which one)   L great toe   Pain Orientation Left    Pain Descriptors / Indicators Pins and needles    Pain Type Acute pain    Pain Onset In the past 7 days    Pain Frequency Intermittent    Aggravating  Factors  walking    Pain Relieving Factors gout med    Effect of Pain on Daily Activities pain with walking    Multiple Pain Sites No            Occupational Therapy Treatment: Neuro re-ed: Participation in RUE coordination retraining using jumbo pegs, placing into peg board, removing pegs with hand gripper set at 11.2 lbs x2 trials, 6.6 lbs on 3rd trial, worked grasping, storing, discarding pegs from palm of R hand, and rotating pegs in hand to address digit isolation.    Response to Treatment: See plan/clinical impression below   OT Education - 12/23/20 0916     Education Details HEP progression    Person(s) Educated Patient    Methods Explanation;Verbal cues;Demonstration    Comprehension Verbalized understanding;Need further instruction              OT Short Term Goals - 12/04/20 1254        OT SHORT TERM GOAL #1   Title Pt will perform HEP for RUE strength/coordination independently.    Baseline Eval: HEP not yet established in outpatient, but pt is working with putty from [name]; 12/04/2020; [facility] HEP initiated this day with mod vc after return demo.    Time 6    Period Weeks    Status On-going    Target Date 12/21/20               OT Long Term Goals - 12/04/20 1255       OT LONG TERM GOAL #1   Title Pt will improve hand writing to 100% legibility with R hand to be able to independently write a check.    Baseline Eval: signature is 75% legible with R hand, requiring extra time; 12/04/2020: no change from eval    Time 12    Period Weeks    Status On-going    Target Date 02/01/21      OT LONG TERM GOAL #2   Title Pt will improve R hand coordination to enable good manipulation of iphone using R dominant hand    Baseline Eval: Pt uses L hand d/t R hand lacking sufficient FMC to press buttons on phone; 12/04/2020: no change from eval    Time 12    Period Weeks    Status On-going    Target Date 02/01/21      OT LONG TERM GOAL #3   Title Pt will improve GMC throughout RUE to enable pt to reach for and pick up ADL supplies with R dominant hand without dropping or knocking over objects.    Baseline Eval: Pt reports RUE is clumsy and easily knocks over objects when trying to reach for ADL supplies.  Pt verbalizes that her "aim is off."; 12/04/2020: pt reports slight improvement since eval and has started using her R hand to eat, but still feels clumsy.    Time 12    Period Weeks    Status On-going    Target Date 02/01/21      OT LONG TERM GOAL #5   Title Pt will perform dynamic standing tasks with modified indep, AD as needed in order to reduce fall risk with ADLs    Baseline Eval: CGA-min A for all dynamic standing tasks using RW; 12/04/2020: pt is using RW in home with distant supv, but family still provides close supv on stairs    Time 12    Period Weeks    Status  On-going    Target Date  02/01/21                Plan - 12/23/20 1253     Clinical Impression Statement Pt was sick last week with diarrhea x 3 days, also just restarted colchicine for gout in L foot.  Pt is feeling better but presents with slight decline today in RUE motor planning, somewhat more sluggish with jumbo pegs.  Frequent rest breaks needed when engaging RUE in strength and coordination tasks.  Pt has difficulty with digit isolation in R hand, extra time to rotate pegs in hand and compensated intermittently with wrist and forearm movements.  Intermittently dropped pegs with hand gripper set at 11.2 lbs, and required extra time with storage and release of pegs in R hand.  Pt will continue to benefit from skilled OT to address weakness, apraxia, and ataxia throughout RUE in order to improve self care efficiency when engaging RUE.    OT Occupational Profile and History Detailed Assessment- Review of Records and additional review of physical, cognitive, psychosocial history related to current functional performance    Occupational performance deficits (Please refer to evaluation for details): ADL's;Leisure;IADL's    Body Structure / Function / Physical Skills ADL;Coordination;Endurance;GMC;UE functional use;Balance;Sensation;Body mechanics;IADL;Pain;Dexterity;FMC;Strength;Gait;Mobility    Rehab Potential Good    Clinical Decision Making Several treatment options, min-mod task modification necessary    Comorbidities Affecting Occupational Performance: May have comorbidities impacting occupational performance    Modification or Assistance to Complete Evaluation  Min-Moderate modification of tasks or assist with assess necessary to complete eval    OT Frequency 2x / week    OT Duration 12 weeks    OT Treatment/Interventions Self-care/ADL training;Therapeutic exercise;DME and/or AE instruction;Functional Mobility Training;Balance training;Neuromuscular education;Therapeutic  activities;Patient/family education    Consulted and Agree with Plan of Care Patient             Patient will benefit from skilled therapeutic intervention in order to improve the following deficits and impairments:   Body Structure / Function / Physical Skills: ADL, Coordination, Endurance, GMC, UE functional use, Balance, Sensation, Body mechanics, IADL, Pain, Dexterity, FMC, Strength, Gait, Mobility       Visit Diagnosis: Muscle weakness (generalized)  Other lack of coordination  Left basal ganglia embolic stroke [facility])    Problem List Patient Active Problem List   Diagnosis Date Noted   Carotid stenosis, symptomatic, with infarction (HCC) 11/13/2020   Gout flare 11/10/2020   UTI (urinary tract infection) 11/10/2020   Left carotid artery stenosis 11/10/2020   Chronic kidney disease 11/10/2020   Left basal ganglia embolic stroke (HCC) 10/24/2020   PAC (premature atrial contraction)    Pre-procedural cardiovascular examination    Ischemic stroke (HCC) 10/20/2020   TIA (transient ischemic attack) 10/19/2020   Right hemiparesis (HCC) 10/19/2020   Type 2 diabetes mellitus with stage 3 chronic kidney disease, without long-term current use of insulin (HCC) 08/24/2018   Hyperlipidemia, unspecified 07/19/2017   Hypertension 07/19/2017   PUD (peptic ulcer disease) 07/19/2017   Type 2 diabetes mellitus (HCC) 07/19/2017   Personal history of gout 08/12/2016   Anemia, unspecified 04/09/2016   Hypothyroidism, acquired 12/10/2015   Danelle Earthly, MS, OTR/L  Otis Dials 12/23/2020, 12:54 PM  [facility] MAIN [facility] SERVICES [address] [place], [place], [address] Phone: [phone]   Fax:  [phone]  Name: Cassandra Schaefer MRN: [id number] Date of Birth: 02-22-1929

## 2020-12-23 NOTE — Therapy (Signed)
[facility] MAIN [facility] SERVICES [address] [place], [place], [address] Phone: [phone]   Fax:  [phone]  Speech Language Pathology Treatment  Patient Details  Name: Cassandra Schaefer MRN: [id number] Date of Birth: 06-Oct-1928 Referring Provider (SLP): Barbette Reichmann   Encounter Date: 12/23/2020   End of Session - 12/23/20 1224     Visit Number 5    Number of Visits 25    Date for SLP Re-Evaluation 02/27/21    Authorization Type [employer]    Authorization Time Period 12/04/2020 thru 02/27/2021    Authorization - Visit Number 5    Progress Note Due on Visit 10    SLP Start Time 1000    SLP Stop Time  1100    SLP Time Calculation (min) 60 min    Activity Tolerance Patient tolerated treatment well             Past Medical History:  Diagnosis Date   Anemia    Cough    RESOLVING, NO FEVER   Diabetes mellitus without complication (HCC)    Gout    Hypertension    Hypothyroidism    PUD (peptic ulcer disease)    Stroke [facility])    Wears dentures    full upper, partial lower    Past Surgical History:  Procedure Laterality Date   AMPUTATION TOE Left 12/14/2017   Procedure: AMPUTATION TOE-4TH MPJ;  Surgeon: Gwyneth Revels, DPM;  Location: [facility];  Service: Podiatry;  Laterality: Left;  IVA LOCAL Diabetic - oral meds   APPENDECTOMY     CATARACT EXTRACTION W/PHACO Right 06/23/2015   Procedure: CATARACT EXTRACTION PHACO AND INTRAOCULAR LENS PLACEMENT (IOC);  Surgeon: Sallee Lange, MD;  Location: [facility] OR[facility];  Service: Ophthalmology;  Laterality: Right;  [place] 01:28 AP% 23.4 CDE 36.14 fluid pack lot # [id number]   CATARACT EXTRACTION W/PHACO Left 07/28/2015   Procedure: CATARACT EXTRACTION PHACO AND INTRAOCULAR LENS PLACEMENT (IOC);  Surgeon: Sallee Lange, MD;  Location: [facility];  Service: Ophthalmology;  Laterality: Left;  [place]    1:29.7 AP%  24.9 CDE   41.32 fluid casette lot #[id number] H exp 09/23/2016   COONOSCOPY      AND ENDOSCOPY   DILATION AND CURETTAGE OF UTERUS     ENDARTERECTOMY Left 11/13/2020   Procedure: Evacuation of left neck hematoma;  Surgeon: Renford Dills, MD;  Location: [facility];  Service: Vascular;  Laterality: Left;   ENDARTERECTOMY Left 11/13/2020   Procedure: ENDARTERECTOMY CAROTID;  Surgeon: Annice Needy, MD;  Location: [facility] ORS;  Service: Vascular;  Laterality: Left;   TONSILLECTOMY     TUBAL LIGATION      There were no vitals filed for this visit.   Subjective Assessment - 12/23/20 1100     Subjective "I am feeling so much better"    Currently in Pain? No/denies                   ADULT SLP TREATMENT - 12/23/20 0001       Treatment Provided   Treatment provided Cognitive-Linquistic      Cognitive-Linquistic Treatment   Treatment focused on Aphasia;Dysarthria;Patient/family/caregiver education    Skilled Treatment DYSARHTRIA: 95% intelligibility reading /sl/ words (p 66); 95%  /sl/ phrases & sentences (p67),  95% /spr/ words (p84); carryover intelligibility ~80% with intermittent cues for rate control              SLP Education - 12/23/20 1224     Education Details provided  Person(s) Educated Patient    Methods Explanation;Demonstration;Verbal cues    Comprehension Verbalized understanding;Returned demonstration;Need further instruction              SLP Short Term Goals - 12/05/20 1542       SLP SHORT TERM GOAL #1   Title Pt will list 10 items within semi-complex category over 3 consecutive sessions.    Baseline new goal    Time 10   sessions   Status New      SLP SHORT TERM GOAL #2   Title With supervision cues, pt will utilize speech intelligibility strategies at the connected sentence level to achieve > 95% speech intelligibility.    Baseline new goal    Time 10    Period --   sessions   Status New      SLP SHORT TERM GOAL #3   Title Pt will read at the paragraph level with > 75% speech intelligibility.    Baseline new  goal    Time 10    Period --   session   Status New              SLP Long Term Goals - 12/05/20 1544       SLP LONG TERM GOAL #1   Title Pt will utilize word finding strategies to effectively communicate abstract/coomplex ideas/information.    Baseline new goal    Time 12    Period Weeks    Status New    Target Date 02/27/21      SLP LONG TERM GOAL #2   Title Pt will speech intelligibility strategies to achieve > 95% speech intelligibility during complex conversation.    Baseline new goal    Period Weeks    Status New    Target Date 02/27/21              Plan - 12/23/20 1224     Clinical Impression Statement Pt presents with improving dysarthria and increased rate control of speech. As a result, pt's speech intelligibility is 60-75% intelligible at the conversation level within a mildly noisy environment. Skilled ST is required to improve pt's functional communication, reduce caregiver burden and return pt to church and community activities.    Speech Therapy Frequency 2x / week    Duration 12 weeks    Treatment/Interventions Language facilitation;Functional tasks;SLP instruction and feedback;Patient/family education    Potential to Achieve Goals Good    SLP Home Exercise Plan provided, see pt instruction section    Consulted and Agree with Plan of Care Patient             Patient will benefit from skilled therapeutic intervention in order to improve the following deficits and impairments:   Dysarthria and anarthria  Aphasia  Left basal ganglia embolic stroke [facility])    Problem List Patient Active Problem List   Diagnosis Date Noted   Carotid stenosis, symptomatic, with infarction (HCC) 11/13/2020   Gout flare 11/10/2020   UTI (urinary tract infection) 11/10/2020   Left carotid artery stenosis 11/10/2020   Chronic kidney disease 11/10/2020   Left basal ganglia embolic stroke (HCC) 10/24/2020   PAC (premature atrial contraction)    Pre-procedural  cardiovascular examination    Ischemic stroke (HCC) 10/20/2020   TIA (transient ischemic attack) 10/19/2020   Right hemiparesis (HCC) 10/19/2020   Type 2 diabetes mellitus with stage 3 chronic kidney disease, without long-term current use of insulin (HCC) 08/24/2018   Hyperlipidemia, unspecified 07/19/2017   Hypertension 07/19/2017  PUD (peptic ulcer disease) 07/19/2017   Type 2 diabetes mellitus (HCC) 07/19/2017   Personal history of gout 08/12/2016   Anemia, unspecified 04/09/2016   Hypothyroidism, acquired 12/10/2015   Jervon Ream B. Dreama Saa M.S., CCC-SLP, [facility] Speech-Language Pathologist Rehabilitation Services Office [phone]  [name] 12/23/2020, 12:26 PM  [facility] MAIN [facility] SERVICES [address] [place], [place], [address] Phone: [phone]   Fax:  [phone]   Name: Cassandra Schaefer MRN: [id number] Date of Birth: 07-11-1928

## 2020-12-24 ENCOUNTER — Ambulatory Visit: Payer: Medicare HMO

## 2020-12-24 ENCOUNTER — Encounter: Payer: Medicare HMO | Admitting: Occupational Therapy

## 2020-12-25 ENCOUNTER — Ambulatory Visit: Payer: Medicare HMO | Admitting: Occupational Therapy

## 2020-12-25 ENCOUNTER — Ambulatory Visit: Payer: Medicare HMO | Attending: Vascular Surgery | Admitting: Speech Pathology

## 2020-12-25 ENCOUNTER — Other Ambulatory Visit: Payer: Self-pay

## 2020-12-25 ENCOUNTER — Ambulatory Visit: Payer: Medicare HMO

## 2020-12-25 DIAGNOSIS — M6281 Muscle weakness (generalized): Secondary | ICD-10-CM | POA: Insufficient documentation

## 2020-12-25 DIAGNOSIS — R482 Apraxia: Secondary | ICD-10-CM | POA: Diagnosis present

## 2020-12-25 DIAGNOSIS — R2689 Other abnormalities of gait and mobility: Secondary | ICD-10-CM | POA: Diagnosis present

## 2020-12-25 DIAGNOSIS — R4701 Aphasia: Secondary | ICD-10-CM | POA: Insufficient documentation

## 2020-12-25 DIAGNOSIS — I639 Cerebral infarction, unspecified: Secondary | ICD-10-CM | POA: Insufficient documentation

## 2020-12-25 DIAGNOSIS — R471 Dysarthria and anarthria: Secondary | ICD-10-CM | POA: Diagnosis present

## 2020-12-25 DIAGNOSIS — R2681 Unsteadiness on feet: Secondary | ICD-10-CM

## 2020-12-25 DIAGNOSIS — R278 Other lack of coordination: Secondary | ICD-10-CM | POA: Diagnosis present

## 2020-12-25 NOTE — Therapy (Signed)
[place] [facility] MAIN [facility] SERVICES [address] [place], [place], [address] Phone: [phone]   Fax:  [phone]  Occupational Therapy Treatment  Patient Details  Name: Cassandra Schaefer MRN: [id number] Date of Birth: 1928-09-30 Referring Provider (OT): Dr. Barbette Reichmann   Encounter Date: 12/25/2020   OT End of Session - 12/25/20 1522     Visit Number 7    Number of Visits 24    Date for OT Re-Evaluation 02/01/21    Authorization Time Period Reporting period starting 11/10/2020    OT Start Time 1500    OT Stop Time 1545    OT Time Calculation (min) 45 min    Activity Tolerance Patient tolerated treatment well    Behavior During Therapy WFL for tasks assessed/performed             Past Medical History:  Diagnosis Date   Anemia    Cough    RESOLVING, NO FEVER   Diabetes mellitus without complication (HCC)    Gout    Hypertension    Hypothyroidism    PUD (peptic ulcer disease)    Stroke [facility])    Wears dentures    full upper, partial lower    Past Surgical History:  Procedure Laterality Date   AMPUTATION TOE Left 12/14/2017   Procedure: AMPUTATION TOE-4TH MPJ;  Surgeon: Gwyneth Revels, DPM;  Location: [facility];  Service: Podiatry;  Laterality: Left;  IVA LOCAL Diabetic - oral meds   APPENDECTOMY     CATARACT EXTRACTION W/PHACO Right 06/23/2015   Procedure: CATARACT EXTRACTION PHACO AND INTRAOCULAR LENS PLACEMENT (IOC);  Surgeon: Sallee Lange, MD;  Location: [facility] ORS;  Service: Ophthalmology;  Laterality: Right;  [place] 01:28 AP% 23.4 CDE 36.14 fluid pack lot # [id number]   CATARACT EXTRACTION W/PHACO Left 07/28/2015   Procedure: CATARACT EXTRACTION PHACO AND INTRAOCULAR LENS PLACEMENT (IOC);  Surgeon: Sallee Lange, MD;  Location: [facility] ORS;  Service: Ophthalmology;  Laterality: Left;  [place]    1:29.7 AP%  24.9 CDE   41.32 fluid casette lot #[id number] H exp 09/23/2016   COONOSCOPY     AND ENDOSCOPY   DILATION AND  CURETTAGE OF UTERUS     ENDARTERECTOMY Left 11/13/2020   Procedure: Evacuation of left neck hematoma;  Surgeon: Renford Dills, MD;  Location: [facility] ORS;  Service: Vascular;  Laterality: Left;   ENDARTERECTOMY Left 11/13/2020   Procedure: ENDARTERECTOMY CAROTID;  Surgeon: Annice Needy, MD;  Location: [facility] ORS;  Service: Vascular;  Laterality: Left;   TONSILLECTOMY     TUBAL LIGATION      There were no vitals filed for this visit.   Subjective Assessment - 12/25/20 1521     Subjective  "I'm feeling better from the diarrhea but now I have gout in my L foot."    Patient is accompanied by: Family member    Pertinent History R ankle pain, gout, T2DM, HTN, CKDIII; R/L carotid endarectomy    Currently in Pain? No/denies            OT TREATMENT    Pt. performed gross gripping with a gross grip strengthener. Pt. worked on sustaining grip while grasping pegs and reaching at various heights. The gripper was set to 11.2# of grip strength resistance. Pt. was able to clear the pegboard 2 x's.   Neuro muscular re-education:   Pt. worked on grasping, flipping, turning, and stacking [place] style discs with her right hand, reach up and discard them into a container.  Selfcare:   Pt. worked on Camera operator of flowers, and months of the year. Pt. formulated all words in capital letters with 75% legibility using a standard pen.  Pt. presented with no deviation below the line.    Pt. is making progress with RUE functioning. Pt. was able to sustain grip with 11.2# of force clearing the board 2 times. Pt. was able to write multiple lists of words of varying lengths with 75% legibility consistently creating small letter size. Pt. was able to turn the [place] discs a 1/2 rotation. Pt. had more difficulty turning the discs a full rotation. Pt. continues to work on improving her dominant RUE motor control, and [facility] skills in order to work towards improving, and maximizing independence with ADLs, and  IADLs.                            OT Education - 12/25/20 1521     Education Details Right hand strengthening, [facility]    Person(s) Educated Patient    Methods Explanation;Verbal cues;Demonstration    Comprehension Verbalized understanding;Need further instruction              OT Short Term Goals - 12/04/20 1254       OT SHORT TERM GOAL #1   Title Pt will perform HEP for RUE strength/coordination independently.    Baseline Eval: HEP not yet established in outpatient, but pt is working with putty from [name]; 12/04/2020; [facility] HEP initiated this day with mod vc after return demo.    Time 6    Period Weeks    Status On-going    Target Date 12/21/20               OT Long Term Goals - 12/04/20 1255       OT LONG TERM GOAL #1   Title Pt will improve hand writing to 100% legibility with R hand to be able to independently write a check.    Baseline Eval: signature is 75% legible with R hand, requiring extra time; 12/04/2020: no change from eval    Time 12    Period Weeks    Status On-going    Target Date 02/01/21      OT LONG TERM GOAL #2   Title Pt will improve R hand coordination to enable good manipulation of iphone using R dominant hand    Baseline Eval: Pt uses L hand d/t R hand lacking sufficient FMC to press buttons on phone; 12/04/2020: no change from eval    Time 12    Period Weeks    Status On-going    Target Date 02/01/21      OT LONG TERM GOAL #3   Title Pt will improve GMC throughout RUE to enable pt to reach for and pick up ADL supplies with R dominant hand without dropping or knocking over objects.    Baseline Eval: Pt reports RUE is clumsy and easily knocks over objects when trying to reach for ADL supplies.  Pt verbalizes that her "aim is off."; 12/04/2020: pt reports slight improvement since eval and has started using her R hand to eat, but still feels clumsy.    Time 12    Period Weeks    Status On-going    Target Date 02/01/21      OT  LONG TERM GOAL #5   Title Pt will perform dynamic standing tasks with modified indep, AD as needed in order to reduce fall  risk with ADLs    Baseline Eval: CGA-min A for all dynamic standing tasks using RW; 12/04/2020: pt is using RW in home with distant supv, but family still provides close supv on stairs    Time 12    Period Weeks    Status On-going    Target Date 02/01/21                   Plan - 12/25/20 1523     Clinical Impression Statement Pt. is making progress with RUE functioning. Pt. was able to sustain grip with 11.2# of force clearing the board 2 times. Pt. was able to write multiple lists of words of varying lengths with 75% legibility consistently creating small letter size. Pt. was able to turn the [place] discs a 1/2 rotation. Pt. had more difficulty turing the discs a full rotation. Pt. continues to work on improving her dominant RUE motor control, and [facility] skills in order to work towards improving, and maximizing independence with ADLs, and IADLs.   OT Occupational Profile and History Detailed Assessment- Review of Records and additional review of physical, cognitive, psychosocial history related to current functional performance    Occupational performance deficits (Please refer to evaluation for details): ADL's;Leisure;IADL's    Body Structure / Function / Physical Skills ADL;Coordination;Endurance;GMC;UE functional use;Balance;Sensation;Body mechanics;IADL;Pain;Dexterity;FMC;Strength;Gait;Mobility    Rehab Potential Good    Clinical Decision Making Several treatment options, min-mod task modification necessary    Comorbidities Affecting Occupational Performance: May have comorbidities impacting occupational performance    Modification or Assistance to Complete Evaluation  Min-Moderate modification of tasks or assist with assess necessary to complete eval    OT Frequency 2x / week    OT Duration 12 weeks    OT Treatment/Interventions Self-care/ADL  training;Therapeutic exercise;DME and/or AE instruction;Functional Mobility Training;Balance training;Neuromuscular education;Therapeutic activities;Patient/family education    Consulted and Agree with Plan of Care Patient             Patient will benefit from skilled therapeutic intervention in order to improve the following deficits and impairments:   Body Structure / Function / Physical Skills: ADL, Coordination, Endurance, GMC, UE functional use, Balance, Sensation, Body mechanics, IADL, Pain, Dexterity, FMC, Strength, Gait, Mobility       Visit Diagnosis: Muscle weakness (generalized)  Other lack of coordination    Problem List Patient Active Problem List   Diagnosis Date Noted   Carotid stenosis, symptomatic, with infarction (HCC) 11/13/2020   Gout flare 11/10/2020   UTI (urinary tract infection) 11/10/2020   Left carotid artery stenosis 11/10/2020   Chronic kidney disease 11/10/2020   Left basal ganglia embolic stroke (HCC) 10/24/2020   PAC (premature atrial contraction)    Pre-procedural cardiovascular examination    Ischemic stroke (HCC) 10/20/2020   TIA (transient ischemic attack) 10/19/2020   Right hemiparesis (HCC) 10/19/2020   Type 2 diabetes mellitus with stage 3 chronic kidney disease, without long-term current use of insulin (HCC) 08/24/2018   Hyperlipidemia, unspecified 07/19/2017   Hypertension 07/19/2017   PUD (peptic ulcer disease) 07/19/2017   Type 2 diabetes mellitus (HCC) 07/19/2017   Personal history of gout 08/12/2016   Anemia, unspecified 04/09/2016   Hypothyroidism, acquired 12/10/2015    Olegario Messier, MS, OTR/L 12/25/2020, 3:28 PM  [place] [facility] MAIN [facility] SERVICES [address] [place], [place], [address] Phone: [phone]   Fax:  [phone]  Name: Langston Summerfield MRN: [id number] Date of Birth: 1929-01-05

## 2020-12-25 NOTE — Therapy (Signed)
[place] [facility] MAIN [facility] SERVICES [address] [place], [place], [address] Phone: [phone]   Fax:  [phone]  Physical Therapy Treatment  Patient Details  Name: Cassandra Schaefer MRN: [id number] Date of Birth: 1928/12/13 Referring Provider (PT): Dr. Dew/Dr. Marcello Fennel PCP   Encounter Date: 12/25/2020   PT End of Session - 12/25/20 1655     Visit Number 6    Number of Visits 17    Date for PT Re-Evaluation 01/28/21    Authorization Type Medicare    Authorization Time Period 12/02/20 start of care    PT Start Time 1603    PT Stop Time 1645    PT Time Calculation (min) 42 min    Equipment Utilized During Treatment Gait belt    Activity Tolerance Patient tolerated treatment well    Behavior During Therapy WFL for tasks assessed/performed             Past Medical History:  Diagnosis Date   Anemia    Cough    RESOLVING, NO FEVER   Diabetes mellitus without complication (HCC)    Gout    Hypertension    Hypothyroidism    PUD (peptic ulcer disease)    Stroke [facility])    Wears dentures    full upper, partial lower    Past Surgical History:  Procedure Laterality Date   AMPUTATION TOE Left 12/14/2017   Procedure: AMPUTATION TOE-4TH MPJ;  Surgeon: Gwyneth Revels, DPM;  Location: [facility];  Service: Podiatry;  Laterality: Left;  IVA LOCAL Diabetic - oral meds   APPENDECTOMY     CATARACT EXTRACTION W/PHACO Right 06/23/2015   Procedure: CATARACT EXTRACTION PHACO AND INTRAOCULAR LENS PLACEMENT (IOC);  Surgeon: Sallee Lange, MD;  Location: [facility] ORS;  Service: Ophthalmology;  Laterality: Right;  [place] 01:28 AP% 23.4 CDE 36.14 fluid pack lot # [id number]   CATARACT EXTRACTION W/PHACO Left 07/28/2015   Procedure: CATARACT EXTRACTION PHACO AND INTRAOCULAR LENS PLACEMENT (IOC);  Surgeon: Sallee Lange, MD;  Location: [facility] ORS;  Service: Ophthalmology;  Laterality: Left;  [place]    1:29.7 AP%  24.9 CDE   41.32 fluid casette lot #[id number] H exp  09/23/2016   COONOSCOPY     AND ENDOSCOPY   DILATION AND CURETTAGE OF UTERUS     ENDARTERECTOMY Left 11/13/2020   Procedure: Evacuation of left neck hematoma;  Surgeon: Renford Dills, MD;  Location: [facility] ORS;  Service: Vascular;  Laterality: Left;   ENDARTERECTOMY Left 11/13/2020   Procedure: ENDARTERECTOMY CAROTID;  Surgeon: Annice Needy, MD;  Location: [facility] ORS;  Service: Vascular;  Laterality: Left;   TONSILLECTOMY     TUBAL LIGATION      There were no vitals filed for this visit.   Subjective Assessment - 12/25/20 1606     Subjective Pt reports L foot pain is a 5/10 d/t gout. Pt reports she has taken medication for it. She reports her stomach feels better. Pt reports no falls or near falls since last visit.    Patient is accompained by: Family member   Daughter, [name]   Pertinent History 87 y.o. female with medical history significant for type 2 diabetes mellitus, essential hypertension, acquired hypothyroidism, hyperlipidemia, stage III chronic kidney disease reports increased right sided weakness on 10/19/20. She was diagnosed with left basal ganglia ischemic stroke. She was discharged to inpatient rehab for 10 days and then discharged home. Patient was evaluated by outpatient PT on 11/12/20. CVA was due to carotid stenosis. She underwent left carotid  endartectomy on 7/21. Procedure went well however patient suffered from left cervical hematoma and had subsequent surgical procedure to stop the bleed and remove the hematoma on 11/13/20. They were concerned with her breathing and therefore intubated her for 1 day, she was in ICU for 2 days and transferred to med/surge on 11/15/20. Patient was discharged home on 11/17/20 and is now returning to outpatient PT. She has had the stiches removed and is healing well. Denies any neck pain or stiffness. She lives with her daughter and has 24/7 caregiver support. She is still using RW for most ambulation. She is able to transfer to bedside commode well and  is able to stand short time unsupported. She reports feeling very weak and fatigued. She reports she has been dragging her right foot more. She denies any numbness/tingling; She reports feeling like her legs are wanting to do more but is hesitant because of fear of falling. She is mod I for self care morning routine. She does still receive help with showering. She has been working on increasing her activity but denies any formal exercise.    Limitations Standing;Walking    How long can you sit comfortably? NA    How long can you stand comfortably? 30-60 min with support    How long can you walk comfortably? about 100 feet with RW, still limited to short distances;    Diagnostic tests MRI shows left basal ganglia infarct 10/21/20    Patient Stated Goals "Improve balance and walking and not need RW, get back to independent."    Currently in Pain? Yes    Pain Score 5     Pain Location Foot    Pain Orientation Left    Pain Onset In the past 7 days                TREATMENT: CGA-min a throughout session for pt safety  Standing in parallel bars: 3x15 hip abduction, with PT providing hand as target for pt technique as pt compensates with hip flexors   Seated 2# ankle weight: LAQ 2x20 alternating reps each LE with cues to increase terminal knee extension on RLE. Seated DF 2x20  Standing in parallel bars: Forward step up on 6 inch step with 2 rail assist x10-15 reps each LE; Pt continues to be challenged with RLE foot clearance. Does improve with VC. Side step up and over 6 inch step x8 reps each direction with 2 rail assist    NMR: Standing on airex pad: NBOS EO 2x30-45 sec --progressed to dual cog. Task, naming animals, names of friends/family NBOS EC 45 sec, pt reports too fatigued to attempt second set Semi tandem stance - 2x45 sec with RLE as primary stance leg, 30 sec with LLE as stance leg. Discontinued on LLE d/t gout related pain.  Patient required min A with balance exercises,  most challenged with EC NBOS. Pt did have some difficulty with dual cognitive task.     PT Short Term Goals - 12/03/20 0827       PT SHORT TERM GOAL #1   Title Patient will be adherent to HEP at least 3x a week to improve functional strength and balance for better safety at home.    Time 4    Period Weeks    Status New    Target Date 12/31/20      PT SHORT TERM GOAL #2   Title Patient will be independent in transferring sit<>Stand without pushing on arm rests to improve ability to  get up from chair.    Time 4    Period Weeks    Status New    Target Date 12/31/20               PT Long Term Goals - 12/03/20 0828       PT LONG TERM GOAL #1   Title Patient (> 16 years old) will complete five times sit to stand test in < 15 seconds indicating an increased LE strength and improved balance.    Time 8    Period Weeks    Status New    Target Date 01/28/21      PT LONG TERM GOAL #2   Title Patient will increase six minute walk test distance to >400 feet for improve gait ability    Time 8    Period Weeks    Status New    Target Date 01/28/21      PT LONG TERM GOAL #3   Title Patient will increase 10 meter walk test to >1.70m/s as to improve gait speed for better community ambulation and to reduce fall risk.    Time 8    Period Weeks    Status New    Target Date 01/28/21      PT LONG TERM GOAL #4   Title Patient will be independent with ascend/descend 6 steps using single UE in step over step pattern without LOB.    Time 8    Period Weeks    Status New    Target Date 01/28/21      PT LONG TERM GOAL #5   Title Patient will demonstrate an improved Berg Balance Score of >45/56 as to demonstrate improved balance with ADLs such as sitting/standing and transfer balance and reduced fall risk.    Time 8    Period Weeks    Status New    Target Date 01/28/21      PT LONG TERM GOAL #6   Title Patient will improve FOTO score to >60% to indicate improved functional mobility  with ADLs.    Time 8    Period Weeks    Status New    Target Date 01/28/21                   Plan - 12/25/20 1655     Clinical Impression Statement Pt balance training somewhat limited secondary to LLE foot pain d/t Gout flare. While pt was limited with balance training, she exhibited good activity tolerance with therex, and performed all therex within a pain-free range. Pt was able to increase volume of reps performed with several exercises, indicating improved endurance of LEs. The pt will benefit form additional skilled PT to improve strength, balance and mobility.    Personal Factors and Comorbidities Age;Comorbidity 3+;Fitness    Comorbidities HTN, CKD, fall risk, type 2 diabetes,    Examination-Activity Limitations Lift;Locomotion Level;Squat;Stairs;Stand;Toileting;Transfers;Bathing    Examination-Participation Restrictions Cleaning;Community Activity;Laundry;Meal Prep;Shop;Volunteer;Driving    Stability/Clinical Decision Making Stable/Uncomplicated    PT Frequency 2x / week    PT Duration 8 weeks    PT Treatment/Interventions Cryotherapy;Electrical Stimulation;Moist Heat;Gait training;Stair training;Functional mobility training;Therapeutic activities;Therapeutic exercise;Neuromuscular re-education;Balance training;Patient/family education;Orthotic Fit/Training;Energy conservation    PT Next Visit Plan work on LE strengthening and balance;    PT Home Exercise Plan Initiated, see patient instructions    Consulted and Agree with Plan of Care Patient             Patient will benefit from skilled therapeutic intervention in  order to improve the following deficits and impairments:  Abnormal gait, Decreased balance, Decreased endurance, Decreased mobility, Difficulty walking, Cardiopulmonary status limiting activity, Decreased activity tolerance, Decreased coordination, Decreased strength  Visit Diagnosis: Other abnormalities of gait and mobility  Muscle weakness  (generalized)  Unsteadiness on feet  Other lack of coordination     Problem List Patient Active Problem List   Diagnosis Date Noted   Carotid stenosis, symptomatic, with infarction (HCC) 11/13/2020   Gout flare 11/10/2020   UTI (urinary tract infection) 11/10/2020   Left carotid artery stenosis 11/10/2020   Chronic kidney disease 11/10/2020   Left basal ganglia embolic stroke (HCC) 10/24/2020   PAC (premature atrial contraction)    Pre-procedural cardiovascular examination    Ischemic stroke (HCC) 10/20/2020   TIA (transient ischemic attack) 10/19/2020   Right hemiparesis (HCC) 10/19/2020   Type 2 diabetes mellitus with stage 3 chronic kidney disease, without long-term current use of insulin (HCC) 08/24/2018   Hyperlipidemia, unspecified 07/19/2017   Hypertension 07/19/2017   PUD (peptic ulcer disease) 07/19/2017   Type 2 diabetes mellitus (HCC) 07/19/2017   Personal history of gout 08/12/2016   Anemia, unspecified 04/09/2016   Hypothyroidism, acquired 12/10/2015   Temple Pacini PT, DPT 12/25/2020, 4:59 PM  [place] MAIN [facility] SERVICES [address] [place], [place], [address] Phone: [phone]   Fax:  [phone]  Name: Daneka Lantigua MRN: [id number] Date of Birth: 10/21/1928

## 2020-12-26 NOTE — Patient Instructions (Signed)
Read lists of words, phrases and sentences

## 2020-12-26 NOTE — Therapy (Signed)
[place] [facility] MAIN [facility] SERVICES [address] [place], [place], [address] Phone: [phone]   Fax:  [phone]  Speech Language Pathology Treatment  Patient Details  Name: Cassandra Schaefer MRN: [id number] Date of Birth: April 22, 1929 Referring Provider (SLP): Barbette Reichmann   Encounter Date: 12/25/2020   End of Session - 12/26/20 1125     Visit Number 6    Number of Visits 25    Date for SLP Re-Evaluation 02/27/21    Authorization Type [employer]    Authorization Time Period 12/04/2020 thru 02/27/2021    Authorization - Visit Number 6    Progress Note Due on Visit 10    SLP Start Time 1400    SLP Stop Time  1500    SLP Time Calculation (min) 60 min    Activity Tolerance Patient tolerated treatment well             Past Medical History:  Diagnosis Date   Anemia    Cough    RESOLVING, NO FEVER   Diabetes mellitus without complication (HCC)    Gout    Hypertension    Hypothyroidism    PUD (peptic ulcer disease)    Stroke [facility])    Wears dentures    full upper, partial lower    Past Surgical History:  Procedure Laterality Date   AMPUTATION TOE Left 12/14/2017   Procedure: AMPUTATION TOE-4TH MPJ;  Surgeon: Gwyneth Revels, DPM;  Location: [facility];  Service: Podiatry;  Laterality: Left;  IVA LOCAL Diabetic - oral meds   APPENDECTOMY     CATARACT EXTRACTION W/PHACO Right 06/23/2015   Procedure: CATARACT EXTRACTION PHACO AND INTRAOCULAR LENS PLACEMENT (IOC);  Surgeon: Sallee Lange, MD;  Location: [facility] ORS;  Service: Ophthalmology;  Laterality: Right;  [place] 01:28 AP% 23.4 CDE 36.14 fluid pack lot # [id number]   CATARACT EXTRACTION W/PHACO Left 07/28/2015   Procedure: CATARACT EXTRACTION PHACO AND INTRAOCULAR LENS PLACEMENT (IOC);  Surgeon: Sallee Lange, MD;  Location: [facility] ORS;  Service: Ophthalmology;  Laterality: Left;  [place]    1:29.7 AP%  24.9 CDE   41.32 fluid casette lot #[id number] H exp 09/23/2016   COONOSCOPY      AND ENDOSCOPY   DILATION AND CURETTAGE OF UTERUS     ENDARTERECTOMY Left 11/13/2020   Procedure: Evacuation of left neck hematoma;  Surgeon: Renford Dills, MD;  Location: [facility] ORS;  Service: Vascular;  Laterality: Left;   ENDARTERECTOMY Left 11/13/2020   Procedure: ENDARTERECTOMY CAROTID;  Surgeon: Annice Needy, MD;  Location: [facility] ORS;  Service: Vascular;  Laterality: Left;   TONSILLECTOMY     TUBAL LIGATION      There were no vitals filed for this visit.   Subjective Assessment - 12/26/20 1122     Subjective "I am feeling good"    Currently in Pain? No/denies                   ADULT SLP TREATMENT - 12/26/20 0001       Treatment Provided   Treatment provided Cognitive-Linquistic      Cognitive-Linquistic Treatment   Treatment focused on Aphasia;Dysarthria;Patient/family/caregiver education    Skilled Treatment DYSARHTRIA: 95% intelligibility reading /spr/ words (p 84); 95%  /spr/ phrases & sentences (p85),  95% /shr/ words (p80); 95% /shr/ phrases and sentences  (p81); 95% /str/ words (p86), 95% /str/ phrases and sentences; carryover intelligibility ~90%, independent rate control              SLP  Education - 12/26/20 1125     Education Details provided    [employer]) Educated Patient    Methods Explanation;Demonstration;Verbal cues    Comprehension Verbalized understanding;Returned demonstration              SLP Short Term Goals - 12/05/20 1542       SLP SHORT TERM GOAL #1   Title Pt will list 10 items within semi-complex category over 3 consecutive sessions.    Baseline new goal    Time 10   sessions   Status New      SLP SHORT TERM GOAL #2   Title With supervision cues, pt will utilize speech intelligibility strategies at the connected sentence level to achieve > 95% speech intelligibility.    Baseline new goal    Time 10    Period --   sessions   Status New      SLP SHORT TERM GOAL #3   Title Pt will read at the paragraph level with >  75% speech intelligibility.    Baseline new goal    Time 10    Period --   session   Status New              SLP Long Term Goals - 12/05/20 1544       SLP LONG TERM GOAL #1   Title Pt will utilize word finding strategies to effectively communicate abstract/coomplex ideas/information.    Baseline new goal    Time 12    Period Weeks    Status New    Target Date 02/27/21      SLP LONG TERM GOAL #2   Title Pt will speech intelligibility strategies to achieve > 95% speech intelligibility during complex conversation.    Baseline new goal    Period Weeks    Status New    Target Date 02/27/21              Plan - 12/26/20 1125     Clinical Impression Statement Pt presents with improving dysarthria and increased rate control of speech. As a result, pt's speech intelligibility is 80% intelligible at the conversation level within a mildly noisy environment. Skilled ST is required to improve pt's functional communication, reduce caregiver burden and return pt to church and community activities.    Speech Therapy Frequency 2x / week    Duration 12 weeks    Treatment/Interventions Language facilitation;Functional tasks;SLP instruction and feedback;Patient/family education    Potential to Achieve Goals Good    SLP Home Exercise Plan provided, see pt instruction section    Consulted and Agree with Plan of Care Patient             Patient will benefit from skilled therapeutic intervention in order to improve the following deficits and impairments:   Aphasia  Dysarthria and anarthria  Left basal ganglia embolic stroke [facility])    Problem List Patient Active Problem List   Diagnosis Date Noted   Carotid stenosis, symptomatic, with infarction (HCC) 11/13/2020   Gout flare 11/10/2020   UTI (urinary tract infection) 11/10/2020   Left carotid artery stenosis 11/10/2020   Chronic kidney disease 11/10/2020   Left basal ganglia embolic stroke (HCC) 10/24/2020   PAC (premature  atrial contraction)    Pre-procedural cardiovascular examination    Ischemic stroke (HCC) 10/20/2020   TIA (transient ischemic attack) 10/19/2020   Right hemiparesis (HCC) 10/19/2020   Type 2 diabetes mellitus with stage 3 chronic kidney disease, without long-term current use of insulin (  HCC) 08/24/2018   Hyperlipidemia, unspecified 07/19/2017   Hypertension 07/19/2017   PUD (peptic ulcer disease) 07/19/2017   Type 2 diabetes mellitus (HCC) 07/19/2017   Personal history of gout 08/12/2016   Anemia, unspecified 04/09/2016   Hypothyroidism, acquired 12/10/2015   Mani Celestin B. Dreama Saa M.S., CCC-SLP, [facility] Speech-Language Pathologist Rehabilitation Services Office [phone]  [name] 12/26/2020, 11:26 AM  [place] [facility] MAIN [facility] SERVICES [address] [place], [place], [address] Phone: [phone]   Fax:  [phone]   Name: Cassandra Schaefer MRN: [id number] Date of Birth: 12/07/1928

## 2020-12-30 ENCOUNTER — Ambulatory Visit: Payer: Medicare HMO | Admitting: Physical Therapy

## 2020-12-30 ENCOUNTER — Encounter (INDEPENDENT_AMBULATORY_CARE_PROVIDER_SITE_OTHER): Payer: Self-pay | Admitting: Nurse Practitioner

## 2020-12-30 ENCOUNTER — Other Ambulatory Visit: Payer: Self-pay

## 2020-12-30 ENCOUNTER — Ambulatory Visit (INDEPENDENT_AMBULATORY_CARE_PROVIDER_SITE_OTHER): Payer: Medicare HMO

## 2020-12-30 ENCOUNTER — Encounter: Payer: Medicare HMO | Admitting: Occupational Therapy

## 2020-12-30 ENCOUNTER — Ambulatory Visit (INDEPENDENT_AMBULATORY_CARE_PROVIDER_SITE_OTHER): Payer: Medicare HMO | Admitting: Nurse Practitioner

## 2020-12-30 VITALS — BP 170/57 | HR 71 | Resp 16 | Wt 113.6 lb

## 2020-12-30 DIAGNOSIS — I63239 Cerebral infarction due to unspecified occlusion or stenosis of unspecified carotid arteries: Secondary | ICD-10-CM | POA: Diagnosis not present

## 2020-12-30 DIAGNOSIS — E119 Type 2 diabetes mellitus without complications: Secondary | ICD-10-CM

## 2020-12-30 DIAGNOSIS — E785 Hyperlipidemia, unspecified: Secondary | ICD-10-CM

## 2020-12-31 ENCOUNTER — Ambulatory Visit: Payer: Medicare HMO | Admitting: Physical Therapy

## 2020-12-31 ENCOUNTER — Ambulatory Visit: Payer: Medicare HMO | Admitting: Speech Pathology

## 2020-12-31 ENCOUNTER — Encounter: Payer: Self-pay | Admitting: Occupational Therapy

## 2020-12-31 ENCOUNTER — Encounter: Payer: Self-pay | Admitting: Physical Therapy

## 2020-12-31 ENCOUNTER — Ambulatory Visit: Payer: Medicare HMO | Admitting: Occupational Therapy

## 2020-12-31 DIAGNOSIS — R471 Dysarthria and anarthria: Secondary | ICD-10-CM

## 2020-12-31 DIAGNOSIS — R2689 Other abnormalities of gait and mobility: Secondary | ICD-10-CM

## 2020-12-31 DIAGNOSIS — R4701 Aphasia: Secondary | ICD-10-CM | POA: Diagnosis not present

## 2020-12-31 DIAGNOSIS — M6281 Muscle weakness (generalized): Secondary | ICD-10-CM

## 2020-12-31 DIAGNOSIS — R278 Other lack of coordination: Secondary | ICD-10-CM

## 2020-12-31 DIAGNOSIS — R2681 Unsteadiness on feet: Secondary | ICD-10-CM

## 2020-12-31 DIAGNOSIS — I639 Cerebral infarction, unspecified: Secondary | ICD-10-CM

## 2020-12-31 NOTE — Therapy (Signed)
[place] [facility] MAIN [facility] SERVICES [address] [place], [place], [address] Phone: [phone]   Fax:  [phone]  Occupational Therapy Treatment  Patient Details  Name: Cassandra Schaefer MRN: [id number] Date of Birth: [birth year] Referring Provider (OT): Dr. Barbette Reichmann   Encounter Date: 12/31/2020   OT End of Session - 12/31/20 1209     Visit Number 8    Number of Visits 24    Date for OT Re-Evaluation 02/01/21    OT Start Time 1152    OT Stop Time 1230    OT Time Calculation (min) 38 min    Activity Tolerance Patient tolerated treatment well    Behavior During Therapy WFL for tasks assessed/performed             Past Medical History:  Diagnosis Date   Anemia    Cough    RESOLVING, NO FEVER   Diabetes mellitus without complication (HCC)    Gout    Hypertension    Hypothyroidism    PUD (peptic ulcer disease)    Stroke [facility])    Wears dentures    full upper, partial lower    Past Surgical History:  Procedure Laterality Date   AMPUTATION TOE Left 12/14/2017   Procedure: AMPUTATION TOE-4TH MPJ;  Surgeon: Gwyneth Revels, DPM;  Location: [facility];  Service: Podiatry;  Laterality: Left;  IVA LOCAL Diabetic - oral meds   APPENDECTOMY     CATARACT EXTRACTION W/PHACO Right 06/23/2015   Procedure: CATARACT EXTRACTION PHACO AND INTRAOCULAR LENS PLACEMENT (IOC);  Surgeon: Sallee Lange, MD;  Location: [facility] ORS;  Service: Ophthalmology;  Laterality: Right;  [place] 01:28 AP% 23.4 CDE 36.14 fluid pack lot # [id number]   CATARACT EXTRACTION W/PHACO Left 07/28/2015   Procedure: CATARACT EXTRACTION PHACO AND INTRAOCULAR LENS PLACEMENT (IOC);  Surgeon: Sallee Lange, MD;  Location: [facility] ORS;  Service: Ophthalmology;  Laterality: Left;  [place]    1:29.7 AP%  24.9 CDE   41.32 fluid casette lot #[id number] H exp 09/23/2016   COONOSCOPY     AND ENDOSCOPY   DILATION AND CURETTAGE OF UTERUS     ENDARTERECTOMY Left 11/13/2020   Procedure:  Evacuation of left neck hematoma;  Surgeon: Renford Dills, MD;  Location: [facility] ORS;  Service: Vascular;  Laterality: Left;   ENDARTERECTOMY Left 11/13/2020   Procedure: ENDARTERECTOMY CAROTID;  Surgeon: Annice Needy, MD;  Location: [facility] ORS;  Service: Vascular;  Laterality: Left;   TONSILLECTOMY     TUBAL LIGATION      There were no vitals filed for this visit.   Subjective Assessment - 12/31/20 1206     Subjective  Pt. reports that she is feeling good.    Patient is accompanied by:    Patient Stated Goals "I want to improve my aim when I'm using my R arm."    Currently in Pain? No/denies            OT TREATMENT    Neuro muscular re-education:  Pt. performed [facility] tasks using the Grooved pegboard. Pt. worked on grasping the grooved pegs from a horizontal position, and moving the pegs to a vertical position in the hand to prepare for placing them in the grooved slot.  Pt. worked on using her right hand to grasp the pegs, turn them in her hand, and place them onto the grooved pegboard. Pt. Worked on removing the pegs while alternating thumb opposition to the tip of the 2nd through 5th digits.  Therapeutic Exercise:  Pt. performed gross gripping with a gross grip strengthener. Pt. worked on sustaining grip while grasping pegs and reaching at various heights. The Gripper was set to 11.2 # of grip strength resistance.   Pt. is making progress with RUE functioning. Pt. reports that she is now engaging her right hand during more daily tasks at home. Pt. continues to improve with hand strength, and [facility] skills. Pt. was able to grasp the grooved pegs, however had difficulty turning them in her hand in the proper direction. Pt. was able to turn each peg when working on each step of the task separately. Pt. continues to work on improving [facility] skills in order to work towards improving, and maximizing independence with ADLs, and IADL tasks.                         OT  Education - 12/31/20 1209     Education Details Right hand strengthening, [facility]    Person(s) Educated Patient    Methods Explanation;Verbal cues;Demonstration    Comprehension Verbalized understanding;Need further instruction              OT Short Term Goals - 12/04/20 1254       OT SHORT TERM GOAL #1   Title Pt will perform HEP for RUE strength/coordination independently.    Baseline Eval: HEP not yet established in outpatient, but pt is working with putty from [name]; 12/04/2020; [employer] HEP initiated this day with mod vc after return demo.    Time 6    Period Weeks    Status On-going    Target Date 12/21/20               OT Long Term Goals - 12/04/20 1255       OT LONG TERM GOAL #1   Title Pt will improve hand writing to 100% legibility with R hand to be able to independently write a check.    Baseline Eval: signature is 75% legible with R hand, requiring extra time; 12/04/2020: no change from eval    Time 12    Period Weeks    Status On-going    Target Date 02/01/21      OT LONG TERM GOAL #2   Title Pt will improve R hand coordination to enable good manipulation of iphone using R dominant hand    Baseline Eval: Pt uses L hand d/t R hand lacking sufficient FMC to press buttons on phone; 12/04/2020: no change from eval    Time 12    Period Weeks    Status On-going    Target Date 02/01/21      OT LONG TERM GOAL #3   Title Pt will improve GMC throughout RUE to enable pt to reach for and pick up ADL supplies with R dominant hand without dropping or knocking over objects.    Baseline Eval: Pt reports RUE is clumsy and easily knocks over objects when trying to reach for ADL supplies.  Pt verbalizes that her "aim is off."; 12/04/2020: pt reports slight improvement since eval and has started using her R hand to eat, but still feels clumsy.    Time 12    Period Weeks    Status On-going    Target Date 02/01/21      OT LONG TERM GOAL #5   Title Pt will perform dynamic standing  tasks with modified indep, AD as needed in order to reduce fall risk with ADLs    Baseline Eval:  CGA-min A for all dynamic standing tasks using RW; 12/04/2020: pt is using RW in home with distant supv, but family still provides close supv on stairs    Time 12    Period Weeks    Status On-going    Target Date 02/01/21                   Plan - 12/31/20 1210     Clinical Impression Statement Pt. is making progress with RUE functioning. Pt. reports that she is now engaging her right hand during more daily tasks at home. Pt. continues to improve with hand strength, and [facility] skills. Pt. was able to grasp the grooved pegs, however had difficulty turning them in her hand in the proper direction. Pt. was able to turn each peg when working on each step of the task separately. Pt. continues to work on improving [facility] skills in order to work towards improving, and maximizing independence with ADLs, and IADL tasks   OT Occupational Profile and History Detailed Assessment- Review of Records and additional review of physical, cognitive, psychosocial history related to current functional performance    Occupational performance deficits (Please refer to evaluation for details): ADL's;Leisure;IADL's    Body Structure / Function / Physical Skills ADL;Coordination;Endurance;GMC;UE functional use;Balance;Sensation;Body mechanics;IADL;Pain;Dexterity;FMC;Strength;Gait;Mobility    Rehab Potential Good    Clinical Decision Making Several treatment options, min-mod task modification necessary    Comorbidities Affecting Occupational Performance: May have comorbidities impacting occupational performance    Modification or Assistance to Complete Evaluation  Min-Moderate modification of tasks or assist with assess necessary to complete eval    OT Frequency 2x / week    OT Duration 12 weeks    OT Treatment/Interventions Self-care/ADL training;Therapeutic exercise;DME and/or AE instruction;Functional Mobility  Training;Balance training;Neuromuscular education;Therapeutic activities;Patient/family education    Consulted and Agree with Plan of Care Patient             Patient will benefit from skilled therapeutic intervention in order to improve the following deficits and impairments:   Body Structure / Function / Physical Skills: ADL, Coordination, Endurance, GMC, UE functional use, Balance, Sensation, Body mechanics, IADL, Pain, Dexterity, FMC, Strength, Gait, Mobility       Visit Diagnosis: Muscle weakness (generalized)  Other lack of coordination    Problem List Patient Active Problem List   Diagnosis Date Noted   Carotid stenosis, symptomatic, with infarction (HCC) 11/13/2020   Gout flare 11/10/2020   UTI (urinary tract infection) 11/10/2020   Left carotid artery stenosis 11/10/2020   Chronic kidney disease 11/10/2020   Left basal ganglia embolic stroke (HCC) 10/24/2020   PAC (premature atrial contraction)    Pre-procedural cardiovascular examination    Ischemic stroke (HCC) 10/20/2020   TIA (transient ischemic attack) 10/19/2020   Right hemiparesis (HCC) 10/19/2020   Type 2 diabetes mellitus with stage 3 chronic kidney disease, without long-term current use of insulin (HCC) 08/24/2018   Hyperlipidemia, unspecified 07/19/2017   Hypertension 07/19/2017   PUD (peptic ulcer disease) 07/19/2017   Type 2 diabetes mellitus (HCC) 07/19/2017   Personal history of gout 08/12/2016   Anemia, unspecified 04/09/2016   Hypothyroidism, acquired 12/10/2015    Olegario Messier, MS, OTR/L 12/31/2020, 5:30 PM   [facility] MAIN [facility] SERVICES [address] [place], [place], [address] Phone: [phone]   Fax:  [phone]  Name: Aylana Hirschfeld MRN: [id number] Date of Birth: 1929/02/05

## 2020-12-31 NOTE — Therapy (Signed)
[place] [facility] MAIN [facility] SERVICES [address] [place], [place], [address] Phone: [phone]   Fax:  [phone]  Physical Therapy Treatment  Patient Details  Name: Cassandra Schaefer MRN: [id number] Date of Birth: 02-Feb-1929 Referring Provider (PT): Dr. Dew/Dr. Marcello Fennel PCP   Encounter Date: 12/31/2020   PT End of Session - 12/31/20 1308     Visit Number 7    Number of Visits 17    Date for PT Re-Evaluation 01/28/21    Authorization Type Medicare    Authorization Time Period 12/02/20 start of care    PT Start Time 1304    PT Stop Time 1345    PT Time Calculation (min) 41 min    Equipment Utilized During Treatment Gait belt    Activity Tolerance Patient tolerated treatment well    Behavior During Therapy WFL for tasks assessed/performed             Past Medical History:  Diagnosis Date   Anemia    Cough    RESOLVING, NO FEVER   Diabetes mellitus without complication (HCC)    Gout    Hypertension    Hypothyroidism    PUD (peptic ulcer disease)    Stroke [facility])    Wears dentures    full upper, partial lower    Past Surgical History:  Procedure Laterality Date   AMPUTATION TOE Left 12/14/2017   Procedure: AMPUTATION TOE-4TH MPJ;  Surgeon: Gwyneth Revels, DPM;  Location: [facility];  Service: Podiatry;  Laterality: Left;  IVA LOCAL Diabetic - oral meds   APPENDECTOMY     CATARACT EXTRACTION W/PHACO Right 06/23/2015   Procedure: CATARACT EXTRACTION PHACO AND INTRAOCULAR LENS PLACEMENT (IOC);  Surgeon: Sallee Lange, MD;  Location: [facility] ORS;  Service: Ophthalmology;  Laterality: Right;  [place] 01:28 AP% 23.4 CDE 36.14 fluid pack lot # [id number]   CATARACT EXTRACTION W/PHACO Left 07/28/2015   Procedure: CATARACT EXTRACTION PHACO AND INTRAOCULAR LENS PLACEMENT (IOC);  Surgeon: Sallee Lange, MD;  Location: [facility] ORS;  Service: Ophthalmology;  Laterality: Left;  [place]    1:29.7 AP%  24.9 CDE   41.32 fluid casette lot #[id number] H exp  09/23/2016   COONOSCOPY     AND ENDOSCOPY   DILATION AND CURETTAGE OF UTERUS     ENDARTERECTOMY Left 11/13/2020   Procedure: Evacuation of left neck hematoma;  Surgeon: Renford Dills, MD;  Location: [facility] ORS;  Service: Vascular;  Laterality: Left;   ENDARTERECTOMY Left 11/13/2020   Procedure: ENDARTERECTOMY CAROTID;  Surgeon: Annice Needy, MD;  Location: [facility] ORS;  Service: Vascular;  Laterality: Left;   TONSILLECTOMY     TUBAL LIGATION      There were no vitals filed for this visit.   Subjective Assessment - 12/31/20 1307     Subjective Pt reports feeling a lot better. She denies any pain. reports feeling more like herself. She was excited to report that she was able to recover her balance one day over the weekend and avoided a fall.    Patient is accompained by: Family member   Daughter, [name]   Pertinent History 87 y.o. female with medical history significant for type 2 diabetes mellitus, essential hypertension, acquired hypothyroidism, hyperlipidemia, stage III chronic kidney disease reports increased right sided weakness on 10/19/20. She was diagnosed with left basal ganglia ischemic stroke. She was discharged to inpatient rehab for 10 days and then discharged home. Patient was evaluated by outpatient PT on 11/12/20. CVA was due to carotid stenosis. She  underwent left carotid endartectomy on 7/21. Procedure went well however patient suffered from left cervical hematoma and had subsequent surgical procedure to stop the bleed and remove the hematoma on 11/13/20. They were concerned with her breathing and therefore intubated her for 1 day, she was in ICU for 2 days and transferred to med/surge on 11/15/20. Patient was discharged home on 11/17/20 and is now returning to outpatient PT. She has had the stiches removed and is healing well. Denies any neck pain or stiffness. She lives with her daughter and has 24/7 caregiver support. She is still using RW for most ambulation. She is able to transfer to  bedside commode well and is able to stand short time unsupported. She reports feeling very weak and fatigued. She reports she has been dragging her right foot more. She denies any numbness/tingling; She reports feeling like her legs are wanting to do more but is hesitant because of fear of falling. She is mod I for self care morning routine. She does still receive help with showering. She has been working on increasing her activity but denies any formal exercise.    Limitations Standing;Walking    How long can you sit comfortably? NA    How long can you stand comfortably? 30-60 min with support    How long can you walk comfortably? about 100 feet with RW, still limited to short distances;    Diagnostic tests MRI shows left basal ganglia infarct 10/21/20    Patient Stated Goals "Improve balance and walking and not need RW, get back to independent."    Currently in Pain? No/denies    Pain Onset In the past 7 days                  TREATMENT: Warm up on Nustep BUE/BLE level 3 x4 min (Unbilled);  Standing in parallel bars: 2# ankle weight: -alternate march x12 reps each LE -BLE heel raises x12 reps -hip extension SLR x12 reps with mod VCs to keep knee extended for better hip strengthening -hip abduction SLR x12 reps each with BUE rail assist with cues to avoid hip ER for better LE strengthening;  -Side stepping x15 feet x1 laps each direction, no rail assist with CGA for safety; Patient able to take better step with verbal cues;  Patient required min-moderate verbal/tactile cues for correct exercise technique.   Seated 2# ankle weight: LAQ with ankle DF x12 reps each LE with cues to increase terminal knee extension;    Ascend/descend 4 steps with B rail assist x3 sets Initially patient negotiated one step at a time and then 2 subsequent steps ascend reciprocally, descend reciprocal and non-reciprocal; Patient exhibits increased difficulty descending;  She does require close supervision  to [facility] for safety;    Gait around gym x50 feet x2 with RW; initially patient able to exhibit good heel strike on RLE with better reciprocal gait pattern.   NMR: Standing on airex pad:             Feet apart: picking up cones on 6 inch step with 1 rail assist x3 x2 sets  Progressed to feet apart, tapping cones with hand unsupported x3 reps x2 sets with min A for safety  Alternate toe taps airex to 6 inch step unsupported x10 reps each  Standing one foot on airex, one foot on 6 inch step:   Unsupported standing 15 sec hold   Passing cone side/side x5 reps each foot on step;  Forward/backward walking using 1 rail  assist x2 reps with cues for increased base of support, increase step length;   Patient required min A with balance exercise with increased instability noted with narrow base of support. She had a harder time standing with right foot on airex and left foot on step due to weakness in RLE;   Patient tolerated session well. She does report mild fatigue at end of session. She required CGA  throughout session for safety;  Patient expressed difficulty with picking up objects. She required min A and/or rail assist for safety when bending over. Initially patient reports dizziness with picking up objects but with further assessment, reports unsteadiness;   She was able to tolerate increased repetition with exercise reporting less fatigue;                            PT Education - 12/31/20 1308     Education Details exercise technique, balance/strengthening;    Person(s) Educated Patient    Methods Explanation;Verbal cues    Comprehension Verbalized understanding;Returned demonstration;Verbal cues required;Need further instruction              PT Short Term Goals - 12/03/20 0827       PT SHORT TERM GOAL #1   Title Patient will be adherent to HEP at least 3x a week to improve functional strength and balance for better safety at home.    Time 4    Period Weeks     Status New    Target Date 12/31/20      PT SHORT TERM GOAL #2   Title Patient will be independent in transferring sit<>Stand without pushing on arm rests to improve ability to get up from chair.    Time 4    Period Weeks    Status New    Target Date 12/31/20               PT Long Term Goals - 12/03/20 0828       PT LONG TERM GOAL #1   Title Patient (> 24 years old) will complete five times sit to stand test in < 15 seconds indicating an increased LE strength and improved balance.    Time 8    Period Weeks    Status New    Target Date 01/28/21      PT LONG TERM GOAL #2   Title Patient will increase six minute walk test distance to >400 feet for improve gait ability    Time 8    Period Weeks    Status New    Target Date 01/28/21      PT LONG TERM GOAL #3   Title Patient will increase 10 meter walk test to >1.39m/s as to improve gait speed for better community ambulation and to reduce fall risk.    Time 8    Period Weeks    Status New    Target Date 01/28/21      PT LONG TERM GOAL #4   Title Patient will be independent with ascend/descend 6 steps using single UE in step over step pattern without LOB.    Time 8    Period Weeks    Status New    Target Date 01/28/21      PT LONG TERM GOAL #5   Title Patient will demonstrate an improved Berg Balance Score of >45/56 as to demonstrate improved balance with ADLs such as sitting/standing and transfer balance and reduced fall risk.  Time 8    Period Weeks    Status New    Target Date 01/28/21      PT LONG TERM GOAL #6   Title Patient will improve FOTO score to >60% to indicate improved functional mobility with ADLs.    Time 8    Period Weeks    Status New    Target Date 01/28/21                   Plan - 12/31/20 1459     Clinical Impression Statement Patient motivatd and participated well within session. She reports less pain and less fatigue today. Progressed strengthening with increased repetition.  Patient does require min VCS for proper exercise technique. She requires intermittent rest breaks due to fatigue. Patient progressed step negotiation able to complete reciprocally. She does require CGA to close supervision and BUE rail assist. Recommend patient continue negotiating steps non-reciprocal until she is a little stronger. Instructed patient in advanced balance exercise. She does have difficulty with bending over and picking up objects. Progressed balance exercise, reducing rail assist to challenge stance control. She does exhibit decreased ankle strategies which makes her feel more unsteady. Patient would benefit from additional skilled PT Intervention to improve strength, balance and mobility;    Personal Factors and Comorbidities Age;Comorbidity 3+;Fitness    Comorbidities HTN, CKD, fall risk, type 2 diabetes,    Examination-Activity Limitations Lift;Locomotion Level;Squat;Stairs;Stand;Toileting;Transfers;Bathing    Examination-Participation Restrictions Cleaning;Community Activity;Laundry;Meal Prep;Shop;Volunteer;Driving    Stability/Clinical Decision Making Stable/Uncomplicated    PT Frequency 2x / week    PT Duration 8 weeks    PT Treatment/Interventions Cryotherapy;Electrical Stimulation;Moist Heat;Gait training;Stair training;Functional mobility training;Therapeutic activities;Therapeutic exercise;Neuromuscular re-education;Balance training;Patient/family education;Orthotic Fit/Training;Energy conservation    PT Next Visit Plan work on LE strengthening and balance;    PT Home Exercise Plan Initiated, see patient instructions    Consulted and Agree with Plan of Care Patient             Patient will benefit from skilled therapeutic intervention in order to improve the following deficits and impairments:  Abnormal gait, Decreased balance, Decreased endurance, Decreased mobility, Difficulty walking, Cardiopulmonary status limiting activity, Decreased activity tolerance, Decreased  coordination, Decreased strength  Visit Diagnosis: Muscle weakness (generalized)  Other lack of coordination  Other abnormalities of gait and mobility  Unsteadiness on feet     Problem List Patient Active Problem List   Diagnosis Date Noted   Carotid stenosis, symptomatic, with infarction (HCC) 11/13/2020   Gout flare 11/10/2020   UTI (urinary tract infection) 11/10/2020   Left carotid artery stenosis 11/10/2020   Chronic kidney disease 11/10/2020   Left basal ganglia embolic stroke (HCC) 10/24/2020   PAC (premature atrial contraction)    Pre-procedural cardiovascular examination    Ischemic stroke (HCC) 10/20/2020   TIA (transient ischemic attack) 10/19/2020   Right hemiparesis (HCC) 10/19/2020   Type 2 diabetes mellitus with stage 3 chronic kidney disease, without long-term current use of insulin (HCC) 08/24/2018   Hyperlipidemia, unspecified 07/19/2017   Hypertension 07/19/2017   PUD (peptic ulcer disease) 07/19/2017   Type 2 diabetes mellitus (HCC) 07/19/2017   Personal history of gout 08/12/2016   Anemia, unspecified 04/09/2016   Hypothyroidism, acquired 12/10/2015    Shanee Batch, PT, DPT 12/31/2020, 3:02 PM  [place] [facility] [address] [place], [place], [address] Phone: [phone]   Fax:  [phone]  Name: Neysa BonitoJanet Ann Schaefer MRN: [id number] Date of Birth: 1928/08/06

## 2021-01-01 ENCOUNTER — Encounter: Payer: Medicare HMO | Admitting: Occupational Therapy

## 2021-01-01 ENCOUNTER — Ambulatory Visit: Payer: Medicare HMO | Admitting: Physical Therapy

## 2021-01-01 NOTE — Therapy (Signed)
[place] [facility] MAIN [facility] SERVICES [address] [place], [place], [address] Phone: [phone]   Fax:  [phone]  Speech Language Pathology Treatment  Patient Details  Name: Cassandra Schaefer MRN: [id number] Date of Birth: 1929/03/13 Referring Provider (SLP): Barbette Reichmann   Encounter Date: 12/31/2020   End of Session - 01/01/21 1613     Visit Number 7    Number of Visits 25    Date for SLP Re-Evaluation 02/27/21    Authorization Type [employer] Medicare    Authorization Time Period 12/04/2020 thru 02/27/2021    Authorization - Visit Number 7    Progress Note Due on Visit 10    SLP Start Time 1400    SLP Stop Time  1500    SLP Time Calculation (min) 60 min    Activity Tolerance Patient tolerated treatment well             Past Medical History:  Diagnosis Date   Anemia    Cough    RESOLVING, NO FEVER   Diabetes mellitus without complication (HCC)    Gout    Hypertension    Hypothyroidism    PUD (peptic ulcer disease)    Stroke [facility])    Wears dentures    full upper, partial lower    Past Surgical History:  Procedure Laterality Date   AMPUTATION TOE Left 12/14/2017   Procedure: AMPUTATION TOE-4TH MPJ;  Surgeon: Gwyneth Revels, DPM;  Location: [facility];  Service: Podiatry;  Laterality: Left;  IVA LOCAL Diabetic - oral meds   APPENDECTOMY     CATARACT EXTRACTION W/PHACO Right 06/23/2015   Procedure: CATARACT EXTRACTION PHACO AND INTRAOCULAR LENS PLACEMENT (IOC);  Surgeon: Sallee Lange, MD;  Location: [facility] ORS;  Service: Ophthalmology;  Laterality: Right;  [place] 01:28 AP% 23.4 CDE 36.14 fluid pack lot # [id number]   CATARACT EXTRACTION W/PHACO Left 07/28/2015   Procedure: CATARACT EXTRACTION PHACO AND INTRAOCULAR LENS PLACEMENT (IOC);  Surgeon: Sallee Lange, MD;  Location: [facility] ORS;  Service: Ophthalmology;  Laterality: Left;  [place]    1:29.7 AP%  24.9 CDE   41.32 fluid casette lot #[id number] H exp 09/23/2016   COONOSCOPY      AND ENDOSCOPY   DILATION AND CURETTAGE OF UTERUS     ENDARTERECTOMY Left 11/13/2020   Procedure: Evacuation of left neck hematoma;  Surgeon: Renford Dills, MD;  Location: [facility] ORS;  Service: Vascular;  Laterality: Left;   ENDARTERECTOMY Left 11/13/2020   Procedure: ENDARTERECTOMY CAROTID;  Surgeon: Annice Needy, MD;  Location: [facility] ORS;  Service: Vascular;  Laterality: Left;   TONSILLECTOMY     TUBAL LIGATION      There were no vitals filed for this visit.   Subjective Assessment - 01/01/21 1612     Subjective "I am feeling so much better"    Currently in Pain? No/denies                   ADULT SLP TREATMENT - 01/01/21 0001       Treatment Provided   Treatment provided Cognitive-Linquistic      Cognitive-Linquistic Treatment   Treatment focused on Aphasia;Dysarthria;Patient/family/caregiver education    Skilled Treatment DYSARTHRIA: supervision to minimal cues to use slow rate and over-articulation at the unknown conversational level when teaching SLP unknown card game              SLP Education - 01/01/21 1613     Education Details progress towards goals  SLP Short Term Goals - 12/05/20 1542       SLP SHORT TERM GOAL #1   Title Pt will list 10 items within semi-complex category over 3 consecutive sessions.    Baseline new goal    Time 10   sessions   Status New      SLP SHORT TERM GOAL #2   Title With supervision cues, pt will utilize speech intelligibility strategies at the connected sentence level to achieve > 95% speech intelligibility.    Baseline new goal    Time 10    Period --   sessions   Status New      SLP SHORT TERM GOAL #3   Title Pt will read at the paragraph level with > 75% speech intelligibility.    Baseline new goal    Time 10    Period --   session   Status New              SLP Long Term Goals - 12/05/20 1544       SLP LONG TERM GOAL #1   Title Pt will utilize word finding strategies to  effectively communicate abstract/coomplex ideas/information.    Baseline new goal    Time 12    Period Weeks    Status New    Target Date 02/27/21      SLP LONG TERM GOAL #2   Title Pt will speech intelligibility strategies to achieve > 95% speech intelligibility during complex conversation.    Baseline new goal    Period Weeks    Status New    Target Date 02/27/21              Plan - 01/01/21 1613     Clinical Impression Statement Pt presents with improving dysarthria and increased rate control of speech. As a result, pt's speech intelligibility is 80% intelligible at the conversation level within a mildly noisy environment. Skilled ST is required to improve pt's functional communication, reduce caregiver burden and return pt to church and community activities.    Speech Therapy Frequency 2x / week    Duration 12 weeks    Treatment/Interventions Language facilitation;Functional tasks;SLP instruction and feedback;Patient/family education    Potential to Achieve Goals Good    Consulted and Agree with Plan of Care Patient             Patient will benefit from skilled therapeutic intervention in order to improve the following deficits and impairments:   Aphasia  Dysarthria and anarthria  Left basal ganglia embolic stroke [facility])    Problem List Patient Active Problem List   Diagnosis Date Noted   Carotid stenosis, symptomatic, with infarction (HCC) 11/13/2020   Gout flare 11/10/2020   UTI (urinary tract infection) 11/10/2020   Left carotid artery stenosis 11/10/2020   Chronic kidney disease 11/10/2020   Left basal ganglia embolic stroke (HCC) 10/24/2020   PAC (premature atrial contraction)    Pre-procedural cardiovascular examination    Ischemic stroke (HCC) 10/20/2020   TIA (transient ischemic attack) 10/19/2020   Right hemiparesis (HCC) 10/19/2020   Type 2 diabetes mellitus with stage 3 chronic kidney disease, without long-term current use of insulin (HCC)  08/24/2018   Hyperlipidemia, unspecified 07/19/2017   Hypertension 07/19/2017   PUD (peptic ulcer disease) 07/19/2017   Type 2 diabetes mellitus (HCC) 07/19/2017   Personal history of gout 08/12/2016   Anemia, unspecified 04/09/2016   Hypothyroidism, acquired 12/10/2015   Merion Caton B. Dreama Saa, M.S., CCC-SLP, CBIS Speech-Language Pathologist Rehabilitation Services  Office [phone]   [name] 01/01/2021, 4:14 PM  [place] [facility] MAIN [facility] SERVICES [address] [place], [place], [address] Phone: [phone]   Fax:  [phone]   Name: Cassandra Schaefer MRN: [id number] Date of Birth: June 21, 1928

## 2021-01-02 ENCOUNTER — Other Ambulatory Visit: Payer: Self-pay

## 2021-01-02 ENCOUNTER — Ambulatory Visit: Payer: Medicare HMO | Admitting: Speech Pathology

## 2021-01-02 ENCOUNTER — Ambulatory Visit: Payer: Medicare HMO

## 2021-01-02 DIAGNOSIS — M6281 Muscle weakness (generalized): Secondary | ICD-10-CM

## 2021-01-02 DIAGNOSIS — R278 Other lack of coordination: Secondary | ICD-10-CM

## 2021-01-02 DIAGNOSIS — R4701 Aphasia: Secondary | ICD-10-CM | POA: Diagnosis not present

## 2021-01-02 DIAGNOSIS — I639 Cerebral infarction, unspecified: Secondary | ICD-10-CM

## 2021-01-02 DIAGNOSIS — R471 Dysarthria and anarthria: Secondary | ICD-10-CM

## 2021-01-02 NOTE — Therapy (Signed)
[facility] MAIN [facility] SERVICES [address] [place], [place], [address] Phone: [phone]   Fax:  [phone]  Occupational Therapy Treatment  Patient Details  Name: Cassandra Schaefer MRN: [id number] Date of Birth: [birth year] Referring Provider (OT): Dr. Barbette Reichmann   Encounter Date: 01/02/2021   OT End of Session - 01/02/21 1102     Visit Number 9    Number of Visits 24    Date for OT Re-Evaluation 02/01/21    Authorization Time Period Reporting period starting 11/10/2020    OT Start Time 1005    OT Stop Time 1048    OT Time Calculation (min) 43 min    Equipment Utilized During Treatment wc    Activity Tolerance Patient tolerated treatment well    Behavior During Therapy WFL for tasks assessed/performed             Past Medical History:  Diagnosis Date   Anemia    Cough    RESOLVING, NO FEVER   Diabetes mellitus without complication (HCC)    Gout    Hypertension    Hypothyroidism    PUD (peptic ulcer disease)    Stroke [facility])    Wears dentures    full upper, partial lower    Past Surgical History:  Procedure Laterality Date   AMPUTATION TOE Left 12/14/2017   Procedure: AMPUTATION TOE-4TH MPJ;  Surgeon: Gwyneth Revels, DPM;  Location: [facility];  Service: Podiatry;  Laterality: Left;  IVA LOCAL Diabetic - oral meds   APPENDECTOMY     CATARACT EXTRACTION W/PHACO Right 06/23/2015   Procedure: CATARACT EXTRACTION PHACO AND INTRAOCULAR LENS PLACEMENT (IOC);  Surgeon: Sallee Lange, MD;  Location: [facility] ORS;  Service: Ophthalmology;  Laterality: Right;  [place] 01:28 AP% 23.4 CDE 36.14 fluid pack lot # [id number]   CATARACT EXTRACTION W/PHACO Left 07/28/2015   Procedure: CATARACT EXTRACTION PHACO AND INTRAOCULAR LENS PLACEMENT (IOC);  Surgeon: Sallee Lange, MD;  Location: [facility] ORS;  Service: Ophthalmology;  Laterality: Left;  [place]    1:29.7 AP%  24.9 CDE   41.32 fluid casette lot #[id number] H exp 09/23/2016    COONOSCOPY     AND ENDOSCOPY   DILATION AND CURETTAGE OF UTERUS     ENDARTERECTOMY Left 11/13/2020   Procedure: Evacuation of left neck hematoma;  Surgeon: Renford Dills, MD;  Location: [facility] ORS;  Service: Vascular;  Laterality: Left;   ENDARTERECTOMY Left 11/13/2020   Procedure: ENDARTERECTOMY CAROTID;  Surgeon: Annice Needy, MD;  Location: [facility] ORS;  Service: Vascular;  Laterality: Left;   TONSILLECTOMY     TUBAL LIGATION      There were no vitals filed for this visit.   Subjective Assessment - 01/02/21 1101     Subjective  Pt looking forward to a lunch outing with a friend today that she hasn't seen since having her stroke.    Pertinent History R ankle pain, gout, T2DM, HTN, CKDIII; R/L carotid endarectomy    Patient Stated Goals "I want to improve my aim when I'm using my R arm."    Currently in Pain? No/denies    Pain Score 0-No pain    Pain Onset In the past 7 days            Occupational Therapy Treatment: Neuro re-ed: Pt. performed R FMC tasks using the Grooved pegboard. Pt. worked on grasping the grooved pegs from a horizontal position, and moving the pegs to a vertical position in the hand to enable placing them  in the grooved slot.  Used tweezers in R hand to remove pegs from grooved pegboard.  Used hand gripper set at 11.2 lbs to place and remove jumbo pegs from pegboard.  Pegboard positioned on elevated and inclined surface to further challenge forward reach.  Performed 3 trials, rest breaks between each set, and intermittently within the 3rd set.  Practiced coin manipulation activities to grasping, storing, transferring coins from palm of hand to slotted bank.  Good ability to pick up coins from non-skid surface, extra time to move coins from tips of fingers to store in palm of hand, extra time to pick up from smooth table top surface.    Response to Treatment: See Plan/clinical impression below.       OT Education - 01/02/21 1101     Education Details Right  hand strengthening, [facility]    Person(s) Educated Patient    Methods Explanation;Verbal cues;Demonstration    Comprehension Verbalized understanding;Need further instruction              OT Short Term Goals - 12/04/20 1254       OT SHORT TERM GOAL #1   Title Pt will perform HEP for RUE strength/coordination independently.    Baseline Eval: HEP not yet established in outpatient, but pt is working with putty from [name]; 12/04/2020; [facility] HEP initiated this day with mod vc after return demo.    Time 6    Period Weeks    Status On-going    Target Date 12/21/20               OT Long Term Goals - 12/04/20 1255       OT LONG TERM GOAL #1   Title Pt will improve hand writing to 100% legibility with R hand to be able to independently write a check.    Baseline Eval: signature is 75% legible with R hand, requiring extra time; 12/04/2020: no change from eval    Time 12    Period Weeks    Status On-going    Target Date 02/01/21      OT LONG TERM GOAL #2   Title Pt will improve R hand coordination to enable good manipulation of iphone using R dominant hand    Baseline Eval: Pt uses L hand d/t R hand lacking sufficient FMC to press buttons on phone; 12/04/2020: no change from eval    Time 12    Period Weeks    Status On-going    Target Date 02/01/21      OT LONG TERM GOAL #3   Title Pt will improve GMC throughout RUE to enable pt to reach for and pick up ADL supplies with R dominant hand without dropping or knocking over objects.    Baseline Eval: Pt reports RUE is clumsy and easily knocks over objects when trying to reach for ADL supplies.  Pt verbalizes that her "aim is off."; 12/04/2020: pt reports slight improvement since eval and has started using her R hand to eat, but still feels clumsy.    Time 12    Period Weeks    Status On-going    Target Date 02/01/21      OT LONG TERM GOAL #5   Title Pt will perform dynamic standing tasks with modified indep, AD as needed in order to  reduce fall risk with ADLs    Baseline Eval: CGA-min A for all dynamic standing tasks using RW; 12/04/2020: pt is using RW in home with distant supv, but family still  provides close supv on stairs    Time 12    Period Weeks    Status On-going    Target Date 02/01/21               Plan - 01/02/21 1113     Clinical Impression Statement Pt continues to progress use of R hand with self care tasks, reporting improved accuracy, but fatigue with use.  Pt reports tingling throughout R hand this day, which further challenges ability to pick up small objects.  Pt will continue to benefit from strengthening and coordination training to improve accuracy and efficiency when using R hand for self care tasks.    OT Occupational Profile and History Detailed Assessment- Review of Records and additional review of physical, cognitive, psychosocial history related to current functional performance    Occupational performance deficits (Please refer to evaluation for details): ADL's;Leisure;IADL's    Body Structure / Function / Physical Skills ADL;Coordination;Endurance;GMC;UE functional use;Balance;Sensation;Body mechanics;IADL;Pain;Dexterity;FMC;Strength;Gait;Mobility    Rehab Potential Good    Clinical Decision Making Several treatment options, min-mod task modification necessary    Comorbidities Affecting Occupational Performance: May have comorbidities impacting occupational performance    Modification or Assistance to Complete Evaluation  Min-Moderate modification of tasks or assist with assess necessary to complete eval    OT Frequency 2x / week    OT Duration 12 weeks    OT Treatment/Interventions Self-care/ADL training;Therapeutic exercise;DME and/or AE instruction;Functional Mobility Training;Balance training;Neuromuscular education;Therapeutic activities;Patient/family education    Consulted and Agree with Plan of Care Patient             Patient will benefit from skilled therapeutic  intervention in order to improve the following deficits and impairments:   Body Structure / Function / Physical Skills: ADL, Coordination, Endurance, GMC, UE functional use, Balance, Sensation, Body mechanics, IADL, Pain, Dexterity, FMC, Strength, Gait, Mobility       Visit Diagnosis: Left basal ganglia embolic stroke (HCC)  Muscle weakness (generalized)  Other lack of coordination    Problem List Patient Active Problem List   Diagnosis Date Noted   Carotid stenosis, symptomatic, with infarction (HCC) 11/13/2020   Gout flare 11/10/2020   UTI (urinary tract infection) 11/10/2020   Left carotid artery stenosis 11/10/2020   Chronic kidney disease 11/10/2020   Left basal ganglia embolic stroke (HCC) 10/24/2020   PAC (premature atrial contraction)    Pre-procedural cardiovascular examination    Ischemic stroke (HCC) 10/20/2020   TIA (transient ischemic attack) 10/19/2020   Right hemiparesis (HCC) 10/19/2020   Type 2 diabetes mellitus with stage 3 chronic kidney disease, without long-term current use of insulin (HCC) 08/24/2018   Hyperlipidemia, unspecified 07/19/2017   Hypertension 07/19/2017   PUD (peptic ulcer disease) 07/19/2017   Type 2 diabetes mellitus (HCC) 07/19/2017   Personal history of gout 08/12/2016   Anemia, unspecified 04/09/2016   Hypothyroidism, acquired 12/10/2015   Danelle Earthly, MS, OTR/L  Otis Dials, OT/L 01/02/2021, 11:13 AM  [facility] MAIN [facility] SERVICES [address] [place], [place], [address] Phone: [phone]   Fax:  [phone]  Name: Cassandra Schaefer MRN: [id number] Date of Birth: May 25, 1928

## 2021-01-03 NOTE — Therapy (Signed)
[facility] MAIN [facility] SERVICES [address] [place], [place], [address] Phone: [phone]   Fax:  [phone]  Speech Language Pathology Treatment  Patient Details  Name: Cassandra Schaefer MRN: [id number] Date of Birth: 12-17-1928 Referring Provider (SLP): Barbette Reichmann   Encounter Date: 01/02/2021   End of Session - 01/03/21 1020     Visit Number 8    Number of Visits 25    Date for SLP Re-Evaluation 02/27/21    Authorization Type [employer]    Authorization Time Period 12/04/2020 thru 02/27/2021    Authorization - Visit Number 8    Progress Note Due on Visit 10    SLP Start Time 0920    SLP Stop Time  1000    SLP Time Calculation (min) 40 min    Activity Tolerance Patient tolerated treatment well             Past Medical History:  Diagnosis Date   Anemia    Cough    RESOLVING, NO FEVER   Diabetes mellitus without complication (HCC)    Gout    Hypertension    Hypothyroidism    PUD (peptic ulcer disease)    Stroke [facility])    Wears dentures    full upper, partial lower    Past Surgical History:  Procedure Laterality Date   AMPUTATION TOE Left 12/14/2017   Procedure: AMPUTATION TOE-4TH MPJ;  Surgeon: Gwyneth Revels, DPM;  Location: [facility];  Service: Podiatry;  Laterality: Left;  IVA LOCAL Diabetic - oral meds   APPENDECTOMY     CATARACT EXTRACTION W/PHACO Right 06/23/2015   Procedure: CATARACT EXTRACTION PHACO AND INTRAOCULAR LENS PLACEMENT (IOC);  Surgeon: Sallee Lange, MD;  Location: [facility] ORS;  Service: Ophthalmology;  Laterality: Right;  [place] 01:28 AP% 23.4 CDE 36.14 fluid pack lot # [id number]   CATARACT EXTRACTION W/PHACO Left 07/28/2015   Procedure: CATARACT EXTRACTION PHACO AND INTRAOCULAR LENS PLACEMENT (IOC);  Surgeon: Sallee Lange, MD;  Location: [facility] ORS;  Service: Ophthalmology;  Laterality: Left;  [place]    1:29.7 AP%  24.9 CDE   41.32 fluid casette lot #[id number] H exp 09/23/2016   COONOSCOPY      AND ENDOSCOPY   DILATION AND CURETTAGE OF UTERUS     ENDARTERECTOMY Left 11/13/2020   Procedure: Evacuation of left neck hematoma;  Surgeon: Renford Dills, MD;  Location: [facility] ORS;  Service: Vascular;  Laterality: Left;   ENDARTERECTOMY Left 11/13/2020   Procedure: ENDARTERECTOMY CAROTID;  Surgeon: Annice Needy, MD;  Location: [facility] ORS;  Service: Vascular;  Laterality: Left;   TONSILLECTOMY     TUBAL LIGATION      There were no vitals filed for this visit.   Subjective Assessment - 01/03/21 1017     Subjective "this is early for me"    Currently in Pain? No/denies                   ADULT SLP TREATMENT - 01/03/21 0001       Treatment Provided   Treatment provided Cognitive-Linquistic      Cognitive-Linquistic Treatment   Treatment focused on Aphasia;Dysarthria;Patient/family/caregiver education    Skilled Treatment DYSARTHRIA: MOD I to supervision cues to use slow rate and over-articulation at the unknown conversational level when teaching SLP unknown card game and providing unknown information regarding TV coverage of [place] death              SLP Education - 01/03/21 1020  Education Details progress towards goals with potential d/c at end of next week    Person(s) Educated Patient    Methods Explanation;Demonstration;Verbal cues    Comprehension Verbalized understanding;Returned demonstration              SLP Short Term Goals - 12/05/20 1542       SLP SHORT TERM GOAL #1   Title Pt will list 10 items within semi-complex category over 3 consecutive sessions.    Baseline new goal    Time 10   sessions   Status New      SLP SHORT TERM GOAL #2   Title With supervision cues, pt will utilize speech intelligibility strategies at the connected sentence level to achieve > 95% speech intelligibility.    Baseline new goal    Time 10    Period --   sessions   Status New      SLP SHORT TERM GOAL #3   Title Pt will read at the paragraph  level with > 75% speech intelligibility.    Baseline new goal    Time 10    Period --   session   Status New              SLP Long Term Goals - 12/05/20 1544       SLP LONG TERM GOAL #1   Title Pt will utilize word finding strategies to effectively communicate abstract/coomplex ideas/information.    Baseline new goal    Time 12    Period Weeks    Status New    Target Date 02/27/21      SLP LONG TERM GOAL #2   Title Pt will speech intelligibility strategies to achieve > 95% speech intelligibility during complex conversation.    Baseline new goal    Period Weeks    Status New    Target Date 02/27/21              Plan - 01/03/21 1021     Clinical Impression Statement Pt presents with improving dysarthria and increased rate control of speech. As a result, pt's speech intelligibility is 95% intelligible at the conversation level within a mildly noisy environment. Skilled ST is required to improve pt's functional communication, reduce caregiver burden and return pt to church and community activities.    Speech Therapy Frequency 2x / week    Duration 12 weeks    Treatment/Interventions Language facilitation;Functional tasks;SLP instruction and feedback;Patient/family education    Potential to Achieve Goals Good    SLP Home Exercise Plan provided, see pt instruction section    Consulted and Agree with Plan of Care Patient             Patient will benefit from skilled therapeutic intervention in order to improve the following deficits and impairments:   Dysarthria and anarthria  Aphasia  Left basal ganglia embolic stroke [facility])    Problem List Patient Active Problem List   Diagnosis Date Noted   Carotid stenosis, symptomatic, with infarction (HCC) 11/13/2020   Gout flare 11/10/2020   UTI (urinary tract infection) 11/10/2020   Left carotid artery stenosis 11/10/2020   Chronic kidney disease 11/10/2020   Left basal ganglia embolic stroke (HCC) 10/24/2020    PAC (premature atrial contraction)    Pre-procedural cardiovascular examination    Ischemic stroke (HCC) 10/20/2020   TIA (transient ischemic attack) 10/19/2020   Right hemiparesis (HCC) 10/19/2020   Type 2 diabetes mellitus with stage 3 chronic kidney disease, without long-term current use  of insulin (HCC) 08/24/2018   Hyperlipidemia, unspecified 07/19/2017   Hypertension 07/19/2017   PUD (peptic ulcer disease) 07/19/2017   Type 2 diabetes mellitus (HCC) 07/19/2017   Personal history of gout 08/12/2016   Anemia, unspecified 04/09/2016   Hypothyroidism, acquired 12/10/2015   Jefferie Holston B. Dreama Saa M.S., CCC-SLP, [facility] Speech-Language Pathologist Rehabilitation Services Office [phone]  [name] 01/03/2021, 10:22 AM  [facility] MAIN [facility] SERVICES [address] [place], [place], [address] Phone: [phone]   Fax:  [phone]   Name: Cassandra Schaefer MRN: [id number] Date of Birth: 11-18-1928

## 2021-01-03 NOTE — Patient Instructions (Signed)
Utilize slow rate and over articulation during conversation with friends and family

## 2021-01-04 ENCOUNTER — Encounter (INDEPENDENT_AMBULATORY_CARE_PROVIDER_SITE_OTHER): Payer: Self-pay | Admitting: Nurse Practitioner

## 2021-01-04 NOTE — Progress Notes (Signed)
Subjective:    Patient ID: Cassandra Schaefer, female    DOB: 1928/06/02, 87 y.o.   MRN: [id number] Chief Complaint  Patient presents with   Follow-up    Ultrasound follow up    Cassandra Schaefer is a 87 year old female that  is seen for follow up evaluation of carotid stenosis status post left carotid endarterectomy on 11/13/2020.  The patient had a complication of a hematoma formation the evening of her surgery.  This was evacuated and there have been no issues since.  The patient denies neck or incisional pain.  The incision is well-healed.  The patient denies headache.  The patient is taking enteric-coated aspirin 81 mg daily.  The patient has a history of coronary artery disease, no recent episodes of angina or shortness of breath. The patient denies PAD or claudication symptoms. There is a history of hyperlipidemia which is being treated with a statin.    Today noninvasive study shows 1 to 39% stenosis bilaterally.   Review of Systems  Skin:  Positive for wound.  All other systems reviewed and are negative.     Objective:   Physical Exam Vitals reviewed.  HENT:     Head: Normocephalic.  Cardiovascular:     Rate and Rhythm: Normal rate.     Pulses: Normal pulses.  Pulmonary:     Effort: Pulmonary effort is normal.  Skin:    General: Skin is warm and dry.  Neurological:     Mental Status: She is alert and oriented to person, place, and time.  Psychiatric:        Mood and Affect: Mood normal.        Behavior: Behavior normal.        Thought Content: Thought content normal.        Judgment: Judgment normal.    BP (!) 170/57 (BP Location: Left Arm)   Pulse 71   Resp 16   Wt 113 lb 9.6 oz (51.5 kg)   BMI 20.12 kg/m   Past Medical History:  Diagnosis Date   Anemia    Cough    RESOLVING, NO FEVER   Diabetes mellitus without complication (HCC)    Gout    Hypertension    Hypothyroidism    PUD (peptic ulcer disease)    Stroke ([place])    Wears dentures    full  upper, partial lower    Social History   Socioeconomic History   Marital status: Widowed    Spouse name: Not on file   Number of children: Not on file   Years of education: Not on file   Highest education level: Not on file  Occupational History   Not on file  Tobacco Use   Smoking status: Former    Types: Cigarettes    Quit date: 12/26/1983    Years since quitting: 37.0   Smokeless tobacco: Never  Vaping Use   Vaping Use: Never used  Substance and Sexual Activity   Alcohol use: No   Drug use: Not on file   Sexual activity: Not on file  Other Topics Concern   Not on file  Social History Narrative   Live with Genelle Bal one of the daughters.   Social Determinants of Health   Financial Resource Strain: Not on file  Food Insecurity: Not on file  Transportation Needs: Not on file  Physical Activity: Not on file  Stress: Not on file  Social Connections: Not on file  Intimate Partner Violence: Not on file  Past Surgical History:  Procedure Laterality Date   AMPUTATION TOE Left 12/14/2017   Procedure: AMPUTATION TOE-4TH MPJ;  Surgeon: Samara Deist, DPM;  Location: [facility];  Service: Podiatry;  Laterality: Left;  IVA LOCAL Diabetic - oral meds   APPENDECTOMY     CATARACT EXTRACTION W/PHACO Right 06/23/2015   Procedure: CATARACT EXTRACTION PHACO AND INTRAOCULAR LENS PLACEMENT (IOC);  Surgeon: Estill Cotta, MD;  Location: [facility];  Service: Ophthalmology;  Laterality: Right;  [place] 01:28 AP% 23.4 CDE 36.14 fluid pack lot # [id number]   CATARACT EXTRACTION W/PHACO Left 07/28/2015   Procedure: CATARACT EXTRACTION PHACO AND INTRAOCULAR LENS PLACEMENT (IOC);  Surgeon: Estill Cotta, MD;  Location: [facility];  Service: Ophthalmology;  Laterality: Left;  [place]    1:29.7 AP%  24.9 CDE   41.32 fluid casette lot #[id number] H exp 09/23/2016   COONOSCOPY     AND ENDOSCOPY   DILATION AND CURETTAGE OF UTERUS     ENDARTERECTOMY Left 11/13/2020   Procedure: Evacuation of  left neck hematoma;  Surgeon: Katha Cabal, MD;  Location: [facility];  Service: Vascular;  Laterality: Left;   ENDARTERECTOMY Left 11/13/2020   Procedure: ENDARTERECTOMY CAROTID;  Surgeon: Algernon Huxley, MD;  Location: [facility];  Service: Vascular;  Laterality: Left;   TONSILLECTOMY     TUBAL LIGATION      Family History  Problem Relation Age of Onset   Breast cancer Neg Hx     Allergies  Allergen Reactions   Penicillins Hives   Sulfa Antibiotics Hives   Tetanus Toxoids Swelling   Macrobid [Nitrofurantoin] Nausea And Vomiting and Other (See Comments)    HYPOTENSION   Procaine Other (See Comments)    "went into shock"   Tuberculin Tests Swelling and Rash    CBC Latest Ref Rng & Units 11/17/2020 11/15/2020 11/14/2020  WBC 4.0 - 10.5 K/uL 13.5(H) 19.4(H) 22.8(H)  Hemoglobin 12.0 - 15.0 g/dL 9.4(L) 9.8(L) 11.2(L)  Hematocrit 36.0 - 46.0 % 29.0(L) 30.1(L) 35.3(L)  Platelets 150 - 400 K/uL 204 241 249      CMP     Component Value Date/Time   NA 139 11/15/2020 0726   K 4.2 11/15/2020 0726   CL 110 11/15/2020 0726   CO2 21 (L) 11/15/2020 0726   GLUCOSE 136 (H) 11/15/2020 0726   BUN 29 (H) 11/15/2020 0726   CREATININE 1.47 (H) 11/15/2020 0726   CALCIUM 8.6 (L) 11/15/2020 0726   PROT 6.3 (L) 10/28/2020 0517   ALBUMIN 3.2 (L) 10/28/2020 0517   AST 14 (L) 10/28/2020 0517   ALT 10 10/28/2020 0517   ALKPHOS 77 10/28/2020 0517   BILITOT 0.9 10/28/2020 0517   GFRNONAA 34 (L) 11/15/2020 0726     No results found.     Assessment & Plan:   1. Carotid stenosis, symptomatic, with infarction [facility]) Recommend:  The patient is s/p successful left CEA  Duplex ultrasound preoperatively shows 1-39% contralateral stenosis.  Continue antiplatelet therapy as prescribed Continue management of CAD, HTN and Hyperlipidemia Healthy heart diet,  encouraged exercise at least 4 times per week  Follow up in 3 months with duplex ultrasound and physical exam based on the patient's carotid  surgery   2. Hyperlipidemia, unspecified hyperlipidemia type Continue statin as ordered and reviewed, no changes at this time   3. Type 2 diabetes mellitus without complication, without long-term current use of insulin (HCC) Continue hypoglycemic medications as already ordered, these medications have been reviewed and there are no changes at this time.  Hgb A1C to be monitored as already arranged by primary service    Current Outpatient Medications on File Prior to Visit  Medication Sig Dispense Refill   allopurinol (ZYLOPRIM) 100 MG tablet Take 100 mg by mouth daily.     aspirin 81 MG chewable tablet Chew 1 tablet (81 mg total) by mouth daily.     atorvastatin (LIPITOR) 80 MG tablet Take 1 tablet (80 mg total) by mouth daily after supper. 30 tablet 0   Blood Glucose Monitoring Suppl (FIFTY50 GLUCOSE METER 2.0) w/Device KIT Use as directed Dx code: 250.00     clopidogrel (PLAVIX) 75 MG tablet Take 1 tablet (75 mg total) by mouth daily. 30 tablet 0   colchicine 0.6 MG tablet Take 0.5 tablets (0.3 mg total) by mouth daily. 30 tablet 0   fosinopril (MONOPRIL) 10 MG tablet Take 1 tablet by mouth daily.     glucose blood test strip USE TO TEST BLOOD SUGAR DAILY. DX CODE: E11.9     iron polysaccharides (NIFEREX) 150 MG capsule Take by mouth.     JANUVIA 50 MG tablet Take 50 mg by mouth daily.     levothyroxine (SYNTHROID) 75 MCG tablet Take 75 mcg by mouth daily.     Multiple Vitamins-Minerals (ICAPS AREDS 2 PO) Take by mouth 2 (two) times daily. Reported on 06/23/2015     vitamin B-12 (CYANOCOBALAMIN) 1000 MCG tablet Take 2,000 mcg by mouth daily.      levothyroxine (SYNTHROID, LEVOTHROID) 88 MCG tablet Take 88 mcg by mouth daily before breakfast. (Patient not taking: No sig reported)     metFORMIN (GLUCOPHAGE-XR) 500 MG 24 hr tablet Take 1 tablet (500 mg total) by mouth at bedtime. (Patient not taking: Reported on 12/30/2020) 30 tablet 0   oxyCODONE-acetaminophen (PERCOCET/ROXICET) 5-325 MG  tablet Take 1 tablet by mouth every 8 (eight) hours as needed for moderate pain or severe pain. (Patient not taking: No sig reported) 20 tablet 0   predniSONE (STERAPRED UNI-PAK 21 TAB) 5 MG (21) TBPK tablet Use as directed (Patient not taking: No sig reported)     No current facility-administered medications on file prior to visit.    There are no Patient Instructions on file for this visit. No follow-ups on file.   Kris Hartmann, NP

## 2021-01-05 ENCOUNTER — Ambulatory Visit: Payer: Medicare HMO

## 2021-01-05 ENCOUNTER — Ambulatory Visit: Payer: Medicare HMO | Admitting: Speech Pathology

## 2021-01-05 ENCOUNTER — Other Ambulatory Visit: Payer: Self-pay

## 2021-01-05 DIAGNOSIS — R4701 Aphasia: Secondary | ICD-10-CM | POA: Diagnosis not present

## 2021-01-05 DIAGNOSIS — I639 Cerebral infarction, unspecified: Secondary | ICD-10-CM

## 2021-01-05 DIAGNOSIS — R278 Other lack of coordination: Secondary | ICD-10-CM

## 2021-01-05 DIAGNOSIS — R471 Dysarthria and anarthria: Secondary | ICD-10-CM

## 2021-01-05 DIAGNOSIS — M6281 Muscle weakness (generalized): Secondary | ICD-10-CM

## 2021-01-05 NOTE — Therapy (Signed)
[place] [facility] MAIN [facility] SERVICES [address] [place], [place], [address] Phone: [phone]   Fax:  [phone]  Occupational Therapy Treatment/Progress Note: Reporting period starting 11/10/2020-01/05/2021  Patient Details  Name: Cassandra Schaefer MRN: [id number] Date of Birth: 1928/07/09 Referring Provider (OT): Dr. Barbette Reichmann   Encounter Date: 01/05/2021   OT End of Session - 01/05/21 1351     Visit Number 10    Number of Visits 24    Date for OT Re-Evaluation 02/01/21    Authorization Time Period Reporting period starting 11/10/2020    OT Start Time 1300    OT Stop Time 1345    OT Time Calculation (min) 45 min    Equipment Utilized During Treatment wc    Activity Tolerance Patient tolerated treatment well    Behavior During Therapy WFL for tasks assessed/performed             Past Medical History:  Diagnosis Date   Anemia    Cough    RESOLVING, NO FEVER   Diabetes mellitus without complication (HCC)    Gout    Hypertension    Hypothyroidism    PUD (peptic ulcer disease)    Stroke [facility])    Wears dentures    full upper, partial lower    Past Surgical History:  Procedure Laterality Date   AMPUTATION TOE Left 12/14/2017   Procedure: AMPUTATION TOE-4TH MPJ;  Surgeon: Gwyneth Revels, DPM;  Location: [facility];  Service: Podiatry;  Laterality: Left;  IVA LOCAL Diabetic - oral meds   APPENDECTOMY     CATARACT EXTRACTION W/PHACO Right 06/23/2015   Procedure: CATARACT EXTRACTION PHACO AND INTRAOCULAR LENS PLACEMENT (IOC);  Surgeon: Sallee Lange, MD;  Location: [facility];  Service: Ophthalmology;  Laterality: Right;  [place] 01:28 AP% 23.4 CDE 36.14 fluid pack lot # [id number]   CATARACT EXTRACTION W/PHACO Left 07/28/2015   Procedure: CATARACT EXTRACTION PHACO AND INTRAOCULAR LENS PLACEMENT (IOC);  Surgeon: Sallee Lange, MD;  Location: [facility];  Service: Ophthalmology;  Laterality: Left;  [place]    1:29.7 AP%  24.9 CDE    41.32 fluid casette lot #[id number] H exp 09/23/2016   COONOSCOPY     AND ENDOSCOPY   DILATION AND CURETTAGE OF UTERUS     ENDARTERECTOMY Left 11/13/2020   Procedure: Evacuation of left neck hematoma;  Surgeon: Renford Dills, MD;  Location: [facility];  Service: Vascular;  Laterality: Left;   ENDARTERECTOMY Left 11/13/2020   Procedure: ENDARTERECTOMY CAROTID;  Surgeon: Annice Needy, MD;  Location: [facility];  Service: Vascular;  Laterality: Left;   TONSILLECTOMY     TUBAL LIGATION      There were no vitals filed for this visit.   Subjective Assessment - 01/05/21 1349     Subjective  "I had a nice lunch with my friend.  It's nice to get out."    Patient is accompanied by: Family member    Pertinent History R ankle pain, gout, T2DM, HTN, CKDIII; R/L carotid endarectomy    Patient Stated Goals "I want to improve my aim when I'm using my R arm."    Currently in Pain? No/denies    Pain Score 0-No pain    Pain Onset In the past 7 days                [facility] OT Assessment - 01/05/21 0001       Assessment   Medical Diagnosis CVA with R sided hemiparesis    Hand Dominance  Right      Observation/Other Assessments   Focus on Therapeutic Outcomes (FOTO)  61 (improved from 55)      Coordination   Right 9 Hole Peg Test 1 min 19 seconds    Left 9 Hole Peg Test 1 min, 15 secs      Hand Function   Right Hand Grip (lbs) 24    Right Hand Lateral Pinch 12 lbs    Right Hand 3 Point Pinch 9 lbs    Left Hand Grip (lbs) 25    Left Hand Lateral Pinch 15 lbs    Left 3 point pinch 8 lbs            Occupational Therapy Treatment: Neuro Re-ed: Objective measures taken and goals reassessed, note.  Participation in dexterity task using R hand to pick up grooved pegs and place in grooved peg board.  Used tweezers to remove pegs using tweezers horizontally, 2 pegs dropped from board.  Pt was able to place all pegs in board today with improved speed and accuracy as compared to last session.  Pt  was able to turn pegs today within thumb and index finger to match the grooves, and demonstrated improved digit isolation with ability to reposition pegs in hand as needed to enable pinching peg from the top.    Response to Treatment: Pt is making steady gains with all OT goals.  Pt is now using R hand for self feeding and drinking with fair accuracy/extra time.  Pt is performing all basic self care with modified indep, no longer requires supv from family to manage the steps to reach her bedroom upstairs, and uses her RW in the home with modified indep.  RUE ataxia is improving.  Pt acknowledges that her "aim" is improving when she reaches for ADL supplies.  Pt is now able to pick up small objects from the table or a dish with R hand, but often drops objects, and extra time is needed to manipulate small objects within her hand.  Pt will benefit from continued Occupational Therapy to address RUE weakness and coordination deficits in order to maximize independence and efficiency with ADLs/IADLs/leisure activities.     OT Education - 01/05/21 1350     Education Details HEP review    Person(s) Educated Patient    Methods Explanation;Verbal cues;Demonstration    Comprehension Verbalized understanding;Need further instruction              OT Short Term Goals - 01/05/21 1351       OT SHORT TERM GOAL #1   Title Pt will perform HEP for RUE strength/coordination independently.    Baseline Eval: HEP not yet established in outpatient, but pt is working with putty from [name]; 12/04/2020; [facility] HEP initiated this day with mod vc after return demo; 01/05/2021: indep with currently program, but ongoing as pt progresses    Time 6    Period Weeks    Status On-going    Target Date 01/22/21               OT Long Term Goals - 01/05/21 1352       OT LONG TERM GOAL #1   Title Pt will improve hand writing to 100% legibility with R hand to be able to independently write a check.    Baseline Eval:  signature is 75% legible with R hand, requiring extra time; 12/04/2020: no change from eval; 01/05/2021: Printing is 100% legible, signature is 90% legible, but still requires extra time  and effort.    Time 12    Period Weeks    Status On-going    Target Date 02/01/21      OT LONG TERM GOAL #2   Title Pt will improve R hand coordination to enable good manipulation of iphone using R dominant hand    Baseline Eval: Pt uses L hand d/t R hand lacking sufficient FMC to press buttons on phone; 12/04/2020: no change from eval; 01/05/2021: R hand coordination is improving but pt still uses L hand to dial phone.    Time 12    Period Weeks    Status On-going    Target Date 02/01/21      OT LONG TERM GOAL #3   Title Pt will improve GMC throughout RUE to enable pt to reach for and pick up ADL supplies with R dominant hand without dropping or knocking over objects.    Baseline Eval: Pt reports RUE is clumsy and easily knocks over objects when trying to reach for ADL supplies.  Pt verbalizes that her "aim is off."; 12/04/2020: pt reports slight improvement since eval and has started using her R hand to eat, but still feels clumsy; 01/05/2021: Greatly improved; pt is using silverware to eat with R hand, can comb hair with RUE, still mildly ataxic    Time 12    Period Weeks    Status On-going    Target Date 02/01/21      OT LONG TERM GOAL #4   Title Pt will improve FOTO score to 68 or better to indicate measurable functional improvement.    Baseline Eval: FOTO 55; 01/05/2021: FOTO 61    Time 12    Period Weeks    Status On-going    Target Date 02/01/21      OT LONG TERM GOAL #5   Title Pt will perform dynamic standing tasks with modified indep, AD as needed in order to reduce fall risk with ADLs    Baseline Eval: CGA-min A for all dynamic standing tasks using RW; 12/04/2020: pt is using RW in home with distant supv, but family still provides close supv on stairs; 01/05/2021: Pt now using RW to amb in and  around the house, can perform ADLs with RW and modified indep, and can manage stairs at home with modified indep.  No falls reported.    Time 12    Period Weeks    Status Achieved    Target Date 02/01/21                   Plan - 01/05/21 1415     Clinical Impression Statement Pt is making steady gains with all OT goals.  Pt is now using R hand for self feeding and drinking with fair accuracy/extra time.  Pt is performing all basic self care with modified indep, no longer requires supv from family to manage the steps to reach her bedroom upstairs, and uses her RW in the home with modified indep.  RUE ataxia is improving.  Pt acknowledges that her "aim" is improving when she reaches for ADL supplies.  Pt is now able to pick up small objects from the table or a dish with R hand, but often drops objects, and extra time is needed to manipulate small objects within her hand.  Pt will benefit from continued Occupational Therapy to address RUE weakness and coordination deficits in order to maximize independence and efficiency with ADLs/IADLs/leisure activities.    OT Occupational Profile and History Detailed  Assessment- Review of Records and additional review of physical, cognitive, psychosocial history related to current functional performance    Occupational performance deficits (Please refer to evaluation for details): ADL's;Leisure;IADL's    Body Structure / Function / Physical Skills ADL;Coordination;Endurance;GMC;UE functional use;Balance;Sensation;Body mechanics;IADL;Pain;Dexterity;FMC;Strength;Gait;Mobility    Rehab Potential Good    Clinical Decision Making Several treatment options, min-mod task modification necessary    Comorbidities Affecting Occupational Performance: May have comorbidities impacting occupational performance    Modification or Assistance to Complete Evaluation  Min-Moderate modification of tasks or assist with assess necessary to complete eval    OT Frequency 2x / week     OT Duration 12 weeks    OT Treatment/Interventions Self-care/ADL training;Therapeutic exercise;DME and/or AE instruction;Functional Mobility Training;Balance training;Neuromuscular education;Therapeutic activities;Patient/family education    Consulted and Agree with Plan of Care Patient             Patient will benefit from skilled therapeutic intervention in order to improve the following deficits and impairments:   Body Structure / Function / Physical Skills: ADL, Coordination, Endurance, GMC, UE functional use, Balance, Sensation, Body mechanics, IADL, Pain, Dexterity, FMC, Strength, Gait, Mobility       Visit Diagnosis: Left basal ganglia embolic stroke (HCC)  Muscle weakness (generalized)  Other lack of coordination    Problem List Patient Active Problem List   Diagnosis Date Noted   Carotid stenosis, symptomatic, with infarction (HCC) 11/13/2020   Gout flare 11/10/2020   UTI (urinary tract infection) 11/10/2020   Left carotid artery stenosis 11/10/2020   Chronic kidney disease 11/10/2020   Left basal ganglia embolic stroke (HCC) 10/24/2020   PAC (premature atrial contraction)    Pre-procedural cardiovascular examination    Ischemic stroke (HCC) 10/20/2020   TIA (transient ischemic attack) 10/19/2020   Right hemiparesis (HCC) 10/19/2020   Type 2 diabetes mellitus with stage 3 chronic kidney disease, without long-term current use of insulin (HCC) 08/24/2018   Hyperlipidemia, unspecified 07/19/2017   Hypertension 07/19/2017   PUD (peptic ulcer disease) 07/19/2017   Type 2 diabetes mellitus (HCC) 07/19/2017   Personal history of gout 08/12/2016   Anemia, unspecified 04/09/2016   Hypothyroidism, acquired 12/10/2015   Danelle Earthly, MS, OTR/L  Otis Dials, OT/L 01/05/2021, 2:15 PM  [place] [facility] MAIN [facility] SERVICES [address] [place], [place], [address] Phone: [phone]   Fax:  [phone]  Name: Cassandra Schaefer MRN: [id number] Date of Birth: Jul 07, 1928

## 2021-01-06 ENCOUNTER — Encounter: Payer: Medicare HMO | Admitting: Occupational Therapy

## 2021-01-06 ENCOUNTER — Ambulatory Visit: Payer: Medicare HMO | Admitting: Physical Therapy

## 2021-01-06 NOTE — Therapy (Signed)
[facility] MAIN [facility] SERVICES [address] [place], [place], [address] Phone: [phone]   Fax:  [phone]  Speech Language Pathology Treatment  Patient Details  Name: Cassandra Schaefer MRN: [id number] Date of Birth: [birth year] Referring Provider (SLP): Barbette Reichmann   Encounter Date: 01/05/2021   End of Session - 01/06/21 1456     Visit Number 9    Number of Visits 25    Date for SLP Re-Evaluation 02/27/21    Authorization Type [employer]    Authorization Time Period 12/04/2020 thru 02/27/2021    Authorization - Visit Number 9    Progress Note Due on Visit 10    SLP Start Time 1400    SLP Stop Time  1500    SLP Time Calculation (min) 60 min    Activity Tolerance Patient tolerated treatment well             Past Medical History:  Diagnosis Date   Anemia    Cough    RESOLVING, NO FEVER   Diabetes mellitus without complication (HCC)    Gout    Hypertension    Hypothyroidism    PUD (peptic ulcer disease)    Stroke [facility])    Wears dentures    full upper, partial lower    Past Surgical History:  Procedure Laterality Date   AMPUTATION TOE Left 12/14/2017   Procedure: AMPUTATION TOE-4TH MPJ;  Surgeon: Gwyneth Revels, DPM;  Location: [facility];  Service: Podiatry;  Laterality: Left;  IVA LOCAL Diabetic - oral meds   APPENDECTOMY     CATARACT EXTRACTION W/PHACO Right 06/23/2015   Procedure: CATARACT EXTRACTION PHACO AND INTRAOCULAR LENS PLACEMENT (IOC);  Surgeon: Sallee Lange, MD;  Location: [facility] OR[facility];  Service: Ophthalmology;  Laterality: Right;  [place] 01:28 AP% 23.4 CDE 36.14 fluid pack lot # [id number]   CATARACT EXTRACTION W/PHACO Left 07/28/2015   Procedure: CATARACT EXTRACTION PHACO AND INTRAOCULAR LENS PLACEMENT (IOC);  Surgeon: Sallee Lange, MD;  Location: [facility];  Service: Ophthalmology;  Laterality: Left;  [place]    1:29.7 AP%  24.9 CDE   41.32 fluid casette lot #[id number] H exp 09/23/2016   COONOSCOPY      AND ENDOSCOPY   DILATION AND CURETTAGE OF UTERUS     ENDARTERECTOMY Left 11/13/2020   Procedure: Evacuation of left neck hematoma;  Surgeon: Renford Dills, MD;  Location: [facility];  Service: Vascular;  Laterality: Left;   ENDARTERECTOMY Left 11/13/2020   Procedure: ENDARTERECTOMY CAROTID;  Surgeon: Annice Needy, MD;  Location: [facility];  Service: Vascular;  Laterality: Left;   TONSILLECTOMY     TUBAL LIGATION      There were no vitals filed for this visit.   Subjective Assessment - 01/06/21 1455     Subjective "I feel good today"    Currently in Pain? No/denies                   ADULT SLP TREATMENT - 01/06/21 0001       Treatment Provided   Treatment provided Cognitive-Linquistic      Cognitive-Linquistic Treatment   Treatment focused on Aphasia;Dysarthria;Patient/family/caregiver education    Skilled Treatment DYSARTHRIA: MOD I for speech intelligibility strategies to produce> 95% speech intelligibility at the complex conversation level              SLP Education - 01/06/21 1456     Education Details provided on progress and level of speech intelligibility    Person(s) Educated Patient  Methods Explanation    Comprehension Verbalized understanding              SLP Short Term Goals - 12/05/20 1542       SLP SHORT TERM GOAL #1   Title Pt will list 10 items within semi-complex category over 3 consecutive sessions.    Baseline new goal    Time 10   sessions   Status New      SLP SHORT TERM GOAL #2   Title With supervision cues, pt will utilize speech intelligibility strategies at the connected sentence level to achieve > 95% speech intelligibility.    Baseline new goal    Time 10    Period --   sessions   Status New      SLP SHORT TERM GOAL #3   Title Pt will read at the paragraph level with > 75% speech intelligibility.    Baseline new goal    Time 10    Period --   session   Status New              SLP Long Term Goals -  12/05/20 1544       SLP LONG TERM GOAL #1   Title Pt will utilize word finding strategies to effectively communicate abstract/coomplex ideas/information.    Baseline new goal    Time 12    Period Weeks    Status New    Target Date 02/27/21      SLP LONG TERM GOAL #2   Title Pt will speech intelligibility strategies to achieve > 95% speech intelligibility during complex conversation.    Baseline new goal    Period Weeks    Status New    Target Date 02/27/21              Plan - 01/06/21 1457     Clinical Impression Statement Pt presents with improving dysarthria and increased rate control of speech. As a result, pt's speech intelligibility is 95% intelligible at the conversation level within a mildly noisy environment. Discharge for next session.    Speech Therapy Frequency 2x / week    Duration 12 weeks    Treatment/Interventions Language facilitation;Functional tasks;SLP instruction and feedback;Patient/family education    Potential to Achieve Goals Good    Consulted and Agree with Plan of Care Patient             Patient will benefit from skilled therapeutic intervention in order to improve the following deficits and impairments:   Aphasia  Dysarthria and anarthria  Left basal ganglia embolic stroke [facility])    Problem List Patient Active Problem List   Diagnosis Date Noted   Carotid stenosis, symptomatic, with infarction (HCC) 11/13/2020   Gout flare 11/10/2020   UTI (urinary tract infection) 11/10/2020   Left carotid artery stenosis 11/10/2020   Chronic kidney disease 11/10/2020   Left basal ganglia embolic stroke (HCC) 10/24/2020   PAC (premature atrial contraction)    Pre-procedural cardiovascular examination    Ischemic stroke (HCC) 10/20/2020   TIA (transient ischemic attack) 10/19/2020   Right hemiparesis (HCC) 10/19/2020   Type 2 diabetes mellitus with stage 3 chronic kidney disease, without long-term current use of insulin (HCC) 08/24/2018    Hyperlipidemia, unspecified 07/19/2017   Hypertension 07/19/2017   PUD (peptic ulcer disease) 07/19/2017   Type 2 diabetes mellitus (HCC) 07/19/2017   Personal history of gout 08/12/2016   Anemia, unspecified 04/09/2016   Hypothyroidism, acquired 12/10/2015   Jaque Dacy B. Dreama Saa, M.S., CCC-SLP,  [facility] Office [phone]  [name] 01/06/2021, 2:58 PM  [facility] MAIN [facility] SERVICES [address] [place], [place], [address] Phone: [phone]   Fax:  [phone]   Name: Cassandra Schaefer MRN: [id number] Date of Birth: 20-Sep-1928

## 2021-01-07 ENCOUNTER — Other Ambulatory Visit: Payer: Self-pay

## 2021-01-07 ENCOUNTER — Ambulatory Visit: Payer: Medicare HMO

## 2021-01-07 ENCOUNTER — Ambulatory Visit: Payer: Medicare HMO | Admitting: Speech Pathology

## 2021-01-07 ENCOUNTER — Ambulatory Visit: Payer: Medicare HMO | Admitting: Physical Therapy

## 2021-01-07 DIAGNOSIS — R4701 Aphasia: Secondary | ICD-10-CM | POA: Diagnosis not present

## 2021-01-07 DIAGNOSIS — I639 Cerebral infarction, unspecified: Secondary | ICD-10-CM

## 2021-01-07 DIAGNOSIS — M6281 Muscle weakness (generalized): Secondary | ICD-10-CM

## 2021-01-07 DIAGNOSIS — R471 Dysarthria and anarthria: Secondary | ICD-10-CM

## 2021-01-07 DIAGNOSIS — R2689 Other abnormalities of gait and mobility: Secondary | ICD-10-CM

## 2021-01-07 DIAGNOSIS — R2681 Unsteadiness on feet: Secondary | ICD-10-CM

## 2021-01-07 DIAGNOSIS — R278 Other lack of coordination: Secondary | ICD-10-CM

## 2021-01-07 NOTE — Therapy (Signed)
[facility] MAIN [facility] SERVICES [address] [place], [place], [address] Phone: [phone]   Fax:  [phone]  Occupational Therapy Treatment  Patient Details  Name: Cassandra Schaefer MRN: [id number] Date of Birth: 1928-12-04 Referring Provider (OT): Dr. Barbette Reichmann   Encounter Date: 01/07/2021   OT End of Session - 01/07/21 1624     Visit Number 11    Number of Visits 24    Date for OT Re-Evaluation 02/01/21    Authorization Time Period Reporting period starting 11/10/2020    OT Start Time 1510    OT Stop Time 1555    OT Time Calculation (min) 45 min    Equipment Utilized During Treatment wc    Activity Tolerance Patient tolerated treatment well    Behavior During Therapy WFL for tasks assessed/performed             Past Medical History:  Diagnosis Date   Anemia    Cough    RESOLVING, NO FEVER   Diabetes mellitus without complication (HCC)    Gout    Hypertension    Hypothyroidism    PUD (peptic ulcer disease)    Stroke [facility])    Wears dentures    full upper, partial lower    Past Surgical History:  Procedure Laterality Date   AMPUTATION TOE Left 12/14/2017   Procedure: AMPUTATION TOE-4TH MPJ;  Surgeon: Gwyneth Revels, DPM;  Location: [facility];  Service: Podiatry;  Laterality: Left;  IVA LOCAL Diabetic - oral meds   APPENDECTOMY     CATARACT EXTRACTION W/PHACO Right 06/23/2015   Procedure: CATARACT EXTRACTION PHACO AND INTRAOCULAR LENS PLACEMENT (IOC);  Surgeon: Sallee Lange, MD;  Location: [facility];  Service: Ophthalmology;  Laterality: Right;  [place] 01:28 AP% 23.4 CDE 36.14 fluid pack lot # [id number]   CATARACT EXTRACTION W/PHACO Left 07/28/2015   Procedure: CATARACT EXTRACTION PHACO AND INTRAOCULAR LENS PLACEMENT (IOC);  Surgeon: Sallee Lange, MD;  Location: [facility];  Service: Ophthalmology;  Laterality: Left;  [place]    1:29.7 AP%  24.9 CDE   41.32 fluid casette lot #[id number] H exp 09/23/2016    COONOSCOPY     AND ENDOSCOPY   DILATION AND CURETTAGE OF UTERUS     ENDARTERECTOMY Left 11/13/2020   Procedure: Evacuation of left neck hematoma;  Surgeon: Renford Dills, MD;  Location: [facility];  Service: Vascular;  Laterality: Left;   ENDARTERECTOMY Left 11/13/2020   Procedure: ENDARTERECTOMY CAROTID;  Surgeon: Annice Needy, MD;  Location: [facility];  Service: Vascular;  Laterality: Left;   TONSILLECTOMY     TUBAL LIGATION      There were no vitals filed for this visit.   Subjective Assessment - 01/07/21 1622     Subjective  "I cut myself a little bit when I was making a salad."    Patient is accompanied by: Family member    Pertinent History R ankle pain, gout, T2DM, HTN, CKDIII; R/L carotid endarectomy    Patient Stated Goals "I want to improve my aim when I'm using my R arm."    Currently in Pain? No/denies    Pain Score 0-No pain    Pain Onset In the past 7 days            Occupational Therapy Treatment: Therapeutic Activity: Facilitated [facility] activities for R hand with 2 table top games, including dominoes and playing cards.  Pt with fair ability to flip, rotate, pick up, and place dominoes, with occasional dropping and  bumping of near by dominoes.  With playing cards, pt was able to deal 7 cards to 3 players with extra time (not yet able to shuffle), stored cards in L hand (unaffected side), and drew and placed cards with R hand.  Pt required intermittent assist to stabilize deck when drawing a top card, therapist occasionally placed top card slightly hanging over the deck for easier pick up.  Card game required moving a line of cards from 1 location to another on the table top, with pt having to move a 1-3 cards at a time d/t inability to grasp a line of 5+ cards without disrupting the card sequence d/t coordination deficits.  Worked with grooved pegs placing pegs into grooved pegboard, with pt demonstrating improved ability to slide pegs from her palm to the tips of her  fingers, position appropriately within fingertips, and turn them as needed between IF and thumb to line up the grooves with extra time and occasional dropping of pegs, though improved from last session.   Response to Treatment: See Plan/clinical impression below.     OT Education - 01/07/21 1623     Education Details ensure increased focus and slower pace when using sharp knives d/t decreased sensation, try to avoid dual tasking when using a sharp knife    Person(s) Educated Patient    Methods Explanation;Verbal cues    Comprehension Verbalized understanding              OT Short Term Goals - 01/05/21 1351       OT SHORT TERM GOAL #1   Title Pt will perform HEP for RUE strength/coordination independently.    Baseline Eval: HEP not yet established in outpatient, but pt is working with putty from [name]; 12/04/2020; [facility] HEP initiated this day with mod vc after return demo; 01/05/2021: indep with currently program, but ongoing as pt progresses    Time 6    Period Weeks    Status On-going    Target Date 01/22/21               OT Long Term Goals - 01/05/21 1352       OT LONG TERM GOAL #1   Title Pt will improve hand writing to 100% legibility with R hand to be able to independently write a check.    Baseline Eval: signature is 75% legible with R hand, requiring extra time; 12/04/2020: no change from eval; 01/05/2021: Printing is 100% legible, signature is 90% legible, but still requires extra time and effort.    Time 12    Period Weeks    Status On-going    Target Date 02/01/21      OT LONG TERM GOAL #2   Title Pt will improve R hand coordination to enable good manipulation of iphone using R dominant hand    Baseline Eval: Pt uses L hand d/t R hand lacking sufficient FMC to press buttons on phone; 12/04/2020: no change from eval; 01/05/2021: R hand coordination is improving but pt still uses L hand to dial phone.    Time 12    Period Weeks    Status On-going    Target Date  02/01/21      OT LONG TERM GOAL #3   Title Pt will improve GMC throughout RUE to enable pt to reach for and pick up ADL supplies with R dominant hand without dropping or knocking over objects.    Baseline Eval: Pt reports RUE is clumsy and easily knocks over objects  when trying to reach for ADL supplies.  Pt verbalizes that her "aim is off."; 12/04/2020: pt reports slight improvement since eval and has started using her R hand to eat, but still feels clumsy; 01/05/2021: Greatly improved; pt is using silverware to eat with R hand, can comb hair with RUE, still mildly ataxic    Time 12    Period Weeks    Status On-going    Target Date 02/01/21      OT LONG TERM GOAL #4   Title Pt will improve FOTO score to 68 or better to indicate measurable functional improvement.    Baseline Eval: FOTO 55; 01/05/2021: FOTO 61    Time 12    Period Weeks    Status On-going    Target Date 02/01/21      OT LONG TERM GOAL #5   Title Pt will perform dynamic standing tasks with modified indep, AD as needed in order to reduce fall risk with ADLs    Baseline Eval: CGA-min A for all dynamic standing tasks using RW; 12/04/2020: pt is using RW in home with distant supv, but family still provides close supv on stairs; 01/05/2021: Pt now using RW to amb in and around the house, can perform ADLs with RW and modified indep, and can manage stairs at home with modified indep.  No falls reported.    Time 12    Period Weeks    Status Achieved    Target Date 02/01/21                   Plan - 01/07/21 1640     Clinical Impression Statement Pt continues to show improved motor control with [facility] tasks.  Pt actively engages RUE at home with all self care.  She reports that she was cutting vegetables for a salad last night and accidentally cut her finger on her left hand.  OT praised pt for being able to cut vegetables using R hand, however, OT reinforced importance of avoiding any dual tasking when working with sharp objects,  ensuring a slow pace when cutting, and ensuring hyper focus on task at hand d/t sensory deficits in fingertips.  Pt will benefit from continued Occupational Therapy to address RUE weakness and coordination deficits in order to maximize indep with ADLs/IADLs/leisure.    OT Occupational Profile and History Detailed Assessment- Review of Records and additional review of physical, cognitive, psychosocial history related to current functional performance    Occupational performance deficits (Please refer to evaluation for details): ADL's;Leisure;IADL's    Body Structure / Function / Physical Skills ADL;Coordination;Endurance;GMC;UE functional use;Balance;Sensation;Body mechanics;IADL;Pain;Dexterity;FMC;Strength;Gait;Mobility    Rehab Potential Good    Clinical Decision Making Several treatment options, min-mod task modification necessary    Comorbidities Affecting Occupational Performance: May have comorbidities impacting occupational performance    Modification or Assistance to Complete Evaluation  Min-Moderate modification of tasks or assist with assess necessary to complete eval    OT Frequency 2x / week    OT Duration 12 weeks    OT Treatment/Interventions Self-care/ADL training;Therapeutic exercise;DME and/or AE instruction;Functional Mobility Training;Balance training;Neuromuscular education;Therapeutic activities;Patient/family education    Consulted and Agree with Plan of Care Patient             Patient will benefit from skilled therapeutic intervention in order to improve the following deficits and impairments:   Body Structure / Function / Physical Skills: ADL, Coordination, Endurance, GMC, UE functional use, Balance, Sensation, Body mechanics, IADL, Pain, Dexterity, FMC, Strength, Gait, Mobility  Visit Diagnosis: Left basal ganglia embolic stroke [facility])  Muscle weakness (generalized)  Other lack of coordination    Problem List Patient Active Problem List   Diagnosis  Date Noted   Carotid stenosis, symptomatic, with infarction (HCC) 11/13/2020   Gout flare 11/10/2020   UTI (urinary tract infection) 11/10/2020   Left carotid artery stenosis 11/10/2020   Chronic kidney disease 11/10/2020   Left basal ganglia embolic stroke (HCC) 10/24/2020   PAC (premature atrial contraction)    Pre-procedural cardiovascular examination    Ischemic stroke (HCC) 10/20/2020   TIA (transient ischemic attack) 10/19/2020   Right hemiparesis (HCC) 10/19/2020   Type 2 diabetes mellitus with stage 3 chronic kidney disease, without long-term current use of insulin (HCC) 08/24/2018   Hyperlipidemia, unspecified 07/19/2017   Hypertension 07/19/2017   PUD (peptic ulcer disease) 07/19/2017   Type 2 diabetes mellitus (HCC) 07/19/2017   Personal history of gout 08/12/2016   Anemia, unspecified 04/09/2016   Hypothyroidism, acquired 12/10/2015   Danelle Earthly, MS, OTR/L  Otis Dials, OT/L 01/07/2021, 4:40 PM  [facility] MAIN [facility] SERVICES [address] [place], [place], [address] Phone: [phone]   Fax:  [phone]  Name: Cassandra Schaefer MRN: [id number] Date of Birth: 08-Sep-1928

## 2021-01-07 NOTE — Therapy (Signed)
[facility] MAIN [facility] SERVICES [address] [place], [place], [address] Phone: [phone]   Fax:  [phone]  Physical Therapy Treatment  Patient Details  Name: Cassandra Schaefer MRN: [id number] Date of Birth: Jun 11, 1928 Referring Provider (PT): Dr. Dew/Dr. Marcello Fennel PCP   Encounter Date: 01/07/2021   PT End of Session - 01/07/21 1707     Visit Number 8    Number of Visits 17    Date for PT Re-Evaluation 01/28/21    Authorization Type Medicare    Authorization Time Period 12/02/20 start of care    PT Start Time 1600    PT Stop Time 1645    PT Time Calculation (min) 45 min    Equipment Utilized During Treatment Gait belt    Activity Tolerance Patient tolerated treatment well    Behavior During Therapy WFL for tasks assessed/performed             Past Medical History:  Diagnosis Date   Anemia    Cough    RESOLVING, NO FEVER   Diabetes mellitus without complication (HCC)    Gout    Hypertension    Hypothyroidism    PUD (peptic ulcer disease)    Stroke [facility])    Wears dentures    full upper, partial lower    Past Surgical History:  Procedure Laterality Date   AMPUTATION TOE Left 12/14/2017   Procedure: AMPUTATION TOE-4TH MPJ;  Surgeon: Gwyneth Revels, DPM;  Location: [facility];  Service: Podiatry;  Laterality: Left;  IVA LOCAL Diabetic - oral meds   APPENDECTOMY     CATARACT EXTRACTION W/PHACO Right 06/23/2015   Procedure: CATARACT EXTRACTION PHACO AND INTRAOCULAR LENS PLACEMENT (IOC);  Surgeon: Sallee Lange, MD;  Location: [facility];  Service: Ophthalmology;  Laterality: Right;  [place] 01:28 AP% 23.4 CDE 36.14 fluid pack lot # [id number]   CATARACT EXTRACTION W/PHACO Left 07/28/2015   Procedure: CATARACT EXTRACTION PHACO AND INTRAOCULAR LENS PLACEMENT (IOC);  Surgeon: Sallee Lange, MD;  Location: [facility];  Service: Ophthalmology;  Laterality: Left;  [place]    1:29.7 AP%  24.9 CDE   41.32 fluid casette lot #[id number] H exp  09/23/2016   COONOSCOPY     AND ENDOSCOPY   DILATION AND CURETTAGE OF UTERUS     ENDARTERECTOMY Left 11/13/2020   Procedure: Evacuation of left neck hematoma;  Surgeon: Renford Dills, MD;  Location: [facility];  Service: Vascular;  Laterality: Left;   ENDARTERECTOMY Left 11/13/2020   Procedure: ENDARTERECTOMY CAROTID;  Surgeon: Annice Needy, MD;  Location: [facility];  Service: Vascular;  Laterality: Left;   TONSILLECTOMY     TUBAL LIGATION      There were no vitals filed for this visit.   Subjective Assessment - 01/07/21 1604     Subjective Pt reports continuing to have improved ability to recover balance in home envirnomnemt. no falls or other significant changes since previous session.    Pertinent History 87 y.o. female with medical history significant for type 2 diabetes mellitus, essential hypertension, acquired hypothyroidism, hyperlipidemia, stage III chronic kidney disease reports increased right sided weakness on 10/19/20. She was diagnosed with left basal ganglia ischemic stroke. She was discharged to inpatient rehab for 10 days and then discharged home. Patient was evaluated by outpatient PT on 11/12/20. CVA was due to carotid stenosis. She underwent left carotid endartectomy on 7/21. Procedure went well however patient suffered from left cervical hematoma and had subsequent surgical procedure to stop the bleed and remove  the hematoma on 11/13/20. They were concerned with her breathing and therefore intubated her for 1 day, she was in ICU for 2 days and transferred to med/surge on 11/15/20. Patient was discharged home on 11/17/20 and is now returning to outpatient PT. She has had the stiches removed and is healing well. Denies any neck pain or stiffness. She lives with her daughter and has 24/7 caregiver support. She is still using RW for most ambulation. She is able to transfer to bedside commode well and is able to stand short time unsupported. She reports feeling very weak and fatigued. She  reports she has been dragging her right foot more. She denies any numbness/tingling; She reports feeling like her legs are wanting to do more but is hesitant because of fear of falling. She is mod I for self care morning routine. She does still receive help with showering. She has been working on increasing her activity but denies any formal exercise.    Limitations Standing;Walking    How long can you sit comfortably? NA    How long can you stand comfortably? 30-60 min with support    How long can you walk comfortably? about 100 feet with RW, still limited to short distances;    Diagnostic tests MRI shows left basal ganglia infarct 10/21/20    Patient Stated Goals "Improve balance and walking and not need RW, get back to independent."    Currently in Pain? No/denies    Pain Score 0-No pain             Treatment provided this session  Warm up on Nustep BLE/BUE level 2 x4 min -fatigue noted around minute 3. Brief rest break required     Standing in parallel bars: 2.5# ankle weight donned for all of the following  -alternate march x12 reps each LE -BLE heel raises x12 reps -hip extension SLR x12 reps with each LE and BUE A with cues for knee extended for better hip strengthening -hip abduction SLR x12 reps each with BUE rail assist with cues to avoid hip ER for better LE strengthening; -Patient demonstrated increased fatigue around repetition 8 she began to utilize external rotation to assist with the movement on R -Side stepping x15 feet x1 laps each direction, no rail assist with CGA for safety; Patient able to take better step with verbal cues;   STS: 2 airex pad on seat 2 x 5 reps, no UE assistance  -CGA to prevent LOB upon standing  Pt educated throughout session regarding proper techniques with exercises with both verbal and tactile cues.    Approximately 30 feet of ambulation with rolling walker 2 stairs.  Ascend/descend 4 steps with B rail assist x 1 sets -Patient  negotiated 1 step at a time utilizing step to gait pattern.  This is also following around 25 feet ambulation with a rolling walker to get to our stairs in the clinic.  Approximately 30 feet ambulation with rolling walker back to [place] seated rest area.   Gait around gym x50 feet x2 with RW; initially patient able to exhibit good heel strike on RLE with better reciprocal gait pattern.   NMR: Standing on airex pad:                         -feet apart, tapping cones with hand unsupported x3 reps x2 sets with min A for safety             -Alternate toe taps  airex to 6 inch step unsupported x10 reps each             -Standing one foot on airex, one foot on 6 inch step:                         Unsupported standing 15 sec hold on ea LE   Unsupported standing with 5 anterior reaches toward objects therapist was holding.  Patient had difficulty with forward reach and attempted to utilize object PT was holding in order to stabilize    -Performed with the each lower extremity as the stance leg on the Airex pad.                             Unless otherwise stated, CGA was provided and gait belt donned in order to ensure pt safety Pt required occasional rest breaks due fatigue, PT was quick to ask when pt appeared to be fatiguing in order to prevent excessive fatigue.   Note: Portions of this document were prepared using Dragon voice recognition software and although reviewed may contain unintentional dictation errors in syntax, grammar, or spelling.                             PT Education - 01/07/21 1606     Education Details technique for strength and balance    Person(s) Educated Patient    Methods Explanation;Verbal cues    Comprehension Verbalized understanding;Returned demonstration;Verbal cues required;Tactile cues required;Need further instruction              PT Short Term Goals - 12/03/20 0827       PT SHORT TERM GOAL #1   Title Patient will be adherent to  HEP at least 3x a week to improve functional strength and balance for better safety at home.    Time 4    Period Weeks    Status New    Target Date 12/31/20      PT SHORT TERM GOAL #2   Title Patient will be independent in transferring sit<>Stand without pushing on arm rests to improve ability to get up from chair.    Time 4    Period Weeks    Status New    Target Date 12/31/20               PT Long Term Goals - 12/03/20 0828       PT LONG TERM GOAL #1   Title Patient (> 31 years old) will complete five times sit to stand test in < 15 seconds indicating an increased LE strength and improved balance.    Time 8    Period Weeks    Status New    Target Date 01/28/21      PT LONG TERM GOAL #2   Title Patient will increase six minute walk test distance to >400 feet for improve gait ability    Time 8    Period Weeks    Status New    Target Date 01/28/21      PT LONG TERM GOAL #3   Title Patient will increase 10 meter walk test to >1.63m/s as to improve gait speed for better community ambulation and to reduce fall risk.    Time 8    Period Weeks    Status New    Target Date 01/28/21  PT LONG TERM GOAL #4   Title Patient will be independent with ascend/descend 6 steps using single UE in step over step pattern without LOB.    Time 8    Period Weeks    Status New    Target Date 01/28/21      PT LONG TERM GOAL #5   Title Patient will demonstrate an improved Berg Balance Score of >45/56 as to demonstrate improved balance with ADLs such as sitting/standing and transfer balance and reduced fall risk.    Time 8    Period Weeks    Status New    Target Date 01/28/21      PT LONG TERM GOAL #6   Title Patient will improve FOTO score to >60% to indicate improved functional mobility with ADLs.    Time 8    Period Weeks    Status New    Target Date 01/28/21                   Plan - 01/07/21 1708     Clinical Impression Statement Patient demonstrated good  tolerance for therapeutic exercise and neuromuscular training today.  Towards beginning of session.  Patient demonstrated some stiffness.  Patient also appeared to be slightly fatigued in today's session compared to previous visit sessions.  Patient able to a send and descend stairs again and therapy today but unable to demonstrate reciprocal pattern.  Patient demonstrated walking for approximately 50 feet within clinic without requiring rest break with assistance of a rolling walker.  We will continue to progress balance exercise including stair training and reduced upper extremity assistance in future sessions.  Patient will continue to benefit from skilled physical therapy intervention to improve strength balance mobility and quality of life.    Personal Factors and Comorbidities Age;Comorbidity 3+;Fitness    Comorbidities HTN, CKD, fall risk, type 2 diabetes,    Examination-Activity Limitations Lift;Locomotion Level;Squat;Stairs;Stand;Toileting;Transfers;Bathing    Examination-Participation Restrictions Cleaning;Community Activity;Laundry;Meal Prep;Shop;Volunteer;Driving    Stability/Clinical Decision Making Stable/Uncomplicated    Clinical Decision Making Low    Rehab Potential Good    PT Frequency 2x / week    PT Duration 8 weeks    PT Treatment/Interventions Cryotherapy;Electrical Stimulation;Moist Heat;Gait training;Stair training;Functional mobility training;Therapeutic activities;Therapeutic exercise;Neuromuscular re-education;Balance training;Patient/family education;Orthotic Fit/Training;Energy conservation    PT Next Visit Plan work on LE strengthening and balance;    PT Home Exercise Plan Initiated, see patient instructions    Consulted and Agree with Plan of Care Patient             Patient will benefit from skilled therapeutic intervention in order to improve the following deficits and impairments:  Abnormal gait, Decreased balance, Decreased endurance, Decreased mobility,  Difficulty walking, Cardiopulmonary status limiting activity, Decreased activity tolerance, Decreased coordination, Decreased strength  Visit Diagnosis: Other abnormalities of gait and mobility  Unsteadiness on feet  Muscle weakness (generalized)     Problem List Patient Active Problem List   Diagnosis Date Noted   Carotid stenosis, symptomatic, with infarction (HCC) 11/13/2020   Gout flare 11/10/2020   UTI (urinary tract infection) 11/10/2020   Left carotid artery stenosis 11/10/2020   Chronic kidney disease 11/10/2020   Left basal ganglia embolic stroke (HCC) 10/24/2020   PAC (premature atrial contraction)    Pre-procedural cardiovascular examination    Ischemic stroke (HCC) 10/20/2020   TIA (transient ischemic attack) 10/19/2020   Right hemiparesis (HCC) 10/19/2020   Type 2 diabetes mellitus with stage 3 chronic kidney disease, without long-term current use of  insulin (HCC) 08/24/2018   Hyperlipidemia, unspecified 07/19/2017   Hypertension 07/19/2017   PUD (peptic ulcer disease) 07/19/2017   Type 2 diabetes mellitus (HCC) 07/19/2017   Personal history of gout 08/12/2016   Anemia, unspecified 04/09/2016   Hypothyroidism, acquired 12/10/2015    Norman Herrlich, PT 01/07/2021, 5:12 PM  [facility] MAIN [facility] SERVICES [address] [place], [place], [address] Phone: [phone]   Fax:  [phone]  Name: Cassandra Schaefer MRN: [id number] Date of Birth: 11-10-1928

## 2021-01-08 ENCOUNTER — Other Ambulatory Visit (INDEPENDENT_AMBULATORY_CARE_PROVIDER_SITE_OTHER): Payer: Self-pay | Admitting: Vascular Surgery

## 2021-01-08 ENCOUNTER — Ambulatory Visit: Payer: Medicare HMO | Admitting: Physical Therapy

## 2021-01-08 ENCOUNTER — Encounter: Payer: Medicare HMO | Admitting: Occupational Therapy

## 2021-01-08 NOTE — Therapy (Signed)
[place] MAIN [facility] SERVICES [address] [place], [place], [address] Phone: [phone]   Fax:  [phone]  Speech Language Pathology Treatment DISCHARGE SUMMARY  Patient Details  Name: Cassandra Schaefer MRN: [id number] Date of Birth: 06-Nov-1928 Referring Provider (SLP): Tracie Harrier   Encounter Date: 01/07/2021   End of Session - 01/08/21 1642     Visit Number 10    Number of Visits 25    Date for SLP Re-Evaluation 02/27/21    Authorization Type [employer]    Authorization Time Period 12/04/2020 thru 02/27/2021    Authorization - Visit Number 10    Progress Note Due on Visit 10    SLP Start Time 1400    SLP Stop Time  1500    SLP Time Calculation (min) 60 min    Activity Tolerance Patient tolerated treatment well             Past Medical History:  Diagnosis Date   Anemia    Cough    RESOLVING, NO FEVER   Diabetes mellitus without complication ([place])    Gout    Hypertension    Hypothyroidism    PUD (peptic ulcer disease)    Stroke [facility])    Wears dentures    full upper, partial lower    Past Surgical History:  Procedure Laterality Date   AMPUTATION TOE Left 12/14/2017   Procedure: AMPUTATION TOE-4TH MPJ;  Surgeon: Samara Deist, DPM;  Location: [place];  Service: Podiatry;  Laterality: Left;  IVA LOCAL Diabetic - oral meds   APPENDECTOMY     CATARACT EXTRACTION W/PHACO Right 06/23/2015   Procedure: CATARACT EXTRACTION PHACO AND INTRAOCULAR LENS PLACEMENT (IOC);  Surgeon: Estill Cotta, MD;  Location: [facility];  Service: Ophthalmology;  Laterality: Right;  [place] 01:28 AP% 23.4 CDE 36.14 fluid pack lot # [id number]   CATARACT EXTRACTION W/PHACO Left 07/28/2015   Procedure: CATARACT EXTRACTION PHACO AND INTRAOCULAR LENS PLACEMENT (IOC);  Surgeon: Estill Cotta, MD;  Location: [facility];  Service: Ophthalmology;  Laterality: Left;  [place]    1:29.7 AP%  24.9 CDE   41.32 fluid casette lot #[id number] H exp  09/23/2016   COONOSCOPY     AND ENDOSCOPY   DILATION AND CURETTAGE OF UTERUS     ENDARTERECTOMY Left 11/13/2020   Procedure: Evacuation of left neck hematoma;  Surgeon: Katha Cabal, MD;  Location: [facility];  Service: Vascular;  Laterality: Left;   ENDARTERECTOMY Left 11/13/2020   Procedure: ENDARTERECTOMY CAROTID;  Surgeon: Algernon Huxley, MD;  Location: [facility];  Service: Vascular;  Laterality: Left;   TONSILLECTOMY     TUBAL LIGATION      There were no vitals filed for this visit.   Subjective Assessment - 01/08/21 1248     Subjective "I am having a good day"    Currently in Pain? No/denies                   ADULT SLP TREATMENT - 01/08/21 0001       Treatment Provided   Treatment provided Cognitive-Linquistic      Cognitive-Linquistic Treatment   Treatment focused on Aphasia;Dysarthria;Patient/family/caregiver education    Skilled Treatment DYSARTHRIA: MOD I for speech intelligibility strategies to produce> 95% speech intelligibility at the complex conversation level              SLP Education - 01/08/21 1642     Education Details completed    Person(s) Educated Patient    Methods  Explanation    Comprehension Returned demonstration;Verbalized understanding              SLP Short Term Goals - 01/08/21 1643       SLP SHORT TERM GOAL #1   Title Pt will list 10 items within semi-complex category over 3 consecutive sessions.    Status Achieved      SLP SHORT TERM GOAL #2   Title With supervision cues, pt will utilize speech intelligibility strategies at the connected sentence level to achieve > 95% speech intelligibility.    Status Achieved      SLP SHORT TERM GOAL #3   Title Pt will read at the paragraph level with > 75% speech intelligibility.    Status Achieved              SLP Long Term Goals - 01/08/21 1644       SLP LONG TERM GOAL #1   Title Pt will utilize word finding strategies to effectively communicate abstract/coomplex  ideas/information.    Status Achieved      SLP LONG TERM GOAL #2   Title Pt will speech intelligibility strategies to achieve > 95% speech intelligibility during complex conversation.    Status Achieved              Plan - 01/08/21 1642     Clinical Impression Statement Pt has made great progress over the course of skilled ST sessions. As a result she has successfully met all of her goals. Her speech intelligibility is > 95% during conversation. All education has been completed.    Consulted and Agree with Plan of Care Patient             Problem List Patient Active Problem List   Diagnosis Date Noted   Carotid stenosis, symptomatic, with infarction ([place]) 11/13/2020   Gout flare 11/10/2020   UTI (urinary tract infection) 11/10/2020   Left carotid artery stenosis 11/10/2020   Chronic kidney disease 11/10/2020   Left basal ganglia embolic stroke ([place]) 59/74/1638   PAC (premature atrial contraction)    Pre-procedural cardiovascular examination    Ischemic stroke ([place]) 10/20/2020   TIA (transient ischemic attack) 10/19/2020   Right hemiparesis ([place]) 10/19/2020   Type 2 diabetes mellitus with stage 3 chronic kidney disease, without long-term current use of insulin ([place]) 08/24/2018   Hyperlipidemia, unspecified 07/19/2017   Hypertension 07/19/2017   PUD (peptic ulcer disease) 07/19/2017   Type 2 diabetes mellitus ([place]) 07/19/2017   Personal history of gout 08/12/2016   Anemia, unspecified 04/09/2016   Hypothyroidism, acquired 12/10/2015   Darrnell Mangiaracina B. Rutherford Nail M.S., CCC-SLP, [place] Pathologist Rehabilitation Services Office [phone]  Stormy Fabian 01/08/2021, 4:44 PM  [place] MAIN [facility] SERVICES [address] [place], [place], [address] Phone: [phone]   Fax:  [phone]   Name: Cassandra Schaefer MRN: [id number] Date of Birth: 05-03-1928

## 2021-01-12 ENCOUNTER — Ambulatory Visit: Payer: Medicare HMO | Admitting: Speech Pathology

## 2021-01-12 ENCOUNTER — Encounter: Payer: Self-pay | Admitting: Occupational Therapy

## 2021-01-12 ENCOUNTER — Ambulatory Visit: Payer: Medicare HMO

## 2021-01-12 ENCOUNTER — Other Ambulatory Visit: Payer: Self-pay

## 2021-01-12 ENCOUNTER — Ambulatory Visit: Payer: Medicare HMO | Admitting: Occupational Therapy

## 2021-01-12 DIAGNOSIS — R278 Other lack of coordination: Secondary | ICD-10-CM

## 2021-01-12 DIAGNOSIS — I639 Cerebral infarction, unspecified: Secondary | ICD-10-CM

## 2021-01-12 DIAGNOSIS — R2681 Unsteadiness on feet: Secondary | ICD-10-CM

## 2021-01-12 DIAGNOSIS — M6281 Muscle weakness (generalized): Secondary | ICD-10-CM

## 2021-01-12 DIAGNOSIS — R482 Apraxia: Secondary | ICD-10-CM

## 2021-01-12 DIAGNOSIS — R4701 Aphasia: Secondary | ICD-10-CM | POA: Diagnosis not present

## 2021-01-12 DIAGNOSIS — R2689 Other abnormalities of gait and mobility: Secondary | ICD-10-CM

## 2021-01-12 NOTE — Therapy (Signed)
[place] [facility] MAIN [facility] SERVICES [address] [place], [place], [address] Phone: [phone]   Fax:  [phone]  Physical Therapy Treatment  Patient Details  Name: Cassandra Schaefer MRN: [id number] Date of Birth: June 19, 1928 Referring Provider (PT): Dr. Dew/Dr. Marcello Fennel PCP   Encounter Date: 01/12/2021   PT End of Session - 01/12/21 1611     Visit Number 9    Number of Visits 17    Date for PT Re-Evaluation 01/28/21    Authorization Type [employer]    Authorization Time Period 12/02/20-01/28/21    PT Start Time 1605    PT Stop Time 1643    PT Time Calculation (min) 38 min    Equipment Utilized During Treatment Gait belt    Activity Tolerance Patient tolerated treatment well    Behavior During Therapy WFL for tasks assessed/performed             Past Medical History:  Diagnosis Date   Anemia    Cough    RESOLVING, NO FEVER   Diabetes mellitus without complication (HCC)    Gout    Hypertension    Hypothyroidism    PUD (peptic ulcer disease)    Stroke [facility])    Wears dentures    full upper, partial lower    Past Surgical History:  Procedure Laterality Date   AMPUTATION TOE Left 12/14/2017   Procedure: AMPUTATION TOE-4TH MPJ;  Surgeon: Gwyneth Revels, DPM;  Location: [facility];  Service: Podiatry;  Laterality: Left;  IVA LOCAL Diabetic - oral meds   APPENDECTOMY     CATARACT EXTRACTION W/PHACO Right 06/23/2015   Procedure: CATARACT EXTRACTION PHACO AND INTRAOCULAR LENS PLACEMENT (IOC);  Surgeon: Sallee Lange, MD;  Location: [facility] ORS;  Service: Ophthalmology;  Laterality: Right;  [place] 01:28 AP% 23.4 CDE 36.14 fluid pack lot # [id number]   CATARACT EXTRACTION W/PHACO Left 07/28/2015   Procedure: CATARACT EXTRACTION PHACO AND INTRAOCULAR LENS PLACEMENT (IOC);  Surgeon: Sallee Lange, MD;  Location: [facility] ORS;  Service: Ophthalmology;  Laterality: Left;  [place]    1:29.7 AP%  24.9 CDE   41.32 fluid casette lot #[id number] H exp  09/23/2016   COONOSCOPY     AND ENDOSCOPY   DILATION AND CURETTAGE OF UTERUS     ENDARTERECTOMY Left 11/13/2020   Procedure: Evacuation of left neck hematoma;  Surgeon: Renford Dills, MD;  Location: [facility] ORS;  Service: Vascular;  Laterality: Left;   ENDARTERECTOMY Left 11/13/2020   Procedure: ENDARTERECTOMY CAROTID;  Surgeon: Annice Needy, MD;  Location: [facility] ORS;  Service: Vascular;  Laterality: Left;   TONSILLECTOMY     TUBAL LIGATION      There were no vitals filed for this visit.   Subjective Assessment - 01/12/21 1609     Subjective Pt doing well today, no updates since prior PT session. Pt arrives directly from OPOT.    Pertinent History 87 y.o. female with medical history significant for type 2 diabetes mellitus, essential hypertension, acquired hypothyroidism, hyperlipidemia, stage III chronic kidney disease reports increased right sided weakness on 10/19/20. She was diagnosed with left basal ganglia ischemic stroke. She was discharged to inpatient rehab for 10 days and then discharged home. Patient was evaluated by outpatient PT on 11/12/20. CVA was due to carotid stenosis. She underwent left carotid endartectomy on 7/21. Procedure went well however patient suffered from left cervical hematoma and had subsequent surgical procedure to stop the bleed and remove the hematoma on 11/13/20. They were concerned with her  breathing and therefore intubated her for 1 day, she was in ICU for 2 days and transferred to med/surge on 11/15/20. Patient was discharged home on 11/17/20 and is now returning to outpatient PT. She has had the stiches removed and is healing well. Denies any neck pain or stiffness. She lives with her daughter and has 24/7 caregiver support. She is still using RW for most ambulation. She is able to transfer to bedside commode well and is able to stand short time unsupported. She reports feeling very weak and fatigued. She reports she has been dragging her right foot more. She denies  any numbness/tingling; She reports feeling like her legs are wanting to do more but is hesitant because of fear of falling. She is mod I for self care morning routine. She does still receive help with showering. She has been working on increasing her activity but denies any formal exercise.    Currently in Pain? No/denies              Treatment provided this session -MINguard and set up for SPT from transport chair to/from [employer], level 1, 4 minutes, seat 8, arms at 10  -STS from chair +airex 1x10  -seated RLE Hip/knee flexion 1x15 (cues for full ROM throughout)  *seated recovery  -STS from chair +airex 1x10  -seated RLE Hip/knee flexion 1x15 (cues for full ROM throughout)  *seated recovery  -seated redTB clam 1x10 -seated RLE LAQ 1x10 c 4lb AW  -seated Rt ankle PF in CKC, 4lb AW 1x15  *seated recovery  -seated redTB clam 1x10 -seated RLE LAQ 1x15 c 5lb AW  -seated Rt ankle PF in CKC, 5lb AW 1x15   -AMB 15ft c RW, minguard assist (RLE ataxia mild)  -seated Rt hip cable resisted extension 1x20 @7 .5lb, 1x15 @ 9.5lb (still doesn't look terribly difficult)  -AMB gait training in hall 180ft c RW (loss of RLE control after 42ft, increased scuff)        PT Education - 01/12/21 1610     Education Details exercise tehcnique and safety    Person(s) Educated Patient    Methods Explanation;Demonstration    Comprehension Verbalized understanding;Returned demonstration              PT Short Term Goals - 12/03/20 0827       PT SHORT TERM GOAL #1   Title Patient will be adherent to HEP at least 3x a week to improve functional strength and balance for better safety at home.    Time 4    Period Weeks    Status New    Target Date 12/31/20      PT SHORT TERM GOAL #2   Title Patient will be independent in transferring sit<>Stand without pushing on arm rests to improve ability to get up from chair.    Time 4    Period Weeks    Status New    Target Date 12/31/20                PT Long Term Goals - 12/03/20 0828       PT LONG TERM GOAL #1   Title Patient (> 62 years old) will complete five times sit to stand test in < 15 seconds indicating an increased LE strength and improved balance.    Time 8    Period Weeks    Status New    Target Date 01/28/21      PT LONG TERM GOAL #2   Title Patient  will increase six minute walk test distance to >400 feet for improve gait ability    Time 8    Period Weeks    Status New    Target Date 01/28/21      PT LONG TERM GOAL #3   Title Patient will increase 10 meter walk test to >1.54m/s as to improve gait speed for better community ambulation and to reduce fall risk.    Time 8    Period Weeks    Status New    Target Date 01/28/21      PT LONG TERM GOAL #4   Title Patient will be independent with ascend/descend 6 steps using single UE in step over step pattern without LOB.    Time 8    Period Weeks    Status New    Target Date 01/28/21      PT LONG TERM GOAL #5   Title Patient will demonstrate an improved Berg Balance Score of >45/56 as to demonstrate improved balance with ADLs such as sitting/standing and transfer balance and reduced fall risk.    Time 8    Period Weeks    Status New    Target Date 01/28/21      PT LONG TERM GOAL #6   Title Patient will improve FOTO score to >60% to indicate improved functional mobility with ADLs.    Time 8    Period Weeks    Status New    Target Date 01/28/21                   Plan - 01/12/21 1612     Clinical Impression Statement Continued with current plan of care as laid out in evaluation and recent prior sessions. Pt remains motivated to advance progress toward goals. Rest breaks provided as needed, mostly due to loss of motor control from fatigue, as patient does not identify this as fatigue. Pt requires varying levels of assistance and cuing for completion of exercises for correct form and sometimes due to pain/weakness. Pt closely monitored  throughout session for safe vitals response and to maximize patient safety during interventions. Improved tolerance on Nustep today at level 1. Pt requires minGuard assist for safety with all transfers and AMB.    Personal Factors and Comorbidities Age;Comorbidity 3+;Fitness    Comorbidities HTN, CKD, fall risk, type 2 diabetes,    Examination-Activity Limitations Lift;Locomotion Level;Squat;Stairs;Stand;Toileting;Transfers;Bathing    Examination-Participation Restrictions Cleaning;Community Activity;Laundry;Meal Prep;Shop;Volunteer;Driving    Stability/Clinical Decision Making Stable/Uncomplicated    Clinical Decision Making Low    Rehab Potential Good    PT Frequency 2x / week    PT Duration 8 weeks    PT Treatment/Interventions Cryotherapy;Electrical Stimulation;Moist Heat;Gait training;Stair training;Functional mobility training;Therapeutic activities;Therapeutic exercise;Neuromuscular re-education;Balance training;Patient/family education;Orthotic Fit/Training;Energy conservation    PT Next Visit Plan work on LE strengthening and balance;    PT Home Exercise Plan Initiated, see patient instructions; no updates this session.             Patient will benefit from skilled therapeutic intervention in order to improve the following deficits and impairments:  Abnormal gait, Decreased balance, Decreased endurance, Decreased mobility, Difficulty walking, Cardiopulmonary status limiting activity, Decreased activity tolerance, Decreased coordination, Decreased strength  Visit Diagnosis: Other abnormalities of gait and mobility  Unsteadiness on feet  Muscle weakness (generalized)  Left basal ganglia embolic stroke [facility])  Other lack of coordination  Apraxia     Problem List Patient Active Problem List   Diagnosis Date Noted   Carotid stenosis,  symptomatic, with infarction (HCC) 11/13/2020   Gout flare 11/10/2020   UTI (urinary tract infection) 11/10/2020   Left carotid artery  stenosis 11/10/2020   Chronic kidney disease 11/10/2020   Left basal ganglia embolic stroke (HCC) 10/24/2020   PAC (premature atrial contraction)    Pre-procedural cardiovascular examination    Ischemic stroke (HCC) 10/20/2020   TIA (transient ischemic attack) 10/19/2020   Right hemiparesis (HCC) 10/19/2020   Type 2 diabetes mellitus with stage 3 chronic kidney disease, without long-term current use of insulin (HCC) 08/24/2018   Hyperlipidemia, unspecified 07/19/2017   Hypertension 07/19/2017   PUD (peptic ulcer disease) 07/19/2017   Type 2 diabetes mellitus (HCC) 07/19/2017   Personal history of gout 08/12/2016   Anemia, unspecified 04/09/2016   Hypothyroidism, acquired 12/10/2015   5:54 PM, 01/12/21 Rosamaria Lints, PT, DPT Physical Therapist - [facility]  Outpatient Physical Therapy- Main Campus [phone]     [place], PT 01/12/2021, 4:18 PM  [place] [facility] MAIN [facility] SERVICES [address] [place], [place], [address] Phone: [phone]   Fax:  [phone]  Name: Cassandra Schaefer MRN: [id number] Date of Birth: 10-Jul-1928

## 2021-01-12 NOTE — Therapy (Signed)
[place] [facility] MAIN [facility] SERVICES [address] [place], [place], [address] Phone: [phone]   Fax:  [phone]  Occupational Therapy Treatment  Patient Details  Name: Cassandra Schaefer MRN: [id number] Date of Birth: 10-07-1928 Referring Provider (OT): Dr. Barbette Reichmann   Encounter Date: 01/12/2021   OT End of Session - 01/12/21 1536     Visit Number 12    Number of Visits 24    Date for OT Re-Evaluation 02/01/21    Authorization Time Period Reporting period starting 11/10/2020    OT Start Time 1533    OT Stop Time 1600    OT Time Calculation (min) 27 min    Activity Tolerance Patient tolerated treatment well    Behavior During Therapy WFL for tasks assessed/performed             Past Medical History:  Diagnosis Date   Anemia    Cough    RESOLVING, NO FEVER   Diabetes mellitus without complication (HCC)    Gout    Hypertension    Hypothyroidism    PUD (peptic ulcer disease)    Stroke [facility])    Wears dentures    full upper, partial lower    Past Surgical History:  Procedure Laterality Date   AMPUTATION TOE Left 12/14/2017   Procedure: AMPUTATION TOE-4TH MPJ;  Surgeon: Gwyneth Revels, DPM;  Location: [facility];  Service: Podiatry;  Laterality: Left;  IVA LOCAL Diabetic - oral meds   APPENDECTOMY     CATARACT EXTRACTION W/PHACO Right 06/23/2015   Procedure: CATARACT EXTRACTION PHACO AND INTRAOCULAR LENS PLACEMENT (IOC);  Surgeon: Sallee Lange, MD;  Location: [facility] ORS;  Service: Ophthalmology;  Laterality: Right;  [place] 01:28 AP% 23.4 CDE 36.14 fluid pack lot # [id number]   CATARACT EXTRACTION W/PHACO Left 07/28/2015   Procedure: CATARACT EXTRACTION PHACO AND INTRAOCULAR LENS PLACEMENT (IOC);  Surgeon: Sallee Lange, MD;  Location: [facility] ORS;  Service: Ophthalmology;  Laterality: Left;  [place]    1:29.7 AP%  24.9 CDE   41.32 fluid casette lot #[id number] H exp 09/23/2016   COONOSCOPY     AND ENDOSCOPY   DILATION AND  CURETTAGE OF UTERUS     ENDARTERECTOMY Left 11/13/2020   Procedure: Evacuation of left neck hematoma;  Surgeon: Renford Dills, MD;  Location: [facility] ORS;  Service: Vascular;  Laterality: Left;   ENDARTERECTOMY Left 11/13/2020   Procedure: ENDARTERECTOMY CAROTID;  Surgeon: Annice Needy, MD;  Location: [facility] ORS;  Service: Vascular;  Laterality: Left;   TONSILLECTOMY     TUBAL LIGATION      There were no vitals filed for this visit.   Subjective Assessment - 01/12/21 1535     Subjective  "I cut myself a little bit when I was making a salad."    Patient is accompanied by: Family member    Pertinent History R ankle pain, gout, T2DM, HTN, CKDIII; R/L carotid endarectomy    Currently in Pain? No/denies            OT TREATMENT     Neuro muscular re-education:   Pt. worked on right hand [facility] skills grasping small 1" thin sticks with tweezers, Pt. worked on moving them from a horizontal flat position to a vertical position in preparation for fitting in the holes on the pegboard. Pt. worked on sliding the 1" thin pegs through a smooth flat dish with her 2nd digit to her thumb in preparation for placing them vertically onto the pegboard. Pt. Worked on  removing the vertical pegs from the pegboard while alternating thumb opposition to the tip of the 2nd through 5th digits.   Pt. was late for the session today. Pt. presented with difficulty grasping the flat pegs from the smooth surface of the dish, and flat board using the tweezers. Pt. Was able to grasp the pegs easier with the tweezers from a nonskid surface with the [employer]. Pt. Required cues to decrease pace for more accuracy when sliding the pegs off of the flat surface of the board, and dish with her 2nd digit, and thumb. Pt. continues to work on improving [employer] skills in order to work towards improving, and maximizing independence with ADLs, and IADL tasks.                                  OT Education - 01/12/21 1536      Education Details [facility]    Person(s) Educated Patient    Methods Explanation;Verbal cues    Comprehension Verbalized understanding              OT Short Term Goals - 01/05/21 1351       OT SHORT TERM GOAL #1   Title Pt will perform HEP for RUE strength/coordination independently.    Baseline Eval: HEP not yet established in outpatient, but pt is working with putty from [name]; 12/04/2020; [facility] HEP initiated this day with mod vc after return demo; 01/05/2021: indep with currently program, but ongoing as pt progresses    Time 6    Period Weeks    Status On-going    Target Date 01/22/21               OT Long Term Goals - 01/05/21 1352       OT LONG TERM GOAL #1   Title Pt will improve hand writing to 100% legibility with R hand to be able to independently write a check.    Baseline Eval: signature is 75% legible with R hand, requiring extra time; 12/04/2020: no change from eval; 01/05/2021: Printing is 100% legible, signature is 90% legible, but still requires extra time and effort.    Time 12    Period Weeks    Status On-going    Target Date 02/01/21      OT LONG TERM GOAL #2   Title Pt will improve R hand coordination to enable good manipulation of iphone using R dominant hand    Baseline Eval: Pt uses L hand d/t R hand lacking sufficient FMC to press buttons on phone; 12/04/2020: no change from eval; 01/05/2021: R hand coordination is improving but pt still uses L hand to dial phone.    Time 12    Period Weeks    Status On-going    Target Date 02/01/21      OT LONG TERM GOAL #3   Title Pt will improve GMC throughout RUE to enable pt to reach for and pick up ADL supplies with R dominant hand without dropping or knocking over objects.    Baseline Eval: Pt reports RUE is clumsy and easily knocks over objects when trying to reach for ADL supplies.  Pt verbalizes that her "aim is off."; 12/04/2020: pt reports slight improvement since eval and has started using her R hand to eat,  but still feels clumsy; 01/05/2021: Greatly improved; pt is using silverware to eat with R hand, can comb hair with RUE, still mildly ataxic  Time 12    Period Weeks    Status On-going    Target Date 02/01/21      OT LONG TERM GOAL #4   Title Pt will improve FOTO score to 68 or better to indicate measurable functional improvement.    Baseline Eval: FOTO 55; 01/05/2021: FOTO 61    Time 12    Period Weeks    Status On-going    Target Date 02/01/21      OT LONG TERM GOAL #5   Title Pt will perform dynamic standing tasks with modified indep, AD as needed in order to reduce fall risk with ADLs    Baseline Eval: CGA-min A for all dynamic standing tasks using RW; 12/04/2020: pt is using RW in home with distant supv, but family still provides close supv on stairs; 01/05/2021: Pt now using RW to amb in and around the house, can perform ADLs with RW and modified indep, and can manage stairs at home with modified indep.  No falls reported.    Time 12    Period Weeks    Status Achieved    Target Date 02/01/21                   Plan - 01/12/21 1541     Clinical Impression Statement Pt. was late for the session today. Pt. presented with difficulty grasping the flat pegs from the smooth surface of the dish, and flat board using the tweezers. Pt. Was able to grasp the pegs easier with the tweezers from a nonskid surface with the [employer]. Pt. Required cues to decrease pace for more accuracy when sliding the pegs off of the flat surface of the board, and dish with her 2nd digit, and thumb. Pt. continues to work on improving [facility] skills in order to work towards improving, and maximizing independence with ADLs, and IADL tasks.   OT Occupational Profile and History Detailed Assessment- Review of Records and additional review of physical, cognitive, psychosocial history related to current functional performance    Occupational performance deficits (Please refer to evaluation for details):  ADL's;Leisure;IADL's    Body Structure / Function / Physical Skills ADL;Coordination;Endurance;GMC;UE functional use;Balance;Sensation;Body mechanics;IADL;Pain;Dexterity;FMC;Strength;Gait;Mobility    Rehab Potential Good    Clinical Decision Making Several treatment options, min-mod task modification necessary    Comorbidities Affecting Occupational Performance: May have comorbidities impacting occupational performance    Modification or Assistance to Complete Evaluation  Min-Moderate modification of tasks or assist with assess necessary to complete eval    OT Frequency 2x / week    OT Duration 12 weeks    OT Treatment/Interventions Self-care/ADL training;Therapeutic exercise;DME and/or AE instruction;Functional Mobility Training;Balance training;Neuromuscular education;Therapeutic activities;Patient/family education    Consulted and Agree with Plan of Care Patient             Patient will benefit from skilled therapeutic intervention in order to improve the following deficits and impairments:   Body Structure / Function / Physical Skills: ADL, Coordination, Endurance, GMC, UE functional use, Balance, Sensation, Body mechanics, IADL, Pain, Dexterity, FMC, Strength, Gait, Mobility       Visit Diagnosis: Muscle weakness (generalized)  Other lack of coordination    Problem List Patient Active Problem List   Diagnosis Date Noted   Carotid stenosis, symptomatic, with infarction (HCC) 11/13/2020   Gout flare 11/10/2020   UTI (urinary tract infection) 11/10/2020   Left carotid artery stenosis 11/10/2020   Chronic kidney disease 11/10/2020   Left basal ganglia embolic stroke (HCC) 10/24/2020  PAC (premature atrial contraction)    Pre-procedural cardiovascular examination    Ischemic stroke (HCC) 10/20/2020   TIA (transient ischemic attack) 10/19/2020   Right hemiparesis (HCC) 10/19/2020   Type 2 diabetes mellitus with stage 3 chronic kidney disease, without long-term current use  of insulin (HCC) 08/24/2018   Hyperlipidemia, unspecified 07/19/2017   Hypertension 07/19/2017   PUD (peptic ulcer disease) 07/19/2017   Type 2 diabetes mellitus (HCC) 07/19/2017   Personal history of gout 08/12/2016   Anemia, unspecified 04/09/2016   Hypothyroidism, acquired 12/10/2015    Olegario Messier, MS, OTR/L 01/12/2021, 3:53 PM  [place] [facility] MAIN [facility] SERVICES [address] [place], [place], [address] Phone: [phone]   Fax:  [phone]  Name: Cassandra Schaefer MRN: [id number] Date of Birth: March 23, 1929

## 2021-01-13 ENCOUNTER — Encounter: Payer: Medicare HMO | Admitting: Occupational Therapy

## 2021-01-13 ENCOUNTER — Ambulatory Visit: Payer: Medicare HMO | Admitting: Physical Therapy

## 2021-01-14 ENCOUNTER — Encounter: Payer: Medicare HMO | Admitting: Speech Pathology

## 2021-01-14 ENCOUNTER — Other Ambulatory Visit: Payer: Self-pay

## 2021-01-14 ENCOUNTER — Encounter: Payer: Self-pay | Admitting: Occupational Therapy

## 2021-01-14 ENCOUNTER — Ambulatory Visit: Payer: Medicare HMO | Admitting: Occupational Therapy

## 2021-01-14 ENCOUNTER — Ambulatory Visit: Payer: Medicare HMO

## 2021-01-14 DIAGNOSIS — M6281 Muscle weakness (generalized): Secondary | ICD-10-CM

## 2021-01-14 DIAGNOSIS — R4701 Aphasia: Secondary | ICD-10-CM | POA: Diagnosis not present

## 2021-01-14 DIAGNOSIS — R2681 Unsteadiness on feet: Secondary | ICD-10-CM

## 2021-01-14 DIAGNOSIS — R2689 Other abnormalities of gait and mobility: Secondary | ICD-10-CM

## 2021-01-14 NOTE — Therapy (Signed)
[place] [facility] MAIN [facility] SERVICES [address] [place], [place], [address] Phone: [phone]   Fax:  [phone]  Occupational Therapy Treatment  Patient Details  Name: Cassandra Schaefer MRN: [id number] Date of Birth: 08/02/1928 Referring Provider (OT): Dr. Barbette Reichmann   Encounter Date: 01/14/2021   OT End of Session - 01/14/21 1350     Visit Number 13    Number of Visits 24    Date for OT Re-Evaluation 02/01/21    Authorization Time Period Reporting period starting 11/10/2020    OT Start Time 1350    OT Stop Time 1430    OT Time Calculation (min) 40 min    Activity Tolerance Patient tolerated treatment well    Behavior During Therapy WFL for tasks assessed/performed             Past Medical History:  Diagnosis Date   Anemia    Cough    RESOLVING, NO FEVER   Diabetes mellitus without complication (HCC)    Gout    Hypertension    Hypothyroidism    PUD (peptic ulcer disease)    Stroke [facility])    Wears dentures    full upper, partial lower    Past Surgical History:  Procedure Laterality Date   AMPUTATION TOE Left 12/14/2017   Procedure: AMPUTATION TOE-4TH MPJ;  Surgeon: Gwyneth Revels, DPM;  Location[facility];  Service: Podiatry;  Laterality: Left;  IVA LOCAL Diabetic - oral meds   APPENDECTOMY     CATARACT EXTRACTION W/PHACO Right 06/23/2015   Procedure: CATARACT EXTRACTION PHACO AND INTRAOCULAR LENS PLACEMENT (IOC);  Surgeon: Sallee Lange, MD;  Location: [facility];  Service: Ophthalmology;  Laterality: Right;  [place] 01:28 AP% 23.4 CDE 36.14 fluid pack lot # [id number]   CATARACT EXTRACTION W/PHACO Left 07/28/2015   Procedure: CATARACT EXTRACTION PHACO AND INTRAOCULAR LENS PLACEMENT (IOC);  Surgeon: Sallee Lange, MD;  Location: [facility];  Service: Ophthalmology;  Laterality: Left;  [place]    1:29.7 AP%  24.9 CDE   41.32 fluid casette lot #[id number] H exp 09/23/2016   COONOSCOPY     AND ENDOSCOPY   DILATION AND  CURETTAGE OF UTERUS     ENDARTERECTOMY Left 11/13/2020   Procedure: Evacuation of left neck hematoma;  Surgeon: Renford Dills, MD;  Location: [facility];  Service: Vascular;  Laterality: Left;   ENDARTERECTOMY Left 11/13/2020   Procedure: ENDARTERECTOMY CAROTID;  Surgeon: Annice Needy, MD;  Location: [facility];  Service: Vascular;  Laterality: Left;   TONSILLECTOMY     TUBAL LIGATION      There were no vitals filed for this visit.   Subjective Assessment - 01/14/21 1349     Subjective  "I cut myself a little bit when I was making a salad."    Patient is accompanied by: Family member    Pertinent History R ankle pain, gout, T2DM, HTN, CKDIII; R/L carotid endarectomy    Patient Stated Goals "I want to improve my aim when I'm using my R arm."    Currently in Pain? No/denies            OT TREATMENT     Neuro muscular re-education:   Pt. performed [facility] tasks using the Grooved pegboard. Pt. worked on grasping the grooved pegs from a horizontal position, and moving the pegs to a vertical position in the hand to prepare for placing them in the grooved slot.  Pt. worked on using her right hand to grasp the pegs, turn  them in her hand, and place them onto the grooved pegboard. Pt. worked on translatory movements with the right hand, moving the grooved pegs through her hand from her palm to there 2nd digit, and thumb in preparation for placing them vertically into the grooved pegs. Pt. worked on removing the pegs while grasping, and storing the pegs in the palm of her right hand, and dropping them from her palm one at a time. Pt. worked on removing the pegs while alternating thumb opposition to the tip of the 2nd through 5th digits.   Pt. is making progress with RUE functioning. Pt. reports that she is now engaging her right hand during more daily tasks at home. Pt. continues to improve with hand strength, and [facility] skills. Pt. was able to grasp the grooved pegs, however presented with less  difficulty turning them in her hand in the proper direction. Pt. was able to turn each peg when working on each step of the task separately. Pt. had difficulty grasping, and storing more than 2 pegs in the palm of her hand, with the most difficulty moving them from her finger tips to er palm. Pt. continues to work on improving [facility] skills in order to work towards improving, and maximizing independence with ADLs, and IADL tasks.                               OT Education - 01/14/21 1350     Education Details [facility]    Person(s) Educated Patient    Methods Explanation;Verbal cues    Comprehension Verbalized understanding              OT Short Term Goals - 01/05/21 1351       OT SHORT TERM GOAL #1   Title Pt will perform HEP for RUE strength/coordination independently.    Baseline Eval: HEP not yet established in outpatient, but pt is working with putty from [name]; 12/04/2020; [facility] HEP initiated this day with mod vc after return demo; 01/05/2021: indep with currently program, but ongoing as pt progresses    Time 6    Period Weeks    Status On-going    Target Date 01/22/21               OT Long Term Goals - 01/05/21 1352       OT LONG TERM GOAL #1   Title Pt will improve hand writing to 100% legibility with R hand to be able to independently write a check.    Baseline Eval: signature is 75% legible with R hand, requiring extra time; 12/04/2020: no change from eval; 01/05/2021: Printing is 100% legible, signature is 90% legible, but still requires extra time and effort.    Time 12    Period Weeks    Status On-going    Target Date 02/01/21      OT LONG TERM GOAL #2   Title Pt will improve R hand coordination to enable good manipulation of iphone using R dominant hand    Baseline Eval: Pt uses L hand d/t R hand lacking sufficient FMC to press buttons on phone; 12/04/2020: no change from eval; 01/05/2021: R hand coordination is improving but pt still uses L hand to  dial phone.    Time 12    Period Weeks    Status On-going    Target Date 02/01/21      OT LONG TERM GOAL #3   Title Pt will  improve GMC throughout RUE to enable pt to reach for and pick up ADL supplies with R dominant hand without dropping or knocking over objects.    Baseline Eval: Pt reports RUE is clumsy and easily knocks over objects when trying to reach for ADL supplies.  Pt verbalizes that her "aim is off."; 12/04/2020: pt reports slight improvement since eval and has started using her R hand to eat, but still feels clumsy; 01/05/2021: Greatly improved; pt is using silverware to eat with R hand, can comb hair with RUE, still mildly ataxic    Time 12    Period Weeks    Status On-going    Target Date 02/01/21      OT LONG TERM GOAL #4   Title Pt will improve FOTO score to 68 or better to indicate measurable functional improvement.    Baseline Eval: FOTO 55; 01/05/2021: FOTO 61    Time 12    Period Weeks    Status On-going    Target Date 02/01/21      OT LONG TERM GOAL #5   Title Pt will perform dynamic standing tasks with modified indep, AD as needed in order to reduce fall risk with ADLs    Baseline Eval: CGA-min A for all dynamic standing tasks using RW; 12/04/2020: pt is using RW in home with distant supv, but family still provides close supv on stairs; 01/05/2021: Pt now using RW to amb in and around the house, can perform ADLs with RW and modified indep, and can manage stairs at home with modified indep.  No falls reported.    Time 12    Period Weeks    Status Achieved    Target Date 02/01/21                   Plan - 01/14/21 1351     Clinical Impression Statement Pt. is making progress with RUE functioning. Pt. reports that she is now engaging her right hand during more daily tasks at home. Pt. continues to improve with hand strength, and [facility] skills. Pt. was able to grasp the grooved pegs, however presented with less difficulty turning them in her hand in the proper  direction. Pt. was able to turn each peg when working on each step of the task separately. Pt. had difficulty grasping, and storing more than 2 pegs in the palm of her hand, with the most difficulty moving them from her finger tips to er palm. Pt. continues to work on improving [facility] skills in order to work towards improving, and maximizing independence with ADLs, and IADL tasks.       OT Occupational Profile and History Detailed Assessment- Review of Records and additional review of physical, cognitive, psychosocial history related to current functional performance    Occupational performance deficits (Please refer to evaluation for details): ADL's;Leisure;IADL's    Body Structure / Function / Physical Skills ADL;Coordination;Endurance;GMC;UE functional use;Balance;Sensation;Body mechanics;IADL;Pain;Dexterity;FMC;Strength;Gait;Mobility    Rehab Potential Good    Clinical Decision Making Several treatment options, min-mod task modification necessary    Comorbidities Affecting Occupational Performance: May have comorbidities impacting occupational performance    Modification or Assistance to Complete Evaluation  Min-Moderate modification of tasks or assist with assess necessary to complete eval    OT Frequency 2x / week    OT Duration 12 weeks    OT Treatment/Interventions Self-care/ADL training;Therapeutic exercise;DME and/or AE instruction;Functional Mobility Training;Balance training;Neuromuscular education;Therapeutic activities;Patient/family education    Consulted and Agree with Plan of Care Patient  Patient will benefit from skilled therapeutic intervention in order to improve the following deficits and impairments:   Body Structure / Function / Physical Skills: ADL, Coordination, Endurance, GMC, UE functional use, Balance, Sensation, Body mechanics, IADL, Pain, Dexterity, FMC, Strength, Gait, Mobility       Visit Diagnosis: Muscle weakness (generalized)    Problem  List Patient Active Problem List   Diagnosis Date Noted   Carotid stenosis, symptomatic, with infarction (HCC) 11/13/2020   Gout flare 11/10/2020   UTI (urinary tract infection) 11/10/2020   Left carotid artery stenosis 11/10/2020   Chronic kidney disease 11/10/2020   Left basal ganglia embolic stroke (HCC) 10/24/2020   PAC (premature atrial contraction)    Pre-procedural cardiovascular examination    Ischemic stroke (HCC) 10/20/2020   TIA (transient ischemic attack) 10/19/2020   Right hemiparesis (HCC) 10/19/2020   Type 2 diabetes mellitus with stage 3 chronic kidney disease, without long-term current use of insulin (HCC) 08/24/2018   Hyperlipidemia, unspecified 07/19/2017   Hypertension 07/19/2017   PUD (peptic ulcer disease) 07/19/2017   Type 2 diabetes mellitus (HCC) 07/19/2017   Personal history of gout 08/12/2016   Anemia, unspecified 04/09/2016   Hypothyroidism, acquired 12/10/2015    Olegario Messier, MS, OTR/L 01/14/2021, 1:58 PM  [place] [facility] MAIN [facility] SERVICES [address] [place], [place], [address] Phone: [phone]   Fax:  [phone]  Name: Shakari Qazi MRN: [id number] Date of Birth: [birth year]

## 2021-01-14 NOTE — Therapy (Signed)
[place] MAIN [facility] SERVICES [address] [place], [place], [address] Phone: [phone]   Fax:  [phone]  Physical Therapy Treatment/Physical Therapy Progress Note Visit 10  Dates of reporting period  12/02/2020   to   01/14/2021   Patient Details  Name: Cassandra Schaefer MRN: [id number] Date of Birth: [birth year] Referring Provider (PT): Dr. Dew/Dr. Ginette Pitman PCP   Encounter Date: 01/14/2021   PT End of Session - 01/15/21 1437     Visit Number 10    Number of Visits 17    Date for PT Re-Evaluation 01/28/21    Authorization Type [employer]    Authorization Time Period 12/02/20-01/28/21    PT Start Time 1303    PT Stop Time 1344    PT Time Calculation (min) 41 min    Equipment Utilized During Treatment Gait belt    Activity Tolerance Patient tolerated treatment well    Behavior During Therapy WFL for tasks assessed/performed             Past Medical History:  Diagnosis Date   Anemia    Cough    RESOLVING, NO FEVER   Diabetes mellitus without complication ([place])    Gout    Hypertension    Hypothyroidism    PUD (peptic ulcer disease)    Stroke [facility])    Wears dentures    full upper, partial lower    Past Surgical History:  Procedure Laterality Date   AMPUTATION TOE Left 12/14/2017   Procedure: AMPUTATION TOE-4TH MPJ;  Surgeon: Samara Deist, DPM;  Location: [place];  Service: Podiatry;  Laterality: Left;  IVA LOCAL Diabetic - oral meds   APPENDECTOMY     CATARACT EXTRACTION W/PHACO Right 06/23/2015   Procedure: CATARACT EXTRACTION PHACO AND INTRAOCULAR LENS PLACEMENT (IOC);  Surgeon: Estill Cotta, MD;  Location: [facility];  Service: Ophthalmology;  Laterality: Right;  [place] 01:28 AP% 23.4 CDE 36.14 fluid pack lot # [id number]   CATARACT EXTRACTION W/PHACO Left 07/28/2015   Procedure: CATARACT EXTRACTION PHACO AND INTRAOCULAR LENS PLACEMENT (IOC);  Surgeon: Estill Cotta, MD;  Location: [facility];  Service:  Ophthalmology;  Laterality: Left;  [place]    1:29.7 AP%  24.9 CDE   41.32 fluid casette lot #[id number] H exp 09/23/2016   COONOSCOPY     AND ENDOSCOPY   DILATION AND CURETTAGE OF UTERUS     ENDARTERECTOMY Left 11/13/2020   Procedure: Evacuation of left neck hematoma;  Surgeon: Katha Cabal, MD;  Location: [facility];  Service: Vascular;  Laterality: Left;   ENDARTERECTOMY Left 11/13/2020   Procedure: ENDARTERECTOMY CAROTID;  Surgeon: Algernon Huxley, MD;  Location: [facility];  Service: Vascular;  Laterality: Left;   TONSILLECTOMY     TUBAL LIGATION      There were no vitals filed for this visit.   Subjective Assessment - 01/15/21 1452     Subjective Patient reports no pain and no major changes since previous session.    Patient is accompained by: Family member   Daughter, [name]   Pertinent History 87 y.o. female with medical history significant for type 2 diabetes mellitus, essential hypertension, acquired hypothyroidism, hyperlipidemia, stage III chronic kidney disease reports increased right sided weakness on 10/19/20. She was diagnosed with left basal ganglia ischemic stroke. She was discharged to inpatient rehab for 10 days and then discharged home. Patient was evaluated by outpatient PT on 11/12/20. CVA was due to carotid stenosis. She underwent left carotid endartectomy on 7/21. Procedure went well  however patient suffered from left cervical hematoma and had subsequent surgical procedure to stop the bleed and remove the hematoma on 11/13/20. They were concerned with her breathing and therefore intubated her for 1 day, she was in ICU for 2 days and transferred to med/surge on 11/15/20. Patient was discharged home on 11/17/20 and is now returning to outpatient PT. She has had the stiches removed and is healing well. Denies any neck pain or stiffness. She lives with her daughter and has 24/7 caregiver support. She is still using RW for most ambulation. She is able to transfer to bedside commode well and is  able to stand short time unsupported. She reports feeling very weak and fatigued. She reports she has been dragging her right foot more. She denies any numbness/tingling; She reports feeling like her legs are wanting to do more but is hesitant because of fear of falling. She is mod I for self care morning routine. She does still receive help with showering. She has been working on increasing her activity but denies any formal exercise.    Limitations Standing;Walking    How long can you sit comfortably? NA    How long can you stand comfortably? 30-60 min with support    How long can you walk comfortably? about 100 feet with RW, still limited to short distances;    Diagnostic tests MRI shows left basal ganglia infarct 10/21/20    Patient Stated Goals "Improve balance and walking and not need RW, get back to independent."    Currently in Pain? No/denies    Pain Onset In the past 7 days              Interventions  Goals retested for progress note (see goal section for details).  FOTO: 71 - partially met  Sit to stand: Patient able to complete sit to stand without use of upper extremities to assist  5X STS: 29 seconds hands-free  10MWT: 0.5 m/s with RW, contact guard assist  6MWT: 368 ft with RW, with one rest break ending test 5 seconds early due to fatigue.  Testing followed by prolonged seated rest break.  Ascending/descending stairs: Patient uses PT stairs (4 steps) with bilateral upper extremity support on handrails for both ascending/descending.  Patient exhibits reciprocal pattern ascending and step to pattern descending.  BERG: Deferred due to time  PT provides education regarding progress made based on goal retesting, indications for physical therapy and plan of care, areas yet to be improved.  Patient verbalizes understanding.    Note: Portions of this document were prepared using Dragon voice recognition software and although reviewed may contain unintentional dictation  errors in syntax, grammar, or spelling.     PT Short Term Goals - 01/14/21 1306       PT SHORT TERM GOAL #1   Title Patient will be adherent to HEP at least 3x a week to improve functional strength and balance for better safety at home.    Baseline 9/21: Pt reports compliance with HEP and is confident    Time 4    Period Weeks    Status Achieved    Target Date 12/31/20      PT SHORT TERM GOAL #2   Title Patient will be independent in transferring sit<>Stand without pushing on arm rests to improve ability to get up from chair.    Baseline 9/21: pt able to complete STS without use of UEs    Time 4    Period Weeks  Status Achieved    Target Date 12/31/20               PT Long Term Goals - 01/14/21 1314       PT LONG TERM GOAL #1   Title Patient (> 76 years old) will complete five times sit to stand test in < 15 seconds indicating an increased LE strength and improved balance.    Baseline 9/21: 29 seconds hands-free    Time 8    Period Weeks    Status On-going    Target Date 01/28/21      PT LONG TERM GOAL #2   Title Patient will increase six minute walk test distance to >400 feet for improve gait ability    Baseline 9/21: 368 ft with RW    Time 8    Period Weeks    Status Partially Met    Target Date 01/28/21      PT LONG TERM GOAL #3   Title Patient will increase 10 meter walk test to >1.66ms as to improve gait speed for better community ambulation and to reduce fall risk.    Baseline 9/21: 0.5 m/s with RW    Time 8    Period Weeks    Status New    Target Date 01/28/21      PT LONG TERM GOAL #4   Title Patient will be independent with ascend/descend 6 steps using single UE in step over step pattern without LOB.    Baseline 9/21:  Patient uses PT stairs (4 steps) with bilateral upper extremity support on handrails for both ascending/descending.  Patient exhibits reciprocal pattern ascending and step to pattern descending.    Time 8    Period Weeks    Status  On-going    Target Date 01/28/21      PT LONG TERM GOAL #5   Title Patient will demonstrate an improved Berg Balance Score of >45/56 as to demonstrate improved balance with ADLs such as sitting/standing and transfer balance and reduced fall risk.    Baseline 9/21: deferred d/t time    Time 8    Period Weeks    Status New    Target Date 01/28/21      PT LONG TERM GOAL #6   Title Patient will improve FOTO score to >60% to indicate improved functional mobility with ADLs.    Baseline 9/21: 59    Time 8    Period Weeks    Status Partially Met    Target Date 01/28/21                   Plan - 01/15/21 1447     Clinical Impression Statement Goals reassessed for progress note.  Patient making gains in PT as exhibited by increase in Foto score (59) and improved ability to perform transfers.  Patient was able to perform sit to stand from standard chair hands-free.  Pt completed 5 times sit to stand hands-free in 29 seconds, this does indicate patient is at an increased risk for falls.  Patient gait speed was 0.5 m/s with a rolling walker.  Patient ambulated 368 feet in about 6 minutes but had to discontinue test slightly early due to fatigue.  Patient exhibits ability to ascend and descend clinic stairs but does require upper extremity support bilaterally.  Patient's condition has the potential to improve in response to therapy. Maximum improvement is yet to be obtained. The anticipated improvement is attainable and reasonable in a generally predictable time.  The patient will benefit from further skilled PT to continue to improve general mobility, gait ability, strength, and balance to decrease fall risk.    Personal Factors and Comorbidities Age;Comorbidity 3+;Fitness    Comorbidities HTN, CKD, fall risk, type 2 diabetes,    Examination-Activity Limitations Lift;Locomotion Level;Squat;Stairs;Stand;Toileting;Transfers;Bathing    Examination-Participation Restrictions Cleaning;Community  Activity;Laundry;Meal Prep;Shop;Volunteer;Driving    Stability/Clinical Decision Making Stable/Uncomplicated    Rehab Potential Good    PT Frequency 2x / week    PT Duration 8 weeks    PT Treatment/Interventions Cryotherapy;Electrical Stimulation;Moist Heat;Gait training;Stair training;Functional mobility training;Therapeutic activities;Therapeutic exercise;Neuromuscular re-education;Balance training;Patient/family education;Orthotic Fit/Training;Energy conservation    PT Next Visit Plan work on LE strengthening and balance; gait training, endurance    PT Home Exercise Plan Initiated, see patient instructions; no updates this session.             Patient will benefit from skilled therapeutic intervention in order to improve the following deficits and impairments:  Abnormal gait, Decreased balance, Decreased endurance, Decreased mobility, Difficulty walking, Cardiopulmonary status limiting activity, Decreased activity tolerance, Decreased coordination, Decreased strength  Visit Diagnosis: Muscle weakness (generalized)  Other abnormalities of gait and mobility  Unsteadiness on feet     Problem List Patient Active Problem List   Diagnosis Date Noted   Carotid stenosis, symptomatic, with infarction ([place]) 11/13/2020   Gout flare 11/10/2020   UTI (urinary tract infection) 11/10/2020   Left carotid artery stenosis 11/10/2020   Chronic kidney disease 11/10/2020   Left basal ganglia embolic stroke ([place]) 02/77/4128   PAC (premature atrial contraction)    Pre-procedural cardiovascular examination    Ischemic stroke ([place]) 10/20/2020   TIA (transient ischemic attack) 10/19/2020   Right hemiparesis ([place]) 10/19/2020   Type 2 diabetes mellitus with stage 3 chronic kidney disease, without long-term current use of insulin ([place]) 08/24/2018   Hyperlipidemia, unspecified 07/19/2017   Hypertension 07/19/2017   PUD (peptic ulcer disease) 07/19/2017   Type 2 diabetes mellitus ([place]) 07/19/2017    Personal history of gout 08/12/2016   Anemia, unspecified 04/09/2016   Hypothyroidism, acquired 12/10/2015    Zollie Pee, PT 01/15/2021, 2:52 PM  [place] MAIN [facility] SERVICES [address] [place], [place], [address] Phone: [phone]   Fax:  [phone]  Name: Cassandra Schaefer MRN: [id number] Date of Birth: Feb 04, 1929

## 2021-01-15 ENCOUNTER — Encounter: Payer: Medicare HMO | Admitting: Occupational Therapy

## 2021-01-15 ENCOUNTER — Ambulatory Visit: Payer: Medicare HMO | Admitting: Physical Therapy

## 2021-01-19 ENCOUNTER — Encounter: Payer: Medicare HMO | Admitting: Speech Pathology

## 2021-01-20 ENCOUNTER — Encounter: Payer: Medicare HMO | Admitting: Speech Pathology

## 2021-01-20 ENCOUNTER — Ambulatory Visit: Payer: Medicare HMO

## 2021-01-20 ENCOUNTER — Encounter: Payer: Medicare HMO | Admitting: Occupational Therapy

## 2021-01-20 ENCOUNTER — Ambulatory Visit: Payer: Medicare HMO | Admitting: Physical Therapy

## 2021-01-20 ENCOUNTER — Ambulatory Visit: Payer: Medicare HMO | Admitting: Occupational Therapy

## 2021-01-21 ENCOUNTER — Other Ambulatory Visit: Payer: Self-pay | Admitting: Internal Medicine

## 2021-01-21 DIAGNOSIS — Z1231 Encounter for screening mammogram for malignant neoplasm of breast: Secondary | ICD-10-CM

## 2021-01-22 ENCOUNTER — Other Ambulatory Visit: Payer: Self-pay

## 2021-01-22 ENCOUNTER — Encounter: Payer: Medicare HMO | Admitting: Occupational Therapy

## 2021-01-22 ENCOUNTER — Encounter: Payer: Self-pay | Admitting: Occupational Therapy

## 2021-01-22 ENCOUNTER — Encounter: Payer: Medicare HMO | Admitting: Speech Pathology

## 2021-01-22 ENCOUNTER — Ambulatory Visit: Payer: Medicare HMO

## 2021-01-22 ENCOUNTER — Ambulatory Visit: Payer: Medicare HMO | Admitting: Physical Therapy

## 2021-01-22 ENCOUNTER — Ambulatory Visit: Payer: Medicare HMO | Admitting: Occupational Therapy

## 2021-01-22 DIAGNOSIS — R2689 Other abnormalities of gait and mobility: Secondary | ICD-10-CM

## 2021-01-22 DIAGNOSIS — R4701 Aphasia: Secondary | ICD-10-CM | POA: Diagnosis not present

## 2021-01-22 DIAGNOSIS — R278 Other lack of coordination: Secondary | ICD-10-CM

## 2021-01-22 DIAGNOSIS — M6281 Muscle weakness (generalized): Secondary | ICD-10-CM

## 2021-01-22 DIAGNOSIS — R2681 Unsteadiness on feet: Secondary | ICD-10-CM

## 2021-01-22 NOTE — Therapy (Signed)
[place] [facility] SERVICES [place], [place], [address] Phone: [phone]   Fax:  [phone]  Physical Therapy Treatment  Patient Details  Name: Cassandra Schaefer MRN: [id number] Date of Birth: 03/04/1929 Referring Provider (PT): Dr. Dew/Dr. Ginette Pitman PCP   Encounter Date: 01/22/2021   PT End of Session - 01/22/21 1615     Visit Number 11    Number of Visits 17    Date for PT Re-Evaluation 01/28/21    Authorization Type [employer]    Authorization Time Period 12/02/20-01/28/21    PT Start Time 1518    PT Stop Time 1601    PT Time Calculation (min) 43 min    Equipment Utilized During Treatment Gait belt    Activity Tolerance Patient tolerated treatment well    Behavior During Therapy WFL for tasks assessed/performed             Past Medical History:  Diagnosis Date   Anemia    Cough    RESOLVING, NO FEVER   Diabetes mellitus without complication ([place])    Gout    Hypertension    Hypothyroidism    PUD (peptic ulcer disease)    Stroke [facility])    Wears dentures    full upper, partial lower    Past Surgical History:  Procedure Laterality Date   AMPUTATION TOE Left 12/14/2017   Procedure: AMPUTATION TOE-4TH MPJ;  Surgeon: Samara Deist, DPM;  Location: [facility];  Service: Podiatry;  Laterality: Left;  IVA LOCAL Diabetic - oral meds   APPENDECTOMY     CATARACT EXTRACTION W/PHACO Right 06/23/2015   Procedure: CATARACT EXTRACTION PHACO AND INTRAOCULAR LENS PLACEMENT (IOC);  Surgeon: Estill Cotta, MD;  Location: [facility];  Service: Ophthalmology;  Laterality: Right;  [place] 01:28 AP% 23.4 CDE 36.14 fluid pack lot # [id number]   CATARACT EXTRACTION W/PHACO Left 07/28/2015   Procedure: CATARACT EXTRACTION PHACO AND INTRAOCULAR LENS PLACEMENT (IOC);  Surgeon: Estill Cotta, MD;  Location: [facility];  Service: Ophthalmology;  Laterality: Left;  [place]    1:29.7 AP%  24.9 CDE   41.32 fluid casette lot #[id number] H exp  09/23/2016   COONOSCOPY     AND ENDOSCOPY   DILATION AND CURETTAGE OF UTERUS     ENDARTERECTOMY Left 11/13/2020   Procedure: Evacuation of left neck hematoma;  Surgeon: Katha Cabal, MD;  Location: [facility];  Service: Vascular;  Laterality: Left;   ENDARTERECTOMY Left 11/13/2020   Procedure: ENDARTERECTOMY CAROTID;  Surgeon: Algernon Huxley, MD;  Location: [facility];  Service: Vascular;  Laterality: Left;   TONSILLECTOMY     TUBAL LIGATION      There were no vitals filed for this visit.   Subjective Assessment - 01/22/21 1518     Subjective Pt reports she missed previous appointment d/t gout.  She reports it has improved and has no pain currently.    Patient is accompained by: Family member   Daughter, Cassandra Schaefer   Pertinent History 87 y.o. female with medical history significant for type 2 diabetes mellitus, essential hypertension, acquired hypothyroidism, hyperlipidemia, stage III chronic kidney disease reports increased right sided weakness on 10/19/20. She was diagnosed with left basal ganglia ischemic stroke. She was discharged to inpatient rehab for 10 days and then discharged home. Patient was evaluated by outpatient PT on 11/12/20. CVA was due to carotid stenosis. She underwent left carotid endartectomy on 7/21. Procedure went well however patient suffered from left cervical hematoma and had subsequent surgical  procedure to stop the bleed and remove the hematoma on 11/13/20. They were concerned with her breathing and therefore intubated her for 1 day, she was in ICU for 2 days and transferred to med/surge on 11/15/20. Patient was discharged home on 11/17/20 and is now returning to outpatient PT. She has had the stiches removed and is healing well. Denies any neck pain or stiffness. She lives with her daughter and has 24/7 caregiver support. She is still using RW for most ambulation. She is able to transfer to bedside commode well and is able to stand short time unsupported. She reports feeling very  weak and fatigued. She reports she has been dragging her right foot more. She denies any numbness/tingling; She reports feeling like her legs are wanting to do more but is hesitant because of fear of falling. She is mod I for self care morning routine. She does still receive help with showering. She has been working on increasing her activity but denies any formal exercise.    Limitations Standing;Walking    How long can you sit comfortably? NA    How long can you stand comfortably? 30-60 min with support    How long can you walk comfortably? about 100 feet with RW, still limited to short distances;    Diagnostic tests MRI shows left basal ganglia infarct 10/21/20    Patient Stated Goals "Improve balance and walking and not need RW, get back to independent."    Currently in Pain? No/denies    Pain Onset In the past 7 days             [facility] PT Assessment - 01/22/21 0001       Berg Balance Test   Sit to Stand Able to stand  independently using hands    Standing Unsupported Able to stand safely 2 minutes    Sitting with Back Unsupported but Feet Supported on Floor or Stool Able to sit safely and securely 2 minutes    Stand to Sit Sits safely with minimal use of hands    Transfers Able to transfer safely, definite need of hands    Standing Unsupported with Eyes Closed Able to stand 10 seconds safely    Standing Unsupported with Feet Together Able to place feet together independently and stand 1 minute safely    From Standing, Reach Forward with Outstretched Arm Can reach confidently >25 cm (10")    From Standing Position, Pick up Object from Floor Able to pick up shoe safely and easily    From Standing Position, Turn to Look Behind Over each Shoulder Looks behind from both sides and weight shifts well    Turn 360 Degrees Able to turn 360 degrees safely but slowly    Standing Unsupported, Alternately Place Feet on Step/Stool Able to complete >2 steps/needs minimal assist    Standing  Unsupported, One Foot in [employer] balance while stepping or standing    Standing on One Leg Tries to lift leg/unable to hold 3 seconds but remains standing independently    Total Score 42             Interventions  BERG: 42/56; indicates fall risk  STS with CGA 1x10 no resistance, 1x10 with YTB around knees to prevent knee valgus; pt rates exercise as hard  Seated hip abduction with YTB - 3x12  At support surface with contact-guard assist:  Standing alternating toe tapping on 6" step 1x15 unweighted, progressed to 2x15 with 2# ankle weights on BLEs  Semi-tandem  stance 2x30-45 sec each LE with intermittent UE support  2 cones placed in front of patient with patient instructed to tap cones with UUE support initially.  Patient attempts multiple repetitions, knocks over cones --Decreases to one cone for patient to tap each lower extremity with, increased reps on right lower extremity (affected side).  Had patient perform with 3 finger support on support surface with left upper extremity and holding on fully with RUE x  multiple reps.  PT instructs patient through HEP.  Instruct patient to perform sit to stands at support surface with someone with her.  Provides patient with yellow Thera-Band to perform seated hip abduction with.  Patient was able to return correct technique of exercises and verbalized understanding of frequency, sets and reps as well as safety precautions. Access Code: [email or web address] URL: [email or web address] Date: 01/22/2021 Prepared by: Ricard Dillon  Exercises Sit to Stand with Counter Support - 1 x daily - 4 x weekly - 2 sets - 12 reps Seated Hip Abduction with Resistance - 1 x daily - 4 x weekly - 3 sets - 12 reps    PT Education - 01/22/21 1615     Education Details Findings of goal testing, HEP, exercise technique, body mechanics    Person(s) Educated Patient    Methods Explanation;Demonstration;Tactile cues;Verbal cues;Handout    Comprehension  Verbalized understanding;Returned demonstration;Verbal cues required;Tactile cues required;Need further instruction              PT Short Term Goals - 01/22/21 1619       PT SHORT TERM GOAL #1   Title Patient will be adherent to HEP at least 3x a week to improve functional strength and balance for better safety at home.    Baseline 9/21: Pt reports compliance with HEP and is confident    Time 4    Period Weeks    Status Achieved    Target Date 12/31/20      PT SHORT TERM GOAL #2   Title Patient will be independent in transferring sit<>Stand without pushing on arm rests to improve ability to get up from chair.    Baseline 9/21: pt able to complete STS without use of UEs    Time 4    Period Weeks    Status Achieved    Target Date 12/31/20               PT Long Term Goals - 01/22/21 1619       PT LONG TERM GOAL #1   Title Patient (> 47 years old) will complete five times sit to stand test in < 15 seconds indicating an increased LE strength and improved balance.    Baseline 9/21: 29 seconds hands-free    Time 8    Period Weeks    Status On-going    Target Date 01/28/21      PT LONG TERM GOAL #2   Title Patient will increase six minute walk test distance to >400 feet for improve gait ability    Baseline 9/21: 368 ft with RW    Time 8    Period Weeks    Status Partially Met    Target Date 01/28/21      PT LONG TERM GOAL #3   Title Patient will increase 10 meter walk test to >1.29ms as to improve gait speed for better community ambulation and to reduce fall risk.    Baseline 9/21: 0.5 m/s with RW    Time 8    Period Weeks  Status On-going    Target Date 01/28/21      PT LONG TERM GOAL #4   Title Patient will be independent with ascend/descend 6 steps using single UE in step over step pattern without LOB.    Baseline 9/21:  Patient uses PT stairs (4 steps) with bilateral upper extremity support on handrails for both ascending/descending.  Patient exhibits  reciprocal pattern ascending and step to pattern descending.    Time 8    Period Weeks    Status On-going    Target Date 01/28/21      PT LONG TERM GOAL #5   Title Patient will demonstrate an improved Berg Balance Score of >45/56 as to demonstrate improved balance with ADLs such as sitting/standing and transfer balance and reduced fall risk.    Baseline 9/21: deferred d/t time; 9/29: 42/56    Time 8    Period Weeks    Status On-going    Target Date 01/28/21      PT LONG TERM GOAL #6   Title Patient will improve FOTO score to >60% to indicate improved functional mobility with ADLs.    Baseline 9/21: 59    Time 8    Period Weeks    Status Partially Met    Target Date 01/28/21                   Plan - 01/22/21 1615     Clinical Impression Statement Further goal reassessment completed on this date.  Patient Merrilee Jansky score was 42/56, indicating patient is at an increased risk for falls.  Patient had most difficulty with tandem stance and single-leg balance with test.  Other part of interventions focused on sit to stand training for technique and for improved lower extremity strength.  Patient with knee valgus throughout sit to stand and reports of fatigue.  Patient was able to improve technique with yellow Thera-Band placed above knees.  PT provided HEP with addition of sit to stand and seated hip abduction (see note for details).  The patient will benefit from further skilled PT to improve mobility, gait, strength, and balance to decrease fall risk and increase quality of life.    Personal Factors and Comorbidities Age;Comorbidity 3+;Fitness    Comorbidities HTN, CKD, fall risk, type 2 diabetes,    Examination-Activity Limitations Lift;Locomotion Level;Squat;Stairs;Stand;Toileting;Transfers;Bathing    Examination-Participation Restrictions Cleaning;Community Activity;Laundry;Meal Prep;Shop;Volunteer;Driving    Stability/Clinical Decision Making Stable/Uncomplicated    Rehab Potential  Good    PT Frequency 2x / week    PT Duration 8 weeks    PT Treatment/Interventions Cryotherapy;Electrical Stimulation;Moist Heat;Gait training;Stair training;Functional mobility training;Therapeutic activities;Therapeutic exercise;Neuromuscular re-education;Balance training;Patient/family education;Orthotic Fit/Training;Energy conservation    PT Next Visit Plan work on LE strengthening and balance; gait training, endurance    PT Home Exercise Plan Access Code: [email or web address]             Patient will benefit from skilled therapeutic intervention in order to improve the following deficits and impairments:  Abnormal gait, Decreased balance, Decreased endurance, Decreased mobility, Difficulty walking, Cardiopulmonary status limiting activity, Decreased activity tolerance, Decreased coordination, Decreased strength  Visit Diagnosis: Muscle weakness (generalized)  Unsteadiness on feet  Other abnormalities of gait and mobility     Problem List Patient Active Problem List   Diagnosis Date Noted   Carotid stenosis, symptomatic, with infarction ([place]) 11/13/2020   Gout flare 11/10/2020   UTI (urinary tract infection) 11/10/2020   Left carotid artery stenosis 11/10/2020   Chronic kidney disease 11/10/2020   Left  basal ganglia embolic stroke ([place]) 09/38/1829   PAC (premature atrial contraction)    Pre-procedural cardiovascular examination    Ischemic stroke ([place]) 10/20/2020   TIA (transient ischemic attack) 10/19/2020   Right hemiparesis ([place]) 10/19/2020   Type 2 diabetes mellitus with stage 3 chronic kidney disease, without long-term current use of insulin ([place]) 08/24/2018   Hyperlipidemia, unspecified 07/19/2017   Hypertension 07/19/2017   PUD (peptic ulcer disease) 07/19/2017   Type 2 diabetes mellitus ([place]) 07/19/2017   Personal history of gout 08/12/2016   Anemia, unspecified 04/09/2016   Hypothyroidism, acquired 12/10/2015    Zollie Pee, PT 01/22/2021, 4:21 PM  [facility] [address] [place], [place], [address] Phone: [phone]   Fax:  [phone]  Name: Meaghan Whistler MRN: [id number] Date of Birth: 1928-11-03

## 2021-01-22 NOTE — Therapy (Signed)
[place] [facility] MAIN [facility] SERVICES [address] [place], [place], [address] Phone: [phone]   Fax:  [phone]  Occupational Therapy Treatment  Patient Details  Name: Cassandra Schaefer MRN: [id number] Date of Birth: 1928/12/15 Referring Provider (OT): Dr. Barbette Reichmann   Encounter Date: 01/22/2021   OT End of Session - 01/22/21 1437     Visit Number 14    Number of Visits 24    Date for OT Re-Evaluation 02/01/21    Authorization Time Period Reporting period starting 11/10/2020    OT Start Time 1435    OT Stop Time 1515    OT Time Calculation (min) 40 min    Activity Tolerance Patient tolerated treatment well    Behavior During Therapy WFL for tasks assessed/performed             Past Medical History:  Diagnosis Date   Anemia    Cough    RESOLVING, NO FEVER   Diabetes mellitus without complication (HCC)    Gout    Hypertension    Hypothyroidism    PUD (peptic ulcer disease)    Stroke [facility])    Wears dentures    full upper, partial lower    Past Surgical History:  Procedure Laterality Date   AMPUTATION TOE Left 12/14/2017   Procedure: AMPUTATION TOE-4TH MPJ;  Surgeon: Gwyneth Revels, DPM;  Location: [facility];  Service: Podiatry;  Laterality: Left;  IVA LOCAL Diabetic - oral meds   APPENDECTOMY     CATARACT EXTRACTION W/PHACO Right 06/23/2015   Procedure: CATARACT EXTRACTION PHACO AND INTRAOCULAR LENS PLACEMENT (IOC);  Surgeon: Sallee Lange, MD;  Location: [facility];  Service: Ophthalmology;  Laterality: Right;  [place] 01:28 AP% 23.4 CDE 36.14 fluid pack lot # [id number]   CATARACT EXTRACTION W/PHACO Left 07/28/2015   Procedure: CATARACT EXTRACTION PHACO AND INTRAOCULAR LENS PLACEMENT (IOC);  Surgeon: Sallee Lange, MD;  Location: [facility];  Service: Ophthalmology;  Laterality: Left;  [place]    1:29.7 AP%  24.9 CDE   41.32 fluid casette lot #[id number] H exp 09/23/2016   COONOSCOPY     AND ENDOSCOPY   DILATION AND  CURETTAGE OF UTERUS     ENDARTERECTOMY Left 11/13/2020   Procedure: Evacuation of left neck hematoma;  Surgeon: Renford Dills, MD;  Location: [facility];  Service: Vascular;  Laterality: Left;   ENDARTERECTOMY Left 11/13/2020   Procedure: ENDARTERECTOMY CAROTID;  Surgeon: Annice Needy, MD;  Location: [facility];  Service: Vascular;  Laterality: Left;   TONSILLECTOMY     TUBAL LIGATION      There were no vitals filed for this visit.   Subjective Assessment - 01/22/21 1436     Subjective  Pt. reports having gout in the right ankle.    Patient is accompanied by: Family member    Pertinent History R ankle pain, gout, T2DM, HTN, CKDIII; R/L carotid endarectomy    Currently in Pain? No/denies            OT TREATMENT     Neuro muscular re-education:   Pt. worked on [facility] skills using the [employer] Task. Pt. worked on sustaining grasp on the resistive tweezers while grasping this sticks, and moving them from a horizontal position to a vertical position to prepare for placing them into the pegboard. Pt. required verbal cues, and cues for visual demonstration for wrist position, and hand pattern when placing them into the pegboard. The task was modified to using her fingers to grasp  the pegs from a horizontal position, and turn them to place them in the pegboard vertically. Pt. worked on using the tweezers to remove the pegs from a vertical position in the pegboard.    Therapeutic Exercise:   Pt. performed gross gripping with a gross grip strengthener. Pt. worked on sustaining grip while grasping pegs and reaching at various heights. The Gripper was set to 11.2 # of grip strength resistance. Pt. Worked on pinch strengthening in the left hand for lateral, and 3pt. pinch using yellow, red, green, and blue resistive clips. Pt. worked on placing the clips at various vertical and horizontal angles. Tactile and verbal cues were required for eliciting the desired movement.    Pt. is making  progress with RUE functioning. Pt. reports that she is now able to brush the back of her hair, and apply lotion with her right hand. Pt. reports that she is able to brush her teeth thoroughly with an electric toothbrush, and turn the toothbrush on, and off. Pt. Reports using her right hand to feed herself, and to cut her food. Pt. continues to present with difficulty with [facility] skills using the tweezers. Pt. required the task to be modified in order for her to use her fingers to grasp the pegs from a horizontal position, and turn them to place them in the pegboard vertically. Pt. was able to grasp the grooved pegs, however had difficulty turning them in her hand in the proper direction. Pt. was able to turn each peg when working on each step of the task separately. Pt. continues to work on improving [facility] skills in order to work towards improving, and maximizing independence with ADLs, and IADL tasks.                            OT Education - 01/22/21 1437     Education Details [facility]    Person(s) Educated Patient    Methods Explanation;Verbal cues    Comprehension Verbalized understanding              OT Short Term Goals - 01/05/21 1351       OT SHORT TERM GOAL #1   Title Pt will perform HEP for RUE strength/coordination independently.    Baseline Eval: HEP not yet established in outpatient, but pt is working with putty from [name]; 12/04/2020; [facility] HEP initiated this day with mod vc after return demo; 01/05/2021: indep with currently program, but ongoing as pt progresses    Time 6    Period Weeks    Status On-going    Target Date 01/22/21               OT Long Term Goals - 01/05/21 1352       OT LONG TERM GOAL #1   Title Pt will improve hand writing to 100% legibility with R hand to be able to independently write a check.    Baseline Eval: signature is 75% legible with R hand, requiring extra time; 12/04/2020: no change from eval; 01/05/2021: Printing is 100% legible,  signature is 90% legible, but still requires extra time and effort.    Time 12    Period Weeks    Status On-going    Target Date 02/01/21      OT LONG TERM GOAL #2   Title Pt will improve R hand coordination to enable good manipulation of iphone using R dominant hand    Baseline Eval: Pt uses L hand  d/t R hand lacking sufficient FMC to press buttons on phone; 12/04/2020: no change from eval; 01/05/2021: R hand coordination is improving but pt still uses L hand to dial phone.    Time 12    Period Weeks    Status On-going    Target Date 02/01/21      OT LONG TERM GOAL #3   Title Pt will improve GMC throughout RUE to enable pt to reach for and pick up ADL supplies with R dominant hand without dropping or knocking over objects.    Baseline Eval: Pt reports RUE is clumsy and easily knocks over objects when trying to reach for ADL supplies.  Pt verbalizes that her "aim is off."; 12/04/2020: pt reports slight improvement since eval and has started using her R hand to eat, but still feels clumsy; 01/05/2021: Greatly improved; pt is using silverware to eat with R hand, can comb hair with RUE, still mildly ataxic    Time 12    Period Weeks    Status On-going    Target Date 02/01/21      OT LONG TERM GOAL #4   Title Pt will improve FOTO score to 68 or better to indicate measurable functional improvement.    Baseline Eval: FOTO 55; 01/05/2021: FOTO 61    Time 12    Period Weeks    Status On-going    Target Date 02/01/21      OT LONG TERM GOAL #5   Title Pt will perform dynamic standing tasks with modified indep, AD as needed in order to reduce fall risk with ADLs    Baseline Eval: CGA-min A for all dynamic standing tasks using RW; 12/04/2020: pt is using RW in home with distant supv, but family still provides close supv on stairs; 01/05/2021: Pt now using RW to amb in and around the house, can perform ADLs with RW and modified indep, and can manage stairs at home with modified indep.  No falls reported.     Time 12    Period Weeks    Status Achieved    Target Date 02/01/21                   Plan - 01/22/21 1437     Clinical Impression Statement Pt. is making progress with RUE functioning. Pt. reports that she is now able to brush the back of her hair, and apply lotion with her right hand. Pt. reports that she is able to brush her teeth thoroughly with an electric toothbrush, and turn the toothbrush on, and off. Pt. reports using her right hand to feed herself, and to cut her food. Pt. continues to present with difficulty with [facility] skills using the tweezers. Pt. required the task to be modified in order for her to use her fingers to grasp the pegs from a horizontal position, and turn them to place them in the pegboard vertically. Pt. was able to grasp the vertical thin pegs with the tweezers. Pt. continues to work on improving [facility] skills in order to work towards improving, and maximizing independence with ADLs, and IADL tasks.       OT Occupational Profile and History Detailed Assessment- Review of Records and additional review of physical, cognitive, psychosocial history related to current functional performance    Occupational performance deficits (Please refer to evaluation for details): ADL's;Leisure;IADL's    Body Structure / Function / Physical Skills ADL;Coordination;Endurance;GMC;UE functional use;Balance;Sensation;Body mechanics;IADL;Pain;Dexterity;FMC;Strength;Gait;Mobility    Rehab Potential Good    Clinical Decision  Making Several treatment options, min-mod task modification necessary    Comorbidities Affecting Occupational Performance: May have comorbidities impacting occupational performance    Modification or Assistance to Complete Evaluation  Min-Moderate modification of tasks or assist with assess necessary to complete eval    OT Frequency 2x / week    OT Duration 12 weeks    OT Treatment/Interventions Self-care/ADL training;Therapeutic exercise;DME and/or AE  instruction;Functional Mobility Training;Balance training;Neuromuscular education;Therapeutic activities;Patient/family education    Consulted and Agree with Plan of Care Patient             Patient will benefit from skilled therapeutic intervention in order to improve the following deficits and impairments:   Body Structure / Function / Physical Skills: ADL, Coordination, Endurance, GMC, UE functional use, Balance, Sensation, Body mechanics, IADL, Pain, Dexterity, FMC, Strength, Gait, Mobility       Visit Diagnosis: Muscle weakness (generalized)  Other lack of coordination    Problem List Patient Active Problem List   Diagnosis Date Noted   Carotid stenosis, symptomatic, with infarction (HCC) 11/13/2020   Gout flare 11/10/2020   UTI (urinary tract infection) 11/10/2020   Left carotid artery stenosis 11/10/2020   Chronic kidney disease 11/10/2020   Left basal ganglia embolic stroke (HCC) 10/24/2020   PAC (premature atrial contraction)    Pre-procedural cardiovascular examination    Ischemic stroke (HCC) 10/20/2020   TIA (transient ischemic attack) 10/19/2020   Right hemiparesis (HCC) 10/19/2020   Type 2 diabetes mellitus with stage 3 chronic kidney disease, without long-term current use of insulin (HCC) 08/24/2018   Hyperlipidemia, unspecified 07/19/2017   Hypertension 07/19/2017   PUD (peptic ulcer disease) 07/19/2017   Type 2 diabetes mellitus (HCC) 07/19/2017   Personal history of gout 08/12/2016   Anemia, unspecified 04/09/2016   Hypothyroidism, acquired 12/10/2015    Olegario Messier, MS, OTR/L 01/22/2021, 2:50 PM  [place] [facility] MAIN [facility] SERVICES [address] [place], [place], [address] Phone: [phone]   Fax:  [phone]  Name: Cassandra Schaefer MRN: [id number] Date of Birth: [birth year]

## 2021-01-26 ENCOUNTER — Other Ambulatory Visit: Payer: Self-pay

## 2021-01-26 ENCOUNTER — Ambulatory Visit: Payer: Medicare HMO | Attending: Internal Medicine | Admitting: Occupational Therapy

## 2021-01-26 ENCOUNTER — Ambulatory Visit: Payer: Medicare HMO

## 2021-01-26 ENCOUNTER — Encounter: Payer: Self-pay | Admitting: Occupational Therapy

## 2021-01-26 ENCOUNTER — Encounter: Payer: Medicare HMO | Admitting: Speech Pathology

## 2021-01-26 DIAGNOSIS — R2681 Unsteadiness on feet: Secondary | ICD-10-CM | POA: Insufficient documentation

## 2021-01-26 DIAGNOSIS — R482 Apraxia: Secondary | ICD-10-CM | POA: Insufficient documentation

## 2021-01-26 DIAGNOSIS — R269 Unspecified abnormalities of gait and mobility: Secondary | ICD-10-CM | POA: Insufficient documentation

## 2021-01-26 DIAGNOSIS — M6281 Muscle weakness (generalized): Secondary | ICD-10-CM

## 2021-01-26 DIAGNOSIS — R262 Difficulty in walking, not elsewhere classified: Secondary | ICD-10-CM | POA: Insufficient documentation

## 2021-01-26 DIAGNOSIS — R2689 Other abnormalities of gait and mobility: Secondary | ICD-10-CM | POA: Insufficient documentation

## 2021-01-26 DIAGNOSIS — R278 Other lack of coordination: Secondary | ICD-10-CM | POA: Insufficient documentation

## 2021-01-26 DIAGNOSIS — I639 Cerebral infarction, unspecified: Secondary | ICD-10-CM | POA: Insufficient documentation

## 2021-01-26 NOTE — Therapy (Signed)
[place] MAIN [facility] SERVICES [place], [place], [address] Phone: [phone]   Fax:  [phone]  Physical Therapy Treatment  Patient Details  Name: Cassandra Schaefer MRN: [id number] Date of Birth: 01/23/1929 Referring Provider (PT): Dr. Dew/Dr. Ginette Pitman PCP   Encounter Date: 01/26/2021   PT End of Session - 01/26/21 1607     Visit Number 12    Number of Visits 17    Date for PT Re-Evaluation 01/28/21    Authorization Type [employer]    Authorization Time Period 12/02/20-01/28/21    PT Start Time 1516    PT Stop Time 1558    PT Time Calculation (min) 42 min    Equipment Utilized During Treatment Gait belt    Activity Tolerance Patient tolerated treatment well    Behavior During Therapy WFL for tasks assessed/performed             Past Medical History:  Diagnosis Date   Anemia    Cough    RESOLVING, NO FEVER   Diabetes mellitus without complication ([place])    Gout    Hypertension    Hypothyroidism    PUD (peptic ulcer disease)    Stroke [facility])    Wears dentures    full upper, partial lower    Past Surgical History:  Procedure Laterality Date   AMPUTATION TOE Left 12/14/2017   Procedure: AMPUTATION TOE-4TH MPJ;  Surgeon: Samara Deist, DPM;  Location: [place];  Service: Podiatry;  Laterality: Left;  IVA LOCAL Diabetic - oral meds   APPENDECTOMY     CATARACT EXTRACTION W/PHACO Right 06/23/2015   Procedure: CATARACT EXTRACTION PHACO AND INTRAOCULAR LENS PLACEMENT (IOC);  Surgeon: Estill Cotta, MD;  Location: [facility];  Service: Ophthalmology;  Laterality: Right;  [place] 01:28 AP% 23.4 CDE 36.14 fluid pack lot # [id number]   CATARACT EXTRACTION W/PHACO Left 07/28/2015   Procedure: CATARACT EXTRACTION PHACO AND INTRAOCULAR LENS PLACEMENT (IOC);  Surgeon: Estill Cotta, MD;  Location: [facility];  Service: Ophthalmology;  Laterality: Left;  [place]    1:29.7 AP%  24.9 CDE   41.32 fluid casette lot #[id number] H exp  09/23/2016   COONOSCOPY     AND ENDOSCOPY   DILATION AND CURETTAGE OF UTERUS     ENDARTERECTOMY Left 11/13/2020   Procedure: Evacuation of left neck hematoma;  Surgeon: Katha Cabal, MD;  Location: [facility];  Service: Vascular;  Laterality: Left;   ENDARTERECTOMY Left 11/13/2020   Procedure: ENDARTERECTOMY CAROTID;  Surgeon: Algernon Huxley, MD;  Location: [facility];  Service: Vascular;  Laterality: Left;   TONSILLECTOMY     TUBAL LIGATION      There were no vitals filed for this visit.   Interventions   STS with CGA 1x10, where 4 reps were hands-free, follwed by 1x5 where one rep was hands-free; improved technique from prior session (fewer instances if valgues)     Seated hip abduction with RTB - 1x10, 1x20; rates as easy   At support bar with contact-guard assist: Standing hip abduction 10x each LE; added 2.5# weights for 10 more repetitions each lower extremity, patient reports exercises medium  Standing marches with 2.5# weights on each LE 2x20 alternating  Ambulation for cardiorespiratory and LE mm endurance training - 2x148 ft with 2WW, 2x148-60 ft with 8CZ. As pt fatigues she drags RLE.  Contact guard assist was provided throughout. Two rest breaks throughout.  Nustep (seat 5), level 0 - 5 min and 20 sec, assist with  LE positioning, cuing for SPM >60s. Maintains SPM in 64s.  Contact-guard assist for mount/dismount, patient with brief postural instability getting up from chair where PT provides CGA and pt utilizes UE support on walker handles to regain balance.    Note: Portions of this document were prepared using Dragon voice recognition software and although reviewed may contain unintentional dictation errors in syntax, grammar, or spelling.    PT Education - 01/26/21 1606     Education Details Exercise technique, body mechanics    Person(s) Educated Patient    Methods Explanation;Demonstration;Verbal cues    Comprehension Verbalized understanding;Returned  demonstration;Need further instruction              PT Short Term Goals - 01/22/21 1619       PT SHORT TERM GOAL #1   Title Patient will be adherent to HEP at least 3x a week to improve functional strength and balance for better safety at home.    Baseline 9/21: Pt reports compliance with HEP and is confident    Time 4    Period Weeks    Status Achieved    Target Date 12/31/20      PT SHORT TERM GOAL #2   Title Patient will be independent in transferring sit<>Stand without pushing on arm rests to improve ability to get up from chair.    Baseline 9/21: pt able to complete STS without use of UEs    Time 4    Period Weeks    Status Achieved    Target Date 12/31/20               PT Long Term Goals - 01/22/21 1619       PT LONG TERM GOAL #1   Title Patient (> 75 years old) will complete five times sit to stand test in < 15 seconds indicating an increased LE strength and improved balance.    Baseline 9/21: 29 seconds hands-free    Time 8    Period Weeks    Status On-going    Target Date 01/28/21      PT LONG TERM GOAL #2   Title Patient will increase six minute walk test distance to >400 feet for improve gait ability    Baseline 9/21: 368 ft with RW    Time 8    Period Weeks    Status Partially Met    Target Date 01/28/21      PT LONG TERM GOAL #3   Title Patient will increase 10 meter walk test to >1.63ms as to improve gait speed for better community ambulation and to reduce fall risk.    Baseline 9/21: 0.5 m/s with RW    Time 8    Period Weeks    Status On-going    Target Date 01/28/21      PT LONG TERM GOAL #4   Title Patient will be independent with ascend/descend 6 steps using single UE in step over step pattern without LOB.    Baseline 9/21:  Patient uses PT stairs (4 steps) with bilateral upper extremity support on handrails for both ascending/descending.  Patient exhibits reciprocal pattern ascending and step to pattern descending.    Time 8     Period Weeks    Status On-going    Target Date 01/28/21      PT LONG TERM GOAL #5   Title Patient will demonstrate an improved Berg Balance Score of >45/56 as to demonstrate improved balance with ADLs such as sitting/standing and transfer  balance and reduced fall risk.    Baseline 9/21: deferred d/t time; 9/29: 42/56    Time 8    Period Weeks    Status On-going    Target Date 01/28/21      PT LONG TERM GOAL #6   Title Patient will improve FOTO score to >60% to indicate improved functional mobility with ADLs.    Baseline 9/21: 59    Time 8    Period Weeks    Status Partially Met    Target Date 01/28/21                   Plan - 01/26/21 1608     Clinical Impression Statement Patient shows improved technique with sit to stands this is session.  Patient reports greater awareness of reducing knee valgus and was able to perform 5 reps hands-free, indicating improved bilateral lower extremity strength.  Patient still very challenged with cardiorespiratory and muscular endurance, reporting fatigue with gait and NuStep endurance training.  Patient starts to drag right lower extremity as she begins to fatigue with ambulation.  The patient will benefit from further skilled PT to continue to improve strength, gait, balance, and mobility to decrease fall risk and improve quality of life.    Personal Factors and Comorbidities Age;Comorbidity 3+;Fitness    Comorbidities HTN, CKD, fall risk, type 2 diabetes,    Examination-Activity Limitations Lift;Locomotion Level;Squat;Stairs;Stand;Toileting;Transfers;Bathing    Examination-Participation Restrictions Cleaning;Community Activity;Laundry;Meal Prep;Shop;Volunteer;Driving    Stability/Clinical Decision Making Stable/Uncomplicated    Rehab Potential Good    PT Frequency 2x / week    PT Duration 8 weeks    PT Treatment/Interventions Cryotherapy;Electrical Stimulation;Moist Heat;Gait training;Stair training;Functional mobility  training;Therapeutic activities;Therapeutic exercise;Neuromuscular re-education;Balance training;Patient/family education;Orthotic Fit/Training;Energy conservation    PT Next Visit Plan work on LE strengthening and balance; gait training, endurance    PT Home Exercise Plan Access Code: [email or web address]; no updates             Patient will benefit from skilled therapeutic intervention in order to improve the following deficits and impairments:  Abnormal gait, Decreased balance, Decreased endurance, Decreased mobility, Difficulty walking, Cardiopulmonary status limiting activity, Decreased activity tolerance, Decreased coordination, Decreased strength  Visit Diagnosis: Muscle weakness (generalized)  Other abnormalities of gait and mobility  Unsteadiness on feet     Problem List Patient Active Problem List   Diagnosis Date Noted   Carotid stenosis, symptomatic, with infarction ([place]) 11/13/2020   Gout flare 11/10/2020   UTI (urinary tract infection) 11/10/2020   Left carotid artery stenosis 11/10/2020   Chronic kidney disease 11/10/2020   Left basal ganglia embolic stroke ([place]) 96/22/2979   PAC (premature atrial contraction)    Pre-procedural cardiovascular examination    Ischemic stroke ([place]) 10/20/2020   TIA (transient ischemic attack) 10/19/2020   Right hemiparesis ([place]) 10/19/2020   Type 2 diabetes mellitus with stage 3 chronic kidney disease, without long-term current use of insulin ([place]) 08/24/2018   Hyperlipidemia, unspecified 07/19/2017   Hypertension 07/19/2017   PUD (peptic ulcer disease) 07/19/2017   Type 2 diabetes mellitus ([place]) 07/19/2017   Personal history of gout 08/12/2016   Anemia, unspecified 04/09/2016   Hypothyroidism, acquired 12/10/2015    Zollie Pee, PT 01/26/2021, 4:15 PM  [place] MAIN [facility] SERVICES [address] [place], [place], [address] Phone: [phone]   Fax:  [phone]  Name: Cassandra Schaefer MRN:  [id number] Date of Birth: 07-19-1928

## 2021-01-26 NOTE — Therapy (Signed)
[place] [facility] MAIN [facility] SERVICES [address] [place], [place], [address] Phone: [phone]   Fax:  [phone]  Occupational Therapy Treatment  Patient Details  Name: Cassandra Schaefer MRN: [id number] Date of Birth: Jan 21, 1929 Referring Provider (OT): Dr. Barbette Reichmann   Encounter Date: 01/26/2021   OT End of Session - 01/26/21 1606     Visit Number 15    Number of Visits 24    Date for OT Re-Evaluation 02/01/21    Authorization Time Period Reporting period starting 11/10/2020    OT Start Time 1600    OT Stop Time 1645    OT Time Calculation (min) 45 min    Activity Tolerance Patient tolerated treatment well    Behavior During Therapy WFL for tasks assessed/performed             Past Medical History:  Diagnosis Date   Anemia    Cough    RESOLVING, NO FEVER   Diabetes mellitus without complication (HCC)    Gout    Hypertension    Hypothyroidism    PUD (peptic ulcer disease)    Stroke [facility])    Wears dentures    full upper, partial lower    Past Surgical History:  Procedure Laterality Date   AMPUTATION TOE Left 12/14/2017   Procedure: AMPUTATION TOE-4TH MPJ;  Surgeon: Gwyneth Revels, DPM;  Location: [facility];  Service: Podiatry;  Laterality: Left;  IVA LOCAL Diabetic - oral meds   APPENDECTOMY     CATARACT EXTRACTION W/PHACO Right 06/23/2015   Procedure: CATARACT EXTRACTION PHACO AND INTRAOCULAR LENS PLACEMENT (IOC);  Surgeon: Sallee Lange, MD;  Location: [facility] ORS;  Service: Ophthalmology;  Laterality: Right;  [place] 01:28 AP% 23.4 CDE 36.14 fluid pack lot # [id number]   CATARACT EXTRACTION W/PHACO Left 07/28/2015   Procedure: CATARACT EXTRACTION PHACO AND INTRAOCULAR LENS PLACEMENT (IOC);  Surgeon: Sallee Lange, MD;  Location: [facility] ORS;  Service: Ophthalmology;  Laterality: Left;  [place]    1:29.7 AP%  24.9 CDE   41.32 fluid casette lot #[id number] H exp 09/23/2016   COONOSCOPY     AND ENDOSCOPY   DILATION AND  CURETTAGE OF UTERUS     ENDARTERECTOMY Left 11/13/2020   Procedure: Evacuation of left neck hematoma;  Surgeon: Renford Dills, MD;  Location: [facility] ORS;  Service: Vascular;  Laterality: Left;   ENDARTERECTOMY Left 11/13/2020   Procedure: ENDARTERECTOMY CAROTID;  Surgeon: Annice Needy, MD;  Location: [facility] ORS;  Service: Vascular;  Laterality: Left;   TONSILLECTOMY     TUBAL LIGATION      There were no vitals filed for this visit.   Subjective Assessment - 01/26/21 1604     Subjective  Pt. reports having gout in the right ankle.    Patient is accompanied by: Family member    Pertinent History R ankle pain, gout, T2DM, HTN, CKDIII; R/L carotid endarectomy    Currently in Pain? No/denies            OT TREATMENT     Neuro muscular re-education:   Pt. worked on [facility] skills grasping 1" sticks, 1/4" collars, and 1/4" washers. Pt. worked on grasping, and storing the objects in the palm of her hand.  Pt. worked ot translatory movements moving the objects through her hand from her palm to the tip of her 2nd digit and thumb. Pt. worked on removing the pegs using bilateral alternating hand patterns.    Therapeutic Exercise:   Pt. performed gross gripping with  a gross grip strengthener x's 2 reps. Pt. worked on sustaining grip while grasping pegs and reaching at various heights. The gripper was set to 11.2 # of grip strength resistance.     Pt. is improving with right hand function, and is now using her right hand more during ADL, and IADL tasks at home. Pt. continues to present with difficulty with [facility] skills manipulating small objects. Pt. presented with slower bilateral alternating hand movements when removing the 1" sticks. Pt. continues to work on improving [facility] skills in order to work towards improving, and maximizing independence with ADLs, and IADL tasks.                              OT Education - 01/26/21 1606     Education Details [facility]    Person(s) Educated  Patient    Methods Explanation;Verbal cues    Comprehension Verbalized understanding              OT Short Term Goals - 01/05/21 1351       OT SHORT TERM GOAL #1   Title Pt will perform HEP for RUE strength/coordination independently.    Baseline Eval: HEP not yet established in outpatient, but pt is working with putty from [name]; 12/04/2020; [facility] HEP initiated this day with mod vc after return demo; 01/05/2021: indep with currently program, but ongoing as pt progresses    Time 6    Period Weeks    Status On-going    Target Date 01/22/21               OT Long Term Goals - 01/05/21 1352       OT LONG TERM GOAL #1   Title Pt will improve hand writing to 100% legibility with R hand to be able to independently write a check.    Baseline Eval: signature is 75% legible with R hand, requiring extra time; 12/04/2020: no change from eval; 01/05/2021: Printing is 100% legible, signature is 90% legible, but still requires extra time and effort.    Time 12    Period Weeks    Status On-going    Target Date 02/01/21      OT LONG TERM GOAL #2   Title Pt will improve R hand coordination to enable good manipulation of iphone using R dominant hand    Baseline Eval: Pt uses L hand d/t R hand lacking sufficient FMC to press buttons on phone; 12/04/2020: no change from eval; 01/05/2021: R hand coordination is improving but pt still uses L hand to dial phone.    Time 12    Period Weeks    Status On-going    Target Date 02/01/21      OT LONG TERM GOAL #3   Title Pt will improve GMC throughout RUE to enable pt to reach for and pick up ADL supplies with R dominant hand without dropping or knocking over objects.    Baseline Eval: Pt reports RUE is clumsy and easily knocks over objects when trying to reach for ADL supplies.  Pt verbalizes that her "aim is off."; 12/04/2020: pt reports slight improvement since eval and has started using her R hand to eat, but still feels clumsy; 01/05/2021: Greatly  improved; pt is using silverware to eat with R hand, can comb hair with RUE, still mildly ataxic    Time 12    Period Weeks    Status On-going    Target Date  02/01/21      OT LONG TERM GOAL #4   Title Pt will improve FOTO score to 68 or better to indicate measurable functional improvement.    Baseline Eval: FOTO 55; 01/05/2021: FOTO 61    Time 12    Period Weeks    Status On-going    Target Date 02/01/21      OT LONG TERM GOAL #5   Title Pt will perform dynamic standing tasks with modified indep, AD as needed in order to reduce fall risk with ADLs    Baseline Eval: CGA-min A for all dynamic standing tasks using RW; 12/04/2020: pt is using RW in home with distant supv, but family still provides close supv on stairs; 01/05/2021: Pt now using RW to amb in and around the house, can perform ADLs with RW and modified indep, and can manage stairs at home with modified indep.  No falls reported.    Time 12    Period Weeks    Status Achieved    Target Date 02/01/21                   Plan - 01/26/21 1609     Clinical Impression Statement Pt. is improving with right hand function, and is now using her right hand more during ADL, and IADL tasks at home. Pt. continues to present with difficulty with [facility] skills manipulating small objects. Pt. presented with slower bilateral alternating hand movements when removing the 1" sticks. Pt. continues to work on improving [facility] skills in order to work towards improving, and maximizing independence with ADLs, and IADL tasks.   OT Occupational Profile and History Detailed Assessment- Review of Records and additional review of physical, cognitive, psychosocial history related to current functional performance    Occupational performance deficits (Please refer to evaluation for details): ADL's;Leisure;IADL's    Body Structure / Function / Physical Skills ADL;Coordination;Endurance;GMC;UE functional use;Balance;Sensation;Body  mechanics;IADL;Pain;Dexterity;FMC;Strength;Gait;Mobility    Rehab Potential Good    Clinical Decision Making Several treatment options, min-mod task modification necessary    Comorbidities Affecting Occupational Performance: May have comorbidities impacting occupational performance    Modification or Assistance to Complete Evaluation  Min-Moderate modification of tasks or assist with assess necessary to complete eval    OT Frequency 2x / week    OT Duration 12 weeks    OT Treatment/Interventions Self-care/ADL training;Therapeutic exercise;DME and/or AE instruction;Functional Mobility Training;Balance training;Neuromuscular education;Therapeutic activities;Patient/family education    Consulted and Agree with Plan of Care Patient             Patient will benefit from skilled therapeutic intervention in order to improve the following deficits and impairments:   Body Structure / Function / Physical Skills: ADL, Coordination, Endurance, GMC, UE functional use, Balance, Sensation, Body mechanics, IADL, Pain, Dexterity, FMC, Strength, Gait, Mobility       Visit Diagnosis: Muscle weakness (generalized)  Other lack of coordination    Problem List Patient Active Problem List   Diagnosis Date Noted   Carotid stenosis, symptomatic, with infarction (HCC) 11/13/2020   Gout flare 11/10/2020   UTI (urinary tract infection) 11/10/2020   Left carotid artery stenosis 11/10/2020   Chronic kidney disease 11/10/2020   Left basal ganglia embolic stroke (HCC) 10/24/2020   PAC (premature atrial contraction)    Pre-procedural cardiovascular examination    Ischemic stroke (HCC) 10/20/2020   TIA (transient ischemic attack) 10/19/2020   Right hemiparesis (HCC) 10/19/2020   Type 2 diabetes mellitus with stage 3 chronic kidney disease, without long-term  current use of insulin (HCC) 08/24/2018   Hyperlipidemia, unspecified 07/19/2017   Hypertension 07/19/2017   PUD (peptic ulcer disease) 07/19/2017    Type 2 diabetes mellitus (HCC) 07/19/2017   Personal history of gout 08/12/2016   Anemia, unspecified 04/09/2016   Hypothyroidism, acquired 12/10/2015    Olegario Messier, MS, OTR/L 01/26/2021, 4:15 PM  [place] [facility] MAIN [facility] SERVICES [address] [place], [place], [address] Phone: [phone]   Fax:  [phone]  Name: Cassandra Schaefer MRN: [id number] Date of Birth: 08-21-1928

## 2021-01-28 ENCOUNTER — Other Ambulatory Visit: Payer: Self-pay

## 2021-01-28 ENCOUNTER — Ambulatory Visit: Payer: Medicare HMO

## 2021-01-28 ENCOUNTER — Encounter: Payer: Medicare HMO | Admitting: Speech Pathology

## 2021-01-28 DIAGNOSIS — R2689 Other abnormalities of gait and mobility: Secondary | ICD-10-CM

## 2021-01-28 DIAGNOSIS — M6281 Muscle weakness (generalized): Secondary | ICD-10-CM

## 2021-01-28 DIAGNOSIS — R278 Other lack of coordination: Secondary | ICD-10-CM

## 2021-01-28 DIAGNOSIS — R2681 Unsteadiness on feet: Secondary | ICD-10-CM

## 2021-01-28 DIAGNOSIS — I639 Cerebral infarction, unspecified: Secondary | ICD-10-CM

## 2021-01-28 NOTE — Therapy (Signed)
[place] [facility] MAIN [facility] SERVICES [address] [place], [place], [address] Phone: [phone]   Fax:  [phone]  Occupational Therapy Recertification Patient Details  Name: Cassandra Schaefer MRN: [id number] Date of Birth: 1928-11-11 Referring Provider (OT): Dr. Barbette Reichmann   Encounter Date: 01/28/2021   OT End of Session - 01/28/21 1642     Visit Number 16    Number of Visits 40    Date for OT Re-Evaluation 04/21/21    Authorization Time Period Reporting period starting 01/07/2021    OT Start Time 1345    OT Stop Time 1430    OT Time Calculation (min) 45 min    Equipment Utilized During Treatment wc    Activity Tolerance Patient tolerated treatment well    Behavior During Therapy WFL for tasks assessed/performed             Past Medical History:  Diagnosis Date   Anemia    Cough    RESOLVING, NO FEVER   Diabetes mellitus without complication (HCC)    Gout    Hypertension    Hypothyroidism    PUD (peptic ulcer disease)    Stroke [facility])    Wears dentures    full upper, partial lower    Past Surgical History:  Procedure Laterality Date   AMPUTATION TOE Left 12/14/2017   Procedure: AMPUTATION TOE-4TH MPJ;  Surgeon: Gwyneth Revels, DPM;  Location: [facility];  Service: Podiatry;  Laterality: Left;  IVA LOCAL Diabetic - oral meds   APPENDECTOMY     CATARACT EXTRACTION W/PHACO Right 06/23/2015   Procedure: CATARACT EXTRACTION PHACO AND INTRAOCULAR LENS PLACEMENT (IOC);  Surgeon: Sallee Lange, MD;  Location: [facility] ORS;  Service: Ophthalmology;  Laterality: Right;  [place] 01:28 AP% 23.4 CDE 36.14 fluid pack lot # [id number]   CATARACT EXTRACTION W/PHACO Left 07/28/2015   Procedure: CATARACT EXTRACTION PHACO AND INTRAOCULAR LENS PLACEMENT (IOC);  Surgeon: Sallee Lange, MD;  Location: [facility] ORS;  Service: Ophthalmology;  Laterality: Left;  [place]    1:29.7 AP%  24.9 CDE   41.32 fluid casette lot #[id number] H exp 09/23/2016    COONOSCOPY     AND ENDOSCOPY   DILATION AND CURETTAGE OF UTERUS     ENDARTERECTOMY Left 11/13/2020   Procedure: Evacuation of left neck hematoma;  Surgeon: Renford Dills, MD;  Location: [facility] ORS;  Service: Vascular;  Laterality: Left;   ENDARTERECTOMY Left 11/13/2020   Procedure: ENDARTERECTOMY CAROTID;  Surgeon: Annice Needy, MD;  Location: [facility] ORS;  Service: Vascular;  Laterality: Left;   TONSILLECTOMY     TUBAL LIGATION      There were no vitals filed for this visit.   Subjective Assessment - 01/28/21 1640     Subjective  Pt verbalizing need to continue to work on picking up small objects with her R hand.    Patient is accompanied by: Family member    Pertinent History R ankle pain, gout, T2DM, HTN, CKDIII; R/L carotid endarectomy    Patient Stated Goals "I want to improve my aim when I'm using my R arm."    Currently in Pain? No/denies    Pain Score 0-No pain    Pain Onset In the past 7 days             [facility] OT Assessment - 01/28/21 0001       Assessment   Medical Diagnosis CVA with R sided hemiparesis    Hand Dominance Right      Observation/Other  Assessments   Focus on Therapeutic Outcomes (FOTO)  61 (improved from 55)      Coordination   Right 9 Hole Peg Test 1 min 3 seconds (improved by 16 seconds from last progress note)    Left 9 Hole Peg Test 47 sec      Hand Function   Right Hand Grip (lbs) 24    Right Hand Lateral Pinch 11 lbs    Right Hand 3 Point Pinch 10 lbs    Left Hand Grip (lbs) 30    Left Hand Lateral Pinch 11 lbs    Left 3 point pinch 9 lbs             Occupational Therapy Treatment/Recertification: Neuro re-ed: Participated in small object pick up and manipulation picking up small pegs and placing into grid.  Pt required min vc to minimize elbow mobility and instead utilize fingertips and thumb to reposition pegs in hand to place and push pegs into grid.  Occasional dropping of pegs, requiring repeated attempts and extra time.   Practiced picking up multiple pegs and storing in palm of hand, then discarding 1 at a time.  Occasional dropping but improving ability to transfer small items from palm of hand to tips of fingers and thumb.   Therapeutic Activity: Measurements taken for therapy recertification and goal updates.  See note for details.   Response to Treatment: Pt is making steady gains with use of RUE for self care.  Pt consistently uses R dominant/affected arm for self feeding and is steadily reducing mess/spills as long as she takes her time.  Pt is improving with her ability to pick up small objects from table top, and is working to more effectively transition objects from palm of hand to fingertips and thumb for small object manipulation.  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  Pt has improved with ability to use R hand to dial numbers and press apps on her phone, and notes that she is using R hand ~50% of the time for phone activities.  Pt with continued need to improve GMC to prevent knocking over objects when reaching with RUE within a small space (I.e. pt reports that she knocks over her toothbrush holder when reaching for her toothbrush) and to improve FMC to improve accuracy and efficiency with manipulation of small objects (coins, buttons on phone).      OT Education - 01/28/21 1641     Education Details Encouraged continued focus on gripping and pinching with theraputty for R hand    Person(s) Educated Patient    Methods Explanation;Verbal cues    Comprehension Verbalized understanding              OT Short Term Goals - 01/28/21 1644       OT SHORT TERM GOAL #1   Title Pt will perform HEP for RUE strength/coordination independently.    Baseline Eval: HEP not yet established in  outpatient, but pt is working with putty from [name]; 12/04/2020; [facility] HEP initiated this day with mod vc after return demo; 01/05/2021: indep with currently program, but ongoing as pt progresses; 01/28/2021: vc to revisit and focus on grip and pinch strengthening with putty    Time 6    Period Weeks    Status On-going  Target Date 03/10/21               OT Long Term Goals - 01/28/21 1712       OT LONG TERM GOAL #1   Title Pt will improve hand writing to 100% legibility with R hand to be able to independently write a check.    Baseline Eval: signature is 75% legible with R hand, requiring extra time; 12/04/2020: no change from eval; 01/05/2021: Printing is 100% legible, signature is 90% legible, but still requires extra time and effort.; 01/26/21: Signature is 90% legible and speed/fluidity have improved    Time 12    Period Weeks    Status On-going    Target Date 04/21/21      OT LONG TERM GOAL #2   Title Pt will improve R hand coordination to enable good manipulation of iphone using R dominant hand    Baseline Eval: Pt uses L hand d/t R hand lacking sufficient FMC to press buttons on phone; 12/04/2020: no change from eval; 01/05/2021: R hand coordination is improving but pt still uses L hand to dial phone; 01/28/21: Pt using R dominant hand to press buttons and apps on phone 50% of the time.    Time 12    Period Weeks    Status On-going    Target Date 04/21/21      OT LONG TERM GOAL #3   Title Pt will improve GMC throughout RUE to enable pt to reach for and pick up ADL supplies with R dominant hand without dropping or knocking over objects.    Baseline Eval: Pt reports RUE is clumsy and easily knocks over objects when trying to reach for ADL supplies.  Pt verbalizes that her "aim is off."; 12/04/2020: pt reports slight improvement since eval and has started using her R hand to eat, but still feels clumsy; 01/05/2021: Greatly improved; pt is using silverware to eat with R hand, can comb hair  with RUE, still mildly ataxic; 01/26/21: pt reports difficulty picking up objects within a small space (ie; may knock the toothpaste holder down when reaching for toothbrush), but reaching for light/larger objects is improving    Time 12    Period Weeks    Status On-going    Target Date 04/21/21      OT LONG TERM GOAL #4   Title Pt will improve FOTO score to 68 or better to indicate measurable functional improvement.    Baseline Eval: FOTO 55; 01/05/2021: FOTO 61; 01/28/21: FOTO 61    Time 12    Period Weeks    Status On-going    Target Date 04/21/21      OT LONG TERM GOAL #5   Title Pt will perform dynamic standing tasks with modified indep, AD as needed in order to reduce fall risk with ADLs    Baseline Eval: CGA-min A for all dynamic standing tasks using RW; 12/04/2020: pt is using RW in home with distant supv, but family still provides close supv on stairs; 01/05/2021: Pt now using RW to amb in and around the house, can perform ADLs with RW and modified indep, and can manage stairs at home with modified indep.  No falls reported.    Time 12    Period Weeks    Status Achieved    Target Date 02/01/21               Plan - 01/28/21 1705     Clinical Impression Statement Pt is making steady  gains with use of RUE for self care.  Pt consistently uses R dominant/affected arm for self feeding and is steadily reducing mess/spills as long as she takes her time.  Pt is improving with her ability to pick up small objects from table top, and is working to more effectively transition objects from palm of hand to fingertips and thumb for small object manipulation.  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  Pt has improved with ability to use R hand to dial numbers and press apps on her phone,  and notes that she is using R hand ~50% of the time for phone activities.  Pt with continued need to improve GMC to prevent knocking over objects when reaching with RUE within a small space (I.e. pt reports that she knocks over her toothbrush holder when reaching for her toothbrush) and to improve FMC to improve accuracy and efficiency with manipulation of small objects (coins, buttons on phone).    OT Occupational Profile and History Detailed Assessment- Review of Records and additional review of physical, cognitive, psychosocial history related to current functional performance    Occupational performance deficits (Please refer to evaluation for details): ADL's;Leisure;IADL's    Body Structure / Function / Physical Skills ADL;Coordination;Endurance;GMC;UE functional use;Balance;Sensation;Body mechanics;IADL;Pain;Dexterity;FMC;Strength;Gait;Mobility    Rehab Potential Good    Clinical Decision Making Several treatment options, min-mod task modification necessary    Comorbidities Affecting Occupational Performance: May have comorbidities impacting occupational performance    Modification or Assistance to Complete Evaluation  Min-Moderate modification of tasks or assist with assess necessary to complete eval    OT Frequency 2x / week    OT Duration 12 weeks    OT Treatment/Interventions Self-care/ADL training;Therapeutic exercise;DME and/or AE instruction;Functional Mobility Training;Balance training;Neuromuscular education;Therapeutic activities;Patient/family education    Consulted and Agree with Plan of Care Patient             Patient will benefit from skilled therapeutic intervention in order to improve the following deficits and impairments:   Body Structure / Function / Physical Skills: ADL, Coordination, Endurance, GMC, UE functional use, Balance, Sensation, Body mechanics, IADL, Pain, Dexterity, FMC, Strength, Gait, Mobility       Visit Diagnosis: Muscle weakness  (generalized)  Other lack of coordination  Left basal ganglia embolic stroke [facility])    Problem List Patient Active Problem List   Diagnosis Date Noted   Carotid stenosis, symptomatic, with infarction (HCC) 11/13/2020   Gout flare 11/10/2020   UTI (urinary tract infection) 11/10/2020   Left carotid artery stenosis 11/10/2020   Chronic kidney disease 11/10/2020   Left basal ganglia embolic stroke (HCC) 10/24/2020   PAC (premature atrial contraction)    Pre-procedural cardiovascular examination    Ischemic stroke (HCC) 10/20/2020   TIA (transient ischemic attack) 10/19/2020   Right hemiparesis (HCC) 10/19/2020   Type 2 diabetes mellitus with stage 3 chronic kidney disease, without long-term current use of insulin (HCC) 08/24/2018   Hyperlipidemia, unspecified 07/19/2017   Hypertension 07/19/2017   PUD (peptic ulcer disease) 07/19/2017   Type 2 diabetes mellitus (HCC) 07/19/2017   Personal history of gout 08/12/2016   Anemia, unspecified 04/09/2016   Hypothyroidism, acquired 12/10/2015   Danelle Earthly, MS, OTR/L  [employer] Dials, OT/L 01/28/2021, 5:16 PM  [place] [facility] MAIN  [facility] SERVICES [address] [place], [place], [address] Phone: [phone]   Fax:  [phone]  Name: Cassandra Schaefer MRN: [id number] Date of Birth: Feb 23, 1929

## 2021-01-28 NOTE — Therapy (Signed)
[place] MAIN [facility] SERVICES [place], [place], [address] Phone: [phone]   Fax:  [phone]  Physical Therapy Treatment  Patient Details  Name: Cassandra Schaefer MRN: [id number] Date of Birth: 06/12/1928 Referring Provider (PT): Dr. Dew/Dr. Ginette Pitman PCP   Encounter Date: 01/28/2021   PT End of Session - 01/28/21 1548     Visit Number 13    Number of Visits 17    Date for PT Re-Evaluation 01/28/21    Authorization Type [employer]    Authorization Time Period 12/02/20-01/28/21    PT Start Time 1430    PT Stop Time 1516    PT Time Calculation (min) 46 min    Equipment Utilized During Treatment Gait belt    Activity Tolerance Patient tolerated treatment well    Behavior During Therapy WFL for tasks assessed/performed             Past Medical History:  Diagnosis Date   Anemia    Cough    RESOLVING, NO FEVER   Diabetes mellitus without complication ([place])    Gout    Hypertension    Hypothyroidism    PUD (peptic ulcer disease)    Stroke [facility])    Wears dentures    full upper, partial lower    Past Surgical History:  Procedure Laterality Date   AMPUTATION TOE Left 12/14/2017   Procedure: AMPUTATION TOE-4TH MPJ;  Surgeon: Samara Deist, DPM;  Location: [facility];  Service: Podiatry;  Laterality: Left;  IVA LOCAL Diabetic - oral meds   APPENDECTOMY     CATARACT EXTRACTION W/PHACO Right 06/23/2015   Procedure: CATARACT EXTRACTION PHACO AND INTRAOCULAR LENS PLACEMENT (IOC);  Surgeon: Estill Cotta, MD;  Location: [facility];  Service: Ophthalmology;  Laterality: Right;  [place] 01:28 AP% 23.4 CDE 36.14 fluid pack lot # [id number]   CATARACT EXTRACTION W/PHACO Left 07/28/2015   Procedure: CATARACT EXTRACTION PHACO AND INTRAOCULAR LENS PLACEMENT (IOC);  Surgeon: Estill Cotta, MD;  Location: [facility];  Service: Ophthalmology;  Laterality: Left;  [place]    1:29.7 AP%  24.9 CDE   41.32 fluid casette lot #[id number] H exp  09/23/2016   COONOSCOPY     AND ENDOSCOPY   DILATION AND CURETTAGE OF UTERUS     ENDARTERECTOMY Left 11/13/2020   Procedure: Evacuation of left neck hematoma;  Surgeon: Katha Cabal, MD;  Location: [facility];  Service: Vascular;  Laterality: Left;   ENDARTERECTOMY Left 11/13/2020   Procedure: ENDARTERECTOMY CAROTID;  Surgeon: Algernon Huxley, MD;  Location: [facility];  Service: Vascular;  Laterality: Left;   TONSILLECTOMY     TUBAL LIGATION      There were no vitals filed for this visit.   Subjective Assessment - 01/28/21 1431     Subjective Pt reports she was fatigued after last appointment. Pt reports she stood just with holding her cane and she felt OK. She reports her granddaughter was next to her. She says she was proud of herself.    Patient is accompained by: Family member   Daughter, [name]   Pertinent History 87 y.o. female with medical history significant for type 2 diabetes mellitus, essential hypertension, acquired hypothyroidism, hyperlipidemia, stage III chronic kidney disease reports increased right sided weakness on 10/19/20. She was diagnosed with left basal ganglia ischemic stroke. She was discharged to inpatient rehab for 10 days and then discharged home. Patient was evaluated by outpatient PT on 11/12/20. CVA was due to carotid stenosis. She underwent left carotid  endartectomy on 7/21. Procedure went well however patient suffered from left cervical hematoma and had subsequent surgical procedure to stop the bleed and remove the hematoma on 11/13/20. They were concerned with her breathing and therefore intubated her for 1 day, she was in ICU for 2 days and transferred to med/surge on 11/15/20. Patient was discharged home on 11/17/20 and is now returning to outpatient PT. She has had the stiches removed and is healing well. Denies any neck pain or stiffness. She lives with her daughter and has 24/7 caregiver support. She is still using RW for most ambulation. She is able to transfer to  bedside commode well and is able to stand short time unsupported. She reports feeling very weak and fatigued. She reports she has been dragging her right foot more. She denies any numbness/tingling; She reports feeling like her legs are wanting to do more but is hesitant because of fear of falling. She is mod I for self care morning routine. She does still receive help with showering. She has been working on increasing her activity but denies any formal exercise.    Limitations Standing;Walking    How long can you sit comfortably? NA    How long can you stand comfortably? 30-60 min with support    How long can you walk comfortably? about 100 feet with RW, still limited to short distances;    Diagnostic tests MRI shows left basal ganglia infarct 10/21/20    Patient Stated Goals "Improve balance and walking and not need RW, get back to independent."    Currently in Pain? No/denies    Pain Onset In the past 7 days              Interventions   STS with CGA 1x5 hand-free, 1x4 hands-free except for 2 reps with min assist from PT. Last set of 1x5 with BUE assist off of chair.  At support bar with contact-guard assist: Standing hip abduction 2x12 each LE; added 2.5# weights for 10 more repetitions each lower extremity  LAQ with 2.5# weights on each LE -2x20 Standing heel raises with UE support on bar - 2x10  Neuro: Standing at support surface with CGA from PT- EO, NBOS, no UE support - 30 sec; no LOB EC, NBOS, no UE support - 30 sec; no LOB --progressed to with 10 vertical and horizontal head turns each direction. Pt continues to maintain balance. Pt standing on airex pad- WBOS, EO -2x30 sec; pt rates difficult. Use of ankle righting strategies.  Semi-tandem 2x30 sec each LE. Pt rates as a challenge. Decreased postural stability but no LOB. Marches on airex pad, cuing to perform at decreased speed. Pt starts with UUE support on bar, attempts no UE support but loses balance (PT provides min a  to help pt regain balance). Pt able to decrease support to a minimum of 3 fingers on bar. Performs multiple reps. Rate exercise as hard.    Gait- Ambulation for cardiorespiratory and LE mm endurance training - 4x148 ft with 4WW, CGA throughout. Pt continues to drag RLE with fatigue. PT cues pt for heel-strike, instructs pt in correct use of breaks with 4WW. Continued cuing for increased B-step length.            PT Education - 01/28/21 1548     Education Details exercise technique, body mechanics, focus on gait mechanics with R heel strike    Person(s) Educated Patient    Methods Explanation;Demonstration;Tactile cues;Verbal cues    Comprehension Verbalized understanding;Returned demonstration;Need  further instruction;Verbal cues required              PT Short Term Goals - 01/22/21 1619       PT SHORT TERM GOAL #1   Title Patient will be adherent to HEP at least 3x a week to improve functional strength and balance for better safety at home.    Baseline 9/21: Pt reports compliance with HEP and is confident    Time 4    Period Weeks    Status Achieved    Target Date 12/31/20      PT SHORT TERM GOAL #2   Title Patient will be independent in transferring sit<>Stand without pushing on arm rests to improve ability to get up from chair.    Baseline 9/21: pt able to complete STS without use of UEs    Time 4    Period Weeks    Status Achieved    Target Date 12/31/20               PT Long Term Goals - 01/22/21 1619       PT LONG TERM GOAL #1   Title Patient (> 56 years old) will complete five times sit to stand test in < 15 seconds indicating an increased LE strength and improved balance.    Baseline 9/21: 29 seconds hands-free    Time 8    Period Weeks    Status On-going    Target Date 01/28/21      PT LONG TERM GOAL #2   Title Patient will increase six minute walk test distance to >400 feet for improve gait ability    Baseline 9/21: 368 ft with RW    Time 8     Period Weeks    Status Partially Met    Target Date 01/28/21      PT LONG TERM GOAL #3   Title Patient will increase 10 meter walk test to >1.55ms as to improve gait speed for better community ambulation and to reduce fall risk.    Baseline 9/21: 0.5 m/s with RW    Time 8    Period Weeks    Status On-going    Target Date 01/28/21      PT LONG TERM GOAL #4   Title Patient will be independent with ascend/descend 6 steps using single UE in step over step pattern without LOB.    Baseline 9/21:  Patient uses PT stairs (4 steps) with bilateral upper extremity support on handrails for both ascending/descending.  Patient exhibits reciprocal pattern ascending and step to pattern descending.    Time 8    Period Weeks    Status On-going    Target Date 01/28/21      PT LONG TERM GOAL #5   Title Patient will demonstrate an improved Berg Balance Score of >45/56 as to demonstrate improved balance with ADLs such as sitting/standing and transfer balance and reduced fall risk.    Baseline 9/21: deferred d/t time; 9/29: 42/56    Time 8    Period Weeks    Status On-going    Target Date 01/28/21      PT LONG TERM GOAL #6   Title Patient will improve FOTO score to >60% to indicate improved functional mobility with ADLs.    Baseline 9/21: 59    Time 8    Period Weeks    Status Partially Met    Target Date 01/28/21  Plan - 01/28/21 1549     Clinical Impression Statement Pt continues to improve STS performing 7 reps hands-free this session, indicating improvement in LE strength and increased ease wtih transfers. Pt also performs multiple interventions this session at increased volume indicating overall improvement with activity tolerance. While pt shows progress, she still has difficulty maintaining gait mechanics with RLE as she fatigues. The pt will benefit from further skilled PT to improve strength, gait, balance and mobility to decrease fall risk and improve QOL.     Personal Factors and Comorbidities Age;Comorbidity 3+;Fitness    Comorbidities HTN, CKD, fall risk, type 2 diabetes,    Examination-Activity Limitations Lift;Locomotion Level;Squat;Stairs;Stand;Toileting;Transfers;Bathing    Examination-Participation Restrictions Cleaning;Community Activity;Laundry;Meal Prep;Shop;Volunteer;Driving    Stability/Clinical Decision Making Stable/Uncomplicated    Rehab Potential Good    PT Frequency 2x / week    PT Duration 8 weeks    PT Treatment/Interventions Cryotherapy;Electrical Stimulation;Moist Heat;Gait training;Stair training;Functional mobility training;Therapeutic activities;Therapeutic exercise;Neuromuscular re-education;Balance training;Patient/family education;Orthotic Fit/Training;Energy conservation    PT Next Visit Plan work on LE strengthening and balance; gait training, endurance    PT Home Exercise Plan Access Code: [email or web address]; no updates             Patient will benefit from skilled therapeutic intervention in order to improve the following deficits and impairments:  Abnormal gait, Decreased balance, Decreased endurance, Decreased mobility, Difficulty walking, Cardiopulmonary status limiting activity, Decreased activity tolerance, Decreased coordination, Decreased strength  Visit Diagnosis: Muscle weakness (generalized)  Unsteadiness on feet  Other abnormalities of gait and mobility     Problem List Patient Active Problem List   Diagnosis Date Noted   Carotid stenosis, symptomatic, with infarction ([place]) 11/13/2020   Gout flare 11/10/2020   UTI (urinary tract infection) 11/10/2020   Left carotid artery stenosis 11/10/2020   Chronic kidney disease 11/10/2020   Left basal ganglia embolic stroke ([place]) 42/70/6237   PAC (premature atrial contraction)    Pre-procedural cardiovascular examination    Ischemic stroke ([place]) 10/20/2020   TIA (transient ischemic attack) 10/19/2020   Right hemiparesis ([place]) 10/19/2020   Type 2 diabetes  mellitus with stage 3 chronic kidney disease, without long-term current use of insulin ([place]) 08/24/2018   Hyperlipidemia, unspecified 07/19/2017   Hypertension 07/19/2017   PUD (peptic ulcer disease) 07/19/2017   Type 2 diabetes mellitus ([place]) 07/19/2017   Personal history of gout 08/12/2016   Anemia, unspecified 04/09/2016   Hypothyroidism, acquired 12/10/2015    Zollie Pee, PT 01/28/2021, 3:52 PM  [place] MAIN [facility] SERVICES [address] [place], [place], [address] Phone: [phone]   Fax:  [phone]  Name: Rhyse Skowron MRN: [id number] Date of Birth: 10/29/1928

## 2021-02-02 ENCOUNTER — Encounter: Payer: Medicare HMO | Admitting: Speech Pathology

## 2021-02-02 ENCOUNTER — Other Ambulatory Visit: Payer: Self-pay

## 2021-02-02 ENCOUNTER — Ambulatory Visit: Payer: Medicare HMO

## 2021-02-02 DIAGNOSIS — R278 Other lack of coordination: Secondary | ICD-10-CM

## 2021-02-02 DIAGNOSIS — M6281 Muscle weakness (generalized): Secondary | ICD-10-CM

## 2021-02-02 DIAGNOSIS — R2689 Other abnormalities of gait and mobility: Secondary | ICD-10-CM

## 2021-02-02 DIAGNOSIS — R2681 Unsteadiness on feet: Secondary | ICD-10-CM

## 2021-02-02 DIAGNOSIS — I639 Cerebral infarction, unspecified: Secondary | ICD-10-CM

## 2021-02-02 NOTE — Therapy (Signed)
[place] MAIN [facility] SERVICES [address] [place], [place], [address] Phone: [phone]   Fax:  [phone]  Physical Therapy Treatment/RECERT  Patient Details  Name: Cassandra Schaefer MRN: [id number] Date of Birth: 1928/08/31 Referring Provider (PT): Dr. Dew/Dr. Ginette Pitman PCP   Encounter Date: 02/02/2021   PT End of Session - 02/02/21 1604     Visit Number 14    Number of Visits 38    Date for PT Re-Evaluation 04/27/21    Authorization Type [employer]    Authorization Time Period 12/02/20-01/28/21    PT Start Time 1519    PT Stop Time 1559    PT Time Calculation (min) 40 min    Equipment Utilized During Treatment Gait belt    Activity Tolerance Patient tolerated treatment well    Behavior During Therapy WFL for tasks assessed/performed             Past Medical History:  Diagnosis Date   Anemia    Cough    RESOLVING, NO FEVER   Diabetes mellitus without complication ([place])    Gout    Hypertension    Hypothyroidism    PUD (peptic ulcer disease)    Stroke [facility])    Wears dentures    full upper, partial lower    Past Surgical History:  Procedure Laterality Date   AMPUTATION TOE Left 12/14/2017   Procedure: AMPUTATION TOE-4TH MPJ;  Surgeon: Samara Deist, DPM;  Location: [place];  Service: Podiatry;  Laterality: Left;  IVA LOCAL Diabetic - oral meds   APPENDECTOMY     CATARACT EXTRACTION W/PHACO Right 06/23/2015   Procedure: CATARACT EXTRACTION PHACO AND INTRAOCULAR LENS PLACEMENT (IOC);  Surgeon: Estill Cotta, MD;  Location: [facility];  Service: Ophthalmology;  Laterality: Right;  [place] 01:28 AP% 23.4 CDE 36.14 fluid pack lot # [id number]   CATARACT EXTRACTION W/PHACO Left 07/28/2015   Procedure: CATARACT EXTRACTION PHACO AND INTRAOCULAR LENS PLACEMENT (IOC);  Surgeon: Estill Cotta, MD;  Location: [facility];  Service: Ophthalmology;  Laterality: Left;  [place]    1:29.7 AP%  24.9 CDE   41.32 fluid casette lot  #[id number] H exp 09/23/2016   COONOSCOPY     AND ENDOSCOPY   DILATION AND CURETTAGE OF UTERUS     ENDARTERECTOMY Left 11/13/2020   Procedure: Evacuation of left neck hematoma;  Surgeon: Katha Cabal, MD;  Location: [facility];  Service: Vascular;  Laterality: Left;   ENDARTERECTOMY Left 11/13/2020   Procedure: ENDARTERECTOMY CAROTID;  Surgeon: Algernon Huxley, MD;  Location: [facility];  Service: Vascular;  Laterality: Left;   TONSILLECTOMY     TUBAL LIGATION      There were no vitals filed for this visit.   Subjective Assessment - 02/02/21 1703     Subjective Pt had a good weekend with family visiting. She reports she was not able to do much of her HEP.    Patient is accompained by: Family member   Daughter, [name]   Pertinent History 87 y.o. female with medical history significant for type 2 diabetes mellitus, essential hypertension, acquired hypothyroidism, hyperlipidemia, stage III chronic kidney disease reports increased right sided weakness on 10/19/20. She was diagnosed with left basal ganglia ischemic stroke. She was discharged to inpatient rehab for 10 days and then discharged home. Patient was evaluated by outpatient PT on 11/12/20. CVA was due to carotid stenosis. She underwent left carotid endartectomy on 7/21. Procedure went well however patient suffered from left cervical hematoma and had subsequent  surgical procedure to stop the bleed and remove the hematoma on 11/13/20. They were concerned with her breathing and therefore intubated her for 1 day, she was in ICU for 2 days and transferred to med/surge on 11/15/20. Patient was discharged home on 11/17/20 and is now returning to outpatient PT. She has had the stiches removed and is healing well. Denies any neck pain or stiffness. She lives with her daughter and has 24/7 caregiver support. She is still using RW for most ambulation. She is able to transfer to bedside commode well and is able to stand short time unsupported. She reports feeling very  weak and fatigued. She reports she has been dragging her right foot more. She denies any numbness/tingling; She reports feeling like her legs are wanting to do more but is hesitant because of fear of falling. She is mod I for self care morning routine. She does still receive help with showering. She has been working on increasing her activity but denies any formal exercise.    Limitations Standing;Walking    How long can you sit comfortably? NA    How long can you stand comfortably? 30-60 min with support    How long can you walk comfortably? about 100 feet with RW, still limited to short distances;    Diagnostic tests MRI shows left basal ganglia infarct 10/21/20    Patient Stated Goals "Improve balance and walking and not need RW, get back to independent."    Currently in Pain? No/denies    Pain Onset In the past 7 days             Interventions   STS with CGA 3x6, hand-free, close CGA  Rates medium  FOTO 57 (slight decrease from 59)  At support bar with contact-guard assist: Standing hip abduction 3x8 each LE with 3# weights on each LE Standing marches with 3# AW on each LE 2x15 LAQ with 3# weights on each LE - 3x12; medium Standing heel raises with UE support on bar - 20x   Seated hamstring stretch with calf stretch on chair 2x30 sec each LE --30 sec with modified version for pt to perform with heel on floor and leaning forward.   Gait- Ambulation for cardiorespiratory and LE mm endurance training - 3x148 ft with 4WW, CGA throughout. Pt continues to drag RLE with fatigue. Continued cuing for knee ext/heel strike. Pt then ambulates pushing transport chair to OT gym approx 40 ft.     Pt educated throughout session about proper posture and technique with exercises. Improved exercise technique, movement at target joints, use of target muscles after min to mod verbal, visual, tactile cues.     PT Education - 02/02/21 1704     Education Details exercise technique, body mechanics     Person(s) Educated Patient    Methods Explanation;Demonstration;Tactile cues;Verbal cues    Comprehension Verbalized understanding;Returned demonstration;Need further instruction              PT Short Term Goals - 02/02/21 1606       PT SHORT TERM GOAL #1   Title Patient will be adherent to HEP at least 3x a week to improve functional strength and balance for better safety at home.    Baseline 9/21: Pt reports compliance with HEP and is confident    Time 4    Period Weeks    Status Achieved    Target Date 12/31/20      PT SHORT TERM GOAL #2   Title Patient will  be independent in transferring sit<>Stand without pushing on arm rests to improve ability to get up from chair.    Baseline 9/21: pt able to complete STS without use of UEs    Time 4    Period Weeks    Status Achieved    Target Date 12/31/20               PT Long Term Goals - 02/02/21 1607       PT LONG TERM GOAL #1   Title Patient (> 34 years old) will complete five times sit to stand test in < 15 seconds indicating an increased LE strength and improved balance.    Baseline 9/21: 29 seconds hands-free 10/10: deferred    Time 8    Period Weeks    Status On-going    Target Date 04/27/21      PT LONG TERM GOAL #2   Title Patient will increase six minute walk test distance to >400 feet for improve gait ability    Baseline 9/21: 368 ft with RW; 10/10: deferred    Time 8    Period Weeks    Status Partially Met    Target Date 04/27/21      PT LONG TERM GOAL #3   Title Patient will increase 10 meter walk test to >1.11ms as to improve gait speed for better community ambulation and to reduce fall risk.    Baseline 9/21: 0.5 m/s with RW; 10/10 deferred    Time 8    Period Weeks    Status On-going    Target Date 04/27/21      PT LONG TERM GOAL #4   Title Patient will be independent with ascend/descend 6 steps using single UE in step over step pattern without LOB.    Baseline 9/21:  Patient uses PT  stairs (4 steps) with bilateral upper extremity support on handrails for both ascending/descending.  Patient exhibits reciprocal pattern ascending and step to pattern descending. 10/10: deferred    Time 8    Period Weeks    Status On-going    Target Date 04/27/21      PT LONG TERM GOAL #5   Title Patient will demonstrate an improved Berg Balance Score of >45/56 as to demonstrate improved balance with ADLs such as sitting/standing and transfer balance and reduced fall risk.    Baseline 9/21: deferred d/t time; 9/29: 42/56; 10/10: deferred    Time 8    Period Weeks    Status On-going    Target Date 04/27/21      PT LONG TERM GOAL #6   Title Patient will improve FOTO score to >60% to indicate improved functional mobility with ADLs.    Baseline 9/21: 59; 10/10: 57    Time 8    Period Weeks    Status Partially Met    Target Date 04/27/21                   Plan - 02/02/21 1605     Clinical Impression Statement FOTO reassessed for recertification, other goal assessment deferred to next session. Pt FOTO today was 572(previously 51, indicating similar, but slightly decreased perceived functional mobility and QOL compared to prior assessment. Overall, pt shows progress with increasing volume and level of resistance used with therex, indicating increased activity tolerance and LE strength. Pt also able to perform multiple consecutive reps, and multiple sets of STS hands-free. At beginning of reporting period pt was unable to do this. This indicates  increased BLE power and ease with transfers. While pt shows improvement, RLE still fatigues quickly with gait with increased foot drag. Pt aware and attempts to correct, but has difficulty d/t decreased LE endurance. Patient's condition has the potential to improve in response to therapy. Maximum improvement is yet to be obtained. The anticipated improvement is attainable and reasonable in a generally predictable time.  Patient reports she has  noticed improvement in ease with performing transfers hands-free. The pt will benefit from further skilled PT to continue to improve BLE strength, endurance, balance and gait to decrease fall risk and increase QOL.    Personal Factors and Comorbidities Age;Comorbidity 3+;Fitness    Comorbidities HTN, CKD, fall risk, type 2 diabetes,    Examination-Activity Limitations Lift;Locomotion Level;Squat;Stairs;Stand;Toileting;Transfers;Bathing    Examination-Participation Restrictions Cleaning;Community Activity;Laundry;Meal Prep;Shop;Volunteer;Driving    Stability/Clinical Decision Making Stable/Uncomplicated    Rehab Potential Good    PT Frequency 2x / week    PT Duration 12 weeks    PT Treatment/Interventions Cryotherapy;Electrical Stimulation;Moist Heat;Gait training;Stair training;Functional mobility training;Therapeutic activities;Therapeutic exercise;Neuromuscular re-education;Balance training;Patient/family education;Orthotic Fit/Training;Energy conservation    PT Next Visit Plan work on LE strengthening and balance; gait training, endurance    PT Home Exercise Plan Access Code: [email or web address]; no updates    Consulted and Agree with Plan of Care Patient             Patient will benefit from skilled therapeutic intervention in order to improve the following deficits and impairments:  Abnormal gait, Decreased balance, Decreased endurance, Decreased mobility, Difficulty walking, Cardiopulmonary status limiting activity, Decreased activity tolerance, Decreased coordination, Decreased strength  Visit Diagnosis: Muscle weakness (generalized)  Other abnormalities of gait and mobility  Unsteadiness on feet  Other lack of coordination     Problem List Patient Active Problem List   Diagnosis Date Noted   Carotid stenosis, symptomatic, with infarction ([place]) 11/13/2020   Gout flare 11/10/2020   UTI (urinary tract infection) 11/10/2020   Left carotid artery stenosis 11/10/2020   Chronic kidney  disease 11/10/2020   Left basal ganglia embolic stroke ([place]) 70/26/3785   PAC (premature atrial contraction)    Pre-procedural cardiovascular examination    Ischemic stroke ([place]) 10/20/2020   TIA (transient ischemic attack) 10/19/2020   Right hemiparesis ([place]) 10/19/2020   Type 2 diabetes mellitus with stage 3 chronic kidney disease, without long-term current use of insulin ([place]) 08/24/2018   Hyperlipidemia, unspecified 07/19/2017   Hypertension 07/19/2017   PUD (peptic ulcer disease) 07/19/2017   Type 2 diabetes mellitus ([place]) 07/19/2017   Personal history of gout 08/12/2016   Anemia, unspecified 04/09/2016   Hypothyroidism, acquired 12/10/2015    Zollie Pee, PT 02/02/2021, 5:09 PM  [place] MAIN [facility] SERVICES [address] [place], [place], [address] Phone: [phone]   Fax:  [phone]  Name: Cassandra Schaefer MRN: [id number] Date of Birth: 1929/03/22

## 2021-02-02 NOTE — Therapy (Signed)
[facility] MAIN [facility] SERVICES [address] [place], [place], [address] Phone: [phone]   Fax:  [phone]  Occupational Therapy Treatment  Patient Details  Name: Cassandra Schaefer MRN: [id number] Date of Birth: 1929-01-01 Referring Provider (OT): Dr. Barbette Reichmann   Encounter Date: 02/02/2021   OT End of Session - 02/02/21 1713     Visit Number 17    Number of Visits 40    Date for OT Re-Evaluation 04/21/21    Authorization Time Period Reporting period starting 01/07/2021    OT Start Time 1600    OT Stop Time 1643    OT Time Calculation (min) 43 min    Equipment Utilized During Treatment tranport chair    Activity Tolerance Patient tolerated treatment well    Behavior During Therapy WFL for tasks assessed/performed             Past Medical History:  Diagnosis Date   Anemia    Cough    RESOLVING, NO FEVER   Diabetes mellitus without complication ([facility])    Gout    Hypertension    Hypothyroidism    PUD (peptic ulcer disease)    Stroke [facility])    Wears dentures    full upper, partial lower    Past Surgical History:  Procedure Laterality Date   AMPUTATION TOE Left 12/14/2017   Procedure: AMPUTATION TOE-4TH MPJ;  Surgeon: Gwyneth Revels, DPM;  Location: [facility];  Service: Podiatry;  Laterality: Left;  IVA LOCAL Diabetic - oral meds   APPENDECTOMY     CATARACT EXTRACTION W/PHACO Right 06/23/2015   Procedure: CATARACT EXTRACTION PHACO AND INTRAOCULAR LENS PLACEMENT (IOC);  Surgeon: Sallee Lange, MD;  Location: [facility];  Service: Ophthalmology;  Laterality: Right;  [place] 01:28 AP% 23.4 CDE 36.14 fluid pack lot # [id number]   CATARACT EXTRACTION W/PHACO Left 07/28/2015   Procedure: CATARACT EXTRACTION PHACO AND INTRAOCULAR LENS PLACEMENT (IOC);  Surgeon: Sallee Lange, MD;  Location: [facility];  Service: Ophthalmology;  Laterality: Left;  [place]    1:29.7 AP%  24.9 CDE   41.32 fluid casette lot #[id number] H exp  09/23/2016   COONOSCOPY     AND ENDOSCOPY   DILATION AND CURETTAGE OF UTERUS     ENDARTERECTOMY Left 11/13/2020   Procedure: Evacuation of left neck hematoma;  Surgeon: Renford Dills, MD;  Location: [facility];  Service: Vascular;  Laterality: Left;   ENDARTERECTOMY Left 11/13/2020   Procedure: ENDARTERECTOMY CAROTID;  Surgeon: Annice Needy, MD;  Location: [facility];  Service: Vascular;  Laterality: Left;   TONSILLECTOMY     TUBAL LIGATION      There were no vitals filed for this visit.   Subjective Assessment - 02/02/21 1711     Subjective  "I broke a glass this morning when I was reaching for my toothbrush."    Patient is accompanied by: Family member    Pertinent History R ankle pain, gout, T2DM, HTN, CKDIII; R/L carotid endarectomy    Patient Stated Goals "I want to improve my aim when I'm using my R arm."    Currently in Pain? No/denies    Pain Score 0-No pain    Pain Onset In the past 7 days           Occupational Therapy Treatment: Neuro re-ed: Focus today on FMC/GMC reaching towards a target.  Picked up small pegs to place on pegboard placed on elevated/inclined surface, and then cued pt to remove every other peg, working to  avoid touching the peg to either side to target reaching with precision.  Placed jumbo pegs vertically on table top and pt worked to remove small stones within the group of pegs without knocking pegs over, placed stones atop pegs and worked to remove stones without tipping over pegs.  Pt able to complete with ~ 50% accuracy.  Reviewed other targeted reaching activities for pt to complete at home; pt verbalized understanding and plans to add to her HEP.   Response to Treatment: Pt reports that she broke a glass this morning when she knocked it over while reaching for her toothbrush.  Daughter has since moved her toothbrush to another location, but pt was eager to address her "aim" this day.  Pt approximately 50% accurate with reaching activities today,  easily knocking over objects on table top when reaching between obstacles.  Pt was pleased with new activities today to target her aim and verbalizes understanding of activities which will carry over at home.  Pt will continue to work towards progression of FMC/GMC activities in order to improve control throughout RUE and improve manipulation of ADL supplies.     OT Education - 02/02/21 1712     Education Details GMC activities to target reaching with RUE    Person(s) Educated Patient    Methods Explanation;Verbal cues;Demonstration    Comprehension Verbalized understanding;Returned demonstration              OT Short Term Goals - 01/28/21 1644       OT SHORT TERM GOAL #1   Title Pt will perform HEP for RUE strength/coordination independently.    Baseline Eval: HEP not yet established in outpatient, but pt is working with putty from [name]; 12/04/2020; [facility] HEP initiated this day with mod vc after return demo; 01/05/2021: indep with currently program, but ongoing as pt progresses; 01/28/2021: vc to revisit and focus on grip and pinch strengthening with putty    Time 6    Period Weeks    Status On-going    Target Date 03/10/21               OT Long Term Goals - 01/28/21 1712       OT LONG TERM GOAL #1   Title Pt will improve hand writing to 100% legibility with R hand to be able to independently write a check.    Baseline Eval: signature is 75% legible with R hand, requiring extra time; 12/04/2020: no change from eval; 01/05/2021: Printing is 100% legible, signature is 90% legible, but still requires extra time and effort.; 01/26/21: Signature is 90% legible and speed/fluidity have improved    Time 12    Period Weeks    Status On-going    Target Date 04/21/21      OT LONG TERM GOAL #2   Title Pt will improve R hand coordination to enable good manipulation of iphone using R dominant hand    Baseline Eval: Pt uses L hand d/t R hand lacking sufficient FMC to press buttons on phone;  12/04/2020: no change from eval; 01/05/2021: R hand coordination is improving but pt still uses L hand to dial phone; 01/28/21: Pt using R dominant hand to press buttons and apps on phone 50% of the time.    Time 12    Period Weeks    Status On-going    Target Date 04/21/21      OT LONG TERM GOAL #3   Title Pt will improve GMC throughout RUE to enable pt  to reach for and pick up ADL supplies with R dominant hand without dropping or knocking over objects.    Baseline Eval: Pt reports RUE is clumsy and easily knocks over objects when trying to reach for ADL supplies.  Pt verbalizes that her "aim is off."; 12/04/2020: pt reports slight improvement since eval and has started using her R hand to eat, but still feels clumsy; 01/05/2021: Greatly improved; pt is using silverware to eat with R hand, can comb hair with RUE, still mildly ataxic; 01/26/21: pt reports difficulty picking up objects within a small space (ie; may knock the toothpaste holder down when reaching for toothbrush), but reaching for light/larger objects is improving    Time 12    Period Weeks    Status On-going    Target Date 04/21/21      OT LONG TERM GOAL #4   Title Pt will improve FOTO score to 68 or better to indicate measurable functional improvement.    Baseline Eval: FOTO 55; 01/05/2021: FOTO 61; 01/28/21: FOTO 61    Time 12    Period Weeks    Status On-going    Target Date 04/21/21      OT LONG TERM GOAL #5   Title Pt will perform dynamic standing tasks with modified indep, AD as needed in order to reduce fall risk with ADLs    Baseline Eval: CGA-min A for all dynamic standing tasks using RW; 12/04/2020: pt is using RW in home with distant supv, but family still provides close supv on stairs; 01/05/2021: Pt now using RW to amb in and around the house, can perform ADLs with RW and modified indep, and can manage stairs at home with modified indep.  No falls reported.    Time 12    Period Weeks    Status Achieved    Target Date  02/01/21                   Plan - 02/02/21 1723     Clinical Impression Statement Pt reports that she broke a glass this morning when she knocked it over while reaching for her toothbrush.  Daughter has since moved her toothbrush to another location, but pt was eager to address her "aim" this day.  Pt approximately 50% accurate with reaching activities today, easily knocking over objects on table top when reaching between obstacles.  Pt was pleased with new activities today to target her aim and verbalizes understanding of activities which will carry over at home.  Pt will continue to work towards progression of FMC/GMC activities in order to improve control throughout RUE and improve manipulation of ADL supplies.    OT Occupational Profile and History Detailed Assessment- Review of Records and additional review of physical, cognitive, psychosocial history related to current functional performance    Occupational performance deficits (Please refer to evaluation for details): ADL's;Leisure;IADL's    Body Structure / Function / Physical Skills ADL;Coordination;Endurance;GMC;UE functional use;Balance;Sensation;Body mechanics;IADL;Pain;Dexterity;FMC;Strength;Gait;Mobility    Rehab Potential Good    Clinical Decision Making Several treatment options, min-mod task modification necessary    Comorbidities Affecting Occupational Performance: May have comorbidities impacting occupational performance    Modification or Assistance to Complete Evaluation  Min-Moderate modification of tasks or assist with assess necessary to complete eval    OT Frequency 2x / week    OT Duration 12 weeks    OT Treatment/Interventions Self-care/ADL training;Therapeutic exercise;DME and/or AE instruction;Functional Mobility Training;Balance training;Neuromuscular education;Therapeutic activities;Patient/family education    Consulted and Agree with  Plan of Care Patient             Patient will benefit from skilled  therapeutic intervention in order to improve the following deficits and impairments:   Body Structure / Function / Physical Skills: ADL, Coordination, Endurance, GMC, UE functional use, Balance, Sensation, Body mechanics, IADL, Pain, Dexterity, FMC, Strength, Gait, Mobility       Visit Diagnosis: Muscle weakness (generalized)  Other lack of coordination  Left basal ganglia embolic stroke [facility])    Problem List Patient Active Problem List   Diagnosis Date Noted   Carotid stenosis, symptomatic, with infarction (HCC) 11/13/2020   Gout flare 11/10/2020   UTI (urinary tract infection) 11/10/2020   Left carotid artery stenosis 11/10/2020   Chronic kidney disease 11/10/2020   Left basal ganglia embolic stroke (HCC) 10/24/2020   PAC (premature atrial contraction)    Pre-procedural cardiovascular examination    Ischemic stroke (HCC) 10/20/2020   TIA (transient ischemic attack) 10/19/2020   Right hemiparesis (HCC) 10/19/2020   Type 2 diabetes mellitus with stage 3 chronic kidney disease, without long-term current use of insulin (HCC) 08/24/2018   Hyperlipidemia, unspecified 07/19/2017   Hypertension 07/19/2017   PUD (peptic ulcer disease) 07/19/2017   Type 2 diabetes mellitus (HCC) 07/19/2017   Personal history of gout 08/12/2016   Anemia, unspecified 04/09/2016   Hypothyroidism, acquired 12/10/2015   Danelle Earthly, MS, OTR/L  Otis Dials, OT/L 02/02/2021, 5:24 PM  [place] [facility] MAIN [facility] SERVICES [address] [place], [place], [address] Phone: [phone]   Fax:  [phone]  Name: Cassandra Schaefer MRN: [id number] Date of Birth: 22-Jul-1928

## 2021-02-04 ENCOUNTER — Ambulatory Visit: Payer: Medicare HMO

## 2021-02-04 ENCOUNTER — Encounter: Payer: Medicare HMO | Admitting: Speech Pathology

## 2021-02-04 ENCOUNTER — Other Ambulatory Visit: Payer: Self-pay

## 2021-02-04 ENCOUNTER — Ambulatory Visit: Payer: Medicare HMO | Admitting: Occupational Therapy

## 2021-02-04 DIAGNOSIS — R278 Other lack of coordination: Secondary | ICD-10-CM

## 2021-02-04 DIAGNOSIS — R2681 Unsteadiness on feet: Secondary | ICD-10-CM

## 2021-02-04 DIAGNOSIS — R2689 Other abnormalities of gait and mobility: Secondary | ICD-10-CM

## 2021-02-04 DIAGNOSIS — R482 Apraxia: Secondary | ICD-10-CM

## 2021-02-04 DIAGNOSIS — M6281 Muscle weakness (generalized): Secondary | ICD-10-CM

## 2021-02-04 NOTE — Therapy (Signed)
[facility] MAIN [facility] SERVICES [address] [place], [place], [address] Phone: [phone]   Fax:  [phone]  Occupational Therapy Treatment  Patient Details  Name: Cassandra Schaefer MRN: [id number] Date of Birth: August 05, 1928 Referring Provider (OT): Dr. Barbette Reichmann   Encounter Date: 02/04/2021   OT End of Session - 02/04/21 1348     Visit Number 18    Number of Visits 40    Date for OT Re-Evaluation 04/21/21    Authorization Time Period Reporting period starting 01/07/2021    OT Start Time 1345    Equipment Utilized During Treatment tranport chair    Activity Tolerance Patient tolerated treatment well    Behavior During Therapy WFL for tasks assessed/performed             Past Medical History:  Diagnosis Date   Anemia    Cough    RESOLVING, NO FEVER   Diabetes mellitus without complication (HCC)    Gout    Hypertension    Hypothyroidism    PUD (peptic ulcer disease)    Stroke [facility])    Wears dentures    full upper, partial lower    Past Surgical History:  Procedure Laterality Date   AMPUTATION TOE Left 12/14/2017   Procedure: AMPUTATION TOE-4TH MPJ;  Surgeon: Gwyneth Revels, DPM;  Location: [facility];  Service: Podiatry;  Laterality: Left;  IVA LOCAL Diabetic - oral meds   APPENDECTOMY     CATARACT EXTRACTION W/PHACO Right 06/23/2015   Procedure: CATARACT EXTRACTION PHACO AND INTRAOCULAR LENS PLACEMENT (IOC);  Surgeon: Sallee Lange, MD;  Location: [facility];  Service: Ophthalmology;  Laterality: Right;  [place] 01:28 AP% 23.4 CDE 36.14 fluid pack lot # [id number]   CATARACT EXTRACTION W/PHACO Left 07/28/2015   Procedure: CATARACT EXTRACTION PHACO AND INTRAOCULAR LENS PLACEMENT (IOC);  Surgeon: Sallee Lange, MD;  Location: [facility];  Service: Ophthalmology;  Laterality: Left;  [place]    1:29.7 AP%  24.9 CDE   41.32 fluid casette lot #[id number] H exp 09/23/2016   COONOSCOPY     AND ENDOSCOPY   DILATION AND  CURETTAGE OF UTERUS     ENDARTERECTOMY Left 11/13/2020   Procedure: Evacuation of left neck hematoma;  Surgeon: Renford Dills, MD;  Location: [facility];  Service: Vascular;  Laterality: Left;   ENDARTERECTOMY Left 11/13/2020   Procedure: ENDARTERECTOMY CAROTID;  Surgeon: Annice Needy, MD;  Location: [facility];  Service: Vascular;  Laterality: Left;   TONSILLECTOMY     TUBAL LIGATION      There were no vitals filed for this visit.   Subjective Assessment - 02/04/21 1347     Subjective  Pt reports she has been enjoying the weather.    Pertinent History R ankle pain, gout, T2DM, HTN, CKDIII; R/L carotid endarectomy    Patient Stated Goals "I want to improve my aim when I'm using my R arm."    Currently in Pain? No/denies    Pain Onset In the past 7 days              Therapeutic Activity  Pt continued to work on reaching towards a target.  Picked up small pegs to place on and remove from flat tabletop working to avoid touching the peg to either side to target reaching with precision.  Pt worked to place and remove small stones on top of and between the group of pegs without knocking pegs over. Pt able to complete with ~ 80% accuracy, imporved from  prior session. Pt focused on dominant RUE 3pt pinch and tip to tip pinch with manipulating 1 inch ball knob on peg. Pt required increased time to complete and cues for WBing rest breaks. Increased difficulty noted when instructed to perform palm to fingertip translation of knobbed pegs.  Self Care Pt worked on writing legibility and speed using gel pel. Pt was able to write for ~42min with 1 rest break - required LUE assist to setup grasp on pen and adjust grasp t/o.  Fair letter spacing, good line spacing, and 80% legibility (printed and cursive) - reports decreased legibility using gel pen.       OT Education - 02/04/21 1348     Education Details GMC activities to target reaching with RUE    Person(s) Educated Patient    Methods  Explanation;Verbal cues;Demonstration    Comprehension Verbalized understanding;Returned demonstration              OT Short Term Goals - 01/28/21 1644       OT SHORT TERM GOAL #1   Title Pt will perform HEP for RUE strength/coordination independently.    Baseline Eval: HEP not yet established in outpatient, but pt is working with putty from [name]; 12/04/2020; [facility] HEP initiated this day with mod vc after return demo; 01/05/2021: indep with currently program, but ongoing as pt progresses; 01/28/2021: vc to revisit and focus on grip and pinch strengthening with putty    Time 6    Period Weeks    Status On-going    Target Date 03/10/21               OT Long Term Goals - 01/28/21 1712       OT LONG TERM GOAL #1   Title Pt will improve hand writing to 100% legibility with R hand to be able to independently write a check.    Baseline Eval: signature is 75% legible with R hand, requiring extra time; 12/04/2020: no change from eval; 01/05/2021: Printing is 100% legible, signature is 90% legible, but still requires extra time and effort.; 01/26/21: Signature is 90% legible and speed/fluidity have improved    Time 12    Period Weeks    Status On-going    Target Date 04/21/21      OT LONG TERM GOAL #2   Title Pt will improve R hand coordination to enable good manipulation of iphone using R dominant hand    Baseline Eval: Pt uses L hand d/t R hand lacking sufficient FMC to press buttons on phone; 12/04/2020: no change from eval; 01/05/2021: R hand coordination is improving but pt still uses L hand to dial phone; 01/28/21: Pt using R dominant hand to press buttons and apps on phone 50% of the time.    Time 12    Period Weeks    Status On-going    Target Date 04/21/21      OT LONG TERM GOAL #3   Title Pt will improve GMC throughout RUE to enable pt to reach for and pick up ADL supplies with R dominant hand without dropping or knocking over objects.    Baseline Eval: Pt reports RUE is clumsy  and easily knocks over objects when trying to reach for ADL supplies.  Pt verbalizes that her "aim is off."; 12/04/2020: pt reports slight improvement since eval and has started using her R hand to eat, but still feels clumsy; 01/05/2021: Greatly improved; pt is using silverware to eat with R hand, can comb hair with  RUE, still mildly ataxic; 01/26/21: pt reports difficulty picking up objects within a small space (ie; may knock the toothpaste holder down when reaching for toothbrush), but reaching for light/larger objects is improving    Time 12    Period Weeks    Status On-going    Target Date 04/21/21      OT LONG TERM GOAL #4   Title Pt will improve FOTO score to 68 or better to indicate measurable functional improvement.    Baseline Eval: FOTO 55; 01/05/2021: FOTO 61; 01/28/21: FOTO 61    Time 12    Period Weeks    Status On-going    Target Date 04/21/21      OT LONG TERM GOAL #5   Title Pt will perform dynamic standing tasks with modified indep, AD as needed in order to reduce fall risk with ADLs    Baseline Eval: CGA-min A for all dynamic standing tasks using RW; 12/04/2020: pt is using RW in home with distant supv, but family still provides close supv on stairs; 01/05/2021: Pt now using RW to amb in and around the house, can perform ADLs with RW and modified indep, and can manage stairs at home with modified indep.  No falls reported.    Time 12    Period Weeks    Status Achieved    Target Date 02/01/21                   Plan - 02/04/21 1348     Clinical Impression Statement Pt continues to be eager to address her "aim" this day.  Pt imporved to ~80% accuracy with reaching activities today. Pt demonstrated decreased writing legibility using gel wtriting pen, however good endurance (completed 5 min writing with 1 rest break). Pt will continue to work towards progression of FMC/GMC activities in order to improve control throughout RUE and improve manipulation of ADL supplies.    OT  Occupational Profile and History Detailed Assessment- Review of Records and additional review of physical, cognitive, psychosocial history related to current functional performance    Occupational performance deficits (Please refer to evaluation for details): ADL's;Leisure;IADL's    Body Structure / Function / Physical Skills ADL;Coordination;Endurance;GMC;UE functional use;Balance;Sensation;Body mechanics;IADL;Pain;Dexterity;FMC;Strength;Gait;Mobility    Rehab Potential Good    Clinical Decision Making Several treatment options, min-mod task modification necessary    Comorbidities Affecting Occupational Performance: May have comorbidities impacting occupational performance    Modification or Assistance to Complete Evaluation  Min-Moderate modification of tasks or assist with assess necessary to complete eval    OT Frequency 2x / week    OT Duration 12 weeks    OT Treatment/Interventions Self-care/ADL training;Therapeutic exercise;DME and/or AE instruction;Functional Mobility Training;Balance training;Neuromuscular education;Therapeutic activities;Patient/family education    Consulted and Agree with Plan of Care Patient             Patient will benefit from skilled therapeutic intervention in order to improve the following deficits and impairments:   Body Structure / Function / Physical Skills: ADL, Coordination, Endurance, GMC, UE functional use, Balance, Sensation, Body mechanics, IADL, Pain, Dexterity, FMC, Strength, Gait, Mobility       Visit Diagnosis: Muscle weakness (generalized)  Other lack of coordination  Apraxia    Problem List Patient Active Problem List   Diagnosis Date Noted   Carotid stenosis, symptomatic, with infarction (HCC) 11/13/2020   Gout flare 11/10/2020   UTI (urinary tract infection) 11/10/2020   Left carotid artery stenosis 11/10/2020   Chronic kidney disease 11/10/2020  Left basal ganglia embolic stroke (HCC) 10/24/2020   PAC (premature atrial  contraction)    Pre-procedural cardiovascular examination    Ischemic stroke (HCC) 10/20/2020   TIA (transient ischemic attack) 10/19/2020   Right hemiparesis (HCC) 10/19/2020   Type 2 diabetes mellitus with stage 3 chronic kidney disease, without long-term current use of insulin (HCC) 08/24/2018   Hyperlipidemia, unspecified 07/19/2017   Hypertension 07/19/2017   PUD (peptic ulcer disease) 07/19/2017   Type 2 diabetes mellitus (HCC) 07/19/2017   Personal history of gout 08/12/2016   Anemia, unspecified 04/09/2016   Hypothyroidism, acquired 12/10/2015     Kathie Dike, M.S. OTR/L  02/04/21, 2:30 PM  ascom [phone]    [facility] MAIN [facility] SERVICES [address] [place], [place], [address] Phone: [phone]   Fax:  [phone]  Name: Trenee Igoe MRN: [id number] Date of Birth: February 13, 1929

## 2021-02-04 NOTE — Therapy (Signed)
[place] MAIN [facility] SERVICES [address] [place], [place], [address] Phone: [phone]   Fax:  [phone]  Physical Therapy Treatment  Patient Details  Name: Cassandra Schaefer MRN: [id number] Date of Birth: 23-Jun-1928 Referring Provider (PT): Dr. Dew/Dr. Ginette Pitman PCP   Encounter Date: 02/04/2021   PT End of Session - 02/04/21 1616     Visit Number 15    Number of Visits 38    Date for PT Re-Evaluation 04/27/21    Authorization Type [employer]    Authorization Time Period 12/02/20-01/28/21    PT Start Time 1302    PT Stop Time 1344    PT Time Calculation (min) 42 min    Equipment Utilized During Treatment Gait belt    Activity Tolerance Patient tolerated treatment well    Behavior During Therapy WFL for tasks assessed/performed             Past Medical History:  Diagnosis Date   Anemia    Cough    RESOLVING, NO FEVER   Diabetes mellitus without complication ([place])    Gout    Hypertension    Hypothyroidism    PUD (peptic ulcer disease)    Stroke [facility])    Wears dentures    full upper, partial lower    Past Surgical History:  Procedure Laterality Date   AMPUTATION TOE Left 12/14/2017   Procedure: AMPUTATION TOE-4TH MPJ;  Surgeon: Samara Deist, DPM;  Location: [place];  Service: Podiatry;  Laterality: Left;  IVA LOCAL Diabetic - oral meds   APPENDECTOMY     CATARACT EXTRACTION W/PHACO Right 06/23/2015   Procedure: CATARACT EXTRACTION PHACO AND INTRAOCULAR LENS PLACEMENT (IOC);  Surgeon: Estill Cotta, MD;  Location: [facility];  Service: Ophthalmology;  Laterality: Right;  [place] 01:28 AP% 23.4 CDE 36.14 fluid pack lot # [id number]   CATARACT EXTRACTION W/PHACO Left 07/28/2015   Procedure: CATARACT EXTRACTION PHACO AND INTRAOCULAR LENS PLACEMENT (IOC);  Surgeon: Estill Cotta, MD;  Location: [facility];  Service: Ophthalmology;  Laterality: Left;  [place]    1:29.7 AP%  24.9 CDE   41.32 fluid casette lot #[id number] H exp  09/23/2016   COONOSCOPY     AND ENDOSCOPY   DILATION AND CURETTAGE OF UTERUS     ENDARTERECTOMY Left 11/13/2020   Procedure: Evacuation of left neck hematoma;  Surgeon: Katha Cabal, MD;  Location: [facility];  Service: Vascular;  Laterality: Left;   ENDARTERECTOMY Left 11/13/2020   Procedure: ENDARTERECTOMY CAROTID;  Surgeon: Algernon Huxley, MD;  Location: [facility];  Service: Vascular;  Laterality: Left;   TONSILLECTOMY     TUBAL LIGATION      There were no vitals filed for this visit.    Subjective Assessment - 02/04/21 1306     Subjective Pt reports no major changes since last time. Denies any falls.    Patient is accompained by: Family member   Daughter, [name]   Pertinent History 87 y.o. female with medical history significant for type 2 diabetes mellitus, essential hypertension, acquired hypothyroidism, hyperlipidemia, stage III chronic kidney disease reports increased right sided weakness on 10/19/20. She was diagnosed with left basal ganglia ischemic stroke. She was discharged to inpatient rehab for 10 days and then discharged home. Patient was evaluated by outpatient PT on 11/12/20. CVA was due to carotid stenosis. She underwent left carotid endartectomy on 7/21. Procedure went well however patient suffered from left cervical hematoma and had subsequent surgical procedure to stop the bleed and remove  the hematoma on 11/13/20. They were concerned with her breathing and therefore intubated her for 1 day, she was in ICU for 2 days and transferred to med/surge on 11/15/20. Patient was discharged home on 11/17/20 and is now returning to outpatient PT. She has had the stiches removed and is healing well. Denies any neck pain or stiffness. She lives with her daughter and has 24/7 caregiver support. She is still using RW for most ambulation. She is able to transfer to bedside commode well and is able to stand short time unsupported. She reports feeling very weak and fatigued. She reports she has been  dragging her right foot more. She denies any numbness/tingling; She reports feeling like her legs are wanting to do more but is hesitant because of fear of falling. She is mod I for self care morning routine. She does still receive help with showering. She has been working on increasing her activity but denies any formal exercise.    Limitations Standing;Walking    How long can you sit comfortably? NA    How long can you stand comfortably? 30-60 min with support    How long can you walk comfortably? about 100 feet with RW, still limited to short distances;    Diagnostic tests MRI shows left basal ganglia infarct 10/21/20    Patient Stated Goals "Improve balance and walking and not need RW, get back to independent."    Currently in Pain? No/denies    Pain Onset In the past 7 days           INTERVENTIONS-  Reassessment of goals  Education provided throughout for technique with the following tests -  5xSTS: 34 seconds (slight decrease)  6MWT: 408 ft with 4WW (achieved)  - make new goal for >500 ft  10MWT: 0.93 m/s 4WW (improved from 0.5 m/s)   Ascending/descending stairs: ascending/descending recip. steps with BUE support  Berg: 44/56   Pt educated on goal retesting/assessment and indications for progress, PT. Pt verbalized understanding.   Pt educated throughout session about proper posture and technique with exercises. Improved exercise technique, movement at target joints, use of target muscles after min to mod verbal, visual, tactile cues.      [facility] PT Assessment - 02/04/21 0001       Berg Balance Test   Sit to Stand Able to stand without using hands and stabilize independently    Standing Unsupported Able to stand safely 2 minutes    Sitting with Back Unsupported but Feet Supported on Floor or Stool Able to sit safely and securely 2 minutes    Stand to Sit Sits safely with minimal use of hands    Transfers Able to transfer safely, definite need of hands    Standing  Unsupported with Eyes Closed Able to stand 10 seconds safely    Standing Unsupported with Feet Together Able to place feet together independently and stand 1 minute safely    From Standing, Reach Forward with Outstretched Arm Can reach forward >12 cm safely (5")    From Standing Position, Pick up Object from Floor Able to pick up shoe safely and easily    From Standing Position, Turn to Look Behind Over each Shoulder Looks behind from both sides and weight shifts well    Turn 360 Degrees Able to turn 360 degrees safely but slowly    Standing Unsupported, Alternately Place Feet on Step/Stool Able to complete 4 steps without aid or supervision    Standing Unsupported, One Foot in [place]  Needs help to step but can hold 15 seconds    Standing on One Leg Tries to lift leg/unable to hold 3 seconds but remains standing independently    Total Score 44               PT Short Term Goals - 02/04/21 1306       PT SHORT TERM GOAL #1   Title Patient will be adherent to HEP at least 3x a week to improve functional strength and balance for better safety at home.    Baseline 9/21: Pt reports compliance with HEP and is confident    Time 4    Period Weeks    Status Achieved    Target Date 12/31/20      PT SHORT TERM GOAL #2   Title Patient will be independent in transferring sit<>Stand without pushing on arm rests to improve ability to get up from chair.    Baseline 9/21: pt able to complete STS without use of UEs    Time 4    Period Weeks    Status Achieved    Target Date 12/31/20               PT Long Term Goals - 02/04/21 1307       PT LONG TERM GOAL #1   Title Patient (> 44 years old) will complete five times sit to stand test in < 15 seconds indicating an increased LE strength and improved balance.    Baseline 9/21: 29 seconds hands-free 10/10: deferred 10/12: 34 sec hands free    Time 8    Period Weeks    Status On-going    Target Date 04/27/21      PT LONG TERM GOAL #2    Title Patient will increase six minute walk test distance to >400 feet for improve gait ability    Baseline 9/21: 368 ft with RW; 10/10: deferred 10/12: 408 ft    Time 8    Period Weeks    Status Achieved      PT LONG TERM GOAL #3   Title Patient will increase 10 meter walk test to >1.5ms as to improve gait speed for better community ambulation and to reduce fall risk.    Baseline 9/21: 0.5 m/s with RW; 10/10 deferred 10/12: 0.93 m/s with 4WW    Time 8    Period Weeks    Status Partially Met    Target Date 04/27/21      PT LONG TERM GOAL #4   Title Patient will be independent with ascend/descend 6 steps using single UE in step over step pattern without LOB.    Baseline 9/21:  Patient uses PT stairs (4 steps) with bilateral upper extremity support on handrails for both ascending/descending.  Patient exhibits reciprocal pattern ascending and step to pattern descending. 10/10: deferred; 10/12: ascending/descending recip. steps with BUE support    Time 8    Period Weeks    Status On-going    Target Date 04/27/21      PT LONG TERM GOAL #5   Title Patient will demonstrate an improved Berg Balance Score of >45/56 as to demonstrate improved balance with ADLs such as sitting/standing and transfer balance and reduced fall risk.    Baseline 9/21: deferred d/t time; 9/29: 42/56; 10/10: deferred; 10/12: 44/56    Time 8    Period Weeks    Status Partially Met      PT LONG TERM GOAL #6   Title Patient will  improve FOTO score to >60% to indicate improved functional mobility with ADLs.    Baseline 9/21: 59; 10/10: 57    Time 8    Period Weeks    Status Partially Met    Target Date 04/27/21                   Plan - 02/04/21 1617     Clinical Impression Statement Goal reassessment completed. Pt has achieved 6MWT goal and partially met BERG goal, indicating improved gait ability/functional capacity and balance. However, pt score still indicates pt is at increased risk for falls. Pt has  also increased gait speed from 0.5 m/s to 0.93 m/s. Pt did see slight decrease in 5xSTS score (34 sec), indicating pt still with reduced BLE power and fall risk. Last, pt making gains with ability to ascend/descend stairs, by using recip. pattern with both ascending/descending while using BUE support. The pt will benefit from further skilled PT to improve strength, gait, endurance, and balance to increase ease and safety with ADLs.    Personal Factors and Comorbidities Age;Comorbidity 3+;Fitness    Comorbidities HTN, CKD, fall risk, type 2 diabetes,    Examination-Activity Limitations Lift;Locomotion Level;Squat;Stairs;Stand;Toileting;Transfers;Bathing    Examination-Participation Restrictions Cleaning;Community Activity;Laundry;Meal Prep;Shop;Volunteer;Driving    Stability/Clinical Decision Making Stable/Uncomplicated    Rehab Potential Good    PT Frequency 2x / week    PT Duration 12 weeks    PT Treatment/Interventions Cryotherapy;Electrical Stimulation;Moist Heat;Gait training;Stair training;Functional mobility training;Therapeutic activities;Therapeutic exercise;Neuromuscular re-education;Balance training;Patient/family education;Orthotic Fit/Training;Energy conservation    PT Next Visit Plan work on LE strengthening and balance; gait training, endurance    PT Home Exercise Plan Access Code: [email or web address]; no updates    Consulted and Agree with Plan of Care Patient             Patient will benefit from skilled therapeutic intervention in order to improve the following deficits and impairments:  Abnormal gait, Decreased balance, Decreased endurance, Decreased mobility, Difficulty walking, Cardiopulmonary status limiting activity, Decreased activity tolerance, Decreased coordination, Decreased strength  Visit Diagnosis: Muscle weakness (generalized)  Other abnormalities of gait and mobility  Unsteadiness on feet  Other lack of coordination     Problem List Patient Active Problem List    Diagnosis Date Noted   Carotid stenosis, symptomatic, with infarction ([place]) 11/13/2020   Gout flare 11/10/2020   UTI (urinary tract infection) 11/10/2020   Left carotid artery stenosis 11/10/2020   Chronic kidney disease 11/10/2020   Left basal ganglia embolic stroke ([place]) 97/94/8016   PAC (premature atrial contraction)    Pre-procedural cardiovascular examination    Ischemic stroke ([place]) 10/20/2020   TIA (transient ischemic attack) 10/19/2020   Right hemiparesis ([place]) 10/19/2020   Type 2 diabetes mellitus with stage 3 chronic kidney disease, without long-term current use of insulin ([place]) 08/24/2018   Hyperlipidemia, unspecified 07/19/2017   Hypertension 07/19/2017   PUD (peptic ulcer disease) 07/19/2017   Type 2 diabetes mellitus ([place]) 07/19/2017   Personal history of gout 08/12/2016   Anemia, unspecified 04/09/2016   Hypothyroidism, acquired 12/10/2015    Zollie Pee, PT 02/04/2021, 4:21 PM  [place] MAIN [facility] SERVICES [address] [place], [place], [address] Phone: [phone]   Fax:  [phone]  Name: Cassandra Schaefer MRN: [id number] Date of Birth: 12-12-1928

## 2021-02-09 ENCOUNTER — Encounter: Payer: Medicare HMO | Admitting: Speech Pathology

## 2021-02-09 ENCOUNTER — Ambulatory Visit: Payer: Medicare HMO

## 2021-02-09 ENCOUNTER — Other Ambulatory Visit: Payer: Self-pay

## 2021-02-09 ENCOUNTER — Ambulatory Visit: Payer: Medicare HMO | Admitting: Occupational Therapy

## 2021-02-09 DIAGNOSIS — M6281 Muscle weakness (generalized): Secondary | ICD-10-CM

## 2021-02-09 DIAGNOSIS — R2689 Other abnormalities of gait and mobility: Secondary | ICD-10-CM

## 2021-02-09 DIAGNOSIS — R278 Other lack of coordination: Secondary | ICD-10-CM

## 2021-02-09 DIAGNOSIS — R2681 Unsteadiness on feet: Secondary | ICD-10-CM

## 2021-02-09 DIAGNOSIS — I639 Cerebral infarction, unspecified: Secondary | ICD-10-CM

## 2021-02-09 DIAGNOSIS — R482 Apraxia: Secondary | ICD-10-CM

## 2021-02-09 NOTE — Therapy (Signed)
[place] MAIN [facility] SERVICES [address] [place], [place], [address] Phone: [phone]   Fax:  [phone]  Physical Therapy Treatment  Patient Details  Name: Cassandra Schaefer MRN: [id number] Date of Birth: May 06, 1928 Referring Provider (PT): Dr. Dew/Dr. Ginette Pitman PCP   Encounter Date: 02/09/2021   PT End of Session - 02/09/21 1541     Visit Number 16    Number of Visits 38    Date for PT Re-Evaluation 04/27/21    Authorization Type [employer]    Authorization Time Period 02/02/21-04/27/21    Progress Note Due on Visit 20    PT Start Time 1529    PT Stop Time 1559    PT Time Calculation (min) 30 min    Equipment Utilized During Treatment Gait belt    Activity Tolerance Patient tolerated treatment well;No increased pain    Behavior During Therapy WFL for tasks assessed/performed             Past Medical History:  Diagnosis Date   Anemia    Cough    RESOLVING, NO FEVER   Diabetes mellitus without complication ([place])    Gout    Hypertension    Hypothyroidism    PUD (peptic ulcer disease)    Stroke [facility])    Wears dentures    full upper, partial lower    Past Surgical History:  Procedure Laterality Date   AMPUTATION TOE Left 12/14/2017   Procedure: AMPUTATION TOE-4TH MPJ;  Surgeon: Samara Deist, DPM;  Location: [place];  Service: Podiatry;  Laterality: Left;  IVA LOCAL Diabetic - oral meds   APPENDECTOMY     CATARACT EXTRACTION W/PHACO Right 06/23/2015   Procedure: CATARACT EXTRACTION PHACO AND INTRAOCULAR LENS PLACEMENT (IOC);  Surgeon: Estill Cotta, MD;  Location: [facility];  Service: Ophthalmology;  Laterality: Right;  [place] 01:28 AP% 23.4 CDE 36.14 fluid pack lot # [id number]   CATARACT EXTRACTION W/PHACO Left 07/28/2015   Procedure: CATARACT EXTRACTION PHACO AND INTRAOCULAR LENS PLACEMENT (IOC);  Surgeon: Estill Cotta, MD;  Location: [facility];  Service: Ophthalmology;  Laterality: Left;  [place]     1:29.7 AP%  24.9 CDE   41.32 fluid casette lot #[id number] H exp 09/23/2016   COONOSCOPY     AND ENDOSCOPY   DILATION AND CURETTAGE OF UTERUS     ENDARTERECTOMY Left 11/13/2020   Procedure: Evacuation of left neck hematoma;  Surgeon: Katha Cabal, MD;  Location: [facility];  Service: Vascular;  Laterality: Left;   ENDARTERECTOMY Left 11/13/2020   Procedure: ENDARTERECTOMY CAROTID;  Surgeon: Algernon Huxley, MD;  Location: [facility];  Service: Vascular;  Laterality: Left;   TONSILLECTOMY     TUBAL LIGATION      There were no vitals filed for this visit.   Subjective Assessment - 02/09/21 1538     Subjective Pt reports no major changes since last time. Denies any falls.    Pertinent History 87 y.o. female with medical history significant for type 2 diabetes mellitus, essential hypertension, acquired hypothyroidism, hyperlipidemia, stage III chronic kidney disease reports increased right sided weakness on 10/19/20. She was diagnosed with left basal ganglia ischemic stroke. She was discharged to inpatient rehab for 10 days and then discharged home. Patient was evaluated by outpatient PT on 11/12/20. CVA was due to carotid stenosis. She underwent left carotid endartectomy on 7/21. Procedure went well however patient suffered from left cervical hematoma and had subsequent surgical procedure to stop the bleed and remove the hematoma  on 11/13/20. They were concerned with her breathing and therefore intubated her for 1 day, she was in ICU for 2 days and transferred to med/surge on 11/15/20. Patient was discharged home on 11/17/20 and is now returning to outpatient PT. She has had the stiches removed and is healing well. Denies any neck pain or stiffness. She lives with her daughter and has 24/7 caregiver support. She is still using RW for most ambulation. She is able to transfer to bedside commode well and is able to stand short time unsupported. She reports feeling very weak and fatigued. She reports she has been  dragging her right foot more. She denies any numbness/tingling; She reports feeling like her legs are wanting to do more but is hesitant because of fear of falling. She is mod I for self care morning routine. She does still receive help with showering. She has been working on increasing her activity but denies any formal exercise.    Currently in Pain? No/denies           INTERVENTION THIS DATE: -STS from chair +airex 1x5, minA pelvis due to rapid progress of fatigue -Standing heel raises x20 -standing marching 1x10 bilat   -STS from chair +airex 1x5, minA pelvis due to rapid progress of fatigue -Standing heel raises x20 -standing marching 1x10 bilat   -seated cable hip extension (end range flexion to 90) 1x10 bilat @ 7.5lb -seated cable hamstrings resistance 1x10 Rt '@2' .5, 1x12 left @ 7.5  -seated cable hip extension (end range flexion to 90) 1x10 Rt; 1x8 Left @ 17.5lb -seated cable hamstrings resistance 1x10 Rt '@2' .5, 1x12 left @ 7.5    PT Education - 02/09/21 1540     Education Details safe modifications for exericses here.    Person(s) Educated Patient    Methods Explanation;Demonstration    Comprehension Verbalized understanding              PT Short Term Goals - 02/04/21 1306       PT SHORT TERM GOAL #1   Title Patient will be adherent to HEP at least 3x a week to improve functional strength and balance for better safety at home.    Baseline 9/21: Pt reports compliance with HEP and is confident    Time 4    Period Weeks    Status Achieved    Target Date 12/31/20      PT SHORT TERM GOAL #2   Title Patient will be independent in transferring sit<>Stand without pushing on arm rests to improve ability to get up from chair.    Baseline 9/21: pt able to complete STS without use of UEs    Time 4    Period Weeks    Status Achieved    Target Date 12/31/20               PT Long Term Goals - 02/04/21 1307       PT LONG TERM GOAL #1   Title Patient (> 77  years old) will complete five times sit to stand test in < 15 seconds indicating an increased LE strength and improved balance.    Baseline 9/21: 29 seconds hands-free 10/10: deferred 10/12: 34 sec hands free    Time 8    Period Weeks    Status On-going    Target Date 04/27/21      PT LONG TERM GOAL #2   Title Patient will increase six minute walk test distance to >400 feet for improve gait ability  Baseline 9/21: 368 ft with RW; 10/10: deferred 10/12: 408 ft    Time 8    Period Weeks    Status Achieved      PT LONG TERM GOAL #3   Title Patient will increase 10 meter walk test to >1.90ms as to improve gait speed for better community ambulation and to reduce fall risk.    Baseline 9/21: 0.5 m/s with RW; 10/10 deferred 10/12: 0.93 m/s with 4WW    Time 8    Period Weeks    Status Partially Met    Target Date 04/27/21      PT LONG TERM GOAL #4   Title Patient will be independent with ascend/descend 6 steps using single UE in step over step pattern without LOB.    Baseline 9/21:  Patient uses PT stairs (4 steps) with bilateral upper extremity support on handrails for both ascending/descending.  Patient exhibits reciprocal pattern ascending and step to pattern descending. 10/10: deferred; 10/12: ascending/descending recip. steps with BUE support    Time 8    Period Weeks    Status On-going    Target Date 04/27/21      PT LONG TERM GOAL #5   Title Patient will demonstrate an improved Berg Balance Score of >45/56 as to demonstrate improved balance with ADLs such as sitting/standing and transfer balance and reduced fall risk.    Baseline 9/21: deferred d/t time; 9/29: 42/56; 10/10: deferred; 10/12: 44/56    Time 8    Period Weeks    Status Partially Met      PT LONG TERM GOAL #6   Title Patient will improve FOTO score to >60% to indicate improved functional mobility with ADLs.    Baseline 9/21: 59; 10/10: 57    Time 8    Period Weeks    Status Partially Met    Target Date 04/27/21                    Plan - 02/09/21 1544     Clinical Impression Statement Continued with current plan of care as laid out in evaluation and recent prior sessions. All interventions tolerated as expected by author. Recovery intervals given as needed based on signs of exertion and/or pt request. Pt educated on best technique for each intervention- author uses verbal, visual, tactile cues to optimize learning. Author takes steps to maximize patient independence when appropriate. Pt remains highly motivated. Pt closely monitored throughout session for safe activity response, as well as to maximize patient safety during interventions. The patient's therapy prognosis indicates continued potential for improvement, anticipate that future progress is attainable in a reasonable/predictable timeframe. Maximum improvement is within reach. Pt will continue to benefit from skilled PT services to address deficits and impairment identified in evaluation in order to maximize independence and safety in basic mobility required for performance of ADL, IADL, and leisure.    Personal Factors and Comorbidities Age    Comorbidities HTN, CKD, fall risk, type 2 diabetes,    Examination-Activity Limitations Lift;Locomotion Level;Squat;Stairs;Stand;Toileting;Transfers;Bathing    Examination-Participation Restrictions Cleaning;Community Activity;Laundry;Meal Prep;Shop;Volunteer;Driving    Stability/Clinical Decision Making Stable/Uncomplicated    Clinical Decision Making Low    Rehab Potential Good    PT Frequency 2x / week    PT Duration 12 weeks    PT Treatment/Interventions Cryotherapy;Electrical Stimulation;Moist Heat;Gait training;Stair training;Functional mobility training;Therapeutic activities;Therapeutic exercise;Neuromuscular re-education;Balance training;Patient/family education;Orthotic Fit/Training;Energy conservation    PT Next Visit Plan work on LE strengthening and balance; gait training, endurance  PT Home Exercise Plan Access Code: [email or web address]; no updates    Consulted and Agree with Plan of Care Patient             Patient will benefit from skilled therapeutic intervention in order to improve the following deficits and impairments:  Abnormal gait, Decreased balance, Decreased endurance, Decreased mobility, Difficulty walking, Cardiopulmonary status limiting activity, Decreased activity tolerance, Decreased coordination, Decreased strength  Visit Diagnosis: Muscle weakness (generalized)  Other lack of coordination  Apraxia  Other abnormalities of gait and mobility  Unsteadiness on feet  Left basal ganglia embolic stroke [facility])     Problem List Patient Active Problem List   Diagnosis Date Noted   Carotid stenosis, symptomatic, with infarction ([place]) 11/13/2020   Gout flare 11/10/2020   UTI (urinary tract infection) 11/10/2020   Left carotid artery stenosis 11/10/2020   Chronic kidney disease 11/10/2020   Left basal ganglia embolic stroke ([place]) 93/23/5573   PAC (premature atrial contraction)    Pre-procedural cardiovascular examination    Ischemic stroke ([place]) 10/20/2020   TIA (transient ischemic attack) 10/19/2020   Right hemiparesis ([place]) 10/19/2020   Type 2 diabetes mellitus with stage 3 chronic kidney disease, without long-term current use of insulin ([place]) 08/24/2018   Hyperlipidemia, unspecified 07/19/2017   Hypertension 07/19/2017   PUD (peptic ulcer disease) 07/19/2017   Type 2 diabetes mellitus ([place]) 07/19/2017   Personal history of gout 08/12/2016   Anemia, unspecified 04/09/2016   Hypothyroidism, acquired 12/10/2015   4:03 PM, 02/09/21 Etta Grandchild, PT, DPT Physical Therapist - [facility]  Outpatient Physical Therapy- [place] [phone]     [place], PT 02/09/2021, 3:51 PM  [place] MAIN [facility] SERVICES [address] [place], [place], [address] Phone:  [phone]   Fax:  [phone]  Name: Cassandra Schaefer MRN: [id number] Date of Birth: 24-Jan-1929

## 2021-02-10 ENCOUNTER — Encounter: Payer: Self-pay | Admitting: Occupational Therapy

## 2021-02-10 NOTE — Therapy (Signed)
[place] [facility] MAIN [facility] SERVICES [address] [place], [place], [address] Phone: [phone]   Fax:  [phone]  Occupational Therapy Treatment  Patient Details  Name: Cassandra Schaefer MRN: [id number] Date of Birth: 1929-01-05 Referring Provider (OT): Dr. Barbette Reichmann   Encounter Date: 02/09/2021   OT End of Session - 02/10/21 1535     Visit Number 19    Number of Visits 40    Date for OT Re-Evaluation 04/21/21    Authorization Time Period Reporting period starting 01/07/2021    OT Start Time 1600    OT Stop Time 1645    OT Time Calculation (min) 45 min    Activity Tolerance Patient tolerated treatment well    Behavior During Therapy WFL for tasks assessed/performed             Past Medical History:  Diagnosis Date   Anemia    Cough    RESOLVING, NO FEVER   Diabetes mellitus without complication ([facility])    Gout    Hypertension    Hypothyroidism    PUD (peptic ulcer disease)    Stroke [facility])    Wears dentures    full upper, partial lower    Past Surgical History:  Procedure Laterality Date   AMPUTATION TOE Left 12/14/2017   Procedure: AMPUTATION TOE-4TH MPJ;  Surgeon: Gwyneth Revels, DPM;  Location: [facility];  Service: Podiatry;  Laterality: Left;  IVA LOCAL Diabetic - oral meds   APPENDECTOMY     CATARACT EXTRACTION W/PHACO Right 06/23/2015   Procedure: CATARACT EXTRACTION PHACO AND INTRAOCULAR LENS PLACEMENT (IOC);  Surgeon: Sallee Lange, MD;  Location: [facility];  Service: Ophthalmology;  Laterality: Right;  [place] 01:28 AP% 23.4 CDE 36.14 fluid pack lot # [id number]   CATARACT EXTRACTION W/PHACO Left 07/28/2015   Procedure: CATARACT EXTRACTION PHACO AND INTRAOCULAR LENS PLACEMENT (IOC);  Surgeon: Sallee Lange, MD;  Location: [facility];  Service: Ophthalmology;  Laterality: Left;  [place]    1:29.7 AP%  24.9 CDE   41.32 fluid casette lot #[id number] H exp 09/23/2016   COONOSCOPY     AND ENDOSCOPY   DILATION AND  CURETTAGE OF UTERUS     ENDARTERECTOMY Left 11/13/2020   Procedure: Evacuation of left neck hematoma;  Surgeon: Renford Dills, MD;  Location: [facility];  Service: Vascular;  Laterality: Left;   ENDARTERECTOMY Left 11/13/2020   Procedure: ENDARTERECTOMY CAROTID;  Surgeon: Annice Needy, MD;  Location: [facility];  Service: Vascular;  Laterality: Left;   TONSILLECTOMY     TUBAL LIGATION      There were no vitals filed for this visit.   Subjective Assessment - 02/10/21 1533     Subjective  Pt. reports she is considering    Patient is accompanied by: Family member    Pertinent History R ankle pain, gout, T2DM, HTN, CKDIII; R/L carotid endarectomy    Currently in Pain? No/denies            OT TREATMENT    Neuro muscular re-education:  Pt. worked on right hand [facility] skills grasping, and manipulating Dominoes. Pt. worked on using her right hand to flip the dominos, turn them from a flat position at the tabletop to a horizontal position on their side. Pt. was able to grasp one at a time, and move them through her hand. Pt. was able to formulate a strategy. Pt. Worked on [facility] skills grasping, and removing flat marbles from various heights of peg columns.  Pt. reports  that she is interested in seeing if she can pick up Dominos with her right hand, as pt. Is anticipating returning to her Domino group tomorrow. Pt. was able to manipulate the dominoes, as well as multiple Dominoes in preparation for game play.  Pt. Is improving with RUE, and hand motor control, and coordination skills, and has improved with accuracy. Pt. Reports being so happy that she can manipulate Dominoes with her right hand. Pt. Continues to work on improving [facility] skills in order to improve RUE functioning in preparation for ADL, and IADL functioning.                           OT Education - 02/10/21 1535     Education Details GMC activities to target reaching with RUE    Person(s) Educated Patient     Methods Explanation;Verbal cues;Demonstration    Comprehension Verbalized understanding;Returned demonstration              OT Short Term Goals - 01/28/21 1644       OT SHORT TERM GOAL #1   Title Pt will perform HEP for RUE strength/coordination independently.    Baseline Eval: HEP not yet established in outpatient, but pt is working with putty from [name]; 12/04/2020; [facility] HEP initiated this day with mod vc after return demo; 01/05/2021: indep with currently program, but ongoing as pt progresses; 01/28/2021: vc to revisit and focus on grip and pinch strengthening with putty    Time 6    Period Weeks    Status On-going    Target Date 03/10/21               OT Long Term Goals - 01/28/21 1712       OT LONG TERM GOAL #1   Title Pt will improve hand writing to 100% legibility with R hand to be able to independently write a check.    Baseline Eval: signature is 75% legible with R hand, requiring extra time; 12/04/2020: no change from eval; 01/05/2021: Printing is 100% legible, signature is 90% legible, but still requires extra time and effort.; 01/26/21: Signature is 90% legible and speed/fluidity have improved    Time 12    Period Weeks    Status On-going    Target Date 04/21/21      OT LONG TERM GOAL #2   Title Pt will improve R hand coordination to enable good manipulation of iphone using R dominant hand    Baseline Eval: Pt uses L hand d/t R hand lacking sufficient FMC to press buttons on phone; 12/04/2020: no change from eval; 01/05/2021: R hand coordination is improving but pt still uses L hand to dial phone; 01/28/21: Pt using R dominant hand to press buttons and apps on phone 50% of the time.    Time 12    Period Weeks    Status On-going    Target Date 04/21/21      OT LONG TERM GOAL #3   Title Pt will improve GMC throughout RUE to enable pt to reach for and pick up ADL supplies with R dominant hand without dropping or knocking over objects.    Baseline Eval: Pt reports RUE is  clumsy and easily knocks over objects when trying to reach for ADL supplies.  Pt verbalizes that her "aim is off."; 12/04/2020: pt reports slight improvement since eval and has started using her R hand to eat, but still feels clumsy; 01/05/2021: Greatly improved; pt is  using silverware to eat with R hand, can comb hair with RUE, still mildly ataxic; 01/26/21: pt reports difficulty picking up objects within a small space (ie; may knock the toothpaste holder down when reaching for toothbrush), but reaching for light/larger objects is improving    Time 12    Period Weeks    Status On-going    Target Date 04/21/21      OT LONG TERM GOAL #4   Title Pt will improve FOTO score to 68 or better to indicate measurable functional improvement.    Baseline Eval: FOTO 55; 01/05/2021: FOTO 61; 01/28/21: FOTO 61    Time 12    Period Weeks    Status On-going    Target Date 04/21/21      OT LONG TERM GOAL #5   Title Pt will perform dynamic standing tasks with modified indep, AD as needed in order to reduce fall risk with ADLs    Baseline Eval: CGA-min A for all dynamic standing tasks using RW; 12/04/2020: pt is using RW in home with distant supv, but family still provides close supv on stairs; 01/05/2021: Pt now using RW to amb in and around the house, can perform ADLs with RW and modified indep, and can manage stairs at home with modified indep.  No falls reported.    Time 12    Period Weeks    Status Achieved    Target Date 02/01/21                   Plan - 02/10/21 1536     Clinical Impression Statement Pt. reports that she is interested in seeing if she can pick up Dominos with her right hand, as pt. Is anticipating returning to her Domino group tomorrow. Pt. was able to manipulate the dominoes, as well as multiple Dominoes in preparation for game play.  Pt. Is improving with RUE, and hand motor control, and coordination skills, and has improved with accuracy. Pt. Reports being so happy that she can  manipulate Dominoes with her right hand. Pt. Continues to work on improving [facility] skills in order to improve RUE functioning in preparation for ADL, and IADL functioning.        OT Occupational Profile and History Detailed Assessment- Review of Records and additional review of physical, cognitive, psychosocial history related to current functional performance    Occupational performance deficits (Please refer to evaluation for details): ADL's;Leisure;IADL's    Body Structure / Function / Physical Skills ADL;Coordination;Endurance;GMC;UE functional use;Balance;Sensation;Body mechanics;IADL;Pain;Dexterity;FMC;Strength;Gait;Mobility    Rehab Potential Good    Clinical Decision Making Several treatment options, min-mod task modification necessary    Comorbidities Affecting Occupational Performance: May have comorbidities impacting occupational performance    Modification or Assistance to Complete Evaluation  Min-Moderate modification of tasks or assist with assess necessary to complete eval    OT Frequency 2x / week    OT Duration 12 weeks    OT Treatment/Interventions Self-care/ADL training;Therapeutic exercise;DME and/or AE instruction;Functional Mobility Training;Balance training;Neuromuscular education;Therapeutic activities;Patient/family education    Consulted and Agree with Plan of Care Patient             Patient will benefit from skilled therapeutic intervention in order to improve the following deficits and impairments:   Body Structure / Function / Physical Skills: ADL, Coordination, Endurance, GMC, UE functional use, Balance, Sensation, Body mechanics, IADL, Pain, Dexterity, FMC, Strength, Gait, Mobility       Visit Diagnosis: Muscle weakness (generalized)  Other lack of coordination  Problem List Patient Active Problem List   Diagnosis Date Noted   Carotid stenosis, symptomatic, with infarction (HCC) 11/13/2020   Gout flare 11/10/2020   UTI (urinary tract infection)  11/10/2020   Left carotid artery stenosis 11/10/2020   Chronic kidney disease 11/10/2020   Left basal ganglia embolic stroke (HCC) 10/24/2020   PAC (premature atrial contraction)    Pre-procedural cardiovascular examination    Ischemic stroke (HCC) 10/20/2020   TIA (transient ischemic attack) 10/19/2020   Right hemiparesis (HCC) 10/19/2020   Type 2 diabetes mellitus with stage 3 chronic kidney disease, without long-term current use of insulin ([facility]CC) 08/24/2018   Hyperlipidemia, unspecified 07/19/2017   Hypertension 07/19/2017   PUD (peptic ulcer disease) 07/19/2017   Type 2 diabetes mellitus (HCC) 07/19/2017   Personal history of gout 08/12/2016   Anemia, unspecified 04/09/2016   Hypothyroidism, acquired 12/10/2015    Olegario Messier, MS, OTR/L 02/10/2021, 3:38 PM  [place] [facility] MAIN [facility] SERVICES [address] [place], [place], [address] Phone: [phone]   Fax:  [phone]  Name: Cassandra Schaefer MRN: [id number] Date of Birth: [birth year]

## 2021-02-11 ENCOUNTER — Ambulatory Visit: Payer: Medicare HMO | Admitting: Occupational Therapy

## 2021-02-11 ENCOUNTER — Encounter: Payer: Medicare HMO | Admitting: Speech Pathology

## 2021-02-11 ENCOUNTER — Ambulatory Visit: Payer: Medicare HMO

## 2021-02-11 ENCOUNTER — Other Ambulatory Visit: Payer: Self-pay

## 2021-02-11 DIAGNOSIS — I639 Cerebral infarction, unspecified: Secondary | ICD-10-CM

## 2021-02-11 DIAGNOSIS — M6281 Muscle weakness (generalized): Secondary | ICD-10-CM | POA: Diagnosis not present

## 2021-02-11 DIAGNOSIS — R2689 Other abnormalities of gait and mobility: Secondary | ICD-10-CM

## 2021-02-11 DIAGNOSIS — R278 Other lack of coordination: Secondary | ICD-10-CM

## 2021-02-11 DIAGNOSIS — R482 Apraxia: Secondary | ICD-10-CM

## 2021-02-11 DIAGNOSIS — R2681 Unsteadiness on feet: Secondary | ICD-10-CM

## 2021-02-11 NOTE — Therapy (Signed)
[place] MAIN [facility] SERVICES [address] [place], [place], [address] Phone: [phone]   Fax:  [phone]  Physical Therapy Treatment  Patient Details  Name: Cassandra Schaefer MRN: [id number] Date of Birth: Dec 15, 1928 Referring Provider (PT): Dr. Dew/Dr. Ginette Pitman PCP   Encounter Date: 02/11/2021   PT End of Session - 02/11/21 1534     Visit Number 17    Number of Visits 38    Date for PT Re-Evaluation 04/27/21    Authorization Type [employer]    Authorization Time Period 02/02/21-04/27/21    Progress Note Due on Visit 20    Equipment Utilized During Treatment Gait belt    Activity Tolerance Patient tolerated treatment well;No increased pain    Behavior During Therapy WFL for tasks assessed/performed             Past Medical History:  Diagnosis Date   Anemia    Cough    RESOLVING, NO FEVER   Diabetes mellitus without complication ([place])    Gout    Hypertension    Hypothyroidism    PUD (peptic ulcer disease)    Stroke [facility])    Wears dentures    full upper, partial lower    Past Surgical History:  Procedure Laterality Date   AMPUTATION TOE Left 12/14/2017   Procedure: AMPUTATION TOE-4TH MPJ;  Surgeon: Samara Deist, DPM;  Location: [place];  Service: Podiatry;  Laterality: Left;  IVA LOCAL Diabetic - oral meds   APPENDECTOMY     CATARACT EXTRACTION W/PHACO Right 06/23/2015   Procedure: CATARACT EXTRACTION PHACO AND INTRAOCULAR LENS PLACEMENT (IOC);  Surgeon: Estill Cotta, MD;  Location: [facility];  Service: Ophthalmology;  Laterality: Right;  [place] 01:28 AP% 23.4 CDE 36.14 fluid pack lot # [id number]   CATARACT EXTRACTION W/PHACO Left 07/28/2015   Procedure: CATARACT EXTRACTION PHACO AND INTRAOCULAR LENS PLACEMENT (IOC);  Surgeon: Estill Cotta, MD;  Location: [facility];  Service: Ophthalmology;  Laterality: Left;  [place]    1:29.7 AP%  24.9 CDE   41.32 fluid casette lot #[id number] H exp 09/23/2016   COONOSCOPY      AND ENDOSCOPY   DILATION AND CURETTAGE OF UTERUS     ENDARTERECTOMY Left 11/13/2020   Procedure: Evacuation of left neck hematoma;  Surgeon: Katha Cabal, MD;  Location: [facility];  Service: Vascular;  Laterality: Left;   ENDARTERECTOMY Left 11/13/2020   Procedure: ENDARTERECTOMY CAROTID;  Surgeon: Algernon Huxley, MD;  Location: [facility];  Service: Vascular;  Laterality: Left;   TONSILLECTOMY     TUBAL LIGATION      There were no vitals filed for this visit.   Subjective Assessment - 02/11/21 1529     Subjective Pt reports no major changes since last time. Denies any falls. Pt denies any soreness from extreme gluteal conditioning last session.    Pertinent History 87 y.o. female with medical history significant for type 2 diabetes mellitus, essential hypertension, acquired hypothyroidism, hyperlipidemia, stage III chronic kidney disease reports increased right sided weakness on 10/19/20. She was diagnosed with left basal ganglia ischemic stroke. She was discharged to inpatient rehab for 10 days and then discharged home. Patient was evaluated by outpatient PT on 11/12/20. CVA was due to carotid stenosis. She underwent left carotid endartectomy on 7/21. Procedure went well however patient suffered from left cervical hematoma and had subsequent surgical procedure to stop the bleed and remove the hematoma on 11/13/20. They were concerned with her breathing and therefore intubated her for  1 day, she was in ICU for 2 days and transferred to med/surge on 11/15/20. Patient was discharged home on 11/17/20 and is now returning to outpatient PT. She has had the stiches removed and is healing well. Denies any neck pain or stiffness. She lives with her daughter and has 24/7 caregiver support. She is still using RW for most ambulation. She is able to transfer to bedside commode well and is able to stand short time unsupported. She reports feeling very weak and fatigued. She reports she has been dragging her right  foot more. She denies any numbness/tingling; She reports feeling like her legs are wanting to do more but is hesitant because of fear of falling. She is mod I for self care morning routine. She does still receive help with showering. She has been working on increasing her activity but denies any formal exercise.    Currently in Pain? No/denies              INTERVENTION THIS DATE:  -seated cable hip extension (end range flexion to 90) 1x10 bilat @ 12.5lb -seated cable hamstrings resistance 1x15 Rt '@2' .5, 1x15 left @ 7.5lb  -seated cable hip extension (end range flexion to 90) 1x10 bilat @ 12.5lb -seated cable hamstrings resistance 1x15 Rt '@3' .5, 1x15 left @ 5.5lb  -STS from chair+airex, chain in place for postural cues and tibial block  *extensive education on using gluteals to perform v dominant quads strategy 1x8, hands on arm rests 1x8, hands free for first 5   -OVERGROUND AMB with rollator 392f, cues for steppage gait on 2nd 1539flap; severe onset RLE leg drage after 30020fbreak taken   -3 minutes seated recovery interval  -AMB to OT area ~[id number], minguard assist    The patient's therapy prognosis indicates continued potential for improvement, anticipate that future progress is attainable in a reasonable/predictable timeframe. Maximum improvement is within reach. Pt will continue to benefit from skilled PT services to address deficits and impairment identified in evaluation in order to maximize independence and safety in basic mobility required for performance of ADL, IADL, and leisure.       PT Education - 02/11/21 1533     Education Details safe use of cables    Person(s) Educated Patient    Methods Explanation    Comprehension Verbalized understanding              PT Short Term Goals - 02/04/21 1306       PT SHORT TERM GOAL #1   Title Patient will be adherent to HEP at least 3x a week to improve functional strength and balance for better safety at home.     Baseline 9/21: Pt reports compliance with HEP and is confident    Time 4    Period Weeks    Status Achieved    Target Date 12/31/20      PT SHORT TERM GOAL #2   Title Patient will be independent in transferring sit<>Stand without pushing on arm rests to improve ability to get up from chair.    Baseline 9/21: pt able to complete STS without use of UEs    Time 4    Period Weeks    Status Achieved    Target Date 12/31/20               PT Long Term Goals - 02/04/21 1307       PT LONG TERM GOAL #1   Title Patient (> 60 y79rs old) will complete five  times sit to stand test in < 15 seconds indicating an increased LE strength and improved balance.    Baseline 9/21: 29 seconds hands-free 10/10: deferred 10/12: 34 sec hands free    Time 8    Period Weeks    Status On-going    Target Date 04/27/21      PT LONG TERM GOAL #2   Title Patient will increase six minute walk test distance to >400 feet for improve gait ability    Baseline 9/21: 368 ft with RW; 10/10: deferred 10/12: 408 ft    Time 8    Period Weeks    Status Achieved      PT LONG TERM GOAL #3   Title Patient will increase 10 meter walk test to >1.82ms as to improve gait speed for better community ambulation and to reduce fall risk.    Baseline 9/21: 0.5 m/s with RW; 10/10 deferred 10/12: 0.93 m/s with 4WW    Time 8    Period Weeks    Status Partially Met    Target Date 04/27/21      PT LONG TERM GOAL #4   Title Patient will be independent with ascend/descend 6 steps using single UE in step over step pattern without LOB.    Baseline 9/21:  Patient uses PT stairs (4 steps) with bilateral upper extremity support on handrails for both ascending/descending.  Patient exhibits reciprocal pattern ascending and step to pattern descending. 10/10: deferred; 10/12: ascending/descending recip. steps with BUE support    Time 8    Period Weeks    Status On-going    Target Date 04/27/21      PT LONG TERM GOAL #5   Title Patient  will demonstrate an improved Berg Balance Score of >45/56 as to demonstrate improved balance with ADLs such as sitting/standing and transfer balance and reduced fall risk.    Baseline 9/21: deferred d/t time; 9/29: 42/56; 10/10: deferred; 10/12: 44/56    Time 8    Period Weeks    Status Partially Met      PT LONG TERM GOAL #6   Title Patient will improve FOTO score to >60% to indicate improved functional mobility with ADLs.    Baseline 9/21: 59; 10/10: 57    Time 8    Period Weeks    Status Partially Met    Target Date 04/27/21                   Plan - 02/11/21 1534     Clinical Impression Statement Continued with current plan of care as laid out in evaluation and recent prior sessions. Today continued with focal targetting of deep range hip extension, able to dial in a great degree of specificity in resistance compared ot prior session. Pt al able to perform improved STS power using gluteals. All interventions tolerated as expected by author. Mobility and strength continue to progress as anticipated. Recovery intervals given as needed based on signs of exertion and/or pt request. Pt educated on best technique for each intervention- author uses verbal, visual, tactile cues to optimize learning. Author takes steps to maximize patient independence when appropriate. Pt remains highly motivated. Pt closely monitored throughout session for safe activity response, as well as to maximize patient safety during interventions.    Personal Factors and Comorbidities Age    Comorbidities HTN, CKD, fall risk, type 2 diabetes,    Examination-Activity Limitations Lift;Locomotion Level;Squat;Stairs;Stand;Toileting;Transfers;Bathing    Examination-Participation Restrictions Cleaning;Community Activity;Laundry;Meal Prep;Shop;Volunteer;Driving    Stability/Clinical Decision  Making Stable/Uncomplicated    Clinical Decision Making Low    Rehab Potential Poor    PT Frequency 2x / week    PT Duration 12  weeks    PT Treatment/Interventions Cryotherapy;Electrical Stimulation;Moist Heat;Gait training;Stair training;Functional mobility training;Therapeutic activities;Therapeutic exercise;Neuromuscular re-education;Balance training;Patient/family education;Orthotic Fit/Training;Energy conservation    PT Next Visit Plan work on LE strengthening and balance; gait training, endurance    PT Home Exercise Plan Access Code: [email or web address]; no updates    Consulted and Agree with Plan of Care Patient             Patient will benefit from skilled therapeutic intervention in order to improve the following deficits and impairments:  Abnormal gait, Decreased balance, Decreased endurance, Decreased mobility, Difficulty walking, Cardiopulmonary status limiting activity, Decreased activity tolerance, Decreased coordination, Decreased strength  Visit Diagnosis: Muscle weakness (generalized)  Other lack of coordination  Apraxia  Other abnormalities of gait and mobility  Unsteadiness on feet  Left basal ganglia embolic stroke [facility])     Problem List Patient Active Problem List   Diagnosis Date Noted   Carotid stenosis, symptomatic, with infarction ([place]) 11/13/2020   Gout flare 11/10/2020   UTI (urinary tract infection) 11/10/2020   Left carotid artery stenosis 11/10/2020   Chronic kidney disease 11/10/2020   Left basal ganglia embolic stroke ([place]) 42/68/3419   PAC (premature atrial contraction)    Pre-procedural cardiovascular examination    Ischemic stroke ([place]) 10/20/2020   TIA (transient ischemic attack) 10/19/2020   Right hemiparesis ([place]) 10/19/2020   Type 2 diabetes mellitus with stage 3 chronic kidney disease, without long-term current use of insulin ([place]) 08/24/2018   Hyperlipidemia, unspecified 07/19/2017   Hypertension 07/19/2017   PUD (peptic ulcer disease) 07/19/2017   Type 2 diabetes mellitus ([place]) 07/19/2017   Personal history of gout 08/12/2016   Anemia, unspecified 04/09/2016    Hypothyroidism, acquired 12/10/2015  4:09 PM, 02/11/21 Etta Grandchild, PT, DPT Physical Therapist - [facility]  Outpatient Physical Therapy- [facility] [phone]     [place], PT 02/11/2021, 3:43 PM  [place] MAIN [facility] SERVICES [address] [place], [place], [address] Phone: [phone]   Fax:  [phone]  Name: Jazzalyn Loewenstein MRN: [id number] Date of Birth: 19-Jan-1929

## 2021-02-11 NOTE — Therapy (Signed)
[facility] MAIN [facility] SERVICES [address] [place], [place], [address] Phone: [phone]   Fax:  [phone]  Occupational Therapy Progress Note  Dates of reporting period  01/07/2021   to   02/11/2021   Patient Details  Name: Cassandra Schaefer MRN: [id number] Date of Birth: 1928/11/19 Referring Provider (OT): Dr. Barbette Reichmann   Encounter Date: 02/11/2021   OT End of Session - 02/11/21 1628     Visit Number 20    Number of Visits 40    Date for OT Re-Evaluation 04/21/21    Authorization Time Period Reporting period starting 01/07/2021    OT Start Time 1600    OT Stop Time 1645    OT Time Calculation (min) 45 min    Activity Tolerance Patient tolerated treatment well    Behavior During Therapy WFL for tasks assessed/performed             Past Medical History:  Diagnosis Date   Anemia    Cough    RESOLVING, NO FEVER   Diabetes mellitus without complication ([facility])    Gout    Hypertension    Hypothyroidism    PUD (peptic ulcer disease)    Stroke [facility])    Wears dentures    full upper, partial lower    Past Surgical History:  Procedure Laterality Date   AMPUTATION TOE Left 12/14/2017   Procedure: AMPUTATION TOE-4TH MPJ;  Surgeon: Gwyneth Revels, DPM;  Location: [facility];  Service: Podiatry;  Laterality: Left;  IVA LOCAL Diabetic - oral meds   APPENDECTOMY     CATARACT EXTRACTION W/PHACO Right 06/23/2015   Procedure: CATARACT EXTRACTION PHACO AND INTRAOCULAR LENS PLACEMENT (IOC);  Surgeon: Sallee Lange, MD;  Location: [facility];  Service: Ophthalmology;  Laterality: Right;  [place] 01:28 AP% 23.4 CDE 36.14 fluid pack lot # [id number]   CATARACT EXTRACTION W/PHACO Left 07/28/2015   Procedure: CATARACT EXTRACTION PHACO AND INTRAOCULAR LENS PLACEMENT (IOC);  Surgeon: Sallee Lange, MD;  Location: [facility];  Service: Ophthalmology;  Laterality: Left;  [place]    1:29.7 AP%  24.9 CDE   41.32 fluid casette lot #[id number] H exp  09/23/2016   COONOSCOPY     AND ENDOSCOPY   DILATION AND CURETTAGE OF UTERUS     ENDARTERECTOMY Left 11/13/2020   Procedure: Evacuation of left neck hematoma;  Surgeon: Renford Dills, MD;  Location: [facility];  Service: Vascular;  Laterality: Left;   ENDARTERECTOMY Left 11/13/2020   Procedure: ENDARTERECTOMY CAROTID;  Surgeon: Annice Needy, MD;  Location: [facility];  Service: Vascular;  Laterality: Left;   TONSILLECTOMY     TUBAL LIGATION      There were no vitals filed for this visit.   Subjective Assessment - 02/11/21 1627     Subjective  Pt. reports that she played Dominoes with her group yesterday.    Patient is accompanied by: Family member                [place] [facility] OT Assessment - 02/11/21 1639       Coordination   Right 9 Hole Peg Test 44 sec,    Left 9 Hole Peg Test 39 sec.      Hand Function   Right Hand Grip (lbs) 29    Right Hand Lateral Pinch 13 lbs    Right Hand 3 Point Pinch 10 lbs            Neuro muscular re-education:   Pt. worked on right hand  [facility] skills grasping, and manipulating smaller, and mor light weight Dominoes. Pt. worked on using her right hand to flip the dominos, turn them from a flat position at the tabletop to a horizontal position on their side. Pt. was able to grasp one at a time, and move them through her hand. Pt. was able to formulate a strategy. Pt. Worked on [facility] skills grasping, and removing flat marbles from various heights of peg columns.  Measurements were obtained, and goals were reviewed with the pt.  Pt. has made excellent progress overall with right UE motor control, and [facility] skills. Pt. Has improved with right UE grip strength, pinch strength, and FMC skills. Pt. has improved with speed, and accuracy with reaching for targets. Pt. Continues to work on improving RUE strength, motor control, and [facility] skills in order to improve accuracy with writing legibility, iphone use, and overall engagement during ADLs, and IADL tasks.                       OT Short Term Goals - 01/28/21 1644       OT SHORT TERM GOAL #1   Title Pt will perform HEP for RUE strength/coordination independently.    Baseline Eval: HEP not yet established in outpatient, but pt is working with putty from [name]; 12/04/2020; [facility] HEP initiated this day with mod vc after return demo; 01/05/2021: indep with currently program, but ongoing as pt progresses; 01/28/2021: vc to revisit and focus on grip and pinch strengthening with putty    Time 6    Period Weeks    Status On-going    Target Date 03/10/21               OT Long Term Goals - 02/11/21 1635       OT LONG TERM GOAL #1   Title Pt will improve hand writing to 100% legibility with R hand to be able to independently write a check.    Baseline Eval: signature is 75% legible with R hand, requiring extra time; 12/04/2020: no change from eval; 01/05/2021: Printing is 100% legible, signature is 90% legible, but still requires extra time and effort.; 01/26/21: Signature is 90% legible and speed/fluidity have improved. 20th visit: 90% legible.    Time 12    Period Weeks    Status On-going    Target Date 04/21/21      OT LONG TERM GOAL #2   Title Pt will improve R hand coordination to enable good manipulation of iphone using R dominant hand    Baseline Eval: Pt uses L hand d/t R hand lacking sufficient FMC to press buttons on phone; 12/04/2020: no change from eval; 01/05/2021: R hand coordination is improving but pt still uses L hand to dial phone; 01/28/21: Pt using R dominant hand to press buttons and apps on phone 50% of the time.20th visit: Right hand [facility] skilols are improving, Pt. is improving with manipulation of the iphone.    Time 12    Period Weeks    Status On-going    Target Date 04/21/21      OT LONG TERM GOAL #3   Title Pt will improve GMC throughout RUE to enable pt to reach for and pick up ADL supplies with R dominant hand without dropping or knocking over objects.     Baseline Eval: Pt reports RUE is clumsy and easily knocks over objects when trying to reach for ADL supplies.  Pt verbalizes that her "aim is off.";  12/04/2020: pt reports slight improvement since eval and has started using her R hand to eat, but still feels clumsy; 01/05/2021: Greatly improved; pt is using silverware to eat with R hand, can comb hair with RUE, still mildly ataxic; 01/26/21: pt reports difficulty picking up objects within a small space (ie; may knock the toothpaste holder down when reaching for toothbrush), but reaching for light/larger objects is improving 20th visit: pt. continues to be mildly ataxic, however is consistently improving with motor control, and FMC while reaching targets.    Time 12    Period Weeks    Status On-going    Target Date 04/21/21      OT LONG TERM GOAL #4   Title Pt will improve FOTO score to 68 or better to indicate measurable functional improvement.    Baseline Eval: FOTO 55; 01/05/2021: FOTO 61; 01/28/21: FOTO 61, 20th visit: 61    Time 12    Period Weeks    Status On-going    Target Date 04/21/21                   Plan - 02/11/21 1701     Clinical Impression Statement Measurements were obtained, and goals were reviewed with the pt.  Pt. has made excellent progress overall with right UE motor control, and [facility] skills. Pt. Has improved with right UE grip strength, pinch strength, and FMC skills. Pt. has improved with speed, and accuracy with reaching for targets. Pt. Continues to work on improving RUE strength, motor control, and [facility] skills in order to improve accuracy with writing legibility, iphone use, and overall engagement during ADLs, and IADL tasks.    OT Occupational Profile and History Detailed Assessment- Review of Records and additional review of physical, cognitive, psychosocial history related to current functional performance    Occupational performance deficits (Please refer to evaluation for details): ADL's;Leisure;IADL's    Body  Structure / Function / Physical Skills ADL;Coordination;Endurance;GMC;UE functional use;Balance;Sensation;Body mechanics;IADL;Pain;Dexterity;FMC;Strength;Gait;Mobility    Rehab Potential Good    Clinical Decision Making Several treatment options, min-mod task modification necessary    Comorbidities Affecting Occupational Performance: May have comorbidities impacting occupational performance    Modification or Assistance to Complete Evaluation  Min-Moderate modification of tasks or assist with assess necessary to complete eval    OT Frequency 2x / week    OT Duration 12 weeks    OT Treatment/Interventions Self-care/ADL training;Therapeutic exercise;DME and/or AE instruction;Functional Mobility Training;Balance training;Neuromuscular education;Therapeutic activities;Patient/family education    Consulted and Agree with Plan of Care Patient             Patient will benefit from skilled therapeutic intervention in order to improve the following deficits and impairments:   Body Structure / Function / Physical Skills: ADL, Coordination, Endurance, GMC, UE functional use, Balance, Sensation, Body mechanics, IADL, Pain, Dexterity, FMC, Strength, Gait, Mobility       Visit Diagnosis: Muscle weakness (generalized)  Other lack of coordination    Problem List Patient Active Problem List   Diagnosis Date Noted   Carotid stenosis, symptomatic, with infarction (HCC) 11/13/2020   Gout flare 11/10/2020   UTI (urinary tract infection) 11/10/2020   Left carotid artery stenosis 11/10/2020   Chronic kidney disease 11/10/2020   Left basal ganglia embolic stroke (HCC) 10/24/2020   PAC (premature atrial contraction)    Pre-procedural cardiovascular examination    Ischemic stroke (HCC) 10/20/2020   TIA (transient ischemic attack) 10/19/2020   Right hemiparesis (HCC) 10/19/2020   Type 2 diabetes mellitus  with stage 3 chronic kidney disease, without long-term current use of insulin (HCC) 08/24/2018    Hyperlipidemia, unspecified 07/19/2017   Hypertension 07/19/2017   PUD (peptic ulcer disease) 07/19/2017   Type 2 diabetes mellitus (HCC) 07/19/2017   Personal history of gout 08/12/2016   Anemia, unspecified 04/09/2016   Hypothyroidism, acquired 12/10/2015    Olegario Messier, MS, OTR/L 02/11/2021, 5:19 PM  [facility] MAIN [facility] SERVICES [address] [place], [place], [address] Phone: [phone]   Fax:  [phone]  Name: Donzella Carrol MRN: [id number] Date of Birth: 04/07/1929

## 2021-02-16 ENCOUNTER — Ambulatory Visit: Payer: Medicare HMO

## 2021-02-16 ENCOUNTER — Other Ambulatory Visit: Payer: Self-pay

## 2021-02-16 ENCOUNTER — Ambulatory Visit: Payer: Medicare HMO | Admitting: Physical Therapy

## 2021-02-16 ENCOUNTER — Encounter: Payer: Medicare HMO | Admitting: Speech Pathology

## 2021-02-16 DIAGNOSIS — R262 Difficulty in walking, not elsewhere classified: Secondary | ICD-10-CM

## 2021-02-16 DIAGNOSIS — M6281 Muscle weakness (generalized): Secondary | ICD-10-CM

## 2021-02-16 DIAGNOSIS — R278 Other lack of coordination: Secondary | ICD-10-CM

## 2021-02-16 DIAGNOSIS — R482 Apraxia: Secondary | ICD-10-CM

## 2021-02-16 DIAGNOSIS — R269 Unspecified abnormalities of gait and mobility: Secondary | ICD-10-CM

## 2021-02-16 DIAGNOSIS — R2681 Unsteadiness on feet: Secondary | ICD-10-CM

## 2021-02-16 DIAGNOSIS — R2689 Other abnormalities of gait and mobility: Secondary | ICD-10-CM

## 2021-02-16 NOTE — Therapy (Signed)
[facility] MAIN [facility] SERVICES [address] [place], [place], [address] Phone: [phone]   Fax:  [phone]  Occupational Therapy Treatment  Patient Details  Name: Cassandra Schaefer MRN: [id number] Date of Birth: 08-15-1928 Referring Provider (OT): Dr. Barbette Reichmann   Encounter Date: 02/16/2021   OT End of Session - 02/16/21 1611     Visit Number 21    Number of Visits 40    Date for OT Re-Evaluation 04/21/21    Authorization Time Period Reporting period starting 01/07/2021    OT Start Time 1100    OT Stop Time 1145    OT Time Calculation (min) 45 min    Equipment Utilized During Treatment tranport chair    Activity Tolerance Patient tolerated treatment well    Behavior During Therapy WFL for tasks assessed/performed             Past Medical History:  Diagnosis Date   Anemia    Cough    RESOLVING, NO FEVER   Diabetes mellitus without complication (HCC)    Gout    Hypertension    Hypothyroidism    PUD (peptic ulcer disease)    Stroke [facility])    Wears dentures    full upper, partial lower    Past Surgical History:  Procedure Laterality Date   AMPUTATION TOE Left 12/14/2017   Procedure: AMPUTATION TOE-4TH MPJ;  Surgeon: Gwyneth Revels, DPM;  Location: [facility];  Service: Podiatry;  Laterality: Left;  IVA LOCAL Diabetic - oral meds   APPENDECTOMY     CATARACT EXTRACTION W/PHACO Right 06/23/2015   Procedure: CATARACT EXTRACTION PHACO AND INTRAOCULAR LENS PLACEMENT (IOC);  Surgeon: Sallee Lange, MD;  Location: [facility];  Service: Ophthalmology;  Laterality: Right;  [place] 01:28 AP% 23.4 CDE 36.14 fluid pack lot # [id number]   CATARACT EXTRACTION W/PHACO Left 07/28/2015   Procedure: CATARACT EXTRACTION PHACO AND INTRAOCULAR LENS PLACEMENT (IOC);  Surgeon: Sallee Lange, MD;  Location: [facility];  Service: Ophthalmology;  Laterality: Left;  [place]    1:29.7 AP%  24.9 CDE   41.32 fluid casette lot #[id number] H exp  09/23/2016   COONOSCOPY     AND ENDOSCOPY   DILATION AND CURETTAGE OF UTERUS     ENDARTERECTOMY Left 11/13/2020   Procedure: Evacuation of left neck hematoma;  Surgeon: Renford Dills, MD;  Location: [facility];  Service: Vascular;  Laterality: Left;   ENDARTERECTOMY Left 11/13/2020   Procedure: ENDARTERECTOMY CAROTID;  Surgeon: Annice Needy, MD;  Location: [facility];  Service: Vascular;  Laterality: Left;   TONSILLECTOMY     TUBAL LIGATION      There were no vitals filed for this visit.   Subjective Assessment - 02/16/21 1608     Subjective  Pt reports that she alternated between use of R/L hands during her dominoe game so she wouldn't hold the game up, but she was pleased to be able to use her R hand some.    Patient is accompanied by: Family member    Pertinent History R ankle pain, gout, T2DM, HTN, CKDIII; R/L carotid endarectomy    Patient Stated Goals "I want to improve my aim when I'm using my R arm."    Currently in Pain? No/denies    Pain Score 0-No pain    Pain Onset In the past 7 days            Occupational Therapy Treatment: Therapeutic Exercise: Provided activities for R hand strengthening to improve manipulation of  ADL supplies.  Used light resistance hand gripper x 3 sets 10 R/L hands.  Used clothespins for pinch strengthening with OT providing vertical and horizontal targets to facilitate shoulder flexion and abduction.    Neuro re-ed: Placed jumbo pegs vertically on table top and pt worked to remove small beads within the group of pegs without knocking pegs over, placed beads atop pegs and worked to remove beads without tipping over pegs ~75% accuracy.  Practiced digit isolation rotating pegs in hand with minimal wrist mobility as to facilitate digit isolation, extra time needed.   Response to Treatment: See Plan/clinical impression below.    OT Education - 02/16/21 1610     Education Details GMC activities to target reaching with RUE    Person(s) Educated  Patient    Methods Explanation;Verbal cues;Demonstration    Comprehension Verbalized understanding;Returned demonstration              OT Short Term Goals - 01/28/21 1644       OT SHORT TERM GOAL #1   Title Pt will perform HEP for RUE strength/coordination independently.    Baseline Eval: HEP not yet established in outpatient, but pt is working with putty from [name]; 12/04/2020; [facility] HEP initiated this day with mod vc after return demo; 01/05/2021: indep with currently program, but ongoing as pt progresses; 01/28/2021: vc to revisit and focus on grip and pinch strengthening with putty    Time 6    Period Weeks    Status On-going    Target Date 03/10/21               OT Long Term Goals - 02/11/21 1635       OT LONG TERM GOAL #1   Title Pt will improve hand writing to 100% legibility with R hand to be able to independently write a check.    Baseline Eval: signature is 75% legible with R hand, requiring extra time; 12/04/2020: no change from eval; 01/05/2021: Printing is 100% legible, signature is 90% legible, but still requires extra time and effort.; 01/26/21: Signature is 90% legible and speed/fluidity have improved. 20th visit: 90% legible.    Time 12    Period Weeks    Status On-going    Target Date 04/21/21      OT LONG TERM GOAL #2   Title Pt will improve R hand coordination to enable good manipulation of iphone using R dominant hand    Baseline Eval: Pt uses L hand d/t R hand lacking sufficient FMC to press buttons on phone; 12/04/2020: no change from eval; 01/05/2021: R hand coordination is improving but pt still uses L hand to dial phone; 01/28/21: Pt using R dominant hand to press buttons and apps on phone 50% of the time.20th visit: Right hand [facility] skilols are improving, Pt. is improving with manipulation of the iphone.    Time 12    Period Weeks    Status On-going    Target Date 04/21/21      OT LONG TERM GOAL #3   Title Pt will improve GMC throughout RUE to enable pt to  reach for and pick up ADL supplies with R dominant hand without dropping or knocking over objects.    Baseline Eval: Pt reports RUE is clumsy and easily knocks over objects when trying to reach for ADL supplies.  Pt verbalizes that her "aim is off."; 12/04/2020: pt reports slight improvement since eval and has started using her R hand to eat, but still feels clumsy;  01/05/2021: Greatly improved; pt is using silverware to eat with R hand, can comb hair with RUE, still mildly ataxic; 01/26/21: pt reports difficulty picking up objects within a small space (ie; may knock the toothpaste holder down when reaching for toothbrush), but reaching for light/larger objects is improving 20th visit: pt. continues to be mildly ataxic, however is consistently improving with motor control, and FMC while reaching targets.    Time 12    Period Weeks    Status On-going    Target Date 04/21/21      OT LONG TERM GOAL #4   Title Pt will improve FOTO score to 68 or better to indicate measurable functional improvement.    Baseline Eval: FOTO 55; 01/05/2021: FOTO 61; 01/28/21: FOTO 61, 20th visit: 61    Time 12    Period Weeks    Status On-going    Target Date 04/21/21             Plan - 02/16/21 1630     Clinical Impression Statement Pt was pleased to report that she has played dominoes with her friends since last seen by this therapist and they plan to start up weekly games again now that pt is able to play and engage RUE.  Pt reported that she had to alternate using L hand as to not slow the game down, but overall pt was pleased with her performance.  Pt continues to develop increased RUE strength and coordination for better distal control when reaching for ADL supplies and when participating in leisure activities such as cards and dominoes.    OT Occupational Profile and History Detailed Assessment- Review of Records and additional review of physical, cognitive, psychosocial history related to current functional  performance    Occupational performance deficits (Please refer to evaluation for details): ADL's;Leisure;IADL's    Body Structure / Function / Physical Skills ADL;Coordination;Endurance;GMC;UE functional use;Balance;Sensation;Body mechanics;IADL;Pain;Dexterity;FMC;Strength;Gait;Mobility    Rehab Potential Good    Clinical Decision Making Several treatment options, min-mod task modification necessary    Comorbidities Affecting Occupational Performance: May have comorbidities impacting occupational performance    Modification or Assistance to Complete Evaluation  Min-Moderate modification of tasks or assist with assess necessary to complete eval    OT Frequency 2x / week    OT Duration 12 weeks    OT Treatment/Interventions Self-care/ADL training;Therapeutic exercise;DME and/or AE instruction;Functional Mobility Training;Balance training;Neuromuscular education;Therapeutic activities;Patient/family education    Consulted and Agree with Plan of Care Patient             Patient will benefit from skilled therapeutic intervention in order to improve the following deficits and impairments:   Body Structure / Function / Physical Skills: ADL, Coordination, Endurance, GMC, UE functional use, Balance, Sensation, Body mechanics, IADL, Pain, Dexterity, FMC, Strength, Gait, Mobility       Visit Diagnosis: Muscle weakness (generalized)  Other lack of coordination  Apraxia    Problem List Patient Active Problem List   Diagnosis Date Noted   Carotid stenosis, symptomatic, with infarction (HCC) 11/13/2020   Gout flare 11/10/2020   UTI (urinary tract infection) 11/10/2020   Left carotid artery stenosis 11/10/2020   Chronic kidney disease 11/10/2020   Left basal ganglia embolic stroke (HCC) 10/24/2020   PAC (premature atrial contraction)    Pre-procedural cardiovascular examination    Ischemic stroke (HCC) 10/20/2020   TIA (transient ischemic attack) 10/19/2020   Right hemiparesis (HCC)  10/19/2020   Type 2 diabetes mellitus with stage 3 chronic kidney disease, without long-term current  use of insulin (HCC) 08/24/2018   Hyperlipidemia, unspecified 07/19/2017   Hypertension 07/19/2017   PUD (peptic ulcer disease) 07/19/2017   Type 2 diabetes mellitus (HCC) 07/19/2017   Personal history of gout 08/12/2016   Anemia, unspecified 04/09/2016   Hypothyroidism, acquired 12/10/2015   Danelle Earthly, MS, OTR/L  Otis Dials, OT/L 02/16/2021, 4:31 PM  [facility] MAIN [facility] SERVICES [address] [place], [place], [address] Phone: [phone]   Fax:  [phone]  Name: Cassandra Schaefer MRN: [id number] Date of Birth: Dec 14, 1928

## 2021-02-16 NOTE — Therapy (Signed)
[place] MAIN [facility] SERVICES [address] [place], [place], [address] Phone: [phone]   Fax:  [phone]  Physical Therapy Treatment  Patient Details  Name: Cassandra Schaefer MRN: [id number] Date of Birth: Sep 17, 1928 Referring Provider (PT): Dr. Dew/Dr. Ginette Pitman PCP   Encounter Date: 02/16/2021   PT End of Session - 02/16/21 1246     Visit Number 18    Number of Visits 38    Date for PT Re-Evaluation 04/27/21    Authorization Type [employer]    Authorization Time Period 02/02/21-04/27/21    Progress Note Due on Visit 20    PT Start Time 1146    PT Stop Time 1229    PT Time Calculation (min) 43 min    Equipment Utilized During Treatment Gait belt    Activity Tolerance Patient tolerated treatment well;No increased pain    Behavior During Therapy WFL for tasks assessed/performed             Past Medical History:  Diagnosis Date   Anemia    Cough    RESOLVING, NO FEVER   Diabetes mellitus without complication ([place])    Gout    Hypertension    Hypothyroidism    PUD (peptic ulcer disease)    Stroke [facility])    Wears dentures    full upper, partial lower    Past Surgical History:  Procedure Laterality Date   AMPUTATION TOE Left 12/14/2017   Procedure: AMPUTATION TOE-4TH MPJ;  Surgeon: Samara Deist, DPM;  Location: [place];  Service: Podiatry;  Laterality: Left;  IVA LOCAL Diabetic - oral meds   APPENDECTOMY     CATARACT EXTRACTION W/PHACO Right 06/23/2015   Procedure: CATARACT EXTRACTION PHACO AND INTRAOCULAR LENS PLACEMENT (IOC);  Surgeon: Estill Cotta, MD;  Location: [facility];  Service: Ophthalmology;  Laterality: Right;  [place] 01:28 AP% 23.4 CDE 36.14 fluid pack lot # [id number]   CATARACT EXTRACTION W/PHACO Left 07/28/2015   Procedure: CATARACT EXTRACTION PHACO AND INTRAOCULAR LENS PLACEMENT (IOC);  Surgeon: Estill Cotta, MD;  Location: [facility];  Service: Ophthalmology;  Laterality: Left;  [place]     1:29.7 AP%  24.9 CDE   41.32 fluid casette lot #[id number] H exp 09/23/2016   COONOSCOPY     AND ENDOSCOPY   DILATION AND CURETTAGE OF UTERUS     ENDARTERECTOMY Left 11/13/2020   Procedure: Evacuation of left neck hematoma;  Surgeon: Katha Cabal, MD;  Location: [facility];  Service: Vascular;  Laterality: Left;   ENDARTERECTOMY Left 11/13/2020   Procedure: ENDARTERECTOMY CAROTID;  Surgeon: Algernon Huxley, MD;  Location: [facility];  Service: Vascular;  Laterality: Left;   TONSILLECTOMY     TUBAL LIGATION      There were no vitals filed for this visit.   Subjective Assessment - 02/16/21 1245     Subjective States she is doing well today. No falls or LOB since previous session. Denies pain.    Pertinent History 87 y.o. female with medical history significant for type 2 diabetes mellitus, essential hypertension, acquired hypothyroidism, hyperlipidemia, stage III chronic kidney disease reports increased right sided weakness on 10/19/20. She was diagnosed with left basal ganglia ischemic stroke. She was discharged to inpatient rehab for 10 days and then discharged home. Patient was evaluated by outpatient PT on 11/12/20. CVA was due to carotid stenosis. She underwent left carotid endartectomy on 7/21. Procedure went well however patient suffered from left cervical hematoma and had subsequent surgical procedure to stop the bleed  and remove the hematoma on 11/13/20. They were concerned with her breathing and therefore intubated her for 1 day, she was in ICU for 2 days and transferred to med/surge on 11/15/20. Patient was discharged home on 11/17/20 and is now returning to outpatient PT. She has had the stiches removed and is healing well. Denies any neck pain or stiffness. She lives with her daughter and has 24/7 caregiver support. She is still using RW for most ambulation. She is able to transfer to bedside commode well and is able to stand short time unsupported. She reports feeling very weak and fatigued. She  reports she has been dragging her right foot more. She denies any numbness/tingling; She reports feeling like her legs are wanting to do more but is hesitant because of fear of falling. She is mod I for self care morning routine. She does still receive help with showering. She has been working on increasing her activity but denies any formal exercise.    Currently in Pain? No/denies                INTERVENTION THIS DATE:  -seated LAQ, 2# AW with 5 second hold; 2x10 BLE  Standing at bar, no additional weight: - hip extension, 2x15 BLE - marching, 2x15 BLE VC and TC on body mechanics to target primary muscles  -Side stepping over 6" hurdle, 2 x 10 BLE    -STS, 2x8, no UE support   -OVERGROUND AMB with rollator 321f, cues for R toe clearance in 2nd lap, break taken    -2 minute seated recovery interval   - seated RLE coordination with cognitive task - toe taps to hedge hog with PT calling out sequencing.       Clinical Impression Pt presents with excellent motivation throughout today's PT session. Pt was progressed with additional exercises, particularly performed in standing. Pt presented with difficulty coordinating and placing right step during hurdle activity. PT challenged pt with RLE coordination task performed in sitting due to fatigue by end of session. Pt was able to perform RLE movements with improved coordination and accuracy in sitting; she was challenged by cognitive component of sequencing. Rest breaks were required throughout due to muscular fatigue and SOB. Pt will continue to benefit from skilled PT services to address deficits and impairment identified in evaluation in order to maximize independence and safety in basic mobility required for performance of ADL, IADL, and leisure.           PT Short Term Goals - 02/04/21 1306       PT SHORT TERM GOAL #1   Title Patient will be adherent to HEP at least 3x a week to improve functional strength and balance for  better safety at home.    Baseline 9/21: Pt reports compliance with HEP and is confident    Time 4    Period Weeks    Status Achieved    Target Date 12/31/20      PT SHORT TERM GOAL #2   Title Patient will be independent in transferring sit<>Stand without pushing on arm rests to improve ability to get up from chair.    Baseline 9/21: pt able to complete STS without use of UEs    Time 4    Period Weeks    Status Achieved    Target Date 12/31/20               PT Long Term Goals - 02/04/21 1307       PT LONG  TERM GOAL #1   Title Patient (> 24 years old) will complete five times sit to stand test in < 15 seconds indicating an increased LE strength and improved balance.    Baseline 9/21: 29 seconds hands-free 10/10: deferred 10/12: 34 sec hands free    Time 8    Period Weeks    Status On-going    Target Date 04/27/21      PT LONG TERM GOAL #2   Title Patient will increase six minute walk test distance to >400 feet for improve gait ability    Baseline 9/21: 368 ft with RW; 10/10: deferred 10/12: 408 ft    Time 8    Period Weeks    Status Achieved      PT LONG TERM GOAL #3   Title Patient will increase 10 meter walk test to >1.56ms as to improve gait speed for better community ambulation and to reduce fall risk.    Baseline 9/21: 0.5 m/s with RW; 10/10 deferred 10/12: 0.93 m/s with 4WW    Time 8    Period Weeks    Status Partially Met    Target Date 04/27/21      PT LONG TERM GOAL #4   Title Patient will be independent with ascend/descend 6 steps using single UE in step over step pattern without LOB.    Baseline 9/21:  Patient uses PT stairs (4 steps) with bilateral upper extremity support on handrails for both ascending/descending.  Patient exhibits reciprocal pattern ascending and step to pattern descending. 10/10: deferred; 10/12: ascending/descending recip. steps with BUE support    Time 8    Period Weeks    Status On-going    Target Date 04/27/21      PT LONG  TERM GOAL #5   Title Patient will demonstrate an improved Berg Balance Score of >45/56 as to demonstrate improved balance with ADLs such as sitting/standing and transfer balance and reduced fall risk.    Baseline 9/21: deferred d/t time; 9/29: 42/56; 10/10: deferred; 10/12: 44/56    Time 8    Period Weeks    Status Partially Met      PT LONG TERM GOAL #6   Title Patient will improve FOTO score to >60% to indicate improved functional mobility with ADLs.    Baseline 9/21: 59; 10/10: 57    Time 8    Period Weeks    Status Partially Met    Target Date 04/27/21                   Plan - 02/16/21 1247     Clinical Impression Statement Pt presents with excellent motivation throughout today's PT session. Pt was progressed with additional exercises, particularly performed in standing. Pt presented with difficulty coordinating and placing right step during hurdle activity. PT challenged pt with RLE coordination task performed in sitting due to fatigue by end of session. Pt was able to perform RLE movements with improved coordination and accuracy in sitting; she was challenged by cognitive component of sequencing. Rest breaks were required throughout due to muscular fatigue and SOB. Pt will continue to benefit from skilled PT services to address deficits and impairment identified in evaluation in order to maximize independence and safety in basic mobility required for performance of ADL, IADL, and leisure.    Personal Factors and Comorbidities Age    Comorbidities HTN, CKD, fall risk, type 2 diabetes,    Examination-Activity Limitations Lift;Locomotion Level;Squat;Stairs;Stand;Toileting;Transfers;Bathing    Examination-Participation Restrictions Cleaning;Community Activity;Laundry;Meal Prep;Shop;Volunteer;Driving  Stability/Clinical Decision Making Stable/Uncomplicated    Rehab Potential Poor    PT Frequency 2x / week    PT Duration 12 weeks    PT Treatment/Interventions  Cryotherapy;Electrical Stimulation;Moist Heat;Gait training;Stair training;Functional mobility training;Therapeutic activities;Therapeutic exercise;Neuromuscular re-education;Balance training;Patient/family education;Orthotic Fit/Training;Energy conservation    PT Next Visit Plan work on LE strengthening and balance; gait training, endurance    PT Home Exercise Plan Access Code: [email or web address]; no updates    Consulted and Agree with Plan of Care Patient             Patient will benefit from skilled therapeutic intervention in order to improve the following deficits and impairments:  Abnormal gait, Decreased balance, Decreased endurance, Decreased mobility, Difficulty walking, Cardiopulmonary status limiting activity, Decreased activity tolerance, Decreased coordination, Decreased strength  Visit Diagnosis: Abnormality of gait and mobility  Other lack of coordination  Difficulty in walking, not elsewhere classified  Unsteadiness on feet  Muscle weakness (generalized)  Other abnormalities of gait and mobility     Problem List Patient Active Problem List   Diagnosis Date Noted   Carotid stenosis, symptomatic, with infarction ([place]) 11/13/2020   Gout flare 11/10/2020   UTI (urinary tract infection) 11/10/2020   Left carotid artery stenosis 11/10/2020   Chronic kidney disease 11/10/2020   Left basal ganglia embolic stroke ([place]) 30/12/2328   PAC (premature atrial contraction)    Pre-procedural cardiovascular examination    Ischemic stroke ([place]) 10/20/2020   TIA (transient ischemic attack) 10/19/2020   Right hemiparesis ([place]) 10/19/2020   Type 2 diabetes mellitus with stage 3 chronic kidney disease, without long-term current use of insulin ([place]) 08/24/2018   Hyperlipidemia, unspecified 07/19/2017   Hypertension 07/19/2017   PUD (peptic ulcer disease) 07/19/2017   Type 2 diabetes mellitus ([place]) 07/19/2017   Personal history of gout 08/12/2016   Anemia, unspecified 04/09/2016    Hypothyroidism, acquired 12/10/2015    Patrina Levering PT, DPT  02/16/2021, 1:04 PM  [place] MAIN [facility] SERVICES [address] [place], [place], [address] Phone: [phone]   Fax:  [phone]  Name: Cassandra Schaefer MRN: [id number] Date of Birth: Jan 02, 1929

## 2021-02-18 ENCOUNTER — Encounter: Payer: Medicare HMO | Admitting: Speech Pathology

## 2021-02-18 ENCOUNTER — Other Ambulatory Visit: Payer: Self-pay

## 2021-02-18 ENCOUNTER — Ambulatory Visit: Payer: Medicare HMO

## 2021-02-18 DIAGNOSIS — R278 Other lack of coordination: Secondary | ICD-10-CM

## 2021-02-18 DIAGNOSIS — R2681 Unsteadiness on feet: Secondary | ICD-10-CM

## 2021-02-18 DIAGNOSIS — M6281 Muscle weakness (generalized): Secondary | ICD-10-CM | POA: Diagnosis not present

## 2021-02-18 DIAGNOSIS — R2689 Other abnormalities of gait and mobility: Secondary | ICD-10-CM

## 2021-02-18 NOTE — Therapy (Signed)
[place] MAIN [facility] SERVICES [address] [place], [place], [address] Phone: [phone]   Fax:  [phone]  Physical Therapy Treatment  Patient Details  Name: Cassandra Schaefer MRN: [id number] Date of Birth: 10/19/1928 Referring Provider (PT): Dr. Dew/Dr. Ginette Pitman PCP   Encounter Date: 02/18/2021   PT End of Session - 02/18/21 1417     Visit Number 19    Number of Visits 38    Date for PT Re-Evaluation 04/27/21    Authorization Type [employer]    Authorization Time Period 02/02/21-04/27/21    Progress Note Due on Visit 20    PT Start Time 1147    PT Stop Time 1230    PT Time Calculation (min) 43 min    Equipment Utilized During Treatment Gait belt    Activity Tolerance Patient tolerated treatment well;No increased pain    Behavior During Therapy WFL for tasks assessed/performed             Past Medical History:  Diagnosis Date   Anemia    Cough    RESOLVING, NO FEVER   Diabetes mellitus without complication ([place])    Gout    Hypertension    Hypothyroidism    PUD (peptic ulcer disease)    Stroke [facility])    Wears dentures    full upper, partial lower    Past Surgical History:  Procedure Laterality Date   AMPUTATION TOE Left 12/14/2017   Procedure: AMPUTATION TOE-4TH MPJ;  Surgeon: Samara Deist, DPM;  Location: [place];  Service: Podiatry;  Laterality: Left;  IVA LOCAL Diabetic - oral meds   APPENDECTOMY     CATARACT EXTRACTION W/PHACO Right 06/23/2015   Procedure: CATARACT EXTRACTION PHACO AND INTRAOCULAR LENS PLACEMENT (IOC);  Surgeon: Estill Cotta, MD;  Location: [facility];  Service: Ophthalmology;  Laterality: Right;  [place] 01:28 AP% 23.4 CDE 36.14 fluid pack lot # [id number]   CATARACT EXTRACTION W/PHACO Left 07/28/2015   Procedure: CATARACT EXTRACTION PHACO AND INTRAOCULAR LENS PLACEMENT (IOC);  Surgeon: Estill Cotta, MD;  Location: [facility];  Service: Ophthalmology;  Laterality: Left;  [place]     1:29.7 AP%  24.9 CDE   41.32 fluid casette lot #[id number] H exp 09/23/2016   COONOSCOPY     AND ENDOSCOPY   DILATION AND CURETTAGE OF UTERUS     ENDARTERECTOMY Left 11/13/2020   Procedure: Evacuation of left neck hematoma;  Surgeon: Katha Cabal, MD;  Location: [facility];  Service: Vascular;  Laterality: Left;   ENDARTERECTOMY Left 11/13/2020   Procedure: ENDARTERECTOMY CAROTID;  Surgeon: Algernon Huxley, MD;  Location: [facility];  Service: Vascular;  Laterality: Left;   TONSILLECTOMY     TUBAL LIGATION      There were no vitals filed for this visit.  Subjective Assessment - 02/18/21 1416     Subjective Pt reports no pain, falls or near falls. She reports some soreness since previous session and states it was a "good workout."    Patient is accompained by: Family member   Daughter, [name]   Pertinent History 87 y.o. female with medical history significant for type 2 diabetes mellitus, essential hypertension, acquired hypothyroidism, hyperlipidemia, stage III chronic kidney disease reports increased right sided weakness on 10/19/20. She was diagnosed with left basal ganglia ischemic stroke. She was discharged to inpatient rehab for 10 days and then discharged home. Patient was evaluated by outpatient PT on 11/12/20. CVA was due to carotid stenosis. She underwent left carotid endartectomy on 7/21. Procedure  went well however patient suffered from left cervical hematoma and had subsequent surgical procedure to stop the bleed and remove the hematoma on 11/13/20. They were concerned with her breathing and therefore intubated her for 1 day, she was in ICU for 2 days and transferred to med/surge on 11/15/20. Patient was discharged home on 11/17/20 and is now returning to outpatient PT. She has had the stiches removed and is healing well. Denies any neck pain or stiffness. She lives with her daughter and has 24/7 caregiver support. She is still using RW for most ambulation. She is able to transfer to bedside  commode well and is able to stand short time unsupported. She reports feeling very weak and fatigued. She reports she has been dragging her right foot more. She denies any numbness/tingling; She reports feeling like her legs are wanting to do more but is hesitant because of fear of falling. She is mod I for self care morning routine. She does still receive help with showering. She has been working on increasing her activity but denies any formal exercise.    Limitations Standing;Walking    How long can you sit comfortably? NA    How long can you stand comfortably? 30-60 min with support    How long can you walk comfortably? about 100 feet with RW, still limited to short distances;    Diagnostic tests MRI shows left basal ganglia infarct 10/21/20    Patient Stated Goals "Improve balance and walking and not need RW, get back to independent."    Currently in Pain? No/denies    Pain Onset In the past 7 days              Interventions  Sit<>stands: Warm-up round with pt using BUEs to assist 5x Progressed to hands-free: 3x5. Continued cuing for technique, including use of mirror to reduce knee valgus. CGA provided.  Ambulation for LE muscular and cardiorespiratory endurance with 4WW, CGA.  [id number] ft each.  Comments: addition of cuing for technique, increased step-length, proximity to walker, R heel-strike.    Agility ladder exercise to promote increased step length with 4WW. Pt cued for one LE per step for recip. Pattern. 5x through.   Step-length exercises over 10 meter track with 4WW, CGA:  Trial 1: 27 steps Trial 2: 21 steps Trial 3: 21 steps Trial 4: 23 steps Trial 5: 25 steps Comments: Initial ability to improve performance, however pt required more steps with increased fatigue.    LAQ with 3# AW on BLEs 3x12 each LE rates medium  Standing Marches with 3# AWs on BLEs 3x16 each, rates medium   Pt educated throughout session about proper posture and technique with exercises.  Improved exercise technique, movement at target joints, use of target muscles after min to mod verbal, visual, tactile cues.    PT Education - 02/18/21 1416     Education Details exercise technique, body mechanics with continued focus on STS technique (hands-free)    Person(s) Educated Patient    Methods Explanation;Demonstration;Verbal cues;Tactile cues    Comprehension Verbalized understanding;Returned demonstration;Need further instruction;Tactile cues required              PT Short Term Goals - 02/04/21 1306       PT SHORT TERM GOAL #1   Title Patient will be adherent to HEP at least 3x a week to improve functional strength and balance for better safety at home.    Baseline 9/21: Pt reports compliance with HEP and is confident  Time 4    Period Weeks    Status Achieved    Target Date 12/31/20      PT SHORT TERM GOAL #2   Title Patient will be independent in transferring sit<>Stand without pushing on arm rests to improve ability to get up from chair.    Baseline 9/21: pt able to complete STS without use of UEs    Time 4    Period Weeks    Status Achieved    Target Date 12/31/20               PT Long Term Goals - 02/04/21 1307       PT LONG TERM GOAL #1   Title Patient (> 29 years old) will complete five times sit to stand test in < 15 seconds indicating an increased LE strength and improved balance.    Baseline 9/21: 29 seconds hands-free 10/10: deferred 10/12: 34 sec hands free    Time 8    Period Weeks    Status On-going    Target Date 04/27/21      PT LONG TERM GOAL #2   Title Patient will increase six minute walk test distance to >400 feet for improve gait ability    Baseline 9/21: 368 ft with RW; 10/10: deferred 10/12: 408 ft    Time 8    Period Weeks    Status Achieved      PT LONG TERM GOAL #3   Title Patient will increase 10 meter walk test to >1.40ms as to improve gait speed for better community ambulation and to reduce fall risk.     Baseline 9/21: 0.5 m/s with RW; 10/10 deferred 10/12: 0.93 m/s with 4WW    Time 8    Period Weeks    Status Partially Met    Target Date 04/27/21      PT LONG TERM GOAL #4   Title Patient will be independent with ascend/descend 6 steps using single UE in step over step pattern without LOB.    Baseline 9/21:  Patient uses PT stairs (4 steps) with bilateral upper extremity support on handrails for both ascending/descending.  Patient exhibits reciprocal pattern ascending and step to pattern descending. 10/10: deferred; 10/12: ascending/descending recip. steps with BUE support    Time 8    Period Weeks    Status On-going    Target Date 04/27/21      PT LONG TERM GOAL #5   Title Patient will demonstrate an improved Berg Balance Score of >45/56 as to demonstrate improved balance with ADLs such as sitting/standing and transfer balance and reduced fall risk.    Baseline 9/21: deferred d/t time; 9/29: 42/56; 10/10: deferred; 10/12: 44/56    Time 8    Period Weeks    Status Partially Met      PT LONG TERM GOAL #6   Title Patient will improve FOTO score to >60% to indicate improved functional mobility with ADLs.    Baseline 9/21: 59; 10/10: 57    Time 8    Period Weeks    Status Partially Met    Target Date 04/27/21                   Plan - 02/18/21 1417     Clinical Impression Statement Pt continues to exhibit excellent motivation to participate in PT. Pt does require some continued cuing for STS technique, after which she was able to demo multiple reps hands-free with decreased knee valgus. Part of session  focused on interventions for improvement in step-length. As pt increased step-length, RLE heel-strike improved and foot drag decreased. The pt will benefit from further skilled PT to continue to improve strength, endurance, balance and gait to maximize independence and safety with ADLs.    Personal Factors and Comorbidities Age    Comorbidities HTN, CKD, fall risk, type 2  diabetes,    Examination-Activity Limitations Lift;Locomotion Level;Squat;Stairs;Stand;Toileting;Transfers;Bathing    Examination-Participation Restrictions Cleaning;Community Activity;Laundry;Meal Prep;Shop;Volunteer;Driving    Stability/Clinical Decision Making Stable/Uncomplicated    Rehab Potential Poor    PT Frequency 2x / week    PT Duration 12 weeks    PT Treatment/Interventions Cryotherapy;Electrical Stimulation;Moist Heat;Gait training;Stair training;Functional mobility training;Therapeutic activities;Therapeutic exercise;Neuromuscular re-education;Balance training;Patient/family education;Orthotic Fit/Training;Energy conservation    PT Next Visit Plan work on LE strengthening and balance; gait training, endurance    PT Home Exercise Plan Access Code: [email or web address]; no updates    Consulted and Agree with Plan of Care Patient             Patient will benefit from skilled therapeutic intervention in order to improve the following deficits and impairments:  Abnormal gait, Decreased balance, Decreased endurance, Decreased mobility, Difficulty walking, Cardiopulmonary status limiting activity, Decreased activity tolerance, Decreased coordination, Decreased strength  Visit Diagnosis: Muscle weakness (generalized)  Unsteadiness on feet  Other abnormalities of gait and mobility  Other lack of coordination     Problem List Patient Active Problem List   Diagnosis Date Noted   Carotid stenosis, symptomatic, with infarction ([place]) 11/13/2020   Gout flare 11/10/2020   UTI (urinary tract infection) 11/10/2020   Left carotid artery stenosis 11/10/2020   Chronic kidney disease 11/10/2020   Left basal ganglia embolic stroke ([place]) 50/27/7412   PAC (premature atrial contraction)    Pre-procedural cardiovascular examination    Ischemic stroke ([place]) 10/20/2020   TIA (transient ischemic attack) 10/19/2020   Right hemiparesis ([place]) 10/19/2020   Type 2 diabetes mellitus with stage 3 chronic  kidney disease, without long-term current use of insulin ([place]) 08/24/2018   Hyperlipidemia, unspecified 07/19/2017   Hypertension 07/19/2017   PUD (peptic ulcer disease) 07/19/2017   Type 2 diabetes mellitus ([place]) 07/19/2017   Personal history of gout 08/12/2016   Anemia, unspecified 04/09/2016   Hypothyroidism, acquired 12/10/2015    Zollie Pee, PT 02/18/2021, 2:25 PM  [place] MAIN [facility] SERVICES [address] [place], [place], [address] Phone: [phone]   Fax:  [phone]  Name: Cassandra Schaefer MRN: [id number] Date of Birth: 23-Mar-1929

## 2021-02-19 NOTE — Therapy (Signed)
[place] [facility] MAIN [facility] SERVICES [address] [place], [place], [address] Phone: [phone]   Fax:  [phone]  Occupational Therapy Treatment  Patient Details  Name: Cassandra Schaefer MRN: [id number] Date of Birth: 1929/04/21 Referring Provider (OT): Dr. Barbette Reichmann   Encounter Date: 02/18/2021   OT End of Session - 02/19/21 1041     Visit Number 22    Number of Visits 40    Date for OT Re-Evaluation 04/21/21    Authorization Time Period Reporting period starting 01/07/2021    OT Start Time 1100    OT Stop Time 1145    OT Time Calculation (min) 45 min    Equipment Utilized During Treatment tranport chair    Activity Tolerance Patient tolerated treatment well    Behavior During Therapy WFL for tasks assessed/performed             Past Medical History:  Diagnosis Date   Anemia    Cough    RESOLVING, NO FEVER   Diabetes mellitus without complication ([facility])    Gout    Hypertension    Hypothyroidism    PUD (peptic ulcer disease)    Stroke [facility])    Wears dentures    full upper, partial lower    Past Surgical History:  Procedure Laterality Date   AMPUTATION TOE Left 12/14/2017   Procedure: AMPUTATION TOE-4TH MPJ;  Surgeon: Gwyneth Revels, DPM;  Location: [facility];  Service: Podiatry;  Laterality: Left;  IVA LOCAL Diabetic - oral meds   APPENDECTOMY     CATARACT EXTRACTION W/PHACO Right 06/23/2015   Procedure: CATARACT EXTRACTION PHACO AND INTRAOCULAR LENS PLACEMENT (IOC);  Surgeon: Sallee Lange, MD;  Location: [facility];  Service: Ophthalmology;  Laterality: Right;  [place] 01:28 AP% 23.4 CDE 36.14 fluid pack lot # [id number]   CATARACT EXTRACTION W/PHACO Left 07/28/2015   Procedure: CATARACT EXTRACTION PHACO AND INTRAOCULAR LENS PLACEMENT (IOC);  Surgeon: Sallee Lange, MD;  Location: [facility];  Service: Ophthalmology;  Laterality: Left;  [place]    1:29.7 AP%  24.9 CDE   41.32 fluid casette lot #[id number] H exp  09/23/2016   COONOSCOPY     AND ENDOSCOPY   DILATION AND CURETTAGE OF UTERUS     ENDARTERECTOMY Left 11/13/2020   Procedure: Evacuation of left neck hematoma;  Surgeon: Renford Dills, MD;  Location: [facility];  Service: Vascular;  Laterality: Left;   ENDARTERECTOMY Left 11/13/2020   Procedure: ENDARTERECTOMY CAROTID;  Surgeon: Annice Needy, MD;  Location: [facility];  Service: Vascular;  Laterality: Left;   TONSILLECTOMY     TUBAL LIGATION      There were no vitals filed for this visit.   Subjective Assessment - 02/18/21 1039     Subjective  Pt reports doing well today and continued improvements in use of her R hand at home.    Patient is accompanied by: Family member    Pertinent History R ankle pain, gout, T2DM, HTN, CKDIII; R/L carotid endarectomy    Patient Stated Goals "I want to improve my aim when I'm using my R arm."    Currently in Pain? No/denies    Pain Score 0-No pain    Pain Onset In the past 7 days            Occupational Therapy Treatment: Neuro re-ed: Practiced reaching toward target and precision of movements working with jumbo pegs, placing stones atop upright pegs, moving stones between pegs with goal to avoid knocking down pegs  and stones.  OT adjusted placement of pegs to widen forward and lateral reaching to increase difficulty and distal control.   Therapeutic Exercise: Participated in pinch strengthening for R hand using therapy resistant clothespins, clipping onto horizontal and vertical dowel to facilitate forward flexion and abduction of R shoulder; repeated attempts to clip black pin (most resistant).  Practiced distal coordination with pt cued to rest elbow on table top to target coordinated forearm pron/sup with simultaneous pinch to rotate clothespins 180 degrees on horizontal dowel; extra time to perform controlled movement.   Response to Treatment: Pt continues to develop motor control throughout RUE.  Pt reports she continues to improve with  picking up small objects from the table.  Pt is improving with distal control with GMC activities, though continues to knock over near items (ie pegs) when reaching for items close together on table top.  Pt reports she has to use caution when putting away or reaching for dishes.  Pt will continue to benefit from skilled OT for increasing RUE strength and coordination in order to improve efficiency and accuracy with reaching for and manipulating ADL supplies.     OT Education - 02/18/21 1040     Education Details body mechanics to minimize use of accessory muscles while reaching    Person(s) Educated Patient    Methods Explanation;Verbal cues;Demonstration    Comprehension Verbalized understanding;Returned demonstration              OT Short Term Goals - 01/28/21 1644       OT SHORT TERM GOAL #1   Title Pt will perform HEP for RUE strength/coordination independently.    Baseline Eval: HEP not yet established in outpatient, but pt is working with putty from [name]; 12/04/2020; [facility] HEP initiated this day with mod vc after return demo; 01/05/2021: indep with currently program, but ongoing as pt progresses; 01/28/2021: vc to revisit and focus on grip and pinch strengthening with putty    Time 6    Period Weeks    Status On-going    Target Date 03/10/21               OT Long Term Goals - 02/11/21 1635       OT LONG TERM GOAL #1   Title Pt will improve hand writing to 100% legibility with R hand to be able to independently write a check.    Baseline Eval: signature is 75% legible with R hand, requiring extra time; 12/04/2020: no change from eval; 01/05/2021: Printing is 100% legible, signature is 90% legible, but still requires extra time and effort.; 01/26/21: Signature is 90% legible and speed/fluidity have improved. 20th visit: 90% legible.    Time 12    Period Weeks    Status On-going    Target Date 04/21/21      OT LONG TERM GOAL #2   Title Pt will improve R hand coordination to  enable good manipulation of iphone using R dominant hand    Baseline Eval: Pt uses L hand d/t R hand lacking sufficient FMC to press buttons on phone; 12/04/2020: no change from eval; 01/05/2021: R hand coordination is improving but pt still uses L hand to dial phone; 01/28/21: Pt using R dominant hand to press buttons and apps on phone 50% of the time.20th visit: Right hand [facility] skilols are improving, Pt. is improving with manipulation of the iphone.    Time 12    Period Weeks    Status On-going    Target  Date 04/21/21      OT LONG TERM GOAL #3   Title Pt will improve GMC throughout RUE to enable pt to reach for and pick up ADL supplies with R dominant hand without dropping or knocking over objects.    Baseline Eval: Pt reports RUE is clumsy and easily knocks over objects when trying to reach for ADL supplies.  Pt verbalizes that her "aim is off."; 12/04/2020: pt reports slight improvement since eval and has started using her R hand to eat, but still feels clumsy; 01/05/2021: Greatly improved; pt is using silverware to eat with R hand, can comb hair with RUE, still mildly ataxic; 01/26/21: pt reports difficulty picking up objects within a small space (ie; may knock the toothpaste holder down when reaching for toothbrush), but reaching for light/larger objects is improving 20th visit: pt. continues to be mildly ataxic, however is consistently improving with motor control, and FMC while reaching targets.    Time 12    Period Weeks    Status On-going    Target Date 04/21/21      OT LONG TERM GOAL #4   Title Pt will improve FOTO score to 68 or better to indicate measurable functional improvement.    Baseline Eval: FOTO 55; 01/05/2021: FOTO 61; 01/28/21: FOTO 61, 20th visit: 61    Time 12    Period Weeks    Status On-going    Target Date 04/21/21               Plan - 02/18/21 1100     Clinical Impression Statement Pt continues to develop motor control throughout RUE.  Pt reports she continues to  improve with picking up small objects from the table.  Pt is improving with distal control with GMC activities, though continues to knock over near items (ie pegs) when reaching for items close together on table top.  Pt reports she has to use caution when putting away or reaching for dishes.  Pt will continue to benefit from skilled OT for increasing RUE strength and coordination in order to improve efficiency and accuracy with reaching for and manipulating ADL supplies.    OT Occupational Profile and History Detailed Assessment- Review of Records and additional review of physical, cognitive, psychosocial history related to current functional performance    Occupational performance deficits (Please refer to evaluation for details): ADL's;Leisure;IADL's    Body Structure / Function / Physical Skills ADL;Coordination;Endurance;GMC;UE functional use;Balance;Sensation;Body mechanics;IADL;Pain;Dexterity;FMC;Strength;Gait;Mobility    Rehab Potential Good    Clinical Decision Making Several treatment options, min-mod task modification necessary    Comorbidities Affecting Occupational Performance: May have comorbidities impacting occupational performance    Modification or Assistance to Complete Evaluation  Min-Moderate modification of tasks or assist with assess necessary to complete eval    OT Frequency 2x / week    OT Duration 12 weeks    OT Treatment/Interventions Self-care/ADL training;Therapeutic exercise;DME and/or AE instruction;Functional Mobility Training;Balance training;Neuromuscular education;Therapeutic activities;Patient/family education    Consulted and Agree with Plan of Care Patient             Patient will benefit from skilled therapeutic intervention in order to improve the following deficits and impairments:   Body Structure / Function / Physical Skills: ADL, Coordination, Endurance, GMC, UE functional use, Balance, Sensation, Body mechanics, IADL, Pain, Dexterity, FMC, Strength,  Gait, Mobility       Visit Diagnosis: Muscle weakness (generalized)  Other lack of coordination    Problem List Patient Active Problem List  Diagnosis Date Noted   Carotid stenosis, symptomatic, with infarction (HCC) 11/13/2020   Gout flare 11/10/2020   UTI (urinary tract infection) 11/10/2020   Left carotid artery stenosis 11/10/2020   Chronic kidney disease 11/10/2020   Left basal ganglia embolic stroke (HCC) 10/24/2020   PAC (premature atrial contraction)    Pre-procedural cardiovascular examination    Ischemic stroke (HCC) 10/20/2020   TIA (transient ischemic attack) 10/19/2020   Right hemiparesis (HCC) 10/19/2020   Type 2 diabetes mellitus with stage 3 chronic kidney disease, without long-term current use of insulin ([facility]) 08/24/2018   Hyperlipidemia, unspecified 07/19/2017   Hypertension 07/19/2017   PUD (peptic ulcer disease) 07/19/2017   Type 2 diabetes mellitus (HCC) 07/19/2017   Personal history of gout 08/12/2016   Anemia, unspecified 04/09/2016   Hypothyroidism, acquired 12/10/2015   Danelle Earthly, MS, OTR/L  Otis Dials, OT/L 02/19/2021, 11:01 AM  [place] [facility] MAIN [facility] SERVICES [address] [place], [place], [address] Phone: [phone]   Fax:  [phone]  Name: Cassandra Schaefer MRN: [id number] Date of Birth: Oct 01, 1928

## 2021-02-23 ENCOUNTER — Ambulatory Visit
Admission: RE | Admit: 2021-02-23 | Discharge: 2021-02-23 | Disposition: A | Discharge status: Home / Self Care | Payer: Medicare HMO | Source: Ambulatory Visit | Attending: Internal Medicine | Admitting: Internal Medicine

## 2021-02-23 DIAGNOSIS — Z1231 Encounter for screening mammogram for malignant neoplasm of breast: Secondary | ICD-10-CM | POA: Insufficient documentation

## 2021-02-25 ENCOUNTER — Encounter: Payer: Medicare HMO | Admitting: Speech Pathology

## 2021-02-25 ENCOUNTER — Other Ambulatory Visit: Payer: Self-pay

## 2021-02-25 ENCOUNTER — Ambulatory Visit: Payer: Medicare HMO | Attending: Internal Medicine

## 2021-02-25 ENCOUNTER — Ambulatory Visit: Payer: Medicare HMO

## 2021-02-25 DIAGNOSIS — R269 Unspecified abnormalities of gait and mobility: Secondary | ICD-10-CM | POA: Insufficient documentation

## 2021-02-25 DIAGNOSIS — R2689 Other abnormalities of gait and mobility: Secondary | ICD-10-CM | POA: Insufficient documentation

## 2021-02-25 DIAGNOSIS — R482 Apraxia: Secondary | ICD-10-CM | POA: Insufficient documentation

## 2021-02-25 DIAGNOSIS — R278 Other lack of coordination: Secondary | ICD-10-CM | POA: Diagnosis present

## 2021-02-25 DIAGNOSIS — R262 Difficulty in walking, not elsewhere classified: Secondary | ICD-10-CM | POA: Insufficient documentation

## 2021-02-25 DIAGNOSIS — R2681 Unsteadiness on feet: Secondary | ICD-10-CM | POA: Insufficient documentation

## 2021-02-25 DIAGNOSIS — M6281 Muscle weakness (generalized): Secondary | ICD-10-CM

## 2021-02-25 NOTE — Therapy (Signed)
[place] MAIN [facility] SERVICES [address] [place], [place], [address] Phone: [phone]   Fax:  [phone]  Physical Therapy Treatment/Physical Therapy Progress Note   Dates of reporting period  01/14/2021   to   02/25/2021   Patient Details  Name: Cassandra Schaefer MRN: [id number] Date of Birth: November 09, 1928 Referring Provider (PT): Dr. Dew/Dr. Ginette Pitman PCP   Encounter Date: 02/25/2021   PT End of Session - 02/25/21 1734     Visit Number 20    Number of Visits 38    Date for PT Re-Evaluation 04/27/21    Authorization Type [employer]    Authorization Time Period 02/02/21-04/27/21    Progress Note Due on Visit 20    PT Start Time 1519    PT Stop Time 1558    PT Time Calculation (min) 39 min    Equipment Utilized During Treatment Gait belt    Activity Tolerance Patient tolerated treatment well;No increased pain    Behavior During Therapy WFL for tasks assessed/performed             Past Medical History:  Diagnosis Date   Anemia    Cough    RESOLVING, NO FEVER   Diabetes mellitus without complication ([place])    Gout    Hypertension    Hypothyroidism    PUD (peptic ulcer disease)    Stroke [facility])    Wears dentures    full upper, partial lower    Past Surgical History:  Procedure Laterality Date   AMPUTATION TOE Left 12/14/2017   Procedure: AMPUTATION TOE-4TH MPJ;  Surgeon: Samara Deist, DPM;  Location: [place];  Service: Podiatry;  Laterality: Left;  IVA LOCAL Diabetic - oral meds   APPENDECTOMY     CATARACT EXTRACTION W/PHACO Right 06/23/2015   Procedure: CATARACT EXTRACTION PHACO AND INTRAOCULAR LENS PLACEMENT (IOC);  Surgeon: Estill Cotta, MD;  Location: [facility];  Service: Ophthalmology;  Laterality: Right;  [place] 01:28 AP% 23.4 CDE 36.14 fluid pack lot # [id number]   CATARACT EXTRACTION W/PHACO Left 07/28/2015   Procedure: CATARACT EXTRACTION PHACO AND INTRAOCULAR LENS PLACEMENT (IOC);  Surgeon: Estill Cotta, MD;  Location: [facility];  Service: Ophthalmology;  Laterality: Left;  [place]    1:29.7 AP%  24.9 CDE   41.32 fluid casette lot #[id number] H exp 09/23/2016   COONOSCOPY     AND ENDOSCOPY   DILATION AND CURETTAGE OF UTERUS     ENDARTERECTOMY Left 11/13/2020   Procedure: Evacuation of left neck hematoma;  Surgeon: Katha Cabal, MD;  Location: [facility];  Service: Vascular;  Laterality: Left;   ENDARTERECTOMY Left 11/13/2020   Procedure: ENDARTERECTOMY CAROTID;  Surgeon: Algernon Huxley, MD;  Location: [facility];  Service: Vascular;  Laterality: Left;   TONSILLECTOMY     TUBAL LIGATION      There were no vitals filed for this visit.   Subjective Assessment - 02/25/21 1736     Subjective No major changes reported. Pt reports frequently practicing HEP and improvement in her mobility.    Patient is accompained by: Family member   Daughter, Cassandra Schaefer   Pertinent History 87 y.o. female with medical history significant for type 2 diabetes mellitus, essential hypertension, acquired hypothyroidism, hyperlipidemia, stage III chronic kidney disease reports increased right sided weakness on 10/19/20. She was diagnosed with left basal ganglia ischemic stroke. She was discharged to inpatient rehab for 10 days and then discharged home. Patient was evaluated by outpatient PT on 11/12/20. CVA was due  to carotid stenosis. She underwent left carotid endartectomy on 7/21. Procedure went well however patient suffered from left cervical hematoma and had subsequent surgical procedure to stop the bleed and remove the hematoma on 11/13/20. They were concerned with her breathing and therefore intubated her for 1 day, she was in ICU for 2 days and transferred to med/surge on 11/15/20. Patient was discharged home on 11/17/20 and is now returning to outpatient PT. She has had the stiches removed and is healing well. Denies any neck pain or stiffness. She lives with her daughter and has 24/7 caregiver support. She is still using RW  for most ambulation. She is able to transfer to bedside commode well and is able to stand short time unsupported. She reports feeling very weak and fatigued. She reports she has been dragging her right foot more. She denies any numbness/tingling; She reports feeling like her legs are wanting to do more but is hesitant because of fear of falling. She is mod I for self care morning routine. She does still receive help with showering. She has been working on increasing her activity but denies any formal exercise.    Limitations Standing;Walking    How long can you sit comfortably? NA    How long can you stand comfortably? 30-60 min with support    How long can you walk comfortably? about 100 feet with RW, still limited to short distances;    Diagnostic tests MRI shows left basal ganglia infarct 10/21/20    Patient Stated Goals "Improve balance and walking and not need RW, get back to independent."    Currently in Pain? No/denies    Pain Onset In the past 7 days            INTERVENTIONS - goals reassessed for progress note. See goal session for details.    FOTO: 60  (partially met)  STS warm-up with BUE support 5x STS warm-up hands-free 5x  5xSTS: 23.8 sec hands-free; rates medium (improved since last assessment)  6MWT: 476 ft with 4WW; rates medium (goal achieved)   Berg: deferred  10MWT; 0.83 m/s with 4WW (slight decrease from previous of 0.93 m/s).  Ascended/descended 8 steps total, reciprocal pattern ascending and step-to descending. Pt uses UUE support throughout with CGA assist.   Seated marches 2x20   Pt educated throughout session about proper posture and technique with exercises. Improved exercise technique, movement at target joints, use of target muscles after min to mod verbal, visual, tactile cues.     PT Short Term Goals - 02/25/21 1519       PT SHORT TERM GOAL #1   Title Patient will be adherent to HEP at least 3x a week to improve functional strength and balance  for better safety at home.    Baseline 9/21: Pt reports compliance with HEP and is confident    Time 4    Period Weeks    Status Achieved    Target Date 12/31/20      PT SHORT TERM GOAL #2   Title Patient will be independent in transferring sit<>Stand without pushing on arm rests to improve ability to get up from chair.    Baseline 9/21: pt able to complete STS without use of UEs    Time 4    Period Weeks    Status Achieved    Target Date 12/31/20               PT Long Term Goals - 02/25/21 1520  PT LONG TERM GOAL #1   Title Patient (> 27 years old) will complete five times sit to stand test in < 15 seconds indicating an increased LE strength and improved balance.    Baseline 9/21: 29 seconds hands-free 10/10: deferred 10/12: 34 sec hands free; 11/2: 23.8 sec hands-free    Time 8    Period Weeks    Status On-going    Target Date 04/27/21      PT LONG TERM GOAL #2   Title Patient will increase six minute walk test distance to >400 feet for improve gait ability    Baseline 9/21: 368 ft with RW; 10/10: deferred 10/12: 408 ft; 11/2: 476 ft with 4WW    Time 8    Period Weeks    Status Achieved      PT LONG TERM GOAL #3   Title Patient will increase 10 meter walk test to >1.75ms as to improve gait speed for better community ambulation and to reduce fall risk.    Baseline 9/21: 0.5 m/s with RW; 10/10 deferred 10/12: 0.93 m/s with 4WW; 11/2: 0.83 m/s with 4WW    Time 8    Period Weeks    Status Partially Met    Target Date 04/27/21      PT LONG TERM GOAL #4   Title Patient will be independent with ascend/descend 6 steps using single UE in step over step pattern without LOB.    Baseline 9/21:  Patient uses PT stairs (4 steps) with bilateral upper extremity support on handrails for both ascending/descending.  Patient exhibits reciprocal pattern ascending and step to pattern descending. 10/10: deferred; 10/12: ascending/descending recip. steps with BUE support; 11/2:  Ascended/descended 8 steps total, reciprocal pattern ascending and step-to descending. Pt uses UUE support throughout with CGA assist.    Time 8    Period Weeks    Status Partially Met    Target Date 04/27/21      PT LONG TERM GOAL #5   Title Patient will demonstrate an improved Berg Balance Score of >45/56 as to demonstrate improved balance with ADLs such as sitting/standing and transfer balance and reduced fall risk.    Baseline 9/21: deferred d/t time; 9/29: 42/56; 10/10: deferred; 10/12: 44/56; 11/2: deferred    Time 8    Period Weeks    Status Partially Met    Target Date 04/27/21      PT LONG TERM GOAL #6   Title Patient will improve FOTO score to >60% to indicate improved functional mobility with ADLs.    Baseline 9/21: 59; 10/10: 57; 11/2: 60    Time 8    Period Weeks    Status Partially Met    Target Date 04/27/21                   Plan - 02/25/21 1730     Clinical Impression Statement Goals reassessed for progress note. Pt making gains achieving 6MWT goal and partially meeting FOTO goal, indicating improved functional capacity/gait ability and QOL. Pt improved time on 5xSTS (23.8 sec hands-free), indicating improved BLE strength/power. Pt with slight decrease in 10MWT performance (0.83 m/s), but might have been impacted due to fatigue. Last, pt with progress with technique and stability ascending/descending stairs. Patient's condition has the potential to improve in response to therapy. Maximum improvement is yet to be obtained. The anticipated improvement is attainable and reasonable in a generally predictable time. The pt will benefit from further skilled PT to improve strength, gait, endurance  and balance to increase ease and safety wtih ADLs.    Personal Factors and Comorbidities Age    Comorbidities HTN, CKD, fall risk, type 2 diabetes,    Examination-Activity Limitations Lift;Locomotion Level;Squat;Stairs;Stand;Toileting;Transfers;Bathing     Examination-Participation Restrictions Cleaning;Community Activity;Laundry;Meal Prep;Shop;Volunteer;Driving    Stability/Clinical Decision Making Stable/Uncomplicated    Rehab Potential Poor    PT Frequency 2x / week    PT Duration 12 weeks    PT Treatment/Interventions Cryotherapy;Electrical Stimulation;Moist Heat;Gait training;Stair training;Functional mobility training;Therapeutic activities;Therapeutic exercise;Neuromuscular re-education;Balance training;Patient/family education;Orthotic Fit/Training;Energy conservation    PT Next Visit Plan work on LE strengthening and balance; gait training, endurance    PT Home Exercise Plan Access Code: [email or web address]; no updates    Consulted and Agree with Plan of Care Patient             Patient will benefit from skilled therapeutic intervention in order to improve the following deficits and impairments:  Abnormal gait, Decreased balance, Decreased endurance, Decreased mobility, Difficulty walking, Cardiopulmonary status limiting activity, Decreased activity tolerance, Decreased coordination, Decreased strength  Visit Diagnosis: Other abnormalities of gait and mobility  Muscle weakness (generalized)  Other lack of coordination  Unsteadiness on feet     Problem List Patient Active Problem List   Diagnosis Date Noted   Carotid stenosis, symptomatic, with infarction ([place]) 11/13/2020   Gout flare 11/10/2020   UTI (urinary tract infection) 11/10/2020   Left carotid artery stenosis 11/10/2020   Chronic kidney disease 11/10/2020   Left basal ganglia embolic stroke ([place]) 16/60/6301   PAC (premature atrial contraction)    Pre-procedural cardiovascular examination    Ischemic stroke ([place]) 10/20/2020   TIA (transient ischemic attack) 10/19/2020   Right hemiparesis ([place]) 10/19/2020   Type 2 diabetes mellitus with stage 3 chronic kidney disease, without long-term current use of insulin ([place]) 08/24/2018   Hyperlipidemia, unspecified 07/19/2017    Hypertension 07/19/2017   PUD (peptic ulcer disease) 07/19/2017   Type 2 diabetes mellitus ([place]) 07/19/2017   Personal history of gout 08/12/2016   Anemia, unspecified 04/09/2016   Hypothyroidism, acquired 12/10/2015    Zollie Pee, PT 02/25/2021, 5:37 PM  [place] MAIN [facility] SERVICES [address] [place], [place], [address] Phone: [phone]   Fax:  [phone]  Name: Idil Maslanka MRN: [id number] Date of Birth: 07/12/1928

## 2021-02-25 NOTE — Therapy (Signed)
[place] MAIN [facility] SERVICES [address] [place], [place], [address] Phone: [phone]   Fax:  [phone]  Occupational Therapy Treatment  Patient Details  Name: Cassandra Schaefer MRN: [id number] Date of Birth: 1928/06/09 Referring Provider (OT): Dr. Tracie Harrier   Encounter Date: 02/25/2021   OT End of Session - 02/25/21 1614     Visit Number 23    Number of Visits 40    Date for OT Re-Evaluation 04/21/21    Authorization Time Period Reporting period starting 01/07/2021    OT Start Time 1555    OT Stop Time 1640    OT Time Calculation (min) 45 min    Equipment Utilized During Treatment tranport chair    Activity Tolerance Patient tolerated treatment well    Behavior During Therapy WFL for tasks assessed/performed             Past Medical History:  Diagnosis Date   Anemia    Cough    RESOLVING, NO FEVER   Diabetes mellitus without complication ([place])    Gout    Hypertension    Hypothyroidism    PUD (peptic ulcer disease)    Stroke [facility])    Wears dentures    full upper, partial lower    Past Surgical History:  Procedure Laterality Date   AMPUTATION TOE Left 12/14/2017   Procedure: AMPUTATION TOE-4TH MPJ;  Surgeon: Samara Deist, DPM;  Location: [place];  Service: Podiatry;  Laterality: Left;  IVA LOCAL Diabetic - oral meds   APPENDECTOMY     CATARACT EXTRACTION W/PHACO Right 06/23/2015   Procedure: CATARACT EXTRACTION PHACO AND INTRAOCULAR LENS PLACEMENT (IOC);  Surgeon: Estill Cotta, MD;  Location: [facility];  Service: Ophthalmology;  Laterality: Right;  [place] 01:28 AP% 23.4 CDE 36.14 fluid pack lot # [id number]   CATARACT EXTRACTION W/PHACO Left 07/28/2015   Procedure: CATARACT EXTRACTION PHACO AND INTRAOCULAR LENS PLACEMENT (IOC);  Surgeon: Estill Cotta, MD;  Location: [facility];  Service: Ophthalmology;  Laterality: Left;  [place]    1:29.7 AP%  24.9 CDE   41.32 fluid casette lot [id number] H exp  09/23/2016   COONOSCOPY     AND ENDOSCOPY   DILATION AND CURETTAGE OF UTERUS     ENDARTERECTOMY Left 11/13/2020   Procedure: Evacuation of left neck hematoma;  Surgeon: Katha Cabal, MD;  Location: [facility];  Service: Vascular;  Laterality: Left;   ENDARTERECTOMY Left 11/13/2020   Procedure: ENDARTERECTOMY CAROTID;  Surgeon: Algernon Huxley, MD;  Location: [facility];  Service: Vascular;  Laterality: Left;   TONSILLECTOMY     TUBAL LIGATION      There were no vitals filed for this visit.   Subjective Assessment - 02/25/21 1611     Subjective  "It's coming along.  It's a slow process." (pt referring to use of her R arm for self care)    Patient is accompanied by: Family member    Pertinent History R ankle pain, gout, T2DM, HTN, CKDIII; R/L carotid endarectomy    Patient Stated Goals "I want to improve my aim when I'm using my R arm."    Currently in Pain? No/denies    Pain Score 0-No pain    Pain Onset In the past 7 days            Occupational Therapy Treatment: Neuro re-ed: Practiced reaching toward target and precision of movements working with jumbo pegs, placing and removing stones atop upright pegs without knocking over, picking up peg  with stone on top and moving to another location on table top without dropping stone.  Used marker board upright on tabletop to practice drawing with outstretched arm.  Completed tracing, drawing circles, x's, zig-zag lines within marked lined spaces using RUE to target GMC.    Therapeutic Exercise: Participated in pinch strengthening for R hand using therapy resistant clothespins, clipping onto horizontal and vertical dowel to facilitate forward flexion; repeated attempts to clip black pin (most resistant).    Response to Treatment: See Plan/clinical impression below.    OT Education - 02/25/21 1612     Education Details coordination exercises    Person(s) Educated Patient    Methods Explanation;Verbal cues;Demonstration     Comprehension Verbalized understanding;Returned demonstration;Verbal cues required              OT Short Term Goals - 01/28/21 1644       OT SHORT TERM GOAL #1   Title Pt will perform HEP for RUE strength/coordination independently.    Baseline Eval: HEP not yet established in outpatient, but pt is working with putty from [name]; 12/04/2020; [facility] HEP initiated this day with mod vc after return demo; 01/05/2021: indep with currently program, but ongoing as pt progresses; 01/28/2021: vc to revisit and focus on grip and pinch strengthening with putty    Time 6    Period Weeks    Status On-going    Target Date 03/10/21               OT Long Term Goals - 02/11/21 1635       OT LONG TERM GOAL #1   Title Pt will improve hand writing to 100% legibility with R hand to be able to independently write a check.    Baseline Eval: signature is 75% legible with R hand, requiring extra time; 12/04/2020: no change from eval; 01/05/2021: Printing is 100% legible, signature is 90% legible, but still requires extra time and effort.; 01/26/21: Signature is 90% legible and speed/fluidity have improved. 20th visit: 90% legible.    Time 12    Period Weeks    Status On-going    Target Date 04/21/21      OT LONG TERM GOAL #2   Title Pt will improve R hand coordination to enable good manipulation of iphone using R dominant hand    Baseline Eval: Pt uses L hand d/t R hand lacking sufficient FMC to press buttons on phone; 12/04/2020: no change from eval; 01/05/2021: R hand coordination is improving but pt still uses L hand to dial phone; 01/28/21: Pt using R dominant hand to press buttons and apps on phone 50% of the time.20th visit: Right hand [facility] skilols are improving, Pt. is improving with manipulation of the iphone.    Time 12    Period Weeks    Status On-going    Target Date 04/21/21      OT LONG TERM GOAL #3   Title Pt will improve GMC throughout RUE to enable pt to reach for and pick up ADL supplies with R  dominant hand without dropping or knocking over objects.    Baseline Eval: Pt reports RUE is clumsy and easily knocks over objects when trying to reach for ADL supplies.  Pt verbalizes that her "aim is off."; 12/04/2020: pt reports slight improvement since eval and has started using her R hand to eat, but still feels clumsy; 01/05/2021: Greatly improved; pt is using silverware to eat with R hand, can comb hair with RUE, still mildly  ataxic; 01/26/21: pt reports difficulty picking up objects within a small space (ie; may knock the toothpaste holder down when reaching for toothbrush), but reaching for light/larger objects is improving 20th visit: pt. continues to be mildly ataxic, however is consistently improving with motor control, and FMC while reaching targets.    Time 12    Period Weeks    Status On-going    Target Date 04/21/21      OT LONG TERM GOAL #4   Title Pt will improve FOTO score to 68 or better to indicate measurable functional improvement.    Baseline Eval: FOTO 55; 01/05/2021: FOTO 61; 01/28/21: FOTO 61, 20th visit: 61    Time 12    Period Weeks    Status On-going    Target Date 04/21/21              Plan - 02/25/21 1702     Clinical Impression Statement Pt continues to develop [facility] and GMC throughout RUE.  Pt tends to knock 1 of 5 pegs down during peg and stone activity when reaching around and between other pegs on table top.  Pt will continue to benefit from skilled OT for improving RUE ataxia and strength throughout RUE for better accuracy and efficiency when using R dominant arm for self are tasks/ADL set up/reaching for ADL supplies.    OT Occupational Profile and History Detailed Assessment- Review of Records and additional review of physical, cognitive, psychosocial history related to current functional performance    Occupational performance deficits (Please refer to evaluation for details): ADL's;Leisure;IADL's    Body Structure / Function / Physical Skills  ADL;Coordination;Endurance;GMC;UE functional use;Balance;Sensation;Body mechanics;IADL;Pain;Dexterity;FMC;Strength;Gait;Mobility    Rehab Potential Good    Clinical Decision Making Several treatment options, min-mod task modification necessary    Comorbidities Affecting Occupational Performance: May have comorbidities impacting occupational performance    Modification or Assistance to Complete Evaluation  Min-Moderate modification of tasks or assist with assess necessary to complete eval    OT Frequency 2x / week    OT Duration 12 weeks    OT Treatment/Interventions Self-care/ADL training;Therapeutic exercise;DME and/or AE instruction;Functional Mobility Training;Balance training;Neuromuscular education;Therapeutic activities;Patient/family education    Consulted and Agree with Plan of Care Patient             Patient will benefit from skilled therapeutic intervention in order to improve the following deficits and impairments:   Body Structure / Function / Physical Skills: ADL, Coordination, Endurance, GMC, UE functional use, Balance, Sensation, Body mechanics, IADL, Pain, Dexterity, FMC, Strength, Gait, Mobility       Visit Diagnosis: Muscle weakness (generalized)  Other lack of coordination    Problem List Patient Active Problem List   Diagnosis Date Noted   Carotid stenosis, symptomatic, with infarction ([facility]) 11/13/2020   Gout flare 11/10/2020   UTI (urinary tract infection) 11/10/2020   Left carotid artery stenosis 11/10/2020   Chronic kidney disease 11/10/2020   Left basal ganglia embolic stroke ([facility]) 10/24/2020   PAC (premature atrial contraction)    Pre-procedural cardiovascular examination    Ischemic stroke ([facility]) 10/20/2020   TIA (transient ischemic attack) 10/19/2020   Right hemiparesis ([facility]) 10/19/2020   Type 2 diabetes mellitus with stage 3 chronic kidney disease, without long-term current use of insulin ([facility]) 08/24/2018   Hyperlipidemia, unspecified 07/19/2017    Hypertension 07/19/2017   PUD (peptic ulcer disease) 07/19/2017   Type 2 diabetes mellitus ([facility]) 07/19/2017   Personal history of gout 08/12/2016   Anemia, unspecified 04/09/2016   Hypothyroidism,  acquired 12/10/2015   Leta Speller, MS, OTR/L  Darleene Cleaver, OT/L 02/25/2021, 5:03 PM  [place] MAIN [facility] [address] [place], [place], [address] Phone: [phone]   Fax:  [phone]  Name: Cassandra Schaefer MRN: [id number] Date of Birth: March 24, 1929

## 2021-03-02 ENCOUNTER — Ambulatory Visit: Payer: Medicare HMO | Admitting: Occupational Therapy

## 2021-03-02 ENCOUNTER — Other Ambulatory Visit: Payer: Self-pay

## 2021-03-02 ENCOUNTER — Encounter: Payer: Medicare HMO | Admitting: Speech Pathology

## 2021-03-02 ENCOUNTER — Ambulatory Visit: Payer: Medicare HMO

## 2021-03-02 ENCOUNTER — Encounter: Payer: Self-pay | Admitting: Occupational Therapy

## 2021-03-02 DIAGNOSIS — M6281 Muscle weakness (generalized): Secondary | ICD-10-CM

## 2021-03-02 DIAGNOSIS — R2689 Other abnormalities of gait and mobility: Secondary | ICD-10-CM | POA: Diagnosis not present

## 2021-03-02 DIAGNOSIS — R278 Other lack of coordination: Secondary | ICD-10-CM

## 2021-03-02 DIAGNOSIS — R2681 Unsteadiness on feet: Secondary | ICD-10-CM

## 2021-03-02 NOTE — Therapy (Signed)
[place] MAIN [facility] SERVICES [address] [place], [place], [address] Phone: [phone]   Fax:  [phone]  Physical Therapy Treatment  Patient Details  Name: Cassandra Schaefer MRN: [id number] Date of Birth: 10/27/1928 Referring Provider (PT): Dr. Dew/Dr. Ginette Pitman PCP   Encounter Date: 03/02/2021   PT End of Session - 03/02/21 1657     Visit Number 21    Number of Visits 38    Date for PT Re-Evaluation 04/27/21    Authorization Type [employer]    Authorization Time Period 02/02/21-04/27/21    Progress Note Due on Visit 20    PT Start Time 1515    PT Stop Time 1556    PT Time Calculation (min) 41 min    Equipment Utilized During Treatment Gait belt    Activity Tolerance Patient tolerated treatment well    Behavior During Therapy WFL for tasks assessed/performed             Past Medical History:  Diagnosis Date   Anemia    Cough    RESOLVING, NO FEVER   Diabetes mellitus without complication ([place])    Gout    Hypertension    Hypothyroidism    PUD (peptic ulcer disease)    Stroke [facility])    Wears dentures    full upper, partial lower    Past Surgical History:  Procedure Laterality Date   AMPUTATION TOE Left 12/14/2017   Procedure: AMPUTATION TOE-4TH MPJ;  Surgeon: Samara Deist, DPM;  Location: [place];  Service: Podiatry;  Laterality: Left;  IVA LOCAL Diabetic - oral meds   APPENDECTOMY     CATARACT EXTRACTION W/PHACO Right 06/23/2015   Procedure: CATARACT EXTRACTION PHACO AND INTRAOCULAR LENS PLACEMENT (IOC);  Surgeon: Estill Cotta, MD;  Location: [facility];  Service: Ophthalmology;  Laterality: Right;  [place] 01:28 AP% 23.4 CDE 36.14 fluid pack lot # [id number]   CATARACT EXTRACTION W/PHACO Left 07/28/2015   Procedure: CATARACT EXTRACTION PHACO AND INTRAOCULAR LENS PLACEMENT (IOC);  Surgeon: Estill Cotta, MD;  Location: [facility];  Service: Ophthalmology;  Laterality: Left;  [place]    1:29.7 AP%  24.9 CDE    41.32 fluid casette lot #[id number] H exp 09/23/2016   COONOSCOPY     AND ENDOSCOPY   DILATION AND CURETTAGE OF UTERUS     ENDARTERECTOMY Left 11/13/2020   Procedure: Evacuation of left neck hematoma;  Surgeon: Katha Cabal, MD;  Location: [facility];  Service: Vascular;  Laterality: Left;   ENDARTERECTOMY Left 11/13/2020   Procedure: ENDARTERECTOMY CAROTID;  Surgeon: Algernon Huxley, MD;  Location: [facility];  Service: Vascular;  Laterality: Left;   TONSILLECTOMY     TUBAL LIGATION      There were no vitals filed for this visit.   Subjective Assessment - 03/02/21 1511     Subjective Pt reports no major changes. Pt reports no recent falls and reports no pain.    Patient is accompained by: Family member   Daughter, [name]   Pertinent History 87 y.o. female with medical history significant for type 2 diabetes mellitus, essential hypertension, acquired hypothyroidism, hyperlipidemia, stage III chronic kidney disease reports increased right sided weakness on 10/19/20. She was diagnosed with left basal ganglia ischemic stroke. She was discharged to inpatient rehab for 10 days and then discharged home. Patient was evaluated by outpatient PT on 11/12/20. CVA was due to carotid stenosis. She underwent left carotid endartectomy on 7/21. Procedure went well however patient suffered from left cervical hematoma  and had subsequent surgical procedure to stop the bleed and remove the hematoma on 11/13/20. They were concerned with her breathing and therefore intubated her for 1 day, she was in ICU for 2 days and transferred to med/surge on 11/15/20. Patient was discharged home on 11/17/20 and is now returning to outpatient PT. She has had the stiches removed and is healing well. Denies any neck pain or stiffness. She lives with her daughter and has 24/7 caregiver support. She is still using RW for most ambulation. She is able to transfer to bedside commode well and is able to stand short time unsupported. She reports feeling  very weak and fatigued. She reports she has been dragging her right foot more. She denies any numbness/tingling; She reports feeling like her legs are wanting to do more but is hesitant because of fear of falling. She is mod I for self care morning routine. She does still receive help with showering. She has been working on increasing her activity but denies any formal exercise.    Limitations Standing;Walking    How long can you sit comfortably? NA    How long can you stand comfortably? 30-60 min with support    How long can you walk comfortably? about 100 feet with RW, still limited to short distances;    Diagnostic tests MRI shows left basal ganglia infarct 10/21/20    Patient Stated Goals "Improve balance and walking and not need RW, get back to independent."    Currently in Pain? No/denies    Pain Onset In the past 7 days            Interventions   BERG - 46/56 (improved and achieved, previously 44/56)   STS 2x5, 1x3 hands-free 2 rest intervals Pt rates intervention as hard  LAQ with 3 sec isometric at end range, wearing 1.5# weights on BLEs 2x10 BLEs  SLB progression with one LE on floor and one on 6" step 2x30-45 sec each LE. More challenging with RLE as primary stance leg.   Gait training: Emphasis on heel-toe sequencing (improving heel-strike) and step-length B, pt wearing 1.5# weights on BLEs 2x148 ft.      Pt educated throughout session about proper posture and technique with exercises. Improved exercise technique, movement at target joints, use of target muscles after min to mod verbal, visual, tactile cues.     PT Education - 03/02/21 1656     Education Details continued reassessment findings education (BERG), indications for progress, exercise technique    Person(s) Educated Patient    Methods Explanation;Demonstration;Verbal cues    Comprehension Verbalized understanding;Returned demonstration;Need further instruction;Verbal cues required              PT  Short Term Goals - 03/02/21 1659       PT SHORT TERM GOAL #1   Title Patient will be adherent to HEP at least 3x a week to improve functional strength and balance for better safety at home.    Baseline 9/21: Pt reports compliance with HEP and is confident    Time 4    Period Weeks    Status Achieved    Target Date 12/31/20      PT SHORT TERM GOAL #2   Title Patient will be independent in transferring sit<>Stand without pushing on arm rests to improve ability to get up from chair.    Baseline 9/21: pt able to complete STS without use of UEs    Time 4    Period Weeks  Status Achieved    Target Date 12/31/20               PT Long Term Goals - 03/02/21 1700       PT LONG TERM GOAL #1   Title Patient (> 1 years old) will complete five times sit to stand test in < 15 seconds indicating an increased LE strength and improved balance.    Baseline 9/21: 29 seconds hands-free 10/10: deferred 10/12: 34 sec hands free; 11/2: 23.8 sec hands-free    Time 8    Period Weeks    Status On-going    Target Date 04/27/21      PT LONG TERM GOAL #2   Title Patient will increase six minute walk test distance to >400 feet for improve gait ability    Baseline 9/21: 368 ft with RW; 10/10: deferred 10/12: 408 ft; 11/2: 476 ft with 4WW    Time 8    Period Weeks    Status Achieved    Target Date 04/27/21      PT LONG TERM GOAL #3   Title Patient will increase 10 meter walk test to >1.31ms as to improve gait speed for better community ambulation and to reduce fall risk.    Baseline 9/21: 0.5 m/s with RW; 10/10 deferred 10/12: 0.93 m/s with 4WW; 11/2: 0.83 m/s with 4WW    Time 8    Period Weeks    Status Partially Met    Target Date 04/27/21      PT LONG TERM GOAL #4   Title Patient will be independent with ascend/descend 6 steps using single UE in step over step pattern without LOB.    Baseline 9/21:  Patient uses PT stairs (4 steps) with bilateral upper extremity support on handrails for  both ascending/descending.  Patient exhibits reciprocal pattern ascending and step to pattern descending. 10/10: deferred; 10/12: ascending/descending recip. steps with BUE support; 11/2: Ascended/descended 8 steps total, reciprocal pattern ascending and step-to descending. Pt uses UUE support throughout with CGA assist.    Time 8    Period Weeks    Status Partially Met    Target Date 04/27/21      PT LONG TERM GOAL #5   Title Patient will demonstrate an improved Berg Balance Score of >45/56 as to demonstrate improved balance with ADLs such as sitting/standing and transfer balance and reduced fall risk.    Baseline 9/21: deferred d/t time; 9/29: 42/56; 10/10: deferred; 10/12: 44/56; 11/2: deferred; 11/7: 46/56    Time 8    Period Weeks    Status Achieved    Target Date 04/27/21      PT LONG TERM GOAL #6   Title Patient will improve FOTO score to >60% to indicate improved functional mobility with ADLs.    Baseline 9/21: 59; 10/10: 57; 11/2: 60    Time 8    Period Weeks    Status Partially Met    Target Date 04/27/21                   Plan - 03/02/21 1657     Clinical Impression Statement BMerrilee Janskyreassessment completed on this date. Pt has improved score from 44/56 to 46/56, indicating improved balance and achieving goal. While pt shows progress, she is still very challenged with SLB. Pt was also able to progress to gait training with BLE ankle weights (1.5#) but does fatigue quickly. The pt will benefit from further skilled PT to improve strength, gait, endurance and  balance to increase ease and safety with ADLs.    Personal Factors and Comorbidities Age    Comorbidities HTN, CKD, fall risk, type 2 diabetes,    Examination-Activity Limitations Lift;Locomotion Level;Squat;Stairs;Stand;Toileting;Transfers;Bathing    Examination-Participation Restrictions Cleaning;Community Activity;Laundry;Meal Prep;Shop;Volunteer;Driving    Stability/Clinical Decision Making Stable/Uncomplicated     Rehab Potential Poor    PT Frequency 2x / week    PT Duration 12 weeks    PT Treatment/Interventions Cryotherapy;Electrical Stimulation;Moist Heat;Gait training;Stair training;Functional mobility training;Therapeutic activities;Therapeutic exercise;Neuromuscular re-education;Balance training;Patient/family education;Orthotic Fit/Training;Energy conservation    PT Next Visit Plan work on LE strengthening and balance; gait training, endurance    PT Home Exercise Plan Access Code: [email or web address]; no updates    Consulted and Agree with Plan of Care Patient             Patient will benefit from skilled therapeutic intervention in order to improve the following deficits and impairments:  Abnormal gait, Decreased balance, Decreased endurance, Decreased mobility, Difficulty walking, Cardiopulmonary status limiting activity, Decreased activity tolerance, Decreased coordination, Decreased strength  Visit Diagnosis: Muscle weakness (generalized)  Other abnormalities of gait and mobility  Unsteadiness on feet  Other lack of coordination     Problem List Patient Active Problem List   Diagnosis Date Noted   Carotid stenosis, symptomatic, with infarction ([place]) 11/13/2020   Gout flare 11/10/2020   UTI (urinary tract infection) 11/10/2020   Left carotid artery stenosis 11/10/2020   Chronic kidney disease 11/10/2020   Left basal ganglia embolic stroke ([place]) 70/26/3785   PAC (premature atrial contraction)    Pre-procedural cardiovascular examination    Ischemic stroke ([place]) 10/20/2020   TIA (transient ischemic attack) 10/19/2020   Right hemiparesis ([place]) 10/19/2020   Type 2 diabetes mellitus with stage 3 chronic kidney disease, without long-term current use of insulin ([place]) 08/24/2018   Hyperlipidemia, unspecified 07/19/2017   Hypertension 07/19/2017   PUD (peptic ulcer disease) 07/19/2017   Type 2 diabetes mellitus ([place]) 07/19/2017   Personal history of gout 08/12/2016   Anemia,  unspecified 04/09/2016   Hypothyroidism, acquired 12/10/2015    Zollie Pee, PT 03/02/2021, 5:04 PM  [place] MAIN [facility] SERVICES [address] [place], [place], [address] Phone: [phone]   Fax:  [phone]  Name: Cassandra Schaefer MRN: [id number] Date of Birth: June 14, 1928

## 2021-03-02 NOTE — Therapy (Signed)
[place] [facility] MAIN [facility] SERVICES [address] [place], [place], [address] Phone: [phone]   Fax:  [phone]  Occupational Therapy Treatment  Patient Details  Name: Cassandra Schaefer MRN: [id number] Date of Birth: Oct 18, 1928 Referring Provider (OT): Dr. Barbette Reichmann   Encounter Date: 03/02/2021   OT End of Session - 03/02/21 1609     Visit Number 24    Number of Visits 40    Authorization Time Period Reporting period starting 01/07/2021    OT Start Time 1603    OT Stop Time 1645    OT Time Calculation (min) 42 min    Activity Tolerance Patient tolerated treatment well    Behavior During Therapy WFL for tasks assessed/performed             Past Medical History:  Diagnosis Date   Anemia    Cough    RESOLVING, NO FEVER   Diabetes mellitus without complication ([facility])    Gout    Hypertension    Hypothyroidism    PUD (peptic ulcer disease)    Stroke [facility])    Wears dentures    full upper, partial lower    Past Surgical History:  Procedure Laterality Date   AMPUTATION TOE Left 12/14/2017   Procedure: AMPUTATION TOE-4TH MPJ;  Surgeon: Gwyneth Revels, DPM;  Location: [facility];  Service: Podiatry;  Laterality: Left;  IVA LOCAL Diabetic - oral meds   APPENDECTOMY     CATARACT EXTRACTION W/PHACO Right 06/23/2015   Procedure: CATARACT EXTRACTION PHACO AND INTRAOCULAR LENS PLACEMENT (IOC);  Surgeon: Sallee Lange, MD;  Location: [facility];  Service: Ophthalmology;  Laterality: Right;  [place] 01:28 AP% 23.4 CDE 36.14 fluid pack lot # [id number]   CATARACT EXTRACTION W/PHACO Left 07/28/2015   Procedure: CATARACT EXTRACTION PHACO AND INTRAOCULAR LENS PLACEMENT (IOC);  Surgeon: Sallee Lange, MD;  Location: [facility];  Service: Ophthalmology;  Laterality: Left;  [place]    1:29.7 AP%  24.9 CDE   41.32 fluid casette lot #[id number] H exp 09/23/2016   COONOSCOPY     AND ENDOSCOPY   DILATION AND CURETTAGE OF UTERUS     ENDARTERECTOMY  Left 11/13/2020   Procedure: Evacuation of left neck hematoma;  Surgeon: Renford Dills, MD;  Location: [facility];  Service: Vascular;  Laterality: Left;   ENDARTERECTOMY Left 11/13/2020   Procedure: ENDARTERECTOMY CAROTID;  Surgeon: Annice Needy, MD;  Location: [facility];  Service: Vascular;  Laterality: Left;   TONSILLECTOMY     TUBAL LIGATION      There were no vitals filed for this visit.   Subjective Assessment - 03/02/21 1607     Subjective  Pt. reports that [facility] skills for small items continue to be difficult for her.    Patient is accompanied by: Family member    Pertinent History R ankle pain, gout, T2DM, HTN, CKDIII; R/L carotid endarectomy    Patient Stated Goals "I want to improve my aim when I'm using my R arm."    Currently in Pain? No/denies           OT TREATMENT    Neuro muscular re-education:  Pt. worked on [facility] skills grasping 1" sticks, 1/4" collars, and 1/4" washers. Pt. worked on storing the objects in the palm, and translatory skills moving the items from the palm of the hand to the tip of the 2nd digit, and thumb. Pt. worked on removing the pegs using bilateral alternating hand patterns.   Pt. is improving with right  hand function, and is now using her right hand more during ADL, and IADL tasks at home. As pt. was working on translatory movements the task was modified from storing multiple items, to one item. Pt. continues to present with difficulty with [facility] skills manipulating small objects. Pt. presented with slower bilateral alternating hand movements when removing the 1" sticks. Pt. continues to work on improving [facility] skills in order to work towards improving right hand function, and improving/maximizing independence with ADLs, and IADLs.                          OT Education - 03/02/21 1609     Education Details coordination exercises    Person(s) Educated Patient    Methods Explanation;Verbal cues;Demonstration    Comprehension  Verbalized understanding;Returned demonstration;Verbal cues required              OT Short Term Goals - 01/28/21 1644       OT SHORT TERM GOAL #1   Title Pt will perform HEP for RUE strength/coordination independently.    Baseline Eval: HEP not yet established in outpatient, but pt is working with putty from [name]; 12/04/2020; [facility] HEP initiated this day with mod vc after return demo; 01/05/2021: indep with currently program, but ongoing as pt progresses; 01/28/2021: vc to revisit and focus on grip and pinch strengthening with putty    Time 6    Period Weeks    Status On-going    Target Date 03/10/21               OT Long Term Goals - 02/11/21 1635       OT LONG TERM GOAL #1   Title Pt will improve hand writing to 100% legibility with R hand to be able to independently write a check.    Baseline Eval: signature is 75% legible with R hand, requiring extra time; 12/04/2020: no change from eval; 01/05/2021: Printing is 100% legible, signature is 90% legible, but still requires extra time and effort.; 01/26/21: Signature is 90% legible and speed/fluidity have improved. 20th visit: 90% legible.    Time 12    Period Weeks    Status On-going    Target Date 04/21/21      OT LONG TERM GOAL #2   Title Pt will improve R hand coordination to enable good manipulation of iphone using R dominant hand    Baseline Eval: Pt uses L hand d/t R hand lacking sufficient [place] to press buttons on phone; 12/04/2020: no change from eval; 01/05/2021: R hand coordination is improving but pt still uses L hand to dial phone; 01/28/21: Pt using R dominant hand to press buttons and apps on phone 50% of the time.20th visit: Right hand [facility] skilols are improving, Pt. is improving with manipulation of the iphone.    Time 12    Period Weeks    Status On-going    Target Date 04/21/21      OT LONG TERM GOAL #3   Title Pt will improve GMC throughout RUE to enable pt to reach for and pick up ADL supplies with R dominant hand  without dropping or knocking over objects.    Baseline Eval: Pt reports RUE is clumsy and easily knocks over objects when trying to reach for ADL supplies.  Pt verbalizes that her "aim is off."; 12/04/2020: pt reports slight improvement since eval and has started using her R hand to eat, but still feels clumsy; 01/05/2021: Greatly  improved; pt is using silverware to eat with R hand, can comb hair with RUE, still mildly ataxic; 01/26/21: pt reports difficulty picking up objects within a small space (ie; may knock the toothpaste holder down when reaching for toothbrush), but reaching for light/larger objects is improving 20th visit: pt. continues to be mildly ataxic, however is consistently improving with motor control, and [place] while reaching targets.    Time 12    Period Weeks    Status On-going    Target Date 04/21/21      OT LONG TERM GOAL #4   Title Pt will improve FOTO score to 68 or better to indicate measurable functional improvement.    Baseline Eval: FOTO 55; 01/05/2021: FOTO 61; 01/28/21: FOTO 61, 20th visit: 61    Time 12    Period Weeks    Status On-going    Target Date 04/21/21                   Plan - 03/02/21 1610     Clinical Impression Statement Pt. is improving with right hand function, and is now using her right hand more during ADL, and IADL tasks at home. As pt. was working on translatory movements the task was modified from storing multiple items, to one item. Pt. continues to present with difficulty with [facility] skills manipulating small objects. Pt. presented with slower bilateral alternating hand movements when removing the 1" sticks. Pt. continues to work on improving [facility] skills in order to work towards improving right hand function, and improving/maximizing independence with ADLs, and IADLs.   OT Occupational Profile and History Detailed Assessment- Review of Records and additional review of physical, cognitive, psychosocial history related to current functional  performance    Occupational performance deficits (Please refer to evaluation for details): ADL's;Leisure;IADL's    Body Structure / Function / Physical Skills ADL;Coordination;Endurance;GMC;UE functional use;Balance;Sensation;Body mechanics;IADL;Pain;Dexterity;FMC;Strength;Gait;Mobility    Rehab Potential Good    Clinical Decision Making Several treatment options, min-mod task modification necessary    Comorbidities Affecting Occupational Performance: May have comorbidities impacting occupational performance    Modification or Assistance to Complete Evaluation  Min-Moderate modification of tasks or assist with assess necessary to complete eval    OT Frequency 2x / week    OT Duration 12 weeks    OT Treatment/Interventions Self-care/ADL training;Therapeutic exercise;DME and/or AE instruction;Functional Mobility Training;Balance training;Neuromuscular education;Therapeutic activities;Patient/family education    Consulted and Agree with Plan of Care Patient             Patient will benefit from skilled therapeutic intervention in order to improve the following deficits and impairments:   Body Structure / Function / Physical Skills: ADL, Coordination, Endurance, GMC, UE functional use, Balance, Sensation, Body mechanics, IADL, Pain, Dexterity, FMC, Strength, Gait, Mobility       Visit Diagnosis: Muscle weakness (generalized)  Other lack of coordination    Problem List Patient Active Problem List   Diagnosis Date Noted   Carotid stenosis, symptomatic, with infarction ([place]) 11/13/2020   Gout flare 11/10/2020   UTI (urinary tract infection) 11/10/2020   Left carotid artery stenosis 11/10/2020   Chronic kidney disease 11/10/2020   Left basal ganglia embolic stroke ([place]) [id number]   PAC (premature atrial contraction)    Pre-procedural cardiovascular examination    Ischemic stroke ([place]) 10/20/2020   TIA (transient ischemic attack) 10/19/2020   Right hemiparesis ([place]) 10/19/2020    Type 2 diabetes mellitus with stage 3 chronic kidney disease, without long-term current use of insulin ([place])  08/24/2018   Hyperlipidemia, unspecified 07/19/2017   Hypertension 07/19/2017   PUD (peptic ulcer disease) 07/19/2017   Type 2 diabetes mellitus ([place]) 07/19/2017   Personal history of gout 08/12/2016   Anemia, unspecified 04/09/2016   Hypothyroidism, acquired 12/10/2015    Harrel Carina, MS, OTR/L 03/02/2021, 4:12 PM  [place], [place], [address] Phone: [phone]   Fax:  [phone]  Name: Cassandra Schaefer MRN: [id number] Date of Birth: July 15, 1928

## 2021-03-04 ENCOUNTER — Encounter: Payer: Medicare HMO | Admitting: Speech Pathology

## 2021-03-04 ENCOUNTER — Other Ambulatory Visit: Payer: Self-pay

## 2021-03-04 ENCOUNTER — Ambulatory Visit: Payer: Medicare HMO | Admitting: Occupational Therapy

## 2021-03-04 ENCOUNTER — Ambulatory Visit: Payer: Medicare HMO

## 2021-03-04 DIAGNOSIS — R2681 Unsteadiness on feet: Secondary | ICD-10-CM

## 2021-03-04 DIAGNOSIS — R278 Other lack of coordination: Secondary | ICD-10-CM

## 2021-03-04 DIAGNOSIS — M6281 Muscle weakness (generalized): Secondary | ICD-10-CM

## 2021-03-04 DIAGNOSIS — R2689 Other abnormalities of gait and mobility: Secondary | ICD-10-CM

## 2021-03-04 NOTE — Therapy (Cosign Needed Addendum)
[place] MAIN [facility] SERVICES [address] [place], [place], [address] Phone: [phone]   Fax:  [phone]  Physical Therapy Treatment  Patient Details  Name: Cassandra Schaefer MRN: [id number] Date of Birth: [birth year] Referring Provider (PT): Dr. Dew/Dr. Ginette Pitman PCP   Encounter Date: 03/04/2021   PT End of Session - 03/04/21 1629     Visit Number 22    Number of Visits 38    Date for PT Re-Evaluation 04/27/21    Authorization Type [employer]    Authorization Time Period 02/02/21-04/27/21    Progress Note Due on Visit 20    PT Start Time 1517    PT Stop Time 1559    PT Time Calculation (min) 42 min    Equipment Utilized During Treatment Gait belt    Activity Tolerance Patient tolerated treatment well    Behavior During Therapy WFL for tasks assessed/performed             Past Medical History:  Diagnosis Date   Anemia    Cough    RESOLVING, NO FEVER   Diabetes mellitus without complication ([place])    Gout    Hypertension    Hypothyroidism    PUD (peptic ulcer disease)    Stroke [facility])    Wears dentures    full upper, partial lower    Past Surgical History:  Procedure Laterality Date   AMPUTATION TOE Left 12/14/2017   Procedure: AMPUTATION TOE-4TH MPJ;  Surgeon: Samara Deist, DPM;  Location: [place];  Service: Podiatry;  Laterality: Left;  IVA LOCAL Diabetic - oral meds   APPENDECTOMY     CATARACT EXTRACTION W/PHACO Right 06/23/2015   Procedure: CATARACT EXTRACTION PHACO AND INTRAOCULAR LENS PLACEMENT (IOC);  Surgeon: Estill Cotta, MD;  Location: [facility];  Service: Ophthalmology;  Laterality: Right;  [place] 01:28 AP% 23.4 CDE 36.14 fluid pack lot # [id number]   CATARACT EXTRACTION W/PHACO Left 07/28/2015   Procedure: CATARACT EXTRACTION PHACO AND INTRAOCULAR LENS PLACEMENT (IOC);  Surgeon: Estill Cotta, MD;  Location: [facility];  Service: Ophthalmology;  Laterality: Left;  [place]    1:29.7 AP%  24.9 CDE    41.32 fluid casette lot #[id number] H exp 09/23/2016   COONOSCOPY     AND ENDOSCOPY   DILATION AND CURETTAGE OF UTERUS     ENDARTERECTOMY Left 11/13/2020   Procedure: Evacuation of left neck hematoma;  Surgeon: Katha Cabal, MD;  Location: [facility];  Service: Vascular;  Laterality: Left;   ENDARTERECTOMY Left 11/13/2020   Procedure: ENDARTERECTOMY CAROTID;  Surgeon: Algernon Huxley, MD;  Location: [facility];  Service: Vascular;  Laterality: Left;   TONSILLECTOMY     TUBAL LIGATION      There were no vitals filed for this visit.   Subjective Assessment - 03/04/21 1522     Subjective Pt reports no changes. She denies pain and has not had any recent falls.    Patient is accompained by: Family member   Daughter, [name]   Pertinent History 87 y.o. female with medical history significant for type 2 diabetes mellitus, essential hypertension, acquired hypothyroidism, hyperlipidemia, stage III chronic kidney disease reports increased right sided weakness on 10/19/20. She was diagnosed with left basal ganglia ischemic stroke. She was discharged to inpatient rehab for 10 days and then discharged home. Patient was evaluated by outpatient PT on 11/12/20. CVA was due to carotid stenosis. She underwent left carotid endartectomy on 7/21. Procedure went well however patient suffered from left cervical hematoma  and had subsequent surgical procedure to stop the bleed and remove the hematoma on 11/13/20. They were concerned with her breathing and therefore intubated her for 1 day, she was in ICU for 2 days and transferred to med/surge on 11/15/20. Patient was discharged home on 11/17/20 and is now returning to outpatient PT. She has had the stiches removed and is healing well. Denies any neck pain or stiffness. She lives with her daughter and has 24/7 caregiver support. She is still using RW for most ambulation. She is able to transfer to bedside commode well and is able to stand short time unsupported. She reports feeling very  weak and fatigued. She reports she has been dragging her right foot more. She denies any numbness/tingling; She reports feeling like her legs are wanting to do more but is hesitant because of fear of falling. She is mod I for self care morning routine. She does still receive help with showering. She has been working on increasing her activity but denies any formal exercise.    Limitations Standing;Walking    How long can you sit comfortably? NA    How long can you stand comfortably? 30-60 min with support    How long can you walk comfortably? about 100 feet with RW, still limited to short distances;    Diagnostic tests MRI shows left basal ganglia infarct 10/21/20    Patient Stated Goals "Improve balance and walking and not need RW, get back to independent."    Pain Onset In the past 7 days               INTERVENTION  Therex:  STS x 5 BUE support   - Progressed to 2 x 5 with no UE support. Pt perceived exercise as hard but improved with repetitions.  LAQ with 3 sec isometric at end range, wearing 2# weights on BLEs x10 reps, progressed to  x 12 reps  Standing marches 2 x 12 each side with 2# weights on BLEs  Standing hip extension 2 x 12 each side with 2# weights on BLEs. Cuing for standing straight and reducing sway compensation  Ambulation x 444 ft with 2# ankle weights on BLEs to improve endurance and LE strength. VC on heel-toe sequencing and increased dorsiflexion for foot improved foot clearance, heel strike, and longer step length    Neuromuscular Re-ed:  SLB progression with right LE on airex and left on 6" step 2x30-45 sec. Pt  utilizes ankle strategy on the right primary stance leg.  - progressed to eyes closed 2 x 45 seconds. Pt shows increased              posterior sway with Min assist provided from SPT.  Toe taps on 6" step with SUE support 2 x 10 each side. Cuing provided for increased hip flexion and dorsiflexion for improved foot clearance on the right.       Pt educated throughout session about proper posture and technique with exercises. Improved exercise technique, movement at target joints, use of target muscles after min to mod verbal, visual, tactile cues.            PT Education - 03/04/21 1628     Education Details exercise technique, body mechanics    Person(s) Educated Patient    Methods Explanation;Demonstration;Verbal cues    Comprehension Verbalized understanding;Returned demonstration;Verbal cues required;Need further instruction              PT Short Term Goals - 03/02/21 1659  PT SHORT TERM GOAL #1   Title Patient will be adherent to HEP at least 3x a week to improve functional strength and balance for better safety at home.    Baseline 9/21: Pt reports compliance with HEP and is confident    Time 4    Period Weeks    Status Achieved    Target Date 12/31/20      PT SHORT TERM GOAL #2   Title Patient will be independent in transferring sit<>Stand without pushing on arm rests to improve ability to get up from chair.    Baseline 9/21: pt able to complete STS without use of UEs    Time 4    Period Weeks    Status Achieved    Target Date 12/31/20               PT Long Term Goals - 03/02/21 1700       PT LONG TERM GOAL #1   Title Patient (> 33 years old) will complete five times sit to stand test in < 15 seconds indicating an increased LE strength and improved balance.    Baseline 9/21: 29 seconds hands-free 10/10: deferred 10/12: 34 sec hands free; 11/2: 23.8 sec hands-free    Time 8    Period Weeks    Status On-going    Target Date 04/27/21      PT LONG TERM GOAL #2   Title Patient will increase six minute walk test distance to >400 feet for improve gait ability    Baseline 9/21: 368 ft with RW; 10/10: deferred 10/12: 408 ft; 11/2: 476 ft with 4WW    Time 8    Period Weeks    Status Achieved    Target Date 04/27/21      PT LONG TERM GOAL #3   Title Patient will increase 10 meter walk  test to >1.64ms as to improve gait speed for better community ambulation and to reduce fall risk.    Baseline 9/21: 0.5 m/s with RW; 10/10 deferred 10/12: 0.93 m/s with 4WW; 11/2: 0.83 m/s with 4WW    Time 8    Period Weeks    Status Partially Met    Target Date 04/27/21      PT LONG TERM GOAL #4   Title Patient will be independent with ascend/descend 6 steps using single UE in step over step pattern without LOB.    Baseline 9/21:  Patient uses PT stairs (4 steps) with bilateral upper extremity support on handrails for both ascending/descending.  Patient exhibits reciprocal pattern ascending and step to pattern descending. 10/10: deferred; 10/12: ascending/descending recip. steps with BUE support; 11/2: Ascended/descended 8 steps total, reciprocal pattern ascending and step-to descending. Pt uses UUE support throughout with CGA assist.    Time 8    Period Weeks    Status Partially Met    Target Date 04/27/21      PT LONG TERM GOAL #5   Title Patient will demonstrate an improved Berg Balance Score of >45/56 as to demonstrate improved balance with ADLs such as sitting/standing and transfer balance and reduced fall risk.    Baseline 9/21: deferred d/t time; 9/29: 42/56; 10/10: deferred; 10/12: 44/56; 11/2: deferred; 11/7: 46/56    Time 8    Period Weeks    Status Achieved    Target Date 04/27/21      PT LONG TERM GOAL #6   Title Patient will improve FOTO score to >60% to indicate improved functional mobility with ADLs.  Baseline 9/21: 59; 10/10: 57; 11/2: 60    Time 8    Period Weeks    Status Partially Met    Target Date 04/27/21                   Plan - 03/04/21 1630     Clinical Impression Statement Pt had great motivation for all interventions today. She was able to ambulate 444 ft showing improved walking distance and endurance. Pt tolerated progression to 2# ankle weights for strengthening well. She continues to be challenged by SLB progressions, especially when the  RLE is the primary stance leg. The pt will benefit from further skilled PT to improve strength, gait, endurance, and balance to increase ease and safety with ADLs.    Personal Factors and Comorbidities Age    Comorbidities HTN, CKD, fall risk, type 2 diabetes,    Examination-Activity Limitations Lift;Locomotion Level;Squat;Stairs;Stand;Toileting;Transfers;Bathing    Examination-Participation Restrictions Cleaning;Community Activity;Laundry;Meal Prep;Shop;Volunteer;Driving    Stability/Clinical Decision Making Stable/Uncomplicated    Rehab Potential Poor    PT Frequency 2x / week    PT Duration 12 weeks    PT Treatment/Interventions Cryotherapy;Electrical Stimulation;Moist Heat;Gait training;Stair training;Functional mobility training;Therapeutic activities;Therapeutic exercise;Neuromuscular re-education;Balance training;Patient/family education;Orthotic Fit/Training;Energy conservation    PT Next Visit Plan work on LE strengthening and balance; gait training, endurance    PT Home Exercise Plan Access Code: [email or web address]; no updates    Consulted and Agree with Plan of Care Patient             Patient will benefit from skilled therapeutic intervention in order to improve the following deficits and impairments:  Abnormal gait, Decreased balance, Decreased endurance, Decreased mobility, Difficulty walking, Cardiopulmonary status limiting activity, Decreased activity tolerance, Decreased coordination, Decreased strength  Visit Diagnosis: Muscle weakness (generalized)  Unsteadiness on feet  Other lack of coordination  Other abnormalities of gait and mobility     Problem List Patient Active Problem List   Diagnosis Date Noted   Carotid stenosis, symptomatic, with infarction ([place]) 11/13/2020   Gout flare 11/10/2020   UTI (urinary tract infection) 11/10/2020   Left carotid artery stenosis 11/10/2020   Chronic kidney disease 11/10/2020   Left basal ganglia embolic stroke ([place]) 15/72/6203    PAC (premature atrial contraction)    Pre-procedural cardiovascular examination    Ischemic stroke ([place]) 10/20/2020   TIA (transient ischemic attack) 10/19/2020   Right hemiparesis ([place]) 10/19/2020   Type 2 diabetes mellitus with stage 3 chronic kidney disease, without long-term current use of insulin ([place]) 08/24/2018   Hyperlipidemia, unspecified 07/19/2017   Hypertension 07/19/2017   PUD (peptic ulcer disease) 07/19/2017   Type 2 diabetes mellitus ([place]) 07/19/2017   Personal history of gout 08/12/2016   Anemia, unspecified 04/09/2016   Hypothyroidism, acquired 12/10/2015   Ricard Dillon PT, DPT This entire session was performed under direct supervision and direction of a licensed therapist/therapist assistant . I have personally read, edited and approve of the note as written.   Karn Cassis, SPT 03/04/2021, 4:40 PM  [place] MAIN [facility] SERVICES [address] [place], [place], [address] Phone: [phone]   Fax:  [phone]  Name: Jahaira Earnhart MRN: [id number] Date of Birth: 1928/07/07

## 2021-03-07 NOTE — Therapy (Signed)
[place] MAIN [facility] SERVICES [address] [place], [place], [address] Phone: [phone]   Fax:  [phone]  Occupational Therapy Treatment  Patient Details  Name: Cassandra Schaefer MRN: [id number] Date of Birth: 10-Apr-1929 Referring Provider (OT): Dr. Tracie Harrier   Encounter Date: 03/04/2021   OT End of Session - 03/07/21 1837     Visit Number 25    Number of Visits 40    Authorization Time Period Reporting period starting 01/07/2021    OT Start Time 1600    OT Stop Time 1645    OT Time Calculation (min) 45 min    Activity Tolerance Patient tolerated treatment well    Behavior During Therapy WFL for tasks assessed/performed             Past Medical History:  Diagnosis Date   Anemia    Cough    RESOLVING, NO FEVER   Diabetes mellitus without complication ([place])    Gout    Hypertension    Hypothyroidism    PUD (peptic ulcer disease)    Stroke [facility])    Wears dentures    full upper, partial lower    Past Surgical History:  Procedure Laterality Date   AMPUTATION TOE Left 12/14/2017   Procedure: AMPUTATION TOE-4TH MPJ;  Surgeon: Samara Deist, DPM;  Location: [place];  Service: Podiatry;  Laterality: Left;  IVA LOCAL Diabetic - oral meds   APPENDECTOMY     CATARACT EXTRACTION W/PHACO Right 06/23/2015   Procedure: CATARACT EXTRACTION PHACO AND INTRAOCULAR LENS PLACEMENT (IOC);  Surgeon: Estill Cotta, MD;  Location: [facility];  Service: Ophthalmology;  Laterality: Right;  [place] 01:28 AP% 23.4 CDE 36.14 fluid pack lot # [id number]   CATARACT EXTRACTION W/PHACO Left 07/28/2015   Procedure: CATARACT EXTRACTION PHACO AND INTRAOCULAR LENS PLACEMENT (IOC);  Surgeon: Estill Cotta, MD;  Location: [facility];  Service: Ophthalmology;  Laterality: Left;  [place]    1:29.7 AP%  24.9 CDE   41.32 fluid casette lot [id number] H exp 09/23/2016   COONOSCOPY     AND ENDOSCOPY   DILATION AND CURETTAGE OF UTERUS     ENDARTERECTOMY  Left 11/13/2020   Procedure: Evacuation of left neck hematoma;  Surgeon: Katha Cabal, MD;  Location: [facility];  Service: Vascular;  Laterality: Left;   ENDARTERECTOMY Left 11/13/2020   Procedure: ENDARTERECTOMY CAROTID;  Surgeon: Algernon Huxley, MD;  Location: [facility];  Service: Vascular;  Laterality: Left;   TONSILLECTOMY     TUBAL LIGATION      There were no vitals filed for this visit.   Subjective Assessment - 03/07/21 1836     Subjective  Pt reports she is doing well, still struggles with fine motor coordination    Patient is accompanied by: Family member    Pertinent History R ankle pain, gout, T2DM, HTN, CKDIII; R/L carotid endarectomy    Patient Stated Goals "I want to improve my aim when I'm using my R arm."    Currently in Pain? No/denies    Pain Score 0-No pain            Pt denies any pain this date.   Pt state she was able to put toothbrush into the holder without bumping into other things.  She is not knocking things over as much as she did before.   Neuromuscular Reeducation:  Pt seen for use of Judy board for grasping/release and manipulation of items, picking up with right hand and placing into board,  difficulty noted with flipping item end to end.   Pt seen for manipulation of Cards with right hand, flipping with thumb and finger combinations, flipping towards her and away with cues for proper technique.  Continued to work on thumb/finger in isolation, then in a combination and added speed to task.  Instructed on adding complexity with sort by color, black/red and then by suit.   Performed Scatterpile with complete deck and working to pick up cards from flat surface table and placing them in ascending order by suit.  Unknotting exercise with medium sized nylon rope with use of bilateral UEs.  Manipulation of Glass beads to pick up and use translatory skills of the hand to move to palm, using hand for storage and then moving items back to fingertips to place into  container.  Cues for prehension patterns, form and speed of task.    Response to tx: Pt continues to progress with functional hand skills, she continues to demonstrate difficulty with manipulation skills so managing cards was difficult today.  She reports she has cards at home and will work on some of the tasks we performed in the clinic over the next few days.  She demonstrated difficulty with turning and flipping pegs from end to end.  She performed well with picking up objects and with unknotting exercise.  Continue to work towards goals in plan of care to maximize safety and independence in necessary daily tasks.                      OT Education - 03/07/21 1837     Education Details fine motor coordination exercises, functional hand skills    Person(s) Educated Patient    Methods Explanation;Verbal cues;Demonstration    Comprehension Verbalized understanding;Returned demonstration;Verbal cues required              OT Short Term Goals - 01/28/21 1644       OT SHORT TERM GOAL #1   Title Pt will perform HEP for RUE strength/coordination independently.    Baseline Eval: HEP not yet established in outpatient, but pt is working with putty from [name]; 12/04/2020; [facility] HEP initiated this day with mod vc after return demo; 01/05/2021: indep with currently program, but ongoing as pt progresses; 01/28/2021: vc to revisit and focus on grip and pinch strengthening with putty    Time 6    Period Weeks    Status On-going    Target Date 03/10/21               OT Long Term Goals - 02/11/21 1635       OT LONG TERM GOAL #1   Title Pt will improve hand writing to 100% legibility with R hand to be able to independently write a check.    Baseline Eval: signature is 75% legible with R hand, requiring extra time; 12/04/2020: no change from eval; 01/05/2021: Printing is 100% legible, signature is 90% legible, but still requires extra time and effort.; 01/26/21: Signature is 90% legible  and speed/fluidity have improved. 20th visit: 90% legible.    Time 12    Period Weeks    Status On-going    Target Date 04/21/21      OT LONG TERM GOAL #2   Title Pt will improve R hand coordination to enable good manipulation of iphone using R dominant hand    Baseline Eval: Pt uses L hand d/t R hand lacking sufficient FMC to press buttons on phone; 12/04/2020: no  change from eval; 01/05/2021: R hand coordination is improving but pt still uses L hand to dial phone; 01/28/21: Pt using R dominant hand to press buttons and apps on phone 50% of the time.20th visit: Right hand [facility] skilols are improving, Pt. is improving with manipulation of the iphone.    Time 12    Period Weeks    Status On-going    Target Date 04/21/21      OT LONG TERM GOAL #3   Title Pt will improve GMC throughout RUE to enable pt to reach for and pick up ADL supplies with R dominant hand without dropping or knocking over objects.    Baseline Eval: Pt reports RUE is clumsy and easily knocks over objects when trying to reach for ADL supplies.  Pt verbalizes that her "aim is off."; 12/04/2020: pt reports slight improvement since eval and has started using her R hand to eat, but still feels clumsy; 01/05/2021: Greatly improved; pt is using silverware to eat with R hand, can comb hair with RUE, still mildly ataxic; 01/26/21: pt reports difficulty picking up objects within a small space (ie; may knock the toothpaste holder down when reaching for toothbrush), but reaching for light/larger objects is improving 20th visit: pt. continues to be mildly ataxic, however is consistently improving with motor control, and [place] while reaching targets.    Time 12    Period Weeks    Status On-going    Target Date 04/21/21      OT LONG TERM GOAL #4   Title Pt will improve FOTO score to 68 or better to indicate measurable functional improvement.    Baseline Eval: FOTO 55; 01/05/2021: FOTO 61; 01/28/21: FOTO 61, 20th visit: 61    Time 12    Period Weeks     Status On-going    Target Date 04/21/21                   Plan - 03/07/21 1838     Clinical Impression Statement Pt continues to progress with functional hand skills, she continues to demonstrate difficulty with manipulation skills so managing cards was difficult today.  She reports she has cards at home and will work on some of the tasks we performed in the clinic over the next few days.  She demonstrated difficulty with turning and flipping pegs from end to end.  She performed well with picking up objects and with unknotting exercise.  Continue to work towards goals in plan of care to maximize safety and independence in necessary daily tasks.    OT Occupational Profile and History Detailed Assessment- Review of Records and additional review of physical, cognitive, psychosocial history related to current functional performance    Occupational performance deficits (Please refer to evaluation for details): ADL's;Leisure;IADL's    Body Structure / Function / Physical Skills ADL;Coordination;Endurance;GMC;UE functional use;Balance;Sensation;Body mechanics;IADL;Pain;Dexterity;FMC;Strength;Gait;Mobility    Rehab Potential Good    Clinical Decision Making Several treatment options, min-mod task modification necessary    Comorbidities Affecting Occupational Performance: May have comorbidities impacting occupational performance    Modification or Assistance to Complete Evaluation  Min-Moderate modification of tasks or assist with assess necessary to complete eval    OT Frequency 2x / week    OT Duration 12 weeks    OT Treatment/Interventions Self-care/ADL training;Therapeutic exercise;DME and/or AE instruction;Functional Mobility Training;Balance training;Neuromuscular education;Therapeutic activities;Patient/family education    Consulted and Agree with Plan of Care Patient             Patient  will benefit from skilled therapeutic intervention in order to improve the following deficits  and impairments:   Body Structure / Function / Physical Skills: ADL, Coordination, Endurance, GMC, UE functional use, Balance, Sensation, Body mechanics, IADL, Pain, Dexterity, FMC, Strength, Gait, Mobility       Visit Diagnosis: Muscle weakness (generalized)  Other lack of coordination  Unsteadiness on feet    Problem List Patient Active Problem List   Diagnosis Date Noted   Carotid stenosis, symptomatic, with infarction ([place]) 11/13/2020   Gout flare 11/10/2020   UTI (urinary tract infection) 11/10/2020   Left carotid artery stenosis 11/10/2020   Chronic kidney disease 11/10/2020   Left basal ganglia embolic stroke ([place]) [id number]   PAC (premature atrial contraction)    Pre-procedural cardiovascular examination    Ischemic stroke ([place]) 10/20/2020   TIA (transient ischemic attack) 10/19/2020   Right hemiparesis ([place]) 10/19/2020   Type 2 diabetes mellitus with stage 3 chronic kidney disease, without long-term current use of insulin ([place]) 08/24/2018   Hyperlipidemia, unspecified 07/19/2017   Hypertension 07/19/2017   PUD (peptic ulcer disease) 07/19/2017   Type 2 diabetes mellitus ([place]) 07/19/2017   Personal history of gout 08/12/2016   Anemia, unspecified 04/09/2016   Hypothyroidism, acquired 12/10/2015   Cassandra Schaefer, OTR/L, CLT  Arlicia Paquette, OT/L 03/07/2021, 9:40 PM  [place] MAIN [facility] SERVICES [address] [place], [place], [address] Phone: [phone]   Fax:  [phone]  Name: Cassandra Schaefer MRN: [id number] Date of Birth: [birth year]

## 2021-03-09 ENCOUNTER — Encounter: Payer: Medicare HMO | Admitting: Speech Pathology

## 2021-03-09 ENCOUNTER — Other Ambulatory Visit: Payer: Self-pay

## 2021-03-09 ENCOUNTER — Ambulatory Visit: Payer: Medicare HMO

## 2021-03-09 DIAGNOSIS — R2689 Other abnormalities of gait and mobility: Secondary | ICD-10-CM

## 2021-03-09 DIAGNOSIS — M6281 Muscle weakness (generalized): Secondary | ICD-10-CM

## 2021-03-09 DIAGNOSIS — R278 Other lack of coordination: Secondary | ICD-10-CM

## 2021-03-09 DIAGNOSIS — R2681 Unsteadiness on feet: Secondary | ICD-10-CM

## 2021-03-09 NOTE — Therapy (Addendum)
[place] MAIN [facility] SERVICES [address] [place], [place], [address] Phone: [phone]   Fax:  [phone]  Physical Therapy Treatment  Patient Details  Name: Cassandra Schaefer MRN: [id number] Date of Birth: March 01, 1929 Referring Provider (PT): Dr. Dew/Dr. Ginette Pitman PCP   Encounter Date: 03/09/2021   PT End of Session - 03/09/21 1609     Visit Number 23    Number of Visits 38    Date for PT Re-Evaluation 04/27/21    Authorization Type [employer]    Authorization Time Period 02/02/21-04/27/21    Progress Note Due on Visit 20    PT Start Time 1516    PT Stop Time 1600    PT Time Calculation (min) 44 min    Equipment Utilized During Treatment Gait belt    Activity Tolerance Patient tolerated treatment well    Behavior During Therapy WFL for tasks assessed/performed             Past Medical History:  Diagnosis Date   Anemia    Cough    RESOLVING, NO FEVER   Diabetes mellitus without complication ([place])    Gout    Hypertension    Hypothyroidism    PUD (peptic ulcer disease)    Stroke [facility])    Wears dentures    full upper, partial lower    Past Surgical History:  Procedure Laterality Date   AMPUTATION TOE Left 12/14/2017   Procedure: AMPUTATION TOE-4TH MPJ;  Surgeon: Samara Deist, DPM;  Location: [place];  Service: Podiatry;  Laterality: Left;  IVA LOCAL Diabetic - oral meds   APPENDECTOMY     CATARACT EXTRACTION W/PHACO Right 06/23/2015   Procedure: CATARACT EXTRACTION PHACO AND INTRAOCULAR LENS PLACEMENT (IOC);  Surgeon: Estill Cotta, MD;  Location: [facility];  Service: Ophthalmology;  Laterality: Right;  [place] 01:28 AP% 23.4 CDE 36.14 fluid pack lot # [id number]   CATARACT EXTRACTION W/PHACO Left 07/28/2015   Procedure: CATARACT EXTRACTION PHACO AND INTRAOCULAR LENS PLACEMENT (IOC);  Surgeon: Estill Cotta, MD;  Location: [facility];  Service: Ophthalmology;  Laterality: Left;  [place]    1:29.7 AP%  24.9 CDE    41.32 fluid casette lot #[id number] H exp 09/23/2016   COONOSCOPY     AND ENDOSCOPY   DILATION AND CURETTAGE OF UTERUS     ENDARTERECTOMY Left 11/13/2020   Procedure: Evacuation of left neck hematoma;  Surgeon: Katha Cabal, MD;  Location: [facility];  Service: Vascular;  Laterality: Left;   ENDARTERECTOMY Left 11/13/2020   Procedure: ENDARTERECTOMY CAROTID;  Surgeon: Algernon Huxley, MD;  Location: [facility];  Service: Vascular;  Laterality: Left;   TONSILLECTOMY     TUBAL LIGATION      There were no vitals filed for this visit.   Subjective Assessment - 03/09/21 1518     Subjective Pt reports no major changes. Denies any pain today and no recent falls or losses of balance.    Patient is accompained by: Family member   Daughter, [name]   Pertinent History 87 y.o. female with medical history significant for type 2 diabetes mellitus, essential hypertension, acquired hypothyroidism, hyperlipidemia, stage III chronic kidney disease reports increased right sided weakness on 10/19/20. She was diagnosed with left basal ganglia ischemic stroke. She was discharged to inpatient rehab for 10 days and then discharged home. Patient was evaluated by outpatient PT on 11/12/20. CVA was due to carotid stenosis. She underwent left carotid endartectomy on 7/21. Procedure went well however patient suffered from  left cervical hematoma and had subsequent surgical procedure to stop the bleed and remove the hematoma on 11/13/20. They were concerned with her breathing and therefore intubated her for 1 day, she was in ICU for 2 days and transferred to med/surge on 11/15/20. Patient was discharged home on 11/17/20 and is now returning to outpatient PT. She has had the stiches removed and is healing well. Denies any neck pain or stiffness. She lives with her daughter and has 24/7 caregiver support. She is still using RW for most ambulation. She is able to transfer to bedside commode well and is able to stand short time unsupported. She  reports feeling very weak and fatigued. She reports she has been dragging her right foot more. She denies any numbness/tingling; She reports feeling like her legs are wanting to do more but is hesitant because of fear of falling. She is mod I for self care morning routine. She does still receive help with showering. She has been working on increasing her activity but denies any formal exercise.    Limitations Standing;Walking    How long can you sit comfortably? NA    How long can you stand comfortably? 30-60 min with support    How long can you walk comfortably? about 100 feet with RW, still limited to short distances;    Diagnostic tests MRI shows left basal ganglia infarct 10/21/20    Patient Stated Goals "Improve balance and walking and not need RW, get back to independent."    Pain Onset In the past 7 days                INTERVENTION   Therex:   Progressed STS to 2 x 5 with no UE support. Pt perceived exercise as medium. VC for decreasing right knee valgus upon standing. Mirror provided in front of pt to improve form.   LAQ with 3 sec isometric at end range, wearing 3# weights on BLEs 2 x 12 reps   Standing marches 2 x 12 each side with 3# weights on BLEs. Pt rated difficulty as hard.   Standing hip extension 2 x 12 each side with 3# weights on BLEs. Cuing for standing straight and reducing sway compensation.   Ambulation x approx. 592 ft with 2# ankle weights on BLEs to improve endurance and LE strength. VC on heel-toe sequencing and increased dorsiflexion for improved foot clearance, heel strike, and longer step length     Neuromuscular Re-ed:   SLB progression with one foot on airex and the other on 6" step 2x45 sec each side. Pt  utilizes ankle strategy on the primary stance leg. Pt with one LOB requiring UE support to regain balance (right foot on airex)  - progressed to with added cognitive task 2 x 45 seconds each side. Pt shows increased anterior/posterior sway but no  LOB. Pt improves with repetition.      Pt educated throughout session about proper posture and technique with exercises. Improved exercise technique, movement at target joints, use of target muscles after min to mod verbal, visual, tactile cues.     PT Education - 03/09/21 1608     Education Details exercise technique, body mechanics, gait mechanics    Person(s) Educated Patient    Methods Explanation;Verbal cues;Tactile cues;Demonstration    Comprehension Verbalized understanding;Returned demonstration;Verbal cues required;Tactile cues required;Need further instruction              PT Short Term Goals - 03/02/21 1659       PT  SHORT TERM GOAL #1   Title Patient will be adherent to HEP at least 3x a week to improve functional strength and balance for better safety at home.    Baseline 9/21: Pt reports compliance with HEP and is confident    Time 4    Period Weeks    Status Achieved    Target Date 12/31/20      PT SHORT TERM GOAL #2   Title Patient will be independent in transferring sit<>Stand without pushing on arm rests to improve ability to get up from chair.    Baseline 9/21: pt able to complete STS without use of UEs    Time 4    Period Weeks    Status Achieved    Target Date 12/31/20               PT Long Term Goals - 03/02/21 1700       PT LONG TERM GOAL #1   Title Patient (> 35 years old) will complete five times sit to stand test in < 15 seconds indicating an increased LE strength and improved balance.    Baseline 9/21: 29 seconds hands-free 10/10: deferred 10/12: 34 sec hands free; 11/2: 23.8 sec hands-free    Time 8    Period Weeks    Status On-going    Target Date 04/27/21      PT LONG TERM GOAL #2   Title Patient will increase six minute walk test distance to >400 feet for improve gait ability    Baseline 9/21: 368 ft with RW; 10/10: deferred 10/12: 408 ft; 11/2: 476 ft with 4WW    Time 8    Period Weeks    Status Achieved    Target Date  04/27/21      PT LONG TERM GOAL #3   Title Patient will increase 10 meter walk test to >1.83ms as to improve gait speed for better community ambulation and to reduce fall risk.    Baseline 9/21: 0.5 m/s with RW; 10/10 deferred 10/12: 0.93 m/s with 4WW; 11/2: 0.83 m/s with 4WW    Time 8    Period Weeks    Status Partially Met    Target Date 04/27/21      PT LONG TERM GOAL #4   Title Patient will be independent with ascend/descend 6 steps using single UE in step over step pattern without LOB.    Baseline 9/21:  Patient uses PT stairs (4 steps) with bilateral upper extremity support on handrails for both ascending/descending.  Patient exhibits reciprocal pattern ascending and step to pattern descending. 10/10: deferred; 10/12: ascending/descending recip. steps with BUE support; 11/2: Ascended/descended 8 steps total, reciprocal pattern ascending and step-to descending. Pt uses UUE support throughout with CGA assist.    Time 8    Period Weeks    Status Partially Met    Target Date 04/27/21      PT LONG TERM GOAL #5   Title Patient will demonstrate an improved Berg Balance Score of >45/56 as to demonstrate improved balance with ADLs such as sitting/standing and transfer balance and reduced fall risk.    Baseline 9/21: deferred d/t time; 9/29: 42/56; 10/10: deferred; 10/12: 44/56; 11/2: deferred; 11/7: 46/56    Time 8    Period Weeks    Status Achieved    Target Date 04/27/21      PT LONG TERM GOAL #6   Title Patient will improve FOTO score to >60% to indicate improved functional mobility with ADLs.  Baseline 9/21: 59; 10/10: 57; 11/2: 60    Time 8    Period Weeks    Status Partially Met    Target Date 04/27/21                   Plan - 03/09/21 1609     Clinical Impression Statement Pt was highly motivated for all interventions today. She progressed her ambulation to approximately 592 ft showing improvements in walking distance and endurance. Pt progressed strengthening  exercises to using 3 pound ankle weights. Pt was the most challenged today by her single leg stance progression with added dual task. the pt will benefit from further skilled PT to improve strength, gait, endurance, and balance to increase ease and safety with ADLs.    Personal Factors and Comorbidities Age    Comorbidities HTN, CKD, fall risk, type 2 diabetes,    Examination-Activity Limitations Lift;Locomotion Level;Squat;Stairs;Stand;Toileting;Transfers;Bathing    Examination-Participation Restrictions Cleaning;Community Activity;Laundry;Meal Prep;Shop;Volunteer;Driving    Stability/Clinical Decision Making Stable/Uncomplicated    Rehab Potential Poor    PT Frequency 2x / week    PT Duration 12 weeks    PT Treatment/Interventions Cryotherapy;Electrical Stimulation;Moist Heat;Gait training;Stair training;Functional mobility training;Therapeutic activities;Therapeutic exercise;Neuromuscular re-education;Balance training;Patient/family education;Orthotic Fit/Training;Energy conservation    PT Next Visit Plan work on LE strengthening and balance; gait training, endurance    PT Home Exercise Plan Access Code: [email or web address]; no updates    Consulted and Agree with Plan of Care Patient             Patient will benefit from skilled therapeutic intervention in order to improve the following deficits and impairments:  Abnormal gait, Decreased balance, Decreased endurance, Decreased mobility, Difficulty walking, Cardiopulmonary status limiting activity, Decreased activity tolerance, Decreased coordination, Decreased strength  Visit Diagnosis: Muscle weakness (generalized)  Unsteadiness on feet  Other lack of coordination  Other abnormalities of gait and mobility     Problem List Patient Active Problem List   Diagnosis Date Noted   Carotid stenosis, symptomatic, with infarction ([place]) 11/13/2020   Gout flare 11/10/2020   UTI (urinary tract infection) 11/10/2020   Left carotid artery stenosis  11/10/2020   Chronic kidney disease 11/10/2020   Left basal ganglia embolic stroke ([place]) 88/41/6606   PAC (premature atrial contraction)    Pre-procedural cardiovascular examination    Ischemic stroke ([place]) 10/20/2020   TIA (transient ischemic attack) 10/19/2020   Right hemiparesis ([place]) 10/19/2020   Type 2 diabetes mellitus with stage 3 chronic kidney disease, without long-term current use of insulin ([place]) 08/24/2018   Hyperlipidemia, unspecified 07/19/2017   Hypertension 07/19/2017   PUD (peptic ulcer disease) 07/19/2017   Type 2 diabetes mellitus ([place]) 07/19/2017   Personal history of gout 08/12/2016   Anemia, unspecified 04/09/2016   Hypothyroidism, acquired 12/10/2015  Ricard Dillon PT, DPT This entire session was performed under direct supervision and direction of a licensed therapist/therapist assistant . I have personally read, edited and approve of the note as written.   Karn Cassis, SPT 03/09/2021, 4:37 PM  [place] MAIN [facility] SERVICES [address] [place], [place], [address] Phone: [phone]   Fax:  [phone]  Name: Cassandra Schaefer MRN: [id number] Date of Birth: 1928/07/06

## 2021-03-09 NOTE — Therapy (Signed)
[place] [facility] MAIN [facility] SERVICES [address] [place], [place], [address] Phone: [phone]   Fax:  [phone]  Occupational Therapy Treatment  Patient Details  Name: Cassandra Schaefer MRN: [id number] Date of Birth: 01-Jan-1929 Referring Provider (OT): Dr. Barbette Reichmann   Encounter Date: 03/09/2021   OT End of Session - 03/09/21 1630     Visit Number 26    Number of Visits 40    Date for OT Re-Evaluation 04/21/21    Authorization Time Period Reporting period starting 01/07/2021    OT Start Time 1600    OT Stop Time 1645    OT Time Calculation (min) 45 min    Equipment Utilized During Treatment tranport chair    Activity Tolerance Patient tolerated treatment well    Behavior During Therapy WFL for tasks assessed/performed             Past Medical History:  Diagnosis Date   Anemia    Cough    RESOLVING, NO FEVER   Diabetes mellitus without complication ([facility])    Gout    Hypertension    Hypothyroidism    PUD (peptic ulcer disease)    Stroke [facility])    Wears dentures    full upper, partial lower    Past Surgical History:  Procedure Laterality Date   AMPUTATION TOE Left 12/14/2017   Procedure: AMPUTATION TOE-4TH MPJ;  Surgeon: Gwyneth Revels, DPM;  Location: [facility];  Service: Podiatry;  Laterality: Left;  IVA LOCAL Diabetic - oral meds   APPENDECTOMY     CATARACT EXTRACTION W/PHACO Right 06/23/2015   Procedure: CATARACT EXTRACTION PHACO AND INTRAOCULAR LENS PLACEMENT (IOC);  Surgeon: Sallee Lange, MD;  Location: [facility];  Service: Ophthalmology;  Laterality: Right;  [place] 01:28 AP% 23.4 CDE 36.14 fluid pack lot # [id number]   CATARACT EXTRACTION W/PHACO Left 07/28/2015   Procedure: CATARACT EXTRACTION PHACO AND INTRAOCULAR LENS PLACEMENT (IOC);  Surgeon: Sallee Lange, MD;  Location: [facility];  Service: Ophthalmology;  Laterality: Left;  [place]    1:29.7 AP%  24.9 CDE   41.32 fluid casette lot #[id number] H exp  09/23/2016   COONOSCOPY     AND ENDOSCOPY   DILATION AND CURETTAGE OF UTERUS     ENDARTERECTOMY Left 11/13/2020   Procedure: Evacuation of left neck hematoma;  Surgeon: Renford Dills, MD;  Location: [facility];  Service: Vascular;  Laterality: Left;   ENDARTERECTOMY Left 11/13/2020   Procedure: ENDARTERECTOMY CAROTID;  Surgeon: Annice Needy, MD;  Location: [facility];  Service: Vascular;  Laterality: Left;   TONSILLECTOMY     TUBAL LIGATION      There were no vitals filed for this visit.   Subjective Assessment - 03/09/21 1628     Subjective  Pt reports working on card activities at home since last session.    Patient is accompanied by: Family member    Pertinent History R ankle pain, gout, T2DM, HTN, CKDIII; R/L carotid endarectomy    Patient Stated Goals "I want to improve my aim when I'm using my R arm."    Currently in Pain? No/denies    Pain Score 0-No pain    Pain Onset In the past 7 days            Occupational Therapy Treatment: Neuro re-ed: Facilitation of object manipulation within fingertips of R hand using grooved pegs and pegboard.  Pt with improved ability to turn pegs within fingertips to match up the grooves, and improved ability to  rotate and slide peg for improved pinch within fingertips.  Practiced rotating pegs 180 degrees within fingertips, but this was challenging with short grooved peg, resulting in frequent dropping, so transitioned to practicing rotating with pen.  Able to rotate pen with extra time and vc for technique.  With whiteboard on table top, completed tracing, drawing circles, x's, zig-zag lines using RUE to target [place].    Response to Treatment: Pt improving ability to manipulate small items within fingertips of R hand, occasional dropping of small items.  Pt still occasionally knocks near items over when reaching, and pt reports she is working to slow down her movements for better accuracy.  Pt continues to develop RUE [facility] and [place] skills for  improved efficiency and accuracy with self care tasks.      OT Education - 03/09/21 1630     Education Details fine motor coordination exercises, functional hand skills    Person(s) Educated Patient    Methods Explanation;Verbal cues;Demonstration    Comprehension Verbalized understanding;Returned demonstration;Verbal cues required              OT Short Term Goals - 01/28/21 1644       OT SHORT TERM GOAL #1   Title Pt will perform HEP for RUE strength/coordination independently.    Baseline Eval: HEP not yet established in outpatient, but pt is working with putty from [name]; 12/04/2020; [facility] HEP initiated this day with mod vc after return demo; 01/05/2021: indep with currently program, but ongoing as pt progresses; 01/28/2021: vc to revisit and focus on grip and pinch strengthening with putty    Time 6    Period Weeks    Status On-going    Target Date 03/10/21               OT Long Term Goals - 02/11/21 1635       OT LONG TERM GOAL #1   Title Pt will improve hand writing to 100% legibility with R hand to be able to independently write a check.    Baseline Eval: signature is 75% legible with R hand, requiring extra time; 12/04/2020: no change from eval; 01/05/2021: Printing is 100% legible, signature is 90% legible, but still requires extra time and effort.; 01/26/21: Signature is 90% legible and speed/fluidity have improved. 20th visit: 90% legible.    Time 12    Period Weeks    Status On-going    Target Date 04/21/21      OT LONG TERM GOAL #2   Title Pt will improve R hand coordination to enable good manipulation of iphone using R dominant hand    Baseline Eval: Pt uses L hand d/t R hand lacking sufficient [place] to press buttons on phone; 12/04/2020: no change from eval; 01/05/2021: R hand coordination is improving but pt still uses L hand to dial phone; 01/28/21: Pt using R dominant hand to press buttons and apps on phone 50% of the time.20th visit: Right hand [facility] skilols are  improving, Pt. is improving with manipulation of the iphone.    Time 12    Period Weeks    Status On-going    Target Date 04/21/21      OT LONG TERM GOAL #3   Title Pt will improve GMC throughout RUE to enable pt to reach for and pick up ADL supplies with R dominant hand without dropping or knocking over objects.    Baseline Eval: Pt reports RUE is clumsy and easily knocks over objects when trying to reach  for ADL supplies.  Pt verbalizes that her "aim is off."; 12/04/2020: pt reports slight improvement since eval and has started using her R hand to eat, but still feels clumsy; 01/05/2021: Greatly improved; pt is using silverware to eat with R hand, can comb hair with RUE, still mildly ataxic; 01/26/21: pt reports difficulty picking up objects within a small space (ie; may knock the toothpaste holder down when reaching for toothbrush), but reaching for light/larger objects is improving 20th visit: pt. continues to be mildly ataxic, however is consistently improving with motor control, and [place] while reaching targets.    Time 12    Period Weeks    Status On-going    Target Date 04/21/21      OT LONG TERM GOAL #4   Title Pt will improve FOTO score to 68 or better to indicate measurable functional improvement.    Baseline Eval: FOTO 55; 01/05/2021: FOTO 61; 01/28/21: FOTO 61, 20th visit: 61    Time 12    Period Weeks    Status On-going    Target Date 04/21/21              Plan - 03/09/21 1657     Clinical Impression Statement Pt improving ability to manipulate small items within fingertips of R hand, occasional dropping of small items.  Pt still occasionally knocks near items over when reaching, and pt reports she is working to slow down her movements for better accuracy.  Pt continues to develop RUE [facility] and [place] skills for improved efficiency and accuracy with self care tasks.    OT Occupational Profile and History Detailed Assessment- Review of Records and additional review of physical,  cognitive, psychosocial history related to current functional performance    Occupational performance deficits (Please refer to evaluation for details): ADL's;Leisure;IADL's    Body Structure / Function / Physical Skills ADL;Coordination;Endurance;GMC;UE functional use;Balance;Sensation;Body mechanics;IADL;Pain;Dexterity;FMC;Strength;Gait;Mobility    Rehab Potential Good    Clinical Decision Making Several treatment options, min-mod task modification necessary    Comorbidities Affecting Occupational Performance: May have comorbidities impacting occupational performance    Modification or Assistance to Complete Evaluation  Min-Moderate modification of tasks or assist with assess necessary to complete eval    OT Frequency 2x / week    OT Duration 12 weeks    OT Treatment/Interventions Self-care/ADL training;Therapeutic exercise;DME and/or AE instruction;Functional Mobility Training;Balance training;Neuromuscular education;Therapeutic activities;Patient/family education    Consulted and Agree with Plan of Care Patient             Patient will benefit from skilled therapeutic intervention in order to improve the following deficits and impairments:   Body Structure / Function / Physical Skills: ADL, Coordination, Endurance, GMC, UE functional use, Balance, Sensation, Body mechanics, IADL, Pain, Dexterity, FMC, Strength, Gait, Mobility       Visit Diagnosis: Muscle weakness (generalized)  Other lack of coordination    Problem List Patient Active Problem List   Diagnosis Date Noted   Carotid stenosis, symptomatic, with infarction ([place]) 11/13/2020   Gout flare 11/10/2020   UTI (urinary tract infection) 11/10/2020   Left carotid artery stenosis 11/10/2020   Chronic kidney disease 11/10/2020   Left basal ganglia embolic stroke ([place]) [id number]   PAC (premature atrial contraction)    Pre-procedural cardiovascular examination    Ischemic stroke ([place]) 10/20/2020   TIA (transient  ischemic attack) 10/19/2020   Right hemiparesis ([place]) 10/19/2020   Type 2 diabetes mellitus with stage 3 chronic kidney disease, without long-term current use of insulin ([place])  08/24/2018   Hyperlipidemia, unspecified 07/19/2017   Hypertension 07/19/2017   PUD (peptic ulcer disease) 07/19/2017   Type 2 diabetes mellitus ([place]) 07/19/2017   Personal history of gout 08/12/2016   Anemia, unspecified 04/09/2016   Hypothyroidism, acquired 12/10/2015   Leta Speller, MS, OTR/L  Darleene Cleaver, OT/L 03/09/2021, 4:58 PM  [place] MAIN [facility] SERVICES [address] [place], [place], [address] Phone: [phone]   Fax:  [phone]  Name: Kawanza Loureiro MRN: [id number] Date of Birth: Aug 04, 1928

## 2021-03-11 ENCOUNTER — Other Ambulatory Visit: Payer: Self-pay

## 2021-03-11 ENCOUNTER — Ambulatory Visit: Payer: Medicare HMO

## 2021-03-11 ENCOUNTER — Encounter: Payer: Medicare HMO | Admitting: Speech Pathology

## 2021-03-11 DIAGNOSIS — R278 Other lack of coordination: Secondary | ICD-10-CM

## 2021-03-11 DIAGNOSIS — R269 Unspecified abnormalities of gait and mobility: Secondary | ICD-10-CM

## 2021-03-11 DIAGNOSIS — R2681 Unsteadiness on feet: Secondary | ICD-10-CM

## 2021-03-11 DIAGNOSIS — R262 Difficulty in walking, not elsewhere classified: Secondary | ICD-10-CM

## 2021-03-11 DIAGNOSIS — M6281 Muscle weakness (generalized): Secondary | ICD-10-CM

## 2021-03-11 DIAGNOSIS — R2689 Other abnormalities of gait and mobility: Secondary | ICD-10-CM | POA: Diagnosis not present

## 2021-03-11 NOTE — Therapy (Addendum)
[place] MAIN [facility] SERVICES [address] [place], [place], [address] Phone: [phone]   Fax:  [phone]  Physical Therapy Treatment  Patient Details  Name: Cassandra Schaefer MRN: [id number] Date of Birth: 10/23/1928 Referring Provider (PT): Dr. Dew/Dr. Ginette Pitman PCP   Encounter Date: 03/11/2021   PT End of Session - 03/11/21 1608     Visit Number 24    Number of Visits 38    Date for PT Re-Evaluation 04/27/21    Authorization Type [employer]    Authorization Time Period 02/02/21-04/27/21    Progress Note Due on Visit 20    PT Start Time 1517    PT Stop Time 1600    PT Time Calculation (min) 43 min    Equipment Utilized During Treatment Gait belt    Activity Tolerance Patient tolerated treatment well    Behavior During Therapy WFL for tasks assessed/performed             Past Medical History:  Diagnosis Date   Anemia    Cough    RESOLVING, NO FEVER   Diabetes mellitus without complication ([place])    Gout    Hypertension    Hypothyroidism    PUD (peptic ulcer disease)    Stroke [facility])    Wears dentures    full upper, partial lower    Past Surgical History:  Procedure Laterality Date   AMPUTATION TOE Left 12/14/2017   Procedure: AMPUTATION TOE-4TH MPJ;  Surgeon: Samara Deist, DPM;  Location: [place];  Service: Podiatry;  Laterality: Left;  IVA LOCAL Diabetic - oral meds   APPENDECTOMY     CATARACT EXTRACTION W/PHACO Right 06/23/2015   Procedure: CATARACT EXTRACTION PHACO AND INTRAOCULAR LENS PLACEMENT (IOC);  Surgeon: Estill Cotta, MD;  Location: [facility];  Service: Ophthalmology;  Laterality: Right;  [place] 01:28 AP% 23.4 CDE 36.14 fluid pack lot # [id number]   CATARACT EXTRACTION W/PHACO Left 07/28/2015   Procedure: CATARACT EXTRACTION PHACO AND INTRAOCULAR LENS PLACEMENT (IOC);  Surgeon: Estill Cotta, MD;  Location: [facility];  Service: Ophthalmology;  Laterality: Left;  [place]    1:29.7 AP%  24.9 CDE    41.32 fluid casette lot #[id number] H exp 09/23/2016   COONOSCOPY     AND ENDOSCOPY   DILATION AND CURETTAGE OF UTERUS     ENDARTERECTOMY Left 11/13/2020   Procedure: Evacuation of left neck hematoma;  Surgeon: Katha Cabal, MD;  Location: [facility];  Service: Vascular;  Laterality: Left;   ENDARTERECTOMY Left 11/13/2020   Procedure: ENDARTERECTOMY CAROTID;  Surgeon: Algernon Huxley, MD;  Location: [facility];  Service: Vascular;  Laterality: Left;   TONSILLECTOMY     TUBAL LIGATION      There were no vitals filed for this visit.   Subjective Assessment - 03/11/21 1516     Subjective Pt reports no changes. She reports no new falls and denies any pain    Patient is accompained by: Family member   Daughter, [name]   Pertinent History 87 y.o. female with medical history significant for type 2 diabetes mellitus, essential hypertension, acquired hypothyroidism, hyperlipidemia, stage III chronic kidney disease reports increased right sided weakness on 10/19/20. She was diagnosed with left basal ganglia ischemic stroke. She was discharged to inpatient rehab for 10 days and then discharged home. Patient was evaluated by outpatient PT on 11/12/20. CVA was due to carotid stenosis. She underwent left carotid endartectomy on 7/21. Procedure went well however patient suffered from left cervical hematoma and  had subsequent surgical procedure to stop the bleed and remove the hematoma on 11/13/20. They were concerned with her breathing and therefore intubated her for 1 day, she was in ICU for 2 days and transferred to med/surge on 11/15/20. Patient was discharged home on 11/17/20 and is now returning to outpatient PT. She has had the stiches removed and is healing well. Denies any neck pain or stiffness. She lives with her daughter and has 24/7 caregiver support. She is still using RW for most ambulation. She is able to transfer to bedside commode well and is able to stand short time unsupported. She reports feeling very weak  and fatigued. She reports she has been dragging her right foot more. She denies any numbness/tingling; She reports feeling like her legs are wanting to do more but is hesitant because of fear of falling. She is mod I for self care morning routine. She does still receive help with showering. She has been working on increasing her activity but denies any formal exercise.    Limitations Standing;Walking    How long can you sit comfortably? NA    How long can you stand comfortably? 30-60 min with support    How long can you walk comfortably? about 100 feet with RW, still limited to short distances;    Diagnostic tests MRI shows left basal ganglia infarct 10/21/20    Patient Stated Goals "Improve balance and walking and not need RW, get back to independent."    Pain Onset In the past 7 days              INTERVENTION   Therex:   Nustep interval training: 6 minutes total. Pt maintained SPM: 55-65 Level 1 for 1 minute Level 4 for 30 sec Level 1 for 1 minute Level 4 for 30 sec Level 1 for 1 minute (RPE: 4-6/10) Level 4 for 30 sec (RPE: 7-8/10) Level 1 for 1.5 minutes HR: 115 SPO2: 98% following intervention Distance travelled - 0.2 miles  STS to 2 x 5 with no UE support. Pt perceived exercise as hard. VC for decreasing right knee valgus upon standing. Mirror provided in front of pt to improve form. CGA for first set, some MinA required for second set.   LAQ with 3 sec isometric at end range, wearing 3# weights on BLEs 2 x 12 reps   Standing marches x 15 each side with 3# weights on BLEs. Pt rated difficulty as hard.   Ambulation x approx. 450 ft with CGA and 2WW for muscular endurance and strength. Intermittent VC on heel-toe sequencing and increased dorsiflexion to avoid R foot drag.     Neuromuscular Re-ed: all interventions with CGA unless otherwise specified   On airex pad:   NBOS eyes open x 30 sec. Pt displays minimal sway and rates as easy  WBOS eyes closed x 45 sec. Pt  displays minimal sway and no LOB  NBOS eyes closed 3 x 1 min. Pt displays increased anterior/posterior and lateral sway. Pt requires intermittent MinA from SPT to maintain upright posture and regain balance. Pt states she feels like she has improve in this intervention.  Semi-tandem stance 2 x 45 sec each side. Pt displays mild lateral sway with no LOB.  - Progressed to with horizontal head turns 2 x 30 sec.       Pt educated throughout session about proper posture and technique with exercises. Improved exercise technique, movement at target joints, use of target muscles after min to mod verbal, visual, tactile  cues.        PT Education - 03/11/21 1608     Education Details exercise technique, body mechanics    Person(s) Educated Patient    Methods Explanation;Demonstration;Verbal cues;Tactile cues    Comprehension Verbalized understanding;Returned demonstration;Verbal cues required;Need further instruction;Tactile cues required              PT Short Term Goals - 03/02/21 1659       PT SHORT TERM GOAL #1   Title Patient will be adherent to HEP at least 3x a week to improve functional strength and balance for better safety at home.    Baseline 9/21: Pt reports compliance with HEP and is confident    Time 4    Period Weeks    Status Achieved    Target Date 12/31/20      PT SHORT TERM GOAL #2   Title Patient will be independent in transferring sit<>Stand without pushing on arm rests to improve ability to get up from chair.    Baseline 9/21: pt able to complete STS without use of UEs    Time 4    Period Weeks    Status Achieved    Target Date 12/31/20               PT Long Term Goals - 03/02/21 1700       PT LONG TERM GOAL #1   Title Patient (> 55 years old) will complete five times sit to stand test in < 15 seconds indicating an increased LE strength and improved balance.    Baseline 9/21: 29 seconds hands-free 10/10: deferred 10/12: 34 sec hands free; 11/2:  23.8 sec hands-free    Time 8    Period Weeks    Status On-going    Target Date 04/27/21      PT LONG TERM GOAL #2   Title Patient will increase six minute walk test distance to >400 feet for improve gait ability    Baseline 9/21: 368 ft with RW; 10/10: deferred 10/12: 408 ft; 11/2: 476 ft with 4WW    Time 8    Period Weeks    Status Achieved    Target Date 04/27/21      PT LONG TERM GOAL #3   Title Patient will increase 10 meter walk test to >1.57ms as to improve gait speed for better community ambulation and to reduce fall risk.    Baseline 9/21: 0.5 m/s with RW; 10/10 deferred 10/12: 0.93 m/s with 4WW; 11/2: 0.83 m/s with 4WW    Time 8    Period Weeks    Status Partially Met    Target Date 04/27/21      PT LONG TERM GOAL #4   Title Patient will be independent with ascend/descend 6 steps using single UE in step over step pattern without LOB.    Baseline 9/21:  Patient uses PT stairs (4 steps) with bilateral upper extremity support on handrails for both ascending/descending.  Patient exhibits reciprocal pattern ascending and step to pattern descending. 10/10: deferred; 10/12: ascending/descending recip. steps with BUE support; 11/2: Ascended/descended 8 steps total, reciprocal pattern ascending and step-to descending. Pt uses UUE support throughout with CGA assist.    Time 8    Period Weeks    Status Partially Met    Target Date 04/27/21      PT LONG TERM GOAL #5   Title Patient will demonstrate an improved Berg Balance Score of >45/56 as to demonstrate improved balance with ADLs such  as sitting/standing and transfer balance and reduced fall risk.    Baseline 9/21: deferred d/t time; 9/29: 42/56; 10/10: deferred; 10/12: 44/56; 11/2: deferred; 11/7: 46/56    Time 8    Period Weeks    Status Achieved    Target Date 04/27/21      PT LONG TERM GOAL #6   Title Patient will improve FOTO score to >60% to indicate improved functional mobility with ADLs.    Baseline 9/21: 59; 10/10:  57; 11/2: 60    Time 8    Period Weeks    Status Partially Met    Target Date 04/27/21                   Plan - 03/11/21 1610     Clinical Impression Statement Pt was very pleasant and motivated for all interventions today. Pt tolerated interval training on the nustep well and reported feeling fatigued following. She continued to progress balance and stated improved confidence in balance interventions with eyes closed. She was the most challenged by balance interventions on compliant surfaces with eyes closed. Pt will benefit from further skilled Pt to improve strength, gait, endurance, and balance to increase ease and safety with ADLs.    Personal Factors and Comorbidities Age    Comorbidities HTN, CKD, fall risk, type 2 diabetes,    Examination-Activity Limitations Lift;Locomotion Level;Squat;Stairs;Stand;Toileting;Transfers;Bathing    Examination-Participation Restrictions Cleaning;Community Activity;Laundry;Meal Prep;Shop;Volunteer;Driving    Stability/Clinical Decision Making Stable/Uncomplicated    Rehab Potential Poor    PT Frequency 2x / week    PT Duration 12 weeks    PT Treatment/Interventions Cryotherapy;Electrical Stimulation;Moist Heat;Gait training;Stair training;Functional mobility training;Therapeutic activities;Therapeutic exercise;Neuromuscular re-education;Balance training;Patient/family education;Orthotic Fit/Training;Energy conservation    PT Next Visit Plan work on LE strengthening and balance; gait training, endurance    PT Home Exercise Plan Access Code: [email or web address]; no updates    Consulted and Agree with Plan of Care Patient             Patient will benefit from skilled therapeutic intervention in order to improve the following deficits and impairments:  Abnormal gait, Decreased balance, Decreased endurance, Decreased mobility, Difficulty walking, Cardiopulmonary status limiting activity, Decreased activity tolerance, Decreased coordination, Decreased  strength  Visit Diagnosis: Muscle weakness (generalized)  Unsteadiness on feet  Difficulty in walking, not elsewhere classified  Abnormality of gait and mobility     Problem List Patient Active Problem List   Diagnosis Date Noted   Carotid stenosis, symptomatic, with infarction ([place]) 11/13/2020   Gout flare 11/10/2020   UTI (urinary tract infection) 11/10/2020   Left carotid artery stenosis 11/10/2020   Chronic kidney disease 11/10/2020   Left basal ganglia embolic stroke ([place]) 10/17/7626   PAC (premature atrial contraction)    Pre-procedural cardiovascular examination    Ischemic stroke ([place]) 10/20/2020   TIA (transient ischemic attack) 10/19/2020   Right hemiparesis ([place]) 10/19/2020   Type 2 diabetes mellitus with stage 3 chronic kidney disease, without long-term current use of insulin ([place]) 08/24/2018   Hyperlipidemia, unspecified 07/19/2017   Hypertension 07/19/2017   PUD (peptic ulcer disease) 07/19/2017   Type 2 diabetes mellitus ([place]) 07/19/2017   Personal history of gout 08/12/2016   Anemia, unspecified 04/09/2016   Hypothyroidism, acquired 12/10/2015   Ricard Dillon PT, DPT This entire session was performed under direct supervision and direction of a licensed therapist/therapist assistant . I have personally read, edited and approve of the note as written.   Karn Cassis, SPT 03/12/2021, 9:46 AM  [place]  [facility] MAIN [facility] SERVICES [address] [place], [place], [address] Phone: [phone]   Fax:  [phone]  Name: Cassandra Schaefer MRN: [id number] Date of Birth: 1929-01-09

## 2021-03-11 NOTE — Therapy (Signed)
[place] MAIN [facility] SERVICES [address] [place], [place], [address] Phone: [phone]   Fax:  [phone]  Occupational Therapy Treatment  Patient Details  Name: Cassandra Schaefer MRN: [id number] Date of Birth: [birth year] Referring Provider (OT): Dr. Tracie Harrier   Encounter Date: 03/11/2021   OT End of Session - 03/11/21 1708     Visit Number 27    Number of Visits 40    Date for OT Re-Evaluation 04/21/21    Authorization Time Period Reporting period starting 01/07/2021    OT Start Time 1602    OT Stop Time 1646    OT Time Calculation (min) 44 min    Equipment Utilized During Treatment tranport chair    Activity Tolerance Patient tolerated treatment well    Behavior During Therapy WFL for tasks assessed/performed             Past Medical History:  Diagnosis Date   Anemia    Cough    RESOLVING, NO FEVER   Diabetes mellitus without complication ([place])    Gout    Hypertension    Hypothyroidism    PUD (peptic ulcer disease)    Stroke [facility])    Wears dentures    full upper, partial lower    Past Surgical History:  Procedure Laterality Date   AMPUTATION TOE Left 12/14/2017   Procedure: AMPUTATION TOE-4TH MPJ;  Surgeon: Samara Deist, DPM;  Location: [place];  Service: Podiatry;  Laterality: Left;  IVA LOCAL Diabetic - oral meds   APPENDECTOMY     CATARACT EXTRACTION W/PHACO Right 06/23/2015   Procedure: CATARACT EXTRACTION PHACO AND INTRAOCULAR LENS PLACEMENT (IOC);  Surgeon: Estill Cotta, MD;  Location: [facility];  Service: Ophthalmology;  Laterality: Right;  [place] 01:28 AP% 23.4 CDE 36.14 fluid pack lot # [id number]   CATARACT EXTRACTION W/PHACO Left 07/28/2015   Procedure: CATARACT EXTRACTION PHACO AND INTRAOCULAR LENS PLACEMENT (IOC);  Surgeon: Estill Cotta, MD;  Location: [facility];  Service: Ophthalmology;  Laterality: Left;  [place]    1:29.7 AP%  24.9 CDE   41.32 fluid casette lot [id number] H exp  09/23/2016   COONOSCOPY     AND ENDOSCOPY   DILATION AND CURETTAGE OF UTERUS     ENDARTERECTOMY Left 11/13/2020   Procedure: Evacuation of left neck hematoma;  Surgeon: Katha Cabal, MD;  Location: [facility];  Service: Vascular;  Laterality: Left;   ENDARTERECTOMY Left 11/13/2020   Procedure: ENDARTERECTOMY CAROTID;  Surgeon: Algernon Huxley, MD;  Location: [facility];  Service: Vascular;  Laterality: Left;   TONSILLECTOMY     TUBAL LIGATION      There were no vitals filed for this visit.   Subjective Assessment - 03/11/21 1707     Subjective  Pt verbalizes fatigue after finishing her PT session.    Patient is accompanied by: Family member    Pertinent History R ankle pain, gout, T2DM, HTN, CKDIII; R/L carotid endarectomy    Patient Stated Goals "I want to improve my aim when I'm using my R arm."    Currently in Pain? No/denies    Pain Score 0-No pain    Pain Onset In the past 7 days            Occupational Therapy Treatment: Therapeutic Exercise: Facilitated pinch strengthening with use of therapy resistant clothespins to target lateral and 3 point pinch for R hand.  Worked toward aiming at a target clipping all colors of clothespins onto numbers of yardstick, facilitated  shoulder flex and abduction with vertical placement of yardstick, then alternated to horizontal placement at table top level to aim with forward reach.  Used push pins for reaching toward target on cork board, fair accuracy and reported fatigue in fingers with pushing pins following clothespin activity.  Used EZ board to facilitate resisted gripping, pinching, wrist and digit flex/ext, forearm pron/sup for RUE.  Participated in coin manipulation activities with coin pick up from table, storage of coins within palm of hand, transferring coins from palm of hand to fingertips to enable discarding coins one at a time without dropping, requiring extra time.  Worked to [employer], requiring extra time, and then placing  coins into slotted bank.    Response to Treatment: R hand requires extra time for coin manipulation activities.  Improving with pinch strength, though black (heavy resistance) clothespins are still challenging.  Pt is improving with aiming at a target with outstretched arms, but still with mild RUE ataxia and pt must use caution to avoid knocking down near items when reaching for an object with RUE.  Pt continues to develop R hand grip, pinch, and coordination skills in order to maximize indep and efficiency with self care tasks when engaging RUE.      OT Education - 03/11/21 1708     Education Details fine motor coordination exercises, functional hand skills    Person(s) Educated Patient    Methods Explanation;Verbal cues;Demonstration    Comprehension Verbalized understanding;Returned demonstration;Verbal cues required              OT Short Term Goals - 01/28/21 1644       OT SHORT TERM GOAL #1   Title Pt will perform HEP for RUE strength/coordination independently.    Baseline Eval: HEP not yet established in outpatient, but pt is working with putty from [name]; 12/04/2020; [facility] HEP initiated this day with mod vc after return demo; 01/05/2021: indep with currently program, but ongoing as pt progresses; 01/28/2021: vc to revisit and focus on grip and pinch strengthening with putty    Time 6    Period Weeks    Status On-going    Target Date 03/10/21               OT Long Term Goals - 02/11/21 1635       OT LONG TERM GOAL #1   Title Pt will improve hand writing to 100% legibility with R hand to be able to independently write a check.    Baseline Eval: signature is 75% legible with R hand, requiring extra time; 12/04/2020: no change from eval; 01/05/2021: Printing is 100% legible, signature is 90% legible, but still requires extra time and effort.; 01/26/21: Signature is 90% legible and speed/fluidity have improved. 20th visit: 90% legible.    Time 12    Period Weeks    Status  On-going    Target Date 04/21/21      OT LONG TERM GOAL #2   Title Pt will improve R hand coordination to enable good manipulation of iphone using R dominant hand    Baseline Eval: Pt uses L hand d/t R hand lacking sufficient FMC to press buttons on phone; 12/04/2020: no change from eval; 01/05/2021: R hand coordination is improving but pt still uses L hand to dial phone; 01/28/21: Pt using R dominant hand to press buttons and apps on phone 50% of the time.20th visit: Right hand [facility] skilols are improving, Pt. is improving with manipulation of the iphone.  Time 12    Period Weeks    Status On-going    Target Date 04/21/21      OT LONG TERM GOAL #3   Title Pt will improve GMC throughout RUE to enable pt to reach for and pick up ADL supplies with R dominant hand without dropping or knocking over objects.    Baseline Eval: Pt reports RUE is clumsy and easily knocks over objects when trying to reach for ADL supplies.  Pt verbalizes that her "aim is off."; 12/04/2020: pt reports slight improvement since eval and has started using her R hand to eat, but still feels clumsy; 01/05/2021: Greatly improved; pt is using silverware to eat with R hand, can comb hair with RUE, still mildly ataxic; 01/26/21: pt reports difficulty picking up objects within a small space (ie; may knock the toothpaste holder down when reaching for toothbrush), but reaching for light/larger objects is improving 20th visit: pt. continues to be mildly ataxic, however is consistently improving with motor control, and FMC while reaching targets.    Time 12    Period Weeks    Status On-going    Target Date 04/21/21      OT LONG TERM GOAL #4   Title Pt will improve FOTO score to 68 or better to indicate measurable functional improvement.    Baseline Eval: FOTO 55; 01/05/2021: FOTO 61; 01/28/21: FOTO 61, 20th visit: 61    Time 12    Period Weeks    Status On-going    Target Date 04/21/21                   Plan - 03/11/21 1719      Clinical Impression Statement R hand requires extra time for coin manipulation activities.  Improving with pinch strength, though black (heavy resistance) clothespins are still challenging.  Pt is improving with aiming at a target with outstretched arms, but still with mild RUE ataxia and pt must use caution to avoid knocking down near items when reaching for an object with RUE.  Pt continues to develop R hand grip, pinch, and coordination skills in order to maximize indep and efficiency with self care tasks when engaging RUE.    OT Occupational Profile and History Detailed Assessment- Review of Records and additional review of physical, cognitive, psychosocial history related to current functional performance    Occupational performance deficits (Please refer to evaluation for details): ADL's;Leisure;IADL's    Body Structure / Function / Physical Skills ADL;Coordination;Endurance;GMC;UE functional use;Balance;Sensation;Body mechanics;IADL;Pain;Dexterity;FMC;Strength;Gait;Mobility    Rehab Potential Good    Clinical Decision Making Several treatment options, min-mod task modification necessary    Comorbidities Affecting Occupational Performance: May have comorbidities impacting occupational performance    Modification or Assistance to Complete Evaluation  Min-Moderate modification of tasks or assist with assess necessary to complete eval    OT Frequency 2x / week    OT Duration 12 weeks    OT Treatment/Interventions Self-care/ADL training;Therapeutic exercise;DME and/or AE instruction;Functional Mobility Training;Balance training;Neuromuscular education;Therapeutic activities;Patient/family education    Consulted and Agree with Plan of Care Patient             Patient will benefit from skilled therapeutic intervention in order to improve the following deficits and impairments:   Body Structure / Function / Physical Skills: ADL, Coordination, Endurance, GMC, UE functional use, Balance,  Sensation, Body mechanics, IADL, Pain, Dexterity, FMC, Strength, Gait, Mobility       Visit Diagnosis: Muscle weakness (generalized)  Other lack of coordination  Problem List Patient Active Problem List   Diagnosis Date Noted   Carotid stenosis, symptomatic, with infarction ([place]) 11/13/2020   Gout flare 11/10/2020   UTI (urinary tract infection) 11/10/2020   Left carotid artery stenosis 11/10/2020   Chronic kidney disease 11/10/2020   Left basal ganglia embolic stroke ([place]) 123456   PAC (premature atrial contraction)    Pre-procedural cardiovascular examination    Ischemic stroke ([place]) 10/20/2020   TIA (transient ischemic attack) 10/19/2020   Right hemiparesis ([place]) 10/19/2020   Type 2 diabetes mellitus with stage 3 chronic kidney disease, without long-term current use of insulin ([place]) 08/24/2018   Hyperlipidemia, unspecified 07/19/2017   Hypertension 07/19/2017   PUD (peptic ulcer disease) 07/19/2017   Type 2 diabetes mellitus ([place]) 07/19/2017   Personal history of gout 08/12/2016   Anemia, unspecified 04/09/2016   Hypothyroidism, acquired 12/10/2015   Leta Speller, MS, OTR/L  Darleene Cleaver, OT/L 03/11/2021, 5:20 PM  [place] MAIN [facility] SERVICES [place], [place], [address] Phone: [phone]   Fax:  [phone]  Name: Cassandra Schaefer MRN: [id number] Date of Birth: Feb 24, 1929

## 2021-03-16 ENCOUNTER — Encounter: Payer: Medicare HMO | Admitting: Speech Pathology

## 2021-03-16 ENCOUNTER — Other Ambulatory Visit: Payer: Self-pay

## 2021-03-16 ENCOUNTER — Ambulatory Visit: Payer: Medicare HMO

## 2021-03-16 DIAGNOSIS — R262 Difficulty in walking, not elsewhere classified: Secondary | ICD-10-CM

## 2021-03-16 DIAGNOSIS — M6281 Muscle weakness (generalized): Secondary | ICD-10-CM

## 2021-03-16 DIAGNOSIS — R278 Other lack of coordination: Secondary | ICD-10-CM

## 2021-03-16 DIAGNOSIS — R2681 Unsteadiness on feet: Secondary | ICD-10-CM

## 2021-03-16 DIAGNOSIS — R269 Unspecified abnormalities of gait and mobility: Secondary | ICD-10-CM

## 2021-03-16 DIAGNOSIS — R2689 Other abnormalities of gait and mobility: Secondary | ICD-10-CM | POA: Diagnosis not present

## 2021-03-16 NOTE — Therapy (Addendum)
[place] MAIN [facility] SERVICES [address] [place], [place], [address] Phone: [phone]   Fax:  [phone]  Physical Therapy Treatment  Patient Details  Name: Cassandra Schaefer MRN: [id number] Date of Birth: 07-08-1928 Referring Provider (PT): Dr. Dew/Dr. Ginette Pitman PCP   Encounter Date: 03/16/2021   PT End of Session - 03/16/21 1746     Visit Number 25    Number of Visits 38    Date for PT Re-Evaluation 04/27/21    Authorization Type [employer]    Authorization Time Period 02/02/21-04/27/21    Progress Note Due on Visit 20    PT Start Time 1522    PT Stop Time 1600    PT Time Calculation (min) 38 min    Equipment Utilized During Treatment Gait belt    Activity Tolerance Patient tolerated treatment well    Behavior During Therapy WFL for tasks assessed/performed             Past Medical History:  Diagnosis Date   Anemia    Cough    RESOLVING, NO FEVER   Diabetes mellitus without complication ([place])    Gout    Hypertension    Hypothyroidism    PUD (peptic ulcer disease)    Stroke [facility])    Wears dentures    full upper, partial lower    Past Surgical History:  Procedure Laterality Date   AMPUTATION TOE Left 12/14/2017   Procedure: AMPUTATION TOE-4TH MPJ;  Surgeon: Samara Deist, DPM;  Location: [place];  Service: Podiatry;  Laterality: Left;  IVA LOCAL Diabetic - oral meds   APPENDECTOMY     CATARACT EXTRACTION W/PHACO Right 06/23/2015   Procedure: CATARACT EXTRACTION PHACO AND INTRAOCULAR LENS PLACEMENT (IOC);  Surgeon: Estill Cotta, MD;  Location: [facility];  Service: Ophthalmology;  Laterality: Right;  [place] 01:28 AP% 23.4 CDE 36.14 fluid pack lot # [id number]   CATARACT EXTRACTION W/PHACO Left 07/28/2015   Procedure: CATARACT EXTRACTION PHACO AND INTRAOCULAR LENS PLACEMENT (IOC);  Surgeon: Estill Cotta, MD;  Location: [facility];  Service: Ophthalmology;  Laterality: Left;  [place]    1:29.7 AP%  24.9 CDE    41.32 fluid casette lot #[id number] H exp 09/23/2016   COONOSCOPY     AND ENDOSCOPY   DILATION AND CURETTAGE OF UTERUS     ENDARTERECTOMY Left 11/13/2020   Procedure: Evacuation of left neck hematoma;  Surgeon: Katha Cabal, MD;  Location: [facility];  Service: Vascular;  Laterality: Left;   ENDARTERECTOMY Left 11/13/2020   Procedure: ENDARTERECTOMY CAROTID;  Surgeon: Algernon Huxley, MD;  Location: [facility];  Service: Vascular;  Laterality: Left;   TONSILLECTOMY     TUBAL LIGATION      There were no vitals filed for this visit.   Subjective Assessment - 03/16/21 1528     Subjective Pt reports no pain and no new falls. She reports no major changes with anything.    Patient is accompained by: --   Daughter, [name]   Pertinent History 87 y.o. female with medical history significant for type 2 diabetes mellitus, essential hypertension, acquired hypothyroidism, hyperlipidemia, stage III chronic kidney disease reports increased right sided weakness on 10/19/20. She was diagnosed with left basal ganglia ischemic stroke. She was discharged to inpatient rehab for 10 days and then discharged home. Patient was evaluated by outpatient PT on 11/12/20. CVA was due to carotid stenosis. She underwent left carotid endartectomy on 7/21. Procedure went well however patient suffered from left cervical hematoma  and had subsequent surgical procedure to stop the bleed and remove the hematoma on 11/13/20. They were concerned with her breathing and therefore intubated her for 1 day, she was in ICU for 2 days and transferred to med/surge on 11/15/20. Patient was discharged home on 11/17/20 and is now returning to outpatient PT. She has had the stiches removed and is healing well. Denies any neck pain or stiffness. She lives with her daughter and has 24/7 caregiver support. She is still using RW for most ambulation. She is able to transfer to bedside commode well and is able to stand short time unsupported. She reports feeling very  weak and fatigued. She reports she has been dragging her right foot more. She denies any numbness/tingling; She reports feeling like her legs are wanting to do more but is hesitant because of fear of falling. She is mod I for self care morning routine. She does still receive help with showering. She has been working on increasing her activity but denies any formal exercise.    Limitations Standing;Walking    How long can you sit comfortably? NA    How long can you stand comfortably? 30-60 min with support    How long can you walk comfortably? about 100 feet with RW, still limited to short distances;    Diagnostic tests MRI shows left basal ganglia infarct 10/21/20    Patient Stated Goals "Improve balance and walking and not need RW, get back to independent."    Currently in Pain? No/denies    Pain Onset In the past 7 days             Treatment limited: pt arrived 7 minutes late for her appointment due to traffic  Therex:   Nustep interval training: 7 minutes total. Pt maintained SPM: 55-65 Level 0 for 1 minute Level 4 for 30 sec Level 0 for 1 minute Level 4 for 30 sec Level 0 for 1 minute  Level 4 for 30 sec  Level 0 for 1 minute Level 4 for 30 sec Level 0 for 1 minute HR: 83 SPO2: 98% following intervention Distance travelled - 0.23 miles   STS to x 5 with no UE support. Pt perceived exercise as hard. Pt displays B knee valgus - x 2 additional reps with VC for avoiding excessive knee valgus   LAQ with 3 sec isometric at end range, wearing 3# weights on BLEs 2 x 15 reps  Seated heel raise with 3 lb weights x 10 reps. Pt perceived as easy  Standing heel raise with 3 lb weights 2 x 10 reps. VC on minimizing anterior sway.   Standing marches 2 x 15 each side with 3# weights on BLEs. Pt rated difficulty as medium.    Ambulation x approx. 600 ft with CGA and 2WW for muscular endurance and strength. Intermittent VC on heel-toe sequencing and increased dorsiflexion to avoid R foot  drag.   Pt educated throughout session about proper posture and technique with exercises. Improved exercise technique, movement at target joints, use of target muscles after min to mod verbal, visual, tactile cues.     PT Education - 03/16/21 1745     Education Details exercise technique, body mechanics, gait mechanics    Person(s) Educated Patient    Methods Explanation;Demonstration;Verbal cues    Comprehension Verbalized understanding;Returned demonstration;Verbal cues required;Need further instruction              PT Short Term Goals - 03/02/21 1659  PT SHORT TERM GOAL #1   Title Patient will be adherent to HEP at least 3x a week to improve functional strength and balance for better safety at home.    Baseline 9/21: Pt reports compliance with HEP and is confident    Time 4    Period Weeks    Status Achieved    Target Date 12/31/20      PT SHORT TERM GOAL #2   Title Patient will be independent in transferring sit<>Stand without pushing on arm rests to improve ability to get up from chair.    Baseline 9/21: pt able to complete STS without use of UEs    Time 4    Period Weeks    Status Achieved    Target Date 12/31/20               PT Long Term Goals - 03/02/21 1700       PT LONG TERM GOAL #1   Title Patient (> 41 years old) will complete five times sit to stand test in < 15 seconds indicating an increased LE strength and improved balance.    Baseline 9/21: 29 seconds hands-free 10/10: deferred 10/12: 34 sec hands free; 11/2: 23.8 sec hands-free    Time 8    Period Weeks    Status On-going    Target Date 04/27/21      PT LONG TERM GOAL #2   Title Patient will increase six minute walk test distance to >400 feet for improve gait ability    Baseline 9/21: 368 ft with RW; 10/10: deferred 10/12: 408 ft; 11/2: 476 ft with 4WW    Time 8    Period Weeks    Status Achieved    Target Date 04/27/21      PT LONG TERM GOAL #3   Title Patient will increase 10  meter walk test to >1.31ms as to improve gait speed for better community ambulation and to reduce fall risk.    Baseline 9/21: 0.5 m/s with RW; 10/10 deferred 10/12: 0.93 m/s with 4WW; 11/2: 0.83 m/s with 4WW    Time 8    Period Weeks    Status Partially Met    Target Date 04/27/21      PT LONG TERM GOAL #4   Title Patient will be independent with ascend/descend 6 steps using single UE in step over step pattern without LOB.    Baseline 9/21:  Patient uses PT stairs (4 steps) with bilateral upper extremity support on handrails for both ascending/descending.  Patient exhibits reciprocal pattern ascending and step to pattern descending. 10/10: deferred; 10/12: ascending/descending recip. steps with BUE support; 11/2: Ascended/descended 8 steps total, reciprocal pattern ascending and step-to descending. Pt uses UUE support throughout with CGA assist.    Time 8    Period Weeks    Status Partially Met    Target Date 04/27/21      PT LONG TERM GOAL #5   Title Patient will demonstrate an improved Berg Balance Score of >45/56 as to demonstrate improved balance with ADLs such as sitting/standing and transfer balance and reduced fall risk.    Baseline 9/21: deferred d/t time; 9/29: 42/56; 10/10: deferred; 10/12: 44/56; 11/2: deferred; 11/7: 46/56    Time 8    Period Weeks    Status Achieved    Target Date 04/27/21      PT LONG TERM GOAL #6   Title Patient will improve FOTO score to >60% to indicate improved functional mobility with ADLs.  Baseline 9/21: 59; 10/10: 57; 11/2: 60    Time 8    Period Weeks    Status Partially Met    Target Date 04/27/21                   Plan - 03/16/21 1748     Clinical Impression Statement Pt arrived 7 minutes late to her session limiting treatment time due to traffic. She was motivated for all interventions today and showed improvements in duration of HIIT exercise on the nustep. Pt also ambulated a further distance today displaying improving  muscular and cardiovascular endurance. Pt will benefit from further skilled PT to improve strength, gait, endurance, and balance to increase ease and safety with ADLs.    Personal Factors and Comorbidities Age    Comorbidities HTN, CKD, fall risk, type 2 diabetes,    Examination-Activity Limitations Lift;Locomotion Level;Squat;Stairs;Stand;Toileting;Transfers;Bathing    Examination-Participation Restrictions Cleaning;Community Activity;Laundry;Meal Prep;Shop;Volunteer;Driving    Stability/Clinical Decision Making Stable/Uncomplicated    Rehab Potential Poor    PT Frequency 2x / week    PT Duration 12 weeks    PT Treatment/Interventions Cryotherapy;Electrical Stimulation;Moist Heat;Gait training;Stair training;Functional mobility training;Therapeutic activities;Therapeutic exercise;Neuromuscular re-education;Balance training;Patient/family education;Orthotic Fit/Training;Energy conservation    PT Next Visit Plan work on LE strengthening and balance; gait training, endurance    PT Home Exercise Plan Access Code: [email or web address]; no updates    Consulted and Agree with Plan of Care Patient             Patient will benefit from skilled therapeutic intervention in order to improve the following deficits and impairments:  Abnormal gait, Decreased balance, Decreased endurance, Decreased mobility, Difficulty walking, Cardiopulmonary status limiting activity, Decreased activity tolerance, Decreased coordination, Decreased strength  Visit Diagnosis: Muscle weakness (generalized)  Unsteadiness on feet  Difficulty in walking, not elsewhere classified  Abnormality of gait and mobility     Problem List Patient Active Problem List   Diagnosis Date Noted   Carotid stenosis, symptomatic, with infarction ([place]) 11/13/2020   Gout flare 11/10/2020   UTI (urinary tract infection) 11/10/2020   Left carotid artery stenosis 11/10/2020   Chronic kidney disease 11/10/2020   Left basal ganglia embolic stroke  ([place]) 91/66/0600   PAC (premature atrial contraction)    Pre-procedural cardiovascular examination    Ischemic stroke ([place]) 10/20/2020   TIA (transient ischemic attack) 10/19/2020   Right hemiparesis ([place]) 10/19/2020   Type 2 diabetes mellitus with stage 3 chronic kidney disease, without long-term current use of insulin ([place]) 08/24/2018   Hyperlipidemia, unspecified 07/19/2017   Hypertension 07/19/2017   PUD (peptic ulcer disease) 07/19/2017   Type 2 diabetes mellitus ([place]) 07/19/2017   Personal history of gout 08/12/2016   Anemia, unspecified 04/09/2016   Hypothyroidism, acquired 12/10/2015  Ricard Dillon PT, DPT This entire session was performed under direct supervision and direction of a licensed therapist/therapist assistant . I have personally read, edited and approve of the note as written.   Karn Cassis, SPT 03/16/2021, 5:51 PM  [place] MAIN [facility] SERVICES [address] [place], [place], [address] Phone: [phone]   Fax:  [phone]  Name: Cassandra Schaefer MRN: [id number] Date of Birth: Jul 26, 1928

## 2021-03-17 NOTE — Therapy (Signed)
[place] MAIN [facility] SERVICES [address] [place], [place], [address] Phone: [phone]   Fax:  [phone]  Occupational Therapy Treatment  Patient Details  Name: Cassandra Schaefer MRN: [id number] Date of Birth: Aug 18, 1928 Referring Provider (OT): Dr. Tracie Harrier   Encounter Date: 03/16/2021   OT End of Session - 03/17/21 1003     Visit Number 28    Number of Visits 40    Date for OT Re-Evaluation 04/21/21    Authorization Time Period Reporting period starting 02/16/2021    OT Start Time 1600    OT Stop Time 1645    OT Time Calculation (min) 45 min    Equipment Utilized During Treatment tranport chair    Activity Tolerance Patient tolerated treatment well    Behavior During Therapy WFL for tasks assessed/performed             Past Medical History:  Diagnosis Date   Anemia    Cough    RESOLVING, NO FEVER   Diabetes mellitus without complication ([place])    Gout    Hypertension    Hypothyroidism    PUD (peptic ulcer disease)    Stroke [facility])    Wears dentures    full upper, partial lower    Past Surgical History:  Procedure Laterality Date   AMPUTATION TOE Left 12/14/2017   Procedure: AMPUTATION TOE-4TH MPJ;  Surgeon: Samara Deist, DPM;  Location: [place];  Service: Podiatry;  Laterality: Left;  IVA LOCAL Diabetic - oral meds   APPENDECTOMY     CATARACT EXTRACTION W/PHACO Right 06/23/2015   Procedure: CATARACT EXTRACTION PHACO AND INTRAOCULAR LENS PLACEMENT (IOC);  Surgeon: Estill Cotta, MD;  Location: [facility];  Service: Ophthalmology;  Laterality: Right;  [place] 01:28 AP% 23.4 CDE 36.14 fluid pack lot # [id number]   CATARACT EXTRACTION W/PHACO Left 07/28/2015   Procedure: CATARACT EXTRACTION PHACO AND INTRAOCULAR LENS PLACEMENT (IOC);  Surgeon: Estill Cotta, MD;  Location: [facility];  Service: Ophthalmology;  Laterality: Left;  [place]    1:29.7 AP%  24.9 CDE   41.32 fluid casette lot [id number] H exp  09/23/2016   COONOSCOPY     AND ENDOSCOPY   DILATION AND CURETTAGE OF UTERUS     ENDARTERECTOMY Left 11/13/2020   Procedure: Evacuation of left neck hematoma;  Surgeon: Katha Cabal, MD;  Location: [facility];  Service: Vascular;  Laterality: Left;   ENDARTERECTOMY Left 11/13/2020   Procedure: ENDARTERECTOMY CAROTID;  Surgeon: Algernon Huxley, MD;  Location: [facility];  Service: Vascular;  Laterality: Left;   TONSILLECTOMY     TUBAL LIGATION      There were no vitals filed for this visit.   Subjective Assessment - 03/17/21 1003     Subjective  Pt reports she's looking forward to playing dominoes tomorrow to see the improvement in her hand.    Patient is accompanied by: Family member    Pertinent History R ankle pain, gout, T2DM, HTN, CKDIII; R/L carotid endarectomy    Patient Stated Goals "I want to improve my aim when I'm using my R arm."    Currently in Pain? No/denies    Pain Score 0-No pain    Pain Onset In the past 7 days            Occupational Therapy Treatment: Therapeutic Exercise: Pt participated in functional hand skills with use of [place] game, working to gather small dish of stones, store in hand, and discard 1 at a time; extra  time to gather 5+ stones, frequent dropping when discarding 1 at a time when storing 5+ stones in hand. Facilitated hand strengthening with use of hand gripper with min resistance (red band), completing 5 sets 10 reps for each hand, alternating sets throughout duration of session to avoid strain/fatigue.  Practiced [facility] with focus on speed flipping, dealing, gathering, and shuffling deck of cards.   Response to Treatment: Pt requires extra time when using R hand to pick up small objects from table top.  Pt continues to develop accuracy, efficiency, and control with FM tasks when using R hand.  Pt will continue to benefit from skilled OT to address functional hand skills for maximizing indep with ADL/IADL/leisure activities.       OT Education -  03/16/21 1003     Education Details fine motor coordination exercises, functional hand skills    Person(s) Educated Patient    Methods Explanation;Verbal cues;Demonstration    Comprehension Verbalized understanding;Returned demonstration;Verbal cues required              OT Short Term Goals - 01/28/21 1644       OT SHORT TERM GOAL #1   Title Pt will perform HEP for RUE strength/coordination independently.    Baseline Eval: HEP not yet established in outpatient, but pt is working with putty from [name]; 12/04/2020; [facility] HEP initiated this day with mod vc after return demo; 01/05/2021: indep with currently program, but ongoing as pt progresses; 01/28/2021: vc to revisit and focus on grip and pinch strengthening with putty    Time 6    Period Weeks    Status On-going    Target Date 03/10/21               OT Long Term Goals - 02/11/21 1635       OT LONG TERM GOAL #1   Title Pt will improve hand writing to 100% legibility with R hand to be able to independently write a check.    Baseline Eval: signature is 75% legible with R hand, requiring extra time; 12/04/2020: no change from eval; 01/05/2021: Printing is 100% legible, signature is 90% legible, but still requires extra time and effort.; 01/26/21: Signature is 90% legible and speed/fluidity have improved. 20th visit: 90% legible.    Time 12    Period Weeks    Status On-going    Target Date 04/21/21      OT LONG TERM GOAL #2   Title Pt will improve R hand coordination to enable good manipulation of iphone using R dominant hand    Baseline Eval: Pt uses L hand d/t R hand lacking sufficient [place] to press buttons on phone; 12/04/2020: no change from eval; 01/05/2021: R hand coordination is improving but pt still uses L hand to dial phone; 01/28/21: Pt using R dominant hand to press buttons and apps on phone 50% of the time.20th visit: Right hand [facility] skilols are improving, Pt. is improving with manipulation of the iphone.    Time 12    Period  Weeks    Status On-going    Target Date 04/21/21      OT LONG TERM GOAL #3   Title Pt will improve GMC throughout RUE to enable pt to reach for and pick up ADL supplies with R dominant hand without dropping or knocking over objects.    Baseline Eval: Pt reports RUE is clumsy and easily knocks over objects when trying to reach for ADL supplies.  Pt verbalizes that her "aim is  off."; 12/04/2020: pt reports slight improvement since eval and has started using her R hand to eat, but still feels clumsy; 01/05/2021: Greatly improved; pt is using silverware to eat with R hand, can comb hair with RUE, still mildly ataxic; 01/26/21: pt reports difficulty picking up objects within a small space (ie; may knock the toothpaste holder down when reaching for toothbrush), but reaching for light/larger objects is improving 20th visit: pt. continues to be mildly ataxic, however is consistently improving with motor control, and FMC while reaching targets.    Time 12    Period Weeks    Status On-going    Target Date 04/21/21      OT LONG TERM GOAL #4   Title Pt will improve FOTO score to 68 or better to indicate measurable functional improvement.    Baseline Eval: FOTO 55; 01/05/2021: FOTO 61; 01/28/21: FOTO 61, 20th visit: 61    Time 12    Period Weeks    Status On-going    Target Date 04/21/21                   Plan - 03/16/21 1022     Clinical Impression Statement Pt requires extra time when using R hand to pick up small objects from table top.  Pt continues to develop accuracy, efficiency, and control with FM tasks when using R hand.  Pt will continue to benefit from skilled OT to address functional hand skills for maximizing indep with ADL/IADL/leisure activities.    OT Occupational Profile and History Detailed Assessment- Review of Records and additional review of physical, cognitive, psychosocial history related to current functional performance    Occupational performance deficits (Please refer to  evaluation for details): ADL's;Leisure;IADL's    Body Structure / Function / Physical Skills ADL;Coordination;Endurance;GMC;UE functional use;Balance;Sensation;Body mechanics;IADL;Pain;Dexterity;FMC;Strength;Gait;Mobility    Rehab Potential Good    Clinical Decision Making Several treatment options, min-mod task modification necessary    Comorbidities Affecting Occupational Performance: May have comorbidities impacting occupational performance    Modification or Assistance to Complete Evaluation  Min-Moderate modification of tasks or assist with assess necessary to complete eval    OT Frequency 2x / week    OT Duration 12 weeks    OT Treatment/Interventions Self-care/ADL training;Therapeutic exercise;DME and/or AE instruction;Functional Mobility Training;Balance training;Neuromuscular education;Therapeutic activities;Patient/family education    Consulted and Agree with Plan of Care Patient             Patient will benefit from skilled therapeutic intervention in order to improve the following deficits and impairments:   Body Structure / Function / Physical Skills: ADL, Coordination, Endurance, GMC, UE functional use, Balance, Sensation, Body mechanics, IADL, Pain, Dexterity, FMC, Strength, Gait, Mobility       Visit Diagnosis: Muscle weakness (generalized)  Other lack of coordination    Problem List Patient Active Problem List   Diagnosis Date Noted   Carotid stenosis, symptomatic, with infarction (HCC) 11/13/2020   Gout flare 11/10/2020   UTI (urinary tract infection) 11/10/2020   Left carotid artery stenosis 11/10/2020   Chronic kidney disease 11/10/2020   Left basal ganglia embolic stroke (HCC) 10/24/2020   PAC (premature atrial contraction)    Pre-procedural cardiovascular examination    Ischemic stroke (HCC) 10/20/2020   TIA (transient ischemic attack) 10/19/2020   Right hemiparesis (HCC) 10/19/2020   Type 2 diabetes mellitus with stage 3 chronic kidney disease,  without long-term current use of insulin (HCC) 08/24/2018   Hyperlipidemia, unspecified 07/19/2017   Hypertension 07/19/2017   PUD (  peptic ulcer disease) 07/19/2017   Type 2 diabetes mellitus ([place]) 07/19/2017   Personal history of gout 08/12/2016   Anemia, unspecified 04/09/2016   Hypothyroidism, acquired 12/10/2015   Leta Speller, MS, OTR/L  Darleene Cleaver, OT/L 03/17/2021, 10:22 AM  [place] MAIN [facility] SERVICES [address] [place], [place], [address] Phone: [phone]   Fax:  [phone]  Name: Cassandra Schaefer MRN: [id number] Date of Birth: 1929/03/01

## 2021-03-18 ENCOUNTER — Other Ambulatory Visit: Payer: Self-pay

## 2021-03-18 ENCOUNTER — Ambulatory Visit: Payer: Medicare HMO

## 2021-03-18 ENCOUNTER — Ambulatory Visit: Payer: Medicare HMO | Admitting: Occupational Therapy

## 2021-03-18 ENCOUNTER — Encounter: Payer: Medicare HMO | Admitting: Speech Pathology

## 2021-03-18 ENCOUNTER — Encounter: Payer: Self-pay | Admitting: Occupational Therapy

## 2021-03-18 DIAGNOSIS — R278 Other lack of coordination: Secondary | ICD-10-CM

## 2021-03-18 DIAGNOSIS — R2689 Other abnormalities of gait and mobility: Secondary | ICD-10-CM

## 2021-03-18 DIAGNOSIS — R2681 Unsteadiness on feet: Secondary | ICD-10-CM

## 2021-03-18 DIAGNOSIS — M6281 Muscle weakness (generalized): Secondary | ICD-10-CM

## 2021-03-18 NOTE — Therapy (Signed)
[place] MAIN [facility] SERVICES [address] [place], [place], [address] Phone: [phone]   Fax:  [phone]  Occupational Therapy Treatment  Patient Details  Name: Cassandra Schaefer MRN: [id number] Date of Birth: [birth year] Referring Provider (OT): Dr. Tracie Harrier   Encounter Date: 03/18/2021   OT End of Session - 03/18/21 1346     Visit Number 29    Number of Visits 40    Date for OT Re-Evaluation 04/21/21    Authorization Time Period Reporting period starting 02/16/2021    OT Start Time 1345    OT Stop Time 1430    OT Time Calculation (min) 45 min    Activity Tolerance Patient tolerated treatment well    Behavior During Therapy WFL for tasks assessed/performed             Past Medical History:  Diagnosis Date   Anemia    Cough    RESOLVING, NO FEVER   Diabetes mellitus without complication ([place])    Gout    Hypertension    Hypothyroidism    PUD (peptic ulcer disease)    Stroke [facility])    Wears dentures    full upper, partial lower    Past Surgical History:  Procedure Laterality Date   AMPUTATION TOE Left 12/14/2017   Procedure: AMPUTATION TOE-4TH MPJ;  Surgeon: Samara Deist, DPM;  Location: [place];  Service: Podiatry;  Laterality: Left;  IVA LOCAL Diabetic - oral meds   APPENDECTOMY     CATARACT EXTRACTION W/PHACO Right 06/23/2015   Procedure: CATARACT EXTRACTION PHACO AND INTRAOCULAR LENS PLACEMENT (IOC);  Surgeon: Estill Cotta, MD;  Location: [facility];  Service: Ophthalmology;  Laterality: Right;  [place] 01:28 AP% 23.4 CDE 36.14 fluid pack lot # [id number]   CATARACT EXTRACTION W/PHACO Left 07/28/2015   Procedure: CATARACT EXTRACTION PHACO AND INTRAOCULAR LENS PLACEMENT (IOC);  Surgeon: Estill Cotta, MD;  Location: [facility];  Service: Ophthalmology;  Laterality: Left;  [place]    1:29.7 AP%  24.9 CDE   41.32 fluid casette lot [id number] H exp 09/23/2016   COONOSCOPY     AND ENDOSCOPY   DILATION AND  CURETTAGE OF UTERUS     ENDARTERECTOMY Left 11/13/2020   Procedure: Evacuation of left neck hematoma;  Surgeon: Katha Cabal, MD;  Location: [facility];  Service: Vascular;  Laterality: Left;   ENDARTERECTOMY Left 11/13/2020   Procedure: ENDARTERECTOMY CAROTID;  Surgeon: Algernon Huxley, MD;  Location: [facility];  Service: Vascular;  Laterality: Left;   TONSILLECTOMY     TUBAL LIGATION      There were no vitals filed for this visit.   Subjective Assessment - 03/18/21 1345     Subjective  Pt reports that she worked her hand alot using her right hand to crumble bread.    Patient is accompanied by: Family member    Pertinent History R ankle pain, gout, T2DM, HTN, CKDIII; R/L carotid endarectomy    Currently in Pain? No/denies            OT TREATMENT    Neuro muscular re-education:  Pt. worked on right hand [facility] skills grasping 1/2" pegs, and storing them in the palm of her hand. Pt. worked on Clinical cytogeneticist moving the pegs from her palm to the tip of her 2nd digit, and thumb in preparation for placing them onto a pegboard placed at a vertical angle.   Pt. continues to make progress with her RUE, and hand function. Pt. reports that she  really used, and worked her right hand to crumble bread when making stuffing. Pt. continues to present with limited right hand strength, and [facility] skills. Pt. continues to work on improving translatory movements with the right hand. At times, the peg would stick to the proximal aspect of her palm, which resulted in pt. having difficulty reaching with her thumb.  Pt. was able to move the peg from this position by shifting her hand, and resume translatory movements with her thumb. Pt. Presented with fatigue in her hand during this task. Pt. worked with the pegboard placed at multiple vertical angles. Pt. continues to work on improving RUE hand function in order to maximize independence with ADLs, and IADL tasks.                         OT  Education - 03/18/21 1346     Education Details fine motor coordination exercises, functional hand skills    Person(s) Educated Patient    Methods Explanation;Verbal cues;Demonstration    Comprehension Verbalized understanding;Returned demonstration;Verbal cues required              OT Short Term Goals - 01/28/21 1644       OT SHORT TERM GOAL #1   Title Pt will perform HEP for RUE strength/coordination independently.    Baseline Eval: HEP not yet established in outpatient, but pt is working with putty from [name]; 12/04/2020; [facility] HEP initiated this day with mod vc after return demo; 01/05/2021: indep with currently program, but ongoing as pt progresses; 01/28/2021: vc to revisit and focus on grip and pinch strengthening with putty    Time 6    Period Weeks    Status On-going    Target Date 03/10/21               OT Long Term Goals - 02/11/21 1635       OT LONG TERM GOAL #1   Title Pt will improve hand writing to 100% legibility with R hand to be able to independently write a check.    Baseline Eval: signature is 75% legible with R hand, requiring extra time; 12/04/2020: no change from eval; 01/05/2021: Printing is 100% legible, signature is 90% legible, but still requires extra time and effort.; 01/26/21: Signature is 90% legible and speed/fluidity have improved. 20th visit: 90% legible.    Time 12    Period Weeks    Status On-going    Target Date 04/21/21      OT LONG TERM GOAL #2   Title Pt will improve R hand coordination to enable good manipulation of iphone using R dominant hand    Baseline Eval: Pt uses L hand d/t R hand lacking sufficient [place] to press buttons on phone; 12/04/2020: no change from eval; 01/05/2021: R hand coordination is improving but pt still uses L hand to dial phone; 01/28/21: Pt using R dominant hand to press buttons and apps on phone 50% of the time.20th visit: Right hand [facility] skilols are improving, Pt. is improving with manipulation of the iphone.    Time  12    Period Weeks    Status On-going    Target Date 04/21/21      OT LONG TERM GOAL #3   Title Pt will improve GMC throughout RUE to enable pt to reach for and pick up ADL supplies with R dominant hand without dropping or knocking over objects.    Baseline Eval: Pt reports RUE is clumsy and  easily knocks over objects when trying to reach for ADL supplies.  Pt verbalizes that her "aim is off."; 12/04/2020: pt reports slight improvement since eval and has started using her R hand to eat, but still feels clumsy; 01/05/2021: Greatly improved; pt is using silverware to eat with R hand, can comb hair with RUE, still mildly ataxic; 01/26/21: pt reports difficulty picking up objects within a small space (ie; may knock the toothpaste holder down when reaching for toothbrush), but reaching for light/larger objects is improving 20th visit: pt. continues to be mildly ataxic, however is consistently improving with motor control, and [place] while reaching targets.    Time 12    Period Weeks    Status On-going    Target Date 04/21/21      OT LONG TERM GOAL #4   Title Pt will improve FOTO score to 68 or better to indicate measurable functional improvement.    Baseline Eval: FOTO 55; 01/05/2021: FOTO 61; 01/28/21: FOTO 61, 20th visit: 61    Time 12    Period Weeks    Status On-going    Target Date 04/21/21                   Plan - 03/18/21 1351     Clinical Impression Statement Pt. continues to make progress with her RUE, and hand function. Pt. reports that she really used, and worked her right hand to crumble bread when making stuffing. Pt. continues to present with limited right hand strength, and [facility] skills. Pt. continues to work on improving translatory movements with the right hand. At times, the peg would stick to the proximal aspect of her palm, which resulted in pt. having difficulty reaching with her thumb.  Pt. was able to move the peg from this position by shifting her hand, and resume  translatory movements with her thumb. Pt. Presented with fatigue in her hand during this task. Pt. worked with the pegboard placed at multiple vertical angles. Pt. continues to work on improving RUE hand function in order to maximize independence with ADLs, and IADL tasks.    OT Occupational Profile and History Detailed Assessment- Review of Records and additional review of physical, cognitive, psychosocial history related to current functional performance    Occupational performance deficits (Please refer to evaluation for details): ADL's;Leisure;IADL's    Body Structure / Function / Physical Skills ADL;Coordination;Endurance;GMC;UE functional use;Balance;Sensation;Body mechanics;IADL;Pain;Dexterity;FMC;Strength;Gait;Mobility    Rehab Potential Good    Clinical Decision Making Several treatment options, min-mod task modification necessary    Comorbidities Affecting Occupational Performance: May have comorbidities impacting occupational performance    Modification or Assistance to Complete Evaluation  Min-Moderate modification of tasks or assist with assess necessary to complete eval    OT Frequency 2x / week    OT Duration 12 weeks    OT Treatment/Interventions Self-care/ADL training;Therapeutic exercise;DME and/or AE instruction;Functional Mobility Training;Balance training;Neuromuscular education;Therapeutic activities;Patient/family education    Consulted and Agree with Plan of Care Patient             Patient will benefit from skilled therapeutic intervention in order to improve the following deficits and impairments:   Body Structure / Function / Physical Skills: ADL, Coordination, Endurance, GMC, UE functional use, Balance, Sensation, Body mechanics, IADL, Pain, Dexterity, FMC, Strength, Gait, Mobility       Visit Diagnosis: Muscle weakness (generalized)  Other lack of coordination    Problem List Patient Active Problem List   Diagnosis Date Noted   Carotid stenosis,  symptomatic,  with infarction (HCC) 11/13/2020   Gout flare 11/10/2020   UTI (urinary tract infection) 11/10/2020   Left carotid artery stenosis 11/10/2020   Chronic kidney disease 11/10/2020   Left basal ganglia embolic stroke (HCC) 10/24/2020   PAC (premature atrial contraction)    Pre-procedural cardiovascular examination    Ischemic stroke (HCC) 10/20/2020   TIA (transient ischemic attack) 10/19/2020   Right hemiparesis (HCC) 10/19/2020   Type 2 diabetes mellitus with stage 3 chronic kidney disease, without long-term current use of insulin (HCC) 08/24/2018   Hyperlipidemia, unspecified 07/19/2017   Hypertension 07/19/2017   PUD (peptic ulcer disease) 07/19/2017   Type 2 diabetes mellitus (HCC) 07/19/2017   Personal history of gout 08/12/2016   Anemia, unspecified 04/09/2016   Hypothyroidism, acquired 12/10/2015    Olegario Messier, MS, OTR/L 03/18/2021, 1:53 PM  [facility] MAIN [facility] SERVICES [address] [place], [place], [address] Phone: [phone]   Fax:  [phone]  Name: Cassandra Schaefer MRN: [id number] Date of Birth: 1929/04/05

## 2021-03-18 NOTE — Therapy (Signed)
[place] MAIN [facility] SERVICES [address] [place], [place], [address] Phone: [phone]   Fax:  [phone]  Physical Therapy Treatment  Patient Details  Name: Cassandra Schaefer MRN: [id number] Date of Birth: 08-Jun-1928 Referring Provider (PT): Dr. Dew/Dr. Ginette Pitman PCP   Encounter Date: 03/18/2021   PT End of Session - 03/18/21 1308     Visit Number 26    Number of Visits 38    Date for PT Re-Evaluation 04/27/21    Authorization Type [employer]    Authorization Time Period 02/02/21-04/27/21    Progress Note Due on Visit 20    PT Start Time 1304    PT Stop Time 1343    PT Time Calculation (min) 39 min    Equipment Utilized During Treatment Gait belt    Activity Tolerance Patient tolerated treatment well    Behavior During Therapy WFL for tasks assessed/performed             Past Medical History:  Diagnosis Date   Anemia    Cough    RESOLVING, NO FEVER   Diabetes mellitus without complication ([place])    Gout    Hypertension    Hypothyroidism    PUD (peptic ulcer disease)    Stroke [facility])    Wears dentures    full upper, partial lower    Past Surgical History:  Procedure Laterality Date   AMPUTATION TOE Left 12/14/2017   Procedure: AMPUTATION TOE-4TH MPJ;  Surgeon: Samara Deist, DPM;  Location: [facility];  Service: Podiatry;  Laterality: Left;  IVA LOCAL Diabetic - oral meds   APPENDECTOMY     CATARACT EXTRACTION W/PHACO Right 06/23/2015   Procedure: CATARACT EXTRACTION PHACO AND INTRAOCULAR LENS PLACEMENT (IOC);  Surgeon: Estill Cotta, MD;  Location: [facility];  Service: Ophthalmology;  Laterality: Right;  [place] 01:28 AP% 23.4 CDE 36.14 fluid pack lot # [id number]   CATARACT EXTRACTION W/PHACO Left 07/28/2015   Procedure: CATARACT EXTRACTION PHACO AND INTRAOCULAR LENS PLACEMENT (IOC);  Surgeon: Estill Cotta, MD;  Location: [facility];  Service: Ophthalmology;  Laterality: Left;  [place]    1:29.7 AP%  24.9 CDE    41.32 fluid casette lot #[id number] H exp 09/23/2016   COONOSCOPY     AND ENDOSCOPY   DILATION AND CURETTAGE OF UTERUS     ENDARTERECTOMY Left 11/13/2020   Procedure: Evacuation of left neck hematoma;  Surgeon: Katha Cabal, MD;  Location: [facility];  Service: Vascular;  Laterality: Left;   ENDARTERECTOMY Left 11/13/2020   Procedure: ENDARTERECTOMY CAROTID;  Surgeon: Algernon Huxley, MD;  Location: [facility];  Service: Vascular;  Laterality: Left;   TONSILLECTOMY     TUBAL LIGATION      There were no vitals filed for this visit.   Subjective Assessment - 03/18/21 1301     Subjective Pt reports no aches or pains. She denies any falls or LOB.    Patient is accompained by: --   Daughter, [name]   Pertinent History 87 y.o. female with medical history significant for type 2 diabetes mellitus, essential hypertension, acquired hypothyroidism, hyperlipidemia, stage III chronic kidney disease reports increased right sided weakness on 10/19/20. She was diagnosed with left basal ganglia ischemic stroke. She was discharged to inpatient rehab for 10 days and then discharged home. Patient was evaluated by outpatient PT on 11/12/20. CVA was due to carotid stenosis. She underwent left carotid endartectomy on 7/21. Procedure went well however patient suffered from left cervical hematoma and had subsequent  surgical procedure to stop the bleed and remove the hematoma on 11/13/20. They were concerned with her breathing and therefore intubated her for 1 day, she was in ICU for 2 days and transferred to med/surge on 11/15/20. Patient was discharged home on 11/17/20 and is now returning to outpatient PT. She has had the stiches removed and is healing well. Denies any neck pain or stiffness. She lives with her daughter and has 24/7 caregiver support. She is still using RW for most ambulation. She is able to transfer to bedside commode well and is able to stand short time unsupported. She reports feeling very weak and fatigued. She  reports she has been dragging her right foot more. She denies any numbness/tingling; She reports feeling like her legs are wanting to do more but is hesitant because of fear of falling. She is mod I for self care morning routine. She does still receive help with showering. She has been working on increasing her activity but denies any formal exercise.    Limitations Standing;Walking    How long can you sit comfortably? NA    How long can you stand comfortably? 30-60 min with support    How long can you walk comfortably? about 100 feet with RW, still limited to short distances;    Diagnostic tests MRI shows left basal ganglia infarct 10/21/20    Patient Stated Goals "Improve balance and walking and not need RW, get back to independent."    Currently in Pain? No/denies    Pain Onset In the past 7 days           INTERVENTIONS   Nustep HIIT. Pt maintained SPM: 55-76 Level 0 for 1 minute 30 sec Level 4 for 30 sec Level 0 for 1 minute Level 4 for 30 sec Level 0 for 1 minute  Level 4 for 30 sec  Level 0 for 1 minute Level 4 for 30 sec Level 0 for 1 minute Distance travelled - 0.26 miles Close CGA for mount/dismount. Pt monitored for exercise response throughout.   STS  2x7 with no UE support. Pt continues to perceive exercise as hard. Pt improved perception of difficulty with correcting technique "nose over toes"   LAQ with 1 sec isometric at end range, wearing 3# weights on BLEs 3 x 15 reps   Standing heel raise with 3 lb weights 2 x 15 reps. Continued VC on minimizing anterior sway.  Standing marches 2 x 20 each side with 3# weights on BLEs. Pt rated difficulty as medium. Pt cued for eccentric control.   Ambulation approx. 2x148 ft with CGA and 2WW for muscular endurance and strength, pt wearing 3lb ankle weights for first two reps. Ankle weigths removed and pt completes another 2x148-56 ft. Continued cues for sequencing/improved LE clearance/DF.   Pt educated throughout session  about proper posture and technique with exercises. Improved exercise technique, movement at target joints, use of target muscles after min to mod verbal, visual, tactile cues.    PT Education - 03/18/21 1307     Education Details exercise technique, body mechanics    Person(s) Educated Patient    Methods Explanation;Demonstration;Verbal cues    Comprehension Verbalized understanding;Returned demonstration;Need further instruction              PT Short Term Goals - 03/02/21 1659       PT SHORT TERM GOAL #1   Title Patient will be adherent to HEP at least 3x a week to improve functional strength and balance for  better safety at home.    Baseline 9/21: Pt reports compliance with HEP and is confident    Time 4    Period Weeks    Status Achieved    Target Date 12/31/20      PT SHORT TERM GOAL #2   Title Patient will be independent in transferring sit<>Stand without pushing on arm rests to improve ability to get up from chair.    Baseline 9/21: pt able to complete STS without use of UEs    Time 4    Period Weeks    Status Achieved    Target Date 12/31/20               PT Long Term Goals - 03/02/21 1700       PT LONG TERM GOAL #1   Title Patient (> 31 years old) will complete five times sit to stand test in < 15 seconds indicating an increased LE strength and improved balance.    Baseline 9/21: 29 seconds hands-free 10/10: deferred 10/12: 34 sec hands free; 11/2: 23.8 sec hands-free    Time 8    Period Weeks    Status On-going    Target Date 04/27/21      PT LONG TERM GOAL #2   Title Patient will increase six minute walk test distance to >400 feet for improve gait ability    Baseline 9/21: 368 ft with RW; 10/10: deferred 10/12: 408 ft; 11/2: 476 ft with 4WW    Time 8    Period Weeks    Status Achieved    Target Date 04/27/21      PT LONG TERM GOAL #3   Title Patient will increase 10 meter walk test to >1.53ms as to improve gait speed for better community  ambulation and to reduce fall risk.    Baseline 9/21: 0.5 m/s with RW; 10/10 deferred 10/12: 0.93 m/s with 4WW; 11/2: 0.83 m/s with 4WW    Time 8    Period Weeks    Status Partially Met    Target Date 04/27/21      PT LONG TERM GOAL #4   Title Patient will be independent with ascend/descend 6 steps using single UE in step over step pattern without LOB.    Baseline 9/21:  Patient uses PT stairs (4 steps) with bilateral upper extremity support on handrails for both ascending/descending.  Patient exhibits reciprocal pattern ascending and step to pattern descending. 10/10: deferred; 10/12: ascending/descending recip. steps with BUE support; 11/2: Ascended/descended 8 steps total, reciprocal pattern ascending and step-to descending. Pt uses UUE support throughout with CGA assist.    Time 8    Period Weeks    Status Partially Met    Target Date 04/27/21      PT LONG TERM GOAL #5   Title Patient will demonstrate an improved Berg Balance Score of >45/56 as to demonstrate improved balance with ADLs such as sitting/standing and transfer balance and reduced fall risk.    Baseline 9/21: deferred d/t time; 9/29: 42/56; 10/10: deferred; 10/12: 44/56; 11/2: deferred; 11/7: 46/56    Time 8    Period Weeks    Status Achieved    Target Date 04/27/21      PT LONG TERM GOAL #6   Title Patient will improve FOTO score to >60% to indicate improved functional mobility with ADLs.    Baseline 9/21: 59; 10/10: 57; 11/2: 60    Time 8    Period Weeks    Status Partially Met  Target Date 04/27/21                   Plan - 03/18/21 1819     Clinical Impression Statement Pt able to progress on volume and duration of interventions, indicating functional carryover between sessions. While pt shows progress, she continues to require frequent cuing for correct STS technique. Once pt performing with correct technique she demonstrates and reports improved ease with STS. The pt will benefit from further skilled  PT to improve strength, gait, balance, endurance and mobility to increase ease and safety with ADLs.    Personal Factors and Comorbidities Age    Comorbidities HTN, CKD, fall risk, type 2 diabetes,    Examination-Activity Limitations Lift;Locomotion Level;Squat;Stairs;Stand;Toileting;Transfers;Bathing    Examination-Participation Restrictions Cleaning;Community Activity;Laundry;Meal Prep;Shop;Volunteer;Driving    Stability/Clinical Decision Making Stable/Uncomplicated    Rehab Potential Poor    PT Frequency 2x / week    PT Duration 12 weeks    PT Treatment/Interventions Cryotherapy;Electrical Stimulation;Moist Heat;Gait training;Stair training;Functional mobility training;Therapeutic activities;Therapeutic exercise;Neuromuscular re-education;Balance training;Patient/family education;Orthotic Fit/Training;Energy conservation    PT Next Visit Plan work on LE strengthening and balance; gait training, endurance    PT Home Exercise Plan Access Code: [email or web address]; no updates    Consulted and Agree with Plan of Care Patient             Patient will benefit from skilled therapeutic intervention in order to improve the following deficits and impairments:  Abnormal gait, Decreased balance, Decreased endurance, Decreased mobility, Difficulty walking, Cardiopulmonary status limiting activity, Decreased activity tolerance, Decreased coordination, Decreased strength  Visit Diagnosis: Muscle weakness (generalized)  Other abnormalities of gait and mobility  Other lack of coordination  Unsteadiness on feet     Problem List Patient Active Problem List   Diagnosis Date Noted   Carotid stenosis, symptomatic, with infarction ([place]) 11/13/2020   Gout flare 11/10/2020   UTI (urinary tract infection) 11/10/2020   Left carotid artery stenosis 11/10/2020   Chronic kidney disease 11/10/2020   Left basal ganglia embolic stroke ([place]) 29/47/6546   PAC (premature atrial contraction)    Pre-procedural  cardiovascular examination    Ischemic stroke ([place]) 10/20/2020   TIA (transient ischemic attack) 10/19/2020   Right hemiparesis ([place]) 10/19/2020   Type 2 diabetes mellitus with stage 3 chronic kidney disease, without long-term current use of insulin ([place]) 08/24/2018   Hyperlipidemia, unspecified 07/19/2017   Hypertension 07/19/2017   PUD (peptic ulcer disease) 07/19/2017   Type 2 diabetes mellitus ([place]) 07/19/2017   Personal history of gout 08/12/2016   Anemia, unspecified 04/09/2016   Hypothyroidism, acquired 12/10/2015    Zollie Pee, PT 03/18/2021, 6:23 PM  [place] MAIN [facility] SERVICES [address] [place], [place], [address] Phone: [phone]   Fax:  [phone]  Name: Cassandra Schaefer MRN: [id number] Date of Birth: [birth year]

## 2021-03-20 ENCOUNTER — Other Ambulatory Visit (INDEPENDENT_AMBULATORY_CARE_PROVIDER_SITE_OTHER): Payer: Self-pay | Admitting: Vascular Surgery

## 2021-03-23 ENCOUNTER — Ambulatory Visit: Payer: Medicare HMO

## 2021-03-23 ENCOUNTER — Encounter: Payer: Medicare HMO | Admitting: Speech Pathology

## 2021-03-23 ENCOUNTER — Other Ambulatory Visit: Payer: Self-pay

## 2021-03-23 DIAGNOSIS — M6281 Muscle weakness (generalized): Secondary | ICD-10-CM

## 2021-03-23 DIAGNOSIS — R2689 Other abnormalities of gait and mobility: Secondary | ICD-10-CM

## 2021-03-23 DIAGNOSIS — R482 Apraxia: Secondary | ICD-10-CM

## 2021-03-23 DIAGNOSIS — R2681 Unsteadiness on feet: Secondary | ICD-10-CM

## 2021-03-23 DIAGNOSIS — R278 Other lack of coordination: Secondary | ICD-10-CM

## 2021-03-23 NOTE — Therapy (Signed)
[place] MAIN [facility] SERVICES [address] [place], [place], [address] Phone: [phone]   Fax:  [phone]  Physical Therapy Treatment  Patient Details  Name: Cassandra Schaefer MRN: [id number] Date of Birth: [birth year] Referring Provider (PT): Dr. Dew/Dr. Ginette Pitman PCP   Encounter Date: 03/23/2021   PT End of Session - 03/23/21 1704     Visit Number 27    Number of Visits 38    Date for PT Re-Evaluation 04/27/21    Authorization Type [employer]    Authorization Time Period 02/02/21-04/27/21    Progress Note Due on Visit 20    PT Start Time 1518    PT Stop Time 1558    PT Time Calculation (min) 40 min    Equipment Utilized During Treatment Gait belt    Activity Tolerance Patient tolerated treatment well    Behavior During Therapy WFL for tasks assessed/performed             Past Medical History:  Diagnosis Date   Anemia    Cough    RESOLVING, NO FEVER   Diabetes mellitus without complication ([place])    Gout    Hypertension    Hypothyroidism    PUD (peptic ulcer disease)    Stroke [facility])    Wears dentures    full upper, partial lower    Past Surgical History:  Procedure Laterality Date   AMPUTATION TOE Left 12/14/2017   Procedure: AMPUTATION TOE-4TH MPJ;  Surgeon: Samara Deist, DPM;  Location: [facility];  Service: Podiatry;  Laterality: Left;  IVA LOCAL Diabetic - oral meds   APPENDECTOMY     CATARACT EXTRACTION W/PHACO Right 06/23/2015   Procedure: CATARACT EXTRACTION PHACO AND INTRAOCULAR LENS PLACEMENT (IOC);  Surgeon: Estill Cotta, MD;  Location: [facility];  Service: Ophthalmology;  Laterality: Right;  [place] 01:28 AP% 23.4 CDE 36.14 fluid pack lot # [id number]   CATARACT EXTRACTION W/PHACO Left 07/28/2015   Procedure: CATARACT EXTRACTION PHACO AND INTRAOCULAR LENS PLACEMENT (IOC);  Surgeon: Estill Cotta, MD;  Location: [facility];  Service: Ophthalmology;  Laterality: Left;  [place]    1:29.7 AP%  24.9 CDE    41.32 fluid casette lot #[id number] H exp 09/23/2016   COONOSCOPY     AND ENDOSCOPY   DILATION AND CURETTAGE OF UTERUS     ENDARTERECTOMY Left 11/13/2020   Procedure: Evacuation of left neck hematoma;  Surgeon: Katha Cabal, MD;  Location: [facility];  Service: Vascular;  Laterality: Left;   ENDARTERECTOMY Left 11/13/2020   Procedure: ENDARTERECTOMY CAROTID;  Surgeon: Algernon Huxley, MD;  Location: [facility];  Service: Vascular;  Laterality: Left;   TONSILLECTOMY     TUBAL LIGATION      There were no vitals filed for this visit.   Subjective Assessment - 03/23/21 1520     Subjective Pt reports stumble over the last few days. She went to grab her walker and states, "I did the unforgivable," to stand up. She has bandage on R forearm because pt states she "tore skin off." Her daughter helped her by bandaging her R forearm. She reports she is OK.    Patient is accompained by: --   Daughter, Cassandra Schaefer   Pertinent History 87 y.o. female with medical history significant for type 2 diabetes mellitus, essential hypertension, acquired hypothyroidism, hyperlipidemia, stage III chronic kidney disease reports increased right sided weakness on 10/19/20. She was diagnosed with left basal ganglia ischemic stroke. She was discharged to inpatient rehab for 10 days  and then discharged home. Patient was evaluated by outpatient PT on 11/12/20. CVA was due to carotid stenosis. She underwent left carotid endartectomy on 7/21. Procedure went well however patient suffered from left cervical hematoma and had subsequent surgical procedure to stop the bleed and remove the hematoma on 11/13/20. They were concerned with her breathing and therefore intubated her for 1 day, she was in ICU for 2 days and transferred to med/surge on 11/15/20. Patient was discharged home on 11/17/20 and is now returning to outpatient PT. She has had the stiches removed and is healing well. Denies any neck pain or stiffness. She lives with her daughter and has  24/7 caregiver support. She is still using RW for most ambulation. She is able to transfer to bedside commode well and is able to stand short time unsupported. She reports feeling very weak and fatigued. She reports she has been dragging her right foot more. She denies any numbness/tingling; She reports feeling like her legs are wanting to do more but is hesitant because of fear of falling. She is mod I for self care morning routine. She does still receive help with showering. She has been working on increasing her activity but denies any formal exercise.    Limitations Standing;Walking    How long can you sit comfortably? NA    How long can you stand comfortably? 30-60 min with support    How long can you walk comfortably? about 100 feet with RW, still limited to short distances;    Diagnostic tests MRI shows left basal ganglia infarct 10/21/20    Patient Stated Goals "Improve balance and walking and not need RW, get back to independent."    Currently in Pain? No/denies    Pain Onset In the past 7 days             INTERVENTIONS    Ambulation approx. 3x148 ft with CGA and 2WW for muscular endurance and strength. Continued cues for sequencing/improved LE clearance/DF.  Ambulating with close CGA, RW over red mat 2 rounds- x several min, close CGA, pt difficulty with foot clearance  STS 1x5, 1x4 with no UE support. Pt demonstrates fatigue.   Nustep HIIT. Pt maintained SPM: 60s-70s Level 1 for 1 minute 30 sec Level 5 for 30 sec Level 1 for 1 minute Level 5 for 30 sec Level 1 for 1 minute  Level 4 for 30 sec  Distance travelled - 0.17 miles, followed by rest interval Close CGA-min a for mount/dismount. Pt monitored for exercise response throughout.  Step-ups onto and off of first stair step - 12x alternating BLEs  Ambulation approx. 1x148 ft with CGA and 2WW for muscular endurance and strength.    Pt educated throughout session about proper posture and technique with exercises.  Improved exercise technique, movement at target joints, use of target muscles after min to mod verbal, visual, tactile cues.    PT Short Term Goals - 03/02/21 1659       PT SHORT TERM GOAL #1   Title Patient will be adherent to HEP at least 3x a week to improve functional strength and balance for better safety at home.    Baseline 9/21: Pt reports compliance with HEP and is confident    Time 4    Period Weeks    Status Achieved    Target Date 12/31/20      PT SHORT TERM GOAL #2   Title Patient will be independent in transferring sit<>Stand without pushing on arm rests to  improve ability to get up from chair.    Baseline 9/21: pt able to complete STS without use of UEs    Time 4    Period Weeks    Status Achieved    Target Date 12/31/20               PT Long Term Goals - 03/02/21 1700       PT LONG TERM GOAL #1   Title Patient (> 58 years old) will complete five times sit to stand test in < 15 seconds indicating an increased LE strength and improved balance.    Baseline 9/21: 29 seconds hands-free 10/10: deferred 10/12: 34 sec hands free; 11/2: 23.8 sec hands-free    Time 8    Period Weeks    Status On-going    Target Date 04/27/21      PT LONG TERM GOAL #2   Title Patient will increase six minute walk test distance to >400 feet for improve gait ability    Baseline 9/21: 368 ft with RW; 10/10: deferred 10/12: 408 ft; 11/2: 476 ft with 4WW    Time 8    Period Weeks    Status Achieved    Target Date 04/27/21      PT LONG TERM GOAL #3   Title Patient will increase 10 meter walk test to >1.75ms as to improve gait speed for better community ambulation and to reduce fall risk.    Baseline 9/21: 0.5 m/s with RW; 10/10 deferred 10/12: 0.93 m/s with 4WW; 11/2: 0.83 m/s with 4WW    Time 8    Period Weeks    Status Partially Met    Target Date 04/27/21      PT LONG TERM GOAL #4   Title Patient will be independent with ascend/descend 6 steps using single UE in step over  step pattern without LOB.    Baseline 9/21:  Patient uses PT stairs (4 steps) with bilateral upper extremity support on handrails for both ascending/descending.  Patient exhibits reciprocal pattern ascending and step to pattern descending. 10/10: deferred; 10/12: ascending/descending recip. steps with BUE support; 11/2: Ascended/descended 8 steps total, reciprocal pattern ascending and step-to descending. Pt uses UUE support throughout with CGA assist.    Time 8    Period Weeks    Status Partially Met    Target Date 04/27/21      PT LONG TERM GOAL #5   Title Patient will demonstrate an improved Berg Balance Score of >45/56 as to demonstrate improved balance with ADLs such as sitting/standing and transfer balance and reduced fall risk.    Baseline 9/21: deferred d/t time; 9/29: 42/56; 10/10: deferred; 10/12: 44/56; 11/2: deferred; 11/7: 46/56    Time 8    Period Weeks    Status Achieved    Target Date 04/27/21      PT LONG TERM GOAL #6   Title Patient will improve FOTO score to >60% to indicate improved functional mobility with ADLs.    Baseline 9/21: 59; 10/10: 57; 11/2: 60    Time 8    Period Weeks    Status Partially Met    Target Date 04/27/21                   Plan - 03/23/21 1705     Clinical Impression Statement Pt able to progress interventions in volume and intensity again this session, indicating improved BLE strength, endurance and activity tolerance. Pt most challenged with ambulating over a compliant surface,  with greatest difficulty with foot clearance. The pt will benefit from further skilled PT to improve strength, gait, endurance, and mobility.    Personal Factors and Comorbidities Age    Comorbidities HTN, CKD, fall risk, type 2 diabetes,    Examination-Activity Limitations Lift;Locomotion Level;Squat;Stairs;Stand;Toileting;Transfers;Bathing    Examination-Participation Restrictions Cleaning;Community Activity;Laundry;Meal Prep;Shop;Volunteer;Driving     Stability/Clinical Decision Making Stable/Uncomplicated    Rehab Potential Poor    PT Frequency 2x / week    PT Duration 12 weeks    PT Treatment/Interventions Cryotherapy;Electrical Stimulation;Moist Heat;Gait training;Stair training;Functional mobility training;Therapeutic activities;Therapeutic exercise;Neuromuscular re-education;Balance training;Patient/family education;Orthotic Fit/Training;Energy conservation    PT Next Visit Plan work on LE strengthening and balance; gait training, endurance, continue POC as previously indicated    PT Home Exercise Plan Access Code: [email or web address]; no updates    Consulted and Agree with Plan of Care Patient             Patient will benefit from skilled therapeutic intervention in order to improve the following deficits and impairments:  Abnormal gait, Decreased balance, Decreased endurance, Decreased mobility, Difficulty walking, Cardiopulmonary status limiting activity, Decreased activity tolerance, Decreased coordination, Decreased strength  Visit Diagnosis: Muscle weakness (generalized)  Other abnormalities of gait and mobility  Other lack of coordination  Unsteadiness on feet     Problem List Patient Active Problem List   Diagnosis Date Noted   Carotid stenosis, symptomatic, with infarction ([place]) 11/13/2020   Gout flare 11/10/2020   UTI (urinary tract infection) 11/10/2020   Left carotid artery stenosis 11/10/2020   Chronic kidney disease 11/10/2020   Left basal ganglia embolic stroke ([place]) 16/24/4695   PAC (premature atrial contraction)    Pre-procedural cardiovascular examination    Ischemic stroke ([place]) 10/20/2020   TIA (transient ischemic attack) 10/19/2020   Right hemiparesis ([place]) 10/19/2020   Type 2 diabetes mellitus with stage 3 chronic kidney disease, without long-term current use of insulin ([place]) 08/24/2018   Hyperlipidemia, unspecified 07/19/2017   Hypertension 07/19/2017   PUD (peptic ulcer disease) 07/19/2017   Type 2  diabetes mellitus ([place]) 07/19/2017   Personal history of gout 08/12/2016   Anemia, unspecified 04/09/2016   Hypothyroidism, acquired 12/10/2015    Zollie Pee, PT 03/23/2021, 5:11 PM  [facility] [address] [place], [place], [address] Phone: [phone]   Fax:  [phone]  Name: Delayne Sanzo MRN: [id number] Date of Birth: 10-27-1928

## 2021-03-24 NOTE — Therapy (Signed)
[place] MAIN [facility] SERVICES [address] [place], [place], [address] Phone: [phone]   Fax:  [phone]  Occupational Therapy Treatment/Progress note Reporting period starting 02/16/2021-03/23/2021  Patient Details  Name: Cassandra Schaefer MRN: [id number] Date of Birth: [birth year] Referring Provider (OT): Dr. Tracie Harrier   Encounter Date: 03/23/2021   OT End of Session - 03/24/21 1656     Visit Number 30    Number of Visits 40    Date for OT Re-Evaluation 04/21/21    Authorization Time Period Reporting period starting 02/16/2021    OT Start Time 1605    OT Stop Time 1648    OT Time Calculation (min) 43 min    Equipment Utilized During Treatment tranport chair    Activity Tolerance Patient tolerated treatment well    Behavior During Therapy WFL for tasks assessed/performed             Past Medical History:  Diagnosis Date   Anemia    Cough    RESOLVING, NO FEVER   Diabetes mellitus without complication ([place])    Gout    Hypertension    Hypothyroidism    PUD (peptic ulcer disease)    Stroke [facility])    Wears dentures    full upper, partial lower    Past Surgical History:  Procedure Laterality Date   AMPUTATION TOE Left 12/14/2017   Procedure: AMPUTATION TOE-4TH MPJ;  Surgeon: Samara Deist, DPM;  Location: [place];  Service: Podiatry;  Laterality: Left;  IVA LOCAL Diabetic - oral meds   APPENDECTOMY     CATARACT EXTRACTION W/PHACO Right 06/23/2015   Procedure: CATARACT EXTRACTION PHACO AND INTRAOCULAR LENS PLACEMENT (IOC);  Surgeon: Estill Cotta, MD;  Location: [facility];  Service: Ophthalmology;  Laterality: Right;  [place] 01:28 AP% 23.4 CDE 36.14 fluid pack lot # [id number]   CATARACT EXTRACTION W/PHACO Left 07/28/2015   Procedure: CATARACT EXTRACTION PHACO AND INTRAOCULAR LENS PLACEMENT (IOC);  Surgeon: Estill Cotta, MD;  Location: [facility];  Service: Ophthalmology;  Laterality: Left;  [place]     1:29.7 AP%  24.9 CDE   41.32 fluid casette lot #[id number] H exp 09/23/2016   COONOSCOPY     AND ENDOSCOPY   DILATION AND CURETTAGE OF UTERUS     ENDARTERECTOMY Left 11/13/2020   Procedure: Evacuation of left neck hematoma;  Surgeon: Katha Cabal, MD;  Location: [facility];  Service: Vascular;  Laterality: Left;   ENDARTERECTOMY Left 11/13/2020   Procedure: ENDARTERECTOMY CAROTID;  Surgeon: Algernon Huxley, MD;  Location: [facility];  Service: Vascular;  Laterality: Left;   TONSILLECTOMY     TUBAL LIGATION      There were no vitals filed for this visit.   Subjective Assessment - 03/23/21 1649     Subjective  "I was happy with how I could use my arm better playing [employer]ino[employer] last week."    Patient is accompanied by: Family member    Pertinent History R ankle pain, gout, T2DM, HTN, CKDIII; R/L carotid endarectomy    Patient Stated Goals "I want to improve my aim when I'm using my R arm."    Currently in Pain? No/denies    Pain Score 0-No pain    Pain Onset In the past 7 days                [facility] OT Assessment - 03/24/21 0001       Assessment   Medical Diagnosis CVA with R sided hemiparesis  Hand Dominance Right      Observation/Other Assessments   Focus on Therapeutic Outcomes (FOTO)  60      Coordination   Right 9 Hole Peg Test 50 sec    Left 9 Hole Peg Test 46 sec      Hand Function   Right Hand Grip (lbs) 29    Right Hand Lateral Pinch 10 lbs    Right Hand 3 Point Pinch 10 lbs    Left Hand Grip (lbs) 30    Left Hand Lateral Pinch 11 lbs    Left 3 point pinch 9 lbs            Occupational Therapy Treatment/Progress Note: Self Care: Pt still using L hand to sip from a cup.  OT made recommendation for pt to switch to cup/mug with lid for all liquids to consistently engage R hand into sipping without fear of spilling.  May also benefit from cup with handle.  Pt practiced sipping with water bottle using R hand, requiring vc to keep bottle level until bottle comes to  mouth, pt otherwise started tipping bottle risking spill onto lap.  Able to correct with repeat cues.   Therapeutic Exercise: Objective measures taken as noted above, goals updated.  Facilitated [facility] for R hand picking up small pegs and placing into pegboard.  Pt is improving with being able to position and adjust peg within fingertips to place peg upright into board.  Pt continues to drop pegs frequently when picking them up, but is more challenged with this d/t numbness in fingertips.    Response to Treatment: See Plan/clinical impression below.    OT Education - 03/23/21 1652     Education Details engage RUE into sipping from a cup with use of mug/cup with lid and handle    Person(s) Educated Patient    Methods Explanation;Verbal cues;Demonstration    Comprehension Verbalized understanding;Verbal cues required              OT Short Term Goals - 03/23/21 1657       OT SHORT TERM GOAL #1   Title Pt will perform HEP for RUE strength/coordination independently.    Baseline Eval: HEP not yet established in outpatient, but pt is working with putty from [name]; 12/04/2020; [facility] HEP initiated this day with mod vc after return demo; 01/05/2021: indep with currently program, but ongoing as pt progresses; 01/28/2021: vc to revisit and focus on grip and pinch strengthening with putty; 03/23/21: pt is indep with HEP    Time 6    Period Weeks    Status Achieved    Target Date 03/23/21               OT Long Term Goals - 03/23/21 1659       OT LONG TERM GOAL #1   Title Pt will improve hand writing to 100% legibility with R hand to be able to independently write a check.    Baseline Eval: signature is 75% legible with R hand, requiring extra time; 12/04/2020: no change from eval; 01/05/2021: Printing is 100% legible, signature is 90% legible, but still requires extra time and effort.; 01/26/21: Signature is 90% legible and speed/fluidity have improved. 20th visit: 90% legible, 03/23/21: 90%  legible, extra time    Time 12    Period Weeks    Status On-going    Target Date 04/21/21      OT LONG TERM GOAL #2   Title Pt will improve R hand coordination  to enable good manipulation of iphone using R dominant hand    Baseline Eval: Pt uses L hand d/t R hand lacking sufficient [place] to press buttons on phone; 12/04/2020: no change from eval; 01/05/2021: R hand coordination is improving but pt still uses L hand to dial phone; 01/28/21: Pt using R dominant hand to press buttons and apps on phone 50% of the time.20th visit: Right hand [facility] skilols are improving, Pt. is improving with manipulation of the iphone; 03/23/21: Pt now consistently using R hand to press buttons on phone with good accuracy.    Time 12    Period Weeks    Status Achieved    Target Date 04/21/21      OT LONG TERM GOAL #3   Title Pt will improve GMC throughout RUE to enable pt to reach for and pick up ADL supplies with R dominant hand without dropping or knocking over objects.    Baseline Eval: Pt reports RUE is clumsy and easily knocks over objects when trying to reach for ADL supplies.  Pt verbalizes that her "aim is off."; 12/04/2020: pt reports slight improvement since eval and has started using her R hand to eat, but still feels clumsy; 01/05/2021: Greatly improved; pt is using silverware to eat with R hand, can comb hair with RUE, still mildly ataxic; 01/26/21: pt reports difficulty picking up objects within a small space (ie; may knock the toothpaste holder down when reaching for toothbrush), but reaching for light/larger objects is improving 20th visit: pt. continues to be mildly ataxic, however is consistently improving with motor control, and [place] while reaching targets; 03/23/21: Pt reaches with accuracy towards targets if she takes her time (pt drops and knocks things over when moving at a faster/normal pace)    Time 12    Period Weeks    Status On-going    Target Date 04/21/21      OT LONG TERM GOAL #4   Title Pt will  improve FOTO score to 68 or better to indicate measurable functional improvement.    Baseline Eval: FOTO 55; 01/05/2021: FOTO 61; 01/28/21: FOTO 61, 20th visit: 61; 03/23/21: FOTO 60    Time 12    Period Weeks    Status On-going    Target Date 04/21/21      OT LONG TERM GOAL #5   Title Pt will perform dynamic standing tasks with modified indep, AD as needed in order to reduce fall risk with ADLs    Baseline Eval: CGA-min A for all dynamic standing tasks using RW; 12/04/2020: pt is using RW in home with distant supv, but family still provides close supv on stairs; 01/05/2021: Pt now using RW to amb in and around the house, can perform ADLs with RW and modified indep, and can manage stairs at home with modified indep.  No falls reported.    Time 12    Period Weeks    Status Achieved    Target Date 02/01/21                   Plan - 03/23/21 1714     Clinical Impression Statement Pt making steady gains with OT goals.  Pt has met goal for using R hand with good accuracy to press buttons on her iphone.  Pt is improving with her aim and accuracy when reaching for objects with R hand, though pt states she focuses on slower movements to avoid knocking things over.  Pt continues to develop R hand strength  and coordination for improving manipulation of ADL supplies.  No significant changes in strength this reporting period, though may be due to some pain in forearm d/t recent large skin tear over the weekend.  Pt still tends to drop small objects in palm or when trying to reposition them within fingertips.  Pt will continue to benefit from skilled OT for further increasing RUE GMC/FMC and distal RUE strength in order to maximize indep, accuracy, and efficiency when engaging RUE into self care and leisure tasks.    OT Occupational Profile and History Detailed Assessment- Review of Records and additional review of physical, cognitive, psychosocial history related to current functional performance     Occupational performance deficits (Please refer to evaluation for details): ADL's;Leisure;IADL's    Body Structure / Function / Physical Skills ADL;Coordination;Endurance;GMC;UE functional use;Balance;Sensation;Body mechanics;IADL;Pain;Dexterity;FMC;Strength;Gait;Mobility    Rehab Potential Good    Clinical Decision Making Several treatment options, min-mod task modification necessary    Comorbidities Affecting Occupational Performance: May have comorbidities impacting occupational performance    Modification or Assistance to Complete Evaluation  Min-Moderate modification of tasks or assist with assess necessary to complete eval    OT Frequency 2x / week    OT Duration 12 weeks    OT Treatment/Interventions Self-care/ADL training;Therapeutic exercise;DME and/or AE instruction;Functional Mobility Training;Balance training;Neuromuscular education;Therapeutic activities;Patient/family education    Consulted and Agree with Plan of Care Patient             Patient will benefit from skilled therapeutic intervention in order to improve the following deficits and impairments:   Body Structure / Function / Physical Skills: ADL, Coordination, Endurance, GMC, UE functional use, Balance, Sensation, Body mechanics, IADL, Pain, Dexterity, FMC, Strength, Gait, Mobility       Visit Diagnosis: Muscle weakness (generalized)  Other lack of coordination  Apraxia    Problem List Patient Active Problem List   Diagnosis Date Noted   Carotid stenosis, symptomatic, with infarction ([place]) 11/13/2020   Gout flare 11/10/2020   UTI (urinary tract infection) 11/10/2020   Left carotid artery stenosis 11/10/2020   Chronic kidney disease 11/10/2020   Left basal ganglia embolic stroke ([place]) 56/43/3295   PAC (premature atrial contraction)    Pre-procedural cardiovascular examination    Ischemic stroke ([place]) 10/20/2020   TIA (transient ischemic attack) 10/19/2020   Right hemiparesis ([place]) 10/19/2020   Type  2 diabetes mellitus with stage 3 chronic kidney disease, without long-term current use of insulin ([place]) 08/24/2018   Hyperlipidemia, unspecified 07/19/2017   Hypertension 07/19/2017   PUD (peptic ulcer disease) 07/19/2017   Type 2 diabetes mellitus ([place]) 07/19/2017   Personal history of gout 08/12/2016   Anemia, unspecified 04/09/2016   Hypothyroidism, acquired 12/10/2015   Leta Speller, MS, OTR/L  Darleene Cleaver, OT/L 03/24/2021, 5:16 PM  [place] MAIN [facility] SERVICES [address] [place], [place], [address] Phone: [phone]   Fax:  [phone]  Name: Cassandra Schaefer MRN: [id number] Date of Birth: 10-14-1928

## 2021-03-25 ENCOUNTER — Ambulatory Visit: Payer: Medicare HMO

## 2021-03-25 ENCOUNTER — Encounter: Payer: Medicare HMO | Admitting: Speech Pathology

## 2021-03-25 ENCOUNTER — Other Ambulatory Visit: Payer: Self-pay

## 2021-03-25 DIAGNOSIS — R2689 Other abnormalities of gait and mobility: Secondary | ICD-10-CM | POA: Diagnosis not present

## 2021-03-25 DIAGNOSIS — M6281 Muscle weakness (generalized): Secondary | ICD-10-CM

## 2021-03-25 DIAGNOSIS — R278 Other lack of coordination: Secondary | ICD-10-CM

## 2021-03-25 DIAGNOSIS — R482 Apraxia: Secondary | ICD-10-CM

## 2021-03-25 DIAGNOSIS — R262 Difficulty in walking, not elsewhere classified: Secondary | ICD-10-CM

## 2021-03-25 DIAGNOSIS — R2681 Unsteadiness on feet: Secondary | ICD-10-CM

## 2021-03-25 NOTE — Therapy (Signed)
[place] [facility] MAIN [facility] SERVICES [address] [place], [place], [address] Phone: [phone]   Fax:  [phone]  Occupational Therapy Treatment  Patient Details  Name: Cassandra Schaefer MRN: [id number] Date of Birth: [birth year] Referring Provider (OT): Dr. Barbette Reichmann   Encounter Date: 03/25/2021   OT End of Session - 03/25/21 1657     Visit Number 31    Number of Visits 40    Date for OT Re-Evaluation 04/21/21    Authorization Time Period Reporting period starting 02/16/2021    OT Start Time 1604    OT Stop Time 1646    OT Time Calculation (min) 42 min    Equipment Utilized During Treatment tranport chair    Activity Tolerance Patient tolerated treatment well    Behavior During Therapy WFL for tasks assessed/performed             Past Medical History:  Diagnosis Date   Anemia    Cough    RESOLVING, NO FEVER   Diabetes mellitus without complication ([facility])    Gout    Hypertension    Hypothyroidism    PUD (peptic ulcer disease)    Stroke [facility])    Wears dentures    full upper, partial lower    Past Surgical History:  Procedure Laterality Date   AMPUTATION TOE Left 12/14/2017   Procedure: AMPUTATION TOE-4TH MPJ;  Surgeon: Gwyneth Revels, DPM;  Location: [facility];  Service: Podiatry;  Laterality: Left;  IVA LOCAL Diabetic - oral meds   APPENDECTOMY     CATARACT EXTRACTION W/PHACO Right 06/23/2015   Procedure: CATARACT EXTRACTION PHACO AND INTRAOCULAR LENS PLACEMENT (IOC);  Surgeon: Sallee Lange, MD;  Location: [facility];  Service: Ophthalmology;  Laterality: Right;  [place] 01:28 AP% 23.4 CDE 36.14 fluid pack lot # [id number]   CATARACT EXTRACTION W/PHACO Left 07/28/2015   Procedure: CATARACT EXTRACTION PHACO AND INTRAOCULAR LENS PLACEMENT (IOC);  Surgeon: Sallee Lange, MD;  Location: [facility];  Service: Ophthalmology;  Laterality: Left;  [place]    1:29.7 AP%  24.9 CDE   41.32 fluid casette lot #[id number] H exp  09/23/2016   COONOSCOPY     AND ENDOSCOPY   DILATION AND CURETTAGE OF UTERUS     ENDARTERECTOMY Left 11/13/2020   Procedure: Evacuation of left neck hematoma;  Surgeon: Renford Dills, MD;  Location: [facility];  Service: Vascular;  Laterality: Left;   ENDARTERECTOMY Left 11/13/2020   Procedure: ENDARTERECTOMY CAROTID;  Surgeon: Annice Needy, MD;  Location: [facility];  Service: Vascular;  Laterality: Left;   TONSILLECTOMY     TUBAL LIGATION      There were no vitals filed for this visit.   Subjective Assessment - 03/25/21 1656     Subjective  "My skin tear is doing better.  My daughter redressed it.  I want to try my grip strength again."    Patient is accompanied by: Family member    Pertinent History R ankle pain, gout, T2DM, HTN, CKDIII; R/L carotid endarectomy    Patient Stated Goals "I want to improve my aim when I'm using my R arm."    Currently in Pain? No/denies    Pain Score 0-No pain    Pain Onset In the past 7 days            Occupational Therapy Treatment: Self Care: Practiced handwriting with marker and whiteboard, working to increase fluidity of signature.  Vc for positioning to rest R forearm on table top/white board.  Pt verbalizes much improvement with legibility and fluidity.   Therapeutic Exercise: Facilitated hand strengthening with use of hand gripper set at 11.2# to place/remove jumbo pegs from pegboard x2 trials using R hand.  Addressed pinch strength having pt pull apart snap beads, vc needed for technique.  Practiced picking up beads 1 at a time and in handfuls, then discarding 1 at a time from palm.  Instructed pt in digit isolation exercises with digit abd/add and finger taps on table, then alternating taps between RF and SF.    Response to Treatment: See Plan/clinical impression below.    [facility] OT Assessment - 03/25/21 0001       Hand Function   Right Hand Grip (lbs) 34               OT Education - 03/25/21 1656     Education Details  digit isolation exercises for R hand    Person(s) Educated Patient    Methods Explanation;Verbal cues;Demonstration;Tactile cues    Comprehension Verbalized understanding;Verbal cues required;Returned demonstration;Tactile cues required              OT Short Term Goals - 03/23/21 1657       OT SHORT TERM GOAL #1   Title Pt will perform HEP for RUE strength/coordination independently.    Baseline Eval: HEP not yet established in outpatient, but pt is working with putty from [name]; 12/04/2020; [facility] HEP initiated this day with mod vc after return demo; 01/05/2021: indep with currently program, but ongoing as pt progresses; 01/28/2021: vc to revisit and focus on grip and pinch strengthening with putty; 03/23/21: pt is indep with HEP    Time 6    Period Weeks    Status Achieved    Target Date 03/23/21               OT Long Term Goals - 03/23/21 1659       OT LONG TERM GOAL #1   Title Pt will improve hand writing to 100% legibility with R hand to be able to independently write a check.    Baseline Eval: signature is 75% legible with R hand, requiring extra time; 12/04/2020: no change from eval; 01/05/2021: Printing is 100% legible, signature is 90% legible, but still requires extra time and effort.; 01/26/21: Signature is 90% legible and speed/fluidity have improved. 20th visit: 90% legible, 03/23/21: 90% legible, extra time    Time 12    Period Weeks    Status On-going    Target Date 04/21/21      OT LONG TERM GOAL #2   Title Pt will improve R hand coordination to enable good manipulation of iphone using R dominant hand    Baseline Eval: Pt uses L hand d/t R hand lacking sufficient [place] to press buttons on phone; 12/04/2020: no change from eval; 01/05/2021: R hand coordination is improving but pt still uses L hand to dial phone; 01/28/21: Pt using R dominant hand to press buttons and apps on phone 50% of the time.20th visit: Right hand [facility] skilols are improving, Pt. is improving with  manipulation of the iphone; 03/23/21: Pt now consistently using R hand to press buttons on phone with good accuracy.    Time 12    Period Weeks    Status Achieved    Target Date 04/21/21      OT LONG TERM GOAL #3   Title Pt will improve GMC throughout RUE to enable pt to reach for and pick up ADL supplies  with R dominant hand without dropping or knocking over objects.    Baseline Eval: Pt reports RUE is clumsy and easily knocks over objects when trying to reach for ADL supplies.  Pt verbalizes that her "aim is off."; 12/04/2020: pt reports slight improvement since eval and has started using her R hand to eat, but still feels clumsy; 01/05/2021: Greatly improved; pt is using silverware to eat with R hand, can comb hair with RUE, still mildly ataxic; 01/26/21: pt reports difficulty picking up objects within a small space (ie; may knock the toothpaste holder down when reaching for toothbrush), but reaching for light/larger objects is improving 20th visit: pt. continues to be mildly ataxic, however is consistently improving with motor control, and FMC while reaching targets; 03/23/21: Pt reaches with accuracy towards targets if she takes her time (pt drops and knocks things over when moving at a faster/normal pace)    Time 12    Period Weeks    Status On-going    Target Date 04/21/21      OT LONG TERM GOAL #4   Title Pt will improve FOTO score to 68 or better to indicate measurable functional improvement.    Baseline Eval: FOTO 55; 01/05/2021: FOTO 61; 01/28/21: FOTO 61, 20th visit: 61; 03/23/21: FOTO 60    Time 12    Period Weeks    Status On-going    Target Date 04/21/21      OT LONG TERM GOAL #5   Title Pt will perform dynamic standing tasks with modified indep, AD as needed in order to reduce fall risk with ADLs    Baseline Eval: CGA-min A for all dynamic standing tasks using RW; 12/04/2020: pt is using RW in home with distant supv, but family still provides close supv on stairs; 01/05/2021: Pt now  using RW to amb in and around the house, can perform ADLs with RW and modified indep, and can manage stairs at home with modified indep.  No falls reported.    Time 12    Period Weeks    Status Achieved    Target Date 02/01/21               Plan - 03/25/21 1712     Clinical Impression Statement Pt asked to Schaefer grip strength again today after weaker grip noted last session, likely d/t pain from recent skin tear on forearm.  Pt stated that her bandage had been pulling and daughter redressed it, and pt was able to squeeze 34 lbs of pressure today as compared to 29 lbs last visit.  Pt continues to develop R hand strength and [facility] with OT visits for better manipulation of ADL supplies.    OT Occupational Profile and History Detailed Assessment- Review of Records and additional review of physical, cognitive, psychosocial history related to current functional performance    Occupational performance deficits (Please refer to evaluation for details): ADL's;Leisure;IADL's    Body Structure / Function / Physical Skills ADL;Coordination;Endurance;GMC;UE functional use;Balance;Sensation;Body mechanics;IADL;Pain;Dexterity;FMC;Strength;Gait;Mobility    Rehab Potential Good    Clinical Decision Making Several treatment options, min-mod task modification necessary    Comorbidities Affecting Occupational Performance: May have comorbidities impacting occupational performance    Modification or Assistance to Complete Evaluation  Min-Moderate modification of tasks or assist with assess necessary to complete eval    OT Frequency 2x / week    OT Duration 12 weeks    OT Treatment/Interventions Self-care/ADL training;Therapeutic exercise;DME and/or AE instruction;Functional Mobility Training;Balance training;Neuromuscular education;Therapeutic activities;Patient/family education  Consulted and Agree with Plan of Care Patient             Patient will benefit from skilled therapeutic intervention in order  to improve the following deficits and impairments:   Body Structure / Function / Physical Skills: ADL, Coordination, Endurance, GMC, UE functional use, Balance, Sensation, Body mechanics, IADL, Pain, Dexterity, FMC, Strength, Gait, Mobility       Visit Diagnosis: Apraxia  Muscle weakness (generalized)  Other lack of coordination    Problem List Patient Active Problem List   Diagnosis Date Noted   Carotid stenosis, symptomatic, with infarction ([place]) 11/13/2020   Gout flare 11/10/2020   UTI (urinary tract infection) 11/10/2020   Left carotid artery stenosis 11/10/2020   Chronic kidney disease 11/10/2020   Left basal ganglia embolic stroke ([place]) [id number]   PAC (premature atrial contraction)    Pre-procedural cardiovascular examination    Ischemic stroke ([place]) 10/20/2020   TIA (transient ischemic attack) 10/19/2020   Right hemiparesis ([place]) 10/19/2020   Type 2 diabetes mellitus with stage 3 chronic kidney disease, without long-term current use of insulin ([place]) 08/24/2018   Hyperlipidemia, unspecified 07/19/2017   Hypertension 07/19/2017   PUD (peptic ulcer disease) 07/19/2017   Type 2 diabetes mellitus ([place]) 07/19/2017   Personal history of gout 08/12/2016   Anemia, unspecified 04/09/2016   Hypothyroidism, acquired 12/10/2015    Darleene Cleaver, OT/L 03/25/2021, 5:12 PM  [place] MAIN [facility] SERVICES [address] [place], [place], [address] Phone: [phone]   Fax:  [phone]  Name: Cassandra Schaefer MRN: [id number] Date of Birth: 12-Jun-1928

## 2021-03-25 NOTE — Therapy (Addendum)
[place] MAIN [facility] SERVICES [address] [place], [place], [address] Phone: [phone]   Fax:  [phone]  Physical Therapy Treatment  Patient Details  Name: Cassandra Schaefer MRN: [id number] Date of Birth: December 31, 1928 Referring Provider (PT): Dr. Dew/Dr. Ginette Pitman PCP   Encounter Date: 03/25/2021   PT End of Session - 03/25/21 1615     Visit Number 28    Number of Visits 38    Date for PT Re-Evaluation 04/27/21    Authorization Type [employer]    Authorization Time Period 02/02/21-04/27/21    Progress Note Due on Visit 20    PT Start Time 1516    PT Stop Time 1559    PT Time Calculation (min) 43 min    Equipment Utilized During Treatment Gait belt    Activity Tolerance Patient tolerated treatment well    Behavior During Therapy WFL for tasks assessed/performed             Past Medical History:  Diagnosis Date   Anemia    Cough    RESOLVING, NO FEVER   Diabetes mellitus without complication ([place])    Gout    Hypertension    Hypothyroidism    PUD (peptic ulcer disease)    Stroke [facility])    Wears dentures    full upper, partial lower    Past Surgical History:  Procedure Laterality Date   AMPUTATION TOE Left 12/14/2017   Procedure: AMPUTATION TOE-4TH MPJ;  Surgeon: Samara Deist, DPM;  Location: [facility];  Service: Podiatry;  Laterality: Left;  IVA LOCAL Diabetic - oral meds   APPENDECTOMY     CATARACT EXTRACTION W/PHACO Right 06/23/2015   Procedure: CATARACT EXTRACTION PHACO AND INTRAOCULAR LENS PLACEMENT (IOC);  Surgeon: Estill Cotta, MD;  Location: [facility];  Service: Ophthalmology;  Laterality: Right;  [place] 01:28 AP% 23.4 CDE 36.14 fluid pack lot # [id number]   CATARACT EXTRACTION W/PHACO Left 07/28/2015   Procedure: CATARACT EXTRACTION PHACO AND INTRAOCULAR LENS PLACEMENT (IOC);  Surgeon: Estill Cotta, MD;  Location: [facility];  Service: Ophthalmology;  Laterality: Left;  [place]    1:29.7 AP%  24.9 CDE    41.32 fluid casette lot #[id number] H exp 09/23/2016   COONOSCOPY     AND ENDOSCOPY   DILATION AND CURETTAGE OF UTERUS     ENDARTERECTOMY Left 11/13/2020   Procedure: Evacuation of left neck hematoma;  Surgeon: Katha Cabal, MD;  Location: [facility];  Service: Vascular;  Laterality: Left;   ENDARTERECTOMY Left 11/13/2020   Procedure: ENDARTERECTOMY CAROTID;  Surgeon: Algernon Huxley, MD;  Location: [facility];  Service: Vascular;  Laterality: Left;   TONSILLECTOMY     TUBAL LIGATION      There were no vitals filed for this visit.   Subjective Assessment - 03/25/21 1610     Subjective Pt reports she has had no new stumbles or falls since last visit. She reports that her abrasion on her arm from her stumble last week is healing well and she is thankful that her daughter is a [employer] scientist.    Patient is accompained by: --   Daughter, [name]   Pertinent History 87 y.o. female with medical history significant for type 2 diabetes mellitus, essential hypertension, acquired hypothyroidism, hyperlipidemia, stage III chronic kidney disease reports increased right sided weakness on 10/19/20. She was diagnosed with left basal ganglia ischemic stroke. She was discharged to inpatient rehab for 10 days and then discharged home. Patient was evaluated by outpatient PT on  11/12/20. CVA was due to carotid stenosis. She underwent left carotid endartectomy on 7/21. Procedure went well however patient suffered from left cervical hematoma and had subsequent surgical procedure to stop the bleed and remove the hematoma on 11/13/20. They were concerned with her breathing and therefore intubated her for 1 day, she was in ICU for 2 days and transferred to med/surge on 11/15/20. Patient was discharged home on 11/17/20 and is now returning to outpatient PT. She has had the stiches removed and is healing well. Denies any neck pain or stiffness. She lives with her daughter and has 24/7 caregiver support. She is still using RW for most ambulation.  She is able to transfer to bedside commode well and is able to stand short time unsupported. She reports feeling very weak and fatigued. She reports she has been dragging her right foot more. She denies any numbness/tingling; She reports feeling like her legs are wanting to do more but is hesitant because of fear of falling. She is mod I for self care morning routine. She does still receive help with showering. She has been working on increasing her activity but denies any formal exercise.    Limitations Standing;Walking    How long can you sit comfortably? NA    How long can you stand comfortably? 30-60 min with support    How long can you walk comfortably? about 100 feet with RW, still limited to short distances;    Diagnostic tests MRI shows left basal ganglia infarct 10/21/20    Patient Stated Goals "Improve balance and walking and not need RW, get back to independent."    Currently in Pain? No/denies    Pain Onset In the past 7 days             INTERVENTION:  Nustep HIIT. Pt maintained SPM: 60s-70s Level 1 for 1 minute  Level 4 for 30 sec Level 1 for 1 minute Level 5 for 30 sec Level 1 for 1 minute  Level 5 for 30 sec  Level 1 for 1 minute Level 5 for 30 sec Level 1 for 1 minute Distance travelled - 0.27 miles, followed by rest interval Close CGA-min a for mount/dismount. Pt monitored for exercise response throughout.  Ambulation approx. 296 ft with CGA and 2WW for muscular endurance and strength. Continued cues for sequencing/improved LE clearance/DF.   STS 2 x 5 with no UE support. Pt with more difficulty on the second set. Pt rates as medium   Step-ups onto and off of 4" stair x 10 steps alternating BLEs - progressed to with 2 lb ankle weights 2 x 10 reps. Pt had 2 episodes of not fully clearing the step on the R foot   Ambulation approx. 296 ft with CGA and 2WW and 2 lb ankle weights for muscular endurance and strength. Pt also cued to ambulate across yoga mat for  compliant surface training once per loop (2 loops).     Pt educated throughout session about proper posture and technique with exercises. Improved exercise technique, movement at target joints, use of target muscles after min to mod verbal, visual, tactile cues.             PT Education - 03/25/21 1613     Education Details exercise technique    Person(s) Educated Patient    Methods Explanation;Demonstration;Tactile cues;Verbal cues    Comprehension Verbalized understanding;Returned demonstration;Need further instruction              PT Short Term Goals -  03/02/21 1659       PT SHORT TERM GOAL #1   Title Patient will be adherent to HEP at least 3x a week to improve functional strength and balance for better safety at home.    Baseline 9/21: Pt reports compliance with HEP and is confident    Time 4    Period Weeks    Status Achieved    Target Date 12/31/20      PT SHORT TERM GOAL #2   Title Patient will be independent in transferring sit<>Stand without pushing on arm rests to improve ability to get up from chair.    Baseline 9/21: pt able to complete STS without use of UEs    Time 4    Period Weeks    Status Achieved    Target Date 12/31/20               PT Long Term Goals - 03/02/21 1700       PT LONG TERM GOAL #1   Title Patient (> 70 years old) will complete five times sit to stand test in < 15 seconds indicating an increased LE strength and improved balance.    Baseline 9/21: 29 seconds hands-free 10/10: deferred 10/12: 34 sec hands free; 11/2: 23.8 sec hands-free    Time 8    Period Weeks    Status On-going    Target Date 04/27/21      PT LONG TERM GOAL #2   Title Patient will increase six minute walk test distance to >400 feet for improve gait ability    Baseline 9/21: 368 ft with RW; 10/10: deferred 10/12: 408 ft; 11/2: 476 ft with 4WW    Time 8    Period Weeks    Status Achieved    Target Date 04/27/21      PT LONG TERM GOAL #3   Title  Patient will increase 10 meter walk test to >1.67ms as to improve gait speed for better community ambulation and to reduce fall risk.    Baseline 9/21: 0.5 m/s with RW; 10/10 deferred 10/12: 0.93 m/s with 4WW; 11/2: 0.83 m/s with 4WW    Time 8    Period Weeks    Status Partially Met    Target Date 04/27/21      PT LONG TERM GOAL #4   Title Patient will be independent with ascend/descend 6 steps using single UE in step over step pattern without LOB.    Baseline 9/21:  Patient uses PT stairs (4 steps) with bilateral upper extremity support on handrails for both ascending/descending.  Patient exhibits reciprocal pattern ascending and step to pattern descending. 10/10: deferred; 10/12: ascending/descending recip. steps with BUE support; 11/2: Ascended/descended 8 steps total, reciprocal pattern ascending and step-to descending. Pt uses UUE support throughout with CGA assist.    Time 8    Period Weeks    Status Partially Met    Target Date 04/27/21      PT LONG TERM GOAL #5   Title Patient will demonstrate an improved Berg Balance Score of >45/56 as to demonstrate improved balance with ADLs such as sitting/standing and transfer balance and reduced fall risk.    Baseline 9/21: deferred d/t time; 9/29: 42/56; 10/10: deferred; 10/12: 44/56; 11/2: deferred; 11/7: 46/56    Time 8    Period Weeks    Status Achieved    Target Date 04/27/21      PT LONG TERM GOAL #6   Title Patient will improve FOTO score to >  60% to indicate improved functional mobility with ADLs.    Baseline 9/21: 59; 10/10: 57; 11/2: 60    Time 8    Period Weeks    Status Partially Met    Target Date 04/27/21                   Plan - 03/25/21 1618     Clinical Impression Statement Pt presented with great motivation for all interventions today. She was able to progress strengthening and endurance exercise in volume to continue improving strength and activity tolerance. Pt showed improvements today in foot clearance on  compliant surfaces but was still challenged with foot clearance on the step. The pt will benefit from further skilled Pt to improve strength, gait, endurance, and mobility.    Personal Factors and Comorbidities Age    Comorbidities HTN, CKD, fall risk, type 2 diabetes,    Examination-Activity Limitations Lift;Locomotion Level;Squat;Stairs;Stand;Toileting;Transfers;Bathing    Examination-Participation Restrictions Cleaning;Community Activity;Laundry;Meal Prep;Shop;Volunteer;Driving    Stability/Clinical Decision Making Stable/Uncomplicated    Rehab Potential Poor    PT Frequency 2x / week    PT Duration 12 weeks    PT Treatment/Interventions Cryotherapy;Electrical Stimulation;Moist Heat;Gait training;Stair training;Functional mobility training;Therapeutic activities;Therapeutic exercise;Neuromuscular re-education;Balance training;Patient/family education;Orthotic Fit/Training;Energy conservation    PT Next Visit Plan work on LE strengthening and balance; gait training, endurance, continue POC as previously indicated    PT Home Exercise Plan Access Code: [email or web address]; no updates    Consulted and Agree with Plan of Care Patient             Patient will benefit from skilled therapeutic intervention in order to improve the following deficits and impairments:  Abnormal gait, Decreased balance, Decreased endurance, Decreased mobility, Difficulty walking, Cardiopulmonary status limiting activity, Decreased activity tolerance, Decreased coordination, Decreased strength  Visit Diagnosis: Muscle weakness (generalized)  Unsteadiness on feet  Difficulty in walking, not elsewhere classified     Problem List Patient Active Problem List   Diagnosis Date Noted   Carotid stenosis, symptomatic, with infarction ([place]) 11/13/2020   Gout flare 11/10/2020   UTI (urinary tract infection) 11/10/2020   Left carotid artery stenosis 11/10/2020   Chronic kidney disease 11/10/2020   Left basal ganglia embolic  stroke ([place]) 07/86/7544   PAC (premature atrial contraction)    Pre-procedural cardiovascular examination    Ischemic stroke ([place]) 10/20/2020   TIA (transient ischemic attack) 10/19/2020   Right hemiparesis ([place]) 10/19/2020   Type 2 diabetes mellitus with stage 3 chronic kidney disease, without long-term current use of insulin ([place]) 08/24/2018   Hyperlipidemia, unspecified 07/19/2017   Hypertension 07/19/2017   PUD (peptic ulcer disease) 07/19/2017   Type 2 diabetes mellitus ([place]) 07/19/2017   Personal history of gout 08/12/2016   Anemia, unspecified 04/09/2016   Hypothyroidism, acquired 12/10/2015  Ricard Dillon PT, DPT This entire session was performed under direct supervision and direction of a licensed therapist/therapist assistant . I have personally read, edited and approve of the note as written.  Karn Cassis, SPT  03/25/2021, 4:32 PM  [place] [facility] SERVICES [address] [place], [place], [address] Phone: [phone]   Fax:  [phone]  Name: Cassandra Schaefer MRN: [id number] Date of Birth: 1928-09-06

## 2021-03-30 ENCOUNTER — Ambulatory Visit: Payer: Medicare HMO

## 2021-03-30 ENCOUNTER — Other Ambulatory Visit: Payer: Self-pay

## 2021-03-30 ENCOUNTER — Encounter: Payer: Medicare HMO | Admitting: Speech Pathology

## 2021-03-30 ENCOUNTER — Ambulatory Visit: Payer: Medicare HMO | Attending: Internal Medicine

## 2021-03-30 DIAGNOSIS — M6281 Muscle weakness (generalized): Secondary | ICD-10-CM

## 2021-03-30 DIAGNOSIS — R278 Other lack of coordination: Secondary | ICD-10-CM | POA: Diagnosis present

## 2021-03-30 DIAGNOSIS — R262 Difficulty in walking, not elsewhere classified: Secondary | ICD-10-CM | POA: Insufficient documentation

## 2021-03-30 DIAGNOSIS — R2689 Other abnormalities of gait and mobility: Secondary | ICD-10-CM | POA: Diagnosis present

## 2021-03-30 DIAGNOSIS — R482 Apraxia: Secondary | ICD-10-CM | POA: Insufficient documentation

## 2021-03-30 DIAGNOSIS — R2681 Unsteadiness on feet: Secondary | ICD-10-CM | POA: Insufficient documentation

## 2021-03-30 NOTE — Therapy (Addendum)
[place] MAIN [facility] SERVICES [address] [place], [place], [address] Phone: [phone]   Fax:  [phone]  Physical Therapy Treatment  Patient Details  Name: Cassandra Schaefer MRN: [id number] Date of Birth: 09-16-1928 Referring Provider (PT): Dr. Dew/Dr. Ginette Pitman PCP   Encounter Date: 03/30/2021   PT End of Session - 03/30/21 1710     Visit Number 29    Number of Visits 38    Date for PT Re-Evaluation 04/27/21    Authorization Type [employer]    Authorization Time Period 02/02/21-04/27/21    Progress Note Due on Visit 20    PT Start Time 1517    PT Stop Time 1559    PT Time Calculation (min) 42 min    Equipment Utilized During Treatment Gait belt    Activity Tolerance Patient tolerated treatment well;Patient limited by pain;No increased pain    Behavior During Therapy WFL for tasks assessed/performed             Past Medical History:  Diagnosis Date   Anemia    Cough    RESOLVING, NO FEVER   Diabetes mellitus without complication ([place])    Gout    Hypertension    Hypothyroidism    PUD (peptic ulcer disease)    Stroke [facility])    Wears dentures    full upper, partial lower    Past Surgical History:  Procedure Laterality Date   AMPUTATION TOE Left 12/14/2017   Procedure: AMPUTATION TOE-4TH MPJ;  Surgeon: Samara Deist, DPM;  Location: [place];  Service: Podiatry;  Laterality: Left;  IVA LOCAL Diabetic - oral meds   APPENDECTOMY     CATARACT EXTRACTION W/PHACO Right 06/23/2015   Procedure: CATARACT EXTRACTION PHACO AND INTRAOCULAR LENS PLACEMENT (IOC);  Surgeon: Estill Cotta, MD;  Location: [facility];  Service: Ophthalmology;  Laterality: Right;  [place] 01:28 AP% 23.4 CDE 36.14 fluid pack lot # [id number]   CATARACT EXTRACTION W/PHACO Left 07/28/2015   Procedure: CATARACT EXTRACTION PHACO AND INTRAOCULAR LENS PLACEMENT (IOC);  Surgeon: Estill Cotta, MD;  Location: [facility];  Service: Ophthalmology;  Laterality:  Left;  [place]    1:29.7 AP%  24.9 CDE   41.32 fluid casette lot #[id number] H exp 09/23/2016   COONOSCOPY     AND ENDOSCOPY   DILATION AND CURETTAGE OF UTERUS     ENDARTERECTOMY Left 11/13/2020   Procedure: Evacuation of left neck hematoma;  Surgeon: Katha Cabal, MD;  Location: [facility];  Service: Vascular;  Laterality: Left;   ENDARTERECTOMY Left 11/13/2020   Procedure: ENDARTERECTOMY CAROTID;  Surgeon: Algernon Huxley, MD;  Location: [facility];  Service: Vascular;  Laterality: Left;   TONSILLECTOMY     TUBAL LIGATION      There were no vitals filed for this visit.   Subjective Assessment - 03/30/21 1430     Subjective Pt reports right ankle pain today rated 7/10 with redness and swelling. She states she had cheese dip last night and had a gout flare up the last time she had it that was similar. Pt used ice to bring the swelling down. She reports no other pain and denies any falls or stumbles.    Patient is accompained by: --   Daughter, Cassandra Schaefer   Pertinent History 87 y.o. female with medical history significant for type 2 diabetes mellitus, essential hypertension, acquired hypothyroidism, hyperlipidemia, stage III chronic kidney disease reports increased right sided weakness on 10/19/20. She was diagnosed with left basal ganglia ischemic stroke.  She was discharged to inpatient rehab for 10 days and then discharged home. Patient was evaluated by outpatient PT on 11/12/20. CVA was due to carotid stenosis. She underwent left carotid endartectomy on 7/21. Procedure went well however patient suffered from left cervical hematoma and had subsequent surgical procedure to stop the bleed and remove the hematoma on 11/13/20. They were concerned with her breathing and therefore intubated her for 1 day, she was in ICU for 2 days and transferred to med/surge on 11/15/20. Patient was discharged home on 11/17/20 and is now returning to outpatient PT. She has had the stiches removed and is healing well. Denies any neck pain  or stiffness. She lives with her daughter and has 24/7 caregiver support. She is still using RW for most ambulation. She is able to transfer to bedside commode well and is able to stand short time unsupported. She reports feeling very weak and fatigued. She reports she has been dragging her right foot more. She denies any numbness/tingling; She reports feeling like her legs are wanting to do more but is hesitant because of fear of falling. She is mod I for self care morning routine. She does still receive help with showering. She has been working on increasing her activity but denies any formal exercise.    Limitations Standing;Walking    How long can you sit comfortably? NA    How long can you stand comfortably? 30-60 min with support    How long can you walk comfortably? about 100 feet with RW, still limited to short distances;    Diagnostic tests MRI shows left basal ganglia infarct 10/21/20    Patient Stated Goals "Improve balance and walking and not need RW, get back to independent."    Currently in Pain? Yes    Pain Score 7     Pain Location Ankle    Pain Orientation Right    Pain Descriptors / Indicators Sharp    Pain Type Acute pain    Pain Onset Today              INTERVENTION:   Nustep HIIT. Pt maintained SPM: 60s-70s Level 1 for 1 minute  Level 5 for 30 sec Level 1 for 1 minute Level 5 for 30 sec (rates 5/10 RPE) Level 1 for 1 minute  Level 5 for 30 sec  Level 1 for 1 minute Level 5 for 30 sec Level 1 for 1 minute Distance travelled - 0.24 miles, followed by rest interval Close CGA-min a for mount/dismount. Pt monitored for exercise response throughout. SPO2 - 99%, HR - 92 bpm following exercise  Girth measurements: 24 cm on right ankle (mild swelling and redness anterior and inferior to medial malleolus), 23 cm on left ankle   LAQ with 3# ankle weights, 3 second hold 2 x 15 reps  GTB hamstring with 3# ankle weight curl 2 x 15 reps each side. Pt states more  difficulty on the R side  GTB seated hip abduction. 2 x 15, second set with 3 sec hold  Seated hip flexion march with 3# ankle weights 2 x 15 reps alternating sides.   Hip adductor seated ball squeeze with rainbow ball 2 x 15 reps  Seated heel raises with 3# ankle weights 2 x 15 reps   Pt educated throughout session about proper posture and technique with exercises. Improved exercise technique, movement at target joints, use of target muscles after min to mod verbal, visual, tactile cues.  Pt session limited today by R ankle  pain. Pt tolerated seated exercise well with no increased pain.        PT Education - 03/30/21 1609     Education Details exercise technique    Person(s) Educated Patient    Methods Explanation;Verbal cues;Demonstration    Comprehension Verbalized understanding;Need further instruction;Returned demonstration              PT Short Term Goals - 03/02/21 1659       PT SHORT TERM GOAL #1   Title Patient will be adherent to HEP at least 3x a week to improve functional strength and balance for better safety at home.    Baseline 9/21: Pt reports compliance with HEP and is confident    Time 4    Period Weeks    Status Achieved    Target Date 12/31/20      PT SHORT TERM GOAL #2   Title Patient will be independent in transferring sit<>Stand without pushing on arm rests to improve ability to get up from chair.    Baseline 9/21: pt able to complete STS without use of UEs    Time 4    Period Weeks    Status Achieved    Target Date 12/31/20               PT Long Term Goals - 03/02/21 1700       PT LONG TERM GOAL #1   Title Patient (> 19 years old) will complete five times sit to stand test in < 15 seconds indicating an increased LE strength and improved balance.    Baseline 9/21: 29 seconds hands-free 10/10: deferred 10/12: 34 sec hands free; 11/2: 23.8 sec hands-free    Time 8    Period Weeks    Status On-going    Target Date 04/27/21       PT LONG TERM GOAL #2   Title Patient will increase six minute walk test distance to >400 feet for improve gait ability    Baseline 9/21: 368 ft with RW; 10/10: deferred 10/12: 408 ft; 11/2: 476 ft with 4WW    Time 8    Period Weeks    Status Achieved    Target Date 04/27/21      PT LONG TERM GOAL #3   Title Patient will increase 10 meter walk test to >1.60ms as to improve gait speed for better community ambulation and to reduce fall risk.    Baseline 9/21: 0.5 m/s with RW; 10/10 deferred 10/12: 0.93 m/s with 4WW; 11/2: 0.83 m/s with 4WW    Time 8    Period Weeks    Status Partially Met    Target Date 04/27/21      PT LONG TERM GOAL #4   Title Patient will be independent with ascend/descend 6 steps using single UE in step over step pattern without LOB.    Baseline 9/21:  Patient uses PT stairs (4 steps) with bilateral upper extremity support on handrails for both ascending/descending.  Patient exhibits reciprocal pattern ascending and step to pattern descending. 10/10: deferred; 10/12: ascending/descending recip. steps with BUE support; 11/2: Ascended/descended 8 steps total, reciprocal pattern ascending and step-to descending. Pt uses UUE support throughout with CGA assist.    Time 8    Period Weeks    Status Partially Met    Target Date 04/27/21      PT LONG TERM GOAL #5   Title Patient will demonstrate an improved Berg Balance Score of >45/56 as to demonstrate improved balance with  ADLs such as sitting/standing and transfer balance and reduced fall risk.    Baseline 9/21: deferred d/t time; 9/29: 42/56; 10/10: deferred; 10/12: 44/56; 11/2: deferred; 11/7: 46/56    Time 8    Period Weeks    Status Achieved    Target Date 04/27/21      PT LONG TERM GOAL #6   Title Patient will improve FOTO score to >60% to indicate improved functional mobility with ADLs.    Baseline 9/21: 59; 10/10: 57; 11/2: 60    Time 8    Period Weeks    Status Partially Met    Target Date 04/27/21                    Plan - 03/30/21 1710     Clinical Impression Statement Pt presented with R ankle swelling and pain rated 7/10. Pt pain was increased with weight bearing, so session was limited to seated exercise. Pt tolerated all adapted exercises well with no increased pain. She was instructed to monitor her swelling and pain. The pt will benefit from further skilled PT to improve strength, gait, endurance, and mobility.    Personal Factors and Comorbidities Age    Comorbidities HTN, CKD, fall risk, type 2 diabetes,    Examination-Activity Limitations Lift;Locomotion Level;Squat;Stairs;Stand;Toileting;Transfers;Bathing    Examination-Participation Restrictions Cleaning;Community Activity;Laundry;Meal Prep;Shop;Volunteer;Driving    Stability/Clinical Decision Making Stable/Uncomplicated    Rehab Potential Poor    PT Frequency 2x / week    PT Duration 12 weeks    PT Treatment/Interventions Cryotherapy;Electrical Stimulation;Moist Heat;Gait training;Stair training;Functional mobility training;Therapeutic activities;Therapeutic exercise;Neuromuscular re-education;Balance training;Patient/family education;Orthotic Fit/Training;Energy conservation    PT Next Visit Plan work on LE strengthening and balance; gait training, endurance, continue POC as previously indicated    PT Home Exercise Plan Access Code: [email or web address]; no updates    Consulted and Agree with Plan of Care Patient             Patient will benefit from skilled therapeutic intervention in order to improve the following deficits and impairments:  Abnormal gait, Decreased balance, Decreased endurance, Decreased mobility, Difficulty walking, Cardiopulmonary status limiting activity, Decreased activity tolerance, Decreased coordination, Decreased strength  Visit Diagnosis: Muscle weakness (generalized)     Problem List Patient Active Problem List   Diagnosis Date Noted   Carotid stenosis, symptomatic, with infarction ([place])  11/13/2020   Gout flare 11/10/2020   UTI (urinary tract infection) 11/10/2020   Left carotid artery stenosis 11/10/2020   Chronic kidney disease 11/10/2020   Left basal ganglia embolic stroke ([place]) 56/43/3295   PAC (premature atrial contraction)    Pre-procedural cardiovascular examination    Ischemic stroke ([place]) 10/20/2020   TIA (transient ischemic attack) 10/19/2020   Right hemiparesis ([place]) 10/19/2020   Type 2 diabetes mellitus with stage 3 chronic kidney disease, without long-term current use of insulin ([place]) 08/24/2018   Hyperlipidemia, unspecified 07/19/2017   Hypertension 07/19/2017   PUD (peptic ulcer disease) 07/19/2017   Type 2 diabetes mellitus ([place]) 07/19/2017   Personal history of gout 08/12/2016   Anemia, unspecified 04/09/2016   Hypothyroidism, acquired 12/10/2015  Ricard Dillon PT, DPT This entire session was performed under direct supervision and direction of a licensed therapist/therapist assistant . I have personally read, edited and approve of the note as written.   Karn Cassis, SPT 03/30/2021, 5:20 PM  [place] MAIN [facility] SERVICES [address] [place], [place], [address] Phone: [phone]   Fax:  [phone]  Name: Janel Beane [facility]  MRN: [id number] Date of Birth: 1928-10-05

## 2021-03-30 NOTE — Therapy (Signed)
[place] MAIN [facility] SERVICES [address] [place], [place], [address] Phone: [phone]   Fax:  [phone]  Occupational Therapy Treatment  Patient Details  Name: Cassandra Schaefer MRN: [id number] Date of Birth: 1929/01/06 Referring Provider (OT): Dr. Tracie Harrier   Encounter Date: 03/30/2021   OT End of Session - 03/30/21 1642     Visit Number 32    Number of Visits 40    Date for OT Re-Evaluation 04/21/21    Authorization Time Period Reporting period starting 02/16/2021    OT Start Time 1516    OT Stop Time 1600    OT Time Calculation (min) 44 min    Equipment Utilized During Treatment tranport chair    Activity Tolerance Patient tolerated treatment well    Behavior During Therapy WFL for tasks assessed/performed             Past Medical History:  Diagnosis Date   Anemia    Cough    RESOLVING, NO FEVER   Diabetes mellitus without complication ([place])    Gout    Hypertension    Hypothyroidism    PUD (peptic ulcer disease)    Stroke [facility])    Wears dentures    full upper, partial lower    Past Surgical History:  Procedure Laterality Date   AMPUTATION TOE Left 12/14/2017   Procedure: AMPUTATION TOE-4TH MPJ;  Surgeon: Samara Deist, DPM;  Location: [place];  Service: Podiatry;  Laterality: Left;  IVA LOCAL Diabetic - oral meds   APPENDECTOMY     CATARACT EXTRACTION W/PHACO Right 06/23/2015   Procedure: CATARACT EXTRACTION PHACO AND INTRAOCULAR LENS PLACEMENT (IOC);  Surgeon: Estill Cotta, MD;  Location: [facility];  Service: Ophthalmology;  Laterality: Right;  [place] 01:28 AP% 23.4 CDE 36.14 fluid pack lot # [id number]   CATARACT EXTRACTION W/PHACO Left 07/28/2015   Procedure: CATARACT EXTRACTION PHACO AND INTRAOCULAR LENS PLACEMENT (IOC);  Surgeon: Estill Cotta, MD;  Location: [facility];  Service: Ophthalmology;  Laterality: Left;  [place]    1:29.7 AP%  24.9 CDE   41.32 fluid casette lot [id number] H exp  09/23/2016   COONOSCOPY     AND ENDOSCOPY   DILATION AND CURETTAGE OF UTERUS     ENDARTERECTOMY Left 11/13/2020   Procedure: Evacuation of left neck hematoma;  Surgeon: Katha Cabal, MD;  Location: [facility];  Service: Vascular;  Laterality: Left;   ENDARTERECTOMY Left 11/13/2020   Procedure: ENDARTERECTOMY CAROTID;  Surgeon: Algernon Huxley, MD;  Location: [facility];  Service: Vascular;  Laterality: Left;   TONSILLECTOMY     TUBAL LIGATION      There were no vitals filed for this visit.   Subjective Assessment - 03/30/21 1539     Subjective  "I got a new cup with a handle.  I'm doing well with it using my R hand."    Patient is accompanied by: Family member    Pertinent History R ankle pain, gout, T2DM, HTN, CKDIII; R/L carotid endarectomy    Patient Stated Goals "I want to improve my aim when I'm using my R arm."    Currently in Pain? Yes    Pain Score 7     Pain Location Ankle    Pain Orientation Left    Pain Descriptors / Indicators Sharp    Pain Type Acute pain    Pain Onset In the past 7 days    Pain Frequency Intermittent    Aggravating Factors  walking  Pain Relieving Factors gout meds    Effect of Pain on Daily Activities pain with walking    Multiple Pain Sites No           Occupational Therapy Treatment: Therapeutic Exercise: Facilitated pinch strengthening with use of therapy resistant clothespins to target lateral and 3 point pinch of R/L hands.  Able to pinch yellow, red (light resistance), green, blue (moderate resistance).  Attempted black, but too difficult.  Facilitated grip strengthening with use of hand gripper set at minimal resistance (1 red band) for 1 set 10 reps, then moderate resistance with 1 green band to complete 4 sets 10 reps for R/L grip strengthening (completed sets throughout duration of session to avoid ongoing/repetitive gripping).  Neuro re-ed: Practiced controlled reaching and small item pick up, placing and removing small pegs from  pegboard reaching between 2 posts several inches apart with goal to avoid knocking posts with R hand.  Practiced forward, contralateral and ipsilateral reaching with RUE.  Pt <50% accurate to reach between 2 posts without hitting posts with R arm.  Improved with cues to slow pace with reaching.  Response to Treatment: See Plan/clinical impression below.     OT Education - 03/30/21 1641     Education Details RUE GMC/FMC exercises    Person(s) Educated Patient    Methods Explanation;Verbal cues;Demonstration;Tactile cues    Comprehension Verbalized understanding;Verbal cues required;Returned demonstration              OT Short Term Goals - 03/23/21 1657       OT SHORT TERM GOAL #1   Title Pt will perform HEP for RUE strength/coordination independently.    Baseline Eval: HEP not yet established in outpatient, but pt is working with putty from [name]; 12/04/2020; [facility] HEP initiated this day with mod vc after return demo; 01/05/2021: indep with currently program, but ongoing as pt progresses; 01/28/2021: vc to revisit and focus on grip and pinch strengthening with putty; 03/23/21: pt is indep with HEP    Time 6    Period Weeks    Status Achieved    Target Date 03/23/21               OT Long Term Goals - 03/23/21 1659       OT LONG TERM GOAL #1   Title Pt will improve hand writing to 100% legibility with R hand to be able to independently write a check.    Baseline Eval: signature is 75% legible with R hand, requiring extra time; 12/04/2020: no change from eval; 01/05/2021: Printing is 100% legible, signature is 90% legible, but still requires extra time and effort.; 01/26/21: Signature is 90% legible and speed/fluidity have improved. 20th visit: 90% legible, 03/23/21: 90% legible, extra time    Time 12    Period Weeks    Status On-going    Target Date 04/21/21      OT LONG TERM GOAL #2   Title Pt will improve R hand coordination to enable good manipulation of iphone using R  dominant hand    Baseline Eval: Pt uses L hand d/t R hand lacking sufficient [place] to press buttons on phone; 12/04/2020: no change from eval; 01/05/2021: R hand coordination is improving but pt still uses L hand to dial phone; 01/28/21: Pt using R dominant hand to press buttons and apps on phone 50% of the time.20th visit: Right hand [facility] skilols are improving, Pt. is improving with manipulation of the iphone; 03/23/21: Pt now consistently using R  hand to press buttons on phone with good accuracy.    Time 12    Period Weeks    Status Achieved    Target Date 04/21/21      OT LONG TERM GOAL #3   Title Pt will improve GMC throughout RUE to enable pt to reach for and pick up ADL supplies with R dominant hand without dropping or knocking over objects.    Baseline Eval: Pt reports RUE is clumsy and easily knocks over objects when trying to reach for ADL supplies.  Pt verbalizes that her "aim is off."; 12/04/2020: pt reports slight improvement since eval and has started using her R hand to eat, but still feels clumsy; 01/05/2021: Greatly improved; pt is using silverware to eat with R hand, can comb hair with RUE, still mildly ataxic; 01/26/21: pt reports difficulty picking up objects within a small space (ie; may knock the toothpaste holder down when reaching for toothbrush), but reaching for light/larger objects is improving 20th visit: pt. continues to be mildly ataxic, however is consistently improving with motor control, and [place] while reaching targets; 03/23/21: Pt reaches with accuracy towards targets if she takes her time (pt drops and knocks things over when moving at a faster/normal pace)    Time 12    Period Weeks    Status On-going    Target Date 04/21/21      OT LONG TERM GOAL #4   Title Pt will improve FOTO score to 68 or better to indicate measurable functional improvement.    Baseline Eval: FOTO 55; 01/05/2021: FOTO 61; 01/28/21: FOTO 61, 20th visit: 61; 03/23/21: FOTO 60    Time 12    Period Weeks     Status On-going    Target Date 04/21/21      OT LONG TERM GOAL #5   Title Pt will perform dynamic standing tasks with modified indep, AD as needed in order to reduce fall risk with ADLs    Baseline Eval: CGA-min A for all dynamic standing tasks using RW; 12/04/2020: pt is using RW in home with distant supv, but family still provides close supv on stairs; 01/05/2021: Pt now using RW to amb in and around the house, can perform ADLs with RW and modified indep, and can manage stairs at home with modified indep.  No falls reported.    Time 12    Period Weeks    Status Achieved    Target Date 02/01/21              Plan - 03/30/21 1654     Clinical Impression Statement Pt continues to develop R hand strength and GMC/FMC with OT visits for better manipulation of ADL supplies.  RUE is moderately ataxic when engaging RUE with quicker movements, but mildly ataxic when pt slows her speed when reaching with RUE.  Pt reports that she bought a cup with a handle and lid and has been consistently using RUE for sipping all liquids at home, hot and cold, and reports improved accuracy and confidence with new cup.    OT Occupational Profile and History Detailed Assessment- Review of Records and additional review of physical, cognitive, psychosocial history related to current functional performance    Occupational performance deficits (Please refer to evaluation for details): ADL's;Leisure;IADL's    Body Structure / Function / Physical Skills ADL;Coordination;Endurance;GMC;UE functional use;Balance;Sensation;Body mechanics;IADL;Pain;Dexterity;FMC;Strength;Gait;Mobility    Rehab Potential Good    Clinical Decision Making Several treatment options, min-mod task modification necessary    Comorbidities Affecting  Occupational Performance: May have comorbidities impacting occupational performance    Modification or Assistance to Complete Evaluation  Min-Moderate modification of tasks or assist with assess necessary  to complete eval    OT Frequency 2x / week    OT Duration 12 weeks    OT Treatment/Interventions Self-care/ADL training;Therapeutic exercise;DME and/or AE instruction;Functional Mobility Training;Balance training;Neuromuscular education;Therapeutic activities;Patient/family education    Consulted and Agree with Plan of Care Patient             Patient will benefit from skilled therapeutic intervention in order to improve the following deficits and impairments:   Body Structure / Function / Physical Skills: ADL, Coordination, Endurance, GMC, UE functional use, Balance, Sensation, Body mechanics, IADL, Pain, Dexterity, FMC, Strength, Gait, Mobility       Visit Diagnosis: Muscle weakness (generalized)  Other lack of coordination    Problem List Patient Active Problem List   Diagnosis Date Noted   Carotid stenosis, symptomatic, with infarction (HCC) 11/13/2020   Gout flare 11/10/2020   UTI (urinary tract infection) 11/10/2020   Left carotid artery stenosis 11/10/2020   Chronic kidney disease 11/10/2020   Left basal ganglia embolic stroke (HCC) 10/24/2020   PAC (premature atrial contraction)    Pre-procedural cardiovascular examination    Ischemic stroke (HCC) 10/20/2020   TIA (transient ischemic attack) 10/19/2020   Right hemiparesis (HCC) 10/19/2020   Type 2 diabetes mellitus with stage 3 chronic kidney disease, without long-term current use of insulin (HCC) 08/24/2018   Hyperlipidemia, unspecified 07/19/2017   Hypertension 07/19/2017   PUD (peptic ulcer disease) 07/19/2017   Type 2 diabetes mellitus (HCC) 07/19/2017   Personal history of gout 08/12/2016   Anemia, unspecified 04/09/2016   Hypothyroidism, acquired 12/10/2015   Danelle Earthly, MS, OTR/L  Otis Dials, OT 03/30/2021, 4:54 PM  [place] [facility] MAIN [facility] SERVICES [address] [place], [place], [address] Phone: [phone]   Fax:  [phone]  Name: Cassandra Schaefer MRN: [id number] Date of Birth: 1928/10/05

## 2021-03-31 ENCOUNTER — Ambulatory Visit (INDEPENDENT_AMBULATORY_CARE_PROVIDER_SITE_OTHER): Payer: Medicare HMO | Admitting: Vascular Surgery

## 2021-03-31 ENCOUNTER — Ambulatory Visit (INDEPENDENT_AMBULATORY_CARE_PROVIDER_SITE_OTHER): Payer: Medicare HMO

## 2021-03-31 ENCOUNTER — Other Ambulatory Visit (INDEPENDENT_AMBULATORY_CARE_PROVIDER_SITE_OTHER): Payer: Self-pay | Admitting: Nurse Practitioner

## 2021-03-31 VITALS — BP 148/70 | HR 63 | Ht 63.0 in | Wt 109.0 lb

## 2021-03-31 DIAGNOSIS — E785 Hyperlipidemia, unspecified: Secondary | ICD-10-CM

## 2021-03-31 DIAGNOSIS — I6522 Occlusion and stenosis of left carotid artery: Secondary | ICD-10-CM | POA: Diagnosis not present

## 2021-03-31 DIAGNOSIS — I10 Essential (primary) hypertension: Secondary | ICD-10-CM | POA: Diagnosis not present

## 2021-03-31 DIAGNOSIS — I6523 Occlusion and stenosis of bilateral carotid arteries: Secondary | ICD-10-CM

## 2021-03-31 DIAGNOSIS — I63239 Cerebral infarction due to unspecified occlusion or stenosis of unspecified carotid arteries: Secondary | ICD-10-CM | POA: Diagnosis not present

## 2021-03-31 DIAGNOSIS — E119 Type 2 diabetes mellitus without complications: Secondary | ICD-10-CM

## 2021-03-31 NOTE — Assessment & Plan Note (Signed)
Her duplex today shows 1 to 39% right ICA stenosis and a widely patent left carotid endarterectomy without significant recurrent stenosis.  At this point, given her age particularly, I am okay with stopping the Plavix and just going to aspirin therapy.  We will do a follow-up duplex in 6 months.

## 2021-03-31 NOTE — Assessment & Plan Note (Signed)
blood pressure control important in reducing the progression of atherosclerotic disease. On appropriate oral medications.  

## 2021-03-31 NOTE — Progress Notes (Signed)
MRN : [id number]  Cassandra Schaefer is a 87 y.o. (04/10/1929) female who presents with chief complaint of  Chief Complaint  Patient presents with   Follow-up    3 Mo Carotid  .  History of Present Illness: Patient returns in follow-up of her carotid disease.  She is about 4 to 52-monthstatus post left carotid endarterectomy for stenosis with previous stroke.  She is doing reasonably well.  She still has some word finding issues and is going through rehabilitation slowly getting stronger.  She has no new complaints today.  Her duplex today shows 1 to 39% right ICA stenosis and a widely patent left carotid endarterectomy without significant recurrent stenosis.  Current Outpatient Medications  Medication Sig Dispense Refill   allopurinol (ZYLOPRIM) 100 MG tablet Take 100 mg by mouth daily.     aspirin 81 MG chewable tablet Chew 1 tablet (81 mg total) by mouth daily.     atorvastatin (LIPITOR) 80 MG tablet Take 1 tablet (80 mg total) by mouth daily after supper. 30 tablet 0   Blood Glucose Monitoring Suppl (FIFTY50 GLUCOSE METER 2.0) w/Device KIT Use as directed Dx code: 250.00     clopidogrel (PLAVIX) 75 MG tablet Take 1 tablet (75 mg total) by mouth daily. 30 tablet 0   colchicine 0.6 MG tablet Take 0.5 tablets (0.3 mg total) by mouth daily. 30 tablet 0   fosinopril (MONOPRIL) 10 MG tablet Take 1 tablet by mouth daily.     glucose blood test strip USE TO TEST BLOOD SUGAR DAILY. DX CODE: E11.9     iron polysaccharides (NIFEREX) 150 MG capsule Take by mouth.     JANUVIA 50 MG tablet Take 50 mg by mouth daily.     levothyroxine (SYNTHROID) 75 MCG tablet Take 75 mcg by mouth daily.     levothyroxine (SYNTHROID, LEVOTHROID) 88 MCG tablet Take 88 mcg by mouth daily before breakfast.     metFORMIN (GLUCOPHAGE-XR) 500 MG 24 hr tablet Take 1 tablet (500 mg total) by mouth at bedtime. 30 tablet 0   Multiple Vitamins-Minerals (ICAPS AREDS 2 PO) Take by mouth 2 (two) times daily. Reported on 06/23/2015      oxyCODONE-acetaminophen (PERCOCET/ROXICET) 5-325 MG tablet Take 1 tablet by mouth every 8 (eight) hours as needed for moderate pain or severe pain. 20 tablet 0   predniSONE (STERAPRED UNI-PAK 21 TAB) 5 MG (21) TBPK tablet Use as directed     vitamin B-12 (CYANOCOBALAMIN) 1000 MCG tablet Take 2,000 mcg by mouth daily.      potassium chloride (KLOR-CON) 10 MEQ tablet Take 10 mEq by mouth daily.     No current facility-administered medications for this visit.    Past Medical History:  Diagnosis Date   Anemia    Cough    RESOLVING, NO FEVER   Diabetes mellitus without complication ([place]    Gout    Hypertension    Hypothyroidism    PUD (peptic ulcer disease)    Stroke ([facility]    Wears dentures    full upper, partial lower    Past Surgical History:  Procedure Laterality Date   AMPUTATION TOE Left 12/14/2017   Procedure: AMPUTATION TOE-4TH MPJ;  Surgeon: FSamara Deist DPM;  [facility] [facility]  Service: Podiatry;  Laterality: Left;  IVA LOCAL Diabetic - oral meds   APPENDECTOMY     CATARACT EXTRACTION W/PHACO Right 06/23/2015   Procedure: CATARACT EXTRACTION PHACO AND INTRAOCULAR LENS PLACEMENT (IOC);  Surgeon: SEstill Cotta MD;  Location: [facility];  Service: Ophthalmology;  Laterality: Right;  [place] 01:28 AP% 23.4 CDE 36.14 fluid pack lot # [id number]   CATARACT EXTRACTION W/PHACO Left 07/28/2015   Procedure: CATARACT EXTRACTION PHACO AND INTRAOCULAR LENS PLACEMENT (IOC);  Surgeon: Estill Cotta, MD;  Location: [facility];  Service: Ophthalmology;  Laterality: Left;  [place]    1:29.7 AP%  24.9 CDE   41.32 fluid casette lot #[id number] H exp 09/23/2016   COONOSCOPY     AND ENDOSCOPY   DILATION AND CURETTAGE OF UTERUS     ENDARTERECTOMY Left 11/13/2020   Procedure: Evacuation of left neck hematoma;  Surgeon: Katha Cabal, MD;  Location: [facility];  Service: Vascular;  Laterality: Left;   ENDARTERECTOMY Left 11/13/2020   Procedure: ENDARTERECTOMY CAROTID;  Surgeon: Algernon Huxley, MD;  Location: [facility];  Service: Vascular;  Laterality: Left;   TONSILLECTOMY     TUBAL LIGATION       Social History   Tobacco Use   Smoking status: Former    Types: Cigarettes    Quit date: 12/26/1983    Years since quitting: 37.2   Smokeless tobacco: Never  Vaping Use   Vaping Use: Never used  Substance Use Topics   Alcohol use: No      Family History  Problem Relation Age of Onset   Breast cancer Neg Hx      Allergies  Allergen Reactions   Penicillins Hives   Sulfa Antibiotics Hives   Tetanus Toxoids Swelling   Macrobid [Nitrofurantoin] Nausea And Vomiting and Other (See Comments)    HYPOTENSION   Procaine Other (See Comments)    "went into shock"   Tuberculin Tests Swelling and Rash     REVIEW OF SYSTEMS (Negative unless checked)  Constitutional: '[]' Weight loss  '[]' Fever  '[]' Chills Cardiac: '[]' Chest pain   '[]' Chest pressure   '[]' Palpitations   '[]' Shortness of breath when laying flat   '[]' Shortness of breath at rest   '[]' Shortness of breath with exertion. Vascular:  '[]' Pain in legs with walking   '[]' Pain in legs at rest   '[]' Pain in legs when laying flat   '[]' Claudication   '[]' Pain in feet when walking  '[]' Pain in feet at rest  '[]' Pain in feet when laying flat   '[]' History of DVT   '[]' Phlebitis   '[]' Swelling in legs   '[]' Varicose veins   '[]' Non-healing ulcers Pulmonary:   '[]' Uses home oxygen   '[]' Productive cough   '[]' Hemoptysis   '[]' Wheeze  '[]' COPD   '[]' Asthma Neurologic:  '[]' Dizziness  '[]' Blackouts   '[]' Seizures   '[x]' History of stroke   '[]' History of TIA  '[]' Aphasia   '[]' Temporary blindness   '[]' Dysphagia   '[]' Weakness or numbness in arms   '[]' Weakness or numbness in legs Musculoskeletal:  '[x]' Arthritis   '[]' Joint swelling   '[]' Joint pain   '[]' Low back pain Hematologic:  '[]' Easy bruising  '[]' Easy bleeding   '[]' Hypercoagulable state   '[x]' Anemic  '[]' Hepatitis Gastrointestinal:  '[]' Blood in stool   '[]' Vomiting blood  '[x]' Gastroesophageal reflux/heartburn   '[]' Difficulty swallowing. Genitourinary:   '[]' Chronic kidney disease   '[]' Difficult urination  '[]' Frequent urination  '[]' Burning with urination   '[]' Blood in urine Skin:  '[]' Rashes   '[]' Ulcers   '[]' Wounds Psychological:  '[]' History of anxiety   '[]'  History of major depression.  Physical Examination  Vitals:   03/31/21 1527  BP: (!) 148/70  Pulse: 63  Weight: 109 lb (49.4 kg)  Height: '5\' 3"'  (1.6 m)   Body mass index is 19.31 kg/m. Gen:  WD/WN, NAD. Appears  younger than stated age. Head: [place]/AT, No temporalis wasting. Ear/Nose/Throat: Hearing grossly intact, nares w/o erythema or drainage, trachea midline Eyes: Conjunctiva clear. Sclera non-icteric Neck: Supple.  No bruit  Pulmonary:  Good air movement, equal and clear to auscultation bilaterally.  Cardiac: RRR, No JVD Vascular:  Vessel Right Left  Radial Palpable Palpable       Musculoskeletal:  No deformity or atrophy.  Using a rolling walker.  No significant lower extremity edema. Neurologic: CN 2-12 intact. Sensation grossly intact in extremities.  Still little choppy speech.  Psychiatric: Judgment intact, Mood & affect appropriate for pt's clinical situation. Dermatologic: No rashes or ulcers noted.  No cellulitis or open wounds.     CBC Lab Results  Component Value Date   WBC 13.5 (H) 11/17/2020   HGB 9.4 (L) 11/17/2020   HCT 29.0 (L) 11/17/2020   MCV 95.7 11/17/2020   PLT 204 11/17/2020    BMET    Component Value Date/Time   NA 139 11/15/2020 0726   K 4.2 11/15/2020 0726   CL 110 11/15/2020 0726   CO2 21 (L) 11/15/2020 0726   GLUCOSE 136 (H) 11/15/2020 0726   BUN 29 (H) 11/15/2020 0726   CREATININE 1.47 (H) 11/15/2020 0726   CALCIUM 8.6 (L) 11/15/2020 0726   GFRNONAA 34 (L) 11/15/2020 0726   CrCl cannot be calculated (Patient's most recent lab result is older than the maximum 21 days allowed.).  COAG Lab Results  Component Value Date   INR 1.1 10/19/2020    Radiology No results found.   Assessment/Plan Carotid stenosis, symptomatic, with  infarction [facility]) Her duplex today shows 1 to 39% right ICA stenosis and a widely patent left carotid endarterectomy without significant recurrent stenosis.  At this point, given her age particularly, I am okay with stopping the Plavix and just going to aspirin therapy.  We will do a follow-up duplex in 6 months.  Hypertension blood pressure control important in reducing the progression of atherosclerotic disease. On appropriate oral medications.   Type 2 diabetes mellitus (HCC) blood glucose control important in reducing the progression of atherosclerotic disease. Also, involved in wound healing. On appropriate medications.   Hyperlipidemia, unspecified lipid control important in reducing the progression of atherosclerotic disease. Continue statin therapy    Leotis Pain, MD  03/31/2021 4:09 PM    This note was created with Dragon medical transcription system.  Any errors from dictation are purely unintentional

## 2021-03-31 NOTE — Assessment & Plan Note (Signed)
lipid control important in reducing the progression of atherosclerotic disease. Continue statin therapy  

## 2021-03-31 NOTE — Assessment & Plan Note (Signed)
blood glucose control important in reducing the progression of atherosclerotic disease. Also, involved in wound healing. On appropriate medications.  

## 2021-04-01 ENCOUNTER — Encounter: Payer: Medicare HMO | Admitting: Speech Pathology

## 2021-04-01 ENCOUNTER — Ambulatory Visit: Payer: Medicare HMO

## 2021-04-01 ENCOUNTER — Other Ambulatory Visit: Payer: Self-pay

## 2021-04-01 DIAGNOSIS — M6281 Muscle weakness (generalized): Secondary | ICD-10-CM | POA: Diagnosis not present

## 2021-04-01 DIAGNOSIS — R278 Other lack of coordination: Secondary | ICD-10-CM

## 2021-04-01 DIAGNOSIS — R2689 Other abnormalities of gait and mobility: Secondary | ICD-10-CM

## 2021-04-01 NOTE — Therapy (Signed)
[place] MAIN [facility] SERVICES [address] [place], [place], [address] Phone: [phone]   Fax:  [phone]  Occupational Therapy Treatment  Patient Details  Name: Cassandra Schaefer MRN: [id number] Date of Birth: 11/17/1928 Referring Provider (OT): Dr. Tracie Harrier   Encounter Date: 04/01/2021   OT End of Session - 04/01/21 1612     Visit Number 33    Number of Visits 40    Date for OT Re-Evaluation 04/21/21    Authorization Time Period Reporting period starting 02/16/2021    OT Start Time 1517    OT Stop Time 1558    OT Time Calculation (min) 41 min    Equipment Utilized During Treatment tranport chair    Activity Tolerance Patient tolerated treatment well    Behavior During Therapy WFL for tasks assessed/performed             Past Medical History:  Diagnosis Date   Anemia    Cough    RESOLVING, NO FEVER   Diabetes mellitus without complication ([place])    Gout    Hypertension    Hypothyroidism    PUD (peptic ulcer disease)    Stroke [facility])    Wears dentures    full upper, partial lower    Past Surgical History:  Procedure Laterality Date   AMPUTATION TOE Left 12/14/2017   Procedure: AMPUTATION TOE-4TH MPJ;  Surgeon: Samara Deist, DPM;  Location: [place];  Service: Podiatry;  Laterality: Left;  IVA LOCAL Diabetic - oral meds   APPENDECTOMY     CATARACT EXTRACTION W/PHACO Right 06/23/2015   Procedure: CATARACT EXTRACTION PHACO AND INTRAOCULAR LENS PLACEMENT (IOC);  Surgeon: Estill Cotta, MD;  Location: [facility];  Service: Ophthalmology;  Laterality: Right;  [place] 01:28 AP% 23.4 CDE 36.14 fluid pack lot # [id number]   CATARACT EXTRACTION W/PHACO Left 07/28/2015   Procedure: CATARACT EXTRACTION PHACO AND INTRAOCULAR LENS PLACEMENT (IOC);  Surgeon: Estill Cotta, MD;  Location: [facility];  Service: Ophthalmology;  Laterality: Left;  [place]    1:29.7 AP%  24.9 CDE   41.32 fluid casette lot [id number] H exp  09/23/2016   COONOSCOPY     AND ENDOSCOPY   DILATION AND CURETTAGE OF UTERUS     ENDARTERECTOMY Left 11/13/2020   Procedure: Evacuation of left neck hematoma;  Surgeon: Katha Cabal, MD;  Location: [facility];  Service: Vascular;  Laterality: Left;   ENDARTERECTOMY Left 11/13/2020   Procedure: ENDARTERECTOMY CAROTID;  Surgeon: Algernon Huxley, MD;  Location: [facility];  Service: Vascular;  Laterality: Left;   TONSILLECTOMY     TUBAL LIGATION      There were no vitals filed for this visit.   Subjective Assessment - 04/01/21 1608     Subjective  "I can tell I'm using my hand better for things at home.  Especially with my new cup."    Patient is accompanied by: Family member    Pertinent History R ankle pain, gout, T2DM, HTN, CKDIII; R/L carotid endarectomy    Patient Stated Goals "I want to improve my aim when I'm using my R arm."    Currently in Pain? No/denies    Pain Score 0-No pain    Pain Onset In the past 7 days            Occupational Therapy Treatment: Neuro re-ed: Practiced GM and FM movements with RUE picking up small glass stones, storing them in palm, and discarding them 1 at a time, practiced flipping them  on table, placing stones onto upright jumbo pegs, then removing them without knocking them over.  Practiced forward, contralateral and ipsilateral reaching at table top level to place stones on top of pegs using R hand.  Therapeutic Exercise: Facilitated hand strengthening with hand gripper set at min resistance (1 red band) to complete 3 sets 10 reps of hand squeezes each hand.  Rest breaks between sets.  Instructed pt in digit isolation exercises for R hand, including digit abd/add and fingertaps/alternating fingers.   Response to Treatment: See Plan/clinical impression below.    OT Education - 04/01/21 1612     Education Details RUE GMC/FMC exercises    Person(s) Educated Patient    Methods Explanation;Verbal cues;Demonstration;Tactile cues    Comprehension  Verbalized understanding;Verbal cues required;Returned demonstration              OT Short Term Goals - 03/23/21 1657       OT SHORT TERM GOAL #1   Title Pt will perform HEP for RUE strength/coordination independently.    Baseline Eval: HEP not yet established in outpatient, but pt is working with putty from [name]; 12/04/2020; [facility] HEP initiated this day with mod vc after return demo; 01/05/2021: indep with currently program, but ongoing as pt progresses; 01/28/2021: vc to revisit and focus on grip and pinch strengthening with putty; 03/23/21: pt is indep with HEP    Time 6    Period Weeks    Status Achieved    Target Date 03/23/21               OT Long Term Goals - 03/23/21 1659       OT LONG TERM GOAL #1   Title Pt will improve hand writing to 100% legibility with R hand to be able to independently write a check.    Baseline Eval: signature is 75% legible with R hand, requiring extra time; 12/04/2020: no change from eval; 01/05/2021: Printing is 100% legible, signature is 90% legible, but still requires extra time and effort.; 01/26/21: Signature is 90% legible and speed/fluidity have improved. 20th visit: 90% legible, 03/23/21: 90% legible, extra time    Time 12    Period Weeks    Status On-going    Target Date 04/21/21      OT LONG TERM GOAL #2   Title Pt will improve R hand coordination to enable good manipulation of iphone using R dominant hand    Baseline Eval: Pt uses L hand d/t R hand lacking sufficient FMC to press buttons on phone; 12/04/2020: no change from eval; 01/05/2021: R hand coordination is improving but pt still uses L hand to dial phone; 01/28/21: Pt using R dominant hand to press buttons and apps on phone 50% of the time.20th visit: Right hand [facility] skilols are improving, Pt. is improving with manipulation of the iphone; 03/23/21: Pt now consistently using R hand to press buttons on phone with good accuracy.    Time 12    Period Weeks    Status Achieved    Target  Date 04/21/21      OT LONG TERM GOAL #3   Title Pt will improve GMC throughout RUE to enable pt to reach for and pick up ADL supplies with R dominant hand without dropping or knocking over objects.    Baseline Eval: Pt reports RUE is clumsy and easily knocks over objects when trying to reach for ADL supplies.  Pt verbalizes that her "aim is off."; 12/04/2020: pt reports slight improvement since eval  and has started using her R hand to eat, but still feels clumsy; 01/05/2021: Greatly improved; pt is using silverware to eat with R hand, can comb hair with RUE, still mildly ataxic; 01/26/21: pt reports difficulty picking up objects within a small space (ie; may knock the toothpaste holder down when reaching for toothbrush), but reaching for light/larger objects is improving 20th visit: pt. continues to be mildly ataxic, however is consistently improving with motor control, and [place] while reaching targets; 03/23/21: Pt reaches with accuracy towards targets if she takes her time (pt drops and knocks things over when moving at a faster/normal pace)    Time 12    Period Weeks    Status On-going    Target Date 04/21/21      OT LONG TERM GOAL #4   Title Pt will improve FOTO score to 68 or better to indicate measurable functional improvement.    Baseline Eval: FOTO 55; 01/05/2021: FOTO 61; 01/28/21: FOTO 61, 20th visit: 61; 03/23/21: FOTO 60    Time 12    Period Weeks    Status On-going    Target Date 04/21/21      OT LONG TERM GOAL #5   Title Pt will perform dynamic standing tasks with modified indep, AD as needed in order to reduce fall risk with ADLs    Baseline Eval: CGA-min A for all dynamic standing tasks using RW; 12/04/2020: pt is using RW in home with distant supv, but family still provides close supv on stairs; 01/05/2021: Pt now using RW to amb in and around the house, can perform ADLs with RW and modified indep, and can manage stairs at home with modified indep.  No falls reported.    Time 12     Period Weeks    Status Achieved    Target Date 02/01/21             Plan - 04/01/21 1629     Clinical Impression Statement Pt with slight increase in shakiness today in R arm.  Fatigued from PT visit.  Increased time for stone manipulation and placement of stones on pegs, frequent knocking over of pegs with R hand.  Improving with item pick up, storage, and discarding of items from palm of hand.  Pt struggled with digit isolation exercises, specifically difficulty isolating movements with 4th and 5th digits.  Pt will continue to benefit from skilled OT for increasing strength and coordination in R hand for improved use of R dominant arm with self care and leisure activities.    OT Occupational Profile and History Detailed Assessment- Review of Records and additional review of physical, cognitive, psychosocial history related to current functional performance    Occupational performance deficits (Please refer to evaluation for details): ADL's;Leisure;IADL's    Body Structure / Function / Physical Skills ADL;Coordination;Endurance;GMC;UE functional use;Balance;Sensation;Body mechanics;IADL;Pain;Dexterity;FMC;Strength;Gait;Mobility    Rehab Potential Good    Clinical Decision Making Several treatment options, min-mod task modification necessary    Comorbidities Affecting Occupational Performance: May have comorbidities impacting occupational performance    Modification or Assistance to Complete Evaluation  Min-Moderate modification of tasks or assist with assess necessary to complete eval    OT Frequency 2x / week    OT Duration 12 weeks    OT Treatment/Interventions Self-care/ADL training;Therapeutic exercise;DME and/or AE instruction;Functional Mobility Training;Balance training;Neuromuscular education;Therapeutic activities;Patient/family education    Consulted and Agree with Plan of Care Patient             Patient will benefit from skilled  therapeutic intervention in order to improve  the following deficits and impairments:   Body Structure / Function / Physical Skills: ADL, Coordination, Endurance, GMC, UE functional use, Balance, Sensation, Body mechanics, IADL, Pain, Dexterity, FMC, Strength, Gait, Mobility       Visit Diagnosis: Muscle weakness (generalized)  Other lack of coordination    Problem List Patient Active Problem List   Diagnosis Date Noted   Carotid stenosis, symptomatic, with infarction ([place]) 11/13/2020   Gout flare 11/10/2020   UTI (urinary tract infection) 11/10/2020   Left carotid artery stenosis 11/10/2020   Chronic kidney disease 11/10/2020   Left basal ganglia embolic stroke ([place]) [id number]   PAC (premature atrial contraction)    Pre-procedural cardiovascular examination    Ischemic stroke ([place]) 10/20/2020   TIA (transient ischemic attack) 10/19/2020   Right hemiparesis ([place]) 10/19/2020   Type 2 diabetes mellitus with stage 3 chronic kidney disease, without long-term current use of insulin ([place]) 08/24/2018   Hyperlipidemia, unspecified 07/19/2017   Hypertension 07/19/2017   PUD (peptic ulcer disease) 07/19/2017   Type 2 diabetes mellitus ([place]) 07/19/2017   Personal history of gout 08/12/2016   Anemia, unspecified 04/09/2016   Hypothyroidism, acquired 12/10/2015   Leta Speller, MS, OTR/L  Darleene Cleaver, OT 04/01/2021, 4:29 PM  [place] MAIN [facility] SERVICES [address] [place], [place], [address] Phone: [phone]   Fax:  [phone]  Name: Shaughnessy Brueck MRN: [id number] Date of Birth: 1928/09/17

## 2021-04-01 NOTE — Therapy (Addendum)
[place] MAIN [facility] [address] [place], [place], [address] Phone: [phone]   Fax:  [phone]  Physical Therapy Treatment/Physical Therapy Progress Note   Dates of reporting period  02/25/2021   to   04/01/2021  Patient Details  Name: Cassandra Schaefer MRN: [id number] Date of Birth: 08/31/1928 Referring Provider (PT): Dr. Dew/Dr. Ginette Pitman PCP   Encounter Date: 04/01/2021   PT End of Session - 04/02/21 0835     Visit Number 30    Number of Visits 38    Date for PT Re-Evaluation 04/27/21    Authorization Type [employer]    Authorization Time Period 02/02/21-04/27/21    Progress Note Due on Visit 40    PT Start Time 1431    PT Stop Time 1513    PT Time Calculation (min) 42 min    Equipment Utilized During Treatment Gait belt    Activity Tolerance Patient tolerated treatment well;Patient limited by pain;No increased pain;Patient limited by fatigue    Behavior During Therapy [facility] for tasks assessed/performed             Past Medical History:  Diagnosis Date   Anemia    Cough    RESOLVING, NO FEVER   Diabetes mellitus without complication ([place])    Gout    Hypertension    Hypothyroidism    PUD (peptic ulcer disease)    Stroke [facility])    Wears dentures    full upper, partial lower    Past Surgical History:  Procedure Laterality Date   AMPUTATION TOE Left 12/14/2017   Procedure: AMPUTATION TOE-4TH MPJ;  Surgeon: Samara Deist, DPM;  Location: [place];  Service: Podiatry;  Laterality: Left;  IVA LOCAL Diabetic - oral meds   APPENDECTOMY     CATARACT EXTRACTION W/PHACO Right 06/23/2015   Procedure: CATARACT EXTRACTION PHACO AND INTRAOCULAR LENS PLACEMENT (IOC);  Surgeon: Estill Cotta, MD;  Location: [facility];  Service: Ophthalmology;  Laterality: Right;  [place] 01:28 AP% 23.4 CDE 36.14 fluid pack lot # [id number]   CATARACT EXTRACTION W/PHACO Left 07/28/2015   Procedure: CATARACT EXTRACTION PHACO AND  INTRAOCULAR LENS PLACEMENT (IOC);  Surgeon: Estill Cotta, MD;  Location: [facility];  Service: Ophthalmology;  Laterality: Left;  [place]    1:29.7 AP%  24.9 CDE   41.32 fluid casette lot #[id number] H exp 09/23/2016   COONOSCOPY     AND ENDOSCOPY   DILATION AND CURETTAGE OF UTERUS     ENDARTERECTOMY Left 11/13/2020   Procedure: Evacuation of left neck hematoma;  Surgeon: Katha Cabal, MD;  Location: [facility];  Service: Vascular;  Laterality: Left;   ENDARTERECTOMY Left 11/13/2020   Procedure: ENDARTERECTOMY CAROTID;  Surgeon: Algernon Huxley, MD;  Location: [facility];  Service: Vascular;  Laterality: Left;   TONSILLECTOMY     TUBAL LIGATION      There were no vitals filed for this visit.   Subjective Assessment - 04/01/21 1433     Subjective Pt reports her right ankle pain has improved to a 2/10 and the redness and swelling has improved. Pt reports no falls or stumbles.    Patient is accompained by: --   Daughter, [name]   Pertinent History 87 y.o. female with medical history significant for type 2 diabetes mellitus, essential hypertension, acquired hypothyroidism, hyperlipidemia, stage III chronic kidney disease reports increased right sided weakness on 10/19/20. She was diagnosed with left basal ganglia ischemic stroke. She was discharged to inpatient rehab for 10 days and  then discharged home. Patient was evaluated by outpatient PT on 11/12/20. CVA was due to carotid stenosis. She underwent left carotid endartectomy on 7/21. Procedure went well however patient suffered from left cervical hematoma and had subsequent surgical procedure to stop the bleed and remove the hematoma on 11/13/20. They were concerned with her breathing and therefore intubated her for 1 day, she was in ICU for 2 days and transferred to med/surge on 11/15/20. Patient was discharged home on 11/17/20 and is now returning to outpatient PT. She has had the stiches removed and is healing well. Denies any neck pain or stiffness. She lives  with her daughter and has 24/7 caregiver support. She is still using RW for most ambulation. She is able to transfer to bedside commode well and is able to stand short time unsupported. She reports feeling very weak and fatigued. She reports she has been dragging her right foot more. She denies any numbness/tingling; She reports feeling like her legs are wanting to do more but is hesitant because of fear of falling. She is mod I for self care morning routine. She does still receive help with showering. She has been working on increasing her activity but denies any formal exercise.    Limitations Standing;Walking    How long can you sit comfortably? NA    How long can you stand comfortably? 30-60 min with support    How long can you walk comfortably? about 100 feet with RW, still limited to short distances;    Diagnostic tests MRI shows left basal ganglia infarct 10/21/20    Patient Stated Goals "Improve balance and walking and not need RW, get back to independent."    Pain Onset In the past 7 days             Goal Reassessment:  FOTO: 58% (goal of 60%) 5xSTS: 33.2 sec hands free with CGA 10MWT: 0.52 m/s with RW and CGA  Ambulation approx. 296 ft with CGA and 2WW for muscular endurance and strength. Continued cues for sequencing/improved LE clearance/DF.  Nustep HIIT. Pt maintained SPM: 60s-70s Level 1 for 1 minute  Level 5 for 30 sec (rates 7-8/10 RPE) Level 1 for 1 minute (4-6/10 RPE) Level 5 for 30 sec, pt reported some lightheadedness and required a rest break. Level 1 for 2 minutes   Distance travelled - 0.16 miles Close CGA-min a for mount/dismount. Pt monitored for exercise response throughout. SPO2 - 99%, HR - 98 bpm following exercise  Ambulation approx. 85 ft with CGA and 2WW for back to chair for rest interval. Blood pressure measured during rest break: 163/66 mmHg  LAQ with 3# ankle weights x 15 reps with 3 second hold. Pt rates as easy.   Pt displayed increased  fatigue today during intervention and reported a brief spell of dizziness during high intensity interval training, leading to a rest interval as pt recovered. Pt stated she wanted to continue exercise, but intensity was decreased to a more tolerable level. Pt response continued to be measured throughout. Pt goal reassessment likely affected by increased fatigue today.       PT Education - 04/02/21 0834     Education Details exercise technique, goal reassessment findings    Person(s) Educated Patient    Methods Explanation;Demonstration;Verbal cues    Comprehension Verbalized understanding;Returned demonstration;Need further instruction              PT Short Term Goals - 03/02/21 1659       PT SHORT TERM GOAL #  1   Title Patient will be adherent to HEP at least 3x a week to improve functional strength and balance for better safety at home.    Baseline 9/21: Pt reports compliance with HEP and is confident    Time 4    Period Weeks    Status Achieved    Target Date 12/31/20      PT SHORT TERM GOAL #2   Title Patient will be independent in transferring sit<>Stand without pushing on arm rests to improve ability to get up from chair.    Baseline 9/21: pt able to complete STS without use of UEs    Time 4    Period Weeks    Status Achieved    Target Date 12/31/20               PT Long Term Goals - 04/02/21 0836       PT LONG TERM GOAL #1   Title Patient (> 28 years old) will complete five times sit to stand test in < 15 seconds indicating an increased LE strength and improved balance.    Baseline 9/21: 29 seconds hands-free 10/10: deferred 10/12: 34 sec hands free; 11/2: 23.8 sec hands-free; 12/7: 33.2 sec hands free    Time 8    Period Weeks    Status On-going    Target Date 04/27/21      PT LONG TERM GOAL #2   Title Patient will increase six minute walk test distance to >400 feet for improve gait ability    Baseline 9/21: 368 ft with RW; 10/10: deferred 10/12: 408  ft; 11/2: 476 ft with 4WW    Time 8    Period Weeks    Status Achieved    Target Date 04/27/21      PT LONG TERM GOAL #3   Title Patient will increase 10 meter walk test to >1.44ms as to improve gait speed for better community ambulation and to reduce fall risk.    Baseline 9/21: 0.5 m/s with RW; 10/10 deferred 10/12: 0.93 m/s with 44ER 11/2: 0.83 m/s with 4WW; 12/7: 0.52 m/s with RW    Time 8    Period Weeks    Status Partially Met    Target Date 04/27/21      PT LONG TERM GOAL #4   Title Patient will be independent with ascend/descend 6 steps using single UE in step over step pattern without LOB.    Baseline 9/21:  Patient uses PT stairs (4 steps) with bilateral upper extremity support on handrails for both ascending/descending.  Patient exhibits reciprocal pattern ascending and step to pattern descending. 10/10: deferred; 10/12: ascending/descending recip. steps with BUE support; 11/2: Ascended/descended 8 steps total, reciprocal pattern ascending and step-to descending. Pt uses UUE support throughout with CGA assist.; 12/7: Ascended and descended 8 steps total with UUE support and CGA. Reciprocal stepping ascending and step to descending.    Time 8    Period Weeks    Status Partially Met    Target Date 04/27/21      PT LONG TERM GOAL #5   Title Patient will demonstrate an improved Berg Balance Score of >45/56 as to demonstrate improved balance with ADLs such as sitting/standing and transfer balance and reduced fall risk.    Baseline 9/21: deferred d/t time; 9/29: 42/56; 10/10: deferred; 10/12: 44/56; 11/2: deferred; 11/7: 46/56    Time 8    Period Weeks    Status Achieved    Target Date 04/27/21  PT LONG TERM GOAL #6   Title Patient will improve FOTO score to >60% to indicate improved functional mobility with ADLs.    Baseline 9/21: 59; 10/10: 57; 11/2: 60; 12/7: 58%    Time 8    Period Weeks    Status Partially Met    Target Date 04/27/21                    Plan - 04/02/21 0842     Clinical Impression Statement Pt states that she was feeling a bit weak today and was limited during the session due to fatigue. Pt's 5xSTS and 10MWT times were slower today, likely owing to excess fatigue and minimal R ankle pain. She experienced a brief bout of lightheadedness during Nustep exercise that subsided quickly during a rest break. Pt states that she had eaten plenty that day and did not think that was the issue. Pt was able to continue exercise at a lower intensity with no further adverse effects. The pt will benefit from further skilled PT to improve strength, gait, endurance, and mobility    Personal Factors and Comorbidities Age    Comorbidities HTN, CKD, fall risk, type 2 diabetes,    Examination-Activity Limitations Lift;Locomotion Level;Squat;Stairs;Stand;Toileting;Transfers;Bathing    Examination-Participation Restrictions Cleaning;Community Activity;Laundry;Meal Prep;Shop;Volunteer;Driving    Stability/Clinical Decision Making Stable/Uncomplicated    Rehab Potential Poor    PT Frequency 2x / week    PT Duration 12 weeks    PT Treatment/Interventions Cryotherapy;Electrical Stimulation;Moist Heat;Gait training;Stair training;Functional mobility training;Therapeutic activities;Therapeutic exercise;Neuromuscular re-education;Balance training;Patient/family education;Orthotic Fit/Training;Energy conservation    PT Next Visit Plan work on LE strengthening and balance; gait training, endurance, continue POC as previously indicated    PT Home Exercise Plan Access Code: [email or web address]; no updates    Consulted and Agree with Plan of Care Patient             Patient will benefit from skilled therapeutic intervention in order to improve the following deficits and impairments:  Abnormal gait, Decreased balance, Decreased endurance, Decreased mobility, Difficulty walking, Cardiopulmonary status limiting activity, Decreased activity tolerance, Decreased coordination,  Decreased strength  Visit Diagnosis: Muscle weakness (generalized)  Difficulty in walking, not elsewhere classified     Problem List Patient Active Problem List   Diagnosis Date Noted   Carotid stenosis, symptomatic, with infarction ([place]) 11/13/2020   Gout flare 11/10/2020   UTI (urinary tract infection) 11/10/2020   Left carotid artery stenosis 11/10/2020   Chronic kidney disease 11/10/2020   Left basal ganglia embolic stroke ([place]) 27/10/8673   PAC (premature atrial contraction)    Pre-procedural cardiovascular examination    Ischemic stroke ([place]) 10/20/2020   TIA (transient ischemic attack) 10/19/2020   Right hemiparesis ([place]) 10/19/2020   Type 2 diabetes mellitus with stage 3 chronic kidney disease, without long-term current use of insulin ([place]) 08/24/2018   Hyperlipidemia, unspecified 07/19/2017   Hypertension 07/19/2017   PUD (peptic ulcer disease) 07/19/2017   Type 2 diabetes mellitus ([place]) 07/19/2017   Personal history of gout 08/12/2016   Anemia, unspecified 04/09/2016   Hypothyroidism, acquired 12/10/2015  Ricard Dillon PT, DPT This entire session was performed under direct supervision and direction of a licensed therapist/therapist assistant . I have personally read, edited and approve of the note as written.   Karn Cassis, SPT  04/02/2021, 9:02 AM  [place] MAIN [facility] SERVICES [address] [place], [place], [address] Phone: [phone]   Fax:  [phone]  Name: Cassandra Schaefer MRN: [id number]  Date of Birth: February 22, 1929

## 2021-04-06 ENCOUNTER — Ambulatory Visit: Payer: Medicare HMO

## 2021-04-06 ENCOUNTER — Other Ambulatory Visit: Payer: Self-pay

## 2021-04-06 ENCOUNTER — Encounter: Payer: Medicare HMO | Admitting: Speech Pathology

## 2021-04-06 DIAGNOSIS — R262 Difficulty in walking, not elsewhere classified: Secondary | ICD-10-CM

## 2021-04-06 DIAGNOSIS — M6281 Muscle weakness (generalized): Secondary | ICD-10-CM

## 2021-04-06 DIAGNOSIS — R278 Other lack of coordination: Secondary | ICD-10-CM

## 2021-04-06 DIAGNOSIS — R2681 Unsteadiness on feet: Secondary | ICD-10-CM

## 2021-04-06 NOTE — Therapy (Addendum)
[place] MAIN [facility] SERVICES [address] [place], [place], [address] Phone: [phone]   Fax:  [phone]  Physical Therapy Treatment  Patient Details  Name: Cassandra Schaefer MRN: [id number] Date of Birth: [birth year] Referring Provider (PT): Dr. Dew/Dr. Ginette Pitman PCP   Encounter Date: 04/06/2021   PT End of Session - 04/06/21 1711     Visit Number 31    Number of Visits 38    Date for PT Re-Evaluation 04/27/21    Authorization Type [employer]    Authorization Time Period 02/02/21-04/27/21    Progress Note Due on Visit 40    PT Start Time 1430    PT Stop Time 1514    PT Time Calculation (min) 44 min    Equipment Utilized During Treatment Gait belt    Activity Tolerance Patient tolerated treatment well;Patient limited by pain;No increased pain;Patient limited by fatigue    Behavior During Therapy [facility] for tasks assessed/performed             Past Medical History:  Diagnosis Date   Anemia    Cough    RESOLVING, NO FEVER   Diabetes mellitus without complication ([place])    Gout    Hypertension    Hypothyroidism    PUD (peptic ulcer disease)    Stroke [facility])    Wears dentures    full upper, partial lower    Past Surgical History:  Procedure Laterality Date   AMPUTATION TOE Left 12/14/2017   Procedure: AMPUTATION TOE-4TH MPJ;  Surgeon: Samara Deist, DPM;  Location: [place];  Service: Podiatry;  Laterality: Left;  IVA LOCAL Diabetic - oral meds   APPENDECTOMY     CATARACT EXTRACTION W/PHACO Right 06/23/2015   Procedure: CATARACT EXTRACTION PHACO AND INTRAOCULAR LENS PLACEMENT (IOC);  Surgeon: Estill Cotta, MD;  Location: [facility];  Service: Ophthalmology;  Laterality: Right;  [place] 01:28 AP% 23.4 CDE 36.14 fluid pack lot # [id number]   CATARACT EXTRACTION W/PHACO Left 07/28/2015   Procedure: CATARACT EXTRACTION PHACO AND INTRAOCULAR LENS PLACEMENT (IOC);  Surgeon: Estill Cotta, MD;  Location: [facility];  Service:  Ophthalmology;  Laterality: Left;  [place]    1:29.7 AP%  24.9 CDE   41.32 fluid casette lot #[id number] H exp 09/23/2016   COONOSCOPY     AND ENDOSCOPY   DILATION AND CURETTAGE OF UTERUS     ENDARTERECTOMY Left 11/13/2020   Procedure: Evacuation of left neck hematoma;  Surgeon: Katha Cabal, MD;  Location: [facility];  Service: Vascular;  Laterality: Left;   ENDARTERECTOMY Left 11/13/2020   Procedure: ENDARTERECTOMY CAROTID;  Surgeon: Algernon Huxley, MD;  Location: [facility];  Service: Vascular;  Laterality: Left;   TONSILLECTOMY     TUBAL LIGATION      There were no vitals filed for this visit.   Subjective Assessment - 04/06/21 1436     Subjective Pt reports she is not having any pain today and denies any stumbles or falls.    Patient is accompained by: --   Daughter, Cassandra Schaefer   Pertinent History 87 y.o. female with medical history significant for type 2 diabetes mellitus, essential hypertension, acquired hypothyroidism, hyperlipidemia, stage III chronic kidney disease reports increased right sided weakness on 10/19/20. She was diagnosed with left basal ganglia ischemic stroke. She was discharged to inpatient rehab for 10 days and then discharged home. Patient was evaluated by outpatient PT on 11/12/20. CVA was due to carotid stenosis. She underwent left carotid endartectomy on 7/21. Procedure went  well however patient suffered from left cervical hematoma and had subsequent surgical procedure to stop the bleed and remove the hematoma on 11/13/20. They were concerned with her breathing and therefore intubated her for 1 day, she was in ICU for 2 days and transferred to med/surge on 11/15/20. Patient was discharged home on 11/17/20 and is now returning to outpatient PT. She has had the stiches removed and is healing well. Denies any neck pain or stiffness. She lives with her daughter and has 24/7 caregiver support. She is still using RW for most ambulation. She is able to transfer to bedside commode well and is  able to stand short time unsupported. She reports feeling very weak and fatigued. She reports she has been dragging her right foot more. She denies any numbness/tingling; She reports feeling like her legs are wanting to do more but is hesitant because of fear of falling. She is mod I for self care morning routine. She does still receive help with showering. She has been working on increasing her activity but denies any formal exercise.    Limitations Standing;Walking    How long can you sit comfortably? NA    How long can you stand comfortably? 30-60 min with support    How long can you walk comfortably? about 100 feet with RW, still limited to short distances;    Diagnostic tests MRI shows left basal ganglia infarct 10/21/20    Patient Stated Goals "Improve balance and walking and not need RW, get back to independent."    Pain Onset In the past 7 days             INTERVENTION  Nustep HIIT. Pt maintained SPM: 60s-70s Level 1 for 1 minute  Level 4 for 30 sec Level 1 for 1 minute Level 4 for 30 sec (rates 5/10 RPE) Level 1 for 1 minute  Level 4 for 30 sec  Level 1 for 1 minute Level 4 for 30 sec Level 1 for 1 minute Distance travelled - 0.25 miles, followed by rest interval Close CGA-min a for mount/dismount. Pt monitored for exercise response throughout. SPO2 - 99%, HR - 106 bpm following exercise  All exercises completed with CGA:  STS 2 x 5 with no UE support. Pt with more difficulty on the second set. Pt rates as medium.   Step-ups onto and off of 6" stair x 10 steps alternating BLEs - progressed to with 4 lb ankle weights  x 10 reps. Pt shows improved foot clearance, especially on the R   Standing hip abduction with 4# weights 2 x 15 each leg. Pt rates exercise as medium.  Standing hip extension with 4# weights 2 x 15 each leg. Pt rates exercise as medium. VC for decreased anterior sway.  Standing heel raise x 10 reps, 2 x 15 reps. Pt rates exercise as medium. VC for  decreased anterior sway.    Ambulation approx. 296 ft with CGA and 2WW for muscular endurance and strength. Continued cues for sequencing/improved LE clearance/DF.   Pt educated throughout session about proper posture and technique with exercises. Improved exercise technique, movement at target joints, use of target muscles after min to mod verbal, visual, tactile cues.       PT Education - 04/06/21 1710     Education Details exercise technique    Person(s) Educated Patient    Methods Explanation;Demonstration;Verbal cues    Comprehension Verbalized understanding;Returned demonstration;Need further instruction  PT Short Term Goals - 04/02/21 1525       PT SHORT TERM GOAL #1   Title Patient will be adherent to HEP at least 3x a week to improve functional strength and balance for better safety at home.    Baseline 9/21: Pt reports compliance with HEP and is confident    Time 4    Period Weeks    Status Achieved    Target Date 12/31/20      PT SHORT TERM GOAL #2   Title Patient will be independent in transferring sit<>Stand without pushing on arm rests to improve ability to get up from chair.    Baseline 9/21: pt able to complete STS without use of UEs    Time 4    Period Weeks    Status Achieved    Target Date 12/31/20               PT Long Term Goals - 04/02/21 0836       PT LONG TERM GOAL #1   Title Patient (> 75 years old) will complete five times sit to stand test in < 15 seconds indicating an increased LE strength and improved balance.    Baseline 9/21: 29 seconds hands-free 10/10: deferred 10/12: 34 sec hands free; 11/2: 23.8 sec hands-free; 12/7: 33.2 sec hands free    Time 8    Period Weeks    Status On-going    Target Date 04/27/21      PT LONG TERM GOAL #2   Title Patient will increase six minute walk test distance to >400 feet for improve gait ability    Baseline 9/21: 368 ft with RW; 10/10: deferred 10/12: 408 ft; 11/2: 476 ft with  4WW    Time 8    Period Weeks    Status Achieved    Target Date 04/27/21      PT LONG TERM GOAL #3   Title Patient will increase 10 meter walk test to >1.64ms as to improve gait speed for better community ambulation and to reduce fall risk.    Baseline 9/21: 0.5 m/s with RW; 10/10 deferred 10/12: 0.93 m/s with 48OL 11/2: 0.83 m/s with 4WW; 12/7: 0.52 m/s with RW    Time 8    Period Weeks    Status Partially Met    Target Date 04/27/21      PT LONG TERM GOAL #4   Title Patient will be independent with ascend/descend 6 steps using single UE in step over step pattern without LOB.    Baseline 9/21:  Patient uses PT stairs (4 steps) with bilateral upper extremity support on handrails for both ascending/descending.  Patient exhibits reciprocal pattern ascending and step to pattern descending. 10/10: deferred; 10/12: ascending/descending recip. steps with BUE support; 11/2: Ascended/descended 8 steps total, reciprocal pattern ascending and step-to descending. Pt uses UUE support throughout with CGA assist.; 12/7: Ascended and descended 8 steps total with UUE support and CGA. Reciprocal stepping ascending and step to descending.    Time 8    Period Weeks    Status Partially Met    Target Date 04/27/21      PT LONG TERM GOAL #5   Title Patient will demonstrate an improved Berg Balance Score of >45/56 as to demonstrate improved balance with ADLs such as sitting/standing and transfer balance and reduced fall risk.    Baseline 9/21: deferred d/t time; 9/29: 42/56; 10/10: deferred; 10/12: 44/56; 11/2: deferred; 11/7: 46/56    Time 8  Period Weeks    Status Achieved    Target Date 04/27/21      PT LONG TERM GOAL #6   Title Patient will improve FOTO score to >60% to indicate improved functional mobility with ADLs.    Baseline 9/21: 59; 10/10: 57; 11/2: 60; 12/7: 58%    Time 8    Period Weeks    Status Partially Met    Target Date 04/27/21                   Plan - 04/06/21 1711      Clinical Impression Statement Pt presents with great motivation today for all interventions. She is able to continue Nustep exercise with improved exercise tolerance today. Pt is able to continue foot clearance exercise but is challenged with foot clearance on a bigger step. She is able to complete all standing strengthening exercises with fewer rest breaks indicating improving exercise tolerance. The pt will benefit from further skilled PT to improve strength, gait, endurance, and mobility    Personal Factors and Comorbidities Age    Comorbidities HTN, CKD, fall risk, type 2 diabetes,    Examination-Activity Limitations Lift;Locomotion Level;Squat;Stairs;Stand;Toileting;Transfers;Bathing    Examination-Participation Restrictions Cleaning;Community Activity;Laundry;Meal Prep;Shop;Volunteer;Driving    Stability/Clinical Decision Making Stable/Uncomplicated    Rehab Potential Poor    PT Frequency 2x / week    PT Duration 12 weeks    PT Treatment/Interventions Cryotherapy;Electrical Stimulation;Moist Heat;Gait training;Stair training;Functional mobility training;Therapeutic activities;Therapeutic exercise;Neuromuscular re-education;Balance training;Patient/family education;Orthotic Fit/Training;Energy conservation    PT Next Visit Plan work on LE strengthening and balance; gait training, endurance, continue POC as previously indicated    PT Home Exercise Plan Access Code: [email or web address]; no updates    Consulted and Agree with Plan of Care Patient             Patient will benefit from skilled therapeutic intervention in order to improve the following deficits and impairments:  Abnormal gait, Decreased balance, Decreased endurance, Decreased mobility, Difficulty walking, Cardiopulmonary status limiting activity, Decreased activity tolerance, Decreased coordination, Decreased strength  Visit Diagnosis: Muscle weakness (generalized)  Difficulty in walking, not elsewhere classified  Unsteadiness on  feet     Problem List Patient Active Problem List   Diagnosis Date Noted   Carotid stenosis, symptomatic, with infarction ([place]) 11/13/2020   Gout flare 11/10/2020   UTI (urinary tract infection) 11/10/2020   Left carotid artery stenosis 11/10/2020   Chronic kidney disease 11/10/2020   Left basal ganglia embolic stroke ([place]) 66/09/3014   PAC (premature atrial contraction)    Pre-procedural cardiovascular examination    Ischemic stroke ([place]) 10/20/2020   TIA (transient ischemic attack) 10/19/2020   Right hemiparesis ([place]) 10/19/2020   Type 2 diabetes mellitus with stage 3 chronic kidney disease, without long-term current use of insulin ([place]) 08/24/2018   Hyperlipidemia, unspecified 07/19/2017   Hypertension 07/19/2017   PUD (peptic ulcer disease) 07/19/2017   Type 2 diabetes mellitus ([place]) 07/19/2017   Personal history of gout 08/12/2016   Anemia, unspecified 04/09/2016   Hypothyroidism, acquired 12/10/2015  Ricard Dillon PT, DPT This entire session was performed under direct supervision and direction of a licensed therapist/therapist assistant . I have personally read, edited and approve of the note as written.   Karn Cassis, SPT  04/06/2021, 5:27 PM  [place] MAIN [facility] SERVICES [address] [place], [place], [address] Phone: [phone]   Fax:  [phone]  Name: Cassandra Schaefer MRN: [id number] Date of Birth: July 23, 1928

## 2021-04-07 NOTE — Therapy (Signed)
[place] MAIN [facility] SERVICES [address] [place], [place], [address] Phone: [phone]   Fax:  [phone]  Occupational Therapy Treatment  Patient Details  Name: Cassandra Schaefer MRN: [id number] Date of Birth: 05/17/1928 Referring Provider (OT): Dr. Tracie Harrier   Encounter Date: 04/06/2021   OT End of Session - 04/07/21 1439     Visit Number 34    Number of Visits 40    Date for OT Re-Evaluation 04/21/21    Authorization Time Period Reporting period starting 02/16/2021    OT Start Time 1515    OT Stop Time 1600    OT Time Calculation (min) 45 min    Equipment Utilized During Treatment tranport chair    Activity Tolerance Patient tolerated treatment well    Behavior During Therapy WFL for tasks assessed/performed             Past Medical History:  Diagnosis Date   Anemia    Cough    RESOLVING, NO FEVER   Diabetes mellitus without complication ([place])    Gout    Hypertension    Hypothyroidism    PUD (peptic ulcer disease)    Stroke [facility])    Wears dentures    full upper, partial lower    Past Surgical History:  Procedure Laterality Date   AMPUTATION TOE Left 12/14/2017   Procedure: AMPUTATION TOE-4TH MPJ;  Surgeon: Samara Deist, DPM;  Location: [place];  Service: Podiatry;  Laterality: Left;  IVA LOCAL Diabetic - oral meds   APPENDECTOMY     CATARACT EXTRACTION W/PHACO Right 06/23/2015   Procedure: CATARACT EXTRACTION PHACO AND INTRAOCULAR LENS PLACEMENT (IOC);  Surgeon: Estill Cotta, MD;  Location: [facility];  Service: Ophthalmology;  Laterality: Right;  [place] 01:28 AP% 23.4 CDE 36.14 fluid pack lot # [id number]   CATARACT EXTRACTION W/PHACO Left 07/28/2015   Procedure: CATARACT EXTRACTION PHACO AND INTRAOCULAR LENS PLACEMENT (IOC);  Surgeon: Estill Cotta, MD;  Location: [facility];  Service: Ophthalmology;  Laterality: Left;  [place]    1:29.7 AP%  24.9 CDE   41.32 fluid casette lot [id number] H exp  09/23/2016   COONOSCOPY     AND ENDOSCOPY   DILATION AND CURETTAGE OF UTERUS     ENDARTERECTOMY Left 11/13/2020   Procedure: Evacuation of left neck hematoma;  Surgeon: Katha Cabal, MD;  Location: [facility];  Service: Vascular;  Laterality: Left;   ENDARTERECTOMY Left 11/13/2020   Procedure: ENDARTERECTOMY CAROTID;  Surgeon: Algernon Huxley, MD;  Location: [facility];  Service: Vascular;  Laterality: Left;   TONSILLECTOMY     TUBAL LIGATION      There were no vitals filed for this visit.   Subjective Assessment - 04/06/21 1438     Subjective  "My daughter re-dressed my bandage on my arm yesterday.  It's going to take awhile but it's looking better."    Patient is accompanied by: Family member    Pertinent History R ankle pain, gout, T2DM, HTN, CKDIII; R/L carotid endarectomy    Patient Stated Goals "I want to improve my aim when I'm using my R arm."    Currently in Pain? No/denies    Pain Score 0-No pain    Pain Onset In the past 7 days            Occupational Therapy Treatment: Therapeutic Exercise: Facilitated hand strengthening with use of hand gripper set at 11.2# for 1st trial, then 6.6# for 2nd trial, to place/remove jumbo pegs from pegboard  using R hand.    Neuro re-ed: Practiced R hand [place] with manipulation of jumbo pegs on table top, including standing pegs upright without tipping them over, rotating them 180 degrees between tips of fingers and thumb (vc to stabilize forearm on table top to target fingertip manipulation skills and avoid larger arm movements to turn the peg) , and gathering 3 pegs at a time, storing in hand, and discarding 1 at a time without dropping from palm; extra time needed for in hand manipulation activities.  Practiced picking up small beads and placing on pegboard with R hand, removing with various tweezer types including thin, wide, and scissor type tweezers.    Response to Treatment: See Plan/clinical impression below.    OT Education -  04/06/21 1438     Education Details RUE GMC/FMC exercises    Person(s) Educated Patient    Methods Explanation;Verbal cues;Demonstration;Tactile cues    Comprehension Verbalized understanding;Verbal cues required;Returned demonstration              OT Short Term Goals - 03/23/21 1657       OT SHORT TERM GOAL #1   Title Pt will perform HEP for RUE strength/coordination independently.    Baseline Eval: HEP not yet established in outpatient, but pt is working with putty from [name]; 12/04/2020; [facility] HEP initiated this day with mod vc after return demo; 01/05/2021: indep with currently program, but ongoing as pt progresses; 01/28/2021: vc to revisit and focus on grip and pinch strengthening with putty; 03/23/21: pt is indep with HEP    Time 6    Period Weeks    Status Achieved    Target Date 03/23/21               OT Long Term Goals - 03/23/21 1659       OT LONG TERM GOAL #1   Title Pt will improve hand writing to 100% legibility with R hand to be able to independently write a check.    Baseline Eval: signature is 75% legible with R hand, requiring extra time; 12/04/2020: no change from eval; 01/05/2021: Printing is 100% legible, signature is 90% legible, but still requires extra time and effort.; 01/26/21: Signature is 90% legible and speed/fluidity have improved. 20th visit: 90% legible, 03/23/21: 90% legible, extra time    Time 12    Period Weeks    Status On-going    Target Date 04/21/21      OT LONG TERM GOAL #2   Title Pt will improve R hand coordination to enable good manipulation of iphone using R dominant hand    Baseline Eval: Pt uses L hand d/t R hand lacking sufficient [place] to press buttons on phone; 12/04/2020: no change from eval; 01/05/2021: R hand coordination is improving but pt still uses L hand to dial phone; 01/28/21: Pt using R dominant hand to press buttons and apps on phone 50% of the time.20th visit: Right hand [facility] skilols are improving, Pt. is improving with  manipulation of the iphone; 03/23/21: Pt now consistently using R hand to press buttons on phone with good accuracy.    Time 12    Period Weeks    Status Achieved    Target Date 04/21/21      OT LONG TERM GOAL #3   Title Pt will improve GMC throughout RUE to enable pt to reach for and pick up ADL supplies with R dominant hand without dropping or knocking over objects.    Baseline Eval: Pt reports  RUE is clumsy and easily knocks over objects when trying to reach for ADL supplies.  Pt verbalizes that her "aim is off."; 12/04/2020: pt reports slight improvement since eval and has started using her R hand to eat, but still feels clumsy; 01/05/2021: Greatly improved; pt is using silverware to eat with R hand, can comb hair with RUE, still mildly ataxic; 01/26/21: pt reports difficulty picking up objects within a small space (ie; may knock the toothpaste holder down when reaching for toothbrush), but reaching for light/larger objects is improving 20th visit: pt. continues to be mildly ataxic, however is consistently improving with motor control, and [place] while reaching targets; 03/23/21: Pt reaches with accuracy towards targets if she takes her time (pt drops and knocks things over when moving at a faster/normal pace)    Time 12    Period Weeks    Status On-going    Target Date 04/21/21      OT LONG TERM GOAL #4   Title Pt will improve FOTO score to 68 or better to indicate measurable functional improvement.    Baseline Eval: FOTO 55; 01/05/2021: FOTO 61; 01/28/21: FOTO 61, 20th visit: 61; 03/23/21: FOTO 60    Time 12    Period Weeks    Status On-going    Target Date 04/21/21      OT LONG TERM GOAL #5   Title Pt will perform dynamic standing tasks with modified indep, AD as needed in order to reduce fall risk with ADLs    Baseline Eval: CGA-min A for all dynamic standing tasks using RW; 12/04/2020: pt is using RW in home with distant supv, but family still provides close supv on stairs; 01/05/2021: Pt now  using RW to amb in and around the house, can perform ADLs with RW and modified indep, and can manage stairs at home with modified indep.  No falls reported.    Time 12    Period Weeks    Status Achieved    Target Date 02/01/21               Plan - 04/06/21 1458     Clinical Impression Statement Pt continues to develop [facility] skills, reporting that she feels like her R hand is still 50-75% from her baseline.  R hand weakness, mild ataxia, and mild apraxia still present, which contribute to extra time and caution needed to engage RUE into self care tasks.  Pt will continue to benefit from skilled OT for increasing strength and coordination in R hand for improved use of R dominant arm with self care and leisure activities.    OT Occupational Profile and History Detailed Assessment- Review of Records and additional review of physical, cognitive, psychosocial history related to current functional performance    Occupational performance deficits (Please refer to evaluation for details): ADL's;Leisure;IADL's    Body Structure / Function / Physical Skills ADL;Coordination;Endurance;GMC;UE functional use;Balance;Sensation;Body mechanics;IADL;Pain;Dexterity;FMC;Strength;Gait;Mobility    Rehab Potential Good    Clinical Decision Making Several treatment options, min-mod task modification necessary    Comorbidities Affecting Occupational Performance: May have comorbidities impacting occupational performance    Modification or Assistance to Complete Evaluation  Min-Moderate modification of tasks or assist with assess necessary to complete eval    OT Frequency 2x / week    OT Duration 12 weeks    OT Treatment/Interventions Self-care/ADL training;Therapeutic exercise;DME and/or AE instruction;Functional Mobility Training;Balance training;Neuromuscular education;Therapeutic activities;Patient/family education    Consulted and Agree with Plan of Care Patient  Patient will benefit from  skilled therapeutic intervention in order to improve the following deficits and impairments:   Body Structure / Function / Physical Skills: ADL, Coordination, Endurance, GMC, UE functional use, Balance, Sensation, Body mechanics, IADL, Pain, Dexterity, FMC, Strength, Gait, Mobility       Visit Diagnosis: Muscle weakness (generalized)  Other lack of coordination    Problem List Patient Active Problem List   Diagnosis Date Noted   Carotid stenosis, symptomatic, with infarction (HCC) 11/13/2020   Gout flare 11/10/2020   UTI (urinary tract infection) 11/10/2020   Left carotid artery stenosis 11/10/2020   Chronic kidney disease 11/10/2020   Left basal ganglia embolic stroke (HCC) 10/24/2020   PAC (premature atrial contraction)    Pre-procedural cardiovascular examination    Ischemic stroke (HCC) 10/20/2020   TIA (transient ischemic attack) 10/19/2020   Right hemiparesis (HCC) 10/19/2020   Type 2 diabetes mellitus with stage 3 chronic kidney disease, without long-term current use of insulin (HCC) 08/24/2018   Hyperlipidemia, unspecified 07/19/2017   Hypertension 07/19/2017   PUD (peptic ulcer disease) 07/19/2017   Type 2 diabetes mellitus (HCC) 07/19/2017   Personal history of gout 08/12/2016   Anemia, unspecified 04/09/2016   Hypothyroidism, acquired 12/10/2015   Danelle Earthly, MS, OTR/L  [place], OT 04/07/2021, 2:58 PM  [place] [facility] MAIN [facility] SERVICES [address] [place], [place], [address] Phone: [phone]   Fax:  [phone]  Name: Cassandra Schaefer MRN: [id number] Date of Birth: 1928/05/10

## 2021-04-08 ENCOUNTER — Ambulatory Visit: Payer: Medicare HMO

## 2021-04-08 ENCOUNTER — Encounter: Payer: Medicare HMO | Admitting: Speech Pathology

## 2021-04-08 ENCOUNTER — Other Ambulatory Visit: Payer: Self-pay

## 2021-04-08 DIAGNOSIS — R278 Other lack of coordination: Secondary | ICD-10-CM

## 2021-04-08 DIAGNOSIS — M6281 Muscle weakness (generalized): Secondary | ICD-10-CM

## 2021-04-08 DIAGNOSIS — R2681 Unsteadiness on feet: Secondary | ICD-10-CM

## 2021-04-08 DIAGNOSIS — R2689 Other abnormalities of gait and mobility: Secondary | ICD-10-CM

## 2021-04-08 NOTE — Therapy (Addendum)
[place] MAIN [facility] SERVICES [address] [place], [place], [address] Phone: [phone]   Fax:  [phone]  Physical Therapy Treatment  Patient Details  Name: Cassandra Schaefer MRN: [id number] Date of Birth: 1929/01/17 Referring Provider (PT): Dr. Dew/Dr. Ginette Pitman PCP   Encounter Date: 04/08/2021   PT End of Session - 04/08/21 1609     Visit Number 32    Number of Visits 38    Date for PT Re-Evaluation 04/27/21    Authorization Type [employer]    Authorization Time Period 02/02/21-04/27/21    Progress Note Due on Visit 51    PT Start Time 1517    PT Stop Time 1559    PT Time Calculation (min) 42 min    Equipment Utilized During Treatment Gait belt    Activity Tolerance Patient tolerated treatment well    Behavior During Therapy WFL for tasks assessed/performed             Past Medical History:  Diagnosis Date   Anemia    Cough    RESOLVING, NO FEVER   Diabetes mellitus without complication ([place])    Gout    Hypertension    Hypothyroidism    PUD (peptic ulcer disease)    Stroke [facility])    Wears dentures    full upper, partial lower    Past Surgical History:  Procedure Laterality Date   AMPUTATION TOE Left 12/14/2017   Procedure: AMPUTATION TOE-4TH MPJ;  Surgeon: Samara Deist, DPM;  Location: [place];  Service: Podiatry;  Laterality: Left;  IVA LOCAL Diabetic - oral meds   APPENDECTOMY     CATARACT EXTRACTION W/PHACO Right 06/23/2015   Procedure: CATARACT EXTRACTION PHACO AND INTRAOCULAR LENS PLACEMENT (IOC);  Surgeon: Estill Cotta, MD;  Location: [facility];  Service: Ophthalmology;  Laterality: Right;  [place] 01:28 AP% 23.4 CDE 36.14 fluid pack lot # [id number]   CATARACT EXTRACTION W/PHACO Left 07/28/2015   Procedure: CATARACT EXTRACTION PHACO AND INTRAOCULAR LENS PLACEMENT (IOC);  Surgeon: Estill Cotta, MD;  Location: [facility];  Service: Ophthalmology;  Laterality: Left;  [place]    1:29.7 AP%  24.9 CDE    41.32 fluid casette lot #[id number] H exp 09/23/2016   COONOSCOPY     AND ENDOSCOPY   DILATION AND CURETTAGE OF UTERUS     ENDARTERECTOMY Left 11/13/2020   Procedure: Evacuation of left neck hematoma;  Surgeon: Katha Cabal, MD;  Location: [facility];  Service: Vascular;  Laterality: Left;   ENDARTERECTOMY Left 11/13/2020   Procedure: ENDARTERECTOMY CAROTID;  Surgeon: Algernon Huxley, MD;  Location: [facility];  Service: Vascular;  Laterality: Left;   TONSILLECTOMY     TUBAL LIGATION      There were no vitals filed for this visit.   Subjective Assessment - 04/08/21 1607     Subjective Pt reports she was able to walk in BJs yesterday with her walker for approximately 30 minutes. She denies any pain today and has not had any stumbles or falls since her last appointment.    Patient is accompained by: --   Daughter, [name]   Pertinent History 87 y.o. female with medical history significant for type 2 diabetes mellitus, essential hypertension, acquired hypothyroidism, hyperlipidemia, stage III chronic kidney disease reports increased right sided weakness on 10/19/20. She was diagnosed with left basal ganglia ischemic stroke. She was discharged to inpatient rehab for 10 days and then discharged home. Patient was evaluated by outpatient PT on 11/12/20. CVA was due to  carotid stenosis. She underwent left carotid endartectomy on 7/21. Procedure went well however patient suffered from left cervical hematoma and had subsequent surgical procedure to stop the bleed and remove the hematoma on 11/13/20. They were concerned with her breathing and therefore intubated her for 1 day, she was in ICU for 2 days and transferred to med/surge on 11/15/20. Patient was discharged home on 11/17/20 and is now returning to outpatient PT. She has had the stiches removed and is healing well. Denies any neck pain or stiffness. She lives with her daughter and has 24/7 caregiver support. She is still using RW for most ambulation. She is able to  transfer to bedside commode well and is able to stand short time unsupported. She reports feeling very weak and fatigued. She reports she has been dragging her right foot more. She denies any numbness/tingling; She reports feeling like her legs are wanting to do more but is hesitant because of fear of falling. She is mod I for self care morning routine. She does still receive help with showering. She has been working on increasing her activity but denies any formal exercise.    Limitations Standing;Walking    How long can you sit comfortably? NA    How long can you stand comfortably? 30-60 min with support    How long can you walk comfortably? about 100 feet with RW, still limited to short distances;    Diagnostic tests MRI shows left basal ganglia infarct 10/21/20    Patient Stated Goals "Improve balance and walking and not need RW, get back to independent."    Pain Onset In the past 7 days             INTERVENTION:  Nustep HIIT. Pt maintained SPM: 60s-70s Level 1 for 1 minute  Level 5 for 30 sec (pt reports some right upper thigh discomfort - regressed to level 4 for the next HIIT interval) Level 1 for 1 minute Level 4 for 30 sec (rates 7/8 RPE, denies any upper thigh pain) Level 1 for 1 minute  Level 4 for 30 sec  Level 1 for 1 minute 30 sec  Distance travelled - 0.20 miles, followed by rest interval Close CGA-min a for mount/dismount. Pt monitored for exercise response throughout. SPO2 - 98%, HR - 102 bpm following exercise  All of the following interventions with CGA unless otherwise specified:  Step-ups onto 6" step with added hip flexion march to improve knee drive, 4# ankle weights, alternating BLEs 1 x 5 reps, 1 x 8 reps. Pt has difficulty with RLE foot clearance on the step, improves with VC.   Circuit training x 2 rounds:  Standing hip flexion march with 4# weights x 15 alternating LEs Standing hip abduction with 4# weights x 15 each leg.  Standing hip extension with  4# weights x 15 each leg.  Comments: pt has the most difficulty with hip abduction exercise, rating as hard. Pt utilizing a rest interval between rounds.    Ambulation approx. 352 ft with 4# ankle weights, CGA and 2WW for muscular endurance and strength. Continued cues for sequencing/improved LE clearance/DF.   STS x 1 with emphasis on technique when fatigued. Pt able to complete with minimal cuing.  Pt educated throughout session about proper posture and technique with exercises. Improved exercise technique, movement at target joints, use of target muscles after min to mod verbal, visual, tactile cues.          PT Education - 04/08/21 1609  Education Details exercise technique    Person(s) Educated Patient    Methods Explanation;Demonstration;Verbal cues    Comprehension Verbalized understanding;Returned demonstration;Need further instruction              PT Short Term Goals - 04/02/21 1525       PT SHORT TERM GOAL #1   Title Patient will be adherent to HEP at least 3x a week to improve functional strength and balance for better safety at home.    Baseline 9/21: Pt reports compliance with HEP and is confident    Time 4    Period Weeks    Status Achieved    Target Date 12/31/20      PT SHORT TERM GOAL #2   Title Patient will be independent in transferring sit<>Stand without pushing on arm rests to improve ability to get up from chair.    Baseline 9/21: pt able to complete STS without use of UEs    Time 4    Period Weeks    Status Achieved    Target Date 12/31/20               PT Long Term Goals - 04/02/21 0836       PT LONG TERM GOAL #1   Title Patient (> 48 years old) will complete five times sit to stand test in < 15 seconds indicating an increased LE strength and improved balance.    Baseline 9/21: 29 seconds hands-free 10/10: deferred 10/12: 34 sec hands free; 11/2: 23.8 sec hands-free; 12/7: 33.2 sec hands free    Time 8    Period Weeks    Status  On-going    Target Date 04/27/21      PT LONG TERM GOAL #2   Title Patient will increase six minute walk test distance to >400 feet for improve gait ability    Baseline 9/21: 368 ft with RW; 10/10: deferred 10/12: 408 ft; 11/2: 476 ft with 4WW    Time 8    Period Weeks    Status Achieved    Target Date 04/27/21      PT LONG TERM GOAL #3   Title Patient will increase 10 meter walk test to >1.68ms as to improve gait speed for better community ambulation and to reduce fall risk.    Baseline 9/21: 0.5 m/s with RW; 10/10 deferred 10/12: 0.93 m/s with 46NG 11/2: 0.83 m/s with 4WW; 12/7: 0.52 m/s with RW    Time 8    Period Weeks    Status Partially Met    Target Date 04/27/21      PT LONG TERM GOAL #4   Title Patient will be independent with ascend/descend 6 steps using single UE in step over step pattern without LOB.    Baseline 9/21:  Patient uses PT stairs (4 steps) with bilateral upper extremity support on handrails for both ascending/descending.  Patient exhibits reciprocal pattern ascending and step to pattern descending. 10/10: deferred; 10/12: ascending/descending recip. steps with BUE support; 11/2: Ascended/descended 8 steps total, reciprocal pattern ascending and step-to descending. Pt uses UUE support throughout with CGA assist.; 12/7: Ascended and descended 8 steps total with UUE support and CGA. Reciprocal stepping ascending and step to descending.    Time 8    Period Weeks    Status Partially Met    Target Date 04/27/21      PT LONG TERM GOAL #5   Title Patient will demonstrate an improved Berg Balance Score of >45/56 as to demonstrate  improved balance with ADLs such as sitting/standing and transfer balance and reduced fall risk.    Baseline 9/21: deferred d/t time; 9/29: 42/56; 10/10: deferred; 10/12: 44/56; 11/2: deferred; 11/7: 46/56    Time 8    Period Weeks    Status Achieved    Target Date 04/27/21      PT LONG TERM GOAL #6   Title Patient will improve FOTO score to  >60% to indicate improved functional mobility with ADLs.    Baseline 9/21: 59; 10/10: 57; 11/2: 60; 12/7: 58%    Time 8    Period Weeks    Status Partially Met    Target Date 04/27/21                   Plan - 04/08/21 1610     Clinical Impression Statement Pt presents with great motivation today for all interventions. She is able to progress foot clearance exercise and is challenged with foot clearance on the 6" step with her RLE specifically. Pt displays improving exercise tolerance with standing exercises and excels with circuit training exercise for cardiorespiratory and muscular endurance. The pt will benefit from further skilled PT to improve strength, gait, endurance, and mobility    Personal Factors and Comorbidities Age    Comorbidities HTN, CKD, fall risk, type 2 diabetes,    Examination-Activity Limitations Lift;Locomotion Level;Squat;Stairs;Stand;Toileting;Transfers;Bathing    Examination-Participation Restrictions Cleaning;Community Activity;Laundry;Meal Prep;Shop;Volunteer;Driving    Stability/Clinical Decision Making Stable/Uncomplicated    Rehab Potential Poor    PT Frequency 2x / week    PT Duration 12 weeks    PT Treatment/Interventions Cryotherapy;Electrical Stimulation;Moist Heat;Gait training;Stair training;Functional mobility training;Therapeutic activities;Therapeutic exercise;Neuromuscular re-education;Balance training;Patient/family education;Orthotic Fit/Training;Energy conservation    PT Next Visit Plan work on LE strengthening and balance; gait training, endurance, continue POC as previously indicated    PT Home Exercise Plan Access Code: [email or web address]; no updates    Consulted and Agree with Plan of Care Patient             Patient will benefit from skilled therapeutic intervention in order to improve the following deficits and impairments:  Abnormal gait, Decreased balance, Decreased endurance, Decreased mobility, Difficulty walking, Cardiopulmonary  status limiting activity, Decreased activity tolerance, Decreased coordination, Decreased strength  Visit Diagnosis: Muscle weakness (generalized)  Unsteadiness on feet  Other abnormalities of gait and mobility     Problem List Patient Active Problem List   Diagnosis Date Noted   Carotid stenosis, symptomatic, with infarction ([place]) 11/13/2020   Gout flare 11/10/2020   UTI (urinary tract infection) 11/10/2020   Left carotid artery stenosis 11/10/2020   Chronic kidney disease 11/10/2020   Left basal ganglia embolic stroke ([place]) 07/15/2246   PAC (premature atrial contraction)    Pre-procedural cardiovascular examination    Ischemic stroke ([place]) 10/20/2020   TIA (transient ischemic attack) 10/19/2020   Right hemiparesis ([place]) 10/19/2020   Type 2 diabetes mellitus with stage 3 chronic kidney disease, without long-term current use of insulin ([place]) 08/24/2018   Hyperlipidemia, unspecified 07/19/2017   Hypertension 07/19/2017   PUD (peptic ulcer disease) 07/19/2017   Type 2 diabetes mellitus ([place]) 07/19/2017   Personal history of gout 08/12/2016   Anemia, unspecified 04/09/2016   Hypothyroidism, acquired 12/10/2015   Ricard Dillon PT, DPT This entire session was performed under direct supervision and direction of a licensed therapist/therapist assistant . I have personally read, edited and approve of the note as written.   Karn Cassis, SPT  04/08/2021, 4:24 PM  [place]  [place] MAIN [facility] SERVICES [place], [place], [address] Phone: [phone]   Fax:  [phone]  Name: Cassandra Schaefer MRN: [id number] Date of Birth: March 01, 1929

## 2021-04-09 NOTE — Therapy (Signed)
[place] [facility] MAIN [facility] SERVICES [address] [place], [place], [address] Phone: [phone]   Fax:  [phone]  Occupational Therapy Treatment  Patient Details  Name: Cassandra Schaefer MRN: [id number] Date of Birth: 04-08-1929 Referring Provider (OT): Dr. Barbette Reichmann   Encounter Date: 04/08/2021   OT End of Session - 04/09/21 1404     Visit Number 35    Number of Visits 40    Date for OT Re-Evaluation 04/21/21    Authorization Time Period Reporting period starting 02/16/2021    OT Start Time 1600    OT Stop Time 1645    OT Time Calculation (min) 45 min    Equipment Utilized During Treatment tranport chair    Activity Tolerance Patient tolerated treatment well    Behavior During Therapy WFL for tasks assessed/performed             Past Medical History:  Diagnosis Date   Anemia    Cough    RESOLVING, NO FEVER   Diabetes mellitus without complication (HCC)    Gout    Hypertension    Hypothyroidism    PUD (peptic ulcer disease)    Stroke [facility])    Wears dentures    full upper, partial lower    Past Surgical History:  Procedure Laterality Date   AMPUTATION TOE Left 12/14/2017   Procedure: AMPUTATION TOE-4TH MPJ;  Surgeon: Gwyneth Revels, DPM;  Location: [facility];  Service: Podiatry;  Laterality: Left;  IVA LOCAL Diabetic - oral meds   APPENDECTOMY     CATARACT EXTRACTION W/PHACO Right 06/23/2015   Procedure: CATARACT EXTRACTION PHACO AND INTRAOCULAR LENS PLACEMENT (IOC);  Surgeon: Sallee Lange, MD;  Location: [facility];  Service: Ophthalmology;  Laterality: Right;  [place] 01:28 AP% 23.4 CDE 36.14 fluid pack lot # [id number]   CATARACT EXTRACTION W/PHACO Left 07/28/2015   Procedure: CATARACT EXTRACTION PHACO AND INTRAOCULAR LENS PLACEMENT (IOC);  Surgeon: Sallee Lange, MD;  Location: [facility];  Service: Ophthalmology;  Laterality: Left;  [place]    1:29.7 AP%  24.9 CDE   41.32 fluid casette lot #[id number] H exp  09/23/2016   COONOSCOPY     AND ENDOSCOPY   DILATION AND CURETTAGE OF UTERUS     ENDARTERECTOMY Left 11/13/2020   Procedure: Evacuation of left neck hematoma;  Surgeon: Renford Dills, MD;  Location: [facility];  Service: Vascular;  Laterality: Left;   ENDARTERECTOMY Left 11/13/2020   Procedure: ENDARTERECTOMY CAROTID;  Surgeon: Annice Needy, MD;  Location: [facility];  Service: Vascular;  Laterality: Left;   TONSILLECTOMY     TUBAL LIGATION      There were no vitals filed for this visit.   Subjective Assessment - 04/08/21 1403     Subjective  "I'm hoping to play dominoes tomorrow."    Patient is accompanied by: Family member    Pertinent History R ankle pain, gout, T2DM, HTN, CKDIII; R/L carotid endarectomy    Patient Stated Goals "I want to improve my aim when I'm using my R arm."    Currently in Pain? No/denies    Pain Score 0-No pain    Pain Onset In the past 7 days            Occupational Therapy Treatment: Therapeutic Exercise: Facilitated grip strengthening with use of hand gripper set at min resistance with 1 red band to complete 5 sets 10 reps of squeezes each hand.  Rest between sets.   Neuro re-ed: Participation in RUE reaching  toward a target, picking up narrow 2" sticks and sliding them through small hole for target with fair accuracy.  Used R hand to place magnets on target shoulder level with good accuracy.  Practiced object manipulation/small item pick up, with manipulation of 2" sticks, picking sticks up from table, storing in hand, and discarding 1 at a time.    Response to Treatment: See Plan/clinical impression below.    OT Education - 04/08/21 1404     Education Details RUE GMC/FMC exercises    Person(s) Educated Patient    Methods Explanation;Verbal cues;Demonstration;Tactile cues    Comprehension Verbalized understanding;Verbal cues required;Returned demonstration              OT Short Term Goals - 03/23/21 1657       OT SHORT TERM GOAL #1    Title Pt will perform HEP for RUE strength/coordination independently.    Baseline Eval: HEP not yet established in outpatient, but pt is working with putty from [name]; 12/04/2020; [facility] HEP initiated this day with mod vc after return demo; 01/05/2021: indep with currently program, but ongoing as pt progresses; 01/28/2021: vc to revisit and focus on grip and pinch strengthening with putty; 03/23/21: pt is indep with HEP    Time 6    Period Weeks    Status Achieved    Target Date 03/23/21               OT Long Term Goals - 03/23/21 1659       OT LONG TERM GOAL #1   Title Pt will improve hand writing to 100% legibility with R hand to be able to independently write a check.    Baseline Eval: signature is 75% legible with R hand, requiring extra time; 12/04/2020: no change from eval; 01/05/2021: Printing is 100% legible, signature is 90% legible, but still requires extra time and effort.; 01/26/21: Signature is 90% legible and speed/fluidity have improved. 20th visit: 90% legible, 03/23/21: 90% legible, extra time    Time 12    Period Weeks    Status On-going    Target Date 04/21/21      OT LONG TERM GOAL #2   Title Pt will improve R hand coordination to enable good manipulation of iphone using R dominant hand    Baseline Eval: Pt uses L hand d/t R hand lacking sufficient [place] to press buttons on phone; 12/04/2020: no change from eval; 01/05/2021: R hand coordination is improving but pt still uses L hand to dial phone; 01/28/21: Pt using R dominant hand to press buttons and apps on phone 50% of the time.20th visit: Right hand [facility] skilols are improving, Pt. is improving with manipulation of the iphone; 03/23/21: Pt now consistently using R hand to press buttons on phone with good accuracy.    Time 12    Period Weeks    Status Achieved    Target Date 04/21/21      OT LONG TERM GOAL #3   Title Pt will improve GMC throughout RUE to enable pt to reach for and pick up ADL supplies with R dominant hand  without dropping or knocking over objects.    Baseline Eval: Pt reports RUE is clumsy and easily knocks over objects when trying to reach for ADL supplies.  Pt verbalizes that her "aim is off."; 12/04/2020: pt reports slight improvement since eval and has started using her R hand to eat, but still feels clumsy; 01/05/2021: Greatly improved; pt is using silverware to eat with R  hand, can comb hair with RUE, still mildly ataxic; 01/26/21: pt reports difficulty picking up objects within a small space (ie; may knock the toothpaste holder down when reaching for toothbrush), but reaching for light/larger objects is improving 20th visit: pt. continues to be mildly ataxic, however is consistently improving with motor control, and [place] while reaching targets; 03/23/21: Pt reaches with accuracy towards targets if she takes her time (pt drops and knocks things over when moving at a faster/normal pace)    Time 12    Period Weeks    Status On-going    Target Date 04/21/21      OT LONG TERM GOAL #4   Title Pt will improve FOTO score to 68 or better to indicate measurable functional improvement.    Baseline Eval: FOTO 55; 01/05/2021: FOTO 61; 01/28/21: FOTO 61, 20th visit: 61; 03/23/21: FOTO 60    Time 12    Period Weeks    Status On-going    Target Date 04/21/21      OT LONG TERM GOAL #5   Title Pt will perform dynamic standing tasks with modified indep, AD as needed in order to reduce fall risk with ADLs    Baseline Eval: CGA-min A for all dynamic standing tasks using RW; 12/04/2020: pt is using RW in home with distant supv, but family still provides close supv on stairs; 01/05/2021: Pt now using RW to amb in and around the house, can perform ADLs with RW and modified indep, and can manage stairs at home with modified indep.  No falls reported.    Time 12    Period Weeks    Status Achieved    Target Date 02/01/21              Plan - 04/08/21 1421     Clinical Impression Statement Pt reports she was able to  sign her name well on all of her Christmas Cards.  Pt still struggles with small object manipulation in R hand and reaching toward a target with precision.  R hand weakness, mild ataxia, and mild apraxia still present, which contribute to extra time and caution needed to engage RUE into self care tasks.  Pt will continue to benefit from skilled OT for increasing strength and coordination in R hand for improved use of R dominant arm with self care and leisure activities.    OT Occupational Profile and History Detailed Assessment- Review of Records and additional review of physical, cognitive, psychosocial history related to current functional performance    Occupational performance deficits (Please refer to evaluation for details): ADL's;Leisure;IADL's    Body Structure / Function / Physical Skills ADL;Coordination;Endurance;GMC;UE functional use;Balance;Sensation;Body mechanics;IADL;Pain;Dexterity;FMC;Strength;Gait;Mobility    Rehab Potential Good    Clinical Decision Making Several treatment options, min-mod task modification necessary    Comorbidities Affecting Occupational Performance: May have comorbidities impacting occupational performance    Modification or Assistance to Complete Evaluation  Min-Moderate modification of tasks or assist with assess necessary to complete eval    OT Frequency 2x / week    OT Duration 12 weeks    OT Treatment/Interventions Self-care/ADL training;Therapeutic exercise;DME and/or AE instruction;Functional Mobility Training;Balance training;Neuromuscular education;Therapeutic activities;Patient/family education    Consulted and Agree with Plan of Care Patient             Patient will benefit from skilled therapeutic intervention in order to improve the following deficits and impairments:   Body Structure / Function / Physical Skills: ADL, Coordination, Endurance, GMC, UE functional use, Balance, Sensation,  Body mechanics, IADL, Pain, Dexterity, FMC, Strength,  Gait, Mobility       Visit Diagnosis: Muscle weakness (generalized)  Other lack of coordination    Problem List Patient Active Problem List   Diagnosis Date Noted   Carotid stenosis, symptomatic, with infarction ([place]) 11/13/2020   Gout flare 11/10/2020   UTI (urinary tract infection) 11/10/2020   Left carotid artery stenosis 11/10/2020   Chronic kidney disease 11/10/2020   Left basal ganglia embolic stroke ([place]) [id number]   PAC (premature atrial contraction)    Pre-procedural cardiovascular examination    Ischemic stroke ([place]) 10/20/2020   TIA (transient ischemic attack) 10/19/2020   Right hemiparesis ([place]) 10/19/2020   Type 2 diabetes mellitus with stage 3 chronic kidney disease, without long-term current use of insulin ([place]) 08/24/2018   Hyperlipidemia, unspecified 07/19/2017   Hypertension 07/19/2017   PUD (peptic ulcer disease) 07/19/2017   Type 2 diabetes mellitus ([place]) 07/19/2017   Personal history of gout 08/12/2016   Anemia, unspecified 04/09/2016   Hypothyroidism, acquired 12/10/2015   Leta Speller, MS, OTR/L  Darleene Cleaver, OT 04/09/2021, 2:21 PM  [place] MAIN [facility] SERVICES [address] [place], [place], [address] Phone: [phone]   Fax:  [phone]  Name: Ewelina Arciero MRN: [id number] Date of Birth: 08/17/1928

## 2021-04-13 ENCOUNTER — Other Ambulatory Visit: Payer: Self-pay

## 2021-04-13 ENCOUNTER — Ambulatory Visit: Payer: Medicare HMO

## 2021-04-13 ENCOUNTER — Encounter: Payer: Medicare HMO | Admitting: Speech Pathology

## 2021-04-13 DIAGNOSIS — M6281 Muscle weakness (generalized): Secondary | ICD-10-CM | POA: Diagnosis not present

## 2021-04-13 DIAGNOSIS — R278 Other lack of coordination: Secondary | ICD-10-CM

## 2021-04-13 DIAGNOSIS — R2689 Other abnormalities of gait and mobility: Secondary | ICD-10-CM

## 2021-04-13 DIAGNOSIS — R2681 Unsteadiness on feet: Secondary | ICD-10-CM

## 2021-04-13 NOTE — Therapy (Signed)
[place] MAIN [facility] [address] [place], [place], [address] Phone: [phone]   Fax:  [phone]  Physical Therapy Treatment  Patient Details  Name: Cassandra Schaefer MRN: [id number] Date of Birth: 24-Oct-1928 Referring Provider (PT): Dr. Dew/Dr. Ginette Pitman PCP   Encounter Date: 04/13/2021   PT End of Session - 04/13/21 1523     Visit Number 33    Number of Visits 38    Date for PT Re-Evaluation 04/27/21    Authorization Type [employer]    Authorization Time Period 02/02/21-04/27/21    Progress Note Due on Visit 30    PT Start Time 1433    PT Stop Time 1515    PT Time Calculation (min) 42 min    Equipment Utilized During Treatment Gait belt    Activity Tolerance Patient tolerated treatment well    Behavior During Therapy WFL for tasks assessed/performed             Past Medical History:  Diagnosis Date   Anemia    Cough    RESOLVING, NO FEVER   Diabetes mellitus without complication ([place])    Gout    Hypertension    Hypothyroidism    PUD (peptic ulcer disease)    Stroke [facility])    Wears dentures    full upper, partial lower    Past Surgical History:  Procedure Laterality Date   AMPUTATION TOE Left 12/14/2017   Procedure: AMPUTATION TOE-4TH MPJ;  Surgeon: Samara Deist, DPM;  Location: [place];  Service: Podiatry;  Laterality: Left;  IVA LOCAL Diabetic - oral meds   APPENDECTOMY     CATARACT EXTRACTION W/PHACO Right 06/23/2015   Procedure: CATARACT EXTRACTION PHACO AND INTRAOCULAR LENS PLACEMENT (IOC);  Surgeon: Estill Cotta, MD;  Location: [facility];  Service: Ophthalmology;  Laterality: Right;  [place] 01:28 AP% 23.4 CDE 36.14 fluid pack lot # [id number]   CATARACT EXTRACTION W/PHACO Left 07/28/2015   Procedure: CATARACT EXTRACTION PHACO AND INTRAOCULAR LENS PLACEMENT (IOC);  Surgeon: Estill Cotta, MD;  Location: [facility];  Service: Ophthalmology;  Laterality: Left;  [place]    1:29.7 AP%  24.9 CDE    41.32 fluid casette lot #[id number] H exp 09/23/2016   COONOSCOPY     AND ENDOSCOPY   DILATION AND CURETTAGE OF UTERUS     ENDARTERECTOMY Left 11/13/2020   Procedure: Evacuation of left neck hematoma;  Surgeon: Katha Cabal, MD;  Location: [facility];  Service: Vascular;  Laterality: Left;   ENDARTERECTOMY Left 11/13/2020   Procedure: ENDARTERECTOMY CAROTID;  Surgeon: Algernon Huxley, MD;  Location: [facility];  Service: Vascular;  Laterality: Left;   TONSILLECTOMY     TUBAL LIGATION      There were no vitals filed for this visit.   Subjective Assessment - 04/13/21 1522     Subjective Patient reports no stumbles or falls.  She reports she has no pain currently.  The patient states she was able to maintain her balance at her sink while brushing her teeth.  She reports she also had her walker next to her just in case.    Patient is accompained by: --   Daughter, Cassandra Schaefer   Pertinent History 87 y.o. female with medical history significant for type 2 diabetes mellitus, essential hypertension, acquired hypothyroidism, hyperlipidemia, stage III chronic kidney disease reports increased right sided weakness on 10/19/20. She was diagnosed with left basal ganglia ischemic stroke. She was discharged to inpatient rehab for 10 days and then discharged home.  Patient was evaluated by outpatient PT on 11/12/20. CVA was due to carotid stenosis. She underwent left carotid endartectomy on 7/21. Procedure went well however patient suffered from left cervical hematoma and had subsequent surgical procedure to stop the bleed and remove the hematoma on 11/13/20. They were concerned with her breathing and therefore intubated her for 1 day, she was in ICU for 2 days and transferred to med/surge on 11/15/20. Patient was discharged home on 11/17/20 and is now returning to outpatient PT. She has had the stiches removed and is healing well. Denies any neck pain or stiffness. She lives with her daughter and has 24/7 caregiver support. She is  still using RW for most ambulation. She is able to transfer to bedside commode well and is able to stand short time unsupported. She reports feeling very weak and fatigued. She reports she has been dragging her right foot more. She denies any numbness/tingling; She reports feeling like her legs are wanting to do more but is hesitant because of fear of falling. She is mod I for self care morning routine. She does still receive help with showering. She has been working on increasing her activity but denies any formal exercise.    Limitations Standing;Walking    How long can you sit comfortably? NA    How long can you stand comfortably? 30-60 min with support    How long can you walk comfortably? about 100 feet with RW, still limited to short distances;    Diagnostic tests MRI shows left basal ganglia infarct 10/21/20    Patient Stated Goals "Improve balance and walking and not need RW, get back to independent."    Currently in Pain? No/denies    Pain Onset In the past 7 days             INTERVENTIONS  STS 10x hands-free with no warm-up attempt needed. Cuing for upright posture.  Ambulation with SPC and close CGA with focus on dynamic balance and secondary focus on endurance - 2x148 ft, only two instances of taking a step (increased distractions in environment) due to decreased postural stability.  PT also instructed patient and sequencing with cane.  Patient did attempt with using cane in both upper extremities.  Patient showed improved stability with cane in right upper extremity.  Alternating toe taps onto 6" step 4# ankle weights donned 2x12; pt rates medium. Pt has difficulty with RLE foot clearance able to self-correct.  Stairs with 4# ankle weights donned - 2x ascending/descending.  Patient exhibits reciprocal pattern ascending and step to pattern descending.  The patient uses bilateral upper extremity support on handrails.  Nustep HIIT. Pt maintained SPM: 60s-70s Level 1 for 1 minute   Level 4 for 30 sec  Level 1 for 1 minute Level 4 for 30 sec  Level 1 for 1 minute  Level 4 for 30 sec  Level 1 for 1 minute 30 sec  Comments: Distance travelled - 0.21 miles, followed by rest interval Close CGA-min a for mount/dismount. Pt monitored for exercise response throughout.  Patient reports no pain or discomfort with intervention.   Pt educated throughout session about proper posture and technique with exercises. Improved exercise technique, movement at target joints, use of target muscles after min to mod verbal, visual, tactile cues.    Note: Portions of this document were prepared using Dragon voice recognition software and although reviewed may contain unintentional dictation errors in syntax, grammar, or spelling.     PT Education - 04/13/21 1523  Education Details Technique with single-point cane, body mechanics    Person(s) Educated Patient    Methods Explanation;Demonstration;Tactile cues;Verbal cues    Comprehension Verbalized understanding;Returned demonstration;Verbal cues required;Need further instruction              PT Short Term Goals - 04/02/21 1525       PT SHORT TERM GOAL #1   Title Patient will be adherent to HEP at least 3x a week to improve functional strength and balance for better safety at home.    Baseline 9/21: Pt reports compliance with HEP and is confident    Time 4    Period Weeks    Status Achieved    Target Date 12/31/20      PT SHORT TERM GOAL #2   Title Patient will be independent in transferring sit<>Stand without pushing on arm rests to improve ability to get up from chair.    Baseline 9/21: pt able to complete STS without use of UEs    Time 4    Period Weeks    Status Achieved    Target Date 12/31/20               PT Long Term Goals - 04/02/21 0836       PT LONG TERM GOAL #1   Title Patient (> 1 years old) will complete five times sit to stand test in < 15 seconds indicating an increased LE strength and  improved balance.    Baseline 9/21: 29 seconds hands-free 10/10: deferred 10/12: 34 sec hands free; 11/2: 23.8 sec hands-free; 12/7: 33.2 sec hands free    Time 8    Period Weeks    Status On-going    Target Date 04/27/21      PT LONG TERM GOAL #2   Title Patient will increase six minute walk test distance to >400 feet for improve gait ability    Baseline 9/21: 368 ft with RW; 10/10: deferred 10/12: 408 ft; 11/2: 476 ft with 4WW    Time 8    Period Weeks    Status Achieved    Target Date 04/27/21      PT LONG TERM GOAL #3   Title Patient will increase 10 meter walk test to >1.43ms as to improve gait speed for better community ambulation and to reduce fall risk.    Baseline 9/21: 0.5 m/s with RW; 10/10 deferred 10/12: 0.93 m/s with 41DQ 11/2: 0.83 m/s with 4WW; 12/7: 0.52 m/s with RW    Time 8    Period Weeks    Status Partially Met    Target Date 04/27/21      PT LONG TERM GOAL #4   Title Patient will be independent with ascend/descend 6 steps using single UE in step over step pattern without LOB.    Baseline 9/21:  Patient uses PT stairs (4 steps) with bilateral upper extremity support on handrails for both ascending/descending.  Patient exhibits reciprocal pattern ascending and step to pattern descending. 10/10: deferred; 10/12: ascending/descending recip. steps with BUE support; 11/2: Ascended/descended 8 steps total, reciprocal pattern ascending and step-to descending. Pt uses UUE support throughout with CGA assist.; 12/7: Ascended and descended 8 steps total with UUE support and CGA. Reciprocal stepping ascending and step to descending.    Time 8    Period Weeks    Status Partially Met    Target Date 04/27/21      PT LONG TERM GOAL #5   Title Patient will demonstrate an improved BMerrilee Jansky  Balance Score of >45/56 as to demonstrate improved balance with ADLs such as sitting/standing and transfer balance and reduced fall risk.    Baseline 9/21: deferred d/t time; 9/29: 42/56; 10/10:  deferred; 10/12: 44/56; 11/2: deferred; 11/7: 46/56    Time 8    Period Weeks    Status Achieved    Target Date 04/27/21      PT LONG TERM GOAL #6   Title Patient will improve FOTO score to >60% to indicate improved functional mobility with ADLs.    Baseline 9/21: 59; 10/10: 57; 11/2: 60; 12/7: 58%    Time 8    Period Weeks    Status Partially Met    Target Date 04/27/21                   Plan - 04/13/21 1524     Clinical Impression Statement Patient highly motivated to participate in session.  Patient response to all interventions well without increased pain or excessive fatigue.  The patient was able to trial a single-point cane with gait, and tried it with both right upper extremity holding cane in left upper extremity holding cane.  Patient actually preferred and exhibited improved stability with cane in right hand.  PT did advise patient not to attempt this at home and to continue using her rolling walker at home.  The patient verbalized understanding.  The patient will benefit from further skilled PT to improve strength, gait, endurance, and mobility in order to increase ease and safety with ADLs.    Personal Factors and Comorbidities Age    Comorbidities HTN, CKD, fall risk, type 2 diabetes,    Examination-Activity Limitations Lift;Locomotion Level;Squat;Stairs;Stand;Toileting;Transfers;Bathing    Examination-Participation Restrictions Cleaning;Community Activity;Laundry;Meal Prep;Shop;Volunteer;Driving    Stability/Clinical Decision Making Stable/Uncomplicated    Rehab Potential Poor    PT Frequency 2x / week    PT Duration 12 weeks    PT Treatment/Interventions Cryotherapy;Electrical Stimulation;Moist Heat;Gait training;Stair training;Functional mobility training;Therapeutic activities;Therapeutic exercise;Neuromuscular re-education;Balance training;Patient/family education;Orthotic Fit/Training;Energy conservation    PT Next Visit Plan work on LE strengthening and  balance; gait training, endurance, continue POC as previously indicated    PT Home Exercise Plan Access Code: [email or web address]; no updates    Consulted and Agree with Plan of Care Patient             Patient will benefit from skilled therapeutic intervention in order to improve the following deficits and impairments:  Abnormal gait, Decreased balance, Decreased endurance, Decreased mobility, Difficulty walking, Cardiopulmonary status limiting activity, Decreased activity tolerance, Decreased coordination, Decreased strength  Visit Diagnosis: Muscle weakness (generalized)  Other abnormalities of gait and mobility  Other lack of coordination  Unsteadiness on feet     Problem List Patient Active Problem List   Diagnosis Date Noted   Carotid stenosis, symptomatic, with infarction ([place]) 11/13/2020   Gout flare 11/10/2020   UTI (urinary tract infection) 11/10/2020   Left carotid artery stenosis 11/10/2020   Chronic kidney disease 11/10/2020   Left basal ganglia embolic stroke ([place]) 44/97/5300   PAC (premature atrial contraction)    Pre-procedural cardiovascular examination    Ischemic stroke ([place]) 10/20/2020   TIA (transient ischemic attack) 10/19/2020   Right hemiparesis ([place]) 10/19/2020   Type 2 diabetes mellitus with stage 3 chronic kidney disease, without long-term current use of insulin ([place]) 08/24/2018   Hyperlipidemia, unspecified 07/19/2017   Hypertension 07/19/2017   PUD (peptic ulcer disease) 07/19/2017   Type 2 diabetes mellitus ([place]) 07/19/2017   Personal history of gout  08/12/2016   Anemia, unspecified 04/09/2016   Hypothyroidism, acquired 12/10/2015    Zollie Pee, PT 04/13/2021, 3:29 PM  [place] MAIN [facility] [address] [place], [place], [address] Phone: [phone]   Fax:  [phone]  Name: Cassandra Schaefer MRN: [id number] Date of Birth: Sep 10, 1928

## 2021-04-14 NOTE — Therapy (Signed)
[place] MAIN [facility] SERVICES [address] [place], [place], [address] Phone: [phone]   Fax:  [phone]  Occupational Therapy Treatment  Patient Details  Name: Cassandra Schaefer MRN: [id number] Date of Birth: 1928-12-10 Referring Provider (OT): Dr. Tracie Harrier   Encounter Date: 04/13/2021   OT End of Session - 04/14/21 0856     Visit Number 36    Number of Visits 40    Date for OT Re-Evaluation 04/21/21    Authorization Time Period Reporting period starting 02/16/2021    OT Start Time 1515    OT Stop Time 1600    OT Time Calculation (min) 45 min    Equipment Utilized During Treatment tranport chair    Activity Tolerance Patient tolerated treatment well    Behavior During Therapy WFL for tasks assessed/performed             Past Medical History:  Diagnosis Date   Anemia    Cough    RESOLVING, NO FEVER   Diabetes mellitus without complication ([place])    Gout    Hypertension    Hypothyroidism    PUD (peptic ulcer disease)    Stroke [facility])    Wears dentures    full upper, partial lower    Past Surgical History:  Procedure Laterality Date   AMPUTATION TOE Left 12/14/2017   Procedure: AMPUTATION TOE-4TH MPJ;  Surgeon: Samara Deist, DPM;  Location: [place];  Service: Podiatry;  Laterality: Left;  IVA LOCAL Diabetic - oral meds   APPENDECTOMY     CATARACT EXTRACTION W/PHACO Right 06/23/2015   Procedure: CATARACT EXTRACTION PHACO AND INTRAOCULAR LENS PLACEMENT (IOC);  Surgeon: Estill Cotta, MD;  Location: [facility];  Service: Ophthalmology;  Laterality: Right;  [place] 01:28 AP% 23.4 CDE 36.14 fluid pack lot # [id number]   CATARACT EXTRACTION W/PHACO Left 07/28/2015   Procedure: CATARACT EXTRACTION PHACO AND INTRAOCULAR LENS PLACEMENT (IOC);  Surgeon: Estill Cotta, MD;  Location: [facility];  Service: Ophthalmology;  Laterality: Left;  [place]    1:29.7 AP%  24.9 CDE   41.32 fluid casette lot [id number] H exp  09/23/2016   COONOSCOPY     AND ENDOSCOPY   DILATION AND CURETTAGE OF UTERUS     ENDARTERECTOMY Left 11/13/2020   Procedure: Evacuation of left neck hematoma;  Surgeon: Katha Cabal, MD;  Location: [facility];  Service: Vascular;  Laterality: Left;   ENDARTERECTOMY Left 11/13/2020   Procedure: ENDARTERECTOMY CAROTID;  Surgeon: Algernon Huxley, MD;  Location: [facility];  Service: Vascular;  Laterality: Left;   TONSILLECTOMY     TUBAL LIGATION      There were no vitals filed for this visit.   Subjective Assessment - 04/13/21 0853     Subjective  "I can tell my hand is getting better in the way I use it every day."    Patient is accompanied by: Family member    Pertinent History R ankle pain, gout, T2DM, HTN, CKDIII; R/L carotid endarectomy    Patient Stated Goals "I want to improve my aim when I'm using my R arm."    Currently in Pain? No/denies    Pain Score 0-No pain    Pain Onset In the past 7 days            Occupational Therapy Treatment: Therapeutic Exercise: Facilitated grip strengthening with use of hand gripper set at min resistance with 1 red band for 5 sets 10 reps R/L hands.  Sets completed throughout session  to allow rest breaks between.   Neuro re-ed: Participation in playing card manipulation activities with shuffling, dealing, flipping cards with focus on speed using R hand.  Practiced in hand manipulation skills with small glass beads, picking up 1 at a time and 4 at a time, storing in hand, and discarding 1 at a time; able to complete with fairly good accuracy with R hand, but extra time is needed to avoid dropping and moving stones from palm to fingertips.  Practiced reaching toward target with RUE with pt placing thin 2" sticks and pushing through small peg board.  Alternated placement of pegboard to facilitate forward reaching and lateral reaching with RUE.   Response to Treatment: See Plan/clinical impression below.    OT Education - 04/13/21 0855      Education Details RUE GMC/FMC exercises    Person(s) Educated Patient    Methods Explanation;Verbal cues;Demonstration;Tactile cues    Comprehension Verbalized understanding;Verbal cues required;Returned demonstration              OT Short Term Goals - 03/23/21 1657       OT SHORT TERM GOAL #1   Title Pt will perform HEP for RUE strength/coordination independently.    Baseline Eval: HEP not yet established in outpatient, but pt is working with putty from [name]; 12/04/2020; [facility] HEP initiated this day with mod vc after return demo; 01/05/2021: indep with currently program, but ongoing as pt progresses; 01/28/2021: vc to revisit and focus on grip and pinch strengthening with putty; 03/23/21: pt is indep with HEP    Time 6    Period Weeks    Status Achieved    Target Date 03/23/21               OT Long Term Goals - 03/23/21 1659       OT LONG TERM GOAL #1   Title Pt will improve hand writing to 100% legibility with R hand to be able to independently write a check.    Baseline Eval: signature is 75% legible with R hand, requiring extra time; 12/04/2020: no change from eval; 01/05/2021: Printing is 100% legible, signature is 90% legible, but still requires extra time and effort.; 01/26/21: Signature is 90% legible and speed/fluidity have improved. 20th visit: 90% legible, 03/23/21: 90% legible, extra time    Time 12    Period Weeks    Status On-going    Target Date 04/21/21      OT LONG TERM GOAL #2   Title Pt will improve R hand coordination to enable good manipulation of iphone using R dominant hand    Baseline Eval: Pt uses L hand d/t R hand lacking sufficient [place] to press buttons on phone; 12/04/2020: no change from eval; 01/05/2021: R hand coordination is improving but pt still uses L hand to dial phone; 01/28/21: Pt using R dominant hand to press buttons and apps on phone 50% of the time.20th visit: Right hand [facility] skilols are improving, Pt. is improving with manipulation of the iphone;  03/23/21: Pt now consistently using R hand to press buttons on phone with good accuracy.    Time 12    Period Weeks    Status Achieved    Target Date 04/21/21      OT LONG TERM GOAL #3   Title Pt will improve GMC throughout RUE to enable pt to reach for and pick up ADL supplies with R dominant hand without dropping or knocking over objects.    Baseline Eval: Pt  reports RUE is clumsy and easily knocks over objects when trying to reach for ADL supplies.  Pt verbalizes that her "aim is off."; 12/04/2020: pt reports slight improvement since eval and has started using her R hand to eat, but still feels clumsy; 01/05/2021: Greatly improved; pt is using silverware to eat with R hand, can comb hair with RUE, still mildly ataxic; 01/26/21: pt reports difficulty picking up objects within a small space (ie; may knock the toothpaste holder down when reaching for toothbrush), but reaching for light/larger objects is improving 20th visit: pt. continues to be mildly ataxic, however is consistently improving with motor control, and Cataract while reaching targets; 03/23/21: Pt reaches with accuracy towards targets if she takes her time (pt drops and knocks things over when moving at a faster/normal pace)    Time 12    Period Weeks    Status On-going    Target Date 04/21/21      OT LONG TERM GOAL #4   Title Pt will improve FOTO score to 68 or better to indicate measurable functional improvement.    Baseline Eval: FOTO 55; 01/05/2021: FOTO 61; 01/28/21: FOTO 61, 20th visit: 61; 03/23/21: FOTO 60    Time 12    Period Weeks    Status On-going    Target Date 04/21/21      OT LONG TERM GOAL #5   Title Pt will perform dynamic standing tasks with modified indep, AD as needed in order to reduce fall risk with ADLs    Baseline Eval: CGA-min A for all dynamic standing tasks using RW; 12/04/2020: pt is using RW in home with distant supv, but family still provides close supv on stairs; 01/05/2021: Pt now using RW to amb in and  around the house, can perform ADLs with RW and modified indep, and can manage stairs at home with modified indep.  No falls reported.    Time 12    Period Weeks    Status Achieved    Target Date 02/01/21              Plan - 04/13/21 0906     Clinical Impression Statement Pt continues to develop strength and coordination skills in R dominant arm.  Pt reported that she continues to note improvement in the way she can use her hand with functional activities, and was pleased with her ability to use R arm during her last dominoes game with friends.  Pt continues to work towards increasing speed and accuracy with R in hand manipulation skills, and continues to work towards improving control of RUE when reaching toward a target.  Pt will continue to benefit from skilled OT to address above noted skills for maximizing accuracy and efficiency with functional use of R dominant arm.    OT Occupational Profile and History Detailed Assessment- Review of Records and additional review of physical, cognitive, psychosocial history related to current functional performance    Occupational performance deficits (Please refer to evaluation for details): ADL's;Leisure;IADL's    Body Structure / Function / Physical Skills ADL;Coordination;Endurance;GMC;UE functional use;Balance;Sensation;Body mechanics;IADL;Pain;Dexterity;FMC;Strength;Gait;Mobility    Rehab Potential Good    Clinical Decision Making Several treatment options, min-mod task modification necessary    Comorbidities Affecting Occupational Performance: May have comorbidities impacting occupational performance    Modification or Assistance to Complete Evaluation  Min-Moderate modification of tasks or assist with assess necessary to complete eval    OT Frequency 2x / week    OT Duration 12 weeks    OT  Treatment/Interventions Self-care/ADL training;Therapeutic exercise;DME and/or AE instruction;Functional Mobility Training;Balance training;Neuromuscular  education;Therapeutic activities;Patient/family education    Consulted and Agree with Plan of Care Patient             Patient will benefit from skilled therapeutic intervention in order to improve the following deficits and impairments:   Body Structure / Function / Physical Skills: ADL, Coordination, Endurance, GMC, UE functional use, Balance, Sensation, Body mechanics, IADL, Pain, Dexterity, FMC, Strength, Gait, Mobility       Visit Diagnosis: Muscle weakness (generalized)  Other lack of coordination    Problem List Patient Active Problem List   Diagnosis Date Noted   Carotid stenosis, symptomatic, with infarction (HCC) 11/13/2020   Gout flare 11/10/2020   UTI (urinary tract infection) 11/10/2020   Left carotid artery stenosis 11/10/2020   Chronic kidney disease 11/10/2020   Left basal ganglia embolic stroke (HCC) 10/24/2020   PAC (premature atrial contraction)    Pre-procedural cardiovascular examination    Ischemic stroke (HCC) 10/20/2020   TIA (transient ischemic attack) 10/19/2020   Right hemiparesis (HCC) 10/19/2020   Type 2 diabetes mellitus with stage 3 chronic kidney disease, without long-term current use of insulin (HCC) 08/24/2018   Hyperlipidemia, unspecified 07/19/2017   Hypertension 07/19/2017   PUD (peptic ulcer disease) 07/19/2017   Type 2 diabetes mellitus (HCC) 07/19/2017   Personal history of gout 08/12/2016   Anemia, unspecified 04/09/2016   Hypothyroidism, acquired 12/10/2015   Danelle Earthly, MS, OTR/L  [employer], OT 04/14/2021, 9:06 AM  [place] [facility] MAIN [facility] SERVICES [address] [place], [place], [address] Phone: [phone]   Fax:  [phone]  Name: Marylou Wages MRN: [id number] Date of Birth: [birth year]

## 2021-04-15 ENCOUNTER — Other Ambulatory Visit: Payer: Self-pay

## 2021-04-15 ENCOUNTER — Ambulatory Visit: Payer: Medicare HMO

## 2021-04-15 ENCOUNTER — Encounter: Payer: Medicare HMO | Admitting: Speech Pathology

## 2021-04-15 DIAGNOSIS — R278 Other lack of coordination: Secondary | ICD-10-CM

## 2021-04-15 DIAGNOSIS — M6281 Muscle weakness (generalized): Secondary | ICD-10-CM | POA: Diagnosis not present

## 2021-04-15 DIAGNOSIS — R2689 Other abnormalities of gait and mobility: Secondary | ICD-10-CM

## 2021-04-15 DIAGNOSIS — R2681 Unsteadiness on feet: Secondary | ICD-10-CM

## 2021-04-15 DIAGNOSIS — R482 Apraxia: Secondary | ICD-10-CM

## 2021-04-15 NOTE — Therapy (Signed)
[place] MAIN [facility] SERVICES [address] [place], [place], [address] Phone: [phone]   Fax:  [phone]  Physical Therapy Treatment  Patient Details  Name: Cassandra Schaefer MRN: [id number] Date of Birth: [birth year] Referring Provider (PT): Dr. Dew/Dr. Ginette Pitman PCP   Encounter Date: 04/15/2021   PT End of Session - 04/15/21 1528     Visit Number 34    Number of Visits 38    Date for PT Re-Evaluation 04/27/21    Authorization Type [employer]    Authorization Time Period 02/02/21-04/27/21    Progress Note Due on Visit 22    PT Start Time 1431    PT Stop Time 1514    PT Time Calculation (min) 43 min    Equipment Utilized During Treatment Gait belt    Activity Tolerance Patient tolerated treatment well    Behavior During Therapy WFL for tasks assessed/performed             Past Medical History:  Diagnosis Date   Anemia    Cough    RESOLVING, NO FEVER   Diabetes mellitus without complication ([place])    Gout    Hypertension    Hypothyroidism    PUD (peptic ulcer disease)    Stroke [facility])    Wears dentures    full upper, partial lower    Past Surgical History:  Procedure Laterality Date   AMPUTATION TOE Left 12/14/2017   Procedure: AMPUTATION TOE-4TH MPJ;  Surgeon: Samara Deist, DPM;  Location: [place];  Service: Podiatry;  Laterality: Left;  IVA LOCAL Diabetic - oral meds   APPENDECTOMY     CATARACT EXTRACTION W/PHACO Right 06/23/2015   Procedure: CATARACT EXTRACTION PHACO AND INTRAOCULAR LENS PLACEMENT (IOC);  Surgeon: Estill Cotta, MD;  Location: [facility];  Service: Ophthalmology;  Laterality: Right;  [place] 01:28 AP% 23.4 CDE 36.14 fluid pack lot # [id number]   CATARACT EXTRACTION W/PHACO Left 07/28/2015   Procedure: CATARACT EXTRACTION PHACO AND INTRAOCULAR LENS PLACEMENT (IOC);  Surgeon: Estill Cotta, MD;  Location: [facility];  Service: Ophthalmology;  Laterality: Left;  [place]    1:29.7 AP%  24.9 CDE    41.32 fluid casette lot #[id number] H exp 09/23/2016   COONOSCOPY     AND ENDOSCOPY   DILATION AND CURETTAGE OF UTERUS     ENDARTERECTOMY Left 11/13/2020   Procedure: Evacuation of left neck hematoma;  Surgeon: Katha Cabal, MD;  Location: [facility];  Service: Vascular;  Laterality: Left;   ENDARTERECTOMY Left 11/13/2020   Procedure: ENDARTERECTOMY CAROTID;  Surgeon: Algernon Huxley, MD;  Location: [facility];  Service: Vascular;  Laterality: Left;   TONSILLECTOMY     TUBAL LIGATION      There were no vitals filed for this visit.   Subjective Assessment - 04/15/21 1430     Subjective Pt reports no pain currently and no LOB/falls. Pt reports she walked at the grocery store and used her walker.    Patient is accompained by: --   Daughter, Cassandra Schaefer   Pertinent History 87 y.o. female with medical history significant for type 2 diabetes mellitus, essential hypertension, acquired hypothyroidism, hyperlipidemia, stage III chronic kidney disease reports increased right sided weakness on 10/19/20. She was diagnosed with left basal ganglia ischemic stroke. She was discharged to inpatient rehab for 10 days and then discharged home. Patient was evaluated by outpatient PT on 11/12/20. CVA was due to carotid stenosis. She underwent left carotid endartectomy on 7/21. Procedure went well however patient  suffered from left cervical hematoma and had subsequent surgical procedure to stop the bleed and remove the hematoma on 11/13/20. They were concerned with her breathing and therefore intubated her for 1 day, she was in ICU for 2 days and transferred to med/surge on 11/15/20. Patient was discharged home on 11/17/20 and is now returning to outpatient PT. She has had the stiches removed and is healing well. Denies any neck pain or stiffness. She lives with her daughter and has 24/7 caregiver support. She is still using RW for most ambulation. She is able to transfer to bedside commode well and is able to stand short time  unsupported. She reports feeling very weak and fatigued. She reports she has been dragging her right foot more. She denies any numbness/tingling; She reports feeling like her legs are wanting to do more but is hesitant because of fear of falling. She is mod I for self care morning routine. She does still receive help with showering. She has been working on increasing her activity but denies any formal exercise.    Limitations Standing;Walking    How long can you sit comfortably? NA    How long can you stand comfortably? 30-60 min with support    How long can you walk comfortably? about 100 feet with RW, still limited to short distances;    Diagnostic tests MRI shows left basal ganglia infarct 10/21/20    Patient Stated Goals "Improve balance and walking and not need RW, get back to independent."    Currently in Pain? No/denies    Pain Onset In the past 7 days             INTERVENTIONS   STS 10x hands-free with no warm-up attempt needed. Cuing for upright posture.   PT assess hurry cane height for pt. PT determines no adjustment required currently.   Ambulation with pt's hurry cane and close CGA for focus on dynamic balance and secondary focus on endurance -1x148 ft, 1x38 ft. Recovery interval between sets. Pt with only one instance of unsteadiness. Otherwise pt cued for improved sequencing. --progress to dual task to naming people, animals, bugs 2x148 ft. Saw improvement with sequencing with cane and gait speed but decrease in R foot clearance. Pt rates as medium.  Dynamic ambulation with hurry cane using vertical and horizontal head turns 2x10 meters for each. Pt reports she felt unsteady but exhibits no LOB. However, pt must slow gait speed to perform.  Obstacle course: navigating cones, orange hurdle and half-foam for foot clearance with hurry cane 4x through. --progressed to performing with 1.5# weights donned on BLEs. 2x through.  STS 1x10, 1x5 hands-free. Recovery interval between  sets. Cuing for full upright posture with standing.   Pt educated throughout session about proper posture and technique with exercises. Improved exercise technique, movement at target joints, use of target muscles after min to mod verbal, visual, tactile cues.  PT Education - 04/15/21 1527     Education Details Continued exercise technique with Abbie Sons, body mechanics    Person(s) Educated Patient    Methods Demonstration;Explanation;Verbal cues    Comprehension Verbalized understanding;Returned demonstration;Verbal cues required;Need further instruction              PT Short Term Goals - 04/02/21 1525       PT SHORT TERM GOAL #1   Title Patient will be adherent to HEP at least 3x a week to improve functional strength and balance for better safety at home.    Baseline  9/21: Pt reports compliance with HEP and is confident    Time 4    Period Weeks    Status Achieved    Target Date 12/31/20      PT SHORT TERM GOAL #2   Title Patient will be independent in transferring sit<>Stand without pushing on arm rests to improve ability to get up from chair.    Baseline 9/21: pt able to complete STS without use of UEs    Time 4    Period Weeks    Status Achieved    Target Date 12/31/20               PT Long Term Goals - 04/02/21 0836       PT LONG TERM GOAL #1   Title Patient (> 2 years old) will complete five times sit to stand test in < 15 seconds indicating an increased LE strength and improved balance.    Baseline 9/21: 29 seconds hands-free 10/10: deferred 10/12: 34 sec hands free; 11/2: 23.8 sec hands-free; 12/7: 33.2 sec hands free    Time 8    Period Weeks    Status On-going    Target Date 04/27/21      PT LONG TERM GOAL #2   Title Patient will increase six minute walk test distance to >400 feet for improve gait ability    Baseline 9/21: 368 ft with RW; 10/10: deferred 10/12: 408 ft; 11/2: 476 ft with 4WW    Time 8    Period Weeks    Status Achieved    Target  Date 04/27/21      PT LONG TERM GOAL #3   Title Patient will increase 10 meter walk test to >1.32ms as to improve gait speed for better community ambulation and to reduce fall risk.    Baseline 9/21: 0.5 m/s with RW; 10/10 deferred 10/12: 0.93 m/s with 42OF 11/2: 0.83 m/s with 4WW; 12/7: 0.52 m/s with RW    Time 8    Period Weeks    Status Partially Met    Target Date 04/27/21      PT LONG TERM GOAL #4   Title Patient will be independent with ascend/descend 6 steps using single UE in step over step pattern without LOB.    Baseline 9/21:  Patient uses PT stairs (4 steps) with bilateral upper extremity support on handrails for both ascending/descending.  Patient exhibits reciprocal pattern ascending and step to pattern descending. 10/10: deferred; 10/12: ascending/descending recip. steps with BUE support; 11/2: Ascended/descended 8 steps total, reciprocal pattern ascending and step-to descending. Pt uses UUE support throughout with CGA assist.; 12/7: Ascended and descended 8 steps total with UUE support and CGA. Reciprocal stepping ascending and step to descending.    Time 8    Period Weeks    Status Partially Met    Target Date 04/27/21      PT LONG TERM GOAL #5   Title Patient will demonstrate an improved Berg Balance Score of >45/56 as to demonstrate improved balance with ADLs such as sitting/standing and transfer balance and reduced fall risk.    Baseline 9/21: deferred d/t time; 9/29: 42/56; 10/10: deferred; 10/12: 44/56; 11/2: deferred; 11/7: 46/56    Time 8    Period Weeks    Status Achieved    Target Date 04/27/21      PT LONG TERM GOAL #6   Title Patient will improve FOTO score to >60% to indicate improved functional mobility with ADLs.    Baseline 9/21:  59; 10/10: 57; 11/2: 60; 12/7: 58%    Time 8    Period Weeks    Status Partially Met    Target Date 04/27/21                   Plan - 04/15/21 1529     Clinical Impression Statement Pt presents with excellent  motivation to participate in session. Pt able to advance dynamic balance activities to ambulating with Abbie Sons through obstacle course, navigating cones and clearing obstacles. Pt is still challenged with foot clearance, and requires CGA throughout. The pt will benefit from further skilled PT to improve strength, gait, endurance, and balance to increase ease and safety with ADLs.    Personal Factors and Comorbidities Age    Comorbidities HTN, CKD, fall risk, type 2 diabetes,    Examination-Activity Limitations Lift;Locomotion Level;Squat;Stairs;Stand;Toileting;Transfers;Bathing    Examination-Participation Restrictions Cleaning;Community Activity;Laundry;Meal Prep;Shop;Volunteer;Driving    Stability/Clinical Decision Making Stable/Uncomplicated    Rehab Potential Poor    PT Frequency 2x / week    PT Duration 12 weeks    PT Treatment/Interventions Cryotherapy;Electrical Stimulation;Moist Heat;Gait training;Stair training;Functional mobility training;Therapeutic activities;Therapeutic exercise;Neuromuscular re-education;Balance training;Patient/family education;Orthotic Fit/Training;Energy conservation    PT Next Visit Plan work on LE strengthening and balance; gait training, endurance, continue POC as previously indicated    PT Home Exercise Plan Access Code: [email or web address]; no updates    Consulted and Agree with Plan of Care Patient             Patient will benefit from skilled therapeutic intervention in order to improve the following deficits and impairments:  Abnormal gait, Decreased balance, Decreased endurance, Decreased mobility, Difficulty walking, Cardiopulmonary status limiting activity, Decreased activity tolerance, Decreased coordination, Decreased strength  Visit Diagnosis: Other lack of coordination  Other abnormalities of gait and mobility  Unsteadiness on feet  Muscle weakness (generalized)     Problem List Patient Active Problem List   Diagnosis Date Noted   Carotid  stenosis, symptomatic, with infarction ([place]) 11/13/2020   Gout flare 11/10/2020   UTI (urinary tract infection) 11/10/2020   Left carotid artery stenosis 11/10/2020   Chronic kidney disease 11/10/2020   Left basal ganglia embolic stroke ([place]) 15/08/6977   PAC (premature atrial contraction)    Pre-procedural cardiovascular examination    Ischemic stroke ([place]) 10/20/2020   TIA (transient ischemic attack) 10/19/2020   Right hemiparesis ([place]) 10/19/2020   Type 2 diabetes mellitus with stage 3 chronic kidney disease, without long-term current use of insulin ([place]) 08/24/2018   Hyperlipidemia, unspecified 07/19/2017   Hypertension 07/19/2017   PUD (peptic ulcer disease) 07/19/2017   Type 2 diabetes mellitus ([place]) 07/19/2017   Personal history of gout 08/12/2016   Anemia, unspecified 04/09/2016   Hypothyroidism, acquired 12/10/2015    Zollie Pee, PT 04/15/2021, 3:34 PM  [place] MAIN [facility] SERVICES [address] [place], [place], [address] Phone: [phone]   Fax:  [phone]  Name: Ketura Sirek MRN: [id number] Date of Birth: 1929/03/29

## 2021-04-16 NOTE — Therapy (Signed)
[place] MAIN [facility] SERVICES [address] [place], [place], [address] Phone: [phone]   Fax:  [phone]  Occupational Therapy Treatment  Patient Details  Name: Cassandra Schaefer MRN: [id number] Date of Birth: 1928-10-25 Referring Provider (OT): Dr. Barbette Reichmann   Encounter Date: 04/15/2021   OT End of Session - 04/16/21 1056     Visit Number 37    Number of Visits 40    Date for OT Re-Evaluation 04/21/21    Authorization Time Period Reporting period starting 02/16/2021    OT Start Time 1518    OT Stop Time 1558    OT Time Calculation (min) 40 min    Equipment Utilized During Treatment tranport chair    Activity Tolerance Patient tolerated treatment well    Behavior During Therapy WFL for tasks assessed/performed             Past Medical History:  Diagnosis Date   Anemia    Cough    RESOLVING, NO FEVER   Diabetes mellitus without complication ([facility])    Gout    Hypertension    Hypothyroidism    PUD (peptic ulcer disease)    Stroke [facility])    Wears dentures    full upper, partial lower    Past Surgical History:  Procedure Laterality Date   AMPUTATION TOE Left 12/14/2017   Procedure: AMPUTATION TOE-4TH MPJ;  Surgeon: Gwyneth Revels, DPM;  Location: [facility];  Service: Podiatry;  Laterality: Left;  IVA LOCAL Diabetic - oral meds   APPENDECTOMY     CATARACT EXTRACTION W/PHACO Right 06/23/2015   Procedure: CATARACT EXTRACTION PHACO AND INTRAOCULAR LENS PLACEMENT (IOC);  Surgeon: Sallee Lange, MD;  Location: [facility];  Service: Ophthalmology;  Laterality: Right;  [place] 01:28 AP% 23.4 CDE 36.14 fluid pack lot # [id number]   CATARACT EXTRACTION W/PHACO Left 07/28/2015   Procedure: CATARACT EXTRACTION PHACO AND INTRAOCULAR LENS PLACEMENT (IOC);  Surgeon: Sallee Lange, MD;  Location: [facility];  Service: Ophthalmology;  Laterality: Left;  [place]    1:29.7 AP%  24.9 CDE   41.32 fluid casette lot #[id number] H exp  09/23/2016   COONOSCOPY     AND ENDOSCOPY   DILATION AND CURETTAGE OF UTERUS     ENDARTERECTOMY Left 11/13/2020   Procedure: Evacuation of left neck hematoma;  Surgeon: Renford Dills, MD;  Location: [facility];  Service: Vascular;  Laterality: Left;   ENDARTERECTOMY Left 11/13/2020   Procedure: ENDARTERECTOMY CAROTID;  Surgeon: Annice Needy, MD;  Location: [facility];  Service: Vascular;  Laterality: Left;   TONSILLECTOMY     TUBAL LIGATION      There were no vitals filed for this visit.   Subjective Assessment - 04/15/21 1050     Subjective  Pt reports doing well today and pleased that she's been practing with her cane with the PT.    Patient is accompanied by: Family member    Pertinent History R ankle pain, gout, T2DM, HTN, CKDIII; R/L carotid endarectomy    Patient Stated Goals "I want to improve my aim when I'm using my R arm."    Currently in Pain? No/denies    Pain Score 0-No pain    Pain Onset In the past 7 days            Occupational Therapy Treatment: Neuro re-ed: Pt participated in in hand manipulation skills rotating small blocks in R hand to place on table top to form a block puzzle.  Participated in grip  strengthening with use of hand gripper set at min resistance with 1 red band to complete 3 sets 10 reps each hand.  Practiced hand manipulation skills with placing small pegs into peg board, gathering 1 at a time, storing in palm, discarding 1 a time, gathering fistful and discarding 1 at a time.  Vc needed to practice repositioning peg within fingertips of hand instead of using table top or pegboard to reposition.  Extra time needed.    Response to Treatment: See Plan/clinical impression below.    OT Education - 04/15/21 1055     Education Details RUE GMC/FMC exercises    Person(s) Educated Patient    Methods Explanation;Verbal cues;Demonstration;Tactile cues    Comprehension Verbalized understanding;Verbal cues required;Returned demonstration               OT Short Term Goals - 03/23/21 1657       OT SHORT TERM GOAL #1   Title Pt will perform HEP for RUE strength/coordination independently.    Baseline Eval: HEP not yet established in outpatient, but pt is working with putty from [name]; 12/04/2020; [facility] [place] HEP initiated this day with mod vc after return demo; 01/05/2021: indep with currently program, but ongoing as pt progresses; 01/28/2021: vc to revisit and focus on grip and pinch strengthening with putty; 03/23/21: pt is indep with HEP    Time 6    Period Weeks    Status Achieved    Target Date 03/23/21               OT Long Term Goals - 03/23/21 1659       OT LONG TERM GOAL #1   Title Pt will improve hand writing to 100% legibility with R hand to be able to independently write a check.    Baseline Eval: signature is 75% legible with R hand, requiring extra time; 12/04/2020: no change from eval; 01/05/2021: Printing is 100% legible, signature is 90% legible, but still requires extra time and effort.; 01/26/21: Signature is 90% legible and speed/fluidity have improved. 20th visit: 90% legible, 03/23/21: 90% legible, extra time    Time 12    Period Weeks    Status On-going    Target Date 04/21/21      OT LONG TERM GOAL #2   Title Pt will improve R hand coordination to enable good manipulation of iphone using R dominant hand    Baseline Eval: Pt uses L hand d/t R hand lacking sufficient [place] to press buttons on phone; 12/04/2020: no change from eval; 01/05/2021: R hand coordination is improving but pt still uses L hand to dial phone; 01/28/21: Pt using R dominant hand to press buttons and apps on phone 50% of the time.20th visit: Right hand [facility] skilols are improving, Pt. is improving with manipulation of the iphone; 03/23/21: Pt now consistently using R hand to press buttons on phone with good accuracy.    Time 12    Period Weeks    Status Achieved    Target Date 04/21/21      OT LONG TERM GOAL #3   Title Pt will improve GMC throughout RUE  to enable pt to reach for and pick up ADL supplies with R dominant hand without dropping or knocking over objects.    Baseline Eval: Pt reports RUE is clumsy and easily knocks over objects when trying to reach for ADL supplies.  Pt verbalizes that her "aim is off."; 12/04/2020: pt reports slight improvement since eval and has started using her  R hand to eat, but still feels clumsy; 01/05/2021: Greatly improved; pt is using silverware to eat with R hand, can comb hair with RUE, still mildly ataxic; 01/26/21: pt reports difficulty picking up objects within a small space (ie; may knock the toothpaste holder down when reaching for toothbrush), but reaching for light/larger objects is improving 20th visit: pt. continues to be mildly ataxic, however is consistently improving with motor control, and [place] while reaching targets; 03/23/21: Pt reaches with accuracy towards targets if she takes her time (pt drops and knocks things over when moving at a faster/normal pace)    Time 12    Period Weeks    Status On-going    Target Date 04/21/21      OT LONG TERM GOAL #4   Title Pt will improve FOTO score to 68 or better to indicate measurable functional improvement.    Baseline Eval: FOTO 55; 01/05/2021: FOTO 61; 01/28/21: FOTO 61, 20th visit: 61; 03/23/21: FOTO 60    Time 12    Period Weeks    Status On-going    Target Date 04/21/21      OT LONG TERM GOAL #5   Title Pt will perform dynamic standing tasks with modified indep, AD as needed in order to reduce fall risk with ADLs    Baseline Eval: CGA-min A for all dynamic standing tasks using RW; 12/04/2020: pt is using RW in home with distant supv, but family still provides close supv on stairs; 01/05/2021: Pt now using RW to amb in and around the house, can perform ADLs with RW and modified indep, and can manage stairs at home with modified indep.  No falls reported.    Time 12    Period Weeks    Status Achieved    Target Date 02/01/21               Plan -  04/15/21 1206     Clinical Impression Statement Pt continues to work towards increasing speed and accuracy with R in hand manipulation skills, and continues to work towards improving control of RUE when reaching toward a target.  Pt will continue to benefit from skilled OT to address above noted skills for maximizing accuracy and efficiency with functional use of R dominant arm.    OT Occupational Profile and History Detailed Assessment- Review of Records and additional review of physical, cognitive, psychosocial history related to current functional performance    Occupational performance deficits (Please refer to evaluation for details): ADL's;Leisure;IADL's    Body Structure / Function / Physical Skills ADL;Coordination;Endurance;GMC;UE functional use;Balance;Sensation;Body mechanics;IADL;Pain;Dexterity;FMC;Strength;Gait;Mobility    Rehab Potential Good    Clinical Decision Making Several treatment options, min-mod task modification necessary    Comorbidities Affecting Occupational Performance: May have comorbidities impacting occupational performance    Modification or Assistance to Complete Evaluation  Min-Moderate modification of tasks or assist with assess necessary to complete eval    OT Frequency 2x / week    OT Duration 12 weeks    OT Treatment/Interventions Self-care/ADL training;Therapeutic exercise;DME and/or AE instruction;Functional Mobility Training;Balance training;Neuromuscular education;Therapeutic activities;Patient/family education    Consulted and Agree with Plan of Care Patient             Patient will benefit from skilled therapeutic intervention in order to improve the following deficits and impairments:   Body Structure / Function / Physical Skills: ADL, Coordination, Endurance, GMC, UE functional use, Balance, Sensation, Body mechanics, IADL, Pain, Dexterity, FMC, Strength, Gait, Mobility  Visit Diagnosis: Apraxia  Muscle weakness (generalized)  Other  lack of coordination    Problem List Patient Active Problem List   Diagnosis Date Noted   Carotid stenosis, symptomatic, with infarction ([place]) 11/13/2020   Gout flare 11/10/2020   UTI (urinary tract infection) 11/10/2020   Left carotid artery stenosis 11/10/2020   Chronic kidney disease 11/10/2020   Left basal ganglia embolic stroke ([place]) [id number]   PAC (premature atrial contraction)    Pre-procedural cardiovascular examination    Ischemic stroke ([place]) 10/20/2020   TIA (transient ischemic attack) 10/19/2020   Right hemiparesis ([place]) 10/19/2020   Type 2 diabetes mellitus with stage 3 chronic kidney disease, without long-term current use of insulin ([place]) 08/24/2018   Hyperlipidemia, unspecified 07/19/2017   Hypertension 07/19/2017   PUD (peptic ulcer disease) 07/19/2017   Type 2 diabetes mellitus ([place]) 07/19/2017   Personal history of gout 08/12/2016   Anemia, unspecified 04/09/2016   Hypothyroidism, acquired 12/10/2015   Cassandra Speller, MS, OTR/L  Cassandra Schaefer, OT 04/16/2021, 12:07 PM  [place] MAIN [facility] SERVICES [address] [place], [place], [address] Phone: [phone]   Fax:  [phone]  Name: Cassandra Schaefer MRN: [id number] Date of Birth: 03/02/1929

## 2021-04-21 ENCOUNTER — Encounter: Payer: Medicare HMO | Admitting: Occupational Therapy

## 2021-04-21 ENCOUNTER — Ambulatory Visit: Payer: Medicare HMO | Admitting: Physical Therapy

## 2021-04-22 ENCOUNTER — Encounter: Payer: Medicare HMO | Admitting: Speech Pathology

## 2021-04-22 ENCOUNTER — Ambulatory Visit: Payer: Medicare HMO

## 2021-04-23 ENCOUNTER — Ambulatory Visit: Payer: Medicare HMO | Admitting: Occupational Therapy

## 2021-04-23 ENCOUNTER — Other Ambulatory Visit: Payer: Self-pay

## 2021-04-23 ENCOUNTER — Encounter: Payer: Self-pay | Admitting: Occupational Therapy

## 2021-04-23 ENCOUNTER — Ambulatory Visit: Payer: Medicare HMO

## 2021-04-23 DIAGNOSIS — M6281 Muscle weakness (generalized): Secondary | ICD-10-CM | POA: Diagnosis not present

## 2021-04-23 DIAGNOSIS — R278 Other lack of coordination: Secondary | ICD-10-CM

## 2021-04-23 DIAGNOSIS — R2681 Unsteadiness on feet: Secondary | ICD-10-CM

## 2021-04-23 DIAGNOSIS — R2689 Other abnormalities of gait and mobility: Secondary | ICD-10-CM

## 2021-04-23 NOTE — Therapy (Signed)
[place] MAIN [facility] SERVICES [address] [place], [place], [address] Phone: [phone]   Fax:  [phone]  Occupational Therapy Treatment/Recertification  Patient Details  Name: Cassandra Schaefer MRN: [id number] Date of Birth: [birth year] Referring Provider (OT): Dr. Tracie Harrier   Encounter Date: 04/23/2021   OT End of Session - 04/23/21 1726     Visit Number 38    Number of Visits 40    Date for OT Re-Evaluation 07/14/21    Authorization Time Period Reporting period starting 02/16/2021    OT Start Time 1515    OT Stop Time 1600    OT Time Calculation (min) 45 min    Activity Tolerance Patient tolerated treatment well    Behavior During Therapy WFL for tasks assessed/performed             Past Medical History:  Diagnosis Date   Anemia    Cough    RESOLVING, NO FEVER   Diabetes mellitus without complication ([place])    Gout    Hypertension    Hypothyroidism    PUD (peptic ulcer disease)    Stroke [facility])    Wears dentures    full upper, partial lower    Past Surgical History:  Procedure Laterality Date   AMPUTATION TOE Left 12/14/2017   Procedure: AMPUTATION TOE-4TH MPJ;  Surgeon: Samara Deist, DPM;  Location: [place];  Service: Podiatry;  Laterality: Left;  IVA LOCAL Diabetic - oral meds   APPENDECTOMY     CATARACT EXTRACTION W/PHACO Right 06/23/2015   Procedure: CATARACT EXTRACTION PHACO AND INTRAOCULAR LENS PLACEMENT (IOC);  Surgeon: Estill Cotta, MD;  Location: [facility];  Service: Ophthalmology;  Laterality: Right;  [place] 01:28 AP% 23.4 CDE 36.14 fluid pack lot # [id number]   CATARACT EXTRACTION W/PHACO Left 07/28/2015   Procedure: CATARACT EXTRACTION PHACO AND INTRAOCULAR LENS PLACEMENT (IOC);  Surgeon: Estill Cotta, MD;  Location: [facility];  Service: Ophthalmology;  Laterality: Left;  [place]    1:29.7 AP%  24.9 CDE   41.32 fluid casette lot [id number] H exp 09/23/2016   COONOSCOPY     AND ENDOSCOPY    DILATION AND CURETTAGE OF UTERUS     ENDARTERECTOMY Left 11/13/2020   Procedure: Evacuation of left neck hematoma;  Surgeon: Katha Cabal, MD;  Location: [facility];  Service: Vascular;  Laterality: Left;   ENDARTERECTOMY Left 11/13/2020   Procedure: ENDARTERECTOMY CAROTID;  Surgeon: Algernon Huxley, MD;  Location: [facility];  Service: Vascular;  Laterality: Left;   TONSILLECTOMY     TUBAL LIGATION      There were no vitals filed for this visit.   Subjective Assessment - 04/23/21 1725     Subjective  Pt reports having had a nice Christmas, however it tired her out.    Patient is accompanied by: Family member    Pertinent History R ankle pain, gout, T2DM, HTN, CKDIII; R/L carotid endarectomy    Currently in Pain? No/denies            OT TREATMENT    There. Ex.:  Pt. worked on [employer], and reciprocal motion using the UBE while seated for 8 min. with no resistance. Constant monitoring was provided.    Neuro muscular re-education:   Pt. worked on [facility] skills grasping 1" sticks. Pt. worked on storing the objects in the palm, and translatory skills moving the items from the palm of the hand to the tip of the 2nd digit, and thumb. Pt. Worked on motor  control, and depth perception skills placing yellow, red, green, and blue resistive clips onto a designated target.  Pt. worked on placing the clips at various horizontal angles. Pt. Worked on accuracy with [facility] skills grasping 1/2" flat marbles, and placing them onto vertical pegs.   Pt. continues to make progress overall with her RIght UE functioning. Pt. Continues to present with impaired depth perception, often undershooting when  reaching for targets. Pt. is improving with right hand function, and is now using her right hand more during ADL, and IADL tasks at home. As pt. was working on translatory movements the task was modified from storing multiple items, to one item.  Pt. Dropped multiple 1" sticks from her hand, and reports that  the sticks feel like they stick in her hand. Pt. continues to present with difficulty with [facility] skills manipulating small objects.  Pt. continues to work on improving [facility] skills, motor control, and accuracy with reaching in order to work towards improving right hand function, and improving/maximizing independence with ADLs, and IADLs.                        OT Education - 04/23/21 1726     Education Details RUE GMC/FMC exercises    Person(s) Educated Patient    Methods Explanation;Verbal cues;Demonstration;Tactile cues    Comprehension Verbalized understanding;Verbal cues required;Returned demonstration              OT Short Term Goals - 03/23/21 1657       OT SHORT TERM GOAL #1   Title Pt will perform HEP for RUE strength/coordination independently.    Baseline Eval: HEP not yet established in outpatient, but pt is working with putty from [name]; 12/04/2020; [facility] HEP initiated this day with mod vc after return demo; 01/05/2021: indep with currently program, but ongoing as pt progresses; 01/28/2021: vc to revisit and focus on grip and pinch strengthening with putty; 03/23/21: pt is indep with HEP    Time 6    Period Weeks    Status Achieved    Target Date 03/23/21               OT Long Term Goals - 04/23/21 1727       OT LONG TERM GOAL #1   Title Pt will improve hand writing to 100% legibility with R hand to be able to independently write a check.    Baseline Eval: signature is 75% legible with R hand, requiring extra time; 12/04/2020: no change from eval; 01/05/2021: Printing is 100% legible, signature is 90% legible, but still requires extra time and effort.; 01/26/21: Signature is 90% legible and speed/fluidity have improved. 20th visit: 90% legible, 03/23/21: 90% legible, extra time    Time 12    Period Weeks    Status On-going    Target Date 07/14/21      OT LONG TERM GOAL #3   Title Pt will improve GMC throughout RUE to enable pt to reach for and pick up  ADL supplies with R dominant hand without dropping or knocking over objects.    Baseline Eval: Pt reports RUE is clumsy and easily knocks over objects when trying to reach for ADL supplies.  Pt verbalizes that her "aim is off."; 12/04/2020: pt reports slight improvement since eval and has started using her R hand to eat, but still feels clumsy; 01/05/2021: Greatly improved; pt is using silverware to eat with R hand, can comb hair with RUE, still mildly ataxic;  01/26/21: pt reports difficulty picking up objects within a small space (ie; may knock the toothpaste holder down when reaching for toothbrush), but reaching for light/larger objects is improving 20th visit: pt. continues to be mildly ataxic, however is consistently improving with motor control, and [place] while reaching targets; 03/23/21: Pt reaches with accuracy towards targets if she takes her time (pt drops and knocks things over when moving at a faster/normal pace) 04/23/2021: Pt. continues to present with difficulty with depth perception, and impaired [place].    Time 12    Period Weeks    Status On-going    Target Date 07/14/21      OT LONG TERM GOAL #4   Title Pt will improve FOTO score to 68 or better to indicate measurable functional improvement.    Baseline Eval: FOTO 55; 01/05/2021: FOTO 61; 01/28/21: FOTO 61, 20th visit: 61; 03/23/21: FOTO 60    Time 12    Period Weeks    Status On-going    Target Date 07/14/21                   Plan - 04/23/21 1727     Clinical Impression Statement Pt. continues to make progress overall with her RIght UE functioning. Pt. Continues to present with impaired depth perception, often undershooting when  reaching for targets. Pt. is improving with right hand function, and is now using her right hand more during ADL, and IADL tasks at home. As pt. was working on translatory movements the task was modified from storing multiple items, to one item.  Pt. Dropped multiple 1" sticks from her hand, and reports  that the sticks feel like they stick in her hand. Pt. continues to present with difficulty with [facility] skills manipulating small objects.  Pt. continues to work on improving [facility] [address] skills, motor control, and accuracy with reaching in order to work towards improving right hand function, and improving/maximizing independence with ADLs, and IADLs.     OT Occupational Profile and History Detailed Assessment- Review of Records and additional review of physical, cognitive, psychosocial history related to current functional performance    Occupational performance deficits (Please refer to evaluation for details): ADL's;Leisure;IADL's    Body Structure / Function / Physical Skills ADL;Coordination;Endurance;GMC;UE functional use;Balance;Sensation;Body mechanics;IADL;Pain;Dexterity;FMC;Strength;Gait;Mobility    Rehab Potential Good    Clinical Decision Making Several treatment options, min-mod task modification necessary    Comorbidities Affecting Occupational Performance: May have comorbidities impacting occupational performance    Modification or Assistance to Complete Evaluation  Min-Moderate modification of tasks or assist with assess necessary to complete eval    OT Frequency 2x / week    OT Duration 12 weeks    OT Treatment/Interventions Self-care/ADL training;Therapeutic exercise;DME and/or AE instruction;Functional Mobility Training;Balance training;Neuromuscular education;Therapeutic activities;Patient/family education    Consulted and Agree with Plan of Care Patient             Patient will benefit from skilled therapeutic intervention in order to improve the following deficits and impairments:   Body Structure / Function / Physical Skills: ADL, Coordination, Endurance, GMC, UE functional use, Balance, Sensation, Body mechanics, IADL, Pain, Dexterity, FMC, Strength, Gait, Mobility       Visit Diagnosis: Muscle weakness (generalized)  Other lack of coordination    Problem List Patient  Active Problem List   Diagnosis Date Noted   Carotid stenosis, symptomatic, with infarction ([place]) 11/13/2020   Gout flare 11/10/2020   UTI (urinary tract infection) 11/10/2020   Left carotid artery stenosis 11/10/2020  Chronic kidney disease 11/10/2020   Left basal ganglia embolic stroke (HCC) 10/24/2020   PAC (premature atrial contraction)    Pre-procedural cardiovascular examination    Ischemic stroke (HCC) 10/20/2020   TIA (transient ischemic attack) 10/19/2020   Right hemiparesis (HCC) 10/19/2020   Type 2 diabetes mellitus with stage 3 chronic kidney disease, without long-term current use of insulin (HCC) 08/24/2018   Hyperlipidemia, unspecified 07/19/2017   Hypertension 07/19/2017   PUD (peptic ulcer disease) 07/19/2017   Type 2 diabetes mellitus (HCC) 07/19/2017   Personal history of gout 08/12/2016   Anemia, unspecified 04/09/2016   Hypothyroidism, acquired 12/10/2015    Olegario Messier, MS,OTR/L 04/23/2021, 5:42 PM  [facility] MAIN [facility] SERVICES [address] [place], [place], [address] Phone: [phone]   Fax:  [phone]  Name: Jalissa Heinzelman MRN: [id number] Date of Birth: 31-Jul-1928

## 2021-04-23 NOTE — Therapy (Signed)
[place] MAIN [facility] SERVICES [address] [place], [place], [address] Phone: [phone]   Fax:  [phone]  Physical Therapy Treatment  Patient Details  Name: Cassandra Schaefer MRN: [id number] Date of Birth: 1928/07/27 Referring Provider (PT): Dr. Dew/Dr. Ginette Pitman PCP   Encounter Date: 04/23/2021   PT End of Session - 04/23/21 1518     Visit Number 35    Number of Visits 38    Date for PT Re-Evaluation 04/27/21    Authorization Type [employer]    Authorization Time Period 02/02/21-04/27/21    Progress Note Due on Visit 28    PT Start Time 1430    PT Stop Time 1512    PT Time Calculation (min) 42 min    Equipment Utilized During Treatment Gait belt    Activity Tolerance Patient tolerated treatment well    Behavior During Therapy WFL for tasks assessed/performed             Past Medical History:  Diagnosis Date   Anemia    Cough    RESOLVING, NO FEVER   Diabetes mellitus without complication ([place])    Gout    Hypertension    Hypothyroidism    PUD (peptic ulcer disease)    Stroke [facility])    Wears dentures    full upper, partial lower    Past Surgical History:  Procedure Laterality Date   AMPUTATION TOE Left 12/14/2017   Procedure: AMPUTATION TOE-4TH MPJ;  Surgeon: Samara Deist, DPM;  Location: [place];  Service: Podiatry;  Laterality: Left;  IVA LOCAL Diabetic - oral meds   APPENDECTOMY     CATARACT EXTRACTION W/PHACO Right 06/23/2015   Procedure: CATARACT EXTRACTION PHACO AND INTRAOCULAR LENS PLACEMENT (IOC);  Surgeon: Estill Cotta, MD;  Location: [facility];  Service: Ophthalmology;  Laterality: Right;  [place] 01:28 AP% 23.4 CDE 36.14 fluid pack lot # [id number]   CATARACT EXTRACTION W/PHACO Left 07/28/2015   Procedure: CATARACT EXTRACTION PHACO AND INTRAOCULAR LENS PLACEMENT (IOC);  Surgeon: Estill Cotta, MD;  Location: [facility];  Service: Ophthalmology;  Laterality: Left;  [place]    1:29.7 AP%  24.9 CDE    41.32 fluid casette lot #[id number] H exp 09/23/2016   COONOSCOPY     AND ENDOSCOPY   DILATION AND CURETTAGE OF UTERUS     ENDARTERECTOMY Left 11/13/2020   Procedure: Evacuation of left neck hematoma;  Surgeon: Katha Cabal, MD;  Location: [facility];  Service: Vascular;  Laterality: Left;   ENDARTERECTOMY Left 11/13/2020   Procedure: ENDARTERECTOMY CAROTID;  Surgeon: Algernon Huxley, MD;  Location: [facility];  Service: Vascular;  Laterality: Left;   TONSILLECTOMY     TUBAL LIGATION      There were no vitals filed for this visit.   Subjective Assessment - 04/23/21 1517     Subjective Pt repots no pain currently. Pt reports no stumbles/LOB. She had a good Christmas and reports she is doing better with her mobility at home.    Patient is accompained by: --   Daughter, [name]   Pertinent History 87 y.o. female with medical history significant for type 2 diabetes mellitus, essential hypertension, acquired hypothyroidism, hyperlipidemia, stage III chronic kidney disease reports increased right sided weakness on 10/19/20. She was diagnosed with left basal ganglia ischemic stroke. She was discharged to inpatient rehab for 10 days and then discharged home. Patient was evaluated by outpatient PT on 11/12/20. CVA was due to carotid stenosis. She underwent left carotid endartectomy on 7/21.  Procedure went well however patient suffered from left cervical hematoma and had subsequent surgical procedure to stop the bleed and remove the hematoma on 11/13/20. They were concerned with her breathing and therefore intubated her for 1 day, she was in ICU for 2 days and transferred to med/surge on 11/15/20. Patient was discharged home on 11/17/20 and is now returning to outpatient PT. She has had the stiches removed and is healing well. Denies any neck pain or stiffness. She lives with her daughter and has 24/7 caregiver support. She is still using RW for most ambulation. She is able to transfer to bedside commode well and is able  to stand short time unsupported. She reports feeling very weak and fatigued. She reports she has been dragging her right foot more. She denies any numbness/tingling; She reports feeling like her legs are wanting to do more but is hesitant because of fear of falling. She is mod I for self care morning routine. She does still receive help with showering. She has been working on increasing her activity but denies any formal exercise.    Limitations Standing;Walking    How long can you sit comfortably? NA    How long can you stand comfortably? 30-60 min with support    How long can you walk comfortably? about 100 feet with RW, still limited to short distances;    Diagnostic tests MRI shows left basal ganglia infarct 10/21/20    Patient Stated Goals "Improve balance and walking and not need RW, get back to independent."    Currently in Pain? No/denies    Pain Onset In the past 7 days              INTERVENTIONS - gait belt donned and CGA used with all upright interventions unless otherwise specified   Nustep HIIT. Pt maintained SPM primarily in 58s. Level 1 for 1 minute  Level 4 for 30 sec  Level 1 for 1 minute Level 4 for 30 sec  Level 1 for 1 minute  Level 4 for 30 sec  Level 1 for 1 minute  Comments:  Close CGA-min a for mount/dismount. Pt monitored for exercise response throughout.  Patient reports no pain or discomfort with intervention. Pt does require extended rest interval due to BLE fatigue. Pt rates as medium.   Ambulation with hurrycane and close CGA for focus on endurance and secondary focus on dynamic balance -2x148 ft. Recovery interval following sets.   10MWT with HC: 0.39 m/s with HC (best speed with RW was 0.93 on 02/04/21). Pt performed an additional (untimed) 10 meters with HC -pt trailed QC for 2x10 meters as she reports her daughter is purchasing her one  STS 2x6 hands-free. Cuing for upright posture. Pt rates as medium.   In // bars, CGA, BUE support on  bars: Heel raises 2x10 Toe raises 2x10  Amb. with HC, CGA to transport chair x 32 ft  Pt educated throughout session about proper posture and technique with exercises. Improved exercise technique, movement at target joints, use of target muscles after min to mod verbal, visual, tactile cues.    PT Education - 04/23/21 1518     Education Details exercise technique, technique with QC, body mechanics    Person(s) Educated Patient    Methods Explanation;Demonstration;Tactile cues;Verbal cues    Comprehension Verbalized understanding;Returned demonstration;Need further instruction              PT Short Term Goals - 04/02/21 1525       PT  SHORT TERM GOAL #1   Title Patient will be adherent to HEP at least 3x a week to improve functional strength and balance for better safety at home.    Baseline 9/21: Pt reports compliance with HEP and is confident    Time 4    Period Weeks    Status Achieved    Target Date 12/31/20      PT SHORT TERM GOAL #2   Title Patient will be independent in transferring sit<>Stand without pushing on arm rests to improve ability to get up from chair.    Baseline 9/21: pt able to complete STS without use of UEs    Time 4    Period Weeks    Status Achieved    Target Date 12/31/20               PT Long Term Goals - 04/02/21 0836       PT LONG TERM GOAL #1   Title Patient (> 49 years old) will complete five times sit to stand test in < 15 seconds indicating an increased LE strength and improved balance.    Baseline 9/21: 29 seconds hands-free 10/10: deferred 10/12: 34 sec hands free; 11/2: 23.8 sec hands-free; 12/7: 33.2 sec hands free    Time 8    Period Weeks    Status On-going    Target Date 04/27/21      PT LONG TERM GOAL #2   Title Patient will increase six minute walk test distance to >400 feet for improve gait ability    Baseline 9/21: 368 ft with RW; 10/10: deferred 10/12: 408 ft; 11/2: 476 ft with 4WW    Time 8    Period Weeks     Status Achieved    Target Date 04/27/21      PT LONG TERM GOAL #3   Title Patient will increase 10 meter walk test to >1.19ms as to improve gait speed for better community ambulation and to reduce fall risk.    Baseline 9/21: 0.5 m/s with RW; 10/10 deferred 10/12: 0.93 m/s with 40WC 11/2: 0.83 m/s with 4WW; 12/7: 0.52 m/s with RW    Time 8    Period Weeks    Status Partially Met    Target Date 04/27/21      PT LONG TERM GOAL #4   Title Patient will be independent with ascend/descend 6 steps using single UE in step over step pattern without LOB.    Baseline 9/21:  Patient uses PT stairs (4 steps) with bilateral upper extremity support on handrails for both ascending/descending.  Patient exhibits reciprocal pattern ascending and step to pattern descending. 10/10: deferred; 10/12: ascending/descending recip. steps with BUE support; 11/2: Ascended/descended 8 steps total, reciprocal pattern ascending and step-to descending. Pt uses UUE support throughout with CGA assist.; 12/7: Ascended and descended 8 steps total with UUE support and CGA. Reciprocal stepping ascending and step to descending.    Time 8    Period Weeks    Status Partially Met    Target Date 04/27/21      PT LONG TERM GOAL #5   Title Patient will demonstrate an improved Berg Balance Score of >45/56 as to demonstrate improved balance with ADLs such as sitting/standing and transfer balance and reduced fall risk.    Baseline 9/21: deferred d/t time; 9/29: 42/56; 10/10: deferred; 10/12: 44/56; 11/2: deferred; 11/7: 46/56    Time 8    Period Weeks    Status Achieved    Target Date  04/27/21      PT LONG TERM GOAL #6   Title Patient will improve FOTO score to >60% to indicate improved functional mobility with ADLs.    Baseline 9/21: 59; 10/10: 57; 11/2: 60; 12/7: 58%    Time 8    Period Weeks    Status Partially Met    Target Date 04/27/21                   Plan - 04/23/21 1519     Clinical Impression Statement Pt  continues to show progress with performing multiple endurance/upright intervensions this session, indicating increased activity tolerance. Pt 10MWT was assessed with HC to compare to RW: pt ambulates 0.39 m/s with HC, and pt's best speed with RW is 0.93 m/s. Pt requires close CGA when ambulating with her HC due to unsteadiness. The pt will continue to benefit from further skilled PT to improve strength, gait, endurance and balance to increase ease and safety with ADLs.    Personal Factors and Comorbidities Age    Comorbidities HTN, CKD, fall risk, type 2 diabetes,    Examination-Activity Limitations Lift;Locomotion Level;Squat;Stairs;Stand;Toileting;Transfers;Bathing    Examination-Participation Restrictions Cleaning;Community Activity;Laundry;Meal Prep;Shop;Volunteer;Driving    Stability/Clinical Decision Making Stable/Uncomplicated    Rehab Potential Poor    PT Frequency 2x / week    PT Duration 12 weeks    PT Treatment/Interventions Cryotherapy;Electrical Stimulation;Moist Heat;Gait training;Stair training;Functional mobility training;Therapeutic activities;Therapeutic exercise;Neuromuscular re-education;Balance training;Patient/family education;Orthotic Fit/Training;Energy conservation    PT Next Visit Plan work on LE strengthening and balance; gait training, endurance, continue POC as previously indicated    PT Home Exercise Plan Access Code: [email or web address]; no updates    Consulted and Agree with Plan of Care Patient             Patient will benefit from skilled therapeutic intervention in order to improve the following deficits and impairments:  Abnormal gait, Decreased balance, Decreased endurance, Decreased mobility, Difficulty walking, Cardiopulmonary status limiting activity, Decreased activity tolerance, Decreased coordination, Decreased strength  Visit Diagnosis: Muscle weakness (generalized)  Other abnormalities of gait and mobility  Unsteadiness on feet  Other lack of  coordination     Problem List Patient Active Problem List   Diagnosis Date Noted   Carotid stenosis, symptomatic, with infarction ([place]) 11/13/2020   Gout flare 11/10/2020   UTI (urinary tract infection) 11/10/2020   Left carotid artery stenosis 11/10/2020   Chronic kidney disease 11/10/2020   Left basal ganglia embolic stroke ([place]) 01/22/5746   PAC (premature atrial contraction)    Pre-procedural cardiovascular examination    Ischemic stroke ([place]) 10/20/2020   TIA (transient ischemic attack) 10/19/2020   Right hemiparesis ([place]) 10/19/2020   Type 2 diabetes mellitus with stage 3 chronic kidney disease, without long-term current use of insulin ([place]) 08/24/2018   Hyperlipidemia, unspecified 07/19/2017   Hypertension 07/19/2017   PUD (peptic ulcer disease) 07/19/2017   Type 2 diabetes mellitus ([place]) 07/19/2017   Personal history of gout 08/12/2016   Anemia, unspecified 04/09/2016   Hypothyroidism, acquired 12/10/2015    Zollie Pee, PT 04/23/2021, 3:25 PM  [place] MAIN [facility] SERVICES [address] [place], [place], [address] Phone: [phone]   Fax:  [phone]  Name: Fredrick Dray MRN: [id number] Date of Birth: [birth year]

## 2021-04-28 ENCOUNTER — Ambulatory Visit: Payer: Medicare HMO | Admitting: Physical Therapy

## 2021-04-28 ENCOUNTER — Ambulatory Visit: Payer: Medicare HMO | Attending: Internal Medicine | Admitting: Occupational Therapy

## 2021-04-28 ENCOUNTER — Encounter: Payer: Medicare HMO | Admitting: Speech Pathology

## 2021-04-28 ENCOUNTER — Encounter: Payer: Self-pay | Admitting: Occupational Therapy

## 2021-04-28 ENCOUNTER — Other Ambulatory Visit: Payer: Self-pay

## 2021-04-28 ENCOUNTER — Encounter: Payer: Self-pay | Admitting: Physical Therapy

## 2021-04-28 DIAGNOSIS — R482 Apraxia: Secondary | ICD-10-CM | POA: Diagnosis present

## 2021-04-28 DIAGNOSIS — R2681 Unsteadiness on feet: Secondary | ICD-10-CM

## 2021-04-28 DIAGNOSIS — R2689 Other abnormalities of gait and mobility: Secondary | ICD-10-CM | POA: Insufficient documentation

## 2021-04-28 DIAGNOSIS — R262 Difficulty in walking, not elsewhere classified: Secondary | ICD-10-CM | POA: Insufficient documentation

## 2021-04-28 DIAGNOSIS — R278 Other lack of coordination: Secondary | ICD-10-CM | POA: Diagnosis present

## 2021-04-28 DIAGNOSIS — M6281 Muscle weakness (generalized): Secondary | ICD-10-CM

## 2021-04-28 DIAGNOSIS — R269 Unspecified abnormalities of gait and mobility: Secondary | ICD-10-CM | POA: Diagnosis present

## 2021-04-28 NOTE — Therapy (Signed)
[place] MAIN [facility] SERVICES [place], [place], [address] Phone: [phone]   Fax:  [phone]  Occupational Therapy Treatment  Patient Details  Name: Cassandra Schaefer MRN: [id number] Date of Birth: 1928-08-23 Referring Provider (OT): Dr. Tracie Harrier   Encounter Date: 04/28/2021   OT End of Session - 04/28/21 1117     Visit Number 68    Number of Visits 40    Date for OT Re-Evaluation 07/14/21    Authorization Time Period Reporting period starting 02/16/2021    OT Start Time 1100    OT Stop Time 1145    OT Time Calculation (min) 45 min    Activity Tolerance Patient tolerated treatment well    Behavior During Therapy WFL for tasks assessed/performed             Past Medical History:  Diagnosis Date   Anemia    Cough    RESOLVING, NO FEVER   Diabetes mellitus without complication ([place])    Gout    Hypertension    Hypothyroidism    PUD (peptic ulcer disease)    Stroke [facility])    Wears dentures    full upper, partial lower    Past Surgical History:  Procedure Laterality Date   AMPUTATION TOE Left 12/14/2017   Procedure: AMPUTATION TOE-4TH MPJ;  Surgeon: Samara Deist, DPM;  Location: [place];  Service: Podiatry;  Laterality: Left;  IVA LOCAL Diabetic - oral meds   APPENDECTOMY     CATARACT EXTRACTION W/PHACO Right 06/23/2015   Procedure: CATARACT EXTRACTION PHACO AND INTRAOCULAR LENS PLACEMENT (IOC);  Surgeon: Estill Cotta, MD;  Location: [facility];  Service: Ophthalmology;  Laterality: Right;  [place] 01:28 AP% 23.4 CDE 36.14 fluid pack lot # [id number]   CATARACT EXTRACTION W/PHACO Left 07/28/2015   Procedure: CATARACT EXTRACTION PHACO AND INTRAOCULAR LENS PLACEMENT (IOC);  Surgeon: Estill Cotta, MD;  Location: [facility];  Service: Ophthalmology;  Laterality: Left;  [place]    1:29.7 AP%  24.9 CDE   41.32 fluid casette lot [id number] H exp 09/23/2016   COONOSCOPY     AND ENDOSCOPY   DILATION AND  CURETTAGE OF UTERUS     ENDARTERECTOMY Left 11/13/2020   Procedure: Evacuation of left neck hematoma;  Surgeon: Katha Cabal, MD;  Location: [facility];  Service: Vascular;  Laterality: Left;   ENDARTERECTOMY Left 11/13/2020   Procedure: ENDARTERECTOMY CAROTID;  Surgeon: Algernon Huxley, MD;  Location: [facility];  Service: Vascular;  Laterality: Left;   TONSILLECTOMY     TUBAL LIGATION      There were no vitals filed for this visit.   Subjective Assessment - 04/28/21 1116     Subjective  Pt. reports having had a cold this weekend.    Patient is accompanied by: Family member    Pertinent History R ankle pain, gout, T2DM, HTN, CKDIII; R/L carotid endarectomy    Currently in Pain? No/denies            OT TREATMENT     There. Ex.:   Pt. worked on [employer], and reciprocal motion using the UBE while seated for 8 min. with no resistance. Constant monitoring was provided.    Neuro muscular re-education:  Pt. worked on bilateral hand coordination skills connecting PCP pipe pieces following design patterns. Pt. performed [facility] tasks using the Grooved pegboard. Pt. worked on grasping the grooved pegs from a horizontal position, and moving the pegs to a vertical position in the hand to  prepare for placing them in the grooved slot. The grooved pegboard was placed at a vertical angle.   Pt. continues to make progress overall with her right UE functioning. Pt. continues to present with impaired depth perception, often undershooting when  reaching for targets. Pt. Required increased time, verbal cues, and cures for visual demonstration for turning the grooved pegs between her fingertips, and thumb in preparation for placing them into the grooved pegboard. Pt. was able to follow the design patterns, and accurately with increased time without overshooting, or undershooting when connecting the designs. Pt. continues to work on improving [facility] skills, motor control, and accuracy with reaching in  order to work towards improving right hand function, and improving/maximizing independence with ADLs, and IADLs.                      OT Education - 04/28/21 1117     Education Details RUE GMC/FMC exercises    Person(s) Educated Patient    Methods Explanation;Verbal cues;Demonstration;Tactile cues    Comprehension Verbalized understanding;Verbal cues required;Returned demonstration              OT Short Term Goals - 03/23/21 1657       OT SHORT TERM GOAL #1   Title Pt will perform HEP for RUE strength/coordination independently.    Baseline Eval: HEP not yet established in outpatient, but pt is working with putty from [name]; 12/04/2020; [facility] HEP initiated this day with mod vc after return demo; 01/05/2021: indep with currently program, but ongoing as pt progresses; 01/28/2021: vc to revisit and focus on grip and pinch strengthening with putty; 03/23/21: pt is indep with HEP    Time 6    Period Weeks    Status Achieved    Target Date 03/23/21               OT Long Term Goals - 04/23/21 1727       OT LONG TERM GOAL #1   Title Pt will improve hand writing to 100% legibility with R hand to be able to independently write a check.    Baseline Eval: signature is 75% legible with R hand, requiring extra time; 12/04/2020: no change from eval; 01/05/2021: Printing is 100% legible, signature is 90% legible, but still requires extra time and effort.; 01/26/21: Signature is 90% legible and speed/fluidity have improved. 20th visit: 90% legible, 03/23/21: 90% legible, extra time    Time 12    Period Weeks    Status On-going    Target Date 07/14/21      OT LONG TERM GOAL #3   Title Pt will improve GMC throughout RUE to enable pt to reach for and pick up ADL supplies with R dominant hand without dropping or knocking over objects.    Baseline Eval: Pt reports RUE is clumsy and easily knocks over objects when trying to reach for ADL supplies.  Pt verbalizes that her "aim is  off."; 12/04/2020: pt reports slight improvement since eval and has started using her R hand to eat, but still feels clumsy; 01/05/2021: Greatly improved; pt is using silverware to eat with R hand, can comb hair with RUE, still mildly ataxic; 01/26/21: pt reports difficulty picking up objects within a small space (ie; may knock the toothpaste holder down when reaching for toothbrush), but reaching for light/larger objects is improving 20th visit: pt. continues to be mildly ataxic, however is consistently improving with motor control, and [place] while reaching targets; 03/23/21: Pt reaches with  accuracy towards targets if she takes her time (pt drops and knocks things over when moving at a faster/normal pace) 04/23/2021: Pt. continues to present with difficulty with depth perception, and impaired FMC.    Time 12    Period Weeks    Status On-going    Target Date 07/14/21      OT LONG TERM GOAL #4   Title Pt will improve FOTO score to 68 or better to indicate measurable functional improvement.    Baseline Eval: FOTO 55; 01/05/2021: FOTO 61; 01/28/21: FOTO 61, 20th visit: 61; 03/23/21: FOTO 60    Time 12    Period Weeks    Status On-going    Target Date 07/14/21                   Plan - 04/28/21 1119     Clinical Impression Statement Pt. continues to make progress overall with her right UE functioning. Pt. continues to present with impaired depth perception, often undershooting when  reaching for targets. Pt. Required increased time, verbal cues, and cures for visual demonstration for turning the grooved pegs between her fingertips, and thumb in preparation for placing them into the grooved pegboard. Pt. was able to follow the design patterns, and accurately with increased time without overshooting, or undershooting when connecting the designs. Pt. continues to work on improving [facility] skills, motor control, and accuracy with reaching in order to work towards improving right hand function, and  improving/maximizing independence with ADLs, and IADLs.        OT Occupational Profile and History Detailed Assessment- Review of Records and additional review of physical, cognitive, psychosocial history related to current functional performance    Occupational performance deficits (Please refer to evaluation for details): ADL's;Leisure;IADL's    Body Structure / Function / Physical Skills ADL;Coordination;Endurance;GMC;UE functional use;Balance;Sensation;Body mechanics;IADL;Pain;Dexterity;FMC;Strength;Gait;Mobility    Rehab Potential Good    Clinical Decision Making Several treatment options, min-mod task modification necessary    Comorbidities Affecting Occupational Performance: May have comorbidities impacting occupational performance    Modification or Assistance to Complete Evaluation  Min-Moderate modification of tasks or assist with assess necessary to complete eval    OT Frequency 2x / week    OT Duration 12 weeks    OT Treatment/Interventions Self-care/ADL training;Therapeutic exercise;DME and/or AE instruction;Functional Mobility Training;Balance training;Neuromuscular education;Therapeutic activities;Patient/family education    Consulted and Agree with Plan of Care Patient             Patient will benefit from skilled therapeutic intervention in order to improve the following deficits and impairments:   Body Structure / Function / Physical Skills: ADL, Coordination, Endurance, GMC, UE functional use, Balance, Sensation, Body mechanics, IADL, Pain, Dexterity, FMC, Strength, Gait, Mobility       Visit Diagnosis: Muscle weakness (generalized)  Other lack of coordination    Problem List Patient Active Problem List   Diagnosis Date Noted   Carotid stenosis, symptomatic, with infarction (HCC) 11/13/2020   Gout flare 11/10/2020   UTI (urinary tract infection) 11/10/2020   Left carotid artery stenosis 11/10/2020   Chronic kidney disease 11/10/2020   Left basal ganglia  embolic stroke (HCC) 10/24/2020   PAC (premature atrial contraction)    Pre-procedural cardiovascular examination    Ischemic stroke (HCC) 10/20/2020   TIA (transient ischemic attack) 10/19/2020   Right hemiparesis (HCC) 10/19/2020   Type 2 diabetes mellitus with stage 3 chronic kidney disease, without long-term current use of insulin (HCC) 08/24/2018   Hyperlipidemia, unspecified 07/19/2017  Hypertension 07/19/2017   PUD (peptic ulcer disease) 07/19/2017   Type 2 diabetes mellitus ([place]) 07/19/2017   Personal history of gout 08/12/2016   Anemia, unspecified 04/09/2016   Hypothyroidism, acquired 12/10/2015    Harrel Carina, MS, OTR/L 04/28/2021, 11:23 AM  [place] MAIN [facility] SERVICES [address] [place], [place], [address] Phone: [phone]   Fax:  [phone]  Name: Cassandra Schaefer MRN: [id number] Date of Birth: 1928-06-26

## 2021-04-28 NOTE — Therapy (Signed)
[place] MAIN [facility] SERVICES [address] [place], [place], [address] Phone: [phone]   Fax:  [phone]  Physical Therapy Treatment  Patient Details  Name: Cassandra Schaefer MRN: [id number] Date of Birth: 08-13-1928 Referring Provider (PT): Dr. Dew/Dr. Ginette Pitman PCP   Encounter Date: 04/28/2021   PT End of Session - 04/28/21 1152     Visit Number 36    Number of Visits 68    Date for PT Re-Evaluation 06/23/21    Authorization Type [employer]    Authorization Time Period 02/02/21-04/27/21    Progress Note Due on Visit 69    PT Start Time 1146    PT Stop Time 1228    PT Time Calculation (min) 42 min    Equipment Utilized During Treatment Gait belt    Activity Tolerance Patient tolerated treatment well    Behavior During Therapy WFL for tasks assessed/performed             Past Medical History:  Diagnosis Date   Anemia    Cough    RESOLVING, NO FEVER   Diabetes mellitus without complication ([place])    Gout    Hypertension    Hypothyroidism    PUD (peptic ulcer disease)    Stroke [facility])    Wears dentures    full upper, partial lower    Past Surgical History:  Procedure Laterality Date   AMPUTATION TOE Left 12/14/2017   Procedure: AMPUTATION TOE-4TH MPJ;  Surgeon: Samara Deist, DPM;  Location: [place];  Service: Podiatry;  Laterality: Left;  IVA LOCAL Diabetic - oral meds   APPENDECTOMY     CATARACT EXTRACTION W/PHACO Right 06/23/2015   Procedure: CATARACT EXTRACTION PHACO AND INTRAOCULAR LENS PLACEMENT (IOC);  Surgeon: Estill Cotta, MD;  Location: [facility];  Service: Ophthalmology;  Laterality: Right;  [place] 01:28 AP% 23.4 CDE 36.14 fluid pack lot # [id number]   CATARACT EXTRACTION W/PHACO Left 07/28/2015   Procedure: CATARACT EXTRACTION PHACO AND INTRAOCULAR LENS PLACEMENT (IOC);  Surgeon: Estill Cotta, MD;  Location: [facility];  Service: Ophthalmology;  Laterality: Left;  [place]    1:29.7 AP%  24.9 CDE    41.32 fluid casette lot #[id number] H exp 09/23/2016   COONOSCOPY     AND ENDOSCOPY   DILATION AND CURETTAGE OF UTERUS     ENDARTERECTOMY Left 11/13/2020   Procedure: Evacuation of left neck hematoma;  Surgeon: Katha Cabal, MD;  Location: [facility];  Service: Vascular;  Laterality: Left;   ENDARTERECTOMY Left 11/13/2020   Procedure: ENDARTERECTOMY CAROTID;  Surgeon: Algernon Huxley, MD;  Location: [facility];  Service: Vascular;  Laterality: Left;   TONSILLECTOMY     TUBAL LIGATION      There were no vitals filed for this visit.   Subjective Assessment - 04/28/21 1151     Subjective Patient reports having a good holiday. She currently has a mild cold and has been more fatigued. Denies any pain; Denies any falls/stumbles.    Patient is accompained by: --   Daughter, [name]   Pertinent History 88 y.o. female with medical history significant for type 2 diabetes mellitus, essential hypertension, acquired hypothyroidism, hyperlipidemia, stage III chronic kidney disease reports increased right sided weakness on 10/19/20. She was diagnosed with left basal ganglia ischemic stroke. She was discharged to inpatient rehab for 10 days and then discharged home. Patient was evaluated by outpatient PT on 11/12/20. CVA was due to carotid stenosis. She underwent left carotid endartectomy on 7/21. Procedure went  well however patient suffered from left cervical hematoma and had subsequent surgical procedure to stop the bleed and remove the hematoma on 11/13/20. They were concerned with her breathing and therefore intubated her for 1 day, she was in ICU for 2 days and transferred to med/surge on 11/15/20. Patient was discharged home on 11/17/20 and is now returning to outpatient PT. She has had the stiches removed and is healing well. Denies any neck pain or stiffness. She lives with her daughter and has 24/7 caregiver support. She is still using RW for most ambulation. She is able to transfer to bedside commode well and is able  to stand short time unsupported. She reports feeling very weak and fatigued. She reports she has been dragging her right foot more. She denies any numbness/tingling; She reports feeling like her legs are wanting to do more but is hesitant because of fear of falling. She is mod I for self care morning routine. She does still receive help with showering. She has been working on increasing her activity but denies any formal exercise.    Limitations Standing;Walking    How long can you sit comfortably? NA    How long can you stand comfortably? 30-60 min with support    How long can you walk comfortably? about 100 feet with RW, still limited to short distances;    Diagnostic tests MRI shows left basal ganglia infarct 10/21/20    Patient Stated Goals "Improve balance and walking and not need RW, get back to independent."    Currently in Pain? No/denies    Pain Onset In the past 7 days                [facility] PT Assessment - 04/28/21 0001       Observation/Other Assessments   Focus on Therapeutic Outcomes (FOTO)  61      6 Minute walk- Post Test   6 Minute Walk Post Test yes    HR (bpm) 84      6 minute walk test results    Aerobic Endurance Distance Walked 475    Endurance additional comments with RW, limited community ambulator      Standardized Balance Assessment   Five times sit to stand comments  33.92 without UE use, >15 sec indicates high risk for falls,no significant change from 12/7 which was 33.2 sec    10 Meter Walk 0.704 m/s with RW, improved from 0.5 m/s on 12/7 with RW              TREATMENT: Instructed patient in outcome measures, see above  Patient does exhibit improvement in 10 meter walk speed, but no significant change in 6 min walk test. However its important to note that the last time she completed the 6 min walk test she was using the rollator and used a RW this session which could contribute to small variance in outcome;  Patient instructed in balance  exercise: Standing unsupported: Feet staggered, no instability, good postural control Patient hesitant to turn around exhibiting good head turn but minimal trunk rotation while unsupported  Standing on firm surface: -alternate toe tap to 6 inch step unsupported x10 reps, min A for safety with increased right foot drag and increased instability;  She was instructed in taking a step on level surface unsupported, and holding position- able to hold for approximately 15 sec with mild instability noted;  Patient tolerated session well. She reports trying to be more independent at home but is still very careful  to avoid falls.                      PT Education - 04/28/21 1151     Education Details exercise technique/technique with tripod base cane/body mechanics;    Person(s) Educated Patient    Methods Explanation;Verbal cues    Comprehension Verbalized understanding;Returned demonstration;Verbal cues required;Need further instruction              PT Short Term Goals - 04/28/21 1152       PT SHORT TERM GOAL #1   Title Patient will be adherent to HEP at least 3x a week to improve functional strength and balance for better safety at home.    Baseline 9/21: Pt reports compliance with HEP and is confident, 1/3: doing exercise 2x a week    Time 4    Period Weeks    Status Partially Met    Target Date 05/26/21      PT SHORT TERM GOAL #2   Title Patient will be independent in transferring sit<>Stand without pushing on arm rests to improve ability to get up from chair.    Baseline 9/21: pt able to complete STS without use of UEs, 1/3:able to stand but has moderate difficulty requiring increased time;    Time 4    Period Weeks    Status Partially Met    Target Date 05/26/21               PT Long Term Goals - 04/28/21 1153       PT LONG TERM GOAL #1   Title Patient (> 38 years old) will complete five times sit to stand test in < 15 seconds indicating an  increased LE strength and improved balance.    Baseline 9/21: 29 seconds hands-free 10/10: deferred 10/12: 34 sec hands free; 11/2: 23.8 sec hands-free; 12/7: 33.2 sec hands free, 1/3: 33.92 hands free    Time 8    Period Weeks    Status On-going    Target Date 06/23/21      PT LONG TERM GOAL #2   Title Patient will increase six minute walk test distance to >400 feet for improve gait ability    Baseline 9/21: 368 ft with RW; 10/10: deferred 10/12: 408 ft; 11/2: 476 ft with 4WW, 1/3: 475 feet with RW    Time 8    Period Weeks    Status Achieved    Target Date 06/23/21      PT LONG TERM GOAL #3   Title Patient will increase 10 meter walk test to >1.26ms as to improve gait speed for better community ambulation and to reduce fall risk.    Baseline 9/21: 0.5 m/s with RW; 10/10 deferred 10/12: 0.93 m/s with 43YY 11/2: 0.83 m/s with 45RT 12/7: 0.52 m/s with RW, 1/3: 0.704    Time 8    Period Weeks    Status Partially Met    Target Date 06/23/21      PT LONG TERM GOAL #4   Title Patient will be independent with ascend/descend 6 steps using single UE in step over step pattern without LOB.    Baseline 9/21:  Patient uses PT stairs (4 steps) with bilateral upper extremity support on handrails for both ascending/descending.  Patient exhibits reciprocal pattern ascending and step to pattern descending. 10/10: deferred; 10/12: ascending/descending recip. steps with BUE support; 11/2: Ascended/descended 8 steps total, reciprocal pattern ascending and step-to descending. Pt uses UUE support throughout with  CGA assist.; 12/7: Ascended and descended 8 steps total with UUE support and CGA. Reciprocal stepping ascending and step to descending., 1/3: Deferrred    Time 8    Period Weeks    Status Partially Met    Target Date 06/23/21      PT LONG TERM GOAL #5   Title Patient will demonstrate an improved Berg Balance Score of >45/56 as to demonstrate improved balance with ADLs such as sitting/standing and  transfer balance and reduced fall risk.    Baseline 9/21: deferred d/t time; 9/29: 42/56; 10/10: deferred; 10/12: 44/56; 11/2: deferred; 11/7: 46/56, 1/3: deferred    Time 8    Period Weeks    Status Achieved    Target Date 06/23/21      PT LONG TERM GOAL #6   Title Patient will improve FOTO score to >60% to indicate improved functional mobility with ADLs.    Baseline 9/21: 59; 10/10: 57; 11/2: 60; 12/7: 58%, 1/3: 61%    Time 8    Period Weeks    Status Achieved    Target Date 06/23/21                   Plan - 04/28/21 1329     Clinical Impression Statement Patient motivated and participated well within session. She was instructed in outcome measures to address progress towards goals. Patient continues to have difficulty with sit<>Stand with increased weakness in BLE. She is able to transfer without pushing on arm rest but requires increased time and effort. Patient is walking fair with RW, exhibiting slower gait speed and increased RLE foot drag. She does exhibit improvement in gait speed but no significant change on 6 min walk test. She does have difficulty with dynamic balance unsupported requiring min A for safety. Patient would benefit from additional skilled PT Intervention to improve strength, balance and gait safety; Plan to continue strengthening program and start working more on balance. Plan to advance HEP next visit;    Personal Factors and Comorbidities Age    Comorbidities HTN, CKD, fall risk, type 2 diabetes,    Examination-Activity Limitations Lift;Locomotion Level;Squat;Stairs;Stand;Toileting;Transfers;Bathing    Examination-Participation Restrictions Cleaning;Community Activity;Laundry;Meal Prep;Shop;Volunteer;Driving    Stability/Clinical Decision Making Stable/Uncomplicated    Rehab Potential Poor    PT Frequency 2x / week    PT Duration 8 weeks    PT Treatment/Interventions Cryotherapy;Electrical Stimulation;Moist Heat;Gait training;Stair training;Functional  mobility training;Therapeutic activities;Therapeutic exercise;Neuromuscular re-education;Balance training;Patient/family education;Orthotic Fit/Training;Energy conservation    PT Next Visit Plan work on LE strengthening and balance; gait training, endurance, continue POC as previously indicated    PT Home Exercise Plan Access Code: [email or web address]; no updates    Consulted and Agree with Plan of Care Patient             Patient will benefit from skilled therapeutic intervention in order to improve the following deficits and impairments:  Abnormal gait, Decreased balance, Decreased endurance, Decreased mobility, Difficulty walking, Cardiopulmonary status limiting activity, Decreased activity tolerance, Decreased coordination, Decreased strength  Visit Diagnosis: Muscle weakness (generalized)  Other lack of coordination  Other abnormalities of gait and mobility  Unsteadiness on feet  Difficulty in walking, not elsewhere classified     Problem List Patient Active Problem List   Diagnosis Date Noted   Carotid stenosis, symptomatic, with infarction ([place]) 11/13/2020   Gout flare 11/10/2020   UTI (urinary tract infection) 11/10/2020   Left carotid artery stenosis 11/10/2020   Chronic kidney disease 11/10/2020   Left basal ganglia embolic  stroke ([place]) 10/24/2020   PAC (premature atrial contraction)    Pre-procedural cardiovascular examination    Ischemic stroke ([place]) 10/20/2020   TIA (transient ischemic attack) 10/19/2020   Right hemiparesis ([place]) 10/19/2020   Type 2 diabetes mellitus with stage 3 chronic kidney disease, without long-term current use of insulin ([place]) 08/24/2018   Hyperlipidemia, unspecified 07/19/2017   Hypertension 07/19/2017   PUD (peptic ulcer disease) 07/19/2017   Type 2 diabetes mellitus ([place]) 07/19/2017   Personal history of gout 08/12/2016   Anemia, unspecified 04/09/2016   Hypothyroidism, acquired 12/10/2015    Zanetta Dehaan, PT, DPT 04/28/2021, 1:38  PM  [facility] [address] [place], [place], [address] Phone: [phone]   Fax:  [phone]  Name: Cassandra Schaefer MRN: [id number] Date of Birth: 05-01-1928

## 2021-04-30 ENCOUNTER — Other Ambulatory Visit: Payer: Self-pay

## 2021-04-30 ENCOUNTER — Ambulatory Visit: Payer: Medicare HMO | Admitting: Physical Therapy

## 2021-04-30 ENCOUNTER — Encounter: Payer: Medicare HMO | Admitting: Speech Pathology

## 2021-04-30 ENCOUNTER — Encounter: Payer: Self-pay | Admitting: Occupational Therapy

## 2021-04-30 ENCOUNTER — Encounter: Payer: Self-pay | Admitting: Physical Therapy

## 2021-04-30 ENCOUNTER — Ambulatory Visit: Payer: Medicare HMO | Admitting: Occupational Therapy

## 2021-04-30 DIAGNOSIS — R2681 Unsteadiness on feet: Secondary | ICD-10-CM

## 2021-04-30 DIAGNOSIS — M6281 Muscle weakness (generalized): Secondary | ICD-10-CM | POA: Diagnosis not present

## 2021-04-30 DIAGNOSIS — R278 Other lack of coordination: Secondary | ICD-10-CM

## 2021-04-30 DIAGNOSIS — R262 Difficulty in walking, not elsewhere classified: Secondary | ICD-10-CM

## 2021-04-30 DIAGNOSIS — R2689 Other abnormalities of gait and mobility: Secondary | ICD-10-CM

## 2021-04-30 NOTE — Patient Instructions (Signed)
Access Code: [email or web address] URL: [email or web address] Date: 04/30/2021 Prepared by: Zettie Pho  Exercises Forward Step Up - 1 x daily - 5 x weekly - 1 sets - 10 reps Standing Heel Raise with Support - 1 x daily - 5 x weekly - 1 sets - 10 reps - 3 sec hold Standing Hip Abduction with Resistance at Ankles and Counter Support - 1 x daily - 5 x weekly - 1 sets - 10 reps Standing Hip Extension with Resistance at Ankles and Unilateral Counter Support - 1 x daily - 5 x weekly - 1 sets - 10 reps Side Stepping with Resistance at Ankles and Counter Support - 1 x daily - 5 x weekly - 1 sets - 2 reps

## 2021-04-30 NOTE — Therapy (Signed)
[place] MAIN [facility] SERVICES [address] [place], [place], [address] Phone: [phone]   Fax:  [phone]  Physical Therapy Treatment  Patient Details  Name: Cassandra Schaefer MRN: [id number] Date of Birth: May 04, 1928 Referring Provider (PT): Dr. Dew/Dr. Ginette Pitman PCP   Encounter Date: 04/30/2021   PT End of Session - 04/30/21 1150     Visit Number 37    Number of Visits 63    Date for PT Re-Evaluation 06/23/21    Authorization Type [employer]    Authorization Time Period 02/02/21-04/27/21    Progress Note Due on Visit 10    PT Start Time 1146    PT Stop Time 1228    PT Time Calculation (min) 42 min    Equipment Utilized During Treatment Gait belt    Activity Tolerance Patient tolerated treatment well    Behavior During Therapy WFL for tasks assessed/performed             Past Medical History:  Diagnosis Date   Anemia    Cough    RESOLVING, NO FEVER   Diabetes mellitus without complication ([place])    Gout    Hypertension    Hypothyroidism    PUD (peptic ulcer disease)    Stroke [facility])    Wears dentures    full upper, partial lower    Past Surgical History:  Procedure Laterality Date   AMPUTATION TOE Left 12/14/2017   Procedure: AMPUTATION TOE-4TH MPJ;  Surgeon: Samara Deist, DPM;  Location: [place];  Service: Podiatry;  Laterality: Left;  IVA LOCAL Diabetic - oral meds   APPENDECTOMY     CATARACT EXTRACTION W/PHACO Right 06/23/2015   Procedure: CATARACT EXTRACTION PHACO AND INTRAOCULAR LENS PLACEMENT (IOC);  Surgeon: Estill Cotta, MD;  Location: [facility];  Service: Ophthalmology;  Laterality: Right;  [place] 01:28 AP% 23.4 CDE 36.14 fluid pack lot # [id number]   CATARACT EXTRACTION W/PHACO Left 07/28/2015   Procedure: CATARACT EXTRACTION PHACO AND INTRAOCULAR LENS PLACEMENT (IOC);  Surgeon: Estill Cotta, MD;  Location: [facility];  Service: Ophthalmology;  Laterality: Left;  [place]    1:29.7 AP%  24.9 CDE    41.32 fluid casette lot #[id number] H exp 09/23/2016   COONOSCOPY     AND ENDOSCOPY   DILATION AND CURETTAGE OF UTERUS     ENDARTERECTOMY Left 11/13/2020   Procedure: Evacuation of left neck hematoma;  Surgeon: Katha Cabal, MD;  Location: [facility];  Service: Vascular;  Laterality: Left;   ENDARTERECTOMY Left 11/13/2020   Procedure: ENDARTERECTOMY CAROTID;  Surgeon: Algernon Huxley, MD;  Location: [facility];  Service: Vascular;  Laterality: Left;   TONSILLECTOMY     TUBAL LIGATION      There were no vitals filed for this visit.   Subjective Assessment - 04/30/21 1149     Subjective Patient reports doing well. no pain; No new falls    Patient is accompained by: --   Daughter, Cassandra Schaefer   Pertinent History 88 y.o. female with medical history significant for type 2 diabetes mellitus, essential hypertension, acquired hypothyroidism, hyperlipidemia, stage III chronic kidney disease reports increased right sided weakness on 10/19/20. She was diagnosed with left basal ganglia ischemic stroke. She was discharged to inpatient rehab for 10 days and then discharged home. Patient was evaluated by outpatient PT on 11/12/20. CVA was due to carotid stenosis. She underwent left carotid endartectomy on 7/21. Procedure went well however patient suffered from left cervical hematoma and had subsequent surgical procedure to  stop the bleed and remove the hematoma on 11/13/20. They were concerned with her breathing and therefore intubated her for 1 day, she was in ICU for 2 days and transferred to med/surge on 11/15/20. Patient was discharged home on 11/17/20 and is now returning to outpatient PT. She has had the stiches removed and is healing well. Denies any neck pain or stiffness. She lives with her daughter and has 24/7 caregiver support. She is still using RW for most ambulation. She is able to transfer to bedside commode well and is able to stand short time unsupported. She reports feeling very weak and fatigued. She reports  she has been dragging her right foot more. She denies any numbness/tingling; She reports feeling like her legs are wanting to do more but is hesitant because of fear of falling. She is mod I for self care morning routine. She does still receive help with showering. She has been working on increasing her activity but denies any formal exercise.    Limitations Standing;Walking    How long can you sit comfortably? NA    How long can you stand comfortably? 30-60 min with support    How long can you walk comfortably? about 100 feet with RW, still limited to short distances;    Diagnostic tests MRI shows left basal ganglia infarct 10/21/20    Patient Stated Goals "Improve balance and walking and not need RW, get back to independent."    Currently in Pain? No/denies    Pain Onset In the past 7 days              TREATMENT:     INTERVENTIONS - gait belt donned and CGA used with all upright interventions unless otherwise specified     Ambulation with hurrycane and Min A to CGA x80 feet with cues for sequencing and coordination. Initially patient exhibits a 3 point gait pattern. With increased practice, she was able to achieve periods of 2 point pattern but continues to ambulate with uneven step length and cadence;   Instructed patient in HEP: Forward step ups with 2 rail assist x10 reps Standing heel/toe raises 3 sec hold x10 reps with BUE rail assist;  Standing with red tband around BLE: -hip abduction x10 reps -hip extension x10 reps; -side stepping x5 feet x2 laps Patient required min-moderate verbal/tactile cues for correct exercise technique.   Instructed patient in balance exercise: Standing on airex: -feet apart unsupported x30 sec, no sway good support  Progressed with lateral head turns x5 reps  Progressed with vertical head turns x5 reps, no imbalance noted -alternate toe taps to 6 inch step with 1 rail assist x10 reps; Patient required min A for safety with increased toe  drag/difficulty clearing RLE foot -standing one foot on airex, one foot on 6 inch step:  Unsupported x10 sec, no instability  Unsupported with head turns side/side x5 reps with CGA to min A and mild instability noted -standing on airex with feet apart:  Bending down and picking up stepping stones #3 and then placing them back on step #3, requiring min A with increased instability noted; Patient reports having a lot of difficulty picking up items and is wanting to improve that ability for safety in the home.  Standing on airex: Modified tandem stance: unsupported 10 sec hold x1 rep each, slight sway but able to keep balance with close supervision    Pt educated throughout session about proper posture and technique with exercises. Improved exercise technique, movement at target joints,  use of target muscles after min to mod verbal, visual, tactile cues.  Patient tolerated session well. She does report increased fatigue at end of session. Patient pleased with new HEP                      PT Education - 04/30/21 1150     Education Details exercise technique/HEP    Person(s) Educated Patient    Methods Explanation;Verbal cues    Comprehension Verbalized understanding;Returned demonstration;Verbal cues required;Need further instruction              PT Short Term Goals - 04/28/21 1152       PT SHORT TERM GOAL #1   Title Patient will be adherent to HEP at least 3x a week to improve functional strength and balance for better safety at home.    Baseline 9/21: Pt reports compliance with HEP and is confident, 1/3: doing exercise 2x a week    Time 4    Period Weeks    Status Partially Met    Target Date 05/26/21      PT SHORT TERM GOAL #2   Title Patient will be independent in transferring sit<>Stand without pushing on arm rests to improve ability to get up from chair.    Baseline 9/21: pt able to complete STS without use of UEs, 1/3:able to stand but has moderate  difficulty requiring increased time;    Time 4    Period Weeks    Status Partially Met    Target Date 05/26/21               PT Long Term Goals - 04/28/21 1153       PT LONG TERM GOAL #1   Title Patient (> 63 years old) will complete five times sit to stand test in < 15 seconds indicating an increased LE strength and improved balance.    Baseline 9/21: 29 seconds hands-free 10/10: deferred 10/12: 34 sec hands free; 11/2: 23.8 sec hands-free; 12/7: 33.2 sec hands free, 1/3: 33.92 hands free    Time 8    Period Weeks    Status On-going    Target Date 06/23/21      PT LONG TERM GOAL #2   Title Patient will increase six minute walk test distance to >400 feet for improve gait ability    Baseline 9/21: 368 ft with RW; 10/10: deferred 10/12: 408 ft; 11/2: 476 ft with 4WW, 1/3: 475 feet with RW    Time 8    Period Weeks    Status Achieved    Target Date 06/23/21      PT LONG TERM GOAL #3   Title Patient will increase 10 meter walk test to >1.14ms as to improve gait speed for better community ambulation and to reduce fall risk.    Baseline 9/21: 0.5 m/s with RW; 10/10 deferred 10/12: 0.93 m/s with 49QZ 11/2: 0.83 m/s with 40SP 12/7: 0.52 m/s with RW, 1/3: 0.704    Time 8    Period Weeks    Status Partially Met    Target Date 06/23/21      PT LONG TERM GOAL #4   Title Patient will be independent with ascend/descend 6 steps using single UE in step over step pattern without LOB.    Baseline 9/21:  Patient uses PT stairs (4 steps) with bilateral upper extremity support on handrails for both ascending/descending.  Patient exhibits reciprocal pattern ascending and step to pattern descending. 10/10: deferred; 10/12:  ascending/descending recip. steps with BUE support; 11/2: Ascended/descended 8 steps total, reciprocal pattern ascending and step-to descending. Pt uses UUE support throughout with CGA assist.; 12/7: Ascended and descended 8 steps total with UUE support and CGA. Reciprocal  stepping ascending and step to descending., 1/3: Deferrred    Time 8    Period Weeks    Status Partially Met    Target Date 06/23/21      PT LONG TERM GOAL #5   Title Patient will demonstrate an improved Berg Balance Score of >45/56 as to demonstrate improved balance with ADLs such as sitting/standing and transfer balance and reduced fall risk.    Baseline 9/21: deferred d/t time; 9/29: 42/56; 10/10: deferred; 10/12: 44/56; 11/2: deferred; 11/7: 46/56, 1/3: deferred    Time 8    Period Weeks    Status Achieved    Target Date 06/23/21      PT LONG TERM GOAL #6   Title Patient will improve FOTO score to >60% to indicate improved functional mobility with ADLs.    Baseline 9/21: 59; 10/10: 57; 11/2: 60; 12/7: 58%, 1/3: 61%    Time 8    Period Weeks    Status Achieved    Target Date 06/23/21                   Plan - 04/30/21 1351     Clinical Impression Statement Patient motivated and participated well within session. She was instructed in advanced HEP for LE strengthening. Patient exhibits good positioning and safety. She verbalized and demonstrated understanding. Patient able to safely don/doff tband independently. Patient does report increased fatigue with standing exercise with tband. She was instructed in advanced balance exercise, utilizing airex pad to challenge stance control. Patient does have increased difficulty reaching outside base of support. She required min A for safety. Patient would benefit from additional skilled PT Intervention to improve strength, balance and mobility;    Personal Factors and Comorbidities Age    Comorbidities HTN, CKD, fall risk, type 2 diabetes,    Examination-Activity Limitations Lift;Locomotion Level;Squat;Stairs;Stand;Toileting;Transfers;Bathing    Examination-Participation Restrictions Cleaning;Community Activity;Laundry;Meal Prep;Shop;Volunteer;Driving    Stability/Clinical Decision Making Stable/Uncomplicated    Rehab Potential Poor     PT Frequency 2x / week    PT Duration 8 weeks    PT Treatment/Interventions Cryotherapy;Electrical Stimulation;Moist Heat;Gait training;Stair training;Functional mobility training;Therapeutic activities;Therapeutic exercise;Neuromuscular re-education;Balance training;Patient/family education;Orthotic Fit/Training;Energy conservation    PT Next Visit Plan work on LE strengthening and balance; gait training, endurance, continue POC as previously indicated    PT Home Exercise Plan Access Code: [email or web address]; no updates    Consulted and Agree with Plan of Care Patient             Patient will benefit from skilled therapeutic intervention in order to improve the following deficits and impairments:  Abnormal gait, Decreased balance, Decreased endurance, Decreased mobility, Difficulty walking, Cardiopulmonary status limiting activity, Decreased activity tolerance, Decreased coordination, Decreased strength  Visit Diagnosis: Muscle weakness (generalized)  Other lack of coordination  Other abnormalities of gait and mobility  Unsteadiness on feet  Difficulty in walking, not elsewhere classified     Problem List Patient Active Problem List   Diagnosis Date Noted   Carotid stenosis, symptomatic, with infarction ([place]) 11/13/2020   Gout flare 11/10/2020   UTI (urinary tract infection) 11/10/2020   Left carotid artery stenosis 11/10/2020   Chronic kidney disease 11/10/2020   Left basal ganglia embolic stroke ([place]) 15/40/0867   PAC (premature atrial contraction)  Pre-procedural cardiovascular examination    Ischemic stroke ([place]) 10/20/2020   TIA (transient ischemic attack) 10/19/2020   Right hemiparesis ([place]) 10/19/2020   Type 2 diabetes mellitus with stage 3 chronic kidney disease, without long-term current use of insulin ([place]) 08/24/2018   Hyperlipidemia, unspecified 07/19/2017   Hypertension 07/19/2017   PUD (peptic ulcer disease) 07/19/2017   Type 2 diabetes mellitus ([place])  07/19/2017   Personal history of gout 08/12/2016   Anemia, unspecified 04/09/2016   Hypothyroidism, acquired 12/10/2015    Lavante Toso, PT, DPT 04/30/2021, 2:11 PM  [place] [address] [place], [place], [address] Phone: [phone]   Fax:  [phone]  Name: Keilynn Marano MRN: [id number] Date of Birth: 09-29-1928

## 2021-04-30 NOTE — Therapy (Signed)
[place] MAIN [facility] SERVICES [address] [place], [place], [address] Phone: [phone]   Fax:  [phone]  Occupational Therapy Progress Note  Dates of reporting period  02/16/2021   to  04/30/2021   Patient Details  Name: Cassandra Schaefer MRN: [id number] Date of Birth: 1928/06/25 Referring Provider (OT): Dr. Tracie Harrier   Encounter Date: 04/30/2021   OT End of Session - 04/30/21 1109     Visit Number 40    Number of Visits 24    Date for OT Re-Evaluation 07/14/21    Authorization Time Period Reporting period starting 02/16/2021    OT Start Time 1105    OT Stop Time 1145    OT Time Calculation (min) 40 min    Activity Tolerance Patient tolerated treatment well    Behavior During Therapy WFL for tasks assessed/performed             Past Medical History:  Diagnosis Date   Anemia    Cough    RESOLVING, NO FEVER   Diabetes mellitus without complication ([place])    Gout    Hypertension    Hypothyroidism    PUD (peptic ulcer disease)    Stroke [facility])    Wears dentures    full upper, partial lower    Past Surgical History:  Procedure Laterality Date   AMPUTATION TOE Left 12/14/2017   Procedure: AMPUTATION TOE-4TH MPJ;  Surgeon: Samara Deist, DPM;  Location: [place];  Service: Podiatry;  Laterality: Left;  IVA LOCAL Diabetic - oral meds   APPENDECTOMY     CATARACT EXTRACTION W/PHACO Right 06/23/2015   Procedure: CATARACT EXTRACTION PHACO AND INTRAOCULAR LENS PLACEMENT (IOC);  Surgeon: Estill Cotta, MD;  Location: [facility];  Service: Ophthalmology;  Laterality: Right;  [place] 01:28 AP% 23.4 CDE 36.14 fluid pack lot # [id number]   CATARACT EXTRACTION W/PHACO Left 07/28/2015   Procedure: CATARACT EXTRACTION PHACO AND INTRAOCULAR LENS PLACEMENT (IOC);  Surgeon: Estill Cotta, MD;  Location: [facility];  Service: Ophthalmology;  Laterality: Left;  [place]    1:29.7 AP%  24.9 CDE   41.32 fluid casette lot [id number] H exp  09/23/2016   COONOSCOPY     AND ENDOSCOPY   DILATION AND CURETTAGE OF UTERUS     ENDARTERECTOMY Left 11/13/2020   Procedure: Evacuation of left neck hematoma;  Surgeon: Katha Cabal, MD;  Location: [facility];  Service: Vascular;  Laterality: Left;   ENDARTERECTOMY Left 11/13/2020   Procedure: ENDARTERECTOMY CAROTID;  Surgeon: Algernon Huxley, MD;  Location: [facility];  Service: Vascular;  Laterality: Left;   TONSILLECTOMY     TUBAL LIGATION      There were no vitals filed for this visit.   Subjective Assessment - 04/30/21 1108     Subjective  Pt. reports that her cold is feeling better.    Patient is accompanied by: Family member    Pertinent History R ankle pain, gout, T2DM, HTN, CKDIII; R/L carotid endarectomy    Currently in Pain? No/denies            Measurements were obtained, and goals were reviewed with the pt. Pt. is progressing with RUE functioning, and is now using her right hand more during ADL, and IADL tasks. Pt. has improved with writing skills, and writing legibility. Pt. is now using her right hand to engage more during ADLs, and IADL tasks at home. Pt. presents with impaired right grip strength, pinch strength, and [place] skills. Pt. presents with limited  right UE motor control evident when reaching, and grasping objects overshooting, and undershooting targets. Pt.'s FOTO score improved this progress reporting period back up to 61. Pt. Continues to work on improving RUE functioning in order to improve strength, motor control, and coordination during ADLs, and IADL tasks.                       OT Education - 04/30/21 1109     Education Details RUE functioning.    Person(s) Educated Patient    Methods Explanation;Verbal cues;Demonstration;Tactile cues    Comprehension Verbalized understanding;Verbal cues required;Returned demonstration              OT Short Term Goals - 03/23/21 1657       OT SHORT TERM GOAL #1   Title Pt will perform HEP for  RUE strength/coordination independently.    Baseline Eval: HEP not yet established in outpatient, but pt is working with putty from [name]; 12/04/2020; [facility] HEP initiated this day with mod vc after return demo; 01/05/2021: indep with currently program, but ongoing as pt progresses; 01/28/2021: vc to revisit and focus on grip and pinch strengthening with putty; 03/23/21: pt is indep with HEP    Time 6    Period Weeks    Status Achieved    Target Date 03/23/21               OT Long Term Goals - 04/23/21 1727       OT LONG TERM GOAL #1   Title Pt will improve hand writing to 100% legibility with R hand to be able to independently write a check.    Baseline Eval: signature is 75% legible with R hand, requiring extra time; 12/04/2020: no change from eval; 01/05/2021: Printing is 100% legible, signature is 90% legible, but still requires extra time and effort.; 01/26/21: Signature is 90% legible and speed/fluidity have improved. 20th visit: 90% legible, 03/23/21: 90% legible, extra time    Time 12    Period Weeks    Status On-going    Target Date 07/14/21      OT LONG TERM GOAL #3   Title Pt will improve GMC throughout RUE to enable pt to reach for and pick up ADL supplies with R dominant hand without dropping or knocking over objects.    Baseline Eval: Pt reports RUE is clumsy and easily knocks over objects when trying to reach for ADL supplies.  Pt verbalizes that her "aim is off."; 12/04/2020: pt reports slight improvement since eval and has started using her R hand to eat, but still feels clumsy; 01/05/2021: Greatly improved; pt is using silverware to eat with R hand, can comb hair with RUE, still mildly ataxic; 01/26/21: pt reports difficulty picking up objects within a small space (ie; may knock the toothpaste holder down when reaching for toothbrush), but reaching for light/larger objects is improving 20th visit: pt. continues to be mildly ataxic, however is consistently improving with motor control,  and [place] while reaching targets; 03/23/21: Pt reaches with accuracy towards targets if she takes her time (pt drops and knocks things over when moving at a faster/normal pace) 04/23/2021: Pt. continues to present with difficulty with depth perception, and impaired [place].    Time 12    Period Weeks    Status On-going    Target Date 07/14/21      OT LONG TERM GOAL #4   Title Pt will improve FOTO score to 68 or better to indicate  measurable functional improvement.    Baseline Eval: FOTO 55; 01/05/2021: FOTO 61; 01/28/21: FOTO 61, 20th visit: 61; 03/23/21: FOTO 60    Time 12    Period Weeks    Status On-going    Target Date 07/14/21                   Plan - 04/30/21 1110     Clinical Impression Statement Measurements were obtained, and goals were reviewed with the pt. Pt. is progressing with RUE functioning, and is now using her right hand more during ADL, and IADL tasks. Pt. has improved with writing skills, and writing legibility. Pt. is now using her right hand to engage more during ADLs, and IADL tasks at home. Pt. presents with impaired right grip strength, pinch strength, and [place] skills. Pt. presents with limited right UE motor control evident when reaching, and grasping objects overshooting, and undershooting targets. Pt.'s FOTO score improved this progress reporting period back up to 61. Pt. Continues to work on improving RUE functioning in order to improve strength, motor control, and coordination during ADLs, and IADL tasks.   OT Occupational Profile and History Detailed Assessment- Review of Records and additional review of physical, cognitive, psychosocial history related to current functional performance    Occupational performance deficits (Please refer to evaluation for details): ADL's;Leisure;IADL's    Body Structure / Function / Physical Skills ADL;Coordination;Endurance;GMC;UE functional use;Balance;Sensation;Body mechanics;IADL;Pain;Dexterity;FMC;Strength;Gait;Mobility     Rehab Potential Good    Clinical Decision Making Several treatment options, min-mod task modification necessary    Comorbidities Affecting Occupational Performance: May have comorbidities impacting occupational performance    Modification or Assistance to Complete Evaluation  Min-Moderate modification of tasks or assist with assess necessary to complete eval    OT Frequency 2x / week    OT Duration 12 weeks    OT Treatment/Interventions Self-care/ADL training;Therapeutic exercise;DME and/or AE instruction;Functional Mobility Training;Balance training;Neuromuscular education;Therapeutic activities;Patient/family education    Consulted and Agree with Plan of Care Patient             Patient will benefit from skilled therapeutic intervention in order to improve the following deficits and impairments:   Body Structure / Function / Physical Skills: ADL, Coordination, Endurance, GMC, UE functional use, Balance, Sensation, Body mechanics, IADL, Pain, Dexterity, FMC, Strength, Gait, Mobility       Visit Diagnosis: Muscle weakness (generalized)  Other lack of coordination    Problem List Patient Active Problem List   Diagnosis Date Noted   Carotid stenosis, symptomatic, with infarction ([place]) 11/13/2020   Gout flare 11/10/2020   UTI (urinary tract infection) 11/10/2020   Left carotid artery stenosis 11/10/2020   Chronic kidney disease 11/10/2020   Left basal ganglia embolic stroke ([place]) [id number]   PAC (premature atrial contraction)    Pre-procedural cardiovascular examination    Ischemic stroke ([place]) 10/20/2020   TIA (transient ischemic attack) 10/19/2020   Right hemiparesis ([place]) 10/19/2020   Type 2 diabetes mellitus with stage 3 chronic kidney disease, without long-term current use of insulin ([place]) 08/24/2018   Hyperlipidemia, unspecified 07/19/2017   Hypertension 07/19/2017   PUD (peptic ulcer disease) 07/19/2017   Type 2 diabetes mellitus ([place]) 07/19/2017   Personal history  of gout 08/12/2016   Anemia, unspecified 04/09/2016   Hypothyroidism, acquired 12/10/2015    Harrel Carina, MS, OTR/L 04/30/2021, 11:12 AM  [place], [place], [address] Phone: [phone]   Fax:  [phone]  Name: Marcie Bal  Charvette Krenek MRN: [id number] Date of Birth: 04-06-1929

## 2021-05-05 ENCOUNTER — Encounter: Payer: Medicare HMO | Admitting: Speech Pathology

## 2021-05-05 ENCOUNTER — Ambulatory Visit: Payer: Medicare HMO | Admitting: Occupational Therapy

## 2021-05-05 ENCOUNTER — Ambulatory Visit: Payer: Medicare HMO | Admitting: Physical Therapy

## 2021-05-07 ENCOUNTER — Encounter: Payer: Medicare HMO | Admitting: Occupational Therapy

## 2021-05-07 ENCOUNTER — Encounter: Payer: Medicare HMO | Admitting: Speech Pathology

## 2021-05-07 ENCOUNTER — Ambulatory Visit: Payer: Medicare HMO | Admitting: Physical Therapy

## 2021-05-12 ENCOUNTER — Other Ambulatory Visit: Payer: Self-pay

## 2021-05-12 ENCOUNTER — Encounter: Payer: Self-pay | Admitting: Occupational Therapy

## 2021-05-12 ENCOUNTER — Encounter: Payer: Medicare HMO | Admitting: Speech Pathology

## 2021-05-12 ENCOUNTER — Ambulatory Visit: Payer: Medicare HMO | Admitting: Occupational Therapy

## 2021-05-12 ENCOUNTER — Ambulatory Visit: Payer: Medicare HMO

## 2021-05-12 DIAGNOSIS — R278 Other lack of coordination: Secondary | ICD-10-CM

## 2021-05-12 DIAGNOSIS — R2689 Other abnormalities of gait and mobility: Secondary | ICD-10-CM

## 2021-05-12 DIAGNOSIS — R2681 Unsteadiness on feet: Secondary | ICD-10-CM

## 2021-05-12 DIAGNOSIS — M6281 Muscle weakness (generalized): Secondary | ICD-10-CM | POA: Diagnosis not present

## 2021-05-12 NOTE — Therapy (Signed)
[place] MAIN [facility] SERVICES [address] [place], [place], [address] Phone: [phone]   Fax:  [phone]  Physical Therapy Treatment  Patient Details  Name: Cassandra Schaefer MRN: [id number] Date of Birth: 10/10/1928 Referring Provider (PT): Dr. Dew/Dr. Ginette Pitman PCP   Encounter Date: 05/12/2021   PT End of Session - 05/12/21 1154     Visit Number 38    Number of Visits 25    Date for PT Re-Evaluation 06/23/21    Authorization Type [employer]    Authorization Time Period 02/02/21-04/27/21    Progress Note Due on Visit 67    PT Start Time 1103    PT Stop Time 1145    PT Time Calculation (min) 42 min    Equipment Utilized During Treatment Gait belt    Activity Tolerance Patient tolerated treatment well    Behavior During Therapy WFL for tasks assessed/performed             Past Medical History:  Diagnosis Date   Anemia    Cough    RESOLVING, NO FEVER   Diabetes mellitus without complication ([place])    Gout    Hypertension    Hypothyroidism    PUD (peptic ulcer disease)    Stroke [facility])    Wears dentures    full upper, partial lower    Past Surgical History:  Procedure Laterality Date   AMPUTATION TOE Left 12/14/2017   Procedure: AMPUTATION TOE-4TH MPJ;  Surgeon: Samara Deist, DPM;  Location: [place];  Service: Podiatry;  Laterality: Left;  IVA LOCAL Diabetic - oral meds   APPENDECTOMY     CATARACT EXTRACTION W/PHACO Right 06/23/2015   Procedure: CATARACT EXTRACTION PHACO AND INTRAOCULAR LENS PLACEMENT (IOC);  Surgeon: Estill Cotta, MD;  Location: [facility];  Service: Ophthalmology;  Laterality: Right;  [place] 01:28 AP% 23.4 CDE 36.14 fluid pack lot # [id number]   CATARACT EXTRACTION W/PHACO Left 07/28/2015   Procedure: CATARACT EXTRACTION PHACO AND INTRAOCULAR LENS PLACEMENT (IOC);  Surgeon: Estill Cotta, MD;  Location: [facility];  Service: Ophthalmology;  Laterality: Left;  [place]    1:29.7 AP%  24.9 CDE    41.32 fluid casette lot #[id number] H exp 09/23/2016   COONOSCOPY     AND ENDOSCOPY   DILATION AND CURETTAGE OF UTERUS     ENDARTERECTOMY Left 11/13/2020   Procedure: Evacuation of left neck hematoma;  Surgeon: Katha Cabal, MD;  Location: [facility];  Service: Vascular;  Laterality: Left;   ENDARTERECTOMY Left 11/13/2020   Procedure: ENDARTERECTOMY CAROTID;  Surgeon: Algernon Huxley, MD;  Location: [facility];  Service: Vascular;  Laterality: Left;   TONSILLECTOMY     TUBAL LIGATION      There were no vitals filed for this visit.   Subjective Assessment - 05/12/21 1106     Subjective Pt reports no pain currently. Pt reports no falls/stumbles. Pt reports her SIL is very ill and she went to visit her this weekend.    Patient is accompained by: --   Daughter, [name]   Pertinent History 88 y.o. female with medical history significant for type 2 diabetes mellitus, essential hypertension, acquired hypothyroidism, hyperlipidemia, stage III chronic kidney disease reports increased right sided weakness on 10/19/20. She was diagnosed with left basal ganglia ischemic stroke. She was discharged to inpatient rehab for 10 days and then discharged home. Patient was evaluated by outpatient PT on 11/12/20. CVA was due to carotid stenosis. She underwent left carotid endartectomy on 7/21. Procedure  went well however patient suffered from left cervical hematoma and had subsequent surgical procedure to stop the bleed and remove the hematoma on 11/13/20. They were concerned with her breathing and therefore intubated her for 1 day, she was in ICU for 2 days and transferred to med/surge on 11/15/20. Patient was discharged home on 11/17/20 and is now returning to outpatient PT. She has had the stiches removed and is healing well. Denies any neck pain or stiffness. She lives with her daughter and has 24/7 caregiver support. She is still using RW for most ambulation. She is able to transfer to bedside commode well and is able to stand  short time unsupported. She reports feeling very weak and fatigued. She reports she has been dragging her right foot more. She denies any numbness/tingling; She reports feeling like her legs are wanting to do more but is hesitant because of fear of falling. She is mod I for self care morning routine. She does still receive help with showering. She has been working on increasing her activity but denies any formal exercise.    Limitations Standing;Walking    How long can you sit comfortably? NA    How long can you stand comfortably? 30-60 min with support    How long can you walk comfortably? about 100 feet with RW, still limited to short distances;    Diagnostic tests MRI shows left basal ganglia infarct 10/21/20    Patient Stated Goals "Improve balance and walking and not need RW, get back to independent."    Currently in Pain? No/denies    Pain Onset In the past 7 days           INTERVENTIONS  Ambulation with hurrycane CGA 2x160 feet with cues for sequencing and coordination. Pt able to achieve periods of 2 point pattern. At end of session pt ambulated >100 ft additionally with HC, CGA.    STS 2x5, pt performs hands-free Pt rates as medium  RTB standing hip abduction 10x B. Pt struggles with technique even after VC/TC and demo.  Standing with 2.5# weights around BLE: -hip abduction 2x10 B reps -hip extension 2x10 B reps; -hamstring curls 1x10 B reps  Seated heel raises 2x20 B  Walking in // bars for dynamic balance without UE support but with close CGA 8x. Pt exhibits decreased step length and foot drag on R,   Hedgehog walking heel taps in // bars 2x through with UE support on bars. Pt requires multiple attempts for correct technique    Standing on airex: -feet together EO 2x30 sec -feet apart EC 2x30 sec; pt requires intermittent UE support on bars, CGA-min a  Firm surface: Pt attempts tandem stance; too challenging for pt Semi-tandem 2x30 sec each LE Pt uses intermittent  UE support throughout   Pt educated throughout session about proper posture and technique with exercises. Improved exercise technique, movement at target joints, use of target muscles after min to mod verbal, visual, tactile cues.     PT Education - 05/12/21 1153     Education Details exercise technique, body mechanics    Person(s) Educated Patient    Methods Explanation;Demonstration;Tactile cues;Verbal cues    Comprehension Verbalized understanding;Tactile cues required;Returned demonstration;Verbal cues required;Need further instruction              PT Short Term Goals - 04/28/21 1152       PT SHORT TERM GOAL #1   Title Patient will be adherent to HEP at least 3x a week to improve  functional strength and balance for better safety at home.    Baseline 9/21: Pt reports compliance with HEP and is confident, 1/3: doing exercise 2x a week    Time 4    Period Weeks    Status Partially Met    Target Date 05/26/21      PT SHORT TERM GOAL #2   Title Patient will be independent in transferring sit<>Stand without pushing on arm rests to improve ability to get up from chair.    Baseline 9/21: pt able to complete STS without use of UEs, 1/3:able to stand but has moderate difficulty requiring increased time;    Time 4    Period Weeks    Status Partially Met    Target Date 05/26/21               PT Long Term Goals - 04/28/21 1153       PT LONG TERM GOAL #1   Title Patient (> 73 years old) will complete five times sit to stand test in < 15 seconds indicating an increased LE strength and improved balance.    Baseline 9/21: 29 seconds hands-free 10/10: deferred 10/12: 34 sec hands free; 11/2: 23.8 sec hands-free; 12/7: 33.2 sec hands free, 1/3: 33.92 hands free    Time 8    Period Weeks    Status On-going    Target Date 06/23/21      PT LONG TERM GOAL #2   Title Patient will increase six minute walk test distance to >400 feet for improve gait ability    Baseline 9/21: 368  ft with RW; 10/10: deferred 10/12: 408 ft; 11/2: 476 ft with 4WW, 1/3: 475 feet with RW    Time 8    Period Weeks    Status Achieved    Target Date 06/23/21      PT LONG TERM GOAL #3   Title Patient will increase 10 meter walk test to >1.24ms as to improve gait speed for better community ambulation and to reduce fall risk.    Baseline 9/21: 0.5 m/s with RW; 10/10 deferred 10/12: 0.93 m/s with 48HW 11/2: 0.83 m/s with 42XH 12/7: 0.52 m/s with RW, 1/3: 0.704    Time 8    Period Weeks    Status Partially Met    Target Date 06/23/21      PT LONG TERM GOAL #4   Title Patient will be independent with ascend/descend 6 steps using single UE in step over step pattern without LOB.    Baseline 9/21:  Patient uses PT stairs (4 steps) with bilateral upper extremity support on handrails for both ascending/descending.  Patient exhibits reciprocal pattern ascending and step to pattern descending. 10/10: deferred; 10/12: ascending/descending recip. steps with BUE support; 11/2: Ascended/descended 8 steps total, reciprocal pattern ascending and step-to descending. Pt uses UUE support throughout with CGA assist.; 12/7: Ascended and descended 8 steps total with UUE support and CGA. Reciprocal stepping ascending and step to descending., 1/3: Deferrred    Time 8    Period Weeks    Status Partially Met    Target Date 06/23/21      PT LONG TERM GOAL #5   Title Patient will demonstrate an improved Berg Balance Score of >45/56 as to demonstrate improved balance with ADLs such as sitting/standing and transfer balance and reduced fall risk.    Baseline 9/21: deferred d/t time; 9/29: 42/56; 10/10: deferred; 10/12: 44/56; 11/2: deferred; 11/7: 46/56, 1/3: deferred    Time 8  Period Weeks    Status Achieved    Target Date 06/23/21      PT LONG TERM GOAL #6   Title Patient will improve FOTO score to >60% to indicate improved functional mobility with ADLs.    Baseline 9/21: 59; 10/10: 57; 11/2: 60; 12/7: 58%, 1/3:  61%    Time 8    Period Weeks    Status Achieved    Target Date 06/23/21                   Plan - 05/12/21 1154     Clinical Impression Statement Pt presented with excellent motivation to participate in session. Pt showed improved sequencing with HC, and required minimal cues to achieve 2 point pattern. Pt progressed level of resistance used with therex and level of challenge/complexity with balance interventions. Pt required CGA-min a throughout for balance interventions. The pt will benefit from further skilled PT to improve strength, balance and mobility.    Personal Factors and Comorbidities Age    Comorbidities HTN, CKD, fall risk, type 2 diabetes,    Examination-Activity Limitations Lift;Locomotion Level;Squat;Stairs;Stand;Toileting;Transfers;Bathing    Examination-Participation Restrictions Cleaning;Community Activity;Laundry;Meal Prep;Shop;Volunteer;Driving    Stability/Clinical Decision Making Stable/Uncomplicated    Rehab Potential Poor    PT Frequency 2x / week    PT Duration 8 weeks    PT Treatment/Interventions Cryotherapy;Electrical Stimulation;Moist Heat;Gait training;Stair training;Functional mobility training;Therapeutic activities;Therapeutic exercise;Neuromuscular re-education;Balance training;Patient/family education;Orthotic Fit/Training;Energy conservation    PT Next Visit Plan work on LE strengthening and balance; gait training, endurance, continue POC as previously indicated    PT Home Exercise Plan Access Code: [email or web address]; no updates    Consulted and Agree with Plan of Care Patient             Patient will benefit from skilled therapeutic intervention in order to improve the following deficits and impairments:  Abnormal gait, Decreased balance, Decreased endurance, Decreased mobility, Difficulty walking, Cardiopulmonary status limiting activity, Decreased activity tolerance, Decreased coordination, Decreased strength  Visit Diagnosis: Muscle weakness  (generalized)  Unsteadiness on feet  Other abnormalities of gait and mobility  Other lack of coordination     Problem List Patient Active Problem List   Diagnosis Date Noted   Carotid stenosis, symptomatic, with infarction ([place]) 11/13/2020   Gout flare 11/10/2020   UTI (urinary tract infection) 11/10/2020   Left carotid artery stenosis 11/10/2020   Chronic kidney disease 11/10/2020   Left basal ganglia embolic stroke ([place]) 82/95/6213   PAC (premature atrial contraction)    Pre-procedural cardiovascular examination    Ischemic stroke ([place]) 10/20/2020   TIA (transient ischemic attack) 10/19/2020   Right hemiparesis ([place]) 10/19/2020   Type 2 diabetes mellitus with stage 3 chronic kidney disease, without long-term current use of insulin ([place]) 08/24/2018   Hyperlipidemia, unspecified 07/19/2017   Hypertension 07/19/2017   PUD (peptic ulcer disease) 07/19/2017   Type 2 diabetes mellitus ([place]) 07/19/2017   Personal history of gout 08/12/2016   Anemia, unspecified 04/09/2016   Hypothyroidism, acquired 12/10/2015    Zollie Pee, PT 05/12/2021, 12:01 PM  [facility] [address] [place], [place], [address] Phone: [phone]   Fax:  [phone]  Name: Cassandra Schaefer MRN: [id number] Date of Birth: November 08, 1928

## 2021-05-12 NOTE — Therapy (Signed)
[place] [facility] [address] [place], [place], [address] Phone: [phone]   Fax:  [phone]  Occupational Therapy Treatment  Patient Details  Name: Cassandra Schaefer MRN: [id number] Date of Birth: 01-17-1929 Referring Provider (OT): Dr. Tracie Harrier   Encounter Date: 05/12/2021   OT End of Session - 05/12/21 1314     Visit Number 41    Number of Visits 43    Date for OT Re-Evaluation 07/14/21    Authorization Time Period Reporting period starting 02/16/2021    OT Start Time 1145    OT Stop Time 1230    OT Time Calculation (min) 45 min    Equipment Utilized During Treatment tranport chair    Activity Tolerance Patient tolerated treatment well    Behavior During Therapy WFL for tasks assessed/performed             Past Medical History:  Diagnosis Date   Anemia    Cough    RESOLVING, NO FEVER   Diabetes mellitus without complication ([place])    Gout    Hypertension    Hypothyroidism    PUD (peptic ulcer disease)    Stroke [facility])    Wears dentures    full upper, partial lower    Past Surgical History:  Procedure Laterality Date   AMPUTATION TOE Left 12/14/2017   Procedure: AMPUTATION TOE-4TH MPJ;  Surgeon: Samara Deist, DPM;  Location: [place];  Service: Podiatry;  Laterality: Left;  IVA LOCAL Diabetic - oral meds   APPENDECTOMY     CATARACT EXTRACTION W/PHACO Right 06/23/2015   Procedure: CATARACT EXTRACTION PHACO AND INTRAOCULAR LENS PLACEMENT (IOC);  Surgeon: Estill Cotta, MD;  Location: [facility];  Service: Ophthalmology;  Laterality: Right;  [place] 01:28 AP% 23.4 CDE 36.14 fluid pack lot # [id number]   CATARACT EXTRACTION W/PHACO Left 07/28/2015   Procedure: CATARACT EXTRACTION PHACO AND INTRAOCULAR LENS PLACEMENT (IOC);  Surgeon: Estill Cotta, MD;  Location: [facility];  Service: Ophthalmology;  Laterality: Left;  [place]    1:29.7 AP%  24.9 CDE   41.32 fluid casette lot [id number] H exp  09/23/2016   COONOSCOPY     AND ENDOSCOPY   DILATION AND CURETTAGE OF UTERUS     ENDARTERECTOMY Left 11/13/2020   Procedure: Evacuation of left neck hematoma;  Surgeon: Katha Cabal, MD;  Location: [facility];  Service: Vascular;  Laterality: Left;   ENDARTERECTOMY Left 11/13/2020   Procedure: ENDARTERECTOMY CAROTID;  Surgeon: Algernon Huxley, MD;  Location: [facility];  Service: Vascular;  Laterality: Left;   TONSILLECTOMY     TUBAL LIGATION      There were no vitals filed for this visit.   Subjective Assessment - 05/12/21 1314     Subjective  Pt. reports that her cold is feeling better.    Patient is accompanied by: Family member    Pertinent History R ankle pain, gout, T2DM, HTN, CKDIII; R/L carotid endarectomy    Currently in Pain? No/denies            OT TREATMENT     There. Ex.:   Pt. performed right gross gripping with a gross grip strengthener. Pt. worked on sustaining grip while grasping pegs and reaching at various heights. The Gripper was set to  17.9# of grip strength resistance.    Neuro muscular re-education:   Pt. worked on [employer], and [facility] skills grasping 1/2" flat washer, and reaching up into shoulder flexion, scaption, and abduction to place  them through various targets. Pt. Worked on accuracy with [facility] skills grasping 1/2" flat marbles, and placing them onto vertical pegs. Pt. Worked on further challenging Motor control, and [place] by adding a speed component to the task.    Pt. continues to make progress overall with RIght UE functioning. Pt. continues to present with limited depth perception, often undershooting when reaching for targets, however has improved accuracy with placing the objects onto to targets at normal speed. Pt. Presented with decreased motor control, and coordination when challenged with adding speed to the task. Pt. is improving with right hand function, and is now using her right hand more during ADL, and IADL tasks at home. Pt. Was able  to perform translatory movements storing the 1/2" marbles, and moving them through her hand from her palm to the tip of her 2nd digit, and thumb. Pt. continues to work on improving [facility] skills, motor control, and accuracy with reaching in order to work towards improving right hand function, and improving/maximizing independence with ADLs, and IADLs.                              OT Education - 05/12/21 1314     Education Details RUE functioning.    Person(s) Educated Patient    Methods Explanation;Verbal cues;Demonstration;Tactile cues    Comprehension Verbalized understanding;Verbal cues required;Returned demonstration              OT Short Term Goals - 03/23/21 1657       OT SHORT TERM GOAL #1   Title Pt will perform HEP for RUE strength/coordination independently.    Baseline Eval: HEP not yet established in outpatient, but pt is working with putty from [name]; 12/04/2020; [facility] HEP initiated this day with mod vc after return demo; 01/05/2021: indep with currently program, but ongoing as pt progresses; 01/28/2021: vc to revisit and focus on grip and pinch strengthening with putty; 03/23/21: pt is indep with HEP    Time 6    Period Weeks    Status Achieved    Target Date 03/23/21               OT Long Term Goals - 04/30/21 1126       OT LONG TERM GOAL #1   Title Pt will improve hand writing to 100% legibility with R hand to be able to independently write a check.    Baseline Eval: signature is 75% legible with R hand, requiring extra time; 12/04/2020: no chan to complete.ge from eval; 01/05/2021: Printing is 100% legible, signature is 90% legible, but still requires extra time and effort.; 01/26/21: Signature is 90% legible and speed/fluidity have improved. 20th visit: 90% legible, 03/23/21: 90% legible, extra time 40th visit 90 % legibility with increased time    Time 12    Period Weeks    Status On-going    Target Date 07/14/21      OT LONG TERM GOAL #2    Title Pt will improve R hand coordination to enable good manipulation of iphone using R dominant hand    Baseline Eval: Pt uses L hand d/t R hand lacking sufficient [place] to press buttons on phone; 12/04/2020: no change from eval; 01/05/2021: R hand coordination is improving but pt still uses L hand to dial phone; 01/28/21: Pt using R dominant hand to press buttons and apps on phone 50% of the time.20th visit: Right hand [facility] skilols are improving, Pt. is improving  with manipulation of the iphone; 03/23/21: Pt now consistently using R hand to press buttons on phone with good accuracy.40th visit: Pt. continues to have difficulty modifying the accurate amount of pressure needed to wipe the phone.    Time 12    Period Weeks    Status On-going    Target Date 07/14/21      OT LONG TERM GOAL #3   Title Pt will improve GMC throughout RUE to enable pt to reach for and pick up ADL supplies with R dominant hand without dropping or knocking over objects.    Baseline Eval: Pt reports RUE is clumsy and easily knocks over objects when trying to reach for ADL supplies.  Pt verbalizes that her "aim is off."; 12/04/2020: pt reports slight improvement since eval and has started using her R hand to eat, but still feels clumsy; 01/05/2021: Greatly improved; pt is using silverware to eat with R hand, can comb hair with RUE, still mildly ataxic; 01/26/21: pt reports difficulty picking up objects within a small space (ie; may knock the toothpaste holder down when reaching for toothbrush), but reaching for light/larger objects is improving 20th visit: pt. continues to be mildly ataxic, however is consistently improving with motor control, and FMC while reaching targets; 03/23/21: Pt reaches with accuracy towards targets if she takes her time (pt drops and knocks things over when moving at a faster/normal pace) 04/23/2021: Pt. continues to present with difficulty with depth perception, and impaired FMC.40th visit: Pt. is improving, however  tends to knock over lighter weight objects.    Time 12    Period Weeks    Status On-going    Target Date 07/14/21      OT LONG TERM GOAL #4   Title Pt will improve FOTO score to 68 or better to indicate measurable functional improvement.    Baseline Eval: FOTO 55; 01/05/2021: FOTO 61; 01/28/21: FOTO 61, 20th visit: 61; 03/23/21: FOTO 60 4oth visit: FOTO score: 61    Time 12    Period Weeks    Status On-going    Target Date 07/14/21      OT LONG TERM GOAL #5   Title Pt will perform dynamic standing tasks with modified indep, AD as needed in order to reduce fall risk with ADLs                   Plan - 05/12/21 1315     Clinical Impression Statement Pt. continues to make progress overall with RIght UE functioning. Pt. continues to present with limited depth perception, often undershooting when reaching for targets, however has improved accuracy with placing the objects onto to targets at normal speed. Pt. Presented with decreased motor control, and coordination when challenged with adding speed to the task. Pt. is improving with right hand function, and is now using her right hand more during ADL, and IADL tasks at home. Pt. Was able to perform translatory movements storing the 1/2" marbles, and moving them through her hand from her palm to the tip of her 2nd digit, and thumb. Pt. continues to work on improving [facility] skills, motor control, and accuracy with reaching in order to work towards improving right hand function, and improving/maximizing independence with ADLs, and IADLs.           OT Occupational Profile and History Detailed Assessment- Review of Records and additional review of physical, cognitive, psychosocial history related to current functional performance    Occupational performance deficits (Please refer to evaluation  for details): ADL's;Leisure;IADL's    Body Structure / Function / Physical Skills ADL;Coordination;Endurance;GMC;UE functional use;Balance;Sensation;Body  mechanics;IADL;Pain;Dexterity;FMC;Strength;Gait;Mobility    Rehab Potential Good    Clinical Decision Making Several treatment options, min-mod task modification necessary    Comorbidities Affecting Occupational Performance: May have comorbidities impacting occupational performance    Modification or Assistance to Complete Evaluation  Min-Moderate modification of tasks or assist with assess necessary to complete eval    OT Frequency 2x / week    OT Duration 12 weeks    OT Treatment/Interventions Self-care/ADL training;Therapeutic exercise;DME and/or AE instruction;Functional Mobility Training;Balance training;Neuromuscular education;Therapeutic activities;Patient/family education    Consulted and Agree with Plan of Care Patient             Patient will benefit from skilled therapeutic intervention in order to improve the following deficits and impairments:   Body Structure / Function / Physical Skills: ADL, Coordination, Endurance, GMC, UE functional use, Balance, Sensation, Body mechanics, IADL, Pain, Dexterity, FMC, Strength, Gait, Mobility       Visit Diagnosis: Muscle weakness (generalized)  Other lack of coordination    Problem List Patient Active Problem List   Diagnosis Date Noted   Carotid stenosis, symptomatic, with infarction ([place]) 11/13/2020   Gout flare 11/10/2020   UTI (urinary tract infection) 11/10/2020   Left carotid artery stenosis 11/10/2020   Chronic kidney disease 11/10/2020   Left basal ganglia embolic stroke ([place]) [id number]   PAC (premature atrial contraction)    Pre-procedural cardiovascular examination    Ischemic stroke ([place]) 10/20/2020   TIA (transient ischemic attack) 10/19/2020   Right hemiparesis ([place]) 10/19/2020   Type 2 diabetes mellitus with stage 3 chronic kidney disease, without long-term current use of insulin ([place]) 08/24/2018   Hyperlipidemia, unspecified 07/19/2017   Hypertension 07/19/2017   PUD (peptic ulcer disease) 07/19/2017    Type 2 diabetes mellitus ([place]) 07/19/2017   Personal history of gout 08/12/2016   Anemia, unspecified 04/09/2016   Hypothyroidism, acquired 12/10/2015    Harrel Carina, MS, OTR/L 05/12/2021, 1:18 PM  [place] [address] [place], [place], [address] Phone: [phone]   Fax:  [phone]  Name: Cassandra Schaefer MRN: [id number] Date of Birth: 1928-11-03

## 2021-05-14 ENCOUNTER — Encounter: Payer: Self-pay | Admitting: Physical Therapy

## 2021-05-14 ENCOUNTER — Encounter: Payer: Medicare HMO | Admitting: Speech Pathology

## 2021-05-14 ENCOUNTER — Other Ambulatory Visit: Payer: Self-pay

## 2021-05-14 ENCOUNTER — Ambulatory Visit: Payer: Medicare HMO

## 2021-05-14 ENCOUNTER — Ambulatory Visit: Payer: Medicare HMO | Admitting: Physical Therapy

## 2021-05-14 DIAGNOSIS — R278 Other lack of coordination: Secondary | ICD-10-CM

## 2021-05-14 DIAGNOSIS — R2689 Other abnormalities of gait and mobility: Secondary | ICD-10-CM

## 2021-05-14 DIAGNOSIS — M6281 Muscle weakness (generalized): Secondary | ICD-10-CM

## 2021-05-14 DIAGNOSIS — R262 Difficulty in walking, not elsewhere classified: Secondary | ICD-10-CM

## 2021-05-14 DIAGNOSIS — R482 Apraxia: Secondary | ICD-10-CM

## 2021-05-14 DIAGNOSIS — R2681 Unsteadiness on feet: Secondary | ICD-10-CM

## 2021-05-14 NOTE — Therapy (Signed)
[place] MAIN [facility] SERVICES [address] [place], [place], [address] Phone: [phone]   Fax:  [phone]  Physical Therapy Treatment  Patient Details  Name: Cassandra Schaefer MRN: [id number] Date of Birth: [birth year] Referring Provider (PT): Dr. Dew/Dr. Ginette Pitman PCP   Encounter Date: 05/14/2021   PT End of Session - 05/14/21 1150     Visit Number 39    Number of Visits 61    Date for PT Re-Evaluation 06/23/21    Authorization Type [employer]    Authorization Time Period 02/02/21-04/27/21    Progress Note Due on Visit 35    PT Start Time 1148    PT Stop Time 1230    PT Time Calculation (min) 42 min    Equipment Utilized During Treatment Gait belt    Activity Tolerance Patient tolerated treatment well    Behavior During Therapy WFL for tasks assessed/performed             Past Medical History:  Diagnosis Date   Anemia    Cough    RESOLVING, NO FEVER   Diabetes mellitus without complication ([place])    Gout    Hypertension    Hypothyroidism    PUD (peptic ulcer disease)    Stroke [facility])    Wears dentures    full upper, partial lower    Past Surgical History:  Procedure Laterality Date   AMPUTATION TOE Left 12/14/2017   Procedure: AMPUTATION TOE-4TH MPJ;  Surgeon: Samara Deist, DPM;  Location: [facility];  Service: Podiatry;  Laterality: Left;  IVA LOCAL Diabetic - oral meds   APPENDECTOMY     CATARACT EXTRACTION W/PHACO Right 06/23/2015   Procedure: CATARACT EXTRACTION PHACO AND INTRAOCULAR LENS PLACEMENT (IOC);  Surgeon: Estill Cotta, MD;  Location: [facility];  Service: Ophthalmology;  Laterality: Right;  [place] 01:28 AP% 23.4 CDE 36.14 fluid pack lot # [id number]   CATARACT EXTRACTION W/PHACO Left 07/28/2015   Procedure: CATARACT EXTRACTION PHACO AND INTRAOCULAR LENS PLACEMENT (IOC);  Surgeon: Estill Cotta, MD;  Location: [facility];  Service: Ophthalmology;  Laterality: Left;  [place]    1:29.7 AP%  24.9 CDE    41.32 fluid casette lot #[id number] H exp 09/23/2016   COONOSCOPY     AND ENDOSCOPY   DILATION AND CURETTAGE OF UTERUS     ENDARTERECTOMY Left 11/13/2020   Procedure: Evacuation of left neck hematoma;  Surgeon: Katha Cabal, MD;  Location: [facility];  Service: Vascular;  Laterality: Left;   ENDARTERECTOMY Left 11/13/2020   Procedure: ENDARTERECTOMY CAROTID;  Surgeon: Algernon Huxley, MD;  Location: [facility];  Service: Vascular;  Laterality: Left;   TONSILLECTOMY     TUBAL LIGATION      There were no vitals filed for this visit.   Subjective Assessment - 05/14/21 1149     Subjective Patient reports doing well; No pain currently; Reports HEP is going well and she reports feeling like she can see some improvement in LE movement;    Patient is accompained by: --   Daughter, [name]   Pertinent History 88 y.o. female with medical history significant for type 2 diabetes mellitus, essential hypertension, acquired hypothyroidism, hyperlipidemia, stage III chronic kidney disease reports increased right sided weakness on 10/19/20. She was diagnosed with left basal ganglia ischemic stroke. She was discharged to inpatient rehab for 10 days and then discharged home. Patient was evaluated by outpatient PT on 11/12/20. CVA was due to carotid stenosis. She underwent left carotid endartectomy on 7/21.  Procedure went well however patient suffered from left cervical hematoma and had subsequent surgical procedure to stop the bleed and remove the hematoma on 11/13/20. They were concerned with her breathing and therefore intubated her for 1 day, she was in ICU for 2 days and transferred to med/surge on 11/15/20. Patient was discharged home on 11/17/20 and is now returning to outpatient PT. She has had the stiches removed and is healing well. Denies any neck pain or stiffness. She lives with her daughter and has 24/7 caregiver support. She is still using RW for most ambulation. She is able to transfer to bedside commode well and is  able to stand short time unsupported. She reports feeling very weak and fatigued. She reports she has been dragging her right foot more. She denies any numbness/tingling; She reports feeling like her legs are wanting to do more but is hesitant because of fear of falling. She is mod I for self care morning routine. She does still receive help with showering. She has been working on increasing her activity but denies any formal exercise.    Limitations Standing;Walking    How long can you sit comfortably? NA    How long can you stand comfortably? 30-60 min with support    How long can you walk comfortably? about 100 feet with RW, still limited to short distances;    Diagnostic tests MRI shows left basal ganglia infarct 10/21/20    Patient Stated Goals "Improve balance and walking and not need RW, get back to independent."    Currently in Pain? No/denies    Pain Onset In the past 7 days                  TREATMENT:       INTERVENTIONS - gait belt donned and CGA used with all upright interventions unless otherwise specified     Ambulation with hurrycane and Min A to CGA x80 feet with cues for sequencing and coordination. Patient able to exhibit better coordination being able to achieve 2 point gait pattern with less cues this session;    Standing with red tband around BLE: -hip flexion x10 reps with cues to keep leg straight for better hip strengthening;  -hip abduction x10 reps -hip extension x10 reps; -side stepping x10 feet x2 laps Patient required min-moderate verbal/tactile cues for correct exercise technique. Patient able to exhibit good positioning with minimal compensation. Does report mild fatigue at end of session;   Seated with red tband around BLE: -ankle DF x15 reps each LE, minimal difficulty reported;   NMR: Instructed patient in balance exercise: Standing on 1/2 bolster: -BUE rail assist:  Heel/toe rock x15 reps -Feet apart, BUE wand flexion x10 reps with CGA  for safety with cues for neutral ankle position -Tandem stance with 2-0 rail assist 10 sec hold x2 reps each foot in front;  Standing on airex: -Feet apart, heel/toe raises unsupported x15 reps with min A for safety; -Feet together, arms across chest trunk rotation x5 reps each direction with min A with increased instability when turning to left side;  -alternate toe taps to 6 inch step with 0 rail assist x15 reps; Patient required min A for safety with increased toe drag/difficulty clearing RLE foot -standing one foot on airex, one foot on 6 inch step:             Unsupported x15 sec, no instability             Unsupported with cone pass  side/side x10 reps with CGA to min A and mild instability noted  Gait weaving around cones #4 with tripod base cane x2 sets with min A for safety with slower gait speed and uneven cadence;  Patient instructed in picking up cones #4 with CGA to min A for safety;    Pt educated throughout session about proper posture and technique with exercises. Improved exercise technique, movement at target joints, use of target muscles after min to mod verbal, visual, tactile cues.   Patient tolerated session well. She does report increased fatigue at end of session. Patient pleased with new HEP                              PT Education - 05/14/21 1149     Education Details exercise technique/positioning;    Person(s) Educated Patient    Methods Explanation;Verbal cues    Comprehension Verbalized understanding;Returned demonstration;Verbal cues required;Need further instruction              PT Short Term Goals - 04/28/21 1152       PT SHORT TERM GOAL #1   Title Patient will be adherent to HEP at least 3x a week to improve functional strength and balance for better safety at home.    Baseline 9/21: Pt reports compliance with HEP and is confident, 1/3: doing exercise 2x a week    Time 4    Period Weeks    Status Partially Met     Target Date 05/26/21      PT SHORT TERM GOAL #2   Title Patient will be independent in transferring sit<>Stand without pushing on arm rests to improve ability to get up from chair.    Baseline 9/21: pt able to complete STS without use of UEs, 1/3:able to stand but has moderate difficulty requiring increased time;    Time 4    Period Weeks    Status Partially Met    Target Date 05/26/21               PT Long Term Goals - 04/28/21 1153       PT LONG TERM GOAL #1   Title Patient (> 55 years old) will complete five times sit to stand test in < 15 seconds indicating an increased LE strength and improved balance.    Baseline 9/21: 29 seconds hands-free 10/10: deferred 10/12: 34 sec hands free; 11/2: 23.8 sec hands-free; 12/7: 33.2 sec hands free, 1/3: 33.92 hands free    Time 8    Period Weeks    Status On-going    Target Date 06/23/21      PT LONG TERM GOAL #2   Title Patient will increase six minute walk test distance to >400 feet for improve gait ability    Baseline 9/21: 368 ft with RW; 10/10: deferred 10/12: 408 ft; 11/2: 476 ft with 4WW, 1/3: 475 feet with RW    Time 8    Period Weeks    Status Achieved    Target Date 06/23/21      PT LONG TERM GOAL #3   Title Patient will increase 10 meter walk test to >1.13ms as to improve gait speed for better community ambulation and to reduce fall risk.    Baseline 9/21: 0.5 m/s with RW; 10/10 deferred 10/12: 0.93 m/s with 48LF 11/2: 0.83 m/s with 48BO 12/7: 0.52 m/s with RW, 1/3: 0.704    Time 8  Period Weeks    Status Partially Met    Target Date 06/23/21      PT LONG TERM GOAL #4   Title Patient will be independent with ascend/descend 6 steps using single UE in step over step pattern without LOB.    Baseline 9/21:  Patient uses PT stairs (4 steps) with bilateral upper extremity support on handrails for both ascending/descending.  Patient exhibits reciprocal pattern ascending and step to pattern descending. 10/10: deferred;  10/12: ascending/descending recip. steps with BUE support; 11/2: Ascended/descended 8 steps total, reciprocal pattern ascending and step-to descending. Pt uses UUE support throughout with CGA assist.; 12/7: Ascended and descended 8 steps total with UUE support and CGA. Reciprocal stepping ascending and step to descending., 1/3: Deferrred    Time 8    Period Weeks    Status Partially Met    Target Date 06/23/21      PT LONG TERM GOAL #5   Title Patient will demonstrate an improved Berg Balance Score of >45/56 as to demonstrate improved balance with ADLs such as sitting/standing and transfer balance and reduced fall risk.    Baseline 9/21: deferred d/t time; 9/29: 42/56; 10/10: deferred; 10/12: 44/56; 11/2: deferred; 11/7: 46/56, 1/3: deferred    Time 8    Period Weeks    Status Achieved    Target Date 06/23/21      PT LONG TERM GOAL #6   Title Patient will improve FOTO score to >60% to indicate improved functional mobility with ADLs.    Baseline 9/21: 59; 10/10: 57; 11/2: 60; 12/7: 58%, 1/3: 61%    Time 8    Period Weeks    Status Achieved    Target Date 06/23/21                   Plan - 05/14/21 1525     Clinical Impression Statement Patient motivated and participated well within session. She was instructed in advanced LE strengthening exercise. She does require min VCs for proper positioning/exercise technique. progressed balance exercise utilizing compliant surface to challenge ankle stability and positioning. Patient does require intermittent rail assist. She was able to stand without rail assist, but does require min A for safety. Patient would benefit from additional skilled PT Intervention to improve strength, balance and gait safety. She did have increased difficulty walking around cones when using tripod cane compared to walking forward;    Personal Factors and Comorbidities Age    Comorbidities HTN, CKD, fall risk, type 2 diabetes,    Examination-Activity Limitations  Lift;Locomotion Level;Squat;Stairs;Stand;Toileting;Transfers;Bathing    Examination-Participation Restrictions Cleaning;Community Activity;Laundry;Meal Prep;Shop;Volunteer;Driving    Stability/Clinical Decision Making Stable/Uncomplicated    Rehab Potential Poor    PT Frequency 2x / week    PT Duration 8 weeks    PT Treatment/Interventions Cryotherapy;Electrical Stimulation;Moist Heat;Gait training;Stair training;Functional mobility training;Therapeutic activities;Therapeutic exercise;Neuromuscular re-education;Balance training;Patient/family education;Orthotic Fit/Training;Energy conservation    PT Next Visit Plan work on LE strengthening and balance; gait training, endurance, continue POC as previously indicated    PT Home Exercise Plan Access Code: [email or web address]; no updates    Consulted and Agree with Plan of Care Patient             Patient will benefit from skilled therapeutic intervention in order to improve the following deficits and impairments:  Abnormal gait, Decreased balance, Decreased endurance, Decreased mobility, Difficulty walking, Cardiopulmonary status limiting activity, Decreased activity tolerance, Decreased coordination, Decreased strength  Visit Diagnosis: Muscle weakness (generalized)  Other lack of coordination  Unsteadiness on  feet  Other abnormalities of gait and mobility  Difficulty in walking, not elsewhere classified     Problem List Patient Active Problem List   Diagnosis Date Noted   Carotid stenosis, symptomatic, with infarction ([place]) 11/13/2020   Gout flare 11/10/2020   UTI (urinary tract infection) 11/10/2020   Left carotid artery stenosis 11/10/2020   Chronic kidney disease 11/10/2020   Left basal ganglia embolic stroke ([place]) 82/57/4935   PAC (premature atrial contraction)    Pre-procedural cardiovascular examination    Ischemic stroke ([place]) 10/20/2020   TIA (transient ischemic attack) 10/19/2020   Right hemiparesis ([place]) 10/19/2020   Type  2 diabetes mellitus with stage 3 chronic kidney disease, without long-term current use of insulin ([place]) 08/24/2018   Hyperlipidemia, unspecified 07/19/2017   Hypertension 07/19/2017   PUD (peptic ulcer disease) 07/19/2017   Type 2 diabetes mellitus ([place]) 07/19/2017   Personal history of gout 08/12/2016   Anemia, unspecified 04/09/2016   Hypothyroidism, acquired 12/10/2015    Jazsmin Couse, PT, DPT 05/14/2021, 3:34 PM  [place] MAIN [facility] SERVICES [place], [place], [address] Phone: [phone]   Fax:  [phone]  Name: Cassandra Schaefer MRN: [id number] Date of Birth: 1928/11/22

## 2021-05-14 NOTE — Therapy (Signed)
[place] MAIN [facility] SERVICES [address] [place], [place], [address] Phone: [phone]   Fax:  [phone]  Occupational Therapy Treatment  Patient Details  Name: Cassandra Schaefer MRN: [id number] Date of Birth: 1928/10/03 Referring Provider (OT): Dr. Tracie Harrier   Encounter Date: 05/14/2021   OT End of Session - 05/14/21 1539     Visit Number 42    Number of Visits 56    Date for OT Re-Evaluation 07/14/21    Authorization Time Period Reporting period starting 02/16/2021    OT Start Time 1100    OT Stop Time 1145    OT Time Calculation (min) 45 min    Equipment Utilized During Treatment tranport chair    Activity Tolerance Patient tolerated treatment well    Behavior During Therapy WFL for tasks assessed/performed             Past Medical History:  Diagnosis Date   Anemia    Cough    RESOLVING, NO FEVER   Diabetes mellitus without complication ([place])    Gout    Hypertension    Hypothyroidism    PUD (peptic ulcer disease)    Stroke [facility])    Wears dentures    full upper, partial lower    Past Surgical History:  Procedure Laterality Date   AMPUTATION TOE Left 12/14/2017   Procedure: AMPUTATION TOE-4TH MPJ;  Surgeon: Samara Deist, DPM;  Location: [place];  Service: Podiatry;  Laterality: Left;  IVA LOCAL Diabetic - oral meds   APPENDECTOMY     CATARACT EXTRACTION W/PHACO Right 06/23/2015   Procedure: CATARACT EXTRACTION PHACO AND INTRAOCULAR LENS PLACEMENT (IOC);  Surgeon: Estill Cotta, MD;  Location: [facility];  Service: Ophthalmology;  Laterality: Right;  [place] 01:28 AP% 23.4 CDE 36.14 fluid pack lot # [id number]   CATARACT EXTRACTION W/PHACO Left 07/28/2015   Procedure: CATARACT EXTRACTION PHACO AND INTRAOCULAR LENS PLACEMENT (IOC);  Surgeon: Estill Cotta, MD;  Location: [facility];  Service: Ophthalmology;  Laterality: Left;  [place]    1:29.7 AP%  24.9 CDE   41.32 fluid casette lot [id number] H exp  09/23/2016   COONOSCOPY     AND ENDOSCOPY   DILATION AND CURETTAGE OF UTERUS     ENDARTERECTOMY Left 11/13/2020   Procedure: Evacuation of left neck hematoma;  Surgeon: Katha Cabal, MD;  Location: [facility];  Service: Vascular;  Laterality: Left;   ENDARTERECTOMY Left 11/13/2020   Procedure: ENDARTERECTOMY CAROTID;  Surgeon: Algernon Huxley, MD;  Location: [facility];  Service: Vascular;  Laterality: Left;   TONSILLECTOMY     TUBAL LIGATION      There were no vitals filed for this visit.   Subjective Assessment - 05/14/21 1537     Subjective  Pt states she no longer has to use a lid on her cup when she takes sips with her R hand.    Patient is accompanied by: Family member    Pertinent History R ankle pain, gout, T2DM, HTN, CKDIII; R/L carotid endarectomy    Patient Stated Goals "I want to improve my aim when I'm using my R arm."    Currently in Pain? No/denies    Pain Onset In the past 7 days            Occupational Therapy Treatment: Therapeutic Exercise: Facilitated hand strengthening with use of hand gripper set at 11.2# to remove jumbo pegs from pegboard x2 trials R hand.  Facilitated pinch strengthening with use of therapy resistant  clothespins to target lateral and 3 point pinch of R hand using all colors to clip pins onto horiz and vertical targets.    Neuro re-ed: Pt practiced picking up thin 1" pegs and practiced sliding pegs through small pegboard holes to address reaching toward a target.  Changed placement of pegboard to target forward, contra and ipsilateral reaching with RUE.  Instructed pt in digit isolation exercises performing single finger taps, alternating finger taps between 2 digits, and quick abd/add of digits on table top.  Tactile cues for accurate completion and extra time for alternating finger taps.   Response to Treatment: See Plan/clinical impression below.    OT Education - 05/14/21 1539     Education Details RUE functioning.    Person(s)  Educated Patient    Methods Explanation;Verbal cues;Demonstration;Tactile cues    Comprehension Verbalized understanding;Verbal cues required;Returned demonstration              OT Short Term Goals - 03/23/21 1657       OT SHORT TERM GOAL #1   Title Pt will perform HEP for RUE strength/coordination independently.    Baseline Eval: HEP not yet established in outpatient, but pt is working with putty from [name]; 12/04/2020; [facility] HEP initiated this day with mod vc after return demo; 01/05/2021: indep with currently program, but ongoing as pt progresses; 01/28/2021: vc to revisit and focus on grip and pinch strengthening with putty; 03/23/21: pt is indep with HEP    Time 6    Period Weeks    Status Achieved    Target Date 03/23/21               OT Long Term Goals - 04/30/21 1126       OT LONG TERM GOAL #1   Title Pt will improve hand writing to 100% legibility with R hand to be able to independently write a check.    Baseline Eval: signature is 75% legible with R hand, requiring extra time; 12/04/2020: no chan to complete.ge from eval; 01/05/2021: Printing is 100% legible, signature is 90% legible, but still requires extra time and effort.; 01/26/21: Signature is 90% legible and speed/fluidity have improved. 20th visit: 90% legible, 03/23/21: 90% legible, extra time 40th visit 90 % legibility with increased time    Time 12    Period Weeks    Status On-going    Target Date 07/14/21      OT LONG TERM GOAL #2   Title Pt will improve R hand coordination to enable good manipulation of iphone using R dominant hand    Baseline Eval: Pt uses L hand d/t R hand lacking sufficient [place] to press buttons on phone; 12/04/2020: no change from eval; 01/05/2021: R hand coordination is improving but pt still uses L hand to dial phone; 01/28/21: Pt using R dominant hand to press buttons and apps on phone 50% of the time.20th visit: Right hand [facility] skilols are improving, Pt. is improving with manipulation of the  iphone; 03/23/21: Pt now consistently using R hand to press buttons on phone with good accuracy.40th visit: Pt. continues to have difficulty modifying the accurate amount of pressure needed to wipe the phone.    Time 12    Period Weeks    Status On-going    Target Date 07/14/21      OT LONG TERM GOAL #3   Title Pt will improve GMC throughout RUE to enable pt to reach for and pick up ADL supplies with R dominant hand without  dropping or knocking over objects.    Baseline Eval: Pt reports RUE is clumsy and easily knocks over objects when trying to reach for ADL supplies.  Pt verbalizes that her "aim is off."; 12/04/2020: pt reports slight improvement since eval and has started using her R hand to eat, but still feels clumsy; 01/05/2021: Greatly improved; pt is using silverware to eat with R hand, can comb hair with RUE, still mildly ataxic; 01/26/21: pt reports difficulty picking up objects within a small space (ie; may knock the toothpaste holder down when reaching for toothbrush), but reaching for light/larger objects is improving 20th visit: pt. continues to be mildly ataxic, however is consistently improving with motor control, and [place] while reaching targets; 03/23/21: Pt reaches with accuracy towards targets if she takes her time (pt drops and knocks things over when moving at a faster/normal pace) 04/23/2021: Pt. continues to present with difficulty with depth perception, and impaired [place].40th visit: Pt. is improving, however tends to knock over lighter weight objects.    Time 12    Period Weeks    Status On-going    Target Date 07/14/21      OT LONG TERM GOAL #4   Title Pt will improve FOTO score to 68 or better to indicate measurable functional improvement.    Baseline Eval: FOTO 55; 01/05/2021: FOTO 61; 01/28/21: FOTO 61, 20th visit: 61; 03/23/21: FOTO 60 4oth visit: FOTO score: 61    Time 12    Period Weeks    Status On-going    Target Date 07/14/21      OT LONG TERM GOAL #5   Title Pt will  perform dynamic standing tasks with modified indep, AD as needed in order to reduce fall risk with ADLs              Plan - 05/14/21 1552     Clinical Impression Statement Pt states she no longer has to use a lid on her cup when she takes sips with her R hand.  Pt continues to develop FMC/GMC and strength throughout RUE.  Pt will continue to benefit from skilled OT to address above noted skills for maximizing accuracy and efficiency with functional use of R dominant arm during daily tasks.    OT Occupational Profile and History Detailed Assessment- Review of Records and additional review of physical, cognitive, psychosocial history related to current functional performance    Occupational performance deficits (Please refer to evaluation for details): ADL's;Leisure;IADL's    Body Structure / Function / Physical Skills ADL;Coordination;Endurance;GMC;UE functional use;Balance;Sensation;Body mechanics;IADL;Pain;Dexterity;FMC;Strength;Gait;Mobility    Rehab Potential Good    Clinical Decision Making Several treatment options, min-mod task modification necessary    Comorbidities Affecting Occupational Performance: May have comorbidities impacting occupational performance    Modification or Assistance to Complete Evaluation  Min-Moderate modification of tasks or assist with assess necessary to complete eval    OT Frequency 2x / week    OT Duration 12 weeks    OT Treatment/Interventions Self-care/ADL training;Therapeutic exercise;DME and/or AE instruction;Functional Mobility Training;Balance training;Neuromuscular education;Therapeutic activities;Patient/family education    Consulted and Agree with Plan of Care Patient             Patient will benefit from skilled therapeutic intervention in order to improve the following deficits and impairments:   Body Structure / Function / Physical Skills: ADL, Coordination, Endurance, GMC, UE functional use, Balance, Sensation, Body mechanics, IADL, Pain,  Dexterity, FMC, Strength, Gait, Mobility       Visit Diagnosis: Apraxia  Muscle weakness (generalized)  Other lack of coordination    Problem List Patient Active Problem List   Diagnosis Date Noted   Carotid stenosis, symptomatic, with infarction ([place]) 11/13/2020   Gout flare 11/10/2020   UTI (urinary tract infection) 11/10/2020   Left carotid artery stenosis 11/10/2020   Chronic kidney disease 11/10/2020   Left basal ganglia embolic stroke ([place]) [id number]   PAC (premature atrial contraction)    Pre-procedural cardiovascular examination    Ischemic stroke ([place]) 10/20/2020   TIA (transient ischemic attack) 10/19/2020   Right hemiparesis ([place]) 10/19/2020   Type 2 diabetes mellitus with stage 3 chronic kidney disease, without long-term current use of insulin ([place]) 08/24/2018   Hyperlipidemia, unspecified 07/19/2017   Hypertension 07/19/2017   PUD (peptic ulcer disease) 07/19/2017   Type 2 diabetes mellitus ([place]) 07/19/2017   Personal history of gout 08/12/2016   Anemia, unspecified 04/09/2016   Hypothyroidism, acquired 12/10/2015   Leta Speller, MS, OTR/L  Darleene Cleaver, OT 05/14/2021, 3:53 PM  [place] MAIN [facility] SERVICES [address] [place], [place], [address] Phone: [phone]   Fax:  [phone]  Name: Cassandra Schaefer MRN: [id number] Date of Birth: 1928-12-04

## 2021-05-19 ENCOUNTER — Ambulatory Visit: Payer: Medicare HMO | Admitting: Physical Therapy

## 2021-05-19 ENCOUNTER — Ambulatory Visit: Payer: Medicare HMO | Admitting: Occupational Therapy

## 2021-05-19 ENCOUNTER — Other Ambulatory Visit: Payer: Self-pay

## 2021-05-19 ENCOUNTER — Encounter: Payer: Medicare HMO | Admitting: Speech Pathology

## 2021-05-19 ENCOUNTER — Encounter: Payer: Self-pay | Admitting: Physical Therapy

## 2021-05-19 DIAGNOSIS — R2681 Unsteadiness on feet: Secondary | ICD-10-CM

## 2021-05-19 DIAGNOSIS — R262 Difficulty in walking, not elsewhere classified: Secondary | ICD-10-CM

## 2021-05-19 DIAGNOSIS — M6281 Muscle weakness (generalized): Secondary | ICD-10-CM

## 2021-05-19 DIAGNOSIS — R2689 Other abnormalities of gait and mobility: Secondary | ICD-10-CM

## 2021-05-19 DIAGNOSIS — R269 Unspecified abnormalities of gait and mobility: Secondary | ICD-10-CM

## 2021-05-19 DIAGNOSIS — R278 Other lack of coordination: Secondary | ICD-10-CM

## 2021-05-19 NOTE — Patient Instructions (Signed)
Access Code: [email or web address] URL: [email or web address] Date: 05/19/2021 Prepared by: Zettie Pho  Exercises Forward Step Up - 1 x daily - 5 x weekly - 1 sets - 15 reps Standing Hip Abduction with Resistance at Ankles and Counter Support - 1 x daily - 5 x weekly - 1 sets - 15 reps Standing Hip Extension with Resistance at Ankles and Unilateral Counter Support - 1 x daily - 5 x weekly - 1 sets - 15 reps Side Stepping with Resistance at Ankles and Counter Support - 1 x daily - 5 x weekly - 1 sets - 2 reps Heel Raises with Unilateral Counter Support - 1 x daily - 5 x weekly - 1 sets - 15 reps Backward Walking with Counter Support - 1 x daily - 5 x weekly - 1 sets - 2-3 reps Standing Balance with Head Turn in Corner - 1 x daily - 5 x weekly - 1 sets - 10 reps

## 2021-05-19 NOTE — Therapy (Signed)
[facility] [facility] MAIN [facility] SERVICES [address] [place], [place], [address] Phone: [phone]   Fax:  [phone]  Patient Details  Name: Shivon Hackel MRN: [id number] Date of Birth: 1928-08-08 Referring Provider:  Barbette Reichmann, MD  Encounter Date: 05/19/2021  Pt. arrived for OT treatment session today, however it was scheduled during the [facility] support group. Pt. reported that she was hoping to attend the support group meeting today. Pt. was transported to the support group meeting, as was offered a later OT treatment time. Pt. was unable to stay for the later open appointment due to her transportation having to pick up children from school this afternoon.    Olegario Messier, MS, OTR/L 05/19/2021, 4:53 PM  [place] [facility] MAIN [facility] SERVICES [address] [place], [place], [address] Phone: [phone]   Fax:  [phone]

## 2021-05-19 NOTE — Therapy (Signed)
[place] [facility] SERVICES [address] [place], [place], [address] Phone: [phone]   Fax:  [phone]  Physical Therapy Treatment Physical Therapy Progress Note   Dates of reporting period  04/01/21   to   05/19/21   Patient Details  Name: Cassandra Schaefer MRN: [id number] Date of Birth: 10-12-1928 Referring Provider (PT): Dr. Dew/Dr. Ginette Pitman PCP   Encounter Date: 05/19/2021   PT End of Session - 05/20/21 0903     Visit Number 40    Number of Visits 19    Date for PT Re-Evaluation 06/23/21    Authorization Type [employer]    Authorization Time Period 02/02/21-04/27/21    Progress Note Due on Visit 30    PT Start Time 1102    PT Stop Time 1145    PT Time Calculation (min) 43 min    Equipment Utilized During Treatment Gait belt    Activity Tolerance Patient tolerated treatment well    Behavior During Therapy WFL for tasks assessed/performed             Past Medical History:  Diagnosis Date   Anemia    Cough    RESOLVING, NO FEVER   Diabetes mellitus without complication ([place])    Gout    Hypertension    Hypothyroidism    PUD (peptic ulcer disease)    Stroke [facility])    Wears dentures    full upper, partial lower    Past Surgical History:  Procedure Laterality Date   AMPUTATION TOE Left 12/14/2017   Procedure: AMPUTATION TOE-4TH MPJ;  Surgeon: Samara Deist, DPM;  Location: [place];  Service: Podiatry;  Laterality: Left;  IVA LOCAL Diabetic - oral meds   APPENDECTOMY     CATARACT EXTRACTION W/PHACO Right 06/23/2015   Procedure: CATARACT EXTRACTION PHACO AND INTRAOCULAR LENS PLACEMENT (IOC);  Surgeon: Estill Cotta, MD;  Location: [facility];  Service: Ophthalmology;  Laterality: Right;  [place] 01:28 AP% 23.4 CDE 36.14 fluid pack lot # [id number]   CATARACT EXTRACTION W/PHACO Left 07/28/2015   Procedure: CATARACT EXTRACTION PHACO AND INTRAOCULAR LENS PLACEMENT (IOC);  Surgeon: Estill Cotta, MD;  Location:  [facility];  Service: Ophthalmology;  Laterality: Left;  [place]    1:29.7 AP%  24.9 CDE   41.32 fluid casette lot #[id number] H exp 09/23/2016   COONOSCOPY     AND ENDOSCOPY   DILATION AND CURETTAGE OF UTERUS     ENDARTERECTOMY Left 11/13/2020   Procedure: Evacuation of left neck hematoma;  Surgeon: Katha Cabal, MD;  Location: [facility];  Service: Vascular;  Laterality: Left;   ENDARTERECTOMY Left 11/13/2020   Procedure: ENDARTERECTOMY CAROTID;  Surgeon: Algernon Huxley, MD;  Location: [facility];  Service: Vascular;  Laterality: Left;   TONSILLECTOMY     TUBAL LIGATION      There were no vitals filed for this visit.   Subjective Assessment - 05/19/21 1107     Subjective Patient reports doing well. She reports HEP is going well, still using red tband. No new falls.    Patient is accompained by: --   Daughter, [name]   Pertinent History 88 y.o. female with medical history significant for type 2 diabetes mellitus, essential hypertension, acquired hypothyroidism, hyperlipidemia, stage III chronic kidney disease reports increased right sided weakness on 10/19/20. She was diagnosed with left basal ganglia ischemic stroke. She was discharged to inpatient rehab for 10 days and then discharged home. Patient was evaluated by outpatient PT on 11/12/20. CVA was  due to carotid stenosis. She underwent left carotid endartectomy on 7/21. Procedure went well however patient suffered from left cervical hematoma and had subsequent surgical procedure to stop the bleed and remove the hematoma on 11/13/20. They were concerned with her breathing and therefore intubated her for 1 day, she was in ICU for 2 days and transferred to med/surge on 11/15/20. Patient was discharged home on 11/17/20 and is now returning to outpatient PT. She has had the stiches removed and is healing well. Denies any neck pain or stiffness. She lives with her daughter and has 24/7 caregiver support. She is still using RW for most ambulation. She is able to  transfer to bedside commode well and is able to stand short time unsupported. She reports feeling very weak and fatigued. She reports she has been dragging her right foot more. She denies any numbness/tingling; She reports feeling like her legs are wanting to do more but is hesitant because of fear of falling. She is mod I for self care morning routine. She does still receive help with showering. She has been working on increasing her activity but denies any formal exercise.    Limitations Standing;Walking    How long can you sit comfortably? NA    How long can you stand comfortably? 30-60 min with support    How long can you walk comfortably? about 100 feet with RW, still limited to short distances;    Diagnostic tests MRI shows left basal ganglia infarct 10/21/20    Patient Stated Goals "Improve balance and walking and not need RW, get back to independent."    Currently in Pain? No/denies    Pain Onset In the past 7 days                     TREATMENT:       INTERVENTIONS - gait belt donned and CGA used with all upright interventions unless otherwise specified     Ambulation with hurrycane and Min A to CGA x150 feet with cues for sequencing and coordination. Patient able to exhibit better coordination being able to achieve 2 point gait pattern with less cues this session; She does ambulate with increased RLE foot drag and decreased DF at heel strike;       NMR: Standing on 1/2 bolster (Flat side up): -heel/toe raise x15 reps with BUE rail assist -Progressed to feet apart, neutral position with BUE wand flexion x10 reps, required min A for safety -tandem stance 10 sec hold x2 reps each foot in front with 2-0- rail assist, required min A for safety with mod difficulty reported, min VCs to increase neutral weight shift and erect posture;   Advanced HEP Forward/backward walking with 1 rail assist x10 feet x3 laps Standing on firm surface feet apart:  Head turns side/side x5 reps  with cues for gaze stabilization  Head turns up/down x5 reps Heel raises unsupported x10 reps Provided written HEP for better adherence;    Gait weaving around cones #4 with tripod base cane x2 sets with min A for safety with slower gait speed, able to exhibit better step length and sequencing;    Patient ambulated at end of session with tripod base cane with better reciprocal gait pattern and improved RLE foot clearance with less foot drag. She required close supervision to [facility] for safety;  Pt educated throughout session about proper posture and technique with exercises. Improved exercise technique, movement at target joints, use of target muscles after min to mod  verbal, visual, tactile cues.   Patient tolerated session well. She does report increased fatigue at end of session. Patient pleased with new HEP    Patient's condition has the potential to improve in response to therapy. Maximum improvement is yet to be obtained. The anticipated improvement is attainable and reasonable in a generally predictable time.  Patient reports adherence with HEP; Her goals were recently updated on 05/31/93- please see recert that day;                      PT Education - 05/20/21 0903     Education Details exercise technique/positioning;    Person(s) Educated Patient    Methods Explanation;Verbal cues    Comprehension Verbalized understanding;Returned demonstration;Verbal cues required;Need further instruction              PT Short Term Goals - 04/28/21 1152       PT SHORT TERM GOAL #1   Title Patient will be adherent to HEP at least 3x a week to improve functional strength and balance for better safety at home.    Baseline 9/21: Pt reports compliance with HEP and is confident, 1/3: doing exercise 2x a week    Time 4    Period Weeks    Status Partially Met    Target Date 05/26/21      PT SHORT TERM GOAL #2   Title Patient will be independent in transferring sit<>Stand  without pushing on arm rests to improve ability to get up from chair.    Baseline 9/21: pt able to complete STS without use of UEs, 1/3:able to stand but has moderate difficulty requiring increased time;    Time 4    Period Weeks    Status Partially Met    Target Date 05/26/21               PT Long Term Goals - 04/28/21 1153       PT LONG TERM GOAL #1   Title Patient (> 10 years old) will complete five times sit to stand test in < 15 seconds indicating an increased LE strength and improved balance.    Baseline 9/21: 29 seconds hands-free 10/10: deferred 10/12: 34 sec hands free; 11/2: 23.8 sec hands-free; 12/7: 33.2 sec hands free, 1/3: 33.92 hands free    Time 8    Period Weeks    Status On-going    Target Date 06/23/21      PT LONG TERM GOAL #2   Title Patient will increase six minute walk test distance to >400 feet for improve gait ability    Baseline 9/21: 368 ft with RW; 10/10: deferred 10/12: 408 ft; 11/2: 476 ft with 4WW, 1/3: 475 feet with RW    Time 8    Period Weeks    Status Achieved    Target Date 06/23/21      PT LONG TERM GOAL #3   Title Patient will increase 10 meter walk test to >1.34ms as to improve gait speed for better community ambulation and to reduce fall risk.    Baseline 9/21: 0.5 m/s with RW; 10/10 deferred 10/12: 0.93 m/s with 46LO 11/2: 0.83 m/s with 4WW; 12/7: 0.52 m/s with RW, 1/3: 0.704    Time 8    Period Weeks    Status Partially Met    Target Date 06/23/21      PT LONG TERM GOAL #4   Title Patient will be independent with ascend/descend 6 steps using single  UE in step over step pattern without LOB.    Baseline 9/21:  Patient uses PT stairs (4 steps) with bilateral upper extremity support on handrails for both ascending/descending.  Patient exhibits reciprocal pattern ascending and step to pattern descending. 10/10: deferred; 10/12: ascending/descending recip. steps with BUE support; 11/2: Ascended/descended 8 steps total, reciprocal pattern  ascending and step-to descending. Pt uses UUE support throughout with CGA assist.; 12/7: Ascended and descended 8 steps total with UUE support and CGA. Reciprocal stepping ascending and step to descending., 1/3: Deferrred    Time 8    Period Weeks    Status Partially Met    Target Date 06/23/21      PT LONG TERM GOAL #5   Title Patient will demonstrate an improved Berg Balance Score of >45/56 as to demonstrate improved balance with ADLs such as sitting/standing and transfer balance and reduced fall risk.    Baseline 9/21: deferred d/t time; 9/29: 42/56; 10/10: deferred; 10/12: 44/56; 11/2: deferred; 11/7: 46/56, 1/3: deferred    Time 8    Period Weeks    Status Achieved    Target Date 06/23/21      PT LONG TERM GOAL #6   Title Patient will improve FOTO score to >60% to indicate improved functional mobility with ADLs.    Baseline 9/21: 59; 10/10: 57; 11/2: 60; 12/7: 58%, 1/3: 61%    Time 8    Period Weeks    Status Achieved    Target Date 06/23/21                   Plan - 05/20/21 7846     Clinical Impression Statement Patient motivated and participated well within session. She was instructed in advanced balance exercise to challenge ankle strategies and stance control. When walking at start of session, patient exhibits increased right foot drag. However following exercise at end of session she was able to exhibit better foot clearance with RLE heel strike. Patient instructed in advanced exercise for HEP. She would benefit from additional skilled PT Intervention to improve strength, balance and mobility;    Personal Factors and Comorbidities Age    Comorbidities HTN, CKD, fall risk, type 2 diabetes,    Examination-Activity Limitations Lift;Locomotion Level;Squat;Stairs;Stand;Toileting;Transfers;Bathing    Examination-Participation Restrictions Cleaning;Community Activity;Laundry;Meal Prep;Shop;Volunteer;Driving    Stability/Clinical Decision Making Stable/Uncomplicated     Rehab Potential Poor    PT Frequency 2x / week    PT Duration 8 weeks    PT Treatment/Interventions Cryotherapy;Electrical Stimulation;Moist Heat;Gait training;Stair training;Functional mobility training;Therapeutic activities;Therapeutic exercise;Neuromuscular re-education;Balance training;Patient/family education;Orthotic Fit/Training;Energy conservation    PT Next Visit Plan work on LE strengthening and balance; gait training, endurance, continue POC as previously indicated    PT Home Exercise Plan Access Code: [email or web address]; no updates    Consulted and Agree with Plan of Care Patient             Patient will benefit from skilled therapeutic intervention in order to improve the following deficits and impairments:  Abnormal gait, Decreased balance, Decreased endurance, Decreased mobility, Difficulty walking, Cardiopulmonary status limiting activity, Decreased activity tolerance, Decreased coordination, Decreased strength  Visit Diagnosis: Muscle weakness (generalized)  Other lack of coordination  Unsteadiness on feet  Other abnormalities of gait and mobility  Difficulty in walking, not elsewhere classified  Abnormality of gait and mobility     Problem List Patient Active Problem List   Diagnosis Date Noted   Carotid stenosis, symptomatic, with infarction ([place]) 11/13/2020   Gout flare 11/10/2020  UTI (urinary tract infection) 11/10/2020   Left carotid artery stenosis 11/10/2020   Chronic kidney disease 11/10/2020   Left basal ganglia embolic stroke ([place]) 87/68/1157   PAC (premature atrial contraction)    Pre-procedural cardiovascular examination    Ischemic stroke ([place]) 10/20/2020   TIA (transient ischemic attack) 10/19/2020   Right hemiparesis ([place]) 10/19/2020   Type 2 diabetes mellitus with stage 3 chronic kidney disease, without long-term current use of insulin ([place]) 08/24/2018   Hyperlipidemia, unspecified 07/19/2017   Hypertension 07/19/2017   PUD (peptic ulcer  disease) 07/19/2017   Type 2 diabetes mellitus ([place]) 07/19/2017   Personal history of gout 08/12/2016   Anemia, unspecified 04/09/2016   Hypothyroidism, acquired 12/10/2015    Tranquilino Fischler, PT, DPT 05/20/2021, 10:10 AM  [place] [facility] SERVICES [place], [place], [address] Phone: [phone]   Fax:  [phone]  Name: Cassandra Schaefer MRN: [id number] Date of Birth: April 05, 1929

## 2021-05-21 ENCOUNTER — Ambulatory Visit: Payer: Medicare HMO | Admitting: Physical Therapy

## 2021-05-21 ENCOUNTER — Encounter: Payer: Self-pay | Admitting: Physical Therapy

## 2021-05-21 ENCOUNTER — Encounter: Payer: Self-pay | Admitting: Occupational Therapy

## 2021-05-21 ENCOUNTER — Other Ambulatory Visit: Payer: Self-pay

## 2021-05-21 ENCOUNTER — Ambulatory Visit: Payer: Medicare HMO | Admitting: Occupational Therapy

## 2021-05-21 ENCOUNTER — Encounter: Payer: Medicare HMO | Admitting: Speech Pathology

## 2021-05-21 DIAGNOSIS — R278 Other lack of coordination: Secondary | ICD-10-CM

## 2021-05-21 DIAGNOSIS — M6281 Muscle weakness (generalized): Secondary | ICD-10-CM

## 2021-05-21 DIAGNOSIS — R2689 Other abnormalities of gait and mobility: Secondary | ICD-10-CM

## 2021-05-21 DIAGNOSIS — R262 Difficulty in walking, not elsewhere classified: Secondary | ICD-10-CM

## 2021-05-21 DIAGNOSIS — R2681 Unsteadiness on feet: Secondary | ICD-10-CM

## 2021-05-21 NOTE — Therapy (Signed)
[place] MAIN [facility] SERVICES [address] [place], [place], [address] Phone: [phone]   Fax:  [phone]  Physical Therapy Treatment  Patient Details  Name: Cassandra Schaefer MRN: [id number] Date of Birth: 10-Jan-1929 Referring Provider (PT): Dr. Dew/Dr. Ginette Pitman PCP   Encounter Date: 05/21/2021   PT End of Session - 05/21/21 1203     Visit Number 41    Number of Visits 6    Date for PT Re-Evaluation 06/23/21    Authorization Type [employer]    Authorization Time Period 02/02/21-04/27/21    Progress Note Due on Visit 42    PT Start Time 1146    PT Stop Time 1230    PT Time Calculation (min) 44 min    Equipment Utilized During Treatment Gait belt    Activity Tolerance Patient tolerated treatment well    Behavior During Therapy WFL for tasks assessed/performed             Past Medical History:  Diagnosis Date   Anemia    Cough    RESOLVING, NO FEVER   Diabetes mellitus without complication ([place])    Gout    Hypertension    Hypothyroidism    PUD (peptic ulcer disease)    Stroke [facility])    Wears dentures    full upper, partial lower    Past Surgical History:  Procedure Laterality Date   AMPUTATION TOE Left 12/14/2017   Procedure: AMPUTATION TOE-4TH MPJ;  Surgeon: Samara Deist, DPM;  Location: [place];  Service: Podiatry;  Laterality: Left;  IVA LOCAL Diabetic - oral meds   APPENDECTOMY     CATARACT EXTRACTION W/PHACO Right 06/23/2015   Procedure: CATARACT EXTRACTION PHACO AND INTRAOCULAR LENS PLACEMENT (IOC);  Surgeon: Estill Cotta, MD;  Location: [facility];  Service: Ophthalmology;  Laterality: Right;  [place] 01:28 AP% 23.4 CDE 36.14 fluid pack lot # [id number]   CATARACT EXTRACTION W/PHACO Left 07/28/2015   Procedure: CATARACT EXTRACTION PHACO AND INTRAOCULAR LENS PLACEMENT (IOC);  Surgeon: Estill Cotta, MD;  Location: [facility];  Service: Ophthalmology;  Laterality: Left;  [place]    1:29.7 AP%  24.9 CDE    41.32 fluid casette lot #[id number] H exp 09/23/2016   COONOSCOPY     AND ENDOSCOPY   DILATION AND CURETTAGE OF UTERUS     ENDARTERECTOMY Left 11/13/2020   Procedure: Evacuation of left neck hematoma;  Surgeon: Katha Cabal, MD;  Location: [facility];  Service: Vascular;  Laterality: Left;   ENDARTERECTOMY Left 11/13/2020   Procedure: ENDARTERECTOMY CAROTID;  Surgeon: Algernon Huxley, MD;  Location: [facility];  Service: Vascular;  Laterality: Left;   TONSILLECTOMY     TUBAL LIGATION      There were no vitals filed for this visit.   Subjective Assessment - 05/21/21 1203     Subjective Patient reports doing well; No new complaints; HEP is going well- no questions/concerns    Patient is accompained by: --   Daughter, [name]   Pertinent History 88 y.o. female with medical history significant for type 2 diabetes mellitus, essential hypertension, acquired hypothyroidism, hyperlipidemia, stage III chronic kidney disease reports increased right sided weakness on 10/19/20. She was diagnosed with left basal ganglia ischemic stroke. She was discharged to inpatient rehab for 10 days and then discharged home. Patient was evaluated by outpatient PT on 11/12/20. CVA was due to carotid stenosis. She underwent left carotid endartectomy on 7/21. Procedure went well however patient suffered from left cervical hematoma and had  subsequent surgical procedure to stop the bleed and remove the hematoma on 11/13/20. They were concerned with her breathing and therefore intubated her for 1 day, she was in ICU for 2 days and transferred to med/surge on 11/15/20. Patient was discharged home on 11/17/20 and is now returning to outpatient PT. She has had the stiches removed and is healing well. Denies any neck pain or stiffness. She lives with her daughter and has 24/7 caregiver support. She is still using RW for most ambulation. She is able to transfer to bedside commode well and is able to stand short time unsupported. She reports feeling  very weak and fatigued. She reports she has been dragging her right foot more. She denies any numbness/tingling; She reports feeling like her legs are wanting to do more but is hesitant because of fear of falling. She is mod I for self care morning routine. She does still receive help with showering. She has been working on increasing her activity but denies any formal exercise.    Limitations Standing;Walking    How long can you sit comfortably? NA    How long can you stand comfortably? 30-60 min with support    How long can you walk comfortably? about 100 feet with RW, still limited to short distances;    Diagnostic tests MRI shows left basal ganglia infarct 10/21/20    Patient Stated Goals "Improve balance and walking and not need RW, get back to independent."    Currently in Pain? No/denies    Pain Onset In the past 7 days    Multiple Pain Sites No                    TREATMENT:       INTERVENTIONS - gait belt donned and CGA used with all upright interventions unless otherwise specified      NMR:    Ambulation with hurrycane and Min A to CGA x80 feet with cues for sequencing and coordination. Patient able to exhibit better coordination being able to achieve 2 point gait pattern with less cues this session; She does ambulate with slower gait speed  Ascend/descend 4 steps, ascend reciprocally, descend non-reciprocal x1 set with BUE rail assist, progressed to 1 set with 1 rail assist;      Gait weaving around stepping stones and cones #6 (spread out and close together) with tripod base cane x2 sets with min A for safety with slower gait speed, does exhibit increased difficulty when cones are close together and requires increased time/effort  Walking beside stones, touching them as if picking up #3, progressed to picking up cones with LUE #3  Had patient pick up small bad with LUE and carry 10 feet and place on counter, close supervision using tripod base cane with good  reciprocal gait pattern, slightly slower speed but fair balance/control;   Standing on airex pad: Feet apart unsupported:  Head turns side/side x5 reps  Progressed to arms across chest and trunk rotation side/side x5 reps Mini squat unsupported x10 reps; Patient required CGA for safety;     Pt educated throughout session about proper posture and technique with exercises. Improved exercise technique, movement at target joints, use of target muscles after min to mod verbal, visual, tactile cues.   Patient tolerated session well. She does report increased fatigue at end of session. Patient pleased with new HEP  PT Education - 05/21/21 1203     Education Details exercise technique/positioning;    Person(s) Educated Patient    Methods Explanation;Verbal cues    Comprehension Verbalized understanding;Returned demonstration;Verbal cues required;Need further instruction              PT Short Term Goals - 04/28/21 1152       PT SHORT TERM GOAL #1   Title Patient will be adherent to HEP at least 3x a week to improve functional strength and balance for better safety at home.    Baseline 9/21: Pt reports compliance with HEP and is confident, 1/3: doing exercise 2x a week    Time 4    Period Weeks    Status Partially Met    Target Date 05/26/21      PT SHORT TERM GOAL #2   Title Patient will be independent in transferring sit<>Stand without pushing on arm rests to improve ability to get up from chair.    Baseline 9/21: pt able to complete STS without use of UEs, 1/3:able to stand but has moderate difficulty requiring increased time;    Time 4    Period Weeks    Status Partially Met    Target Date 05/26/21               PT Long Term Goals - 04/28/21 1153       PT LONG TERM GOAL #1   Title Patient (> 53 years old) will complete five times sit to stand test in < 15 seconds indicating an increased LE strength and improved  balance.    Baseline 9/21: 29 seconds hands-free 10/10: deferred 10/12: 34 sec hands free; 11/2: 23.8 sec hands-free; 12/7: 33.2 sec hands free, 1/3: 33.92 hands free    Time 8    Period Weeks    Status On-going    Target Date 06/23/21      PT LONG TERM GOAL #2   Title Patient will increase six minute walk test distance to >400 feet for improve gait ability    Baseline 9/21: 368 ft with RW; 10/10: deferred 10/12: 408 ft; 11/2: 476 ft with 4WW, 1/3: 475 feet with RW    Time 8    Period Weeks    Status Achieved    Target Date 06/23/21      PT LONG TERM GOAL #3   Title Patient will increase 10 meter walk test to >1.38ms as to improve gait speed for better community ambulation and to reduce fall risk.    Baseline 9/21: 0.5 m/s with RW; 10/10 deferred 10/12: 0.93 m/s with 40DT 11/2: 0.83 m/s with 42IZ 12/7: 0.52 m/s with RW, 1/3: 0.704    Time 8    Period Weeks    Status Partially Met    Target Date 06/23/21      PT LONG TERM GOAL #4   Title Patient will be independent with ascend/descend 6 steps using single UE in step over step pattern without LOB.    Baseline 9/21:  Patient uses PT stairs (4 steps) with bilateral upper extremity support on handrails for both ascending/descending.  Patient exhibits reciprocal pattern ascending and step to pattern descending. 10/10: deferred; 10/12: ascending/descending recip. steps with BUE support; 11/2: Ascended/descended 8 steps total, reciprocal pattern ascending and step-to descending. Pt uses UUE support throughout with CGA assist.; 12/7: Ascended and descended 8 steps total with UUE support and CGA. Reciprocal stepping ascending and step to descending., 1/3: Deferrred    Time 8  Period Weeks    Status Partially Met    Target Date 06/23/21      PT LONG TERM GOAL #5   Title Patient will demonstrate an improved Berg Balance Score of >45/56 as to demonstrate improved balance with ADLs such as sitting/standing and transfer balance and reduced fall  risk.    Baseline 9/21: deferred d/t time; 9/29: 42/56; 10/10: deferred; 10/12: 44/56; 11/2: deferred; 11/7: 46/56, 1/3: deferred    Time 8    Period Weeks    Status Achieved    Target Date 06/23/21      PT LONG TERM GOAL #6   Title Patient will improve FOTO score to >60% to indicate improved functional mobility with ADLs.    Baseline 9/21: 59; 10/10: 57; 11/2: 60; 12/7: 58%, 1/3: 61%    Time 8    Period Weeks    Status Achieved    Target Date 06/23/21                   Plan - 05/21/21 1254     Clinical Impression Statement Patient motivated and participated well within session. She was instructed in gait safety negotiating obstacles. She does have a harder time walking in narrow spaces, weaving around cones. She exhibits a slower gait speed, short step length. Patient is progressing with gait with tripod cane progressing to close supervision when walking forward. Patient expressed desire to walk short distances at home with [employer] with family support. PT agreed as long as patient is having a good day and feeling strong. She was also able to work on picking up different objects with good control and support. Patient does require CGA when standing on compliant surfaces. She would benefit from additional skilled PT intervention to improve strength, balance and gait safety;    Personal Factors and Comorbidities Age    Comorbidities HTN, CKD, fall risk, type 2 diabetes,    Examination-Activity Limitations Lift;Locomotion Level;Squat;Stairs;Stand;Toileting;Transfers;Bathing    Examination-Participation Restrictions Cleaning;Community Activity;Laundry;Meal Prep;Shop;Volunteer;Driving    Stability/Clinical Decision Making Stable/Uncomplicated    Rehab Potential Poor    PT Frequency 2x / week    PT Duration 8 weeks    PT Treatment/Interventions Cryotherapy;Electrical Stimulation;Moist Heat;Gait training;Stair training;Functional mobility training;Therapeutic activities;Therapeutic  exercise;Neuromuscular re-education;Balance training;Patient/family education;Orthotic Fit/Training;Energy conservation    PT Next Visit Plan work on LE strengthening and balance; gait training, endurance, continue POC as previously indicated    PT Home Exercise Plan Access Code: [email or web address]; no updates    Consulted and Agree with Plan of Care Patient             Patient will benefit from skilled therapeutic intervention in order to improve the following deficits and impairments:  Abnormal gait, Decreased balance, Decreased endurance, Decreased mobility, Difficulty walking, Cardiopulmonary status limiting activity, Decreased activity tolerance, Decreased coordination, Decreased strength  Visit Diagnosis: Muscle weakness (generalized)  Other lack of coordination  Unsteadiness on feet  Other abnormalities of gait and mobility  Difficulty in walking, not elsewhere classified     Problem List Patient Active Problem List   Diagnosis Date Noted   Carotid stenosis, symptomatic, with infarction ([place]) 11/13/2020   Gout flare 11/10/2020   UTI (urinary tract infection) 11/10/2020   Left carotid artery stenosis 11/10/2020   Chronic kidney disease 11/10/2020   Left basal ganglia embolic stroke ([place]) 16/10/3708   PAC (premature atrial contraction)    Pre-procedural cardiovascular examination    Ischemic stroke ([place]) 10/20/2020   TIA (transient ischemic attack) 10/19/2020   Right hemiparesis (  [place]) 10/19/2020   Type 2 diabetes mellitus with stage 3 chronic kidney disease, without long-term current use of insulin ([place]) 08/24/2018   Hyperlipidemia, unspecified 07/19/2017   Hypertension 07/19/2017   PUD (peptic ulcer disease) 07/19/2017   Type 2 diabetes mellitus ([place]) 07/19/2017   Personal history of gout 08/12/2016   Anemia, unspecified 04/09/2016   Hypothyroidism, acquired 12/10/2015    Willis Holquin, PT, DPT 05/21/2021, 12:59 PM  [place]  MAIN [facility] SERVICES [place], [place], [address] Phone: [phone]   Fax:  [phone]  Name: Cassandra Schaefer MRN: [id number] Date of Birth: April 17, 1929

## 2021-05-21 NOTE — Therapy (Signed)
[place] [facility]  [facility] SERVICES [address] [place], [place], [address] Phone: [phone]   Fax:  [phone]  Occupational Therapy Treatment  Patient Details  Name: Cassandra Schaefer MRN: [id number] Date of Birth: 11/05/1928 Referring Provider (OT): Dr. Tracie Harrier   Encounter Date: 05/21/2021   OT End of Session - 05/21/21 1108     Visit Number 43    Number of Visits 63    Date for OT Re-Evaluation 07/14/21    Authorization Time Period Reporting period starting 02/16/2021    OT Start Time 1103    OT Stop Time 1145    OT Time Calculation (min) 42 min    Activity Tolerance Patient tolerated treatment well    Behavior During Therapy WFL for tasks assessed/performed             Past Medical History:  Diagnosis Date   Anemia    Cough    RESOLVING, NO FEVER   Diabetes mellitus without complication ([place])    Gout    Hypertension    Hypothyroidism    PUD (peptic ulcer disease)    Stroke [facility])    Wears dentures    full upper, partial lower    Past Surgical History:  Procedure Laterality Date   AMPUTATION TOE Left 12/14/2017   Procedure: AMPUTATION TOE-4TH MPJ;  Surgeon: Samara Deist, DPM;  Location: [place];  Service: Podiatry;  Laterality: Left;  IVA LOCAL Diabetic - oral meds   APPENDECTOMY     CATARACT EXTRACTION W/PHACO Right 06/23/2015   Procedure: CATARACT EXTRACTION PHACO AND INTRAOCULAR LENS PLACEMENT (IOC);  Surgeon: Estill Cotta, MD;  Location: [facility];  Service: Ophthalmology;  Laterality: Right;  [place] 01:28 AP% 23.4 CDE 36.14 fluid pack lot # [id number]   CATARACT EXTRACTION W/PHACO Left 07/28/2015   Procedure: CATARACT EXTRACTION PHACO AND INTRAOCULAR LENS PLACEMENT (IOC);  Surgeon: Estill Cotta, MD;  Location: [facility];  Service: Ophthalmology;  Laterality: Left;  [place]    1:29.7 AP%  24.9 CDE   41.32 fluid casette lot [id number] H exp 09/23/2016   COONOSCOPY     AND ENDOSCOPY   DILATION AND  CURETTAGE OF UTERUS     ENDARTERECTOMY Left 11/13/2020   Procedure: Evacuation of left neck hematoma;  Surgeon: Katha Cabal, MD;  Location: [facility];  Service: Vascular;  Laterality: Left;   ENDARTERECTOMY Left 11/13/2020   Procedure: ENDARTERECTOMY CAROTID;  Surgeon: Algernon Huxley, MD;  Location: [facility];  Service: Vascular;  Laterality: Left;   TONSILLECTOMY     TUBAL LIGATION      There were no vitals filed for this visit.   Subjective Assessment - 05/21/21 1106     Subjective  Pt. reports that she is able to use her toothbrush with her right hand, and brushing her hair.    Patient is accompanied by: Family member    Pertinent History R ankle pain, gout, T2DM, HTN, CKDIII; R/L carotid endarectomy            OT TREATMENT     Pt. performed right gross gripping with a gross grip strengthener. Pt. worked on sustaining grip while grasping pegs and reaching at various heights. The Gripper was set to  17.9# of grip strength resistance. Pt. worked on pinch strengthening in the right  hand for lateral, and 3pt. pinch using yellow, red, green, and blue resistive clips. Pt. Worked on moving the clips from lateral pinch position to 3 pt. Pinch positioning. Pt. worked on [employer]  motor control, and [place] skills grasping 1/2" marbles, and moving them through her hand from her palm to the tip of her 2nd digit, and thumb.    Pt. continues to make progress overall with right UE functioning.  Pt. is now able to use her right hand to drink from a cup without spilling, brush her teeth efficiently, and perform toilet hygiene skills thoroughly.  Pt. Continues to present with limited motor control, and coordination when challenged with adding speed to the task. Pt. Required increased time to move the clips from one position to the other pinch position. Pt. is improving with right hand function, and is now using her right hand more during ADL, and IADL tasks at home. Pt. continues to work on improving [facility] skills,  motor control, and accuracy with reaching in order to work towards improving right hand function.                       OT Education - 05/21/21 1107     Education Details RUE functioning.    Person(s) Educated Patient    Methods Explanation;Verbal cues;Demonstration;Tactile cues    Comprehension Verbalized understanding;Verbal cues required;Returned demonstration              OT Short Term Goals - 03/23/21 1657       OT SHORT TERM GOAL #1   Title Pt will perform HEP for RUE strength/coordination independently.    Baseline Eval: HEP not yet established in outpatient, but pt is working with putty from [name]; 12/04/2020; [facility] HEP initiated this day with mod vc after return demo; 01/05/2021: indep with currently program, but ongoing as pt progresses; 01/28/2021: vc to revisit and focus on grip and pinch strengthening with putty; 03/23/21: pt is indep with HEP    Time 6    Period Weeks    Status Achieved    Target Date 03/23/21               OT Long Term Goals - 04/30/21 1126       OT LONG TERM GOAL #1   Title Pt will improve hand writing to 100% legibility with R hand to be able to independently write a check.    Baseline Eval: signature is 75% legible with R hand, requiring extra time; 12/04/2020: no chan to complete.ge from eval; 01/05/2021: Printing is 100% legible, signature is 90% legible, but still requires extra time and effort.; 01/26/21: Signature is 90% legible and speed/fluidity have improved. 20th visit: 90% legible, 03/23/21: 90% legible, extra time 40th visit 90 % legibility with increased time    Time 12    Period Weeks    Status On-going    Target Date 07/14/21      OT LONG TERM GOAL #2   Title Pt will improve R hand coordination to enable good manipulation of iphone using R dominant hand    Baseline Eval: Pt uses L hand d/t R hand lacking sufficient [place] to press buttons on phone; 12/04/2020: no change from eval; 01/05/2021: R hand coordination is  improving but pt still uses L hand to dial phone; 01/28/21: Pt using R dominant hand to press buttons and apps on phone 50% of the time.20th visit: Right hand [facility] skilols are improving, Pt. is improving with manipulation of the iphone; 03/23/21: Pt now consistently using R hand to press buttons on phone with good accuracy.40th visit: Pt. continues to have difficulty modifying the accurate amount of pressure needed to wipe the phone.  Time 12    Period Weeks    Status On-going    Target Date 07/14/21      OT LONG TERM GOAL #3   Title Pt will improve GMC throughout RUE to enable pt to reach for and pick up ADL supplies with R dominant hand without dropping or knocking over objects.    Baseline Eval: Pt reports RUE is clumsy and easily knocks over objects when trying to reach for ADL supplies.  Pt verbalizes that her "aim is off."; 12/04/2020: pt reports slight improvement since eval and has started using her R hand to eat, but still feels clumsy; 01/05/2021: Greatly improved; pt is using silverware to eat with R hand, can comb hair with RUE, still mildly ataxic; 01/26/21: pt reports difficulty picking up objects within a small space (ie; may knock the toothpaste holder down when reaching for toothbrush), but reaching for light/larger objects is improving 20th visit: pt. continues to be mildly ataxic, however is consistently improving with motor control, and [place] while reaching targets; 03/23/21: Pt reaches with accuracy towards targets if she takes her time (pt drops and knocks things over when moving at a faster/normal pace) 04/23/2021: Pt. continues to present with difficulty with depth perception, and impaired [place].40th visit: Pt. is improving, however tends to knock over lighter weight objects.    Time 12    Period Weeks    Status On-going    Target Date 07/14/21      OT LONG TERM GOAL #4   Title Pt will improve FOTO score to 68 or better to indicate measurable functional improvement.    Baseline Eval:  FOTO 55; 01/05/2021: FOTO 61; 01/28/21: FOTO 61, 20th visit: 61; 03/23/21: FOTO 60 4oth visit: FOTO score: 61    Time 12    Period Weeks    Status On-going    Target Date 07/14/21      OT LONG TERM GOAL #5   Title Pt will perform dynamic standing tasks with modified indep, AD as needed in order to reduce fall risk with ADLs                   Plan - 05/21/21 1108     Clinical Impression Statement Pt. continues to make progress overall with right UE functioning.  Pt. is now able to use her right hand to drink from a cup without spilling, brush her teeth efficiently, and perform toilet hygiene skills thoroughly.  Pt. Continues to present with limited motor control, and coordination when challenged with adding speed to the task. Pt. Required increased time to move the clips from one position to the other pinch position. Pt. is improving with right hand function, and is now using her right hand more during ADL, and IADL tasks at home. Pt. continues to work on improving [facility] skills, motor control, and accuracy with reaching in order to work towards improving right hand function.     OT Occupational Profile and History Detailed Assessment- Review of Records and additional review of physical, cognitive, psychosocial history related to current functional performance    Occupational performance deficits (Please refer to evaluation for details): ADL's;Leisure;IADL's    Body Structure / Function / Physical Skills ADL;Coordination;Endurance;GMC;UE functional use;Balance;Sensation;Body mechanics;IADL;Pain;Dexterity;FMC;Strength;Gait;Mobility    Rehab Potential Good    Clinical Decision Making Several treatment options, min-mod task modification necessary    Comorbidities Affecting Occupational Performance: May have comorbidities impacting occupational performance    Modification or Assistance to Complete Evaluation  Min-Moderate modification of tasks or  assist with assess necessary to complete eval     OT Frequency 2x / week    OT Duration 12 weeks    OT Treatment/Interventions Self-care/ADL training;Therapeutic exercise;DME and/or AE instruction;Functional Mobility Training;Balance training;Neuromuscular education;Therapeutic activities;Patient/family education    Consulted and Agree with Plan of Care Patient             Patient will benefit from skilled therapeutic intervention in order to improve the following deficits and impairments:   Body Structure / Function / Physical Skills: ADL, Coordination, Endurance, GMC, UE functional use, Balance, Sensation, Body mechanics, IADL, Pain, Dexterity, FMC, Strength, Gait, Mobility       Visit Diagnosis: Muscle weakness (generalized)  Other lack of coordination    Problem List Patient Active Problem List   Diagnosis Date Noted   Carotid stenosis, symptomatic, with infarction ([place]) 11/13/2020   Gout flare 11/10/2020   UTI (urinary tract infection) 11/10/2020   Left carotid artery stenosis 11/10/2020   Chronic kidney disease 11/10/2020   Left basal ganglia embolic stroke ([place]) [id number]   PAC (premature atrial contraction)    Pre-procedural cardiovascular examination    Ischemic stroke ([place]) 10/20/2020   TIA (transient ischemic attack) 10/19/2020   Right hemiparesis ([place]) 10/19/2020   Type 2 diabetes mellitus with stage 3 chronic kidney disease, without long-term current use of insulin ([place]) 08/24/2018   Hyperlipidemia, unspecified 07/19/2017   Hypertension 07/19/2017   PUD (peptic ulcer disease) 07/19/2017   Type 2 diabetes mellitus ([place]) 07/19/2017   Personal history of gout 08/12/2016   Anemia, unspecified 04/09/2016   Hypothyroidism, acquired 12/10/2015    Harrel Carina, MS, OTR/L 05/21/2021, 11:10 AM  [place], [place], [address] Phone: [phone]   Fax:  [phone]  Name: Cassandra Schaefer MRN: [id number] Date of Birth:  Oct 09, 1928

## 2021-05-26 ENCOUNTER — Ambulatory Visit: Payer: Medicare HMO | Admitting: Physical Therapy

## 2021-05-26 ENCOUNTER — Encounter: Payer: Self-pay | Admitting: Physical Therapy

## 2021-05-26 ENCOUNTER — Other Ambulatory Visit: Payer: Self-pay

## 2021-05-26 ENCOUNTER — Encounter: Payer: Medicare HMO | Admitting: Speech Pathology

## 2021-05-26 ENCOUNTER — Ambulatory Visit: Payer: Medicare HMO | Admitting: Occupational Therapy

## 2021-05-26 DIAGNOSIS — M6281 Muscle weakness (generalized): Secondary | ICD-10-CM

## 2021-05-26 DIAGNOSIS — R278 Other lack of coordination: Secondary | ICD-10-CM

## 2021-05-26 DIAGNOSIS — R2681 Unsteadiness on feet: Secondary | ICD-10-CM

## 2021-05-26 DIAGNOSIS — R262 Difficulty in walking, not elsewhere classified: Secondary | ICD-10-CM

## 2021-05-26 DIAGNOSIS — R482 Apraxia: Secondary | ICD-10-CM

## 2021-05-26 DIAGNOSIS — R269 Unspecified abnormalities of gait and mobility: Secondary | ICD-10-CM

## 2021-05-26 DIAGNOSIS — R2689 Other abnormalities of gait and mobility: Secondary | ICD-10-CM

## 2021-05-26 NOTE — Therapy (Signed)
[place] MAIN [facility] SERVICES [address] [place], [place], [address] Phone: [phone]   Fax:  [phone]  Physical Therapy Treatment  Patient Details  Name: Cassandra Schaefer MRN: [id number] Date of Birth: 05-22-1928 Referring Provider (PT): Dr. Dew/Dr. Ginette Pitman PCP   Encounter Date: 05/26/2021   PT End of Session - 05/26/21 1102     Visit Number 42    Number of Visits 55    Date for PT Re-Evaluation 06/23/21    Authorization Type [employer]    Authorization Time Period 02/02/21-04/27/21    Progress Note Due on Visit 41    PT Start Time 1102    PT Stop Time 1143    PT Time Calculation (min) 41 min    Equipment Utilized During Treatment Gait belt    Activity Tolerance Patient tolerated treatment well    Behavior During Therapy WFL for tasks assessed/performed             Past Medical History:  Diagnosis Date   Anemia    Cough    RESOLVING, NO FEVER   Diabetes mellitus without complication ([place])    Gout    Hypertension    Hypothyroidism    PUD (peptic ulcer disease)    Stroke [facility])    Wears dentures    full upper, partial lower    Past Surgical History:  Procedure Laterality Date   AMPUTATION TOE Left 12/14/2017   Procedure: AMPUTATION TOE-4TH MPJ;  Surgeon: Samara Deist, DPM;  Location: [place];  Service: Podiatry;  Laterality: Left;  IVA LOCAL Diabetic - oral meds   APPENDECTOMY     CATARACT EXTRACTION W/PHACO Right 06/23/2015   Procedure: CATARACT EXTRACTION PHACO AND INTRAOCULAR LENS PLACEMENT (IOC);  Surgeon: Estill Cotta, MD;  Location: [facility];  Service: Ophthalmology;  Laterality: Right;  [place] 01:28 AP% 23.4 CDE 36.14 fluid pack lot # [id number]   CATARACT EXTRACTION W/PHACO Left 07/28/2015   Procedure: CATARACT EXTRACTION PHACO AND INTRAOCULAR LENS PLACEMENT (IOC);  Surgeon: Estill Cotta, MD;  Location: [facility];  Service: Ophthalmology;  Laterality: Left;  [place]    1:29.7 AP%  24.9 CDE    41.32 fluid casette lot #[id number] H exp 09/23/2016   COONOSCOPY     AND ENDOSCOPY   DILATION AND CURETTAGE OF UTERUS     ENDARTERECTOMY Left 11/13/2020   Procedure: Evacuation of left neck hematoma;  Surgeon: Katha Cabal, MD;  Location: [facility];  Service: Vascular;  Laterality: Left;   ENDARTERECTOMY Left 11/13/2020   Procedure: ENDARTERECTOMY CAROTID;  Surgeon: Algernon Huxley, MD;  Location: [facility];  Service: Vascular;  Laterality: Left;   TONSILLECTOMY     TUBAL LIGATION      There were no vitals filed for this visit.   Subjective Assessment - 05/26/21 1107     Subjective Patient reports being busy this weekend, her granddaughter had a baby shower. She reports not being able to do a lot with so many people at her home. She denies any pain or soreness. Denies any stumbles or falls.    Patient is accompained by: --   Daughter, [name]   Pertinent History 88 y.o. female with medical history significant for type 2 diabetes mellitus, essential hypertension, acquired hypothyroidism, hyperlipidemia, stage III chronic kidney disease reports increased right sided weakness on 10/19/20. She was diagnosed with left basal ganglia ischemic stroke. She was discharged to inpatient rehab for 10 days and then discharged home. Patient was evaluated by outpatient PT on  11/12/20. CVA was due to carotid stenosis. She underwent left carotid endartectomy on 7/21. Procedure went well however patient suffered from left cervical hematoma and had subsequent surgical procedure to stop the bleed and remove the hematoma on 11/13/20. They were concerned with her breathing and therefore intubated her for 1 day, she was in ICU for 2 days and transferred to med/surge on 11/15/20. Patient was discharged home on 11/17/20 and is now returning to outpatient PT. She has had the stiches removed and is healing well. Denies any neck pain or stiffness. She lives with her daughter and has 24/7 caregiver support. She is still using RW for most  ambulation. She is able to transfer to bedside commode well and is able to stand short time unsupported. She reports feeling very weak and fatigued. She reports she has been dragging her right foot more. She denies any numbness/tingling; She reports feeling like her legs are wanting to do more but is hesitant because of fear of falling. She is mod I for self care morning routine. She does still receive help with showering. She has been working on increasing her activity but denies any formal exercise.    Limitations Standing;Walking    How long can you sit comfortably? NA    How long can you stand comfortably? 30-60 min with support    How long can you walk comfortably? about 100 feet with RW, still limited to short distances;    Diagnostic tests MRI shows left basal ganglia infarct 10/21/20    Patient Stated Goals "Improve balance and walking and not need RW, get back to independent."    Currently in Pain? No/denies    Pain Onset In the past 7 days    Multiple Pain Sites No                  TREATMENT:    INTERVENTIONS - gait belt donned and CGA used with all upright interventions unless otherwise specified    Exercise: Forward step ups with 2-1 rail assist x10 reps with CGA for safety;  Gait around gym x150 feet with tripod base cane requiring CGA for safety with cues for increased step length and to improve RLE foot clearance; Patient does report increased fatigue with prolonged walking;   NMR:   Pt ambulated in gym without AD requiring min A for safety x15 feet x2-3 sets with reciprocal gait pattern requiring cues to increase step length and improve gait speed;      Standing on airex pad: -alternate toe taps to 8 inch step (2nd step) with 1-0 rail assist x15 reps each LE; Standing one foot on airex, one foot on 8 inch step: Unsupported standing 15 sec hold x1 reps Unsupported standing: Reaching across body with cone with small ball and passing side/side x5 reps each foot on  step  *Pt required CGA to min A for safety with increased instability standing on RLE;  Standing on airex in open space- no support: -reaching down and picking up cones #3 and placing back on floor -forward step down to firm surface and then backward step up to airex pad x5 reps, unsupported with min A for safety;  Standing on 1/2 bolster (flat side up): -heel/toe rock x15 reps with BUE rail assist -feet apart, neutral position:   Head turns side/side x5 reps, minimal instability  Progressed to mini squat unsupported x10 reps requiring min A for safety with decreased ROM due to weakness/instability;     Pt educated throughout session about  proper posture and technique with exercises. Improved exercise technique, movement at target joints, use of target muscles after min to mod verbal, visual, tactile cues.   Patient tolerated session well. She does report increased fatigue at end of session. Patient pleased with HEP;                            PT Education - 05/26/21 1108     Education Details exercise technique/positioning;    Person(s) Educated Patient    Methods Explanation;Verbal cues    Comprehension Verbalized understanding;Returned demonstration;Verbal cues required;Need further instruction              PT Short Term Goals - 04/28/21 1152       PT SHORT TERM GOAL #1   Title Patient will be adherent to HEP at least 3x a week to improve functional strength and balance for better safety at home.    Baseline 9/21: Pt reports compliance with HEP and is confident, 1/3: doing exercise 2x a week    Time 4    Period Weeks    Status Partially Met    Target Date 05/26/21      PT SHORT TERM GOAL #2   Title Patient will be independent in transferring sit<>Stand without pushing on arm rests to improve ability to get up from chair.    Baseline 9/21: pt able to complete STS without use of UEs, 1/3:able to stand but has moderate difficulty requiring increased  time;    Time 4    Period Weeks    Status Partially Met    Target Date 05/26/21               PT Long Term Goals - 04/28/21 1153       PT LONG TERM GOAL #1   Title Patient (> 41 years old) will complete five times sit to stand test in < 15 seconds indicating an increased LE strength and improved balance.    Baseline 9/21: 29 seconds hands-free 10/10: deferred 10/12: 34 sec hands free; 11/2: 23.8 sec hands-free; 12/7: 33.2 sec hands free, 1/3: 33.92 hands free    Time 8    Period Weeks    Status On-going    Target Date 06/23/21      PT LONG TERM GOAL #2   Title Patient will increase six minute walk test distance to >400 feet for improve gait ability    Baseline 9/21: 368 ft with RW; 10/10: deferred 10/12: 408 ft; 11/2: 476 ft with 4WW, 1/3: 475 feet with RW    Time 8    Period Weeks    Status Achieved    Target Date 06/23/21      PT LONG TERM GOAL #3   Title Patient will increase 10 meter walk test to >1.40ms as to improve gait speed for better community ambulation and to reduce fall risk.    Baseline 9/21: 0.5 m/s with RW; 10/10 deferred 10/12: 0.93 m/s with 45OP 11/2: 0.83 m/s with 49YT 12/7: 0.52 m/s with RW, 1/3: 0.704    Time 8    Period Weeks    Status Partially Met    Target Date 06/23/21      PT LONG TERM GOAL #4   Title Patient will be independent with ascend/descend 6 steps using single UE in step over step pattern without LOB.    Baseline 9/21:  Patient uses PT stairs (4 steps) with bilateral upper extremity support  on handrails for both ascending/descending.  Patient exhibits reciprocal pattern ascending and step to pattern descending. 10/10: deferred; 10/12: ascending/descending recip. steps with BUE support; 11/2: Ascended/descended 8 steps total, reciprocal pattern ascending and step-to descending. Pt uses UUE support throughout with CGA assist.; 12/7: Ascended and descended 8 steps total with UUE support and CGA. Reciprocal stepping ascending and step to  descending., 1/3: Deferrred    Time 8    Period Weeks    Status Partially Met    Target Date 06/23/21      PT LONG TERM GOAL #5   Title Patient will demonstrate an improved Berg Balance Score of >45/56 as to demonstrate improved balance with ADLs such as sitting/standing and transfer balance and reduced fall risk.    Baseline 9/21: deferred d/t time; 9/29: 42/56; 10/10: deferred; 10/12: 44/56; 11/2: deferred; 11/7: 46/56, 1/3: deferred    Time 8    Period Weeks    Status Achieved    Target Date 06/23/21      PT LONG TERM GOAL #6   Title Patient will improve FOTO score to >60% to indicate improved functional mobility with ADLs.    Baseline 9/21: 59; 10/10: 57; 11/2: 60; 12/7: 58%, 1/3: 61%    Time 8    Period Weeks    Status Achieved    Target Date 06/23/21                   Plan - 05/26/21 1201     Clinical Impression Statement Patient motivated and participated well within session. She continues to have weakness in LE and occasional RLE foot drag. Patient was able to walk short distances this session without AD but does require min A for safety. She requires CGA when walking with tripod base cane. Patient instructed in advanced balance exercise to challenge stance control. She exhibits decreased ankle strategies and had a harder time stanidng on RLE. Patient would benefit from additional skilled PT Intervention to improve strength, balance and mobility;    Personal Factors and Comorbidities Age    Comorbidities HTN, CKD, fall risk, type 2 diabetes,    Examination-Activity Limitations Lift;Locomotion Level;Squat;Stairs;Stand;Toileting;Transfers;Bathing    Examination-Participation Restrictions Cleaning;Community Activity;Laundry;Meal Prep;Shop;Volunteer;Driving    Stability/Clinical Decision Making Stable/Uncomplicated    Rehab Potential Poor    PT Frequency 2x / week    PT Duration 8 weeks    PT Treatment/Interventions Cryotherapy;Electrical Stimulation;Moist Heat;Gait  training;Stair training;Functional mobility training;Therapeutic activities;Therapeutic exercise;Neuromuscular re-education;Balance training;Patient/family education;Orthotic Fit/Training;Energy conservation    PT Next Visit Plan work on LE strengthening and balance; gait training, endurance, continue POC as previously indicated    PT Home Exercise Plan Access Code: [email or web address]; no updates    Consulted and Agree with Plan of Care Patient             Patient will benefit from skilled therapeutic intervention in order to improve the following deficits and impairments:  Abnormal gait, Decreased balance, Decreased endurance, Decreased mobility, Difficulty walking, Cardiopulmonary status limiting activity, Decreased activity tolerance, Decreased coordination, Decreased strength  Visit Diagnosis: Muscle weakness (generalized)  Other lack of coordination  Unsteadiness on feet  Other abnormalities of gait and mobility  Difficulty in walking, not elsewhere classified  Abnormality of gait and mobility  Apraxia     Problem List Patient Active Problem List   Diagnosis Date Noted   Carotid stenosis, symptomatic, with infarction ([place]) 11/13/2020   Gout flare 11/10/2020   UTI (urinary tract infection) 11/10/2020   Left carotid artery stenosis 11/10/2020  Chronic kidney disease 11/10/2020   Left basal ganglia embolic stroke ([place]) 28/83/3744   PAC (premature atrial contraction)    Pre-procedural cardiovascular examination    Ischemic stroke ([place]) 10/20/2020   TIA (transient ischemic attack) 10/19/2020   Right hemiparesis ([place]) 10/19/2020   Type 2 diabetes mellitus with stage 3 chronic kidney disease, without long-term current use of insulin ([place]) 08/24/2018   Hyperlipidemia, unspecified 07/19/2017   Hypertension 07/19/2017   PUD (peptic ulcer disease) 07/19/2017   Type 2 diabetes mellitus ([place]) 07/19/2017   Personal history of gout 08/12/2016   Anemia, unspecified 04/09/2016    Hypothyroidism, acquired 12/10/2015    Matalyn Nawaz, PT, DPT 05/26/2021, 12:14 PM  [place], [place], [address] Phone: [phone]   Fax:  [phone]  Name: Cassandra Schaefer MRN: [id number] Date of Birth: 30-Dec-1928

## 2021-05-26 NOTE — Therapy (Deleted)
[place] MAIN [facility] SERVICES [address] [place], [place], [address] Phone: [phone]   Fax:  [phone]  Occupational Therapy Treatment  Patient Details  Name: Cassandra Schaefer MRN: [id number] Date of Birth: 1929-04-25 Referring Provider (OT): Dr. Tracie Harrier   Encounter Date: 05/26/2021   OT End of Session - 05/26/21 1201     Visit Number 80    Number of Visits 70    Date for OT Re-Evaluation 07/14/21    Authorization Time Period Reporting period starting 02/16/2021    OT Start Time 1145    OT Stop Time 1230    OT Time Calculation (min) 45 min    Activity Tolerance Patient tolerated treatment well    Behavior During Therapy WFL for tasks assessed/performed             Past Medical History:  Diagnosis Date   Anemia    Cough    RESOLVING, NO FEVER   Diabetes mellitus without complication ([place])    Gout    Hypertension    Hypothyroidism    PUD (peptic ulcer disease)    Stroke [facility])    Wears dentures    full upper, partial lower    Past Surgical History:  Procedure Laterality Date   AMPUTATION TOE Left 12/14/2017   Procedure: AMPUTATION TOE-4TH MPJ;  Surgeon: Samara Deist, DPM;  Location: [place];  Service: Podiatry;  Laterality: Left;  IVA LOCAL Diabetic - oral meds   APPENDECTOMY     CATARACT EXTRACTION W/PHACO Right 06/23/2015   Procedure: CATARACT EXTRACTION PHACO AND INTRAOCULAR LENS PLACEMENT (IOC);  Surgeon: Estill Cotta, MD;  Location: [facility];  Service: Ophthalmology;  Laterality: Right;  [place] 01:28 AP% 23.4 CDE 36.14 fluid pack lot # [id number]   CATARACT EXTRACTION W/PHACO Left 07/28/2015   Procedure: CATARACT EXTRACTION PHACO AND INTRAOCULAR LENS PLACEMENT (IOC);  Surgeon: Estill Cotta, MD;  Location: [facility];  Service: Ophthalmology;  Laterality: Left;  [place]    1:29.7 AP%  24.9 CDE   41.32 fluid casette lot [id number] H exp 09/23/2016   COONOSCOPY     AND ENDOSCOPY   DILATION AND  CURETTAGE OF UTERUS     ENDARTERECTOMY Left 11/13/2020   Procedure: Evacuation of left neck hematoma;  Surgeon: Katha Cabal, MD;  Location: [facility];  Service: Vascular;  Laterality: Left;   ENDARTERECTOMY Left 11/13/2020   Procedure: ENDARTERECTOMY CAROTID;  Surgeon: Algernon Huxley, MD;  Location: [facility];  Service: Vascular;  Laterality: Left;   TONSILLECTOMY     TUBAL LIGATION      There were no vitals filed for this visit.                           OT Short Term Goals - 03/23/21 1657       OT SHORT TERM GOAL #1   Title Pt will perform HEP for RUE strength/coordination independently.    Baseline Eval: HEP not yet established in outpatient, but pt is working with putty from [name]; 12/04/2020; [facility] HEP initiated this day with mod vc after return demo; 01/05/2021: indep with currently program, but ongoing as pt progresses; 01/28/2021: vc to revisit and focus on grip and pinch strengthening with putty; 03/23/21: pt is indep with HEP    Time 6    Period Weeks    Status Achieved    Target Date 03/23/21  OT Long Term Goals - 04/30/21 1126       OT LONG TERM GOAL #1   Title Pt will improve hand writing to 100% legibility with R hand to be able to independently write a check.    Baseline Eval: signature is 75% legible with R hand, requiring extra time; 12/04/2020: no chan to complete.ge from eval; 01/05/2021: Printing is 100% legible, signature is 90% legible, but still requires extra time and effort.; 01/26/21: Signature is 90% legible and speed/fluidity have improved. 20th visit: 90% legible, 03/23/21: 90% legible, extra time 40th visit 90 % legibility with increased time    Time 12    Period Weeks    Status On-going    Target Date 07/14/21      OT LONG TERM GOAL #2   Title Pt will improve R hand coordination to enable good manipulation of iphone using R dominant hand    Baseline Eval: Pt uses L hand d/t R hand lacking sufficient [place] to press  buttons on phone; 12/04/2020: no change from eval; 01/05/2021: R hand coordination is improving but pt still uses L hand to dial phone; 01/28/21: Pt using R dominant hand to press buttons and apps on phone 50% of the time.20th visit: Right hand [facility] skilols are improving, Pt. is improving with manipulation of the iphone; 03/23/21: Pt now consistently using R hand to press buttons on phone with good accuracy.40th visit: Pt. continues to have difficulty modifying the accurate amount of pressure needed to wipe the phone.    Time 12    Period Weeks    Status On-going    Target Date 07/14/21      OT LONG TERM GOAL #3   Title Pt will improve GMC throughout RUE to enable pt to reach for and pick up ADL supplies with R dominant hand without dropping or knocking over objects.    Baseline Eval: Pt reports RUE is clumsy and easily knocks over objects when trying to reach for ADL supplies.  Pt verbalizes that her "aim is off."; 12/04/2020: pt reports slight improvement since eval and has started using her R hand to eat, but still feels clumsy; 01/05/2021: Greatly improved; pt is using silverware to eat with R hand, can comb hair with RUE, still mildly ataxic; 01/26/21: pt reports difficulty picking up objects within a small space (ie; may knock the toothpaste holder down when reaching for toothbrush), but reaching for light/larger objects is improving 20th visit: pt. continues to be mildly ataxic, however is consistently improving with motor control, and [place] while reaching targets; 03/23/21: Pt reaches with accuracy towards targets if she takes her time (pt drops and knocks things over when moving at a faster/normal pace) 04/23/2021: Pt. continues to present with difficulty with depth perception, and impaired [place].40th visit: Pt. is improving, however tends to knock over lighter weight objects.    Time 12    Period Weeks    Status On-going    Target Date 07/14/21      OT LONG TERM GOAL #4   Title Pt will improve FOTO  score to 68 or better to indicate measurable functional improvement.    Baseline Eval: FOTO 55; 01/05/2021: FOTO 61; 01/28/21: FOTO 61, 20th visit: 61; 03/23/21: FOTO 60 4oth visit: FOTO score: 61    Time 12    Period Weeks    Status On-going    Target Date 07/14/21      OT LONG TERM GOAL #5   Title Pt will perform dynamic standing  tasks with modified indep, AD as needed in order to reduce fall risk with ADLs                   Plan - 05/26/21 1202     Clinical Impression Statement p    OT Occupational Profile and History Detailed Assessment- Review of Records and additional review of physical, cognitive, psychosocial history related to current functional performance    Occupational performance deficits (Please refer to evaluation for details): ADL's;Leisure;IADL's    Body Structure / Function / Physical Skills ADL;Coordination;Endurance;GMC;UE functional use;Balance;Sensation;Body mechanics;IADL;Pain;Dexterity;FMC;Strength;Gait;Mobility    Rehab Potential Good    Clinical Decision Making Several treatment options, min-mod task modification necessary    Comorbidities Affecting Occupational Performance: May have comorbidities impacting occupational performance    Modification or Assistance to Complete Evaluation  Min-Moderate modification of tasks or assist with assess necessary to complete eval    OT Frequency 2x / week    OT Duration 12 weeks    OT Treatment/Interventions Self-care/ADL training;Therapeutic exercise;DME and/or AE instruction;Functional Mobility Training;Balance training;Neuromuscular education;Therapeutic activities;Patient/family education    Consulted and Agree with Plan of Care Patient             Patient will benefit from skilled therapeutic intervention in order to improve the following deficits and impairments:   Body Structure / Function / Physical Skills: ADL, Coordination, Endurance, GMC, UE functional use, Balance, Sensation, Body mechanics, IADL,  Pain, Dexterity, FMC, Strength, Gait, Mobility       Visit Diagnosis: Muscle weakness (generalized)  Other lack of coordination    Problem List Patient Active Problem List   Diagnosis Date Noted   Carotid stenosis, symptomatic, with infarction ([place]) 11/13/2020   Gout flare 11/10/2020   UTI (urinary tract infection) 11/10/2020   Left carotid artery stenosis 11/10/2020   Chronic kidney disease 11/10/2020   Left basal ganglia embolic stroke ([place]) [id number]   PAC (premature atrial contraction)    Pre-procedural cardiovascular examination    Ischemic stroke ([place]) 10/20/2020   TIA (transient ischemic attack) 10/19/2020   Right hemiparesis ([place]) 10/19/2020   Type 2 diabetes mellitus with stage 3 chronic kidney disease, without long-term current use of insulin ([place]) 08/24/2018   Hyperlipidemia, unspecified 07/19/2017   Hypertension 07/19/2017   PUD (peptic ulcer disease) 07/19/2017   Type 2 diabetes mellitus ([place]) 07/19/2017   Personal history of gout 08/12/2016   Anemia, unspecified 04/09/2016   Hypothyroidism, acquired 12/10/2015    Harrel Carina, OT 05/26/2021, 12:06 PM  [place] MAIN [facility] SERVICES [address] [place], [place], [address] Phone: [phone]   Fax:  [phone]  Name: Zandra Luy MRN: [id number] Date of Birth: 06-05-1928

## 2021-05-26 NOTE — Therapy (Signed)
[place] MAIN [facility] SERVICES [address] [place], [place], [address] Phone: [phone]   Fax:  [phone]  Occupational Therapy Treatment  Patient Details  Name: Cassandra Schaefer MRN: [id number] Date of Birth: 05/08/1928 Referring Provider (OT): Dr. Tracie Harrier   Encounter Date: 05/26/2021   OT End of Session - 05/26/21 1201     Visit Number 20    Number of Visits 68    Date for OT Re-Evaluation 07/14/21    Authorization Time Period Reporting period starting 02/16/2021    OT Start Time 1145    OT Stop Time 1230    OT Time Calculation (min) 45 min    Activity Tolerance Patient tolerated treatment well    Behavior During Therapy WFL for tasks assessed/performed             Past Medical History:  Diagnosis Date   Anemia    Cough    RESOLVING, NO FEVER   Diabetes mellitus without complication ([place])    Gout    Hypertension    Hypothyroidism    PUD (peptic ulcer disease)    Stroke [facility])    Wears dentures    full upper, partial lower    Past Surgical History:  Procedure Laterality Date   AMPUTATION TOE Left 12/14/2017   Procedure: AMPUTATION TOE-4TH MPJ;  Surgeon: Samara Deist, DPM;  Location: [place];  Service: Podiatry;  Laterality: Left;  IVA LOCAL Diabetic - oral meds   APPENDECTOMY     CATARACT EXTRACTION W/PHACO Right 06/23/2015   Procedure: CATARACT EXTRACTION PHACO AND INTRAOCULAR LENS PLACEMENT (IOC);  Surgeon: Estill Cotta, MD;  Location: [facility];  Service: Ophthalmology;  Laterality: Right;  [place] 01:28 AP% 23.4 CDE 36.14 fluid pack lot # [id number]   CATARACT EXTRACTION W/PHACO Left 07/28/2015   Procedure: CATARACT EXTRACTION PHACO AND INTRAOCULAR LENS PLACEMENT (IOC);  Surgeon: Estill Cotta, MD;  Location: [facility];  Service: Ophthalmology;  Laterality: Left;  [place]    1:29.7 AP%  24.9 CDE   41.32 fluid casette lot [id number] H exp 09/23/2016   COONOSCOPY     AND ENDOSCOPY   DILATION AND  CURETTAGE OF UTERUS     ENDARTERECTOMY Left 11/13/2020   Procedure: Evacuation of left neck hematoma;  Surgeon: Katha Cabal, MD;  Location: [facility];  Service: Vascular;  Laterality: Left;   ENDARTERECTOMY Left 11/13/2020   Procedure: ENDARTERECTOMY CAROTID;  Surgeon: Algernon Huxley, MD;  Location: [facility];  Service: Vascular;  Laterality: Left;   TONSILLECTOMY     TUBAL LIGATION      There were no vitals filed for this visit.   OT TREATMENT     There. Ex.:   Pt. worked on [employer], and reciprocal motion using the UBE while seated for 8 min. with no resistance. Constant monitoring was provided.    Neuro muscular re-education:   Pt. worked on [facility] skills grasping 1" sticks, 1/4" collars, and 1/4" washers. Pt. worked on storing the objects in the palm, and translatory skills moving the items from the palm of the hand to the tip of the 2nd digit, and thumb. Pt. worked on removing the pegs using bilateral alternating hand patterns.    Pt. continues to make progress overall with her right UE functioning.  Pt. continues to present with impaired right hand motor control, and [place] with the most difficulty performing translatory movements with the right hand.Pt. continues to present with difficulty with [facility] when  further challenged with dual  tasking.  Pt. continues to work on improving [facility] skills, motor control, and accuracy with reaching in order to work towards improving right hand function, and improving/maximizing independence with ADLs, and IADLs                            OT Short Term Goals - 03/23/21 1657       OT SHORT TERM GOAL #1   Title Pt will perform HEP for RUE strength/coordination independently.    Baseline Eval: HEP not yet established in outpatient, but pt is working with putty from [name]; 12/04/2020; [facility] HEP initiated this day with mod vc after return demo; 01/05/2021: indep with currently program, but ongoing as pt progresses; 01/28/2021: vc to  revisit and focus on grip and pinch strengthening with putty; 03/23/21: pt is indep with HEP    Time 6    Period Weeks    Status Achieved    Target Date 03/23/21               OT Long Term Goals - 04/30/21 1126       OT LONG TERM GOAL #1   Title Pt will improve hand writing to 100% legibility with R hand to be able to independently write a check.    Baseline Eval: signature is 75% legible with R hand, requiring extra time; 12/04/2020: no chan to complete.ge from eval; 01/05/2021: Printing is 100% legible, signature is 90% legible, but still requires extra time and effort.; 01/26/21: Signature is 90% legible and speed/fluidity have improved. 20th visit: 90% legible, 03/23/21: 90% legible, extra time 40th visit 90 % legibility with increased time    Time 12    Period Weeks    Status On-going    Target Date 07/14/21      OT LONG TERM GOAL #2   Title Pt will improve R hand coordination to enable good manipulation of iphone using R dominant hand    Baseline Eval: Pt uses L hand d/t R hand lacking sufficient [place] to press buttons on phone; 12/04/2020: no change from eval; 01/05/2021: R hand coordination is improving but pt still uses L hand to dial phone; 01/28/21: Pt using R dominant hand to press buttons and apps on phone 50% of the time.20th visit: Right hand [facility] skilols are improving, Pt. is improving with manipulation of the iphone; 03/23/21: Pt now consistently using R hand to press buttons on phone with good accuracy.40th visit: Pt. continues to have difficulty modifying the accurate amount of pressure needed to wipe the phone.    Time 12    Period Weeks    Status On-going    Target Date 07/14/21      OT LONG TERM GOAL #3   Title Pt will improve GMC throughout RUE to enable pt to reach for and pick up ADL supplies with R dominant hand without dropping or knocking over objects.    Baseline Eval: Pt reports RUE is clumsy and easily knocks over objects when trying to reach for ADL supplies.   Pt verbalizes that her "aim is off."; 12/04/2020: pt reports slight improvement since eval and has started using her R hand to eat, but still feels clumsy; 01/05/2021: Greatly improved; pt is using silverware to eat with R hand, can comb hair with RUE, still mildly ataxic; 01/26/21: pt reports difficulty picking up objects within a small space (ie; may knock the toothpaste holder down when reaching for toothbrush), but reaching for light/larger  objects is improving 20th visit: pt. continues to be mildly ataxic, however is consistently improving with motor control, and [place] while reaching targets; 03/23/21: Pt reaches with accuracy towards targets if she takes her time (pt drops and knocks things over when moving at a faster/normal pace) 04/23/2021: Pt. continues to present with difficulty with depth perception, and impaired [place].40th visit: Pt. is improving, however tends to knock over lighter weight objects.    Time 12    Period Weeks    Status On-going    Target Date 07/14/21      OT LONG TERM GOAL #4   Title Pt will improve FOTO score to 68 or better to indicate measurable functional improvement.    Baseline Eval: FOTO 55; 01/05/2021: FOTO 61; 01/28/21: FOTO 61, 20th visit: 61; 03/23/21: FOTO 60 4oth visit: FOTO score: 61    Time 12    Period Weeks    Status On-going    Target Date 07/14/21      OT LONG TERM GOAL #5   Title Pt will perform dynamic standing tasks with modified indep, AD as needed in order to reduce fall risk with ADLs                   Plan - 05/26/21 1202     Clinical Impression Statement Pt. continues to make progress overall with her right UE functioning.  Pt. continues to present with impaired right hand motor control, and [place] with the most difficulty performing translatory movements with the right hand.Pt. continues to present with difficulty with [facility] when  further challenged with dual tasking.  Pt. continues to work on improving [employer] skills, motor control, and accuracy  with reaching in order to work towards improving right hand function, and improving/maximizing independence with ADLs, and IADLs     OT Occupational Profile and History Detailed Assessment- Review of Records and additional review of physical, cognitive, psychosocial history related to current functional performance    Occupational performance deficits (Please refer to evaluation for details): ADL's;Leisure;IADL's    Body Structure / Function / Physical Skills ADL;Coordination;Endurance;GMC;UE functional use;Balance;Sensation;Body mechanics;IADL;Pain;Dexterity;FMC;Strength;Gait;Mobility    Rehab Potential Good    Clinical Decision Making Several treatment options, min-mod task modification necessary    Comorbidities Affecting Occupational Performance: May have comorbidities impacting occupational performance    Modification or Assistance to Complete Evaluation  Min-Moderate modification of tasks or assist with assess necessary to complete eval    OT Frequency 2x / week    OT Duration 12 weeks    OT Treatment/Interventions Self-care/ADL training;Therapeutic exercise;DME and/or AE instruction;Functional Mobility Training;Balance training;Neuromuscular education;Therapeutic activities;Patient/family education    Consulted and Agree with Plan of Care Patient             Patient will benefit from skilled therapeutic intervention in order to improve the following deficits and impairments:   Body Structure / Function / Physical Skills: ADL, Coordination, Endurance, GMC, UE functional use, Balance, Sensation, Body mechanics, IADL, Pain, Dexterity, FMC, Strength, Gait, Mobility       Visit Diagnosis: Muscle weakness (generalized)  Other lack of coordination    Problem List Patient Active Problem List   Diagnosis Date Noted   Carotid stenosis, symptomatic, with infarction ([place]) 11/13/2020   Gout flare 11/10/2020   UTI (urinary tract infection) 11/10/2020   Left carotid artery stenosis  11/10/2020   Chronic kidney disease 11/10/2020   Left basal ganglia embolic stroke ([place]) 123456   PAC (premature atrial contraction)    Pre-procedural cardiovascular examination  Ischemic stroke ([place]) 10/20/2020   TIA (transient ischemic attack) 10/19/2020   Right hemiparesis ([place]) 10/19/2020   Type 2 diabetes mellitus with stage 3 chronic kidney disease, without long-term current use of insulin ([place]) 08/24/2018   Hyperlipidemia, unspecified 07/19/2017   Hypertension 07/19/2017   PUD (peptic ulcer disease) 07/19/2017   Type 2 diabetes mellitus ([place]) 07/19/2017   Personal history of gout 08/12/2016   Anemia, unspecified 04/09/2016   Hypothyroidism, acquired 12/10/2015   Harrel Carina, MS, OTR/L   Harrel Carina, OT 05/26/2021, 12:07 PM  [place] MAIN [facility] SERVICES [address] [place], [place], [address] Phone: [phone]   Fax:  [phone]  Name: Cassandra Schaefer MRN: [id number] Date of Birth: 1929/01/11

## 2021-05-28 ENCOUNTER — Ambulatory Visit: Payer: Medicare HMO | Admitting: Occupational Therapy

## 2021-05-28 ENCOUNTER — Ambulatory Visit: Payer: Medicare HMO | Admitting: Physical Therapy

## 2021-05-28 ENCOUNTER — Encounter: Payer: Medicare HMO | Admitting: Speech Pathology

## 2021-06-02 ENCOUNTER — Ambulatory Visit: Payer: Medicare HMO | Attending: Internal Medicine | Admitting: Occupational Therapy

## 2021-06-02 ENCOUNTER — Ambulatory Visit: Payer: Medicare HMO

## 2021-06-02 ENCOUNTER — Other Ambulatory Visit: Payer: Self-pay

## 2021-06-02 ENCOUNTER — Encounter: Payer: Self-pay | Admitting: Occupational Therapy

## 2021-06-02 ENCOUNTER — Encounter: Payer: Medicare HMO | Admitting: Speech Pathology

## 2021-06-02 DIAGNOSIS — R2681 Unsteadiness on feet: Secondary | ICD-10-CM

## 2021-06-02 DIAGNOSIS — R278 Other lack of coordination: Secondary | ICD-10-CM | POA: Diagnosis present

## 2021-06-02 DIAGNOSIS — R269 Unspecified abnormalities of gait and mobility: Secondary | ICD-10-CM | POA: Diagnosis present

## 2021-06-02 DIAGNOSIS — R262 Difficulty in walking, not elsewhere classified: Secondary | ICD-10-CM | POA: Insufficient documentation

## 2021-06-02 DIAGNOSIS — M6281 Muscle weakness (generalized): Secondary | ICD-10-CM | POA: Insufficient documentation

## 2021-06-02 DIAGNOSIS — R2689 Other abnormalities of gait and mobility: Secondary | ICD-10-CM | POA: Insufficient documentation

## 2021-06-02 NOTE — Therapy (Signed)
[place] MAIN [facility] SERVICES [address] [place], [place], [address] Phone: [phone]   Fax:  [phone]  Occupational Therapy Treatment  Patient Details  Name: Cassandra Schaefer MRN: [id number] Date of Birth: [birth year] Referring Provider (OT): Dr. Tracie Harrier   Encounter Date: 06/02/2021   OT End of Session - 06/02/21 1110     Visit Number 32    Number of Visits 33    Date for OT Re-Evaluation 07/14/21    Authorization Time Period Reporting period starting 02/16/2021    OT Start Time 1107    OT Stop Time 1145    OT Time Calculation (min) 38 min    Activity Tolerance Patient tolerated treatment well    Behavior During Therapy WFL for tasks assessed/performed             Past Medical History:  Diagnosis Date   Anemia    Cough    RESOLVING, NO FEVER   Diabetes mellitus without complication ([place])    Gout    Hypertension    Hypothyroidism    PUD (peptic ulcer disease)    Stroke [facility])    Wears dentures    full upper, partial lower    Past Surgical History:  Procedure Laterality Date   AMPUTATION TOE Left 12/14/2017   Procedure: AMPUTATION TOE-4TH MPJ;  Surgeon: Samara Deist, DPM;  Location: [place];  Service: Podiatry;  Laterality: Left;  IVA LOCAL Diabetic - oral meds   APPENDECTOMY     CATARACT EXTRACTION W/PHACO Right 06/23/2015   Procedure: CATARACT EXTRACTION PHACO AND INTRAOCULAR LENS PLACEMENT (IOC);  Surgeon: Estill Cotta, MD;  Location: [facility];  Service: Ophthalmology;  Laterality: Right;  [place] 01:28 AP% 23.4 CDE 36.14 fluid pack lot # [id number]   CATARACT EXTRACTION W/PHACO Left 07/28/2015   Procedure: CATARACT EXTRACTION PHACO AND INTRAOCULAR LENS PLACEMENT (IOC);  Surgeon: Estill Cotta, MD;  Location: [facility];  Service: Ophthalmology;  Laterality: Left;  [place]    1:29.7 AP%  24.9 CDE   41.32 fluid casette lot [id number] H exp 09/23/2016   COONOSCOPY     AND ENDOSCOPY   DILATION AND  CURETTAGE OF UTERUS     ENDARTERECTOMY Left 11/13/2020   Procedure: Evacuation of left neck hematoma;  Surgeon: Katha Cabal, MD;  Location: [facility];  Service: Vascular;  Laterality: Left;   ENDARTERECTOMY Left 11/13/2020   Procedure: ENDARTERECTOMY CAROTID;  Surgeon: Algernon Huxley, MD;  Location: [facility];  Service: Vascular;  Laterality: Left;   TONSILLECTOMY     TUBAL LIGATION      There were no vitals filed for this visit.   Subjective Assessment - 06/02/21 1110     Subjective  Pt. reports  having difficulty fastening pants to her pant hanger.   Patient is accompanied by: Family member    Pertinent History R ankle pain, gout, T2DM, HTN, CKDIII; R/L carotid endarectomy    Patient Stated Goals "I want to improve my aim when I'm using my R arm."    Currently in Pain? No/denies            OT TREATMENT    Neuro muscular re-education:  Pt. worked on grasping 1" resistive cubes alternating thumb opposition to the tip of the 2nd through 5th digits while the board is placed at a vertical angle. Pt. worked on pressing the cubes back into place while alternating isolated 2nd through 5th digit extension.    Therapeutic Exercise:  Pt. worked on Customer service manager  hanger clips sustaining her lateral pinch long enough to insert her pants. Pt. Reports having difficulty with sustaining Pt. worked on pinch strengthening in the left hand for lateral, and 3pt. pinch using yellow, red, green, and blue resistive clips. Pt. worked on placing the clips at various vertical and horizontal angles. Tactile and verbal cues were required for eliciting the desired movement.    Pt. reports that the wipers stopped working on her car when they attempted to come to therapy in the rain last Thursday causing her to have to cancel the appointment so that she could get them fixed. Pt. reports that she has been having difficulty with fastening her pants onto pant hangers. Pt. required increased cues for each step of  the cube task requiring her to change position of alternating thumb opposition to each digit.  Pt. continues to work on improving RUE functioning in order to work towards improving, and maximizing independence with ADLs, and IADL tasks.                         OT Education - 06/02/21 1110     Education Details RUE functioning.    Person(s) Educated Patient    Methods Explanation;Verbal cues;Demonstration;Tactile cues    Comprehension Verbalized understanding;Verbal cues required;Returned demonstration              OT Short Term Goals - 03/23/21 1657       OT SHORT TERM GOAL #1   Title Pt will perform HEP for RUE strength/coordination independently.    Baseline Eval: HEP not yet established in outpatient, but pt is working with putty from [name]; 12/04/2020; [facility] HEP initiated this day with mod vc after return demo; 01/05/2021: indep with currently program, but ongoing as pt progresses; 01/28/2021: vc to revisit and focus on grip and pinch strengthening with putty; 03/23/21: pt is indep with HEP    Time 6    Period Weeks    Status Achieved    Target Date 03/23/21               OT Long Term Goals - 04/30/21 1126       OT LONG TERM GOAL #1   Title Pt will improve hand writing to 100% legibility with R hand to be able to independently write a check.    Baseline Eval: signature is 75% legible with R hand, requiring extra time; 12/04/2020: no chan to complete.ge from eval; 01/05/2021: Printing is 100% legible, signature is 90% legible, but still requires extra time and effort.; 01/26/21: Signature is 90% legible and speed/fluidity have improved. 20th visit: 90% legible, 03/23/21: 90% legible, extra time 40th visit 90 % legibility with increased time    Time 12    Period Weeks    Status On-going    Target Date 07/14/21      OT LONG TERM GOAL #2   Title Pt will improve R hand coordination to enable good manipulation of iphone using R dominant hand    Baseline Eval: Pt  uses L hand d/t R hand lacking sufficient [place] to press buttons on phone; 12/04/2020: no change from eval; 01/05/2021: R hand coordination is improving but pt still uses L hand to dial phone; 01/28/21: Pt using R dominant hand to press buttons and apps on phone 50% of the time.20th visit: Right hand [facility] skilols are improving, Pt. is improving with manipulation of the iphone; 03/23/21: Pt now consistently using R hand to press buttons on phone  with good accuracy.40th visit: Pt. continues to have difficulty modifying the accurate amount of pressure needed to wipe the phone.    Time 12    Period Weeks    Status On-going    Target Date 07/14/21      OT LONG TERM GOAL #3   Title Pt will improve GMC throughout RUE to enable pt to reach for and pick up ADL supplies with R dominant hand without dropping or knocking over objects.    Baseline Eval: Pt reports RUE is clumsy and easily knocks over objects when trying to reach for ADL supplies.  Pt verbalizes that her "aim is off."; 12/04/2020: pt reports slight improvement since eval and has started using her R hand to eat, but still feels clumsy; 01/05/2021: Greatly improved; pt is using silverware to eat with R hand, can comb hair with RUE, still mildly ataxic; 01/26/21: pt reports difficulty picking up objects within a small space (ie; may knock the toothpaste holder down when reaching for toothbrush), but reaching for light/larger objects is improving 20th visit: pt. continues to be mildly ataxic, however is consistently improving with motor control, and [place] while reaching targets; 03/23/21: Pt reaches with accuracy towards targets if she takes her time (pt drops and knocks things over when moving at a faster/normal pace) 04/23/2021: Pt. continues to present with difficulty with depth perception, and impaired [place].40th visit: Pt. is improving, however tends to knock over lighter weight objects.    Time 12    Period Weeks    Status On-going    Target Date 07/14/21       OT LONG TERM GOAL #4   Title Pt will improve FOTO score to 68 or better to indicate measurable functional improvement.    Baseline Eval: FOTO 55; 01/05/2021: FOTO 61; 01/28/21: FOTO 61, 20th visit: 61; 03/23/21: FOTO 60 4oth visit: FOTO score: 61    Time 12    Period Weeks    Status On-going    Target Date 07/14/21      OT LONG TERM GOAL #5   Title Pt will perform dynamic standing tasks with modified indep, AD as needed in order to reduce fall risk with ADLs                   Plan - 06/02/21 1113     Clinical Impression Statement Pt. reports that the wipers stopped working on her car when they attempted to come to therapy in the rain last Thursday causing her to have to cancel the appointment so that she could get them fixed. Pt. reports that she has been having difficulty with fastening her pants onto pant hangers. Pt. required increased cues for each step of the cube task requiring her to change position of alternating thumb opposition to each digit.  Pt. continues to work on improving RUE functioning in order to work towards improving, and maximizing independence with ADLs, and IADL tasks.       OT Occupational Profile and History Detailed Assessment- Review of Records and additional review of physical, cognitive, psychosocial history related to current functional performance    Occupational performance deficits (Please refer to evaluation for details): ADL's;Leisure;IADL's    Body Structure / Function / Physical Skills ADL;Coordination;Endurance;GMC;UE functional use;Balance;Sensation;Body mechanics;IADL;Pain;Dexterity;FMC;Strength;Gait;Mobility    Rehab Potential Good    Clinical Decision Making Several treatment options, min-mod task modification necessary    Comorbidities Affecting Occupational Performance: May have comorbidities impacting occupational performance    Modification or Assistance to Complete Evaluation  Min-Moderate modification of tasks or assist with assess  necessary to complete eval    OT Frequency 2x / week    OT Duration 12 weeks    OT Treatment/Interventions Self-care/ADL training;Therapeutic exercise;DME and/or AE instruction;Functional Mobility Training;Balance training;Neuromuscular education;Therapeutic activities;Patient/family education    Consulted and Agree with Plan of Care Patient             Patient will benefit from skilled therapeutic intervention in order to improve the following deficits and impairments:   Body Structure / Function / Physical Skills: ADL, Coordination, Endurance, GMC, UE functional use, Balance, Sensation, Body mechanics, IADL, Pain, Dexterity, FMC, Strength, Gait, Mobility       Visit Diagnosis: Muscle weakness (generalized)  Other lack of coordination    Problem List Patient Active Problem List   Diagnosis Date Noted   Carotid stenosis, symptomatic, with infarction ([place]) 11/13/2020   Gout flare 11/10/2020   UTI (urinary tract infection) 11/10/2020   Left carotid artery stenosis 11/10/2020   Chronic kidney disease 11/10/2020   Left basal ganglia embolic stroke ([place]) [id number]   PAC (premature atrial contraction)    Pre-procedural cardiovascular examination    Ischemic stroke ([place]) 10/20/2020   TIA (transient ischemic attack) 10/19/2020   Right hemiparesis ([place]) 10/19/2020   Type 2 diabetes mellitus with stage 3 chronic kidney disease, without long-term current use of insulin ([place]) 08/24/2018   Hyperlipidemia, unspecified 07/19/2017   Hypertension 07/19/2017   PUD (peptic ulcer disease) 07/19/2017   Type 2 diabetes mellitus ([place]) 07/19/2017   Personal history of gout 08/12/2016   Anemia, unspecified 04/09/2016   Hypothyroidism, acquired 12/10/2015   Harrel Carina, MS, OTR/L   Harrel Carina, OT 06/02/2021, 11:19 AM  [place] MAIN [facility] SERVICES [address] [place], [place], [address] Phone: [phone]   Fax:  [phone]  Name:  Domique Jurkowski MRN: [id number] Date of Birth: [birth year]

## 2021-06-02 NOTE — Therapy (Signed)
[place] MAIN [facility] SERVICES [address] [place], [place], [address] Phone: [phone]   Fax:  [phone]  Physical Therapy Treatment  Patient Details  Name: Cassandra Schaefer MRN: [id number] Date of Birth: 12-22-1928 Referring Provider (PT): Dr. Dew/Dr. Ginette Schaefer PCP   Encounter Date: 06/02/2021   PT End of Session - 06/02/21 1214     Visit Number 43    Number of Visits 25    Date for PT Re-Evaluation 06/23/21    Authorization Type [employer]    Authorization Time Period 02/02/21-04/27/21    Progress Note Due on Visit 30    PT Start Time 1146    PT Stop Time 1229    PT Time Calculation (min) 43 min    Equipment Utilized During Treatment Gait belt    Activity Tolerance Patient tolerated treatment well    Behavior During Therapy WFL for tasks assessed/performed             Past Medical History:  Diagnosis Date   Anemia    Cough    RESOLVING, NO FEVER   Diabetes mellitus without complication ([place])    Gout    Hypertension    Hypothyroidism    PUD (peptic ulcer disease)    Stroke [facility])    Wears dentures    full upper, partial lower    Past Surgical History:  Procedure Laterality Date   AMPUTATION TOE Left 12/14/2017   Procedure: AMPUTATION TOE-4TH MPJ;  Surgeon: Cassandra Schaefer, DPM;  Location: [place];  Service: Podiatry;  Laterality: Left;  IVA LOCAL Diabetic - oral meds   APPENDECTOMY     CATARACT EXTRACTION W/PHACO Right 06/23/2015   Procedure: CATARACT EXTRACTION PHACO AND INTRAOCULAR LENS PLACEMENT (IOC);  Surgeon: Cassandra Cotta, MD;  Location: [facility];  Service: Ophthalmology;  Laterality: Right;  [place] 01:28 AP% 23.4 CDE 36.14 fluid pack lot # [id number]   CATARACT EXTRACTION W/PHACO Left 07/28/2015   Procedure: CATARACT EXTRACTION PHACO AND INTRAOCULAR LENS PLACEMENT (IOC);  Surgeon: Cassandra Cotta, MD;  Location: [facility];  Service: Ophthalmology;  Laterality: Left;  [place]    1:29.7 AP%  24.9 CDE    41.32 fluid casette lot #[id number] H exp 09/23/2016   COONOSCOPY     AND ENDOSCOPY   DILATION AND CURETTAGE OF UTERUS     ENDARTERECTOMY Left 11/13/2020   Procedure: Evacuation of left neck hematoma;  Surgeon: Cassandra Cabal, MD;  Location: [facility];  Service: Vascular;  Laterality: Left;   ENDARTERECTOMY Left 11/13/2020   Procedure: ENDARTERECTOMY CAROTID;  Surgeon: Cassandra Huxley, MD;  Location: [facility];  Service: Vascular;  Laterality: Left;   TONSILLECTOMY     TUBAL LIGATION      There were no vitals filed for this visit.   Subjective Assessment - 06/02/21 1213     Subjective Patient reports no falls or LOB since last session. Has been compliant with her exercises.    Patient is accompained by: --   Daughter, Cassandra Schaefer   Pertinent History 88 y.o. female with medical history significant for type 2 diabetes mellitus, essential hypertension, acquired hypothyroidism, hyperlipidemia, stage III chronic kidney disease reports increased right sided weakness on 10/19/20. She was diagnosed with left basal ganglia ischemic stroke. She was discharged to inpatient rehab for 10 days and then discharged home. Patient was evaluated by outpatient PT on 11/12/20. CVA was due to carotid stenosis. She underwent left carotid endartectomy on 7/21. Procedure went well however patient suffered from left cervical hematoma  and had subsequent surgical procedure to stop the bleed and remove the hematoma on 11/13/20. They were concerned with her breathing and therefore intubated her for 1 day, she was in ICU for 2 days and transferred to med/surge on 11/15/20. Patient was discharged home on 11/17/20 and is now returning to outpatient PT. She has had the stiches removed and is healing well. Denies any neck pain or stiffness. She lives with her daughter and has 24/7 caregiver support. She is still using RW for most ambulation. She is able to transfer to bedside commode well and is able to stand short time unsupported. She reports  feeling very weak and fatigued. She reports she has been dragging her right foot more. She denies any numbness/tingling; She reports feeling like her legs are wanting to do more but is hesitant because of fear of falling. She is mod I for self care morning routine. She does still receive help with showering. She has been working on increasing her activity but denies any formal exercise.    Limitations Standing;Walking    How long can you sit comfortably? NA    How long can you stand comfortably? 30-60 min with support    How long can you walk comfortably? about 100 feet with RW, still limited to short distances;    Diagnostic tests MRI shows left basal ganglia infarct 10/21/20    Patient Stated Goals "Improve balance and walking and not need RW, get back to independent."    Currently in Pain? No/denies                   TREATMENT:    INTERVENTIONS - gait belt donned and CGA used with all upright interventions unless otherwise specified    Exercise: 10x STS with cues for feet widened BOS Seated LAQ with rainbow ball adduction between feet 12x Forward step ups with 2-1 rail assist x10 reps with CGA for safety;   Gait around gym x150 feet with tripod base cane requiring CGA for safety with cues for increased step length and to improve RLE foot clearance; Patient does report increased fatigue with prolonged walking;; x2 sets     NMR:   Standing on airex pad: -alternate toe taps to 8 inch step (2nd step) with 1-0 rail assist x15 reps each LE; Standing one foot on airex, one foot on 8 inch step: Unsupported standing 15 sec hold x1 reps Unsupported standing: Reaching across body with cone with small ball and passing side/side x5 reps each foot on step  *Pt required CGA to min A for safety with increased instability standing on RLE;   Standing on airex SUE support: -three hedgehog taps 8x each LE  Standing on 1/2 bolster (flat side up): -heel/toe rock x15 reps with BUE rail  assist    Pt educated throughout session about proper posture and technique with exercises. Improved exercise technique, movement at target joints, use of target muscles after min to mod verbal, visual, tactile cues.   Patient tolerated session well. She does report increased fatigue at end of session. Patient pleased with HEP;        Patient demonstrates excellent motivation throughout physical therapy session. She is unstable with single limb balance on unstable surfaces indicating area for continued progress. Patient is able to perform two laps of ambulation with tripod based cane with close CGA and frequent cueing for reduction of foot drag. Patient would benefit from additional skilled PT Intervention to improve strength, balance and mobility  PT Education - 06/02/21 1214     Education Details exercise technique, body mechanics    Person(s) Educated Patient    Methods Explanation;Demonstration;Tactile cues;Verbal cues    Comprehension Verbalized understanding;Returned demonstration;Verbal cues required;Tactile cues required              PT Short Term Goals - 04/28/21 1152       PT SHORT TERM GOAL #1   Title Patient will be adherent to HEP at least 3x a week to improve functional strength and balance for better safety at home.    Baseline 9/21: Pt reports compliance with HEP and is confident, 1/3: doing exercise 2x a week    Time 4    Period Weeks    Status Partially Met    Target Date 05/26/21      PT SHORT TERM GOAL #2   Title Patient will be independent in transferring sit<>Stand without pushing on arm rests to improve ability to get up from chair.    Baseline 9/21: pt able to complete STS without use of UEs, 1/3:able to stand but has moderate difficulty requiring increased time;    Time 4    Period Weeks    Status Partially Met    Target Date 05/26/21               PT Long Term Goals - 04/28/21 1153       PT LONG TERM GOAL  #1   Title Patient (> 68 years old) will complete five times sit to stand test in < 15 seconds indicating an increased LE strength and improved balance.    Baseline 9/21: 29 seconds hands-free 10/10: deferred 10/12: 34 sec hands free; 11/2: 23.8 sec hands-free; 12/7: 33.2 sec hands free, 1/3: 33.92 hands free    Time 8    Period Weeks    Status On-going    Target Date 06/23/21      PT LONG TERM GOAL #2   Title Patient will increase six minute walk test distance to >400 feet for improve gait ability    Baseline 9/21: 368 ft with RW; 10/10: deferred 10/12: 408 ft; 11/2: 476 ft with 4WW, 1/3: 475 feet with RW    Time 8    Period Weeks    Status Achieved    Target Date 06/23/21      PT LONG TERM GOAL #3   Title Patient will increase 10 meter walk test to >1.49ms as to improve gait speed for better community ambulation and to reduce fall risk.    Baseline 9/21: 0.5 m/s with RW; 10/10 deferred 10/12: 0.93 m/s with 41CV 11/2: 0.83 m/s with 40DT 12/7: 0.52 m/s with RW, 1/3: 0.704    Time 8    Period Weeks    Status Partially Met    Target Date 06/23/21      PT LONG TERM GOAL #4   Title Patient will be independent with ascend/descend 6 steps using single UE in step over step pattern without LOB.    Baseline 9/21:  Patient uses PT stairs (4 steps) with bilateral upper extremity support on handrails for both ascending/descending.  Patient exhibits reciprocal pattern ascending and step to pattern descending. 10/10: deferred; 10/12: ascending/descending recip. steps with BUE support; 11/2: Ascended/descended 8 steps total, reciprocal pattern ascending and step-to descending. Pt uses UUE support throughout with CGA assist.; 12/7: Ascended and descended 8 steps total with UUE support and CGA. Reciprocal stepping ascending and step to descending., 1/3: Deferrred  Time 8    Period Weeks    Status Partially Met    Target Date 06/23/21      PT LONG TERM GOAL #5   Title Patient will demonstrate an  improved Berg Balance Score of >45/56 as to demonstrate improved balance with ADLs such as sitting/standing and transfer balance and reduced fall risk.    Baseline 9/21: deferred d/t time; 9/29: 42/56; 10/10: deferred; 10/12: 44/56; 11/2: deferred; 11/7: 46/56, 1/3: deferred    Time 8    Period Weeks    Status Achieved    Target Date 06/23/21      PT LONG TERM GOAL #6   Title Patient will improve FOTO score to >60% to indicate improved functional mobility with ADLs.    Baseline 9/21: 59; 10/10: 57; 11/2: 60; 12/7: 58%, 1/3: 61%    Time 8    Period Weeks    Status Achieved    Target Date 06/23/21                   Plan - 06/02/21 1247     Clinical Impression Statement Patient demonstrates excellent motivation throughout physical therapy session. She is unstable with single limb balance on unstable surfaces indicating area for continued progress. Patient is able to perform two laps of ambulation with tripod based cane with close CGA and frequent cueing for reduction of foot drag. Patient would benefit from additional skilled PT Intervention to improve strength, balance and mobility    Personal Factors and Comorbidities Age    Comorbidities HTN, CKD, fall risk, type 2 diabetes,    Examination-Activity Limitations Lift;Locomotion Level;Squat;Stairs;Stand;Toileting;Transfers;Bathing    Examination-Participation Restrictions Cleaning;Community Activity;Laundry;Meal Prep;Shop;Volunteer;Driving    Stability/Clinical Decision Making Stable/Uncomplicated    Rehab Potential Poor    PT Frequency 2x / week    PT Duration 8 weeks    PT Treatment/Interventions Cryotherapy;Electrical Stimulation;Moist Heat;Gait training;Stair training;Functional mobility training;Therapeutic activities;Therapeutic exercise;Neuromuscular re-education;Balance training;Patient/family education;Orthotic Fit/Training;Energy conservation    PT Next Visit Plan work on LE strengthening and balance; gait training,  endurance, continue POC as previously indicated    PT Home Exercise Plan Access Code: [email or web address]; no updates    Consulted and Agree with Plan of Care Patient             Patient will benefit from skilled therapeutic intervention in order to improve the following deficits and impairments:  Abnormal gait, Decreased balance, Decreased endurance, Decreased mobility, Difficulty walking, Cardiopulmonary status limiting activity, Decreased activity tolerance, Decreased coordination, Decreased strength  Visit Diagnosis: Muscle weakness (generalized)  Other lack of coordination  Unsteadiness on feet  Other abnormalities of gait and mobility     Problem List Patient Active Problem List   Diagnosis Date Noted   Carotid stenosis, symptomatic, with infarction ([place]) 11/13/2020   Gout flare 11/10/2020   UTI (urinary tract infection) 11/10/2020   Left carotid artery stenosis 11/10/2020   Chronic kidney disease 11/10/2020   Left basal ganglia embolic stroke ([place]) 40/34/7425   PAC (premature atrial contraction)    Pre-procedural cardiovascular examination    Ischemic stroke ([place]) 10/20/2020   TIA (transient ischemic attack) 10/19/2020   Right hemiparesis ([place]) 10/19/2020   Type 2 diabetes mellitus with stage 3 chronic kidney disease, without long-term current use of insulin ([place]) 08/24/2018   Hyperlipidemia, unspecified 07/19/2017   Hypertension 07/19/2017   PUD (peptic ulcer disease) 07/19/2017   Type 2 diabetes mellitus ([place]) 07/19/2017   Personal history of gout 08/12/2016   Anemia, unspecified 04/09/2016   Hypothyroidism,  acquired 12/10/2015    Janna Arch, PT, DPT  06/02/2021, 12:49 PM  [place] MAIN [facility] SERVICES [address] [place], [place], [address] Phone: [phone]   Fax:  [phone]  Name: Lindaann Gradilla MRN: [id number] Date of Birth: [birth year]

## 2021-06-04 ENCOUNTER — Ambulatory Visit: Payer: Medicare HMO | Admitting: Occupational Therapy

## 2021-06-04 ENCOUNTER — Ambulatory Visit: Payer: Medicare HMO | Admitting: Physical Therapy

## 2021-06-04 ENCOUNTER — Encounter: Payer: Self-pay | Admitting: Physical Therapy

## 2021-06-04 ENCOUNTER — Encounter: Payer: Self-pay | Admitting: Occupational Therapy

## 2021-06-04 ENCOUNTER — Other Ambulatory Visit: Payer: Self-pay

## 2021-06-04 ENCOUNTER — Encounter: Payer: Medicare HMO | Admitting: Speech Pathology

## 2021-06-04 DIAGNOSIS — R262 Difficulty in walking, not elsewhere classified: Secondary | ICD-10-CM

## 2021-06-04 DIAGNOSIS — R2681 Unsteadiness on feet: Secondary | ICD-10-CM

## 2021-06-04 DIAGNOSIS — R278 Other lack of coordination: Secondary | ICD-10-CM

## 2021-06-04 DIAGNOSIS — R269 Unspecified abnormalities of gait and mobility: Secondary | ICD-10-CM

## 2021-06-04 DIAGNOSIS — R2689 Other abnormalities of gait and mobility: Secondary | ICD-10-CM

## 2021-06-04 DIAGNOSIS — M6281 Muscle weakness (generalized): Secondary | ICD-10-CM

## 2021-06-04 NOTE — Therapy (Signed)
[place] [facility] SERVICES [place], [place], [address] Phone: [phone]   Fax:  [phone]  Occupational Therapy Treatment  Patient Details  Name: Cassandra Schaefer MRN: [id number] Date of Birth: 24-Dec-1928 Referring Provider (OT): Dr. Tracie Harrier   Encounter Date: 06/04/2021   OT End of Session - 06/04/21 1304     Visit Number [id number]    Number of Visits 27    Date for OT Re-Evaluation 07/14/21    Authorization Time Period Reporting period starting 02/16/2021    OT Start Time 1300    OT Stop Time 1345    OT Time Calculation (min) 45 min    Equipment Utilized During Treatment tranport chair    Activity Tolerance Patient tolerated treatment well    Behavior During Therapy WFL for tasks assessed/performed             Past Medical History:  Diagnosis Date   Anemia    Cough    RESOLVING, NO FEVER   Diabetes mellitus without complication ([place])    Gout    Hypertension    Hypothyroidism    PUD (peptic ulcer disease)    Stroke [facility])    Wears dentures    full upper, partial lower    Past Surgical History:  Procedure Laterality Date   AMPUTATION TOE Left 12/14/2017   Procedure: AMPUTATION TOE-4TH MPJ;  Surgeon: Samara Deist, DPM;  Location: [place];  Service: Podiatry;  Laterality: Left;  IVA LOCAL Diabetic - oral meds   APPENDECTOMY     CATARACT EXTRACTION W/PHACO Right 06/23/2015   Procedure: CATARACT EXTRACTION PHACO AND INTRAOCULAR LENS PLACEMENT (IOC);  Surgeon: Estill Cotta, MD;  Location: [facility];  Service: Ophthalmology;  Laterality: Right;  [place] 01:28 AP% 23.4 CDE 36.14 fluid pack lot # [id number]   CATARACT EXTRACTION W/PHACO Left 07/28/2015   Procedure: CATARACT EXTRACTION PHACO AND INTRAOCULAR LENS PLACEMENT (IOC);  Surgeon: Estill Cotta, MD;  Location: [facility];  Service: Ophthalmology;  Laterality: Left;  [place]    1:29.7 AP%  24.9 CDE   41.32 fluid casette lot [id number] H exp  09/23/2016   COONOSCOPY     AND ENDOSCOPY   DILATION AND CURETTAGE OF UTERUS     ENDARTERECTOMY Left 11/13/2020   Procedure: Evacuation of left neck hematoma;  Surgeon: Katha Cabal, MD;  Location: [facility];  Service: Vascular;  Laterality: Left;   ENDARTERECTOMY Left 11/13/2020   Procedure: ENDARTERECTOMY CAROTID;  Surgeon: Algernon Huxley, MD;  Location: [facility];  Service: Vascular;  Laterality: Left;   TONSILLECTOMY     TUBAL LIGATION      There were no vitals filed for this visit.   Subjective Assessment - 06/04/21 1303     Subjective  Pt. reports having difficulty fastening her pants onto a pant hanger.    Patient is accompanied by: Family member    Pertinent History R ankle pain, gout, T2DM, HTN, CKDIII; R/L carotid endarectomy    Currently in Pain? No/denies             OT TREATMENT     Neuro muscular re-education:   Pt. worked on grasping 1" resistive cubes alternating thumb opposition to the tip of the 2nd through 5th digits while the board is placed at a vertical angle. Pt. worked on pressing the cubes back into place while alternating isolated 2nd through 5th digit extension.  Pt. worked on formulating a mature grasp on tweezers, and sustaining the grasp while extending  her wrist.   Therapeutic Exercise:   Pt. worked on Manufacturing systems engineer clips sustaining her lateral pinch long enough to insert her pants. Pt. Reports having difficulty with sustaining Pt. worked on pinch strengthening in the left hand for lateral, and 3pt. pinch using yellow, red, green, and blue resistive clips. Pt. worked on placing the clips at various vertical and horizontal angles. Tactile and verbal cues were required for eliciting the desired movement.     Pt is improving with right pinch strengthening. Pt. continues to have difficulty grasping the pant hanger clip, and sustaining it in an open position while placing the pants onto the clip. Pt. required increased cues for each step of the  cube task requiring her to change position of alternating thumb opposition to each digit.  Pt. continues to work on improving RUE functioning in order to work towards improving, and maximizing independence with ADLs, and IADL tasks.                       OT Education - 06/04/21 1304     Education Details RUE functioning.    Person(s) Educated Patient    Methods Explanation;Verbal cues;Demonstration;Tactile cues    Comprehension Verbalized understanding;Verbal cues required;Returned demonstration              OT Short Term Goals - 03/23/21 1657       OT SHORT TERM GOAL #1   Title Pt will perform HEP for RUE strength/coordination independently.    Baseline Eval: HEP not yet established in outpatient, but pt is working with putty from [name]; 12/04/2020; [facility] HEP initiated this day with mod vc after return demo; 01/05/2021: indep with currently program, but ongoing as pt progresses; 01/28/2021: vc to revisit and focus on grip and pinch strengthening with putty; 03/23/21: pt is indep with HEP    Time 6    Period Weeks    Status Achieved    Target Date 03/23/21               OT Long Term Goals - 04/30/21 1126       OT LONG TERM GOAL #1   Title Pt will improve hand writing to 100% legibility with R hand to be able to independently write a check.    Baseline Eval: signature is 75% legible with R hand, requiring extra time; 12/04/2020: no chan to complete.ge from eval; 01/05/2021: Printing is 100% legible, signature is 90% legible, but still requires extra time and effort.; 01/26/21: Signature is 90% legible and speed/fluidity have improved. 20th visit: 90% legible, 03/23/21: 90% legible, extra time 40th visit 90 % legibility with increased time    Time 12    Period Weeks    Status On-going    Target Date 07/14/21      OT LONG TERM GOAL #2   Title Pt will improve R hand coordination to enable good manipulation of iphone using R dominant hand    Baseline Eval: Pt uses L  hand d/t R hand lacking sufficient [place] to press buttons on phone; 12/04/2020: no change from eval; 01/05/2021: R hand coordination is improving but pt still uses L hand to dial phone; 01/28/21: Pt using R dominant hand to press buttons and apps on phone 50% of the time.20th visit: Right hand [facility] skilols are improving, Pt. is improving with manipulation of the iphone; 03/23/21: Pt now consistently using R hand to press buttons on phone with good accuracy.40th visit: Pt. continues to have difficulty  modifying the accurate amount of pressure needed to wipe the phone.    Time 12    Period Weeks    Status On-going    Target Date 07/14/21      OT LONG TERM GOAL #3   Title Pt will improve GMC throughout RUE to enable pt to reach for and pick up ADL supplies with R dominant hand without dropping or knocking over objects.    Baseline Eval: Pt reports RUE is clumsy and easily knocks over objects when trying to reach for ADL supplies.  Pt verbalizes that her "aim is off."; 12/04/2020: pt reports slight improvement since eval and has started using her R hand to eat, but still feels clumsy; 01/05/2021: Greatly improved; pt is using silverware to eat with R hand, can comb hair with RUE, still mildly ataxic; 01/26/21: pt reports difficulty picking up objects within a small space (ie; may knock the toothpaste holder down when reaching for toothbrush), but reaching for light/larger objects is improving 20th visit: pt. continues to be mildly ataxic, however is consistently improving with motor control, and [place] while reaching targets; 03/23/21: Pt reaches with accuracy towards targets if she takes her time (pt drops and knocks things over when moving at a faster/normal pace) 04/23/2021: Pt. continues to present with difficulty with depth perception, and impaired [place].40th visit: Pt. is improving, however tends to knock over lighter weight objects.    Time 12    Period Weeks    Status On-going    Target Date 07/14/21      OT  LONG TERM GOAL #4   Title Pt will improve FOTO score to 68 or better to indicate measurable functional improvement.    Baseline Eval: FOTO 55; 01/05/2021: FOTO 61; 01/28/21: FOTO 61, 20th visit: 61; 03/23/21: FOTO 60 4oth visit: FOTO score: 61    Time 12    Period Weeks    Status On-going    Target Date 07/14/21      OT LONG TERM GOAL #5   Title Pt will perform dynamic standing tasks with modified indep, AD as needed in order to reduce fall risk with ADLs                   Plan - 06/04/21 1304     Clinical Impression Statement Pt is improving with right pinch strengthening. Pt. continues to have difficulty grasping the pant hanger clip, and sustaining it in an open position while placing the pants onto the clip. Pt. required increased cues for each step of the cube task requiring her to change position of alternating thumb opposition to each digit.  Pt. continues to work on improving RUE functioning in order to work towards improving, and maximizing independence with ADLs, and IADL tasks.      OT Occupational Profile and History Detailed Assessment- Review of Records and additional review of physical, cognitive, psychosocial history related to current functional performance    Occupational performance deficits (Please refer to evaluation for details): ADL's;Leisure;IADL's    Body Structure / Function / Physical Skills ADL;Coordination;Endurance;GMC;UE functional use;Balance;Sensation;Body mechanics;IADL;Pain;Dexterity;FMC;Strength;Gait;Mobility    Rehab Potential Good    Clinical Decision Making Several treatment options, min-mod task modification necessary    Comorbidities Affecting Occupational Performance: May have comorbidities impacting occupational performance    Modification or Assistance to Complete Evaluation  Min-Moderate modification of tasks or assist with assess necessary to complete eval    OT Frequency 2x / week    OT Duration 12 weeks  OT Treatment/Interventions  Self-care/ADL training;Therapeutic exercise;DME and/or AE instruction;Functional Mobility Training;Balance training;Neuromuscular education;Therapeutic activities;Patient/family education    Consulted and Agree with Plan of Care Patient             Patient will benefit from skilled therapeutic intervention in order to improve the following deficits and impairments:   Body Structure / Function / Physical Skills: ADL, Coordination, Endurance, GMC, UE functional use, Balance, Sensation, Body mechanics, IADL, Pain, Dexterity, FMC, Strength, Gait, Mobility       Visit Diagnosis: Muscle weakness (generalized)  Other lack of coordination    Problem List Patient Active Problem List   Diagnosis Date Noted   Carotid stenosis, symptomatic, with infarction ([place]) 11/13/2020   Gout flare 11/10/2020   UTI (urinary tract infection) 11/10/2020   Left carotid artery stenosis 11/10/2020   Chronic kidney disease 11/10/2020   Left basal ganglia embolic stroke ([place]) [id number]   PAC (premature atrial contraction)    Pre-procedural cardiovascular examination    Ischemic stroke ([place]) 10/20/2020   TIA (transient ischemic attack) 10/19/2020   Right hemiparesis ([place]) 10/19/2020   Type 2 diabetes mellitus with stage 3 chronic kidney disease, without long-term current use of insulin ([place]) 08/24/2018   Hyperlipidemia, unspecified 07/19/2017   Hypertension 07/19/2017   PUD (peptic ulcer disease) 07/19/2017   Type 2 diabetes mellitus ([place]) 07/19/2017   Personal history of gout 08/12/2016   Anemia, unspecified 04/09/2016   Hypothyroidism, acquired 12/10/2015   Harrel Carina, MS, OTR/L   Harrel Carina, OT 06/04/2021, 1:55 PM  [place] [address] [place], [place], [address] Phone: [phone]   Fax:  [phone]  Name: Cassandra Schaefer MRN: [id number] Date of Birth: 05-23-1928

## 2021-06-04 NOTE — Therapy (Signed)
[place] [facility] SERVICES [address] [place], [place], [address] Phone: [phone]   Fax:  [phone]  Physical Therapy Treatment  Patient Details  Name: Cassandra Schaefer MRN: [id number] Date of Birth: May 09, 1928 Referring Provider (PT): Dr. Dew/Dr. Ginette Pitman PCP   Encounter Date: 06/04/2021   PT End of Session - 06/04/21 1349     Visit Number 44    Number of Visits 56    Date for PT Re-Evaluation 06/23/21    Authorization Type [employer]    Authorization Time Period 02/02/21-04/27/21    Progress Note Due on Visit 24    PT Start Time 1348    PT Stop Time 1432    PT Time Calculation (min) 44 min    Equipment Utilized During Treatment Gait belt    Activity Tolerance Patient tolerated treatment well    Behavior During Therapy WFL for tasks assessed/performed             Past Medical History:  Diagnosis Date   Anemia    Cough    RESOLVING, NO FEVER   Diabetes mellitus without complication ([place])    Gout    Hypertension    Hypothyroidism    PUD (peptic ulcer disease)    Stroke [facility])    Wears dentures    full upper, partial lower    Past Surgical History:  Procedure Laterality Date   AMPUTATION TOE Left 12/14/2017   Procedure: AMPUTATION TOE-4TH MPJ;  Surgeon: Samara Deist, DPM;  Location: [place];  Service: Podiatry;  Laterality: Left;  IVA LOCAL Diabetic - oral meds   APPENDECTOMY     CATARACT EXTRACTION W/PHACO Right 06/23/2015   Procedure: CATARACT EXTRACTION PHACO AND INTRAOCULAR LENS PLACEMENT (IOC);  Surgeon: Estill Cotta, MD;  Location: [facility];  Service: Ophthalmology;  Laterality: Right;  [place] 01:28 AP% 23.4 CDE 36.14 fluid pack lot # [id number]   CATARACT EXTRACTION W/PHACO Left 07/28/2015   Procedure: CATARACT EXTRACTION PHACO AND INTRAOCULAR LENS PLACEMENT (IOC);  Surgeon: Estill Cotta, MD;  Location: [facility];  Service: Ophthalmology;  Laterality: Left;  [place]    1:29.7 AP%  24.9 CDE    41.32 fluid casette lot #[id number] H exp 09/23/2016   COONOSCOPY     AND ENDOSCOPY   DILATION AND CURETTAGE OF UTERUS     ENDARTERECTOMY Left 11/13/2020   Procedure: Evacuation of left neck hematoma;  Surgeon: Katha Cabal, MD;  Location: [facility];  Service: Vascular;  Laterality: Left;   ENDARTERECTOMY Left 11/13/2020   Procedure: ENDARTERECTOMY CAROTID;  Surgeon: Algernon Huxley, MD;  Location: [facility];  Service: Vascular;  Laterality: Left;   TONSILLECTOMY     TUBAL LIGATION      There were no vitals filed for this visit.   Subjective Assessment - 06/04/21 1348     Subjective patient reports soreness after last session. She reports, "I had missed a little therapy and I can tell. I was so sore after last time because it had been a while since I had exercised."    Patient is accompained by: --   Daughter, [name]   Pertinent History 88 y.o. female with medical history significant for type 2 diabetes mellitus, essential hypertension, acquired hypothyroidism, hyperlipidemia, stage III chronic kidney disease reports increased right sided weakness on 10/19/20. She was diagnosed with left basal ganglia ischemic stroke. She was discharged to inpatient rehab for 10 days and then discharged home. Patient was evaluated by outpatient PT on 11/12/20. CVA was due  to carotid stenosis. She underwent left carotid endartectomy on 7/21. Procedure went well however patient suffered from left cervical hematoma and had subsequent surgical procedure to stop the bleed and remove the hematoma on 11/13/20. They were concerned with her breathing and therefore intubated her for 1 day, she was in ICU for 2 days and transferred to med/surge on 11/15/20. Patient was discharged home on 11/17/20 and is now returning to outpatient PT. She has had the stiches removed and is healing well. Denies any neck pain or stiffness. She lives with her daughter and has 24/7 caregiver support. She is still using RW for most ambulation. She is able to  transfer to bedside commode well and is able to stand short time unsupported. She reports feeling very weak and fatigued. She reports she has been dragging her right foot more. She denies any numbness/tingling; She reports feeling like her legs are wanting to do more but is hesitant because of fear of falling. She is mod I for self care morning routine. She does still receive help with showering. She has been working on increasing her activity but denies any formal exercise.    Limitations Standing;Walking    How long can you sit comfortably? NA    How long can you stand comfortably? 30-60 min with support    How long can you walk comfortably? about 100 feet with RW, still limited to short distances;    Diagnostic tests MRI shows left basal ganglia infarct 10/21/20    Patient Stated Goals "Improve balance and walking and not need RW, get back to independent."    Currently in Pain? No/denies                     TREATMENT:    INTERVENTIONS - gait belt donned and CGA used with all upright interventions unless otherwise specified    Exercise: Sit<>Stand while holding small ball to challenge LE strengthening x5 reps, CGA for safety and cues for positioning including to keep feet back and feet apart;    Standing on bottom step: Hip flexion march against green tband with toe tap to 3rd step with B rail assist x10 reps each LE, moderate difficulty reported on RLE;     NMR:  Gait around gym x150 feet with tripod base cane requiring CGA for safety with cues for increased step length and to improve RLE foot clearance; Patient does report increased fatigue with prolonged walking; Gait on red mat on floor with tripod base cane x2 laps with min A for safety, slower gait speed with slight unsteadiness noted;     Standing on airex pad: -alternate toe taps to 8 inch step (2nd step) with 1-0 rail assist x10 reps each LE; Standing one foot on airex, one foot on 8 inch step: Unsupported standing  15 sec hold x1 reps Unsupported standing: trunk rotation x3 reps each direction  *Pt required CGA to min A for safety with increased instability standing on RLE;  Standing on airex pad: -heel raises unsupported 3 sec hold x10 reps, CGA for safety;   Forward/backward step ups firm to airex without UE assist x5 reps, min A with cues to increase foot clearance and increase hip flexion to avoid foot dragging on foam pad; Moderate difficulty reported;   Standing on firm surface: Stepping in various directions, forward, side, backward with single leg x5 reps each, reports most difficulty stepping with RLE with decreased motor control noted, requiring min A for safety  Standing on firm  surface: Stepping with RLE to stepping stones yellow, pink, green in various order to challenge motor control and coordination x2-3 min, CGA to min A for safety with moderate difficulty reported;  Finished with gait around gym x80 feet with tripod base cane Gait on red mat on floor with tripod base cane x2 laps with min A for safety, slower gait speed with slight unsteadiness noted;     Pt educated throughout session about proper posture and technique with exercises. Improved exercise technique, movement at target joints, use of target muscles after min to mod verbal, visual, tactile cues.   Patient tolerated session well. She does report increased fatigue at end of session. Patient pleased with HEP;                             PT Education - 06/04/21 1348     Education Details exercise technique/positioning;    Person(s) Educated Patient    Methods Explanation;Verbal cues    Comprehension Verbalized understanding;Returned demonstration;Verbal cues required;Need further instruction              PT Short Term Goals - 04/28/21 1152       PT SHORT TERM GOAL #1   Title Patient will be adherent to HEP at least 3x a week to improve functional strength and balance for better safety at  home.    Baseline 9/21: Pt reports compliance with HEP and is confident, 1/3: doing exercise 2x a week    Time 4    Period Weeks    Status Partially Met    Target Date 05/26/21      PT SHORT TERM GOAL #2   Title Patient will be independent in transferring sit<>Stand without pushing on arm rests to improve ability to get up from chair.    Baseline 9/21: pt able to complete STS without use of UEs, 1/3:able to stand but has moderate difficulty requiring increased time;    Time 4    Period Weeks    Status Partially Met    Target Date 05/26/21               PT Long Term Goals - 04/28/21 1153       PT LONG TERM GOAL #1   Title Patient (> 25 years old) will complete five times sit to stand test in < 15 seconds indicating an increased LE strength and improved balance.    Baseline 9/21: 29 seconds hands-free 10/10: deferred 10/12: 34 sec hands free; 11/2: 23.8 sec hands-free; 12/7: 33.2 sec hands free, 1/3: 33.92 hands free    Time 8    Period Weeks    Status On-going    Target Date 06/23/21      PT LONG TERM GOAL #2   Title Patient will increase six minute walk test distance to >400 feet for improve gait ability    Baseline 9/21: 368 ft with RW; 10/10: deferred 10/12: 408 ft; 11/2: 476 ft with 4WW, 1/3: 475 feet with RW    Time 8    Period Weeks    Status Achieved    Target Date 06/23/21      PT LONG TERM GOAL #3   Title Patient will increase 10 meter walk test to >1.61ms as to improve gait speed for better community ambulation and to reduce fall risk.    Baseline 9/21: 0.5 m/s with RW; 10/10 deferred 10/12: 0.93 m/s with 42QJ 11/2: 0.83 m/s with 43HL  12/7: 0.52 m/s with RW, 1/3: 0.704    Time 8    Period Weeks    Status Partially Met    Target Date 06/23/21      PT LONG TERM GOAL #4   Title Patient will be independent with ascend/descend 6 steps using single UE in step over step pattern without LOB.    Baseline 9/21:  Patient uses PT stairs (4 steps) with bilateral upper  extremity support on handrails for both ascending/descending.  Patient exhibits reciprocal pattern ascending and step to pattern descending. 10/10: deferred; 10/12: ascending/descending recip. steps with BUE support; 11/2: Ascended/descended 8 steps total, reciprocal pattern ascending and step-to descending. Pt uses UUE support throughout with CGA assist.; 12/7: Ascended and descended 8 steps total with UUE support and CGA. Reciprocal stepping ascending and step to descending., 1/3: Deferrred    Time 8    Period Weeks    Status Partially Met    Target Date 06/23/21      PT LONG TERM GOAL #5   Title Patient will demonstrate an improved Berg Balance Score of >45/56 as to demonstrate improved balance with ADLs such as sitting/standing and transfer balance and reduced fall risk.    Baseline 9/21: deferred d/t time; 9/29: 42/56; 10/10: deferred; 10/12: 44/56; 11/2: deferred; 11/7: 46/56, 1/3: deferred    Time 8    Period Weeks    Status Achieved    Target Date 06/23/21      PT LONG TERM GOAL #6   Title Patient will improve FOTO score to >60% to indicate improved functional mobility with ADLs.    Baseline 9/21: 59; 10/10: 57; 11/2: 60; 12/7: 58%, 1/3: 61%    Time 8    Period Weeks    Status Achieved    Target Date 06/23/21                   Plan - 06/04/21 1539     Clinical Impression Statement Patient motivated and participated well within session. She was instructed in advanced balance tasks. She continues to have increased right foot drag which limits foot clearance and leads to instability. Instructed patient in LE strengthening with hip flexion against resistance to improve foot clearance. she reports moderate difficulty when stepping in various directions with RLE exhibiting decreased motor control and impaired motor planning. She required CGA to min A throughout session. Patient is able to exhibit better gait safety with tripod base cane at times but still requires min A with some  incoordination especially when initially walking. She would benefit from additional skilled PT Intervention to improve strength, balance and gait safety;    Personal Factors and Comorbidities Age    Comorbidities HTN, CKD, fall risk, type 2 diabetes,    Examination-Activity Limitations Lift;Locomotion Level;Squat;Stairs;Stand;Toileting;Transfers;Bathing    Examination-Participation Restrictions Cleaning;Community Activity;Laundry;Meal Prep;Shop;Volunteer;Driving    Stability/Clinical Decision Making Stable/Uncomplicated    Rehab Potential Poor    PT Frequency 2x / week    PT Duration 8 weeks    PT Treatment/Interventions Cryotherapy;Electrical Stimulation;Moist Heat;Gait training;Stair training;Functional mobility training;Therapeutic activities;Therapeutic exercise;Neuromuscular re-education;Balance training;Patient/family education;Orthotic Fit/Training;Energy conservation    PT Next Visit Plan work on LE strengthening and balance; gait training, endurance, continue POC as previously indicated    PT Home Exercise Plan Access Code: [email or web address]; no updates    Consulted and Agree with Plan of Care Patient             Patient will benefit from skilled therapeutic intervention in order to improve the following  deficits and impairments:  Abnormal gait, Decreased balance, Decreased endurance, Decreased mobility, Difficulty walking, Cardiopulmonary status limiting activity, Decreased activity tolerance, Decreased coordination, Decreased strength  Visit Diagnosis: Muscle weakness (generalized)  Other lack of coordination  Unsteadiness on feet  Other abnormalities of gait and mobility  Difficulty in walking, not elsewhere classified  Abnormality of gait and mobility     Problem List Patient Active Problem List   Diagnosis Date Noted   Carotid stenosis, symptomatic, with infarction ([place]) 11/13/2020   Gout flare 11/10/2020   UTI (urinary tract infection) 11/10/2020   Left carotid  artery stenosis 11/10/2020   Chronic kidney disease 11/10/2020   Left basal ganglia embolic stroke ([place]) 40/98/1191   PAC (premature atrial contraction)    Pre-procedural cardiovascular examination    Ischemic stroke ([place]) 10/20/2020   TIA (transient ischemic attack) 10/19/2020   Right hemiparesis ([place]) 10/19/2020   Type 2 diabetes mellitus with stage 3 chronic kidney disease, without long-term current use of insulin ([place]) 08/24/2018   Hyperlipidemia, unspecified 07/19/2017   Hypertension 07/19/2017   PUD (peptic ulcer disease) 07/19/2017   Type 2 diabetes mellitus ([place]) 07/19/2017   Personal history of gout 08/12/2016   Anemia, unspecified 04/09/2016   Hypothyroidism, acquired 12/10/2015    Luqman Perrelli, PT, DPT 06/04/2021, 3:41 PM  [place] [facility] [place] SERVICES [address] [place], [place], [address] Phone: [phone]   Fax:  [phone]  Name: Cassandra Schaefer MRN: [id number] Date of Birth: 01/19/1929

## 2021-06-09 ENCOUNTER — Other Ambulatory Visit: Payer: Self-pay

## 2021-06-09 ENCOUNTER — Ambulatory Visit: Payer: Medicare HMO | Admitting: Physical Therapy

## 2021-06-09 ENCOUNTER — Ambulatory Visit: Payer: Medicare HMO | Admitting: Occupational Therapy

## 2021-06-09 ENCOUNTER — Encounter: Payer: Medicare HMO | Admitting: Speech Pathology

## 2021-06-09 ENCOUNTER — Encounter: Payer: Self-pay | Admitting: Physical Therapy

## 2021-06-09 DIAGNOSIS — M6281 Muscle weakness (generalized): Secondary | ICD-10-CM | POA: Diagnosis not present

## 2021-06-09 DIAGNOSIS — R269 Unspecified abnormalities of gait and mobility: Secondary | ICD-10-CM

## 2021-06-09 DIAGNOSIS — R278 Other lack of coordination: Secondary | ICD-10-CM

## 2021-06-09 DIAGNOSIS — R2689 Other abnormalities of gait and mobility: Secondary | ICD-10-CM

## 2021-06-09 DIAGNOSIS — R2681 Unsteadiness on feet: Secondary | ICD-10-CM

## 2021-06-09 DIAGNOSIS — R262 Difficulty in walking, not elsewhere classified: Secondary | ICD-10-CM

## 2021-06-09 NOTE — Therapy (Signed)
[place] [facility] SERVICES [address] [place], [place], [address] Phone: [phone]   Fax:  [phone]  Physical Therapy Treatment  Patient Details  Name: Cassandra Schaefer MRN: [id number] Date of Birth: 07/24/1928 Referring Provider (PT): Dr. Dew/Dr. Ginette Pitman PCP   Encounter Date: 06/09/2021   PT End of Session - 06/09/21 1420     Visit Number 45    Number of Visits 4    Date for PT Re-Evaluation 06/23/21    Authorization Type [employer]    Authorization Time Period 02/02/21-04/27/21    Progress Note Due on Visit 29    PT Start Time 1148    PT Stop Time 1230    PT Time Calculation (min) 42 min    Equipment Utilized During Treatment Gait belt    Activity Tolerance Patient tolerated treatment well    Behavior During Therapy WFL for tasks assessed/performed             Past Medical History:  Diagnosis Date   Anemia    Cough    RESOLVING, NO FEVER   Diabetes mellitus without complication ([place])    Gout    Hypertension    Hypothyroidism    PUD (peptic ulcer disease)    Stroke [facility])    Wears dentures    full upper, partial lower    Past Surgical History:  Procedure Laterality Date   AMPUTATION TOE Left 12/14/2017   Procedure: AMPUTATION TOE-4TH MPJ;  Surgeon: Samara Deist, DPM;  Location: [facility];  Service: Podiatry;  Laterality: Left;  IVA LOCAL Diabetic - oral meds   APPENDECTOMY     CATARACT EXTRACTION W/PHACO Right 06/23/2015   Procedure: CATARACT EXTRACTION PHACO AND INTRAOCULAR LENS PLACEMENT (IOC);  Surgeon: Estill Cotta, MD;  Location: [facility];  Service: Ophthalmology;  Laterality: Right;  [place] 01:28 AP% 23.4 CDE 36.14 fluid pack lot # [id number]   CATARACT EXTRACTION W/PHACO Left 07/28/2015   Procedure: CATARACT EXTRACTION PHACO AND INTRAOCULAR LENS PLACEMENT (IOC);  Surgeon: Estill Cotta, MD;  Location: [facility];  Service: Ophthalmology;  Laterality: Left;  [place]    1:29.7 AP%  24.9 CDE    41.32 fluid casette lot #[id number] H exp 09/23/2016   COONOSCOPY     AND ENDOSCOPY   DILATION AND CURETTAGE OF UTERUS     ENDARTERECTOMY Left 11/13/2020   Procedure: Evacuation of left neck hematoma;  Surgeon: Katha Cabal, MD;  Location: [facility];  Service: Vascular;  Laterality: Left;   ENDARTERECTOMY Left 11/13/2020   Procedure: ENDARTERECTOMY CAROTID;  Surgeon: Algernon Huxley, MD;  Location: [facility];  Service: Vascular;  Laterality: Left;   TONSILLECTOMY     TUBAL LIGATION      There were no vitals filed for this visit.   Subjective Assessment - 06/09/21 1151     Subjective Patient reports having a good weekend. She reports doing some dusting. She doesn't feel comfortable vaccuuming due to balance. She was able to play dominos yesterday;    Patient is accompained by: --   Daughter, Cassandra Schaefer   Pertinent History 88 y.o. female with medical history significant for type 2 diabetes mellitus, essential hypertension, acquired hypothyroidism, hyperlipidemia, stage III chronic kidney disease reports increased right sided weakness on 10/19/20. She was diagnosed with left basal ganglia ischemic stroke. She was discharged to inpatient rehab for 10 days and then discharged home. Patient was evaluated by outpatient PT on 11/12/20. CVA was due to carotid stenosis. She underwent left carotid endartectomy on  7/21. Procedure went well however patient suffered from left cervical hematoma and had subsequent surgical procedure to stop the bleed and remove the hematoma on 11/13/20. They were concerned with her breathing and therefore intubated her for 1 day, she was in ICU for 2 days and transferred to med/surge on 11/15/20. Patient was discharged home on 11/17/20 and is now returning to outpatient PT. She has had the stiches removed and is healing well. Denies any neck pain or stiffness. She lives with her daughter and has 24/7 caregiver support. She is still using RW for most ambulation. She is able to transfer to bedside  commode well and is able to stand short time unsupported. She reports feeling very weak and fatigued. She reports she has been dragging her right foot more. She denies any numbness/tingling; She reports feeling like her legs are wanting to do more but is hesitant because of fear of falling. She is mod I for self care morning routine. She does still receive help with showering. She has been working on increasing her activity but denies any formal exercise.    Limitations Standing;Walking    How long can you sit comfortably? NA    How long can you stand comfortably? 30-60 min with support    How long can you walk comfortably? about 100 feet with RW, still limited to short distances;    Diagnostic tests MRI shows left basal ganglia infarct 10/21/20    Patient Stated Goals "Improve balance and walking and not need RW, get back to independent."    Currently in Pain? No/denies                TREATMENT:    INTERVENTIONS - gait belt donned and CGA used with all upright interventions unless otherwise specified    Exercise: Sit<>Stand while holding small ball to challenge LE strengthening x5 reps, CGA for safety and cues for positioning including to keep feet back and feet apart; She does require cues for forward weight shift for better transfer ability;    Standing on bottom step: Hip flexion march against green tband with toe tap to 3rd step with B rail assist x10 reps x2 sets each LE, moderate difficulty reported on RLE;     NMR:   Gait around gym x150 feet with tripod base cane requiring CGA for safety with cues for increased step length and to improve RLE foot clearance; Patient does report increased fatigue with prolonged walking;   Standing on airex pad: -alternate toe taps to 6  inch step (1st step) with 0 rail assist x10 reps each LE; Standing one foot on airex, one foot on 8 inch step: Unsupported standing 15 sec hold x1 reps Unsupported standing: head turns side/side x5 reps each  direction  *Pt required CGA to min A for safety with increased instability standing on RLE;  -modified tandem stance:  Eyes open 20 sec hold, minimal sway, eyes closed 15 sec hold with increased sway noted  Head turns side/side x5 reps, CGA for safety with increased instability when unsupported;    Forward/backward step airex to airex without UE assist x5 reps, min A with cues to increase foot clearance and increase hip flexion to avoid foot dragging on foam pad; Moderate difficulty reported;  Side stepping airex to airex with 1 rail assist x5 reps each direction with CGA for safety with cues for lateral weight shift;   Standing on firm surface, feet in mid stance, forward/backward rock with UE reach x10 reps each foot  in front, CGA to min A and cues for coordination and weight shift;    Finished with gait around gym x150 feet with tripod base cane With added challenge of turning head side/side and calling out cards on wall, required min A with increased instability and shorter step length with added challenge of turning head;     Pt educated throughout session about proper posture and technique with exercises. Improved exercise technique, movement at target joints, use of target muscles after min to mod verbal, visual, tactile cues.   Patient tolerated session well. She does report increased fatigue at end of session. Patient pleased with HEP;                                  PT Education - 06/09/21 1420     Education Details exercise technique/positioning;    Person(s) Educated Patient    Methods Explanation;Verbal cues    Comprehension Verbalized understanding;Returned demonstration;Verbal cues required;Need further instruction              PT Short Term Goals - 04/28/21 1152       PT SHORT TERM GOAL #1   Title Patient will be adherent to HEP at least 3x a week to improve functional strength and balance for better safety at home.    Baseline 9/21: Pt  reports compliance with HEP and is confident, 1/3: doing exercise 2x a week    Time 4    Period Weeks    Status Partially Met    Target Date 05/26/21      PT SHORT TERM GOAL #2   Title Patient will be independent in transferring sit<>Stand without pushing on arm rests to improve ability to get up from chair.    Baseline 9/21: pt able to complete STS without use of UEs, 1/3:able to stand but has moderate difficulty requiring increased time;    Time 4    Period Weeks    Status Partially Met    Target Date 05/26/21               PT Long Term Goals - 04/28/21 1153       PT LONG TERM GOAL #1   Title Patient (> 98 years old) will complete five times sit to stand test in < 15 seconds indicating an increased LE strength and improved balance.    Baseline 9/21: 29 seconds hands-free 10/10: deferred 10/12: 34 sec hands free; 11/2: 23.8 sec hands-free; 12/7: 33.2 sec hands free, 1/3: 33.92 hands free    Time 8    Period Weeks    Status On-going    Target Date 06/23/21      PT LONG TERM GOAL #2   Title Patient will increase six minute walk test distance to >400 feet for improve gait ability    Baseline 9/21: 368 ft with RW; 10/10: deferred 10/12: 408 ft; 11/2: 476 ft with 4WW, 1/3: 475 feet with RW    Time 8    Period Weeks    Status Achieved    Target Date 06/23/21      PT LONG TERM GOAL #3   Title Patient will increase 10 meter walk test to >1.82ms as to improve gait speed for better community ambulation and to reduce fall risk.    Baseline 9/21: 0.5 m/s with RW; 10/10 deferred 10/12: 0.93 m/s with 45TM 11/2: 0.83 m/s with 49PJ 12/7: 0.52 m/s with RW, 1/3:  0.704    Time 8    Period Weeks    Status Partially Met    Target Date 06/23/21      PT LONG TERM GOAL #4   Title Patient will be independent with ascend/descend 6 steps using single UE in step over step pattern without LOB.    Baseline 9/21:  Patient uses PT stairs (4 steps) with bilateral upper extremity support on  handrails for both ascending/descending.  Patient exhibits reciprocal pattern ascending and step to pattern descending. 10/10: deferred; 10/12: ascending/descending recip. steps with BUE support; 11/2: Ascended/descended 8 steps total, reciprocal pattern ascending and step-to descending. Pt uses UUE support throughout with CGA assist.; 12/7: Ascended and descended 8 steps total with UUE support and CGA. Reciprocal stepping ascending and step to descending., 1/3: Deferrred    Time 8    Period Weeks    Status Partially Met    Target Date 06/23/21      PT LONG TERM GOAL #5   Title Patient will demonstrate an improved Berg Balance Score of >45/56 as to demonstrate improved balance with ADLs such as sitting/standing and transfer balance and reduced fall risk.    Baseline 9/21: deferred d/t time; 9/29: 42/56; 10/10: deferred; 10/12: 44/56; 11/2: deferred; 11/7: 46/56, 1/3: deferred    Time 8    Period Weeks    Status Achieved    Target Date 06/23/21      PT LONG TERM GOAL #6   Title Patient will improve FOTO score to >60% to indicate improved functional mobility with ADLs.    Baseline 9/21: 59; 10/10: 57; 11/2: 60; 12/7: 58%, 1/3: 61%    Time 8    Period Weeks    Status Achieved    Target Date 06/23/21                   Plan - 06/09/21 1420     Clinical Impression Statement Patient motivated and participated well within session. Focused on balance and gait safety. Patient continues to have difficulty with narrow base of support especially on compliant surfaces. Progressed balance exercise with dynamic movement. Patient had difficulty stepping from airex to airex pad requiring rail assist. She also required cues for weight shift with forward/backward shift/rock. Patient was able to exhibit better momentum with gait with cane, but does exhibit increased right foot drag with prolonged ambulation. She also exhibits increased instability when adding additional challenge such as turning head  or looking at different objects. She would benefit from additional skilled PT Intervention to improve strength, balance and gait safety;    Personal Factors and Comorbidities Age    Comorbidities HTN, CKD, fall risk, type 2 diabetes,    Examination-Activity Limitations Lift;Locomotion Level;Squat;Stairs;Stand;Toileting;Transfers;Bathing    Examination-Participation Restrictions Cleaning;Community Activity;Laundry;Meal Prep;Shop;Volunteer;Driving    Stability/Clinical Decision Making Stable/Uncomplicated    Rehab Potential Poor    PT Frequency 2x / week    PT Duration 8 weeks    PT Treatment/Interventions Cryotherapy;Electrical Stimulation;Moist Heat;Gait training;Stair training;Functional mobility training;Therapeutic activities;Therapeutic exercise;Neuromuscular re-education;Balance training;Patient/family education;Orthotic Fit/Training;Energy conservation    PT Next Visit Plan work on LE strengthening and balance; gait training, endurance, continue POC as previously indicated    PT Home Exercise Plan Access Code: [email or web address]; no updates    Consulted and Agree with Plan of Care Patient             Patient will benefit from skilled therapeutic intervention in order to improve the following deficits and impairments:  Abnormal gait, Decreased balance, Decreased  endurance, Decreased mobility, Difficulty walking, Cardiopulmonary status limiting activity, Decreased activity tolerance, Decreased coordination, Decreased strength  Visit Diagnosis: Other lack of coordination  Muscle weakness (generalized)  Unsteadiness on feet  Other abnormalities of gait and mobility  Difficulty in walking, not elsewhere classified  Abnormality of gait and mobility     Problem List Patient Active Problem List   Diagnosis Date Noted   Carotid stenosis, symptomatic, with infarction ([place]) 11/13/2020   Gout flare 11/10/2020   UTI (urinary tract infection) 11/10/2020   Left carotid artery stenosis  11/10/2020   Chronic kidney disease 11/10/2020   Left basal ganglia embolic stroke ([place]) 47/82/9562   PAC (premature atrial contraction)    Pre-procedural cardiovascular examination    Ischemic stroke ([place]) 10/20/2020   TIA (transient ischemic attack) 10/19/2020   Right hemiparesis ([place]) 10/19/2020   Type 2 diabetes mellitus with stage 3 chronic kidney disease, without long-term current use of insulin ([place]) 08/24/2018   Hyperlipidemia, unspecified 07/19/2017   Hypertension 07/19/2017   PUD (peptic ulcer disease) 07/19/2017   Type 2 diabetes mellitus ([place]) 07/19/2017   Personal history of gout 08/12/2016   Anemia, unspecified 04/09/2016   Hypothyroidism, acquired 12/10/2015    Brittan Mapel, PT, DPT 06/09/2021, 2:24 PM  [place] MAIN [facility] SERVICES [place], [place], [address] Phone: [phone]   Fax:  [phone]  Name: Adeana Grilliot MRN: [id number] Date of Birth: 12-Oct-1928

## 2021-06-09 NOTE — Therapy (Signed)
[place] MAIN [facility] SERVICES [place], [place], [address] Phone: [phone]   Fax:  [phone]  Occupational Therapy Treatment  Patient Details  Name: Cassandra Schaefer MRN: [id number] Date of Birth: 11-Feb-1929 Referring Provider (OT): Dr. Tracie Harrier   Encounter Date: 06/09/2021   OT End of Session - 06/09/21 1116     Visit Number 79    Number of Visits 38    Date for OT Re-Evaluation 07/14/21    Authorization Time Period Reporting period starting 02/16/2021    OT Start Time 1105    OT Stop Time 1145    OT Time Calculation (min) 40 min    Activity Tolerance Patient tolerated treatment well    Behavior During Therapy WFL for tasks assessed/performed             Past Medical History:  Diagnosis Date   Anemia    Cough    RESOLVING, NO FEVER   Diabetes mellitus without complication ([place])    Gout    Hypertension    Hypothyroidism    PUD (peptic ulcer disease)    Stroke [facility])    Wears dentures    full upper, partial lower    Past Surgical History:  Procedure Laterality Date   AMPUTATION TOE Left 12/14/2017   Procedure: AMPUTATION TOE-4TH MPJ;  Surgeon: Samara Deist, DPM;  Location: [facility];  Service: Podiatry;  Laterality: Left;  IVA LOCAL Diabetic - oral meds   APPENDECTOMY     CATARACT EXTRACTION W/PHACO Right 06/23/2015   Procedure: CATARACT EXTRACTION PHACO AND INTRAOCULAR LENS PLACEMENT (IOC);  Surgeon: Estill Cotta, MD;  Location: [facility];  Service: Ophthalmology;  Laterality: Right;  [place] 01:28 AP% 23.4 CDE 36.14 fluid pack lot # [id number]   CATARACT EXTRACTION W/PHACO Left 07/28/2015   Procedure: CATARACT EXTRACTION PHACO AND INTRAOCULAR LENS PLACEMENT (IOC);  Surgeon: Estill Cotta, MD;  Location: [facility];  Service: Ophthalmology;  Laterality: Left;  [place]    1:29.7 AP%  24.9 CDE   41.32 fluid casette lot [id number] H exp 09/23/2016   COONOSCOPY     AND ENDOSCOPY   DILATION AND  CURETTAGE OF UTERUS     ENDARTERECTOMY Left 11/13/2020   Procedure: Evacuation of left neck hematoma;  Surgeon: Katha Cabal, MD;  Location: [facility];  Service: Vascular;  Laterality: Left;   ENDARTERECTOMY Left 11/13/2020   Procedure: ENDARTERECTOMY CAROTID;  Surgeon: Algernon Huxley, MD;  Location: [facility];  Service: Vascular;  Laterality: Left;   TONSILLECTOMY     TUBAL LIGATION      There were no vitals filed for this visit.   Subjective Assessment - 06/09/21 1116     Subjective  Pt. reports having difficulty fastening her pants onto a pant hanger.    Patient is accompanied by: Family member    Pertinent History R ankle pain, gout, T2DM, HTN, CKDIII; R/L carotid endarectomy    Currently in Pain? 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 Pt. Was able to complete simple design patterns with relatively few cues. Pt. had difficulty with following more complex design patterns, requiring increased time, visual, and verbal cues. Pt. had difficulty with following mirror images of the designs when they were set-up for the patient. Pt. Required extensive cues to complete the design patterns. Pt. Continues to work RUE motor  control, and [facility] skills as well as dual tasking tasks during [facility] in order to work towards improving with UE functioning during ADLs, and IADL tasks.                      OT Education - 06/09/21 1116     Education Details RUE functioning.    Person(s) Educated Patient    Methods Explanation;Verbal cues;Demonstration;Tactile cues    Comprehension Verbalized understanding;Verbal cues required;Returned demonstration              OT Short Term Goals - 03/23/21 1657       OT SHORT TERM GOAL #1   Title Pt will perform HEP for RUE strength/coordination independently.    Baseline Eval: HEP not yet established in outpatient, but  pt is working with putty from [name]; 12/04/2020; [facility] HEP initiated this day with mod vc after return demo; 01/05/2021: indep with currently program, but ongoing as pt progresses; 01/28/2021: vc to revisit and focus on grip and pinch strengthening with putty; 03/23/21: pt is indep with HEP    Time 6    Period Weeks    Status Achieved    Target Date 03/23/21               OT Long Term Goals - 04/30/21 1126       OT LONG TERM GOAL #1   Title Pt will improve hand writing to 100% legibility with R hand to be able to independently write a check.    Baseline Eval: signature is 75% legible with R hand, requiring extra time; 12/04/2020: no chan to complete.ge from eval; 01/05/2021: Printing is 100% legible, signature is 90% legible, but still requires extra time and effort.; 01/26/21: Signature is 90% legible and speed/fluidity have improved. 20th visit: 90% legible, 03/23/21: 90% legible, extra time 40th visit 90 % legibility with increased time    Time 12    Period Weeks    Status On-going    Target Date 07/14/21      OT LONG TERM GOAL #2   Title Pt will improve R hand coordination to enable good manipulation of iphone using R dominant hand    Baseline Eval: Pt uses L hand d/t R hand lacking sufficient [place] to press buttons on phone; 12/04/2020: no change from eval; 01/05/2021: R hand coordination is improving but pt still uses L hand to dial phone; 01/28/21: Pt using R dominant hand to press buttons and apps on phone 50% of the time.20th visit: Right hand [facility] skilols are improving, Pt. is improving with manipulation of the iphone; 03/23/21: Pt now consistently using R hand to press buttons on phone with good accuracy.40th visit: Pt. continues to have difficulty modifying the accurate amount of pressure needed to wipe the phone.    Time 12    Period Weeks    Status On-going    Target Date 07/14/21      OT LONG TERM GOAL #3   Title Pt will improve GMC throughout RUE to enable pt to reach for and pick up  ADL supplies with R dominant hand without dropping or knocking over objects.    Baseline Eval: Pt reports RUE is clumsy and easily knocks over objects when trying to reach for ADL supplies.  Pt verbalizes that her "aim is off."; 12/04/2020: pt reports slight improvement since eval and has started using her R hand to eat, but still feels clumsy; 01/05/2021: Greatly improved; pt is using silverware to eat with  R hand, can comb hair with RUE, still mildly ataxic; 01/26/21: pt reports difficulty picking up objects within a small space (ie; may knock the toothpaste holder down when reaching for toothbrush), but reaching for light/larger objects is improving 20th visit: pt. continues to be mildly ataxic, however is consistently improving with motor control, and [place] while reaching targets; 03/23/21: Pt reaches with accuracy towards targets if she takes her time (pt drops and knocks things over when moving at a faster/normal pace) 04/23/2021: Pt. continues to present with difficulty with depth perception, and impaired [place].40th visit: Pt. is improving, however tends to knock over lighter weight objects.    Time 12    Period Weeks    Status On-going    Target Date 07/14/21      OT LONG TERM GOAL #4   Title Pt will improve FOTO score to 68 or better to indicate measurable functional improvement.    Baseline Eval: FOTO 55; 01/05/2021: FOTO 61; 01/28/21: FOTO 61, 20th visit: 61; 03/23/21: FOTO 60 4oth visit: FOTO score: 61    Time 12    Period Weeks    Status On-going    Target Date 07/14/21      OT LONG TERM GOAL #5   Title Pt will perform dynamic standing tasks with modified indep, AD as needed in order to reduce fall risk with ADLs                   Plan - 06/09/21 1117     Clinical Impression Statement Pt. worked on Film/video editor. Pt. worked on Retail banker, and positioning them onto a design form following design patterns. Pt. Was able to complete simple design patterns with  relatively few cues. Pt. had difficulty with following more complex design patterns, requiring increased time, visual, and verbal cues. Pt. had difficulty with following mirror images of the designs when they were set-up for the patient. Pt. Required extensive cues to complete the design patterns. 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    OT Occupational Profile and History Detailed Assessment- Review of Records and additional review of physical, cognitive, psychosocial history related to current functional performance    Occupational performance deficits (Please refer to evaluation for details): ADL's;Leisure;IADL's    Body Structure / Function / Physical Skills ADL;Coordination;Endurance;GMC;UE functional use;Balance;Sensation;Body mechanics;IADL;Pain;Dexterity;FMC;Strength;Gait;Mobility    Rehab Potential Good    Clinical Decision Making Several treatment options, min-mod task modification necessary    Comorbidities Affecting Occupational Performance: May have comorbidities impacting occupational performance    Modification or Assistance to Complete Evaluation  Min-Moderate modification of tasks or assist with assess necessary to complete eval    OT Frequency 2x / week    OT Duration 12 weeks    OT Treatment/Interventions Self-care/ADL training;Therapeutic exercise;DME and/or AE instruction;Functional Mobility Training;Balance training;Neuromuscular education;Therapeutic activities;Patient/family education    Consulted and Agree with Plan of Care Patient             Patient will benefit from skilled therapeutic intervention in order to improve the following deficits and impairments:   Body Structure / Function / Physical Skills: ADL, Coordination, Endurance, GMC, UE functional use, Balance, Sensation, Body mechanics, IADL, Pain, Dexterity, FMC,  Strength, Gait, Mobility       Visit Diagnosis: Other lack of coordination    Problem List Patient Active Problem List  Diagnosis Date Noted   Carotid stenosis, symptomatic, with infarction ([place]) 11/13/2020   Gout flare 11/10/2020   UTI (urinary tract infection) 11/10/2020   Left carotid artery stenosis 11/10/2020   Chronic kidney disease 11/10/2020   Left basal ganglia embolic stroke ([place]) [id number]   PAC (premature atrial contraction)    Pre-procedural cardiovascular examination    Ischemic stroke ([place]) 10/20/2020   TIA (transient ischemic attack) 10/19/2020   Right hemiparesis ([place]) 10/19/2020   Type 2 diabetes mellitus with stage 3 chronic kidney disease, without long-term current use of insulin ([place]) 08/24/2018   Hyperlipidemia, unspecified 07/19/2017   Hypertension 07/19/2017   PUD (peptic ulcer disease) 07/19/2017   Type 2 diabetes mellitus ([place]) 07/19/2017   Personal history of gout 08/12/2016   Anemia, unspecified 04/09/2016   Hypothyroidism, acquired 12/10/2015   Harrel Carina, MS, OTR/L   Harrel Carina, OT 06/09/2021, 11:25 AM  [place] MAIN [facility] SERVICES [address] [place], [place], [address] Phone: [phone]   Fax:  [phone]  Name: Don Ruschak MRN: [id number] Date of Birth: [birth year]

## 2021-06-11 ENCOUNTER — Ambulatory Visit: Payer: Medicare HMO | Admitting: Occupational Therapy

## 2021-06-11 ENCOUNTER — Ambulatory Visit: Payer: Medicare HMO | Admitting: Physical Therapy

## 2021-06-11 ENCOUNTER — Other Ambulatory Visit: Payer: Self-pay

## 2021-06-11 ENCOUNTER — Encounter: Payer: Medicare HMO | Admitting: Speech Pathology

## 2021-06-11 ENCOUNTER — Encounter: Payer: Self-pay | Admitting: Occupational Therapy

## 2021-06-11 DIAGNOSIS — M6281 Muscle weakness (generalized): Secondary | ICD-10-CM

## 2021-06-11 DIAGNOSIS — R278 Other lack of coordination: Secondary | ICD-10-CM

## 2021-06-11 DIAGNOSIS — R262 Difficulty in walking, not elsewhere classified: Secondary | ICD-10-CM

## 2021-06-11 DIAGNOSIS — R2689 Other abnormalities of gait and mobility: Secondary | ICD-10-CM

## 2021-06-11 DIAGNOSIS — R2681 Unsteadiness on feet: Secondary | ICD-10-CM

## 2021-06-11 NOTE — Therapy (Signed)
[place] [facility] SERVICES [address] [place], [place], [address] Phone: [phone]   Fax:  [phone]  Physical Therapy Treatment  Patient Details  Name: Cassandra Schaefer MRN: [id number] Date of Birth: 1928-09-02 Referring Provider (PT): Dr. Dew/Dr. Ginette Pitman PCP   Encounter Date: 06/11/2021   PT End of Session - 06/11/21 1313     Visit Number 19    Number of Visits 63    Date for PT Re-Evaluation 06/23/21    Authorization Type [employer]    Authorization Time Period 02/02/21-04/27/21    Progress Note Due on Visit 14    PT Start Time 1148    PT Stop Time 1230    PT Time Calculation (min) 42 min    Equipment Utilized During Treatment Gait belt    Activity Tolerance Patient tolerated treatment well    Behavior During Therapy WFL for tasks assessed/performed             Past Medical History:  Diagnosis Date   Anemia    Cough    RESOLVING, NO FEVER   Diabetes mellitus without complication ([place])    Gout    Hypertension    Hypothyroidism    PUD (peptic ulcer disease)    Stroke [facility])    Wears dentures    full upper, partial lower    Past Surgical History:  Procedure Laterality Date   AMPUTATION TOE Left 12/14/2017   Procedure: AMPUTATION TOE-4TH MPJ;  Surgeon: Samara Deist, DPM;  Location: [place];  Service: Podiatry;  Laterality: Left;  IVA LOCAL Diabetic - oral meds   APPENDECTOMY     CATARACT EXTRACTION W/PHACO Right 06/23/2015   Procedure: CATARACT EXTRACTION PHACO AND INTRAOCULAR LENS PLACEMENT (IOC);  Surgeon: Estill Cotta, MD;  Location: [facility];  Service: Ophthalmology;  Laterality: Right;  [place] 01:28 AP% 23.4 CDE 36.14 fluid pack lot # [id number]   CATARACT EXTRACTION W/PHACO Left 07/28/2015   Procedure: CATARACT EXTRACTION PHACO AND INTRAOCULAR LENS PLACEMENT (IOC);  Surgeon: Estill Cotta, MD;  Location: [facility];  Service: Ophthalmology;  Laterality: Left;  [place]    1:29.7 AP%  24.9 CDE    41.32 fluid casette lot #[id number] H exp 09/23/2016   COONOSCOPY     AND ENDOSCOPY   DILATION AND CURETTAGE OF UTERUS     ENDARTERECTOMY Left 11/13/2020   Procedure: Evacuation of left neck hematoma;  Surgeon: Katha Cabal, MD;  Location: [facility];  Service: Vascular;  Laterality: Left;   ENDARTERECTOMY Left 11/13/2020   Procedure: ENDARTERECTOMY CAROTID;  Surgeon: Algernon Huxley, MD;  Location: [facility];  Service: Vascular;  Laterality: Left;   TONSILLECTOMY     TUBAL LIGATION      There were no vitals filed for this visit.   Subjective Assessment - 06/11/21 1312     Subjective Patient reports doing well. No pain reported. No new falls;    Patient is accompained by: --   Daughter, [name]   Pertinent History 88 y.o. female with medical history significant for type 2 diabetes mellitus, essential hypertension, acquired hypothyroidism, hyperlipidemia, stage III chronic kidney disease reports increased right sided weakness on 10/19/20. She was diagnosed with left basal ganglia ischemic stroke. She was discharged to inpatient rehab for 10 days and then discharged home. Patient was evaluated by outpatient PT on 11/12/20. CVA was due to carotid stenosis. She underwent left carotid endartectomy on 7/21. Procedure went well however patient suffered from left cervical hematoma and had subsequent surgical procedure  to stop the bleed and remove the hematoma on 11/13/20. They were concerned with her breathing and therefore intubated her for 1 day, she was in ICU for 2 days and transferred to med/surge on 11/15/20. Patient was discharged home on 11/17/20 and is now returning to outpatient PT. She has had the stiches removed and is healing well. Denies any neck pain or stiffness. She lives with her daughter and has 24/7 caregiver support. She is still using RW for most ambulation. She is able to transfer to bedside commode well and is able to stand short time unsupported. She reports feeling very weak and fatigued. She  reports she has been dragging her right foot more. She denies any numbness/tingling; She reports feeling like her legs are wanting to do more but is hesitant because of fear of falling. She is mod I for self care morning routine. She does still receive help with showering. She has been working on increasing her activity but denies any formal exercise.    Limitations Standing;Walking    How long can you sit comfortably? NA    How long can you stand comfortably? 30-60 min with support    How long can you walk comfortably? about 100 feet with RW, still limited to short distances;    Diagnostic tests MRI shows left basal ganglia infarct 10/21/20    Patient Stated Goals "Improve balance and walking and not need RW, get back to independent."    Currently in Pain? No/denies                     TREATMENT:    INTERVENTIONS - gait belt donned and CGA used with all upright interventions unless otherwise specified     NMR:   Gait around gym x40 feet with tripod base cane requiring CGA for safety with cues for increased step length and to improve RLE foot clearance;   Standing in parallel bars: Airex beam: -forward tandem walking x4 laps with intermittent rail assist -side stepping x3 laps each direction with intermittent rail assist -tandem stance, unsupported 10 sec hold x2 reps each foot in front Progressed to tandem stance unsupported with head turns side to side without rail assist x2 reps each foot in front with min A and increased lateral instability noted; -Standing feet apart, tapping balloon each UE, required min-mod A for balance with initially having trouble with balance but with increased repetition was able to exhibit better stance control requiring less assistance;   Forward/backward step airex to airex over orange hurdle with 2-1 rail assist x10 reps, min A with cues to increase foot clearance and increase hip flexion to avoid foot dragging on foam pad; Moderate difficulty  reported; also required cues to increase push off front leg for better weight shift posteriorly when stepping backwards;  Side stepping airex to airex over orange hurdle with 1 rail assist x8 reps each direction with CGA for safety with cues for lateral weight shift;    Seated heel/toe raise x15 reps to increase ankle ROM and reduce stiffness;    Finished with gait around gym x200 feet with tripod base cane With added challenge of turning head side/side and calling out cards on wall, required min A with increased instability and shorter step length with added challenge of turning head;  Backward walking x40 feet with tripod base cane x1 set;     Pt educated throughout session about proper posture and technique with exercises. Improved exercise technique, movement at target joints, use of  target muscles after min to mod verbal, visual, tactile cues.   Patient tolerated session well. She does report increased fatigue at end of session. Patient pleased with HEP;                             PT Education - 06/11/21 1313     Education Details exercise technique/positioning;    Person(s) Educated Patient    Methods Explanation;Verbal cues    Comprehension Verbalized understanding;Returned demonstration;Verbal cues required;Need further instruction              PT Short Term Goals - 04/28/21 1152       PT SHORT TERM GOAL #1   Title Patient will be adherent to HEP at least 3x a week to improve functional strength and balance for better safety at home.    Baseline 9/21: Pt reports compliance with HEP and is confident, 1/3: doing exercise 2x a week    Time 4    Period Weeks    Status Partially Met    Target Date 05/26/21      PT SHORT TERM GOAL #2   Title Patient will be independent in transferring sit<>Stand without pushing on arm rests to improve ability to get up from chair.    Baseline 9/21: pt able to complete STS without use of UEs, 1/3:able to stand but has  moderate difficulty requiring increased time;    Time 4    Period Weeks    Status Partially Met    Target Date 05/26/21               PT Long Term Goals - 04/28/21 1153       PT LONG TERM GOAL #1   Title Patient (> 72 years old) will complete five times sit to stand test in < 15 seconds indicating an increased LE strength and improved balance.    Baseline 9/21: 29 seconds hands-free 10/10: deferred 10/12: 34 sec hands free; 11/2: 23.8 sec hands-free; 12/7: 33.2 sec hands free, 1/3: 33.92 hands free    Time 8    Period Weeks    Status On-going    Target Date 06/23/21      PT LONG TERM GOAL #2   Title Patient will increase six minute walk test distance to >400 feet for improve gait ability    Baseline 9/21: 368 ft with RW; 10/10: deferred 10/12: 408 ft; 11/2: 476 ft with 4WW, 1/3: 475 feet with RW    Time 8    Period Weeks    Status Achieved    Target Date 06/23/21      PT LONG TERM GOAL #3   Title Patient will increase 10 meter walk test to >1.6ms as to improve gait speed for better community ambulation and to reduce fall risk.    Baseline 9/21: 0.5 m/s with RW; 10/10 deferred 10/12: 0.93 m/s with 44MG 11/2: 0.83 m/s with 45OI 12/7: 0.52 m/s with RW, 1/3: 0.704    Time 8    Period Weeks    Status Partially Met    Target Date 06/23/21      PT LONG TERM GOAL #4   Title Patient will be independent with ascend/descend 6 steps using single UE in step over step pattern without LOB.    Baseline 9/21:  Patient uses PT stairs (4 steps) with bilateral upper extremity support on handrails for both ascending/descending.  Patient exhibits reciprocal pattern ascending and step to  pattern descending. 10/10: deferred; 10/12: ascending/descending recip. steps with BUE support; 11/2: Ascended/descended 8 steps total, reciprocal pattern ascending and step-to descending. Pt uses UUE support throughout with CGA assist.; 12/7: Ascended and descended 8 steps total with UUE support and CGA.  Reciprocal stepping ascending and step to descending., 1/3: Deferrred    Time 8    Period Weeks    Status Partially Met    Target Date 06/23/21      PT LONG TERM GOAL #5   Title Patient will demonstrate an improved Berg Balance Score of >45/56 as to demonstrate improved balance with ADLs such as sitting/standing and transfer balance and reduced fall risk.    Baseline 9/21: deferred d/t time; 9/29: 42/56; 10/10: deferred; 10/12: 44/56; 11/2: deferred; 11/7: 46/56, 1/3: deferred    Time 8    Period Weeks    Status Achieved    Target Date 06/23/21      PT LONG TERM GOAL #6   Title Patient will improve FOTO score to >60% to indicate improved functional mobility with ADLs.    Baseline 9/21: 59; 10/10: 57; 11/2: 60; 12/7: 58%, 1/3: 61%    Time 8    Period Weeks    Status Achieved    Target Date 06/23/21                   Plan - 06/11/21 1313     Clinical Impression Statement Patient motivated and participated well within session. She was instructed in advanced balance tasks. Pt does have difficulty with standing on compliant surfaces with increased instability with narrow base of support. She did require CGA to min A throughout session with increased assistance when tapping balloon on airex beam. Patient instructed in advanced gait tasks including backward walking and forward walking with head turns. She was able to exhibit better reciprocal gait pattern this session but does have difficulty clearing right foot with added tasks/distraction. She would benefit from additional skilled PT Intervention to improve strength, balance and gait safety;    Personal Factors and Comorbidities Age    Comorbidities HTN, CKD, fall risk, type 2 diabetes,    Examination-Activity Limitations Lift;Locomotion Level;Squat;Stairs;Stand;Toileting;Transfers;Bathing    Examination-Participation Restrictions Cleaning;Community Activity;Laundry;Meal Prep;Shop;Volunteer;Driving    Stability/Clinical Decision  Making Stable/Uncomplicated    Rehab Potential Poor    PT Frequency 2x / week    PT Duration 8 weeks    PT Treatment/Interventions Cryotherapy;Electrical Stimulation;Moist Heat;Gait training;Stair training;Functional mobility training;Therapeutic activities;Therapeutic exercise;Neuromuscular re-education;Balance training;Patient/family education;Orthotic Fit/Training;Energy conservation    PT Next Visit Plan work on LE strengthening and balance; gait training, endurance, continue POC as previously indicated    PT Home Exercise Plan Access Code: [email or web address]; no updates    Consulted and Agree with Plan of Care Patient             Patient will benefit from skilled therapeutic intervention in order to improve the following deficits and impairments:  Abnormal gait, Decreased balance, Decreased endurance, Decreased mobility, Difficulty walking, Cardiopulmonary status limiting activity, Decreased activity tolerance, Decreased coordination, Decreased strength  Visit Diagnosis: Muscle weakness (generalized)  Unsteadiness on feet  Other lack of coordination  Other abnormalities of gait and mobility  Difficulty in walking, not elsewhere classified     Problem List Patient Active Problem List   Diagnosis Date Noted   Carotid stenosis, symptomatic, with infarction ([place]) 11/13/2020   Gout flare 11/10/2020   UTI (urinary tract infection) 11/10/2020   Left carotid artery stenosis 11/10/2020   Chronic kidney disease 11/10/2020  Left basal ganglia embolic stroke ([place]) 40/76/8088   PAC (premature atrial contraction)    Pre-procedural cardiovascular examination    Ischemic stroke ([place]) 10/20/2020   TIA (transient ischemic attack) 10/19/2020   Right hemiparesis ([place]) 10/19/2020   Type 2 diabetes mellitus with stage 3 chronic kidney disease, without long-term current use of insulin ([place]) 08/24/2018   Hyperlipidemia, unspecified 07/19/2017   Hypertension 07/19/2017   PUD (peptic ulcer disease)  07/19/2017   Type 2 diabetes mellitus ([place]) 07/19/2017   Personal history of gout 08/12/2016   Anemia, unspecified 04/09/2016   Hypothyroidism, acquired 12/10/2015    Ladainian Therien, PT, DPT 06/11/2021, 1:19 PM  [place], [place], [address] Phone: [phone]   Fax:  [phone]  Name: Cassandra Schaefer MRN: [id number] Date of Birth: [birth year]

## 2021-06-11 NOTE — Therapy (Signed)
[place] MA[facility] SERVICES [address] [place], [place], [address] Phone: [phone]   Fax:  [phone]  Occupational Therapy Treatment  Patient Details  Name: Cassandra Schaefer MRN: [id number] Date of Birth: December 25, 1928 Referring Provider (OT): Dr. Tracie Harrier   Encounter Date: 06/11/2021   OT End of Session - 06/11/21 1111     Visit Number 55    Number of Visits 71    Date for OT Re-Evaluation 07/14/21    Authorization Time Period Reporting period starting 02/16/2021    OT Start Time 1104    OT Stop Time 1145    OT Time Calculation (min) 41 min    Activity Tolerance Patient tolerated treatment well    Behavior During Therapy WFL for tasks assessed/performed             Past Medical History:  Diagnosis Date   Anemia    Cough    RESOLVING, NO FEVER   Diabetes mellitus without complication ([place])    Gout    Hypertension    Hypothyroidism    PUD (peptic ulcer disease)    Stroke [facility])    Wears dentures    full upper, partial lower    Past Surgical History:  Procedure Laterality Date   AMPUTATION TOE Left 12/14/2017   Procedure: AMPUTATION TOE-4TH MPJ;  Surgeon: Samara Deist, DPM;  Location: [place];  Service: Podiatry;  Laterality: Left;  IVA LOCAL Diabetic - oral meds   APPENDECTOMY     CATARACT EXTRACTION W/PHACO Right 06/23/2015   Procedure: CATARACT EXTRACTION PHACO AND INTRAOCULAR LENS PLACEMENT (IOC);  Surgeon: Estill Cotta, MD;  Location: [facility];  Service: Ophthalmology;  Laterality: Right;  [place] 01:28 AP% 23.4 CDE 36.14 fluid pack lot # [id number]   CATARACT EXTRACTION W/PHACO Left 07/28/2015   Procedure: CATARACT EXTRACTION PHACO AND INTRAOCULAR LENS PLACEMENT (IOC);  Surgeon: Estill Cotta, MD;  Location: [facility];  Service: Ophthalmology;  Laterality: Left;  [place]    1:29.7 AP%  24.9 CDE   41.32 fluid casette lot [id number] H exp 09/23/2016   COONOSCOPY     AND ENDOSCOPY   DILATION AND  CURETTAGE OF UTERUS     ENDARTERECTOMY Left 11/13/2020   Procedure: Evacuation of left neck hematoma;  Surgeon: Katha Cabal, MD;  Location: [facility];  Service: Vascular;  Laterality: Left;   ENDARTERECTOMY Left 11/13/2020   Procedure: ENDARTERECTOMY CAROTID;  Surgeon: Algernon Huxley, MD;  Location: [facility];  Service: Vascular;  Laterality: Left;   TONSILLECTOMY     TUBAL LIGATION      There were no vitals filed for this visit.   Subjective Assessment - 06/11/21 1110     Subjective  Pt. reports having difficulty fastening her pants onto a pant hanger.    Patient is accompanied by: Family member    Pertinent History R ankle pain, gout, T2DM, HTN, CKDIII; R/L carotid endarectomy    Currently in Pain? No/denies            Pt. worked on right hand [facility] skills manipulating long nosed tweezers to grasp 1/8" small pegs, and placing them onto a pegboard placed on a nonskid surface. Pt. worked on using the tweezers to move the pegs from vertical, and horizontal to a vertical position on the pegboard. Pt. required restbreaks, with proprioception through the right hand. Pt. worked on removing the pegs by grasping them, and storing them in the palm of her right hand.   Pt. is making progress,  and is now able to isolate each finger one at a time, and performing thumb opposition. Pt. is improving with the movement, and precision of the right hand. Pt. Is able to store multiple small objects securely in the palm of her right hand. Pt. Was able to manipulate the tweezers in grasping the small pegs, however had more difficulty with grasping the horizontal pegs. Pt. Continues to work on [place] right hand functioning in order to work towards improving, and maximizing independence with ADLs, and IADL tasks.                       OT Education - 06/11/21 1111     Education Details RUE functioning.    Person(s) Educated Patient    Methods Explanation;Verbal cues;Demonstration;Tactile  cues    Comprehension Verbalized understanding;Verbal cues required;Returned demonstration              OT Short Term Goals - 03/23/21 1657       OT SHORT TERM GOAL #1   Title Pt will perform HEP for RUE strength/coordination independently.    Baseline Eval: HEP not yet established in outpatient, but pt is working with putty from [name]; 12/04/2020; [facility] HEP initiated this day with mod vc after return demo; 01/05/2021: indep with currently program, but ongoing as pt progresses; 01/28/2021: vc to revisit and focus on grip and pinch strengthening with putty; 03/23/21: pt is indep with HEP    Time 6    Period Weeks    Status Achieved    Target Date 03/23/21               OT Long Term Goals - 04/30/21 1126       OT LONG TERM GOAL #1   Title Pt will improve hand writing to 100% legibility with R hand to be able to independently write a check.    Baseline Eval: signature is 75% legible with R hand, requiring extra time; 12/04/2020: no chan to complete.ge from eval; 01/05/2021: Printing is 100% legible, signature is 90% legible, but still requires extra time and effort.; 01/26/21: Signature is 90% legible and speed/fluidity have improved. 20th visit: 90% legible, 03/23/21: 90% legible, extra time 40th visit 90 % legibility with increased time    Time 12    Period Weeks    Status On-going    Target Date 07/14/21      OT LONG TERM GOAL #2   Title Pt will improve R hand coordination to enable good manipulation of iphone using R dominant hand    Baseline Eval: Pt uses L hand d/t R hand lacking sufficient [place] to press buttons on phone; 12/04/2020: no change from eval; 01/05/2021: R hand coordination is improving but pt still uses L hand to dial phone; 01/28/21: Pt using R dominant hand to press buttons and apps on phone 50% of the time.20th visit: Right hand [facility] skilols are improving, Pt. is improving with manipulation of the iphone; 03/23/21: Pt now consistently using R hand to press buttons on phone  with good accuracy.40th visit: Pt. continues to have difficulty modifying the accurate amount of pressure needed to wipe the phone.    Time 12    Period Weeks    Status On-going    Target Date 07/14/21      OT LONG TERM GOAL #3   Title Pt will improve GMC throughout RUE to enable pt to reach for and pick up ADL supplies with R dominant hand without dropping or  knocking over objects.    Baseline Eval: Pt reports RUE is clumsy and easily knocks over objects when trying to reach for ADL supplies.  Pt verbalizes that her "aim is off."; 12/04/2020: pt reports slight improvement since eval and has started using her R hand to eat, but still feels clumsy; 01/05/2021: Greatly improved; pt is using silverware to eat with R hand, can comb hair with RUE, still mildly ataxic; 01/26/21: pt reports difficulty picking up objects within a small space (ie; may knock the toothpaste holder down when reaching for toothbrush), but reaching for light/larger objects is improving 20th visit: pt. continues to be mildly ataxic, however is consistently improving with motor control, and [place] while reaching targets; 03/23/21: Pt reaches with accuracy towards targets if she takes her time (pt drops and knocks things over when moving at a faster/normal pace) 04/23/2021: Pt. continues to present with difficulty with depth perception, and impaired [place].40th visit: Pt. is improving, however tends to knock over lighter weight objects.    Time 12    Period Weeks    Status On-going    Target Date 07/14/21      OT LONG TERM GOAL #4   Title Pt will improve FOTO score to 68 or better to indicate measurable functional improvement.    Baseline Eval: FOTO 55; 01/05/2021: FOTO 61; 01/28/21: FOTO 61, 20th visit: 61; 03/23/21: FOTO 60 4oth visit: FOTO score: 61    Time 12    Period Weeks    Status On-going    Target Date 07/14/21      OT LONG TERM GOAL #5   Title Pt will perform dynamic standing tasks with modified indep, AD as needed in order to  reduce fall risk with ADLs                   Plan - 06/11/21 1113     Clinical Impression Statement Pt. is making progress, and is now able to isolate each finger one at a time, and performing thumb opposition. Pt. is improving with the movement, and precision of the right hand. Pt. Is able to store multiple small objects securely in the palm of her right hand. Pt. Was able to manipulate the tweezers in grasping the small pegs, however had more difficulty with grasping the horizontal pegs. Pt. Continues to work on [place] right hand functioning in order to work towards improving, and maximizing independence with ADLs, and IADL tasks.     OT Occupational Profile and History Detailed Assessment- Review of Records and additional review of physical, cognitive, psychosocial history related to current functional performance    Occupational performance deficits (Please refer to evaluation for details): ADL's;Leisure;IADL's    Body Structure / Function / Physical Skills ADL;Coordination;Endurance;GMC;UE functional use;Balance;Sensation;Body mechanics;IADL;Pain;Dexterity;FMC;Strength;Gait;Mobility    Rehab Potential Good    Clinical Decision Making Several treatment options, min-mod task modification necessary    Comorbidities Affecting Occupational Performance: May have comorbidities impacting occupational performance    Modification or Assistance to Complete Evaluation  Min-Moderate modification of tasks or assist with assess necessary to complete eval    OT Frequency 2x / week    OT Duration 12 weeks    OT Treatment/Interventions Self-care/ADL training;Therapeutic exercise;DME and/or AE instruction;Functional Mobility Training;Balance training;Neuromuscular education;Therapeutic activities;Patient/family education    Consulted and Agree with Plan of Care Patient             Patient will benefit from skilled therapeutic intervention in order to improve the following deficits and  impairments:  Body Structure / Function / Physical Skills: ADL, Coordination, Endurance, GMC, UE functional use, Balance, Sensation, Body mechanics, IADL, Pain, Dexterity, FMC, Strength, Gait, Mobility       Visit Diagnosis: Muscle weakness (generalized)  Other lack of coordination    Problem List Patient Active Problem List   Diagnosis Date Noted   Carotid stenosis, symptomatic, with infarction ([place]) 11/13/2020   Gout flare 11/10/2020   UTI (urinary tract infection) 11/10/2020   Left carotid artery stenosis 11/10/2020   Chronic kidney disease 11/10/2020   Left basal ganglia embolic stroke ([place]) 123456   PAC (premature atrial contraction)    Pre-procedural cardiovascular examination    Ischemic stroke ([place]) 10/20/2020   TIA (transient ischemic attack) 10/19/2020   Right hemiparesis ([place]) 10/19/2020   Type 2 diabetes mellitus with stage 3 chronic kidney disease, without long-term current use of insulin ([place]) 08/24/2018   Hyperlipidemia, unspecified 07/19/2017   Hypertension 07/19/2017   PUD (peptic ulcer disease) 07/19/2017   Type 2 diabetes mellitus ([place]) 07/19/2017   Personal history of gout 08/12/2016   Anemia, unspecified 04/09/2016   Hypothyroidism, acquired 12/10/2015   Harrel Carina, MS, OTR/L   Harrel Carina, OT 06/11/2021, 11:16 AM  [place] [address] [place], [place], [address] Phone: [phone]   Fax:  [phone]  Name: Kenzie Eitzen MRN: [id number] Date of Birth: [birth year]

## 2021-06-16 ENCOUNTER — Other Ambulatory Visit: Payer: Self-pay

## 2021-06-16 ENCOUNTER — Encounter: Payer: Self-pay | Admitting: Physical Therapy

## 2021-06-16 ENCOUNTER — Encounter: Payer: Medicare HMO | Admitting: Speech Pathology

## 2021-06-16 ENCOUNTER — Ambulatory Visit: Payer: Medicare HMO | Admitting: Physical Therapy

## 2021-06-16 ENCOUNTER — Ambulatory Visit: Payer: Medicare HMO | Admitting: Occupational Therapy

## 2021-06-16 ENCOUNTER — Encounter: Payer: Self-pay | Admitting: Occupational Therapy

## 2021-06-16 DIAGNOSIS — R262 Difficulty in walking, not elsewhere classified: Secondary | ICD-10-CM

## 2021-06-16 DIAGNOSIS — R269 Unspecified abnormalities of gait and mobility: Secondary | ICD-10-CM

## 2021-06-16 DIAGNOSIS — R278 Other lack of coordination: Secondary | ICD-10-CM

## 2021-06-16 DIAGNOSIS — R2689 Other abnormalities of gait and mobility: Secondary | ICD-10-CM

## 2021-06-16 DIAGNOSIS — M6281 Muscle weakness (generalized): Secondary | ICD-10-CM

## 2021-06-16 DIAGNOSIS — R2681 Unsteadiness on feet: Secondary | ICD-10-CM

## 2021-06-16 NOTE — Therapy (Signed)
[place] MAIN [facility] SERVICES [address] [place], [place], [address] Phone: [phone]   Fax:  [phone]  Occupational Therapy Treatment  Patient Details  Name: Cassandra Schaefer MRN: [id number] Date of Birth: [birth year] Referring Provider (OT): Dr. Tracie Harrier   Encounter Date: 06/16/2021   OT End of Session - 06/16/21 1108     Visit Number 71    Number of Visits 87    Date for OT Re-Evaluation 07/14/21    Authorization Time Period Reporting period starting 02/16/2021    OT Start Time 1104    OT Stop Time 1145    OT Time Calculation (min) 41 min    Activity Tolerance Patient tolerated treatment well    Behavior During Therapy WFL for tasks assessed/performed             Past Medical History:  Diagnosis Date   Anemia    Cough    RESOLVING, NO FEVER   Diabetes mellitus without complication ([place])    Gout    Hypertension    Hypothyroidism    PUD (peptic ulcer disease)    Stroke [facility])    Wears dentures    full upper, partial lower    Past Surgical History:  Procedure Laterality Date   AMPUTATION TOE Left 12/14/2017   Procedure: AMPUTATION TOE-4TH MPJ;  Surgeon: Samara Deist, DPM;  Location: [place];  Service: Podiatry;  Laterality: Left;  IVA LOCAL Diabetic - oral meds   APPENDECTOMY     CATARACT EXTRACTION W/PHACO Right 06/23/2015   Procedure: CATARACT EXTRACTION PHACO AND INTRAOCULAR LENS PLACEMENT (IOC);  Surgeon: Estill Cotta, MD;  Location: [facility];  Service: Ophthalmology;  Laterality: Right;  [place] 01:28 AP% 23.4 CDE 36.14 fluid pack lot # [id number]   CATARACT EXTRACTION W/PHACO Left 07/28/2015   Procedure: CATARACT EXTRACTION PHACO AND INTRAOCULAR LENS PLACEMENT (IOC);  Surgeon: Estill Cotta, MD;  Location: [facility];  Service: Ophthalmology;  Laterality: Left;  [place]    1:29.7 AP%  24.9 CDE   41.32 fluid casette lot [id number] H exp 09/23/2016   COONOSCOPY     AND ENDOSCOPY   DILATION AND  CURETTAGE OF UTERUS     ENDARTERECTOMY Left 11/13/2020   Procedure: Evacuation of left neck hematoma;  Surgeon: Katha Cabal, MD;  Location: [facility];  Service: Vascular;  Laterality: Left;   ENDARTERECTOMY Left 11/13/2020   Procedure: ENDARTERECTOMY CAROTID;  Surgeon: Algernon Huxley, MD;  Location: [facility];  Service: Vascular;  Laterality: Left;   TONSILLECTOMY     TUBAL LIGATION      There were no vitals filed for this visit.   Subjective Assessment - 06/16/21 1108     Subjective  Pt. reports being cold today.    Patient is accompanied by: Family member    Pertinent History R ankle pain, gout, T2DM, HTN, CKDIII; R/L carotid endarectomy    Patient Stated Goals "I want to improve my aim when I'm using my R arm."    Currently in Pain? No/denies            Neuromuscular re-education:  Pt. worked on right hand [facility] skills grasping 1/2" circular tipped pegs, storing them in the palm of her hand, and performing translatory movements moving the pegs from her palm to the tip of her 2nd digit, and thumb in preparation for placing them onto a pegboard placed at the tabletop. Pt. Worked on removing the pegs while using bilateral alternating hand movements.  Pt. continues to  make progress with right hand [facility] skills. Pt. Has improved with the accuracy of moving grasping pegs from slick, flat surfaces, as well as storing them in her hand. Pt. required increased time to perform the translatory movements, and move the pegs through her hand to the tip pf her 2nd digit and thumb. Pt. Tends to drop multiple pegs when moving the pegs to the tip of her right thumb. Pt. Continues to work on improving right hand function, and [facility] skills in order to manipulate objects for ADL, and IADL tasks efficiently, and accurately.                    OT Education - 06/16/21 1108     Education Details RUE functioning.    Person(s) Educated Patient    Methods Explanation;Verbal  cues;Demonstration;Tactile cues    Comprehension Verbalized understanding;Verbal cues required;Returned demonstration              OT Short Term Goals - 03/23/21 1657       OT SHORT TERM GOAL #1   Title Pt will perform HEP for RUE strength/coordination independently.    Baseline Eval: HEP not yet established in outpatient, but pt is working with putty from [name]; 12/04/2020; [facility] HEP initiated this day with mod vc after return demo; 01/05/2021: indep with currently program, but ongoing as pt progresses; 01/28/2021: vc to revisit and focus on grip and pinch strengthening with putty; 03/23/21: pt is indep with HEP    Time 6    Period Weeks    Status Achieved    Target Date 03/23/21               OT Long Term Goals - 04/30/21 1126       OT LONG TERM GOAL #1   Title Pt will improve hand writing to 100% legibility with R hand to be able to independently write a check.    Baseline Eval: signature is 75% legible with R hand, requiring extra time; 12/04/2020: no chan to complete.ge from eval; 01/05/2021: Printing is 100% legible, signature is 90% legible, but still requires extra time and effort.; 01/26/21: Signature is 90% legible and speed/fluidity have improved. 20th visit: 90% legible, 03/23/21: 90% legible, extra time 40th visit 90 % legibility with increased time    Time 12    Period Weeks    Status On-going    Target Date 07/14/21      OT LONG TERM GOAL #2   Title Pt will improve R hand coordination to enable good manipulation of iphone using R dominant hand    Baseline Eval: Pt uses L hand d/t R hand lacking sufficient [place] to press buttons on phone; 12/04/2020: no change from eval; 01/05/2021: R hand coordination is improving but pt still uses L hand to dial phone; 01/28/21: Pt using R dominant hand to press buttons and apps on phone 50% of the time.20th visit: Right hand [facility] skilols are improving, Pt. is improving with manipulation of the iphone; 03/23/21: Pt now consistently using R  hand to press buttons on phone with good accuracy.40th visit: Pt. continues to have difficulty modifying the accurate amount of pressure needed to wipe the phone.    Time 12    Period Weeks    Status On-going    Target Date 07/14/21      OT LONG TERM GOAL #3   Title Pt will improve GMC throughout RUE to enable pt to reach for and pick up ADL supplies with R  dominant hand without dropping or knocking over objects.    Baseline Eval: Pt reports RUE is clumsy and easily knocks over objects when trying to reach for ADL supplies.  Pt verbalizes that her "aim is off."; 12/04/2020: pt reports slight improvement since eval and has started using her R hand to eat, but still feels clumsy; 01/05/2021: Greatly improved; pt is using silverware to eat with R hand, can comb hair with RUE, still mildly ataxic; 01/26/21: pt reports difficulty picking up objects within a small space (ie; may knock the toothpaste holder down when reaching for toothbrush), but reaching for light/larger objects is improving 20th visit: pt. continues to be mildly ataxic, however is consistently improving with motor control, and [place] while reaching targets; 03/23/21: Pt reaches with accuracy towards targets if she takes her time (pt drops and knocks things over when moving at a faster/normal pace) 04/23/2021: Pt. continues to present with difficulty with depth perception, and impaired [place].40th visit: Pt. is improving, however tends to knock over lighter weight objects.    Time 12    Period Weeks    Status On-going    Target Date 07/14/21      OT LONG TERM GOAL #4   Title Pt will improve FOTO score to 68 or better to indicate measurable functional improvement.    Baseline Eval: FOTO 55; 01/05/2021: FOTO 61; 01/28/21: FOTO 61, 20th visit: 61; 03/23/21: FOTO 60 4oth visit: FOTO score: 61    Time 12    Period Weeks    Status On-going    Target Date 07/14/21      OT LONG TERM GOAL #5   Title Pt will perform dynamic standing tasks with modified  indep, AD as needed in order to reduce fall risk with ADLs                   Plan - 06/16/21 1112     Clinical Impression Statement Pt. continues to make progress with right hand [facility] skills. Pt. Has improved with the accuracy of moving grasping pegs from slick, flat surfaces, as well as storing them in her hand. Pt. required increased time to perform the translatory movements, and move the pegs through her hand to the tip pf her 2nd digit and thumb. Pt. Tends to drop multiple pegs when moving the pegs to the tip of her right thumb. Pt. Continues to work on improving right hand function, and [facility] skills in order to manipulate objects for ADL, and IADL tasks efficiently, and accurately.    OT Occupational Profile and History Detailed Assessment- Review of Records and additional review of physical, cognitive, psychosocial history related to current functional performance    Occupational performance deficits (Please refer to evaluation for details): ADL's;Leisure;IADL's    Body Structure / Function / Physical Skills ADL;Coordination;Endurance;GMC;UE functional use;Balance;Sensation;Body mechanics;IADL;Pain;Dexterity;FMC;Strength;Gait;Mobility    Rehab Potential Good    Clinical Decision Making Several treatment options, min-mod task modification necessary    Comorbidities Affecting Occupational Performance: May have comorbidities impacting occupational performance    Modification or Assistance to Complete Evaluation  Min-Moderate modification of tasks or assist with assess necessary to complete eval    OT Frequency 2x / week    OT Duration 12 weeks    OT Treatment/Interventions Self-care/ADL training;Therapeutic exercise;DME and/or AE instruction;Functional Mobility Training;Balance training;Neuromuscular education;Therapeutic activities;Patient/family education    Consulted and Agree with Plan of Care Patient             Patient will benefit from skilled therapeutic intervention  in  order to improve the following deficits and impairments:   Body Structure / Function / Physical Skills: ADL, Coordination, Endurance, GMC, UE functional use, Balance, Sensation, Body mechanics, IADL, Pain, Dexterity, FMC, Strength, Gait, Mobility       Visit Diagnosis: Other lack of coordination    Problem List Patient Active Problem List   Diagnosis Date Noted   Carotid stenosis, symptomatic, with infarction ([place]) 11/13/2020   Gout flare 11/10/2020   UTI (urinary tract infection) 11/10/2020   Left carotid artery stenosis 11/10/2020   Chronic kidney disease 11/10/2020   Left basal ganglia embolic stroke ([place]) [id number]   PAC (premature atrial contraction)    Pre-procedural cardiovascular examination    Ischemic stroke ([place]) 10/20/2020   TIA (transient ischemic attack) 10/19/2020   Right hemiparesis ([place]) 10/19/2020   Type 2 diabetes mellitus with stage 3 chronic kidney disease, without long-term current use of insulin ([place]) 08/24/2018   Hyperlipidemia, unspecified 07/19/2017   Hypertension 07/19/2017   PUD (peptic ulcer disease) 07/19/2017   Type 2 diabetes mellitus ([place]) 07/19/2017   Personal history of gout 08/12/2016   Anemia, unspecified 04/09/2016   Hypothyroidism, acquired 12/10/2015   Harrel Carina, MS, OTR/L   Harrel Carina, OT 06/16/2021, 11:24 AM  [place] MAIN [facility] SERVICES [address] [place], [place], [address] Phone: [phone]   Fax:  [phone]  Name: Cassandra Schaefer MRN: [id number] Date of Birth: 1928/12/21

## 2021-06-16 NOTE — Therapy (Signed)
[place] [facility] SERVICES [address] [place], [place], [address] Phone: [phone]   Fax:  [phone]  Physical Therapy Treatment  Patient Details  Name: Cassandra Schaefer MRN: [id number] Date of Birth: 10-Jul-1928 Referring Provider (PT): Dr. Dew/Dr. Ginette Pitman PCP   Encounter Date: 06/16/2021   PT End of Session - 06/16/21 1145     Visit Number [id number]    Number of Visits 41    Date for PT Re-Evaluation 06/23/21    Authorization Type [employer]    Authorization Time Period 02/02/21-04/27/21    Progress Note Due on Visit 19    PT Start Time 1148    PT Stop Time 1230    PT Time Calculation (min) 42 min    Equipment Utilized During Treatment Gait belt    Activity Tolerance Patient tolerated treatment well    Behavior During Therapy WFL for tasks assessed/performed             Past Medical History:  Diagnosis Date   Anemia    Cough    RESOLVING, NO FEVER   Diabetes mellitus without complication ([place])    Gout    Hypertension    Hypothyroidism    PUD (peptic ulcer disease)    Stroke [facility])    Wears dentures    full upper, partial lower    Past Surgical History:  Procedure Laterality Date   AMPUTATION TOE Left 12/14/2017   Procedure: AMPUTATION TOE-4TH MPJ;  Surgeon: Samara Deist, DPM;  Location: [place];  Service: Podiatry;  Laterality: Left;  IVA LOCAL Diabetic - oral meds   APPENDECTOMY     CATARACT EXTRACTION W/PHACO Right 06/23/2015   Procedure: CATARACT EXTRACTION PHACO AND INTRAOCULAR LENS PLACEMENT (IOC);  Surgeon: Estill Cotta, MD;  Location: [facility];  Service: Ophthalmology;  Laterality: Right;  [place] 01:28 AP% 23.4 CDE 36.14 fluid pack lot # [id number]   CATARACT EXTRACTION W/PHACO Left 07/28/2015   Procedure: CATARACT EXTRACTION PHACO AND INTRAOCULAR LENS PLACEMENT (IOC);  Surgeon: Estill Cotta, MD;  Location: [facility];  Service: Ophthalmology;  Laterality: Left;  [place]    1:29.7 AP%  24.9 CDE    41.32 fluid casette lot #[id number] H exp 09/23/2016   COONOSCOPY     AND ENDOSCOPY   DILATION AND CURETTAGE OF UTERUS     ENDARTERECTOMY Left 11/13/2020   Procedure: Evacuation of left neck hematoma;  Surgeon: Katha Cabal, MD;  Location: [facility];  Service: Vascular;  Laterality: Left;   ENDARTERECTOMY Left 11/13/2020   Procedure: ENDARTERECTOMY CAROTID;  Surgeon: Algernon Huxley, MD;  Location: [facility];  Service: Vascular;  Laterality: Left;   TONSILLECTOMY     TUBAL LIGATION      There were no vitals filed for this visit.   Subjective Assessment - 06/16/21 1151     Subjective Patient reports doing well; No pain reported; Reports being able to walk in the store with buggy with good stability; She reports HEP is going well, she believes she is ready for some harder exercise.    Patient is accompained by: --   Daughter, [name]   Pertinent History 88 y.o. female with medical history significant for type 2 diabetes mellitus, essential hypertension, acquired hypothyroidism, hyperlipidemia, stage III chronic kidney disease reports increased right sided weakness on 10/19/20. She was diagnosed with left basal ganglia ischemic stroke. She was discharged to inpatient rehab for 10 days and then discharged home. Patient was evaluated by outpatient PT on 11/12/20. CVA was due  to carotid stenosis. She underwent left carotid endartectomy on 7/21. Procedure went well however patient suffered from left cervical hematoma and had subsequent surgical procedure to stop the bleed and remove the hematoma on 11/13/20. They were concerned with her breathing and therefore intubated her for 1 day, she was in ICU for 2 days and transferred to med/surge on 11/15/20. Patient was discharged home on 11/17/20 and is now returning to outpatient PT. She has had the stiches removed and is healing well. Denies any neck pain or stiffness. She lives with her daughter and has 24/7 caregiver support. She is still using RW for most ambulation.  She is able to transfer to bedside commode well and is able to stand short time unsupported. She reports feeling very weak and fatigued. She reports she has been dragging her right foot more. She denies any numbness/tingling; She reports feeling like her legs are wanting to do more but is hesitant because of fear of falling. She is mod I for self care morning routine. She does still receive help with showering. She has been working on increasing her activity but denies any formal exercise.    Limitations Standing;Walking    How long can you sit comfortably? NA    How long can you stand comfortably? 30-60 min with support    How long can you walk comfortably? about 100 feet with RW, still limited to short distances;    Diagnostic tests MRI shows left basal ganglia infarct 10/21/20    Patient Stated Goals "Improve balance and walking and not need RW, get back to independent."    Currently in Pain? No/denies                    TREATMENT:    Warm up on Nustep BUE/BLE level 3 x3 min (unbilled);  Advanced HEP: Standing with green tband around BLE: -hip abduction x10 reps each LE -hip extension x10 reps each LE -side stepping x10 feet x1 lap each direction Required rail assist for safety; Required min VCs to keep foot oriented forward to challenge LE strengthening with better muscle activation. She reports increased difficulty with increased resistance;    INTERVENTIONS - gait belt donned and CGA used with all upright interventions unless otherwise specified     NMR:   Gait around gym x150 feet with tripod base cane requiring CGA for safety with cues for increased step length and to improve RLE foot clearance; Challenged patient with turning head side/side calling out objects on wall when in hallway, required min A with uncoordinated gait and increased lateral instability with increased right foot drag;   Standing in parallel bars:   Standing on firm surface: Forward step over  orange hurdle with RLE only, landing on RLE heel to challenge foot clearance x5 reps, progressed from 2 rail to 1 rail to finger tip hold x15 reps total; reports moderate difficulty with decreased coordination;  Side step over orange hurdle with RLE only x5 reps, progressing to reaching with RUE to target outside base of support to challenge weight shift x5 reps;    Gait around cones #5 with tripod base cane x4 sets with min A for safety and cues to increase turn, increase RLE heel strike and improve gait speed. She exhibits increased right foot drag with added difficulty;  Advanced HEP with tandem gait in parallel bars with 1 rail assist x2 laps with backward walking requiring cues for proper positioning;  Standing on firm surface: tandem stance 10 sec hold  with intermittent rail assist x1 rep each foot in front;    Advanced HEP- see patient instructions;   Pt educated throughout session about proper posture and technique with exercises. Improved exercise technique, movement at target joints, use of target muscles after min to mod verbal, visual, tactile cues.   Patient tolerated session well. She does report increased fatigue at end of session. Patient pleased with HEP;                                  PT Education - 06/16/21 1144     Education Details exercise technique/positioning;    Person(s) Educated Patient    Methods Explanation;Verbal cues    Comprehension Verbalized understanding;Returned demonstration;Verbal cues required;Need further instruction              PT Short Term Goals - 04/28/21 1152       PT SHORT TERM GOAL #1   Title Patient will be adherent to HEP at least 3x a week to improve functional strength and balance for better safety at home.    Baseline 9/21: Pt reports compliance with HEP and is confident, 1/3: doing exercise 2x a week    Time 4    Period Weeks    Status Partially Met    Target Date 05/26/21      PT SHORT TERM GOAL  #2   Title Patient will be independent in transferring sit<>Stand without pushing on arm rests to improve ability to get up from chair.    Baseline 9/21: pt able to complete STS without use of UEs, 1/3:able to stand but has moderate difficulty requiring increased time;    Time 4    Period Weeks    Status Partially Met    Target Date 05/26/21               PT Long Term Goals - 04/28/21 1153       PT LONG TERM GOAL #1   Title Patient (> 26 years old) will complete five times sit to stand test in < 15 seconds indicating an increased LE strength and improved balance.    Baseline 9/21: 29 seconds hands-free 10/10: deferred 10/12: 34 sec hands free; 11/2: 23.8 sec hands-free; 12/7: 33.2 sec hands free, 1/3: 33.92 hands free    Time 8    Period Weeks    Status On-going    Target Date 06/23/21      PT LONG TERM GOAL #2   Title Patient will increase six minute walk test distance to >400 feet for improve gait ability    Baseline 9/21: 368 ft with RW; 10/10: deferred 10/12: 408 ft; 11/2: 476 ft with 4WW, 1/3: 475 feet with RW    Time 8    Period Weeks    Status Achieved    Target Date 06/23/21      PT LONG TERM GOAL #3   Title Patient will increase 10 meter walk test to >1.94ms as to improve gait speed for better community ambulation and to reduce fall risk.    Baseline 9/21: 0.5 m/s with RW; 10/10 deferred 10/12: 0.93 m/s with 42PQ 11/2: 0.83 m/s with 4WW; 12/7: 0.52 m/s with RW, 1/3: 0.704    Time 8    Period Weeks    Status Partially Met    Target Date 06/23/21      PT LONG TERM GOAL #4   Title Patient will be independent  with ascend/descend 6 steps using single UE in step over step pattern without LOB.    Baseline 9/21:  Patient uses PT stairs (4 steps) with bilateral upper extremity support on handrails for both ascending/descending.  Patient exhibits reciprocal pattern ascending and step to pattern descending. 10/10: deferred; 10/12: ascending/descending recip. steps with BUE  support; 11/2: Ascended/descended 8 steps total, reciprocal pattern ascending and step-to descending. Pt uses UUE support throughout with CGA assist.; 12/7: Ascended and descended 8 steps total with UUE support and CGA. Reciprocal stepping ascending and step to descending., 1/3: Deferrred    Time 8    Period Weeks    Status Partially Met    Target Date 06/23/21      PT LONG TERM GOAL #5   Title Patient will demonstrate an improved Berg Balance Score of >45/56 as to demonstrate improved balance with ADLs such as sitting/standing and transfer balance and reduced fall risk.    Baseline 9/21: deferred d/t time; 9/29: 42/56; 10/10: deferred; 10/12: 44/56; 11/2: deferred; 11/7: 46/56, 1/3: deferred    Time 8    Period Weeks    Status Achieved    Target Date 06/23/21      PT LONG TERM GOAL #6   Title Patient will improve FOTO score to >60% to indicate improved functional mobility with ADLs.    Baseline 9/21: 59; 10/10: 57; 11/2: 60; 12/7: 58%, 1/3: 61%    Time 8    Period Weeks    Status Achieved    Target Date 06/23/21                   Plan - 06/17/21 0820     Clinical Impression Statement Patient motivated and participated well within session. she was instructed in advanced HEP to challenge LE strength and balance. Progressed strengthening with increased resistance band. Patient also instructed in advanced balance exercise with tandem gait and tandem stance to challenge stance control with narrow base of support. She requires intermittent rail assist. Patient reports moderate difficulty with tandem stance. She was instructed in advanced dynamic balance with gait with head turns and walking around obstacles. She is able to exhibit better reciprocal gait and coordination with cane when walking forward. She exhibits increased lateral instability and incoordination with added difficulty of dual task (head turns) and when walking around cones. She did require min A for safety. She would  benefit from additional skilled PT intervention to improve strength, balance and mobility;    Personal Factors and Comorbidities Age    Comorbidities HTN, CKD, fall risk, type 2 diabetes,    Examination-Activity Limitations Lift;Locomotion Level;Squat;Stairs;Stand;Toileting;Transfers;Bathing    Examination-Participation Restrictions Cleaning;Community Activity;Laundry;Meal Prep;Shop;Volunteer;Driving    Stability/Clinical Decision Making Stable/Uncomplicated    Rehab Potential Poor    PT Frequency 2x / week    PT Duration 8 weeks    PT Treatment/Interventions Cryotherapy;Electrical Stimulation;Moist Heat;Gait training;Stair training;Functional mobility training;Therapeutic activities;Therapeutic exercise;Neuromuscular re-education;Balance training;Patient/family education;Orthotic Fit/Training;Energy conservation    PT Next Visit Plan work on LE strengthening and balance; gait training, endurance, continue POC as previously indicated    PT Home Exercise Plan Access Code: [email or web address]; no updates    Consulted and Agree with Plan of Care Patient             Patient will benefit from skilled therapeutic intervention in order to improve the following deficits and impairments:  Abnormal gait, Decreased balance, Decreased endurance, Decreased mobility, Difficulty walking, Cardiopulmonary status limiting activity, Decreased activity tolerance, Decreased coordination, Decreased strength  Visit  Diagnosis: Other lack of coordination  Muscle weakness (generalized)  Unsteadiness on feet  Other abnormalities of gait and mobility  Difficulty in walking, not elsewhere classified  Abnormality of gait and mobility     Problem List Patient Active Problem List   Diagnosis Date Noted   Carotid stenosis, symptomatic, with infarction ([place]) 11/13/2020   Gout flare 11/10/2020   UTI (urinary tract infection) 11/10/2020   Left carotid artery stenosis 11/10/2020   Chronic kidney disease 11/10/2020    Left basal ganglia embolic stroke ([place]) 16/43/5391   PAC (premature atrial contraction)    Pre-procedural cardiovascular examination    Ischemic stroke ([place]) 10/20/2020   TIA (transient ischemic attack) 10/19/2020   Right hemiparesis ([place]) 10/19/2020   Type 2 diabetes mellitus with stage 3 chronic kidney disease, without long-term current use of insulin ([place]) 08/24/2018   Hyperlipidemia, unspecified 07/19/2017   Hypertension 07/19/2017   PUD (peptic ulcer disease) 07/19/2017   Type 2 diabetes mellitus ([place]) 07/19/2017   Personal history of gout 08/12/2016   Anemia, unspecified 04/09/2016   Hypothyroidism, acquired 12/10/2015    Zacharius Funari, PT, DPT 06/17/2021, 8:33 AM  [facility] [address] [place], [place], [address] Phone: [phone]   Fax:  [phone]  Name: Cassandra Schaefer MRN: [id number] Date of Birth: 1928/12/23

## 2021-06-16 NOTE — Patient Instructions (Signed)
Access Code: [email or web address] URL: [email or web address] Date: 06/16/2021 Prepared by: Blanche East  Exercises Walking Tandem Stance - 1 x daily - 7 x weekly - 1 sets - 3 reps Standing Tandem Balance with Counter Support - 1 x daily - 7 x weekly - 1 sets - 3 reps - 10 sec hold

## 2021-06-18 ENCOUNTER — Encounter: Payer: Self-pay | Admitting: Occupational Therapy

## 2021-06-18 ENCOUNTER — Encounter: Payer: Medicare HMO | Admitting: Speech Pathology

## 2021-06-18 ENCOUNTER — Ambulatory Visit: Payer: Medicare HMO | Admitting: Physical Therapy

## 2021-06-18 ENCOUNTER — Ambulatory Visit: Payer: Medicare HMO | Admitting: Occupational Therapy

## 2021-06-18 ENCOUNTER — Other Ambulatory Visit: Payer: Self-pay

## 2021-06-18 ENCOUNTER — Encounter: Payer: Self-pay | Admitting: Physical Therapy

## 2021-06-18 DIAGNOSIS — R2681 Unsteadiness on feet: Secondary | ICD-10-CM

## 2021-06-18 DIAGNOSIS — M6281 Muscle weakness (generalized): Secondary | ICD-10-CM

## 2021-06-18 DIAGNOSIS — R262 Difficulty in walking, not elsewhere classified: Secondary | ICD-10-CM

## 2021-06-18 DIAGNOSIS — R278 Other lack of coordination: Secondary | ICD-10-CM

## 2021-06-18 DIAGNOSIS — R2689 Other abnormalities of gait and mobility: Secondary | ICD-10-CM

## 2021-06-18 NOTE — Therapy (Signed)
[place] MAIN [facility] SERVICES [address] [place], [place], [address] Phone: [phone]   Fax:  [phone]  Physical Therapy Treatment  Patient Details  Name: Cassandra Schaefer MRN: [id number] Date of Birth: 12/07/1928 Referring Provider (PT): Dr. Dew/Dr. Ginette Pitman PCP   Encounter Date: 06/18/2021   PT End of Session - 06/18/21 1147     Visit Number [id number]    Number of Visits 43    Date for PT Re-Evaluation 06/23/21    Authorization Type [employer]    Authorization Time Period 02/02/21-04/27/21    Progress Note Due on Visit 5    PT Start Time 1147    PT Stop Time 1230    PT Time Calculation (min) 43 min    Equipment Utilized During Treatment Gait belt    Activity Tolerance Patient tolerated treatment well    Behavior During Therapy WFL for tasks assessed/performed             Past Medical History:  Diagnosis Date   Anemia    Cough    RESOLVING, NO FEVER   Diabetes mellitus without complication ([place])    Gout    Hypertension    Hypothyroidism    PUD (peptic ulcer disease)    Stroke [facility])    Wears dentures    full upper, partial lower    Past Surgical History:  Procedure Laterality Date   AMPUTATION TOE Left 12/14/2017   Procedure: AMPUTATION TOE-4TH MPJ;  Surgeon: Samara Deist, DPM;  Location: [facility];  Service: Podiatry;  Laterality: Left;  IVA LOCAL Diabetic - oral meds   APPENDECTOMY     CATARACT EXTRACTION W/PHACO Right 06/23/2015   Procedure: CATARACT EXTRACTION PHACO AND INTRAOCULAR LENS PLACEMENT (IOC);  Surgeon: Estill Cotta, MD;  Location: [facility];  Service: Ophthalmology;  Laterality: Right;  [place] 01:28 AP% 23.4 CDE 36.14 fluid pack lot # [id number]   CATARACT EXTRACTION W/PHACO Left 07/28/2015   Procedure: CATARACT EXTRACTION PHACO AND INTRAOCULAR LENS PLACEMENT (IOC);  Surgeon: Estill Cotta, MD;  Location: [facility];  Service: Ophthalmology;  Laterality: Left;  [place]    1:29.7 AP%  24.9 CDE    41.32 fluid casette lot #[id number] H exp 09/23/2016   COONOSCOPY     AND ENDOSCOPY   DILATION AND CURETTAGE OF UTERUS     ENDARTERECTOMY Left 11/13/2020   Procedure: Evacuation of left neck hematoma;  Surgeon: Katha Cabal, MD;  Location: [facility];  Service: Vascular;  Laterality: Left;   ENDARTERECTOMY Left 11/13/2020   Procedure: ENDARTERECTOMY CAROTID;  Surgeon: Algernon Huxley, MD;  Location: [facility];  Service: Vascular;  Laterality: Left;   TONSILLECTOMY     TUBAL LIGATION      There were no vitals filed for this visit.   Subjective Assessment - 06/18/21 1147     Subjective Patient reports doing well. No pain; Reports doing okay with new HEP- no questions;    Patient is accompained by: --   Daughter, [name]   Pertinent History 88 y.o. female with medical history significant for type 2 diabetes mellitus, essential hypertension, acquired hypothyroidism, hyperlipidemia, stage III chronic kidney disease reports increased right sided weakness on 10/19/20. She was diagnosed with left basal ganglia ischemic stroke. She was discharged to inpatient rehab for 10 days and then discharged home. Patient was evaluated by outpatient PT on 11/12/20. CVA was due to carotid stenosis. She underwent left carotid endartectomy on 7/21. Procedure went well however patient suffered from left cervical hematoma and  had subsequent surgical procedure to stop the bleed and remove the hematoma on 11/13/20. They were concerned with her breathing and therefore intubated her for 1 day, she was in ICU for 2 days and transferred to med/surge on 11/15/20. Patient was discharged home on 11/17/20 and is now returning to outpatient PT. She has had the stiches removed and is healing well. Denies any neck pain or stiffness. She lives with her daughter and has 24/7 caregiver support. She is still using RW for most ambulation. She is able to transfer to bedside commode well and is able to stand short time unsupported. She reports feeling very  weak and fatigued. She reports she has been dragging her right foot more. She denies any numbness/tingling; She reports feeling like her legs are wanting to do more but is hesitant because of fear of falling. She is mod I for self care morning routine. She does still receive help with showering. She has been working on increasing her activity but denies any formal exercise.    Limitations Standing;Walking    How long can you sit comfortably? NA    How long can you stand comfortably? 30-60 min with support    How long can you walk comfortably? about 100 feet with RW, still limited to short distances;    Diagnostic tests MRI shows left basal ganglia infarct 10/21/20    Patient Stated Goals "Improve balance and walking and not need RW, get back to independent."    Currently in Pain? No/denies                   TREATMENT:    Warm up on Nustep BUE/BLE level 3 x3 min (unbilled);   Seated; RLE ankle DF red tband x15 reps, easy, green tband x15 reps, easy;   Leg press: BLE 30# x10 reps with moderate difficulty reported, requiring min VCs for proper positioning and exercise technique including to move LE slowly for better motor control;    INTERVENTIONS - gait belt donned and CGA used with all upright interventions unless otherwise specified     NMR:   Gait around gym x150 feet with tripod base cane requiring CGA for safety with cues for increased step length and to improve RLE foot clearance; Challenged patient with turning head side/side calling out objects on wall when in hallway, required min A with uncoordinated gait and increased lateral instability with increased right foot drag;   Standing beside support system:    Standing on firm surface: Forward step over 1/2 bolster with RLE only, landing on RLE heel to challenge foot clearance x5 reps, progressed from 1 rail to 0 HH hold x10 reps total; reports moderate difficulty with decreased coordination;  Side step over 1/2 bolster  with BLE x10 reps each direction with finger tip hold progressing to no rail assist Required CGA for safety and cues for increased step length;   Standing on 1/2 bolster:(flat side up) -heel/toe rock x15 reps with rail assist to challenge ankle control; -feet apart, neutral position  Unsupported standing x20 sec with cues for ankle strategies for better neutral weight shift, min A for safety  Unsupported, head turns side/side x5 reps with CGA for safety  Finger tip hold, mini squat x10 reps;  Patient reports moderate difficulty standing on 1/2 bolster requiring CGA to min A and cues for ankle strategies to challenge stance control;      Gait around cones #5 with tripod base cane x1 sets with min A for safety and  cues to increase turn, increase RLE heel strike and improve gait speed. She exhibits increased right foot drag with added difficulty; progressed to x3 sets without AD requiring min A for safety and cues for better RLE foot clearance;   Gait in hallway x150 feet without AD, min  A for safety with cues for increased RLE heel strike, increase step length. She was able to exhibit better reciprocal pattern without AD with better coordination and speed, however continues to have unsteadiness due to weakness and imbalance. Consider progressing gait training without AD to facilitate better gait mobility;    APt educated throughout session about proper posture and technique with exercises. Improved exercise technique, movement at target joints, use of target muscles after min to mod verbal, visual, tactile cues.   Patient tolerated session well. She does report increased fatigue at end of session. Patient pleased with HEP;                               PT Education - 06/18/21 1147     Education Details exercise technique/positioning;    Person(s) Educated Patient    Methods Explanation;Verbal cues    Comprehension Verbalized understanding;Returned demonstration;Verbal  cues required;Need further instruction              PT Short Term Goals - 04/28/21 1152       PT SHORT TERM GOAL #1   Title Patient will be adherent to HEP at least 3x a week to improve functional strength and balance for better safety at home.    Baseline 9/21: Pt reports compliance with HEP and is confident, 1/3: doing exercise 2x a week    Time 4    Period Weeks    Status Partially Met    Target Date 05/26/21      PT SHORT TERM GOAL #2   Title Patient will be independent in transferring sit<>Stand without pushing on arm rests to improve ability to get up from chair.    Baseline 9/21: pt able to complete STS without use of UEs, 1/3:able to stand but has moderate difficulty requiring increased time;    Time 4    Period Weeks    Status Partially Met    Target Date 05/26/21               PT Long Term Goals - 04/28/21 1153       PT LONG TERM GOAL #1   Title Patient (> 40 years old) will complete five times sit to stand test in < 15 seconds indicating an increased LE strength and improved balance.    Baseline 9/21: 29 seconds hands-free 10/10: deferred 10/12: 34 sec hands free; 11/2: 23.8 sec hands-free; 12/7: 33.2 sec hands free, 1/3: 33.92 hands free    Time 8    Period Weeks    Status On-going    Target Date 06/23/21      PT LONG TERM GOAL #2   Title Patient will increase six minute walk test distance to >400 feet for improve gait ability    Baseline 9/21: 368 ft with RW; 10/10: deferred 10/12: 408 ft; 11/2: 476 ft with 4WW, 1/3: 475 feet with RW    Time 8    Period Weeks    Status Achieved    Target Date 06/23/21      PT LONG TERM GOAL #3   Title Patient will increase 10 meter walk test to >1.92ms as to  improve gait speed for better community ambulation and to reduce fall risk.    Baseline 9/21: 0.5 m/s with RW; 10/10 deferred 10/12: 0.93 m/s with 0YT; 11/2: 0.83 m/s with 0ZS; 12/7: 0.52 m/s with RW, 1/3: 0.704    Time 8    Period Weeks    Status Partially  Met    Target Date 06/23/21      PT LONG TERM GOAL #4   Title Patient will be independent with ascend/descend 6 steps using single UE in step over step pattern without LOB.    Baseline 9/21:  Patient uses PT stairs (4 steps) with bilateral upper extremity support on handrails for both ascending/descending.  Patient exhibits reciprocal pattern ascending and step to pattern descending. 10/10: deferred; 10/12: ascending/descending recip. steps with BUE support; 11/2: Ascended/descended 8 steps total, reciprocal pattern ascending and step-to descending. Pt uses UUE support throughout with CGA assist.; 12/7: Ascended and descended 8 steps total with UUE support and CGA. Reciprocal stepping ascending and step to descending., 1/3: Deferrred    Time 8    Period Weeks    Status Partially Met    Target Date 06/23/21      PT LONG TERM GOAL #5   Title Patient will demonstrate an improved Berg Balance Score of >45/56 as to demonstrate improved balance with ADLs such as sitting/standing and transfer balance and reduced fall risk.    Baseline 9/21: deferred d/t time; 9/29: 42/56; 10/10: deferred; 10/12: 44/56; 11/2: deferred; 11/7: 46/56, 1/3: deferred    Time 8    Period Weeks    Status Achieved    Target Date 06/23/21      PT LONG TERM GOAL #6   Title Patient will improve FOTO score to >60% to indicate improved functional mobility with ADLs.    Baseline 9/21: 59; 10/10: 57; 11/2: 60; 12/7: 58%, 1/3: 61%    Time 8    Period Weeks    Status Achieved    Target Date 06/23/21                   Plan - 06/18/21 1447     Clinical Impression Statement Patient motivated and participated well within session. She was instructed in advanced LE strengthening and balance exercise. She continues to exhibit right foot drag with added difficulty of gait (dual task) or when challenged. She requires  cues to facilitate better heel strike during standing activities. Gait was progressed to without AD this  session with patient required min A for safety. She does exhibit better reciprocal gait pattern without AD likely due to difficulty with UE coordination, however she continues to have instability without AD due to LE weakness and imbalance. She would benefit from additional skilled PT intervention to improve strength, balance and mobility; Reinforced HEP    Personal Factors and Comorbidities Age    Comorbidities HTN, CKD, fall risk, type 2 diabetes,    Examination-Activity Limitations Lift;Locomotion Level;Squat;Stairs;Stand;Toileting;Transfers;Bathing    Examination-Participation Restrictions Cleaning;Community Activity;Laundry;Meal Prep;Shop;Volunteer;Driving    Stability/Clinical Decision Making Stable/Uncomplicated    Rehab Potential Poor    PT Frequency 2x / week    PT Duration 8 weeks    PT Treatment/Interventions Cryotherapy;Electrical Stimulation;Moist Heat;Gait training;Stair training;Functional mobility training;Therapeutic activities;Therapeutic exercise;Neuromuscular re-education;Balance training;Patient/family education;Orthotic Fit/Training;Energy conservation    PT Next Visit Plan work on LE strengthening and balance; gait training, endurance, continue POC as previously indicated    PT Home Exercise Plan Access Code: [email or web address]; no updates    Consulted and Agree with Plan  of Care Patient             Patient will benefit from skilled therapeutic intervention in order to improve the following deficits and impairments:  Abnormal gait, Decreased balance, Decreased endurance, Decreased mobility, Difficulty walking, Cardiopulmonary status limiting activity, Decreased activity tolerance, Decreased coordination, Decreased strength  Visit Diagnosis: Muscle weakness (generalized)  Other lack of coordination  Unsteadiness on feet  Other abnormalities of gait and mobility  Difficulty in walking, not elsewhere classified     Problem List Patient Active Problem List   Diagnosis  Date Noted   Carotid stenosis, symptomatic, with infarction ([place]) 11/13/2020   Gout flare 11/10/2020   UTI (urinary tract infection) 11/10/2020   Left carotid artery stenosis 11/10/2020   Chronic kidney disease 11/10/2020   Left basal ganglia embolic stroke ([place]) 52/77/8242   PAC (premature atrial contraction)    Pre-procedural cardiovascular examination    Ischemic stroke ([place]) 10/20/2020   TIA (transient ischemic attack) 10/19/2020   Right hemiparesis ([place]) 10/19/2020   Type 2 diabetes mellitus with stage 3 chronic kidney disease, without long-term current use of insulin ([place]) 08/24/2018   Hyperlipidemia, unspecified 07/19/2017   Hypertension 07/19/2017   PUD (peptic ulcer disease) 07/19/2017   Type 2 diabetes mellitus ([place]) 07/19/2017   Personal history of gout 08/12/2016   Anemia, unspecified 04/09/2016   Hypothyroidism, acquired 12/10/2015    Akayla Brass, PT, DPT 06/18/2021, 2:49 PM  [place] MAIN [facility] SERVICES [address] [place], [place], [address] Phone: [phone]   Fax:  [phone]  Name: Peri Kreft MRN: [id number] Date of Birth: 04-Aug-1928

## 2021-06-18 NOTE — Therapy (Signed)
[place] MAIN [facility] SERVICES [place], [place], [address] Phone: [phone]   Fax:  [phone]  Occupational Therapy Progress Note  Dates of reporting period  04/30/2021   to   06/18/2021   Patient Details  Name: Cassandra Schaefer MRN: [id number] Date of Birth: 12/02/1928 Referring Provider (OT): Dr. Tracie Harrier   Encounter Date: 06/18/2021   OT End of Session - 06/18/21 1136     Visit Number 22    Number of Visits 29    Date for OT Re-Evaluation 07/14/21    Authorization Time Period Reporting period starting 02/16/2021    OT Start Time 1100    OT Stop Time 1145    OT Time Calculation (min) 45 min    Activity Tolerance Patient tolerated treatment well    Behavior During Therapy WFL for tasks assessed/performed             Past Medical History:  Diagnosis Date   Anemia    Cough    RESOLVING, NO FEVER   Diabetes mellitus without complication ([place])    Gout    Hypertension    Hypothyroidism    PUD (peptic ulcer disease)    Stroke [facility])    Wears dentures    full upper, partial lower    Past Surgical History:  Procedure Laterality Date   AMPUTATION TOE Left 12/14/2017   Procedure: AMPUTATION TOE-4TH MPJ;  Surgeon: Samara Deist, DPM;  Location: [place];  Service: Podiatry;  Laterality: Left;  IVA LOCAL Diabetic - oral meds   APPENDECTOMY     CATARACT EXTRACTION W/PHACO Right 06/23/2015   Procedure: CATARACT EXTRACTION PHACO AND INTRAOCULAR LENS PLACEMENT (IOC);  Surgeon: Estill Cotta, MD;  Location: [facility];  Service: Ophthalmology;  Laterality: Right;  [place] 01:28 AP% 23.4 CDE 36.14 fluid pack lot # [id number]   CATARACT EXTRACTION W/PHACO Left 07/28/2015   Procedure: CATARACT EXTRACTION PHACO AND INTRAOCULAR LENS PLACEMENT (IOC);  Surgeon: Estill Cotta, MD;  Location: [facility];  Service: Ophthalmology;  Laterality: Left;  [place]    1:29.7 AP%  24.9 CDE   41.32 fluid casette lot [id number] H exp  09/23/2016   COONOSCOPY     AND ENDOSCOPY   DILATION AND CURETTAGE OF UTERUS     ENDARTERECTOMY Left 11/13/2020   Procedure: Evacuation of left neck hematoma;  Surgeon: Katha Cabal, MD;  Location: [facility];  Service: Vascular;  Laterality: Left;   ENDARTERECTOMY Left 11/13/2020   Procedure: ENDARTERECTOMY CAROTID;  Surgeon: Algernon Huxley, MD;  Location: [facility];  Service: Vascular;  Laterality: Left;   TONSILLECTOMY     TUBAL LIGATION      There were no vitals filed for this visit.   Subjective Assessment - 06/18/21 1119     Subjective  Pt. reports that her hands are stiff today.    Patient is accompanied by: Family member    Pertinent History R ankle pain, gout, T2DM, HTN, CKDIII; R/L carotid endarectomy    Currently in Pain? No/denies                [facility] OT Assessment - 06/18/21 1104       Coordination   Right 9 Hole Peg Test 1 min. & 6 sec.      Strength   Overall Strength Comments BUE strength 5/5 overall      Hand Function   Right Hand Grip (lbs) 32    Left Hand Grip (lbs) 27  Neuromuscular re-education:   Pt. worked on right hand [facility] skills grasping 1/2" circular tipped pegs, storing them in the palm of her hand, and performing translatory movements moving the pegs from her palm to the tip of her 2nd digit, and thumb in preparation for placing them onto a pegboard placed at the tabletop. Pt. worked on removing the pegs while using bilateral alternating hand movements.   Measurements were obtained, and goals were reviewed with the pt. Pt. Has made progress, and is able to use her right hand more during ADLs, and IADL tasks. Pt. Is able to use her right hand to feed herself, wash her hair, and reach behind her to retrieve items securely in her right hand. Pt. Reports that her right hand is stiff today, and had more difficulty with [facility] skills grasping the pegs on the 9-hole peg test. Pt.'s FOTO score is 69. Pt. has improved with the accuracy of  moving grasping pegs from slick, flat surfaces, as well as storing them in her hand. Pt. required increased time to perform the translatory movements, and move the pegs through her hand to the tip pf her 2nd digit and thumb. Pt. tends to drop multiple pegs when moving the pegs to the tip of her right thumb. Pt. continues to work on improving right hand function, and [facility] skills in order to manipulate objects for ADL, and IADL tasks efficiently, and accurately.                      OT Education - 06/18/21 1120     Education Details RUE functioning.    Person(s) Educated Patient    Methods Explanation;Verbal cues;Demonstration;Tactile cues    Comprehension Verbalized understanding;Verbal cues required;Returned demonstration              OT Short Term Goals - 03/23/21 1657       OT SHORT TERM GOAL #1   Title Pt will perform HEP for RUE strength/coordination independently.    Baseline Eval: HEP not yet established in outpatient, but pt is working with putty from [name]; 12/04/2020; [facility] HEP initiated this day with mod vc after return demo; 01/05/2021: indep with currently program, but ongoing as pt progresses; 01/28/2021: vc to revisit and focus on grip and pinch strengthening with putty; 03/23/21: pt is indep with HEP    Time 6    Period Weeks    Status Achieved    Target Date 03/23/21               OT Long Term Goals - 06/18/21 1120       OT LONG TERM GOAL #1   Title Pt will improve hand writing to 100% legibility with R hand to be able to independently write a check.    Baseline Eval: signature is 75% legible with R hand, requiring extra time; 12/04/2020: no chan to complete.ge from eval; 01/05/2021: Printing is 100% legible, signature is 90% legible, but still requires extra time and effort.; 01/26/21: Signature is 90% legible and speed/fluidity have improved. 20th visit: 90% legible, 03/23/21: 90% legible, extra time 40th visit 90 % legibility with increased time, 50th  visit: 90% legible with with increased time required    Time 12    Period Weeks    Status On-going    Target Date 07/14/21      OT LONG TERM GOAL #2   Title Pt will improve R hand coordination to enable good manipulation of iphone using R dominant hand  Baseline Eval: Pt uses L hand d/t R hand lacking sufficient FMC to press buttons on phone; 12/04/2020: no change from eval; 01/05/2021: R hand coordination is improving but pt still uses L hand to dial phone; 01/28/21: Pt using R dominant hand to press buttons and apps on phone 50% of the time.20th visit: Right hand [facility] skilols are improving, Pt. is improving with manipulation of the iphone; 03/23/21: Pt now consistently using R hand to press buttons on phone with good accuracy.40th visit: Pt. continues to have difficulty modifying the accurate amount of pressure needed to wipe the phone. 50th visit: Pt. is improving, however has difficulty modulating the amount of pressure needed for swiping the phone, as pt. swipes too hard.    Time 12    Period Weeks    Status On-going    Target Date 07/14/21      OT LONG TERM GOAL #3   Title Pt will improve GMC throughout RUE to enable pt to reach for and pick up ADL supplies with R dominant hand without dropping or knocking over objects.    Baseline Eval: Pt reports RUE is clumsy and easily knocks over objects when trying to reach for ADL supplies.  Pt verbalizes that her "aim is off."; 12/04/2020: pt reports slight improvement since eval and has started using her R hand to eat, but still feels clumsy; 01/05/2021: Greatly improved; pt is using silverware to eat with R hand, can comb hair with RUE, still mildly ataxic; 01/26/21: pt reports difficulty picking up objects within a small space (ie; may knock the toothpaste holder down when reaching for toothbrush), but reaching for light/larger objects is improving 20th visit: pt. continues to be mildly ataxic, however is consistently improving with motor control, and FMC  while reaching targets; 03/23/21: Pt reaches with accuracy towards targets if she takes her time (pt drops and knocks things over when moving at a faster/normal pace) 04/23/2021: Pt. continues to present with difficulty with depth perception, and impaired FMC.40th visit: Pt. is improving, however tends to knock over lighter weight objects.50th visit: Pt. has improved with reaching for items without knocking them over, however continues to need work on accuracy.    Time 12    Period Weeks    Status On-going    Target Date 07/14/21      OT LONG TERM GOAL #4   Title Pt will improve FOTO score to 68 or better to indicate measurable functional improvement.    Baseline Eval: FOTO 55; 01/05/2021: FOTO 61; 01/28/21: FOTO 61, 20th visit: 61; 03/23/21: FOTO 60 4oth visit: FOTO score: 61, 50th visit: FOTO 69    Time 12    Period Weeks    Status On-going    Target Date 07/14/21                   Plan - 06/18/21 1137     Clinical Impression Statement Measurements were obtained, and goals were reviewed with the pt. Pt. Has made progress, and is able to use her right hand more during ADLs, and IADL tasks. Pt. Is able to use her right hand to feed herself, wash her hair, and reach behind her to retrieve items securely in her right hand. Pt. Reports that her right hand is stiff today, and had more difficulty with [facility] skills grasping the pegs on the 9-hole peg test. Pt.'s FOTO score is 69. Pt. has improved with the accuracy of moving grasping pegs from slick, flat surfaces, as well as storing  them in her hand. Pt. required increased time to perform the translatory movements, and move the pegs through her hand to the tip pf her 2nd digit and thumb. Pt. tends to drop multiple pegs when moving the pegs to the tip of her right thumb. Pt. continues to work on improving right hand function, and [facility] skills in order to manipulate objects for ADL, and IADL tasks efficiently, and accurately.        OT Occupational  Profile and History Detailed Assessment- Review of Records and additional review of physical, cognitive, psychosocial history related to current functional performance    Occupational performance deficits (Please refer to evaluation for details): ADL's;Leisure;IADL's    Body Structure / Function / Physical Skills ADL;Coordination;Endurance;GMC;UE functional use;Balance;Sensation;Body mechanics;IADL;Pain;Dexterity;FMC;Strength;Gait;Mobility    Rehab Potential Good    Clinical Decision Making Several treatment options, min-mod task modification necessary    Comorbidities Affecting Occupational Performance: May have comorbidities impacting occupational performance    Modification or Assistance to Complete Evaluation  Min-Moderate modification of tasks or assist with assess necessary to complete eval    OT Frequency 2x / week    OT Duration 12 weeks    OT Treatment/Interventions Self-care/ADL training;Therapeutic exercise;DME and/or AE instruction;Functional Mobility Training;Balance training;Neuromuscular education;Therapeutic activities;Patient/family education    Consulted and Agree with Plan of Care Patient             Patient will benefit from skilled therapeutic intervention in order to improve the following deficits and impairments:   Body Structure / Function / Physical Skills: ADL, Coordination, Endurance, GMC, UE functional use, Balance, Sensation, Body mechanics, IADL, Pain, Dexterity, FMC, Strength, Gait, Mobility       Visit Diagnosis: Muscle weakness (generalized)  Other lack of coordination    Problem List Patient Active Problem List   Diagnosis Date Noted   Carotid stenosis, symptomatic, with infarction ([place]) 11/13/2020   Gout flare 11/10/2020   UTI (urinary tract infection) 11/10/2020   Left carotid artery stenosis 11/10/2020   Chronic kidney disease 11/10/2020   Left basal ganglia embolic stroke ([place]) [id number]   PAC (premature atrial contraction)     Pre-procedural cardiovascular examination    Ischemic stroke ([place]) 10/20/2020   TIA (transient ischemic attack) 10/19/2020   Right hemiparesis ([place]) 10/19/2020   Type 2 diabetes mellitus with stage 3 chronic kidney disease, without long-term current use of insulin ([place]) 08/24/2018   Hyperlipidemia, unspecified 07/19/2017   Hypertension 07/19/2017   PUD (peptic ulcer disease) 07/19/2017   Type 2 diabetes mellitus ([place]) 07/19/2017   Personal history of gout 08/12/2016   Anemia, unspecified 04/09/2016   Hypothyroidism, acquired 12/10/2015   Harrel Carina, MS, OTR/L   Harrel Carina, OT 06/18/2021, 11:41 AM  [place] [address] [place], [place], [address] Phone: [phone]   Fax:  [phone]  Name: Cassandra Schaefer MRN: [id number] Date of Birth: Oct 19, 1928

## 2021-06-23 ENCOUNTER — Other Ambulatory Visit: Payer: Self-pay

## 2021-06-23 ENCOUNTER — Ambulatory Visit: Payer: Medicare HMO

## 2021-06-23 ENCOUNTER — Ambulatory Visit: Payer: Medicare HMO | Admitting: Occupational Therapy

## 2021-06-23 ENCOUNTER — Encounter: Payer: Medicare HMO | Admitting: Speech Pathology

## 2021-06-23 ENCOUNTER — Encounter: Payer: Self-pay | Admitting: Occupational Therapy

## 2021-06-23 DIAGNOSIS — M6281 Muscle weakness (generalized): Secondary | ICD-10-CM

## 2021-06-23 DIAGNOSIS — R2689 Other abnormalities of gait and mobility: Secondary | ICD-10-CM

## 2021-06-23 DIAGNOSIS — R278 Other lack of coordination: Secondary | ICD-10-CM

## 2021-06-23 DIAGNOSIS — R2681 Unsteadiness on feet: Secondary | ICD-10-CM

## 2021-06-23 NOTE — Therapy (Signed)
[place] MAIN [facility] SERVICES [address] [place], [place], [address] Phone: [phone]   Fax:  [phone]  Occupational Therapy Treatment  Patient Details  Name: Cassandra Schaefer MRN: [id number] Date of Birth: 12-30-1928 Referring Provider (OT): Dr. Tracie Harrier   Encounter Date: 06/23/2021   OT End of Session - 06/23/21 1104     Visit Number 51    Number of Visits 66    Date for OT Re-Evaluation 07/14/21    Authorization Time Period Reporting period starting 06/18/2020    OT Start Time 1100    OT Stop Time 1145    OT Time Calculation (min) 45 min    Equipment Utilized During Treatment tranport chair    Activity Tolerance Patient tolerated treatment well    Behavior During Therapy WFL for tasks assessed/performed             Past Medical History:  Diagnosis Date   Anemia    Cough    RESOLVING, NO FEVER   Diabetes mellitus without complication ([place])    Gout    Hypertension    Hypothyroidism    PUD (peptic ulcer disease)    Stroke [facility])    Wears dentures    full upper, partial lower    Past Surgical History:  Procedure Laterality Date   AMPUTATION TOE Left 12/14/2017   Procedure: AMPUTATION TOE-4TH MPJ;  Surgeon: Samara Deist, DPM;  Location: [place];  Service: Podiatry;  Laterality: Left;  IVA LOCAL Diabetic - oral meds   APPENDECTOMY     CATARACT EXTRACTION W/PHACO Right 06/23/2015   Procedure: CATARACT EXTRACTION PHACO AND INTRAOCULAR LENS PLACEMENT (IOC);  Surgeon: Estill Cotta, MD;  Location: [facility];  Service: Ophthalmology;  Laterality: Right;  [place] 01:28 AP% 23.4 CDE 36.14 fluid pack lot # [id number]   CATARACT EXTRACTION W/PHACO Left 07/28/2015   Procedure: CATARACT EXTRACTION PHACO AND INTRAOCULAR LENS PLACEMENT (IOC);  Surgeon: Estill Cotta, MD;  Location: [facility];  Service: Ophthalmology;  Laterality: Left;  [place]    1:29.7 AP%  24.9 CDE   41.32 fluid casette lot [id number] H exp  09/23/2016   COONOSCOPY     AND ENDOSCOPY   DILATION AND CURETTAGE OF UTERUS     ENDARTERECTOMY Left 11/13/2020   Procedure: Evacuation of left neck hematoma;  Surgeon: Katha Cabal, MD;  Location: [facility];  Service: Vascular;  Laterality: Left;   ENDARTERECTOMY Left 11/13/2020   Procedure: ENDARTERECTOMY CAROTID;  Surgeon: Algernon Huxley, MD;  Location: [facility];  Service: Vascular;  Laterality: Left;   TONSILLECTOMY     TUBAL LIGATION      There were no vitals filed for this visit.   Subjective Assessment - 06/23/21 1103     Subjective  Pt. reports that her hands are stiff today.    Patient is accompanied by: Family member    Pertinent History R ankle pain, gout, T2DM, HTN, CKDIII; R/L carotid endarectomy    Currently in Pain? No/denies            Neuromuscular re-education:   Pt. worked on right hand [facility] skills grasping 1/2" circular tipped pegs, storing them in the palm of her hand, and performing translatory movements moving the pegs from her palm to the tip of her 2nd digit, and thumb in preparation for placing them onto a pegboard placed at the tabletop. Pt. worked on removing the pegs while using bilateral alternating hand movements.   Pt. has made progress, and is improving  with the accuracy grasping pegs from slick, flat surfaces, as well as storing them in her hand, and moving the objects through her right hand. Pt. required increased time to perform the translatory movements, and move the pegs through her hand to the tip pf her 2nd digit and thumb. Pt. tends to drop multiple pegs when moving the pegs to the tip of her right thumb. The small objects were sticking to her right hand. Pt. continues to work on improving right hand function, and [facility] skills in order to manipulate objects for ADL, and IADL tasks efficiently, and accurately.                                  OT Education - 06/23/21 1104     Education Details RUE functioning.     Person(s) Educated Patient    Methods Explanation;Verbal cues;Demonstration;Tactile cues    Comprehension Verbalized understanding;Verbal cues required;Returned demonstration              OT Short Term Goals - 03/23/21 1657       OT SHORT TERM GOAL #1   Title Pt will perform HEP for RUE strength/coordination independently.    Baseline Eval: HEP not yet established in outpatient, but pt is working with putty from [name]; 12/04/2020; [facility] HEP initiated this day with mod vc after return demo; 01/05/2021: indep with currently program, but ongoing as pt progresses; 01/28/2021: vc to revisit and focus on grip and pinch strengthening with putty; 03/23/21: pt is indep with HEP    Time 6    Period Weeks    Status Achieved    Target Date 03/23/21               OT Long Term Goals - 06/18/21 1120       OT LONG TERM GOAL #1   Title Pt will improve hand writing to 100% legibility with R hand to be able to independently write a check.    Baseline Eval: signature is 75% legible with R hand, requiring extra time; 12/04/2020: no chan to complete.ge from eval; 01/05/2021: Printing is 100% legible, signature is 90% legible, but still requires extra time and effort.; 01/26/21: Signature is 90% legible and speed/fluidity have improved. 20th visit: 90% legible, 03/23/21: 90% legible, extra time 40th visit 90 % legibility with increased time, 50th visit: 90% legible with with increased time required    Time 12    Period Weeks    Status On-going    Target Date 07/14/21      OT LONG TERM GOAL #2   Title Pt will improve R hand coordination to enable good manipulation of iphone using R dominant hand    Baseline Eval: Pt uses L hand d/t R hand lacking sufficient [place] to press buttons on phone; 12/04/2020: no change from eval; 01/05/2021: R hand coordination is improving but pt still uses L hand to dial phone; 01/28/21: Pt using R dominant hand to press buttons and apps on phone 50% of the time.20th visit: Right hand  [facility] skilols are improving, Pt. is improving with manipulation of the iphone; 03/23/21: Pt now consistently using R hand to press buttons on phone with good accuracy.40th visit: Pt. continues to have difficulty modifying the accurate amount of pressure needed to wipe the phone. 50th visit: Pt. is improving, however has difficulty modulating the amount of pressure needed for swiping the phone, as pt. swipes too hard.  Time 12    Period Weeks    Status On-going    Target Date 07/14/21      OT LONG TERM GOAL #3   Title Pt will improve GMC throughout RUE to enable pt to reach for and pick up ADL supplies with R dominant hand without dropping or knocking over objects.    Baseline Eval: Pt reports RUE is clumsy and easily knocks over objects when trying to reach for ADL supplies.  Pt verbalizes that her "aim is off."; 12/04/2020: pt reports slight improvement since eval and has started using her R hand to eat, but still feels clumsy; 01/05/2021: Greatly improved; pt is using silverware to eat with R hand, can comb hair with RUE, still mildly ataxic; 01/26/21: pt reports difficulty picking up objects within a small space (ie; may knock the toothpaste holder down when reaching for toothbrush), but reaching for light/larger objects is improving 20th visit: pt. continues to be mildly ataxic, however is consistently improving with motor control, and [place] while reaching targets; 03/23/21: Pt reaches with accuracy towards targets if she takes her time (pt drops and knocks things over when moving at a faster/normal pace) 04/23/2021: Pt. continues to present with difficulty with depth perception, and impaired [place].40th visit: Pt. is improving, however tends to knock over lighter weight objects.50th visit: Pt. has improved with reaching for items without knocking them over, however continues to need work on accuracy.    Time 12    Period Weeks    Status On-going    Target Date 07/14/21      OT LONG TERM GOAL #4   Title  Pt will improve FOTO score to 68 or better to indicate measurable functional improvement.    Baseline Eval: FOTO 55; 01/05/2021: FOTO 61; 01/28/21: FOTO 61, 20th visit: 61; 03/23/21: FOTO 60 4oth visit: FOTO score: 61, 50th visit: FOTO 69    Time 12    Period Weeks    Status On-going    Target Date 07/14/21                   Plan - 06/23/21 1104     Clinical Impression Statement Pt. has made progress, and is improving with the accuracy grasping pegs from slick, flat surfaces, as well as storing them in her hand, and moving the objects through her right hand. Pt. required increased time to perform the translatory movements, and move the pegs through her hand to the tip pf her 2nd digit and thumb. Pt. tends to drop multiple pegs when moving the pegs to the tip of her right thumb. The small objects were sticking to her right hand. Pt. continues to work on improving right hand function, and [facility] skills in order to manipulate objects for ADL, and IADL tasks efficiently, and accurately.       OT Occupational Profile and History Detailed Assessment- Review of Records and additional review of physical, cognitive, psychosocial history related to current functional performance    Occupational performance deficits (Please refer to evaluation for details): ADL's;Leisure;IADL's    Body Structure / Function / Physical Skills ADL;Coordination;Endurance;GMC;UE functional use;Balance;Sensation;Body mechanics;IADL;Pain;Dexterity;FMC;Strength;Gait;Mobility    Rehab Potential Good    Clinical Decision Making Several treatment options, min-mod task modification necessary    Comorbidities Affecting Occupational Performance: May have comorbidities impacting occupational performance    Modification or Assistance to Complete Evaluation  Min-Moderate modification of tasks or assist with assess necessary to complete eval    OT Frequency 2x / week  OT Duration 12 weeks    OT Treatment/Interventions Self-care/ADL  training;Therapeutic exercise;DME and/or AE instruction;Functional Mobility Training;Balance training;Neuromuscular education;Therapeutic activities;Patient/family education    Consulted and Agree with Plan of Care Patient             Patient will benefit from skilled therapeutic intervention in order to improve the following deficits and impairments:   Body Structure / Function / Physical Skills: ADL, Coordination, Endurance, GMC, UE functional use, Balance, Sensation, Body mechanics, IADL, Pain, Dexterity, FMC, Strength, Gait, Mobility       Visit Diagnosis: Muscle weakness (generalized)  Other lack of coordination    Problem List Patient Active Problem List   Diagnosis Date Noted   Carotid stenosis, symptomatic, with infarction ([place]) 11/13/2020   Gout flare 11/10/2020   UTI (urinary tract infection) 11/10/2020   Left carotid artery stenosis 11/10/2020   Chronic kidney disease 11/10/2020   Left basal ganglia embolic stroke ([place]) [id number]   PAC (premature atrial contraction)    Pre-procedural cardiovascular examination    Ischemic stroke ([place]) 10/20/2020   TIA (transient ischemic attack) 10/19/2020   Right hemiparesis ([place]) 10/19/2020   Type 2 diabetes mellitus with stage 3 chronic kidney disease, without long-term current use of insulin ([place]) 08/24/2018   Hyperlipidemia, unspecified 07/19/2017   Hypertension 07/19/2017   PUD (peptic ulcer disease) 07/19/2017   Type 2 diabetes mellitus ([place]) 07/19/2017   Personal history of gout 08/12/2016   Anemia, unspecified 04/09/2016   Hypothyroidism, acquired 12/10/2015   Harrel Carina, MS, OTR/L   Harrel Carina, OT 06/23/2021, 11:06 AM  [place] [address] [place], [place], [address] Phone: [phone]   Fax:  [phone]  Name: Cassandra Schaefer MRN: [id number] Date of Birth: 1929/01/22

## 2021-06-23 NOTE — Therapy (Signed)
[place] MAIN [facility] SERVICES [address] [place], [place], [address] Phone: [phone]   Fax:  [phone]  Physical Therapy Treatment  Patient Details  Name: Cassandra Schaefer MRN: [id number] Date of Birth: [birth year] Referring Provider (PT): Dr. Dew/Dr. Ginette Pitman PCP   Encounter Date: 06/23/2021   PT End of Session - 06/23/21 1242     Visit Number 63    Number of Visits 64    Date for PT Re-Evaluation 06/23/21    Authorization Type [employer]    Authorization Time Period 02/02/21-04/27/21    Progress Note Due on Visit 35    PT Start Time 1017    PT Stop Time 1058    PT Time Calculation (min) 41 min    Equipment Utilized During Treatment Gait belt    Activity Tolerance Patient tolerated treatment well    Behavior During Therapy WFL for tasks assessed/performed             Past Medical History:  Diagnosis Date   Anemia    Cough    RESOLVING, NO FEVER   Diabetes mellitus without complication ([place])    Gout    Hypertension    Hypothyroidism    PUD (peptic ulcer disease)    Stroke [facility])    Wears dentures    full upper, partial lower    Past Surgical History:  Procedure Laterality Date   AMPUTATION TOE Left 12/14/2017   Procedure: AMPUTATION TOE-4TH MPJ;  Surgeon: Samara Deist, DPM;  Location: [place];  Service: Podiatry;  Laterality: Left;  IVA LOCAL Diabetic - oral meds   APPENDECTOMY     CATARACT EXTRACTION W/PHACO Right 06/23/2015   Procedure: CATARACT EXTRACTION PHACO AND INTRAOCULAR LENS PLACEMENT (IOC);  Surgeon: Estill Cotta, MD;  Location: [facility];  Service: Ophthalmology;  Laterality: Right;  [place] 01:28 AP% 23.4 CDE 36.14 fluid pack lot # [id number]   CATARACT EXTRACTION W/PHACO Left 07/28/2015   Procedure: CATARACT EXTRACTION PHACO AND INTRAOCULAR LENS PLACEMENT (IOC);  Surgeon: Estill Cotta, MD;  Location: [facility];  Service: Ophthalmology;  Laterality: Left;  [place]    1:29.7 AP%  24.9 CDE    41.32 fluid casette lot #[id number] H exp 09/23/2016   COONOSCOPY     AND ENDOSCOPY   DILATION AND CURETTAGE OF UTERUS     ENDARTERECTOMY Left 11/13/2020   Procedure: Evacuation of left neck hematoma;  Surgeon: Katha Cabal, MD;  Location: [facility];  Service: Vascular;  Laterality: Left;   ENDARTERECTOMY Left 11/13/2020   Procedure: ENDARTERECTOMY CAROTID;  Surgeon: Algernon Huxley, MD;  Location: [facility];  Service: Vascular;  Laterality: Left;   TONSILLECTOMY     TUBAL LIGATION      There were no vitals filed for this visit.   Subjective Assessment - 06/23/21 1019     Subjective Pt reports no pain currently. Pt reports no falls/stumbles.    Patient is accompained by: --   Daughter, [name]   Pertinent History 88 y.o. female with medical history significant for type 2 diabetes mellitus, essential hypertension, acquired hypothyroidism, hyperlipidemia, stage III chronic kidney disease reports increased right sided weakness on 10/19/20. She was diagnosed with left basal ganglia ischemic stroke. She was discharged to inpatient rehab for 10 days and then discharged home. Patient was evaluated by outpatient PT on 11/12/20. CVA was due to carotid stenosis. She underwent left carotid endartectomy on 7/21. Procedure went well however patient suffered from left cervical hematoma and had subsequent surgical procedure to  stop the bleed and remove the hematoma on 11/13/20. They were concerned with her breathing and therefore intubated her for 1 day, she was in ICU for 2 days and transferred to med/surge on 11/15/20. Patient was discharged home on 11/17/20 and is now returning to outpatient PT. She has had the stiches removed and is healing well. Denies any neck pain or stiffness. She lives with her daughter and has 24/7 caregiver support. She is still using RW for most ambulation. She is able to transfer to bedside commode well and is able to stand short time unsupported. She reports feeling very weak and fatigued. She  reports she has been dragging her right foot more. She denies any numbness/tingling; She reports feeling like her legs are wanting to do more but is hesitant because of fear of falling. She is mod I for self care morning routine. She does still receive help with showering. She has been working on increasing her activity but denies any formal exercise.    Limitations Standing;Walking    How long can you sit comfortably? NA    How long can you stand comfortably? 30-60 min with support    How long can you walk comfortably? about 100 feet with RW, still limited to short distances;    Diagnostic tests MRI shows left basal ganglia infarct 10/21/20    Patient Stated Goals "Improve balance and walking and not need RW, get back to independent."    Currently in Pain? No/denies    Pain Onset In the past 7 days             INTERVENTIONS  Therex: PT instructs pt through warm-up:  Seated ankle DF 20x alternating  Seated PF 20x alternating   STS 10x; pt rates medium Leg press: BLE 30# 2x10 reps    NMR: - gait belt donned and CGA used with all upright interventions unless otherwise specified  Gait in hallway incorporating vertical, horizontal head turns to look at sticky notes/read sticky notes to PT. Pt also challenged to ambulate with diagonal head turns without reading sticky notes. Pt required up to min A due to instability. Most challenged with diagonal head turns. 1.5# weights donned each LE for additional strength challenge.   Standing beside support system:    Standing on firm surface: Forward step over 1/2 bolster with RLE only, landing on RLE heel to challenge foot clearance 20x reps, progressed to full step over with RLE leading 10x reports moderate difficulty   Side step over 1/2 foam with BLE x15 reps each direction with UUE assist on bar, cuing for increased step length as pt foot contacts foam Required CGA   Standing on 1/2 foam:(flat side up) -heel/toe rock 2x15 reps with  rail assist to challenge ankle control; -feet apart, neutral position             Unsupported standing x45 sec with finger touch support, pt required up to mod a when attempting without UE support, unable (flat side up)             Unsupported, standing with round side up x 40 sec; pt rates difficult, requires mod a without UE support and min a with finger-touch support on bar  Cone taps 15x alternating with BUE support, knocks over cones 2x    Assessment: Pt presents to session with excellent motivation. Author continued interventions with progressing number of reps as pt able. The pt continues to perform more challenging balance interventions. She required up to mod a with  static half-foam intervention, and min a with gait with head turns due to postural instability. The pt will benefit from further skilled PT to continue to improve mobility, strength, endurance and balance to increase QOL and decrease fall risk.   Pt educated throughout session about proper posture and technique with exercises. Improved exercise technique, movement at target joints, use of target muscles after min to mod verbal, visual, tactile cues.     PT Education - 06/23/21 1242     Education Details exercise technique    Person(s) Educated Patient    Methods Explanation;Demonstration;Verbal cues    Comprehension Verbalized understanding;Returned demonstration;Verbal cues required;Need further instruction              PT Short Term Goals - 04/28/21 1152       PT SHORT TERM GOAL #1   Title Patient will be adherent to HEP at least 3x a week to improve functional strength and balance for better safety at home.    Baseline 9/21: Pt reports compliance with HEP and is confident, 1/3: doing exercise 2x a week    Time 4    Period Weeks    Status Partially Met    Target Date 05/26/21      PT SHORT TERM GOAL #2   Title Patient will be independent in transferring sit<>Stand without pushing on arm rests to improve  ability to get up from chair.    Baseline 9/21: pt able to complete STS without use of UEs, 1/3:able to stand but has moderate difficulty requiring increased time;    Time 4    Period Weeks    Status Partially Met    Target Date 05/26/21               PT Long Term Goals - 04/28/21 1153       PT LONG TERM GOAL #1   Title Patient (> 62 years old) will complete five times sit to stand test in < 15 seconds indicating an increased LE strength and improved balance.    Baseline 9/21: 29 seconds hands-free 10/10: deferred 10/12: 34 sec hands free; 11/2: 23.8 sec hands-free; 12/7: 33.2 sec hands free, 1/3: 33.92 hands free    Time 8    Period Weeks    Status On-going    Target Date 06/23/21      PT LONG TERM GOAL #2   Title Patient will increase six minute walk test distance to >400 feet for improve gait ability    Baseline 9/21: 368 ft with RW; 10/10: deferred 10/12: 408 ft; 11/2: 476 ft with 4WW, 1/3: 475 feet with RW    Time 8    Period Weeks    Status Achieved    Target Date 06/23/21      PT LONG TERM GOAL #3   Title Patient will increase 10 meter walk test to >1.62ms as to improve gait speed for better community ambulation and to reduce fall risk.    Baseline 9/21: 0.5 m/s with RW; 10/10 deferred 10/12: 0.93 m/s with 45GY 11/2: 0.83 m/s with 45WL 12/7: 0.52 m/s with RW, 1/3: 0.704    Time 8    Period Weeks    Status Partially Met    Target Date 06/23/21      PT LONG TERM GOAL #4   Title Patient will be independent with ascend/descend 6 steps using single UE in step over step pattern without LOB.    Baseline 9/21:  Patient uses PT stairs (4 steps) with  bilateral upper extremity support on handrails for both ascending/descending.  Patient exhibits reciprocal pattern ascending and step to pattern descending. 10/10: deferred; 10/12: ascending/descending recip. steps with BUE support; 11/2: Ascended/descended 8 steps total, reciprocal pattern ascending and step-to descending. Pt  uses UUE support throughout with CGA assist.; 12/7: Ascended and descended 8 steps total with UUE support and CGA. Reciprocal stepping ascending and step to descending., 1/3: Deferrred    Time 8    Period Weeks    Status Partially Met    Target Date 06/23/21      PT LONG TERM GOAL #5   Title Patient will demonstrate an improved Berg Balance Score of >45/56 as to demonstrate improved balance with ADLs such as sitting/standing and transfer balance and reduced fall risk.    Baseline 9/21: deferred d/t time; 9/29: 42/56; 10/10: deferred; 10/12: 44/56; 11/2: deferred; 11/7: 46/56, 1/3: deferred    Time 8    Period Weeks    Status Achieved    Target Date 06/23/21      PT LONG TERM GOAL #6   Title Patient will improve FOTO score to >60% to indicate improved functional mobility with ADLs.    Baseline 9/21: 59; 10/10: 57; 11/2: 60; 12/7: 58%, 1/3: 61%    Time 8    Period Weeks    Status Achieved    Target Date 06/23/21                   Plan - 06/23/21 1243     Clinical Impression Statement Pt presents to session with excellent motivation. Author continued interventions with progressing number of reps as pt able. The pt continues to perform more challenging balance interventions. She required up to mod a with static half-foam intervention, and min a with gait with head turns due to postural instability. The pt will benefit from further skilled PT to continue to improve mobility, strength, endurance and balance to increase QOL and decrease fall risk.    Personal Factors and Comorbidities Age    Comorbidities HTN, CKD, fall risk, type 2 diabetes,    Examination-Activity Limitations Lift;Locomotion Level;Squat;Stairs;Stand;Toileting;Transfers;Bathing    Examination-Participation Restrictions Cleaning;Community Activity;Laundry;Meal Prep;Shop;Volunteer;Driving    Stability/Clinical Decision Making Stable/Uncomplicated    Rehab Potential Poor    PT Frequency 2x / week    PT Duration 8  weeks    PT Treatment/Interventions Cryotherapy;Electrical Stimulation;Moist Heat;Gait training;Stair training;Functional mobility training;Therapeutic activities;Therapeutic exercise;Neuromuscular re-education;Balance training;Patient/family education;Orthotic Fit/Training;Energy conservation    PT Next Visit Plan work on LE strengthening and balance; gait training, endurance, continue POC as previously indicated    PT Home Exercise Plan Access Code: [email or web address]; no updates    Consulted and Agree with Plan of Care Patient             Patient will benefit from skilled therapeutic intervention in order to improve the following deficits and impairments:  Abnormal gait, Decreased balance, Decreased endurance, Decreased mobility, Difficulty walking, Cardiopulmonary status limiting activity, Decreased activity tolerance, Decreased coordination, Decreased strength  Visit Diagnosis: Muscle weakness (generalized)  Unsteadiness on feet  Other abnormalities of gait and mobility  Other lack of coordination     Problem List Patient Active Problem List   Diagnosis Date Noted   Carotid stenosis, symptomatic, with infarction ([place]) 11/13/2020   Gout flare 11/10/2020   UTI (urinary tract infection) 11/10/2020   Left carotid artery stenosis 11/10/2020   Chronic kidney disease 11/10/2020   Left basal ganglia embolic stroke ([employer]) 64/40/3474   PAC (premature atrial contraction)  Pre-procedural cardiovascular examination    Ischemic stroke ([place]) 10/20/2020   TIA (transient ischemic attack) 10/19/2020   Right hemiparesis ([place]) 10/19/2020   Type 2 diabetes mellitus with stage 3 chronic kidney disease, without long-term current use of insulin ([place]) 08/24/2018   Hyperlipidemia, unspecified 07/19/2017   Hypertension 07/19/2017   PUD (peptic ulcer disease) 07/19/2017   Type 2 diabetes mellitus ([place]) 07/19/2017   Personal history of gout 08/12/2016   Anemia, unspecified 04/09/2016   Hypothyroidism,  acquired 12/10/2015    Zollie Pee, PT 06/23/2021, 12:49 PM  [place] MAIN [facility] SERVICES [address] [place], [place], [address] Phone: [phone]   Fax:  [phone]  Name: Monesha Monreal MRN: [id number] Date of Birth: 05-25-1928

## 2021-06-25 ENCOUNTER — Ambulatory Visit: Payer: Medicare HMO | Attending: Internal Medicine | Admitting: Occupational Therapy

## 2021-06-25 ENCOUNTER — Encounter: Payer: Medicare HMO | Admitting: Speech Pathology

## 2021-06-25 ENCOUNTER — Other Ambulatory Visit: Payer: Self-pay

## 2021-06-25 ENCOUNTER — Ambulatory Visit: Payer: Medicare HMO

## 2021-06-25 DIAGNOSIS — R269 Unspecified abnormalities of gait and mobility: Secondary | ICD-10-CM | POA: Diagnosis present

## 2021-06-25 DIAGNOSIS — R2681 Unsteadiness on feet: Secondary | ICD-10-CM | POA: Insufficient documentation

## 2021-06-25 DIAGNOSIS — R262 Difficulty in walking, not elsewhere classified: Secondary | ICD-10-CM | POA: Insufficient documentation

## 2021-06-25 DIAGNOSIS — R2689 Other abnormalities of gait and mobility: Secondary | ICD-10-CM | POA: Insufficient documentation

## 2021-06-25 DIAGNOSIS — R482 Apraxia: Secondary | ICD-10-CM | POA: Diagnosis present

## 2021-06-25 DIAGNOSIS — R278 Other lack of coordination: Secondary | ICD-10-CM

## 2021-06-25 DIAGNOSIS — M6281 Muscle weakness (generalized): Secondary | ICD-10-CM

## 2021-06-25 NOTE — Therapy (Signed)
[place] MAIN [facility] SERVICES [place], [place], [address] Phone: [phone]   Fax:  [phone]  Occupational Therapy Treatment  Patient Details  Name: Cassandra Schaefer MRN: [id number] Date of Birth: 1929-02-13 Referring Provider (OT): Dr. Tracie Harrier   Encounter Date: 06/25/2021   OT End of Session - 06/25/21 1107     Visit Number 3    Number of Visits 46    Date for OT Re-Evaluation 07/14/21    Authorization Time Period Reporting period starting 06/18/2020    OT Start Time 1100    OT Stop Time 1145    OT Time Calculation (min) 45 min    Activity Tolerance Patient tolerated treatment well    Behavior During Therapy WFL for tasks assessed/performed             Past Medical History:  Diagnosis Date   Anemia    Cough    RESOLVING, NO FEVER   Diabetes mellitus without complication ([place])    Gout    Hypertension    Hypothyroidism    PUD (peptic ulcer disease)    Stroke [facility])    Wears dentures    full upper, partial lower    Past Surgical History:  Procedure Laterality Date   AMPUTATION TOE Left 12/14/2017   Procedure: AMPUTATION TOE-4TH MPJ;  Surgeon: Samara Deist, DPM;  Location: [facility];  Service: Podiatry;  Laterality: Left;  IVA LOCAL Diabetic - oral meds   APPENDECTOMY     CATARACT EXTRACTION W/PHACO Right 06/23/2015   Procedure: CATARACT EXTRACTION PHACO AND INTRAOCULAR LENS PLACEMENT (IOC);  Surgeon: Estill Cotta, MD;  Location: [facility];  Service: Ophthalmology;  Laterality: Right;  [place] 01:28 AP% 23.4 CDE 36.14 fluid pack lot # [id number]   CATARACT EXTRACTION W/PHACO Left 07/28/2015   Procedure: CATARACT EXTRACTION PHACO AND INTRAOCULAR LENS PLACEMENT (IOC);  Surgeon: Estill Cotta, MD;  Location: [facility];  Service: Ophthalmology;  Laterality: Left;  [place]    1:29.7 AP%  24.9 CDE   41.32 fluid casette lot [id number] H exp 09/23/2016   COONOSCOPY     AND ENDOSCOPY   DILATION AND  CURETTAGE OF UTERUS     ENDARTERECTOMY Left 11/13/2020   Procedure: Evacuation of left neck hematoma;  Surgeon: Katha Cabal, MD;  Location: [facility];  Service: Vascular;  Laterality: Left;   ENDARTERECTOMY Left 11/13/2020   Procedure: ENDARTERECTOMY CAROTID;  Surgeon: Algernon Huxley, MD;  Location: [facility];  Service: Vascular;  Laterality: Left;   TONSILLECTOMY     TUBAL LIGATION      There were no vitals filed for this visit.   Subjective Assessment - 06/25/21 1106     Subjective  Pt. reports that her hands are stiff today.    Patient is accompanied by: Family member    Pertinent History R ankle pain, gout, T2DM, HTN, CKDIII; R/L carotid endarectomy           OT TREATMENT    Neuro muscular re-education:  Pt. worked on [facility] grasping 1", 3/4" and 1/2"  flat magnetic pegs, disconnecting them, and placing them on a positioned at a vertical angle at a tabletop, on an elevated angle to encourage [facility] skills with arms sustained in elevation. Pt. worked isolating her 2nd digit while sliding the the flat resistive magnets, and sorting them. Pt. worked on using her right hand lateral grasp to remove the flat magnets from the vertical board. Pt. worked on [facility] grasping 1/2" small magnetic pegs,  disconnecting them,  moving them through her right hand, and placing them onto a whiteboard positioned at a vertical angle on a tabletop, on an elevated angle to encourage [facility] skills with arms sustained in elevation. Pt. worked on removing the pegs while alternating thumb opposition to the tip of the 2nd through 5th digits.    Pt. continues to make progress with left UE functioning, and is using her right hand during more daily tasks at home. Pt. is able to reach targets with her RUE more accurately. Pt. Continues to present with limited RUE motor control, and [facility] skills. Pt. presented with difficulty performing translatory movements with the right hand. Pt. Continues to work on improving R hand function,  motor control and [facility] skills in order to improve her ability to grasp, and manipulate objects during daily tasks, and maximize overall independence with ADLs, and IADL tasks.                          OT Education - 06/25/21 1107     Education Details RUE functioning.    Person(s) Educated Patient    Methods Explanation;Verbal cues;Demonstration;Tactile cues    Comprehension Verbalized understanding;Verbal cues required;Returned demonstration              OT Short Term Goals - 03/23/21 1657       OT SHORT TERM GOAL #1   Title Pt will perform HEP for RUE strength/coordination independently.    Baseline Eval: HEP not yet established in outpatient, but pt is working with putty from [name]; 12/04/2020; [facility] HEP initiated this day with mod vc after return demo; 01/05/2021: indep with currently program, but ongoing as pt progresses; 01/28/2021: vc to revisit and focus on grip and pinch strengthening with putty; 03/23/21: pt is indep with HEP    Time 6    Period Weeks    Status Achieved    Target Date 03/23/21               OT Long Term Goals - 06/18/21 1120       OT LONG TERM GOAL #1   Title Pt will improve hand writing to 100% legibility with R hand to be able to independently write a check.    Baseline Eval: signature is 75% legible with R hand, requiring extra time; 12/04/2020: no chan to complete.ge from eval; 01/05/2021: Printing is 100% legible, signature is 90% legible, but still requires extra time and effort.; 01/26/21: Signature is 90% legible and speed/fluidity have improved. 20th visit: 90% legible, 03/23/21: 90% legible, extra time 40th visit 90 % legibility with increased time, 50th visit: 90% legible with with increased time required    Time 12    Period Weeks    Status On-going    Target Date 07/14/21      OT LONG TERM GOAL #2   Title Pt will improve R hand coordination to enable good manipulation of iphone using R dominant hand    Baseline Eval: Pt  uses L hand d/t R hand lacking sufficient [place] to press buttons on phone; 12/04/2020: no change from eval; 01/05/2021: R hand coordination is improving but pt still uses L hand to dial phone; 01/28/21: Pt using R dominant hand to press buttons and apps on phone 50% of the time.20th visit: Right hand [facility] skilols are improving, Pt. is improving with manipulation of the iphone; 03/23/21: Pt now consistently using R hand to press buttons on phone with good accuracy.40th visit:  Pt. continues to have difficulty modifying the accurate amount of pressure needed to wipe the phone. 50th visit: Pt. is improving, however has difficulty modulating the amount of pressure needed for swiping the phone, as pt. swipes too hard.    Time 12    Period Weeks    Status On-going    Target Date 07/14/21      OT LONG TERM GOAL #3   Title Pt will improve GMC throughout RUE to enable pt to reach for and pick up ADL supplies with R dominant hand without dropping or knocking over objects.    Baseline Eval: Pt reports RUE is clumsy and easily knocks over objects when trying to reach for ADL supplies.  Pt verbalizes that her "aim is off."; 12/04/2020: pt reports slight improvement since eval and has started using her R hand to eat, but still feels clumsy; 01/05/2021: Greatly improved; pt is using silverware to eat with R hand, can comb hair with RUE, still mildly ataxic; 01/26/21: pt reports difficulty picking up objects within a small space (ie; may knock the toothpaste holder down when reaching for toothbrush), but reaching for light/larger objects is improving 20th visit: pt. continues to be mildly ataxic, however is consistently improving with motor control, and [place] while reaching targets; 03/23/21: Pt reaches with accuracy towards targets if she takes her time (pt drops and knocks things over when moving at a faster/normal pace) 04/23/2021: Pt. continues to present with difficulty with depth perception, and impaired [place].40th visit: Pt. is  improving, however tends to knock over lighter weight objects.50th visit: Pt. has improved with reaching for items without knocking them over, however continues to need work on accuracy.    Time 12    Period Weeks    Status On-going    Target Date 07/14/21      OT LONG TERM GOAL #4   Title Pt will improve FOTO score to 68 or better to indicate measurable functional improvement.    Baseline Eval: FOTO 55; 01/05/2021: FOTO 61; 01/28/21: FOTO 61, 20th visit: 61; 03/23/21: FOTO 60 4oth visit: FOTO score: 61, 50th visit: FOTO 69    Time 12    Period Weeks    Status On-going    Target Date 07/14/21                   Plan - 06/25/21 1109     Clinical Impression Statement Pt. continues to make progress with left UE functioning, and is using her right hand during more daily tasks at home. Pt. is able to reach targets with her RUE more accurately. Pt. Continues to present with limited RUE motor control, and [facility] skills. Pt. presented with difficulty performing translatory movements with the right hand. Pt. Continues to work on improving R hand function, motor control and [facility] skills in order to improve her ability to grasp, and manipulate objects during daily tasks, and maximize overall independence with ADLs, and IADL tasks.       OT Occupational Profile and History Detailed Assessment- Review of Records and additional review of physical, cognitive, psychosocial history related to current functional performance    Occupational performance deficits (Please refer to evaluation for details): ADL's;Leisure;IADL's    Body Structure / Function / Physical Skills ADL;Coordination;Endurance;GMC;UE functional use;Balance;Sensation;Body mechanics;IADL;Pain;Dexterity;FMC;Strength;Gait;Mobility    Rehab Potential Good    Clinical Decision Making Several treatment options, min-mod task modification necessary    Comorbidities Affecting Occupational Performance: May have comorbidities impacting occupational  performance    Modification or  Assistance to Complete Evaluation  Min-Moderate modification of tasks or assist with assess necessary to complete eval    OT Frequency 2x / week    OT Duration 12 weeks    OT Treatment/Interventions Self-care/ADL training;Therapeutic exercise;DME and/or AE instruction;Functional Mobility Training;Balance training;Neuromuscular education;Therapeutic activities;Patient/family education    Consulted and Agree with Plan of Care Patient             Patient will benefit from skilled therapeutic intervention in order to improve the following deficits and impairments:   Body Structure / Function / Physical Skills: ADL, Coordination, Endurance, GMC, UE functional use, Balance, Sensation, Body mechanics, IADL, Pain, Dexterity, FMC, Strength, Gait, Mobility       Visit Diagnosis: Muscle weakness (generalized)  Other lack of coordination    Problem List Patient Active Problem List   Diagnosis Date Noted   Carotid stenosis, symptomatic, with infarction ([place]) 11/13/2020   Gout flare 11/10/2020   UTI (urinary tract infection) 11/10/2020   Left carotid artery stenosis 11/10/2020   Chronic kidney disease 11/10/2020   Left basal ganglia embolic stroke ([place]) [id number]   PAC (premature atrial contraction)    Pre-procedural cardiovascular examination    Ischemic stroke ([place]) 10/20/2020   TIA (transient ischemic attack) 10/19/2020   Right hemiparesis ([place]) 10/19/2020   Type 2 diabetes mellitus with stage 3 chronic kidney disease, without long-term current use of insulin ([place]) 08/24/2018   Hyperlipidemia, unspecified 07/19/2017   Hypertension 07/19/2017   PUD (peptic ulcer disease) 07/19/2017   Type 2 diabetes mellitus ([place]) 07/19/2017   Personal history of gout 08/12/2016   Anemia, unspecified 04/09/2016   Hypothyroidism, acquired 12/10/2015   Harrel Carina, MS, OTR/L   Harrel Carina, OT 06/25/2021, 11:12 AM  [facility] [address] [place], [place], [address] Phone: [phone]   Fax:  [phone]  Name: Anzleigh [facility] MRN: [id number] Date of Birth: 1928-09-13

## 2021-06-25 NOTE — Therapy (Signed)
[place] MAIN [facility] SERVICES [place], [place], [address] Phone: [phone]   Fax:  [phone]  Physical Therapy Recertification and  Physical Therapy Progress Note   Dates of reporting period   05/19/21 to   06/25/21  Patient Details  Name: Cassandra Schaefer MRN: [id number] Date of Birth: [birth year] Referring Provider (PT): Dr. Dew/Dr. Ginette Pitman PCP   Encounter Date: 06/25/2021   PT End of Session - 06/25/21 1150     Visit Number 50    Number of Visits 36    Date for PT Re-Evaluation 08/20/21    Authorization Type [employer]    Authorization Time Period --    Progress Note Due on Visit --    PT Start Time 1147    PT Stop Time 1230    PT Time Calculation (min) 43 min    Equipment Utilized During Treatment Gait belt    Activity Tolerance Patient tolerated treatment well    Behavior During Therapy WFL for tasks assessed/performed             Past Medical History:  Diagnosis Date   Anemia    Cough    RESOLVING, NO FEVER   Diabetes mellitus without complication ([place])    Gout    Hypertension    Hypothyroidism    PUD (peptic ulcer disease)    Stroke [facility])    Wears dentures    full upper, partial lower    Past Surgical History:  Procedure Laterality Date   AMPUTATION TOE Left 12/14/2017   Procedure: AMPUTATION TOE-4TH MPJ;  Surgeon: Samara Deist, DPM;  Location: [place];  Service: Podiatry;  Laterality: Left;  IVA LOCAL Diabetic - oral meds   APPENDECTOMY     CATARACT EXTRACTION W/PHACO Right 06/23/2015   Procedure: CATARACT EXTRACTION PHACO AND INTRAOCULAR LENS PLACEMENT (IOC);  Surgeon: Estill Cotta, MD;  Location: [facility] ORS;  Service: Ophthalmology;  Laterality: Right;  [place] 01:28 AP% 23.4 CDE 36.14 fluid pack lot # [id number]   CATARACT EXTRACTION W/PHACO Left 07/28/2015   Procedure: CATARACT EXTRACTION PHACO AND INTRAOCULAR LENS PLACEMENT (IOC);  Surgeon: Estill Cotta, MD;  Location: [facility]  ORS;  Service: Ophthalmology;  Laterality: Left;  [place]    1:29.7 AP%  24.9 CDE   41.32 fluid casette lot #[id number] H exp 09/23/2016   COONOSCOPY     AND ENDOSCOPY   DILATION AND CURETTAGE OF UTERUS     ENDARTERECTOMY Left 11/13/2020   Procedure: Evacuation of left neck hematoma;  Surgeon: Katha Cabal, MD;  Location: [facility] ORS;  Service: Vascular;  Laterality: Left;   ENDARTERECTOMY Left 11/13/2020   Procedure: ENDARTERECTOMY CAROTID;  Surgeon: Algernon Huxley, MD;  Location: [facility] ORS;  Service: Vascular;  Laterality: Left;   TONSILLECTOMY     TUBAL LIGATION      There were no vitals filed for this visit.   Subjective Assessment - 06/25/21 1149     Subjective Pt reports full compliance of HEP. Denies falls.    Patient is accompained by: --   Daughter, [name]   Pertinent History 88 y.o. female with medical history significant for type 2 diabetes mellitus, essential hypertension, acquired hypothyroidism, hyperlipidemia, stage III chronic kidney disease reports increased right sided weakness on 10/19/20. She was diagnosed with left basal ganglia ischemic stroke. She was discharged to inpatient rehab for 10 days and then discharged home. Patient was evaluated by outpatient PT on 11/12/20. CVA was due to carotid stenosis. She underwent left carotid endartectomy  on 7/21. Procedure went well however patient suffered from left cervical hematoma and had subsequent surgical procedure to stop the bleed and remove the hematoma on 11/13/20. They were concerned with her breathing and therefore intubated her for 1 day, she was in ICU for 2 days and transferred to med/surge on 11/15/20. Patient was discharged home on 11/17/20 and is now returning to outpatient PT. She has had the stiches removed and is healing well. Denies any neck pain or stiffness. She lives with her daughter and has 24/7 caregiver support. She is still using RW for most ambulation. She is able to transfer to bedside commode well and is able to stand  short time unsupported. She reports feeling very weak and fatigued. She reports she has been dragging her right foot more. She denies any numbness/tingling; She reports feeling like her legs are wanting to do more but is hesitant because of fear of falling. She is mod I for self care morning routine. She does still receive help with showering. She has been working on increasing her activity but denies any formal exercise.    Limitations Standing;Walking    How long can you sit comfortably? NA    How long can you stand comfortably? 30-60 min with support    How long can you walk comfortably? about 100 feet with RW, still limited to short distances;    Diagnostic tests MRI shows left basal ganglia infarct 10/21/20    Patient Stated Goals "Improve balance and walking and not need RW, get back to independent."    Currently in Pain? No/denies    Pain Onset In the past 7 days               Focus of session on reviewing short term and long term goals. Pt making steady progress towards all long term goals with accomplishing 1/2 short term goals. Addition of FGA goal written due to pt reports of imbalance with walking tasks as pt has already achieved Berg balance goal.  Please review clinical impression and goals section for details.     [facility] PT Assessment - 06/25/21 0001       Functional Gait  Assessment   Gait assessed  Yes    Gait Level Surface Walks 20 ft, slow speed, abnormal gait pattern, evidence for imbalance or deviates 10-15 in outside of the 12 in walkway width. Requires more than 7 sec to ambulate 20 ft.    Change in Gait Speed Makes only minor adjustments to walking speed, or accomplishes a change in speed with significant gait deviations, deviates 10-15 in outside the 12 in walkway width, or changes speed but loses balance but is able to recover and continue walking.    Gait with Horizontal Head Turns Performs head turns with moderate changes in gait velocity, slows down, deviates  10-15 in outside 12 in walkway width but recovers, can continue to walk.    Gait with Vertical Head Turns Performs task with moderate change in gait velocity, slows down, deviates 10-15 in outside 12 in walkway width but recovers, can continue to walk.    Gait and Pivot Turn Pivot turns safely in greater than 3 sec and stops with no loss of balance, or pivot turns safely within 3 sec and stops with mild imbalance, requires small steps to catch balance.    Step Over Obstacle Is able to step over one shoe box (4.5 in total height) but must slow down and adjust steps to clear box safely. May require  verbal cueing.    Gait with Narrow Base of Support Ambulates 7-9 steps.   with quad cane   Gait with Eyes Closed Cannot walk 20 ft without assistance, severe gait deviations or imbalance, deviates greater than 15 in outside 12 in walkway width or will not attempt task.    Ambulating Backwards Walks 20 ft, slow speed, abnormal gait pattern, evidence for imbalance, deviates 10-15 in outside 12 in walkway width.    Steps Alternating feet, must use rail.    Total Score 12                                    PT Education - 06/25/21 1150     Education Details progress towards goals    Person(s) Educated Patient    Methods Explanation;Demonstration    Comprehension Verbalized understanding              PT Short Term Goals - 06/25/21 1151       PT SHORT TERM GOAL #1   Title Patient will be adherent to HEP at least 3x a week to improve functional strength and balance for better safety at home.    Baseline 9/21: Pt reports compliance with HEP and is confident, 1/3: doing exercise 2x a week; 06/25/21: 3x/week    Time 4    Period Weeks    Status Achieved    Target Date 06/25/21      PT SHORT TERM GOAL #2   Title Patient will be independent in transferring sit<>Stand without pushing on arm rests to improve ability to get up from chair.    Baseline 9/21: pt able to complete STS  without use of UEs, 1/3:able to stand but has moderate difficulty requiring increased time; 06/25/21: able to perform indep without hands but requiring use of momentum to attain standing. Remains stable.    Time 4    Period Weeks    Status Partially Met    Target Date 07/23/21               PT Long Term Goals - 06/25/21 1154       PT LONG TERM GOAL #1   Title Patient (> 78 years old) will complete five times sit to stand test in < 15 seconds indicating an increased LE strength and improved balance.    Baseline 9/21: 29 seconds hands-free 10/10: deferred 10/12: 34 sec hands free; 11/2: 23.8 sec hands-free; 12/7: 33.2 sec hands free, 1/3: 33.92 hands free; 06/25/21: 25.69 sec    Time 8    Period Weeks    Status On-going    Target Date 08/20/21      PT LONG TERM GOAL #2   Title Patient will increase six minute walk test distance to >400 feet for improve gait ability    Baseline 9/21: 368 ft with RW; 10/10: deferred 10/12: 408 ft; 11/2: 476 ft with 4WW, 1/3: 475 feet with RW;    Time 8    Period Weeks    Status Achieved    Target Date 06/23/21      PT LONG TERM GOAL #3   Title Patient will increase 10 meter walk test to >1.66ms as to improve gait speed for better community ambulation and to reduce fall risk.    Baseline 9/21: 0.5 m/s with RW; 10/10 deferred 10/12: 0.93 m/s with 41IW 11/2: 0.83 m/s with 45YK 12/7: 0.52 m/s with RW, 1/3: 0.704;  06/25/21: 0.55 m/s with SPC.    Time 8    Period Weeks    Status On-going    Target Date 08/20/21      PT LONG TERM GOAL #4   Title Patient will be independent with ascend/descend 6 steps using single UE in step over step pattern without LOB.    Baseline 9/21:  Patient uses PT stairs (4 steps) with bilateral upper extremity support on handrails for both ascending/descending.  Patient exhibits reciprocal pattern ascending and step to pattern descending. 10/10: deferred; 10/12: ascending/descending recip. steps with BUE support; 11/2:  Ascended/descended 8 steps total, reciprocal pattern ascending and step-to descending. Pt uses UUE support throughout with CGA assist.; 12/7: Ascended and descended 8 steps total with UUE support and CGA. Reciprocal stepping ascending and step to descending., 1/3: Deferrred; 06/25/21: able to perform with SUE support and reciprocal pattern asc/desc, but does require close CGA with descending.    Time 8    Period Weeks    Status Partially Met    Target Date 08/20/21      PT LONG TERM GOAL #5   Title Patient will demonstrate an improved Berg Balance Score of >45/56 as to demonstrate improved balance with ADLs such as sitting/standing and transfer balance and reduced fall risk.    Baseline 9/21: deferred d/t time; 9/29: 42/56; 10/10: deferred; 10/12: 44/56; 11/2: deferred; 11/7: 46/56, 1/3: deferred    Time 8    Period Weeks    Status Achieved    Target Date 06/23/21      Additional Long Term Goals   Additional Long Term Goals Yes      PT LONG TERM GOAL #6   Title Patient will improve FOTO score to >60% to indicate improved functional mobility with ADLs.    Baseline 9/21: 59; 10/10: 57; 11/2: 60; 12/7: 58%, 1/3: 61%    Time 8    Period Weeks    Status Achieved    Target Date 06/23/21      PT LONG TERM GOAL #7   Title Pt will improve FGA by a minimum of 5 points to indicate clinically significant improvement in reduced risk of falls with walking tasks.    Baseline 06/25/21: 12    Time 8    Period Weeks    Status New    Target Date 08/20/21                   Plan - 06/25/21 1549     Clinical Impression Statement Progress note and PT recertification performed due to pt being at end of current POC. Pt accomplishing 1/2 short term goals and making steady progress towards long term goals. Pt improving in STS without UE support with supervision, and lower 5xSTS time without UE use indicating improved LE functional strength for ADL's and also improving gait speed with SPC. Pt improving  to reciprocal step through pattern asc/desc stairs. Pt however does require CGA to close supervision due to intermittent balance impairments. Pt will benefit from additional 8 weeks of therapy to further make progress to attempt to return pt to independence of ambulatory tasks and reduced risk of falls.    Personal Factors and Comorbidities Age    Comorbidities HTN, CKD, fall risk, type 2 diabetes,    Examination-Activity Limitations Lift;Locomotion Level;Squat;Stairs;Stand;Toileting;Transfers;Bathing    Examination-Participation Restrictions Cleaning;Community Activity;Laundry;Meal Prep;Shop;Volunteer;Driving    Stability/Clinical Decision Making Stable/Uncomplicated    Rehab Potential Fair    PT Frequency 2x / week    PT  Duration 8 weeks    PT Treatment/Interventions Cryotherapy;Electrical Stimulation;Moist Heat;Gait training;Stair training;Functional mobility training;Therapeutic activities;Therapeutic exercise;Neuromuscular re-education;Balance training;Patient/family education;Orthotic Fit/Training;Energy conservation    PT Next Visit Plan work on LE strengthening and balance; gait training, endurance, continue POC as previously indicated    PT Home Exercise Plan Access Code: [email or web address]; no updates    Consulted and Agree with Plan of Care Patient             Patient will benefit from skilled therapeutic intervention in order to improve the following deficits and impairments:  Abnormal gait, Decreased balance, Decreased endurance, Decreased mobility, Difficulty walking, Cardiopulmonary status limiting activity, Decreased activity tolerance, Decreased coordination, Decreased strength  Visit Diagnosis: Muscle weakness (generalized)  Other lack of coordination  Unsteadiness on feet  Other abnormalities of gait and mobility     Problem List Patient Active Problem List   Diagnosis Date Noted   Carotid stenosis, symptomatic, with infarction ([place]) 11/13/2020   Gout flare 11/10/2020    UTI (urinary tract infection) 11/10/2020   Left carotid artery stenosis 11/10/2020   Chronic kidney disease 11/10/2020   Left basal ganglia embolic stroke ([place]) 67/04/1001   PAC (premature atrial contraction)    Pre-procedural cardiovascular examination    Ischemic stroke ([place]) 10/20/2020   TIA (transient ischemic attack) 10/19/2020   Right hemiparesis ([place]) 10/19/2020   Type 2 diabetes mellitus with stage 3 chronic kidney disease, without long-term current use of insulin ([place]) 08/24/2018   Hyperlipidemia, unspecified 07/19/2017   Hypertension 07/19/2017   PUD (peptic ulcer disease) 07/19/2017   Type 2 diabetes mellitus ([place]) 07/19/2017   Personal history of gout 08/12/2016   Anemia, unspecified 04/09/2016   Hypothyroidism, acquired 12/10/2015    Salem Caster. Fairly IV, PT, DPT Physical Therapist- [facility]  06/25/2021, 4:01 PM  [place] MAIN [facility] SERVICES [address] [place], [place], [address] Phone: [phone]   Fax:  [phone]  Name: Cassandra Schaefer MRN: [id number] Date of Birth: 1928/08/23

## 2021-06-30 ENCOUNTER — Encounter: Payer: Self-pay | Admitting: Physical Therapy

## 2021-06-30 ENCOUNTER — Encounter: Payer: Self-pay | Admitting: Occupational Therapy

## 2021-06-30 ENCOUNTER — Ambulatory Visit: Payer: Medicare HMO | Admitting: Occupational Therapy

## 2021-06-30 ENCOUNTER — Other Ambulatory Visit: Payer: Self-pay

## 2021-06-30 ENCOUNTER — Ambulatory Visit: Payer: Medicare HMO | Admitting: Physical Therapy

## 2021-06-30 DIAGNOSIS — M6281 Muscle weakness (generalized): Secondary | ICD-10-CM

## 2021-06-30 DIAGNOSIS — R2681 Unsteadiness on feet: Secondary | ICD-10-CM

## 2021-06-30 DIAGNOSIS — R262 Difficulty in walking, not elsewhere classified: Secondary | ICD-10-CM

## 2021-06-30 DIAGNOSIS — R2689 Other abnormalities of gait and mobility: Secondary | ICD-10-CM

## 2021-06-30 DIAGNOSIS — R278 Other lack of coordination: Secondary | ICD-10-CM

## 2021-06-30 DIAGNOSIS — R269 Unspecified abnormalities of gait and mobility: Secondary | ICD-10-CM

## 2021-06-30 DIAGNOSIS — R482 Apraxia: Secondary | ICD-10-CM

## 2021-06-30 NOTE — Therapy (Signed)
[place] MAIN [facility] SERVICES [place], [place], [address] Phone: [phone]   Fax:  [phone]  Occupational Therapy Treatment  Patient Details  Name: Cassandra Schaefer MRN: [id number] Date of Birth: 07/02/1928 Referring Provider (OT): Dr. Tracie Harrier   Encounter Date: 06/30/2021   OT End of Session - 06/30/21 1510     Visit Number 21    Number of Visits 61    Date for OT Re-Evaluation 07/14/21    Authorization Time Period Reporting period starting 06/18/2020    OT Start Time 1430    OT Stop Time 1515    OT Time Calculation (min) 45 min    Activity Tolerance Patient tolerated treatment well    Behavior During Therapy WFL for tasks assessed/performed             Past Medical History:  Diagnosis Date   Anemia    Cough    RESOLVING, NO FEVER   Diabetes mellitus without complication ([place])    Gout    Hypertension    Hypothyroidism    PUD (peptic ulcer disease)    Stroke [facility])    Wears dentures    full upper, partial lower    Past Surgical History:  Procedure Laterality Date   AMPUTATION TOE Left 12/14/2017   Procedure: AMPUTATION TOE-4TH MPJ;  Surgeon: Samara Deist, DPM;  Location: [place];  Service: Podiatry;  Laterality: Left;  IVA LOCAL Diabetic - oral meds   APPENDECTOMY     CATARACT EXTRACTION W/PHACO Right 06/23/2015   Procedure: CATARACT EXTRACTION PHACO AND INTRAOCULAR LENS PLACEMENT (IOC);  Surgeon: Estill Cotta, MD;  Location: [facility];  Service: Ophthalmology;  Laterality: Right;  [place] 01:28 AP% 23.4 CDE 36.14 fluid pack lot # [id number]   CATARACT EXTRACTION W/PHACO Left 07/28/2015   Procedure: CATARACT EXTRACTION PHACO AND INTRAOCULAR LENS PLACEMENT (IOC);  Surgeon: Estill Cotta, MD;  Location: [facility];  Service: Ophthalmology;  Laterality: Left;  [place]    1:29.7 AP%  24.9 CDE   41.32 fluid casette lot [id number] H exp 09/23/2016   COONOSCOPY     AND ENDOSCOPY   DILATION AND  CURETTAGE OF UTERUS     ENDARTERECTOMY Left 11/13/2020   Procedure: Evacuation of left neck hematoma;  Surgeon: Katha Cabal, MD;  Location: [facility];  Service: Vascular;  Laterality: Left;   ENDARTERECTOMY Left 11/13/2020   Procedure: ENDARTERECTOMY CAROTID;  Surgeon: Algernon Huxley, MD;  Location: [facility];  Service: Vascular;  Laterality: Left;   TONSILLECTOMY     TUBAL LIGATION      There were no vitals filed for this visit.   Subjective Assessment - 06/30/21 1509     Subjective  Pt. reports that her hands are stiff today.    Patient is accompanied by: Family member    Pertinent History R ankle pain, gout, T2DM, HTN, CKDIII; R/L carotid endarectomy    Currently in Pain? No/denies            Neuromuscular re-education:   Pt. worked on right hand [facility] skills grasping 1/2" circular tipped pegs, storing them in the palm of her hand, and performing translatory movements moving the pegs from her palm to the tip of her 2nd digit, and thumb in preparation for placing them onto a pegboard placed at the tabletop. Pt. worked on removing the pegs while alternating thumb opposition to the tip of her 2nd-5th digits.   Pt. reports that she, and her family had a stomach bug  this past weekend. Pt. Continues to make progress, and continues to improve with the accuracy grasping pegs from slick, flat surfaces, as well as storing them in her hand, and moving the objects through her right hand. Pt. required increased time to perform the translatory movements, and move the pegs through her hand to the tip of her 2nd digit and thumb. Pt. tends to drop multiple pegs when moving the pegs to the tip of her right thumb. The small objects were sticking less to her right hand today. Pt. continues to work on improving right hand function, and [facility] skills in order to manipulate objects for ADL, and IADL tasks efficiently, and accurately.                              OT Education - 06/30/21  1510     Education Details RUE functioning.    Person(s) Educated Patient    Methods Explanation;Verbal cues;Demonstration;Tactile cues    Comprehension Verbalized understanding;Verbal cues required;Returned demonstration              OT Short Term Goals - 03/23/21 1657       OT SHORT TERM GOAL #1   Title Pt will perform HEP for RUE strength/coordination independently.    Baseline Eval: HEP not yet established in outpatient, but pt is working with putty from [name]; 12/04/2020; [facility] HEP initiated this day with mod vc after return demo; 01/05/2021: indep with currently program, but ongoing as pt progresses; 01/28/2021: vc to revisit and focus on grip and pinch strengthening with putty; 03/23/21: pt is indep with HEP    Time 6    Period Weeks    Status Achieved    Target Date 03/23/21               OT Long Term Goals - 06/18/21 1120       OT LONG TERM GOAL #1   Title Pt will improve hand writing to 100% legibility with R hand to be able to independently write a check.    Baseline Eval: signature is 75% legible with R hand, requiring extra time; 12/04/2020: no chan to complete.ge from eval; 01/05/2021: Printing is 100% legible, signature is 90% legible, but still requires extra time and effort.; 01/26/21: Signature is 90% legible and speed/fluidity have improved. 20th visit: 90% legible, 03/23/21: 90% legible, extra time 40th visit 90 % legibility with increased time, 50th visit: 90% legible with with increased time required    Time 12    Period Weeks    Status On-going    Target Date 07/14/21      OT LONG TERM GOAL #2   Title Pt will improve R hand coordination to enable good manipulation of iphone using R dominant hand    Baseline Eval: Pt uses L hand d/t R hand lacking sufficient [place] to press buttons on phone; 12/04/2020: no change from eval; 01/05/2021: R hand coordination is improving but pt still uses L hand to dial phone; 01/28/21: Pt using R dominant hand to press buttons and apps  on phone 50% of the time.20th visit: Right hand [facility] skilols are improving, Pt. is improving with manipulation of the iphone; 03/23/21: Pt now consistently using R hand to press buttons on phone with good accuracy.40th visit: Pt. continues to have difficulty modifying the accurate amount of pressure needed to wipe the phone. 50th visit: Pt. is improving, however has difficulty modulating the amount of pressure needed for  swiping the phone, as pt. swipes too hard.    Time 12    Period Weeks    Status On-going    Target Date 07/14/21      OT LONG TERM GOAL #3   Title Pt will improve GMC throughout RUE to enable pt to reach for and pick up ADL supplies with R dominant hand without dropping or knocking over objects.    Baseline Eval: Pt reports RUE is clumsy and easily knocks over objects when trying to reach for ADL supplies.  Pt verbalizes that her "aim is off."; 12/04/2020: pt reports slight improvement since eval and has started using her R hand to eat, but still feels clumsy; 01/05/2021: Greatly improved; pt is using silverware to eat with R hand, can comb hair with RUE, still mildly ataxic; 01/26/21: pt reports difficulty picking up objects within a small space (ie; may knock the toothpaste holder down when reaching for toothbrush), but reaching for light/larger objects is improving 20th visit: pt. continues to be mildly ataxic, however is consistently improving with motor control, and [place] while reaching targets; 03/23/21: Pt reaches with accuracy towards targets if she takes her time (pt drops and knocks things over when moving at a faster/normal pace) 04/23/2021: Pt. continues to present with difficulty with depth perception, and impaired [place].40th visit: Pt. is improving, however tends to knock over lighter weight objects.50th visit: Pt. has improved with reaching for items without knocking them over, however continues to need work on accuracy.    Time 12    Period Weeks    Status On-going    Target Date  07/14/21      OT LONG TERM GOAL #4   Title Pt will improve FOTO score to 68 or better to indicate measurable functional improvement.    Baseline Eval: FOTO 55; 01/05/2021: FOTO 61; 01/28/21: FOTO 61, 20th visit: 61; 03/23/21: FOTO 60 4oth visit: FOTO score: 61, 50th visit: FOTO 69    Time 12    Period Weeks    Status On-going    Target Date 07/14/21                   Plan - 06/30/21 1730     Clinical Impression Statement Pt. reports that she, and her family had a stomach bug this past weekend. Pt. Continues to make progress, and continues to improve with the accuracy grasping pegs from slick, flat surfaces, as well as storing them in her hand, and moving the objects through her right hand. Pt. required increased time to perform the translatory movements, and move the pegs through her hand to the tip of her 2nd digit and thumb. Pt. tends to drop multiple pegs when moving the pegs to the tip of her right thumb. The small objects were sticking less to her right hand today. Pt. continues to work on improving right hand function, and [facility] skills in order to manipulate objects for ADL, and IADL tasks efficiently, and accurately.    OT Occupational Profile and History Detailed Assessment- Review of Records and additional review of physical, cognitive, psychosocial history related to current functional performance    Occupational performance deficits (Please refer to evaluation for details): ADL's;Leisure;IADL's    Body Structure / Function / Physical Skills ADL;Coordination;Endurance;GMC;UE functional use;Balance;Sensation;Body mechanics;IADL;Pain;Dexterity;FMC;Strength;Gait;Mobility    Rehab Potential Good    Clinical Decision Making Several treatment options, min-mod task modification necessary    Comorbidities Affecting Occupational Performance: May have comorbidities impacting occupational performance    Modification or Assistance  to Complete Evaluation  Min-Moderate modification of tasks or  assist with assess necessary to complete eval    OT Frequency 2x / week    OT Duration 12 weeks    OT Treatment/Interventions Self-care/ADL training;Therapeutic exercise;DME and/or AE instruction;Functional Mobility Training;Balance training;Neuromuscular education;Therapeutic activities;Patient/family education    Consulted and Agree with Plan of Care Patient             Patient will benefit from skilled therapeutic intervention in order to improve the following deficits and impairments:   Body Structure / Function / Physical Skills: ADL, Coordination, Endurance, GMC, UE functional use, Balance, Sensation, Body mechanics, IADL, Pain, Dexterity, FMC, Strength, Gait, Mobility       Visit Diagnosis: Muscle weakness (generalized)  Other lack of coordination    Problem List Patient Active Problem List   Diagnosis Date Noted   Carotid stenosis, symptomatic, with infarction ([place]) 11/13/2020   Gout flare 11/10/2020   UTI (urinary tract infection) 11/10/2020   Left carotid artery stenosis 11/10/2020   Chronic kidney disease 11/10/2020   Left basal ganglia embolic stroke ([place]) [id number]   PAC (premature atrial contraction)    Pre-procedural cardiovascular examination    Ischemic stroke ([place]) 10/20/2020   TIA (transient ischemic attack) 10/19/2020   Right hemiparesis ([place]) 10/19/2020   Type 2 diabetes mellitus with stage 3 chronic kidney disease, without long-term current use of insulin ([place]) 08/24/2018   Hyperlipidemia, unspecified 07/19/2017   Hypertension 07/19/2017   PUD (peptic ulcer disease) 07/19/2017   Type 2 diabetes mellitus ([place]) 07/19/2017   Personal history of gout 08/12/2016   Anemia, unspecified 04/09/2016   Hypothyroidism, acquired 12/10/2015   Harrel Carina, MS, OTR/L   Harrel Carina, OT 06/30/2021, 5:30 PM  [place] MAIN [facility] SERVICES [address] [place], [place], [address] Phone: [phone]   Fax:   [phone]  Name: Cassandra Schaefer MRN: [id number] Date of Birth: 08-24-1928

## 2021-06-30 NOTE — Therapy (Signed)
[place] MAIN [facility] SERVICES [place], [place], [address] Phone: [phone]   Fax:  [phone]  Physical Therapy Treatment  Patient Details  Name: Cassandra Schaefer MRN: [id number] Date of Birth: [birth year] Referring Provider (PT): Dr. Dew/Dr. Ginette Pitman PCP   Encounter Date: 06/30/2021   PT End of Session - 06/30/21 1435     Visit Number 60    Number of Visits 76    Date for PT Re-Evaluation 08/20/21    Authorization Type [employer]    PT Start Time 1400    PT Stop Time 1429    PT Time Calculation (min) 29 min    Equipment Utilized During Treatment Gait belt    Activity Tolerance Patient tolerated treatment well    Behavior During Therapy WFL for tasks assessed/performed             Past Medical History:  Diagnosis Date   Anemia    Cough    RESOLVING, NO FEVER   Diabetes mellitus without complication ([place])    Gout    Hypertension    Hypothyroidism    PUD (peptic ulcer disease)    Stroke [facility])    Wears dentures    full upper, partial lower    Past Surgical History:  Procedure Laterality Date   AMPUTATION TOE Left 12/14/2017   Procedure: AMPUTATION TOE-4TH MPJ;  Surgeon: Samara Deist, DPM;  Location: [facility];  Service: Podiatry;  Laterality: Left;  IVA LOCAL Diabetic - oral meds   APPENDECTOMY     CATARACT EXTRACTION W/PHACO Right 06/23/2015   Procedure: CATARACT EXTRACTION PHACO AND INTRAOCULAR LENS PLACEMENT (IOC);  Surgeon: Estill Cotta, MD;  Location: [facility];  Service: Ophthalmology;  Laterality: Right;  [place] 01:28 AP% 23.4 CDE 36.14 fluid pack lot # [id number]   CATARACT EXTRACTION W/PHACO Left 07/28/2015   Procedure: CATARACT EXTRACTION PHACO AND INTRAOCULAR LENS PLACEMENT (IOC);  Surgeon: Estill Cotta, MD;  Location: [facility];  Service: Ophthalmology;  Laterality: Left;  [place]    1:29.7 AP%  24.9 CDE   41.32 fluid casette lot #[id number] H exp 09/23/2016   COONOSCOPY     AND ENDOSCOPY    DILATION AND CURETTAGE OF UTERUS     ENDARTERECTOMY Left 11/13/2020   Procedure: Evacuation of left neck hematoma;  Surgeon: Katha Cabal, MD;  Location: [facility];  Service: Vascular;  Laterality: Left;   ENDARTERECTOMY Left 11/13/2020   Procedure: ENDARTERECTOMY CAROTID;  Surgeon: Algernon Huxley, MD;  Location: [facility];  Service: Vascular;  Laterality: Left;   TONSILLECTOMY     TUBAL LIGATION      There were no vitals filed for this visit.   Subjective Assessment - 06/30/21 1403     Subjective Patient reports having a stomach bug over the weekend and was so weak and fatigued. She reports not doing HEP and is just now starting to feel better.    Patient is accompained by: --   Daughter, Cassandra Schaefer   Pertinent History 88 y.o. female with medical history significant for type 2 diabetes mellitus, essential hypertension, acquired hypothyroidism, hyperlipidemia, stage III chronic kidney disease reports increased right sided weakness on 10/19/20. She was diagnosed with left basal ganglia ischemic stroke. She was discharged to inpatient rehab for 10 days and then discharged home. Patient was evaluated by outpatient PT on 11/12/20. CVA was due to carotid stenosis. She underwent left carotid endartectomy on 7/21. Procedure went well however patient suffered from left cervical hematoma and had subsequent  surgical procedure to stop the bleed and remove the hematoma on 11/13/20. They were concerned with her breathing and therefore intubated her for 1 day, she was in ICU for 2 days and transferred to med/surge on 11/15/20. Patient was discharged home on 11/17/20 and is now returning to outpatient PT. She has had the stiches removed and is healing well. Denies any neck pain or stiffness. She lives with her daughter and has 24/7 caregiver support. She is still using RW for most ambulation. She is able to transfer to bedside commode well and is able to stand short time unsupported. She reports feeling very weak and  fatigued. She reports she has been dragging her right foot more. She denies any numbness/tingling; She reports feeling like her legs are wanting to do more but is hesitant because of fear of falling. She is mod I for self care morning routine. She does still receive help with showering. She has been working on increasing her activity but denies any formal exercise.    Limitations Standing;Walking    How long can you sit comfortably? NA    How long can you stand comfortably? 30-60 min with support    How long can you walk comfortably? about 100 feet with RW, still limited to short distances;    Diagnostic tests MRI shows left basal ganglia infarct 10/21/20    Patient Stated Goals "Improve balance and walking and not need RW, get back to independent."    Currently in Pain? No/denies    Pain Onset In the past 7 days                  TREATMENT:  Seated; RLE ankle DF green tband x20 reps;   Leg press: BLE 30# x10 reps with moderate difficulty reported, requiring min VCs for proper positioning and exercise technique including to move LE slowly for better motor control;    INTERVENTIONS - gait belt donned and CGA used with all upright interventions unless otherwise specified     NMR: Standing beside support system:    Standing on firm surface: Forward/backward step over orange hurdle x10 reps with 1 rail assist, min A for safety; Side step over 1/2 bolster with BLE x10 reps each direction with finger tip hold progressing to no rail assist Required Min A-CGA for safety and cues for increased step length;      Gait around cones #5 without  tripod base cane x2 sets with min A for safety and cues to increase turn, increase RLE heel strike and improve gait speed. She exhibits increased right foot drag with added difficulty;    Gait in hallway x150 feet without AD, min  A for safety with cues for increased RLE heel strike, increase step length. She was able to exhibit better reciprocal  pattern without AD with better coordination and speed, however continues to have unsteadiness due to weakness and imbalance. Consider progressing gait training without AD to facilitate better gait mobility;    Pt educated throughout session about proper posture and technique with exercises. Improved exercise technique, movement at target joints, use of target muscles after min to mod verbal, visual, tactile cues.   Patient tolerated session well. She does report increased fatigue at end of session. Patient pleased with HEP;                            PT Education - 06/30/21 1435     Education Details balance/gait safety;  Person(s) Educated Patient    Methods Explanation;Verbal cues    Comprehension Verbalized understanding;Verbal cues required;Returned demonstration;Need further instruction              PT Short Term Goals - 06/25/21 1151       PT SHORT TERM GOAL #1   Title Patient will be adherent to HEP at least 3x a week to improve functional strength and balance for better safety at home.    Baseline 9/21: Pt reports compliance with HEP and is confident, 1/3: doing exercise 2x a week; 06/25/21: 3x/week    Time 4    Period Weeks    Status Achieved    Target Date 06/25/21      PT SHORT TERM GOAL #2   Title Patient will be independent in transferring sit<>Stand without pushing on arm rests to improve ability to get up from chair.    Baseline 9/21: pt able to complete STS without use of UEs, 1/3:able to stand but has moderate difficulty requiring increased time; 06/25/21: able to perform indep without hands but requiring use of momentum to attain standing. Remains stable.    Time 4    Period Weeks    Status Partially Met    Target Date 07/23/21               PT Long Term Goals - 06/25/21 1154       PT LONG TERM GOAL #1   Title Patient (> 56 years old) will complete five times sit to stand test in < 15 seconds indicating an increased LE strength and  improved balance.    Baseline 9/21: 29 seconds hands-free 10/10: deferred 10/12: 34 sec hands free; 11/2: 23.8 sec hands-free; 12/7: 33.2 sec hands free, 1/3: 33.92 hands free; 06/25/21: 25.69 sec    Time 8    Period Weeks    Status On-going    Target Date 08/20/21      PT LONG TERM GOAL #2   Title Patient will increase six minute walk test distance to >400 feet for improve gait ability    Baseline 9/21: 368 ft with RW; 10/10: deferred 10/12: 408 ft; 11/2: 476 ft with 4WW, 1/3: 475 feet with RW;    Time 8    Period Weeks    Status Achieved    Target Date 06/23/21      PT LONG TERM GOAL #3   Title Patient will increase 10 meter walk test to >1.38ms as to improve gait speed for better community ambulation and to reduce fall risk.    Baseline 9/21: 0.5 m/s with RW; 10/10 deferred 10/12: 0.93 m/s with 49XI 11/2: 0.83 m/s with 43JA 12/7: 0.52 m/s with RW, 1/3: 0.704; 06/25/21: 0.55 m/s with SPC.    Time 8    Period Weeks    Status On-going    Target Date 08/20/21      PT LONG TERM GOAL #4   Title Patient will be independent with ascend/descend 6 steps using single UE in step over step pattern without LOB.    Baseline 9/21:  Patient uses PT stairs (4 steps) with bilateral upper extremity support on handrails for both ascending/descending.  Patient exhibits reciprocal pattern ascending and step to pattern descending. 10/10: deferred; 10/12: ascending/descending recip. steps with BUE support; 11/2: Ascended/descended 8 steps total, reciprocal pattern ascending and step-to descending. Pt uses UUE support throughout with CGA assist.; 12/7: Ascended and descended 8 steps total with UUE support and CGA. Reciprocal stepping ascending and step to  descending., 1/3: Deferrred; 06/25/21: able to perform with SUE support and reciprocal pattern asc/desc, but does require close CGA with descending.    Time 8    Period Weeks    Status Partially Met    Target Date 08/20/21      PT LONG TERM GOAL #5   Title  Patient will demonstrate an improved Berg Balance Score of >45/56 as to demonstrate improved balance with ADLs such as sitting/standing and transfer balance and reduced fall risk.    Baseline 9/21: deferred d/t time; 9/29: 42/56; 10/10: deferred; 10/12: 44/56; 11/2: deferred; 11/7: 46/56, 1/3: deferred    Time 8    Period Weeks    Status Achieved    Target Date 06/23/21      Additional Long Term Goals   Additional Long Term Goals Yes      PT LONG TERM GOAL #6   Title Patient will improve FOTO score to >60% to indicate improved functional mobility with ADLs.    Baseline 9/21: 59; 10/10: 57; 11/2: 60; 12/7: 58%, 1/3: 61%    Time 8    Period Weeks    Status Achieved    Target Date 06/23/21      PT LONG TERM GOAL #7   Title Pt will improve FGA by a minimum of 5 points to indicate clinically significant improvement in reduced risk of falls with walking tasks.    Baseline 06/25/21: 12    Time 8    Period Weeks    Status New    Target Date 08/20/21                   Plan - 06/30/21 1436     Clinical Impression Statement Patient motivated and participated well within session. She was late today due to caregiver getting stuck in traffic. She was instructed in advanced balance tasks, walking and negotiating obstacles with less UE assist. She does exhibit increased RLE foot drag with added difficulty of walking without AD. She also reports increased fatigue likely from being sick recently and not being back 100%. She does require min VCs during exercise to increase heel strike, improve weight shift for better foot clearance. She would benefit from additional skilled PT intervention to improve strength, balance and mobility;    Personal Factors and Comorbidities Age    Comorbidities HTN, CKD, fall risk, type 2 diabetes,    Examination-Activity Limitations Lift;Locomotion Level;Squat;Stairs;Stand;Toileting;Transfers;Bathing    Examination-Participation Restrictions Cleaning;Community  Activity;Laundry;Meal Prep;Shop;Volunteer;Driving    Stability/Clinical Decision Making Stable/Uncomplicated    Rehab Potential Fair    PT Frequency 2x / week    PT Duration 8 weeks    PT Treatment/Interventions Cryotherapy;Electrical Stimulation;Moist Heat;Gait training;Stair training;Functional mobility training;Therapeutic activities;Therapeutic exercise;Neuromuscular re-education;Balance training;Patient/family education;Orthotic Fit/Training;Energy conservation    PT Next Visit Plan work on LE strengthening and balance; gait training, endurance, continue POC as previously indicated    PT Home Exercise Plan Access Code: [email or web address]; no updates    Consulted and Agree with Plan of Care Patient             Patient will benefit from skilled therapeutic intervention in order to improve the following deficits and impairments:  Abnormal gait, Decreased balance, Decreased endurance, Decreased mobility, Difficulty walking, Cardiopulmonary status limiting activity, Decreased activity tolerance, Decreased coordination, Decreased strength  Visit Diagnosis: Muscle weakness (generalized)  Other lack of coordination  Unsteadiness on feet  Other abnormalities of gait and mobility  Difficulty in walking, not elsewhere classified  Abnormality of gait and mobility  Apraxia     Problem List Patient Active Problem List   Diagnosis Date Noted   Carotid stenosis, symptomatic, with infarction ([place]) 11/13/2020   Gout flare 11/10/2020   UTI (urinary tract infection) 11/10/2020   Left carotid artery stenosis 11/10/2020   Chronic kidney disease 11/10/2020   Left basal ganglia embolic stroke ([place]) 53/29/9242   PAC (premature atrial contraction)    Pre-procedural cardiovascular examination    Ischemic stroke ([place]) 10/20/2020   TIA (transient ischemic attack) 10/19/2020   Right hemiparesis ([place]) 10/19/2020   Type 2 diabetes mellitus with stage 3 chronic kidney disease, without long-term current  use of insulin ([place]) 08/24/2018   Hyperlipidemia, unspecified 07/19/2017   Hypertension 07/19/2017   PUD (peptic ulcer disease) 07/19/2017   Type 2 diabetes mellitus ([place]) 07/19/2017   Personal history of gout 08/12/2016   Anemia, unspecified 04/09/2016   Hypothyroidism, acquired 12/10/2015    Chantz Montefusco, PT, DPT 06/30/2021, 2:41 PM  [place] MAIN [facility] SERVICES [place], [place], [address] Phone: [phone]   Fax:  [phone]  Name: Cassandra Schaefer MRN: [id number] Date of Birth: 1929/01/30

## 2021-07-02 ENCOUNTER — Encounter: Payer: Self-pay | Admitting: Physical Therapy

## 2021-07-02 ENCOUNTER — Ambulatory Visit: Payer: Medicare HMO | Admitting: Physical Therapy

## 2021-07-02 ENCOUNTER — Ambulatory Visit: Payer: Medicare HMO | Admitting: Occupational Therapy

## 2021-07-02 ENCOUNTER — Other Ambulatory Visit: Payer: Self-pay

## 2021-07-02 DIAGNOSIS — R278 Other lack of coordination: Secondary | ICD-10-CM

## 2021-07-02 DIAGNOSIS — R2689 Other abnormalities of gait and mobility: Secondary | ICD-10-CM

## 2021-07-02 DIAGNOSIS — M6281 Muscle weakness (generalized): Secondary | ICD-10-CM | POA: Diagnosis not present

## 2021-07-02 DIAGNOSIS — R482 Apraxia: Secondary | ICD-10-CM

## 2021-07-02 DIAGNOSIS — R2681 Unsteadiness on feet: Secondary | ICD-10-CM

## 2021-07-02 DIAGNOSIS — R269 Unspecified abnormalities of gait and mobility: Secondary | ICD-10-CM

## 2021-07-02 DIAGNOSIS — R262 Difficulty in walking, not elsewhere classified: Secondary | ICD-10-CM

## 2021-07-02 NOTE — Therapy (Signed)
[place] MAIN [facility] SERVICES [place], [place], [address] Phone: [phone]   Fax:  [phone]  Occupational Therapy Treatment  Patient Details  Name: Cassandra Schaefer MRN: [id number] Date of Birth: [birth year] Referring Provider (OT): Dr. Tracie Harrier   Encounter Date: 07/02/2021   OT End of Session - 07/02/21 1110     Visit Number 14    Number of Visits 70    Date for OT Re-Evaluation 07/14/21    Authorization Time Period Reporting period starting 06/18/2020    OT Start Time 1100    OT Stop Time 1145    OT Time Calculation (min) 45 min    Equipment Utilized During Treatment tranport chair    Activity Tolerance Patient tolerated treatment well    Behavior During Therapy WFL for tasks assessed/performed             Past Medical History:  Diagnosis Date   Anemia    Cough    RESOLVING, NO FEVER   Diabetes mellitus without complication ([place])    Gout    Hypertension    Hypothyroidism    PUD (peptic ulcer disease)    Stroke [facility])    Wears dentures    full upper, partial lower    Past Surgical History:  Procedure Laterality Date   AMPUTATION TOE Left 12/14/2017   Procedure: AMPUTATION TOE-4TH MPJ;  Surgeon: Samara Deist, DPM;  Location: [place];  Service: Podiatry;  Laterality: Left;  IVA LOCAL Diabetic - oral meds   APPENDECTOMY     CATARACT EXTRACTION W/PHACO Right 06/23/2015   Procedure: CATARACT EXTRACTION PHACO AND INTRAOCULAR LENS PLACEMENT (IOC);  Surgeon: Estill Cotta, MD;  Location: [facility];  Service: Ophthalmology;  Laterality: Right;  [place] 01:28 AP% 23.4 CDE 36.14 fluid pack lot # [id number]   CATARACT EXTRACTION W/PHACO Left 07/28/2015   Procedure: CATARACT EXTRACTION PHACO AND INTRAOCULAR LENS PLACEMENT (IOC);  Surgeon: Estill Cotta, MD;  Location: [facility];  Service: Ophthalmology;  Laterality: Left;  [place]    1:29.7 AP%  24.9 CDE   41.32 fluid casette lot [id number] H exp 09/23/2016    COONOSCOPY     AND ENDOSCOPY   DILATION AND CURETTAGE OF UTERUS     ENDARTERECTOMY Left 11/13/2020   Procedure: Evacuation of left neck hematoma;  Surgeon: Katha Cabal, MD;  Location: [facility];  Service: Vascular;  Laterality: Left;   ENDARTERECTOMY Left 11/13/2020   Procedure: ENDARTERECTOMY CAROTID;  Surgeon: Algernon Huxley, MD;  Location: [facility];  Service: Vascular;  Laterality: Left;   TONSILLECTOMY     TUBAL LIGATION      There were no vitals filed for this visit.   Subjective Assessment - 07/02/21 1107     Subjective  Pt. reports that her hands are stiff today.    Patient is accompanied by: Family member    Pertinent History R ankle pain, gout, T2DM, HTN, CKDIII; R/L carotid endarectomy    Currently in Pain? No/denies            OT TREATMENT    Neuro muscular re-education:  Pt. performed [facility] tasks using the Grooved pegboard. Pt. worked on grasping the grooved pegs from a horizontal position, and moving the pegs to a vertical position in the hand to prepare for placing them in the grooved slot. Pt. Worked on translatory movements with the pegs, moving them through her hand from the palm to the tip of her fingers. Pt. worked on using her right  hand to grasp, and manipulate the pegs, as well as twist them with the tips of her fingers to fit in the proper direction. Pt. worked on reps of storing a peg in the ulnar aspect of her hand, while using the palmar aspect of her hand to grasp, and place pegs. Pt. Worked on removing the pegs while alternating thumb opposition to the tip of her 2nd through 5th digits.    Pt. is making progress overall, and is now engaging her right hand during more ADL, and IADL tasks. Pt. Continues to present with limited right hand function, and [facility] skills. Pt. had difficulty performing translatory movements with multiple pegs, as they drop from the ulnar, and radial aspect of her hand. Pt. was able to complete the task with less time required when  only one peg was used. Pt. Required increased time to twist the pegs between her thumb, and second digit. Pt. Had difficulty with using the radial aspect of her hand while storing a peg in the ulnar aspect of her hand. Pt. Required the use of the left hand to reposition the peg with the right hand. Pt. Continues to work on improving UE strength, and [facility] skills in order to improve right hand function, right hand [facility] skills needed for daily ADL, and IADL tasks.                    OT Education - 07/02/21 1110     Education Details RUE functioning.    Person(s) Educated Patient    Methods Explanation;Verbal cues;Demonstration;Tactile cues    Comprehension Verbalized understanding;Verbal cues required;Returned demonstration              OT Short Term Goals - 03/23/21 1657       OT SHORT TERM GOAL #1   Title Pt will perform HEP for RUE strength/coordination independently.    Baseline Eval: HEP not yet established in outpatient, but pt is working with putty from [name]; 12/04/2020; [facility] HEP initiated this day with mod vc after return demo; 01/05/2021: indep with currently program, but ongoing as pt progresses; 01/28/2021: vc to revisit and focus on grip and pinch strengthening with putty; 03/23/21: pt is indep with HEP    Time 6    Period Weeks    Status Achieved    Target Date 03/23/21               OT Long Term Goals - 06/18/21 1120       OT LONG TERM GOAL #1   Title Pt will improve hand writing to 100% legibility with R hand to be able to independently write a check.    Baseline Eval: signature is 75% legible with R hand, requiring extra time; 12/04/2020: no chan to complete.ge from eval; 01/05/2021: Printing is 100% legible, signature is 90% legible, but still requires extra time and effort.; 01/26/21: Signature is 90% legible and speed/fluidity have improved. 20th visit: 90% legible, 03/23/21: 90% legible, extra time 40th visit 90 % legibility with increased time, 50th  visit: 90% legible with with increased time required    Time 12    Period Weeks    Status On-going    Target Date 07/14/21      OT LONG TERM GOAL #2   Title Pt will improve R hand coordination to enable good manipulation of iphone using R dominant hand    Baseline Eval: Pt uses L hand d/t R hand lacking sufficient FMC to press buttons on phone; 12/04/2020:  no change from eval; 01/05/2021: R hand coordination is improving but pt still uses L hand to dial phone; 01/28/21: Pt using R dominant hand to press buttons and apps on phone 50% of the time.20th visit: Right hand [facility] skilols are improving, Pt. is improving with manipulation of the iphone; 03/23/21: Pt now consistently using R hand to press buttons on phone with good accuracy.40th visit: Pt. continues to have difficulty modifying the accurate amount of pressure needed to wipe the phone. 50th visit: Pt. is improving, however has difficulty modulating the amount of pressure needed for swiping the phone, as pt. swipes too hard.    Time 12    Period Weeks    Status On-going    Target Date 07/14/21      OT LONG TERM GOAL #3   Title Pt will improve GMC throughout RUE to enable pt to reach for and pick up ADL supplies with R dominant hand without dropping or knocking over objects.    Baseline Eval: Pt reports RUE is clumsy and easily knocks over objects when trying to reach for ADL supplies.  Pt verbalizes that her "aim is off."; 12/04/2020: pt reports slight improvement since eval and has started using her R hand to eat, but still feels clumsy; 01/05/2021: Greatly improved; pt is using silverware to eat with R hand, can comb hair with RUE, still mildly ataxic; 01/26/21: pt reports difficulty picking up objects within a small space (ie; may knock the toothpaste holder down when reaching for toothbrush), but reaching for light/larger objects is improving 20th visit: pt. continues to be mildly ataxic, however is consistently improving with motor control, and [place]  while reaching targets; 03/23/21: Pt reaches with accuracy towards targets if she takes her time (pt drops and knocks things over when moving at a faster/normal pace) 04/23/2021: Pt. continues to present with difficulty with depth perception, and impaired [place] [facility].40th visit: Pt. is improving, however tends to knock over lighter weight objects.50th visit: Pt. has improved with reaching for items without knocking them over, however continues to need work on accuracy.    Time 12    Period Weeks    Status On-going    Target Date 07/14/21      OT LONG TERM GOAL #4   Title Pt will improve FOTO score to 68 or better to indicate measurable functional improvement.    Baseline Eval: FOTO 55; 01/05/2021: FOTO 61; 01/28/21: FOTO 61, 20th visit: 61; 03/23/21: FOTO 60 4oth visit: FOTO score: 61, 50th visit: FOTO 69    Time 12    Period Weeks    Status On-going    Target Date 07/14/21                   Plan - 07/02/21 1111     Clinical Impression Statement Pt. is making progress overall, and is now engaging her right hand during more ADL, and IADL tasks. Pt. Continues to present with limited right hand function, and [facility] skills. Pt. had difficulty performing translatory movements with multiple pegs, as they drop from the ulnar, and radial aspect of her hand. Pt. was able to complete the task with less time required when only one peg was used. Pt. Required increased time to twist the pegs between her thumb, and second digit. Pt. Had difficulty with using the radial aspect of her hand while storing a peg in the ulnar aspect of her hand. Pt. Required the use of the left hand to reposition the peg with the right  hand. Pt. Continues to work on improving UE strength, and [facility] skills in order to improve right hand function, right hand [facility] skills needed for daily ADL, and IADL tasks.     OT Occupational Profile and History Detailed Assessment- Review of Records and additional review of physical, cognitive,  psychosocial history related to current functional performance    Occupational performance deficits (Please refer to evaluation for details): ADL's;Leisure;IADL's    Body Structure / Function / Physical Skills ADL;Coordination;Endurance;GMC;UE functional use;Balance;Sensation;Body mechanics;IADL;Pain;Dexterity;FMC;Strength;Gait;Mobility    Rehab Potential Good    Clinical Decision Making Several treatment options, min-mod task modification necessary    Comorbidities Affecting Occupational Performance: May have comorbidities impacting occupational performance    Modification or Assistance to Complete Evaluation  Min-Moderate modification of tasks or assist with assess necessary to complete eval    OT Frequency 2x / week    OT Duration 12 weeks    OT Treatment/Interventions Self-care/ADL training;Therapeutic exercise;DME and/or AE instruction;Functional Mobility Training;Balance training;Neuromuscular education;Therapeutic activities;Patient/family education    Consulted and Agree with Plan of Care Patient             Patient will benefit from skilled therapeutic intervention in order to improve the following deficits and impairments:   Body Structure / Function / Physical Skills: ADL, Coordination, Endurance, GMC, UE functional use, Balance, Sensation, Body mechanics, IADL, Pain, Dexterity, FMC, Strength, Gait, Mobility       Visit Diagnosis: Muscle weakness (generalized)  Other lack of coordination    Problem List Patient Active Problem List   Diagnosis Date Noted   Carotid stenosis, symptomatic, with infarction ([place]) 11/13/2020   Gout flare 11/10/2020   UTI (urinary tract infection) 11/10/2020   Left carotid artery stenosis 11/10/2020   Chronic kidney disease 11/10/2020   Left basal ganglia embolic stroke ([place]) [id number]   PAC (premature atrial contraction)    Pre-procedural cardiovascular examination    Ischemic stroke ([place]) 10/20/2020   TIA (transient ischemic attack)  10/19/2020   Right hemiparesis ([place]) 10/19/2020   Type 2 diabetes mellitus with stage 3 chronic kidney disease, without long-term current use of insulin ([place]) 08/24/2018   Hyperlipidemia, unspecified 07/19/2017   Hypertension 07/19/2017   PUD (peptic ulcer disease) 07/19/2017   Type 2 diabetes mellitus ([place]) 07/19/2017   Personal history of gout 08/12/2016   Anemia, unspecified 04/09/2016   Hypothyroidism, acquired 12/10/2015   Harrel Carina, MS, OTR/L   Harrel Carina, OT 07/02/2021, 11:13 AM  [place] [address] [place], [place], [address] Phone: [phone]   Fax:  [phone]  Name: Cassandra Schaefer MRN: [id number] Date of Birth: December 24, 1928

## 2021-07-02 NOTE — Therapy (Signed)
[place] MAIN [facility] SERVICES [place], [place], [address] Phone: [phone]   Fax:  [phone]  Physical Therapy Treatment  Patient Details  Name: Cassandra Schaefer MRN: [id number] Date of Birth: [birth year] Referring Provider (PT): Dr. Dew/Dr. Ginette Pitman PCP   Encounter Date: 07/02/2021   PT End of Session - 07/02/21 1153     Visit Number 2    Number of Visits 30    Date for PT Re-Evaluation 08/20/21    Authorization Type [employer]    PT Start Time 1148    PT Stop Time 1230    PT Time Calculation (min) 42 min    Equipment Utilized During Treatment Gait belt    Activity Tolerance Patient tolerated treatment well    Behavior During Therapy WFL for tasks assessed/performed             Past Medical History:  Diagnosis Date   Anemia    Cough    RESOLVING, NO FEVER   Diabetes mellitus without complication ([place])    Gout    Hypertension    Hypothyroidism    PUD (peptic ulcer disease)    Stroke [facility])    Wears dentures    full upper, partial lower    Past Surgical History:  Procedure Laterality Date   AMPUTATION TOE Left 12/14/2017   Procedure: AMPUTATION TOE-4TH MPJ;  Surgeon: Samara Deist, DPM;  Location: [place];  Service: Podiatry;  Laterality: Left;  IVA LOCAL Diabetic - oral meds   APPENDECTOMY     CATARACT EXTRACTION W/PHACO Right 06/23/2015   Procedure: CATARACT EXTRACTION PHACO AND INTRAOCULAR LENS PLACEMENT (IOC);  Surgeon: Estill Cotta, MD;  Location: [facility];  Service: Ophthalmology;  Laterality: Right;  [place] 01:28 AP% 23.4 CDE 36.14 fluid pack lot # [id number]   CATARACT EXTRACTION W/PHACO Left 07/28/2015   Procedure: CATARACT EXTRACTION PHACO AND INTRAOCULAR LENS PLACEMENT (IOC);  Surgeon: Estill Cotta, MD;  Location: [facility];  Service: Ophthalmology;  Laterality: Left;  [place]    1:29.7 AP%  24.9 CDE   41.32 fluid casette lot #[id number] H exp 09/23/2016   COONOSCOPY     AND ENDOSCOPY    DILATION AND CURETTAGE OF UTERUS     ENDARTERECTOMY Left 11/13/2020   Procedure: Evacuation of left neck hematoma;  Surgeon: Katha Cabal, MD;  Location: [facility];  Service: Vascular;  Laterality: Left;   ENDARTERECTOMY Left 11/13/2020   Procedure: ENDARTERECTOMY CAROTID;  Surgeon: Algernon Huxley, MD;  Location: [facility];  Service: Vascular;  Laterality: Left;   TONSILLECTOMY     TUBAL LIGATION      There were no vitals filed for this visit.   Subjective Assessment - 07/02/21 1153     Subjective Patient reports doing well. No pain currently, reports feeling better this week with improved energy;    Patient is accompained by: --   Daughter, [name]   Pertinent History 88 y.o. female with medical history significant for type 2 diabetes mellitus, essential hypertension, acquired hypothyroidism, hyperlipidemia, stage III chronic kidney disease reports increased right sided weakness on 10/19/20. She was diagnosed with left basal ganglia ischemic stroke. She was discharged to inpatient rehab for 10 days and then discharged home. Patient was evaluated by outpatient PT on 11/12/20. CVA was due to carotid stenosis. She underwent left carotid endartectomy on 7/21. Procedure went well however patient suffered from left cervical hematoma and had subsequent surgical procedure to stop the bleed and remove the hematoma on 11/13/20. They  were concerned with her breathing and therefore intubated her for 1 day, she was in ICU for 2 days and transferred to med/surge on 11/15/20. Patient was discharged home on 11/17/20 and is now returning to outpatient PT. She has had the stiches removed and is healing well. Denies any neck pain or stiffness. She lives with her daughter and has 24/7 caregiver support. She is still using RW for most ambulation. She is able to transfer to bedside commode well and is able to stand short time unsupported. She reports feeling very weak and fatigued. She reports she has been dragging her right  foot more. She denies any numbness/tingling; She reports feeling like her legs are wanting to do more but is hesitant because of fear of falling. She is mod I for self care morning routine. She does still receive help with showering. She has been working on increasing her activity but denies any formal exercise.    Limitations Standing;Walking    How long can you sit comfortably? NA    How long can you stand comfortably? 30-60 min with support    How long can you walk comfortably? about 100 feet with RW, still limited to short distances;    Diagnostic tests MRI shows left basal ganglia infarct 10/21/20    Patient Stated Goals "Improve balance and walking and not need RW, get back to independent."    Currently in Pain? No/denies    Pain Onset In the past 7 days                 TREATMENT: Warm up on Nustep BUE/BLE level 2 x4 min with cues to keep steps per minute >50, vitals assessed, HR 77, SPo2 99% following exercise;      INTERVENTIONS - gait belt donned and CGA used with all upright interventions unless otherwise specified     NMR: Standing beside support system:    Standing on firm surface: Forward/backward step over orange hurdle with RLE only for heel strike x15 reps without rail assist, min A for safety and cues to increase hip flexion for better foot clearance;  Standing in staggered stance, unsupported  Forward/backward weight shift x5-10 reps with cues for positioning and sequencing x4 reps each foot in front;  Forward/backward walking in parallel bars unsupported x3 laps with CGA walking forward and better heel strike, required min A when walking backward for safety;   Standing 1/2 bolster (flat side up) Heel/toe rock with BUE rail assist x15 reps -unsupported standing with feet in neutral 30 sec hold x2 sets with CGA to min A and cues for ankle strategies for better stance control;   Standing on firm surface: BLE heel raise unsupported x10 reps with no instability  noted;     Gait in hallway x150 feet x2 sets without AD, min  A for safety with cues for increased RLE heel strike, increase step length. She was able to exhibit better reciprocal pattern without AD with better coordination and speed, however continues to have unsteadiness due to weakness and imbalance. Consider progressing gait training without AD to facilitate better gait mobility;   Gait negotiating obstacles weaving around cones #2-3 x2 sets with min A for safety,  Stepping over 1/2 bolster x1 Stepping over small shoebox x1 rep Required min A for safety    Pt educated throughout session about proper posture and technique with exercises. Improved exercise technique, movement at target joints, use of target muscles after min to mod verbal, visual, tactile cues.  Patient tolerated session well. She does report increased fatigue at end of session. Patient pleased with HEP;                                   PT Education - 07/02/21 1153     Education Details balance/gait safety;    Person(s) Educated Patient    Methods Explanation;Verbal cues    Comprehension Verbalized understanding;Returned demonstration;Verbal cues required;Need further instruction              PT Short Term Goals - 06/25/21 1151       PT SHORT TERM GOAL #1   Title Patient will be adherent to HEP at least 3x a week to improve functional strength and balance for better safety at home.    Baseline 9/21: Pt reports compliance with HEP and is confident, 1/3: doing exercise 2x a week; 06/25/21: 3x/week    Time 4    Period Weeks    Status Achieved    Target Date 06/25/21      PT SHORT TERM GOAL #2   Title Patient will be independent in transferring sit<>Stand without pushing on arm rests to improve ability to get up from chair.    Baseline 9/21: pt able to complete STS without use of UEs, 1/3:able to stand but has moderate difficulty requiring increased time; 06/25/21: able to perform indep  without hands but requiring use of momentum to attain standing. Remains stable.    Time 4    Period Weeks    Status Partially Met    Target Date 07/23/21               PT Long Term Goals - 06/25/21 1154       PT LONG TERM GOAL #1   Title Patient (> 23 years old) will complete five times sit to stand test in < 15 seconds indicating an increased LE strength and improved balance.    Baseline 9/21: 29 seconds hands-free 10/10: deferred 10/12: 34 sec hands free; 11/2: 23.8 sec hands-free; 12/7: 33.2 sec hands free, 1/3: 33.92 hands free; 06/25/21: 25.69 sec    Time 8    Period Weeks    Status On-going    Target Date 08/20/21      PT LONG TERM GOAL #2   Title Patient will increase six minute walk test distance to >400 feet for improve gait ability    Baseline 9/21: 368 ft with RW; 10/10: deferred 10/12: 408 ft; 11/2: 476 ft with 4WW, 1/3: 475 feet with RW;    Time 8    Period Weeks    Status Achieved    Target Date 06/23/21      PT LONG TERM GOAL #3   Title Patient will increase 10 meter walk test to >1.28ms as to improve gait speed for better community ambulation and to reduce fall risk.    Baseline 9/21: 0.5 m/s with RW; 10/10 deferred 10/12: 0.93 m/s with 88YO 11/2: 0.83 m/s with 43ZC 12/7: 0.52 m/s with RW, 1/3: 0.704; 06/25/21: 0.55 m/s with SPC.    Time 8    Period Weeks    Status On-going    Target Date 08/20/21      PT LONG TERM GOAL #4   Title Patient will be independent with ascend/descend 6 steps using single UE in step over step pattern without LOB.    Baseline 9/21:  Patient uses PT stairs (4 steps)  with bilateral upper extremity support on handrails for both ascending/descending.  Patient exhibits reciprocal pattern ascending and step to pattern descending. 10/10: deferred; 10/12: ascending/descending recip. steps with BUE support; 11/2: Ascended/descended 8 steps total, reciprocal pattern ascending and step-to descending. Pt uses UUE support throughout with CGA assist.;  12/7: Ascended and descended 8 steps total with UUE support and CGA. Reciprocal stepping ascending and step to descending., 1/3: Deferrred; 06/25/21: able to perform with SUE support and reciprocal pattern asc/desc, but does require close CGA with descending.    Time 8    Period Weeks    Status Partially Met    Target Date 08/20/21      PT LONG TERM GOAL #5   Title Patient will demonstrate an improved Berg Balance Score of >45/56 as to demonstrate improved balance with ADLs such as sitting/standing and transfer balance and reduced fall risk.    Baseline 9/21: deferred d/t time; 9/29: 42/56; 10/10: deferred; 10/12: 44/56; 11/2: deferred; 11/7: 46/56, 1/3: deferred    Time 8    Period Weeks    Status Achieved    Target Date 06/23/21      Additional Long Term Goals   Additional Long Term Goals Yes      PT LONG TERM GOAL #6   Title Patient will improve FOTO score to >60% to indicate improved functional mobility with ADLs.    Baseline 9/21: 59; 10/10: 57; 11/2: 60; 12/7: 58%, 1/3: 61%    Time 8    Period Weeks    Status Achieved    Target Date 06/23/21      PT LONG TERM GOAL #7   Title Pt will improve FGA by a minimum of 5 points to indicate clinically significant improvement in reduced risk of falls with walking tasks.    Baseline 06/25/21: 12    Time 8    Period Weeks    Status New    Target Date 08/20/21                   Plan - 07/02/21 1254     Clinical Impression Statement Patient motivated and participated well within session. She was instructed in advanced balance tasks. 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 Patient does continues to have right foot drag with added gait challenge such as weaving/negotiating obstacles or turning. She is able to exhibit better heel strike and reciprocal gait pattern with less assistance (CGA) when walking straight. Patient was able to exhibit better ankle strategies while  standing on 1/2 bolster although still requires min A for safety/balance recovery. She would benefit from additional skilled PT Intervention to improve strength, balance and mobility;    Personal Factors and Comorbidities Age    Comorbidities HTN, CKD, fall risk, type 2 diabetes,    Examination-Activity Limitations Lift;Locomotion Level;Squat;Stairs;Stand;Toileting;Transfers;Bathing    Examination-Participation Restrictions Cleaning;Community Activity;Laundry;Meal Prep;Shop;Volunteer;Driving    Stability/Clinical Decision Making Stable/Uncomplicated    Rehab Potential Fair    PT Frequency 2x / week    PT Duration 8 weeks    PT Treatment/Interventions Cryotherapy;Electrical Stimulation;Moist Heat;Gait training;Stair training;Functional mobility training;Therapeutic activities;Therapeutic exercise;Neuromuscular re-education;Balance training;Patient/family education;Orthotic Fit/Training;Energy conservation    PT Next Visit Plan work on LE strengthening and balance; gait training, endurance, continue POC as previously indicated    PT Home Exercise Plan Access Code: [email or web address]; no updates    Consulted and Agree with Plan of Care Patient  Patient will benefit from skilled therapeutic intervention in order to improve the following deficits and impairments:  Abnormal gait, Decreased balance, Decreased endurance, Decreased mobility, Difficulty walking, Cardiopulmonary status limiting activity, Decreased activity tolerance, Decreased coordination, Decreased strength  Visit Diagnosis: Muscle weakness (generalized)  Other lack of coordination  Unsteadiness on feet  Other abnormalities of gait and mobility  Difficulty in walking, not elsewhere classified  Abnormality of gait and mobility  Apraxia     Problem List Patient Active Problem List   Diagnosis Date Noted   Carotid stenosis, symptomatic, with infarction ([place]) 11/13/2020   Gout flare 11/10/2020   UTI (urinary tract  infection) 11/10/2020   Left carotid artery stenosis 11/10/2020   Chronic kidney disease 11/10/2020   Left basal ganglia embolic stroke ([place]) 81/85/9093   PAC (premature atrial contraction)    Pre-procedural cardiovascular examination    Ischemic stroke ([place]) 10/20/2020   TIA (transient ischemic attack) 10/19/2020   Right hemiparesis ([place]) 10/19/2020   Type 2 diabetes mellitus with stage 3 chronic kidney disease, without long-term current use of insulin ([place]) 08/24/2018   Hyperlipidemia, unspecified 07/19/2017   Hypertension 07/19/2017   PUD (peptic ulcer disease) 07/19/2017   Type 2 diabetes mellitus ([place]) 07/19/2017   Personal history of gout 08/12/2016   Anemia, unspecified 04/09/2016   Hypothyroidism, acquired 12/10/2015    Cassandra Schaefer, PT, DPT 07/02/2021, 12:58 PM  [place] MAIN [facility] SERVICES [address] [place], [place], [address] Phone: [phone]   Fax:  [phone]  Name: Cassandra Schaefer MRN: [id number] Date of Birth: 09-21-1928

## 2021-07-07 ENCOUNTER — Ambulatory Visit: Payer: Medicare HMO | Admitting: Occupational Therapy

## 2021-07-07 ENCOUNTER — Encounter: Payer: Self-pay | Admitting: Occupational Therapy

## 2021-07-07 ENCOUNTER — Ambulatory Visit: Payer: Medicare HMO | Admitting: Physical Therapy

## 2021-07-07 ENCOUNTER — Other Ambulatory Visit: Payer: Self-pay

## 2021-07-07 ENCOUNTER — Encounter: Payer: Self-pay | Admitting: Physical Therapy

## 2021-07-07 DIAGNOSIS — M6281 Muscle weakness (generalized): Secondary | ICD-10-CM | POA: Diagnosis not present

## 2021-07-07 DIAGNOSIS — R262 Difficulty in walking, not elsewhere classified: Secondary | ICD-10-CM

## 2021-07-07 DIAGNOSIS — R278 Other lack of coordination: Secondary | ICD-10-CM

## 2021-07-07 DIAGNOSIS — R2681 Unsteadiness on feet: Secondary | ICD-10-CM

## 2021-07-07 DIAGNOSIS — R269 Unspecified abnormalities of gait and mobility: Secondary | ICD-10-CM

## 2021-07-07 DIAGNOSIS — R2689 Other abnormalities of gait and mobility: Secondary | ICD-10-CM

## 2021-07-07 DIAGNOSIS — R482 Apraxia: Secondary | ICD-10-CM

## 2021-07-07 NOTE — Therapy (Signed)
[place] ?[facility] MAIN REHAB SERVICES ?[address] ?[place], [place], [address] ?Phone: [phone]   Fax:  [phone] ? ?Occupational Therapy Treatment ? ?Patient Details  ?Name: Cassandra Schaefer ?MRN: [id number] ?Date of Birth: 10-Nov-1928 ?Referring Provider (OT): Dr. Barbette Reichmann ? ? ?Encounter Date: 07/07/2021 ? ? OT End of Session - 07/07/21 1309   ? ? Visit Number 55   ? Number of Visits 90   ? Date for OT Re-Evaluation 07/14/21   ? Authorization Time Period Reporting period starting 06/18/2020   ? OT Start Time 1300   ? OT Stop Time 1345   ? OT Time Calculation (min) 45 min   ? Activity Tolerance Patient tolerated treatment well   ? Behavior During Therapy [facility] for tasks assessed/performed   ? ?  ?  ? ?  ? ? ?Past Medical History:  ?Diagnosis Date  ? Anemia   ? Cough   ? RESOLVING, NO FEVER  ? Diabetes mellitus without complication ([facility])   ? Gout   ? Hypertension   ? Hypothyroidism   ? PUD (peptic ulcer disease)   ? Stroke [facility])   ? Wears dentures   ? full upper, partial lower  ? ? ?Past Surgical History:  ?Procedure Laterality Date  ? AMPUTATION TOE Left 12/14/2017  ? Procedure: AMPUTATION TOE-4TH MPJ;  Surgeon: Gwyneth Revels, DPM;  Location: [facility];  Service: Podiatry;  Laterality: Left;  IVA LOCAL ?Diabetic - oral meds  ? APPENDECTOMY    ? CATARACT EXTRACTION W/PHACO Right 06/23/2015  ? Procedure: CATARACT EXTRACTION PHACO AND INTRAOCULAR LENS PLACEMENT (IOC);  Surgeon: Sallee Lange, MD;  Location: [facility];  Service: Ophthalmology;  Laterality: Right;  [place] 01:28 ?AP% 23.4 ?CDE 36.14 ?fluid pack lot # [id number]  ? CATARACT EXTRACTION W/PHACO Left 07/28/2015  ? Procedure: CATARACT EXTRACTION PHACO AND INTRAOCULAR LENS PLACEMENT (IOC);  Surgeon: Sallee Lange, MD;  Location: [facility];  Service: Ophthalmology;  Laterality: Left;  [place]    1:29.7 ?AP%  24.9 ?CDE   41.32 ?fluid casette lot #[id number] H exp 09/23/2016  ? COONOSCOPY    ? AND ENDOSCOPY  ? DILATION AND  CURETTAGE OF UTERUS    ? ENDARTERECTOMY Left 11/13/2020  ? Procedure: Evacuation of left neck hematoma;  Surgeon: Renford Dills, MD;  Location: [facility];  Service: Vascular;  Laterality: Left;  ? ENDARTERECTOMY Left 11/13/2020  ? Procedure: ENDARTERECTOMY CAROTID;  Surgeon: Annice Needy, MD;  Location: [facility];  Service: Vascular;  Laterality: Left;  ? TONSILLECTOMY    ? TUBAL LIGATION    ? ? ?There were no vitals filed for this visit. ? ? Subjective Assessment - 07/07/21 1308   ? ? Subjective  Pt. reports being cold today.   ? Patient is accompanied by: Family member   ? Pertinent History R ankle pain, gout, T2DM, HTN, CKDIII; R/L carotid endarectomy   ? Currently in Pain? No/denies   ? ?  ?  ? ?  ?OT TREATMENT   ? ?Neuro muscular re-education: ? ?Pt. worked on right hand [facility] skills grasping, and lining up dominoes horizontally at the tabletop. Pt. worked on Copy, and fine motor coordination skills needed to turn the dominoes from a horizontal to a vertical position. Pt. worked on translatory movements moving the dominoes through her hand changing the position of the dominoes. Pt. worked on [employer] one on top of another. Pt. worked on storing the dominoes back into the container while grasping, and storing  them. ? ?Pt. reports no pain today. Pt. has made progress with RUE functioning, and is engaging her hand more efficiently during tasks at home. Pt. does present with impaired motor control, and [facility] skills. Pt. presented with more difficulty with translatory movements, and manipulating the dominoes lined up further away from her when her arm was extended out in front of her. Pt. was able to stack 12 dominoes. Pt. continues to work on improving RUE strength, and [facility] skills in order to work towards improving, and maximizing independence with ADLs, and IADLs. ? ? ? ? ? ? ? ? ? ? ? ? ? ? ? ? ? ? ? ? ? OT Education - 07/07/21 1308   ? ? Education Details RUE functioning.   ? Methods  Explanation;Verbal cues;Demonstration;Tactile cues   ? Comprehension Verbalized understanding;Verbal cues required;Returned demonstration   ? ?  ?  ? ?  ? ? ? OT Short Term Goals - 03/23/21 1657   ? ?  ? OT SHORT TERM GOAL #1  ? Title Pt will perform HEP for RUE strength/coordination independently.   ? Baseline Eval: HEP not yet established in outpatient, but pt is working with putty from [name]; 12/04/2020; [facility] HEP initiated this day with mod vc after return demo; 01/05/2021: indep with currently program, but ongoing as pt progresses; 01/28/2021: vc to revisit and focus on grip and pinch strengthening with putty; 03/23/21: pt is indep with HEP   ? Time 6   ? Period Weeks   ? Status Achieved   ? Target Date 03/23/21   ? ?  ?  ? ?  ? ? ? ? OT Long Term Goals - 06/18/21 1120   ? ?  ? OT LONG TERM GOAL #1  ? Title Pt will improve hand writing to 100% legibility with R hand to be able to independently write a check.   ? Baseline Eval: signature is 75% legible with R hand, requiring extra time; 12/04/2020: no chan to complete.ge from eval; 01/05/2021: Printing is 100% legible, signature is 90% legible, but still requires extra time and effort.; 01/26/21: Signature is 90% legible and speed/fluidity have improved. 20th visit: 90% legible, 03/23/21: 90% legible, extra time 40th visit 90 % legibility with increased time, 50th visit: 90% legible with with increased time required   ? Time 12   ? Period Weeks   ? Status On-going   ? Target Date 07/14/21   ?  ? OT LONG TERM GOAL #2  ? Title Pt will improve R hand coordination to enable good manipulation of iphone using R dominant hand   ? Baseline Eval: Pt uses L hand d/t R hand lacking sufficient FMC to press buttons on phone; 12/04/2020: no change from eval; 01/05/2021: R hand coordination is improving but pt still uses L hand to dial phone; 01/28/21: Pt using R dominant hand to press buttons and apps on phone 50% of the time.20th visit: Right hand [facility] skilols are improving, Pt. is  improving with manipulation of the iphone; 03/23/21: Pt now consistently using R hand to press buttons on phone with good accuracy.40th visit: Pt. continues to have difficulty modifying the accurate amount of pressure needed to wipe the phone. 50th visit: Pt. is improving, however has difficulty modulating the amount of pressure needed for swiping the phone, as pt. swipes too hard.   ? Time 12   ? Period Weeks   ? Status On-going   ? Target Date 07/14/21   ?  ? OT  LONG TERM GOAL #3  ? Title Pt will improve GMC throughout RUE to enable pt to reach for and pick up ADL supplies with R dominant hand without dropping or knocking over objects.   ? Baseline Eval: Pt reports RUE is clumsy and easily knocks over objects when trying to reach for ADL supplies.  Pt verbalizes that her "aim is off."; 12/04/2020: pt reports slight improvement since eval and has started using her R hand to eat, but still feels clumsy; 01/05/2021: Greatly improved; pt is using silverware to eat with R hand, can comb hair with RUE, still mildly ataxic; 01/26/21: pt reports difficulty picking up objects within a small space (ie; may knock the toothpaste holder down when reaching for toothbrush), but reaching for light/larger objects is improving 20th visit: pt. continues to be mildly ataxic, however is consistently improving with motor control, and FMC while reaching targets; 03/23/21: Pt reaches with accuracy towards targets if she takes her time (pt drops and knocks things over when moving at a faster/normal pace) 04/23/2021: Pt. continues to present with difficulty with depth perception, and impaired FMC.40th visit: Pt. is improving, however tends to knock over lighter weight objects.50th visit: Pt. has improved with reaching for items without knocking them over, however continues to need work on accuracy.   ? Time 12   ? Period Weeks   ? Status On-going   ? Target Date 07/14/21   ?  ? OT LONG TERM GOAL #4  ? Title Pt will improve FOTO score to 68 or  better to indicate measurable functional improvement.   ? Baseline Eval: FOTO 55; 01/05/2021: FOTO 61; 01/28/21: FOTO 61, 20th visit: 61; 03/23/21: FOTO 60 4oth visit: FOTO score: 61, 50th visit: FOTO 69   ? Time 12   ?

## 2021-07-07 NOTE — Therapy (Signed)
[facility] ?[place], [place], [address] ?Phone: [phone]   Fax:  [phone] ? ?Physical Therapy Treatment ? ?Patient Details  ?Name: Ezinne Yogi Goyer ?MRN: [id number] ?Date of Birth: [birth year] ?Referring Provider (PT): Dr. Dew/Dr. Ginette Pitman PCP ? ? ?Encounter Date: 07/07/2021 ? ? PT End of Session - 07/07/21 2122   ? ? Visit Number 24   ? Number of Visits 70   ? Date for PT Re-Evaluation 08/20/21   ? Authorization Type [employer] Medicare   ? PT Start Time 1350   ? PT Stop Time 4401   ? PT Time Calculation (min) 42 min   ? Equipment Utilized During Treatment Gait belt   ? Activity Tolerance Patient tolerated treatment well   ? Behavior During Therapy [facility] for tasks assessed/performed   ? ?  ?  ? ?  ? ? ?Past Medical History:  ?Diagnosis Date  ? Anemia   ? Cough   ? RESOLVING, NO FEVER  ? Diabetes mellitus without complication ([place])   ? Gout   ? Hypertension   ? Hypothyroidism   ? PUD (peptic ulcer disease)   ? Stroke [facility])   ? Wears dentures   ? full upper, partial lower  ? ? ?Past Surgical History:  ?Procedure Laterality Date  ? AMPUTATION TOE Left 12/14/2017  ? Procedure: AMPUTATION TOE-4TH MPJ;  Surgeon: Samara Deist, DPM;  Location: [place];  Service: Podiatry;  Laterality: Left;  IVA LOCAL ?Diabetic - oral meds  ? APPENDECTOMY    ? CATARACT EXTRACTION W/PHACO Right 06/23/2015  ? Procedure: CATARACT EXTRACTION PHACO AND INTRAOCULAR LENS PLACEMENT (IOC);  Surgeon: Estill Cotta, MD;  Location: [facility];  Service: Ophthalmology;  Laterality: Right;  [place] 01:28 ?AP% 23.4 ?CDE 36.14 ?fluid pack lot # [id number]  ? CATARACT EXTRACTION W/PHACO Left 07/28/2015  ? Procedure: CATARACT EXTRACTION PHACO AND INTRAOCULAR LENS PLACEMENT (IOC);  Surgeon: Estill Cotta, MD;  Location: [facility];  Service: Ophthalmology;  Laterality: Left;  [place]    1:29.7 ?AP%  24.9 ?CDE   41.32 ?fluid casette lot #[id number] H exp 09/23/2016  ? COONOSCOPY    ? AND ENDOSCOPY   ? DILATION AND CURETTAGE OF UTERUS    ? ENDARTERECTOMY Left 11/13/2020  ? Procedure: Evacuation of left neck hematoma;  Surgeon: Katha Cabal, MD;  Location: [facility];  Service: Vascular;  Laterality: Left;  ? ENDARTERECTOMY Left 11/13/2020  ? Procedure: ENDARTERECTOMY CAROTID;  Surgeon: Algernon Huxley, MD;  Location: [facility];  Service: Vascular;  Laterality: Left;  ? TONSILLECTOMY    ? TUBAL LIGATION    ? ? ?There were no vitals filed for this visit. ? ? Subjective Assessment - 07/07/21 2121   ? ? Subjective Patient reports doing well. She denies any pain. She reports minimal fatigue. She reports adherence with HEP with good tolerance. She reports her HEP is still challenging;   ? Patient is accompained by: --   Daughter, [name]  ? Pertinent History 88 y.o. female with medical history significant for type 2 diabetes mellitus, essential hypertension, acquired hypothyroidism, hyperlipidemia, stage III chronic kidney disease reports increased right sided weakness on 10/19/20. She was diagnosed with left basal ganglia ischemic stroke. She was discharged to inpatient rehab for 10 days and then discharged home. Patient was evaluated by outpatient PT on 11/12/20. CVA was due to carotid stenosis. She underwent left carotid endartectomy on 7/21. Procedure went well however patient suffered from left cervical hematoma and had subsequent surgical  procedure to stop the bleed and remove the hematoma on 11/13/20. They were concerned with her breathing and therefore intubated her for 1 day, she was in ICU for 2 days and transferred to med/surge on 11/15/20. Patient was discharged home on 11/17/20 and is now returning to outpatient PT. She has had the stiches removed and is healing well. Denies any neck pain or stiffness. She lives with her daughter and has 24/7 caregiver support. She is still using RW for most ambulation. She is able to transfer to bedside commode well and is able to stand short time unsupported. She reports  feeling very weak and fatigued. She reports she has been dragging her right foot more. She denies any numbness/tingling; She reports feeling like her legs are wanting to do more but is hesitant because of fear of falling. She is mod I for self care morning routine. She does still receive help with showering. She has been working on increasing her activity but denies any formal exercise.   ? Limitations Standing;Walking   ? How long can you sit comfortably? NA   ? How long can you stand comfortably? 30-60 min with support   ? How long can you walk comfortably? about 100 feet with RW, still limited to short distances;   ? Diagnostic tests MRI shows left basal ganglia infarct 10/21/20   ? Patient Stated Goals "Improve balance and walking and not need RW, get back to independent."   ? Currently in Pain? No/denies   ? Pain Onset In the past 7 days   ? ?  ?  ? ?  ? ? ? ? ?TREATMENT: ?Standing in parallel bars: ?Forward walking over ladder, reciprocal steps x6 laps, required cues for increased step length and improve heel strike for better foot clearance; Patient does require intermittent rail assist with increased instability with less rail assist, requiring CGA for safety;  ?Side stepping over ladder x1 lap each direction with 1 rail assist; ? ?Airex beam: ?-side stepping x2 laps each direction ?-feet apart, reaching side/side outside base of support x5 reps each ?-tandem stance 15 sec hold x2 reps each foot in front, patient does have increased difficulty when left foot is ahead of right foot due to RLE weakness;  ? ?Standing 1/2 bolster (flat side up) ?Heel/toe rock with BUE rail assist x15 reps ?-unsupported standing with feet in neutral 30 sec hold x2 sets with CGA to min A and cues for ankle strategies for better stance control;  ?-mini squat x10 reps with BUE rail assist;  ?  ?   ?Gait in hallway x150 feet x1 sets without AD, min  A for safety with cues for increased RLE heel strike, increase step length. She was  able to exhibit better reciprocal pattern without AD with better coordination and speed, however continues to have unsteadiness due to weakness and imbalance. Consider progressing gait training without AD to facilitate better gait mobility;  ?  ?Gait negotiating obstacles weaving around cones #6 x1 sets with min A for safety,  ? ?Standing in parallel bars:  ?Stepping over 1/2 bolster with RLE only, heel strike x15 reps with 2-1 rail assist and cues for weight shift;  ? ?Pt educated throughout session about proper posture and technique with exercises. Improved exercise technique, movement at target joints, use of target muscles after min to mod verbal, visual, tactile cues. ?  ?Patient tolerated session well. She does report increased fatigue at end of session. Reinforced HEP ? ? ? ? ? ? ? ? ? ? ? ? ? ? ? ? ? ? ? ? ?  PT Education - 07/07/21 2122   ? ? Education Details balance/gait safety, HEP   ? Person(s) Educated Patient   ? Methods Explanation;Verbal cues   ? Comprehension Verbalized understanding;Returned demonstration;Verbal cues required;Need further instruction   ? ?  ?  ? ?  ? ? ? PT Short Term Goals - 06/25/21 1151   ? ?  ? PT SHORT TERM GOAL #1  ? Title Patient will be adherent to HEP at least 3x a week to improve functional strength and balance for better safety at home.   ? Baseline 9/21: Pt reports compliance with HEP and is confident, 1/3: doing exercise 2x a week; 06/25/21: 3x/week   ? Time 4   ? Period Weeks   ? Status Achieved   ? Target Date 06/25/21   ?  ? PT SHORT TERM GOAL #2  ? Title Patient will be independent in transferring sit<>Stand without pushing on arm rests to improve ability to get up from chair.   ? Baseline 9/21: pt able to complete STS without use of UEs, 1/3:able to stand but has moderate difficulty requiring increased time; 06/25/21: able to perform indep without hands but requiring use of momentum to attain standing. Remains stable.   ? Time 4   ? Period Weeks   ? Status Partially  Met   ? Target Date 07/23/21   ? ?  ?  ? ?  ? ? ? ? PT Long Term Goals - 06/25/21 1154   ? ?  ? PT LONG TERM GOAL #1  ? Title Patient (> 34 years old) will complete five times sit to stand test in < 15 seconds

## 2021-07-09 ENCOUNTER — Ambulatory Visit: Payer: Medicare HMO | Admitting: Occupational Therapy

## 2021-07-09 ENCOUNTER — Other Ambulatory Visit: Payer: Self-pay

## 2021-07-09 ENCOUNTER — Encounter: Payer: Self-pay | Admitting: Occupational Therapy

## 2021-07-09 ENCOUNTER — Encounter: Payer: Self-pay | Admitting: Physical Therapy

## 2021-07-09 ENCOUNTER — Ambulatory Visit: Payer: Medicare HMO | Admitting: Physical Therapy

## 2021-07-09 DIAGNOSIS — M6281 Muscle weakness (generalized): Secondary | ICD-10-CM | POA: Diagnosis not present

## 2021-07-09 NOTE — Therapy (Signed)
[place] [facility] ?[place], [place], [address] ?Phone: [phone]   Fax:  [phone] ? ?Physical Therapy Treatment ? ?Patient Details  ?Name: Cassandra Schaefer ?MRN: [id number] ?Date of Birth: [birth year] ?Referring Provider (PT): Dr. Dew/Dr. Ginette Pitman PCP ? ? ?Encounter Date: 07/09/2021 ? ? PT End of Session - 07/09/21 1152   ? ? Visit Number 39   ? Number of Visits 70   ? Date for PT Re-Evaluation 08/20/21   ? Authorization Type [employer] Medicare   ? PT Start Time 1146   ? PT Stop Time 1230   ? PT Time Calculation (min) 44 min   ? Equipment Utilized During Treatment Gait belt   ? Activity Tolerance Patient tolerated treatment well   ? Behavior During Therapy [facility] for tasks assessed/performed   ? ?  ?  ? ?  ? ? ?Past Medical History:  ?Diagnosis Date  ? Anemia   ? Cough   ? RESOLVING, NO FEVER  ? Diabetes mellitus without complication ([place])   ? Gout   ? Hypertension   ? Hypothyroidism   ? PUD (peptic ulcer disease)   ? Stroke [facility])   ? Wears dentures   ? full upper, partial lower  ? ? ?Past Surgical History:  ?Procedure Laterality Date  ? AMPUTATION TOE Left 12/14/2017  ? Procedure: AMPUTATION TOE-4TH MPJ;  Surgeon: Samara Deist, DPM;  Location: [place];  Service: Podiatry;  Laterality: Left;  IVA LOCAL ?Diabetic - oral meds  ? APPENDECTOMY    ? CATARACT EXTRACTION W/PHACO Right 06/23/2015  ? Procedure: CATARACT EXTRACTION PHACO AND INTRAOCULAR LENS PLACEMENT (IOC);  Surgeon: Estill Cotta, MD;  Location: [facility];  Service: Ophthalmology;  Laterality: Right;  [place] 01:28 ?AP% 23.4 ?CDE 36.14 ?fluid pack lot # [id number]  ? CATARACT EXTRACTION W/PHACO Left 07/28/2015  ? Procedure: CATARACT EXTRACTION PHACO AND INTRAOCULAR LENS PLACEMENT (IOC);  Surgeon: Estill Cotta, MD;  Location: [facility];  Service: Ophthalmology;  Laterality: Left;  [place]    1:29.7 ?AP%  24.9 ?CDE   41.32 ?fluid casette lot #[id number] H exp 09/23/2016  ? COONOSCOPY    ? AND ENDOSCOPY   ? DILATION AND CURETTAGE OF UTERUS    ? ENDARTERECTOMY Left 11/13/2020  ? Procedure: Evacuation of left neck hematoma;  Surgeon: Katha Cabal, MD;  Location: [facility];  Service: Vascular;  Laterality: Left;  ? ENDARTERECTOMY Left 11/13/2020  ? Procedure: ENDARTERECTOMY CAROTID;  Surgeon: Algernon Huxley, MD;  Location: [facility];  Service: Vascular;  Laterality: Left;  ? TONSILLECTOMY    ? TUBAL LIGATION    ? ? ?There were no vitals filed for this visit. ? ? Subjective Assessment - 07/09/21 1151   ? ? Subjective Patient reports doing well. No pain currently; Reports adherence with HEP, no new falls;   ? Patient is accompained by: --   Daughter, [name]  ? Pertinent History 88 y.o. female with medical history significant for type 2 diabetes mellitus, essential hypertension, acquired hypothyroidism, hyperlipidemia, stage III chronic kidney disease reports increased right sided weakness on 10/19/20. She was diagnosed with left basal ganglia ischemic stroke. She was discharged to inpatient rehab for 10 days and then discharged home. Patient was evaluated by outpatient PT on 11/12/20. CVA was due to carotid stenosis. She underwent left carotid endartectomy on 7/21. Procedure went well however patient suffered from left cervical hematoma and had subsequent surgical procedure to stop the bleed and remove the hematoma on 11/13/20. They were  concerned with her breathing and therefore intubated her for 1 day, she was in ICU for 2 days and transferred to med/surge on 11/15/20. Patient was discharged home on 11/17/20 and is now returning to outpatient PT. She has had the stiches removed and is healing well. Denies any neck pain or stiffness. She lives with her daughter and has 24/7 caregiver support. She is still using RW for most ambulation. She is able to transfer to bedside commode well and is able to stand short time unsupported. She reports feeling very weak and fatigued. She reports she has been dragging her right foot more.  She denies any numbness/tingling; She reports feeling like her legs are wanting to do more but is hesitant because of fear of falling. She is mod I for self care morning routine. She does still receive help with showering. She has been working on increasing her activity but denies any formal exercise.   ? Limitations Standing;Walking   ? How long can you sit comfortably? NA   ? How long can you stand comfortably? 30-60 min with support   ? How long can you walk comfortably? about 100 feet with RW, still limited to short distances;   ? Diagnostic tests MRI shows left basal ganglia infarct 10/21/20   ? Patient Stated Goals "Improve balance and walking and not need RW, get back to independent."   ? Currently in Pain? No/denies   ? Pain Onset In the past 7 days   ? ?  ?  ? ?  ? ? ? ? ? ? ?TREATMENT: ?Warm up on Nustep, BUE/BLE level 2-4 (interval training) x4 min with cues for steps per minute >60 to challenge cardiovascular endurance; ?Vitals after exercise: Spo2 97%, HR 89 ? ?Sitting in chair: ?2# ankle weight: ?LAQ with ankle DF 3 sec hold x10 reps each LE;  ? ?Standing in parallel bars: ?Forward walking over ladder, reciprocal steps x6 laps, required cues for increased step length and improve heel strike for better foot clearance; Patient does require intermittent rail assist with increased instability with less rail assist, requiring CGA for safety;  ?Side stepping over ladder 2# on BLE x1 lap each direction with 1 rail assist; ?  ?NMR: ?Airex beam: ?-tandem stance 20 sec hold x2 reps each foot in front, patient does have increased difficulty when left foot is ahead of right foot due to RLE weakness;  ?Progressed to tandem stance, BUE ball pass side/side x5 reps each foot in front;  ? ?Stepping over 1/2 bolster unsupported x5 reps leading with RLE ? Progressed to stepping over 1/2 bolster with single LE, clapping with BUE and then stepping back together, unsupported x5 reps each LE, reports increased difficulty when  stepping over with RLE due to apraxia/decreased motor control and imbalance;  ?  ?   ?Gait in hallway x80 feet x1 sets without AD, min  A for safety with cues for increased RLE heel strike, increase step length. She was able to exhibit better reciprocal pattern without AD with better coordination and speed, however continues to have unsteadiness due to weakness and imbalance. Consider progressing gait training without AD to facilitate better gait mobility;  ?  ?Gait negotiating obstacles weaving around cones #6 x2 sets with min A for safety,  ?Gait walking over red mat x2 sets with CGA for safety;  ? ?Pt educated throughout session about proper posture and technique with exercises. Improved exercise technique, movement at target joints, use of target muscles after min to mod verbal,  visual, tactile cues. ?  ?Patient tolerated session fair. She does report increased fatigue at end of session. Reinforced HEP ?  ?  ? ? ? ? ? ? ? ? ? ? ? ? ? ? ? ? ? ? ? ? ? ? PT Education - 07/09/21 1151   ? ? Education Details balance/gait safety, HEP   ? Person(s) Educated Patient   ? Methods Explanation;Verbal cues   ? Comprehension Verbalized understanding;Returned demonstration;Verbal cues required;Need further instruction   ? ?  ?  ? ?  ? ? ? PT Short Term Goals - 06/25/21 1151   ? ?  ? PT SHORT TERM GOAL #1  ? Title Patient will be adherent to HEP at least 3x a week to improve functional strength and balance for better safety at home.   ? Baseline 9/21: Pt reports compliance with HEP and is confident, 1/3: doing exercise 2x a week; 06/25/21: 3x/week   ? Time 4   ? Period Weeks   ? Status Achieved   ? Target Date 06/25/21   ?  ? PT SHORT TERM GOAL #2  ? Title Patient will be independent in transferring sit<>Stand without pushing on arm rests to improve ability to get up from chair.   ? Baseline 9/21: pt able to complete STS without use of UEs, 1/3:able to stand but has moderate difficulty requiring increased time; 06/25/21: able to  perform indep without hands but requiring use of momentum to attain standing. Remains stable.   ? Time 4   ? Period Weeks   ? Status Partially Met   ? Target Date 07/23/21   ? ?  ?  ? ?  ? ? ? ? PT Long T

## 2021-07-09 NOTE — Therapy (Signed)
[place] [facility] ?[address] ?[place], [place], [id number] ?Phone: [phone]   Fax:  [phone] ? ?Occupational Therapy Treatment ? ?Patient Details  ?Name: Cassandra BorsJanet Ann Schaefer ?MRN: [id number] ?Date of Birth: May 28, 1928 ?Referring Provider (OT): Dr. Barbette ReichmannVishwanath Hande ? ? ?Encounter Date: 07/09/2021 ? ? OT End of Session - 07/09/21 1105   ? ? Visit Number 56   ? Number of Visits 90   ? Date for OT Re-Evaluation 07/14/21   ? Authorization Time Period Reporting period starting 06/18/2020   ? OT Start Time 1300   ? OT Stop Time 1345   ? OT Time Calculation (min) 45 min   ? Activity Tolerance Patient tolerated treatment well   ? [facility] tasks assessed/performed   ? ?  ?  ? ?  ? ? ?Past Medical History:  ?Diagnosis Date  ? Anemia   ? Cough   ? RESOLVING, NO FEVER  ? Diabetes mellitus without complication (HCC)   ? Gout   ? Hypertension   ? Hypothyroidism   ? PUD (peptic ulcer disease)   ? Stroke [facility](HCC)   ? Wears dentures   ? full upper, partial lower  ? ? ?Past Surgical History:  ?Procedure Laterality Date  ? AMPUTATION TOE Left 12/14/2017  ? Procedure: AMPUTATION TOE-4TH MPJ;  Surgeon: Gwyneth RevelsFowler, Justin, DPM;  [facility];  Service: Podiatry;  Laterality: Left;  IVA LOCAL ?Diabetic - oral meds  ? APPENDECTOMY    ? CATARACT EXTRACTION W/PHACO Right 06/23/2015  ? Procedure: CATARACT EXTRACTION PHACO AND INTRAOCULAR LENS PLACEMENT (IOC);  Surgeon: Sallee LangeSteven Dingeldein, MD;  Location: [facility];  Service: Ophthalmology;  Laterality: Right;  US 01:28 ?AP% 23.4 ?CDE 36.14 ?fluid pack lot # [id number]  ? CATARACT EXTRACTION W/PHACO Left 07/28/2015  ? Procedure: CATARACT EXTRACTION PHACO AND INTRAOCULAR LENS PLACEMENT (IOC);  Surgeon: Sallee LangeSteven Dingeldein, MD;  Location: [facility];  Service: Ophthalmology;  Laterality: Left;  US    1:29.7 ?AP%  24.9 ?CDE   41.32 ?fluid casette lot #[id number] H exp 09/23/2016  ? COONOSCOPY    ? AND ENDOSCOPY  ? DILATION AND  CURETTAGE OF UTERUS    ? ENDARTERECTOMY Left 11/13/2020  ? Procedure: Evacuation of left neck hematoma;  Surgeon: Renford DillsSchnier, Gregory G, MD;  Location: [facility];  Service: Vascular;  Laterality: Left;  ? ENDARTERECTOMY Left 11/13/2020  ? Procedure: ENDARTERECTOMY CAROTID;  Surgeon: Annice Needyew, Jason S, MD;  Location: [facility];  Service: Vascular;  Laterality: Left;  ? TONSILLECTOMY    ? TUBAL LIGATION    ? ? ?There were no vitals filed for this visit. ? ? Subjective Assessment - 07/09/21 1105   ? ? Subjective  Pt. reports being cold today.   ? Patient is accompanied by: Family member   ? Pertinent History R ankle pain, gout, T2DM, HTN, CKDIII; R/L carotid endarectomy   ? Currently in Pain? No/denies   ? ?  ?  ? ?  ? ?OT TREATMENT   ? ?Neuro muscular re-education: ? ?Pt. worked on using her right hand for grasping 2" sticks, and placing them into a resistive pegboard. Pt. worked on [employer]stateisloating her 2nd digit in preparation fro pushing them in.  Pt. worked on removing the sticks using resistive tweezers with the board positioned at a vertical angle. Pt. worked on grasping thin 1" JAMAR sticks from a flat position in the dish with her right hand, and placing them vertically into the pegboard. Pt. worked on using  long thin tweezers to remove the pegs from the pegboard.  ? ?Pt. is making progress with right hand [facility] skills. Pt. has progressed with grasping the small thin pegs from a flat position in the shallow dish. Pt. was more fluidly able to change them from a horizontal to vertical position in preparation for placing them onto the pegboard. Less small objects were sticking to her fingers, and less of them dropped from her fingertips. Pt. was  able to initially grasp the pegs with the tweezers, however required several attempts to grasp, and remove the pegs towards the end of the task. Pt. Continues to work on improving RUE functioning, in order to work towards improving, and maximizing independence with ADLs, and IADL  tasks. ? ? ? ?  ? ? ? ? ? ? ? ? ? ? ? ? ? ? ? ? ? ? ? ? ? OT Education - 07/09/21 1105   ? ? Education Details RUE functioning.   ? Person(s) Educated Patient   ? Methods Explanation;Verbal cues;Demonstration;Tactile cues   ? Comprehension Verbalized understanding;Verbal cues required;Returned demonstration   ? ?  ?  ? ?  ? ? ? OT Short Term Goals - 03/23/21 1657   ? ?  ? OT SHORT TERM GOAL #1  ? Title Pt will perform HEP for RUE strength/coordination independently.   ? Baseline Eval: HEP not yet established in outpatient, but pt is working with putty from [name]; 12/04/2020; [facility] HEP initiated this day with mod vc after return demo; 01/05/2021: indep with currently program, but ongoing as pt progresses; 01/28/2021: vc to revisit and focus on grip and pinch strengthening with putty; 03/23/21: pt is indep with HEP   ? Time 6   ? Period Weeks   ? Status Achieved   ? Target Date 03/23/21   ? ?  ?  ? ?  ? ? ? ? OT Long Term Goals - 06/18/21 1120   ? ?  ? OT LONG TERM GOAL #1  ? Title Pt will improve hand writing to 100% legibility with R hand to be able to independently write a check.   ? Baseline Eval: signature is 75% legible with R hand, requiring extra time; 12/04/2020: no chan to complete.ge from eval; 01/05/2021: Printing is 100% legible, signature is 90% legible, but still requires extra time and effort.; 01/26/21: Signature is 90% legible and speed/fluidity have improved. 20th visit: 90% legible, 03/23/21: 90% legible, extra time 40th visit 90 % legibility with increased time, 50th visit: 90% legible with with increased time required   ? Time 12   ? Period Weeks   ? Status On-going   ? Target Date 07/14/21   ?  ? OT LONG TERM GOAL #2  ? Title Pt will improve R hand coordination to enable good manipulation of iphone using R dominant hand   ? Baseline Eval: Pt uses L hand d/t R hand lacking sufficient FMC to press buttons on phone; 12/04/2020: no change from eval; 01/05/2021: R hand coordination is improving but pt still uses  L hand to dial phone; 01/28/21: Pt using R dominant hand to press buttons and apps on phone 50% of the time.20th visit: Right hand [facility] skilols are improving, Pt. is improving with manipulation of the iphone; 03/23/21: Pt now consistently using R hand to press buttons on phone with good accuracy.40th visit: Pt. continues to have difficulty modifying the accurate amount of pressure needed to wipe the phone. 50th visit: Pt. is improving, however has  difficulty modulating the amount of pressure needed for swiping the phone, as pt. swipes too hard.   ? Time 12   ? Period Weeks   ? Status On-going   ? Target Date 07/14/21   ?  ? OT LONG TERM GOAL #3  ? Title Pt will improve GMC throughout RUE to enable pt to reach for and pick up ADL supplies with R dominant hand without dropping or knocking over objects.   ? Baseline Eval: Pt reports RUE is clumsy and easily knocks over objects when trying to reach for ADL supplies.  Pt verbalizes that her "aim is off."; 12/04/2020: pt reports slight improvement since eval and has started using her R hand to eat, but still feels clumsy; 01/05/2021: Greatly improved; pt is using silverware to eat with R hand, can comb hair with RUE, still mildly ataxic; 01/26/21: pt reports difficulty picking up objects within a small space (ie; may knock the toothpaste holder down when reaching for toothbrush), but reaching for light/larger objects is improving 20th visit: pt. continues to be mildly ataxic, however is consistently improving with motor control, and FMC while reaching targets; 03/23/21: Pt reaches with accuracy towards targets if she takes her time (pt drops and knocks things over when moving at a faster/normal pace) 04/23/2021: Pt. continues to present with difficulty with depth perception, and impaired FMC.40th visit: Pt. is improving, however tends to knock over lighter weight objects.50th visit: Pt. has improved with reaching for items without knocking them over, however continues to need  work on accuracy.   ? Time 12   ? Period Weeks   ? Status On-going   ? Target Date 07/14/21   ?  ? OT LONG TERM GOAL #4  ? Title Pt will improve FOTO score to 68 or better to indicate measurable functional improveme

## 2021-07-14 ENCOUNTER — Encounter: Payer: Self-pay | Admitting: Occupational Therapy

## 2021-07-14 ENCOUNTER — Other Ambulatory Visit: Payer: Self-pay

## 2021-07-14 ENCOUNTER — Ambulatory Visit: Payer: Medicare HMO | Admitting: Occupational Therapy

## 2021-07-14 ENCOUNTER — Ambulatory Visit: Payer: Medicare HMO | Admitting: Physical Therapy

## 2021-07-14 ENCOUNTER — Encounter: Payer: Self-pay | Admitting: Physical Therapy

## 2021-07-14 DIAGNOSIS — M6281 Muscle weakness (generalized): Secondary | ICD-10-CM

## 2021-07-14 DIAGNOSIS — R262 Difficulty in walking, not elsewhere classified: Secondary | ICD-10-CM

## 2021-07-14 DIAGNOSIS — R2689 Other abnormalities of gait and mobility: Secondary | ICD-10-CM

## 2021-07-14 DIAGNOSIS — R269 Unspecified abnormalities of gait and mobility: Secondary | ICD-10-CM

## 2021-07-14 DIAGNOSIS — R2681 Unsteadiness on feet: Secondary | ICD-10-CM

## 2021-07-14 DIAGNOSIS — R482 Apraxia: Secondary | ICD-10-CM

## 2021-07-14 DIAGNOSIS — R278 Other lack of coordination: Secondary | ICD-10-CM

## 2021-07-14 NOTE — Therapy (Signed)
[place] ?[place] [facility] ?[place], [place], [address] ?Phone: [phone]   Fax:  [phone] ? ?Physical Therapy Treatment ? ?Patient Details  ?Name: Cassandra Schaefer ?MRN: [id number] ?Date of Birth: 04-Apr-1929 ?Referring Provider (PT): Dr. Dew/Dr. Ginette Pitman PCP ? ? ?Encounter Date: 07/14/2021 ? ? PT End of Session - 07/14/21 1150   ? ? Visit Number 49   ? Number of Visits 70   ? Date for PT Re-Evaluation 08/20/21   ? Authorization Type [employer]   ? PT Start Time 1143   ? PT Stop Time 1228   ? PT Time Calculation (min) 45 min   ? Equipment Utilized During Treatment Gait belt   ? Activity Tolerance Patient tolerated treatment well   ? Behavior During Therapy [facility] for tasks assessed/performed   ? ?  ?  ? ?  ? ? ?Past Medical History:  ?Diagnosis Date  ? Anemia   ? Cough   ? RESOLVING, NO FEVER  ? Diabetes mellitus without complication ([place])   ? Gout   ? Hypertension   ? Hypothyroidism   ? PUD (peptic ulcer disease)   ? Stroke [facility])   ? Wears dentures   ? full upper, partial lower  ? ? ?Past Surgical History:  ?Procedure Laterality Date  ? AMPUTATION TOE Left 12/14/2017  ? Procedure: AMPUTATION TOE-4TH MPJ;  Surgeon: Samara Deist, DPM;  Location: [place];  Service: Podiatry;  Laterality: Left;  IVA LOCAL ?Diabetic - oral meds  ? APPENDECTOMY    ? CATARACT EXTRACTION W/PHACO Right 06/23/2015  ? Procedure: CATARACT EXTRACTION PHACO AND INTRAOCULAR LENS PLACEMENT (IOC);  Surgeon: Estill Cotta, MD;  Location: [facility];  Service: Ophthalmology;  Laterality: Right;  [place] 01:28 ?AP% 23.4 ?CDE 36.14 ?fluid pack lot # [id number]  ? CATARACT EXTRACTION W/PHACO Left 07/28/2015  ? Procedure: CATARACT EXTRACTION PHACO AND INTRAOCULAR LENS PLACEMENT (IOC);  Surgeon: Estill Cotta, MD;  Location: [facility];  Service: Ophthalmology;  Laterality: Left;  [place]    1:29.7 ?AP%  24.9 ?CDE   41.32 ?fluid casette lot #[id number] H exp 09/23/2016  ? COONOSCOPY    ? AND ENDOSCOPY   ? DILATION AND CURETTAGE OF UTERUS    ? ENDARTERECTOMY Left 11/13/2020  ? Procedure: Evacuation of left neck hematoma;  Surgeon: Katha Cabal, MD;  Location: [facility];  Service: Vascular;  Laterality: Left;  ? ENDARTERECTOMY Left 11/13/2020  ? Procedure: ENDARTERECTOMY CAROTID;  Surgeon: Algernon Huxley, MD;  Location: [facility];  Service: Vascular;  Laterality: Left;  ? TONSILLECTOMY    ? TUBAL LIGATION    ? ? ?There were no vitals filed for this visit. ? ? Subjective Assessment - 07/14/21 1150   ? ? Subjective Patient reports doing well. No new stumbles or falls. Denies any pain; Reports HEP is still challenging;   ? Patient is accompained by: --   Daughter, [name]  ? Pertinent History 88 y.o. female with medical history significant for type 2 diabetes mellitus, essential hypertension, acquired hypothyroidism, hyperlipidemia, stage III chronic kidney disease reports increased right sided weakness on 10/19/20. She was diagnosed with left basal ganglia ischemic stroke. She was discharged to inpatient rehab for 10 days and then discharged home. Patient was evaluated by outpatient PT on 11/12/20. CVA was due to carotid stenosis. She underwent left carotid endartectomy on 7/21. Procedure went well however patient suffered from left cervical hematoma and had subsequent surgical procedure to stop the bleed and remove the hematoma on  11/13/20. They were concerned with her breathing and therefore intubated her for 1 day, she was in ICU for 2 days and transferred to med/surge on 11/15/20. Patient was discharged home on 11/17/20 and is now returning to outpatient PT. She has had the stiches removed and is healing well. Denies any neck pain or stiffness. She lives with her daughter and has 24/7 caregiver support. She is still using RW for most ambulation. She is able to transfer to bedside commode well and is able to stand short time unsupported. She reports feeling very weak and fatigued. She reports she has been dragging her  right foot more. She denies any numbness/tingling; She reports feeling like her legs are wanting to do more but is hesitant because of fear of falling. She is mod I for self care morning routine. She does still receive help with showering. She has been working on increasing her activity but denies any formal exercise.   ? Limitations Standing;Walking   ? How long can you sit comfortably? NA   ? How long can you stand comfortably? 30-60 min with support   ? How long can you walk comfortably? about 100 feet with RW, still limited to short distances;   ? Diagnostic tests MRI shows left basal ganglia infarct 10/21/20   ? Patient Stated Goals "Improve balance and walking and not need RW, get back to independent."   ? Currently in Pain? No/denies   ? Pain Onset In the past 7 days   ? ?  ?  ? ?  ? ? ? ? ?  ?  ?TREATMENT: ?Warm up on Nustep, BUE/BLE level 2-4 (interval training) x4 min with cues for steps per minute >60 to challenge cardiovascular endurance; ?Vitals after exercise: Spo2 97%, HR 89 ?  ?Sitting in chair: ?2# ankle weight: ?LAQ with ankle DF 5 sec hold x10 reps each LE;  ? ?Standing in parallel bars: ?Forward walking over ladder, reciprocal steps x2 laps, no weight, progressed to 4 laps with 2# ankle weight, required cues for increased step length and improve heel strike for better foot clearance; Patient does require intermittent rail assist with increased instability with less rail assist, requiring CGA for safety;  ?Side stepping over ladder 2# on BLE x2 lap each direction with 1 rail assist; ?  ?NMR: ?Airex beam: ?-tandem stance 30 sec hold x1 reps each foot in front, ? Progressed to tandem stance with BUE ball pass side/side #3, x2 reps each foot in front with min A for safety; ? patient does have increased difficulty when left foot is ahead of right foot due to RLE weakness;  ?Progressed to tandem stance, BUE ball pass side/side x5 reps each foot in front;  ?  ?Standing facing side on airex beam: ?-side  stepping over orange hurdle x5 reps each direction with 2-1 rail assist; ?-stepping over orange hurdle with single leg x5 reps with 1-0 rail assist x5 reps each LE, moderate difficulty reported, requiring min A for safety;  ?   ?Resisted walking 7.5# forward/backward x2 laps each requiring min A for safety; Does report increased fatigue with advanced exercise.  ?  ?Pt educated throughout session about proper posture and technique with exercises. Improved exercise technique, movement at target joints, use of target muscles after min to mod verbal, visual, tactile cues. ?  ?Patient tolerated session fair. She does report increased fatigue at end of session. Reinforced HEP ?  ?  ?  ? ? ? ? ? ? ? ? ? ? ? ? ? ? ? ? ? ? ? ? ? ? ? ?  PT Education - 07/14/21 1150   ? ? Education Details exercise technique/balance/gait safety, HEP   ? Person(s) Educated Patient   ? Methods Explanation;Verbal cues   ? Comprehension Verbalized understanding;Returned demonstration;Verbal cues required;Need further instruction   ? ?  ?  ? ?  ? ? ? PT Short Term Goals - 06/25/21 1151   ? ?  ? PT SHORT TERM GOAL #1  ? Title Patient will be adherent to HEP at least 3x a week to improve functional strength and balance for better safety at home.   ? Baseline 9/21: Pt reports compliance with HEP and is confident, 1/3: doing exercise 2x a week; 06/25/21: 3x/week   ? Time 4   ? Period Weeks   ? Status Achieved   ? Target Date 06/25/21   ?  ? PT SHORT TERM GOAL #2  ? Title Patient will be independent in transferring sit<>Stand without pushing on arm rests to improve ability to get up from chair.   ? Baseline 9/21: pt able to complete STS without use of UEs, 1/3:able to stand but has moderate difficulty requiring increased time; 06/25/21: able to perform indep without hands but requiring use of momentum to attain standing. Remains stable.   ? Time 4   ? Period Weeks   ? Status Partially Met   ? Target Date 07/23/21   ? ?  ?  ? ?  ? ? ? ? PT Long Term Goals -  06/25/21 1154   ? ?  ? PT LONG TERM GOAL #1  ? Title Patient (> 21 years old) will complete five times sit to stand test in < 15 seconds indicating an increased LE strength and improved balance.   ? [place]

## 2021-07-14 NOTE — Therapy (Signed)
[facility] ?[place], [place], [address] ?Phone: [phone]   Fax:  [phone] ? ?Occupational Therapy Treatment/Recertification ? ?Patient Details  ?Name: Cassandra Schaefer ?MRN: [id number] ?Date of Birth: 20-Jun-1928 ?Referring Provider (OT): Dr. Tracie Harrier ? ? ?Encounter Date: 07/14/2021 ? ? OT End of Session - 07/14/21 1109   ? ? Visit Number [id number]   ? Number of Visits 90   ? Date for OT Re-Evaluation 10/06/21   ? Authorization Time Period Reporting period starting 06/18/2020   ? OT Start Time 1100   ? OT Stop Time 1145   ? OT Time Calculation (min) 45 min   ? Activity Tolerance Patient tolerated treatment well   ? Behavior During Therapy [facility] for tasks assessed/performed   ? ?  ?  ? ?  ? ? ?Past Medical History:  ?Diagnosis Date  ? Anemia   ? Cough   ? RESOLVING, NO FEVER  ? Diabetes mellitus without complication ([place])   ? Gout   ? Hypertension   ? Hypothyroidism   ? PUD (peptic ulcer disease)   ? Stroke [facility])   ? Wears dentures   ? full upper, partial lower  ? ? ?Past Surgical History:  ?Procedure Laterality Date  ? AMPUTATION TOE Left 12/14/2017  ? Procedure: AMPUTATION TOE-4TH MPJ;  Surgeon: Samara Deist, DPM;  Location: [facility];  Service: Podiatry;  Laterality: Left;  IVA LOCAL ?Diabetic - oral meds  ? APPENDECTOMY    ? CATARACT EXTRACTION W/PHACO Right 06/23/2015  ? Procedure: CATARACT EXTRACTION PHACO AND INTRAOCULAR LENS PLACEMENT (IOC);  Surgeon: Estill Cotta, MD;  Location: [facility];  Service: Ophthalmology;  Laterality: Right;  [place] 01:28 ?AP% 23.4 ?CDE 36.14 ?fluid pack lot # [id number]  ? CATARACT EXTRACTION W/PHACO Left 07/28/2015  ? Procedure: CATARACT EXTRACTION PHACO AND INTRAOCULAR LENS PLACEMENT (IOC);  Surgeon: Estill Cotta, MD;  Location: [facility];  Service: Ophthalmology;  Laterality: Left;  [place]    1:29.7 ?AP%  24.9 ?CDE   41.32 ?fluid casette lot [id number] exp 09/23/2016  ? COONOSCOPY    ? AND ENDOSCOPY  ?  DILATION AND CURETTAGE OF UTERUS    ? ENDARTERECTOMY Left 11/13/2020  ? Procedure: Evacuation of left neck hematoma;  Surgeon: Katha Cabal, MD;  Location: [facility];  Service: Vascular;  Laterality: Left;  ? ENDARTERECTOMY Left 11/13/2020  ? Procedure: ENDARTERECTOMY CAROTID;  Surgeon: Algernon Huxley, MD;  Location: [facility];  Service: Vascular;  Laterality: Left;  ? TONSILLECTOMY    ? TUBAL LIGATION    ? ? ?There were no vitals filed for this visit. ? ? Subjective Assessment - 07/14/21 1109   ? ? Subjective  Pt. reports being cold today.   ? Patient is accompanied by: Family member   ? Pertinent History R ankle pain, gout, T2DM, HTN, CKDIII; R/L carotid endarectomy   ? Patient Stated Goals "I want to improve my aim when I'm using my R arm."   ? Currently in Pain? No/denies   ? ?  ?  ? ?  ? ? ? ? ? [facility] OT Assessment - 07/14/21 1111   ? ?  ? Coordination  ? Right 9 Hole Peg Test 1 min. & 2sec.  (2nd attempt)   ?  ? Hand Function  ? Right Hand Grip (lbs) 32   ? Right Hand Lateral Pinch 13 lbs   ? Right Hand 3 Point Pinch 11 lbs   ? Left Hand Grip (  lbs) 27   ? Left Hand Lateral Pinch 13 lbs   ? Left 3 point pinch 11 lbs   ? ?  ?  ? ?  ? ?Neuromuscular re-education: ? ?Pt. worked on simulated medication management tasks. Pt. worked on grasping small beads using a 2pt. Pincer grasp, and worked on lifting them out of a small daily pillbox. Pt. presented with difficulty grasping them efficiently. Pt. Reports having difficulty with manipulating her small medications without dropping them. ? ?Measurements were obtained, and goals were reviewed with the pt. Pt. Is progressing with right lateral, and 3pt. Pinch strength, motor control, and [facility] skills. Pt. Is improving with the accuracy when reaching for objects. Pt. is now able to use her right hand to manage, and utilize her cellphone to make calls, and is now attempting to write in greeting cards for her family. Pt. Continues to have impaired RUE motor control, and [place]  skills speed, and dexterity. Pt. Continues to work on these tasks in order to  manipulate medication/pills from a pillbox, and improve writing legibility, and speed. ? ? ? ? ? ? ? ? ? ? ? ? ? ? ? ? ? OT Education - 07/14/21 1109   ? ? Education Details RUE functioning.   ? Person(s) Educated Patient   ? Methods Explanation;Verbal cues;Demonstration;Tactile cues   ? Comprehension Verbalized understanding;Verbal cues required;Returned demonstration   ? ?  ?  ? ?  ? ? ? OT Short Term Goals - 03/23/21 1657   ? ?  ? OT SHORT TERM GOAL #1  ? Title Pt will perform HEP for RUE strength/coordination independently.   ? Baseline Eval: HEP not yet established in outpatient, but pt is working with putty from [name]; 12/04/2020; [facility] HEP initiated this day with mod vc after return demo; 01/05/2021: indep with currently program, but ongoing as pt progresses; 01/28/2021: vc to revisit and focus on grip and pinch strengthening with putty; 03/23/21: pt is indep with HEP   ? Time 6   ? Period Weeks   ? Status Achieved   ? Target Date 03/23/21   ? ?  ?  ? ?  ? ? ? ? OT Long Term Goals - 07/14/21 1120   ? ?  ? OT LONG TERM GOAL #1  ? Title Pt will improve hand writing to 100% legibility with R hand to be able to independently write a check.   ? Baseline Eval: signature is 75% legible with R hand, requiring extra time; 12/04/2020: no chan to complete.ge from eval; 01/05/2021: Printing is 100% legible, signature is 90% legible, but still requires extra time and effort.; 01/26/21: Signature is 90% legible and speed/fluidity have improved. 20th visit: 90% legible, 03/23/21: 90% legible, extra time 40th visit 90 % legibility with increased time, 50th visit: 90% legible with with increased time required 07/14/2021: 90% legibility   ? Time 12   ? Period Weeks   ? Status On-going   ? Target Date 10/06/21   ?  ? OT LONG TERM GOAL #2  ? Title Pt will improve R hand coordination to enable good manipulation of iphone using R dominant hand   ? Baseline Eval:  Pt uses L hand d/t R hand lacking sufficient [place] to press buttons on phone; 12/04/2020: no change from eval; 01/05/2021: R hand coordination is improving but pt still uses L hand to dial phone; 01/28/21: Pt using R dominant hand to press buttons and apps on phone 50% of the time.20th visit: Right  hand [facility] skilols are improving, Pt. is improving with manipulation of the iphone; 03/23/21: Pt now consistently using R hand to press buttons on phone with good accuracy.40th visit: Pt. continues to have difficulty modifying the accurate amount of pressure needed to wipe the phone. 50th visit: Pt. is improving, however has difficulty modulating the amount of pressure needed for swiping the phone, as pt. swipes too hard. 07/16/2021: Pt. is now able to manipulate buttons on the Iphone to make calls.   ? Time 12   ? Period Weeks   ? Status Achieved   ?  ? OT LONG TERM GOAL #3  ? Title Pt will improve [place] throughout RUE to enable pt to reach for and pick up ADL supplies with R dominant hand without dropping or knocking over objects.   ? Baseline Eval: Pt reports RUE is clumsy and easily knocks over objects when trying to reach for ADL supplies.  Pt verbalizes that her "aim is off."; 12/04/2020: pt reports slight improvement since eval and has started using her R hand to eat, but still feels clumsy; 01/05/2021: Greatly improved; pt is using silverware to eat with R hand, can comb hair with RUE, still mildly ataxic; 01/26/21: pt reports difficulty picking up objects within a small space (ie; may knock the toothpaste holder down when reaching for toothbrush), but reaching for light/larger objects is improving 20th visit: pt. continues to be mildly ataxic, however is consistently improving with motor control, and [place] while reaching targets; 03/23/21: Pt reaches with accuracy towards targets if she takes her time (pt drops and knocks things over when moving at a faster/normal pace) 04/23/2021: Pt. continues to present with difficulty with  depth perception, and impaired [place].40th visit: Pt. is improving, however tends to knock over lighter weight objects.50th visit: Pt. has improved with reaching for items without knocking them over, however

## 2021-07-16 ENCOUNTER — Encounter: Payer: Self-pay | Admitting: Physical Therapy

## 2021-07-16 ENCOUNTER — Ambulatory Visit: Payer: Medicare HMO | Admitting: Physical Therapy

## 2021-07-16 ENCOUNTER — Encounter: Payer: Self-pay | Admitting: Occupational Therapy

## 2021-07-16 ENCOUNTER — Other Ambulatory Visit: Payer: Self-pay

## 2021-07-16 ENCOUNTER — Ambulatory Visit: Payer: Medicare HMO | Admitting: Occupational Therapy

## 2021-07-16 DIAGNOSIS — M6281 Muscle weakness (generalized): Secondary | ICD-10-CM

## 2021-07-16 DIAGNOSIS — R482 Apraxia: Secondary | ICD-10-CM

## 2021-07-16 DIAGNOSIS — R278 Other lack of coordination: Secondary | ICD-10-CM

## 2021-07-16 DIAGNOSIS — R2689 Other abnormalities of gait and mobility: Secondary | ICD-10-CM

## 2021-07-16 DIAGNOSIS — R269 Unspecified abnormalities of gait and mobility: Secondary | ICD-10-CM

## 2021-07-16 DIAGNOSIS — R2681 Unsteadiness on feet: Secondary | ICD-10-CM

## 2021-07-16 DIAGNOSIS — R262 Difficulty in walking, not elsewhere classified: Secondary | ICD-10-CM

## 2021-07-16 NOTE — Therapy (Signed)
[facility] ?[address] ?[place], [place], [address] ?Phone: [phone]   Fax:  [phone] ? ?Occupational Therapy Treatment ? ?Patient Details  ?Name: Cassandra Schaefer ?MRN: [id number] ?Date of Birth: [birth year] ?Referring Provider (OT): Dr. Barbette Reichmann ? ? ?Encounter Date: 07/16/2021 ? ? OT End of Session - 07/16/21 1117   ? ? Visit Number 58   ? Number of Visits 90   ? Date for OT Re-Evaluation 10/06/21   ? Authorization Time Period Reporting period starting 06/18/2020   ? OT Start Time 1104   ? OT Stop Time 1145   ? OT Time Calculation (min) 41 min   ? Activity Tolerance Patient tolerated treatment well   ? Behavior During Therapy [facility] for tasks assessed/performed   ? ?  ?  ? ?  ? ? ?Past Medical History:  ?Diagnosis Date  ? Anemia   ? Cough   ? RESOLVING, NO FEVER  ? Diabetes mellitus without complication ([facility])   ? Gout   ? Hypertension   ? Hypothyroidism   ? PUD (peptic ulcer disease)   ? Stroke [facility])   ? Wears dentures   ? full upper, partial lower  ? ? ?Past Surgical History:  ?Procedure Laterality Date  ? AMPUTATION TOE Left 12/14/2017  ? Procedure: AMPUTATION TOE-4TH MPJ;  Surgeon: Gwyneth Revels, DPM;  Location: [facility];  Service: Podiatry;  Laterality: Left;  IVA LOCAL ?Diabetic - oral meds  ? APPENDECTOMY    ? CATARACT EXTRACTION W/PHACO Right 06/23/2015  ? Procedure: CATARACT EXTRACTION PHACO AND INTRAOCULAR LENS PLACEMENT (IOC);  Surgeon: Sallee Lange, MD;  Location: [facility];  Service: Ophthalmology;  Laterality: Right;  [place] 01:28 ?AP% 23.4 ?CDE 36.14 ?fluid pack lot # [id number]  ? CATARACT EXTRACTION W/PHACO Left 07/28/2015  ? Procedure: CATARACT EXTRACTION PHACO AND INTRAOCULAR LENS PLACEMENT (IOC);  Surgeon: Sallee Lange, MD;  Location: [facility];  Service: Ophthalmology;  Laterality: Left;  [place]    1:29.7 ?AP%  24.9 ?CDE   41.32 ?fluid casette lot #[id number] H exp 09/23/2016  ? COONOSCOPY    ? AND ENDOSCOPY  ? DILATION AND  CURETTAGE OF UTERUS    ? ENDARTERECTOMY Left 11/13/2020  ? Procedure: Evacuation of left neck hematoma;  Surgeon: Renford Dills, MD;  Location: [facility];  Service: Vascular;  Laterality: Left;  ? ENDARTERECTOMY Left 11/13/2020  ? Procedure: ENDARTERECTOMY CAROTID;  Surgeon: Annice Needy, MD;  Location: [facility];  Service: Vascular;  Laterality: Left;  ? TONSILLECTOMY    ? TUBAL LIGATION    ? ? ?There were no vitals filed for this visit. ? ? Subjective Assessment - 07/16/21 1116   ? ? Subjective  Pt. reports being cold today.   ? Patient is accompanied by: Family member   ? Pertinent History R ankle pain, gout, T2DM, HTN, CKDIII; R/L carotid endarectomy   ? Patient Stated Goals "I want to improve my aim when I'm using my R arm."   ? Currently in Pain? No/denies   ? ?  ?  ? ?  ? ?Neuro muscular re-education: ?  ?Pt. worked on [facility] skills grasping 1" sticks, 1/4" collars, and 1/4" washers. Pt. worked on storing the objects in the palm, and translatory skills moving the items from the palm of the hand to the tip of the 2nd digit, and thumb. Pt. worked on removing the pegs using bilateral alternating hand patterns.  ?  ?Pt. Reports that she hopes to get out,  and garden in her yard this weekend. Pt. continues to make progress overall with her right UE functioning, and right hand [facility] skills. Pt. continues to present with impaired right hand motor control, and FMC with the most difficulty performing translatory movements with the right hand. Pt. continues to present with difficulty with [facility] when further challenged with dual tasking. Pt. continues to work on improving [facility] skills, motor control, and accuracy with reaching in order to work towards improving right hand function, and improving/maximizing independence with ADLs, and IADLs ?  ?  ? ? ? ? ? ? ? ? ? ? ? ? ? ? ? ? ? ? ? ? ? OT Education - 07/16/21 1117   ? ? Education Details RUE functioning.   ? Person(s) Educated Patient   ? Methods Explanation;Verbal  cues;Demonstration;Tactile cues   ? Comprehension Verbalized understanding;Verbal cues required;Returned demonstration   ? ?  ?  ? ?  ? ? ? OT Short Term Goals - 03/23/21 1657   ? ?  ? OT SHORT TERM GOAL #1  ? Title Pt will perform HEP for RUE strength/coordination independently.   ? Baseline Eval: HEP not yet established in outpatient, but pt is working with putty from [name]; [facility] this day with mod vc after return demo; 01/05/2021: indep with currently program, but ongoing as pt progresses; 01/28/2021: vc to revisit and focus on grip and pinch strengthening with putty; 03/23/21: pt is indep with HEP   ? Time 6   ? Period Weeks   ? Status Achieved   ? Target Date 03/23/21   ? ?  ?  ? ?  ? ? ? ? OT Long Term Goals - 07/14/21 1120   ? ?  ? OT LONG TERM GOAL #1  ? Title Pt will improve hand writing to 100% legibility with R hand to be able to independently write a check.   ? Baseline Eval: signature is 75% legible with R hand, requiring extra time; 12/04/2020: no chan to complete.ge from eval; 01/05/2021: Printing is 100% legible, signature is 90% legible, but still requires extra time and effort.; 01/26/21: Signature is 90% legible and speed/fluidity have improved. 20th visit: 90% legible, 03/23/21: 90% legible, extra time 40th visit 90 % legibility with increased time, 50th visit: 90% legible with with increased time required 07/14/2021: 90% legibility.   ? Time 12   ? Period Weeks   ? Status On-going   ? Target Date 10/06/21   ?  ? OT LONG TERM GOAL #2  ? Title Pt will improve R hand coordination to enable good manipulation of iphone using R dominant hand   ? Baseline Eval: Pt uses L hand d/t R hand lacking sufficient FMC to press buttons on phone; 12/04/2020: no change from eval; 01/05/2021: R hand coordination is improving but pt still uses L hand to dial phone; 01/28/21: Pt using R dominant hand to press buttons and apps on phone 50% of the time.20th visit: Right hand [facility] skilols are improving, Pt.  is improving with manipulation of the iphone; 03/23/21: Pt now consistently using R hand to press buttons on phone with good accuracy.40th visit: Pt. continues to have difficulty modifying the accurate amount of pressure needed to wipe the phone. 50th visit: Pt. is improving, however has difficulty modulating the amount of pressure needed for swiping the phone, as pt. swipes too hard. 07/16/2021: Pt. is now able to manipulate buttons on the Iphone to make calls.   ? Time  12   ? Period Weeks   ? Status Achieved   ?  ? OT LONG TERM GOAL #3  ? Title Pt will improve [place] throughout RUE to enable pt to reach for and pick up ADL supplies with R dominant hand without dropping or knocking over objects.   ? Baseline Eval: Pt reports RUE is clumsy and easily knocks over objects when trying to reach for ADL supplies.  Pt verbalizes that her "aim is off."; 12/04/2020: pt reports slight improvement since eval and has started using her R hand to eat, but still feels clumsy; 01/05/2021: Greatly improved; pt is using silverware to eat with R hand, can comb hair with RUE, still mildly ataxic; 01/26/21: pt reports difficulty picking up objects within a small space (ie; may knock the toothpaste holder down when reaching for toothbrush), but reaching for light/larger objects is improving 20th visit: pt. continues to be mildly ataxic, however is consistently improving with motor control, and [place] while reaching targets; 03/23/21: Pt reaches with accuracy towards targets if she takes her time (pt drops and knocks things over when moving at a faster/normal pace) 04/23/2021: Pt. continues to present with difficulty with depth perception, and impaired FMC.40th visit: Pt. is improving, however tends to knock over lighter weight objects.50th visit: Pt. has improved with reaching for items without knocking them over, however continues to need work on accuracy.07/14/2021: Pt. is improving while motor control, and accuracy of reaching for objects. Pt.  continues to work towards improvving efficiency with reaching for objects.   ? Time 12   ? Period Weeks   ? Status On-going   ? Target Date 10/06/21   ?  ? OT LONG TERM GOAL #4  ? Title Pt will improve FOTO score to 6

## 2021-07-16 NOTE — Therapy (Signed)
?[facility] ?[place], [place], [address] ?Phone: [phone]   Fax:  [phone] ? ?Physical Therapy Treatment ? ?Patient Details  ?Name: Cassandra Schaefer ?MRN: [id number] ?Date of Birth: April 13, 1929 ?Referring Provider (PT): Dr. Dew/Dr. Ginette Pitman PCP ? ? ?Encounter Date: 07/16/2021 ? ? PT End of Session - 07/16/21 1255   ? ? Visit Number 16   ? Number of Visits 70   ? Date for PT Re-Evaluation 08/20/21   ? Authorization Type [employer]   ? PT Start Time 1146   ? PT Stop Time 1230   ? PT Time Calculation (min) 44 min   ? Equipment Utilized During Treatment Gait belt   ? Activity Tolerance Patient tolerated treatment well   ? Behavior During Therapy [facility] for tasks assessed/performed   ? ?  ?  ? ?  ? ? ?Past Medical History:  ?Diagnosis Date  ? Anemia   ? Cough   ? RESOLVING, NO FEVER  ? Diabetes mellitus without complication ([place])   ? Gout   ? Hypertension   ? Hypothyroidism   ? PUD (peptic ulcer disease)   ? Stroke [facility])   ? Wears dentures   ? full upper, partial lower  ? ? ?Past Surgical History:  ?Procedure Laterality Date  ? AMPUTATION TOE Left 12/14/2017  ? Procedure: AMPUTATION TOE-4TH MPJ;  Surgeon: Samara Deist, DPM;  Location: [place];  Service: Podiatry;  Laterality: Left;  IVA LOCAL ?Diabetic - oral meds  ? APPENDECTOMY    ? CATARACT EXTRACTION W/PHACO Right 06/23/2015  ? Procedure: CATARACT EXTRACTION PHACO AND INTRAOCULAR LENS PLACEMENT (IOC);  Surgeon: Estill Cotta, MD;  Location: [facility];  Service: Ophthalmology;  Laterality: Right;  [place] 01:28 ?AP% 23.4 ?CDE 36.14 ?fluid pack lot # [id number]  ? CATARACT EXTRACTION W/PHACO Left 07/28/2015  ? Procedure: CATARACT EXTRACTION PHACO AND INTRAOCULAR LENS PLACEMENT (IOC);  Surgeon: Estill Cotta, MD;  Location: [facility];  Service: Ophthalmology;  Laterality: Left;  [place]    1:29.7 ?AP%  24.9 ?CDE   41.32 ?fluid casette lot #[id number] H exp 09/23/2016  ? COONOSCOPY    ? AND ENDOSCOPY   ? DILATION AND CURETTAGE OF UTERUS    ? ENDARTERECTOMY Left 11/13/2020  ? Procedure: Evacuation of left neck hematoma;  Surgeon: Katha Cabal, MD;  Location: [facility];  Service: Vascular;  Laterality: Left;  ? ENDARTERECTOMY Left 11/13/2020  ? Procedure: ENDARTERECTOMY CAROTID;  Surgeon: Algernon Huxley, MD;  Location: [facility];  Service: Vascular;  Laterality: Left;  ? TONSILLECTOMY    ? TUBAL LIGATION    ? ? ?There were no vitals filed for this visit. ? ? Subjective Assessment - 07/16/21 1255   ? ? Subjective Patient reports doing well. No new stumbles or falls. Denies any pain; Reports HEP is still challenging;   ? Patient is accompained by: --   Daughter, Cassandra Schaefer  ? Pertinent History 88 y.o. female with medical history significant for type 2 diabetes mellitus, essential hypertension, acquired hypothyroidism, hyperlipidemia, stage III chronic kidney disease reports increased right sided weakness on 10/19/20. She was diagnosed with left basal ganglia ischemic stroke. She was discharged to inpatient rehab for 10 days and then discharged home. Patient was evaluated by outpatient PT on 11/12/20. CVA was due to carotid stenosis. She underwent left carotid endartectomy on 7/21. Procedure went well however patient suffered from left cervical hematoma and had subsequent surgical procedure to stop the bleed and remove the hematoma on  11/13/20. They were concerned with her breathing and therefore intubated her for 1 day, she was in ICU for 2 days and transferred to med/surge on 11/15/20. Patient was discharged home on 11/17/20 and is now returning to outpatient PT. She has had the stiches removed and is healing well. Denies any neck pain or stiffness. She lives with her daughter and has 24/7 caregiver support. She is still using RW for most ambulation. She is able to transfer to bedside commode well and is able to stand short time unsupported. She reports feeling very weak and fatigued. She reports she has been dragging her  right foot more. She denies any numbness/tingling; She reports feeling like her legs are wanting to do more but is hesitant because of fear of falling. She is mod I for self care morning routine. She does still receive help with showering. She has been working on increasing her activity but denies any formal exercise.   ? Limitations Standing;Walking   ? How long can you sit comfortably? NA   ? How long can you stand comfortably? 30-60 min with support   ? How long can you walk comfortably? about 100 feet with RW, still limited to short distances;   ? Diagnostic tests MRI shows left basal ganglia infarct 10/21/20   ? Patient Stated Goals "Improve balance and walking and not need RW, get back to independent."   ? Currently in Pain? No/denies   ? Pain Onset In the past 7 days   ? ?  ?  ? ?  ? ? ? ? ?TREATMENT: ?Standing in parallel bars: ?Forward walking over ladder, reciprocal steps x 4 laps with 2# ankle weight, required cues for increased step length and improve heel strike for better foot clearance; Patient does require intermittent rail assist with increased instability with less rail assist, requiring CGA for safety;  ?High knee march to challenge hip strength and also improve foot clearance 2# x4 laps with intermittent rail assist;  ?Side stepping over ladder 2# on BLE x2 lap each direction with 1 rail assist; does exhibit increased RLE foot drag with increased repetition;  ?  ? ?Standing on airex pad: ?-feet apart: Heel /toe raises unsupported x10 reps with CGA for safety; ?-Stepping forward/backward one airex to another x10 reps unsupported ?-staggered stance, one foot on airex, one foot on other airex: ? Unsupported standing x20 sec ? Progressed to head turns side/side x5 reps each; ?-90 degree turns airex to airex x5 reps with min A; ? ?Gait outside: ?Pt ambulated from gym to outside pushing wheelchair x300 feet with min A for safety;  ?Patient instructed in gait on thick grass, unstable surface x50 feet x2  laps with min A for safety; She does exhibit decreased RLE ankle control and requires increased time for proper balance/safety. She reports moderate fatigue following gait on grass, stating, "I feel it all up my legs." ?Patient required wheelchair assist back to waiting room after walking outside due to LE fatigue;  ?  ?Pt educated throughout session about proper posture and technique with exercises. Improved exercise technique, movement at target joints, use of target muscles after min to mod verbal, visual, tactile cues. ?  ?Patient tolerated session fair. She does report increased fatigue at end of session. Reinforced HEP ?  ?  ?  ? ? ? ? ? ? ? ? ? ? ? ? ? ? ? ? ? ? ? ? ? ? ? ? PT Education - 07/16/21 1255   ? ?  Education Details exercise technique, balance/gait safety HEP reinforced;   ? Person(s) Educated Patient   ? Methods Explanation;Verbal cues   ? Comprehension Verbalized understanding;Returned demonstration;Verbal cues required;Need further instruction   ? ?  ?  ? ?  ? ? ? PT Short Term Goals - 06/25/21 1151   ? ?  ? PT SHORT TERM GOAL #1  ? Title Patient will be adherent to HEP at least 3x a week to improve functional strength and balance for better safety at home.   ? Baseline 9/21: Pt reports compliance with HEP and is confident, 1/3: doing exercise 2x a week; 06/25/21: 3x/week   ? Time 4   ? Period Weeks   ? Status Achieved   ? Target Date 06/25/21   ?  ? PT SHORT TERM GOAL #2  ? Title Patient will be independent in transferring sit<>Stand without pushing on arm rests to improve ability to get up from chair.   ? Baseline 9/21: pt able to complete STS without use of UEs, 1/3:able to stand but has moderate difficulty requiring increased time; 06/25/21: able to perform indep without hands but requiring use of momentum to attain standing. Remains stable.   ? Time 4   ? Period Weeks   ? Status Partially Met   ? Target Date 07/23/21   ? ?  ?  ? ?  ? ? ? ? PT Long Term Goals - 06/25/21 1154   ? ?  ? PT LONG TERM  GOAL #1  ? Title Patient (> 52 years old) will complete five times sit to stand test in < 15 seconds indicating an increased LE strength and improved balance.   ? Baseline 9/21: 29 seconds hands-free 10/

## 2021-07-21 ENCOUNTER — Encounter: Payer: Medicare HMO | Admitting: Occupational Therapy

## 2021-07-21 ENCOUNTER — Ambulatory Visit: Payer: Medicare HMO | Admitting: Physical Therapy

## 2021-07-21 ENCOUNTER — Ambulatory Visit: Payer: Medicare HMO

## 2021-07-23 ENCOUNTER — Ambulatory Visit: Payer: Medicare HMO | Admitting: Occupational Therapy

## 2021-07-23 ENCOUNTER — Ambulatory Visit: Payer: Medicare HMO | Admitting: Physical Therapy

## 2021-07-28 ENCOUNTER — Ambulatory Visit: Payer: Medicare HMO | Admitting: Physical Therapy

## 2021-07-28 ENCOUNTER — Encounter: Payer: Self-pay | Admitting: Physical Therapy

## 2021-07-28 ENCOUNTER — Ambulatory Visit: Payer: Medicare HMO | Attending: Vascular Surgery | Admitting: Occupational Therapy

## 2021-07-28 ENCOUNTER — Encounter: Payer: Self-pay | Admitting: Occupational Therapy

## 2021-07-28 DIAGNOSIS — M6281 Muscle weakness (generalized): Secondary | ICD-10-CM

## 2021-07-28 DIAGNOSIS — R269 Unspecified abnormalities of gait and mobility: Secondary | ICD-10-CM | POA: Diagnosis present

## 2021-07-28 DIAGNOSIS — R482 Apraxia: Secondary | ICD-10-CM | POA: Insufficient documentation

## 2021-07-28 DIAGNOSIS — R262 Difficulty in walking, not elsewhere classified: Secondary | ICD-10-CM

## 2021-07-28 DIAGNOSIS — R2689 Other abnormalities of gait and mobility: Secondary | ICD-10-CM | POA: Diagnosis present

## 2021-07-28 DIAGNOSIS — R278 Other lack of coordination: Secondary | ICD-10-CM | POA: Diagnosis present

## 2021-07-28 DIAGNOSIS — R2681 Unsteadiness on feet: Secondary | ICD-10-CM

## 2021-07-28 NOTE — Therapy (Signed)
[facility] ?[place], [place], [address] ?Phone: [phone]   Fax:  [phone] ? ?Occupational Therapy Treatment ? ?Patient Details  ?Name: Cassandra Schaefer ?MRN: [id number] ?Date of Birth: [birth year] ?Referring Provider (OT): Dr. Tracie Harrier ? ? ?Encounter Date: 07/28/2021 ? ? OT End of Session - 07/28/21 1438   ? ? Visit Number 106   ? Number of Visits 90   ? Date for OT Re-Evaluation 10/06/21   ? Authorization Time Period Reporting period starting 06/18/2020   ? OT Start Time 1435   ? OT Stop Time 1515   ? OT Time Calculation (min) 40 min   ? Activity Tolerance Patient tolerated treatment well   ? Behavior During Therapy [facility] for tasks assessed/performed   ? ?  ?  ? ?  ? ? ?Past Medical History:  ?Diagnosis Date  ? Anemia   ? Cough   ? RESOLVING, NO FEVER  ? Diabetes mellitus without complication ([place])   ? Gout   ? Hypertension   ? Hypothyroidism   ? PUD (peptic ulcer disease)   ? Stroke [facility])   ? Wears dentures   ? full upper, partial lower  ? ? ?Past Surgical History:  ?Procedure Laterality Date  ? AMPUTATION TOE Left 12/14/2017  ? Procedure: AMPUTATION TOE-4TH MPJ;  Surgeon: Samara Deist, DPM;  Location: [facility];  Service: Podiatry;  Laterality: Left;  IVA LOCAL ?Diabetic - oral meds  ? APPENDECTOMY    ? CATARACT EXTRACTION W/PHACO Right 06/23/2015  ? Procedure: CATARACT EXTRACTION PHACO AND INTRAOCULAR LENS PLACEMENT (IOC);  Surgeon: Estill Cotta, MD;  Location: [facility];  Service: Ophthalmology;  Laterality: Right;  [place] 01:28 ?AP% 23.4 ?CDE 36.14 ?fluid pack lot # [id number]  ? CATARACT EXTRACTION W/PHACO Left 07/28/2015  ? Procedure: CATARACT EXTRACTION PHACO AND INTRAOCULAR LENS PLACEMENT (IOC);  Surgeon: Estill Cotta, MD;  Location: [facility];  Service: Ophthalmology;  Laterality: Left;  [place]    1:29.7 ?AP%  24.9 ?CDE   41.32 ?fluid casette lot [id number] H exp 09/23/2016  ? COONOSCOPY    ? AND ENDOSCOPY  ? DILATION AND  CURETTAGE OF UTERUS    ? ENDARTERECTOMY Left 11/13/2020  ? Procedure: Evacuation of left neck hematoma;  Surgeon: Katha Cabal, MD;  Location: [facility];  Service: Vascular;  Laterality: Left;  ? ENDARTERECTOMY Left 11/13/2020  ? Procedure: ENDARTERECTOMY CAROTID;  Surgeon: Algernon Huxley, MD;  Location: [facility];  Service: Vascular;  Laterality: Left;  ? TONSILLECTOMY    ? TUBAL LIGATION    ? ? ?There were no vitals filed for this visit. ? ? Subjective Assessment - 07/28/21 1438   ? ? Subjective  Pt. reports being cold today.   ? Patient is accompanied by: Family member   ? Pertinent History R ankle pain, gout, T2DM, HTN, CKDIII; R/L carotid endarectomy   ? Currently in Pain? No/denies   ? ?  ?  ? ?  ? ?Neuro muscular re-education: ?  ?Pt. worked on [facility] skills grasping 1" sticks, 1/4" collars, and 1/4" washers. Pt. worked on storing the objects in the palm, and translatory skills moving the items from the palm of the hand to the tip of the 2nd digit, and thumb. Pt. worked on removing the pegs using bilateral alternating hand patterns. Pt. Worked on bilateral simultaneous hand movements. ?  ?Pt. reports that she has started doing crossword puzzles at home. Pt. reports that she has improved with printing within  the designated space of the crossword puzzle. Pt. continues to make progress overall with her right UE functioning, and right hand [facility] skills. Pt. continues to present with impaired right hand motor control, and [place]. Pt. continues to have difficulty performing translatory movements with the right hand, as well as bilateral simultaneous hand movements. Pt. continues to present with difficulty with [facility] when further challenged with dual tasking. Pt. continues to work on improving [facility] skills, motor control, and accuracy with reaching in order to work towards improving right hand function, and improving/maximizing independence with ADLs, and IADLs ?  ?  ? ? ? ? ? ? ? ? ? ? ? ? ? ? ? ? ? ? ? ? ? OT Education -  07/28/21 1438   ? ? Education Details RUE functioning.   ? Person(s) Educated Patient   ? Methods Explanation;Verbal cues;Demonstration;Tactile cues   ? Comprehension Verbalized understanding;Verbal cues required;Returned demonstration   ? ?  ?  ? ?  ? ? ? OT Short Term Goals - 03/23/21 1657   ? ?  ? OT SHORT TERM GOAL #1  ? Title Pt will perform HEP for RUE strength/coordination independently.   ? Baseline Eval: HEP not yet established in outpatient, but pt is working with putty from [name]; 12/04/2020; [facility] HEP initiated this day with mod vc after return demo; 01/05/2021: indep with currently program, but ongoing as pt progresses; 01/28/2021: vc to revisit and focus on grip and pinch strengthening with putty; 03/23/21: pt is indep with HEP   ? Time 6   ? Period Weeks   ? Status Achieved   ? Target Date 03/23/21   ? ?  ?  ? ?  ? ? ? ? OT Long Term Goals - 07/14/21 1120   ? ?  ? OT LONG TERM GOAL #1  ? Title Pt will improve hand writing to 100% legibility with R hand to be able to independently write a check.   ? Baseline Eval: signature is 75% legible with R hand, requiring extra time; 12/04/2020: no chan to complete.ge from eval; 01/05/2021: Printing is 100% legible, signature is 90% legible, but still requires extra time and effort.; 01/26/21: Signature is 90% legible and speed/fluidity have improved. 20th visit: 90% legible, 03/23/21: 90% legible, extra time 40th visit 90 % legibility with increased time, 50th visit: 90% legible with with increased time required 07/14/2021: 90% legibility.   ? Time 12   ? Period Weeks   ? Status On-going   ? Target Date 10/06/21   ?  ? OT LONG TERM GOAL #2  ? Title Pt will improve R hand coordination to enable good manipulation of iphone using R dominant hand   ? Baseline Eval: Pt uses L hand d/t R hand lacking sufficient [place] to press buttons on phone; 12/04/2020: no change from eval; 01/05/2021: R hand coordination is improving but pt still uses L hand to dial phone; 01/28/21: Pt using R  dominant hand to press buttons and apps on phone 50% of the time.20th visit: Right hand [facility] skilols are improving, Pt. is improving with manipulation of the iphone; 03/23/21: Pt now consistently using R hand to press buttons on phone with good accuracy.40th visit: Pt. continues to have difficulty modifying the accurate amount of pressure needed to wipe the phone. 50th visit: Pt. is improving, however has difficulty modulating the amount of pressure needed for swiping the phone, as pt. swipes too hard. 07/16/2021: Pt. is now able to manipulate buttons on the  Iphone to make calls.   ? Time 12   ? Period Weeks   ? Status Achieved   ?  ? OT LONG TERM GOAL #3  ? Title Pt will improve [place] throughout RUE to enable pt to reach for and pick up ADL supplies with R dominant hand without dropping or knocking over objects.   ? Baseline Eval: Pt reports RUE is clumsy and easily knocks over objects when trying to reach for ADL supplies.  Pt verbalizes that her "aim is off."; 12/04/2020: pt reports slight improvement since eval and has started using her R hand to eat, but still feels clumsy; 01/05/2021: Greatly improved; pt is using silverware to eat with R hand, can comb hair with RUE, still mildly ataxic; 01/26/21: pt reports difficulty picking up objects within a small space (ie; may knock the toothpaste holder down when reaching for toothbrush), but reaching for light/larger objects is improving 20th visit: pt. continues to be mildly ataxic, however is consistently improving with motor control, and [place] while reaching targets; 03/23/21: Pt reaches with accuracy towards targets if she takes her time (pt drops and knocks things over when moving at a faster/normal pace) 04/23/2021: Pt. continues to present with difficulty with depth perception, and impaired [place].40th visit: Pt. is improving, however tends to knock over lighter weight objects.50th visit: Pt. has improved with reaching for items without knocking them over, however  continues to need work on accuracy.07/14/2021: Pt. is improving while motor control, and accuracy of reaching for objects. Pt. continues to work towards improvving efficiency with reaching for objects.   ? Time 12   ? [place]

## 2021-07-28 NOTE — Therapy (Signed)
[place] [facility] ?[place], [place], [address] ?Phone: [phone]   Fax:  [phone] ? ?Physical Therapy Treatment ? ?Patient Details  ?Name: Cassandra Schaefer ?MRN: [id number] ?Date of Birth: 11-Aug-1928 ?Referring Provider (PT): Dr. Dew/Dr. Ginette Pitman PCP ? ? ?Encounter Date: 07/28/2021 ? ? PT End of Session - 07/28/21 1436   ? ? Visit Number [id number]   ? Number of Visits 70   ? Date for PT Re-Evaluation 08/20/21   ? Authorization Type [employer]   ? PT Start Time 1347   ? PT Stop Time [id number]   ? PT Time Calculation (min) 45 min   ? Equipment Utilized During Treatment Gait belt   ? Activity Tolerance Patient tolerated treatment well   ? Behavior During Therapy [facility] for tasks assessed/performed   ? ?  ?  ? ?  ? ? ?Past Medical History:  ?Diagnosis Date  ? Anemia   ? Cough   ? RESOLVING, NO FEVER  ? Diabetes mellitus without complication ([place])   ? Gout   ? Hypertension   ? Hypothyroidism   ? PUD (peptic ulcer disease)   ? Stroke [facility])   ? Wears dentures   ? full upper, partial lower  ? ? ?Past Surgical History:  ?Procedure Laterality Date  ? AMPUTATION TOE Left 12/14/2017  ? Procedure: AMPUTATION TOE-4TH MPJ;  Surgeon: Samara Deist, DPM;  Location: [place];  Service: Podiatry;  Laterality: Left;  IVA LOCAL ?Diabetic - oral meds  ? APPENDECTOMY    ? CATARACT EXTRACTION W/PHACO Right 06/23/2015  ? Procedure: CATARACT EXTRACTION PHACO AND INTRAOCULAR LENS PLACEMENT (IOC);  Surgeon: Estill Cotta, MD;  Location: [facility];  Service: Ophthalmology;  Laterality: Right;  [place] 01:28 ?AP% 23.4 ?CDE 36.14 ?fluid pack lot # [id number]  ? CATARACT EXTRACTION W/PHACO Left 07/28/2015  ? Procedure: CATARACT EXTRACTION PHACO AND INTRAOCULAR LENS PLACEMENT (IOC);  Surgeon: Estill Cotta, MD;  Location: [facility];  Service: Ophthalmology;  Laterality: Left;  [place]    1:29.7 ?AP%  24.9 ?CDE   41.32 ?fluid casette lot [id number] exp 09/23/2016  ? COONOSCOPY    ? AND ENDOSCOPY  ?  DILATION AND CURETTAGE OF UTERUS    ? ENDARTERECTOMY Left 11/13/2020  ? Procedure: Evacuation of left neck hematoma;  Surgeon: Katha Cabal, MD;  Location: [facility];  Service: Vascular;  Laterality: Left;  ? ENDARTERECTOMY Left 11/13/2020  ? Procedure: ENDARTERECTOMY CAROTID;  Surgeon: Algernon Huxley, MD;  Location: [facility];  Service: Vascular;  Laterality: Left;  ? TONSILLECTOMY    ? TUBAL LIGATION    ? ? ?There were no vitals filed for this visit. ? ? Subjective Assessment - 07/28/21 1355   ? ? Subjective Patient reports doing well. No new falls or stumbles. No pain reported. She did have follow up with MD with good report.   ? Patient is accompained by: --   Daughter, [name]  ? Pertinent History 88 y.o. female with medical history significant for type 2 diabetes mellitus, essential hypertension, acquired hypothyroidism, hyperlipidemia, stage III chronic kidney disease reports increased right sided weakness on 10/19/20. She was diagnosed with left basal ganglia ischemic stroke. She was discharged to inpatient rehab for 10 days and then discharged home. Patient was evaluated by outpatient PT on 11/12/20. CVA was due to carotid stenosis. She underwent left carotid endartectomy on 7/21. Procedure went well however patient suffered from left cervical hematoma and had subsequent surgical procedure to stop the bleed  and remove the hematoma on 11/13/20. They were concerned with her breathing and therefore intubated her for 1 day, she was in ICU for 2 days and transferred to med/surge on 11/15/20. Patient was discharged home on 11/17/20 and is now returning to outpatient PT. She has had the stiches removed and is healing well. Denies any neck pain or stiffness. She lives with her daughter and has 24/7 caregiver support. She is still using RW for most ambulation. She is able to transfer to bedside commode well and is able to stand short time unsupported. She reports feeling very weak and fatigued. She reports she has been  dragging her right foot more. She denies any numbness/tingling; She reports feeling like her legs are wanting to do more but is hesitant because of fear of falling. She is mod I for self care morning routine. She does still receive help with showering. She has been working on increasing her activity but denies any formal exercise.   ? Limitations Standing;Walking   ? How long can you sit comfortably? NA   ? How long can you stand comfortably? 30-60 min with support   ? How long can you walk comfortably? about 100 feet with RW, still limited to short distances;   ? Diagnostic tests MRI shows left basal ganglia infarct 10/21/20   ? Patient Stated Goals "Improve balance and walking and not need RW, get back to independent."   ? Currently in Pain? No/denies   ? Pain Onset In the past 7 days   ? ?  ?  ? ?  ? ? ? ? ? ?  ?  ?TREATMENT: ?Warm up on Nustep, BUE/BLE level 2-4 (interval training) x4 min with cues for steps per minute >60 to challenge cardiovascular endurance; ? ?Standing in parallel bars: ?Ex: ?Forward walking over ladder, reciprocal steps high knee march x 4 laps with 2# ankle weight, then backward walking , required cues for increased step length and improve heel strike for better foot clearance; Patient does require intermittent rail assist with increased instability with less rail assist, requiring CGA for safety;  ?Side stepping over ladder 2# on BLE x2 lap each direction with 1 rail assist; does exhibit increased RLE foot drag with increased repetition;  ?NMR: ?Forward step with contralateral LE SLS 3 sec hold x2 laps without rail assist with min A for safety, reports moderate difficulty;  ?  ? ?Standing in parallel bars: ?Stepping to stepping stone #3 with single leg x5 reps each LE, moderate difficulty stepping with RLE due to incoordination/apraxia ? Progressed to stepping with RLE in various directions x1-2 min, min A  ?Patient unsupported requiring min A for safety with multiple episodes of  imbalance; ? ?Standing on 1/2 bolster (round side up): ?-BLE heel raises x15 reps ?-BLE toe raises x15 reps ?Required BUE rail assist for safety ?Required cues to increase ROM for better ankle ROM for improved ankle strategies;  ? ?Standing on 1/2 bolster (flat side up):  ?Tandem stance unsupported 15 sec hold ?Progressed to head turns side/side x5 reps ?Required CGA to min A for safety ? ?Gait outside parallel bars: ?Weaving around cones #5 x2 sets with CGA for safety and cues to increase RLE heel strike for better foot clearance ? ?Gait from PT to OT gym x125 feet, unsupported, CGA to min A for safety and cues for increased RLE heel strike; She exhibits better positioning/balance but does occasionally drag RLE foot;  ?  ?Pt educated throughout session about proper posture and  technique with exercises. Improved exercise technique, movement at target joints, use of target muscles after min to mod verbal, visual, tactile cues. ?  ?Patient tolerated session fair. She does report increased fatigue at end of session. Reinforced HEP ?  ?  ?  ?  ?  ?  ? ? ? ? ? ? ? ? ? ? ? ? ? ? ? ? ? ? ? ? ? ? PT Education - 07/28/21 1435   ? ? Education Details exercise technique/balance/gait safety;   ? Person(s) Educated Patient   ? Methods Explanation;Verbal cues   ? Comprehension Verbalized understanding;Returned demonstration;Verbal cues required;Need further instruction   ? ?  ?  ? ?  ? ? ? PT Short Term Goals - 06/25/21 1151   ? ?  ? PT SHORT TERM GOAL #1  ? Title Patient will be adherent to HEP at least 3x a week to improve functional strength and balance for better safety at home.   ? Baseline 9/21: Pt reports compliance with HEP and is confident, 1/3: doing exercise 2x a week; 06/25/21: 3x/week   ? Time 4   ? Period Weeks   ? Status Achieved   ? Target Date 06/25/21   ?  ? PT SHORT TERM GOAL #2  ? Title Patient will be independent in transferring sit<>Stand without pushing on arm rests to improve ability to get up from chair.    ? Baseline 9/21: pt able to complete STS without use of UEs, 1/3:able to stand but has moderate difficulty requiring increased time; 06/25/21: able to perform indep without hands but requiring use of momentum to a

## 2021-07-30 ENCOUNTER — Ambulatory Visit: Payer: Medicare HMO | Admitting: Occupational Therapy

## 2021-07-30 ENCOUNTER — Ambulatory Visit: Payer: Medicare HMO | Admitting: Physical Therapy

## 2021-07-30 ENCOUNTER — Encounter: Payer: Self-pay | Admitting: Occupational Therapy

## 2021-07-30 DIAGNOSIS — R262 Difficulty in walking, not elsewhere classified: Secondary | ICD-10-CM

## 2021-07-30 DIAGNOSIS — R2689 Other abnormalities of gait and mobility: Secondary | ICD-10-CM

## 2021-07-30 DIAGNOSIS — M6281 Muscle weakness (generalized): Secondary | ICD-10-CM

## 2021-07-30 DIAGNOSIS — R2681 Unsteadiness on feet: Secondary | ICD-10-CM

## 2021-07-30 DIAGNOSIS — R278 Other lack of coordination: Secondary | ICD-10-CM

## 2021-07-30 DIAGNOSIS — R269 Unspecified abnormalities of gait and mobility: Secondary | ICD-10-CM

## 2021-07-30 NOTE — Therapy (Signed)
[place] [facility] ?[address] ?[place], [place], [address] ?Phone: [phone]   Fax:  [phone] ? ?Occupational Therapy Progress Note ? ?Dates of reporting period  06/18/2021   to   07/30/2021  ? ?Patient Details  ?Name: Cassandra Schaefer ?MRN: [id number] ?Date of Birth: 22-May-1928 ?Referring Provider (OT): Dr. Barbette Reichmann ? ? ?Encounter Date: 07/30/2021 ? ? OT End of Session - 07/30/21 1307   ? ? Visit Number 60   ? Number of Visits 90   ? Date for OT Re-Evaluation 10/06/21   ? Authorization Time Period Reporting period starting 06/18/2020   ? OT Start Time 1303   ? OT Stop Time 1345   ? OT Time Calculation (min) 42 min   ? Activity Tolerance Patient tolerated treatment well   ? Behavior During Therapy [facility] for tasks assessed/performed   ? ?  ?  ? ?  ? ? ?Past Medical History:  ?Diagnosis Date  ? Anemia   ? Cough   ? RESOLVING, NO FEVER  ? Diabetes mellitus without complication ([facility])   ? Gout   ? Hypertension   ? Hypothyroidism   ? PUD (peptic ulcer disease)   ? Stroke [facility])   ? Wears dentures   ? full upper, partial lower  ? ? ?Past Surgical History:  ?Procedure Laterality Date  ? AMPUTATION TOE Left 12/14/2017  ? Procedure: AMPUTATION TOE-4TH MPJ;  Surgeon: Gwyneth Revels, DPM;  Location: [facility];  Service: Podiatry;  Laterality: Left;  IVA LOCAL ?Diabetic - oral meds  ? APPENDECTOMY    ? CATARACT EXTRACTION W/PHACO Right 06/23/2015  ? Procedure: CATARACT EXTRACTION PHACO AND INTRAOCULAR LENS PLACEMENT (IOC);  Surgeon: Sallee Lange, MD;  Location: [facility];  Service: Ophthalmology;  Laterality: Right;  [place] 01:28 ?AP% 23.4 ?CDE 36.14 ?fluid pack lot # [id number]  ? CATARACT EXTRACTION W/PHACO Left 07/28/2015  ? Procedure: CATARACT EXTRACTION PHACO AND INTRAOCULAR LENS PLACEMENT (IOC);  Surgeon: Sallee Lange, MD;  Location: [facility];  Service: Ophthalmology;  Laterality: Left;  [place]    1:29.7 ?AP%  24.9 ?CDE   41.32 ?fluid casette lot #[id number] H exp  09/23/2016  ? COONOSCOPY    ? AND ENDOSCOPY  ? DILATION AND CURETTAGE OF UTERUS    ? ENDARTERECTOMY Left 11/13/2020  ? Procedure: Evacuation of left neck hematoma;  Surgeon: Renford Dills, MD;  Location: [facility];  Service: Vascular;  Laterality: Left;  ? ENDARTERECTOMY Left 11/13/2020  ? Procedure: ENDARTERECTOMY CAROTID;  Surgeon: Annice Needy, MD;  Location: [facility];  Service: Vascular;  Laterality: Left;  ? TONSILLECTOMY    ? TUBAL LIGATION    ? ? ?There were no vitals filed for this visit. ? ? Subjective Assessment - 07/30/21 1307   ? ? Subjective  Pt. reports being cold today.   ? Patient is accompanied by: Family member   ? Pertinent History R ankle pain, gout, T2DM, HTN, CKDIII; R/L carotid endarectomy   ? Currently in Pain? No/denies   ? ?  ?  ? ?  ? ? ? ? ? [facility] OT Assessment - 07/30/21 1309   ? ?  ? Coordination  ? Right 9 Hole Peg Test 57   ?  ? Hand Function  ? Right Hand Grip (lbs) 32   ? Right Hand Lateral Pinch 13 lbs   ? Right Hand 3 Point Pinch 11 lbs   ? ?  ?  ? ?  ? ?Neuromuscular re-education:  ? ?  Pt. worked on right hand [facility] skills grasping small beads to simulate medication/pills. Pt. worked on grasping the beads to remove them from the bottles, and dropping them into a pillbox. Pt. worked on removing the simulated medication/pills from the pillbox. Pt. had more difficulty removing the beads from the pillbox.  ? ?Measurements were obtained, and goals were reviewed with the pt. Pt. Has made progress with [facility] skills. Pt. Is now using her right hand to apply hairspray, perform toilet hygiene care, peel potatoes, fasten her bra, feed herself, and use her arms to move, and push up in bed. Pt. Continues to present with limited right hand FMC, and motor control skills. Pt. Has difficulty holding pens, and pencils with too much pressure when writing. Pt. Continues to have difficulty with manipulating pills when putting when placing them into, and taking them out of a pillbox. Pt. Continues to work  on improving right hand [facility] skills in order to work towards improving right hand function, manipulating small items, and maximizing independence with ADLs, and IADL tasks. ? ? ? ? ? ? ? ? ? ? ? ? ? ? ? OT Education - 07/30/21 1307   ? ? Education Details RUE functioning.   ? Person(s) Educated Patient   ? Methods Explanation;Verbal cues;Demonstration;Tactile cues   ? Comprehension Verbalized understanding;Verbal cues required;Returned demonstration   ? ?  ?  ? ?  ? ? ? OT Short Term Goals - 03/23/21 1657   ? ?  ? OT SHORT TERM GOAL #1  ? Title Pt will perform HEP for RUE strength/coordination independently.   ? Baseline Eval: HEP not yet established in outpatient, but pt is working with putty from [name]; 12/04/2020; [facility] HEP initiated this day with mod vc after return demo; 01/05/2021: indep with currently program, but ongoing as pt progresses; 01/28/2021: vc to revisit and focus on grip and pinch strengthening with putty; 03/23/21: pt is indep with HEP   ? Time 6   ? Period Weeks   ? Status Achieved   ? Target Date 03/23/21   ? ?  ?  ? ?  ? ? ? ? OT Long Term Goals - 07/30/21 1320   ? ?  ? OT LONG TERM GOAL #1  ? Title Pt will improve hand writing to 100% legibility with R hand to be able to independently write a check.   ? Baseline Eval: signature is 75% legible with R hand, requiring extra time; 12/04/2020: no chan to complete.ge from eval; 01/05/2021: Printing is 100% legible, signature is 90% legible, but still requires extra time and effort.; 01/26/21: Signature is 90% legible and speed/fluidity have improved. 20th visit: 90% legible, 03/23/21: 90% legible, extra time 40th visit 90 % legibility with increased time, 50th visit: 90% legible with with increased time required 07/14/2021: 90% legibility. 07/30/2021: 90% legibility, pt. presses too hard through the pen, and pencil.   ? Time 12   ? Period Weeks   ? Status On-going   ? Target Date 10/06/21   ?  ? OT LONG TERM GOAL #3  ? Title Pt will improve GMC throughout RUE  to enable pt to reach for and pick up ADL supplies with R dominant hand without dropping or knocking over objects.   ? Baseline Eval: Pt reports RUE is clumsy and easily knocks over objects when trying to reach for ADL supplies.  Pt verbalizes that her "aim is off."; 12/04/2020: pt reports slight improvement since eval and has started using her R  hand to eat, but still feels clumsy; 01/05/2021: Greatly improved; pt is using silverware to eat with R hand, can comb hair with RUE, still mildly ataxic; 01/26/21: pt reports difficulty picking up objects within a small space (ie; may knock the toothpaste holder down when reaching for toothbrush), but reaching for light/larger objects is improving 20th visit: pt. continues to be mildly ataxic, however is consistently improving with motor control, and FMC while reaching targets; 03/23/21: Pt reaches with accuracy towards targets if she takes her time (pt drops and knocks things over when moving at a faster/normal pace) 04/23/2021: Pt. continues to present with difficulty with depth perception, and impaired FMC.40th visit: Pt. is improving, however tends to knock over lighter weight objects.50th visit: Pt. has improved with reaching for items without knocking them over, however continues to need work on accuracy.07/14/2021: Pt. is improving while motor control, and accuracy of reaching for objects. Pt. continues to work towards improving efficiency with reaching for objects. 07/30/2021: Pt. is able to able to reach for objects more accurately, however continues to present with limited motor control in the Right.   ? Time 12   ? Period Weeks   ? Status On-going   ? Target Date 10/06/21   ?  ? OT LONG TERM GOAL #4  ? Title Pt will improve FOTO score to 68 or better to indicate measurable functional improvement.   ? Baseline Eval: FOTO 55; 01/05/2021: FOTO 61; 01/28/21: FOTO 61, 20th visit: 61; 03/23/21: FOTO 60 4oth visit: FOTO score: 61, 50th visit: FOTO 69 07/30/2021: FOTO 71   ?  Time 12   ? Period Weeks   ? Status On-going   ? Target Date 10/06/21   ?  ? OT LONG TERM GOAL #6  ? Title Pt. will improve right hand [facility] skills to be able to indepedendently manipulate and set-up her med

## 2021-07-30 NOTE — Therapy (Signed)
[place] ?[facility] ?[address] ?[place], [place], [address] ?Phone: [phone]   Fax:  [phone] ? ?Physical Therapy Treatment ? ?Patient Details  ?Name: Theora Ann Poplin ?MRN: [id number] ?Date of Birth: 06/27/1928 ?Referring Provider (PT): Dr. Dew/Dr. Hande PCP ? ? ?Encounter Date: 07/30/2021 ? ? PT End of Session - 07/30/21 1350   ? ? Visit Number [id number]   ? Number of Visits 70   ? Date for PT Re-Evaluation 08/20/21   ? Authorization Type [employer]   ? PT Start Time 1348   ? PT Stop Time 1432   ? PT Time Calculation (min) 44 min   ? Equipment Utilized During Treatment Gait belt   ? Activity Tolerance Patient tolerated treatment well   ? Behavior During Therapy WFL for tasks assessed/performed   ? ?  ?  ? ?  ? ? ?Past Medical History:  ?Diagnosis Date  ? Anemia   ? Cough   ? RESOLVING, NO FEVER  ? Diabetes mellitus without complication (HCC)   ? Gout   ? Hypertension   ? Hypothyroidism   ? PUD (peptic ulcer disease)   ? Stroke (HCC)   ? Wears dentures   ? full upper, partial lower  ? ? ?Past Surgical History:  ?Procedure Laterality Date  ? AMPUTATION TOE Left 12/14/2017  ? Procedure: AMPUTATION TOE-4TH MPJ;  Surgeon: Fowler, Justin, DPM;  Location: [facility];  Service: Podiatry;  Laterality: Left;  IVA LOCAL ?Diabetic - oral meds  ? APPENDECTOMY    ? CATARACT EXTRACTION W/PHACO Right 06/23/2015  ? Procedure: CATARACT EXTRACTION PHACO AND INTRAOCULAR LENS PLACEMENT (IOC);  Surgeon: Steven Dingeldein, MD;  Location: [facility];  Service: Ophthalmology;  Laterality: Right;  US 01:28 ?AP% 23.4 ?CDE 36.14 ?fluid pack lot # [id number]  ? CATARACT EXTRACTION W/PHACO Left 07/28/2015  ? Procedure: CATARACT EXTRACTION PHACO AND INTRAOCULAR LENS PLACEMENT (IOC);  Surgeon: Steven Dingeldein, MD;  Location: [facility];  Service: Ophthalmology;  Laterality: Left;  US    1:29.7 ?AP%  24.9 ?CDE   41.32 ?fluid casette lot #[id number] exp 09/23/2016  ? COONOSCOPY    ? AND ENDOSCOPY  ?  DILATION AND CURETTAGE OF UTERUS    ? ENDARTERECTOMY Left 11/13/2020  ? Procedure: Evacuation of left neck hematoma;  Surgeon: Schnier, Gregory G, MD;  Location: [facility];  Service: Vascular;  Laterality: Left;  ? ENDARTERECTOMY Left 11/13/2020  ? Procedure: ENDARTERECTOMY CAROTID;  Surgeon: Dew, Jason S, MD;  Location: [facility];  Service: Vascular;  Laterality: Left;  ? TONSILLECTOMY    ? TUBAL LIGATION    ? ? ?There were no vitals filed for this visit. ? ? Subjective Assessment - 07/30/21 1352   ? ? Subjective Pt reports no falls and has been practicing at home with walking with "surfing" along the walls.  No pain reported at this time.   ? Patient is accompained by: --   Daughter, [name]  ? Pertinent History 88 y.o. female with medical history significant for type 2 diabetes mellitus, essential hypertension, acquired hypothyroidism, hyperlipidemia, stage III chronic kidney disease reports increased right sided weakness on 10/19/20. She was diagnosed with left basal ganglia ischemic stroke. She was discharged to inpatient rehab for 10 days and then discharged home. Patient was evaluated by outpatient PT on 11/12/20. CVA was due to carotid stenosis. She underwent left carotid endartectomy on 7/21. Procedure went well however patient suffered from left cervical hematoma and had subsequent surgical procedure to stop   the bleed and remove the hematoma on 11/13/20. They were concerned with her breathing and therefore intubated her for 1 day, she was in ICU for 2 days and transferred to med/surge on 11/15/20. Patient was discharged home on 11/17/20 and is now returning to outpatient PT. She has had the stiches removed and is healing well. Denies any neck pain or stiffness. She lives with her daughter and has 24/7 caregiver support. She is still using RW for most ambulation. She is able to transfer to bedside commode well and is able to stand short time unsupported. She reports feeling very weak and fatigued. She reports she has  been dragging her right foot more. She denies any numbness/tingling; She reports feeling like her legs are wanting to do more but is hesitant because of fear of falling. She is mod I for self care morning routine. She does still receive help with showering. She has been working on increasing her activity but denies any formal exercise.   ? Limitations Standing;Walking   ? How long can you sit comfortably? NA   ? How long can you stand comfortably? 30-60 min with support   ? How long can you walk comfortably? about 100 feet with RW, still limited to short distances;   ? Diagnostic tests MRI shows left basal ganglia infarct 10/21/20   ? Patient Stated Goals "Improve balance and walking and not need RW, get back to independent."   ? Pain Onset In the past 7 days   ? ?  ?  ? ?  ? ? ? ? ? ? ? ? ? ?TREATMENT: ? ?Standing at support bar: ? ?Ex: ?Standing hip abduction, 2x10 ?Standing hip extension, 2x10 ?Standing marches 2x10 each LE ?Standing calf raises, 2x10  ?Standing toe raises, 2x10 ?Stair training with focus on eccentric contractions coming down the stairs, x4 ? ?NMR: ?Forward step with contralateral LE SLS 3 sec hold x2 laps without rail assist with min A for safety, reports moderate difficulty;  ? ?Standing on 1/2 bolster (round side up): ?BLE heel raises x15 reps ?BLE toe raises x15 reps ?Required L UE rail assist for safety ?Required cues to increase ROM for better ankle ROM for improved ankle strategies;  ?  ?Standing on 1/2 bolster (flat side up):  ?Tandem stance unsupported 15 sec hold ?Progressed to head turns side/side x5 reps ?Required CGA to min A for safety ? ?Gait outside support bar: ?Ambulation in hallway with head turns and symbol call-outs for challenge with balance, x4 laps; occasional dragging of the R foot due to fatigue, but with verbal cuing is able to correct ? ?Gait from PT to OT gym x125 feet, unsupported, CGA to min A for safety and cues for increased RLE heel strike; She exhibits better  positioning/balance but does occasionally drag RLE foot;  ?  ?Pt educated throughout session about proper posture and technique with exercises. Improved exercise technique, movement at target joints, use of target muscles after min to mod verbal, visual, tactile cues. ? ? ? ? ? ? PT Short Term Goals - 06/25/21 1151   ? ?  ? PT SHORT TERM GOAL #1  ? Title Patient will be adherent to HEP at least 3x a week to improve functional strength and balance for better safety at home.   ? Baseline 9/21: Pt reports compliance with HEP and is confident, 1/3: doing exercise 2x a week; 06/25/21: 3x/week   ? Time 4   ? Period Weeks   ? Status Achieved   ?  Target Date 06/25/21   ?  ? PT SHORT TERM GOAL #2  ? Title Patient will be independent in transferring sit<>Stand without pushing on arm rests to improve ability to get up from chair.   ? Baseline 9/21: pt able to complete STS without use of UEs, 1/3:able to stand but has moderate difficulty requiring increased time; 06/25/21: able to perform indep without hands but requiring use of momentum to attain standing. Remains stable.   ? Time 4   ? Period Weeks   ? Status Partially Met   ? Target Date 07/23/21   ? ?  ?  ? ?  ? ? ? ? PT Long Term Goals - 06/25/21 1154   ? ?  ? PT LONG TERM GOAL #1  ? Title Patient (> 60 years old) will complete five times sit to stand test in < 15 seconds indicating an increased LE strength and improved balance.   ? Baseline 9/21: 29 seconds hands-free 10/10: deferred 10/12: 34 sec hands free; 11/2: 23.8 sec hands-free; 12/7: 33.2 sec hands free, 1/3: 33.92 hands free; 06/25/21: 25.69 sec   ? Time 8   ? Period Weeks   ? Status On-going   ? Target Date 08/20/21   ?  ? PT LONG TERM GOAL #2  ? Title Patient will increase six minute walk test distance to >400 feet for improve gait ability   ? Baseline 9/21: 368 ft with RW; 10/10: deferred 10/12: 408 ft; 11/2: 476 ft with 4WW, 1/3: 475 feet with RW;   ? Time 8   ? Period Weeks   ? Status Achieved   ? Target Date  06/23/21   ?  ? PT LONG TERM GOAL #3  ? Title Patient will increase 10 meter walk test to >1.0m/s as to improve gait speed for better community ambulation and to reduce fall risk.   ? Baseline 9/21: 0.5 m/s

## 2021-08-04 ENCOUNTER — Ambulatory Visit: Payer: Medicare HMO | Admitting: Occupational Therapy

## 2021-08-04 ENCOUNTER — Ambulatory Visit: Payer: Medicare HMO | Admitting: Physical Therapy

## 2021-08-04 ENCOUNTER — Encounter: Payer: Self-pay | Admitting: Occupational Therapy

## 2021-08-04 ENCOUNTER — Encounter: Payer: Self-pay | Admitting: Physical Therapy

## 2021-08-04 DIAGNOSIS — M6281 Muscle weakness (generalized): Secondary | ICD-10-CM

## 2021-08-04 DIAGNOSIS — R269 Unspecified abnormalities of gait and mobility: Secondary | ICD-10-CM

## 2021-08-04 DIAGNOSIS — R482 Apraxia: Secondary | ICD-10-CM

## 2021-08-04 DIAGNOSIS — R2681 Unsteadiness on feet: Secondary | ICD-10-CM

## 2021-08-04 DIAGNOSIS — R278 Other lack of coordination: Secondary | ICD-10-CM

## 2021-08-04 DIAGNOSIS — R2689 Other abnormalities of gait and mobility: Secondary | ICD-10-CM

## 2021-08-04 DIAGNOSIS — R262 Difficulty in walking, not elsewhere classified: Secondary | ICD-10-CM

## 2021-08-04 NOTE — Therapy (Signed)
[place] ?[facility] ?[place], [place], [address] ?Phone: [phone]   Fax:  [phone] ? ?Occupational Therapy Treatment ? ?Patient Details  ?Name: Cassandra Schaefer ?MRN: [id number] ?Date of Birth: Oct 01, 1928 ?Referring Provider (OT): Dr. Tracie Harrier ? ? ?Encounter Date: 08/04/2021 ? ? OT End of Session - 08/04/21 1438   ? ? Visit Number [id number]   ? Number of Visits 90   ? Date for OT Re-Evaluation 10/06/21   ? Authorization Time Period Reporting period starting 06/18/2020   ? OT Start Time 1435   ? OT Stop Time 1515   ? OT Time Calculation (min) 40 min   ? Activity Tolerance Patient tolerated treatment well   ? Behavior During Therapy [facility] for tasks assessed/performed   ? ?  ?  ? ?  ? ? ?Past Medical History:  ?Diagnosis Date  ? Anemia   ? Cough   ? RESOLVING, NO FEVER  ? Diabetes mellitus without complication ([place])   ? Gout   ? Hypertension   ? Hypothyroidism   ? PUD (peptic ulcer disease)   ? Stroke [facility])   ? Wears dentures   ? full upper, partial lower  ? ? ?Past Surgical History:  ?Procedure Laterality Date  ? AMPUTATION TOE Left 12/14/2017  ? Procedure: AMPUTATION TOE-4TH MPJ;  Surgeon: Samara Deist, DPM;  Location: [place];  Service: Podiatry;  Laterality: Left;  IVA LOCAL ?Diabetic - oral meds  ? APPENDECTOMY    ? CATARACT EXTRACTION W/PHACO Right 06/23/2015  ? Procedure: CATARACT EXTRACTION PHACO AND INTRAOCULAR LENS PLACEMENT (IOC);  Surgeon: Estill Cotta, MD;  Location: [facility];  Service: Ophthalmology;  Laterality: Right;  [place] 01:28 ?AP% 23.4 ?CDE 36.14 ?fluid pack lot # [id number]  ? CATARACT EXTRACTION W/PHACO Left 07/28/2015  ? Procedure: CATARACT EXTRACTION PHACO AND INTRAOCULAR LENS PLACEMENT (IOC);  Surgeon: Estill Cotta, MD;  Location: [facility];  Service: Ophthalmology;  Laterality: Left;  [place]    1:29.7 ?AP%  24.9 ?CDE   41.32 ?fluid casette lot [id number] exp 09/23/2016  ? COONOSCOPY    ? AND ENDOSCOPY  ? DILATION AND  CURETTAGE OF UTERUS    ? ENDARTERECTOMY Left 11/13/2020  ? Procedure: Evacuation of left neck hematoma;  Surgeon: Katha Cabal, MD;  Location: [facility];  Service: Vascular;  Laterality: Left;  ? ENDARTERECTOMY Left 11/13/2020  ? Procedure: ENDARTERECTOMY CAROTID;  Surgeon: Algernon Huxley, MD;  Location: [facility];  Service: Vascular;  Laterality: Left;  ? TONSILLECTOMY    ? TUBAL LIGATION    ? ? ?There were no vitals filed for this visit. ? ? Subjective Assessment - 08/04/21 1438   ? ? Subjective  Pt. reports being cold today.   ? Patient is accompanied by: Family member   ? Pertinent History R ankle pain, gout, T2DM, HTN, CKDIII; R/L carotid endarectomy   ? Currently in Pain? No/denies   ? ?  ?  ? ?  ? ?Neuromuscular re-education:  ?  ?Pt. worked on right hand [facility] skills grasping small beads to simulate medication/pills. Pt. worked on grasping the beads to remove them from the bottles, and dropping them into a pillbox. Pt. worked on removing the simulated medication/pills from the pillbox. Pt. had more difficulty removing the beads from the pillbox. Pt. worked on right hand [facility] skills grasping 1" sticks, 1/4" collars, and 1/4" washers. Pt. worked on storing the objects in the palm, and translatory skills moving the items from the  palm of the hand to the tip of the 2nd digit, and thumb. Pt. worked on removing the pegs using bilateral alternating hand patterns. ? ?Pt. is improving with right hand [facility] skills, and grasping smaller objects one at a time. Pt. presents with increased difficulty grasping multiple objects at a time, storing small objects, and performing translatory movements. Pt. was able to follow the directions on the simulated medication management form. Pt. was able to remove the simulated medication, however had more difficulty grasping, and removing them from the pillbox, as well as increased time to open the pillbox. Pt. Continues to work on improving right hand function, and [facility] skills in order to  work towards improving, and maximizing independence with ADLs,  and IADL tasks.  ?  ?  ?  ? ? ? ? ? ? ? ? ? ? ? ? ? ? ? ? ? ? ? ? ? OT Education - 08/04/21 1438   ? ? Education Details RUE functioning.   ? Person(s) Educated Patient   ? Methods Explanation;Verbal cues;Demonstration;Tactile cues   ? Comprehension Verbalized understanding;Verbal cues required;Returned demonstration   ? ?  ?  ? ?  ? ? ? OT Short Term Goals - 03/23/21 1657   ? ?  ? OT SHORT TERM GOAL #1  ? Title Pt will perform HEP for RUE strength/coordination independently.   ? Baseline Eval: HEP not yet established in outpatient, but pt is working with putty from [name]; 12/04/2020; [facility] HEP initiated this day with mod vc after return demo; 01/05/2021: indep with currently program, but ongoing as pt progresses; 01/28/2021: vc to revisit and focus on grip and pinch strengthening with putty; 03/23/21: pt is indep with HEP   ? Time 6   ? Period Weeks   ? Status Achieved   ? Target Date 03/23/21   ? ?  ?  ? ?  ? ? ? ? OT Long Term Goals - 07/30/21 1320   ? ?  ? OT LONG TERM GOAL #1  ? Title Pt will improve hand writing to 100% legibility with R hand to be able to independently write a check.   ? Baseline Eval: signature is 75% legible with R hand, requiring extra time; 12/04/2020: no chan to complete.ge from eval; 01/05/2021: Printing is 100% legible, signature is 90% legible, but still requires extra time and effort.; 01/26/21: Signature is 90% legible and speed/fluidity have improved. 20th visit: 90% legible, 03/23/21: 90% legible, extra time 40th visit 90 % legibility with increased time, 50th visit: 90% legible with with increased time required 07/14/2021: 90% legibility. 07/30/2021: 90% legibility, pt. presses too hard through the pen, and pencil.   ? Time 12   ? Period Weeks   ? Status On-going   ? Target Date 10/06/21   ?  ? OT LONG TERM GOAL #3  ? Title Pt will improve [place] throughout RUE to enable pt to reach for and pick up ADL supplies with R dominant hand  without dropping or knocking over objects.   ? Baseline Eval: Pt reports RUE is clumsy and easily knocks over objects when trying to reach for ADL supplies.  Pt verbalizes that her "aim is off."; 12/04/2020: pt reports slight improvement since eval and has started using her R hand to eat, but still feels clumsy; 01/05/2021: Greatly improved; pt is using silverware to eat with R hand, can comb hair with RUE, still mildly ataxic; 01/26/21: pt reports difficulty picking up objects within a small space (ie;  may knock the toothpaste holder down when reaching for toothbrush), but reaching for light/larger objects is improving 20th visit: pt. continues to be mildly ataxic, however is consistently improving with motor control, and [place] while reaching targets; 03/23/21: Pt reaches with accuracy towards targets if she takes her time (pt drops and knocks things over when moving at a faster/normal pace) 04/23/2021: Pt. continues to present with difficulty with depth perception, and impaired [place].40th visit: Pt. is improving, however tends to knock over lighter weight objects.50th visit: Pt. has improved with reaching for items without knocking them over, however continues to need work on accuracy.07/14/2021: Pt. is improving while motor control, and accuracy of reaching for objects. Pt. continues to work towards improving efficiency with reaching for objects. 07/30/2021: Pt. is able to able to reach for objects more accurately, however continues to present with limited motor control in the Right.   ? Time 12   ? Period Weeks   ? Status On-going   ? Target Date 10/06/21   ?  ? OT LONG TERM GOAL #4  ? Title Pt will improve FOTO score to 68 or better to indicate measurable functional improvement.   ? Baseline Eval: FOTO 55; 01/05/2021: FOTO 61; 01/28/21: FOTO 61, 20th visit: 61; 03/23/21: FOTO 60 4oth visit: FOTO score: 61, 50th visit: FOTO 69 07/30/2021: FOTO 71   ? Time 12   ? Period Weeks   ? Status On-going   ? Target Date 10/06/21   ?   ? OT LONG TERM GOAL #6  ? Title Pt. will improve right hand [facility] skills to be able to indepedendently manipulate and set-up her medication/pills in the pillbox.   ? Baseline 07/14/2021: Pt. has difficulty

## 2021-08-04 NOTE — Therapy (Signed)
[facility] ?[place], [place], [address] ?Phone: [phone]   Fax:  [phone] ? ?Physical Therapy Treatment ? ?Patient Details  ?Name: Cassandra Schaefer ?MRN: [id number] ?Date of Birth: October 21, 1928 ?Referring Provider (PT): Dr. Dew/Dr. Ginette Pitman PCP ? ? ?Encounter Date: 08/04/2021 ? ? PT End of Session - 08/04/21 1357   ? ? Visit Number 27   ? Number of Visits 70   ? Date for PT Re-Evaluation 08/20/21   ? Authorization Type [employer] Medicare   ? PT Start Time 1348   ? PT Stop Time 1430   ? PT Time Calculation (min) 42 min   ? Equipment Utilized During Treatment Gait belt   ? Activity Tolerance Patient tolerated treatment well   ? Behavior During Therapy [facility] for tasks assessed/performed   ? ?  ?  ? ?  ? ? ?Past Medical History:  ?Diagnosis Date  ? Anemia   ? Cough   ? RESOLVING, NO FEVER  ? Diabetes mellitus without complication ([place])   ? Gout   ? Hypertension   ? Hypothyroidism   ? PUD (peptic ulcer disease)   ? Stroke [facility])   ? Wears dentures   ? full upper, partial lower  ? ? ?Past Surgical History:  ?Procedure Laterality Date  ? AMPUTATION TOE Left 12/14/2017  ? Procedure: AMPUTATION TOE-4TH MPJ;  Surgeon: Samara Deist, DPM;  Location: [place];  Service: Podiatry;  Laterality: Left;  IVA LOCAL ?Diabetic - oral meds  ? APPENDECTOMY    ? CATARACT EXTRACTION W/PHACO Right 06/23/2015  ? Procedure: CATARACT EXTRACTION PHACO AND INTRAOCULAR LENS PLACEMENT (IOC);  Surgeon: Estill Cotta, MD;  Location: [facility];  Service: Ophthalmology;  Laterality: Right;  [place] 01:28 ?AP% 23.4 ?CDE 36.14 ?fluid pack lot # [id number]  ? CATARACT EXTRACTION W/PHACO Left 07/28/2015  ? Procedure: CATARACT EXTRACTION PHACO AND INTRAOCULAR LENS PLACEMENT (IOC);  Surgeon: Estill Cotta, MD;  Location: [facility];  Service: Ophthalmology;  Laterality: Left;  [place]    1:29.7 ?AP%  24.9 ?CDE   41.32 ?fluid casette lot #[id number] H exp 09/23/2016  ? COONOSCOPY    ? AND ENDOSCOPY   ? DILATION AND CURETTAGE OF UTERUS    ? ENDARTERECTOMY Left 11/13/2020  ? Procedure: Evacuation of left neck hematoma;  Surgeon: Katha Cabal, MD;  Location: [facility];  Service: Vascular;  Laterality: Left;  ? ENDARTERECTOMY Left 11/13/2020  ? Procedure: ENDARTERECTOMY CAROTID;  Surgeon: Algernon Huxley, MD;  Location: [facility];  Service: Vascular;  Laterality: Left;  ? TONSILLECTOMY    ? TUBAL LIGATION    ? ? ?There were no vitals filed for this visit. ? ? Subjective Assessment - 08/04/21 1356   ? ? Subjective Patient reports doing well. No new falls or stumbles. She reports walking a lot over the weekend;   ? Patient is accompained by: --   Daughter, [name]  ? Pertinent History 88 y.o. female with medical history significant for type 2 diabetes mellitus, essential hypertension, acquired hypothyroidism, hyperlipidemia, stage III chronic kidney disease reports increased right sided weakness on 10/19/20. She was diagnosed with left basal ganglia ischemic stroke. She was discharged to inpatient rehab for 10 days and then discharged home. Patient was evaluated by outpatient PT on 11/12/20. CVA was due to carotid stenosis. She underwent left carotid endartectomy on 7/21. Procedure went well however patient suffered from left cervical hematoma and had subsequent surgical procedure to stop the bleed and remove the hematoma on  11/13/20. They were concerned with her breathing and therefore intubated her for 1 day, she was in ICU for 2 days and transferred to med/surge on 11/15/20. Patient was discharged home on 11/17/20 and is now returning to outpatient PT. She has had the stiches removed and is healing well. Denies any neck pain or stiffness. She lives with her daughter and has 24/7 caregiver support. She is still using RW for most ambulation. She is able to transfer to bedside commode well and is able to stand short time unsupported. She reports feeling very weak and fatigued. She reports she has been dragging her right foot  more. She denies any numbness/tingling; She reports feeling like her legs are wanting to do more but is hesitant because of fear of falling. She is mod I for self care morning routine. She does still receive help with showering. She has been working on increasing her activity but denies any formal exercise.   ? Limitations Standing;Walking   ? How long can you sit comfortably? NA   ? How long can you stand comfortably? 30-60 min with support   ? How long can you walk comfortably? about 100 feet with RW, still limited to short distances;   ? Diagnostic tests MRI shows left basal ganglia infarct 10/21/20   ? Patient Stated Goals "Improve balance and walking and not need RW, get back to independent."   ? Currently in Pain? No/denies   ? Pain Onset In the past 7 days   ? ?  ?  ? ?  ? ? ? ? ? ? ?  ?TREATMENT: ?Gait around gym x80 feet with CGA, no assistive device, reciprocal gait pattern, does exhibit intermittent RLE foot drag with occasional mis-step requiring min A to avoid loss of balance. Pt also ambulates with slower gait speed;  ?  ?Ex: ?Standing at steps: ?Hip flexion march against green tband x15 reps each LE with BUE rail assist; Reports increased difficulty lifting RLE compared to LLE;  ? ?Standing on 1/2 bolster (round side up): ?-BLE heel raises x15 reps ?-BLE toe raises x15 reps ?Required BUE rail assist for safety ?Required cues to increase ROM for better ankle ROM for improved ankle strategies;  ? ?Sit<>Stand from chair unsupported x10 reps; ? ?NMR: ?4 square stepping: ?Clockwise x5 reps unsupported, CGA for safety with cues for increased step length, especially posterior stepping ?Multi-directional stepping (variable) x1-2 min with min A for safety and increased difficulty stepping backward; ? ?Stepping forward/backward over 1/2 bolster with 1 rail assist x10 reps ?Progressed to forward walking stepping over 1/2 bolster without rail assist, x8 reps with min A to CGA for safety, does exhibit increased  step length with RLE, having to step and step over rather than smoothly stepping over 1/2 bolster; ? ? Standing on airex pad at steps: ?Toe tap with LLE to 2nd step (8+ inch step) x10 reps unsupported, CGA for safety ?Toe tap with RLE to 1st step on cone to challenge motor control with  visual target x10 reps with 1 rail assist; patient has difficulty controlling RLE knocking over cone twice; ? ?Gait from PT to OT gym x125 feet, unsupported, CGA to min A for safety and cues for increased RLE heel strike; She exhibits better positioning/balance but does occasionally drag RLE foot especially with fatigue;  ?  ?Pt educated throughout session about proper posture and technique with exercises. Improved exercise technique, movement at target joints, use of target muscles after min to mod verbal, visual, tactile cues. ?  ?  Patient tolerated session fair. She does report increased fatigue at end of session. Reinforced HEP ?  ?  ?  ? ? ? ? ? ? ? ? ? ? ? ? ? ? ? ? ? ? ? ? ? ? PT Education - 08/04/21 1356   ? ? Education Details exercise technique/positioning; balance;   ? Person(s) Educated Patient   ? Methods Explanation;Verbal cues   ? Comprehension Verbalized understanding;Returned demonstration;Verbal cues required;Need further instruction   ? ?  ?  ? ?  ? ? ? PT Short Term Goals - 06/25/21 1151   ? ?  ? PT SHORT TERM GOAL #1  ? Title Patient will be adherent to HEP at least 3x a week to improve functional strength and balance for better safety at home.   ? Baseline 9/21: Pt reports compliance with HEP and is confident, 1/3: doing exercise 2x a week; 06/25/21: 3x/week   ? Time 4   ? Period Weeks   ? Status Achieved   ? Target Date 06/25/21   ?  ? PT SHORT TERM GOAL #2  ? Title Patient will be independent in transferring sit<>Stand without pushing on arm rests to improve ability to get up from chair.   ? Baseline 9/21: pt able to complete STS without use of UEs, 1/3:able to stand but has moderate difficulty requiring  increased time; 06/25/21: able to perform indep without hands but requiring use of momentum to attain standing. Remains stable.   ? Time 4   ? Period Weeks   ? Status Partially Met   ? Target Date 07/23/21   ? ?

## 2021-08-06 ENCOUNTER — Ambulatory Visit: Payer: Medicare HMO | Admitting: Physical Therapy

## 2021-08-06 ENCOUNTER — Ambulatory Visit: Payer: Medicare HMO | Admitting: Occupational Therapy

## 2021-08-06 ENCOUNTER — Encounter: Payer: Self-pay | Admitting: Occupational Therapy

## 2021-08-06 ENCOUNTER — Encounter: Payer: Self-pay | Admitting: Physical Therapy

## 2021-08-06 DIAGNOSIS — R262 Difficulty in walking, not elsewhere classified: Secondary | ICD-10-CM

## 2021-08-06 DIAGNOSIS — R2681 Unsteadiness on feet: Secondary | ICD-10-CM

## 2021-08-06 DIAGNOSIS — M6281 Muscle weakness (generalized): Secondary | ICD-10-CM | POA: Diagnosis not present

## 2021-08-06 DIAGNOSIS — R482 Apraxia: Secondary | ICD-10-CM

## 2021-08-06 DIAGNOSIS — R278 Other lack of coordination: Secondary | ICD-10-CM

## 2021-08-06 DIAGNOSIS — R2689 Other abnormalities of gait and mobility: Secondary | ICD-10-CM

## 2021-08-06 DIAGNOSIS — R269 Unspecified abnormalities of gait and mobility: Secondary | ICD-10-CM

## 2021-08-06 NOTE — Therapy (Signed)
[place] [facility] ?[place], [place], [address] ?Phone: [phone]   Fax:  [phone] ? ?Physical Therapy Treatment ?Physical Therapy Progress Note ? ? ?Dates of reporting period  04/28/21   to   08/06/21 ? ? ? ?Patient Details  ?Name: Cassandra Schaefer ?MRN: [id number] ?Date of Birth: [birth year] ?Referring Provider (PT): Dr. Dew/Dr. Ginette Pitman PCP ? ? ?Encounter Date: 08/06/2021 ? ? PT End of Session - 08/06/21 2153   ? ? Visit Number 60   ? Number of Visits 70   ? Date for PT Re-Evaluation 08/20/21   ? Authorization Type [employer]na Medicare   ? PT Start Time 1148   ? PT Stop Time 1230   ? PT Time Calculation (min) 42 min   ? Equipment Utilized During Treatment Gait belt   ? Activity Tolerance Patient tolerated treatment well   ? Behavior During Therapy [facility] for tasks assessed/performed   ? ?  ?  ? ?  ? ? ?Past Medical History:  ?Diagnosis Date  ? Anemia   ? Cough   ? RESOLVING, NO FEVER  ? Diabetes mellitus without complication ([place])   ? Gout   ? Hypertension   ? Hypothyroidism   ? PUD (peptic ulcer disease)   ? Stroke [facility])   ? Wears dentures   ? full upper, partial lower  ? ? ?Past Surgical History:  ?Procedure Laterality Date  ? AMPUTATION TOE Left 12/14/2017  ? Procedure: AMPUTATION TOE-4TH MPJ;  Surgeon: Samara Deist, DPM;  Location: [facility];  Service: Podiatry;  Laterality: Left;  IVA LOCAL ?Diabetic - oral meds  ? APPENDECTOMY    ? CATARACT EXTRACTION W/PHACO Right 06/23/2015  ? Procedure: CATARACT EXTRACTION PHACO AND INTRAOCULAR LENS PLACEMENT (IOC);  Surgeon: Estill Cotta, MD;  Location: [facility];  Service: Ophthalmology;  Laterality: Right;  [place] 01:28 ?AP% 23.4 ?CDE 36.14 ?fluid pack lot # [id number]  ? CATARACT EXTRACTION W/PHACO Left 07/28/2015  ? Procedure: CATARACT EXTRACTION PHACO AND INTRAOCULAR LENS PLACEMENT (IOC);  Surgeon: Estill Cotta, MD;  Location: [facility];  Service: Ophthalmology;  Laterality: Left;  [place]    1:29.7 ?AP%   24.9 ?CDE   41.32 ?fluid casette lot [id number] H exp 09/23/2016  ? COONOSCOPY    ? AND ENDOSCOPY  ? DILATION AND CURETTAGE OF UTERUS    ? ENDARTERECTOMY Left 11/13/2020  ? Procedure: Evacuation of left neck hematoma;  Surgeon: Katha Cabal, MD;  Location: [facility];  Service: Vascular;  Laterality: Left;  ? ENDARTERECTOMY Left 11/13/2020  ? Procedure: ENDARTERECTOMY CAROTID;  Surgeon: Algernon Huxley, MD;  Location: [facility];  Service: Vascular;  Laterality: Left;  ? TONSILLECTOMY    ? TUBAL LIGATION    ? ? ?There were no vitals filed for this visit. ? ? Subjective Assessment - 08/06/21 2151   ? ? Subjective Patient reports doing well. No new falls/stumbles. Reports adherence with HEP   ? Patient is accompained by: --   Daughter, [name]  ? Pertinent History 88 y.o. female with medical history significant for type 2 diabetes mellitus, essential hypertension, acquired hypothyroidism, hyperlipidemia, stage III chronic kidney disease reports increased right sided weakness on 10/19/20. She was diagnosed with left basal ganglia ischemic stroke. She was discharged to inpatient rehab for 10 days and then discharged home. Patient was evaluated by outpatient PT on 11/12/20. CVA was due to carotid stenosis. She underwent left carotid endartectomy on 7/21. Procedure went well however patient suffered from left cervical hematoma  and had subsequent surgical procedure to stop the bleed and remove the hematoma on 11/13/20. They were concerned with her breathing and therefore intubated her for 1 day, she was in ICU for 2 days and transferred to med/surge on 11/15/20. Patient was discharged home on 11/17/20 and is now returning to outpatient PT. She has had the stiches removed and is healing well. Denies any neck pain or stiffness. She lives with her daughter and has 24/7 caregiver support. She is still using RW for most ambulation. She is able to transfer to bedside commode well and is able to stand short time unsupported. She reports  feeling very weak and fatigued. She reports she has been dragging her right foot more. She denies any numbness/tingling; She reports feeling like her legs are wanting to do more but is hesitant because of fear of falling. She is mod I for self care morning routine. She does still receive help with showering. She has been working on increasing her activity but denies any formal exercise.   ? Limitations Standing;Walking   ? How long can you sit comfortably? NA   ? How long can you stand comfortably? 30-60 min with support   ? How long can you walk comfortably? about 100 feet with RW, still limited to short distances;   ? Diagnostic tests MRI shows left basal ganglia infarct 10/21/20   ? Patient Stated Goals "Improve balance and walking and not need RW, get back to independent."   ? Currently in Pain? No/denies   ? Pain Onset In the past 7 days   ? ?  ?  ? ?  ? ? ? ? ? [facility] PT Assessment - 08/06/21 0001   ? ?  ? Observation/Other Assessments  ? Focus on Therapeutic Outcomes (FOTO)  56% (more impared than last assessment on 04/28/21 which was 61%)   ?  ? 6 minute walk test results   ? Aerobic Endurance Distance Walked 450   ? Endurance additional comments without AD, CGA with occasional mis step requiring min A for balance, improved from 475 feet on 1/3 with RW;   ?  ? Standardized Balance Assessment  ? Five times sit to stand comments  15.5 sec without UE assist, >15 indicates increased risk for falls, improved from 33.9 sec on 04/28/21   ? 10 Meter Walk 28.5 sec (0.35 m/s) without AD, limited home ambulator   ?  ? Berg Balance Test  ? Sit to Stand Able to stand without using hands and stabilize independently   ? Standing Unsupported Able to stand safely 2 minutes   ? Sitting with Back Unsupported but Feet Supported on Floor or Stool Able to sit safely and securely 2 minutes   ? Stand to Sit Sits safely with minimal use of hands   ? Transfers Able to transfer safely, minor use of hands   ? Standing Unsupported with Eyes  Closed Able to stand 10 seconds safely   ? Standing Unsupported with Feet Together Needs help to attain position but able to stand for 30 seconds with feet together   ? From Standing, Reach Forward with Outstretched Arm Can reach confidently >25 cm (10")   ? From Standing Position, Pick up Object from [place] to pick up shoe safely and easily   ? From Standing Position, Turn to Look Behind Over each Shoulder Looks behind from both sides and weight shifts well   ? Turn 360 Degrees Needs assistance while turning   ? Standing Unsupported,  Alternately Place Feet on Step/Stool Able to complete >2 steps/needs minimal assist   ? Standing Unsupported, One Foot in Front Able to take small step independently and hold 30 seconds   ? Standing on One Leg Tries to lift leg/unable to hold 3 seconds but remains standing independently   ? Total Score 41   ? ?  ?  ? ?  ? ? ? ? ?TREATMENT: ?Instructed patient in 6 min walk, 10 meter walk, Berg Balance Assessment, 5 times sit<>Stand etc to address goals; ? ?Patient's condition has the potential to improve in response to therapy. Maximum improvement is yet to be obtained. The anticipated improvement is attainable and reasonable in a generally predictable time.  Patient reports adherence with HEP. She is hoping to return to walking without AD ? ? ? ? ? ? ? ? ? ? ? ? ? ? ? ? ? ? ? ? PT Education - 08/06/21 2151   ? ? Education Details progress towards goals;   ? Person(s) Educated Patient   ? Methods Explanation;Verbal cues   ? Comprehension Verbalized understanding;Returned demonstration;Verbal cues required;Need further instruction   ? ?  ?  ? ?  ? ? ? PT Short Term Goals - 06/25/21 1151   ? ?  ? PT SHORT TERM GOAL #1  ? Title Patient will be adherent to HEP at least 3x a week to improve functional strength and balance for better safety at home.   ? Baseline 9/21: Pt reports compliance with HEP and is confident, 1/3: doing exercise 2x a week; 06/25/21: 3x/week   ? Time 4   ? Period  Weeks   ? Status Achieved   ? Target Date 06/25/21   ?  ? PT SHORT TERM GOAL #2  ? Title Patient will be independent in transferring sit<>Stand without pushing on arm rests to improve ability to get up from chair.

## 2021-08-06 NOTE — Therapy (Signed)
[place] [facility] ?[address] ?[place], [place], [address] ?Phone: [phone]   Fax:  [phone] ? ?Occupational Therapy Treatment ? ?Patient Details  ?Name: Cassandra Schaefer ?MRN: [id number] ?Date of Birth: [birth year] ?Referring Provider (OT): Dr. Barbette Reichmann ? ? ?Encounter Date: 08/06/2021 ? ? OT End of Session - 08/06/21 1114   ? ? Visit Number [id number]   ? Number of Visits 90   ? Date for OT Re-Evaluation 10/06/21   ? Authorization Time Period Reporting period starting 06/18/2020   ? OT Start Time 1105   ? OT Stop Time 1145   ? OT Time Calculation (min) 40 min   ? Activity Tolerance Patient tolerated treatment well   ? Behavior During Therapy [facility] for tasks assessed/performed   ? ?  ?  ? ?  ? ? ?Past Medical History:  ?Diagnosis Date  ? Anemia   ? Cough   ? RESOLVING, NO FEVER  ? Diabetes mellitus without complication (HCC)   ? Gout   ? Hypertension   ? Hypothyroidism   ? PUD (peptic ulcer disease)   ? Stroke [facility])   ? Wears dentures   ? full upper, partial lower  ? ? ?Past Surgical History:  ?Procedure Laterality Date  ? AMPUTATION TOE Left 12/14/2017  ? Procedure: AMPUTATION TOE-4TH MPJ;  Surgeon: Gwyneth Revels, DPM;  Location: [facility];  Service: Podiatry;  Laterality: Left;  IVA LOCAL ?Diabetic - oral meds  ? APPENDECTOMY    ? CATARACT EXTRACTION W/PHACO Right 06/23/2015  ? Procedure: CATARACT EXTRACTION PHACO AND INTRAOCULAR LENS PLACEMENT (IOC);  Surgeon: Sallee Lange, MD;  Location: [facility];  Service: Ophthalmology;  Laterality: Right;  [place] 01:28 ?AP% 23.4 ?CDE 36.14 ?fluid pack lot # [id number]  ? CATARACT EXTRACTION W/PHACO Left 07/28/2015  ? Procedure: CATARACT EXTRACTION PHACO AND INTRAOCULAR LENS PLACEMENT (IOC);  Surgeon: Sallee Lange, MD;  Location: [facility];  Service: Ophthalmology;  Laterality: Left;  [place]    1:29.7 ?AP%  24.9 ?CDE   41.32 ?fluid casette lot #[id number] H exp 09/23/2016  ? COONOSCOPY    ? AND ENDOSCOPY  ? DILATION AND  CURETTAGE OF UTERUS    ? ENDARTERECTOMY Left 11/13/2020  ? Procedure: Evacuation of left neck hematoma;  Surgeon: Renford Dills, MD;  Location: [facility];  Service: Vascular;  Laterality: Left;  ? ENDARTERECTOMY Left 11/13/2020  ? Procedure: ENDARTERECTOMY CAROTID;  Surgeon: Annice Needy, MD;  Location: [facility];  Service: Vascular;  Laterality: Left;  ? TONSILLECTOMY    ? TUBAL LIGATION    ? ? ?There were no vitals filed for this visit. ? ? Subjective Assessment - 08/06/21 1114   ? ? Subjective  Pt. reports being cold today.   ? Patient is accompanied by: Family member   ? Pertinent History R ankle pain, gout, T2DM, HTN, CKDIII; R/L carotid endarectomy   ? Currently in Pain? No/denies   ? ?  ?  ? ?  ? ?Neuromuscular re-education:  ?  ?Pt. worked on right hand [facility] skills grasping small beads to simulate medication/pills from the pillbox, and sorting them into the bottles. Pt. had more difficulty removing the beads ones at a time from the pillbox when multiple pills are in one section. Pt. Worked on grasping them, storing them in her right palm,  and  performing translatory movements prior to sorting them into the bottles.  ?  ?Pt. education was provided about using a towel on the tabletop to prevent  the simulated pills/beads from bouncing off the table to the floor. Pt. Reports that she uses one at home. Pt. initially attempted to remove the pills using her right thumb to slide the pills up the wall of the pillbox, and required cues to use her 2nd digit to slide the pills up the pillbox wall to meet her thumb. Pt. has more difficulty grasping multiple pills from the pillbox, and dropped several pegs. Pt. Continues to work on improving right hand function, and [facility] skills in order to work towards improving, and maximizing independence with ADLs,  and IADL tasks.  ?  ? ? ? ? ? ? ? ? ? ? ? ? ? ? ? ? ? ? ? ? ? OT Education - 08/06/21 1114   ? ? Education Details RUE functioning.   ? Person(s) Educated Patient   ?  Methods Explanation;Verbal cues;Demonstration;Tactile cues   ? Comprehension Verbalized understanding;Verbal cues required;Returned demonstration   ? ?  ?  ? ?  ? ? ? OT Short Term Goals - 03/23/21 1657   ? ?  ? OT SHORT TERM GOAL #1  ? Title Pt will perform HEP for RUE strength/coordination independently.   ? Baseline Eval: HEP not yet established in outpatient, but pt is working with putty from [name]; 12/04/2020; [facility] initiated this day with mod vc after return demo; 01/05/2021: indep with currently program, but ongoing as pt progresses; 01/28/2021: vc to revisit and focus on grip and pinch strengthening with putty; 03/23/21: pt is indep with HEP   ? Time 6   ? Period Weeks   ? Status Achieved   ? Target Date 03/23/21   ? ?  ?  ? ?  ? ? ? ? OT Long Term Goals - 07/30/21 1320   ? ?  ? OT LONG TERM GOAL #1  ? Title Pt will improve hand writing to 100% legibility with R hand to be able to independently write a check.   ? Baseline Eval: signature is 75% legible with R hand, requiring extra time; 12/04/2020: no chan to complete.ge from eval; 01/05/2021: Printing is 100% legible, signature is 90% legible, but still requires extra time and effort.; 01/26/21: Signature is 90% legible and speed/fluidity have improved. 20th visit: 90% legible, 03/23/21: 90% legible, extra time 40th visit 90 % legibility with increased time, 50th visit: 90% legible with with increased time required 07/14/2021: 90% legibility. 07/30/2021: 90% legibility, pt. presses too hard through the pen, and pencil.   ? Time 12   ? Period Weeks   ? Status On-going   ? Target Date 10/06/21   ?  ? OT LONG TERM GOAL #3  ? Title Pt will improve GMC throughout RUE to enable pt to reach for and pick up ADL supplies with R dominant hand without dropping or knocking over objects.   ? Baseline Eval: Pt reports RUE is clumsy and easily knocks over objects when trying to reach for ADL supplies.  Pt verbalizes that her "aim is off."; 12/04/2020: pt reports slight improvement  since eval and has started using her R hand to eat, but still feels clumsy; 01/05/2021: Greatly improved; pt is using silverware to eat with R hand, can comb hair with RUE, still mildly ataxic; 01/26/21: pt reports difficulty picking up objects within a small space (ie; may knock the toothpaste holder down when reaching for toothbrush), but reaching for light/larger objects is improving 20th visit: pt. continues to be mildly ataxic, however is consistently improving with  motor control, and FMC while reaching targets; 03/23/21: Pt reaches with accuracy towards targets if she takes her time (pt drops and knocks things over when moving at a faster/normal pace) 04/23/2021: Pt. continues to present with difficulty with depth perception, and impaired FMC.40th visit: Pt. is improving, however tends to knock over lighter weight objects.50th visit: Pt. has improved with reaching for items without knocking them over, however continues to need work on accuracy.07/14/2021: Pt. is improving while motor control, and accuracy of reaching for objects. Pt. continues to work towards improving efficiency with reaching for objects. 07/30/2021: Pt. is able to able to reach for objects more accurately, however continues to present with limited motor control in the Right.   ? Time 12   ? Period Weeks   ? Status On-going   ? Target Date 10/06/21   ?  ? OT LONG TERM GOAL #4  ? Title Pt will improve FOTO score to 68 or better to indicate measurable functional improvement.   ? Baseline Eval: FOTO 55; 01/05/2021: FOTO 61; 01/28/21: FOTO 61, 20th visit: 61; 03/23/21: FOTO 60 4oth visit: FOTO score: 61, 50th visit: FOTO 69 07/30/2021: FOTO 71   ? Time 12   ? Period Weeks   ? Status On-going   ? Target Date 10/06/21   ?  ? OT LONG TERM GOAL #6  ? Title Pt. will improve right hand [facility] skills to be able to indepedendently manipulate and set-up her medication/pills in the pillbox.   ? Baseline 07/14/2021: Pt. has difficulty manipulating small pills, and  often drops them. 07/30/2021: Pt. continues to have difficulty grasping pills/medication for set-up of pillbox.   ? Time 12   ? Period Weeks   ? Status New   ? Target Date 10/06/21   ? ?  ?  ? ?  ? ? ? ? ? ? ? ?

## 2021-08-11 ENCOUNTER — Ambulatory Visit: Payer: Medicare HMO | Admitting: Occupational Therapy

## 2021-08-11 ENCOUNTER — Ambulatory Visit: Payer: Medicare HMO | Admitting: Physical Therapy

## 2021-08-11 ENCOUNTER — Encounter: Payer: Self-pay | Admitting: Physical Therapy

## 2021-08-11 ENCOUNTER — Encounter: Payer: Self-pay | Admitting: Occupational Therapy

## 2021-08-11 DIAGNOSIS — R278 Other lack of coordination: Secondary | ICD-10-CM

## 2021-08-11 DIAGNOSIS — R482 Apraxia: Secondary | ICD-10-CM

## 2021-08-11 DIAGNOSIS — R2689 Other abnormalities of gait and mobility: Secondary | ICD-10-CM

## 2021-08-11 DIAGNOSIS — R262 Difficulty in walking, not elsewhere classified: Secondary | ICD-10-CM

## 2021-08-11 DIAGNOSIS — R2681 Unsteadiness on feet: Secondary | ICD-10-CM

## 2021-08-11 DIAGNOSIS — M6281 Muscle weakness (generalized): Secondary | ICD-10-CM | POA: Diagnosis not present

## 2021-08-11 NOTE — Therapy (Signed)
[place] [facility] ?[place], [place], [address] ?Phone: [phone]   Fax:  [phone] ? ?Physical Therapy Treatment ? ?Patient Details  ?Name: Cassandra Schaefer ?MRN: [id number] ?Date of Birth: 12-01-1928 ?Referring Provider (PT): Dr. Dew/Dr. Ginette Pitman PCP ? ? ?Encounter Date: 08/11/2021 ? ? PT End of Session - 08/11/21 1156   ? ? Visit Number 56   ? Number of Visits 70   ? Date for PT Re-Evaluation 08/20/21   ? Authorization Type [employer]   ? PT Start Time 1148   ? PT Stop Time 1230   ? PT Time Calculation (min) 42 min   ? Equipment Utilized During Treatment Gait belt   ? Activity Tolerance Patient tolerated treatment well   ? Behavior During Therapy [facility] for tasks assessed/performed   ? ?  ?  ? ?  ? ? ?Past Medical History:  ?Diagnosis Date  ? Anemia   ? Cough   ? RESOLVING, NO FEVER  ? Diabetes mellitus without complication ([place])   ? Gout   ? Hypertension   ? Hypothyroidism   ? PUD (peptic ulcer disease)   ? Stroke [facility])   ? Wears dentures   ? full upper, partial lower  ? ? ?Past Surgical History:  ?Procedure Laterality Date  ? AMPUTATION TOE Left 12/14/2017  ? Procedure: AMPUTATION TOE-4TH MPJ;  Surgeon: Samara Deist, DPM;  Location: [place];  Service: Podiatry;  Laterality: Left;  IVA LOCAL ?Diabetic - oral meds  ? APPENDECTOMY    ? CATARACT EXTRACTION W/PHACO Right 06/23/2015  ? Procedure: CATARACT EXTRACTION PHACO AND INTRAOCULAR LENS PLACEMENT (IOC);  Surgeon: Estill Cotta, MD;  Location: [facility];  Service: Ophthalmology;  Laterality: Right;  [place] 01:28 ?AP% 23.4 ?CDE 36.14 ?fluid pack lot # [id number]  ? CATARACT EXTRACTION W/PHACO Left 07/28/2015  ? Procedure: CATARACT EXTRACTION PHACO AND INTRAOCULAR LENS PLACEMENT (IOC);  Surgeon: Estill Cotta, MD;  Location: [facility];  Service: Ophthalmology;  Laterality: Left;  [place]    1:29.7 ?AP%  24.9 ?CDE   41.32 ?fluid casette lot #[id number] H exp 09/23/2016  ? COONOSCOPY    ? AND ENDOSCOPY   ? DILATION AND CURETTAGE OF UTERUS    ? ENDARTERECTOMY Left 11/13/2020  ? Procedure: Evacuation of left neck hematoma;  Surgeon: Katha Cabal, MD;  Location: [facility];  Service: Vascular;  Laterality: Left;  ? ENDARTERECTOMY Left 11/13/2020  ? Procedure: ENDARTERECTOMY CAROTID;  Surgeon: Algernon Huxley, MD;  Location: [facility];  Service: Vascular;  Laterality: Left;  ? TONSILLECTOMY    ? TUBAL LIGATION    ? ? ?There were no vitals filed for this visit. ? ? Subjective Assessment - 08/11/21 1155   ? ? Subjective Patient reports doing well. She had family come into town and celebrated her granddaughers birthday this weekend. no pain. no new falls;   ? Patient is accompained by: --   Daughter, Cassandra Schaefer  ? Pertinent History 88 y.o. female with medical history significant for type 2 diabetes mellitus, essential hypertension, acquired hypothyroidism, hyperlipidemia, stage III chronic kidney disease reports increased right sided weakness on 10/19/20. She was diagnosed with left basal ganglia ischemic stroke. She was discharged to inpatient rehab for 10 days and then discharged home. Patient was evaluated by outpatient PT on 11/12/20. CVA was due to carotid stenosis. She underwent left carotid endartectomy on 7/21. Procedure went well however patient suffered from left cervical hematoma and had subsequent surgical procedure to stop the bleed  and remove the hematoma on 11/13/20. They were concerned with her breathing and therefore intubated her for 1 day, she was in ICU for 2 days and transferred to med/surge on 11/15/20. Patient was discharged home on 11/17/20 and is now returning to outpatient PT. She has had the stiches removed and is healing well. Denies any neck pain or stiffness. She lives with her daughter and has 24/7 caregiver support. She is still using RW for most ambulation. She is able to transfer to bedside commode well and is able to stand short time unsupported. She reports feeling very weak and fatigued. She  reports she has been dragging her right foot more. She denies any numbness/tingling; She reports feeling like her legs are wanting to do more but is hesitant because of fear of falling. She is mod I for self care morning routine. She does still receive help with showering. She has been working on increasing her activity but denies any formal exercise.   ? Limitations Standing;Walking   ? How long can you sit comfortably? NA   ? How long can you stand comfortably? 30-60 min with support   ? How long can you walk comfortably? about 100 feet with RW, still limited to short distances;   ? Diagnostic tests MRI shows left basal ganglia infarct 10/21/20   ? Patient Stated Goals "Improve balance and walking and not need RW, get back to independent."   ? Currently in Pain? No/denies   ? Pain Onset In the past 7 days   ? ?  ?  ? ?  ? ? ? ? ?  ?TREATMENT: ?Warm up on crosstrainer level 1 x3 min (Unbilled); ?Required min A to get on/off machine; ? ?Gait around gym x80 feet  x2 sets with CGA, no assistive device, reciprocal gait pattern, does exhibit intermittent RLE foot drag with occasional mis-step requiring min A to avoid loss of balance. Pt also ambulates with slower gait speed;  ?  ?Ex: ?Standing at steps: ?Standing on 1/2 bolster (round side up): ?-BLE heel raises x15 reps ?-BLE toe raises x15 reps ?Required BUE rail assist for safety ?Required cues to increase ROM for better ankle ROM for improved ankle strategies;  ?  ?Side stepping on tip toes to challenge calf control/strengthening x5 feet x1 lap each direction with rail assist;  ?  ?NMR: ? Standing on airex pad at steps: ?Alternate toe tap with each LE to 2nd step (8+ inch step) x10 reps with 1 rail assist CGA for safety ?Standing one foot on airex, one foot on 2nd step, unsupported 15 sec hold x1 rep each foot on step;  ? ?Advanced HEP- see patient instructions ?Patient will continue with tandem walk but added backward stepping to challenge dynamic balance; ?  ?Pt  educated throughout session about proper posture and technique with exercises. Improved exercise technique, movement at target joints, use of target muscles after min to mod verbal, visual, tactile cues. ?  ?Patient tolerated session well. She does report mild fatigue at end of session.  ?  ?  ? ? ? ? ? ? ? ? ? ? ? ? ? ? ? ? ? ? ? ? ? ? ? ? PT Education - 08/11/21 1156   ? ? Education Details exercise technique/positioning;   ? Person(s) Educated Patient   ? Methods Explanation;Verbal cues   ? Comprehension Verbalized understanding;Returned demonstration;Verbal cues required;Need further instruction   ? ?  ?  ? ?  ? ? ? PT Short Term  Goals - 06/25/21 1151   ? ?  ? PT SHORT TERM GOAL #1  ? Title Patient will be adherent to HEP at least 3x a week to improve functional strength and balance for better safety at home.   ? Baseline 9/21: Pt reports compliance with HEP and is confident, 1/3: doing exercise 2x a week; 06/25/21: 3x/week   ? Time 4   ? Period Weeks   ? Status Achieved   ? Target Date 06/25/21   ?  ? PT SHORT TERM GOAL #2  ? Title Patient will be independent in transferring sit<>Stand without pushing on arm rests to improve ability to get up from chair.   ? Baseline 9/21: pt able to complete STS without use of UEs, 1/3:able to stand but has moderate difficulty requiring increased time; 06/25/21: able to perform indep without hands but requiring use of momentum to attain standing. Remains stable.   ? Time 4   ? Period Weeks   ? Status Partially Met   ? Target Date 07/23/21   ? ?  ?  ? ?  ? ? ? ? PT Long Term Goals - 08/06/21 1220   ? ?  ? PT LONG TERM GOAL #1  ? Title Patient (> 38 years old) will complete five times sit to stand test in < 15 seconds indicating an increased LE strength and improved balance.   ? Baseline 9/21: 29 seconds hands-free 10/10: deferred 10/12: 34 sec hands free; 11/2: 23.8 sec hands-free; 12/7: 33.2 sec hands free, 1/3: 33.92 hands free; 06/25/21: 25.69 sec, 4/13: 15.5 sec hands free   ?  Time 8   ? Period Weeks   ? Status Partially Met   ? Target Date 08/20/21   ?  ? PT LONG TERM GOAL #2  ? Title Patient will increase six minute walk test distance to >400 feet for improve gait ability   ? [employer]

## 2021-08-11 NOTE — Patient Instructions (Signed)
Access Code: [email or web address] ?URL: [email or web address] ?Date: 08/11/2021 ?Prepared by: Blanche East ? ?Exercises ?- Foot Taps on Steps of Various Heights and Placements (BKA)  - 1 x daily - 7 x weekly - 1 sets - 3-5 reps - 10-15 sec hold ?- Toe Walking  - 1 x daily - 7 x weekly - 1 sets - 2 reps ?- Tandem Walking with Counter Support  - 1 x daily - 7 x weekly - 1 sets - 3 reps ?- Backward Walking with Counter Support  - 1 x daily - 7 x weekly - 1 sets - 3 reps ?

## 2021-08-11 NOTE — Therapy (Signed)
[place] ?[facility] ?[address] ?[place], [place], [address] ?Phone: [phone]   Fax:  [phone] ? ?Occupational Therapy Treatment ? ?Patient Details  ?Name: Cassandra Schaefer ?MRN: [id number] ?Date of Birth: [birth year] ?Referring Provider (OT): Dr. Barbette Reichmann ? ? ?Encounter Date: 08/11/2021 ? ? OT End of Session - 08/11/21 1114   ? ? Visit Number 63   ? Number of Visits 90   ? Date for OT Re-Evaluation 10/06/21   ? Authorization Time Period Reporting period starting 06/18/2020   ? OT Start Time 1100   ? OT Stop Time 1145   ? OT Time Calculation (min) 45 min   ? Activity Tolerance Patient tolerated treatment well   ? Behavior During Therapy [facility] for tasks assessed/performed   ? ?  ?  ? ?  ? ? ?Past Medical History:  ?Diagnosis Date  ? Anemia   ? Cough   ? RESOLVING, NO FEVER  ? Diabetes mellitus without complication (HCC)   ? Gout   ? Hypertension   ? Hypothyroidism   ? PUD (peptic ulcer disease)   ? Stroke [facility])   ? Wears dentures   ? full upper, partial lower  ? ? ?Past Surgical History:  ?Procedure Laterality Date  ? AMPUTATION TOE Left 12/14/2017  ? 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 APPENDECTOMY    ? CATARACT EXTRACTION W/PHACO Right 06/23/2015  ? Procedure: CATARACT EXTRACTION PHACO AND INTRAOCULAR LENS PLACEMENT (IOC);  Surgeon: Sallee Lange, MD;  Location: [facility];  Service: Ophthalmology;  Laterality: Right;  [place] 01:28 ?AP% 23.4 ?CDE 36.14 ?fluid pack lot # [id number]  ? CATARACT EXTRACTION W/PHACO Left 07/28/2015  ? Procedure: CATARACT EXTRACTION PHACO AND INTRAOCULAR LENS PLACEMENT (IOC);  Surgeon: Sallee Lange, MD;  Location: [facility];  Service: Ophthalmology;  Laterality: Left;  [place]    1:29.7 ?AP%  24.9 ?CDE   41.32 ?fluid casette lot #[id number] H exp 09/23/2016  ? COONOSCOPY    ? AND ENDOSCOPY  ? DILATION AND  CURETTAGE OF UTERUS    ? ENDARTERECTOMY Left 11/13/2020  ? Procedure: Evacuation of left neck hematoma;  Surgeon: Renford Dills, MD;  Location: [facility];  Service: Vascular;  Laterality: Left;  ? ENDARTERECTOMY Left 11/13/2020  ? Procedure: ENDARTERECTOMY CAROTID;  Surgeon: Annice Needy, MD;  Location: [facility];  Service: Vascular;  Laterality: Left;  ? TONSILLECTOMY    ? TUBAL LIGATION    ? ? ?There were no vitals filed for this visit. ? ? Subjective Assessment - 08/11/21 1113   ? ? Subjective  Pt. reports being cold today.   ? Patient is accompanied by: Family member   ? Pertinent History R ankle pain, gout, T2DM, HTN, CKDIII; R/L carotid endarectomy   ? Currently in Pain? No/denies   ? ?  ?  ? ?  ? ?Neuromuscular re-education:  ?  ?Pt. worked on right hand [facility] skills grasping small beads to simulate medication/pills from the pillbox, and sorting them into the bottles. Pt. had more difficulty removing the beads ones at a time from the pillbox when multiple pills are in one section. Pt. worked on grasping them, storing them in her right palm,  and  performing translatory movements prior to sorting them into the bottles.  Pt. worked on isolating her 2nd digit, and retrieved the small simulated medication/beads from a  standard medication bottle. Pt. dropped multiple flat circular beads when trying to separate them from the sphere shaped ones. Pt. Worked on right hand [facility] skills grasping small earring backings, and placing them onto a thin dowel to simulate donning earrings. ?  ?Pt. Reports that she held her infant great granddaughter, and assisted in feeding her over the weekend. Pt. alternates between using her 2nd digit, and 3rd digits to grasp the beads when there are multiple beads in the pillbox.  Pt. Required the use of her 2nd digit, and thumb only to grasp the small beads when there were 2 left in pillbox, dropping only 2 pegs. Pt. Was able to use her 2nd, and 3rd digits for grasping when more than 2 pills  were in the pillbox square, however dropped multiple simulated pills. Pt. dropped multiple simulated pills when attempting to retrieve them from the medication bottle with an isolated 2nd digit. Pt. Required increased time, and cues to stabilize the earring backing when donning it over the dowel. Pt. Continues to work on improving right hand function, and [facility] skills in order to work towards improving, and maximizing independence with ADLs,  and IADL tasks ? ? ? ? ? ? ? ? ? ? ? ? ? ? ? ? ? ? ? ? ? OT Education - 08/11/21 1114   ? ? Education Details RUE functioning.   ? Person(s) Educated Patient   ? Methods Explanation;Verbal cues;Demonstration;Tactile cues   ? Comprehension Verbalized understanding;Verbal cues required;Returned demonstration   ? ?  ?  ? ?  ? ? ? OT Short Term Goals - 03/23/21 1657   ? ?  ? OT SHORT TERM GOAL #1  ? Title Pt will perform HEP for RUE strength/coordination independently.   ? Baseline Eval: HEP not yet established in outpatient, but pt is working with putty from [name]; 12/04/2020; [facility] HEP initiated this day with mod vc after return demo; 01/05/2021: indep with currently program, but ongoing as pt progresses; 01/28/2021: vc to revisit and focus on grip and pinch strengthening with putty; 03/23/21: pt is indep with HEP   ? Time 6   ? Period Weeks   ? Status Achieved   ? Target Date 03/23/21   ? ?  ?  ? ?  ? ? ? ? OT Long Term Goals - 07/30/21 1320   ? ?  ? OT LONG TERM GOAL #1  ? Title Pt will improve hand writing to 100% legibility with R hand to be able to independently write a check.   ? Baseline Eval: signature is 75% legible with R hand, requiring extra time; 12/04/2020: no chan to complete.ge from eval; 01/05/2021: Printing is 100% legible, signature is 90% legible, but still requires extra time and effort.; 01/26/21: Signature is 90% legible and speed/fluidity have improved. 20th visit: 90% legible, 03/23/21: 90% legible, extra time 40th visit 90 % legibility with increased time, 50th  visit: 90% legible with with increased time required 07/14/2021: 90% legibility. 07/30/2021: 90% legibility, pt. presses too hard through the pen, and pencil.   ? Time 12   ? Period Weeks   ? Status On-going   ? Target Date 10/06/21   ?  ? OT LONG TERM GOAL #3  ? Title Pt will improve [place] throughout RUE to enable pt to reach for and pick up ADL supplies with R dominant hand without dropping or knocking over objects.   ? Baseline Eval: Pt reports RUE is clumsy and easily knocks over objects when  trying to reach for ADL supplies.  Pt verbalizes that her "aim is off."; 12/04/2020: pt reports slight improvement since eval and has started using her R hand to eat, but still feels clumsy; 01/05/2021: Greatly improved; pt is using silverware to eat with R hand, can comb hair with RUE, still mildly ataxic; 01/26/21: pt reports difficulty picking up objects within a small space (ie; may knock the toothpaste holder down when reaching for toothbrush), but reaching for light/larger objects is improving 20th visit: pt. continues to be mildly ataxic, however is consistently improving with motor control, and [place] while reaching targets; 03/23/21: Pt reaches with accuracy towards targets if she takes her time (pt drops and knocks things over when moving at a faster/normal pace) 04/23/2021: Pt. continues to present with difficulty with depth perception, and impaired [place].40th visit: Pt. is improving, however tends to knock over lighter weight objects.50th visit: Pt. has improved with reaching for items without knocking them over, however continues to need work on accuracy.07/14/2021: Pt. is improving while motor control, and accuracy of reaching for objects. Pt. continues to work towards improving efficiency with reaching for objects. 07/30/2021: Pt. is able to able to reach for objects more accurately, however continues to present with limited motor control in the Right.   ? Time 12   ? Period Weeks   ? Status On-going   ? Target Date  10/06/21   ?  ? OT LONG TERM GOAL #4  ? Title Pt will improve FOTO score to 68 or better to indicate measurable functional improvement.   ? Baseline Eval: FOTO 55; 01/05/2021: FOTO 61; 01/28/21: FOTO 61, 20th visit:

## 2021-08-13 ENCOUNTER — Encounter: Payer: Self-pay | Admitting: Occupational Therapy

## 2021-08-13 ENCOUNTER — Ambulatory Visit: Payer: Medicare HMO | Admitting: Physical Therapy

## 2021-08-13 ENCOUNTER — Encounter: Payer: Self-pay | Admitting: Physical Therapy

## 2021-08-13 ENCOUNTER — Ambulatory Visit: Payer: Medicare HMO | Admitting: Occupational Therapy

## 2021-08-13 DIAGNOSIS — R278 Other lack of coordination: Secondary | ICD-10-CM

## 2021-08-13 DIAGNOSIS — R2681 Unsteadiness on feet: Secondary | ICD-10-CM

## 2021-08-13 DIAGNOSIS — R2689 Other abnormalities of gait and mobility: Secondary | ICD-10-CM

## 2021-08-13 DIAGNOSIS — M6281 Muscle weakness (generalized): Secondary | ICD-10-CM

## 2021-08-13 DIAGNOSIS — R262 Difficulty in walking, not elsewhere classified: Secondary | ICD-10-CM

## 2021-08-13 DIAGNOSIS — R482 Apraxia: Secondary | ICD-10-CM

## 2021-08-13 DIAGNOSIS — R269 Unspecified abnormalities of gait and mobility: Secondary | ICD-10-CM

## 2021-08-13 NOTE — Therapy (Signed)
[place] ?[place] ?[place], [place], [address] ?Phone: [phone]   Fax:  [phone] ? ?Occupational Therapy Treatment ? ?Patient Details  ?Name: Cassandra Schaefer ?MRN: [id number] ?Date of Birth: Jun 26, 1928 ?Referring Provider (OT): Dr. Tracie Harrier ? ? ?Encounter Date: 08/13/2021 ? ? OT End of Session - 08/13/21 1116   ? ? Visit Number 16   ? Number of Visits 90   ? Date for OT Re-Evaluation 10/06/21   ? Authorization Time Period Reporting period starting 06/18/2020   ? OT Start Time 1100   ? OT Stop Time 1145   ? OT Time Calculation (min) 45 min   ? Activity Tolerance Patient tolerated treatment well   ? Behavior During Therapy [facility] for tasks assessed/performed   ? ?  ?  ? ?  ? ? ?Past Medical History:  ?Diagnosis Date  ? Anemia   ? Cough   ? RESOLVING, NO FEVER  ? Diabetes mellitus without complication ([place])   ? Gout   ? Hypertension   ? Hypothyroidism   ? PUD (peptic ulcer disease)   ? Stroke [facility])   ? Wears dentures   ? full upper, partial lower  ? ? ?Past Surgical History:  ?Procedure Laterality Date  ? AMPUTATION TOE Left 12/14/2017  ? Procedure: AMPUTATION TOE-4TH MPJ;  Surgeon: Samara Deist, DPM;  Location: [place];  Service: Podiatry;  Laterality: Left;  IVA LOCAL ?Diabetic - oral meds  ? APPENDECTOMY    ? CATARACT EXTRACTION W/PHACO Right 06/23/2015  ? Procedure: CATARACT EXTRACTION PHACO AND INTRAOCULAR LENS PLACEMENT (IOC);  Surgeon: Estill Cotta, MD;  Location: [facility];  Service: Ophthalmology;  Laterality: Right;  [place] 01:28 ?AP% 23.4 ?CDE 36.14 ?fluid pack lot # [id number]  ? CATARACT EXTRACTION W/PHACO Left 07/28/2015  ? Procedure: CATARACT EXTRACTION PHACO AND INTRAOCULAR LENS PLACEMENT (IOC);  Surgeon: Estill Cotta, MD;  Location: [facility];  Service: Ophthalmology;  Laterality: Left;  [place]    1:29.7 ?AP%  24.9 ?CDE   41.32 ?fluid casette lot [id number] H exp 09/23/2016  ? COONOSCOPY    ? AND ENDOSCOPY  ? DILATION AND  CURETTAGE OF UTERUS    ? ENDARTERECTOMY Left 11/13/2020  ? Procedure: Evacuation of left neck hematoma;  Surgeon: Katha Cabal, MD;  Location: [facility];  Service: Vascular;  Laterality: Left;  ? ENDARTERECTOMY Left 11/13/2020  ? Procedure: ENDARTERECTOMY CAROTID;  Surgeon: Algernon Huxley, MD;  Location: [facility];  Service: Vascular;  Laterality: Left;  ? TONSILLECTOMY    ? TUBAL LIGATION    ? ? ?There were no vitals filed for this visit. ? ? Subjective Assessment - 08/13/21 1116   ? ? Subjective  Pt. reports being cold today.   ? Patient is accompanied by: Family member   ? Pertinent History R ankle pain, gout, T2DM, HTN, CKDIII; R/L carotid endarectomy   ? Currently in Pain? No/denies   ? ?  ?  ? ?  ? ?OT TREATMENT   ? ?Neuro muscular re-education: ? ?Pt. worked on [facility] skills grasping 1" sticks, and 1/4" collars. Pt. worked on storing the objects in the palm, and translatory skills moving the items from the palm of the hand to the tip of the 2nd digit, and thumb. Pt. worked on removing the pegs using bilateral alternating hand patterns. Pt. worked on right hand [facility] skills in order to assist with improving grasp patterns needed for manipulating pills from a pillbox.  ? ?Selfcare: ? ?Pt.  worked on Geographical information systems officer, followed by reps using a buttonhook. Pt. had difficulty buttoning small, flat dress shirt buttons requiring increased time to complete. Pt. was efficiently able to perform buttoning using a buttonhook.  ? ?Pt. was able to perform buttoning skills efficiently using a buttonhook. Pt. had increased difficulty, and required increased time to button without it. Pt. is improving with right hand [facility] skills. Pt. dropped multiple 1" sticks when performing translatory movements, moving them through the right hand from the palm to the tip of the 2nd digit, and thumb. Pt. continues to work on improving RUE strength, and [facility] skills in order to work towards improving RUE functioning, and maximizing independence with  ADLs, and IADL tasks.  ?  ? ? ? ? ? ? ? ? ? ? ? ? ? ? ? ? ? ? ? ? ? ? OT Education - 08/13/21 1116   ? ? Education Details RUE functioning.   ? Person(s) Educated Patient   ? Methods Explanation;Verbal cues;Demonstration;Tactile cues   ? Comprehension Verbalized understanding;Verbal cues required;Returned demonstration   ? ?  ?  ? ?  ? ? ? OT Short Term Goals - 03/23/21 1657   ? ?  ? OT SHORT TERM GOAL #1  ? Title Pt will perform HEP for RUE strength/coordination independently.   ? Baseline Eval: HEP not yet established in outpatient, but pt is working with putty from [name]; 12/04/2020; [facility] HEP initiated this day with mod vc after return demo; 01/05/2021: indep with currently program, but ongoing as pt progresses; 01/28/2021: vc to revisit and focus on grip and pinch strengthening with putty; 03/23/21: pt is indep with HEP   ? Time 6   ? Period Weeks   ? Status Achieved   ? Target Date 03/23/21   ? ?  ?  ? ?  ? ? ? ? OT Long Term Goals - 07/30/21 1320   ? ?  ? OT LONG TERM GOAL #1  ? Title Pt will improve hand writing to 100% legibility with R hand to be able to independently write a check.   ? Baseline Eval: signature is 75% legible with R hand, requiring extra time; 12/04/2020: no chan to complete.ge from eval; 01/05/2021: Printing is 100% legible, signature is 90% legible, but still requires extra time and effort.; 01/26/21: Signature is 90% legible and speed/fluidity have improved. 20th visit: 90% legible, 03/23/21: 90% legible, extra time 40th visit 90 % legibility with increased time, 50th visit: 90% legible with with increased time required 07/14/2021: 90% legibility. 07/30/2021: 90% legibility, pt. presses too hard through the pen, and pencil.   ? Time 12   ? Period Weeks   ? Status On-going   ? Target Date 10/06/21   ?  ? OT LONG TERM GOAL #3  ? Title Pt will improve [place] throughout RUE to enable pt to reach for and pick up ADL supplies with R dominant hand without dropping or knocking over objects.   ? Baseline Eval:  Pt reports RUE is clumsy and easily knocks over objects when trying to reach for ADL supplies.  Pt verbalizes that her "aim is off."; 12/04/2020: pt reports slight improvement since eval and has started using her R hand to eat, but still feels clumsy; 01/05/2021: Greatly improved; pt is using silverware to eat with R hand, can comb hair with RUE, still mildly ataxic; 01/26/21: pt reports difficulty picking up objects within a small space (ie; may knock the toothpaste holder down when reaching for  toothbrush), but reaching for light/larger objects is improving 20th visit: pt. continues to be mildly ataxic, however is consistently improving with motor control, and [place] while reaching targets; 03/23/21: Pt reaches with accuracy towards targets if she takes her time (pt drops and knocks things over when moving at a faster/normal pace) 04/23/2021: Pt. continues to present with difficulty with depth perception, and impaired [place].40th visit: Pt. is improving, however tends to knock over lighter weight objects.50th visit: Pt. has improved with reaching for items without knocking them over, however continues to need work on accuracy.07/14/2021: Pt. is improving while motor control, and accuracy of reaching for objects. Pt. continues to work towards improving efficiency with reaching for objects. 07/30/2021: Pt. is able to able to reach for objects more accurately, however continues to present with limited motor control in the Right.   ? Time 12   ? Period Weeks   ? Status On-going   ? Target Date 10/06/21   ?  ? OT LONG TERM GOAL #4  ? Title Pt will improve FOTO score to 68 or better to indicate measurable functional improvement.   ? Baseline Eval: FOTO 55; 01/05/2021: FOTO 61; 01/28/21: FOTO 61, 20th visit: 61; 03/23/21: FOTO 60 4oth visit: FOTO score: 61, 50th visit: FOTO 69 07/30/2021: FOTO 71   ? Time 12   ? Period Weeks   ? Status On-going   ? Target Date 10/06/21   ?  ? OT LONG TERM GOAL #6  ? Title Pt. will improve right hand  [facility] skills to be able to indepedendently manipulate and set-up her medication/pills in the pillbox.   ? Baseline 07/14/2021: Pt. has difficulty manipulating small pills, and often drops them. 07/30/2021: Pt.

## 2021-08-13 NOTE — Therapy (Signed)
[place] [facility] ?[place], [place], [address] ?Phone: [phone]   Fax:  [phone] ? ?Physical Therapy Treatment ? ?Patient Details  ?Name: Ramona Ruark Novak ?MRN: [id number] ?Date of Birth: Oct 18, 1928 ?Referring Provider (PT): Dr. Dew/Dr. Ginette Pitman PCP ? ? ?Encounter Date: 08/13/2021 ? ? PT End of Session - 08/13/21 1152   ? ? Visit Number [id number]   ? Number of Visits 70   ? Date for PT Re-Evaluation 08/20/21   ? Authorization Type [employer] Medicare   ? PT Start Time 1147   ? PT Stop Time 1228   ? PT Time Calculation (min) 41 min   ? Equipment Utilized During Treatment Gait belt   ? Activity Tolerance Patient tolerated treatment well   ? Behavior During Therapy [facility] for tasks assessed/performed   ? ?  ?  ? ?  ? ? ?Past Medical History:  ?Diagnosis Date  ? Anemia   ? Cough   ? RESOLVING, NO FEVER  ? Diabetes mellitus without complication ([place])   ? Gout   ? Hypertension   ? Hypothyroidism   ? PUD (peptic ulcer disease)   ? Stroke [facility])   ? Wears dentures   ? full upper, partial lower  ? ? ?Past Surgical History:  ?Procedure Laterality Date  ? AMPUTATION TOE Left 12/14/2017  ? Procedure: AMPUTATION TOE-4TH MPJ;  Surgeon: Samara Deist, DPM;  Location: [facility];  Service: Podiatry;  Laterality: Left;  IVA LOCAL ?Diabetic - oral meds  ? APPENDECTOMY    ? CATARACT EXTRACTION W/PHACO Right 06/23/2015  ? Procedure: CATARACT EXTRACTION PHACO AND INTRAOCULAR LENS PLACEMENT (IOC);  Surgeon: Estill Cotta, MD;  Location: [facility];  Service: Ophthalmology;  Laterality: Right;  [place] 01:28 ?AP% 23.4 ?CDE 36.14 ?fluid pack lot # [id number]  ? CATARACT EXTRACTION W/PHACO Left 07/28/2015  ? Procedure: CATARACT EXTRACTION PHACO AND INTRAOCULAR LENS PLACEMENT (IOC);  Surgeon: Estill Cotta, MD;  Location: [facility];  Service: Ophthalmology;  Laterality: Left;  [place]    1:29.7 ?AP%  24.9 ?CDE   41.32 ?fluid casette lot #[id number] H exp 09/23/2016  ? COONOSCOPY    ? AND ENDOSCOPY   ? DILATION AND CURETTAGE OF UTERUS    ? ENDARTERECTOMY Left 11/13/2020  ? Procedure: Evacuation of left neck hematoma;  Surgeon: Katha Cabal, MD;  Location: [facility];  Service: Vascular;  Laterality: Left;  ? ENDARTERECTOMY Left 11/13/2020  ? Procedure: ENDARTERECTOMY CAROTID;  Surgeon: Algernon Huxley, MD;  Location: [facility];  Service: Vascular;  Laterality: Left;  ? TONSILLECTOMY    ? TUBAL LIGATION    ? ? ?There were no vitals filed for this visit. ? ? Subjective Assessment - 08/13/21 1151   ? ? Subjective Pt reports doing well. No falls/stumbles. No pain; Reports she got in some good baby time this week with her new great-grandbaby being in town;   ? Patient is accompained by: --   Daughter, [name]  ? Pertinent History 88 y.o. female with medical history significant for type 2 diabetes mellitus, essential hypertension, acquired hypothyroidism, hyperlipidemia, stage III chronic kidney disease reports increased right sided weakness on 10/19/20. She was diagnosed with left basal ganglia ischemic stroke. She was discharged to inpatient rehab for 10 days and then discharged home. Patient was evaluated by outpatient PT on 11/12/20. CVA was due to carotid stenosis. She underwent left carotid endartectomy on 7/21. Procedure went well however patient suffered from left cervical hematoma and had subsequent surgical procedure to  stop the bleed and remove the hematoma on 11/13/20. They were concerned with her breathing and therefore intubated her for 1 day, she was in ICU for 2 days and transferred to med/surge on 11/15/20. Patient was discharged home on 11/17/20 and is now returning to outpatient PT. She has had the stiches removed and is healing well. Denies any neck pain or stiffness. She lives with her daughter and has 24/7 caregiver support. She is still using RW for most ambulation. She is able to transfer to bedside commode well and is able to stand short time unsupported. She reports feeling very weak and fatigued.  She reports she has been dragging her right foot more. She denies any numbness/tingling; She reports feeling like her legs are wanting to do more but is hesitant because of fear of falling. She is mod I for self care morning routine. She does still receive help with showering. She has been working on increasing her activity but denies any formal exercise.   ? Limitations Standing;Walking   ? How long can you sit comfortably? NA   ? How long can you stand comfortably? 30-60 min with support   ? How long can you walk comfortably? about 100 feet with RW, still limited to short distances;   ? Diagnostic tests MRI shows left basal ganglia infarct 10/21/20   ? Patient Stated Goals "Improve balance and walking and not need RW, get back to independent."   ? Currently in Pain? No/denies   ? Pain Onset In the past 7 days   ? ?  ?  ? ?  ? ? ? ? ? ? ? ? ? ? ?  ?TREATMENT: ?Warm up on Nustep BUE/BLE level 2-4, interval training x4 min with cues to keep spm >70 to challenge cardiovascular conditioning;  ?  ? ?Ex: ?Standing at parallel bars:  ?Standing on 1/2 bolster (round side up): ?-BLE heel raises x15 reps ?-BLE toe raises x15 reps ?Required BUE rail assist for safety ?Required cues to increase ROM for better ankle ROM for improved ankle strategies;  ? ?  ?NMR: ? Standing on airex pad in parallel bars: ?Feet together eyes open, unsupported 30 sec ? Progressed to unsupported standing, head turns side/side x5 reps ?Modified tandem stance unsupported standing 30 sec hold ? Progressed to unsupported standing head turns side/side x5 reps ?Pt exhibits better stance control with LLE ahead of RLE; She does require CGA to min A for safety;  ? ?Standing on LE on airex, tapping with contralateral LE on cones #3 to challenge target positioning x5 reps each with 1 rail assist;performed with each LE  to challenge strength/coordination;  ?Required min A for safety; ? ?Ladder drills: ?Forward non-reciprocal x2 laps with cues to increase step  length for better gait safety; ?Forward reciprocal x2 laps, cues to increase step length ?Required CGA to min A for safety; Patient unsupported, no AD; ?Side stepping x1 lap each direction with CGA for safety; Does report increased fatigue in legs with added exercise;  ? ?Gait around gym x80 feet  x1 sets with CGA, no assistive device, reciprocal gait pattern, does exhibit better step length and improved smoothness of gait following ladder drills. She does require CGA for safety with occasional misstep;  ?   ?Pt educated throughout session about proper posture and technique with exercises. Improved exercise technique, movement at target joints, use of target muscles after min to mod verbal, visual, tactile cues. ?  ?Patient tolerated session well. She does report mild fatigue  at end of session.  ?  ?  ?  ?  ?  ? ? ? ? ? ? ? ? ? ? ? ? ? ? ? ? ? ? PT Education - 08/13/21 1152   ? ? Education Details exercise technique/positioning;   ? Person(s) Educated Patient   ? Methods Explanation;Verbal cues   ? Comprehension Verbalized understanding;Returned demonstration;Verbal cues required;Need further instruction   ? ?  ?  ? ?  ? ? ? PT Short Term Goals - 06/25/21 1151   ? ?  ? PT SHORT TERM GOAL #1  ? Title Patient will be adherent to HEP at least 3x a week to improve functional strength and balance for better safety at home.   ? Baseline 9/21: Pt reports compliance with HEP and is confident, 1/3: doing exercise 2x a week; 06/25/21: 3x/week   ? Time 4   ? Period Weeks   ? Status Achieved   ? Target Date 06/25/21   ?  ? PT SHORT TERM GOAL #2  ? Title Patient will be independent in transferring sit<>Stand without pushing on arm rests to improve ability to get up from chair.   ? Baseline 9/21: pt able to complete STS without use of UEs, 1/3:able to stand but has moderate difficulty requiring increased time; 06/25/21: able to perform indep without hands but requiring use of momentum to attain standing. Remains stable.   ? Time 4    ? Period Weeks   ? Status Partially Met   ? Target Date 07/23/21   ? ?  ?  ? ?  ? ? ? ? PT Long Term Goals - 08/06/21 1220   ? ?  ? PT LONG TERM GOAL #1  ? Title Patient (> 55 years old) will complete fiv

## 2021-08-18 ENCOUNTER — Ambulatory Visit: Payer: Medicare HMO | Admitting: Occupational Therapy

## 2021-08-18 ENCOUNTER — Encounter: Payer: Self-pay | Admitting: Physical Therapy

## 2021-08-18 ENCOUNTER — Ambulatory Visit: Payer: Medicare HMO | Admitting: Physical Therapy

## 2021-08-18 ENCOUNTER — Encounter: Payer: Self-pay | Admitting: Occupational Therapy

## 2021-08-18 DIAGNOSIS — M6281 Muscle weakness (generalized): Secondary | ICD-10-CM | POA: Diagnosis not present

## 2021-08-18 DIAGNOSIS — R269 Unspecified abnormalities of gait and mobility: Secondary | ICD-10-CM

## 2021-08-18 DIAGNOSIS — R262 Difficulty in walking, not elsewhere classified: Secondary | ICD-10-CM

## 2021-08-18 DIAGNOSIS — R2689 Other abnormalities of gait and mobility: Secondary | ICD-10-CM

## 2021-08-18 DIAGNOSIS — R278 Other lack of coordination: Secondary | ICD-10-CM

## 2021-08-18 DIAGNOSIS — R482 Apraxia: Secondary | ICD-10-CM

## 2021-08-18 DIAGNOSIS — R2681 Unsteadiness on feet: Secondary | ICD-10-CM

## 2021-08-18 NOTE — Therapy (Signed)
[place] [facility] ?[place], [place], [address] ?Phone: [phone]   Fax:  [phone] ? ?Physical Therapy Treatment ? ?Patient Details  ?Name: Cassandra Schaefer ?MRN: [id number] ?Date of Birth: 19-Mar-1929 ?Referring Provider (PT): Dr. Dew/Dr. Ginette Pitman PCP ? ? ?Encounter Date: 08/18/2021 ? ? PT End of Session - 08/18/21 1441   ? ? Visit Number 21   ? Number of Visits 70   ? Date for PT Re-Evaluation 08/20/21   ? Authorization Type [employer] Medicare   ? PT Start Time 1432   ? PT Stop Time 1515   ? PT Time Calculation (min) 43 min   ? Equipment Utilized During Treatment Gait belt   ? Activity Tolerance Patient tolerated treatment well   ? Behavior During Therapy [facility] for tasks assessed/performed   ? ?  ?  ? ?  ? ? ?Past Medical History:  ?Diagnosis Date  ? Anemia   ? Cough   ? RESOLVING, NO FEVER  ? Diabetes mellitus without complication ([place])   ? Gout   ? Hypertension   ? Hypothyroidism   ? PUD (peptic ulcer disease)   ? Stroke [facility])   ? Wears dentures   ? full upper, partial lower  ? ? ?Past Surgical History:  ?Procedure Laterality Date  ? AMPUTATION TOE Left 12/14/2017  ? Procedure: AMPUTATION TOE-4TH MPJ;  Surgeon: Samara Deist, DPM;  Location: [facility];  Service: Podiatry;  Laterality: Left;  IVA LOCAL ?Diabetic - oral meds  ? APPENDECTOMY    ? CATARACT EXTRACTION W/PHACO Right 06/23/2015  ? Procedure: CATARACT EXTRACTION PHACO AND INTRAOCULAR LENS PLACEMENT (IOC);  Surgeon: Estill Cotta, MD;  Location: [facility];  Service: Ophthalmology;  Laterality: Right;  [place] 01:28 ?AP% 23.4 ?CDE 36.14 ?fluid pack lot # [id number]  ? CATARACT EXTRACTION W/PHACO Left 07/28/2015  ? Procedure: CATARACT EXTRACTION PHACO AND INTRAOCULAR LENS PLACEMENT (IOC);  Surgeon: Estill Cotta, MD;  Location: [facility];  Service: Ophthalmology;  Laterality: Left;  [place]    1:29.7 ?AP%  24.9 ?CDE   41.32 ?fluid casette lot #[id number] H exp 09/23/2016  ? COONOSCOPY    ? AND ENDOSCOPY   ? DILATION AND CURETTAGE OF UTERUS    ? ENDARTERECTOMY Left 11/13/2020  ? Procedure: Evacuation of left neck hematoma;  Surgeon: Katha Cabal, MD;  Location: [facility];  Service: Vascular;  Laterality: Left;  ? ENDARTERECTOMY Left 11/13/2020  ? Procedure: ENDARTERECTOMY CAROTID;  Surgeon: Algernon Huxley, MD;  Location: [facility];  Service: Vascular;  Laterality: Left;  ? TONSILLECTOMY    ? TUBAL LIGATION    ? ? ?There were no vitals filed for this visit. ? ? Subjective Assessment - 08/18/21 1438   ? ? Subjective Patient reports doing well. no new falls or stumbles. No pain; Reports having a good weekend with family;   ? Patient is accompained by: --   Daughter, [name]  ? Pertinent History 88 y.o. female with medical history significant for type 2 diabetes mellitus, essential hypertension, acquired hypothyroidism, hyperlipidemia, stage III chronic kidney disease reports increased right sided weakness on 10/19/20. She was diagnosed with left basal ganglia ischemic stroke. She was discharged to inpatient rehab for 10 days and then discharged home. Patient was evaluated by outpatient PT on 11/12/20. CVA was due to carotid stenosis. She underwent left carotid endartectomy on 7/21. Procedure went well however patient suffered from left cervical hematoma and had subsequent surgical procedure to stop the bleed and remove the hematoma  on 11/13/20. They were concerned with her breathing and therefore intubated her for 1 day, she was in ICU for 2 days and transferred to med/surge on 11/15/20. Patient was discharged home on 11/17/20 and is now returning to outpatient PT. She has had the stiches removed and is healing well. Denies any neck pain or stiffness. She lives with her daughter and has 24/7 caregiver support. She is still using RW for most ambulation. She is able to transfer to bedside commode well and is able to stand short time unsupported. She reports feeling very weak and fatigued. She reports she has been dragging her  right foot more. She denies any numbness/tingling; She reports feeling like her legs are wanting to do more but is hesitant because of fear of falling. She is mod I for self care morning routine. She does still receive help with showering. She has been working on increasing her activity but denies any formal exercise.   ? Limitations Standing;Walking   ? How long can you sit comfortably? NA   ? How long can you stand comfortably? 30-60 min with support   ? How long can you walk comfortably? about 100 feet with RW, still limited to short distances;   ? Diagnostic tests MRI shows left basal ganglia infarct 10/21/20   ? Patient Stated Goals "Improve balance and walking and not need RW, get back to independent."   ? Currently in Pain? No/denies   ? Pain Onset In the past 7 days   ? ?  ?  ? ?  ? ? ? ? ? ?  ?  ?  ?TREATMENT: ?Warm up on Nustep BUE/BLE level 2 x4 min with cues to keep spm >70 to challenge cardiovascular conditioning;  ?  ? NMR: ?Standing at parallel bars:  ?Standing on 1/2 bolster (flat side up) ?Feet in neutral- heel/toe rock x15 reps with BUE rail assist; ? ?Standing on 1/2 bolster (round side up): ?-BLE heel raises x15 reps ?-BLE toe raises x15 reps ?Required BUE rail assist for safety ?Required cues to increase ROM for better ankle ROM for improved ankle strategies;  ?  ?Rockerboard: ?Standing on incline unsupported, 30 sec x2 sets- initially posterior loss of balance after 5 sec, requiring CGA to min A and several attempts, by 2nd rep, able to hold up to 15 sec, does report moderate difficulty; ?Forward/backwrad weight shift in staggered stance with 1-0 rail assist x15 reps each foot in front, increased ease with LLE ahead of RLE; ? ? ? Standing on BOSU ball in parallel bars: ?Feet apart: ?Eyes open 30 sec hold unsupported, CGA to min A with increased lateral instability; ? Progressed with head turns side/side x3 reps with increased loss of balance requiring min A for recovery ? Progressed with mini  squat x10 reps, min a for safety; ?  ?Obstacle course: ?Weaving around cones #5, stepping over 1/2 bolster, stepping up and over airex pad x3 reps,  ?Backward weaving through cones #5 x1 set ?Forward weaving through cones #5 x2 sets; ? ?Gait around gym x80 feet  x1 sets with CGA, no assistive device, reciprocal gait pattern, does exhibit better step length and improved smoothness of gait following ladder drills. She does require CGA for safety with occasional misstep;  ?   ?Pt educated throughout session about proper posture and technique with exercises. Improved exercise technique, movement at target joints, use of target muscles after min to mod verbal, visual, tactile cues. ?  ?Patient tolerated session well. She does report  mild fatigue at end of session.  ?  ? ? ? ? ? ? ? ? ? ? ? ? ? ? ? ? ? ? ? ? ? ? ? PT Education - 08/18/21 1438   ? ? Education Details exercise technique/positioning;   ? Person(s) Educated Patient   ? Methods Explanation;Verbal cues   ? Comprehension Verbalized understanding;Returned demonstration;Verbal cues required;Need further instruction   ? ?  ?  ? ?  ? ? ? PT Short Term Goals - 06/25/21 1151   ? ?  ? PT SHORT TERM GOAL #1  ? Title Patient will be adherent to HEP at least 3x a week to improve functional strength and balance for better safety at home.   ? Baseline 9/21: Pt reports compliance with HEP and is confident, 1/3: doing exercise 2x a week; 06/25/21: 3x/week   ? Time 4   ? Period Weeks   ? Status Achieved   ? Target Date 06/25/21   ?  ? PT SHORT TERM GOAL #2  ? Title Patient will be independent in transferring sit<>Stand without pushing on arm rests to improve ability to get up from chair.   ? Baseline 9/21: pt able to complete STS without use of UEs, 1/3:able to stand but has moderate difficulty requiring increased time; 06/25/21: able to perform indep without hands but requiring use of momentum to attain standing. Remains stable.   ? Time 4   ? Period Weeks   ? Status Partially  Met   ? Target Date 07/23/21   ? ?  ?  ? ?  ? ? ? ? PT Long Term Goals - 08/06/21 1220   ? ?  ? PT LONG TERM GOAL #1  ? Title Patient (> 30 years old) will complete five times sit to stand test in < 15 secon

## 2021-08-18 NOTE — Therapy (Signed)
[place] ?[facility] ?[address] ?[place], [place], [address] ?Phone: [phone]   Fax:  [phone] ? ?Occupational Therapy Treatment ? ?Patient Details  ?Name: Cassandra Schaefer ?MRN: [id number] ?Date of Birth: May 28, 1928 ?Referring Provider (OT): Dr. Barbette Reichmann ? ? ?Encounter Date: 08/18/2021 ? ? OT End of Session - 08/18/21 1536   ? ? Visit Number [id number]   ? Number of Visits 90   ? Date for OT Re-Evaluation 10/06/21   ? OT Start Time 1520   ? OT Stop Time 1607  ? OT Time Calculation (min) 47 min   ? Activity Tolerance Patient tolerated treatment well   ? Behavior During Therapy [facility] for tasks assessed/performed   ? ?  ?  ? ?  ? ? ?Past Medical History:  ?Diagnosis Date  ? Anemia   ? Cough   ? RESOLVING, NO FEVER  ? Diabetes mellitus without complication (HCC)   ? Gout   ? Hypertension   ? Hypothyroidism   ? PUD (peptic ulcer disease)   ? Stroke [facility])   ? Wears dentures   ? full upper, partial lower  ? ? ?Past Surgical History:  ?Procedure Laterality Date  ? AMPUTATION TOE Left 12/14/2017  ? Procedure: AMPUTATION TOE-4TH MPJ;  Surgeon: Gwyneth Revels, DPM;  Location: [facility];  Service: Podiatry;  Laterality: Left;  IVA LOCAL ?Diabetic - oral meds  ? APPENDECTOMY    ? CATARACT EXTRACTION W/PHACO Right 06/23/2015  ? Procedure: CATARACT EXTRACTION PHACO AND INTRAOCULAR LENS PLACEMENT (IOC);  Surgeon: Sallee Lange, MD;  Location: [facility];  Service: Ophthalmology;  Laterality: Right;  [place] 01:28 ?AP% 23.4 ?CDE 36.14 ?fluid pack lot # [id number]  ? CATARACT EXTRACTION W/PHACO Left 07/28/2015  ? Procedure: CATARACT EXTRACTION PHACO AND INTRAOCULAR LENS PLACEMENT (IOC);  Surgeon: Sallee Lange, MD;  Location: [facility];  Service: Ophthalmology;  Laterality: Left;  [place]    1:29.7 ?AP%  24.9 ?CDE   41.32 ?fluid casette lot #[id number] H exp 09/23/2016  ? COONOSCOPY    ? AND ENDOSCOPY  ? DILATION AND CURETTAGE OF UTERUS    ? ENDARTERECTOMY Left 11/13/2020  ? Procedure:  Evacuation of left neck hematoma;  Surgeon: Renford Dills, MD;  Location: [facility];  Service: Vascular;  Laterality: Left;  ? ENDARTERECTOMY Left 11/13/2020  ? Procedure: ENDARTERECTOMY CAROTID;  Surgeon: Annice Needy, MD;  Location: [facility];  Service: Vascular;  Laterality: Left;  ? TONSILLECTOMY    ? TUBAL LIGATION    ? ? ?There were no vitals filed for this visit. ? ? Subjective Assessment - 08/18/21 1533   ? ? Subjective  Pt. reports that her granddaughter, and infant great granddaughter have returned home after visiting.   ? Patient is accompanied by: Family member   ? Pertinent History R ankle pain, gout, T2DM, HTN, CKDIII; R/L carotid endarectomy   ? Patient Stated Goals "I want to improve my aim when I'm using my R arm."   ? Currently in Pain? No/denies   ? Pain Score 0-No pain   ? ?  ?  ? ?  ? ?OT TREATMENT   ? ?Neuro muscular re-education: ? ?Pt. worked on right hand [facility] skills grasping small resistive earring backings, and placing them onto a small dowel. Pt. worked on grasping them small earring backings from a small square container with the right 2nd digit, and thumb. Pt. worked on removing the resistive earring backings from the dowel.  ? ?Pt. continues to make  progress with right UE functioning. Pt. continues to have difficulty grasping small earring backings from the 1" square container with her 2nd digit, and thumb. Pt. required increased time, and difficulty initially to place the resistive backings on the dowel. Pt. Became more accurate as the task progressed. Pt. continues to work on improving BUE functioning in order to work towards improving, and maximizing independence with ADLs, and IADL tasks.  ? ? ?  ? ? ? ? ? ? ? ? ? ? ? ? ? ? ? ? ? ? ? ? ? OT Education - 08/18/21 1536   ? ? Education Details RUE functioning.   ? Person(s) Educated Patient   ? Methods Explanation;Verbal cues;Demonstration;Tactile cues   ? Comprehension Verbalized understanding;Verbal cues required;Returned  demonstration   ? ?  ?  ? ?  ? ? ? OT Short Term Goals - 03/23/21 1657   ? ?  ? OT SHORT TERM GOAL #1  ? Title Pt will perform HEP for RUE strength/coordination independently.   ? Baseline Eval: HEP not yet established in outpatient, but pt is working with putty from [name]; 12/04/2020; [facility] HEP initiated this day with mod vc after return demo; 01/05/2021: indep with currently program, but ongoing as pt progresses; 01/28/2021: vc to revisit and focus on grip and pinch strengthening with putty; 03/23/21: pt is indep with HEP   ? Time 6   ? Period Weeks   ? Status Achieved   ? Target Date 03/23/21   ? ?  ?  ? ?  ? ? ? ? OT Long Term Goals - 07/30/21 1320   ? ?  ? OT LONG TERM GOAL #1  ? Title Pt will improve hand writing to 100% legibility with R hand to be able to independently write a check.   ? Baseline Eval: signature is 75% legible with R hand, requiring extra time; 12/04/2020: no chan to complete.ge from eval; 01/05/2021: Printing is 100% legible, signature is 90% legible, but still requires extra time and effort.; 01/26/21: Signature is 90% legible and speed/fluidity have improved. 20th visit: 90% legible, 03/23/21: 90% legible, extra time 40th visit 90 % legibility with increased time, 50th visit: 90% legible with with increased time required 07/14/2021: 90% legibility. 07/30/2021: 90% legibility, pt. presses too hard through the pen, and pencil.   ? Time 12   ? Period Weeks   ? Status On-going   ? Target Date 10/06/21   ?  ? OT LONG TERM GOAL #3  ? Title Pt will improve GMC throughout RUE to enable pt to reach for and pick up ADL supplies with R dominant hand without dropping or knocking over objects.   ? Baseline Eval: Pt reports RUE is clumsy and easily knocks over objects when trying to reach for ADL supplies.  Pt verbalizes that her "aim is off."; 12/04/2020: pt reports slight improvement since eval and has started using her R hand to eat, but still feels clumsy; 01/05/2021: Greatly improved; pt is using silverware to  eat with R hand, can comb hair with RUE, still mildly ataxic; 01/26/21: pt reports difficulty picking up objects within a small space (ie; may knock the toothpaste holder down when reaching for toothbrush), but reaching for light/larger objects is improving 20th visit: pt. continues to be mildly ataxic, however is consistently improving with motor control, and FMC while reaching targets; 03/23/21: Pt reaches with accuracy towards targets if she takes her time (pt drops and knocks things over when moving at a  faster/normal pace) 04/23/2021: Pt. continues to present with difficulty with depth perception, and impaired FMC.40th visit: Pt. is improving, however tends to knock over lighter weight objects.50th visit: Pt. has improved with reaching for items without knocking them over, however continues to need work on accuracy.07/14/2021: Pt. is improving while motor control, and accuracy of reaching for objects. Pt. continues to work towards improving efficiency with reaching for objects. 07/30/2021: Pt. is able to able to reach for objects more accurately, however continues to present with limited motor control in the Right.   ? Time 12   ? Period Weeks   ? Status On-going   ? Target Date 10/06/21   ?  ? OT LONG TERM GOAL #4  ? Title Pt will improve FOTO score to 68 or better to indicate measurable functional improvement.   ? Baseline Eval: FOTO 55; 01/05/2021: FOTO 61; 01/28/21: FOTO 61, 20th visit: 61; 03/23/21: FOTO 60 4oth visit: FOTO score: 61, 50th visit: FOTO 69 07/30/2021: FOTO 71   ? Time 12   ? Period Weeks   ? Status On-going   ? Target Date 10/06/21   ?  ? OT LONG TERM GOAL #6  ? Title Pt. will improve [facility] skills to be able to indepedendently manipulate and set-up her medication/pills in the pillbox.   ? Baseline 07/14/2021: Pt. has difficulty manipulating small pills, and often drops them. 07/30/2021: Pt. continues to have difficulty grasping pills/medication for set-up of pillbox.   ? Time 12   ? Period  Weeks   ? Status New   ? Target Date 10/06/21   ? ?  ?  ? ?  ? ? ? ? ? ? ? ? Plan - 08/18/21 1549   ? ? Clinical Impression Statement Pt. continues to make progress with right UE functioning. Pt. continues to hav

## 2021-08-20 ENCOUNTER — Ambulatory Visit: Payer: Medicare HMO | Admitting: Occupational Therapy

## 2021-08-20 ENCOUNTER — Ambulatory Visit: Payer: Medicare HMO | Admitting: Physical Therapy

## 2021-08-20 ENCOUNTER — Encounter: Payer: Self-pay | Admitting: Occupational Therapy

## 2021-08-20 ENCOUNTER — Encounter: Payer: Self-pay | Admitting: Physical Therapy

## 2021-08-20 DIAGNOSIS — R2681 Unsteadiness on feet: Secondary | ICD-10-CM

## 2021-08-20 DIAGNOSIS — R2689 Other abnormalities of gait and mobility: Secondary | ICD-10-CM

## 2021-08-20 DIAGNOSIS — M6281 Muscle weakness (generalized): Secondary | ICD-10-CM | POA: Diagnosis not present

## 2021-08-20 DIAGNOSIS — R482 Apraxia: Secondary | ICD-10-CM

## 2021-08-20 DIAGNOSIS — R262 Difficulty in walking, not elsewhere classified: Secondary | ICD-10-CM

## 2021-08-20 DIAGNOSIS — R269 Unspecified abnormalities of gait and mobility: Secondary | ICD-10-CM

## 2021-08-20 DIAGNOSIS — R278 Other lack of coordination: Secondary | ICD-10-CM

## 2021-08-20 NOTE — Therapy (Signed)
?[place] [facility] ?[place], [place], [address] ?Phone: [phone]   Fax:  [phone] ? ?Physical Therapy Treatment ? ?Patient Details  ?Name: Cassandra Schaefer ?MRN: [id number] ?Date of Birth: 14-Mar-1929 ?Referring Provider (PT): Dr. Dew/Dr. Ginette Pitman PCP ? ? ?Encounter Date: 08/20/2021 ? ? PT End of Session - 08/20/21 1302   ? ? Visit Number 23   ? Number of Visits 70   ? Date for PT Re-Evaluation 08/20/21   ? Authorization Type [employer]   ? PT Start Time 1346   ? PT Stop Time 1430   ? PT Time Calculation (min) 44 min   ? Equipment Utilized During Treatment Gait belt   ? Activity Tolerance Patient tolerated treatment well   ? Behavior During Therapy [facility] for tasks assessed/performed   ? ?  ?  ? ?  ? ? ?Past Medical History:  ?Diagnosis Date  ? Anemia   ? Cough   ? RESOLVING, NO FEVER  ? Diabetes mellitus without complication ([place])   ? Gout   ? Hypertension   ? Hypothyroidism   ? PUD (peptic ulcer disease)   ? Stroke [facility])   ? Wears dentures   ? full upper, partial lower  ? ? ?Past Surgical History:  ?Procedure Laterality Date  ? AMPUTATION TOE Left 12/14/2017  ? Procedure: AMPUTATION TOE-4TH MPJ;  Surgeon: Samara Deist, DPM;  Location: [place];  Service: Podiatry;  Laterality: Left;  IVA LOCAL ?Diabetic - oral meds  ? APPENDECTOMY    ? CATARACT EXTRACTION W/PHACO Right 06/23/2015  ? Procedure: CATARACT EXTRACTION PHACO AND INTRAOCULAR LENS PLACEMENT (IOC);  Surgeon: Estill Cotta, MD;  Location: [facility];  Service: Ophthalmology;  Laterality: Right;  [place] 01:28 ?AP% 23.4 ?CDE 36.14 ?fluid pack lot # [id number]  ? CATARACT EXTRACTION W/PHACO Left 07/28/2015  ? Procedure: CATARACT EXTRACTION PHACO AND INTRAOCULAR LENS PLACEMENT (IOC);  Surgeon: Estill Cotta, MD;  Location: [facility];  Service: Ophthalmology;  Laterality: Left;  [place]    1:29.7 ?AP%  24.9 ?CDE   41.32 ?fluid casette lot #[id number] H exp 09/23/2016  ? COONOSCOPY    ? AND ENDOSCOPY   ? DILATION AND CURETTAGE OF UTERUS    ? ENDARTERECTOMY Left 11/13/2020  ? Procedure: Evacuation of left neck hematoma;  Surgeon: Katha Cabal, MD;  Location: [facility];  Service: Vascular;  Laterality: Left;  ? ENDARTERECTOMY Left 11/13/2020  ? Procedure: ENDARTERECTOMY CAROTID;  Surgeon: Algernon Huxley, MD;  Location: [facility];  Service: Vascular;  Laterality: Left;  ? TONSILLECTOMY    ? TUBAL LIGATION    ? ? ?There were no vitals filed for this visit. ? ? Subjective Assessment - 08/20/21 1354   ? ? Subjective Patient reports doing well. no new falls or stumbles. No pain; Reports being a little stiff from wet weather;   ? Patient is accompained by: --   Daughter, Cassandra Schaefer  ? Pertinent History 88 y.o. female with medical history significant for type 2 diabetes mellitus, essential hypertension, acquired hypothyroidism, hyperlipidemia, stage III chronic kidney disease reports increased right sided weakness on 10/19/20. She was diagnosed with left basal ganglia ischemic stroke. She was discharged to inpatient rehab for 10 days and then discharged home. Patient was evaluated by outpatient PT on 11/12/20. CVA was due to carotid stenosis. She underwent left carotid endartectomy on 7/21. Procedure went well however patient suffered from left cervical hematoma and had subsequent surgical procedure to stop the bleed and remove the  hematoma on 11/13/20. They were concerned with her breathing and therefore intubated her for 1 day, she was in ICU for 2 days and transferred to med/surge on 11/15/20. Patient was discharged home on 11/17/20 and is now returning to outpatient PT. She has had the stiches removed and is healing well. Denies any neck pain or stiffness. She lives with her daughter and has 24/7 caregiver support. She is still using RW for most ambulation. She is able to transfer to bedside commode well and is able to stand short time unsupported. She reports feeling very weak and fatigued. She reports she has been dragging  her right foot more. She denies any numbness/tingling; She reports feeling like her legs are wanting to do more but is hesitant because of fear of falling. She is mod I for self care morning routine. She does still receive help with showering. She has been working on increasing her activity but denies any formal exercise.   ? Limitations Standing;Walking   ? How long can you sit comfortably? NA   ? How long can you stand comfortably? 30-60 min with support   ? How long can you walk comfortably? about 100 feet with RW, still limited to short distances;   ? Diagnostic tests MRI shows left basal ganglia infarct 10/21/20   ? Patient Stated Goals "Improve balance and walking and not need RW, get back to independent."   ? Currently in Pain? No/denies   ? Pain Onset In the past 7 days   ? ?  ?  ? ?  ? ? ? ? ? ? ?  ?  ?TREATMENT: ?Warm up on Nustep (moist heat to low back concurrent with exercise), BUE/BLE level 2-4 interval training x4 min with cues to keep spm >70 to challenge cardiovascular conditioning;  ?  ? NMR: ? ?Standing on 1/2 bolster (round side up): ?-BLE heel raises x15 reps ?-BLE toe raises x15 reps ?Required BUE rail assist for safety ?Required cues to increase ROM for better ankle ROM for improved ankle strategies;  ?  ?Standing on airex: ?-toe taps to 6 inch step with cone to challenge target/foot clearance x10 reps each LE, able to progress to 1 UE assist when stepping with LLE; ?-tandem stance with 1-0 rail assist 30 sec hold x1 rep each foot in front;  ?  ? ?Start step outs (multi-directional stepping) ?X2 sets each LE with min A for safety and cues for increased step length for better positioning and to challenge motor control; Patient exhibits increased difficulty achieving targets on RLE requiring increased repetition;  ?  ?Instructed patient in gait crossovers: ?X5 feet x4 laps each direction with BUE rail assist; ? ?Forward/backward stepping unsupported x10 feet x3 laps with CGA for safety with  better reciprocal pattern walking backward; ?  ?Gait around gym x80 feet  x1 sets with CGA, no assistive device, reciprocal gait pattern, does exhibit better step length and improved smoothness of gait following ladder drills. She does require CGA for safety with occasional misstep;  ?   ?Pt educated throughout session about proper posture and technique with exercises. Improved exercise technique, movement at target joints, use of target muscles after min to mod verbal, visual, tactile cues. ?  ?Patient tolerated session well. She does report mild fatigue at end of session.  ?  ?  ?  ?  ?  ? ? ? ? ? ? ? ? ? ? ? ? ? ? ? ? ? ? ? ? ? ? PT  Education - 08/20/21 1302   ? ? Education Details exercise technique/positioning;   ? Person(s) Educated Patient   ? Methods Explanation;Verbal cues   ? Comprehension Verbalized understanding;Returned demonstration;Verbal cues required;Need further instruction   ? ?  ?  ? ?  ? ? ? PT Short Term Goals - 06/25/21 1151   ? ?  ? PT SHORT TERM GOAL #1  ? Title Patient will be adherent to HEP at least 3x a week to improve functional strength and balance for better safety at home.   ? Baseline 9/21: Pt reports compliance with HEP and is confident, 1/3: doing exercise 2x a week; 06/25/21: 3x/week   ? Time 4   ? Period Weeks   ? Status Achieved   ? Target Date 06/25/21   ?  ? PT SHORT TERM GOAL #2  ? Title Patient will be independent in transferring sit<>Stand without pushing on arm rests to improve ability to get up from chair.   ? Baseline 9/21: pt able to complete STS without use of UEs, 1/3:able to stand but has moderate difficulty requiring increased time; 06/25/21: able to perform indep without hands but requiring use of momentum to attain standing. Remains stable.   ? Time 4   ? Period Weeks   ? Status Partially Met   ? Target Date 07/23/21   ? ?  ?  ? ?  ? ? ? ? PT Long Term Goals - 08/06/21 1220   ? ?  ? PT LONG TERM GOAL #1  ? Title Patient (> 25 years old) will complete five times sit  to stand test in < 15 seconds indicating an increased LE strength and improved balance.   ? Baseline 9/21: 29 seconds hands-free 10/10: deferred 10/12: 34 sec hands free; 11/2: 23.8 sec hands-free; 12/7: 33.2

## 2021-08-20 NOTE — Therapy (Signed)
[place] ?[facility] ?[address] ?[place], [place], [address] ?Phone: [phone]   Fax:  [phone] ? ?Occupational Therapy Treatment ? ?Patient Details  ?Name: Cassandra Schaefer ?MRN: [id number] ?Date of Birth: 1928-11-12 ?Referring Provider (OT): Dr. Barbette Reichmann ? ? ?Encounter Date: 08/20/2021 ? ? OT End of Session - 08/20/21 1336   ? ? Visit Number [id number]   ? Number of Visits 90   ? Date for OT Re-Evaluation 10/06/21   ? Authorization Time Period Reporting period starting 06/18/2020   ? OT Start Time 1300   ? OT Stop Time 1345   ? OT Time Calculation (min) 45 min   ? Activity Tolerance Patient tolerated treatment well   ? Behavior During Therapy [facility] for tasks assessed/performed   ? ?  ?  ? ?  ? ? ?Past Medical History:  ?Diagnosis Date  ? Anemia   ? Cough   ? RESOLVING, NO FEVER  ? Diabetes mellitus without complication (HCC)   ? Gout   ? Hypertension   ? Hypothyroidism   ? PUD (peptic ulcer disease)   ? Stroke [facility])   ? Wears dentures   ? full upper, partial lower  ? ? ?Past Surgical History:  ?Procedure Laterality Date  ? AMPUTATION TOE Left 12/14/2017  ? Procedure: AMPUTATION TOE-4TH MPJ;  Surgeon: Gwyneth Revels, DPM;  Location: [facility];  Service: Podiatry;  Laterality: Left;  IVA LOCAL ?Diabetic - oral meds  ? APPENDECTOMY    ? CATARACT EXTRACTION W/PHACO Right 06/23/2015  ? Procedure: CATARACT EXTRACTION PHACO AND INTRAOCULAR LENS PLACEMENT (IOC);  Surgeon: Sallee Lange, MD;  Location: [facility];  Service: Ophthalmology;  Laterality: Right;  [place] 01:28 ?AP% 23.4 ?CDE 36.14 ?fluid pack lot # [id number]  ? CATARACT EXTRACTION W/PHACO Left 07/28/2015  ? Procedure: CATARACT EXTRACTION PHACO AND INTRAOCULAR LENS PLACEMENT (IOC);  Surgeon: Sallee Lange, MD;  Location: [facility];  Service: Ophthalmology;  Laterality: Left;  [place]    1:29.7 ?AP%  24.9 ?CDE   41.32 ?fluid casette lot #[id number] H exp 09/23/2016  ? COONOSCOPY    ? AND ENDOSCOPY  ? DILATION AND  CURETTAGE OF UTERUS    ? ENDARTERECTOMY Left 11/13/2020  ? Procedure: Evacuation of left neck hematoma;  Surgeon: Renford Dills, MD;  Location: [facility];  Service: Vascular;  Laterality: Left;  ? ENDARTERECTOMY Left 11/13/2020  ? Procedure: ENDARTERECTOMY CAROTID;  Surgeon: Annice Needy, MD;  Location: [facility];  Service: Vascular;  Laterality: Left;  ? TONSILLECTOMY    ? TUBAL LIGATION    ? ? ?There were no vitals filed for this visit. ? ? Subjective Assessment - 08/20/21 1808   ? ? Subjective  Pt. reports doing well today.   ? Patient is accompanied by: Family member   ? Pertinent History R ankle pain, gout, T2DM, HTN, CKDIII; R/L carotid endarectomy   ? Patient Stated Goals "I want to improve my aim when I'm using my R arm."   ? Currently in Pain? No/denies   ? ?  ?  ? ?  ?OT Treatment ? ?Therapeutic Ex: ? ?Pt. Worked on pinch strengthening in the right hand for lateral, and 3pt. pinch using yellow, red, green, and blue resistive clips. Pt. worked on placing the clips at various vertical and horizontal angles. Tactile and verbal cues were required for eliciting the desired movement. Pt. Worked on accuracy with reaching for the targets.  ? ?Neuromuscular re-education:  ?  ?Pt. worked on right  hand [facility] skills grasping 1",3/4", and 1/2" washers from a magnetic dish, and reaching up to place them onto horizontal, and vertical dowels placed at various angles. Pt. worked on right hand [facility] skills grasping small beads to simulate medication/pills from the pillbox, and sorting them into the bottles. Pt. had more difficulty removing the beads ones at a time from the pillbox when multiple pills are in one section. Pt. worked on grasping them, storing them in her right palm,  and  performing translatory movements prior to sorting them into the bottles.  Pt. worked on isolating her 2nd digit, and retrieved the small simulated medication/beads from a standard medication bottle. Pt. Occasionally dropped multiple flat  circular beads when trying to separate them from the sphere shaped ones. Pt. Worked on right hand [facility] skills grasping small earring backings, and placing them onto a thin dowel to simulate donning earrings. ?  ?Pt. Is improving with functional reaching, and accuracy with placing items at designated targets. Pt. Occasionally undershoots the targets in front of her, and to the left. Pt.  Dropped multiple washers. Pt. alternates between using her 2nd digit, and 3rd digits to grasp the beads when there are multiple beads in the pillbox.  Pt. Was able to use her 2nd, and 3rd digits for grasping when more than 2 pills were in the pillbox square, however dropped multiple simulated pills. Pt. dropped fewer simulated pills when attempting to retrieve them from the medication bottle with an isolated 2nd digit. Pt. Required increased time, and cues to stabilize the earring backing when donning it over the dowel. Pt. Continues to work on improving right hand function, and [facility] skills in order to work towards improving, and maximizing independence with ADLs,  and IADL tasks ?  ?  ? ? ? ? ? ? ? ? ? ? ? ? ? ? ? ? ? ? ? ? ? OT Education - 08/20/21 1336   ? ? Education Details RUE functioning.   ? Person(s) Educated Patient   ? Methods Explanation;Verbal cues;Demonstration;Tactile cues   ? Comprehension Verbalized understanding;Verbal cues required;Returned demonstration   ? ?  ?  ? ?  ? ? ? OT Short Term Goals - 03/23/21 1657   ? ?  ? OT SHORT TERM GOAL #1  ? Title Pt will perform HEP for RUE strength/coordination independently.   ? Baseline Eval: HEP not yet established in outpatient, but pt is working with putty from [name]; 12/04/2020; [facility] HEP initiated this day with mod vc after return demo; 01/05/2021: indep with currently program, but ongoing as pt progresses; 01/28/2021: vc to revisit and focus on grip and pinch strengthening with putty; 03/23/21: pt is indep with HEP   ? Time 6   ? Period Weeks   ? Status Achieved   ? Target Date  03/23/21   ? ?  ?  ? ?  ? ? ? ? OT Long Term Goals - 07/30/21 1320   ? ?  ? OT LONG TERM GOAL #1  ? Title Pt will improve hand writing to 100% legibility with R hand to be able to independently write a check.   ? Baseline Eval: signature is 75% legible with R hand, requiring extra time; 12/04/2020: no chan to complete.ge from eval; 01/05/2021: Printing is 100% legible, signature is 90% legible, but still requires extra time and effort.; 01/26/21: Signature is 90% legible and speed/fluidity have improved. 20th visit: 90% legible, 03/23/21: 90% legible, extra time 40th visit 90 % legibility with  increased time, 50th visit: 90% legible with with increased time required 07/14/2021: 90% legibility. 07/30/2021: 90% legibility, pt. presses too hard through the pen, and pencil.   ? Time 12   ? Period Weeks   ? Status On-going   ? Target Date 10/06/21   ?  ? OT LONG TERM GOAL #3  ? Title Pt will improve GMC throughout RUE to enable pt to reach for and pick up ADL supplies with R dominant hand without dropping or knocking over objects.   ? Baseline Eval: Pt reports RUE is clumsy and easily knocks over objects when trying to reach for ADL supplies.  Pt verbalizes that her "aim is off."; 12/04/2020: pt reports slight improvement since eval and has started using her R hand to eat, but still feels clumsy; 01/05/2021: Greatly improved; pt is using silverware to eat with R hand, can comb hair with RUE, still mildly ataxic; 01/26/21: pt reports difficulty picking up objects within a small space (ie; may knock the toothpaste holder down when reaching for toothbrush), but reaching for light/larger objects is improving 20th visit: pt. continues to be mildly ataxic, however is consistently improving with motor control, and FMC while reaching targets; 03/23/21: Pt reaches with accuracy towards targets if she takes her time (pt drops and knocks things over when moving at a faster/normal pace) 04/23/2021: Pt. continues to present with difficulty  with depth perception, and impaired FMC.40th visit: Pt. is improving, however tends to knock over lighter weight objects.50th visit: Pt. has improved with reaching for items without knocking them over, however co

## 2021-08-20 NOTE — Patient Instructions (Signed)
Access Code: [email or web address] ?URL: [email or web address] ?Date: 08/20/2021 ?Prepared by: Zettie Pho ? ?Exercises ?- Carioca with Counter Support  - 1 x daily - 4 x weekly - 1 sets - 3-5 reps ?

## 2021-08-25 ENCOUNTER — Ambulatory Visit: Payer: Medicare HMO | Admitting: Occupational Therapy

## 2021-08-25 ENCOUNTER — Ambulatory Visit: Payer: Medicare HMO | Attending: Vascular Surgery | Admitting: Physical Therapy

## 2021-08-25 ENCOUNTER — Encounter: Payer: Self-pay | Admitting: Occupational Therapy

## 2021-08-25 ENCOUNTER — Encounter: Payer: Self-pay | Admitting: Physical Therapy

## 2021-08-25 DIAGNOSIS — R262 Difficulty in walking, not elsewhere classified: Secondary | ICD-10-CM | POA: Diagnosis present

## 2021-08-25 DIAGNOSIS — R2681 Unsteadiness on feet: Secondary | ICD-10-CM | POA: Insufficient documentation

## 2021-08-25 DIAGNOSIS — R2689 Other abnormalities of gait and mobility: Secondary | ICD-10-CM | POA: Insufficient documentation

## 2021-08-25 DIAGNOSIS — R278 Other lack of coordination: Secondary | ICD-10-CM | POA: Diagnosis present

## 2021-08-25 DIAGNOSIS — M6281 Muscle weakness (generalized): Secondary | ICD-10-CM | POA: Insufficient documentation

## 2021-08-25 DIAGNOSIS — R482 Apraxia: Secondary | ICD-10-CM | POA: Diagnosis present

## 2021-08-25 DIAGNOSIS — R269 Unspecified abnormalities of gait and mobility: Secondary | ICD-10-CM | POA: Diagnosis present

## 2021-08-25 NOTE — Therapy (Signed)
[place] ?[facility] ?[address] ?[place], [place], [address] ?Phone: [phone]   Fax:  [phone] ? ?Occupational Therapy Treatment ? ?Patient Details  ?Name: Cassandra Schaefer ?MRN: [id number] ?Date of Birth: Sep 04, 1928 ?Referring Provider (OT): Dr. Barbette Reichmann ? ? ?Encounter Date: 08/25/2021 ? ? OT End of Session - 08/25/21 1305   ? ? Visit Number [id number]   ? Number of Visits 90   ? Date for OT Re-Evaluation 10/06/21   ? Authorization Time Period Reporting period starting 06/18/2020   ? OT Start Time 1302   ? OT Stop Time 1345   ? OT Time Calculation (min) 43 min   ? Activity Tolerance Patient tolerated treatment well   ? Behavior During Therapy [facility] for tasks assessed/performed   ? ?  ?  ? ?  ? ? ?Past Medical History:  ?Diagnosis Date  ? Anemia   ? Cough   ? RESOLVING, NO FEVER  ? Diabetes mellitus without complication (HCC)   ? Gout   ? Hypertension   ? Hypothyroidism   ? PUD (peptic ulcer disease)   ? Stroke [facility])   ? Wears dentures   ? full upper, partial lower  ? ? ?Past Surgical History:  ?Procedure Laterality Date  ? AMPUTATION TOE Left 12/14/2017  ? Procedure: AMPUTATION TOE-4TH MPJ;  Surgeon: Gwyneth Revels, DPM;  Location: [facility];  Service: Podiatry;  Laterality: Left;  IVA LOCAL ?Diabetic - oral meds  ? APPENDECTOMY    ? CATARACT EXTRACTION W/PHACO Right 06/23/2015  ? Procedure: CATARACT EXTRACTION PHACO AND INTRAOCULAR LENS PLACEMENT (IOC);  Surgeon: Sallee Lange, MD;  Location: [facility];  Service: Ophthalmology;  Laterality: Right;  [place] 01:28 ?AP% 23.4 ?CDE 36.14 ?fluid pack lot # [id number]  ? CATARACT EXTRACTION W/PHACO Left 07/28/2015  ? Procedure: CATARACT EXTRACTION PHACO AND INTRAOCULAR LENS PLACEMENT (IOC);  Surgeon: Sallee Lange, MD;  Location: [facility];  Service: Ophthalmology;  Laterality: Left;  [place]    1:29.7 ?AP%  24.9 ?CDE   41.32 ?fluid casette lot #[id number] H exp 09/23/2016  ? COONOSCOPY    ? AND ENDOSCOPY  ? DILATION AND  CURETTAGE OF UTERUS    ? ENDARTERECTOMY Left 11/13/2020  ? Procedure: Evacuation of left neck hematoma;  Surgeon: Renford Dills, MD;  Location: [facility];  Service: Vascular;  Laterality: Left;  ? ENDARTERECTOMY Left 11/13/2020  ? Procedure: ENDARTERECTOMY CAROTID;  Surgeon: Annice Needy, MD;  Location: [facility];  Service: Vascular;  Laterality: Left;  ? TONSILLECTOMY    ? TUBAL LIGATION    ? ? ?There were no vitals filed for this visit. ? ? Subjective Assessment - 08/25/21 1305   ? ? Subjective  Pt. reports doing well today.   ? Patient is accompanied by: Family member   ? Pertinent History R ankle pain, gout, T2DM, HTN, CKDIII; R/L carotid endarectomy   ? Currently in Pain? No/denies   ? ?  ?  ? ?  ? ?OT Treatment ?  ?Therapeutic Ex: ?  ?Pt. worked on Marketing executive in the right hand for lateral, and 3pt. pinch using yellow, red, green, and blue resistive clips. Pt. worked on placing the clips on horizontal dowels at the tabletop. Tactile and verbal cues were required for eliciting the desired movement. Pt. Worked on accuracy with reaching for the targets.  ?  ?Neuromuscular re-education:  ?  ?Pt. worked on [facility] skills grasping 1" sticks, and 1/4" collars. Pt. worked on storing the objects in  the palm, and translatory skills moving the items from the palm of the hand to the tip of the 2nd digit, and thumb. Pt. worked on removing the pegs using bilateral alternating hand patterns.   ?  ?Pt. reports that her right hand is stiff today after not having therapy since last Thursday. Pt. dropped multiple 1" sticks initially, however after moist heat modality for 5 min. to the right hand, and performing pinch strengthening Pt. performed [facility] skills with more accuracy, and dropped less of the 1" sticks from her hand. Pt. continues to work on improving right hand function, and [facility] skills in order to work towards improving, and maximizing independence with ADLs,  and IADL tasks ?  ?  ?   ? ? ? ? ? ? ? ? ? ? ? ? ? ? ? ? ? ? ? ? ? OT Education - 08/25/21 1305   ? ? Education Details RUE functioning.   ? Person(s) Educated Patient   ? Methods Explanation;Verbal cues;Demonstration;Tactile cues   ? Comprehension Verbalized understanding;Verbal cues required;Returned demonstration   ? ?  ?  ? ?  ? ? ? OT Short Term Goals - 03/23/21 1657   ? ?  ? OT SHORT TERM GOAL #1  ? Title Pt will perform HEP for RUE strength/coordination independently.   ? Baseline Eval: HEP not yet established in outpatient, but pt is working with putty from [name]; [facility] this day with mod vc after return demo; 01/05/2021: indep with currently program, but ongoing as pt progresses; 01/28/2021: vc to revisit and focus on grip and pinch strengthening with putty; 03/23/21: pt is indep with HEP   ? Time 6   ? Period Weeks   ? Status Achieved   ? Target Date 03/23/21   ? ?  ?  ? ?  ? ? ? ? OT Long Term Goals - 07/30/21 1320   ? ?  ? OT LONG TERM GOAL #1  ? Title Pt will improve hand writing to 100% legibility with R hand to be able to independently write a check.   ? Baseline Eval: signature is 75% legible with R hand, requiring extra time; 12/04/2020: no chan to complete.ge from eval; 01/05/2021: Printing is 100% legible, signature is 90% legible, but still requires extra time and effort.; 01/26/21: Signature is 90% legible and speed/fluidity have improved. 20th visit: 90% legible, 03/23/21: 90% legible, extra time 40th visit 90 % legibility with increased time, 50th visit: 90% legible with with increased time required 07/14/2021: 90% legibility. 07/30/2021: 90% legibility, pt. presses too hard through the pen, and pencil.   ? Time 12   ? Period Weeks   ? Status On-going   ? Target Date 10/06/21   ?  ? OT LONG TERM GOAL #3  ? Title Pt will improve GMC throughout RUE to enable pt to reach for and pick up ADL supplies with R dominant hand without dropping or knocking over objects.   ? Baseline Eval: Pt reports RUE is clumsy  and easily knocks over objects when trying to reach for ADL supplies.  Pt verbalizes that her "aim is off."; 12/04/2020: pt reports slight improvement since eval and has started using her R hand to eat, but still feels clumsy; 01/05/2021: Greatly improved; pt is using silverware to eat with R hand, can comb hair with RUE, still mildly ataxic; 01/26/21: pt reports difficulty picking up objects within a small space (ie; may knock the toothpaste holder down when reaching  for toothbrush), but reaching for light/larger objects is improving 20th visit: pt. continues to be mildly ataxic, however is consistently improving with motor control, and FMC while reaching targets; 03/23/21: Pt reaches with accuracy towards targets if she takes her time (pt drops and knocks things over when moving at a faster/normal pace) 04/23/2021: Pt. continues to present with difficulty with depth perception, and impaired FMC.40th visit: Pt. is improving, however tends to knock over lighter weight objects.50th visit: Pt. has improved with reaching for items without knocking them over, however continues to need work on accuracy.07/14/2021: Pt. is improving while motor control, and accuracy of reaching for objects. Pt. continues to work towards improving efficiency with reaching for objects. 07/30/2021: Pt. is able to able to reach for objects more accurately, however continues to present with limited motor control in the Right.   ? Time 12   ? Period Weeks   ? Status On-going   ? Target Date 10/06/21   ?  ? OT LONG TERM GOAL #4  ? Title Pt will improve FOTO score to 68 or better to indicate measurable functional improvement.   ? Baseline Eval: FOTO 55; 01/05/2021: FOTO 61; 01/28/21: FOTO 61, 20th visit: 61; 03/23/21: FOTO 60 4oth visit: FOTO score: 61, 50th visit: FOTO 69 07/30/2021: FOTO 71   ? Time 12   ? Period Weeks   ? Status On-going   ? Target Date 10/06/21   ?  ? OT LONG TERM GOAL #6  ? Title Pt. will improve right hand [facility] skills to be able to  indepedendently manipulate and set-up her medication/pills in the pillbox.   ? Baseline 07/14/2021: Pt. has difficulty manipulating small pills, and often drops them. 07/30/2021: Pt. continues to have difficulty grasping pills/medication for s

## 2021-08-25 NOTE — Therapy (Signed)
[place] [facility] ?[place], [place], [address] ?Phone: [phone]   Fax:  [phone] ? ?Physical Therapy Treatment ? ?Patient Details  ?Name: Cassandra Schaefer ?MRN: [id number] ?Date of Birth: Oct 10, 1928 ?Referring Provider (PT): Dr. Dew/Dr. Ginette Pitman PCP ? ? ?Encounter Date: 08/25/2021 ? ? PT End of Session - 08/25/21 1354   ? ? Visit Number [id number]   ? Number of Visits 70   ? Date for PT Re-Evaluation 10/20/21   ? Authorization Type [employer]   ? PT Start Time 1348   ? PT Stop Time 1430   ? PT Time Calculation (min) 42 min   ? Equipment Utilized During Treatment Gait belt   ? Activity Tolerance Patient tolerated treatment well   ? Behavior During Therapy [facility] for tasks assessed/performed   ? ?  ?  ? ?  ? ? ?Past Medical History:  ?Diagnosis Date  ? Anemia   ? Cough   ? RESOLVING, NO FEVER  ? Diabetes mellitus without complication ([place])   ? Gout   ? Hypertension   ? Hypothyroidism   ? PUD (peptic ulcer disease)   ? Stroke [facility])   ? Wears dentures   ? full upper, partial lower  ? ? ?Past Surgical History:  ?Procedure Laterality Date  ? AMPUTATION TOE Left 12/14/2017  ? Procedure: AMPUTATION TOE-4TH MPJ;  Surgeon: Samara Deist, DPM;  Location: [place];  Service: Podiatry;  Laterality: Left;  IVA LOCAL ?Diabetic - oral meds  ? APPENDECTOMY    ? CATARACT EXTRACTION W/PHACO Right 06/23/2015  ? Procedure: CATARACT EXTRACTION PHACO AND INTRAOCULAR LENS PLACEMENT (IOC);  Surgeon: Estill Cotta, MD;  Location: [facility];  Service: Ophthalmology;  Laterality: Right;  [place] 01:28 ?AP% 23.4 ?CDE 36.14 ?fluid pack lot # [id number]  ? CATARACT EXTRACTION W/PHACO Left 07/28/2015  ? Procedure: CATARACT EXTRACTION PHACO AND INTRAOCULAR LENS PLACEMENT (IOC);  Surgeon: Estill Cotta, MD;  Location: [facility];  Service: Ophthalmology;  Laterality: Left;  [place]    1:29.7 ?AP%  24.9 ?CDE   41.32 ?fluid casette lot #[id number] H exp 09/23/2016  ? COONOSCOPY    ? AND ENDOSCOPY  ?  DILATION AND CURETTAGE OF UTERUS    ? ENDARTERECTOMY Left 11/13/2020  ? Procedure: Evacuation of left neck hematoma;  Surgeon: Katha Cabal, MD;  Location: [facility];  Service: Vascular;  Laterality: Left;  ? ENDARTERECTOMY Left 11/13/2020  ? Procedure: ENDARTERECTOMY CAROTID;  Surgeon: Algernon Huxley, MD;  Location: [facility];  Service: Vascular;  Laterality: Left;  ? TONSILLECTOMY    ? TUBAL LIGATION    ? ? ?There were no vitals filed for this visit. ? ? Subjective Assessment - 08/25/21 1353   ? ? Subjective Patient reports doing well. no new falls or stumbles. No pain; Reports being a little stiff from cool weather;   ? Patient is accompained by: --   Daughter, [name]  ? Pertinent History 88 y.o. female with medical history significant for type 2 diabetes mellitus, essential hypertension, acquired hypothyroidism, hyperlipidemia, stage III chronic kidney disease reports increased right sided weakness on 10/19/20. She was diagnosed with left basal ganglia ischemic stroke. She was discharged to inpatient rehab for 10 days and then discharged home. Patient was evaluated by outpatient PT on 11/12/20. CVA was due to carotid stenosis. She underwent left carotid endartectomy on 7/21. Procedure went well however patient suffered from left cervical hematoma and had subsequent surgical procedure to stop the bleed and remove the  hematoma on 11/13/20. They were concerned with her breathing and therefore intubated her for 1 day, she was in ICU for 2 days and transferred to med/surge on 11/15/20. Patient was discharged home on 11/17/20 and is now returning to outpatient PT. She has had the stiches removed and is healing well. Denies any neck pain or stiffness. She lives with her daughter and has 24/7 caregiver support. She is still using RW for most ambulation. She is able to transfer to bedside commode well and is able to stand short time unsupported. She reports feeling very weak and fatigued. She reports she has been dragging her  right foot more. She denies any numbness/tingling; She reports feeling like her legs are wanting to do more but is hesitant because of fear of falling. She is mod I for self care morning routine. She does still receive help with showering. She has been working on increasing her activity but denies any formal exercise.   ? Limitations Standing;Walking   ? How long can you sit comfortably? NA   ? How long can you stand comfortably? 30-60 min with support   ? How long can you walk comfortably? about 100 feet with RW, still limited to short distances;   ? Diagnostic tests MRI shows left basal ganglia infarct 10/21/20   ? Patient Stated Goals "Improve balance and walking and not need RW, get back to independent."   ? Currently in Pain? No/denies   ? Pain Onset In the past 7 days   ? ?  ?  ? ?  ? ? ? ? ?  ?TREATMENT: ?Warm up on Nustep , BUE/BLE level 2-4 interval training x4 min with cues to keep spm >70 to challenge cardiovascular conditioning; completed 0.16 miles total;  ?  ? NMR: ? ?Standing on 1/2 bolster (round side up): ?-BLE heel raises x15 reps ?-BLE toe raises x15 reps ?Required BUE rail assist for safety ?Required cues to increase ROM for better ankle ROM for improved ankle strategies;  ? ?Stepping over 1/2 bolster unsupported ?Forward/backward x10 reps with min A and increased instability when leading with RLE ?Side stepping over 1/2 bolster x10 reps with min A to CGA with increased ease of stepping; ?  ?Standing on airex: ?-feet close together, tapping ball to bottom step and then standing back up x10 reps unsupported, min A for safety;  ?-tandem stance with 0 rail assist holding ball in BUE: trunk rotation x5 reps each direction;  ?  ? 4 square stepping: ?Clockwise/counterclockwise x4 laps each ?Multi-directional stepping x2-3 min with CGA for safety, exhibits decreased step length and unsteadiness when stepping backward;  ?  ?Instructed patient in gait crossovers: ?X10 feet x2 laps each direction with BUE  rail assist; Required mod VCs for sequencing and positioning;  ?  ? ?Gait around gym x80 feet  x1 sets with CGA, no assistive device, reciprocal gait pattern, does exhibit better step length and improved smoothness of gait following ladder drills. She does require CGA for safety with occasional misstep;  ?   ?Pt educated throughout session about proper posture and technique with exercises. Improved exercise technique, movement at target joints, use of target muscles after min to mod verbal, visual, tactile cues. ?  ?Patient tolerated session well. She does report mild fatigue at end of session.  ?  ?  ? Goals updated 08/06/21 ?  ? ? ? ? ? ? ? ? ? ? ? ? ? ? ? ? ? ? ? ? ? ? ? ?  PT Education - 08/25/21 1354   ? ? Education Details exercise technique/positioning;   ? Person(s) Educated Patient   ? Methods Explanation;Verbal cues   ? Comprehension Returned demonstration;Verbalized understanding;Verbal cues required;Need further instruction   ? ?  ?  ? ?  ? ? ? PT Short Term Goals - 06/25/21 1151   ? ?  ? PT SHORT TERM GOAL #1  ? Title Patient will be adherent to HEP at least 3x a week to improve functional strength and balance for better safety at home.   ? Baseline 9/21: Pt reports compliance with HEP and is confident, 1/3: doing exercise 2x a week; 06/25/21: 3x/week   ? Time 4   ? Period Weeks   ? Status Achieved   ? Target Date 06/25/21   ?  ? PT SHORT TERM GOAL #2  ? Title Patient will be independent in transferring sit<>Stand without pushing on arm rests to improve ability to get up from chair.   ? Baseline 9/21: pt able to complete STS without use of UEs, 1/3:able to stand but has moderate difficulty requiring increased time; 06/25/21: able to perform indep without hands but requiring use of momentum to attain standing. Remains stable.   ? Time 4   ? Period Weeks   ? Status Partially Met   ? Target Date 07/23/21   ? ?  ?  ? ?  ? ? ? ? PT Long Term Goals - 08/25/21 1356   ? ?  ? PT LONG TERM GOAL #1  ? Title Patient (>  86 years old) will complete five times sit to stand test in < 15 seconds indicating an increased LE strength and improved balance.   ? Baseline 9/21: 29 seconds hands-free 10/10: deferred 10/12: 34 sec

## 2021-08-27 ENCOUNTER — Encounter: Payer: Self-pay | Admitting: Physical Therapy

## 2021-08-27 ENCOUNTER — Ambulatory Visit: Payer: Medicare HMO | Admitting: Occupational Therapy

## 2021-08-27 ENCOUNTER — Ambulatory Visit: Payer: Medicare HMO | Admitting: Physical Therapy

## 2021-08-27 ENCOUNTER — Encounter: Payer: Self-pay | Admitting: Occupational Therapy

## 2021-08-27 DIAGNOSIS — M6281 Muscle weakness (generalized): Secondary | ICD-10-CM

## 2021-08-27 DIAGNOSIS — R278 Other lack of coordination: Secondary | ICD-10-CM

## 2021-08-27 DIAGNOSIS — R2681 Unsteadiness on feet: Secondary | ICD-10-CM

## 2021-08-27 DIAGNOSIS — R2689 Other abnormalities of gait and mobility: Secondary | ICD-10-CM

## 2021-08-27 DIAGNOSIS — R269 Unspecified abnormalities of gait and mobility: Secondary | ICD-10-CM

## 2021-08-27 DIAGNOSIS — R482 Apraxia: Secondary | ICD-10-CM

## 2021-08-27 DIAGNOSIS — R262 Difficulty in walking, not elsewhere classified: Secondary | ICD-10-CM

## 2021-08-27 NOTE — Therapy (Signed)
[place] ?[facility] ?[address] ?[place], [place], [address] ?Phone: [phone]   Fax:  [phone] ? ?Occupational Therapy Treatment ? ?Patient Details  ?Name: Cassandra Schaefer ?MRN: [id number] ?Date of Birth: [birth year] ?Referring Provider (OT): Dr. Barbette Reichmann ? ? ?Encounter Date: 08/27/2021 ? ? OT End of Session - 08/27/21 1310   ? ? Visit Number 68   ? Number of Visits 90   ? Date for OT Re-Evaluation 10/06/21   ? Authorization Time Period Reporting period starting 06/18/2020   ? OT Start Time 1300   ? OT Stop Time 1345   ? OT Time Calculation (min) 45 min   ? Equipment Utilized During Treatment tranport chair   ? Activity Tolerance Patient tolerated treatment well   ? Behavior During Therapy [facility] for tasks assessed/performed   ? ?  ?  ? ?  ? ? ?Past Medical History:  ?Diagnosis Date  ? Anemia   ? Cough   ? RESOLVING, NO FEVER  ? Diabetes mellitus without complication ([facility])   ? Gout   ? Hypertension   ? Hypothyroidism   ? PUD (peptic ulcer disease)   ? Stroke [facility])   ? Wears dentures   ? full upper, partial lower  ? ? ?Past Surgical History:  ?Procedure Laterality Date  ? AMPUTATION TOE Left 12/14/2017  ? Procedure: AMPUTATION TOE-4TH MPJ;  Surgeon: Gwyneth Revels, DPM;  Location: [facility];  Service: Podiatry;  Laterality: Left;  IVA LOCAL ?Diabetic - oral meds  ? APPENDECTOMY    ? CATARACT EXTRACTION W/PHACO Right 06/23/2015  ? Procedure: CATARACT EXTRACTION PHACO AND INTRAOCULAR LENS PLACEMENT (IOC);  Surgeon: Sallee Lange, MD;  Location: [facility];  Service: Ophthalmology;  Laterality: Right;  [place] 01:28 ?AP% 23.4 ?CDE 36.14 ?fluid pack lot # [id number]  ? CATARACT EXTRACTION W/PHACO Left 07/28/2015  ? Procedure: CATARACT EXTRACTION PHACO AND INTRAOCULAR LENS PLACEMENT (IOC);  Surgeon: Sallee Lange, MD;  Location: [facility];  Service: Ophthalmology;  Laterality: Left;  [place]    1:29.7 ?AP%  24.9 ?CDE   41.32 ?fluid casette lot #[id number] H exp 09/23/2016   ? COONOSCOPY    ? AND ENDOSCOPY  ? DILATION AND CURETTAGE OF UTERUS    ? ENDARTERECTOMY Left 11/13/2020  ? Procedure: Evacuation of left neck hematoma;  Surgeon: Renford Dills, MD;  Location: [facility];  Service: Vascular;  Laterality: Left;  ? ENDARTERECTOMY Left 11/13/2020  ? Procedure: ENDARTERECTOMY CAROTID;  Surgeon: Annice Needy, MD;  Location: [facility];  Service: Vascular;  Laterality: Left;  ? TONSILLECTOMY    ? TUBAL LIGATION    ? ? ?There were no vitals filed for this visit. ? ? Subjective Assessment - 08/27/21 1309   ? ? Subjective  Pt. reports doing well today.   ? Patient is accompanied by: Family member   ? Pertinent History R ankle pain, gout, T2DM, HTN, CKDIII; R/L carotid endarectomy   ? Currently in Pain? No/denies   ? ?  ?  ? ?  ? ?OT Treatment ?  ?Neuromuscular re-education:  ?  ?Pt. worked on right hand [facility] skills grasping 1" sticks, and 1/4" collars. Pt. worked on storing the objects in the palm, and translatory skills moving the items from the palm of the hand to the tip of the 2nd digit, and thumb. Pt. worked on removing the pegs using bilateral alternating hand patterns.   ?  ?Pt. presents with less stiffness today. Pt. performed [facility] skills with more  accuracy, and dropped less of the 1" sticks from her hand. Pt. was able to manipulate small objects on the [employer], however dropped a few of the small objects from the right hand. Pt. required cues for redirection when further challenged by dual tasking with conservation while performing [facility] skills. Pt. continues to work on improving right hand function, and [facility] skills in order to work towards improving, and maximizing independence with ADLs, and IADL tasks ?  ?  ?  ?  ?  ? ? ? ? ? ? ? ? ? ? ? ? ? ? ? ? ? ? ? ? ? OT Education - 08/27/21 1309   ? ? Education Details RUE functioning.   ? Person(s) Educated Patient   ? Methods Explanation;Verbal cues;Demonstration;Tactile cues   ? Comprehension Verbalized understanding;Verbal cues  required;Returned demonstration   ? ?  ?  ? ?  ? ? ? OT Short Term Goals - 03/23/21 1657   ? ?  ? OT SHORT TERM GOAL #1  ? Title Pt will perform HEP for RUE strength/coordination independently.   ? Baseline Eval: HEP not yet established in outpatient, but pt is working with putty from [name]; 12/04/2020; [facility] HEP initiated this day with mod vc after return demo; 01/05/2021: indep with currently program, but ongoing as pt progresses; 01/28/2021: vc to revisit and focus on grip and pinch strengthening with putty; 03/23/21: pt is indep with HEP   ? Time 6   ? Period Weeks   ? Status Achieved   ? Target Date 03/23/21   ? ?  ?  ? ?  ? ? ? ? OT Long Term Goals - 07/30/21 1320   ? ?  ? OT LONG TERM GOAL #1  ? Title Pt will improve hand writing to 100% legibility with R hand to be able to independently write a check.   ? Baseline Eval: signature is 75% legible with R hand, requiring extra time; 12/04/2020: no chan to complete.ge from eval; 01/05/2021: Printing is 100% legible, signature is 90% legible, but still requires extra time and effort.; 01/26/21: Signature is 90% legible and speed/fluidity have improved. 20th visit: 90% legible, 03/23/21: 90% legible, extra time 40th visit 90 % legibility with increased time, 50th visit: 90% legible with with increased time required 07/14/2021: 90% legibility. 07/30/2021: 90% legibility, pt. presses too hard through the pen, and pencil.   ? Time 12   ? Period Weeks   ? Status On-going   ? Target Date 10/06/21   ?  ? OT LONG TERM GOAL #3  ? Title Pt will improve GMC throughout RUE to enable pt to reach for and pick up ADL supplies with R dominant hand without dropping or knocking over objects.   ? Baseline Eval: Pt reports RUE is clumsy and easily knocks over objects when trying to reach for ADL supplies.  Pt verbalizes that her "aim is off."; 12/04/2020: pt reports slight improvement since eval and has started using her R hand to eat, but still feels clumsy; 01/05/2021: Greatly improved; pt is  using silverware to eat with R hand, can comb hair with RUE, still mildly ataxic; 01/26/21: pt reports difficulty picking up objects within a small space (ie; may knock the toothpaste holder down when reaching for toothbrush), but reaching for light/larger objects is improving 20th visit: pt. continues to be mildly ataxic, however is consistently improving with motor control, and FMC while reaching targets; 03/23/21: Pt reaches with accuracy towards targets if she takes  her time (pt drops and knocks things over when moving at a faster/normal pace) 04/23/2021: Pt. continues to present with difficulty with depth perception, and impaired FMC.40th visit: Pt. is improving, however tends to knock over lighter weight objects.50th visit: Pt. has improved with reaching for items without knocking them over, however continues to need work on accuracy.07/14/2021: Pt. is improving while motor control, and accuracy of reaching for objects. Pt. continues to work towards improving efficiency with reaching for objects. 07/30/2021: Pt. is able to able to reach for objects more accurately, however continues to present with limited motor control in the Right.   ? Time 12   ? Period Weeks   ? Status On-going   ? Target Date 10/06/21   ?  ? OT LONG TERM GOAL #4  ? Title Pt will improve FOTO score to 68 or better to indicate measurable functional improvement.   ? Baseline Eval: FOTO 55; 01/05/2021: FOTO 61; 01/28/21: FOTO 61, 20th visit: 61; 03/23/21: FOTO 60 4oth visit: FOTO score: 61, 50th visit: FOTO 69 07/30/2021: FOTO 71   ? Time 12   ? Period Weeks   ? Status On-going   ? Target Date 10/06/21   ?  ? OT LONG TERM GOAL #6  ? Title Pt. will improve right hand [facility] skills to be able to indepedendently manipulate and set-up her medication/pills in the pillbox.   ? Baseline 07/14/2021: Pt. has difficulty manipulating small pills, and often drops them. 07/30/2021: Pt. continues to have difficulty grasping pills/medication for set-up of pillbox.   ?  Time 12   ? Period Weeks   ? Status New   ? Target Date 10/06/21   ? ?  ?  ? ?  ? ? ? ? ? ? ? ? Plan - 08/27/21 1310   ? ? Clinical Impression Statement Pt. presents with less stiffness today. Pt. performed

## 2021-08-27 NOTE — Therapy (Signed)
[place] [place] [facility] ?[place], [place], [address] ?Phone: [phone]   Fax:  [phone] ? ?Physical Therapy Treatment ? ?Patient Details  ?Name: Cassandra Schaefer ?MRN: [id number] ?Date of Birth: April 22, 1929 ?Referring Provider (PT): Dr. Dew/Dr. Ginette Pitman PCP ? ? ?Encounter Date: 08/27/2021 ? ? PT End of Session - 08/27/21 1350   ? ? Visit Number 28   ? Number of Visits 70   ? Date for PT Re-Evaluation 10/20/21   ? Authorization Type [employer]   ? PT Start Time 1348   ? PT Stop Time 1430   ? PT Time Calculation (min) 42 min   ? Equipment Utilized During Treatment Gait belt   ? Activity Tolerance Patient tolerated treatment well   ? Behavior During Therapy [facility] for tasks assessed/performed   ? ?  ?  ? ?  ? ? ?Past Medical History:  ?Diagnosis Date  ? Anemia   ? Cough   ? RESOLVING, NO FEVER  ? Diabetes mellitus without complication ([place])   ? Gout   ? Hypertension   ? Hypothyroidism   ? PUD (peptic ulcer disease)   ? Stroke [facility])   ? Wears dentures   ? full upper, partial lower  ? ? ?Past Surgical History:  ?Procedure Laterality Date  ? AMPUTATION TOE Left 12/14/2017  ? Procedure: AMPUTATION TOE-4TH MPJ;  Surgeon: Samara Deist, DPM;  Location: [place];  Service: Podiatry;  Laterality: Left;  IVA LOCAL ?Diabetic - oral meds  ? APPENDECTOMY    ? CATARACT EXTRACTION W/PHACO Right 06/23/2015  ? Procedure: CATARACT EXTRACTION PHACO AND INTRAOCULAR LENS PLACEMENT (IOC);  Surgeon: Estill Cotta, MD;  Location: [facility];  Service: Ophthalmology;  Laterality: Right;  [place] 01:28 ?AP% 23.4 ?CDE 36.14 ?fluid pack lot # [id number]  ? CATARACT EXTRACTION W/PHACO Left 07/28/2015  ? Procedure: CATARACT EXTRACTION PHACO AND INTRAOCULAR LENS PLACEMENT (IOC);  Surgeon: Estill Cotta, MD;  Location: [facility];  Service: Ophthalmology;  Laterality: Left;  [place]    1:29.7 ?AP%  24.9 ?CDE   41.32 ?fluid casette lot #[id number] H exp 09/23/2016  ? COONOSCOPY    ? AND ENDOSCOPY  ?  DILATION AND CURETTAGE OF UTERUS    ? ENDARTERECTOMY Left 11/13/2020  ? Procedure: Evacuation of left neck hematoma;  Surgeon: Katha Cabal, MD;  Location: [facility];  Service: Vascular;  Laterality: Left;  ? ENDARTERECTOMY Left 11/13/2020  ? Procedure: ENDARTERECTOMY CAROTID;  Surgeon: Algernon Huxley, MD;  Location: [facility];  Service: Vascular;  Laterality: Left;  ? TONSILLECTOMY    ? TUBAL LIGATION    ? ? ?There were no vitals filed for this visit. ? ? Subjective Assessment - 08/27/21 1350   ? ? Subjective Patient reports doing well. No new falls or complaints.   ? Patient is accompained by: --   Daughter, Cassandra Schaefer  ? Pertinent History 88 y.o. female with medical history significant for type 2 diabetes mellitus, essential hypertension, acquired hypothyroidism, hyperlipidemia, stage III chronic kidney disease reports increased right sided weakness on 10/19/20. She was diagnosed with left basal ganglia ischemic stroke. She was discharged to inpatient rehab for 10 days and then discharged home. Patient was evaluated by outpatient PT on 11/12/20. CVA was due to carotid stenosis. She underwent left carotid endartectomy on 7/21. Procedure went well however patient suffered from left cervical hematoma and had subsequent surgical procedure to stop the bleed and remove the hematoma on 11/13/20. They were concerned with her breathing and  therefore intubated her for 1 day, she was in ICU for 2 days and transferred to med/surge on 11/15/20. Patient was discharged home on 11/17/20 and is now returning to outpatient PT. She has had the stiches removed and is healing well. Denies any neck pain or stiffness. She lives with her daughter and has 24/7 caregiver support. She is still using RW for most ambulation. She is able to transfer to bedside commode well and is able to stand short time unsupported. She reports feeling very weak and fatigued. She reports she has been dragging her right foot more. She denies any numbness/tingling; She  reports feeling like her legs are wanting to do more but is hesitant because of fear of falling. She is mod I for self care morning routine. She does still receive help with showering. She has been working on increasing her activity but denies any formal exercise.   ? Limitations Standing;Walking   ? How long can you sit comfortably? NA   ? How long can you stand comfortably? 30-60 min with support   ? How long can you walk comfortably? about 100 feet with RW, still limited to short distances;   ? Diagnostic tests MRI shows left basal ganglia infarct 10/21/20   ? Patient Stated Goals "Improve balance and walking and not need RW, get back to independent."   ? Currently in Pain? No/denies   ? Pain Onset In the past 7 days   ? ?  ?  ? ?  ? ? ? ? ? ? ?  ?TREATMENT: ?Warm up on Nustep , BUE/BLE level 2-4 interval training x4 min with cues to keep spm >70 to challenge cardiovascular conditioning; completed 0.14 miles total;  ?Spo2 98%, HR 111 following activity; Reports moderate fatigue;  ?  ? NMR: ? ?Standing on 1/2 bolster (round side up): ?-BLE heel raises x15 reps ?-BLE toe raises x15 reps ?-Feet in neutral BLE apart, mini squat x10 reps ?Required BUE rail assist for safety ?Required cues to increase ROM for better ankle ROM for improved ankle strategies;  ?  ?Stepping airex to airex with 1-0 rail assist ?Forward/backward x10 reps with min A and increased instability when leading with RLE ?Side stepping  x10 reps with CGA with increased ease of stepping; ?  ?Standing on airex: ?-feet close together, tapping ball to bottom step and then standing back up x10 reps unsupported, min A for safety;  ?-tandem stance with 0 rail assist holding ball in BUE: trunk rotation x5 reps each direction;  ? ?Standing in mid stance phase: ?Forward/backward weight shift x15 reps ? Progressed to alternate UE arm raise x5-10 more reps ?Required mod VCs for proper positioning/sequencing. ?Pt reports increased fatigue and soreness in LE, she  was able to exhibit better coordination with UE with increased practice;  ?  ? Gait around gym x50 feet  x1 sets with CGA, no assistive device, reciprocal gait pattern, does exhibit better step length and improved smoothness of gait following ladder drills. She does require CGA for safety with occasional misstep;  ? ?Standing on incline board unsupported ?30 sec hold x2 reps- initial rep patient has difficulty keeping forward weight shift with heavy posterior loss of balance requiring min A for recovery; ?During 2nd set she was able to recover better with improved weight shift and erect posture, CGA to close supervision;  ?   ?Pt educated throughout session about proper posture and technique with exercises. Improved exercise technique, movement at target joints, use of target muscles  after min to mod verbal, visual, tactile cues. ?  ?Patient tolerated session well. She does report mild fatigue at end of session.  ?  ?  ?  ?  ?  ? ? ? ? ? ? ? ? ? ? ? ? ? ? ? ? ? ? ? ? ? ? PT Education - 08/27/21 1350   ? ? Education Details exercise technique/positioning   ? Person(s) Educated Patient   ? Methods Explanation;Verbal cues   ? Comprehension Verbalized understanding;Returned demonstration;Verbal cues required;Need further instruction   ? ?  ?  ? ?  ? ? ? PT Short Term Goals - 06/25/21 1151   ? ?  ? PT SHORT TERM GOAL #1  ? Title Patient will be adherent to HEP at least 3x a week to improve functional strength and balance for better safety at home.   ? Baseline 9/21: Pt reports compliance with HEP and is confident, 1/3: doing exercise 2x a week; 06/25/21: 3x/week   ? Time 4   ? Period Weeks   ? Status Achieved   ? Target Date 06/25/21   ?  ? PT SHORT TERM GOAL #2  ? Title Patient will be independent in transferring sit<>Stand without pushing on arm rests to improve ability to get up from chair.   ? Baseline 9/21: pt able to complete STS without use of UEs, 1/3:able to stand but has moderate difficulty requiring increased  time; 06/25/21: able to perform indep without hands but requiring use of momentum to attain standing. Remains stable.   ? Time 4   ? Period Weeks   ? Status Partially Met   ? Target Date 07/23/21   ? ?  ?

## 2021-09-01 ENCOUNTER — Ambulatory Visit: Payer: Medicare HMO | Admitting: Physical Therapy

## 2021-09-01 ENCOUNTER — Ambulatory Visit: Payer: Medicare HMO | Admitting: Occupational Therapy

## 2021-09-01 ENCOUNTER — Encounter: Payer: Self-pay | Admitting: Physical Therapy

## 2021-09-01 DIAGNOSIS — R482 Apraxia: Secondary | ICD-10-CM

## 2021-09-01 DIAGNOSIS — M6281 Muscle weakness (generalized): Secondary | ICD-10-CM

## 2021-09-01 DIAGNOSIS — R2689 Other abnormalities of gait and mobility: Secondary | ICD-10-CM

## 2021-09-01 DIAGNOSIS — R269 Unspecified abnormalities of gait and mobility: Secondary | ICD-10-CM

## 2021-09-01 DIAGNOSIS — R278 Other lack of coordination: Secondary | ICD-10-CM

## 2021-09-01 DIAGNOSIS — R262 Difficulty in walking, not elsewhere classified: Secondary | ICD-10-CM

## 2021-09-01 DIAGNOSIS — R2681 Unsteadiness on feet: Secondary | ICD-10-CM

## 2021-09-01 NOTE — Therapy (Signed)
[place] ?[facility] ?[place], [place], [address] ?Phone: [phone]   Fax:  [phone] ? ?Occupational Therapy Treatment ? ?Patient Details  ?Name: Cassandra Schaefer ?MRN: [id number] ?Date of Birth: 1928/11/26 ?Referring Provider (OT): Dr. Tracie Harrier ? ? ?Encounter Date: 09/01/2021 ? ? OT End of Session - 09/01/21 1324   ? ? Visit Number 41   ? Number of Visits 90   ? Date for OT Re-Evaluation 10/06/21   ? Authorization Time Period Reporting period starting 06/18/2020   ? OT Start Time 1307   ? OT Stop Time 1345   ? OT Time Calculation (min) 38 min   ? Activity Tolerance Patient tolerated treatment well   ? Behavior During Therapy [facility] for tasks assessed/performed   ? ?  ?  ? ?  ? ? ?Past Medical History:  ?Diagnosis Date  ? Anemia   ? Cough   ? RESOLVING, NO FEVER  ? Diabetes mellitus without complication ([place])   ? Gout   ? Hypertension   ? Hypothyroidism   ? PUD (peptic ulcer disease)   ? Stroke [facility])   ? Wears dentures   ? full upper, partial lower  ? ? ?Past Surgical History:  ?Procedure Laterality Date  ? AMPUTATION TOE Left 12/14/2017  ? Procedure: AMPUTATION TOE-4TH MPJ;  Surgeon: Samara Deist, DPM;  Location: [place];  Service: Podiatry;  Laterality: Left;  IVA LOCAL ?Diabetic - oral meds  ? APPENDECTOMY    ? CATARACT EXTRACTION W/PHACO Right 06/23/2015  ? Procedure: CATARACT EXTRACTION PHACO AND INTRAOCULAR LENS PLACEMENT (IOC);  Surgeon: Estill Cotta, MD;  Location: [facility];  Service: Ophthalmology;  Laterality: Right;  [place] 01:28 ?AP% 23.4 ?CDE 36.14 ?fluid pack lot # [id number]  ? CATARACT EXTRACTION W/PHACO Left 07/28/2015  ? Procedure: CATARACT EXTRACTION PHACO AND INTRAOCULAR LENS PLACEMENT (IOC);  Surgeon: Estill Cotta, MD;  Location: [facility];  Service: Ophthalmology;  Laterality: Left;  [place]    1:29.7 ?AP%  24.9 ?CDE   41.32 ?fluid casette lot [id number] H exp 09/23/2016  ? COONOSCOPY    ? AND ENDOSCOPY  ? DILATION AND  CURETTAGE OF UTERUS    ? ENDARTERECTOMY Left 11/13/2020  ? Procedure: Evacuation of left neck hematoma;  Surgeon: Katha Cabal, MD;  Location: [facility];  Service: Vascular;  Laterality: Left;  ? ENDARTERECTOMY Left 11/13/2020  ? Procedure: ENDARTERECTOMY CAROTID;  Surgeon: Algernon Huxley, MD;  Location: [facility];  Service: Vascular;  Laterality: Left;  ? TONSILLECTOMY    ? TUBAL LIGATION    ? ? ?There were no vitals filed for this visit. ? ? Subjective Assessment - 09/01/21 1323   ? ? Subjective  Pt. reports doing well today.   ? Patient is accompanied by: Family member   ? Pertinent History R ankle pain, gout, T2DM, HTN, CKDIII; R/L carotid endarectomy   ? Currently in Pain? No/denies   ? ?  ?  ? ?  ? ?OT TREATMENT   ? ?Neuro muscular re-education: ? ?Pt. worked on grasping one inch resistive cubes alternating thumb opposition to the tip of the 2nd digit. The board was positioned at a vertical angle. Pt. worked on pressing them back into place while isolating 2nd through 5th digits. Pt. performed [facility] skills grasping 1/8" cubes with her right hand, and stacking the cubes. Pt. worked on translatory movements, moving them into her palm, then from her palm to the tip of her 2nd digit and thumb in  preparation for stacking them on the tabletop.  ? ?Pt. is making progress with RUE functioning, and the accuracy of reaching precise targets. Pt. Reports that she is now accurately able to manipulate, and hold her pills when setting up her pillbox this week. Pt. Was able to stack a vertical series of  6-1/8" cubes. Pt. Continues to have difficulty with performing translatroy movements. Pt. Continues to present with weakness, and decreased [facility] skills in order to improve UE functioning during ADL, and IADL skills. ?  ? ? ? ? ? ? ? ? ? ? ? ? ? ? ? ? ? ? ? ? ? OT Education - 09/01/21 1324   ? ? Education Details RUE functioning.   ? Person(s) Educated Patient   ? Methods Explanation;Verbal cues;Demonstration;Tactile cues   ?  Comprehension Verbalized understanding;Verbal cues required;Returned demonstration   ? ?  ?  ? ?  ? ? ? OT Short Term Goals - 03/23/21 1657   ? ?  ? OT SHORT TERM GOAL #1  ? Title Pt will perform HEP for RUE strength/coordination independently.   ? Baseline Eval: HEP not yet established in outpatient, but pt is working with putty from [name]; 12/04/2020; [facility] HEP initiated this day with mod vc after return demo; 01/05/2021: indep with currently program, but ongoing as pt progresses; 01/28/2021: vc to revisit and focus on grip and pinch strengthening with putty; 03/23/21: pt is indep with HEP   ? Time 6   ? Period Weeks   ? Status Achieved   ? Target Date 03/23/21   ? ?  ?  ? ?  ? ? ? ? OT Long Term Goals - 07/30/21 1320   ? ?  ? OT LONG TERM GOAL #1  ? Title Pt will improve hand writing to 100% legibility with R hand to be able to independently write a check.   ? Baseline Eval: signature is 75% legible with R hand, requiring extra time; 12/04/2020: no chan to complete.ge from eval; 01/05/2021: Printing is 100% legible, signature is 90% legible, but still requires extra time and effort.; 01/26/21: Signature is 90% legible and speed/fluidity have improved. 20th visit: 90% legible, 03/23/21: 90% legible, extra time 40th visit 90 % legibility with increased time, 50th visit: 90% legible with with increased time required 07/14/2021: 90% legibility. 07/30/2021: 90% legibility, pt. presses too hard through the pen, and pencil.   ? Time 12   ? Period Weeks   ? Status On-going   ? Target Date 10/06/21   ?  ? OT LONG TERM GOAL #3  ? Title Pt will improve [place] throughout RUE to enable pt to reach for and pick up ADL supplies with R dominant hand without dropping or knocking over objects.   ? Baseline Eval: Pt reports RUE is clumsy and easily knocks over objects when trying to reach for ADL supplies.  Pt verbalizes that her "aim is off."; 12/04/2020: pt reports slight improvement since eval and has started using her R hand to eat, but still  feels clumsy; 01/05/2021: Greatly improved; pt is using silverware to eat with R hand, can comb hair with RUE, still mildly ataxic; 01/26/21: pt reports difficulty picking up objects within a small space (ie; may knock the toothpaste holder down when reaching for toothbrush), but reaching for light/larger objects is improving 20th visit: pt. continues to be mildly ataxic, however is consistently improving with motor control, and [place] while reaching targets; 03/23/21: Pt reaches with accuracy towards targets if she  takes her time (pt drops and knocks things over when moving at a faster/normal pace) 04/23/2021: Pt. continues to present with difficulty with depth perception, and impaired [place].40th visit: Pt. is improving, however tends to knock over lighter weight objects.50th visit: Pt. has improved with reaching for items without knocking them over, however continues to need work on accuracy.07/14/2021: Pt. is improving while motor control, and accuracy of reaching for objects. Pt. continues to work towards improving efficiency with reaching for objects. 07/30/2021: Pt. is able to able to reach for objects more accurately, however continues to present with limited motor control in the Right.   ? Time 12   ? Period Weeks   ? Status On-going   ? Target Date 10/06/21   ?  ? OT LONG TERM GOAL #4  ? Title Pt will improve FOTO score to 68 or better to indicate measurable functional improvement.   ? Baseline Eval: FOTO 55; 01/05/2021: FOTO 61; 01/28/21: FOTO 61, 20th visit: 61; 03/23/21: FOTO 60 4oth visit: FOTO score: 61, 50th visit: FOTO 69 07/30/2021: FOTO 71   ? Time 12   ? Period Weeks   ? Status On-going   ? Target Date 10/06/21   ?  ? OT LONG TERM GOAL #6  ? Title Pt. will improve right hand [facility] skills to be able to indepedendently manipulate and set-up her medication/pills in the pillbox.   ? Baseline 07/14/2021: Pt. has difficulty manipulating small pills, and often drops them. 07/30/2021: Pt. continues to have difficulty  grasping pills/medication for set-up of pillbox.   ? Time 12   ? Period Weeks   ? Status New   ? Target Date 10/06/21   ? ?  ?  ? ?  ? ? ? ? ? ? ? ? Plan - 09/01/21 1324   ? ? Clinical Impression Statement Pt. i

## 2021-09-01 NOTE — Therapy (Signed)
[place] ?[place], [place], [address] ?Phone: [phone]   Fax:  [phone] ? ?Physical Therapy Treatment ? ?Patient Details  ?Name: Cassandra Schaefer ?MRN: [id number] ?Date of Birth: 23-Jun-1928 ?Referring Provider (PT): Dr. Dew/Dr. Ginette Pitman PCP ? ? ?Encounter Date: 09/01/2021 ? ? PT End of Session - 09/01/21 1354   ? ? Visit Number 11   ? Number of Visits 86   ? Date for PT Re-Evaluation 10/20/21   ? Authorization Type [employer] Medicare   ? PT Start Time 1348   ? PT Stop Time 1430   ? PT Time Calculation (min) 42 min   ? Equipment Utilized During Treatment Gait belt   ? Activity Tolerance Patient tolerated treatment well   ? Behavior During Therapy [facility] for tasks assessed/performed   ? ?  ?  ? ?  ? ? ?Past Medical History:  ?Diagnosis Date  ? Anemia   ? Cough   ? RESOLVING, NO FEVER  ? Diabetes mellitus without complication ([place])   ? Gout   ? Hypertension   ? Hypothyroidism   ? PUD (peptic ulcer disease)   ? Stroke [facility])   ? Wears dentures   ? full upper, partial lower  ? ? ?Past Surgical History:  ?Procedure Laterality Date  ? AMPUTATION TOE Left 12/14/2017  ? Procedure: AMPUTATION TOE-4TH MPJ;  Surgeon: Samara Deist, DPM;  Location: [place];  Service: Podiatry;  Laterality: Left;  IVA LOCAL ?Diabetic - oral meds  ? APPENDECTOMY    ? CATARACT EXTRACTION W/PHACO Right 06/23/2015  ? Procedure: CATARACT EXTRACTION PHACO AND INTRAOCULAR LENS PLACEMENT (IOC);  Surgeon: Estill Cotta, MD;  Location: [facility];  Service: Ophthalmology;  Laterality: Right;  [place] 01:28 ?AP% 23.4 ?CDE 36.14 ?fluid pack lot # [id number]  ? CATARACT EXTRACTION W/PHACO Left 07/28/2015  ? Procedure: CATARACT EXTRACTION PHACO AND INTRAOCULAR LENS PLACEMENT (IOC);  Surgeon: Estill Cotta, MD;  Location: [facility];  Service: Ophthalmology;  Laterality: Left;  [place]    1:29.7 ?AP%  24.9 ?CDE   41.32 ?fluid casette lot #[id number] H exp 09/23/2016  ? COONOSCOPY    ? AND ENDOSCOPY  ?  DILATION AND CURETTAGE OF UTERUS    ? ENDARTERECTOMY Left 11/13/2020  ? Procedure: Evacuation of left neck hematoma;  Surgeon: Katha Cabal, MD;  Location: [facility];  Service: Vascular;  Laterality: Left;  ? ENDARTERECTOMY Left 11/13/2020  ? Procedure: ENDARTERECTOMY CAROTID;  Surgeon: Algernon Huxley, MD;  Location: [facility];  Service: Vascular;  Laterality: Left;  ? TONSILLECTOMY    ? TUBAL LIGATION    ? ? ?There were no vitals filed for this visit. ? ? Subjective Assessment - 09/01/21 1354   ? ? Subjective Patient reports doing well. No new falls or complaints. She was able to go shopping last weekend with good tolerance. She does report increased fatigue after walking all over the store.   ? Patient is accompained by: --   Daughter, [name]  ? Pertinent History 88 y.o. female with medical history significant for type 2 diabetes mellitus, essential hypertension, acquired hypothyroidism, hyperlipidemia, stage III chronic kidney disease reports increased right sided weakness on 10/19/20. She was diagnosed with left basal ganglia ischemic stroke. She was discharged to inpatient rehab for 10 days and then discharged home. Patient was evaluated by outpatient PT on 11/12/20. CVA was due to carotid stenosis. She underwent left carotid endartectomy on 7/21. Procedure went well however patient suffered from left cervical hematoma  and had subsequent surgical procedure to stop the bleed and remove the hematoma on 11/13/20. They were concerned with her breathing and therefore intubated her for 1 day, she was in ICU for 2 days and transferred to med/surge on 11/15/20. Patient was discharged home on 11/17/20 and is now returning to outpatient PT. She has had the stiches removed and is healing well. Denies any neck pain or stiffness. She lives with her daughter and has 24/7 caregiver support. She is still using RW for most ambulation. She is able to transfer to bedside commode well and is able to stand short time unsupported. She  reports feeling very weak and fatigued. She reports she has been dragging her right foot more. She denies any numbness/tingling; She reports feeling like her legs are wanting to do more but is hesitant because of fear of falling. She is mod I for self care morning routine. She does still receive help with showering. She has been working on increasing her activity but denies any formal exercise.   ? Limitations Standing;Walking   ? How long can you sit comfortably? NA   ? How long can you stand comfortably? 30-60 min with support   ? How long can you walk comfortably? about 100 feet with RW, still limited to short distances;   ? Diagnostic tests MRI shows left basal ganglia infarct 10/21/20   ? Patient Stated Goals "Improve balance and walking and not need RW, get back to independent."   ? Currently in Pain? No/denies   ? Pain Onset In the past 7 days   ? ?  ?  ? ?  ? ? ? ? ? ? ?  ?  ?TREATMENT: ?Warm up on Nust[place] , BUE/BLE level 3-4 interval training x4 min with cues to keep spm >70 to challenge cardiovascular conditioning; completed 0.14 miles total;  ?Spo2 98%, HR 111 following activity; Reports moderate fatigue;  ?  ? NMR: ? ?Standing on 1/2 bolster (round side up): ?-BLE heel raises x15 reps ?-BLE toe raises x15 reps ?-Feet in neutral BLE apart, mini squat x10 reps ?Required BUE rail assist for safety ?Required cues to increase ROM for better ankle ROM for improved ankle strategies;  ?  ?Standing on 1/2 bolster (flat side up) ?Tandem stance with 1-0 rail assist 30 sec hold x2 reps each foot in front ? Progressed to head turns side/side x5 reps ? ?Standing in parallel bars: ?Airex beam: ?-side stepping x2 laps each direction ?-tandem stance: ? BUE ball pass side/side x5 reps each foot in front; ?Required CGA to min A for safety with increased time for regaining balance. Required min VCs for erect posture and neutral weight shift;  ? ?Gait cross overs x2 laps each direction (10 feet); ?Pt utilized railing for  balance. She was able to exhibit better coordination requiring fewer cues for sequencing;  ? ?  ? Gait around gym x50 feet  x2 sets with CGA, no assistive device, reciprocal gait pattern, does exhibit better step length and improved smoothness of gait following balance exercise. She does require CGA for safety with occasional misstep;  ? ?Weaving around cones #5 x2 sets; Requiring min A for safety. Pt does exhibit decreased stability when turning with wider base of support and slower gait speed;  ?   ?Pt educated throughout session about proper posture and technique with exercises. Improved exercise technique, movement at target joints, use of target muscles after min to mod verbal, visual, tactile cues. ?  ?Patient tolerated session well.  She does report mild fatigue at end of session.  ?  ?  ?  ? ? ? ? ? ? ? ? ? ? ? ? ? ? ? ? ? ? ? ? ? ? PT Education - 09/01/21 1354   ? ? Education Details exercise technique/positioning;   ? Person(s) Educated Patient   ? Methods Explanation;Verbal cues   ? Comprehension Verbalized understanding;Returned demonstration;Verbal cues required;Need further instruction   ? ?  ?  ? ?  ? ? ? PT Short Term Goals - 06/25/21 1151   ? ?  ? PT SHORT TERM GOAL #1  ? Title Patient will be adherent to HEP at least 3x a week to improve functional strength and balance for better safety at home.   ? Baseline 9/21: Pt reports compliance with HEP and is confident, 1/3: doing exercise 2x a week; 06/25/21: 3x/week   ? Time 4   ? Period Weeks   ? Status Achieved   ? Target Date 06/25/21   ?  ? PT SHORT TERM GOAL #2  ? Title Patient will be independent in transferring sit<>Stand without pushing on arm rests to improve ability to get up from chair.   ? Baseline 9/21: pt able to complete STS without use of UEs, 1/3:able to stand but has moderate difficulty requiring increased time; 06/25/21: able to perform indep without hands but requiring use of momentum to attain standing. Remains stable.   ? Time 4   ?  Period Weeks   ? Status Partially Met   ? Target Date 07/23/21   ? ?  ?  ? ?  ? ? ? ? PT Long Term Goals - 08/25/21 1356   ? ?  ? PT LONG TERM GOAL #1  ? Title Patient (> 49 years old) will complete five times s

## 2021-09-03 ENCOUNTER — Ambulatory Visit: Payer: Medicare HMO | Admitting: Occupational Therapy

## 2021-09-03 ENCOUNTER — Ambulatory Visit: Payer: Medicare HMO | Admitting: Physical Therapy

## 2021-09-03 ENCOUNTER — Encounter: Payer: Self-pay | Admitting: Occupational Therapy

## 2021-09-03 DIAGNOSIS — R2681 Unsteadiness on feet: Secondary | ICD-10-CM

## 2021-09-03 DIAGNOSIS — M6281 Muscle weakness (generalized): Secondary | ICD-10-CM | POA: Diagnosis not present

## 2021-09-03 DIAGNOSIS — R278 Other lack of coordination: Secondary | ICD-10-CM

## 2021-09-03 DIAGNOSIS — R2689 Other abnormalities of gait and mobility: Secondary | ICD-10-CM

## 2021-09-03 DIAGNOSIS — R482 Apraxia: Secondary | ICD-10-CM

## 2021-09-03 DIAGNOSIS — R262 Difficulty in walking, not elsewhere classified: Secondary | ICD-10-CM

## 2021-09-03 NOTE — Therapy (Signed)
[place] [facility] ?[place], [place], [address] ?Phone: [phone]   Fax:  [phone] ? ?Physical Therapy Treatment ? ?Patient Details  ?Name: Cassandra Schaefer ?MRN: [id number] ?Date of Birth: 04/03/1929 ?Referring Provider (PT): Dr. Dew/Dr. Ginette Pitman PCP ? ? ?Encounter Date: 09/03/2021 ? ? PT End of Session - 09/03/21 1404   ? ? Visit Number 13   ? Number of Visits 86   ? Date for PT Re-Evaluation 10/20/21   ? Authorization Type [employer]etna Medicare   ? PT Start Time 1350   ? PT Stop Time 1430   ? PT Time Calculation (min) 40 min   ? Equipment Utilized During Treatment Gait belt   ? Activity Tolerance Patient tolerated treatment well   ? Behavior During Therapy [facility] for tasks assessed/performed   ? ?  ?  ? ?  ? ? ?Past Medical History:  ?Diagnosis Date  ? Anemia   ? Cough   ? RESOLVING, NO FEVER  ? Diabetes mellitus without complication ([place])   ? Gout   ? Hypertension   ? Hypothyroidism   ? PUD (peptic ulcer disease)   ? Stroke [facility])   ? Wears dentures   ? full upper, partial lower  ? ? ?Past Surgical History:  ?Procedure Laterality Date  ? AMPUTATION TOE Left 12/14/2017  ? Procedure: AMPUTATION TOE-4TH MPJ;  Surgeon: Samara Deist, DPM;  Location: [place];  Service: Podiatry;  Laterality: Left;  IVA LOCAL ?Diabetic - oral meds  ? APPENDECTOMY    ? CATARACT EXTRACTION W/PHACO Right 06/23/2015  ? Procedure: CATARACT EXTRACTION PHACO AND INTRAOCULAR LENS PLACEMENT (IOC);  Surgeon: Estill Cotta, MD;  Location: [facility];  Service: Ophthalmology;  Laterality: Right;  [place] 01:28 ?AP% 23.4 ?CDE 36.14 ?fluid pack lot # [id number]  ? CATARACT EXTRACTION W/PHACO Left 07/28/2015  ? Procedure: CATARACT EXTRACTION PHACO AND INTRAOCULAR LENS PLACEMENT (IOC);  Surgeon: Estill Cotta, MD;  Location: [facility];  Service: Ophthalmology;  Laterality: Left;  [place]    1:29.7 ?AP%  24.9 ?CDE   41.32 ?fluid casette lot #[id number] H exp 09/23/2016  ? COONOSCOPY    ? AND ENDOSCOPY   ? DILATION AND CURETTAGE OF UTERUS    ? ENDARTERECTOMY Left 11/13/2020  ? Procedure: Evacuation of left neck hematoma;  Surgeon: Katha Cabal, MD;  Location: [facility];  Service: Vascular;  Laterality: Left;  ? ENDARTERECTOMY Left 11/13/2020  ? Procedure: ENDARTERECTOMY CAROTID;  Surgeon: Algernon Huxley, MD;  Location: [facility];  Service: Vascular;  Laterality: Left;  ? TONSILLECTOMY    ? TUBAL LIGATION    ? ? ?There were no vitals filed for this visit. ? ? Subjective Assessment - 09/04/21 0908   ? ? Subjective Patient reports doing well. No new complaints. She feels that her walking is improving at home and she is less unsteady.   ? Patient is accompained by: --   Daughter, [name]  ? Pertinent History 88 y.o. female with medical history significant for type 2 diabetes mellitus, essential hypertension, acquired hypothyroidism, hyperlipidemia, stage III chronic kidney disease reports increased right sided weakness on 10/19/20. She was diagnosed with left basal ganglia ischemic stroke. She was discharged to inpatient rehab for 10 days and then discharged home. Patient was evaluated by outpatient PT on 11/12/20. CVA was due to carotid stenosis. She underwent left carotid endartectomy on 7/21. Procedure went well however patient suffered from left cervical hematoma and had subsequent surgical procedure to stop the bleed and  remove the hematoma on 11/13/20. They were concerned with her breathing and therefore intubated her for 1 day, she was in ICU for 2 days and transferred to med/surge on 11/15/20. Patient was discharged home on 11/17/20 and is now returning to outpatient PT. She has had the stiches removed and is healing well. Denies any neck pain or stiffness. She lives with her daughter and has 24/7 caregiver support. She is still using RW for most ambulation. She is able to transfer to bedside commode well and is able to stand short time unsupported. She reports feeling very weak and fatigued. She reports she has been  dragging her right foot more. She denies any numbness/tingling; She reports feeling like her legs are wanting to do more but is hesitant because of fear of falling. She is mod I for self care morning routine. She does still receive help with showering. She has been working on increasing her activity but denies any formal exercise.   ? Limitations Standing;Walking   ? How long can you sit comfortably? NA   ? How long can you stand comfortably? 30-60 min with support   ? How long can you walk comfortably? about 100 feet with RW, still limited to short distances;   ? Diagnostic tests MRI shows left basal ganglia infarct 10/21/20   ? Patient Stated Goals "Improve balance and walking and not need RW, get back to independent."   ? Currently in Pain? No/denies   ? Pain Onset In the past 7 days   ? Multiple Pain Sites No   ? ?  ?  ? ?  ? ? ? ? ?TREATMENT: ?Exercise: ?Standing on bottom step Hip flexion march against green tband with toe tap to 3rd step x10 reps each LE; ? ?Standing heel off step calf stretch 30 sec hold x1 rep each LE; ? ?Gait around gym x180 feet ? Side stepping x10 feet x4 laps unsupported ?Pt exhibits better stability when walking forward with less unsteadiness. Although she does require CGA to min A for recovery from occasional mis-step; ?She does exhibit increased foot drag on RLE when side stepping, likely from fatigue requiring min A for safety;  ? ?Leg press: ?BLE 40# 2x15 ?Tolerated well, reports moderate difficulty but no pain. Able to tolerate increased resistance compared to previous sessions;  ? ? ?Stepping over 1/2 bolster  ?Side stepping x10 reps ?Forward/backward x8 reps ?Able to progress without rail assist with increased challenge, requiring min A for safety; ? ?Standing on LLE and stepping over 1/2 bolster with RLE only and then back to challenge motor control and stability. 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 ?Tolerated session well. Reports fatigue but denies any soreness or pain;  ? ?During rest break, Patient expressed that she has stopped using bedside commode due to not wanting to have to rely on that and have her daughters have to clean it daily. Patient does have to negotiate steps when using the bathroom as her home is a split level. ?Educated patient that she could get liners for the bedside commode that she could empty herself if she chose to. This would help improve safety at night or first in the morning if she needed to use the bathroom. ? ? ? ? ? ? ? ? ? ? ? ? ? ? ? ? ? ? ? ? ? ? PT Education -  09/04/21 0908   ? ? Education Details exercise technique/liners for bedside commode   ? Person(s) Educated Patient   ? Methods Explanation;Verbal cues   ? Comprehension Verbalized understanding;Returned demonstration;Verbal cues required;Need further instruction   ? ?  ?  ? ?  ? ? ? PT Short Term Goals - 06/25/21 1151   ? ?  ? PT SHORT TERM GOAL #1  ? Title Patient will be adherent to HEP at least 3x a week to improve functional strength and balance for better safety at home.   ? Baseline 9/21: Pt reports compliance with HEP and is confident, 1/3: doing exercise 2x a week; 06/25/21: 3x/week   ? Time 4   ? Period Weeks   ? Status Achieved   ? Target Date 06/25/21   ?  ? PT SHORT TERM GOAL #2  ? Title Patient will be independent in transferring sit<>Stand without pushing on arm rests to improve ability to get up from chair.   ? Baseline 9/21: pt able to complete STS without use of UEs, 1/3:able to stand but has moderate difficulty requiring increased time; 06/25/21: able to perform indep without hands but requiring use of momentum to attain standing. Remains stable.   ? Time 4   ? Period Weeks   ? Status Partially Met   ? Target Date 07/23/21   ? ?  ?  ? ?  ? ? ? ? PT Long Term Goals - 08/25/21 1356   ? ?  ? PT LONG TERM GOAL #1  ? Title  Patient (> 62 years old) will complete five times sit to stand test in < 15 seconds indicating an increased LE strength and improved balance.   ? Baseline 9/21: 29 seconds hands-free 10/10: deferred 10/12: 34 sec ha

## 2021-09-03 NOTE — Therapy (Addendum)
[place] ?[facility] ?[address] ?[place], [place], [address] ?Phone: [phone]   Fax:  [phone] ? ?Occupational Therapy Progress Note ? ?Dates of reporting period  07/30/2021   to   09/03/2021  ? ?Patient Details  ?Name: Cassandra Schaefer ?MRN: [id number] ?Date of Birth: Oct 10, 1928 ?Referring Provider (OT): Dr. Barbette Reichmann ? ? ?Encounter Date: 09/03/2021 ? ? OT End of Session - 09/03/21 1302   ? ? Visit Number [id number]   ? Number of Visits 90   ? Date for OT Re-Evaluation 10/06/21   ? Authorization Time Period Reporting period starting 06/18/2020   ? OT Start Time ?End Time ?Total time 1300 ?1345 ?45 min.  ? Activity Tolerance Patient tolerated treatment well   ? Behavior During Therapy [facility] for tasks assessed/performed   ? ?  ?  ? ?  ? ? ?Past Medical History:  ?Diagnosis Date  ? Anemia   ? Cough   ? RESOLVING, NO FEVER  ? Diabetes mellitus without complication (HCC)   ? Gout   ? Hypertension   ? Hypothyroidism   ? PUD (peptic ulcer disease)   ? Stroke [facility])   ? Wears dentures   ? full upper, partial lower  ? ? ?Past Surgical History:  ?Procedure Laterality Date  ? AMPUTATION TOE Left 12/14/2017  ? Procedure: AMPUTATION TOE-4TH MPJ;  Surgeon: Gwyneth Revels, DPM;  Location: [facility];  Service: Podiatry;  Laterality: Left;  IVA LOCAL ?Diabetic - oral meds  ? APPENDECTOMY    ? CATARACT EXTRACTION W/PHACO Right 06/23/2015  ? Procedure: CATARACT EXTRACTION PHACO AND INTRAOCULAR LENS PLACEMENT (IOC);  Surgeon: Sallee Lange, MD;  Location: [facility];  Service: Ophthalmology;  Laterality: Right;  [place] 01:28 ?AP% 23.4 ?CDE 36.14 ?fluid pack lot # [id number]  ? CATARACT EXTRACTION W/PHACO Left 07/28/2015  ? Procedure: CATARACT EXTRACTION PHACO AND INTRAOCULAR LENS PLACEMENT (IOC);  Surgeon: Sallee Lange, MD;  Location: [facility];  Service: Ophthalmology;  Laterality: Left;  [place]    1:29.7 ?AP%  24.9 ?CDE   41.32 ?fluid casette lot #[id number] H exp 09/23/2016  ? COONOSCOPY     ? AND ENDOSCOPY  ? DILATION AND CURETTAGE OF UTERUS    ? ENDARTERECTOMY Left 11/13/2020  ? Procedure: Evacuation of left neck hematoma;  Surgeon: Renford Dills, MD;  Location: [facility];  Service: Vascular;  Laterality: Left;  ? ENDARTERECTOMY Left 11/13/2020  ? Procedure: ENDARTERECTOMY CAROTID;  Surgeon: Annice Needy, MD;  Location: [facility];  Service: Vascular;  Laterality: Left;  ? TONSILLECTOMY    ? TUBAL LIGATION    ? ? ?There were no vitals filed for this visit. ? ? Subjective Assessment - 09/03/21 1302   ? ? Subjective  Pt. reports doing well today.   ? Patient is accompanied by: Family member   ? Pertinent History R ankle pain, gout, T2DM, HTN, CKDIII; R/L carotid endarectomy   ? Currently in Pain? No/denies   ? ?  ?  ? ?  ? ? ? ? ? [facility] OT Assessment - 09/03/21 1304   ? ?  ? Coordination  ? Right 9 Hole Peg Test 53   ?  ? Hand Function  ? Right Hand Grip (lbs) 30   ? Right Hand Lateral Pinch 14 lbs   ? Right Hand 3 Point Pinch 9 lbs   ? Left Hand Grip (lbs) 28   ? Left Hand Lateral Pinch 15 lbs   ? Left 3 point pinch 10  lbs   ? ?  ?  ? ?  ? ?Measurements were obtained, and goals were reviewed with the pt. Pt. Has made excellent progress with RUE strength, and [facility] skills.  Pt. is now able to fasten snaps, turn pages, pick up medication from the pillbox while dropping less, perform self-feeding with the right hand, brush her teeth with her right hand, press a squirt bottle, and press a lotion/soap pump. Pt.'s FOTO score has improved to 73. Pt. continues to present with limited right hand strength, motor control, and [facility] skills. Pt. continues to work on improving these skills in order to improve RUE functioning to maximize overall independence with ADLs, and IADL tasks. ? ? ? ? ? ? ? ? ? ? ? ? ? ? ? OT Education - 09/03/21 1302   ? ? Education Details RUE functioning.   ? Person(s) Educated Patient   ? Methods Explanation;Verbal cues;Demonstration;Tactile cues   ? Comprehension Verbalized  understanding;Verbal cues required;Returned demonstration   ? ?  ?  ? ?  ? ? ? OT Short Term Goals - 03/23/21 1657   ? ?  ? OT SHORT TERM GOAL #1  ? Title Pt will perform HEP for RUE strength/coordination independently.   ? Baseline Eval: HEP not yet established in outpatient, but pt is working with putty from [name]; 12/04/2020; [facility] HEP initiated this day with mod vc after return demo; 01/05/2021: indep with currently program, but ongoing as pt progresses; 01/28/2021: vc to revisit and focus on grip and pinch strengthening with putty; 03/23/21: pt is indep with HEP   ? Time 6   ? Period Weeks   ? Status Achieved   ? Target Date 03/23/21   ? ?  ?  ? ?  ? ? ? ? OT Long Term Goals - 09/03/21 1313   ? ?  ? OT LONG TERM GOAL #1  ? Title Pt will improve hand writing to 100% legibility with R hand to be able to independently write a check.   ? Baseline Eval: signature is 75% legible with R hand, requiring extra time; 12/04/2020: no chan to complete.ge from eval; 01/05/2021: Printing is 100% legible, signature is 90% legible, but still requires extra time and effort.; 01/26/21: Signature is 90% legible and speed/fluidity have improved. 20th visit: 90% legible, 03/23/21: 90% legible, extra time 40th visit 90 % legibility with increased time, 50th visit: 90% legible with with increased time required 07/14/2021: 90% legibility. 07/30/2021: 90% legibility, pt. presses too hard through the pen, and pencil. 09/03/2021: 90% legibility   ? Time 12   ? Period Weeks   ? Status On-going   ? Target Date 10/06/21   ?  ? OT LONG TERM GOAL #2  ? Title Pt will improve R hand coordination to enable good manipulation of iphone using R dominant hand   ?  ? OT LONG TERM GOAL #3  ? Title Pt will improve GMC throughout RUE to enable pt to reach for and pick up ADL supplies with R dominant hand without dropping or knocking over objects.   ? Baseline Eval: Pt reports RUE is clumsy and easily knocks over objects when trying to reach for ADL supplies.  Pt  verbalizes that her "aim is off."; 12/04/2020: pt reports slight improvement since eval and has started using her R hand to eat, but still feels clumsy; 01/05/2021: Greatly improved; pt is using silverware to eat with R hand, can comb hair with RUE, still mildly ataxic; 01/26/21: pt  reports difficulty picking up objects within a small space (ie; may knock the toothpaste holder down when reaching for toothbrush), but reaching for light/larger objects is improving 20th visit: pt. continues to be mildly ataxic, however is consistently improving with motor control, and FMC while reaching targets; 03/23/21: Pt reaches with accuracy towards targets if she takes her time (pt drops and knocks things over when moving at a faster/normal pace) 04/23/2021: Pt. continues to present with difficulty with depth perception, and impaired FMC.40th visit: Pt. is improving, however tends to knock over lighter weight objects.50th visit: Pt. has improved with reaching for items without knocking them over, however continues to need work on accuracy.07/14/2021: Pt. is improving while motor control, and accuracy of reaching for objects. Pt. continues to work towards improving efficiency with reaching for objects. 07/30/2021: Pt. is able to able to reach for objects more accurately, however continues to present with limited motor control in the Right. 09/03/2021: Pt. Pt. is improving with reaching up without knocking items over. pt. is now able to stand at the sink and reach up without dropping items.   ? Time 12   ? Period Weeks   ? Status On-going   ? Target Date 10/06/21   ?  ? OT LONG TERM GOAL #4  ? Title Pt will improve FOTO score to 68 or better to indicate measurable functional improvement.   ? Baseline Eval: FOTO 55; 01/05/2021: FOTO 61; 01/28/21: FOTO 61, 20th visit: 61; 03/23/21: FOTO 60 4oth visit: FOTO score: 61, 50th visit: FOTO 69 07/30/2021: FOTO 71  09/03/2021: FOTO 73   ? Time 12   ? Period Weeks   ? Status On-going   ? Target Date  10/06/21   ?  ? OT LONG TERM GOAL #5  ? Time 12   ?  ? OT LONG TERM GOAL #6  ? Title Pt. will improve right hand [facility] skills to be able to indepedendently manipulate and set-up her medication/pills in the pillbox.   ? Base

## 2021-09-04 ENCOUNTER — Encounter: Payer: Self-pay | Admitting: Physical Therapy

## 2021-09-08 ENCOUNTER — Ambulatory Visit: Payer: Medicare HMO | Admitting: Physical Therapy

## 2021-09-08 ENCOUNTER — Ambulatory Visit: Payer: Medicare HMO | Admitting: Occupational Therapy

## 2021-09-08 ENCOUNTER — Encounter: Payer: Self-pay | Admitting: Physical Therapy

## 2021-09-08 ENCOUNTER — Encounter: Payer: Self-pay | Admitting: Occupational Therapy

## 2021-09-08 DIAGNOSIS — R482 Apraxia: Secondary | ICD-10-CM

## 2021-09-08 DIAGNOSIS — R2681 Unsteadiness on feet: Secondary | ICD-10-CM

## 2021-09-08 DIAGNOSIS — M6281 Muscle weakness (generalized): Secondary | ICD-10-CM | POA: Diagnosis not present

## 2021-09-08 DIAGNOSIS — R2689 Other abnormalities of gait and mobility: Secondary | ICD-10-CM

## 2021-09-08 DIAGNOSIS — R278 Other lack of coordination: Secondary | ICD-10-CM

## 2021-09-08 DIAGNOSIS — R262 Difficulty in walking, not elsewhere classified: Secondary | ICD-10-CM

## 2021-09-08 NOTE — Therapy (Signed)
[place] ?[facility] ?[place], [place], [address] ?Phone: [phone]   Fax:  [phone] ? ?Physical Therapy Treatment ? ?Patient Details  ?Name: Cassandra Schaefer ?MRN: [id number] ?Date of Birth: [birth year] ?Referring Provider (PT): Dr. Dew/Dr. Ginette Pitman PCP ? ? ?Encounter Date: 09/08/2021 ? ? PT End of Session - 09/08/21 1345   ? ? Visit Number 22   ? Number of Visits 86   ? Date for PT Re-Evaluation 10/20/21   ? Authorization Type [employer]   ? PT Start Time 1344   ? PT Stop Time 1428   ? PT Time Calculation (min) 44 min   ? Equipment Utilized During Treatment Gait belt   ? Activity Tolerance Patient tolerated treatment well   ? Behavior During Therapy [name] for tasks assessed/performed   ? ?  ?  ? ?  ? ? ?Past Medical History:  ?Diagnosis Date  ? Anemia   ? Cough   ? RESOLVING, NO FEVER  ? Diabetes mellitus without complication ([place])   ? Gout   ? Hypertension   ? Hypothyroidism   ? PUD (peptic ulcer disease)   ? Stroke [facility])   ? Wears dentures   ? full upper, partial lower  ? ? ?Past Surgical History:  ?Procedure Laterality Date  ? AMPUTATION TOE Left 12/14/2017  ? Procedure: AMPUTATION TOE-4TH MPJ;  Surgeon: Samara Deist, DPM;  Location: [place];  Service: Podiatry;  Laterality: Left;  IVA LOCAL ?Diabetic - oral meds  ? APPENDECTOMY    ? CATARACT EXTRACTION W/PHACO Right 06/23/2015  ? Procedure: CATARACT EXTRACTION PHACO AND INTRAOCULAR LENS PLACEMENT (IOC);  Surgeon: Estill Cotta, MD;  Location: [facility];  Service: Ophthalmology;  Laterality: Right;  [place] 01:28 ?AP% 23.4 ?CDE 36.14 ?fluid pack lot # [id number]  ? CATARACT EXTRACTION W/PHACO Left 07/28/2015  ? Procedure: CATARACT EXTRACTION PHACO AND INTRAOCULAR LENS PLACEMENT (IOC);  Surgeon: Estill Cotta, MD;  Location: [facility];  Service: Ophthalmology;  Laterality: Left;  [place]    1:29.7 ?AP%  24.9 ?CDE   41.32 ?fluid casette lot #[id number] H exp 09/23/2016  ? COONOSCOPY    ? AND ENDOSCOPY   ? DILATION AND CURETTAGE OF UTERUS    ? ENDARTERECTOMY Left 11/13/2020  ? Procedure: Evacuation of left neck hematoma;  Surgeon: Katha Cabal, MD;  Location: [facility];  Service: Vascular;  Laterality: Left;  ? ENDARTERECTOMY Left 11/13/2020  ? Procedure: ENDARTERECTOMY CAROTID;  Surgeon: Algernon Huxley, MD;  Location: [facility];  Service: Vascular;  Laterality: Left;  ? TONSILLECTOMY    ? TUBAL LIGATION    ? ? ?There were no vitals filed for this visit. ? ? Subjective Assessment - 09/08/21 1344   ? ? Subjective Patient reports doing well; She denies any new complaints; No new falls or stumbles;   ? Patient is accompained by: --   Daughter, [name]  ? Pertinent History 88 y.o. female with medical history significant for type 2 diabetes mellitus, essential hypertension, acquired hypothyroidism, hyperlipidemia, stage III chronic kidney disease reports increased right sided weakness on 10/19/20. She was diagnosed with left basal ganglia ischemic stroke. She was discharged to inpatient rehab for 10 days and then discharged home. Patient was evaluated by outpatient PT on 11/12/20. CVA was due to carotid stenosis. She underwent left carotid endartectomy on 7/21. Procedure went well however patient suffered from left cervical hematoma and had subsequent surgical procedure to stop the bleed and remove the hematoma on 11/13/20. They were  concerned with her breathing and therefore intubated her for 1 day, she was in ICU for 2 days and transferred to med/surge on 11/15/20. Patient was discharged home on 11/17/20 and is now returning to outpatient PT. She has had the stiches removed and is healing well. Denies any neck pain or stiffness. She lives with her daughter and has 24/7 caregiver support. She is still using RW for most ambulation. She is able to transfer to bedside commode well and is able to stand short time unsupported. She reports feeling very weak and fatigued. She reports she has been dragging her right foot more. She  denies any numbness/tingling; She reports feeling like her legs are wanting to do more but is hesitant because of fear of falling. She is mod I for self care morning routine. She does still receive help with showering. She has been working on increasing her activity but denies any formal exercise.   ? Limitations Standing;Walking   ? How long can you sit comfortably? NA   ? How long can you stand comfortably? 30-60 min with support   ? How long can you walk comfortably? about 100 feet with RW, still limited to short distances;   ? Diagnostic tests MRI shows left basal ganglia infarct 10/21/20   ? Patient Stated Goals "Improve balance and walking and not need RW, get back to independent."   ? Currently in Pain? No/denies   ? Pain Onset In the past 7 days   ? ?  ?  ? ?  ? ? ? ? ? ? ? ?  ?  ?TREATMENT: ?Exercise: ?Forward step ups on 6 inch step with 2 rail assist x10 reps each LE;  ?Standing on bottom step Hip flexion march against green tband with toe tap to 3rd step x10 reps each LE; ?Pt reports moderate difficulty, especially on RLE;  ? ?NMR ?Forward walking from firm surface across red mat and then back to firm surface x6 laps with min A for safety; Pt does exhibits increased apraxia and decreased coordination with RLE with uneven cadence/step length with increased repetition/fatigue; ?Side stepping firm , across red mat and then back to firm surface: x2 laps each direction ?Pt reports moderate difficulty. She states,"It feels like my right foot is glued to the mat". She required min A and cues for increased step length. She exhibits increased RLE foot drag with increased repetition;  ?  ? ?Standing on 1/2 bolster: ?(Round side up): ?-heel raises x15 reps ?-toe raises x15 reps ?Pt required CGA and cues for increased ROM for better ankle strengthening ?(Flat side up) ?Feet apart: ? Unsupported standing 30 sec hold x2 reps ? Progressed to unsupported standing with head turns side/side x5 reps, up/down x5 reps ?Pt  does exhibit decreased ankle strategies, requiring min A for balance control exhibits forward/backward loss of balance;  ?  ?Instructed patient in obstacle course: ?Weaving around #4 cones, stepping up on airex pad unsupported, stepping over 1/2 bolster unsupported ?X4 laps with Min A to CGA and cues for heel strike/erect posture; ? ?Re-educated patient in SLS as part of HEP, progressed with patient reducing HHA from 2 to 1 to challenge stance control ?Pt able to hold SLS:R: 3 sec, L: 15 sec ?  ?Tolerated session well. Reports fatigue but denies any soreness or pain;  ?  ? ? ? ? ? ? ? ? ? ? ? ? ? ? ? ? ? ? ? ? PT Education - 09/08/21 1345   ? ?  Education Details exercise technique/positioning;   ? Person(s) Educated Patient   ? Methods Explanation;Verbal cues   ? Comprehension Verbalized understanding;Returned demonstration;Verbal cues required;Need further instruction   ? ?  ?  ? ?  ? ? ? PT Short Term Goals - 06/25/21 1151   ? ?  ? PT SHORT TERM GOAL #1  ? Title Patient will be adherent to HEP at least 3x a week to improve functional strength and balance for better safety at home.   ? Baseline 9/21: Pt reports compliance with HEP and is confident, 1/3: doing exercise 2x a week; 06/25/21: 3x/week   ? Time 4   ? Period Weeks   ? Status Achieved   ? Target Date 06/25/21   ?  ? PT SHORT TERM GOAL #2  ? Title Patient will be independent in transferring sit<>Stand without pushing on arm rests to improve ability to get up from chair.   ? Baseline 9/21: pt able to complete STS without use of UEs, 1/3:able to stand but has moderate difficulty requiring increased time; 06/25/21: able to perform indep without hands but requiring use of momentum to attain standing. Remains stable.   ? Time 4   ? Period Weeks   ? Status Partially Met   ? Target Date 07/23/21   ? ?  ?  ? ?  ? ? ? ? PT Long Term Goals - 08/25/21 1356   ? ?  ? PT LONG TERM GOAL #1  ? Title Patient (> 1 years old) will complete five times sit to stand test in < 15  seconds indicating an increased LE strength and improved balance.   ? Baseline 9/21: 29 seconds hands-free 10/10: deferred 10/12: 34 sec hands free; 11/2: 23.8 sec hands-free; 12/7: 33.2 sec hands free, 1/3:

## 2021-09-08 NOTE — Therapy (Signed)
[place] ?[facility] ?[address] ?[place], [place], [address] ?Phone: [phone]   Fax:  [phone] ? ?Occupational Therapy Treatment ? ?Patient Details  ?Name: Cassandra Schaefer ?MRN: [id number] ?Date of Birth: 11-Mar-1929 ?Referring Provider (OT): Dr. Barbette Reichmann ? ? ?Encounter Date: 09/08/2021 ? ? OT End of Session - 09/08/21 1319   ? ? Visit Number [id number]   ? Number of Visits 90   ? Date for OT Re-Evaluation 10/06/21   ? Authorization Time Period Reporting period starting 06/18/2020   ? OT Start Time 1300   ? OT Stop Time 1345   ? OT Time Calculation (min) 45 min   ? Activity Tolerance Patient tolerated treatment well   ? Behavior During Therapy [facility] for tasks assessed/performed   ? ?  ?  ? ?  ? ? ?Past Medical History:  ?Diagnosis Date  ? Anemia   ? Cough   ? RESOLVING, NO FEVER  ? Diabetes mellitus without complication (HCC)   ? Gout   ? Hypertension   ? Hypothyroidism   ? PUD (peptic ulcer disease)   ? Stroke [facility])   ? Wears dentures   ? full upper, partial lower  ? ? ?Past Surgical History:  ?Procedure Laterality Date  ? AMPUTATION TOE Left 12/14/2017  ? Procedure: AMPUTATION TOE-4TH MPJ;  Surgeon: Gwyneth Revels, DPM;  Location: [facility];  Service: Podiatry;  Laterality: Left;  IVA LOCAL ?Diabetic - oral meds  ? APPENDECTOMY    ? CATARACT EXTRACTION W/PHACO Right 06/23/2015  ? Procedure: CATARACT EXTRACTION PHACO AND INTRAOCULAR LENS PLACEMENT (IOC);  Surgeon: Sallee Lange, MD;  Location: [facility];  Service: Ophthalmology;  Laterality: Right;  [place] 01:28 ?AP% 23.4 ?CDE 36.14 ?fluid pack lot # [id number]  ? CATARACT EXTRACTION W/PHACO Left 07/28/2015  ? Procedure: CATARACT EXTRACTION PHACO AND INTRAOCULAR LENS PLACEMENT (IOC);  Surgeon: Sallee Lange, MD;  Location: [facility];  Service: Ophthalmology;  Laterality: Left;  [place]    1:29.7 ?AP%  24.9 ?CDE   41.32 ?fluid casette lot #[id number] H exp 09/23/2016  ? COONOSCOPY    ? AND ENDOSCOPY  ? DILATION AND  CURETTAGE OF UTERUS    ? ENDARTERECTOMY Left 11/13/2020  ? Procedure: Evacuation of left neck hematoma;  Surgeon: Renford Dills, MD;  Location: [facility];  Service: Vascular;  Laterality: Left;  ? ENDARTERECTOMY Left 11/13/2020  ? Procedure: ENDARTERECTOMY CAROTID;  Surgeon: Annice Needy, MD;  Location: [facility];  Service: Vascular;  Laterality: Left;  ? TONSILLECTOMY    ? TUBAL LIGATION    ? ? ?There were no vitals filed for this visit. ? ? Subjective Assessment - 09/08/21 1318   ? ? Subjective  Pt. reports doing well today.   ? Patient is accompanied by: Family member   ? Pertinent History R ankle pain, gout, T2DM, HTN, CKDIII; R/L carotid endarectomy   ? Currently in Pain? No/denies   ? ?  ?  ? ?  ? ?OT TREATMENT   ? ?Therapeutic Ex.: ? ?Pt. worked on Marketing executive in the right hand for lateral, and 3pt. pinch using yellow, red, green, and blue resistive clips. Pt. worked on placing the clips at various horizontal angles, with emphasis placed on precision, and accuracy with reaching the target. Tactile and verbal cues were required for eliciting the desired movement.  ?  ?Neuro muscular re-education: ?  ?Pt. worked on sustaining right hand grasp on resistive tweezers while  grasping 1/8" small cubes from a  cylindrical container while improving precision needed to avoid touching the side of the cylindrical container. Pt. Worked grasping, and storing the cubes, and translatory movements moving them from her palm to the the tip of her 2nd digit, and thumb. ?  ?Pt. continues to make progress with RUE functioning, and the accuracy of reaching precise targets. Pt. continues to more accurately be able to manipulate, and hold her pills when setting up her pillbox this week. Pt. was able to grasp and store 13 small cubes, however dropped multiple cubes when attempting translatory movements moving them through her palm. Pt. continues to have difficulty with performing translatroy movements. Pt. Continues to present  with weakness, and decreased [facility] skills in order to improve UE functioning during ADL, and IADL skills. ?  ? ? ? ? ? ? ? ? ? ? ? ? ? ? ? ? ? ? ? ? ? ? OT Education - 09/08/21 1319   ? ? Education Details RUE functioning.   ? Person(s) Educated Patient   ? Methods Explanation;Verbal cues;Demonstration;Tactile cues   ? Comprehension Verbalized understanding;Verbal cues required;Returned demonstration   ? ?  ?  ? ?  ? ? ? OT Short Term Goals - 03/23/21 1657   ? ?  ? OT SHORT TERM GOAL #1  ? Title Pt will perform HEP for RUE strength/coordination independently.   ? Baseline Eval: HEP not yet established in outpatient, but pt is working with putty from [name]; 12/04/2020; [facility] HEP initiated this day with mod vc after return demo; 01/05/2021: indep with currently program, but ongoing as pt progresses; 01/28/2021: vc to revisit and focus on grip and pinch strengthening with putty; 03/23/21: pt is indep with HEP   ? Time 6   ? Period Weeks   ? Status Achieved   ? Target Date 03/23/21   ? ?  ?  ? ?  ? ? ? ? OT Long Term Goals - 09/03/21 1313   ? ?  ? OT LONG TERM GOAL #1  ? Title Pt will improve hand writing to 100% legibility with R hand to be able to independently write a check.   ? Baseline Eval: signature is 75% legible with R hand, requiring extra time; 12/04/2020: no chan to complete.ge from eval; 01/05/2021: Printing is 100% legible, signature is 90% legible, but still requires extra time and effort.; 01/26/21: Signature is 90% legible and speed/fluidity have improved. 20th visit: 90% legible, 03/23/21: 90% legible, extra time 40th visit 90 % legibility with increased time, 50th visit: 90% legible with with increased time required 07/14/2021: 90% legibility. 07/30/2021: 90% legibility, pt. presses too hard through the pen, and pencil. 09/03/2021: 90% legibility   ? Time 12   ? Period Weeks   ? Status On-going   ? Target Date 10/06/21   ?  ? OT LONG TERM GOAL #2  ? Title Pt will improve R hand coordination to enable good  manipulation of iphone using R dominant hand   ?  ? OT LONG TERM GOAL #3  ? Title Pt will improve GMC throughout RUE to enable pt to reach for and pick up ADL supplies with R dominant hand without dropping or knocking over objects.   ? Baseline Eval: Pt reports RUE is clumsy and easily knocks over objects when trying to reach for ADL supplies.  Pt verbalizes that her "aim is off."; 12/04/2020: pt reports slight improvement since eval and has started using her R hand to eat, but still feels clumsy; 01/05/2021:  Greatly improved; pt is using silverware to eat with R hand, can comb hair with RUE, still mildly ataxic; 01/26/21: pt reports difficulty picking up objects within a small space (ie; may knock the toothpaste holder down when reaching for toothbrush), but reaching for light/larger objects is improving 20th visit: pt. continues to be mildly ataxic, however is consistently improving with motor control, and FMC while reaching targets; 03/23/21: Pt reaches with accuracy towards targets if she takes her time (pt drops and knocks things over when moving at a faster/normal pace) 04/23/2021: Pt. continues to present with difficulty with depth perception, and impaired FMC.40th visit: Pt. is improving, however tends to knock over lighter weight objects.50th visit: Pt. has improved with reaching for items without knocking them over, however continues to need work on accuracy.07/14/2021: Pt. is improving while motor control, and accuracy of reaching for objects. Pt. continues to work towards improving efficiency with reaching for objects. 07/30/2021: Pt. is able to able to reach for objects more accurately, however continues to present with limited motor control in the Right. 09/03/2021: Pt. Pt. is improving with reaching up without knocking items over. pt. is now able to stand at the sink and reach up without dropping items.   ? Time 12   ? Period Weeks   ? Status On-going   ? Target Date 10/06/21   ?  ? OT LONG TERM GOAL #4  ?  Title Pt will improve FOTO score to 68 or better to indicate measurable functional improvement.   ? Baseline Eval: FOTO 55; 01/05/2021: FOTO 61; 01/28/21: FOTO 61, 20th visit: 61; 03/23/21: FOTO 60 4oth visit: FOTO

## 2021-09-10 ENCOUNTER — Ambulatory Visit: Payer: Medicare HMO | Admitting: Physical Therapy

## 2021-09-10 ENCOUNTER — Encounter: Payer: Self-pay | Admitting: Occupational Therapy

## 2021-09-10 ENCOUNTER — Ambulatory Visit: Payer: Medicare HMO | Admitting: Occupational Therapy

## 2021-09-10 ENCOUNTER — Encounter: Payer: Self-pay | Admitting: Physical Therapy

## 2021-09-10 DIAGNOSIS — M6281 Muscle weakness (generalized): Secondary | ICD-10-CM | POA: Diagnosis not present

## 2021-09-10 DIAGNOSIS — R482 Apraxia: Secondary | ICD-10-CM

## 2021-09-10 DIAGNOSIS — R2689 Other abnormalities of gait and mobility: Secondary | ICD-10-CM

## 2021-09-10 DIAGNOSIS — R262 Difficulty in walking, not elsewhere classified: Secondary | ICD-10-CM

## 2021-09-10 DIAGNOSIS — R278 Other lack of coordination: Secondary | ICD-10-CM

## 2021-09-10 DIAGNOSIS — R2681 Unsteadiness on feet: Secondary | ICD-10-CM

## 2021-09-10 DIAGNOSIS — R269 Unspecified abnormalities of gait and mobility: Secondary | ICD-10-CM

## 2021-09-10 NOTE — Therapy (Signed)
[place] MAIN [facility] SERVICES [address] [place], [place], [address] Phone: [phone]   Fax:  [phone]  Physical Therapy Treatment Physical Therapy Progress Note   Dates of reporting period  08/06/21   to   09/10/21   Patient Details  Name: Cassandra Schaefer MRN: [id number] Date of Birth: May 25, 1928 Referring Provider (PT): Dr. Dew/Dr. Ginette Pitman PCP   Encounter Date: 09/10/2021   PT End of Session - 09/10/21 1353     Visit Number [id number]    Number of Visits 16    Date for PT Re-Evaluation 10/20/21    Authorization Type [employer]    PT Start Time 1349    PT Stop Time 1430    PT Time Calculation (min) 41 min    Equipment Utilized During Treatment Gait belt    Activity Tolerance Patient tolerated treatment well    Behavior During Therapy WFL for tasks assessed/performed             Past Medical History:  Diagnosis Date   Anemia    Cough    RESOLVING, NO FEVER   Diabetes mellitus without complication ([place])    Gout    Hypertension    Hypothyroidism    PUD (peptic ulcer disease)    Stroke [facility])    Wears dentures    full upper, partial lower    Past Surgical History:  Procedure Laterality Date   AMPUTATION TOE Left 12/14/2017   Procedure: AMPUTATION TOE-4TH MPJ;  Surgeon: Samara Deist, DPM;  Location: [place];  Service: Podiatry;  Laterality: Left;  IVA LOCAL Diabetic - oral meds   APPENDECTOMY     CATARACT EXTRACTION W/PHACO Right 06/23/2015   Procedure: CATARACT EXTRACTION PHACO AND INTRAOCULAR LENS PLACEMENT (IOC);  Surgeon: Estill Cotta, MD;  Location: [facility];  Service: Ophthalmology;  Laterality: Right;  [place] 01:28 AP% 23.4 CDE 36.14 fluid pack lot # [id number]   CATARACT EXTRACTION W/PHACO Left 07/28/2015   Procedure: CATARACT EXTRACTION PHACO AND INTRAOCULAR LENS PLACEMENT (IOC);  Surgeon: Estill Cotta, MD;  Location: [facility];  Service: Ophthalmology;  Laterality: Left;  [place]    1:29.7 AP%   24.9 CDE   41.32 fluid casette lot #[id number] H exp 09/23/2016   COONOSCOPY     AND ENDOSCOPY   DILATION AND CURETTAGE OF UTERUS     ENDARTERECTOMY Left 11/13/2020   Procedure: Evacuation of left neck hematoma;  Surgeon: Katha Cabal, MD;  Location: [facility];  Service: Vascular;  Laterality: Left;   ENDARTERECTOMY Left 11/13/2020   Procedure: ENDARTERECTOMY CAROTID;  Surgeon: Algernon Huxley, MD;  Location: [facility];  Service: Vascular;  Laterality: Left;   TONSILLECTOMY     TUBAL LIGATION      There were no vitals filed for this visit.   Subjective Assessment - 09/10/21 1352     Subjective Pt reports increased RLE ankle discomfort/swelling; She reports it would hurt during SLS. She reports ice on it yesterday and elevated and reports its not as sore today; She prefers to take it easy today;    Patient is accompained by: --   Daughter, [name]   Pertinent History 88 y.o. female with medical history significant for type 2 diabetes mellitus, essential hypertension, acquired hypothyroidism, hyperlipidemia, stage III chronic kidney disease reports increased right sided weakness on 10/19/20. She was diagnosed with left basal ganglia ischemic stroke. She was discharged to inpatient rehab for 10 days and then discharged home. Patient was evaluated by outpatient PT on 11/12/20.  CVA was due to carotid stenosis. She underwent left carotid endartectomy on 7/21. Procedure went well however patient suffered from left cervical hematoma and had subsequent surgical procedure to stop the bleed and remove the hematoma on 11/13/20. They were concerned with her breathing and therefore intubated her for 1 day, she was in ICU for 2 days and transferred to med/surge on 11/15/20. Patient was discharged home on 11/17/20 and is now returning to outpatient PT. She has had the stiches removed and is healing well. Denies any neck pain or stiffness. She lives with her daughter and has 24/7 caregiver support. She is still using RW for  most ambulation. She is able to transfer to bedside commode well and is able to stand short time unsupported. She reports feeling very weak and fatigued. She reports she has been dragging her right foot more. She denies any numbness/tingling; She reports feeling like her legs are wanting to do more but is hesitant because of fear of falling. She is mod I for self care morning routine. She does still receive help with showering. She has been working on increasing her activity but denies any formal exercise.    Limitations Standing;Walking    How long can you sit comfortably? NA    How long can you stand comfortably? 30-60 min with support    How long can you walk comfortably? about 100 feet with RW, still limited to short distances;    Diagnostic tests MRI shows left basal ganglia infarct 10/21/20    Patient Stated Goals "Improve balance and walking and not need RW, get back to independent."    Currently in Pain? No/denies    Pain Onset In the past 7 days                [facility] PT Assessment - 09/11/21 0001       Standardized Balance Assessment   Five times sit to stand comments  23 sec without UE, >15 sec indicates increased risk for falls, slightly more impaired than 08/06/21 which was 15.5 sec    10 Meter Walk 0.4 m/s without AD             TREATMENT: Instructed patient in LE strengthening: Seated with 3# ankle weight: Hip flexion/abduction/adduction (stepping over orange hurdle) when sitting x10 reps each LE, increased difficulty on RLE; completed 2 sets on RLE;  -LAQ 5sec hold x10 reps RLE only;  Placed stepping stone in large circle #5 Instructed patient walking from center of circle out to one of the stones and back in various directions to challenge multi-directional gait. Pt ambulated without AD, required min A for safety especially during turns. She was able to correctly walk to each color called out but does required increased time;   Multi-direction Weaving in circle  around stepping stones x2 laps with min A for safety. Pt exhibits increased RLE foot drag with slower gait speed.   Standing on LLE- stepping in different directions with RLE: Forward, diagonal, side  and posteriorly x5 sets Required CGA to min A, reports moderate difficulty requiring cues for proper sequencing and foot placement;  Patient tolerated session well. She does report increased fatigue at end of session.  Patient's condition has the potential to improve in response to therapy. Maximum improvement is yet to be obtained. The anticipated improvement is attainable and reasonable in a generally predictable time.  Patient reports adherence with HEP. Some outcome measures were assessed, limited full outcome measure assessment as patient reports increased RLE ankle  discomfort. She states that it bothered her a lot yesterday but is not currently hurting her today. Therefore session was limited to tolerance. Pt denies any pain currently or at end of session;                           PT Education - 09/11/21 1241     Education Details gait safety, strengthening;    Person(s) Educated Patient    Methods Explanation;Verbal cues    Comprehension Verbalized understanding;Returned demonstration;Verbal cues required;Need further instruction              PT Short Term Goals - 09/10/21 1354       PT SHORT TERM GOAL #1   Title Patient will be adherent to HEP at least 3x a week to improve functional strength and balance for better safety at home.    Baseline 9/21: Pt reports compliance with HEP and is confident, 1/3: doing exercise 2x a week; 06/25/21: 3x/week, 5/18: consistent;    Time 4    Period Weeks    Status Achieved    Target Date 06/25/21      PT SHORT TERM GOAL #2   Title Patient will be independent in transferring sit<>Stand without pushing on arm rests to improve ability to get up from chair.    Baseline 9/21: pt able to complete STS without use of UEs, 1/3:able  to stand but has moderate difficulty requiring increased time; 06/25/21: able to perform indep without hands but requiring use of momentum to attain standing. Remains stable. 5/18: able to stand without UE assist, close supervision, remains stable;    Time 4    Period Weeks    Status Achieved    Target Date 07/23/21               PT Long Term Goals - 09/10/21 1357       PT LONG TERM GOAL #1   Title Patient (> 65 years old) will complete five times sit to stand test in < 15 seconds indicating an increased LE strength and improved balance.    Baseline 9/21: 29 seconds hands-free 10/10: deferred 10/12: 34 sec hands free; 11/2: 23.8 sec hands-free; 12/7: 33.2 sec hands free, 1/3: 33.92 hands free; 06/25/21: 25.69 sec, 4/13: 15.5 sec hands free, 5/18: 23 sec without UE    Time 8    Period Weeks    Status Partially Met    Target Date 10/20/21      PT LONG TERM GOAL #2   Title Patient will increase six minute walk test distance to >400 feet for improve gait ability    Baseline 9/21: 368 ft with RW; 10/10: deferred 10/12: 408 ft; 11/2: 476 ft with 4WW, 1/3: 475 feet with RW; 4/13: 450 feet without AD CGA    Time 8    Period Weeks    Status Achieved    Target Date 10/20/21      PT LONG TERM GOAL #3   Title Patient will increase 10 meter walk test to >1.52ms as to improve gait speed for better community ambulation and to reduce fall risk.    Baseline 9/21: 0.5 m/s with RW; 10/10 deferred 10/12: 0.93 m/s with 42HC 11/2: 0.83 m/s with 4WW; 12/7: 0.52 m/s with RW, 1/3: 0.704; 06/25/21: 0.55 m/s with SPC. 5/18: 0.4 m/s without AD    Time 8    Period Weeks    Status On-going    Target Date 10/20/21  PT LONG TERM GOAL #4   Title Patient will be independent with ascend/descend 6 steps using single UE in step over step pattern without LOB.    Baseline 9/21:  Patient uses PT stairs (4 steps) with bilateral upper extremity support on handrails for both ascending/descending.  Patient exhibits  reciprocal pattern ascending and step to pattern descending. 10/10: deferred; 10/12: ascending/descending recip. steps with BUE support; 11/2: Ascended/descended 8 steps total, reciprocal pattern ascending and step-to descending. Pt uses UUE support throughout with CGA assist.; 12/7: Ascended and descended 8 steps total with UUE support and CGA. Reciprocal stepping ascending and step to descending., 1/3: Deferrred; 06/25/21: able to perform with SUE support and reciprocal pattern asc/desc, but does require close CGA with descending. 5/18: independent with 2 handrails, close supervision with 1 handrai;    Time 8    Period Weeks    Status Partially Met    Target Date 10/20/21      PT LONG TERM GOAL #5   Title Patient will demonstrate an improved Berg Balance Score of >45/56 as to demonstrate improved balance with ADLs such as sitting/standing and transfer balance and reduced fall risk.    Baseline 9/21: deferred d/t time; 9/29: 42/56; 10/10: deferred; 10/12: 44/56; 11/2: deferred; 11/7: 46/56, 1/3: deferred    Time 8    Period Weeks    Status Achieved    Target Date 10/20/21      PT LONG TERM GOAL #6   Title Patient will improve FOTO score to >60% to indicate improved functional mobility with ADLs.    Baseline 9/21: 59; 10/10: 57; 11/2: 60; 12/7: 58%, 1/3: 61%, 4/13: 56%    Time 8    Period Weeks    Status On-going    Target Date 10/20/21      PT LONG TERM GOAL #7   Title Pt will improve FGA by a minimum of 5 points to indicate clinically significant improvement in reduced risk of falls with walking tasks.    Baseline 06/25/21: 12, 4/13: deferred, 5/18: deferred due to increased ankle discomfort;    Time 8    Period Weeks    Status New    Target Date 10/20/21                   Plan - 09/11/21 1241     Clinical Impression Statement Patient motivated and participated well within session. She does report increased RLE ankle discomfort since yesterday and as a result requested for  therapy to be adjusted to reduce strain on foot/ankle. Patient was instructed in a few outcome measures. She does exhibit decline in sit<>Stand speed, likely from increased ankle discomfort. 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 Patient instructed in dynamic gait/balance activities. She required min A when walking unsupported especially when turning with occasional mis-step. She is able to maintain balance when walking forward but does walk at a slower pace. Patient denies any increase pain with advanced exercise. She would benefit from additional skilled PT Intervention to improve strength, balance and mobility;    Personal Factors and Comorbidities Age    Comorbidities HTN, CKD, fall risk, type 2 diabetes,    Examination-Activity Limitations Lift;Locomotion Level;Squat;Stairs;Stand;Toileting;Transfers;Bathing    Examination-Participation Restrictions Cleaning;Community Activity;Laundry;Meal Prep;Shop;Volunteer;Driving    Stability/Clinical Decision Making Stable/Uncomplicated    Rehab Potential Fair    PT Frequency 2x / week    PT Duration 8 weeks    PT Treatment/Interventions Cryotherapy;Electrical Stimulation;Moist Heat;Gait training;Stair training;Functional mobility training;Therapeutic activities;Therapeutic  exercise;Neuromuscular re-education;Balance training;Patient/family education;Orthotic Fit/Training;Energy conservation    PT Next Visit Plan work on LE strengthening and balance; gait training, endurance, continue POC as previously indicated    PT Home Exercise Plan Access Code: [email or web address]; no updates    Consulted and Agree with Plan of Care Patient             Patient will benefit from skilled therapeutic intervention in order to improve the following deficits and impairments:  Abnormal gait, Decreased balance, Decreased endurance, Decreased mobility, Difficulty walking, Cardiopulmonary status limiting activity, Decreased activity tolerance,  Decreased coordination, Decreased strength  Visit Diagnosis: Muscle weakness (generalized)  Other lack of coordination  Difficulty in walking, not elsewhere classified  Unsteadiness on feet  Other abnormalities of gait and mobility  Apraxia  Abnormality of gait and mobility     Problem List Patient Active Problem List   Diagnosis Date Noted   Carotid stenosis, symptomatic, with infarction ([place]) 11/13/2020   Gout flare 11/10/2020   UTI (urinary tract infection) 11/10/2020   Left carotid artery stenosis 11/10/2020   Chronic kidney disease 11/10/2020   Left basal ganglia embolic stroke ([place]) 16/96/7893   PAC (premature atrial contraction)    Pre-procedural cardiovascular examination    Ischemic stroke ([place]) 10/20/2020   TIA (transient ischemic attack) 10/19/2020   Right hemiparesis ([place]) 10/19/2020   Type 2 diabetes mellitus with stage 3 chronic kidney disease, without long-term current use of insulin ([place]) 08/24/2018   Hyperlipidemia, unspecified 07/19/2017   Hypertension 07/19/2017   PUD (peptic ulcer disease) 07/19/2017   Type 2 diabetes mellitus ([place]) 07/19/2017   Personal history of gout 08/12/2016   Anemia, unspecified 04/09/2016   Hypothyroidism, acquired 12/10/2015    Yarnell Arvidson, PT, DPT 09/11/2021, 12:54 PM  [place] MAIN [facility] SERVICES [address] [place], [place], [address] Phone: [phone]   Fax:  [phone]  Name: Synethia Endicott MRN: [id number] Date of Birth: Dec 14, 1928

## 2021-09-10 NOTE — Therapy (Signed)
[place] MAIN [facility] SERVICES [address] [place], [place], [address] Phone: [phone]   Fax:  [phone]  Occupational Therapy Treatment  Patient Details  Name: Cassandra Schaefer MRN: [id number] Date of Birth: July 08, 1928 Referring Provider (OT): Dr. Tracie Harrier   Encounter Date: 09/10/2021   OT End of Session - 09/10/21 1331     Visit Number 72    Number of Visits 38    Date for OT Re-Evaluation 10/06/21    Authorization Time Period Reporting period starting 06/18/2020    OT Start Time 1300    OT Stop Time 1345    OT Time Calculation (min) 45 min    Activity Tolerance Patient tolerated treatment well    Behavior During Therapy WFL for tasks assessed/performed             Past Medical History:  Diagnosis Date   Anemia    Cough    RESOLVING, NO FEVER   Diabetes mellitus without complication ([place])    Gout    Hypertension    Hypothyroidism    PUD (peptic ulcer disease)    Stroke [facility])    Wears dentures    full upper, partial lower    Past Surgical History:  Procedure Laterality Date   AMPUTATION TOE Left 12/14/2017   Procedure: AMPUTATION TOE-4TH MPJ;  Surgeon: Samara Deist, DPM;  Location: [place];  Service: Podiatry;  Laterality: Left;  IVA LOCAL Diabetic - oral meds   APPENDECTOMY     CATARACT EXTRACTION W/PHACO Right 06/23/2015   Procedure: CATARACT EXTRACTION PHACO AND INTRAOCULAR LENS PLACEMENT (IOC);  Surgeon: Estill Cotta, MD;  Location: [facility];  Service: Ophthalmology;  Laterality: Right;  [place] 01:28 AP% 23.4 CDE 36.14 fluid pack lot # [id number]   CATARACT EXTRACTION W/PHACO Left 07/28/2015   Procedure: CATARACT EXTRACTION PHACO AND INTRAOCULAR LENS PLACEMENT (IOC);  Surgeon: Estill Cotta, MD;  Location: [facility];  Service: Ophthalmology;  Laterality: Left;  [place]    1:29.7 AP%  24.9 CDE   41.32 fluid casette lot [id number] H exp 09/23/2016   COONOSCOPY     AND ENDOSCOPY   DILATION AND  CURETTAGE OF UTERUS     ENDARTERECTOMY Left 11/13/2020   Procedure: Evacuation of left neck hematoma;  Surgeon: Katha Cabal, MD;  Location: [facility];  Service: Vascular;  Laterality: Left;   ENDARTERECTOMY Left 11/13/2020   Procedure: ENDARTERECTOMY CAROTID;  Surgeon: Algernon Huxley, MD;  Location: [facility];  Service: Vascular;  Laterality: Left;   TONSILLECTOMY     TUBAL LIGATION      There were no vitals filed for this visit.   Subjective Assessment - 09/10/21 1331     Subjective  Pt. reports doing well today.    Patient is accompanied by: Family member    Pertinent History R ankle pain, gout, T2DM, HTN, CKDIII; R/L carotid endarectomy    Currently in Pain? No/denies            OT TREATMENT     Therapeutic Ex.:   Pt. worked on pinch strengthening in the right hand for lateral, and 3pt. pinch using yellow, red, green, and blue resistive clips. Pt. worked on placing the clips at various horizontal angles, with emphasis placed on precision, and accuracy with reaching the target. Pt. worked on translatory movements moving the clips form the lateral pinch position to the 3pt. pinch position. Visual, tactile and verbal cues were required for eliciting the desired movement.    Neuro muscular  re-education:   Pt. worked on right hand [facility] skills grasping small earring backings from a small container with her 2nd digit, and thumb, placing them on the flat, tabletop surface, then removing them from the tabletop one at a time. Pt. worked on placing them onto a small resistive dowel.    Pt. continues to make make progress with the accuracy of reaching for targets, and has been using her hand more during tasks at home. Pt. Has difficulty with translatory movements moving the objects through her hand. Pt. continues to more accurately be able to manipulate small objects form confined spaces. Pt. continues to have difficulty with performing translatroy movements, and requires visual cues for  motor planning through the moving the clips from one position to the next. Pt. continues to present with weakness, and decreased [facility] skills in order to improve UE functioning during ADL, and IADL skills.                            OT Education - 09/10/21 1331     Education Details RUE functioning.    Person(s) Educated Patient    Methods Explanation;Verbal cues;Demonstration;Tactile cues    Comprehension Verbalized understanding;Verbal cues required;Returned demonstration              OT Short Term Goals - 03/23/21 1657       OT SHORT TERM GOAL #1   Title Pt will perform HEP for RUE strength/coordination independently.    Baseline Eval: HEP not yet established in outpatient, but pt is working with putty from [name]; 12/04/2020; [facility] HEP initiated this day with mod vc after return demo; 01/05/2021: indep with currently program, but ongoing as pt progresses; 01/28/2021: vc to revisit and focus on grip and pinch strengthening with putty; 03/23/21: pt is indep with HEP    Time 6    Period Weeks    Status Achieved    Target Date 03/23/21               OT Long Term Goals - 09/03/21 1313       OT LONG TERM GOAL #1   Title Pt will improve hand writing to 100% legibility with R hand to be able to independently write a check.    Baseline Eval: signature is 75% legible with R hand, requiring extra time; 12/04/2020: no chan to complete.ge from eval; 01/05/2021: Printing is 100% legible, signature is 90% legible, but still requires extra time and effort.; 01/26/21: Signature is 90% legible and speed/fluidity have improved. 20th visit: 90% legible, 03/23/21: 90% legible, extra time 40th visit 90 % legibility with increased time, 50th visit: 90% legible with with increased time required 07/14/2021: 90% legibility. 07/30/2021: 90% legibility, pt. presses too hard through the pen, and pencil. 09/03/2021: 90% legibility    Time 12    Period Weeks    Status On-going    Target Date  10/06/21      OT LONG TERM GOAL #2   Title Pt will improve R hand coordination to enable good manipulation of iphone using R dominant hand      OT LONG TERM GOAL #3   Title Pt will improve GMC throughout RUE to enable pt to reach for and pick up ADL supplies with R dominant hand without dropping or knocking over objects.    Baseline Eval: Pt reports RUE is clumsy and easily knocks over objects when trying to reach for ADL supplies.  Pt verbalizes that  her "aim is off."; 12/04/2020: pt reports slight improvement since eval and has started using her R hand to eat, but still feels clumsy; 01/05/2021: Greatly improved; pt is using silverware to eat with R hand, can comb hair with RUE, still mildly ataxic; 01/26/21: pt reports difficulty picking up objects within a small space (ie; may knock the toothpaste holder down when reaching for toothbrush), but reaching for light/larger objects is improving 20th visit: pt. continues to be mildly ataxic, however is consistently improving with motor control, and [place] while reaching targets; 03/23/21: Pt reaches with accuracy towards targets if she takes her time (pt drops and knocks things over when moving at a faster/normal pace) 04/23/2021: Pt. continues to present with difficulty with depth perception, and impaired Brown.40th visit: Pt. is improving, however tends to knock over lighter weight objects.50th visit: Pt. has improved with reaching for items without knocking them over, however continues to need work on accuracy.07/14/2021: Pt. is improving while motor control, and accuracy of reaching for objects. Pt. continues to work towards improving efficiency with reaching for objects. 07/30/2021: Pt. is able to able to reach for objects more accurately, however continues to present with limited motor control in the Right. 09/03/2021: Pt. Pt. is improving with reaching up without knocking items over. pt. is now able to stand at the sink and reach up without dropping items.    Time  12    Period Weeks    Status On-going    Target Date 10/06/21      OT LONG TERM GOAL #4   Title Pt will improve FOTO score to 68 or better to indicate measurable functional improvement.    Baseline Eval: FOTO 55; 01/05/2021: FOTO 61; 01/28/21: FOTO 61, 20th visit: 61; 03/23/21: FOTO 60 4oth visit: FOTO score: 61, 50th visit: FOTO 69 07/30/2021: FOTO 71  09/03/2021: FOTO 73    Time 12    Period Weeks    Status On-going    Target Date 10/06/21      OT LONG TERM GOAL #5   Time 12      OT LONG TERM GOAL #6   Title Pt. will improve right hand [facility] skills to be able to indepedendently manipulate and set-up her medication/pills in the pillbox.    Baseline 07/14/2021: Pt. has difficulty manipulating small pills, and often drops them. 07/30/2021: Pt. continues to have difficulty grasping pills/medication for set-up of pillbox. 09/03/2021: Pt. is able to grasp pills from the pillbox without dropping them.    Time 12    Period Weeks    Status New    Target Date 10/06/21                   Plan - 09/10/21 1332     Clinical Impression Statement Pt. continues to make make progress with the accuracy of reaching for targets, and has been using her hand more during tasks at home. Pt. Has difficulty with translatory movements moving the objects through her hand. Pt. continues to more accurately be able to manipulate small objects form confined spaces. Pt. continues to have difficulty with performing translatroy movements, and requires visual cues for motor planning through the moving the clips from one position to the next. Pt. continues to present with weakness, and decreased [facility] skills in order to improve UE functioning during ADL, and IADL skills.       OT Occupational Profile and History Detailed Assessment- Review of Records and additional review of physical, cognitive, psychosocial history related to  current functional performance    Occupational performance deficits (Please refer to evaluation  for details): ADL's;Leisure;IADL's    Body Structure / Function / Physical Skills ADL;Coordination;Endurance;GMC;UE functional use;Balance;Sensation;Body mechanics;IADL;Pain;Dexterity;FMC;Strength;Gait;Mobility    Rehab Potential Good    Clinical Decision Making Several treatment options, min-mod task modification necessary    Comorbidities Affecting Occupational Performance: May have comorbidities impacting occupational performance    Modification or Assistance to Complete Evaluation  Min-Moderate modification of tasks or assist with assess necessary to complete eval    OT Frequency 2x / week    OT Duration 12 weeks    OT Treatment/Interventions Self-care/ADL training;Therapeutic exercise;DME and/or AE instruction;Functional Mobility Training;Balance training;Neuromuscular education;Therapeutic activities;Patient/family education    Consulted and Agree with Plan of Care Patient             Patient will benefit from skilled therapeutic intervention in order to improve the following deficits and impairments:   Body Structure / Function / Physical Skills: ADL, Coordination, Endurance, GMC, UE functional use, Balance, Sensation, Body mechanics, IADL, Pain, Dexterity, FMC, Strength, Gait, Mobility       Visit Diagnosis: Muscle weakness (generalized)  Other lack of coordination    Problem List Patient Active Problem List   Diagnosis Date Noted   Carotid stenosis, symptomatic, with infarction ([place]) 11/13/2020   Gout flare 11/10/2020   UTI (urinary tract infection) 11/10/2020   Left carotid artery stenosis 11/10/2020   Chronic kidney disease 11/10/2020   Left basal ganglia embolic stroke ([place]) [id number]   PAC (premature atrial contraction)    Pre-procedural cardiovascular examination    Ischemic stroke ([place]) 10/20/2020   TIA (transient ischemic attack) 10/19/2020   Right hemiparesis ([place]) 10/19/2020   Type 2 diabetes mellitus with stage 3 chronic kidney disease, without  long-term current use of insulin ([place]) 08/24/2018   Hyperlipidemia, unspecified 07/19/2017   Hypertension 07/19/2017   PUD (peptic ulcer disease) 07/19/2017   Type 2 diabetes mellitus ([place]) 07/19/2017   Personal history of gout 08/12/2016   Anemia, unspecified 04/09/2016   Hypothyroidism, acquired 12/10/2015   Harrel Carina, MS, OTR/L   Harrel Carina, OT 09/10/2021, 1:35 PM  [facility] MAIN [facility] SERVICES [address] [place], [place], [address] Phone: [phone]   Fax:  [phone]  Name: Melady Askew MRN: [id number] Date of Birth: March 10, 1929

## 2021-09-15 ENCOUNTER — Encounter: Payer: Self-pay | Admitting: Physical Therapy

## 2021-09-15 ENCOUNTER — Ambulatory Visit: Payer: Medicare HMO | Admitting: Physical Therapy

## 2021-09-15 ENCOUNTER — Ambulatory Visit: Payer: Medicare HMO | Admitting: Occupational Therapy

## 2021-09-15 DIAGNOSIS — M6281 Muscle weakness (generalized): Secondary | ICD-10-CM

## 2021-09-15 DIAGNOSIS — R269 Unspecified abnormalities of gait and mobility: Secondary | ICD-10-CM

## 2021-09-15 DIAGNOSIS — R278 Other lack of coordination: Secondary | ICD-10-CM

## 2021-09-15 DIAGNOSIS — R262 Difficulty in walking, not elsewhere classified: Secondary | ICD-10-CM

## 2021-09-15 DIAGNOSIS — R2689 Other abnormalities of gait and mobility: Secondary | ICD-10-CM

## 2021-09-15 DIAGNOSIS — R2681 Unsteadiness on feet: Secondary | ICD-10-CM

## 2021-09-15 DIAGNOSIS — R482 Apraxia: Secondary | ICD-10-CM

## 2021-09-15 NOTE — Therapy (Unsigned)
[place] [facility] SERVICES [place], [place], [address] Phone: [phone]   Fax:  [phone]  Physical Therapy Treatment  Patient Details  Name: Cassandra Schaefer MRN: [id number] Date of Birth: December 07, 1928 Referring Provider (PT): Dr. Dew/Dr. Ginette Pitman PCP   Encounter Date: 09/15/2021   PT End of Session - 09/15/21 1356     Visit Number [id number]    Number of Visits 1    Date for PT Re-Evaluation 10/20/21    Authorization Type [employer]    PT Start Time 1348    PT Stop Time 1430    PT Time Calculation (min) 42 min    Equipment Utilized During Treatment Gait belt    Activity Tolerance Patient tolerated treatment well    Behavior During Therapy WFL for tasks assessed/performed             Past Medical History:  Diagnosis Date   Anemia    Cough    RESOLVING, NO FEVER   Diabetes mellitus without complication ([place])    Gout    Hypertension    Hypothyroidism    PUD (peptic ulcer disease)    Stroke [facility])    Wears dentures    full upper, partial lower    Past Surgical History:  Procedure Laterality Date   AMPUTATION TOE Left 12/14/2017   Procedure: AMPUTATION TOE-4TH MPJ;  Surgeon: Samara Deist, DPM;  Location: [place];  Service: Podiatry;  Laterality: Left;  IVA LOCAL Diabetic - oral meds   APPENDECTOMY     CATARACT EXTRACTION W/PHACO Right 06/23/2015   Procedure: CATARACT EXTRACTION PHACO AND INTRAOCULAR LENS PLACEMENT (IOC);  Surgeon: Estill Cotta, MD;  Location: [facility];  Service: Ophthalmology;  Laterality: Right;  [place] 01:28 AP% 23.4 CDE 36.14 fluid pack lot # [id number]   CATARACT EXTRACTION W/PHACO Left 07/28/2015   Procedure: CATARACT EXTRACTION PHACO AND INTRAOCULAR LENS PLACEMENT (IOC);  Surgeon: Estill Cotta, MD;  Location: [facility];  Service: Ophthalmology;  Laterality: Left;  [place]    1:29.7 AP%  24.9 CDE   41.32 fluid casette lot #[id number] H exp 09/23/2016   COONOSCOPY     AND ENDOSCOPY    DILATION AND CURETTAGE OF UTERUS     ENDARTERECTOMY Left 11/13/2020   Procedure: Evacuation of left neck hematoma;  Surgeon: Katha Cabal, MD;  Location: [facility];  Service: Vascular;  Laterality: Left;   ENDARTERECTOMY Left 11/13/2020   Procedure: ENDARTERECTOMY CAROTID;  Surgeon: Algernon Huxley, MD;  Location: [facility];  Service: Vascular;  Laterality: Left;   TONSILLECTOMY     TUBAL LIGATION      There were no vitals filed for this visit.   Subjective Assessment - 09/15/21 1355     Subjective Pt reports being a little tired. She states her granddaughter is in town with the great granddaughter. She is having her birthday celebration next weekend; Denies any ankle discomfort;    Patient is accompained by: --   Daughter, [name]   Pertinent History 88 y.o. female with medical history significant for type 2 diabetes mellitus, essential hypertension, acquired hypothyroidism, hyperlipidemia, stage III chronic kidney disease reports increased right sided weakness on 10/19/20. She was diagnosed with left basal ganglia ischemic stroke. She was discharged to inpatient rehab for 10 days and then discharged home. Patient was evaluated by outpatient PT on 11/12/20. CVA was due to carotid stenosis. She underwent left carotid endartectomy on 7/21. Procedure went well however patient suffered from left cervical hematoma and had  subsequent surgical procedure to stop the bleed and remove the hematoma on 11/13/20. They were concerned with her breathing and therefore intubated her for 1 day, she was in ICU for 2 days and transferred to med/surge on 11/15/20. Patient was discharged home on 11/17/20 and is now returning to outpatient PT. She has had the stiches removed and is healing well. Denies any neck pain or stiffness. She lives with her daughter and has 24/7 caregiver support. She is still using RW for most ambulation. She is able to transfer to bedside commode well and is able to stand short time unsupported. She  reports feeling very weak and fatigued. She reports she has been dragging her right foot more. She denies any numbness/tingling; She reports feeling like her legs are wanting to do more but is hesitant because of fear of falling. She is mod I for self care morning routine. She does still receive help with showering. She has been working on increasing her activity but denies any formal exercise.    Limitations Standing;Walking    How long can you sit comfortably? NA    How long can you stand comfortably? 30-60 min with support    How long can you walk comfortably? about 100 feet with RW, still limited to short distances;    Diagnostic tests MRI shows left basal ganglia infarct 10/21/20    Patient Stated Goals "Improve balance and walking and not need RW, get back to independent."    Currently in Pain? No/denies    Pain Onset In the past 7 days                   TREATMENT: Warm up walking on firm surface x180 feet x1 laps with min A for safety;   NMR Walking in hallway: Forward/backward walking x20 feet x2 laps each, required min A with posterior walking with increased time/effort, decreased motor control with posterior walking; Forward walking with high knee march x20 feet, min a for safety, moderate difficulty;   Stepping over 1/2 Bolster:  Forward/backward x10 reps unsupported with min A for safety;  Stepping with single leg- look side/side then step backward x3 reps each foot in front with moderate difficulty reported;    Standing on 1/2 bolster: (Round side up): -heel raises x15 reps -toe raises x15 reps Pt required CGA and cues for increased ROM for better ankle strengthening  Standing on 1/2 bolster:  (Flat side up) Feet apart:  Heel/toe rocks x10 reps with intermittent rail assist;              Unsupported standing 30 sec hold x1 reps             Intermittent rail assist, mini squat x10 reps;  Pt does exhibit decreased ankle strategies, requiring min A for balance  control exhibits forward/backward loss of balance;   Instructed patient in obstacle course: Weaving around #4 cones, x4 sets with CGA to close supervision, does require cues to increase step length and increase ankle DF at heel strike for better foot clearance;      Tolerated session well. Reports fatigue but denies any soreness or pain;                                        PT Education - 09/15/21 1356     Education Details gait safety/strengthening;    Person(s) Educated Patient  Methods Explanation;Verbal cues    Comprehension Verbalized understanding;Returned demonstration;Verbal cues required;Need further instruction              PT Short Term Goals - 09/10/21 1354       PT SHORT TERM GOAL #1   Title Patient will be adherent to HEP at least 3x a week to improve functional strength and balance for better safety at home.    Baseline 9/21: Pt reports compliance with HEP and is confident, 1/3: doing exercise 2x a week; 06/25/21: 3x/week, 5/18: consistent;    Time 4    Period Weeks    Status Achieved    Target Date 06/25/21      PT SHORT TERM GOAL #2   Title Patient will be independent in transferring sit<>Stand without pushing on arm rests to improve ability to get up from chair.    Baseline 9/21: pt able to complete STS without use of UEs, 1/3:able to stand but has moderate difficulty requiring increased time; 06/25/21: able to perform indep without hands but requiring use of momentum to attain standing. Remains stable. 5/18: able to stand without UE assist, close supervision, remains stable;    Time 4    Period Weeks    Status Achieved    Target Date 07/23/21               PT Long Term Goals - 09/10/21 1357       PT LONG TERM GOAL #1   Title Patient (> 86 years old) will complete five times sit to stand test in < 15 seconds indicating an increased LE strength and improved balance.    Baseline 9/21: 29 seconds hands-free 10/10:  deferred 10/12: 34 sec hands free; 11/2: 23.8 sec hands-free; 12/7: 33.2 sec hands free, 1/3: 33.92 hands free; 06/25/21: 25.69 sec, 4/13: 15.5 sec hands free, 5/18: 23 sec without UE    Time 8    Period Weeks    Status Partially Met    Target Date 10/20/21      PT LONG TERM GOAL #2   Title Patient will increase six minute walk test distance to >400 feet for improve gait ability    Baseline 9/21: 368 ft with RW; 10/10: deferred 10/12: 408 ft; 11/2: 476 ft with 4WW, 1/3: 475 feet with RW; 4/13: 450 feet without AD CGA    Time 8    Period Weeks    Status Achieved    Target Date 10/20/21      PT LONG TERM GOAL #3   Title Patient will increase 10 meter walk test to >1.17ms as to improve gait speed for better community ambulation and to reduce fall risk.    Baseline 9/21: 0.5 m/s with RW; 10/10 deferred 10/12: 0.93 m/s with 48JG 11/2: 0.83 m/s with 48TL 12/7: 0.52 m/s with RW, 1/3: 0.704; 06/25/21: 0.55 m/s with SPC. 5/18: 0.4 m/s without AD    Time 8    Period Weeks    Status On-going    Target Date 10/20/21      PT LONG TERM GOAL #4   Title Patient will be independent with ascend/descend 6 steps using single UE in step over step pattern without LOB.    Baseline 9/21:  Patient uses PT stairs (4 steps) with bilateral upper extremity support on handrails for both ascending/descending.  Patient exhibits reciprocal pattern ascending and step to pattern descending. 10/10: deferred; 10/12: ascending/descending recip. steps with BUE support; 11/2: Ascended/descended 8 steps total, reciprocal pattern ascending and  step-to descending. Pt uses UUE support throughout with CGA assist.; 12/7: Ascended and descended 8 steps total with UUE support and CGA. Reciprocal stepping ascending and step to descending., 1/3: Deferrred; 06/25/21: able to perform with SUE support and reciprocal pattern asc/desc, but does require close CGA with descending. 5/18: independent with 2 handrails, close supervision with 1 handrai;     Time 8    Period Weeks    Status Partially Met    Target Date 10/20/21      PT LONG TERM GOAL #5   Title Patient will demonstrate an improved Berg Balance Score of >45/56 as to demonstrate improved balance with ADLs such as sitting/standing and transfer balance and reduced fall risk.    Baseline 9/21: deferred d/t time; 9/29: 42/56; 10/10: deferred; 10/12: 44/56; 11/2: deferred; 11/7: 46/56, 1/3: deferred    Time 8    Period Weeks    Status Achieved    Target Date 10/20/21      PT LONG TERM GOAL #6   Title Patient will improve FOTO score to >60% to indicate improved functional mobility with ADLs.    Baseline 9/21: 59; 10/10: 57; 11/2: 60; 12/7: 58%, 1/3: 61%, 4/13: 56%    Time 8    Period Weeks    Status On-going    Target Date 10/20/21      PT LONG TERM GOAL #7   Title Pt will improve FGA by a minimum of 5 points to indicate clinically significant improvement in reduced risk of falls with walking tasks.    Baseline 06/25/21: 12, 4/13: deferred, 5/18: deferred due to increased ankle discomfort;    Time 8    Period Weeks    Status New    Target Date 10/20/21                   Plan - 09/16/21 0836     Clinical Impression Statement Patient motivated and participated well within session. She was instructed in advanced gait tasks with forward/backward walking. She has significant difficulty with backward walking with uneven cadence/step length. Patient continues to exhibit RLE foot drop especially with fatigue. Patient does exhibit RLE apraxia with decreased motor control when negotiating obstacles. Progressed balance exercise utilizing compliant surface. Patient does require cues for increased step length for better foot clearance. She does fatigue with advanced exercise. She would benefit from additional skilled PT Intervention to improve strength, balance and mobility;    Personal Factors and Comorbidities Age    Comorbidities HTN, CKD, fall risk, type 2 diabetes,     Examination-Activity Limitations Lift;Locomotion Level;Squat;Stairs;Stand;Toileting;Transfers;Bathing    Examination-Participation Restrictions Cleaning;Community Activity;Laundry;Meal Prep;Shop;Volunteer;Driving    Stability/Clinical Decision Making Stable/Uncomplicated    Rehab Potential Fair    PT Frequency 2x / week    PT Duration 8 weeks    PT Treatment/Interventions Cryotherapy;Electrical Stimulation;Moist Heat;Gait training;Stair training;Functional mobility training;Therapeutic activities;Therapeutic exercise;Neuromuscular re-education;Balance training;Patient/family education;Orthotic Fit/Training;Energy conservation    PT Next Visit Plan work on LE strengthening and balance; gait training, endurance, continue POC as previously indicated    PT Home Exercise Plan Access Code: [email or web address]; no updates    Consulted and Agree with Plan of Care Patient             Patient will benefit from skilled therapeutic intervention in order to improve the following deficits and impairments:  Abnormal gait, Decreased balance, Decreased endurance, Decreased mobility, Difficulty walking, Cardiopulmonary status limiting activity, Decreased activity tolerance, Decreased coordination, Decreased strength  Visit Diagnosis: Muscle weakness (generalized)  Other  lack of coordination  Difficulty in walking, not elsewhere classified  Unsteadiness on feet  Other abnormalities of gait and mobility  Apraxia  Abnormality of gait and mobility     Problem List Patient Active Problem List   Diagnosis Date Noted   Carotid stenosis, symptomatic, with infarction ([place]) 11/13/2020   Gout flare 11/10/2020   UTI (urinary tract infection) 11/10/2020   Left carotid artery stenosis 11/10/2020   Chronic kidney disease 11/10/2020   Left basal ganglia embolic stroke ([place]) 08/56/9437   PAC (premature atrial contraction)    Pre-procedural cardiovascular examination    Ischemic stroke ([place]) 10/20/2020   TIA  (transient ischemic attack) 10/19/2020   Right hemiparesis ([place]) 10/19/2020   Type 2 diabetes mellitus with stage 3 chronic kidney disease, without long-term current use of insulin ([place]) 08/24/2018   Hyperlipidemia, unspecified 07/19/2017   Hypertension 07/19/2017   PUD (peptic ulcer disease) 07/19/2017   Type 2 diabetes mellitus ([place]) 07/19/2017   Personal history of gout 08/12/2016   Anemia, unspecified 04/09/2016   Hypothyroidism, acquired 12/10/2015    Jerzie Bieri, PT, DPT 09/16/2021, 8:41 AM  [place] [address] [place], [place], [address] Phone: [phone]   Fax:  [phone]  Name: Lalanya Rufener MRN: [id number] Date of Birth: [birth year]

## 2021-09-17 ENCOUNTER — Ambulatory Visit: Payer: Medicare HMO | Admitting: Occupational Therapy

## 2021-09-17 ENCOUNTER — Encounter: Payer: Self-pay | Admitting: Occupational Therapy

## 2021-09-17 ENCOUNTER — Ambulatory Visit: Payer: Medicare HMO | Admitting: Physical Therapy

## 2021-09-17 ENCOUNTER — Encounter: Payer: Self-pay | Admitting: Physical Therapy

## 2021-09-17 DIAGNOSIS — R278 Other lack of coordination: Secondary | ICD-10-CM

## 2021-09-17 DIAGNOSIS — R262 Difficulty in walking, not elsewhere classified: Secondary | ICD-10-CM

## 2021-09-17 DIAGNOSIS — M6281 Muscle weakness (generalized): Secondary | ICD-10-CM | POA: Diagnosis not present

## 2021-09-17 DIAGNOSIS — R269 Unspecified abnormalities of gait and mobility: Secondary | ICD-10-CM

## 2021-09-17 DIAGNOSIS — R2689 Other abnormalities of gait and mobility: Secondary | ICD-10-CM

## 2021-09-17 DIAGNOSIS — R482 Apraxia: Secondary | ICD-10-CM

## 2021-09-17 DIAGNOSIS — R2681 Unsteadiness on feet: Secondary | ICD-10-CM

## 2021-09-17 NOTE — Therapy (Signed)
[place] MAIN [facility] SERVICES [place], [place], [address] Phone: [phone]   Fax:  [phone]  Physical Therapy Treatment  Patient Details  Name: Cassandra Schaefer MRN: [id number] Date of Birth: 1928-08-27 Referring Provider (PT): Dr. Dew/Dr. Ginette Pitman PCP   Encounter Date: 09/17/2021   PT End of Session - 09/17/21 1352     Visit Number 72    Number of Visits 101    Date for PT Re-Evaluation 10/20/21    Authorization Type [employer]    PT Start Time 1350    PT Stop Time 1430    PT Time Calculation (min) 40 min    Equipment Utilized During Treatment Gait belt    Activity Tolerance Patient tolerated treatment well    Behavior During Therapy WFL for tasks assessed/performed             Past Medical History:  Diagnosis Date   Anemia    Cough    RESOLVING, NO FEVER   Diabetes mellitus without complication ([place])    Gout    Hypertension    Hypothyroidism    PUD (peptic ulcer disease)    Stroke [facility])    Wears dentures    full upper, partial lower    Past Surgical History:  Procedure Laterality Date   AMPUTATION TOE Left 12/14/2017   Procedure: AMPUTATION TOE-4TH MPJ;  Surgeon: Samara Deist, DPM;  Location: [facility];  Service: Podiatry;  Laterality: Left;  IVA LOCAL Diabetic - oral meds   APPENDECTOMY     CATARACT EXTRACTION W/PHACO Right 06/23/2015   Procedure: CATARACT EXTRACTION PHACO AND INTRAOCULAR LENS PLACEMENT (IOC);  Surgeon: Estill Cotta, MD;  Location: [facility];  Service: Ophthalmology;  Laterality: Right;  [place] 01:28 AP% 23.4 CDE 36.14 fluid pack lot # [id number]   CATARACT EXTRACTION W/PHACO Left 07/28/2015   Procedure: CATARACT EXTRACTION PHACO AND INTRAOCULAR LENS PLACEMENT (IOC);  Surgeon: Estill Cotta, MD;  Location: [facility];  Service: Ophthalmology;  Laterality: Left;  [place]    1:29.7 AP%  24.9 CDE   41.32 fluid casette lot #[id number] H exp 09/23/2016   COONOSCOPY     AND ENDOSCOPY    DILATION AND CURETTAGE OF UTERUS     ENDARTERECTOMY Left 11/13/2020   Procedure: Evacuation of left neck hematoma;  Surgeon: Katha Cabal, MD;  Location: [facility];  Service: Vascular;  Laterality: Left;   ENDARTERECTOMY Left 11/13/2020   Procedure: ENDARTERECTOMY CAROTID;  Surgeon: Algernon Huxley, MD;  Location: [facility];  Service: Vascular;  Laterality: Left;   TONSILLECTOMY     TUBAL LIGATION      There were no vitals filed for this visit.   Subjective Assessment - 09/17/21 1353     Subjective Pt reports getting a UTI; She is going to start antibiotics today. She reports no other new changes;    Patient is accompained by: --   Daughter, [name]   Pertinent History 88 y.o. female with medical history significant for type 2 diabetes mellitus, essential hypertension, acquired hypothyroidism, hyperlipidemia, stage III chronic kidney disease reports increased right sided weakness on 10/19/20. She was diagnosed with left basal ganglia ischemic stroke. She was discharged to inpatient rehab for 10 days and then discharged home. Patient was evaluated by outpatient PT on 11/12/20. CVA was due to carotid stenosis. She underwent left carotid endartectomy on 7/21. Procedure went well however patient suffered from left cervical hematoma and had subsequent surgical procedure to stop the bleed and remove the hematoma  on 11/13/20. They were concerned with her breathing and therefore intubated her for 1 day, she was in ICU for 2 days and transferred to med/surge on 11/15/20. Patient was discharged home on 11/17/20 and is now returning to outpatient PT. She has had the stiches removed and is healing well. Denies any neck pain or stiffness. She lives with her daughter and has 24/7 caregiver support. She is still using RW for most ambulation. She is able to transfer to bedside commode well and is able to stand short time unsupported. She reports feeling very weak and fatigued. She reports she has been dragging her right  foot more. She denies any numbness/tingling; She reports feeling like her legs are wanting to do more but is hesitant because of fear of falling. She is mod I for self care morning routine. She does still receive help with showering. She has been working on increasing her activity but denies any formal exercise.    Limitations Standing;Walking    How long can you sit comfortably? NA    How long can you stand comfortably? 30-60 min with support    How long can you walk comfortably? about 100 feet with RW, still limited to short distances;    Diagnostic tests MRI shows left basal ganglia infarct 10/21/20    Patient Stated Goals "Improve balance and walking and not need RW, get back to independent."    Currently in Pain? No/denies    Pain Onset In the past 7 days                  TREATMENT: Warm up walking on firm surface x180 feet x1 laps with min A for safety;   Exercise: Forward step ups on 6 inch step with 2-1 rail assist x10 reps each LE;    NMR Walking on red mat:  Forward/backward walking x20 feet x2 laps each, required min A with posterior walking with increased time/effort, decreased motor control with posterior walking; Forward walking with high knee march x20 feet, min a for safety, moderate difficulty;    Stepping airex to airex pad:              Forward/backward x10 reps with 1 rail assist with min A for safety; Forward lunges on airex pad x5 reps each LE with 1 rail assist for safety;      Standing on 1/2 bolster: (Round side up): -heel raises x15 reps -toe raises x15 reps Pt required CGA and cues for increased ROM for better ankle strengthening    Walking on red mat to challenge dynamic balance: Forward/backward walking x5 laps Side stepping x2 laps Pt unsupported, required CGA to min A for safety; Pt able to exhibit less right foot drag compared to previous sessions;   Instructed patient in obstacle course: Weaving around #5 cones, x2 sets with CGA to close  supervision, does require cues to increase step length and increase ankle DF at heel strike for better foot clearance;       Tolerated session well. Reports minimal  fatigue but denies any soreness or pain;                                 PT Education - 09/17/21 1352     Education Details gait safety/positioning;    Person(s) Educated Patient    Methods Explanation;Verbal cues    Comprehension Verbalized understanding;Returned demonstration;Verbal cues required;Need further instruction  PT Short Term Goals - 09/10/21 1354       PT SHORT TERM GOAL #1   Title Patient will be adherent to HEP at least 3x a week to improve functional strength and balance for better safety at home.    Baseline 9/21: Pt reports compliance with HEP and is confident, 1/3: doing exercise 2x a week; 06/25/21: 3x/week, 5/18: consistent;    Time 4    Period Weeks    Status Achieved    Target Date 06/25/21      PT SHORT TERM GOAL #2   Title Patient will be independent in transferring sit<>Stand without pushing on arm rests to improve ability to get up from chair.    Baseline 9/21: pt able to complete STS without use of UEs, 1/3:able to stand but has moderate difficulty requiring increased time; 06/25/21: able to perform indep without hands but requiring use of momentum to attain standing. Remains stable. 5/18: able to stand without UE assist, close supervision, remains stable;    Time 4    Period Weeks    Status Achieved    Target Date 07/23/21               PT Long Term Goals - 09/10/21 1357       PT LONG TERM GOAL #1   Title Patient (> 24 years old) will complete five times sit to stand test in < 15 seconds indicating an increased LE strength and improved balance.    Baseline 9/21: 29 seconds hands-free 10/10: deferred 10/12: 34 sec hands free; 11/2: 23.8 sec hands-free; 12/7: 33.2 sec hands free, 1/3: 33.92 hands free; 06/25/21: 25.69 sec, 4/13: 15.5 sec hands  free, 5/18: 23 sec without UE    Time 8    Period Weeks    Status Partially Met    Target Date 10/20/21      PT LONG TERM GOAL #2   Title Patient will increase six minute walk test distance to >400 feet for improve gait ability    Baseline 9/21: 368 ft with RW; 10/10: deferred 10/12: 408 ft; 11/2: 476 ft with 4WW, 1/3: 475 feet with RW; 4/13: 450 feet without AD CGA    Time 8    Period Weeks    Status Achieved    Target Date 10/20/21      PT LONG TERM GOAL #3   Title Patient will increase 10 meter walk test to >1.32ms as to improve gait speed for better community ambulation and to reduce fall risk.    Baseline 9/21: 0.5 m/s with RW; 10/10 deferred 10/12: 0.93 m/s with 47DZ 11/2: 0.83 m/s with 43GD 12/7: 0.52 m/s with RW, 1/3: 0.704; 06/25/21: 0.55 m/s with SPC. 5/18: 0.4 m/s without AD    Time 8    Period Weeks    Status On-going    Target Date 10/20/21      PT LONG TERM GOAL #4   Title Patient will be independent with ascend/descend 6 steps using single UE in step over step pattern without LOB.    Baseline 9/21:  Patient uses PT stairs (4 steps) with bilateral upper extremity support on handrails for both ascending/descending.  Patient exhibits reciprocal pattern ascending and step to pattern descending. 10/10: deferred; 10/12: ascending/descending recip. steps with BUE support; 11/2: Ascended/descended 8 steps total, reciprocal pattern ascending and step-to descending. Pt uses UUE support throughout with CGA assist.; 12/7: Ascended and descended 8 steps total with UUE support and CGA. Reciprocal stepping ascending and step  to descending., 1/3: Deferrred; 06/25/21: able to perform with SUE support and reciprocal pattern asc/desc, but does require close CGA with descending. 5/18: independent with 2 handrails, close supervision with 1 handrai;    Time 8    Period Weeks    Status Partially Met    Target Date 10/20/21      PT LONG TERM GOAL #5   Title Patient will demonstrate an improved Berg  Balance Score of >45/56 as to demonstrate improved balance with ADLs such as sitting/standing and transfer balance and reduced fall risk.    Baseline 9/21: deferred d/t time; 9/29: 42/56; 10/10: deferred; 10/12: 44/56; 11/2: deferred; 11/7: 46/56, 1/3: deferred    Time 8    Period Weeks    Status Achieved    Target Date 10/20/21      PT LONG TERM GOAL #6   Title Patient will improve FOTO score to >60% to indicate improved functional mobility with ADLs.    Baseline 9/21: 59; 10/10: 57; 11/2: 60; 12/7: 58%, 1/3: 61%, 4/13: 56%    Time 8    Period Weeks    Status On-going    Target Date 10/20/21      PT LONG TERM GOAL #7   Title Pt will improve FGA by a minimum of 5 points to indicate clinically significant improvement in reduced risk of falls with walking tasks.    Baseline 06/25/21: 12, 4/13: deferred, 5/18: deferred due to increased ankle discomfort;    Time 8    Period Weeks    Status New    Target Date 10/20/21                   Plan - 09/17/21 1429     Clinical Impression Statement Patient motivated and participated well within session. She was instructed in advanced balance exercise utilizing compliant surface to challenge stance control. She does require intermittent rail assist with added challenge of unstable airex pad. Patient does exhibit less foot drag on red mat compared to previous sessions. She does require increased time/effort with gait to avoid foot drag. she is cautious with walking requiring increased time with slower gait speed. Patient would benefit from additional skilled PT intervention to improve strength, balance and mobility;    Personal Factors and Comorbidities Age    Comorbidities HTN, CKD, fall risk, type 2 diabetes,    Examination-Activity Limitations Lift;Locomotion Level;Squat;Stairs;Stand;Toileting;Transfers;Bathing    Examination-Participation Restrictions Cleaning;Community Activity;Laundry;Meal Prep;Shop;Volunteer;Driving     Stability/Clinical Decision Making Stable/Uncomplicated    Rehab Potential Fair    PT Frequency 2x / week    PT Duration 8 weeks    PT Treatment/Interventions Cryotherapy;Electrical Stimulation;Moist Heat;Gait training;Stair training;Functional mobility training;Therapeutic activities;Therapeutic exercise;Neuromuscular re-education;Balance training;Patient/family education;Orthotic Fit/Training;Energy conservation    PT Next Visit Plan work on LE strengthening and balance; gait training, endurance, continue POC as previously indicated    PT Home Exercise Plan Access Code: [email or web address]; no updates    Consulted and Agree with Plan of Care Patient             Patient will benefit from skilled therapeutic intervention in order to improve the following deficits and impairments:  Abnormal gait, Decreased balance, Decreased endurance, Decreased mobility, Difficulty walking, Cardiopulmonary status limiting activity, Decreased activity tolerance, Decreased coordination, Decreased strength  Visit Diagnosis: Muscle weakness (generalized)  Other lack of coordination  Difficulty in walking, not elsewhere classified  Unsteadiness on feet  Other abnormalities of gait and mobility  Apraxia  Abnormality of gait and mobility  Problem List Patient Active Problem List   Diagnosis Date Noted   Carotid stenosis, symptomatic, with infarction ([place]) 11/13/2020   Gout flare 11/10/2020   UTI (urinary tract infection) 11/10/2020   Left carotid artery stenosis 11/10/2020   Chronic kidney disease 11/10/2020   Left basal ganglia embolic stroke ([place]) 65/99/3570   PAC (premature atrial contraction)    Pre-procedural cardiovascular examination    Ischemic stroke ([place]) 10/20/2020   TIA (transient ischemic attack) 10/19/2020   Right hemiparesis ([place]) 10/19/2020   Type 2 diabetes mellitus with stage 3 chronic kidney disease, without long-term current use of insulin ([place]) 08/24/2018   Hyperlipidemia,  unspecified 07/19/2017   Hypertension 07/19/2017   PUD (peptic ulcer disease) 07/19/2017   Type 2 diabetes mellitus ([place]) 07/19/2017   Personal history of gout 08/12/2016   Anemia, unspecified 04/09/2016   Hypothyroidism, acquired 12/10/2015    Vail Basista, PT, DPT 09/17/2021, 2:31 PM  [place] MAIN [facility] SERVICES [place], [place], [address] Phone: [phone]   Fax:  [phone]  Name: Cassandra Schaefer MRN: [id number] Date of Birth: [birth year]

## 2021-09-17 NOTE — Therapy (Signed)
[place] MAIN [facility] SERVICES [address] [place], [place], [address] Phone: [phone]   Fax:  [phone]  Occupational Therapy Treatment  Patient Details  Name: Cassandra Schaefer MRN: [id number] Date of Birth: January 26, 1929 Referring Provider (OT): Dr. Tracie Harrier   Encounter Date: 09/17/2021   OT End of Session - 09/17/21 1622     Visit Number [id number]    Date for OT Re-Evaluation 10/06/21    OT Start Time 1310    OT Stop Time 1345    OT Time Calculation (min) 35 min    Activity Tolerance Patient tolerated treatment well    Behavior During Therapy WFL for tasks assessed/performed             Past Medical History:  Diagnosis Date   Anemia    Cough    RESOLVING, NO FEVER   Diabetes mellitus without complication ([place])    Gout    Hypertension    Hypothyroidism    PUD (peptic ulcer disease)    Stroke [facility])    Wears dentures    full upper, partial lower    Past Surgical History:  Procedure Laterality Date   AMPUTATION TOE Left 12/14/2017   Procedure: AMPUTATION TOE-4TH MPJ;  Surgeon: Samara Deist, DPM;  Location: [place];  Service: Podiatry;  Laterality: Left;  IVA LOCAL Diabetic - oral meds   APPENDECTOMY     CATARACT EXTRACTION W/PHACO Right 06/23/2015   Procedure: CATARACT EXTRACTION PHACO AND INTRAOCULAR LENS PLACEMENT (IOC);  Surgeon: Estill Cotta, MD;  Location: [facility];  Service: Ophthalmology;  Laterality: Right;  [place] 01:28 AP% 23.4 CDE 36.14 fluid pack lot # [id number]   CATARACT EXTRACTION W/PHACO Left 07/28/2015   Procedure: CATARACT EXTRACTION PHACO AND INTRAOCULAR LENS PLACEMENT (IOC);  Surgeon: Estill Cotta, MD;  Location: [facility];  Service: Ophthalmology;  Laterality: Left;  [place]    1:29.7 AP%  24.9 CDE   41.32 fluid casette lot [id number] H exp 09/23/2016   COONOSCOPY     AND ENDOSCOPY   DILATION AND CURETTAGE OF UTERUS     ENDARTERECTOMY Left 11/13/2020   Procedure: Evacuation of left neck  hematoma;  Surgeon: Katha Cabal, MD;  Location: [facility];  Service: Vascular;  Laterality: Left;   ENDARTERECTOMY Left 11/13/2020   Procedure: ENDARTERECTOMY CAROTID;  Surgeon: Algernon Huxley, MD;  Location: [facility];  Service: Vascular;  Laterality: Left;   TONSILLECTOMY     TUBAL LIGATION      There were no vitals filed for this visit.   Subjective Assessment - 09/17/21 1621     Subjective  Pt. reports that she has a UTI    Patient is accompanied by: Family member    Pertinent History R ankle pain, gout, T2DM, HTN, CKDIII; R/L carotid endarectomy    Currently in Pain? No/denies              OT TREATMENT     Therapeutic Ex.:   Pt. worked on pinch strengthening in the right hand for lateral, and 3pt. pinch using yellow, red, green, and blue resistive clips. Pt. worked on placing the clips at various horizontal angles, with emphasis placed on precision, and accuracy with reaching the target. Pt. worked on translatory movements moving the clips form the lateral pinch position to the 3pt. pinch position. Visual, tactile and verbal cues were required consistent visual cues, and tactile cues for eliciting the desired movement.    Neuro muscular re-education:   Pt. worked on right  hand [facility] skills grasping extra small flat cards 1/4" by 1/2" in size. Pt. worked on sliding them off the edge of a tabletop platform with her 2nd digit to her thumb. Pt. worked on flipping the cards, and stacking them.   Pt. was late for therapy today secondary to the longer wait at the [place] entrance of the hospital. Pt. continues to make progress with the accuracy of reaching for targets, and has been using her hand more during tasks at home. Pt. Continues to have difficulty with translatory movements moving the objects through her hand. Pt. initially had difficulty with refining the movements needed to grasp, manipulate the small flat objects, and flip them. As the task progressed, and the position of the  cards were modified. Pt. continues to have difficulty with performing translatroy movements, and requires visual cues for motor planning through the moving the clips from one position to the next. Pt. Required extensive verbal, visual, and tactile cues for motor planning through the movements.  Pt. continues to present with weakness, and decreased [facility] skills in order to improve UE functioning during ADL, and IADL skills.                        OT Education - 09/17/21 1622     Education Details RUE functioning.    Person(s) Educated Patient    Methods Explanation;Verbal cues;Demonstration;Tactile cues    Comprehension Verbalized understanding;Verbal cues required;Returned demonstration              OT Short Term Goals - 03/23/21 1657       OT SHORT TERM GOAL #1   Title Pt will perform HEP for RUE strength/coordination independently.    Baseline Eval: HEP not yet established in outpatient, but pt is working with putty from [name]; 12/04/2020; [facility] HEP initiated this day with mod vc after return demo; 01/05/2021: indep with currently program, but ongoing as pt progresses; 01/28/2021: vc to revisit and focus on grip and pinch strengthening with putty; 03/23/21: pt is indep with HEP    Time 6    Period Weeks    Status Achieved    Target Date 03/23/21               OT Long Term Goals - 09/03/21 1313       OT LONG TERM GOAL #1   Title Pt will improve hand writing to 100% legibility with R hand to be able to independently write a check.    Baseline Eval: signature is 75% legible with R hand, requiring extra time; 12/04/2020: no chan to complete.ge from eval; 01/05/2021: Printing is 100% legible, signature is 90% legible, but still requires extra time and effort.; 01/26/21: Signature is 90% legible and speed/fluidity have improved. 20th visit: 90% legible, 03/23/21: 90% legible, extra time 40th visit 90 % legibility with increased time, 50th visit: 90% legible with with  increased time required 07/14/2021: 90% legibility. 07/30/2021: 90% legibility, pt. presses too hard through the pen, and pencil. 09/03/2021: 90% legibility    Time 12    Period Weeks    Status On-going    Target Date 10/06/21      OT LONG TERM GOAL #2   Title Pt will improve R hand coordination to enable good manipulation of iphone using R dominant hand      OT LONG TERM GOAL #3   Title Pt will improve GMC throughout RUE to enable pt to reach for and pick up ADL supplies  with R dominant hand without dropping or knocking over objects.    Baseline Eval: Pt reports RUE is clumsy and easily knocks over objects when trying to reach for ADL supplies.  Pt verbalizes that her "aim is off."; 12/04/2020: pt reports slight improvement since eval and has started using her R hand to eat, but still feels clumsy; 01/05/2021: Greatly improved; pt is using silverware to eat with R hand, can comb hair with RUE, still mildly ataxic; 01/26/21: pt reports difficulty picking up objects within a small space (ie; may knock the toothpaste holder down when reaching for toothbrush), but reaching for light/larger objects is improving 20th visit: pt. continues to be mildly ataxic, however is consistently improving with motor control, and [place] while reaching targets; 03/23/21: Pt reaches with accuracy towards targets if she takes her time (pt drops and knocks things over when moving at a faster/normal pace) 04/23/2021: Pt. continues to present with difficulty with depth perception, and impaired [place].40th visit: Pt. is improving, however tends to knock over lighter weight objects.50th visit: Pt. has improved with reaching for items without knocking them over, however continues to need work on accuracy.07/14/2021: Pt. is improving while motor control, and accuracy of reaching for objects. Pt. continues to work towards improving efficiency with reaching for objects. 07/30/2021: Pt. is able to able to reach for objects more accurately, however  continues to present with limited motor control in the Right. 09/03/2021: Pt. Pt. is improving with reaching up without knocking items over. pt. is now able to stand at the sink and reach up without dropping items.    Time 12    Period Weeks    Status On-going    Target Date 10/06/21      OT LONG TERM GOAL #4   Title Pt will improve FOTO score to 68 or better to indicate measurable functional improvement.    Baseline Eval: FOTO 55; 01/05/2021: FOTO 61; 01/28/21: FOTO 61, 20th visit: 61; 03/23/21: FOTO 60 4oth visit: FOTO score: 61, 50th visit: FOTO 69 07/30/2021: FOTO 71  09/03/2021: FOTO 73    Time 12    Period Weeks    Status On-going    Target Date 10/06/21      OT LONG TERM GOAL #5   Time 12      OT LONG TERM GOAL #6   Title Pt. will improve right hand [facility] skills to be able to indepedendently manipulate and set-up her medication/pills in the pillbox.    Baseline 07/14/2021: Pt. has difficulty manipulating small pills, and often drops them. 07/30/2021: Pt. continues to have difficulty grasping pills/medication for set-up of pillbox. 09/03/2021: Pt. is able to grasp pills from the pillbox without dropping them.    Time 12    Period Weeks    Status New    Target Date 10/06/21                   Plan - 09/17/21 1623     Clinical Impression Statement Pt. was late for therapy today secondary to the longer wait at the [place] entrance of the hospital. Pt. continues to make progress with the accuracy of reaching for targets, and has been using her hand more during tasks at home. Pt. Continues to have difficulty with translatory movements moving the objects through her hand. Pt. initially had difficulty with refining the movements needed to grasp, manipulate the small flat objects, and flip them. As the task progressed, and the position of the cards were modified. Pt. continues  to have difficulty with performing translatroy movements, and requires visual cues for motor planning through the  moving the clips from one position to the next. Pt. Required extensive verbal, visual, and tactile cues for motor planning through the movements.  Pt. continues to present with weakness, and decreased [facility] skills in order to improve UE functioning during ADL, and IADL skills.              OT Occupational Profile and History Detailed Assessment- Review of Records and additional review of physical, cognitive, psychosocial history related to current functional performance    Occupational performance deficits (Please refer to evaluation for details): ADL's;Leisure;IADL's    Body Structure / Function / Physical Skills ADL;Coordination;Endurance;GMC;UE functional use;Balance;Sensation;Body mechanics;IADL;Pain;Dexterity;FMC;Strength;Gait;Mobility    Rehab Potential Good    Clinical Decision Making Several treatment options, min-mod task modification necessary    Comorbidities Affecting Occupational Performance: May have comorbidities impacting occupational performance    Modification or Assistance to Complete Evaluation  Min-Moderate modification of tasks or assist with assess necessary to complete eval    OT Frequency 2x / week    OT Duration 12 weeks    OT Treatment/Interventions Self-care/ADL training;Therapeutic exercise;DME and/or AE instruction;Functional Mobility Training;Balance training;Neuromuscular education;Therapeutic activities;Patient/family education    Consulted and Agree with Plan of Care Patient             Patient will benefit from skilled therapeutic intervention in order to improve the following deficits and impairments:   Body Structure / Function / Physical Skills: ADL, Coordination, Endurance, GMC, UE functional use, Balance, Sensation, Body mechanics, IADL, Pain, Dexterity, FMC, Strength, Gait, Mobility       Visit Diagnosis: Muscle weakness (generalized)  Other lack of coordination    Problem List Patient Active Problem List   Diagnosis Date Noted   Carotid  stenosis, symptomatic, with infarction ([place]) 11/13/2020   Gout flare 11/10/2020   UTI (urinary tract infection) 11/10/2020   Left carotid artery stenosis 11/10/2020   Chronic kidney disease 11/10/2020   Left basal ganglia embolic stroke ([place]) [id number]   PAC (premature atrial contraction)    Pre-procedural cardiovascular examination    Ischemic stroke ([place]) 10/20/2020   TIA (transient ischemic attack) 10/19/2020   Right hemiparesis ([place]) 10/19/2020   Type 2 diabetes mellitus with stage 3 chronic kidney disease, without long-term current use of insulin ([place]) 08/24/2018   Hyperlipidemia, unspecified 07/19/2017   Hypertension 07/19/2017   PUD (peptic ulcer disease) 07/19/2017   Type 2 diabetes mellitus ([place]) 07/19/2017   Personal history of gout 08/12/2016   Anemia, unspecified 04/09/2016   Hypothyroidism, acquired 12/10/2015   Harrel Carina, MS, OTR/L   Harrel Carina, OT 09/17/2021, 4:24 PM  [place] MAIN [facility] SERVICES [address] [place], [place], [address] Phone: [phone]   Fax:  [phone]  Name: Cassandra Schaefer MRN: [id number] Date of Birth: [birth year]

## 2021-09-22 ENCOUNTER — Encounter: Payer: Self-pay | Admitting: Occupational Therapy

## 2021-09-22 ENCOUNTER — Encounter: Payer: Self-pay | Admitting: Physical Therapy

## 2021-09-22 ENCOUNTER — Ambulatory Visit: Payer: Medicare HMO | Admitting: Physical Therapy

## 2021-09-22 ENCOUNTER — Ambulatory Visit: Payer: Medicare HMO | Admitting: Occupational Therapy

## 2021-09-22 DIAGNOSIS — M6281 Muscle weakness (generalized): Secondary | ICD-10-CM

## 2021-09-22 DIAGNOSIS — R2689 Other abnormalities of gait and mobility: Secondary | ICD-10-CM

## 2021-09-22 DIAGNOSIS — R2681 Unsteadiness on feet: Secondary | ICD-10-CM

## 2021-09-22 DIAGNOSIS — R262 Difficulty in walking, not elsewhere classified: Secondary | ICD-10-CM

## 2021-09-22 DIAGNOSIS — R482 Apraxia: Secondary | ICD-10-CM

## 2021-09-22 DIAGNOSIS — R278 Other lack of coordination: Secondary | ICD-10-CM

## 2021-09-22 DIAGNOSIS — R269 Unspecified abnormalities of gait and mobility: Secondary | ICD-10-CM

## 2021-09-22 NOTE — Therapy (Signed)
[place] MAIN [facility] SERVICES [address] [place], [place], [address] Phone: [phone]   Fax:  [phone]  Occupational Therapy Treatment  Patient Details  Name: Cassandra Schaefer MRN: [id number] Date of Birth: 30-Jul-1928 Referring Provider (OT): Dr. Tracie Harrier   Encounter Date: 09/22/2021   OT End of Session - 09/22/21 1330     Visit Number 35    Number of Visits 82    Date for OT Re-Evaluation 10/06/21    OT Start Time 1300    OT Stop Time 1345    OT Time Calculation (min) 45 min    Activity Tolerance Patient tolerated treatment well    Behavior During Therapy WFL for tasks assessed/performed             Past Medical History:  Diagnosis Date   Anemia    Cough    RESOLVING, NO FEVER   Diabetes mellitus without complication ([place])    Gout    Hypertension    Hypothyroidism    PUD (peptic ulcer disease)    Stroke [facility])    Wears dentures    full upper, partial lower    Past Surgical History:  Procedure Laterality Date   AMPUTATION TOE Left 12/14/2017   Procedure: AMPUTATION TOE-4TH MPJ;  Surgeon: Samara Deist, DPM;  Location: [place];  Service: Podiatry;  Laterality: Left;  IVA LOCAL Diabetic - oral meds   APPENDECTOMY     CATARACT EXTRACTION W/PHACO Right 06/23/2015   Procedure: CATARACT EXTRACTION PHACO AND INTRAOCULAR LENS PLACEMENT (IOC);  Surgeon: Estill Cotta, MD;  Location: [facility];  Service: Ophthalmology;  Laterality: Right;  [place] 01:28 AP% 23.4 CDE 36.14 fluid pack lot # [id number]   CATARACT EXTRACTION W/PHACO Left 07/28/2015   Procedure: CATARACT EXTRACTION PHACO AND INTRAOCULAR LENS PLACEMENT (IOC);  Surgeon: Estill Cotta, MD;  Location: [facility];  Service: Ophthalmology;  Laterality: Left;  [place]    1:29.7 AP%  24.9 CDE   41.32 fluid casette lot [id number] H exp 09/23/2016   COONOSCOPY     AND ENDOSCOPY   DILATION AND CURETTAGE OF UTERUS     ENDARTERECTOMY Left 11/13/2020   Procedure:  Evacuation of left neck hematoma;  Surgeon: Katha Cabal, MD;  Location: [facility];  Service: Vascular;  Laterality: Left;   ENDARTERECTOMY Left 11/13/2020   Procedure: ENDARTERECTOMY CAROTID;  Surgeon: Algernon Huxley, MD;  Location: [facility];  Service: Vascular;  Laterality: Left;   TONSILLECTOMY     TUBAL LIGATION      There were no vitals filed for this visit.   Subjective Assessment - 09/22/21 1329     Subjective  Pt. reports that she is feeling better today    Patient is accompanied by: Family member    Pertinent History R ankle pain, gout, T2DM, HTN, CKDIII; R/L carotid endarectomy    Currently in Pain? No/denies            OT TREATMENT     Therapeutic Ex.:   Pt. worked on pinch strengthening in the right hand for lateral, and 3pt. pinch using yellow, red, green, and blue resistive clips. Pt. worked on placing the clips at various horizontal angles, with emphasis placed on precision, and accuracy with reaching the target. Pt. worked on translatory movements moving the clips form the lateral pinch position to the 3pt. pinch position. Visual, tactile and verbal cues were required consistent visual cues, and tactile cues for eliciting the desired movement.    Neuro muscular re-education:  Pt. worked on right hand [facility] skills grasping extra small flat cards 1/4" by 1/2" in size. Pt. worked on sliding them off the edge of a tabletop platform with her 2nd digit to her thumb. Pt. worked on flipping the cards, and stacking them.    Pt reports that she turned 10 today. Pt. reports that she notices the she over grips items, and uses too much force when grasping for light objects. Pt. continues to make progress with the accuracy of reaching for targets, and has been using her hand more during tasks at home. Pt. Continues to have difficulty with translatory movements moving the objects through her hand. Pt. initially had difficulty with refining the movements needed to grasp, manipulate  the small flat objects, and flip them. As the task progressed, and the position of the cards were modified. Pt. continues to have difficulty with performing translatroy movements, and requires visual cues for motor planning through the moving the clips from one position to the next. Pt. Required extensive verbal, visual, and tactile cues for motor planning through the movements.  Pt. continues to present with weakness, and decreased [facility] skills in order to improve UE functioning during ADL, and IADL skills.                           OT Education - 09/22/21 1330     Education Details RUE functioning.    Person(s) Educated Patient    Methods Explanation;Verbal cues;Demonstration;Tactile cues    Comprehension Verbalized understanding;Verbal cues required;Returned demonstration              OT Short Term Goals - 03/23/21 1657       OT SHORT TERM GOAL #1   Title Pt will perform HEP for RUE strength/coordination independently.    Baseline Eval: HEP not yet established in outpatient, but pt is working with putty from [name]; 12/04/2020; [facility] HEP initiated this day with mod vc after return demo; 01/05/2021: indep with currently program, but ongoing as pt progresses; 01/28/2021: vc to revisit and focus on grip and pinch strengthening with putty; 03/23/21: pt is indep with HEP    Time 6    Period Weeks    Status Achieved    Target Date 03/23/21               OT Long Term Goals - 09/03/21 1313       OT LONG TERM GOAL #1   Title Pt will improve hand writing to 100% legibility with R hand to be able to independently write a check.    Baseline Eval: signature is 75% legible with R hand, requiring extra time; 12/04/2020: no chan to complete.ge from eval; 01/05/2021: Printing is 100% legible, signature is 90% legible, but still requires extra time and effort.; 01/26/21: Signature is 90% legible and speed/fluidity have improved. 20th visit: 90% legible, 03/23/21: 90% legible, extra  time 40th visit 90 % legibility with increased time, 50th visit: 90% legible with with increased time required 07/14/2021: 90% legibility. 07/30/2021: 90% legibility, pt. presses too hard through the pen, and pencil. 09/03/2021: 90% legibility    Time 12    Period Weeks    Status On-going    Target Date 10/06/21      OT LONG TERM GOAL #2   Title Pt will improve R hand coordination to enable good manipulation of iphone using R dominant hand      OT LONG TERM GOAL #3   Title  Pt will improve GMC throughout RUE to enable pt to reach for and pick up ADL supplies with R dominant hand without dropping or knocking over objects.    Baseline Eval: Pt reports RUE is clumsy and easily knocks over objects when trying to reach for ADL supplies.  Pt verbalizes that her "aim is off."; 12/04/2020: pt reports slight improvement since eval and has started using her R hand to eat, but still feels clumsy; 01/05/2021: Greatly improved; pt is using silverware to eat with R hand, can comb hair with RUE, still mildly ataxic; 01/26/21: pt reports difficulty picking up objects within a small space (ie; may knock the toothpaste holder down when reaching for toothbrush), but reaching for light/larger objects is improving 20th visit: pt. continues to be mildly ataxic, however is consistently improving with motor control, and [place] while reaching targets; 03/23/21: Pt reaches with accuracy towards targets if she takes her time (pt drops and knocks things over when moving at a faster/normal pace) 04/23/2021: Pt. continues to present with difficulty with depth perception, and impaired [place].40th visit: Pt. is improving, however tends to knock over lighter weight objects.50th visit: Pt. has improved with reaching for items without knocking them over, however continues to need work on accuracy.07/14/2021: Pt. is improving while motor control, and accuracy of reaching for objects. Pt. continues to work towards improving efficiency with reaching for  objects. 07/30/2021: Pt. is able to able to reach for objects more accurately, however continues to present with limited motor control in the Right. 09/03/2021: Pt. Pt. is improving with reaching up without knocking items over. pt. is now able to stand at the sink and reach up without dropping items.    Time 12    Period Weeks    Status On-going    Target Date 10/06/21      OT LONG TERM GOAL #4   Title Pt will improve FOTO score to 68 or better to indicate measurable functional improvement.    Baseline Eval: FOTO 55; 01/05/2021: FOTO 61; 01/28/21: FOTO 61, 20th visit: 61; 03/23/21: FOTO 60 4oth visit: FOTO score: 61, 50th visit: FOTO 69 07/30/2021: FOTO 71  09/03/2021: FOTO 73    Time 12    Period Weeks    Status On-going    Target Date 10/06/21      OT LONG TERM GOAL #5   Time 12      OT LONG TERM GOAL #6   Title Pt. will improve right hand [facility] skills to be able to indepedendently manipulate and set-up her medication/pills in the pillbox.    Baseline 07/14/2021: Pt. has difficulty manipulating small pills, and often drops them. 07/30/2021: Pt. continues to have difficulty grasping pills/medication for set-up of pillbox. 09/03/2021: Pt. is able to grasp pills from the pillbox without dropping them.    Time 12    Period Weeks    Status New    Target Date 10/06/21                   Plan - 09/22/21 1325     Clinical Impression Statement Pt reports that she turned 37 today. Pt. reports that she notices the she over grips items, and uses too much force when grasping for light objects. Pt. continues to make progress with the accuracy of reaching for targets, and has been using her hand more during tasks at home. Pt. Continues to have difficulty with translatory movements moving the objects through her hand. Pt. initially had difficulty with refining the  movements needed to grasp, manipulate the small flat objects, and flip them. As the task progressed, and the position of the cards were  modified. Pt. continues to have difficulty with performing translatroy movements, and requires visual cues for motor planning through the moving the clips from one position to the next. Pt. Required extensive verbal, visual, and tactile cues for motor planning through the movements.  Pt. continues to present with weakness, and decreased [facility] skills in order to improve UE functioning during ADL, and IADL skills.        OT Occupational Profile and History Detailed Assessment- Review of Records and additional review of physical, cognitive, psychosocial history related to current functional performance    Occupational performance deficits (Please refer to evaluation for details): ADL's;Leisure;IADL's    Body Structure / Function / Physical Skills ADL;Coordination;Endurance;GMC;UE functional use;Balance;Sensation;Body mechanics;IADL;Pain;Dexterity;FMC;Strength;Gait;Mobility    Rehab Potential Good    Clinical Decision Making Several treatment options, min-mod task modification necessary    Comorbidities Affecting Occupational Performance: May have comorbidities impacting occupational performance    Modification or Assistance to Complete Evaluation  Min-Moderate modification of tasks or assist with assess necessary to complete eval    OT Frequency 2x / week    OT Duration 12 weeks    OT Treatment/Interventions Self-care/ADL training;Therapeutic exercise;DME and/or AE instruction;Functional Mobility Training;Balance training;Neuromuscular education;Therapeutic activities;Patient/family education    Consulted and Agree with Plan of Care Patient             Patient will benefit from skilled therapeutic intervention in order to improve the following deficits and impairments:   Body Structure / Function / Physical Skills: ADL, Coordination, Endurance, GMC, UE functional use, Balance, Sensation, Body mechanics, IADL, Pain, Dexterity, FMC, Strength, Gait, Mobility       Visit Diagnosis: Muscle weakness  (generalized)  Other lack of coordination    Problem List Patient Active Problem List   Diagnosis Date Noted   Carotid stenosis, symptomatic, with infarction ([place]) 11/13/2020   Gout flare 11/10/2020   UTI (urinary tract infection) 11/10/2020   Left carotid artery stenosis 11/10/2020   Chronic kidney disease 11/10/2020   Left basal ganglia embolic stroke ([place]) [id number]   PAC (premature atrial contraction)    Pre-procedural cardiovascular examination    Ischemic stroke ([place]) 10/20/2020   TIA (transient ischemic attack) 10/19/2020   Right hemiparesis ([place]) 10/19/2020   Type 2 diabetes mellitus with stage 3 chronic kidney disease, without long-term current use of insulin ([place]) 08/24/2018   Hyperlipidemia, unspecified 07/19/2017   Hypertension 07/19/2017   PUD (peptic ulcer disease) 07/19/2017   Type 2 diabetes mellitus ([place]) 07/19/2017   Personal history of gout 08/12/2016   Anemia, unspecified 04/09/2016   Hypothyroidism, acquired 12/10/2015   Harrel Carina, MS, OTR/L   Harrel Carina, OT 09/22/2021, 1:32 PM  [place] MAIN [facility] SERVICES [address] [place], [place], [address] Phone: [phone]   Fax:  [phone]  Name: Cassandra Schaefer MRN: [id number] Date of Birth: [birth year]

## 2021-09-22 NOTE — Therapy (Unsigned)
[place] [facility] SERVICES [place], [place], [address] Phone: [phone]   Fax:  [phone]  Physical Therapy Treatment  Patient Details  Name: Cassandra Schaefer MRN: [id number] Date of Birth: 03-Nov-1928 Referring Provider (PT): Dr. Dew/Dr. Ginette Pitman PCP   Encounter Date: 09/22/2021   PT End of Session - 09/22/21 1353     Visit Number 50    Number of Visits 11    Date for PT Re-Evaluation 10/20/21    Authorization Type [employer]    PT Start Time 1348    PT Stop Time 1430    PT Time Calculation (min) 42 min    Equipment Utilized During Treatment Gait belt    Activity Tolerance Patient tolerated treatment well    Behavior During Therapy WFL for tasks assessed/performed             Past Medical History:  Diagnosis Date   Anemia    Cough    RESOLVING, NO FEVER   Diabetes mellitus without complication ([place])    Gout    Hypertension    Hypothyroidism    PUD (peptic ulcer disease)    Stroke [facility])    Wears dentures    full upper, partial lower    Past Surgical History:  Procedure Laterality Date   AMPUTATION TOE Left 12/14/2017   Procedure: AMPUTATION TOE-4TH MPJ;  Surgeon: Samara Deist, DPM;  Location: [place];  Service: Podiatry;  Laterality: Left;  IVA LOCAL Diabetic - oral meds   APPENDECTOMY     CATARACT EXTRACTION W/PHACO Right 06/23/2015   Procedure: CATARACT EXTRACTION PHACO AND INTRAOCULAR LENS PLACEMENT (IOC);  Surgeon: Estill Cotta, MD;  Location: [facility];  Service: Ophthalmology;  Laterality: Right;  [place] 01:28 AP% 23.4 CDE 36.14 fluid pack lot # [id number]   CATARACT EXTRACTION W/PHACO Left 07/28/2015   Procedure: CATARACT EXTRACTION PHACO AND INTRAOCULAR LENS PLACEMENT (IOC);  Surgeon: Estill Cotta, MD;  Location: [facility];  Service: Ophthalmology;  Laterality: Left;  [place]    1:29.7 AP%  24.9 CDE   41.32 fluid casette lot #[id number] H exp 09/23/2016   COONOSCOPY     AND ENDOSCOPY    DILATION AND CURETTAGE OF UTERUS     ENDARTERECTOMY Left 11/13/2020   Procedure: Evacuation of left neck hematoma;  Surgeon: Katha Cabal, MD;  Location: [facility];  Service: Vascular;  Laterality: Left;   ENDARTERECTOMY Left 11/13/2020   Procedure: ENDARTERECTOMY CAROTID;  Surgeon: Algernon Huxley, MD;  Location: [facility];  Service: Vascular;  Laterality: Left;   TONSILLECTOMY     TUBAL LIGATION      There were no vitals filed for this visit.   Subjective Assessment - 09/22/21 1351     Subjective Patient reports having a great weekend with family, went out to eat this weekend.    Patient is accompained by: --   Daughter, [name]   Pertinent History 88 y.o. female with medical history significant for type 2 diabetes mellitus, essential hypertension, acquired hypothyroidism, hyperlipidemia, stage III chronic kidney disease reports increased right sided weakness on 10/19/20. She was diagnosed with left basal ganglia ischemic stroke. She was discharged to inpatient rehab for 10 days and then discharged home. Patient was evaluated by outpatient PT on 11/12/20. CVA was due to carotid stenosis. She underwent left carotid endartectomy on 7/21. Procedure went well however patient suffered from left cervical hematoma and had subsequent surgical procedure to stop the bleed and remove the hematoma on 11/13/20. They were  concerned with her breathing and therefore intubated her for 1 day, she was in ICU for 2 days and transferred to med/surge on 11/15/20. Patient was discharged home on 11/17/20 and is now returning to outpatient PT. She has had the stiches removed and is healing well. Denies any neck pain or stiffness. She lives with her daughter and has 24/7 caregiver support. She is still using RW for most ambulation. She is able to transfer to bedside commode well and is able to stand short time unsupported. She reports feeling very weak and fatigued. She reports she has been dragging her right foot more. She  denies any numbness/tingling; She reports feeling like her legs are wanting to do more but is hesitant because of fear of falling. She is mod I for self care morning routine. She does still receive help with showering. She has been working on increasing her activity but denies any formal exercise.    Limitations Standing;Walking    How long can you sit comfortably? NA    How long can you stand comfortably? 30-60 min with support    How long can you walk comfortably? about 100 feet with RW, still limited to short distances;    Diagnostic tests MRI shows left basal ganglia infarct 10/21/20    Patient Stated Goals "Improve balance and walking and not need RW, get back to independent."    Currently in Pain? No/denies    Pain Onset In the past 7 days                TREATMENT: Warm up on Nustep BLE level 2-4 interval training x4 min with cues to keep steps per minute >70 to challenge cardiovascular conditioning;  Walking on firm surface x80 feet x1 laps with min A for safety;   Exercise: Forward step ups on 6 inch step with 2-1 rail assist x10 reps each LE;    Seated: Hamstring curl green tband x15 reps each LE, decreased ROM on RLE due to weakness;     NMR Walking on firm surface Forward walking with high knee march x20 feet, min a for safety, moderate difficulty;   Forward walking with head turns x20 feet     Standing on 1/2 bolster: (Round side up): -heel raises x15 reps -toe raises x15 reps Pt required CGA and cues for increased ROM for better ankle strengthening   Walking on red mat to challenge dynamic balance: Forward/backward walking x5 laps Side stepping x3 laps Pt unsupported, required CGA to min A for safety; Pt able to exhibit less right foot drag compared to previous sessions; She does exhibit increased instability with posterior walking;   On red mat: Stepping over 1/2 bolster without rail assist x5 reps with min A for safety, increased difficulty stepping  backward but was able to exhibit better foot clearance with less RLE foot drag compared to previous sessions;     Tolerated session well. Reports minimal  fatigue but denies any soreness or pain;                                 PT Education - 09/22/21 1353     Education Details gait safety/positioning;    Person(s) Educated Patient    Methods Explanation;Verbal cues    Comprehension Verbalized understanding;Returned demonstration;Verbal cues required;Need further instruction              PT Short Term Goals - 09/10/21 1354  PT SHORT TERM GOAL #1   Title Patient will be adherent to HEP at least 3x a week to improve functional strength and balance for better safety at home.    Baseline 9/21: Pt reports compliance with HEP and is confident, 1/3: doing exercise 2x a week; 06/25/21: 3x/week, 5/18: consistent;    Time 4    Period Weeks    Status Achieved    Target Date 06/25/21      PT SHORT TERM GOAL #2   Title Patient will be independent in transferring sit<>Stand without pushing on arm rests to improve ability to get up from chair.    Baseline 9/21: pt able to complete STS without use of UEs, 1/3:able to stand but has moderate difficulty requiring increased time; 06/25/21: able to perform indep without hands but requiring use of momentum to attain standing. Remains stable. 5/18: able to stand without UE assist, close supervision, remains stable;    Time 4    Period Weeks    Status Achieved    Target Date 07/23/21               PT Long Term Goals - 09/10/21 1357       PT LONG TERM GOAL #1   Title Patient (> 65 years old) will complete five times sit to stand test in < 15 seconds indicating an increased LE strength and improved balance.    Baseline 9/21: 29 seconds hands-free 10/10: deferred 10/12: 34 sec hands free; 11/2: 23.8 sec hands-free; 12/7: 33.2 sec hands free, 1/3: 33.92 hands free; 06/25/21: 25.69 sec, 4/13: 15.5 sec hands free, 5/18: 23  sec without UE    Time 8    Period Weeks    Status Partially Met    Target Date 10/20/21      PT LONG TERM GOAL #2   Title Patient will increase six minute walk test distance to >400 feet for improve gait ability    Baseline 9/21: 368 ft with RW; 10/10: deferred 10/12: 408 ft; 11/2: 476 ft with 4WW, 1/3: 475 feet with RW; 4/13: 450 feet without AD CGA    Time 8    Period Weeks    Status Achieved    Target Date 10/20/21      PT LONG TERM GOAL #3   Title Patient will increase 10 meter walk test to >1.70ms as to improve gait speed for better community ambulation and to reduce fall risk.    Baseline 9/21: 0.5 m/s with RW; 10/10 deferred 10/12: 0.93 m/s with 43EX 11/2: 0.83 m/s with 46DY 12/7: 0.52 m/s with RW, 1/3: 0.704; 06/25/21: 0.55 m/s with SPC. 5/18: 0.4 m/s without AD    Time 8    Period Weeks    Status On-going    Target Date 10/20/21      PT LONG TERM GOAL #4   Title Patient will be independent with ascend/descend 6 steps using single UE in step over step pattern without LOB.    Baseline 9/21:  Patient uses PT stairs (4 steps) with bilateral upper extremity support on handrails for both ascending/descending.  Patient exhibits reciprocal pattern ascending and step to pattern descending. 10/10: deferred; 10/12: ascending/descending recip. steps with BUE support; 11/2: Ascended/descended 8 steps total, reciprocal pattern ascending and step-to descending. Pt uses UUE support throughout with CGA assist.; 12/7: Ascended and descended 8 steps total with UUE support and CGA. Reciprocal stepping ascending and step to descending., 1/3: Deferrred; 06/25/21: able to perform with SUE support and reciprocal  pattern asc/desc, but does require close CGA with descending. 5/18: independent with 2 handrails, close supervision with 1 handrai;    Time 8    Period Weeks    Status Partially Met    Target Date 10/20/21      PT LONG TERM GOAL #5   Title Patient will demonstrate an improved Berg Balance Score  of >45/56 as to demonstrate improved balance with ADLs such as sitting/standing and transfer balance and reduced fall risk.    Baseline 9/21: deferred d/t time; 9/29: 42/56; 10/10: deferred; 10/12: 44/56; 11/2: deferred; 11/7: 46/56, 1/3: deferred    Time 8    Period Weeks    Status Achieved    Target Date 10/20/21      PT LONG TERM GOAL #6   Title Patient will improve FOTO score to >60% to indicate improved functional mobility with ADLs.    Baseline 9/21: 59; 10/10: 57; 11/2: 60; 12/7: 58%, 1/3: 61%, 4/13: 56%    Time 8    Period Weeks    Status On-going    Target Date 10/20/21      PT LONG TERM GOAL #7   Title Pt will improve FGA by a minimum of 5 points to indicate clinically significant improvement in reduced risk of falls with walking tasks.    Baseline 06/25/21: 12, 4/13: deferred, 5/18: deferred due to increased ankle discomfort;    Time 8    Period Weeks    Status New    Target Date 10/20/21                   Plan - 09/23/21 0827     Clinical Impression Statement Patient motivated and participated well within session. She was instructed in advanced balance exercise. Patient continues to exhibit occasional RLE foot drag requiring CGA when walking unsupported. Patient does fatigue quickly when walking on unstable surfaces, requiring min A and increased time/effort. She requires short seated rest breaks intermittently for recovery. Patient instructed in cardiovascular conditioning exercise. She would benefit from additional skilled PT intervention to improve strength, balance and mobility; Consider working on gait with SPC to improve dymamic balance with gait.    Personal Factors and Comorbidities Age    Comorbidities HTN, CKD, fall risk, type 2 diabetes,    Examination-Activity Limitations Lift;Locomotion Level;Squat;Stairs;Stand;Toileting;Transfers;Bathing    Examination-Participation Restrictions Cleaning;Community Activity;Laundry;Meal Prep;Shop;Volunteer;Driving     Stability/Clinical Decision Making Stable/Uncomplicated    Rehab Potential Fair    PT Frequency 2x / week    PT Duration 8 weeks    PT Treatment/Interventions Cryotherapy;Electrical Stimulation;Moist Heat;Gait training;Stair training;Functional mobility training;Therapeutic activities;Therapeutic exercise;Neuromuscular re-education;Balance training;Patient/family education;Orthotic Fit/Training;Energy conservation    PT Next Visit Plan work on LE strengthening and balance; gait training, endurance, continue POC as previously indicated    PT Home Exercise Plan Access Code: [email or web address]; no updates    Consulted and Agree with Plan of Care Patient             Patient will benefit from skilled therapeutic intervention in order to improve the following deficits and impairments:  Abnormal gait, Decreased balance, Decreased endurance, Decreased mobility, Difficulty walking, Cardiopulmonary status limiting activity, Decreased activity tolerance, Decreased coordination, Decreased strength  Visit Diagnosis: Muscle weakness (generalized)  Other lack of coordination  Difficulty in walking, not elsewhere classified  Unsteadiness on feet  Other abnormalities of gait and mobility  Apraxia  Abnormality of gait and mobility     Problem List Patient Active Problem List   Diagnosis Date Noted  Carotid stenosis, symptomatic, with infarction ([place]) 11/13/2020   Gout flare 11/10/2020   UTI (urinary tract infection) 11/10/2020   Left carotid artery stenosis 11/10/2020   Chronic kidney disease 11/10/2020   Left basal ganglia embolic stroke ([place]) 07/29/5911   PAC (premature atrial contraction)    Pre-procedural cardiovascular examination    Ischemic stroke ([place]) 10/20/2020   TIA (transient ischemic attack) 10/19/2020   Right hemiparesis ([place]) 10/19/2020   Type 2 diabetes mellitus with stage 3 chronic kidney disease, without long-term current use of insulin ([place]) 08/24/2018   Hyperlipidemia,  unspecified 07/19/2017   Hypertension 07/19/2017   PUD (peptic ulcer disease) 07/19/2017   Type 2 diabetes mellitus ([place]) 07/19/2017   Personal history of gout 08/12/2016   Anemia, unspecified 04/09/2016   Hypothyroidism, acquired 12/10/2015    Illyria Sobocinski, PT, DPT 09/23/2021, 8:35 AM  [place] [address] [place], [place], [address] Phone: [phone]   Fax:  [phone]  Name: Cassandra Schaefer MRN: [id number] Date of Birth: [birth year]

## 2021-09-24 ENCOUNTER — Encounter: Payer: Medicare HMO | Admitting: Occupational Therapy

## 2021-09-24 ENCOUNTER — Ambulatory Visit: Payer: Medicare HMO | Admitting: Physical Therapy

## 2021-09-24 ENCOUNTER — Encounter: Payer: Self-pay | Admitting: Physical Therapy

## 2021-09-24 ENCOUNTER — Encounter: Payer: Self-pay | Admitting: Occupational Therapy

## 2021-09-24 ENCOUNTER — Ambulatory Visit: Payer: Medicare HMO | Attending: Vascular Surgery | Admitting: Occupational Therapy

## 2021-09-24 DIAGNOSIS — I639 Cerebral infarction, unspecified: Secondary | ICD-10-CM | POA: Insufficient documentation

## 2021-09-24 DIAGNOSIS — R2681 Unsteadiness on feet: Secondary | ICD-10-CM | POA: Diagnosis present

## 2021-09-24 DIAGNOSIS — R262 Difficulty in walking, not elsewhere classified: Secondary | ICD-10-CM | POA: Diagnosis present

## 2021-09-24 DIAGNOSIS — R482 Apraxia: Secondary | ICD-10-CM | POA: Diagnosis present

## 2021-09-24 DIAGNOSIS — R269 Unspecified abnormalities of gait and mobility: Secondary | ICD-10-CM | POA: Diagnosis present

## 2021-09-24 DIAGNOSIS — M6281 Muscle weakness (generalized): Secondary | ICD-10-CM

## 2021-09-24 DIAGNOSIS — R278 Other lack of coordination: Secondary | ICD-10-CM | POA: Insufficient documentation

## 2021-09-24 DIAGNOSIS — R2689 Other abnormalities of gait and mobility: Secondary | ICD-10-CM

## 2021-09-24 NOTE — Therapy (Signed)
[place] MAIN [facility] SERVICES [place], [place], [address] Phone: [phone]   Fax:  [phone]  Occupational Therapy Treatment  Patient Details  Name: Cassandra Schaefer MRN: [id number] Date of Birth: 05-04-1928 Referring Provider (OT): Dr. Tracie Harrier   Encounter Date: 09/24/2021   OT End of Session - 09/24/21 1302     Visit Number 59    Number of Visits 87    Date for OT Re-Evaluation 10/06/21    Authorization Time Period Reporting period starting 06/18/2020    OT Start Time 1300    OT Stop Time 1345    OT Time Calculation (min) 45 min    Activity Tolerance Patient tolerated treatment well    Behavior During Therapy WFL for tasks assessed/performed             Past Medical History:  Diagnosis Date   Anemia    Cough    RESOLVING, NO FEVER   Diabetes mellitus without complication ([place])    Gout    Hypertension    Hypothyroidism    PUD (peptic ulcer disease)    Stroke [facility])    Wears dentures    full upper, partial lower    Past Surgical History:  Procedure Laterality Date   AMPUTATION TOE Left 12/14/2017   Procedure: AMPUTATION TOE-4TH MPJ;  Surgeon: Samara Deist, DPM;  Location: [facility];  Service: Podiatry;  Laterality: Left;  IVA LOCAL Diabetic - oral meds   APPENDECTOMY     CATARACT EXTRACTION W/PHACO Right 06/23/2015   Procedure: CATARACT EXTRACTION PHACO AND INTRAOCULAR LENS PLACEMENT (IOC);  Surgeon: Estill Cotta, MD;  Location: [facility];  Service: Ophthalmology;  Laterality: Right;  [place] 01:28 AP% 23.4 CDE 36.14 fluid pack lot # [id number]   CATARACT EXTRACTION W/PHACO Left 07/28/2015   Procedure: CATARACT EXTRACTION PHACO AND INTRAOCULAR LENS PLACEMENT (IOC);  Surgeon: Estill Cotta, MD;  Location: [facility];  Service: Ophthalmology;  Laterality: Left;  [place]    1:29.7 AP%  24.9 CDE   41.32 fluid casette lot [id number] H exp 09/23/2016   COONOSCOPY     AND ENDOSCOPY   DILATION AND  CURETTAGE OF UTERUS     ENDARTERECTOMY Left 11/13/2020   Procedure: Evacuation of left neck hematoma;  Surgeon: Katha Cabal, MD;  Location: [facility];  Service: Vascular;  Laterality: Left;   ENDARTERECTOMY Left 11/13/2020   Procedure: ENDARTERECTOMY CAROTID;  Surgeon: Algernon Huxley, MD;  Location: [facility];  Service: Vascular;  Laterality: Left;   TONSILLECTOMY     TUBAL LIGATION      There were no vitals filed for this visit.   Subjective Assessment - 09/24/21 1302     Subjective  Pt. reports that she is feeling better today    Patient is accompanied by: Family member    Pertinent History R ankle pain, gout, T2DM, HTN, CKDIII; R/L carotid endarectomy    Currently in Pain? No/denies            OT TREATMENT     Therapeutic Ex.:   Pt. worked on pinch strengthening in the right hand for lateral, and 3pt. pinch using yellow, red, green, and blue resistive clips. Pt. worked on placing the clips at various horizontal angles, with emphasis placed on precision, and accuracy with reaching the target. Pt. worked on translatory movements moving the clips form the lateral pinch position to the 3pt. pinch position. Visual, tactile and verbal cues were required consistent visual cues, and tactile cues for  eliciting the desired movement.    Neuro muscular re-education:   Pt. worked on right hand [facility] skills grasping extra small flat cards 1/4" by 1/2" in size. Pt. worked on sliding them off the edge of a tabletop platform with her 2nd digit to her thumb. Pt. worked on flipping the cards, and stacking them. Pt. Worked on modulation of graded pressure in the right hand alternating light, with heavy objects.    Pt. reports that she notices the she continues to over grip items, and uses too much force when grasping for light objects. Pt. continues to make progress with the accuracy of reaching for targets, and has been using her hand more during tasks at home. Pt. continues to have difficulty with  translatory movements moving the objects through her hand. Pt. initially had difficulty with refining the movements needed to grasp, manipulate the small flat objects, and flip them. As the task progressed, and the position of the cards were modified. Pt. continues to have difficulty with performing translatroy movements, and requires visual cues for motor planning through the moving the clips from one position to the next. Pt. required extensive verbal, visual, and tactile cues for motor planning through the movements. Pt. continues to work on modulating graded flexion to help grip items, and perform UE strength, motor control, [facility] skills in order to improve UE functioning during ADL, and IADL skills.                               OT Education - 09/24/21 1302     Education Details RUE functioning.    Person(s) Educated Patient    Methods Explanation;Verbal cues;Demonstration;Tactile cues    Comprehension Verbalized understanding;Verbal cues required;Returned demonstration              OT Short Term Goals - 03/23/21 1657       OT SHORT TERM GOAL #1   Title Pt will perform HEP for RUE strength/coordination independently.    Baseline Eval: HEP not yet established in outpatient, but pt is working with putty from [name]; 12/04/2020; [facility] HEP initiated this day with mod vc after return demo; 01/05/2021: indep with currently program, but ongoing as pt progresses; 01/28/2021: vc to revisit and focus on grip and pinch strengthening with putty; 03/23/21: pt is indep with HEP    Time 6    Period Weeks    Status Achieved    Target Date 03/23/21               OT Long Term Goals - 09/03/21 1313       OT LONG TERM GOAL #1   Title Pt will improve hand writing to 100% legibility with R hand to be able to independently write a check.    Baseline Eval: signature is 75% legible with R hand, requiring extra time; 12/04/2020: no chan to complete.ge from eval; 01/05/2021: Printing is  100% legible, signature is 90% legible, but still requires extra time and effort.; 01/26/21: Signature is 90% legible and speed/fluidity have improved. 20th visit: 90% legible, 03/23/21: 90% legible, extra time 40th visit 90 % legibility with increased time, 50th visit: 90% legible with with increased time required 07/14/2021: 90% legibility. 07/30/2021: 90% legibility, pt. presses too hard through the pen, and pencil. 09/03/2021: 90% legibility    Time 12    Period Weeks    Status On-going    Target Date 10/06/21      OT  LONG TERM GOAL #2   Title Pt will improve R hand coordination to enable good manipulation of iphone using R dominant hand      OT LONG TERM GOAL #3   Title Pt will improve GMC throughout RUE to enable pt to reach for and pick up ADL supplies with R dominant hand without dropping or knocking over objects.    Baseline Eval: Pt reports RUE is clumsy and easily knocks over objects when trying to reach for ADL supplies.  Pt verbalizes that her "aim is off."; 12/04/2020: pt reports slight improvement since eval and has started using her R hand to eat, but still feels clumsy; 01/05/2021: Greatly improved; pt is using silverware to eat with R hand, can comb hair with RUE, still mildly ataxic; 01/26/21: pt reports difficulty picking up objects within a small space (ie; may knock the toothpaste holder down when reaching for toothbrush), but reaching for light/larger objects is improving 20th visit: pt. continues to be mildly ataxic, however is consistently improving with motor control, and [place] while reaching targets; 03/23/21: Pt reaches with accuracy towards targets if she takes her time (pt drops and knocks things over when moving at a faster/normal pace) 04/23/2021: Pt. continues to present with difficulty with depth perception, and impaired [place].40th visit: Pt. is improving, however tends to knock over lighter weight objects.50th visit: Pt. has improved with reaching for items without knocking them  over, however continues to need work on accuracy.07/14/2021: Pt. is improving while motor control, and accuracy of reaching for objects. Pt. continues to work towards improving efficiency with reaching for objects. 07/30/2021: Pt. is able to able to reach for objects more accurately, however continues to present with limited motor control in the Right. 09/03/2021: Pt. Pt. is improving with reaching up without knocking items over. pt. is now able to stand at the sink and reach up without dropping items.    Time 12    Period Weeks    Status On-going    Target Date 10/06/21      OT LONG TERM GOAL #4   Title Pt will improve FOTO score to 68 or better to indicate measurable functional improvement.    Baseline Eval: FOTO 55; 01/05/2021: FOTO 61; 01/28/21: FOTO 61, 20th visit: 61; 03/23/21: FOTO 60 4oth visit: FOTO score: 61, 50th visit: FOTO 69 07/30/2021: FOTO 71  09/03/2021: FOTO 73    Time 12    Period Weeks    Status On-going    Target Date 10/06/21      OT LONG TERM GOAL #5   Time 12      OT LONG TERM GOAL #6   Title Pt. will improve right hand [facility] skills to be able to indepedendently manipulate and set-up her medication/pills in the pillbox.    Baseline 07/14/2021: Pt. has difficulty manipulating small pills, and often drops them. 07/30/2021: Pt. continues to have difficulty grasping pills/medication for set-up of pillbox. 09/03/2021: Pt. is able to grasp pills from the pillbox without dropping them.    Time 12    Period Weeks    Status New    Target Date 10/06/21                   Plan - 09/24/21 1303     Clinical Impression Statement Pt. reports that she notices the she continues to over grip items, and uses too much force when grasping for light objects. Pt. continues to make progress with the accuracy of reaching for targets, and  has been using her hand more during tasks at home. Pt. continues to have difficulty with translatory movements moving the objects through her hand. Pt.  initially had difficulty with refining the movements needed to grasp, manipulate the small flat objects, and flip them. As the task progressed, and the position of the cards were modified. Pt. continues to have difficulty with performing translatroy movements, and requires visual cues for motor planning through the moving the clips from one position to the next. Pt. required extensive verbal, visual, and tactile cues for motor planning through the movements. Pt. continues to work on modulating graded flexion to help grip items, and perform UE strength, motor control, [facility] skills in order to improve UE functioning during ADL, and IADL skills.          OT Occupational Profile and History Detailed Assessment- Review of Records and additional review of physical, cognitive, psychosocial history related to current functional performance    Occupational performance deficits (Please refer to evaluation for details): ADL's;Leisure;IADL's    Body Structure / Function / Physical Skills ADL;Coordination;Endurance;GMC;UE functional use;Balance;Sensation;Body mechanics;IADL;Pain;Dexterity;FMC;Strength;Gait;Mobility    Rehab Potential Good    Clinical Decision Making Several treatment options, min-mod task modification necessary    Comorbidities Affecting Occupational Performance: May have comorbidities impacting occupational performance    Modification or Assistance to Complete Evaluation  Min-Moderate modification of tasks or assist with assess necessary to complete eval    OT Frequency 2x / week    OT Duration 12 weeks    OT Treatment/Interventions Self-care/ADL training;Therapeutic exercise;DME and/or AE instruction;Functional Mobility Training;Balance training;Neuromuscular education;Therapeutic activities;Patient/family education    Consulted and Agree with Plan of Care Patient             Patient will benefit from skilled therapeutic intervention in order to improve the following deficits and  impairments:   Body Structure / Function / Physical Skills: ADL, Coordination, Endurance, GMC, UE functional use, Balance, Sensation, Body mechanics, IADL, Pain, Dexterity, FMC, Strength, Gait, Mobility       Visit Diagnosis: Muscle weakness (generalized)  Other lack of coordination    Problem List Patient Active Problem List   Diagnosis Date Noted   Carotid stenosis, symptomatic, with infarction ([place]) 11/13/2020   Gout flare 11/10/2020   UTI (urinary tract infection) 11/10/2020   Left carotid artery stenosis 11/10/2020   Chronic kidney disease 11/10/2020   Left basal ganglia embolic stroke ([place]) [id number]   PAC (premature atrial contraction)    Pre-procedural cardiovascular examination    Ischemic stroke ([place]) 10/20/2020   TIA (transient ischemic attack) 10/19/2020   Right hemiparesis ([place]) 10/19/2020   Type 2 diabetes mellitus with stage 3 chronic kidney disease, without long-term current use of insulin ([address]) 08/24/2018   Hyperlipidemia, unspecified 07/19/2017   Hypertension 07/19/2017   PUD (peptic ulcer disease) 07/19/2017   Type 2 diabetes mellitus ([place]) 07/19/2017   Personal history of gout 08/12/2016   Anemia, unspecified 04/09/2016   Hypothyroidism, acquired 12/10/2015   Harrel Carina, MS, OTR/L   Harrel Carina, OT 09/24/2021, 1:33 PM  [facility] MAIN [facility] SERVICES [address] [place], [place], [address] Phone: [phone]   Fax:  [phone]  Name: Cassandra Schaefer MRN: [id number] Date of Birth: [birth year]

## 2021-09-24 NOTE — Therapy (Signed)
[place] MAIN [facility] SERVICES [place], [place], [address] Phone: [phone]   Fax:  [phone]  Physical Therapy Treatment  Patient Details  Name: Cassandra Schaefer MRN: [id number] Date of Birth: 1928-11-04 Referring Provider (PT): Dr. Dew/Dr. Ginette Pitman PCP   Encounter Date: 09/24/2021   PT End of Session - 09/24/21 1350     Visit Number 44    Number of Visits 64    Date for PT Re-Evaluation 10/20/21    Authorization Type [employer]    PT Start Time 1348    PT Stop Time 1428    PT Time Calculation (min) 40 min    Equipment Utilized During Treatment Gait belt    Activity Tolerance Patient tolerated treatment well    Behavior During Therapy WFL for tasks assessed/performed             Past Medical History:  Diagnosis Date   Anemia    Cough    RESOLVING, NO FEVER   Diabetes mellitus without complication ([place])    Gout    Hypertension    Hypothyroidism    PUD (peptic ulcer disease)    Stroke [facility])    Wears dentures    full upper, partial lower    Past Surgical History:  Procedure Laterality Date   AMPUTATION TOE Left 12/14/2017   Procedure: AMPUTATION TOE-4TH MPJ;  Surgeon: Samara Deist, DPM;  Location: [place];  Service: Podiatry;  Laterality: Left;  IVA LOCAL Diabetic - oral meds   APPENDECTOMY     CATARACT EXTRACTION W/PHACO Right 06/23/2015   Procedure: CATARACT EXTRACTION PHACO AND INTRAOCULAR LENS PLACEMENT (IOC);  Surgeon: Estill Cotta, MD;  Location: [facility];  Service: Ophthalmology;  Laterality: Right;  [place] 01:28 AP% 23.4 CDE 36.14 fluid pack lot # [id number]   CATARACT EXTRACTION W/PHACO Left 07/28/2015   Procedure: CATARACT EXTRACTION PHACO AND INTRAOCULAR LENS PLACEMENT (IOC);  Surgeon: Estill Cotta, MD;  Location: [facility];  Service: Ophthalmology;  Laterality: Left;  [place]    1:29.7 AP%  24.9 CDE   41.32 fluid casette lot #[id number] H exp 09/23/2016   COONOSCOPY     AND ENDOSCOPY    DILATION AND CURETTAGE OF UTERUS     ENDARTERECTOMY Left 11/13/2020   Procedure: Evacuation of left neck hematoma;  Surgeon: Katha Cabal, MD;  Location: [facility];  Service: Vascular;  Laterality: Left;   ENDARTERECTOMY Left 11/13/2020   Procedure: ENDARTERECTOMY CAROTID;  Surgeon: Algernon Huxley, MD;  Location: [facility];  Service: Vascular;  Laterality: Left;   TONSILLECTOMY     TUBAL LIGATION      There were no vitals filed for this visit.   Subjective Assessment - 09/24/21 1350     Subjective Patient reports doing well. No pain and minimal fatigue;    Patient is accompained by: --   Daughter, [name]   Pertinent History 88 y.o. female with medical history significant for type 2 diabetes mellitus, essential hypertension, acquired hypothyroidism, hyperlipidemia, stage III chronic kidney disease reports increased right sided weakness on 10/19/20. She was diagnosed with left basal ganglia ischemic stroke. She was discharged to inpatient rehab for 10 days and then discharged home. Patient was evaluated by outpatient PT on 11/12/20. CVA was due to carotid stenosis. She underwent left carotid endartectomy on 7/21. Procedure went well however patient suffered from left cervical hematoma and had subsequent surgical procedure to stop the bleed and remove the hematoma on 11/13/20. They were concerned with her breathing and  therefore intubated her for 1 day, she was in ICU for 2 days and transferred to med/surge on 11/15/20. Patient was discharged home on 11/17/20 and is now returning to outpatient PT. She has had the stiches removed and is healing well. Denies any neck pain or stiffness. She lives with her daughter and has 24/7 caregiver support. She is still using RW for most ambulation. She is able to transfer to bedside commode well and is able to stand short time unsupported. She reports feeling very weak and fatigued. She reports she has been dragging her right foot more. She denies any numbness/tingling; She  reports feeling like her legs are wanting to do more but is hesitant because of fear of falling. She is mod I for self care morning routine. She does still receive help with showering. She has been working on increasing her activity but denies any formal exercise.    Limitations Standing;Walking    How long can you sit comfortably? NA    How long can you stand comfortably? 30-60 min with support    How long can you walk comfortably? about 100 feet with RW, still limited to short distances;    Diagnostic tests MRI shows left basal ganglia infarct 10/21/20    Patient Stated Goals "Improve balance and walking and not need RW, get back to independent."    Currently in Pain? No/denies    Pain Onset In the past 7 days                 TREATMENT: Gait on treadmill 0.8-1.0 mph with 2 HHA x2 min 45 sec (0.3 miles total) with moderate challenge, increased HR to 102 bpm   Walking on firm surface x80 feet x1 laps with min A for safety;   Exercise: Forward step ups on 6 inch step with 1 rail assist x10 reps each LE;    Seated: Hamstring curl green tband x15 reps each LE, decreased ROM on RLE due to weakness;     NMR Standing on airex pad: -alternate toe taps to 6 inch step without rail assist x10 reps each -Standing one foot on airex and one foot on 6 inch step:  Unsupported 30 sec hold x1 rep each;   Walking on firm surface Forward walking with high knee march x20 feet, min a for safety, moderate difficulty;   backward  walking x20 feet, pt able to exhibit better reciprocal pattern with less narrow base of support.  Pt required CGA for safety;    Standing on airex beam: -side stepping x4 laps each direction with finger tip hold -tandem stance 10 sec hold unsupported x2 reps each foot in front  Progressed to unsupported standing with head turns side/side x3 reps, head turns up/down x3 reps;    Stepping over 1/2 bolster with single foot forward/backward x5 reps unsupported x1 set each  LE with CGA for safety;  4 square stepping clockwise x3 laps, counterclockwise x3 laps each;  Required min A for safety;     Tolerated session well. Reports minimal  fatigue but denies any soreness or pain;                                    PT Education - 09/24/21 1350     Education Details gait safety/positioning;    Person(s) Educated Patient    Methods Explanation;Verbal cues    Comprehension Verbalized understanding;Returned demonstration;Verbal cues required;Need further instruction  PT Short Term Goals - 09/10/21 1354       PT SHORT TERM GOAL #1   Title Patient will be adherent to HEP at least 3x a week to improve functional strength and balance for better safety at home.    Baseline 9/21: Pt reports compliance with HEP and is confident, 1/3: doing exercise 2x a week; 06/25/21: 3x/week, 5/18: consistent;    Time 4    Period Weeks    Status Achieved    Target Date 06/25/21      PT SHORT TERM GOAL #2   Title Patient will be independent in transferring sit<>Stand without pushing on arm rests to improve ability to get up from chair.    Baseline 9/21: pt able to complete STS without use of UEs, 1/3:able to stand but has moderate difficulty requiring increased time; 06/25/21: able to perform indep without hands but requiring use of momentum to attain standing. Remains stable. 5/18: able to stand without UE assist, close supervision, remains stable;    Time 4    Period Weeks    Status Achieved    Target Date 07/23/21               PT Long Term Goals - 09/10/21 1357       PT LONG TERM GOAL #1   Title Patient (> 33 years old) will complete five times sit to stand test in < 15 seconds indicating an increased LE strength and improved balance.    Baseline 9/21: 29 seconds hands-free 10/10: deferred 10/12: 34 sec hands free; 11/2: 23.8 sec hands-free; 12/7: 33.2 sec hands free, 1/3: 33.92 hands free; 06/25/21: 25.69 sec, 4/13: 15.5 sec  hands free, 5/18: 23 sec without UE    Time 8    Period Weeks    Status Partially Met    Target Date 10/20/21      PT LONG TERM GOAL #2   Title Patient will increase six minute walk test distance to >400 feet for improve gait ability    Baseline 9/21: 368 ft with RW; 10/10: deferred 10/12: 408 ft; 11/2: 476 ft with 4WW, 1/3: 475 feet with RW; 4/13: 450 feet without AD CGA    Time 8    Period Weeks    Status Achieved    Target Date 10/20/21      PT LONG TERM GOAL #3   Title Patient will increase 10 meter walk test to >1.81ms as to improve gait speed for better community ambulation and to reduce fall risk.    Baseline 9/21: 0.5 m/s with RW; 10/10 deferred 10/12: 0.93 m/s with 44ZY 11/2: 0.83 m/s with 46AY 12/7: 0.52 m/s with RW, 1/3: 0.704; 06/25/21: 0.55 m/s with SPC. 5/18: 0.4 m/s without AD    Time 8    Period Weeks    Status On-going    Target Date 10/20/21      PT LONG TERM GOAL #4   Title Patient will be independent with ascend/descend 6 steps using single UE in step over step pattern without LOB.    Baseline 9/21:  Patient uses PT stairs (4 steps) with bilateral upper extremity support on handrails for both ascending/descending.  Patient exhibits reciprocal pattern ascending and step to pattern descending. 10/10: deferred; 10/12: ascending/descending recip. steps with BUE support; 11/2: Ascended/descended 8 steps total, reciprocal pattern ascending and step-to descending. Pt uses UUE support throughout with CGA assist.; 12/7: Ascended and descended 8 steps total with UUE support and CGA. Reciprocal stepping ascending and step  to descending., 1/3: Deferrred; 06/25/21: able to perform with SUE support and reciprocal pattern asc/desc, but does require close CGA with descending. 5/18: independent with 2 handrails, close supervision with 1 handrai;    Time 8    Period Weeks    Status Partially Met    Target Date 10/20/21      PT LONG TERM GOAL #5   Title Patient will demonstrate an  improved Berg Balance Score of >45/56 as to demonstrate improved balance with ADLs such as sitting/standing and transfer balance and reduced fall risk.    Baseline 9/21: deferred d/t time; 9/29: 42/56; 10/10: deferred; 10/12: 44/56; 11/2: deferred; 11/7: 46/56, 1/3: deferred    Time 8    Period Weeks    Status Achieved    Target Date 10/20/21      PT LONG TERM GOAL #6   Title Patient will improve FOTO score to >60% to indicate improved functional mobility with ADLs.    Baseline 9/21: 59; 10/10: 57; 11/2: 60; 12/7: 58%, 1/3: 61%, 4/13: 56%    Time 8    Period Weeks    Status On-going    Target Date 10/20/21      PT LONG TERM GOAL #7   Title Pt will improve FGA by a minimum of 5 points to indicate clinically significant improvement in reduced risk of falls with walking tasks.    Baseline 06/25/21: 12, 4/13: deferred, 5/18: deferred due to increased ankle discomfort;    Time 8    Period Weeks    Status New    Target Date 10/20/21                   Plan - 09/24/21 1429     Clinical Impression Statement Patient motivated and participated well within session. she was instructed in advanced LE strengthening and balance exercise. Progressed exercise with reduced rail assist to facilitate increased weight bearing in BLE and increase LE strengthening. Patient does require CGA to min A throughout session for safety. She does report increased fatigue at end of session. She had 1 episode of dizziness. Vitals assessed, HR in mid 80's, SPo2 100%. she would benefit from additional skilled PT Intervention to improve strength, balance and mobility;    Personal Factors and Comorbidities Age    Comorbidities HTN, CKD, fall risk, type 2 diabetes,    Examination-Activity Limitations Lift;Locomotion Level;Squat;Stairs;Stand;Toileting;Transfers;Bathing    Examination-Participation Restrictions Cleaning;Community Activity;Laundry;Meal Prep;Shop;Volunteer;Driving    Stability/Clinical Decision Making  Stable/Uncomplicated    Rehab Potential Fair    PT Frequency 2x / week    PT Duration 8 weeks    PT Treatment/Interventions Cryotherapy;Electrical Stimulation;Moist Heat;Gait training;Stair training;Functional mobility training;Therapeutic activities;Therapeutic exercise;Neuromuscular re-education;Balance training;Patient/family education;Orthotic Fit/Training;Energy conservation    PT Next Visit Plan work on LE strengthening and balance; gait training, endurance, continue POC as previously indicated    PT Home Exercise Plan Access Code: [email or web address]; no updates    Consulted and Agree with Plan of Care Patient             Patient will benefit from skilled therapeutic intervention in order to improve the following deficits and impairments:  Abnormal gait, Decreased balance, Decreased endurance, Decreased mobility, Difficulty walking, Cardiopulmonary status limiting activity, Decreased activity tolerance, Decreased coordination, Decreased strength  Visit Diagnosis: Muscle weakness (generalized)  Other lack of coordination  Difficulty in walking, not elsewhere classified  Unsteadiness on feet  Other abnormalities of gait and mobility  Apraxia  Abnormality of gait and mobility  Problem List Patient Active Problem List   Diagnosis Date Noted   Carotid stenosis, symptomatic, with infarction ([place]) 11/13/2020   Gout flare 11/10/2020   UTI (urinary tract infection) 11/10/2020   Left carotid artery stenosis 11/10/2020   Chronic kidney disease 11/10/2020   Left basal ganglia embolic stroke ([place]) 37/07/8887   PAC (premature atrial contraction)    Pre-procedural cardiovascular examination    Ischemic stroke ([place]) 10/20/2020   TIA (transient ischemic attack) 10/19/2020   Right hemiparesis ([place]) 10/19/2020   Type 2 diabetes mellitus with stage 3 chronic kidney disease, without long-term current use of insulin ([place]) 08/24/2018   Hyperlipidemia, unspecified 07/19/2017   Hypertension  07/19/2017   PUD (peptic ulcer disease) 07/19/2017   Type 2 diabetes mellitus ([place]) 07/19/2017   Personal history of gout 08/12/2016   Anemia, unspecified 04/09/2016   Hypothyroidism, acquired 12/10/2015    Jamil Armwood, PT, DPT 09/24/2021, 2:31 PM  [place] MAIN [facility] SERVICES [place], [place], [address] Phone: [phone]   Fax:  [phone]  Name: Cassandra Schaefer MRN: [id number] Date of Birth: 09-Sep-1928

## 2021-09-29 ENCOUNTER — Encounter: Payer: Self-pay | Admitting: Physical Therapy

## 2021-09-29 ENCOUNTER — Encounter: Payer: Self-pay | Admitting: Occupational Therapy

## 2021-09-29 ENCOUNTER — Encounter (INDEPENDENT_AMBULATORY_CARE_PROVIDER_SITE_OTHER): Payer: Medicare HMO

## 2021-09-29 ENCOUNTER — Ambulatory Visit: Payer: Medicare HMO | Admitting: Physical Therapy

## 2021-09-29 ENCOUNTER — Ambulatory Visit: Payer: Medicare HMO | Admitting: Occupational Therapy

## 2021-09-29 ENCOUNTER — Ambulatory Visit (INDEPENDENT_AMBULATORY_CARE_PROVIDER_SITE_OTHER): Payer: Medicare HMO | Admitting: Vascular Surgery

## 2021-09-29 DIAGNOSIS — R482 Apraxia: Secondary | ICD-10-CM

## 2021-09-29 DIAGNOSIS — M6281 Muscle weakness (generalized): Secondary | ICD-10-CM

## 2021-09-29 DIAGNOSIS — R262 Difficulty in walking, not elsewhere classified: Secondary | ICD-10-CM

## 2021-09-29 DIAGNOSIS — R269 Unspecified abnormalities of gait and mobility: Secondary | ICD-10-CM

## 2021-09-29 DIAGNOSIS — R2689 Other abnormalities of gait and mobility: Secondary | ICD-10-CM

## 2021-09-29 DIAGNOSIS — R2681 Unsteadiness on feet: Secondary | ICD-10-CM

## 2021-09-29 DIAGNOSIS — R278 Other lack of coordination: Secondary | ICD-10-CM

## 2021-09-29 NOTE — Therapy (Signed)
[place] MAIN [facility] SERVICES [place], [place], [address] Phone: [phone]   Fax:  [phone]  Physical Therapy Treatment  Patient Details  Name: Cassandra Schaefer MRN: [id number] Date of Birth: 1929-01-12 Referring Provider (PT): Dr. Dew/Dr. Ginette Pitman PCP   Encounter Date: 09/29/2021   PT End of Session - 09/29/21 1105     Visit Number 28    Number of Visits 53    Date for PT Re-Evaluation 10/20/21    Authorization Type [employer]    PT Start Time 1102    PT Stop Time 1143    PT Time Calculation (min) 41 min    Equipment Utilized During Treatment Gait belt    Activity Tolerance Patient tolerated treatment well    Behavior During Therapy WFL for tasks assessed/performed             Past Medical History:  Diagnosis Date   Anemia    Cough    RESOLVING, NO FEVER   Diabetes mellitus without complication ([place])    Gout    Hypertension    Hypothyroidism    PUD (peptic ulcer disease)    Stroke [facility])    Wears dentures    full upper, partial lower    Past Surgical History:  Procedure Laterality Date   AMPUTATION TOE Left 12/14/2017   Procedure: AMPUTATION TOE-4TH MPJ;  Surgeon: Samara Deist, DPM;  Location: [place];  Service: Podiatry;  Laterality: Left;  IVA LOCAL Diabetic - oral meds   APPENDECTOMY     CATARACT EXTRACTION W/PHACO Right 06/23/2015   Procedure: CATARACT EXTRACTION PHACO AND INTRAOCULAR LENS PLACEMENT (IOC);  Surgeon: Estill Cotta, MD;  Location: [facility];  Service: Ophthalmology;  Laterality: Right;  [place] 01:28 AP% 23.4 CDE 36.14 fluid pack lot # [id number]   CATARACT EXTRACTION W/PHACO Left 07/28/2015   Procedure: CATARACT EXTRACTION PHACO AND INTRAOCULAR LENS PLACEMENT (IOC);  Surgeon: Estill Cotta, MD;  Location: [facility];  Service: Ophthalmology;  Laterality: Left;  [place]    1:29.7 AP%  24.9 CDE   41.32 fluid casette lot #[id number] H exp 09/23/2016   COONOSCOPY     AND ENDOSCOPY    DILATION AND CURETTAGE OF UTERUS     ENDARTERECTOMY Left 11/13/2020   Procedure: Evacuation of left neck hematoma;  Surgeon: Katha Cabal, MD;  Location: [facility];  Service: Vascular;  Laterality: Left;   ENDARTERECTOMY Left 11/13/2020   Procedure: ENDARTERECTOMY CAROTID;  Surgeon: Algernon Huxley, MD;  Location: [facility];  Service: Vascular;  Laterality: Left;   TONSILLECTOMY     TUBAL LIGATION      There were no vitals filed for this visit.   Subjective Assessment - 09/29/21 1105     Subjective Patient reports doing well. No new falls or stumbles. Reports being outside this weekend with good weather;    Patient is accompained by: --   Daughter, [name]   Pertinent History 88 y.o. female with medical history significant for type 2 diabetes mellitus, essential hypertension, acquired hypothyroidism, hyperlipidemia, stage III chronic kidney disease reports increased right sided weakness on 10/19/20. She was diagnosed with left basal ganglia ischemic stroke. She was discharged to inpatient rehab for 10 days and then discharged home. Patient was evaluated by outpatient PT on 11/12/20. CVA was due to carotid stenosis. She underwent left carotid endartectomy on 7/21. Procedure went well however patient suffered from left cervical hematoma and had subsequent surgical procedure to stop the bleed and remove the hematoma on  11/13/20. They were concerned with her breathing and therefore intubated her for 1 day, she was in ICU for 2 days and transferred to med/surge on 11/15/20. Patient was discharged home on 11/17/20 and is now returning to outpatient PT. She has had the stiches removed and is healing well. Denies any neck pain or stiffness. She lives with her daughter and has 24/7 caregiver support. She is still using RW for most ambulation. She is able to transfer to bedside commode well and is able to stand short time unsupported. She reports feeling very weak and fatigued. She reports she has been dragging her  right foot more. She denies any numbness/tingling; She reports feeling like her legs are wanting to do more but is hesitant because of fear of falling. She is mod I for self care morning routine. She does still receive help with showering. She has been working on increasing her activity but denies any formal exercise.    Limitations Standing;Walking    How long can you sit comfortably? NA    How long can you stand comfortably? 30-60 min with support    How long can you walk comfortably? about 100 feet with RW, still limited to short distances;    Diagnostic tests MRI shows left basal ganglia infarct 10/21/20    Patient Stated Goals "Improve balance and walking and not need RW, get back to independent."    Currently in Pain? No/denies    Pain Onset In the past 7 days    Multiple Pain Sites No                 TREATMENT: Warm up on Nustep, BUE/BLE level 3-5 (interval training), x4 min with cues for steps per minute >65 to challenge cardiovascular conditioning; Patient reports moderate difficulty especially increased resistance; Vitals after exercise, SPo2 100%, HR 108 bpm;  Exercise: Leg press: BLE 40# x15, x12 Tolerated well, reports moderate difficulty but no pain. Reports increased fatigue during 2nd set being unable to complete 15 reps; Patient does require min VCs to slow down LE movement for better motor control;   Seated: Hamstring curl green tband x15 reps each LE, decreased ROM on RLE due to weakness;     NMR Standing on airex pad: -alternate toe taps to 6 inch step without rail assist x15 reps each -Standing one foot on airex and one foot on 6 inch step:             Unsupported 30 sec hold x1 rep each;  Progressed to head turns side/side x5 reps;     Instructed patient in obstacle course: Walking with tripod base cane in LUE: Stepping over 1/2 bolster, stepping up and over airex pad, stepping up/over 4 inch step, weaving around cones #6 x2 sets with CGA to min A for  safety;  Pt had a harder time weaving around cones requiring increased time/effort for sequencing with cane and positioning;     Walking on firm surface x180 feet x1 laps with tripod base cane in LUE with CGA for safety, patient required cues for sequencing and correct gait mechanics. She exhibits less unsteadiness today compared to previous sessions with better sequencing and cane use;     Tolerated session well. Reports minimal  fatigue but denies any soreness or pain;                                  PT Education - 09/29/21  63     Education Details gait safety/positioning;    Person(s) Educated Patient    Methods Explanation;Verbal cues    Comprehension Verbalized understanding;Returned demonstration;Verbal cues required;Need further instruction              PT Short Term Goals - 09/10/21 1354       PT SHORT TERM GOAL #1   Title Patient will be adherent to HEP at least 3x a week to improve functional strength and balance for better safety at home.    Baseline 9/21: Pt reports compliance with HEP and is confident, 1/3: doing exercise 2x a week; 06/25/21: 3x/week, 5/18: consistent;    Time 4    Period Weeks    Status Achieved    Target Date 06/25/21      PT SHORT TERM GOAL #2   Title Patient will be independent in transferring sit<>Stand without pushing on arm rests to improve ability to get up from chair.    Baseline 9/21: pt able to complete STS without use of UEs, 1/3:able to stand but has moderate difficulty requiring increased time; 06/25/21: able to perform indep without hands but requiring use of momentum to attain standing. Remains stable. 5/18: able to stand without UE assist, close supervision, remains stable;    Time 4    Period Weeks    Status Achieved    Target Date 07/23/21               PT Long Term Goals - 09/10/21 1357       PT LONG TERM GOAL #1   Title Patient (> 68 years old) will complete five times sit to stand test in <  15 seconds indicating an increased LE strength and improved balance.    Baseline 9/21: 29 seconds hands-free 10/10: deferred 10/12: 34 sec hands free; 11/2: 23.8 sec hands-free; 12/7: 33.2 sec hands free, 1/3: 33.92 hands free; 06/25/21: 25.69 sec, 4/13: 15.5 sec hands free, 5/18: 23 sec without UE    Time 8    Period Weeks    Status Partially Met    Target Date 10/20/21      PT LONG TERM GOAL #2   Title Patient will increase six minute walk test distance to >400 feet for improve gait ability    Baseline 9/21: 368 ft with RW; 10/10: deferred 10/12: 408 ft; 11/2: 476 ft with 4WW, 1/3: 475 feet with RW; 4/13: 450 feet without AD CGA    Time 8    Period Weeks    Status Achieved    Target Date 10/20/21      PT LONG TERM GOAL #3   Title Patient will increase 10 meter walk test to >1.23ms as to improve gait speed for better community ambulation and to reduce fall risk.    Baseline 9/21: 0.5 m/s with RW; 10/10 deferred 10/12: 0.93 m/s with 42IO 11/2: 0.83 m/s with 40BT 12/7: 0.52 m/s with RW, 1/3: 0.704; 06/25/21: 0.55 m/s with SPC. 5/18: 0.4 m/s without AD    Time 8    Period Weeks    Status On-going    Target Date 10/20/21      PT LONG TERM GOAL #4   Title Patient will be independent with ascend/descend 6 steps using single UE in step over step pattern without LOB.    Baseline 9/21:  Patient uses PT stairs (4 steps) with bilateral upper extremity support on handrails for both ascending/descending.  Patient exhibits reciprocal pattern ascending and step to pattern descending.  10/10: deferred; 10/12: ascending/descending recip. steps with BUE support; 11/2: Ascended/descended 8 steps total, reciprocal pattern ascending and step-to descending. Pt uses UUE support throughout with CGA assist.; 12/7: Ascended and descended 8 steps total with UUE support and CGA. Reciprocal stepping ascending and step to descending., 1/3: Deferrred; 06/25/21: able to perform with SUE support and reciprocal pattern asc/desc,  but does require close CGA with descending. 5/18: independent with 2 handrails, close supervision with 1 handrai;    Time 8    Period Weeks    Status Partially Met    Target Date 10/20/21      PT LONG TERM GOAL #5   Title Patient will demonstrate an improved Berg Balance Score of >45/56 as to demonstrate improved balance with ADLs such as sitting/standing and transfer balance and reduced fall risk.    Baseline 9/21: deferred d/t time; 9/29: 42/56; 10/10: deferred; 10/12: 44/56; 11/2: deferred; 11/7: 46/56, 1/3: deferred    Time 8    Period Weeks    Status Achieved    Target Date 10/20/21      PT LONG TERM GOAL #6   Title Patient will improve FOTO score to >60% to indicate improved functional mobility with ADLs.    Baseline 9/21: 59; 10/10: 57; 11/2: 60; 12/7: 58%, 1/3: 61%, 4/13: 56%    Time 8    Period Weeks    Status On-going    Target Date 10/20/21      PT LONG TERM GOAL #7   Title Pt will improve FGA by a minimum of 5 points to indicate clinically significant improvement in reduced risk of falls with walking tasks.    Baseline 06/25/21: 12, 4/13: deferred, 5/18: deferred due to increased ankle discomfort;    Time 8    Period Weeks    Status New    Target Date 10/20/21                   Plan - 09/29/21 1114     Clinical Impression Statement Patient motivated and participated well within session. She presents to therapy with cane. Educated patient in proper cane use with gait. She exhibits better sequencing and coordination using cane in LUE compared to previous sessions. She does require CGA for safety. Progressed LE strengthening with increased repetition. Patient does require min VCs for proper exercise technique/positioning. She reports moderate difficulty with exercise. Utilized airex pad to challenge stance control. Patient does require CGA when doing toe taps unsupported. She exhibits less unsteadiness with static standing balance compared to dynamic balance. She  continues to have intermittent RLE foot drag. She would benefit from additional skilled PT intervention to improve strength, balance and mobility;    Personal Factors and Comorbidities Age    Comorbidities HTN, CKD, fall risk, type 2 diabetes,    Examination-Activity Limitations Lift;Locomotion Level;Squat;Stairs;Stand;Toileting;Transfers;Bathing    Examination-Participation Restrictions Cleaning;Community Activity;Laundry;Meal Prep;Shop;Volunteer;Driving    Stability/Clinical Decision Making Stable/Uncomplicated    Rehab Potential Fair    PT Frequency 2x / week    PT Duration 8 weeks    PT Treatment/Interventions Cryotherapy;Electrical Stimulation;Moist Heat;Gait training;Stair training;Functional mobility training;Therapeutic activities;Therapeutic exercise;Neuromuscular re-education;Balance training;Patient/family education;Orthotic Fit/Training;Energy conservation    PT Next Visit Plan work on LE strengthening and balance; gait training, endurance, continue POC as previously indicated    PT Home Exercise Plan Access Code: [email or web address]; no updates    Consulted and Agree with Plan of Care Patient             Patient will benefit from  skilled therapeutic intervention in order to improve the following deficits and impairments:  Abnormal gait, Decreased balance, Decreased endurance, Decreased mobility, Difficulty walking, Cardiopulmonary status limiting activity, Decreased activity tolerance, Decreased coordination, Decreased strength  Visit Diagnosis: Muscle weakness (generalized)  Other lack of coordination  Difficulty in walking, not elsewhere classified  Unsteadiness on feet  Other abnormalities of gait and mobility  Apraxia  Abnormality of gait and mobility     Problem List Patient Active Problem List   Diagnosis Date Noted   Carotid stenosis, symptomatic, with infarction ([place]) 11/13/2020   Gout flare 11/10/2020   UTI (urinary tract infection) 11/10/2020   Left carotid  artery stenosis 11/10/2020   Chronic kidney disease 11/10/2020   Left basal ganglia embolic stroke ([place]) 27/06/5007   PAC (premature atrial contraction)    Pre-procedural cardiovascular examination    Ischemic stroke ([place]) 10/20/2020   TIA (transient ischemic attack) 10/19/2020   Right hemiparesis ([place]) 10/19/2020   Type 2 diabetes mellitus with stage 3 chronic kidney disease, without long-term current use of insulin ([place]) 08/24/2018   Hyperlipidemia, unspecified 07/19/2017   Hypertension 07/19/2017   PUD (peptic ulcer disease) 07/19/2017   Type 2 diabetes mellitus ([place]) 07/19/2017   Personal history of gout 08/12/2016   Anemia, unspecified 04/09/2016   Hypothyroidism, acquired 12/10/2015    Ramah Langhans, PT, DPT 09/29/2021, 11:50 AM  [place] [address] [place], [place], [address] Phone: [phone]   Fax:  [phone]  Name: Cassandra Schaefer MRN: [id number] Date of Birth: 1929-02-08

## 2021-09-29 NOTE — Therapy (Signed)
[place] MAIN [facility] SERVICES [place], [place], [address] Phone: [phone]   Fax:  [phone]  Occupational Therapy Treatment  Patient Details  Name: Cassandra Schaefer MRN: [id number] Date of Birth: [birth year] Referring Provider (OT): Dr. Tracie Harrier   Encounter Date: 09/29/2021   OT End of Session - 09/29/21 1156     Visit Number 53    Number of Visits 31    Date for OT Re-Evaluation 10/06/21    Authorization Time Period Reporting period starting 06/18/2020    OT Start Time 1145    OT Stop Time 1230    OT Time Calculation (min) 45 min    Activity Tolerance Patient tolerated treatment well    Behavior During Therapy WFL for tasks assessed/performed             Past Medical History:  Diagnosis Date   Anemia    Cough    RESOLVING, NO FEVER   Diabetes mellitus without complication ([place])    Gout    Hypertension    Hypothyroidism    PUD (peptic ulcer disease)    Stroke [facility])    Wears dentures    full upper, partial lower    Past Surgical History:  Procedure Laterality Date   AMPUTATION TOE Left 12/14/2017   Procedure: AMPUTATION TOE-4TH MPJ;  Surgeon: Samara Deist, DPM;  Location: [place];  Service: Podiatry;  Laterality: Left;  IVA LOCAL Diabetic - oral meds   APPENDECTOMY     CATARACT EXTRACTION W/PHACO Right 06/23/2015   Procedure: CATARACT EXTRACTION PHACO AND INTRAOCULAR LENS PLACEMENT (IOC);  Surgeon: Estill Cotta, MD;  Location: [facility];  Service: Ophthalmology;  Laterality: Right;  [place] 01:28 AP% 23.4 CDE 36.14 fluid pack lot # [id number]   CATARACT EXTRACTION W/PHACO Left 07/28/2015   Procedure: CATARACT EXTRACTION PHACO AND INTRAOCULAR LENS PLACEMENT (IOC);  Surgeon: Estill Cotta, MD;  Location: [facility];  Service: Ophthalmology;  Laterality: Left;  [place]    1:29.7 AP%  24.9 CDE   41.32 fluid casette lot [id number] H exp 09/23/2016   COONOSCOPY     AND ENDOSCOPY   DILATION AND  CURETTAGE OF UTERUS     ENDARTERECTOMY Left 11/13/2020   Procedure: Evacuation of left neck hematoma;  Surgeon: Katha Cabal, MD;  Location: [facility];  Service: Vascular;  Laterality: Left;   ENDARTERECTOMY Left 11/13/2020   Procedure: ENDARTERECTOMY CAROTID;  Surgeon: Algernon Huxley, MD;  Location: [facility];  Service: Vascular;  Laterality: Left;   TONSILLECTOMY     TUBAL LIGATION      There were no vitals filed for this visit.   Subjective Assessment - 09/29/21 1155     Subjective  Pt. reports that she is feeling better today    Patient is accompanied by: Family member    Pertinent History R ankle pain, gout, T2DM, HTN, CKDIII; R/L carotid endarectomy    Patient Stated Goals "I want to improve my aim when I'm using my R arm."    Currently in Pain? No/denies            OT Treatment  Neuromuscular re-education:   Pt. worked on right hand [facility] skills grasping small 1/2" circular tipped pegs. Pt. worked on moving them through her right hand from her palm to the tip of her 2nd digit, and thumb in preparation for placing them into a small pegboard placed at a vertical angle.   Pt.'s right UE functioning is improving. Pt. reports that she  is able to turn pages of books easier, bring a a glass to her mouth more securely, fasten her bra, wash her hair, and brush her teeth. Pt. is engaging her right hand more now during [place] with friends. Pt. is able to grasp the 1/2" circular tipped pegs from a flat dish easier, however continues to present with difficulty moving the pegs through her palm all the way out to the very tip of her 2nd digit and thumb in preparation for placing them into the small pegboard. Pt. continues to work on improving right hand motor control, and [facility] skills in order to work towards improving, and maximizing independence with ADLs, and IADL tasks.                   OT Education - 09/29/21 1156     Education Details RUE functioning.    Person(s)  Educated Patient    Methods Explanation;Verbal cues;Demonstration;Tactile cues    Comprehension Verbalized understanding;Verbal cues required;Returned demonstration              OT Short Term Goals - 03/23/21 1657       OT SHORT TERM GOAL #1   Title Pt will perform HEP for RUE strength/coordination independently.    Baseline Eval: HEP not yet established in outpatient, but pt is working with putty from [name]; 12/04/2020; [facility] HEP initiated this day with mod vc after return demo; 01/05/2021: indep with currently program, but ongoing as pt progresses; 01/28/2021: vc to revisit and focus on grip and pinch strengthening with putty; 03/23/21: pt is indep with HEP    Time 6    Period Weeks    Status Achieved    Target Date 03/23/21               OT Long Term Goals - 09/03/21 1313       OT LONG TERM GOAL #1   Title Pt will improve hand writing to 100% legibility with R hand to be able to independently write a check.    Baseline Eval: signature is 75% legible with R hand, requiring extra time; 12/04/2020: no chan to complete.ge from eval; 01/05/2021: Printing is 100% legible, signature is 90% legible, but still requires extra time and effort.; 01/26/21: Signature is 90% legible and speed/fluidity have improved. 20th visit: 90% legible, 03/23/21: 90% legible, extra time 40th visit 90 % legibility with increased time, 50th visit: 90% legible with with increased time required 07/14/2021: 90% legibility. 07/30/2021: 90% legibility, pt. presses too hard through the pen, and pencil. 09/03/2021: 90% legibility    Time 12    Period Weeks    Status On-going    Target Date 10/06/21      OT LONG TERM GOAL #2   Title Pt will improve R hand coordination to enable good manipulation of iphone using R dominant hand      OT LONG TERM GOAL #3   Title Pt will improve GMC throughout RUE to enable pt to reach for and pick up ADL supplies with R dominant hand without dropping or knocking over objects.    Baseline  Eval: Pt reports RUE is clumsy and easily knocks over objects when trying to reach for ADL supplies.  Pt verbalizes that her "aim is off."; 12/04/2020: pt reports slight improvement since eval and has started using her R hand to eat, but still feels clumsy; 01/05/2021: Greatly improved; pt is using silverware to eat with R hand, can comb hair with RUE, still mildly ataxic;  01/26/21: pt reports difficulty picking up objects within a small space (ie; may knock the toothpaste holder down when reaching for toothbrush), but reaching for light/larger objects is improving 20th visit: pt. continues to be mildly ataxic, however is consistently improving with motor control, and [place] while reaching targets; 03/23/21: Pt reaches with accuracy towards targets if she takes her time (pt drops and knocks things over when moving at a faster/normal pace) 04/23/2021: Pt. continues to present with difficulty with depth perception, and impaired [place].40th visit: Pt. is improving, however tends to knock over lighter weight objects.50th visit: Pt. has improved with reaching for items without knocking them over, however continues to need work on accuracy.07/14/2021: Pt. is improving while motor control, and accuracy of reaching for objects. Pt. continues to work towards improving efficiency with reaching for objects. 07/30/2021: Pt. is able to able to reach for objects more accurately, however continues to present with limited motor control in the Right. 09/03/2021: Pt. Pt. is improving with reaching up without knocking items over. pt. is now able to stand at the sink and reach up without dropping items.    Time 12    Period Weeks    Status On-going    Target Date 10/06/21      OT LONG TERM GOAL #4   Title Pt will improve FOTO score to 68 or better to indicate measurable functional improvement.    Baseline Eval: FOTO 55; 01/05/2021: FOTO 61; 01/28/21: FOTO 61, 20th visit: 61; 03/23/21: FOTO 60 4oth visit: FOTO score: 61, 50th visit: FOTO 69  07/30/2021: FOTO 71  09/03/2021: FOTO 73    Time 12    Period Weeks    Status On-going    Target Date 10/06/21      OT LONG TERM GOAL #5   Time 12      OT LONG TERM GOAL #6   Title Pt. will improve right hand [facility] skills to be able to indepedendently manipulate and set-up her medication/pills in the pillbox.    Baseline 07/14/2021: Pt. has difficulty manipulating small pills, and often drops them. 07/30/2021: Pt. continues to have difficulty grasping pills/medication for set-up of pillbox. 09/03/2021: Pt. is able to grasp pills from the pillbox without dropping them.    Time 12    Period Weeks    Status New    Target Date 10/06/21                   Plan - 09/29/21 1157     Clinical Impression Statement Pt.'s right UE functioning is improving. Pt. reports that she is able to turn pages of books easier, bring a a glass to her mouth more securely, fasten her bra, wash her hair, and brush her teeth. Pt. is engaging her right hand more now during [place] with friends. Pt. is able to grasp the 1/2" circular tipped pegs from a flat dish easier, however continues to present with difficulty moving the pegs through her palm all the way out to the very tip of her 2nd digit and thumb in preparation for placing them into the small pegboard. Pt. continues to work on improving right hand motor control, and [facility] skills in order to work towards improving, and maximizing independence with ADLs, and IADL tasks.      OT Occupational Profile and History Detailed Assessment- Review of Records and additional review of physical, cognitive, psychosocial history related to current functional performance    Occupational performance deficits (Please refer to evaluation for details): ADL's;Leisure;IADL's  Body Structure / Function / Physical Skills ADL;Coordination;Endurance;GMC;UE functional use;Balance;Sensation;Body mechanics;IADL;Pain;Dexterity;FMC;Strength;Gait;Mobility    Rehab Potential Good     Clinical Decision Making Several treatment options, min-mod task modification necessary    Comorbidities Affecting Occupational Performance: May have comorbidities impacting occupational performance    Modification or Assistance to Complete Evaluation  Min-Moderate modification of tasks or assist with assess necessary to complete eval    OT Frequency 2x / week    OT Duration 12 weeks    OT Treatment/Interventions Self-care/ADL training;Therapeutic exercise;DME and/or AE instruction;Functional Mobility Training;Balance training;Neuromuscular education;Therapeutic activities;Patient/family education    Consulted and Agree with Plan of Care Patient             Patient will benefit from skilled therapeutic intervention in order to improve the following deficits and impairments:   Body Structure / Function / Physical Skills: ADL, Coordination, Endurance, GMC, UE functional use, Balance, Sensation, Body mechanics, IADL, Pain, Dexterity, FMC, Strength, Gait, Mobility       Visit Diagnosis: Muscle weakness (generalized)  Other lack of coordination    Problem List Patient Active Problem List   Diagnosis Date Noted   Carotid stenosis, symptomatic, with infarction ([place]) 11/13/2020   Gout flare 11/10/2020   UTI (urinary tract infection) 11/10/2020   Left carotid artery stenosis 11/10/2020   Chronic kidney disease 11/10/2020   Left basal ganglia embolic stroke ([place]) [id number]   PAC (premature atrial contraction)    Pre-procedural cardiovascular examination    Ischemic stroke ([place]) 10/20/2020   TIA (transient ischemic attack) 10/19/2020   Right hemiparesis ([place]) 10/19/2020   Type 2 diabetes mellitus with stage 3 chronic kidney disease, without long-term current use of insulin ([place]) 08/24/2018   Hyperlipidemia, unspecified 07/19/2017   Hypertension 07/19/2017   PUD (peptic ulcer disease) 07/19/2017   Type 2 diabetes mellitus ([place]) 07/19/2017   Personal history of gout 08/12/2016    Anemia, unspecified 04/09/2016   Hypothyroidism, acquired 12/10/2015   Harrel Carina, MS, OTR/L   Harrel Carina, OT 09/29/2021, 11:58 AM  [place] [address] [place], [place], [address] Phone: [phone]   Fax:  [phone]  Name: Cassandra Schaefer MRN: [id number] Date of Birth: [birth year]

## 2021-10-01 ENCOUNTER — Ambulatory Visit: Payer: Medicare HMO | Admitting: Occupational Therapy

## 2021-10-01 ENCOUNTER — Ambulatory Visit: Payer: Medicare HMO

## 2021-10-01 DIAGNOSIS — R278 Other lack of coordination: Secondary | ICD-10-CM

## 2021-10-01 DIAGNOSIS — M6281 Muscle weakness (generalized): Secondary | ICD-10-CM | POA: Diagnosis not present

## 2021-10-01 DIAGNOSIS — R262 Difficulty in walking, not elsewhere classified: Secondary | ICD-10-CM

## 2021-10-01 DIAGNOSIS — R2681 Unsteadiness on feet: Secondary | ICD-10-CM

## 2021-10-01 NOTE — Therapy (Signed)
[facility] MAIN [facility] SERVICES [address] [place], [place], [address] Phone: [phone]   Fax:  [phone]  Physical Therapy Treatment  Patient Details  Name: Cassandra Schaefer MRN: [id number] Date of Birth: [birth year] Referring Provider (PT): Dr. Dew/Dr. Ginette Pitman PCP   Encounter Date: 10/01/2021   PT End of Session - 10/01/21 1301     Visit Number 6    Number of Visits 60    Date for PT Re-Evaluation 10/20/21    Authorization Type [employer]    PT Start Time 1346    PT Stop Time 1429    PT Time Calculation (min) 43 min    Equipment Utilized During Treatment Gait belt    Activity Tolerance Patient tolerated treatment well    Behavior During Therapy WFL for tasks assessed/performed             Past Medical History:  Diagnosis Date   Anemia    Cough    RESOLVING, NO FEVER   Diabetes mellitus without complication ([place])    Gout    Hypertension    Hypothyroidism    PUD (peptic ulcer disease)    Stroke [facility])    Wears dentures    full upper, partial lower    Past Surgical History:  Procedure Laterality Date   AMPUTATION TOE Left 12/14/2017   Procedure: AMPUTATION TOE-4TH MPJ;  Surgeon: Samara Deist, DPM;  Location: [place];  Service: Podiatry;  Laterality: Left;  IVA LOCAL Diabetic - oral meds   APPENDECTOMY     CATARACT EXTRACTION W/PHACO Right 06/23/2015   Procedure: CATARACT EXTRACTION PHACO AND INTRAOCULAR LENS PLACEMENT (IOC);  Surgeon: Estill Cotta, MD;  Location: [facility];  Service: Ophthalmology;  Laterality: Right;  [place] 01:28 AP% 23.4 CDE 36.14 fluid pack lot # [id number]   CATARACT EXTRACTION W/PHACO Left 07/28/2015   Procedure: CATARACT EXTRACTION PHACO AND INTRAOCULAR LENS PLACEMENT (IOC);  Surgeon: Estill Cotta, MD;  Location: [facility];  Service: Ophthalmology;  Laterality: Left;  [place]    1:29.7 AP%  24.9 CDE   41.32 fluid casette lot #[id number] H exp 09/23/2016   COONOSCOPY     AND ENDOSCOPY    DILATION AND CURETTAGE OF UTERUS     ENDARTERECTOMY Left 11/13/2020   Procedure: Evacuation of left neck hematoma;  Surgeon: Katha Cabal, MD;  Location: [facility];  Service: Vascular;  Laterality: Left;   ENDARTERECTOMY Left 11/13/2020   Procedure: ENDARTERECTOMY CAROTID;  Surgeon: Algernon Huxley, MD;  Location: [facility];  Service: Vascular;  Laterality: Left;   TONSILLECTOMY     TUBAL LIGATION      There were no vitals filed for this visit.   Subjective Assessment - 10/01/21 1348     Subjective Patient is heading to [place] this weekend from her great great granddaugthters christening. Has been moving well, no falls or LOB since last session.    Patient is accompained by: --   Daughter, [name]   Pertinent History 88 y.o. female with medical history significant for type 2 diabetes mellitus, essential hypertension, acquired hypothyroidism, hyperlipidemia, stage III chronic kidney disease reports increased right sided weakness on 10/19/20. She was diagnosed with left basal ganglia ischemic stroke. She was discharged to inpatient rehab for 86 days and then discharged home. Patient was evaluated by outpatient PT on 11/12/20. CVA was due to carotid stenosis. She underwent left carotid endartectomy on 7/21. Procedure went well however patient suffered from left cervical hematoma and had subsequent surgical procedure to stop  the bleed and remove the hematoma on 11/13/20. They were concerned with her breathing and therefore intubated her for 1 day, she was in ICU for 2 days and transferred to med/surge on 11/15/20. Patient was discharged home on 11/17/20 and is now returning to outpatient PT. She has had the stiches removed and is healing well. Denies any neck pain or stiffness. She lives with her daughter and has 24/7 caregiver support. She is still using RW for most ambulation. She is able to transfer to bedside commode well and is able to stand short time unsupported. She reports feeling very weak and  fatigued. She reports she has been dragging her right foot more. She denies any numbness/tingling; She reports feeling like her legs are wanting to do more but is hesitant because of fear of falling. She is mod I for self care morning routine. She does still receive help with showering. She has been working on increasing her activity but denies any formal exercise.    Limitations Standing;Walking    How long can you sit comfortably? NA    How long can you stand comfortably? 30-60 min with support    How long can you walk comfortably? about 100 feet with RW, still limited to short distances;    Diagnostic tests MRI shows left basal ganglia infarct 10/21/20    Patient Stated Goals "Improve balance and walking and not need RW, get back to independent."    Currently in Pain? No/denies                    TREATMENT: Warm up on Nustep, BUE/BLE level 3-5 (interval training), x3 min with cues for steps per minute >65 to challenge cardiovascular conditioning; Patient reports moderate difficulty especially increased resistance;     Standing with CGA next to support surface:  Airex pad: static stand 30 seconds x 2 trials, noticeable trembling of ankles/LE's with fatigue and challenge to maintain stability Airex pad: horizontal head turns 30 seconds scanning room 10x ; cueing for arc of motion  Airex pad: vertical head turns 30 seconds, cueing for arc of motion, noticeable sway with upward gaze increasing demand on ankle righting reaction musculature Airex pad: one foot on 6" step one foot on airex pad, hold position for 30 seconds, switch legs, 2x each LE;  Airex pad reach for ball and throw into targetr x 4 minutes  ambulate across stable and unstable surface outside. Negotiating changing surfaces across sidewalk (inclines and declines), across brick with turns and obstacles in pathway without LOB. With cane and close CGA   Pt educated throughout session about proper posture and technique  with exercises. Improved exercise technique, movement at target joints, use of target muscles after min to mod verbal, visual, tactile cues. Rationale for Evaluation and Treatment Rehabilitation    Patient presents with excellent motivation throughout physical therapy session. Ambulation across changing surfaces tolerated well with close CGA and cues for lifting RLE due to occasional dragging of limb.  Dual task of throwing object while on unstable surface is very challenging for patient. She would benefit from additional skilled PT intervention to improve strength, balance and mobility             PT Education - 10/01/21 1301     Education Details exercise technique, body mechanics    Person(s) Educated Patient    Methods Explanation;Demonstration;Tactile cues;Verbal cues    Comprehension Verbalized understanding;Returned demonstration;Verbal cues required;Tactile cues required  PT Short Term Goals - 09/10/21 1354       PT SHORT TERM GOAL #1   Title Patient will be adherent to HEP at least 3x a week to improve functional strength and balance for better safety at home.    Baseline 9/21: Pt reports compliance with HEP and is confident, 1/3: doing exercise 2x a week; 06/25/21: 3x/week, 5/18: consistent;    Time 4    Period Weeks    Status Achieved    Target Date 06/25/21      PT SHORT TERM GOAL #2   Title Patient will be independent in transferring sit<>Stand without pushing on arm rests to improve ability to get up from chair.    Baseline 9/21: pt able to complete STS without use of UEs, 1/3:able to stand but has moderate difficulty requiring increased time; 06/25/21: able to perform indep without hands but requiring use of momentum to attain standing. Remains stable. 5/18: able to stand without UE assist, close supervision, remains stable;    Time 4    Period Weeks    Status Achieved    Target Date 07/23/21               PT Long Term Goals - 09/10/21  1357       PT LONG TERM GOAL #1   Title Patient (> 76 years old) will complete five times sit to stand test in < 15 seconds indicating an increased LE strength and improved balance.    Baseline 9/21: 29 seconds hands-free 10/10: deferred 10/12: 34 sec hands free; 11/2: 23.8 sec hands-free; 12/7: 33.2 sec hands free, 1/3: 33.92 hands free; 06/25/21: 25.69 sec, 4/13: 15.5 sec hands free, 5/18: 23 sec without UE    Time 8    Period Weeks    Status Partially Met    Target Date 10/20/21      PT LONG TERM GOAL #2   Title Patient will increase six minute walk test distance to >400 feet for improve gait ability    Baseline 9/21: 368 ft with RW; 10/10: deferred 10/12: 408 ft; 11/2: 476 ft with 4WW, 1/3: 475 feet with RW; 4/13: 450 feet without AD CGA    Time 8    Period Weeks    Status Achieved    Target Date 10/20/21      PT LONG TERM GOAL #3   Title Patient will increase 10 meter walk test to >1.75ms as to improve gait speed for better community ambulation and to reduce fall risk.    Baseline 9/21: 0.5 m/s with RW; 10/10 deferred 10/12: 0.93 m/s with 44MG 11/2: 0.83 m/s with 48QP 12/7: 0.52 m/s with RW, 1/3: 0.704; 06/25/21: 0.55 m/s with SPC. 5/18: 0.4 m/s without AD    Time 8    Period Weeks    Status On-going    Target Date 10/20/21      PT LONG TERM GOAL #4   Title Patient will be independent with ascend/descend 6 steps using single UE in step over step pattern without LOB.    Baseline 9/21:  Patient uses PT stairs (4 steps) with bilateral upper extremity support on handrails for both ascending/descending.  Patient exhibits reciprocal pattern ascending and step to pattern descending. 10/10: deferred; 10/12: ascending/descending recip. steps with BUE support; 11/2: Ascended/descended 8 steps total, reciprocal pattern ascending and step-to descending. Pt uses UUE support throughout with CGA assist.; 12/7: Ascended and descended 8 steps total with UUE support and CGA. Reciprocal stepping ascending  and  step to descending., 1/3: Deferrred; 06/25/21: able to perform with SUE support and reciprocal pattern asc/desc, but does require close CGA with descending. 5/18: independent with 2 handrails, close supervision with 1 handrai;    Time 8    Period Weeks    Status Partially Met    Target Date 10/20/21      PT LONG TERM GOAL #5   Title Patient will demonstrate an improved Berg Balance Score of >45/56 as to demonstrate improved balance with ADLs such as sitting/standing and transfer balance and reduced fall risk.    Baseline 9/21: deferred d/t time; 9/29: 42/56; 10/10: deferred; 10/12: 44/56; 11/2: deferred; 11/7: 46/56, 1/3: deferred    Time 8    Period Weeks    Status Achieved    Target Date 10/20/21      PT LONG TERM GOAL #6   Title Patient will improve FOTO score to >60% to indicate improved functional mobility with ADLs.    Baseline 9/21: 59; 10/10: 57; 11/2: 60; 12/7: 58%, 1/3: 61%, 4/13: 56%    Time 8    Period Weeks    Status On-going    Target Date 10/20/21      PT LONG TERM GOAL #7   Title Pt will improve FGA by a minimum of 5 points to indicate clinically significant improvement in reduced risk of falls with walking tasks.    Baseline 06/25/21: 12, 4/13: deferred, 5/18: deferred due to increased ankle discomfort;    Time 8    Period Weeks    Status New    Target Date 10/20/21                   Plan - 10/01/21 1428     Clinical Impression Statement Patient presents with excellent motivation throughout physical therapy session. Ambulation across changing surfaces tolerated well with close CGA and cues for lifting RLE due to occasional dragging of limb.  Dual task of throwing object while on unstable surface is very challenging for patient. She would benefit from additional skilled PT intervention to improve strength, balance and mobility    Personal Factors and Comorbidities Age    Comorbidities HTN, CKD, fall risk, type 2 diabetes,    Examination-Activity  Limitations Lift;Locomotion Level;Squat;Stairs;Stand;Toileting;Transfers;Bathing    Examination-Participation Restrictions Cleaning;Community Activity;Laundry;Meal Prep;Shop;Volunteer;Driving    Stability/Clinical Decision Making Stable/Uncomplicated    Rehab Potential Fair    PT Frequency 2x / week    PT Duration 8 weeks    PT Treatment/Interventions Cryotherapy;Electrical Stimulation;Moist Heat;Gait training;Stair training;Functional mobility training;Therapeutic activities;Therapeutic exercise;Neuromuscular re-education;Balance training;Patient/family education;Orthotic Fit/Training;Energy conservation    PT Next Visit Plan work on LE strengthening and balance; gait training, endurance, continue POC as previously indicated    PT Home Exercise Plan Access Code: [email or web address]; no updates    Consulted and Agree with Plan of Care Patient             Patient will benefit from skilled therapeutic intervention in order to improve the following deficits and impairments:  Abnormal gait, Decreased balance, Decreased endurance, Decreased mobility, Difficulty walking, Cardiopulmonary status limiting activity, Decreased activity tolerance, Decreased coordination, Decreased strength  Visit Diagnosis: Muscle weakness (generalized)  Difficulty in walking, not elsewhere classified  Unsteadiness on feet     Problem List Patient Active Problem List   Diagnosis Date Noted   Carotid stenosis, symptomatic, with infarction ([place]) 11/13/2020   Gout flare 11/10/2020   UTI (urinary tract infection) 11/10/2020   Left carotid artery stenosis 11/10/2020   Chronic kidney disease  11/10/2020   Left basal ganglia embolic stroke ([place]) 04/88/8916   PAC (premature atrial contraction)    Pre-procedural cardiovascular examination    Ischemic stroke ([place]) 10/20/2020   TIA (transient ischemic attack) 10/19/2020   Right hemiparesis ([place]) 10/19/2020   Type 2 diabetes mellitus with stage 3 chronic kidney disease,  without long-term current use of insulin ([place]) 08/24/2018   Hyperlipidemia, unspecified 07/19/2017   Hypertension 07/19/2017   PUD (peptic ulcer disease) 07/19/2017   Type 2 diabetes mellitus ([place]) 07/19/2017   Personal history of gout 08/12/2016   Anemia, unspecified 04/09/2016   Hypothyroidism, acquired 12/10/2015    Janna Arch, PT, DPT  10/01/2021, 2:30 PM  [place] MAIN [facility] SERVICES [address] [place], [place], [address] Phone: [phone]   Fax:  [phone]  Name: Iley Deignan MRN: [id number] Date of Birth: [birth year]

## 2021-10-01 NOTE — Therapy (Signed)
[place] MAIN [facility] SERVICES [address] [place], [place], [address] Phone: [phone]   Fax:  [phone]  Occupational Therapy Treatment  Patient Details  Name: Cassandra Schaefer MRN: [id number] Date of Birth: 1928-11-06 Referring Provider (OT): Dr. Tracie Harrier   Encounter Date: 10/01/2021   OT End of Session - 10/01/21 1304     Visit Number 32    Number of Visits 50    Date for OT Re-Evaluation 10/06/21    Authorization Time Period Reporting period starting 06/18/2020    OT Start Time 1300    OT Stop Time 1345    OT Time Calculation (min) 45 min    Equipment Utilized During Treatment tranport chair    Activity Tolerance Patient tolerated treatment well    Behavior During Therapy WFL for tasks assessed/performed             Past Medical History:  Diagnosis Date   Anemia    Cough    RESOLVING, NO FEVER   Diabetes mellitus without complication ([place])    Gout    Hypertension    Hypothyroidism    PUD (peptic ulcer disease)    Stroke [facility])    Wears dentures    full upper, partial lower    Past Surgical History:  Procedure Laterality Date   AMPUTATION TOE Left 12/14/2017   Procedure: AMPUTATION TOE-4TH MPJ;  Surgeon: Samara Deist, DPM;  Location: [place];  Service: Podiatry;  Laterality: Left;  IVA LOCAL Diabetic - oral meds   APPENDECTOMY     CATARACT EXTRACTION W/PHACO Right 06/23/2015   Procedure: CATARACT EXTRACTION PHACO AND INTRAOCULAR LENS PLACEMENT (IOC);  Surgeon: Estill Cotta, MD;  Location: [facility];  Service: Ophthalmology;  Laterality: Right;  [place] 01:28 AP% 23.4 CDE 36.14 fluid pack lot # [id number]   CATARACT EXTRACTION W/PHACO Left 07/28/2015   Procedure: CATARACT EXTRACTION PHACO AND INTRAOCULAR LENS PLACEMENT (IOC);  Surgeon: Estill Cotta, MD;  Location: [facility];  Service: Ophthalmology;  Laterality: Left;  [place]    1:29.7 AP%  24.9 CDE   41.32 fluid casette lot [id number] H exp 09/23/2016    COONOSCOPY     AND ENDOSCOPY   DILATION AND CURETTAGE OF UTERUS     ENDARTERECTOMY Left 11/13/2020   Procedure: Evacuation of left neck hematoma;  Surgeon: Katha Cabal, MD;  Location: [facility];  Service: Vascular;  Laterality: Left;   ENDARTERECTOMY Left 11/13/2020   Procedure: ENDARTERECTOMY CAROTID;  Surgeon: Algernon Huxley, MD;  Location: [facility];  Service: Vascular;  Laterality: Left;   TONSILLECTOMY     TUBAL LIGATION      There were no vitals filed for this visit.   Subjective Assessment - 10/01/21 1304     Subjective  Pt. reports that she is feeling well today.    Patient is accompanied by: Family member    Pertinent History R ankle pain, gout, T2DM, HTN, CKDIII; R/L carotid endarectomy    Currently in Pain? No/denies            OT TREATMENT    Neuro muscular re-education:  Pt. worked on right hand function skills focused on modulating graded flexion/pressure between light, and heavy items as pt. Grasps light objects with too much force. Pt. worked on alternating grasping, and lifting between light, extra light (fringed cup), and different weighted heavy items placed at the tabletop. Pt. worked on right hand [facility] skills grasping resistive connector beads. Pt. worked on using bilateral 3 point gasp  patterns to connect each bead. Pt. worked on using a lateral pinch grasp to disconnect each bead from the line. Pt. focused mainly on using the smooth textured beads to challenge maintaining a firm grasp on the beads.   Pt. plans to go the [place] this weekend for her great granddaughter's baptism. Pt. continues to make steady progress, and continues to try to engage her right hand during more ADL, and IADL tasks. Pt. presented with difficulty modulating the right UE grasp on light items, including extra light objects. Pt. was able to hold, and stabilize most of the beads when connecting, and disconnecting them. Pt. tends to drop the beads when attempting to use too much force  when pressing 2 smooth beads together.                            OT Education - 10/01/21 1304     Education Details RUE functioning.    Person(s) Educated Patient    Methods Explanation;Verbal cues;Demonstration;Tactile cues    Comprehension Verbalized understanding;Verbal cues required;Returned demonstration              OT Short Term Goals - 03/23/21 1657       OT SHORT TERM GOAL #1   Title Pt will perform HEP for RUE strength/coordination independently.    Baseline Eval: HEP not yet established in outpatient, but pt is working with putty from [name]; 12/04/2020; [facility] HEP initiated this day with mod vc after return demo; 01/05/2021: indep with currently program, but ongoing as pt progresses; 01/28/2021: vc to revisit and focus on grip and pinch strengthening with putty; 03/23/21: pt is indep with HEP    Time 6    Period Weeks    Status Achieved    Target Date 03/23/21               OT Long Term Goals - 09/03/21 1313       OT LONG TERM GOAL #1   Title Pt will improve hand writing to 100% legibility with R hand to be able to independently write a check.    Baseline Eval: signature is 75% legible with R hand, requiring extra time; 12/04/2020: no chan to complete.ge from eval; 01/05/2021: Printing is 100% legible, signature is 90% legible, but still requires extra time and effort.; 01/26/21: Signature is 90% legible and speed/fluidity have improved. 20th visit: 90% legible, 03/23/21: 90% legible, extra time 40th visit 90 % legibility with increased time, 50th visit: 90% legible with with increased time required 07/14/2021: 90% legibility. 07/30/2021: 90% legibility, pt. presses too hard through the pen, and pencil. 09/03/2021: 90% legibility    Time 12    Period Weeks    Status On-going    Target Date 10/06/21      OT LONG TERM GOAL #2   Title Pt will improve R hand coordination to enable good manipulation of iphone using R dominant hand      OT LONG TERM GOAL  #3   Title Pt will improve GMC throughout RUE to enable pt to reach for and pick up ADL supplies with R dominant hand without dropping or knocking over objects.    Baseline Eval: Pt reports RUE is clumsy and easily knocks over objects when trying to reach for ADL supplies.  Pt verbalizes that her "aim is off."; 12/04/2020: pt reports slight improvement since eval and has started using her R hand to eat, but still feels clumsy; 01/05/2021: Greatly improved;  pt is using silverware to eat with R hand, can comb hair with RUE, still mildly ataxic; 01/26/21: pt reports difficulty picking up objects within a small space (ie; may knock the toothpaste holder down when reaching for toothbrush), but reaching for light/larger objects is improving 20th visit: pt. continues to be mildly ataxic, however is consistently improving with motor control, and [place] while reaching targets; 03/23/21: Pt reaches with accuracy towards targets if she takes her time (pt drops and knocks things over when moving at a faster/normal pace) 04/23/2021: Pt. continues to present with difficulty with depth perception, and impaired [place].40th visit: Pt. is improving, however tends to knock over lighter weight objects.50th visit: Pt. has improved with reaching for items without knocking them over, however continues to need work on accuracy.07/14/2021: Pt. is improving while motor control, and accuracy of reaching for objects. Pt. continues to work towards improving efficiency with reaching for objects. 07/30/2021: Pt. is able to able to reach for objects more accurately, however continues to present with limited motor control in the Right. 09/03/2021: Pt. Pt. is improving with reaching up without knocking items over. pt. is now able to stand at the sink and reach up without dropping items.    Time 12    Period Weeks    Status On-going    Target Date 10/06/21      OT LONG TERM GOAL #4   Title Pt will improve FOTO score to 68 or better to indicate  measurable functional improvement.    Baseline Eval: FOTO 55; 01/05/2021: FOTO 61; 01/28/21: FOTO 61, 20th visit: 61; 03/23/21: FOTO 60 4oth visit: FOTO score: 61, 50th visit: FOTO 69 07/30/2021: FOTO 71  09/03/2021: FOTO 73    Time 12    Period Weeks    Status On-going    Target Date 10/06/21      OT LONG TERM GOAL #5   Time 12      OT LONG TERM GOAL #6   Title Pt. will improve right hand [facility] skills to be able to indepedendently manipulate and set-up her medication/pills in the pillbox.    Baseline 07/14/2021: Pt. has difficulty manipulating small pills, and often drops them. 07/30/2021: Pt. continues to have difficulty grasping pills/medication for set-up of pillbox. 09/03/2021: Pt. is able to grasp pills from the pillbox without dropping them.    Time 12    Period Weeks    Status New    Target Date 10/06/21                   Plan - 10/01/21 1437     Clinical Impression Statement Pt. plans to go the [place] this weekend for her great granddaughter's baptism. Pt. continues to make steady progress, and continues to try to engage her right hand during more ADL, and IADL tasks. Pt. presented with difficulty modulating the right UE grasp on light items, including extra light objects. Pt. was able to hold, and stabilize most of the beads when connecting, and disconnecting them. Pt. tends to drop the beads when attempting to use too much force when pressing 2 smooth beads together.    OT Occupational Profile and History Detailed Assessment- Review of Records and additional review of physical, cognitive, psychosocial history related to current functional performance    Occupational performance deficits (Please refer to evaluation for details): ADL's;Leisure;IADL's    Body Structure / Function / Physical Skills ADL;Coordination;Endurance;GMC;UE functional use;Balance;Sensation;Body mechanics;IADL;Pain;Dexterity;FMC;Strength;Gait;Mobility    Rehab Potential Good    Clinical Decision Making  Several treatment options, min-mod task modification necessary    Comorbidities Affecting Occupational Performance: May have comorbidities impacting occupational performance    Modification or Assistance to Complete Evaluation  Min-Moderate modification of tasks or assist with assess necessary to complete eval    OT Frequency 2x / week    OT Duration 12 weeks    OT Treatment/Interventions Self-care/ADL training;Therapeutic exercise;DME and/or AE instruction;Functional Mobility Training;Balance training;Neuromuscular education;Therapeutic activities;Patient/family education    Family Member Consulted daughter, Olivia Mackie             Patient will benefit from skilled therapeutic intervention in order to improve the following deficits and impairments:   Body Structure / Function / Physical Skills: ADL, Coordination, Endurance, GMC, UE functional use, Balance, Sensation, Body mechanics, IADL, Pain, Dexterity, FMC, Strength, Gait, Mobility       Visit Diagnosis: Muscle weakness (generalized)  Other lack of coordination    Problem List Patient Active Problem List   Diagnosis Date Noted   Carotid stenosis, symptomatic, with infarction ([place]) 11/13/2020   Gout flare 11/10/2020   UTI (urinary tract infection) 11/10/2020   Left carotid artery stenosis 11/10/2020   Chronic kidney disease 11/10/2020   Left basal ganglia embolic stroke ([place]) [id number]   PAC (premature atrial contraction)    Pre-procedural cardiovascular examination    Ischemic stroke ([place]) 10/20/2020   TIA (transient ischemic attack) 10/19/2020   Right hemiparesis ([place]) 10/19/2020   Type 2 diabetes mellitus with stage 3 chronic kidney disease, without long-term current use of insulin ([place]) 08/24/2018   Hyperlipidemia, unspecified 07/19/2017   Hypertension 07/19/2017   PUD (peptic ulcer disease) 07/19/2017   Type 2 diabetes mellitus ([place]) 07/19/2017   Personal history of gout 08/12/2016   Anemia, unspecified 04/09/2016    Hypothyroidism, acquired 12/10/2015   Harrel Carina, MS, OTR/L  Harrel Carina, OT 10/01/2021, 2:38 PM  [place] MAIN [facility] SERVICES [address] [place], [place], [address] Phone: [phone]   Fax:  [phone]  Name: Cassandra Schaefer MRN: [id number] Date of Birth: January 08, 1929

## 2021-10-06 ENCOUNTER — Ambulatory Visit: Payer: Medicare HMO

## 2021-10-06 ENCOUNTER — Encounter: Payer: Self-pay | Admitting: Physical Therapy

## 2021-10-06 ENCOUNTER — Ambulatory Visit: Payer: Medicare HMO | Admitting: Physical Therapy

## 2021-10-06 DIAGNOSIS — R2681 Unsteadiness on feet: Secondary | ICD-10-CM

## 2021-10-06 DIAGNOSIS — R278 Other lack of coordination: Secondary | ICD-10-CM

## 2021-10-06 DIAGNOSIS — R482 Apraxia: Secondary | ICD-10-CM

## 2021-10-06 DIAGNOSIS — M6281 Muscle weakness (generalized): Secondary | ICD-10-CM

## 2021-10-06 DIAGNOSIS — R262 Difficulty in walking, not elsewhere classified: Secondary | ICD-10-CM

## 2021-10-06 DIAGNOSIS — R269 Unspecified abnormalities of gait and mobility: Secondary | ICD-10-CM

## 2021-10-06 DIAGNOSIS — R2689 Other abnormalities of gait and mobility: Secondary | ICD-10-CM

## 2021-10-06 NOTE — Therapy (Unsigned)
[facility] [place], [place], [address] Phone: [phone]   Fax:  [phone]  Physical Therapy Treatment  Patient Details  Name: Cassandra Schaefer MRN: [id number] Date of Birth: 1929-02-25 Referring Provider (PT): Dr. Dew/Dr. Ginette Pitman PCP   Encounter Date: 10/06/2021   PT End of Session - 10/06/21 1357     Visit Number 23    Number of Visits 39    Date for PT Re-Evaluation 10/20/21    Authorization Type [employer]    PT Start Time 1347    PT Stop Time 1428    PT Time Calculation (min) 41 min    Equipment Utilized During Treatment Gait belt    Activity Tolerance Patient tolerated treatment well    Behavior During Therapy WFL for tasks assessed/performed             Past Medical History:  Diagnosis Date   Anemia    Cough    RESOLVING, NO FEVER   Diabetes mellitus without complication ([place])    Gout    Hypertension    Hypothyroidism    PUD (peptic ulcer disease)    Stroke [facility])    Wears dentures    full upper, partial lower    Past Surgical History:  Procedure Laterality Date   AMPUTATION TOE Left 12/14/2017   Procedure: AMPUTATION TOE-4TH MPJ;  Surgeon: Samara Deist, DPM;  Location: [facility];  Service: Podiatry;  Laterality: Left;  IVA LOCAL Diabetic - oral meds   APPENDECTOMY     CATARACT EXTRACTION W/PHACO Right 06/23/2015   Procedure: CATARACT EXTRACTION PHACO AND INTRAOCULAR LENS PLACEMENT (IOC);  Surgeon: Estill Cotta, MD;  Location: [facility];  Service: Ophthalmology;  Laterality: Right;  [place] 01:28 AP% 23.4 CDE 36.14 fluid pack lot # [id number]   CATARACT EXTRACTION W/PHACO Left 07/28/2015   Procedure: CATARACT EXTRACTION PHACO AND INTRAOCULAR LENS PLACEMENT (IOC);  Surgeon: Estill Cotta, MD;  Location: [facility];  Service: Ophthalmology;  Laterality: Left;  [place]    1:29.7 AP%  24.9 CDE   41.32 fluid casette lot #[id number] H exp 09/23/2016   COONOSCOPY     AND ENDOSCOPY    DILATION AND CURETTAGE OF UTERUS     ENDARTERECTOMY Left 11/13/2020   Procedure: Evacuation of left neck hematoma;  Surgeon: Katha Cabal, MD;  Location: [facility];  Service: Vascular;  Laterality: Left;   ENDARTERECTOMY Left 11/13/2020   Procedure: ENDARTERECTOMY CAROTID;  Surgeon: Algernon Huxley, MD;  Location: [facility];  Service: Vascular;  Laterality: Left;   TONSILLECTOMY     TUBAL LIGATION      There were no vitals filed for this visit.   Subjective Assessment - 10/06/21 1355     Subjective Patient had a big weekend. Granddaughter graduated from high school and attended her great-granddaughter's [employer]; She reports increased fatigue from travelling. She reports feeling like she has recovered;    Patient is accompained by: --   Daughter, [name]   Pertinent History 88 y.o. female with medical history significant for type 2 diabetes mellitus, essential hypertension, acquired hypothyroidism, hyperlipidemia, stage III chronic kidney disease reports increased right sided weakness on 10/19/20. She was diagnosed with left basal ganglia ischemic stroke. She was discharged to inpatient rehab for 10 days and then discharged home. Patient was evaluated by outpatient PT on 11/12/20. CVA was due to carotid stenosis. She underwent left carotid endartectomy on 7/21. Procedure went well however patient suffered from left cervical hematoma and had  subsequent surgical procedure to stop the bleed and remove the hematoma on 11/13/20. They were concerned with her breathing and therefore intubated her for 1 day, she was in ICU for 2 days and transferred to med/surge on 11/15/20. Patient was discharged home on 11/17/20 and is now returning to outpatient PT. She has had the stiches removed and is healing well. Denies any neck pain or stiffness. She lives with her daughter and has 24/7 caregiver support. She is still using RW for most ambulation. She is able to transfer to bedside commode well and is able to stand  short time unsupported. She reports feeling very weak and fatigued. She reports she has been dragging her right foot more. She denies any numbness/tingling; She reports feeling like her legs are wanting to do more but is hesitant because of fear of falling. She is mod I for self care morning routine. She does still receive help with showering. She has been working on increasing her activity but denies any formal exercise.    Limitations Standing;Walking    How long can you sit comfortably? NA    How long can you stand comfortably? 30-60 min with support    How long can you walk comfortably? about 100 feet with RW, still limited to short distances;    Diagnostic tests MRI shows left basal ganglia infarct 10/21/20    Patient Stated Goals "Improve balance and walking and not need RW, get back to independent."    Currently in Pain? No/denies                    TREATMENT: Warm up on Nustep, BUE/BLE level 3-5 (interval training), x4 min with cues for steps per minute >65 to challenge cardiovascular conditioning; Patient reports moderate difficulty especially increased resistance;   Sit<>Stand from chair unsupported x5 reps  Seated hamstring curl x15 reps each LE, green tband;   Step up on 6 inch step with 1 rail assist only x5 reps each LE with min A for safety; Does exhibits increased fatigue requiring increased effort to clear foot.   NMR:  Instructed patient in obstacle course: Walk over red mat Step over orange hurdle Weave around #6 cones Step up and over purple airex pad X1 set with tripod base cane X1 set without AD    Standing with CGA next to support surface:  Airex pad:  Alternate toe taps to 2nd step (12 inch step) x10 reps, unsupported, each LE with min A for safety; Tandem stance:    Unsupported standing 30 sec hold x2 reps in front  Progressed to head turns side/side x5 reps  Progressed BUE arm raise x5 reps;  Pt educated throughout session about proper posture  and technique with exercises. Improved exercise technique, movement at target joints, use of target muscles after min to mod verbal, visual, tactile cues. Rationale for Evaluation and Treatment Rehabilitation                                PT Education - 10/06/21 1356     Education Details exercise technique/positioning;    Person(s) Educated Patient    Methods Explanation;Verbal cues    Comprehension Verbalized understanding;Returned demonstration;Verbal cues required;Need further instruction              PT Short Term Goals - 09/10/21 1354       PT SHORT TERM GOAL #1   Title Patient will be adherent to  HEP at least 3x a week to improve functional strength and balance for better safety at home.    Baseline 9/21: Pt reports compliance with HEP and is confident, 1/3: doing exercise 2x a week; 06/25/21: 3x/week, 5/18: consistent;    Time 4    Period Weeks    Status Achieved    Target Date 06/25/21      PT SHORT TERM GOAL #2   Title Patient will be independent in transferring sit<>Stand without pushing on arm rests to improve ability to get up from chair.    Baseline 9/21: pt able to complete STS without use of UEs, 1/3:able to stand but has moderate difficulty requiring increased time; 06/25/21: able to perform indep without hands but requiring use of momentum to attain standing. Remains stable. 5/18: able to stand without UE assist, close supervision, remains stable;    Time 4    Period Weeks    Status Achieved    Target Date 07/23/21               PT Long Term Goals - 09/10/21 1357       PT LONG TERM GOAL #1   Title Patient (> 74 years old) will complete five times sit to stand test in < 15 seconds indicating an increased LE strength and improved balance.    Baseline 9/21: 29 seconds hands-free 10/10: deferred 10/12: 34 sec hands free; 11/2: 23.8 sec hands-free; 12/7: 33.2 sec hands free, 1/3: 33.92 hands free; 06/25/21: 25.69 sec, 4/13: 15.5 sec hands  free, 5/18: 23 sec without UE    Time 8    Period Weeks    Status Partially Met    Target Date 10/20/21      PT LONG TERM GOAL #2   Title Patient will increase six minute walk test distance to >400 feet for improve gait ability    Baseline 9/21: 368 ft with RW; 10/10: deferred 10/12: 408 ft; 11/2: 476 ft with 4WW, 1/3: 475 feet with RW; 4/13: 450 feet without AD CGA    Time 8    Period Weeks    Status Achieved    Target Date 10/20/21      PT LONG TERM GOAL #3   Title Patient will increase 10 meter walk test to >1.20ms as to improve gait speed for better community ambulation and to reduce fall risk.    Baseline 9/21: 0.5 m/s with RW; 10/10 deferred 10/12: 0.93 m/s with 42BJ 11/2: 0.83 m/s with 46EG 12/7: 0.52 m/s with RW, 1/3: 0.704; 06/25/21: 0.55 m/s with SPC. 5/18: 0.4 m/s without AD    Time 8    Period Weeks    Status On-going    Target Date 10/20/21      PT LONG TERM GOAL #4   Title Patient will be independent with ascend/descend 6 steps using single UE in step over step pattern without LOB.    Baseline 9/21:  Patient uses PT stairs (4 steps) with bilateral upper extremity support on handrails for both ascending/descending.  Patient exhibits reciprocal pattern ascending and step to pattern descending. 10/10: deferred; 10/12: ascending/descending recip. steps with BUE support; 11/2: Ascended/descended 8 steps total, reciprocal pattern ascending and step-to descending. Pt uses UUE support throughout with CGA assist.; 12/7: Ascended and descended 8 steps total with UUE support and CGA. Reciprocal stepping ascending and step to descending., 1/3: Deferrred; 06/25/21: able to perform with SUE support and reciprocal pattern asc/desc, but does require close CGA with descending. 5/18: independent with 2  handrails, close supervision with 1 handrai;    Time 8    Period Weeks    Status Partially Met    Target Date 10/20/21      PT LONG TERM GOAL #5   Title Patient will demonstrate an improved Berg  Balance Score of >45/56 as to demonstrate improved balance with ADLs such as sitting/standing and transfer balance and reduced fall risk.    Baseline 9/21: deferred d/t time; 9/29: 42/56; 10/10: deferred; 10/12: 44/56; 11/2: deferred; 11/7: 46/56, 1/3: deferred    Time 8    Period Weeks    Status Achieved    Target Date 10/20/21      PT LONG TERM GOAL #6   Title Patient will improve FOTO score to >60% to indicate improved functional mobility with ADLs.    Baseline 9/21: 59; 10/10: 57; 11/2: 60; 12/7: 58%, 1/3: 61%, 4/13: 56%    Time 8    Period Weeks    Status On-going    Target Date 10/20/21      PT LONG TERM GOAL #7   Title Pt will improve FGA by a minimum of 5 points to indicate clinically significant improvement in reduced risk of falls with walking tasks.    Baseline 06/25/21: 12, 4/13: deferred, 5/18: deferred due to increased ankle discomfort;    Time 8    Period Weeks    Status New    Target Date 10/20/21                   Plan - 10/07/21 1003     Clinical Impression Statement Patient motivated and participated well within session. She was instructed in advanced LE strengthening/cardiovascular conditioning. Patient does require min VCS for correct exercise technique for optimal muscle strengthening. Instructed patient in dynamic balance with negotiating various obstacles with and without tripod base cane. Patient exhibits improved dynamic balance when weaving around cones. She does have difficulty stepping over higher objects such as orange hurdle. She did require CGA to min A for gait safety when walking on unstable surfaces. She would benefit from additional skilled PT Intervention to improve strength, balance and mobility;    Personal Factors and Comorbidities Age    Comorbidities HTN, CKD, fall risk, type 2 diabetes,    Examination-Activity Limitations Lift;Locomotion Level;Squat;Stairs;Stand;Toileting;Transfers;Bathing    Examination-Participation Restrictions  Cleaning;Community Activity;Laundry;Meal Prep;Shop;Volunteer;Driving    Stability/Clinical Decision Making Stable/Uncomplicated    Rehab Potential Fair    PT Frequency 2x / week    PT Duration 8 weeks    PT Treatment/Interventions Cryotherapy;Electrical Stimulation;Moist Heat;Gait training;Stair training;Functional mobility training;Therapeutic activities;Therapeutic exercise;Neuromuscular re-education;Balance training;Patient/family education;Orthotic Fit/Training;Energy conservation    PT Next Visit Plan work on LE strengthening and balance; gait training, endurance, continue POC as previously indicated    PT Home Exercise Plan Access Code: [email or web address]; no updates    Consulted and Agree with Plan of Care Patient             Patient will benefit from skilled therapeutic intervention in order to improve the following deficits and impairments:  Abnormal gait, Decreased balance, Decreased endurance, Decreased mobility, Difficulty walking, Cardiopulmonary status limiting activity, Decreased activity tolerance, Decreased coordination, Decreased strength  Visit Diagnosis: Muscle weakness (generalized)  Difficulty in walking, not elsewhere classified  Unsteadiness on feet  Other lack of coordination  Other abnormalities of gait and mobility  Apraxia  Abnormality of gait and mobility     Problem List Patient Active Problem List   Diagnosis Date Noted   Carotid stenosis,  symptomatic, with infarction ([place]) 11/13/2020   Gout flare 11/10/2020   UTI (urinary tract infection) 11/10/2020   Left carotid artery stenosis 11/10/2020   Chronic kidney disease 11/10/2020   Left basal ganglia embolic stroke ([place]) 70/96/2836   PAC (premature atrial contraction)    Pre-procedural cardiovascular examination    Ischemic stroke ([place]) 10/20/2020   TIA (transient ischemic attack) 10/19/2020   Right hemiparesis ([place]) 10/19/2020   Type 2 diabetes mellitus with stage 3 chronic kidney disease, without  long-term current use of insulin ([place]) 08/24/2018   Hyperlipidemia, unspecified 07/19/2017   Hypertension 07/19/2017   PUD (peptic ulcer disease) 07/19/2017   Type 2 diabetes mellitus ([place]) 07/19/2017   Personal history of gout 08/12/2016   Anemia, unspecified 04/09/2016   Hypothyroidism, acquired 12/10/2015    Cassandra Schaefer, PT, DPT 10/07/2021, 10:09 AM  [place], [place], [address] Phone: [phone]   Fax:  [phone]  Name: Cassandra Schaefer MRN: [id number] Date of Birth: 1929-03-03

## 2021-10-07 NOTE — Therapy (Signed)
[place] MAIN [facility] SERVICES [place], [place], [address] Phone: [phone]   Fax:  [phone]  Occupational Therapy Recertification  Patient Details  Name: Cassandra Schaefer MRN: [id number] Date of Birth: Apr 17, 1929 Referring Provider (OT): Dr. Tracie Harrier   Encounter Date: 10/06/2021   OT End of Session - 10/07/21 1257     Visit Number 66    Number of Visits 61    Date for OT Re-Evaluation 12/28/21    Authorization Time Period Reporting period starting 09/03/2021    OT Start Time 1300    OT Stop Time 1345    OT Time Calculation (min) 45 min    Equipment Utilized During Treatment tranport chair    Activity Tolerance Patient tolerated treatment well    Behavior During Therapy WFL for tasks assessed/performed             Past Medical History:  Diagnosis Date   Anemia    Cough    RESOLVING, NO FEVER   Diabetes mellitus without complication ([place])    Gout    Hypertension    Hypothyroidism    PUD (peptic ulcer disease)    Stroke [facility])    Wears dentures    full upper, partial lower    Past Surgical History:  Procedure Laterality Date   AMPUTATION TOE Left 12/14/2017   Procedure: AMPUTATION TOE-4TH MPJ;  Surgeon: Samara Deist, DPM;  Location: [place];  Service: Podiatry;  Laterality: Left;  IVA LOCAL Diabetic - oral meds   APPENDECTOMY     CATARACT EXTRACTION W/PHACO Right 06/23/2015   Procedure: CATARACT EXTRACTION PHACO AND INTRAOCULAR LENS PLACEMENT (IOC);  Surgeon: Estill Cotta, MD;  Location: [facility];  Service: Ophthalmology;  Laterality: Right;  [place] 01:28 AP% 23.4 CDE 36.14 fluid pack lot # [id number]   CATARACT EXTRACTION W/PHACO Left 07/28/2015   Procedure: CATARACT EXTRACTION PHACO AND INTRAOCULAR LENS PLACEMENT (IOC);  Surgeon: Estill Cotta, MD;  Location: [facility];  Service: Ophthalmology;  Laterality: Left;  [place]    1:29.7 AP%  24.9 CDE   41.32 fluid casette lot #[id number] H exp  09/23/2016   COONOSCOPY     AND ENDOSCOPY   DILATION AND CURETTAGE OF UTERUS     ENDARTERECTOMY Left 11/13/2020   Procedure: Evacuation of left neck hematoma;  Surgeon: Katha Cabal, MD;  Location: [facility];  Service: Vascular;  Laterality: Left;   ENDARTERECTOMY Left 11/13/2020   Procedure: ENDARTERECTOMY CAROTID;  Surgeon: Algernon Huxley, MD;  Location: [facility];  Service: Vascular;  Laterality: Left;   TONSILLECTOMY     TUBAL LIGATION      There were no vitals filed for this visit.   Subjective Assessment - 10/06/21 1256     Subjective  Pt. reports that she is doing well today.    Patient is accompanied by: Family member    Pertinent History R ankle pain, gout, T2DM, HTN, CKDIII; R/L carotid endarectomy    Patient Stated Goals "I want to improve my aim when I'm using my R arm."    Currently in Pain? No/denies    Pain Score 0-No pain    Pain Onset In the past 7 days                [facility] OT Assessment - 10/07/21 0001       Written Expression   Dominant Hand Right    Handwriting 90% legible    Written Experience Within Functional Limits   Increased time,  but pt states she signs cards and writes short notes on a card sufficiently.  Pt states she has no need to write checks anymore as her family assists with this and she has no desire to return to this.     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  See note for details.  Neuro re-ed: Worked with Jamar pegs, with cues for picking up pegs with a 3 point pinch pattern, as pt states she has greatly improved use of thumb and IF, but wants to engage other digits with better efficiency for object manipulation.  Practiced storage of multiple pegs in hands (frequent dropping) requiring cues for increased flexion of digits 3-5 to minimize dropping, and discarding pegs from palm.  Response to Treatment: Pt seen today for OT recertification evaluation.  Pt notes that her hand strength is less important to her than her dexterity, and hand strength is showing a plateau, limited d/t neuropathy in bilat hands.  Pt does continue to show progression, however, with R hand FMC/dexterity skills, see note for details.  Pt states she has greatly improved use of thumb and IF, but wants to engage other digits with better efficiency for object manipulation.  Pt is also struggling with graded force through RUE during functional activities as she reports she holds her greatgrandchild too tightly, scratches her arm too forcefully, and sometimes writes with too much pressure without realizing it.  Pt will continue to benefit from skilled OT for further development of R hand FMC/dexterity and grading force through RUE in order to maximize indep, accuracy, and efficiency with use of RUE during daily tasks.     OT Education - 10/06/21 1257     Education Details RUE functioning, progress towards OT goals/goal updates    Person(s) Educated Patient    Methods Explanation;Verbal cues;Demonstration;Tactile cues    Comprehension Verbalized understanding;Verbal cues required;Returned demonstration              OT Short Term Goals - 03/23/21 1657       OT SHORT TERM GOAL #1   Title Pt will perform HEP for RUE strength/coordination independently.    Baseline Eval: HEP not yet established  in outpatient, but pt is working with putty from [name]; 12/04/2020; [facility] HEP initiated this day with mod vc after return demo; 01/05/2021: indep with currently program, but ongoing as pt progresses; 01/28/2021: vc to revisit and focus on grip and pinch strengthening with putty; 03/23/21: pt is indep with HEP    Time 6    Period Weeks    Status Achieved  Target Date 03/23/21               OT Long Term Goals - 10/06/21 1320       OT LONG TERM GOAL #1   Title Pt will improve hand writing to 100% legibility with R hand to be able to independently write a check.    Baseline Eval: signature is 75% legible with R hand, requiring extra time; 12/04/2020: no chan to complete.ge from eval; 01/05/2021: Printing is 100% legible, signature is 90% legible, but still requires extra time and effort.; 01/26/21: Signature is 90% legible and speed/fluidity have improved. 20th visit: 90% legible, 03/23/21: 90% legible, extra time 40th visit 90 % legibility with increased time, 50th visit: 90% legible with with increased time required 07/14/2021: 90% legibility. 07/30/2021: 90% legibility, pt. presses too hard through the pen, and pencil. 09/03/2021: 90% legibility; 10/06/21: Increased time, but pt states she signs cards and writes short notes on a card sufficiently.  Pt states she has no need to write checks anymore as her family assists with this and she has no desire to return to this, but she would like to work on her pressure modulation.  New goal below.    Time 12    Period Weeks    Status Partially Met    Target Date 12/28/21      OT LONG TERM GOAL #2   Title Pt will improve R hand coordination to enable good manipulation of iphone using R dominant hand    Baseline Eval: Pt uses L hand d/t R hand lacking sufficient [place] to press buttons on phone; 12/04/2020: no change from eval; 01/05/2021: R hand coordination is improving but pt still uses L hand to dial phone; 01/28/21: Pt using R dominant hand to press buttons and  apps on phone 50% of the time.20th visit: Right hand [facility] skilols are improving, Pt. is improving with manipulation of the iphone; 03/23/21: Pt now consistently using R hand to press buttons on phone with good accuracy.40th visit: Pt. continues to have difficulty modifying the accurate amount of pressure needed to wipe the phone. 50th visit: Pt. is improving, however has difficulty modulating the amount of pressure needed for swiping the phone, as pt. swipes too hard. 07/16/2021: Pt. is now able to manipulate buttons on the Iphone to make calls.    Time 12    Period Weeks    Status Achieved    Target Date 07/14/21      OT LONG TERM GOAL #3   Title Pt will improve GMC throughout RUE to enable pt to reach for and pick up ADL supplies with R dominant hand without dropping or knocking over objects.    Baseline Eval: Pt reports RUE is clumsy and easily knocks over objects when trying to reach for ADL supplies.  Pt verbalizes that her "aim is off."; 12/04/2020: pt reports slight improvement since eval and has started using her R hand to eat, but still feels clumsy; 01/05/2021: Greatly improved; pt is using silverware to eat with R hand, can comb hair with RUE, still mildly ataxic; 01/26/21: pt reports difficulty picking up objects within a small space (ie; may knock the toothpaste holder down when reaching for toothbrush), but reaching for light/larger objects is improving 20th visit: pt. continues to be mildly ataxic, however is consistently improving with motor control, and [place] while reaching targets; 03/23/21: Pt reaches with accuracy towards targets if she takes her time (pt drops and knocks things over when moving at a  faster/normal pace) 04/23/2021: Pt. continues to present with difficulty with depth perception, and impaired [place].40th visit: Pt. is improving, however tends to knock over lighter weight objects.50th visit: Pt. has improved with reaching for items without knocking them over, however continues to need  work on accuracy.07/14/2021: Pt. is improving while motor control, and accuracy of reaching for objects. Pt. continues to work towards improving efficiency with reaching for objects. 07/30/2021: Pt. is able to able to reach for objects more accurately, however continues to present with limited motor control in the Right. 09/03/2021: Pt. Pt. is improving with reaching up without knocking items over. pt. is now able to stand at the sink and reach up without dropping items; 10/06/21: pt estimates she reaches with 85-90% accurate with RUE, occasional dropping    Time 12    Period Weeks    Status On-going    Target Date 12/28/21      OT LONG TERM GOAL #4   Title Pt will improve FOTO score to 68 or better to indicate measurable functional improvement.    Baseline Eval: FOTO 55; 01/05/2021: FOTO 61; 01/28/21: FOTO 61, 20th visit: 61; 03/23/21: FOTO 60 4oth visit: FOTO score: 61, 50th visit: FOTO 69 07/30/2021: FOTO 71  09/03/2021: FOTO 73; 10/06/21: 73    Time 12    Period Weeks    Status On-going    Target Date 12/28/21      OT LONG TERM GOAL #5   Title Pt will perform dynamic standing tasks with modified indep, AD as needed in order to reduce fall risk with ADLs    Baseline Eval: CGA-min A for all dynamic standing tasks using RW; 12/04/2020: pt is using RW in home with distant supv, but family still provides close supv on stairs; 01/05/2021: Pt now using RW to amb in and around the house, can perform ADLs with RW and modified indep, and can manage stairs at home with modified indep.  No falls reported.    Time 12    Period Weeks    Status Achieved    Target Date 02/01/21      Long Term Additional Goals   Additional Long Term Goals Yes      OT LONG TERM GOAL #6   Title Pt. will improve right hand [facility] skills to be able to indepedendently manipulate and set-up her medication/pills in the pillbox.    Baseline 07/14/2021: Pt. has difficulty manipulating small pills, and often drops them. 07/30/2021: Pt.  continues to have difficulty grasping pills/medication for set-up of pillbox. 09/03/2021: Pt. is able to grasp pills from the pillbox without dropping them; 10/06/21: modified indep (with extra time to complete).    Time 12    Period Weeks    Status Partially Met    Target Date 12/28/21      OT LONG TERM GOAL #7   Title Pt will improve accuracy with graded force through RUE during functional tasks per self report.    Baseline 10/06/21: Pt reports she holds her greatgrandchild too tightly, scratches her arm too forcefully, and sometimes writes with too much pressure; pt struggles to grade force for functional activities.    Time 12    Period Weeks    Status New    Target Date 12/28/21              Plan - 10/06/21 1325     Clinical Impression Statement Pt seen today for OT recertification evaluation.  Pt notes that her hand strength is less important  to her than her dexterity, and hand strength is showing a plateau, limited d/t neuropathy in bilat hands.  Pt does continue to show progression, however, with R hand FMC/dexterity skills.  Pt states she has greatly improved use of thumb and IF, but wants to engage other digits with better efficiency for object manipulation.  Pt is also struggling with graded force through RUE during functional activities as she reports she holds her greatgrandchild too tightly, scratches her arm too forcefully, and sometimes writes with too much pressure without realizing it.?  Pt will continue to benefit from skilled OT for further development of R hand FMC/dexterity and grading force through RUE in order to maximize indep, accuracy, and efficiency with use of RUE during daily tasks.    OT Occupational Profile and History Detailed Assessment- Review of Records and additional review of physical, cognitive, psychosocial history related to current functional performance    Occupational performance deficits (Please refer to evaluation for details): ADL's;Leisure;IADL's     Body Structure / Function / Physical Skills ADL;Coordination;Endurance;GMC;UE functional use;Balance;Sensation;Body mechanics;IADL;Pain;Dexterity;FMC;Strength;Gait;Mobility    Rehab Potential Good    Clinical Decision Making Several treatment options, min-mod task modification necessary    Comorbidities Affecting Occupational Performance: May have comorbidities impacting occupational performance    Modification or Assistance to Complete Evaluation  Min-Moderate modification of tasks or assist with assess necessary to complete eval    OT Frequency 2x / week    OT Duration 12 weeks    OT Treatment/Interventions Self-care/ADL training;Therapeutic exercise;DME and/or AE instruction;Functional Mobility Training;Balance training;Neuromuscular education;Therapeutic activities;Patient/family education    Family Member Consulted daughter, Olivia Mackie             Patient will benefit from skilled therapeutic intervention in order to improve the following deficits and impairments:   Body Structure / Function / Physical Skills: ADL, Coordination, Endurance, GMC, UE functional use, Balance, Sensation, Body mechanics, IADL, Pain, Dexterity, FMC, Strength, Gait, Mobility       Visit Diagnosis: Muscle weakness (generalized)  Other lack of coordination    Problem List Patient Active Problem List   Diagnosis Date Noted   Carotid stenosis, symptomatic, with infarction ([place]) 11/13/2020   Gout flare 11/10/2020   UTI (urinary tract infection) 11/10/2020   Left carotid artery stenosis 11/10/2020   Chronic kidney disease 11/10/2020   Left basal ganglia embolic stroke ([place]) 56/31/4970   PAC (premature atrial contraction)    Pre-procedural cardiovascular examination    Ischemic stroke ([place]) 10/20/2020   TIA (transient ischemic attack) 10/19/2020   Right hemiparesis ([place]) 10/19/2020   Type 2 diabetes mellitus with stage 3 chronic kidney disease, without long-term current use of insulin ([place]) 08/24/2018    Hyperlipidemia, unspecified 07/19/2017   Hypertension 07/19/2017   PUD (peptic ulcer disease) 07/19/2017   Type 2 diabetes mellitus ([place]) 07/19/2017   Personal history of gout 08/12/2016   Anemia, unspecified 04/09/2016   Hypothyroidism, acquired 12/10/2015   Leta Speller, MS, OTR/L  Darleene Cleaver, OT 10/07/2021, 1:26 PM  [place] [address] [place], [place], [address] Phone: [phone]   Fax:  [phone]  Name: Sharell Hilmer MRN: [id number] Date of Birth: 1929-04-16

## 2021-10-08 ENCOUNTER — Encounter: Payer: Self-pay | Admitting: Physical Therapy

## 2021-10-08 ENCOUNTER — Ambulatory Visit: Payer: Medicare HMO | Admitting: Occupational Therapy

## 2021-10-08 ENCOUNTER — Ambulatory Visit: Payer: Medicare HMO | Admitting: Physical Therapy

## 2021-10-08 DIAGNOSIS — R482 Apraxia: Secondary | ICD-10-CM

## 2021-10-08 DIAGNOSIS — R278 Other lack of coordination: Secondary | ICD-10-CM

## 2021-10-08 DIAGNOSIS — I639 Cerebral infarction, unspecified: Secondary | ICD-10-CM

## 2021-10-08 DIAGNOSIS — R269 Unspecified abnormalities of gait and mobility: Secondary | ICD-10-CM

## 2021-10-08 DIAGNOSIS — M6281 Muscle weakness (generalized): Secondary | ICD-10-CM

## 2021-10-08 DIAGNOSIS — R262 Difficulty in walking, not elsewhere classified: Secondary | ICD-10-CM

## 2021-10-08 DIAGNOSIS — R2689 Other abnormalities of gait and mobility: Secondary | ICD-10-CM

## 2021-10-08 DIAGNOSIS — R2681 Unsteadiness on feet: Secondary | ICD-10-CM

## 2021-10-08 NOTE — Therapy (Signed)
[place] MAIN [facility] SERVICES [place], [place], [address] Phone: [phone]   Fax:  [phone]  Physical Therapy Treatment  Patient Details  Name: Cassandra Schaefer MRN: [id number] Date of Birth: Sep 13, 1928 Referring Provider (PT): Dr. Dew/Dr. Ginette Pitman PCP   Encounter Date: 10/08/2021   PT End of Session - 10/08/21 1417     Visit Number [id number]    Number of Visits 4    Date for PT Re-Evaluation 10/20/21    Authorization Type [employer]    PT Start Time 1348    PT Stop Time 1430    PT Time Calculation (min) 42 min    Equipment Utilized During Treatment Gait belt    Activity Tolerance Patient tolerated treatment well    Behavior During Therapy WFL for tasks assessed/performed             Past Medical History:  Diagnosis Date   Anemia    Cough    RESOLVING, NO FEVER   Diabetes mellitus without complication ([place])    Gout    Hypertension    Hypothyroidism    PUD (peptic ulcer disease)    Stroke [facility])    Wears dentures    full upper, partial lower    Past Surgical History:  Procedure Laterality Date   AMPUTATION TOE Left 12/14/2017   Procedure: AMPUTATION TOE-4TH MPJ;  Surgeon: Samara Deist, DPM;  Location: [place];  Service: Podiatry;  Laterality: Left;  IVA LOCAL Diabetic - oral meds   APPENDECTOMY     CATARACT EXTRACTION W/PHACO Right 06/23/2015   Procedure: CATARACT EXTRACTION PHACO AND INTRAOCULAR LENS PLACEMENT (IOC);  Surgeon: Estill Cotta, MD;  Location: [facility];  Service: Ophthalmology;  Laterality: Right;  [place] 01:28 AP% 23.4 CDE 36.14 fluid pack lot # [id number]   CATARACT EXTRACTION W/PHACO Left 07/28/2015   Procedure: CATARACT EXTRACTION PHACO AND INTRAOCULAR LENS PLACEMENT (IOC);  Surgeon: Estill Cotta, MD;  Location: [facility];  Service: Ophthalmology;  Laterality: Left;  [place]    1:29.7 AP%  24.9 CDE   41.32 fluid casette lot #[id number] H exp 09/23/2016   COONOSCOPY     AND ENDOSCOPY    DILATION AND CURETTAGE OF UTERUS     ENDARTERECTOMY Left 11/13/2020   Procedure: Evacuation of left neck hematoma;  Surgeon: Katha Cabal, MD;  Location: [facility];  Service: Vascular;  Laterality: Left;   ENDARTERECTOMY Left 11/13/2020   Procedure: ENDARTERECTOMY CAROTID;  Surgeon: Algernon Huxley, MD;  Location: [facility];  Service: Vascular;  Laterality: Left;   TONSILLECTOMY     TUBAL LIGATION      There were no vitals filed for this visit.   Subjective Assessment - 10/08/21 1417     Subjective Patient reports doing well. No pain, no new falls. Reports she has been working on doing more at home to build her endurance;    Patient is accompained by: --   Daughter, [name]   Pertinent History 88 y.o. female with medical history significant for type 2 diabetes mellitus, essential hypertension, acquired hypothyroidism, hyperlipidemia, stage III chronic kidney disease reports increased right sided weakness on 10/19/20. She was diagnosed with left basal ganglia ischemic stroke. She was discharged to inpatient rehab for 10 days and then discharged home. Patient was evaluated by outpatient PT on 11/12/20. CVA was due to carotid stenosis. She underwent left carotid endartectomy on 7/21. Procedure went well however patient suffered from left cervical hematoma and had subsequent surgical procedure to stop the  bleed and remove the hematoma on 11/13/20. They were concerned with her breathing and therefore intubated her for 1 day, she was in ICU for 2 days and transferred to med/surge on 11/15/20. Patient was discharged home on 11/17/20 and is now returning to outpatient PT. She has had the stiches removed and is healing well. Denies any neck pain or stiffness. She lives with her daughter and has 24/7 caregiver support. She is still using RW for most ambulation. She is able to transfer to bedside commode well and is able to stand short time unsupported. She reports feeling very weak and fatigued. She reports she has  been dragging her right foot more. She denies any numbness/tingling; She reports feeling like her legs are wanting to do more but is hesitant because of fear of falling. She is mod I for self care morning routine. She does still receive help with showering. She has been working on increasing her activity but denies any formal exercise.    Limitations Standing;Walking    How long can you sit comfortably? NA    How long can you stand comfortably? 30-60 min with support    How long can you walk comfortably? about 100 feet with RW, still limited to short distances;    Diagnostic tests MRI shows left basal ganglia infarct 10/21/20    Patient Stated Goals "Improve balance and walking and not need RW, get back to independent."    Currently in Pain? No/denies                 TREATMENT: Pt ambulated through hospital on level surface through cafeteria outside to healing garden with tripod base cane, CGA to min A with occasional mis-step; Pt required cues to keep cane in front of her for better cane use as patient initially ambulates with dragging cane beside her;   Outside: Gait on grass Forward x50 feet Side stepping x20 feet x1 lap each direction Utilized tripod base cane requiring CGA to min A for safety;  Required cues to increase step length and improve erect posture for better gait safety.   Walking back through cafeteria through hospital to rehab gym with tripod base cane;  Standing in cafeteria with chair support: BLE heel/toe raises to help reduce foot drag x10 reps    Seated hamstring curl x15 reps each LE, green tband;     NMR:    Standing with CGA next to support surface:  Airex pad:  Forward step ups firm to foam unsupported x5 reps each; Alternate toe taps to 1st  step (6 inch step) x10 reps, unsupported, each LE with min A for safety; Standing one foot on airex, one foot on 6 inch step:  Unsupported x30 sec x1 rep each;    Pt educated throughout session about proper  posture and technique with exercises. Improved exercise technique, movement at target joints, use of target muscles after min to mod verbal, visual, tactile cues. Rationale for Evaluation and Treatment Rehabilitation                           PT Education - 10/08/21 1417     Education Details exercise technique/positioning;    Person(s) Educated Patient    Methods Explanation;Verbal cues    Comprehension Verbalized understanding;Returned demonstration;Verbal cues required;Need further instruction              PT Short Term Goals - 09/10/21 1354       PT SHORT TERM  GOAL #1   Title Patient will be adherent to HEP at least 3x a week to improve functional strength and balance for better safety at home.    Baseline 9/21: Pt reports compliance with HEP and is confident, 1/3: doing exercise 2x a week; 06/25/21: 3x/week, 5/18: consistent;    Time 4    Period Weeks    Status Achieved    Target Date 06/25/21      PT SHORT TERM GOAL #2   Title Patient will be independent in transferring sit<>Stand without pushing on arm rests to improve ability to get up from chair.    Baseline 9/21: pt able to complete STS without use of UEs, 1/3:able to stand but has moderate difficulty requiring increased time; 06/25/21: able to perform indep without hands but requiring use of momentum to attain standing. Remains stable. 5/18: able to stand without UE assist, close supervision, remains stable;    Time 4    Period Weeks    Status Achieved    Target Date 07/23/21               PT Long Term Goals - 09/10/21 1357       PT LONG TERM GOAL #1   Title Patient (> 80 years old) will complete five times sit to stand test in < 15 seconds indicating an increased LE strength and improved balance.    Baseline 9/21: 29 seconds hands-free 10/10: deferred 10/12: 34 sec hands free; 11/2: 23.8 sec hands-free; 12/7: 33.2 sec hands free, 1/3: 33.92 hands free; 06/25/21: 25.69 sec, 4/13: 15.5 sec  hands free, 5/18: 23 sec without UE    Time 8    Period Weeks    Status Partially Met    Target Date 10/20/21      PT LONG TERM GOAL #2   Title Patient will increase six minute walk test distance to >400 feet for improve gait ability    Baseline 9/21: 368 ft with RW; 10/10: deferred 10/12: 408 ft; 11/2: 476 ft with 4WW, 1/3: 475 feet with RW; 4/13: 450 feet without AD CGA    Time 8    Period Weeks    Status Achieved    Target Date 10/20/21      PT LONG TERM GOAL #3   Title Patient will increase 10 meter walk test to >1.59ms as to improve gait speed for better community ambulation and to reduce fall risk.    Baseline 9/21: 0.5 m/s with RW; 10/10 deferred 10/12: 0.93 m/s with 41SW 11/2: 0.83 m/s with 41UX 12/7: 0.52 m/s with RW, 1/3: 0.704; 06/25/21: 0.55 m/s with SPC. 5/18: 0.4 m/s without AD    Time 8    Period Weeks    Status On-going    Target Date 10/20/21      PT LONG TERM GOAL #4   Title Patient will be independent with ascend/descend 6 steps using single UE in step over step pattern without LOB.    Baseline 9/21:  Patient uses PT stairs (4 steps) with bilateral upper extremity support on handrails for both ascending/descending.  Patient exhibits reciprocal pattern ascending and step to pattern descending. 10/10: deferred; 10/12: ascending/descending recip. steps with BUE support; 11/2: Ascended/descended 8 steps total, reciprocal pattern ascending and step-to descending. Pt uses UUE support throughout with CGA assist.; 12/7: Ascended and descended 8 steps total with UUE support and CGA. Reciprocal stepping ascending and step to descending., 1/3: Deferrred; 06/25/21: able to perform with SUE support and reciprocal pattern asc/desc, but  does require close CGA with descending. 5/18: independent with 2 handrails, close supervision with 1 handrai;    Time 8    Period Weeks    Status Partially Met    Target Date 10/20/21      PT LONG TERM GOAL #5   Title Patient will demonstrate an  improved Berg Balance Score of >45/56 as to demonstrate improved balance with ADLs such as sitting/standing and transfer balance and reduced fall risk.    Baseline 9/21: deferred d/t time; 9/29: 42/56; 10/10: deferred; 10/12: 44/56; 11/2: deferred; 11/7: 46/56, 1/3: deferred    Time 8    Period Weeks    Status Achieved    Target Date 10/20/21      PT LONG TERM GOAL #6   Title Patient will improve FOTO score to >60% to indicate improved functional mobility with ADLs.    Baseline 9/21: 59; 10/10: 57; 11/2: 60; 12/7: 58%, 1/3: 61%, 4/13: 56%    Time 8    Period Weeks    Status On-going    Target Date 10/20/21      PT LONG TERM GOAL #7   Title Pt will improve FGA by a minimum of 5 points to indicate clinically significant improvement in reduced risk of falls with walking tasks.    Baseline 06/25/21: 12, 4/13: deferred, 5/18: deferred due to increased ankle discomfort;    Time 8    Period Weeks    Status New    Target Date 10/20/21                   Plan - 10/08/21 1439     Clinical Impression Statement Patient motivated and participated well within session. Progressed gait to outside in thick grass. Patient utilized tripod base cane. She was able to tolerate increased distance with less fatigue this session. She does require CGA to min A for safety. Patient ocassionally mis-steps and exhibits increased RLE foot drag. Patient was able to ambulate longer distance today with less fatigue. Patient does exhibit increased challenge while on airex pad unsupported requiring CGA to min A for safety. She exhiits decreased ankle strategies often relying on hip strategies for stance control. She would benefit from additional skilled PT Intervention to improve strength, balance and mobility;    Personal Factors and Comorbidities Age    Comorbidities HTN, CKD, fall risk, type 2 diabetes,    Examination-Activity Limitations Lift;Locomotion Level;Squat;Stairs;Stand;Toileting;Transfers;Bathing     Examination-Participation Restrictions Cleaning;Community Activity;Laundry;Meal Prep;Shop;Volunteer;Driving    Stability/Clinical Decision Making Stable/Uncomplicated    Rehab Potential Fair    PT Frequency 2x / week    PT Duration 8 weeks    PT Treatment/Interventions Cryotherapy;Electrical Stimulation;Moist Heat;Gait training;Stair training;Functional mobility training;Therapeutic activities;Therapeutic exercise;Neuromuscular re-education;Balance training;Patient/family education;Orthotic Fit/Training;Energy conservation    PT Next Visit Plan work on LE strengthening and balance; gait training, endurance, continue POC as previously indicated    PT Home Exercise Plan Access Code: [email or web address]; no updates    Consulted and Agree with Plan of Care Patient             Patient will benefit from skilled therapeutic intervention in order to improve the following deficits and impairments:  Abnormal gait, Decreased balance, Decreased endurance, Decreased mobility, Difficulty walking, Cardiopulmonary status limiting activity, Decreased activity tolerance, Decreased coordination, Decreased strength  Visit Diagnosis: Other lack of coordination  Muscle weakness (generalized)  Apraxia  Difficulty in walking, not elsewhere classified  Unsteadiness on feet  Other abnormalities of gait and mobility  Abnormality of gait  and mobility     Problem List Patient Active Problem List   Diagnosis Date Noted   Carotid stenosis, symptomatic, with infarction ([place]) 11/13/2020   Gout flare 11/10/2020   UTI (urinary tract infection) 11/10/2020   Left carotid artery stenosis 11/10/2020   Chronic kidney disease 11/10/2020   Left basal ganglia embolic stroke ([place]) 01/77/9390   PAC (premature atrial contraction)    Pre-procedural cardiovascular examination    Ischemic stroke ([place]) 10/20/2020   TIA (transient ischemic attack) 10/19/2020   Right hemiparesis ([place]) 10/19/2020   Type 2 diabetes mellitus with  stage 3 chronic kidney disease, without long-term current use of insulin ([place]) 08/24/2018   Hyperlipidemia, unspecified 07/19/2017   Hypertension 07/19/2017   PUD (peptic ulcer disease) 07/19/2017   Type 2 diabetes mellitus ([place]) 07/19/2017   Personal history of gout 08/12/2016   Anemia, unspecified 04/09/2016   Hypothyroidism, acquired 12/10/2015    Sahvanna Mcmanigal, PT, DPT 10/08/2021, 3:19 PM  [place] MAIN [facility] SERVICES [place], [place], [address] Phone: [phone]   Fax:  [phone]  Name: Gusta Marksberry MRN: [id number] Date of Birth: 11/05/1928

## 2021-10-08 NOTE — Therapy (Signed)
[place] MAIN [facility] SERVICES [place], [place], [address] Phone: [phone]   Fax:  [phone]  Occupational Therapy Treatment  Patient Details  Name: Cassandra Schaefer MRN: [id number] Date of Birth: 08-31-1928 Referring Provider (OT): Dr. Tracie Harrier   Encounter Date: 10/08/2021   OT End of Session - 10/08/21 1258     Visit Number 13    Number of Visits 88    Date for OT Re-Evaluation 12/28/21    Authorization Time Period Reporting period starting 09/03/2021    OT Start Time 1300    OT Stop Time 1345    OT Time Calculation (min) 45 min    Equipment Utilized During Treatment tranport chair    Activity Tolerance Patient tolerated treatment well    Behavior During Therapy WFL for tasks assessed/performed             Past Medical History:  Diagnosis Date   Anemia    Cough    RESOLVING, NO FEVER   Diabetes mellitus without complication ([place])    Gout    Hypertension    Hypothyroidism    PUD (peptic ulcer disease)    Stroke [facility])    Wears dentures    full upper, partial lower    Past Surgical History:  Procedure Laterality Date   AMPUTATION TOE Left 12/14/2017   Procedure: AMPUTATION TOE-4TH MPJ;  Surgeon: Samara Deist, DPM;  Location: [place];  Service: Podiatry;  Laterality: Left;  IVA LOCAL Diabetic - oral meds   APPENDECTOMY     CATARACT EXTRACTION W/PHACO Right 06/23/2015   Procedure: CATARACT EXTRACTION PHACO AND INTRAOCULAR LENS PLACEMENT (IOC);  Surgeon: Estill Cotta, MD;  Location: [facility];  Service: Ophthalmology;  Laterality: Right;  [place] 01:28 AP% 23.4 CDE 36.14 fluid pack lot # [id number]   CATARACT EXTRACTION W/PHACO Left 07/28/2015   Procedure: CATARACT EXTRACTION PHACO AND INTRAOCULAR LENS PLACEMENT (IOC);  Surgeon: Estill Cotta, MD;  Location: [facility];  Service: Ophthalmology;  Laterality: Left;  [place]    1:29.7 AP%  24.9 CDE   41.32 fluid casette lot #[id number] H exp  09/23/2016   COONOSCOPY     AND ENDOSCOPY   DILATION AND CURETTAGE OF UTERUS     ENDARTERECTOMY Left 11/13/2020   Procedure: Evacuation of left neck hematoma;  Surgeon: Katha Cabal, MD;  Location: [facility];  Service: Vascular;  Laterality: Left;   ENDARTERECTOMY Left 11/13/2020   Procedure: ENDARTERECTOMY CAROTID;  Surgeon: Algernon Huxley, MD;  Location: [facility];  Service: Vascular;  Laterality: Left;   TONSILLECTOMY     TUBAL LIGATION      There were no vitals filed for this visit.   Subjective Assessment - 10/08/21 1257     Subjective  Pt reports helping with the laundry yesterday    Pertinent History R ankle pain, gout, T2DM, HTN, CKDIII; R/L carotid endarectomy    Patient Stated Goals "I want to improve my aim when I'm using my R arm."    Currently in Pain? No/denies    Pain Onset In the past 7 days              Neuromuscular Re-education  Pt worked on grading pressure squeezing toothpaste into various sized circles. Requires increased time to complete with divided attention - music playing in background and pt naming fruit in alphabetical order. Pt alternated between weighted water bottle and plastic cup with appropriate force ~80% (pt reports practicing at home). Pt worked on grasping  [place] stone from bowled game board and dropping 1 stone at a time while maintaining grasp on 3-13 other stones in palm. Requires multiple attempts to pick up greater than 5 stones, cues for supination. Noted R shoulder hiking increased with shoulder flexion and supination.           OT Education - 10/08/21 1258     Education Details HEP    Person(s) Educated Patient    Methods Explanation;Verbal cues;Demonstration;Tactile cues    Comprehension Verbalized understanding;Verbal cues required;Returned demonstration              OT Short Term Goals - 03/23/21 1657       OT SHORT TERM GOAL #1   Title Pt will perform HEP for RUE strength/coordination independently.     Baseline Eval: HEP not yet established in outpatient, but pt is working with putty from [name]; 12/04/2020; [facility] HEP initiated this day with mod vc after return demo; 01/05/2021: indep with currently program, but ongoing as pt progresses; 01/28/2021: vc to revisit and focus on grip and pinch strengthening with putty; 03/23/21: pt is indep with HEP    Time 6    Period Weeks    Status Achieved    Target Date 03/23/21               OT Long Term Goals - 10/06/21 1320       OT LONG TERM GOAL #1   Title Pt will improve hand writing to 100% legibility with R hand to be able to independently write a check.    Baseline Eval: signature is 75% legible with R hand, requiring extra time; 12/04/2020: no chan to complete.ge from eval; 01/05/2021: Printing is 100% legible, signature is 90% legible, but still requires extra time and effort.; 01/26/21: Signature is 90% legible and speed/fluidity have improved. 20th visit: 90% legible, 03/23/21: 90% legible, extra time 40th visit 90 % legibility with increased time, 50th visit: 90% legible with with increased time required 07/14/2021: 90% legibility. 07/30/2021: 90% legibility, pt. presses too hard through the pen, and pencil. 09/03/2021: 90% legibility; 10/06/21: Increased time, but pt states she signs cards and writes short notes on a card sufficiently.  Pt states she has no need to write checks anymore as her family assists with this and she has no desire to return to this, but she would like to work on her pressure modulation.  New goal below.    Time 12    Period Weeks    Status Partially Met    Target Date 12/28/21      OT LONG TERM GOAL #2   Title Pt will improve R hand coordination to enable good manipulation of iphone using R dominant hand    Baseline Eval: Pt uses L hand d/t R hand lacking sufficient [place] to press buttons on phone; 12/04/2020: no change from eval; 01/05/2021: R hand coordination is improving but pt still uses L hand to dial phone; 01/28/21: Pt using  R dominant hand to press buttons and apps on phone 50% of the time.20th visit: Right hand [facility] skilols are improving, Pt. is improving with manipulation of the iphone; 03/23/21: Pt now consistently using R hand to press buttons on phone with good accuracy.40th visit: Pt. continues to have difficulty modifying the accurate amount of pressure needed to wipe the phone. 50th visit: Pt. is improving, however has difficulty modulating the amount of pressure needed for swiping the phone, as pt. swipes too hard. 07/16/2021: Pt. is now able to  manipulate buttons on the Iphone to make calls.    Time 12    Period Weeks    Status Achieved    Target Date 07/14/21      OT LONG TERM GOAL #3   Title Pt will improve GMC throughout RUE to enable pt to reach for and pick up ADL supplies with R dominant hand without dropping or knocking over objects.    Baseline Eval: Pt reports RUE is clumsy and easily knocks over objects when trying to reach for ADL supplies.  Pt verbalizes that her "aim is off."; 12/04/2020: pt reports slight improvement since eval and has started using her R hand to eat, but still feels clumsy; 01/05/2021: Greatly improved; pt is using silverware to eat with R hand, can comb hair with RUE, still mildly ataxic; 01/26/21: pt reports difficulty picking up objects within a small space (ie; may knock the toothpaste holder down when reaching for toothbrush), but reaching for light/larger objects is improving 20th visit: pt. continues to be mildly ataxic, however is consistently improving with motor control, and [place] while reaching targets; 03/23/21: Pt reaches with accuracy towards targets if she takes her time (pt drops and knocks things over when moving at a faster/normal pace) 04/23/2021: Pt. continues to present with difficulty with depth perception, and impaired [place].40th visit: Pt. is improving, however tends to knock over lighter weight objects.50th visit: Pt. has improved with reaching for items without knocking  them over, however continues to need work on accuracy.07/14/2021: Pt. is improving while motor control, and accuracy of reaching for objects. Pt. continues to work towards improving efficiency with reaching for objects. 07/30/2021: Pt. is able to able to reach for objects more accurately, however continues to present with limited motor control in the Right. 09/03/2021: Pt. Pt. is improving with reaching up without knocking items over. pt. is now able to stand at the sink and reach up without dropping items; 10/06/21: pt estimates she reaches with 85-90% accurate with RUE, occasional dropping    Time 12    Period Weeks    Status On-going    Target Date 12/28/21      OT LONG TERM GOAL #4   Title Pt will improve FOTO score to 68 or better to indicate measurable functional improvement.    Baseline Eval: FOTO 55; 01/05/2021: FOTO 61; 01/28/21: FOTO 61, 20th visit: 61; 03/23/21: FOTO 60 4oth visit: FOTO score: 61, 50th visit: FOTO 69 07/30/2021: FOTO 71  09/03/2021: FOTO 73; 10/06/21: 73    Time 12    Period Weeks    Status On-going    Target Date 12/28/21      OT LONG TERM GOAL #5   Title Pt will perform dynamic standing tasks with modified indep, AD as needed in order to reduce fall risk with ADLs    Baseline Eval: CGA-min A for all dynamic standing tasks using RW; 12/04/2020: pt is using RW in home with distant supv, but family still provides close supv on stairs; 01/05/2021: Pt now using RW to amb in and around the house, can perform ADLs with RW and modified indep, and can manage stairs at home with modified indep.  No falls reported.    Time 12    Period Weeks    Status Achieved    Target Date 02/01/21      Long Term Additional Goals   Additional Long Term Goals Yes      OT LONG TERM GOAL #6   Title Pt. will improve right  hand [facility] skills to be able to indepedendently manipulate and set-up her medication/pills in the pillbox.    Baseline 07/14/2021: Pt. has difficulty manipulating small pills, and  often drops them. 07/30/2021: Pt. continues to have difficulty grasping pills/medication for set-up of pillbox. 09/03/2021: Pt. is able to grasp pills from the pillbox without dropping them; 10/06/21: modified indep (with extra time to complete).    Time 12    Period Weeks    Status Partially Met    Target Date 12/28/21      OT LONG TERM GOAL #7   Title Pt will improve accuracy with graded force through RUE during functional tasks per self report.    Baseline 10/06/21: Pt reports she holds her greatgrandchild too tightly, scratches her arm too forcefully, and sometimes writes with too much pressure; pt struggles to grade force for functional activities.    Time 12    Period Weeks    Status New    Target Date 12/28/21                   Plan - 10/08/21 1258     Clinical Impression Statement Pt continues to present with difficulty modulating the right UE grasp on light items, reports practicing with water bottle/cups at home. Improved graded force without distractions. Improved in hand manipulation while maintianig 3-13 stones in hand during [place]cala. Pt continues to benefit from OT to improve R hand dexterity for I/ADLs.    OT Occupational Profile and History Detailed Assessment- Review of Records and additional review of physical, cognitive, psychosocial history related to current functional performance    Occupational performance deficits (Please refer to evaluation for details): ADL's;Leisure;IADL's    Body Structure / Function / Physical Skills ADL;Coordination;Endurance;GMC;UE functional use;Balance;Sensation;Body mechanics;IADL;Pain;Dexterity;FMC;Strength;Gait;Mobility    Rehab Potential Good    Clinical Decision Making Several treatment options, min-mod task modification necessary    Comorbidities Affecting Occupational Performance: May have comorbidities impacting occupational performance    Modification or Assistance to Complete Evaluation  Min-Moderate modification of tasks or  assist with assess necessary to complete eval    OT Frequency 2x / week    OT Duration 12 weeks    OT Treatment/Interventions Self-care/ADL training;Therapeutic exercise;DME and/or AE instruction;Functional Mobility Training;Balance training;Neuromuscular education;Therapeutic activities;Patient/family education    Family Member Consulted daughter, Olivia Mackie             Patient will benefit from skilled therapeutic intervention in order to improve the following deficits and impairments:   Body Structure / Function / Physical Skills: ADL, Coordination, Endurance, GMC, UE functional use, Balance, Sensation, Body mechanics, IADL, Pain, Dexterity, FMC, Strength, Gait, Mobility       Visit Diagnosis: Other lack of coordination  Muscle weakness (generalized)  Apraxia  Left basal ganglia embolic stroke [facility])    Problem List Patient Active Problem List   Diagnosis Date Noted   Carotid stenosis, symptomatic, with infarction ([place]) 11/13/2020   Gout flare 11/10/2020   UTI (urinary tract infection) 11/10/2020   Left carotid artery stenosis 11/10/2020   Chronic kidney disease 11/10/2020   Left basal ganglia embolic stroke ([place]) 14/48/1856   PAC (premature atrial contraction)    Pre-procedural cardiovascular examination    Ischemic stroke ([place]) 10/20/2020   TIA (transient ischemic attack) 10/19/2020   Right hemiparesis ([place]) 10/19/2020   Type 2 diabetes mellitus with stage 3 chronic kidney disease, without long-term current use of insulin ([place]) 08/24/2018   Hyperlipidemia, unspecified 07/19/2017   Hypertension 07/19/2017   PUD (peptic ulcer  disease) 07/19/2017   Type 2 diabetes mellitus ([place]) 07/19/2017   Personal history of gout 08/12/2016   Anemia, unspecified 04/09/2016   Hypothyroidism, acquired 12/10/2015   Dessie Coma, M.S. OTR/L  10/08/21, 1:39 PM  ascom [phone]   [place] MAIN [facility] SERVICES [address] [place],  [place], [address] Phone: [phone]   Fax:  [phone]  Name: Cassandra Schaefer MRN: [id number] Date of Birth: 1928/10/02

## 2021-10-13 ENCOUNTER — Ambulatory Visit: Payer: Medicare HMO | Admitting: Physical Therapy

## 2021-10-13 ENCOUNTER — Ambulatory Visit: Payer: Medicare HMO | Admitting: Occupational Therapy

## 2021-10-13 ENCOUNTER — Encounter: Payer: Self-pay | Admitting: Physical Therapy

## 2021-10-13 DIAGNOSIS — R482 Apraxia: Secondary | ICD-10-CM

## 2021-10-13 DIAGNOSIS — R2681 Unsteadiness on feet: Secondary | ICD-10-CM

## 2021-10-13 DIAGNOSIS — R269 Unspecified abnormalities of gait and mobility: Secondary | ICD-10-CM

## 2021-10-13 DIAGNOSIS — M6281 Muscle weakness (generalized): Secondary | ICD-10-CM

## 2021-10-13 DIAGNOSIS — R2689 Other abnormalities of gait and mobility: Secondary | ICD-10-CM

## 2021-10-13 DIAGNOSIS — R278 Other lack of coordination: Secondary | ICD-10-CM

## 2021-10-13 DIAGNOSIS — R262 Difficulty in walking, not elsewhere classified: Secondary | ICD-10-CM

## 2021-10-13 NOTE — Therapy (Signed)
OUTPATIENT PHYSICAL THERAPY TREATMENT NOTE   Patient Name: Cassandra Schaefer MRN: [id number] DOB:12-28-1928, 88 y.o., female Today's Date: 10/13/2021  PCP: Roetta Sessions MD REFERRING PROVIDER: Roetta Sessions MD   PT End of Session - 10/13/21 1351     Visit Number 48    Number of Visits 11    Date for PT Re-Evaluation 10/20/21    Authorization Type [employer]    PT Start Time 6269    PT Stop Time 1430    PT Time Calculation (min) 42 min    Equipment Utilized During Treatment Gait belt    Activity Tolerance Patient tolerated treatment well    Behavior During Therapy WFL for tasks assessed/performed             Past Medical History:  Diagnosis Date   Anemia    Cough    RESOLVING, NO FEVER   Diabetes mellitus without complication ([place])    Gout    Hypertension    Hypothyroidism    PUD (peptic ulcer disease)    Stroke [facility])    Wears dentures    full upper, partial lower   Past Surgical History:  Procedure Laterality Date   AMPUTATION TOE Left 12/14/2017   Procedure: AMPUTATION TOE-4TH MPJ;  Surgeon: Samara Deist, DPM;  Location: [facility];  Service: Podiatry;  Laterality: Left;  IVA LOCAL Diabetic - oral meds   APPENDECTOMY     CATARACT EXTRACTION W/PHACO Right 06/23/2015   Procedure: CATARACT EXTRACTION PHACO AND INTRAOCULAR LENS PLACEMENT (IOC);  Surgeon: Estill Cotta, MD;  Location: [facility];  Service: Ophthalmology;  Laterality: Right;  [place] 01:28 AP% 23.4 CDE 36.14 fluid pack lot # [id number]   CATARACT EXTRACTION W/PHACO Left 07/28/2015   Procedure: CATARACT EXTRACTION PHACO AND INTRAOCULAR LENS PLACEMENT (IOC);  Surgeon: Estill Cotta, MD;  Location: [facility];  Service: Ophthalmology;  Laterality: Left;  [place]    1:29.7 AP%  24.9 CDE   41.32 fluid casette lot #[id number] H exp 09/23/2016   COONOSCOPY     AND ENDOSCOPY   DILATION AND CURETTAGE OF UTERUS     ENDARTERECTOMY Left 11/13/2020   Procedure: Evacuation of left neck hematoma;  Surgeon: Katha Cabal, MD;  Location: [facility];  Service: Vascular;  Laterality: Left;   ENDARTERECTOMY Left 11/13/2020   Procedure: ENDARTERECTOMY CAROTID;  Surgeon: Algernon Huxley, MD;  Location: [facility];  Service: Vascular;  Laterality: Left;   TONSILLECTOMY     TUBAL LIGATION     Patient Active Problem List   Diagnosis Date Noted   Carotid stenosis, symptomatic, with infarction ([place]) 11/13/2020   Gout flare 11/10/2020   UTI (urinary tract infection) 11/10/2020   Left carotid artery stenosis 11/10/2020   Chronic kidney disease 11/10/2020   Left basal ganglia embolic stroke ([place]) 38/18/2993   PAC (premature atrial contraction)    Pre-procedural cardiovascular examination    Ischemic stroke ([place]) 10/20/2020   TIA (transient ischemic attack) 10/19/2020   Right hemiparesis ([place]) 10/19/2020   Type 2 diabetes mellitus with stage 3 chronic kidney disease, without long-term current use of insulin ([place]) 08/24/2018   Hyperlipidemia, unspecified 07/19/2017   Hypertension 07/19/2017   PUD (peptic ulcer disease) 07/19/2017   Type 2 diabetes mellitus ([place]) 07/19/2017   Personal history of gout 08/12/2016   Anemia, unspecified 04/09/2016   Hypothyroidism, acquired 12/10/2015    REFERRING DIAG: CVA with right hemiparesis  THERAPY DIAG:  Other lack of coordination  Muscle weakness (generalized)  Apraxia  Difficulty in walking, not  elsewhere classified  Unsteadiness on feet  Other abnormalities of gait and mobility  Abnormality of gait and mobility  Rationale for Evaluation and Treatment Rehabilitation  PERTINENT HISTORY: 88 y.o. female with medical history significant for type 2 diabetes mellitus, essential hypertension, acquired hypothyroidism, hyperlipidemia, stage III chronic kidney disease reports increased right sided weakness on 10/19/20. She was diagnosed with left basal ganglia ischemic stroke. She was discharged to inpatient rehab for 10 days and then discharged home. Patient was  evaluated by outpatient PT on 11/12/20. CVA was due to carotid stenosis. She underwent left carotid endartectomy on 7/21. Procedure went well however patient suffered from left cervical hematoma and had subsequent surgical procedure to stop the bleed and remove the hematoma on 11/13/20. They were concerned with her breathing and therefore intubated her for 1 day, she was in ICU for 2 days and transferred to med/surge on 11/15/20. Patient was discharged home on 11/17/20 and is now returning to outpatient PT. She has had the stiches removed and is healing well. Denies any neck pain or stiffness. She lives with her daughter and has 24/7 caregiver support. She is still using RW for most ambulation. She is able to transfer to bedside commode well and is able to stand short time unsupported. She reports feeling very weak and fatigued. She reports she has been dragging her right foot more. She denies any numbness/tingling; She reports feeling like her legs are wanting to do more but is hesitant because of fear of falling. She is mod I for self care morning routine. She does still receive help with showering. She has been working on increasing her activity but denies any formal exercise.   PRECAUTIONS: Fall risk  SUBJECTIVE: Pt reports doing well. She was able to plant some flowers this weekend before the rain.   PAIN:  Are you having pain? No   TODAY'S TREATMENT:  10/13/21 Warm up on Nustep, BUE/BLE level 2, x4 min with cues for steps per minute >70 to challenge cardiovascular conditioning;   Leg press: BLE 40# x20 BLE 55# x5 reps Tolerated well, reports moderate difficulty but no pain. Able to tolerate increased resistance compared to previous sessions;      Seated hamstring curl x15 reps each LE, green tband;      NMR:  Standing in parallel bars: Forward/backward step airex to airex unsupported x10 reps Forward lunge on airex with 2 rail assist x5 reps each foot in front Standing one foot on each  airex:  Unsupported  Single UE ball pass side/side x5 reps each foot in front; Forward step from airex to airex over orange hurdle x5 reps each foot with 2-1 rail assist; Side stepping airex to airex over orange hurdle with 1 rail assist x10 reps; Patient required CGA to min A for safety;   Standing on airex pad: -tapping foot to stepping stone #4 x2 sets with 2-1 rail assist,  -Progressed to multi-directional stepping with therapist calling out color of stepping stone x1 min each foot  Pt does require min A for safety. She exhibits increased apraxia with difficulty achieving targets with RLE. She also exhibits increased challenge stepping with LLE due to imbalance with right single leg stance;    Pt ambulated out of gym up to medical mall entrance (approx 500 feet) with tripod base cane, CGA for safety with less RLE foot drag and improved gait mechanics.    Pt educated throughout session about proper posture and technique with exercises. Improved exercise technique, movement at target  joints, use of target muscles after min to mod verbal, visual, tactile cues.          PATIENT EDUCATION: Education details: LE strengthening/balance Person educated: Patient Education method: Explanation, Demonstration, and Verbal cues Education comprehension: verbalized understanding, returned demonstration, and needs further education   HOME EXERCISE PROGRAM: Continue as previously given;    PT Short Term Goals - 09/10/21 1354       PT SHORT TERM GOAL #1   Title Patient will be adherent to HEP at least 3x a week to improve functional strength and balance for better safety at home.    Baseline 9/21: Pt reports compliance with HEP and is confident, 1/3: doing exercise 2x a week; 06/25/21: 3x/week, 5/18: consistent;    Time 4    Period Weeks    Status Achieved    Target Date 06/25/21      PT SHORT TERM GOAL #2   Title Patient will be independent in transferring sit<>Stand without pushing on arm  rests to improve ability to get up from chair.    Baseline 9/21: pt able to complete STS without use of UEs, 1/3:able to stand but has moderate difficulty requiring increased time; 06/25/21: able to perform indep without hands but requiring use of momentum to attain standing. Remains stable. 5/18: able to stand without UE assist, close supervision, remains stable;    Time 4    Period Weeks    Status Achieved    Target Date 07/23/21              PT Long Term Goals - 09/10/21 1357       PT LONG TERM GOAL #1   Title Patient (> 25 years old) will complete five times sit to stand test in < 15 seconds indicating an increased LE strength and improved balance.    Baseline 9/21: 29 seconds hands-free 10/10: deferred 10/12: 34 sec hands free; 11/2: 23.8 sec hands-free; 12/7: 33.2 sec hands free, 1/3: 33.92 hands free; 06/25/21: 25.69 sec, 4/13: 15.5 sec hands free, 5/18: 23 sec without UE    Time 8    Period Weeks    Status Partially Met    Target Date 10/20/21      PT LONG TERM GOAL #2   Title Patient will increase six minute walk test distance to >400 feet for improve gait ability    Baseline 9/21: 368 ft with RW; 10/10: deferred 10/12: 408 ft; 11/2: 476 ft with 4WW, 1/3: 475 feet with RW; 4/13: 450 feet without AD CGA    Time 8    Period Weeks    Status Achieved    Target Date 10/20/21      PT LONG TERM GOAL #3   Title Patient will increase 10 meter walk test to >1.9ms as to improve gait speed for better community ambulation and to reduce fall risk.    Baseline 9/21: 0.5 m/s with RW; 10/10 deferred 10/12: 0.93 m/s with 43JS 11/2: 0.83 m/s with 42GB 12/7: 0.52 m/s with RW, 1/3: 0.704; 06/25/21: 0.55 m/s with SPC. 5/18: 0.4 m/s without AD    Time 8    Period Weeks    Status On-going    Target Date 10/20/21      PT LONG TERM GOAL #4   Title Patient will be independent with ascend/descend 6 steps using single UE in step over step pattern without LOB.    Baseline 9/21:  Patient uses PT  stairs (4 steps) with bilateral upper extremity support on  handrails for both ascending/descending.  Patient exhibits reciprocal pattern ascending and step to pattern descending. 10/10: deferred; 10/12: ascending/descending recip. steps with BUE support; 11/2: Ascended/descended 8 steps total, reciprocal pattern ascending and step-to descending. Pt uses UUE support throughout with CGA assist.; 12/7: Ascended and descended 8 steps total with UUE support and CGA. Reciprocal stepping ascending and step to descending., 1/3: Deferrred; 06/25/21: able to perform with SUE support and reciprocal pattern asc/desc, but does require close CGA with descending. 5/18: independent with 2 handrails, close supervision with 1 handrai;    Time 8    Period Weeks    Status Partially Met    Target Date 10/20/21      PT LONG TERM GOAL #5   Title Patient will demonstrate an improved Berg Balance Score of >45/56 as to demonstrate improved balance with ADLs such as sitting/standing and transfer balance and reduced fall risk.    Baseline 9/21: deferred d/t time; 9/29: 42/56; 10/10: deferred; 10/12: 44/56; 11/2: deferred; 11/7: 46/56, 1/3: deferred    Time 8    Period Weeks    Status Achieved    Target Date 10/20/21      PT LONG TERM GOAL #6   Title Patient will improve FOTO score to >60% to indicate improved functional mobility with ADLs.    Baseline 9/21: 59; 10/10: 57; 11/2: 60; 12/7: 58%, 1/3: 61%, 4/13: 56%    Time 8    Period Weeks    Status On-going    Target Date 10/20/21      PT LONG TERM GOAL #7   Title Pt will improve FGA by a minimum of 5 points to indicate clinically significant improvement in reduced risk of falls with walking tasks.    Baseline 06/25/21: 12, 4/13: deferred, 5/18: deferred due to increased ankle discomfort;    Time 8    Period Weeks    Status New    Target Date 10/20/21              Plan - 10/13/21 1415     Clinical Impression Statement Patient motivated and participated well  within session. She was instructed in advanced LE strengthening, utilizing resistance weight for strengthening. She does require min VCS for correct exercise technique. Patient instructed in advanced balance exercise. Utilized compliant surface to challenge stance control. She continues to have difficulty stepping over obstacles with increased RLE foot drag. She was challenged with multi-directional stepping to various targets. Pt did require min A when standing on airex pad with target stepping. Patient requires intermittent rail assist throughout session and CGA to min A for safety. She was able to ambulate to front of hospital with tripod base cane, CGA with less foot drag and improved gait mechanics. Patient does ambulate with slower gait speed. She would benefit from additional skilled PT intervention to improve strength, balance and mobility;    Personal Factors and Comorbidities Age    Comorbidities HTN, CKD, fall risk, type 2 diabetes,    Examination-Activity Limitations Lift;Locomotion Level;Squat;Stairs;Stand;Toileting;Transfers;Bathing    Examination-Participation Restrictions Cleaning;Community Activity;Laundry;Meal Prep;Shop;Volunteer;Driving    Stability/Clinical Decision Making Stable/Uncomplicated    Rehab Potential Fair    PT Frequency 2x / week    PT Duration 8 weeks    PT Treatment/Interventions Cryotherapy;Electrical Stimulation;Moist Heat;Gait training;Stair training;Functional mobility training;Therapeutic activities;Therapeutic exercise;Neuromuscular re-education;Balance training;Patient/family education;Orthotic Fit/Training;Energy conservation    PT Next Visit Plan work on LE strengthening and balance; gait training, endurance, continue POC as previously indicated    PT Home Exercise Plan Access Code:  [email or web address]; no updates    Consulted and Agree with Plan of Care Patient               Nakhia Levitan, PT, DPT 10/13/2021, 2:43 PM

## 2021-10-15 ENCOUNTER — Encounter: Payer: Self-pay | Admitting: Occupational Therapy

## 2021-10-15 ENCOUNTER — Ambulatory Visit: Payer: Medicare HMO | Admitting: Occupational Therapy

## 2021-10-15 ENCOUNTER — Encounter: Payer: Self-pay | Admitting: Physical Therapy

## 2021-10-15 ENCOUNTER — Ambulatory Visit: Payer: Medicare HMO | Admitting: Physical Therapy

## 2021-10-15 DIAGNOSIS — R278 Other lack of coordination: Secondary | ICD-10-CM

## 2021-10-15 DIAGNOSIS — M6281 Muscle weakness (generalized): Secondary | ICD-10-CM

## 2021-10-15 DIAGNOSIS — R2689 Other abnormalities of gait and mobility: Secondary | ICD-10-CM

## 2021-10-15 DIAGNOSIS — R482 Apraxia: Secondary | ICD-10-CM

## 2021-10-15 DIAGNOSIS — R262 Difficulty in walking, not elsewhere classified: Secondary | ICD-10-CM

## 2021-10-15 DIAGNOSIS — R2681 Unsteadiness on feet: Secondary | ICD-10-CM

## 2021-10-15 DIAGNOSIS — R269 Unspecified abnormalities of gait and mobility: Secondary | ICD-10-CM

## 2021-10-15 NOTE — Therapy (Signed)
[place] MAIN [facility] SERVICES [place], [place], [address] Phone: [phone]   Fax:  [phone]  Occupational Therapy Progress Note  Dates of reporting period  09/03/2021   to   10/15/2021   Patient Details  Name: Cassandra Schaefer MRN: [id number] Date of Birth: 1929-01-22 Referring Provider (OT): Dr. Tracie Harrier   Encounter Date: 10/15/2021   OT End of Session - 10/15/21 1305     Visit Number 26    Number of Visits 114    Date for OT Re-Evaluation 12/28/21    Authorization Time Period Reporting period starting 09/03/2021    OT Start Time 1300    OT Stop Time 1345    OT Time Calculation (min) 45 min    Activity Tolerance Patient tolerated treatment well    Behavior During Therapy WFL for tasks assessed/performed             Past Medical History:  Diagnosis Date   Anemia    Cough    RESOLVING, NO FEVER   Diabetes mellitus without complication ([place])    Gout    Hypertension    Hypothyroidism    PUD (peptic ulcer disease)    Stroke [facility])    Wears dentures    full upper, partial lower    Past Surgical History:  Procedure Laterality Date   AMPUTATION TOE Left 12/14/2017   Procedure: AMPUTATION TOE-4TH MPJ;  Surgeon: Samara Deist, DPM;  Location: [place];  Service: Podiatry;  Laterality: Left;  IVA LOCAL Diabetic - oral meds   APPENDECTOMY     CATARACT EXTRACTION W/PHACO Right 06/23/2015   Procedure: CATARACT EXTRACTION PHACO AND INTRAOCULAR LENS PLACEMENT (IOC);  Surgeon: Estill Cotta, MD;  Location: [facility];  Service: Ophthalmology;  Laterality: Right;  [place] 01:28 AP% 23.4 CDE 36.14 fluid pack lot # [id number]   CATARACT EXTRACTION W/PHACO Left 07/28/2015   Procedure: CATARACT EXTRACTION PHACO AND INTRAOCULAR LENS PLACEMENT (IOC);  Surgeon: Estill Cotta, MD;  Location: [facility];  Service: Ophthalmology;  Laterality: Left;  [place]    1:29.7 AP%  24.9 CDE   41.32 fluid casette lot #[id number] H exp  09/23/2016   COONOSCOPY     AND ENDOSCOPY   DILATION AND CURETTAGE OF UTERUS     ENDARTERECTOMY Left 11/13/2020   Procedure: Evacuation of left neck hematoma;  Surgeon: Katha Cabal, MD;  Location: [facility];  Service: Vascular;  Laterality: Left;   ENDARTERECTOMY Left 11/13/2020   Procedure: ENDARTERECTOMY CAROTID;  Surgeon: Algernon Huxley, MD;  Location: [facility];  Service: Vascular;  Laterality: Left;   TONSILLECTOMY     TUBAL LIGATION      There were no vitals filed for this visit.   Subjective Assessment - 10/15/21 1515     Subjective  Pt. reports that she can tell she has been making progress, and is able to do more with her RUE.    Patient is accompanied by: Family member    Pertinent History R ankle pain, gout, T2DM, HTN, CKDIII; R/L carotid endarectomy    Patient Stated Goals "I want to improve my aim when I'm using my R arm."    Currently in Pain? No/denies                [facility] OT Assessment - 10/15/21 1308       Coordination   Right 9 Hole Peg Test 46 sec.      Strength   Overall Strength Comments BUE strength 5/5  overall      Hand Function   Right Hand Grip (lbs) 33    Right Hand Lateral Pinch 13 lbs    Right Hand 3 Point Pinch 10 lbs            Measurements were obtained, and goals were reviewed with the pt. Pt. has made excellent progress overall with the RUE. Pt. has improved with right grip strength, pinch strength, and [place] skills. Pt. is now using her right hand more during ADLs, and IADL tasks at home. Pt. is using her right hand to feed herself, and brush her teeth fully with her right hand. Pt. is now performing morning ADL, tasks. Pt. has improved with motor control skills, and accuracy with reaching. Pt. does experience decreased motor control when reaching progressively higher, and lower. Pt. continues to require increased time to write note cards. Pt. continues to grasp objects with her right hand with too much force. Pt. continues to work on  improving, and maximizing independence with ADLs, and IADL tasks. Pt.'s FOTO score has improved to 77. Pt. continues to work on improving right hand function in order to work towards improving, and maximizing independence with ADLs, and IADL tasks.                     OT Education - 10/15/21 1517     Education Details HEP    Person(s) Educated Patient    Methods Explanation;Verbal cues;Demonstration;Tactile cues    Comprehension Verbalized understanding;Verbal cues required;Returned demonstration              OT Short Term Goals - 03/23/21 1657       OT SHORT TERM GOAL #1   Title Pt will perform HEP for RUE strength/coordination independently.    Baseline Eval: HEP not yet established in outpatient, but pt is working with putty from [name]; 12/04/2020; [facility] HEP initiated this day with mod vc after return demo; 01/05/2021: indep with currently program, but ongoing as pt progresses; 01/28/2021: vc to revisit and focus on grip and pinch strengthening with putty; 03/23/21: pt is indep with HEP    Time 6    Period Weeks    Status Achieved    Target Date 03/23/21               OT Long Term Goals - 10/15/21 1322       OT LONG TERM GOAL #1   Title Pt will improve hand writing to 100% legibility with R hand to be able to independently write a check.    Baseline Eval: signature is 75% legible with R hand, requiring extra time; 12/04/2020: no chan to complete.ge from eval; 01/05/2021: Printing is 100% legible, signature is 90% legible, but still requires extra time and effort.; 01/26/21: Signature is 90% legible and speed/fluidity have improved. 20th visit: 90% legible, 03/23/21: 90% legible, extra time 40th visit 90 % legibility with increased time, 50th visit: 90% legible with with increased time required 07/14/2021: 90% legibility. 07/30/2021: 90% legibility, pt. presses too hard through the pen, and pencil. 09/03/2021: 90% legibility; 10/06/21: Increased time, but pt states she signs  cards and writes short notes on a card sufficiently.  Pt states she has no need to write checks anymore as her family assists with this and she has no desire to return to this, but she would like to work on her pressure modulation.  New goal below. 10/15/2021: Pt. conitnues to require increased time to complete note cards, and corespondence.  Time 12    Period Weeks    Status Partially Met    Target Date 12/28/21      OT LONG TERM GOAL #2   Title Pt will improve R hand coordination to enable good manipulation of iphone using R dominant hand      OT LONG TERM GOAL #3   Title Pt will improve GMC throughout RUE to enable pt to reach for and pick up ADL supplies with R dominant hand without dropping or knocking over objects.    Baseline Eval: Pt reports RUE is clumsy and easily knocks over objects when trying to reach for ADL supplies.  Pt verbalizes that her "aim is off."; 12/04/2020: pt reports slight improvement since eval and has started using her R hand to eat, but still feels clumsy; 01/05/2021: Greatly improved; pt is using silverware to eat with R hand, can comb hair with RUE, still mildly ataxic; 01/26/21: pt reports difficulty picking up objects within a small space (ie; may knock the toothpaste holder down when reaching for toothbrush), but reaching for light/larger objects is improving 20th visit: pt. continues to be mildly ataxic, however is consistently improving with motor control, and [place] while reaching targets; 03/23/21: Pt reaches with accuracy towards targets if she takes her time (pt drops and knocks things over when moving at a faster/normal pace) 04/23/2021: Pt. continues to present with difficulty with depth perception, and impaired [place].40th visit: Pt. is improving, however tends to knock over lighter weight objects.50th visit: Pt. has improved with reaching for items without knocking them over, however continues to need work on accuracy.07/14/2021: Pt. is improving while motor control,  and accuracy of reaching for objects. Pt. continues to work towards improving efficiency with reaching for objects. 07/30/2021: Pt. is able to able to reach for objects more accurately, however continues to present with limited motor control in the Right. 09/03/2021: Pt. Pt. is improving with reaching up without knocking items over. pt. is now able to stand at the sink and reach up without dropping items; 10/06/21: pt estimates she reaches with 85-90% accurate with RUE, occasional dropping. 10/15/2021: Pt. reports 85-90% accureacy, however presents with decreased accuracy the higher, or lower she reaches.    Time 12    Period Weeks    Status On-going    Target Date 12/28/21      OT LONG TERM GOAL #4   Title Pt will improve FOTO score to 68 or better to indicate measurable functional improvement.    Baseline Eval: FOTO 55; 01/05/2021: FOTO 61; 01/28/21: FOTO 61, 20th visit: 61; 03/23/21: FOTO 60 4oth visit: FOTO score: 61, 50th visit: FOTO 69 07/30/2021: FOTO 71  09/03/2021: FOTO 73; 10/06/21: 73 10/15/2021: 77    Time 12    Period Weeks    Status On-going      OT LONG TERM GOAL #6   Title Pt. will improve right hand [facility] skills to be able to indepedendently manipulate and set-up her medication/pills in the pillbox.    Baseline 07/14/2021: Pt. has difficulty manipulating small pills, and often drops them. 07/30/2021: Pt. continues to have difficulty grasping pills/medication for set-up of pillbox. 09/03/2021: Pt. is able to grasp pills from the pillbox without dropping them; 10/06/21: modified indep (with extra time to complete). 10/15/2021: Pt. reports requiring increased time to complete.    Time 12    Period Weeks    Status Partially Met    Target Date 12/28/21      OT LONG TERM GOAL #7  Title Pt will improve accuracy with graded force through RUE during functional tasks per self report.    Baseline 10/06/21: Pt reports she holds her greatgrandchild too tightly, scratches her arm too forcefully, and  sometimes writes with too much pressure; pt struggles to grade force for functional activities.10/15/2021: Pt. continues to grasp items with the left hand too tightly.    Time 12    Period Weeks    Status Achieved                   Plan - 10/15/21 1519     Clinical Impression Statement Measurements were obtained, and goals were reviewed with the pt. Pt. has made excellent progress overall with the RUE. Pt. has improved with right grip strength, pinch strength, and [place] skills. Pt. is now using her right hand more during ADLs, and IADL tasks at home. Pt. is using her right hand to feed herself, and brush her teeth fully with her right hand. Pt. is now performing morning ADL, tasks. Pt. has improved with motor control skills, and accuracy with reaching. Pt. does experience decreased motor control when reaching progressively higher, and lower. Pt. continues to require increased time to write note cards. Pt. continues to grasp objects with her right hand with too much force. Pt. continues to work on improving, and maximizing independence with ADLs, and IADL tasks. Pt.'s FOTO score has improved to 77. Pt. continues to work on improving right hand function in order to work towards improving, and maximizing independence with ADLs, and IADL tasks.    OT Occupational Profile and History Detailed Assessment- Review of Records and additional review of physical, cognitive, psychosocial history related to current functional performance    Occupational performance deficits (Please refer to evaluation for details): ADL's;Leisure;IADL's    Body Structure / Function / Physical Skills ADL;Coordination;Endurance;GMC;UE functional use;Balance;Sensation;Body mechanics;IADL;Pain;Dexterity;FMC;Strength;Gait;Mobility    Rehab Potential Good    Clinical Decision Making Several treatment options, min-mod task modification necessary    Comorbidities Affecting Occupational Performance: May have comorbidities impacting  occupational performance    Modification or Assistance to Complete Evaluation  Min-Moderate modification of tasks or assist with assess necessary to complete eval    OT Frequency 2x / week    OT Duration 12 weeks    OT Treatment/Interventions Self-care/ADL training;Therapeutic exercise;DME and/or AE instruction;Functional Mobility Training;Balance training;Neuromuscular education;Therapeutic activities;Patient/family education    Family Member Consulted daughter, Olivia Mackie             Patient will benefit from skilled therapeutic intervention in order to improve the following deficits and impairments:   Body Structure / Function / Physical Skills: ADL, Coordination, Endurance, GMC, UE functional use, Balance, Sensation, Body mechanics, IADL, Pain, Dexterity, FMC, Strength, Gait, Mobility       Visit Diagnosis: Muscle weakness (generalized)  Other lack of coordination    Problem List Patient Active Problem List   Diagnosis Date Noted   Carotid stenosis, symptomatic, with infarction ([place]) 11/13/2020   Gout flare 11/10/2020   UTI (urinary tract infection) 11/10/2020   Left carotid artery stenosis 11/10/2020   Chronic kidney disease 11/10/2020   Left basal ganglia embolic stroke ([place]) 71/69/6789   PAC (premature atrial contraction)    Pre-procedural cardiovascular examination    Ischemic stroke ([place]) 10/20/2020   TIA (transient ischemic attack) 10/19/2020   Right hemiparesis ([place]) 10/19/2020   Type 2 diabetes mellitus with stage 3 chronic kidney disease, without long-term current use of insulin ([place]) 08/24/2018   Hyperlipidemia, unspecified 07/19/2017  Hypertension 07/19/2017   PUD (peptic ulcer disease) 07/19/2017   Type 2 diabetes mellitus ([place]) 07/19/2017   Personal history of gout 08/12/2016   Anemia, unspecified 04/09/2016   Hypothyroidism, acquired 12/10/2015   Harrel Carina, MS, OTR/L   Harrel Carina, OT 10/15/2021, 3:24 PM  [place] MAIN [facility] SERVICES [address] [place], [place], [address] Phone: [phone]   Fax:  [phone]  Name: Cassandra Schaefer MRN: [id number] Date of Birth: 1929-04-01

## 2021-10-15 NOTE — Therapy (Signed)
OUTPATIENT PHYSICAL THERAPY TREATMENT NOTE Physical Therapy Progress Note   Dates of reporting period  09/10/21   to   10/15/21    Patient Name: Cassandra Schaefer MRN: [id number] DOB:October 26, 1928, 88 y.o., female Today's Date: 10/15/2021  PCP: Roetta Sessions MD REFERRING PROVIDER: Roetta Sessions MD   PT End of Session - 10/15/21 1350     Visit Number 52    Number of Visits 9    Date for PT Re-Evaluation 10/20/21    Authorization Type [employer]    PT Start Time 3500    PT Stop Time 1430    PT Time Calculation (min) 42 min    Equipment Utilized During Treatment Gait belt    Activity Tolerance Patient tolerated treatment well    Behavior During Therapy WFL for tasks assessed/performed             Past Medical History:  Diagnosis Date   Anemia    Cough    RESOLVING, NO FEVER   Diabetes mellitus without complication ([place])    Gout    Hypertension    Hypothyroidism    PUD (peptic ulcer disease)    Stroke [facility])    Wears dentures    full upper, partial lower   Past Surgical History:  Procedure Laterality Date   AMPUTATION TOE Left 12/14/2017   Procedure: AMPUTATION TOE-4TH MPJ;  Surgeon: Samara Deist, DPM;  Location: [facility];  Service: Podiatry;  Laterality: Left;  IVA LOCAL Diabetic - oral meds   APPENDECTOMY     CATARACT EXTRACTION W/PHACO Right 06/23/2015   Procedure: CATARACT EXTRACTION PHACO AND INTRAOCULAR LENS PLACEMENT (IOC);  Surgeon: Estill Cotta, MD;  Location: [facility];  Service: Ophthalmology;  Laterality: Right;  [place] 01:28 AP% 23.4 CDE 36.14 fluid pack lot # [id number]   CATARACT EXTRACTION W/PHACO Left 07/28/2015   Procedure: CATARACT EXTRACTION PHACO AND INTRAOCULAR LENS PLACEMENT (IOC);  Surgeon: Estill Cotta, MD;  Location: [facility];  Service: Ophthalmology;  Laterality: Left;  [place]    1:29.7 AP%  24.9 CDE   41.32 fluid casette lot #[id number] H exp 09/23/2016   COONOSCOPY     AND ENDOSCOPY   DILATION AND CURETTAGE OF UTERUS      ENDARTERECTOMY Left 11/13/2020   Procedure: Evacuation of left neck hematoma;  Surgeon: Katha Cabal, MD;  Location: [facility];  Service: Vascular;  Laterality: Left;   ENDARTERECTOMY Left 11/13/2020   Procedure: ENDARTERECTOMY CAROTID;  Surgeon: Algernon Huxley, MD;  Location: [facility];  Service: Vascular;  Laterality: Left;   TONSILLECTOMY     TUBAL LIGATION     Patient Active Problem List   Diagnosis Date Noted   Carotid stenosis, symptomatic, with infarction ([place]) 11/13/2020   Gout flare 11/10/2020   UTI (urinary tract infection) 11/10/2020   Left carotid artery stenosis 11/10/2020   Chronic kidney disease 11/10/2020   Left basal ganglia embolic stroke ([place]) 67/89/3810   PAC (premature atrial contraction)    Pre-procedural cardiovascular examination    Ischemic stroke ([place]) 10/20/2020   TIA (transient ischemic attack) 10/19/2020   Right hemiparesis ([place]) 10/19/2020   Type 2 diabetes mellitus with stage 3 chronic kidney disease, without long-term current use of insulin ([place]) 08/24/2018   Hyperlipidemia, unspecified 07/19/2017   Hypertension 07/19/2017   PUD (peptic ulcer disease) 07/19/2017   Type 2 diabetes mellitus ([place]) 07/19/2017   Personal history of gout 08/12/2016   Anemia, unspecified 04/09/2016   Hypothyroidism, acquired 12/10/2015    REFERRING DIAG: CVA with right hemiparesis  THERAPY DIAG:  Muscle weakness (generalized)  Other lack of coordination  Apraxia  Difficulty in walking, not elsewhere classified  Unsteadiness on feet  Other abnormalities of gait and mobility  Abnormality of gait and mobility  Rationale for Evaluation and Treatment Rehabilitation  PERTINENT HISTORY: 88 y.o. female with medical history significant for type 2 diabetes mellitus, essential hypertension, acquired hypothyroidism, hyperlipidemia, stage III chronic kidney disease reports increased right sided weakness on 10/19/20. She was diagnosed with left basal ganglia ischemic stroke.  She was discharged to inpatient rehab for 10 days and then discharged home. Patient was evaluated by outpatient PT on 11/12/20. CVA was due to carotid stenosis. She underwent left carotid endartectomy on 7/21. Procedure went well however patient suffered from left cervical hematoma and had subsequent surgical procedure to stop the bleed and remove the hematoma on 11/13/20. They were concerned with her breathing and therefore intubated her for 1 day, she was in ICU for 2 days and transferred to med/surge on 11/15/20. Patient was discharged home on 11/17/20 and is now returning to outpatient PT. She has had the stiches removed and is healing well. Denies any neck pain or stiffness. She lives with her daughter and has 24/7 caregiver support. She is still using RW for most ambulation. She is able to transfer to bedside commode well and is able to stand short time unsupported. She reports feeling very weak and fatigued. She reports she has been dragging her right foot more. She denies any numbness/tingling; She reports feeling like her legs are wanting to do more but is hesitant because of fear of falling. She is mod I for self care morning routine. She does still receive help with showering. She has been working on increasing her activity but denies any formal exercise.   PRECAUTIONS: Fall risk  SUBJECTIVE: Pt reports doing ok. She reports being cold with cooler wet weather. No pain reported; She reports doing HEP at least 1x a day. She reports they are still challenging; She reports when she is watching TV she will only sit for 30 min at a time and then get up and move around;   PAIN:  Are you having pain? No   TODAY'S TREATMENT:  10/15/21 Warm up on Nustep, BUE/BLE level 2, x4 min with cues for steps per minute >70 to challenge cardiovascular conditioning;      TREATMENT: Instructed patient in 6 min walk, 10 meter walk, FOTO etc to address goals;  BP increased after 6 min walk-  Re-assessed after 2 min  rest break: 183/67, HR72  PT assessed BP manually: LUE: 190/62  PT called patient's PCP, unable to speak with nurse, left an urgent message; Patient's daughter was contacted and she picked up patient and will monitor BP    Patient's condition has the potential to improve in response to therapy. Maximum improvement is yet to be obtained. The anticipated improvement is attainable and reasonable in a generally predictable time.  Patient reports adherence with HEP. She is hoping to return to walking without AD     PATIENT EDUCATION: Education details:Progress towards goals/HEP reinforced;  Person educated: Patient Education method: Consulting civil engineer, Demonstration, and Verbal cues Education comprehension: verbalized understanding, returned demonstration, and needs further education   HOME EXERCISE PROGRAM: Continue as previously given;    PT Short Term Goals - 09/10/21 1354       PT SHORT TERM GOAL #1   Title Patient will be adherent to HEP at least 3x a week to improve functional strength and  balance for better safety at home.    Baseline 9/21: Pt reports compliance with HEP and is confident, 1/3: doing exercise 2x a week; 06/25/21: 3x/week, 5/18: consistent;    Time 4    Period Weeks    Status Achieved    Target Date 06/25/21      PT SHORT TERM GOAL #2   Title Patient will be independent in transferring sit<>Stand without pushing on arm rests to improve ability to get up from chair.    Baseline 9/21: pt able to complete STS without use of UEs, 1/3:able to stand but has moderate difficulty requiring increased time; 06/25/21: able to perform indep without hands but requiring use of momentum to attain standing. Remains stable. 5/18: able to stand without UE assist, close supervision, remains stable;    Time 4    Period Weeks    Status Achieved    Target Date 07/23/21              PT Long Term Goals - 09/10/21 1357       PT LONG TERM GOAL #1   Title Patient (> 38 years old) will  complete five times sit to stand test in < 15 seconds indicating an increased LE strength and improved balance.    Baseline 9/21: 29 seconds hands-free 10/10: deferred 10/12: 34 sec hands free; 11/2: 23.8 sec hands-free; 12/7: 33.2 sec hands free, 1/3: 33.92 hands free; 06/25/21: 25.69 sec, 4/13: 15.5 sec hands free, 5/18: 23 sec without UE 6/22: deferred   Time 8    Period Weeks    Status Partially Met    Target Date 10/20/21      PT LONG TERM GOAL #2   Title Patient will increase six minute walk test distance to >400 feet for improve gait ability    Baseline 9/21: 368 ft with RW; 10/10: deferred 10/12: 408 ft; 11/2: 476 ft with 4WW, 1/3: 475 feet with RW; 4/13: 450 feet without AD CGA 10/15/21: 460 feet without AD, CGA   Time 8    Period Weeks    Status Achieved    Target Date 10/20/21      PT LONG TERM GOAL #3   Title Patient will increase 10 meter walk test to >1.42ms as to improve gait speed for better community ambulation and to reduce fall risk.    Baseline 9/21: 0.5 m/s with RW; 10/10 deferred 10/12: 0.93 m/s with 44UJ 11/2: 0.83 m/s with 48JX 12/7: 0.52 m/s with RW, 1/3: 0.704; 06/25/21: 0.55 m/s with SPC. 5/18: 0.4 m/s without AD , 10/15/21 0.507 m/s without AD   Time 8    Period Weeks    Status Partially Met   Target Date 10/20/21      PT LONG TERM GOAL #4   Title Patient will be independent with ascend/descend 6 steps using single UE in step over step pattern without LOB.    Baseline 9/21:  Patient uses PT stairs (4 steps) with bilateral upper extremity support on handrails for both ascending/descending.  Patient exhibits reciprocal pattern ascending and step to pattern descending. 10/10: deferred; 10/12: ascending/descending recip. steps with BUE support; 11/2: Ascended/descended 8 steps total, reciprocal pattern ascending and step-to descending. Pt uses UUE support throughout with CGA assist.; 12/7: Ascended and descended 8 steps total with UUE support and CGA. Reciprocal stepping  ascending and step to descending., 1/3: Deferrred; 06/25/21: able to perform with SUE support and reciprocal pattern asc/desc, but does require close CGA with descending. 5/18: independent with 2  handrails, close supervision with 1 handrai; 10/15/21: requires 2 handrails   Time 8    Period Weeks    Status Partially Met    Target Date 10/20/21      PT LONG TERM GOAL #5   Title Patient will demonstrate an improved Berg Balance Score of >45/56 as to demonstrate improved balance with ADLs such as sitting/standing and transfer balance and reduced fall risk.    Baseline 9/21: deferred d/t time; 9/29: 42/56; 10/10: deferred; 10/12: 44/56; 11/2: deferred; 11/7: 46/56, 1/3: deferred    Time 8    Period Weeks    Status Achieved    Target Date 10/20/21      PT LONG TERM GOAL #6   Title Patient will improve FOTO score to >60% to indicate improved functional mobility with ADLs.    Baseline 9/21: 59; 10/10: 57; 11/2: 60; 12/7: 58%, 1/3: 61%, 4/13: 56% 10/15/21: 58%   Time 8    Period Weeks    Status Partially Met   Target Date 10/20/21      PT LONG TERM GOAL #7   Title Pt will improve FGA by a minimum of 5 points to indicate clinically significant improvement in reduced risk of falls with walking tasks.    Baseline 06/25/21: 12, 4/13: deferred, 5/18: deferred due to increased ankle discomfort; 6/22: deferred due to elevated BP- will assess next visit;    Time 8    Period Weeks    Status New    Target Date 10/20/21              Plan - 10/15/21 1446     Clinical Impression Statement Patient motivated and participated fair within session. She was instructed in outcome measures to address progress towards goals. After 6 min walk, patient's BP was elevated. Had patient rest during survey intake. Patient's BP continued to remain elevated. PT reached out to MD office, left message. Session ended early as patient fatigued and BP elevated. Her daughter picked her up and will monitor her BP. Patient denies  any other symptoms. She does exhibit slight improvement in gait ability compared to last progress note. She would benefit from additional skilled PT Intervention to improve strength, balance and mobility.    Personal Factors and Comorbidities Age    Comorbidities HTN, CKD, fall risk, type 2 diabetes,    Examination-Activity Limitations Lift;Locomotion Level;Squat;Stairs;Stand;Toileting;Transfers;Bathing    Examination-Participation Restrictions Cleaning;Community Activity;Laundry;Meal Prep;Shop;Volunteer;Driving    Stability/Clinical Decision Making Stable/Uncomplicated    Rehab Potential Fair    PT Frequency 2x / week    PT Duration 8 weeks    PT Treatment/Interventions Cryotherapy;Electrical Stimulation;Moist Heat;Gait training;Stair training;Functional mobility training;Therapeutic activities;Therapeutic exercise;Neuromuscular re-education;Balance training;Patient/family education;Orthotic Fit/Training;Energy conservation    PT Next Visit Plan work on LE strengthening and balance; gait training, endurance, continue POC as previously indicated    PT Home Exercise Plan Access Code: [email or web address]; no updates    Consulted and Agree with Plan of Care Patient                Ansley Stanwood, PT, DPT 10/15/2021, 2:48 PM

## 2021-10-19 ENCOUNTER — Other Ambulatory Visit (INDEPENDENT_AMBULATORY_CARE_PROVIDER_SITE_OTHER): Payer: Self-pay | Admitting: Vascular Surgery

## 2021-10-19 ENCOUNTER — Ambulatory Visit (INDEPENDENT_AMBULATORY_CARE_PROVIDER_SITE_OTHER): Payer: Medicare HMO

## 2021-10-19 DIAGNOSIS — I10 Essential (primary) hypertension: Secondary | ICD-10-CM

## 2021-10-20 ENCOUNTER — Ambulatory Visit: Payer: Medicare HMO | Admitting: Physical Therapy

## 2021-10-20 ENCOUNTER — Encounter: Payer: Self-pay | Admitting: Physical Therapy

## 2021-10-20 DIAGNOSIS — R269 Unspecified abnormalities of gait and mobility: Secondary | ICD-10-CM

## 2021-10-20 DIAGNOSIS — R482 Apraxia: Secondary | ICD-10-CM

## 2021-10-20 DIAGNOSIS — M6281 Muscle weakness (generalized): Secondary | ICD-10-CM

## 2021-10-20 DIAGNOSIS — R278 Other lack of coordination: Secondary | ICD-10-CM

## 2021-10-20 DIAGNOSIS — R262 Difficulty in walking, not elsewhere classified: Secondary | ICD-10-CM

## 2021-10-20 DIAGNOSIS — R2689 Other abnormalities of gait and mobility: Secondary | ICD-10-CM

## 2021-10-20 DIAGNOSIS — R2681 Unsteadiness on feet: Secondary | ICD-10-CM

## 2021-10-20 NOTE — Therapy (Signed)
OUTPATIENT PHYSICAL THERAPY TREATMENT NOTE    Patient Name: Cassandra Schaefer MRN: [id number] DOB:Jan 21, 1929, 88 y.o., female Today's Date: 10/21/2021  PCP: Roetta Sessions MD REFERRING PROVIDER: Roetta Sessions MD   PT End of Session - 10/20/21 1353     Visit Number 81    Number of Visits 2    Date for PT Re-Evaluation 10/20/21    Authorization Type [employer]    PT Start Time 1348    PT Stop Time 1430    PT Time Calculation (min) 42 min    Equipment Utilized During Treatment Gait belt    Activity Tolerance Patient tolerated treatment well    Behavior During Therapy WFL for tasks assessed/performed             Past Medical History:  Diagnosis Date   Anemia    Cough    RESOLVING, NO FEVER   Diabetes mellitus without complication ([place])    Gout    Hypertension    Hypothyroidism    PUD (peptic ulcer disease)    Stroke [facility])    Wears dentures    full upper, partial lower   Past Surgical History:  Procedure Laterality Date   AMPUTATION TOE Left 12/14/2017   Procedure: AMPUTATION TOE-4TH MPJ;  Surgeon: Samara Deist, DPM;  Location: [place];  Service: Podiatry;  Laterality: Left;  IVA LOCAL Diabetic - oral meds   APPENDECTOMY     CATARACT EXTRACTION W/PHACO Right 06/23/2015   Procedure: CATARACT EXTRACTION PHACO AND INTRAOCULAR LENS PLACEMENT (IOC);  Surgeon: Estill Cotta, MD;  Location: [facility];  Service: Ophthalmology;  Laterality: Right;  [place] 01:28 AP% 23.4 CDE 36.14 fluid pack lot # [id number]   CATARACT EXTRACTION W/PHACO Left 07/28/2015   Procedure: CATARACT EXTRACTION PHACO AND INTRAOCULAR LENS PLACEMENT (IOC);  Surgeon: Estill Cotta, MD;  Location: [facility];  Service: Ophthalmology;  Laterality: Left;  [place]    1:29.7 AP%  24.9 CDE   41.32 fluid casette lot #[id number] H exp 09/23/2016   COONOSCOPY     AND ENDOSCOPY   DILATION AND CURETTAGE OF UTERUS     ENDARTERECTOMY Left 11/13/2020   Procedure: Evacuation of left neck hematoma;  Surgeon: Katha Cabal, MD;  Location: [facility];  Service: Vascular;  Laterality: Left;   ENDARTERECTOMY Left 11/13/2020   Procedure: ENDARTERECTOMY CAROTID;  Surgeon: Algernon Huxley, MD;  Location: [facility];  Service: Vascular;  Laterality: Left;   TONSILLECTOMY     TUBAL LIGATION     Patient Active Problem List   Diagnosis Date Noted   Carotid stenosis, symptomatic, with infarction ([place]) 11/13/2020   Gout flare 11/10/2020   UTI (urinary tract infection) 11/10/2020   Left carotid artery stenosis 11/10/2020   Chronic kidney disease 11/10/2020   Left basal ganglia embolic stroke ([place]) 80/99/8338   PAC (premature atrial contraction)    Pre-procedural cardiovascular examination    Ischemic stroke ([place]) 10/20/2020   TIA (transient ischemic attack) 10/19/2020   Right hemiparesis ([place]) 10/19/2020   Type 2 diabetes mellitus with stage 3 chronic kidney disease, without long-term current use of insulin ([place]) 08/24/2018   Hyperlipidemia, unspecified 07/19/2017   Hypertension 07/19/2017   PUD (peptic ulcer disease) 07/19/2017   Type 2 diabetes mellitus ([place]) 07/19/2017   Personal history of gout 08/12/2016   Anemia, unspecified 04/09/2016   Hypothyroidism, acquired 12/10/2015    REFERRING DIAG: CVA with right hemiparesis  THERAPY DIAG:  Muscle weakness (generalized)  Other lack of coordination  Apraxia  Difficulty in walking,  not elsewhere classified  Unsteadiness on feet  Other abnormalities of gait and mobility  Abnormality of gait and mobility  Rationale for Evaluation and Treatment Rehabilitation  PERTINENT HISTORY: 88 y.o. female with medical history significant for type 2 diabetes mellitus, essential hypertension, acquired hypothyroidism, hyperlipidemia, stage III chronic kidney disease reports increased right sided weakness on 10/19/20. She was diagnosed with left basal ganglia ischemic stroke. She was discharged to inpatient rehab for 10 days and then discharged home. Patient was  evaluated by outpatient PT on 11/12/20. CVA was due to carotid stenosis. She underwent left carotid endartectomy on 7/21. Procedure went well however patient suffered from left cervical hematoma and had subsequent surgical procedure to stop the bleed and remove the hematoma on 11/13/20. They were concerned with her breathing and therefore intubated her for 1 day, she was in ICU for 2 days and transferred to med/surge on 11/15/20. Patient was discharged home on 11/17/20 and is now returning to outpatient PT. She has had the stiches removed and is healing well. Denies any neck pain or stiffness. She lives with her daughter and has 24/7 caregiver support. She is still using RW for most ambulation. She is able to transfer to bedside commode well and is able to stand short time unsupported. She reports feeling very weak and fatigued. She reports she has been dragging her right foot more. She denies any numbness/tingling; She reports feeling like her legs are wanting to do more but is hesitant because of fear of falling. She is mod I for self care morning routine. She does still receive help with showering. She has been working on increasing her activity but denies any formal exercise.   PRECAUTIONS: Fall risk  SUBJECTIVE: Pt reports doing ok. She reports checking her BP over the weekend and its been better; No new symptoms; No new falls  PAIN:  Are you having pain? No   TODAY'S TREATMENT:  10/20/21 BP at start of session: 163/57, HR68   Standing on bottom step: Hip flexion march against green tband x12 reps each LE with BUE rail assist;   Seated hamstring curl x15 reps each LE, green tband;    Pt does exhibit increased weakness in RLE compared to LLE;  Gait around gym x80 feet with tripod base cane, CGA for safety and cues to increase step length for better gait safety;  BP after exercise: 170/55, HR69   NMR:    Standing on airex pad: -tapping foot to cone #3, x5 sets with 1 rail assist, each  LE -Progressed to multi-directional stepping with therapist calling out color of stepping stone x1 min each foot             Pt does require min A for safety. She exhibits better coordination in RLE this session with better accuracy tapping targets;    Standing on airex: -BLE heel raises 3 sec hold x10 reps; -alternate toe taps to 6 inch step unsupported x10 reps; -standing one foot on airex, one foot on 12 inch step (2nd step)  Unsupported standing 30 sec hold, x1 rep each with mod difficulty standing on RLE requiring min A for safety;   Instructed patient in Functional Gait Assessment, See below BP after FGA: 183/59  Pt educated throughout session about proper posture and technique with exercises. Improved exercise technique, movement at target joints, use of target muscles after min to mod verbal, visual, tactile cues.        PATIENT EDUCATION: Education details: Exercise technique/positioning;  Person educated:  Patient Education method: Explanation, Demonstration, and Verbal cues Education comprehension: verbalized understanding, returned demonstration, and needs further education   HOME EXERCISE PROGRAM: Continue as previously given;    PT Short Term Goals - 09/10/21 1354       PT SHORT TERM GOAL #1   Title Patient will be adherent to HEP at least 3x a week to improve functional strength and balance for better safety at home.    Baseline 9/21: Pt reports compliance with HEP and is confident, 1/3: doing exercise 2x a week; 06/25/21: 3x/week, 5/18: consistent;    Time 4    Period Weeks    Status Achieved    Target Date 06/25/21      PT SHORT TERM GOAL #2   Title Patient will be independent in transferring sit<>Stand without pushing on arm rests to improve ability to get up from chair.    Baseline 9/21: pt able to complete STS without use of UEs, 1/3:able to stand but has moderate difficulty requiring increased time; 06/25/21: able to perform indep without hands but requiring  use of momentum to attain standing. Remains stable. 5/18: able to stand without UE assist, close supervision, remains stable;    Time 4    Period Weeks    Status Achieved    Target Date 07/23/21              PT Long Term Goals - 09/10/21 1357       PT LONG TERM GOAL #1   Title Patient (> 41 years old) will complete five times sit to stand test in < 15 seconds indicating an increased LE strength and improved balance.    Baseline 9/21: 29 seconds hands-free 10/10: deferred 10/12: 34 sec hands free; 11/2: 23.8 sec hands-free; 12/7: 33.2 sec hands free, 1/3: 33.92 hands free; 06/25/21: 25.69 sec, 4/13: 15.5 sec hands free, 5/18: 23 sec without UE 6/22: deferred   Time 8    Period Weeks    Status Partially Met    Target Date 10/20/21      PT LONG TERM GOAL #2   Title Patient will increase six minute walk test distance to >400 feet for improve gait ability    Baseline 9/21: 368 ft with RW; 10/10: deferred 10/12: 408 ft; 11/2: 476 ft with 4WW, 1/3: 475 feet with RW; 4/13: 450 feet without AD CGA 10/15/21: 460 feet without AD, CGA   Time 8    Period Weeks    Status Achieved    Target Date 10/20/21      PT LONG TERM GOAL #3   Title Patient will increase 10 meter walk test to >1.50ms as to improve gait speed for better community ambulation and to reduce fall risk.    Baseline 9/21: 0.5 m/s with RW; 10/10 deferred 10/12: 0.93 m/s with 45QG 11/2: 0.83 m/s with 49EE 12/7: 0.52 m/s with RW, 1/3: 0.704; 06/25/21: 0.55 m/s with SPC. 5/18: 0.4 m/s without AD , 10/15/21 0.507 m/s without AD   Time 8    Period Weeks    Status Partially Met   Target Date 10/20/21      PT LONG TERM GOAL #4   Title Patient will be independent with ascend/descend 6 steps using single UE in step over step pattern without LOB.    Baseline 9/21:  Patient uses PT stairs (4 steps) with bilateral upper extremity support on handrails for both ascending/descending.  Patient exhibits reciprocal pattern ascending and step to  pattern descending. 10/10: deferred; 10/12: ascending/descending  recip. steps with BUE support; 11/2: Ascended/descended 8 steps total, reciprocal pattern ascending and step-to descending. Pt uses UUE support throughout with CGA assist.; 12/7: Ascended and descended 8 steps total with UUE support and CGA. Reciprocal stepping ascending and step to descending., 1/3: Deferrred; 06/25/21: able to perform with SUE support and reciprocal pattern asc/desc, but does require close CGA with descending. 5/18: independent with 2 handrails, close supervision with 1 handrai; 10/15/21: requires 2 handrails   Time 8    Period Weeks    Status Partially Met    Target Date 10/20/21      PT LONG TERM GOAL #5   Title Patient will demonstrate an improved Berg Balance Score of >45/56 as to demonstrate improved balance with ADLs such as sitting/standing and transfer balance and reduced fall risk.    Baseline 9/21: deferred d/t time; 9/29: 42/56; 10/10: deferred; 10/12: 44/56; 11/2: deferred; 11/7: 46/56, 1/3: deferred    Time 8    Period Weeks    Status Achieved    Target Date 10/20/21      PT LONG TERM GOAL #6   Title Patient will improve FOTO score to >60% to indicate improved functional mobility with ADLs.    Baseline 9/21: 59; 10/10: 57; 11/2: 60; 12/7: 58%, 1/3: 61%, 4/13: 56% 10/15/21: 58%   Time 8    Period Weeks    Status Partially Met   Target Date 10/20/21      PT LONG TERM GOAL #7   Title Pt will improve FGA by a minimum of 5 points to indicate clinically significant improvement in reduced risk of falls with walking tasks.    Baseline 06/25/21: 12, 4/13: deferred, 5/18: deferred due to increased ankle discomfort; 6/22: deferred due to elevated BP- will assess next visit;    Time 8    Period Weeks    Status New    Target Date 10/20/21              Plan -    Clinical Impression Statement Patient motivated and participated well within session. She was instructed in advanced LE strengthening,  utilizing resistance band for strengthening. She does require min VCS for correct exercise technique. Patient instructed in advanced balance exercise. Utilized compliant surface to challenge stance control. She was challenged with multi-directional stepping to various targets. Pt did require min A when standing on airex pad with target stepping. Patient requires intermittent rail assist throughout session and CGA to min A for safety. Monitored BP throughout session. She continues to have elevated BP,requiring short rest breaks throughout session. Patient does ambulate with slower gait speed. She would benefit from additional skilled PT intervention to improve strength, balance and mobility;    Personal Factors and Comorbidities Age    Comorbidities HTN, CKD, fall risk, type 2 diabetes,    Examination-Activity Limitations Lift;Locomotion Level;Squat;Stairs;Stand;Toileting;Transfers;Bathing    Examination-Participation Restrictions Cleaning;Community Activity;Laundry;Meal Prep;Shop;Volunteer;Driving    Stability/Clinical Decision Making Stable/Uncomplicated    Rehab Potential Fair    PT Frequency 2x / week    PT Duration 8 weeks    PT Treatment/Interventions Cryotherapy;Electrical Stimulation;Moist Heat;Gait training;Stair training;Functional mobility training;Therapeutic activities;Therapeutic exercise;Neuromuscular re-education;Balance training;Patient/family education;Orthotic Fit/Training;Energy conservation    PT Next Visit Plan work on LE strengthening and balance; gait training, endurance, continue POC as previously indicated    PT Home Exercise Plan Access Code: [email or web address]; no updates    Consulted and Agree with Plan of Care Patient  Alyssa Rotondo, PT, DPT 10/21/2021, 8:40 AM

## 2021-10-22 ENCOUNTER — Ambulatory Visit: Payer: Medicare HMO | Admitting: Occupational Therapy

## 2021-10-22 ENCOUNTER — Ambulatory Visit: Payer: Medicare HMO | Admitting: Physical Therapy

## 2021-10-22 ENCOUNTER — Encounter: Payer: Self-pay | Admitting: Physical Therapy

## 2021-10-22 DIAGNOSIS — M6281 Muscle weakness (generalized): Secondary | ICD-10-CM

## 2021-10-22 DIAGNOSIS — R262 Difficulty in walking, not elsewhere classified: Secondary | ICD-10-CM

## 2021-10-22 DIAGNOSIS — R278 Other lack of coordination: Secondary | ICD-10-CM

## 2021-10-22 DIAGNOSIS — R482 Apraxia: Secondary | ICD-10-CM

## 2021-10-22 DIAGNOSIS — R2689 Other abnormalities of gait and mobility: Secondary | ICD-10-CM

## 2021-10-22 DIAGNOSIS — R2681 Unsteadiness on feet: Secondary | ICD-10-CM

## 2021-10-22 DIAGNOSIS — R269 Unspecified abnormalities of gait and mobility: Secondary | ICD-10-CM

## 2021-10-22 NOTE — Therapy (Signed)
OUTPATIENT OCCUPATIONAL THERAPY TREATMENT NOTE   Patient Name: Cassandra Schaefer MRN: [id number] DOB:10/04/1928, 88 y.o., female Today's Date: 10/22/2021   REFERRING PROVIDER: Tracie Harrier, MD   OT End of Session - 10/22/21 1305     Visit Number 81    Number of Visits 114    Date for OT Re-Evaluation 12/28/21    Authorization Time Period Reporting period starting 09/03/2021    OT Start Time 1300    OT Stop Time 1345    OT Time Calculation (min) 45 min    Equipment Utilized During Treatment tranport chair    Activity Tolerance Patient tolerated treatment well    Behavior During Therapy WFL for tasks assessed/performed             Past Medical History:  Diagnosis Date   Anemia    Cough    RESOLVING, NO FEVER   Diabetes mellitus without complication ([place])    Gout    Hypertension    Hypothyroidism    PUD (peptic ulcer disease)    Stroke [facility])    Wears dentures    full upper, partial lower   Past Surgical History:  Procedure Laterality Date   AMPUTATION TOE Left 12/14/2017   Procedure: AMPUTATION TOE-4TH MPJ;  Surgeon: Samara Deist, DPM;  Location: [place];  Service: Podiatry;  Laterality: Left;  IVA LOCAL Diabetic - oral meds   APPENDECTOMY     CATARACT EXTRACTION W/PHACO Right 06/23/2015   Procedure: CATARACT EXTRACTION PHACO AND INTRAOCULAR LENS PLACEMENT (IOC);  Surgeon: Estill Cotta, MD;  Location: [facility];  Service: Ophthalmology;  Laterality: Right;  [place] 01:28 AP% 23.4 CDE 36.14 fluid pack lot # [id number]   CATARACT EXTRACTION W/PHACO Left 07/28/2015   Procedure: CATARACT EXTRACTION PHACO AND INTRAOCULAR LENS PLACEMENT (IOC);  Surgeon: Estill Cotta, MD;  Location: [facility];  Service: Ophthalmology;  Laterality: Left;  [place]    1:29.7 AP%  24.9 CDE   41.32 fluid casette lot #[id number] H exp 09/23/2016   COONOSCOPY     AND ENDOSCOPY   DILATION AND CURETTAGE OF UTERUS     ENDARTERECTOMY Left 11/13/2020   Procedure: Evacuation of left neck  hematoma;  Surgeon: Katha Cabal, MD;  Location: [facility];  Service: Vascular;  Laterality: Left;   ENDARTERECTOMY Left 11/13/2020   Procedure: ENDARTERECTOMY CAROTID;  Surgeon: Algernon Huxley, MD;  Location: [facility];  Service: Vascular;  Laterality: Left;   TONSILLECTOMY     TUBAL LIGATION     Patient Active Problem List   Diagnosis Date Noted   Carotid stenosis, symptomatic, with infarction ([place]) 11/13/2020   Gout flare 11/10/2020   UTI (urinary tract infection) 11/10/2020   Left carotid artery stenosis 11/10/2020   Chronic kidney disease 11/10/2020   Left basal ganglia embolic stroke ([place]) 56/21/3086   PAC (premature atrial contraction)    Pre-procedural cardiovascular examination    Ischemic stroke ([place]) 10/20/2020   TIA (transient ischemic attack) 10/19/2020   Right hemiparesis ([place]) 10/19/2020   Type 2 diabetes mellitus with stage 3 chronic kidney disease, without long-term current use of insulin ([place]) 08/24/2018   Hyperlipidemia, unspecified 07/19/2017   Hypertension 07/19/2017   PUD (peptic ulcer disease) 07/19/2017   Type 2 diabetes mellitus ([place]) 07/19/2017   Personal history of gout 08/12/2016   Anemia, unspecified 04/09/2016   Hypothyroidism, acquired 12/10/2015   REFERRING DIAG: CVA  THERAPY DIAG:  Muscle weakness (generalized)  Rationale for Evaluation and Treatment Rehabilitation  PERTINENT HISTORY:   R ankle pain, gout,  T2DM, HTN, CKDIII; R/L carotid endarectomy  PRECAUTIONS: None  SUBJECTIVE: Pt. Reports doing well today  PAIN:  Are you having pain? No     OBJECTIVE:   TODAY'S TREATMENT:   Pt. worked on right hand [facility] skills grasping holding, shaking, and rolling dice from her hand. Pt. worked on right hand motor control and [facility] skills sorting buttons by size using isolated 2nd digit extension.  Pt. worked on grasping the buttons from the flat tabletop surface, and storing them in the palm of her hand. Pt. Worked on dropping the buttons one at  a time form the palm of her hand.  Pt. continues to present with impaired motor control, and [facility] skill skills. Pt. presented with more difficulty with grasping progressively smaller buttons. Pt. dropped several smaller slick buttons when attempting to pick them up from the flat tabletop surface.  Pt. required rest breaks with proprioceptive input through a flat hand.  Pt. Presented with occasional over reaching when reaching with the RUE to place the buttons into the container.   PATIENT EDUCATION: Education details: Right hand motor control, and coordination skills. Person educated: Patient Education method: Explanation, Demonstration, Tactile cues, and Handouts Education comprehension: verbalized understanding, returned demonstration, verbal cues required, and needs further education   HOME EXERCISE PROGRAM  Pt. To Continue with current HEPs with ongoing updates as indicated   OT Short Term Goals - 03/23/21 1657       OT SHORT TERM GOAL #1   Title Pt will perform HEP for RUE strength/coordination independently.    Baseline Eval: HEP not yet established in outpatient, but pt is working with putty from [name]; 12/04/2020; [facility] HEP initiated this day with mod vc after return demo; 01/05/2021: indep with currently program, but ongoing as pt progresses; 01/28/2021: vc to revisit and focus on grip and pinch strengthening with putty; 03/23/21: pt is indep with HEP    Time 6    Period Weeks    Status Achieved    Target Date 03/23/21              OT Long Term Goals - 10/15/21 1322       OT LONG TERM GOAL #1   Title Pt will improve hand writing to 100% legibility with R hand to be able to independently write a check.    Baseline Eval: signature is 75% legible with R hand, requiring extra time; 12/04/2020: no chan to complete.ge from eval; 01/05/2021: Printing is 100% legible, signature is 90% legible, but still requires extra time and effort.; 01/26/21: Signature is 90% legible and speed/fluidity  have improved. 20th visit: 90% legible, 03/23/21: 90% legible, extra time 40th visit 90 % legibility with increased time, 50th visit: 90% legible with with increased time required 07/14/2021: 90% legibility. 07/30/2021: 90% legibility, pt. presses too hard through the pen, and pencil. 09/03/2021: 90% legibility; 10/06/21: Increased time, but pt states she signs cards and writes short notes on a card sufficiently.  Pt states she has no need to write checks anymore as her family assists with this and she has no desire to return to this, but she would like to work on her pressure modulation.  New goal below. 10/15/2021: Pt. conitnues to require increased time to complete note cards, and corespondence.    Time 12    Period Weeks    Status Partially Met    Target Date 12/28/21      OT LONG TERM GOAL #2   Title Pt will improve R hand  coordination to enable good manipulation of iphone using R dominant hand      OT LONG TERM GOAL #3   Title Pt will improve GMC throughout RUE to enable pt to reach for and pick up ADL supplies with R dominant hand without dropping or knocking over objects.    Baseline Eval: Pt reports RUE is clumsy and easily knocks over objects when trying to reach for ADL supplies.  Pt verbalizes that her "aim is off."; 12/04/2020: pt reports slight improvement since eval and has started using her R hand to eat, but still feels clumsy; 01/05/2021: Greatly improved; pt is using silverware to eat with R hand, can comb hair with RUE, still mildly ataxic; 01/26/21: pt reports difficulty picking up objects within a small space (ie; may knock the toothpaste holder down when reaching for toothbrush), but reaching for light/larger objects is improving 20th visit: pt. continues to be mildly ataxic, however is consistently improving with motor control, and [place] while reaching targets; 03/23/21: Pt reaches with accuracy towards targets if she takes her time (pt drops and knocks things over when moving at a  faster/normal pace) 04/23/2021: Pt. continues to present with difficulty with depth perception, and impaired [place].40th visit: Pt. is improving, however tends to knock over lighter weight objects.50th visit: Pt. has improved with reaching for items without knocking them over, however continues to need work on accuracy.07/14/2021: Pt. is improving while motor control, and accuracy of reaching for objects. Pt. continues to work towards improving efficiency with reaching for objects. 07/30/2021: Pt. is able to able to reach for objects more accurately, however continues to present with limited motor control in the Right. 09/03/2021: Pt. Pt. is improving with reaching up without knocking items over. pt. is now able to stand at the sink and reach up without dropping items; 10/06/21: pt estimates she reaches with 85-90% accurate with RUE, occasional dropping. 10/15/2021: Pt. reports 85-90% accureacy, however presents with decreased accuracy the higher, or lower she reaches.    Time 12    Period Weeks    Status On-going    Target Date 12/28/21      OT LONG TERM GOAL #4   Title Pt will improve FOTO score to 68 or better to indicate measurable functional improvement.    Baseline Eval: FOTO 55; 01/05/2021: FOTO 61; 01/28/21: FOTO 61, 20th visit: 61; 03/23/21: FOTO 60 4oth visit: FOTO score: 61, 50th visit: FOTO 69 07/30/2021: FOTO 71  09/03/2021: FOTO 73; 10/06/21: 73 10/15/2021: 77    Time 12    Period Weeks    Status On-going      OT LONG TERM GOAL #6   Title Pt. will improve right hand [facility] skills to be able to indepedendently manipulate and set-up her medication/pills in the pillbox.    Baseline 07/14/2021: Pt. has difficulty manipulating small pills, and often drops them. 07/30/2021: Pt. continues to have difficulty grasping pills/medication for set-up of pillbox. 09/03/2021: Pt. is able to grasp pills from the pillbox without dropping them; 10/06/21: modified indep (with extra time to complete). 10/15/2021: Pt. reports  requiring increased time to complete.    Time 12    Period Weeks    Status Partially Met    Target Date 12/28/21      OT LONG TERM GOAL #7   Title Pt will improve accuracy with graded force through RUE during functional tasks per self report.    Baseline 10/06/21: Pt reports she holds her greatgrandchild too tightly, scratches her arm too forcefully,  and sometimes writes with too much pressure; pt struggles to grade force for functional activities.10/15/2021: Pt. continues to grasp items with the left hand too tightly.    Time 12    Period Weeks    Status Achieved              Plan - 10/22/21 1311     Clinical Impression Statement Pt. continues to present with impaired motor control, and [facility] skill skills. Pt. presented with more difficulty with grasping progressively smaller buttons. Pt. dropped several smaller slick buttons when attempting to pick them up from the flat tabletop surface.  Pt. required rest breaks with proprioceptive input through a flat hand.  Pt. Presented with occasional over reaching when reaching with the RUE to place the buttons into the container.    OT Occupational Profile and History Detailed Assessment- Review of Records and additional review of physical, cognitive, psychosocial history related to current functional performance    Occupational performance deficits (Please refer to evaluation for details): ADL's;Leisure;IADL's    Body Structure / Function / Physical Skills ADL;Coordination;Endurance;GMC;UE functional use;Balance;Sensation;Body mechanics;IADL;Pain;Dexterity;FMC;Strength;Gait;Mobility    Rehab Potential Good    Clinical Decision Making Several treatment options, min-mod task modification necessary    Comorbidities Affecting Occupational Performance: May have comorbidities impacting occupational performance    Modification or Assistance to Complete Evaluation  Min-Moderate modification of tasks or assist with assess necessary to complete eval    OT  Frequency 2x / week    OT Duration 12 weeks    OT Treatment/Interventions Self-care/ADL training;Therapeutic exercise;DME and/or AE instruction;Functional Mobility Training;Balance training;Neuromuscular education;Therapeutic activities;Patient/family education    Family Member Consulted daughter, Emi Holes [place], MS, OTR/L  Harrel Carina, OT 10/22/2021, 1:16 PM

## 2021-10-22 NOTE — Therapy (Addendum)
OUTPATIENT PHYSICAL THERAPY TREATMENT NOTE    Patient Name: Cassandra Schaefer MRN: [id number] DOB:[birth year], 88 y.o., female Today's Date: 10/22/2021  PCP: Roetta Sessions MD REFERRING PROVIDER: Roetta Sessions MD   PT End of Session - 10/22/21 1353     Visit Number 82    Number of Visits 102    Date for PT Re-Evaluation 12/17/21    Authorization Type [employer]    PT Start Time 1350    PT Stop Time 1430    PT Time Calculation (min) 40 min    Equipment Utilized During Treatment Gait belt    Activity Tolerance Patient tolerated treatment well    Behavior During Therapy WFL for tasks assessed/performed             Past Medical History:  Diagnosis Date   Anemia    Cough    RESOLVING, NO FEVER   Diabetes mellitus without complication ([place])    Gout    Hypertension    Hypothyroidism    PUD (peptic ulcer disease)    Stroke [facility])    Wears dentures    full upper, partial lower   Past Surgical History:  Procedure Laterality Date   AMPUTATION TOE Left 12/14/2017   Procedure: AMPUTATION TOE-4TH MPJ;  Surgeon: Samara Deist, DPM;  Location: [place];  Service: Podiatry;  Laterality: Left;  IVA LOCAL Diabetic - oral meds   APPENDECTOMY     CATARACT EXTRACTION W/PHACO Right 06/23/2015   Procedure: CATARACT EXTRACTION PHACO AND INTRAOCULAR LENS PLACEMENT (IOC);  Surgeon: Estill Cotta, MD;  Location: [facility];  Service: Ophthalmology;  Laterality: Right;  [place] 01:28 AP% 23.4 CDE 36.14 fluid pack lot # [id number]   CATARACT EXTRACTION W/PHACO Left 07/28/2015   Procedure: CATARACT EXTRACTION PHACO AND INTRAOCULAR LENS PLACEMENT (IOC);  Surgeon: Estill Cotta, MD;  Location: [facility];  Service: Ophthalmology;  Laterality: Left;  [place]    1:29.7 AP%  24.9 CDE   41.32 fluid casette lot #[id number] H exp 09/23/2016   COONOSCOPY     AND ENDOSCOPY   DILATION AND CURETTAGE OF UTERUS     ENDARTERECTOMY Left 11/13/2020   Procedure: Evacuation of left neck hematoma;  Surgeon: Katha Cabal, MD;  Location: [facility];  Service: Vascular;  Laterality: Left;   ENDARTERECTOMY Left 11/13/2020   Procedure: ENDARTERECTOMY CAROTID;  Surgeon: Algernon Huxley, MD;  Location: [facility];  Service: Vascular;  Laterality: Left;   TONSILLECTOMY     TUBAL LIGATION     Patient Active Problem List   Diagnosis Date Noted   Carotid stenosis, symptomatic, with infarction ([place]) 11/13/2020   Gout flare 11/10/2020   UTI (urinary tract infection) 11/10/2020   Left carotid artery stenosis 11/10/2020   Chronic kidney disease 11/10/2020   Left basal ganglia embolic stroke ([place]) 56/21/3086   PAC (premature atrial contraction)    Pre-procedural cardiovascular examination    Ischemic stroke ([place]) 10/20/2020   TIA (transient ischemic attack) 10/19/2020   Right hemiparesis ([place]) 10/19/2020   Type 2 diabetes mellitus with stage 3 chronic kidney disease, without long-term current use of insulin ([place]) 08/24/2018   Hyperlipidemia, unspecified 07/19/2017   Hypertension 07/19/2017   PUD (peptic ulcer disease) 07/19/2017   Type 2 diabetes mellitus ([place]) 07/19/2017   Personal history of gout 08/12/2016   Anemia, unspecified 04/09/2016   Hypothyroidism, acquired 12/10/2015    REFERRING DIAG: CVA with right hemiparesis  THERAPY DIAG:  Muscle weakness (generalized)  Other lack of coordination  Apraxia  Difficulty in walking,  not elsewhere classified  Unsteadiness on feet  Other abnormalities of gait and mobility  Abnormality of gait and mobility  Rationale for Evaluation and Treatment Rehabilitation  PERTINENT HISTORY: 88 y.o. female with medical history significant for type 2 diabetes mellitus, essential hypertension, acquired hypothyroidism, hyperlipidemia, stage III chronic kidney disease reports increased right sided weakness on 10/19/20. She was diagnosed with left basal ganglia ischemic stroke. She was discharged to inpatient rehab for 10 days and then discharged home. Patient was  evaluated by outpatient PT on 11/12/20. CVA was due to carotid stenosis. She underwent left carotid endartectomy on 7/21. Procedure went well however patient suffered from left cervical hematoma and had subsequent surgical procedure to stop the bleed and remove the hematoma on 11/13/20. They were concerned with her breathing and therefore intubated her for 1 day, she was in ICU for 2 days and transferred to med/surge on 11/15/20. Patient was discharged home on 11/17/20 and is now returning to outpatient PT. She has had the stiches removed and is healing well. Denies any neck pain or stiffness. She lives with her daughter and has 24/7 caregiver support. She is still using RW for most ambulation. She is able to transfer to bedside commode well and is able to stand short time unsupported. She reports feeling very weak and fatigued. She reports she has been dragging her right foot more. She denies any numbness/tingling; She reports feeling like her legs are wanting to do more but is hesitant because of fear of falling. She is mod I for self care morning routine. She does still receive help with showering. She has been working on increasing her activity but denies any formal exercise.   PRECAUTIONS: Fall risk  SUBJECTIVE: Pt reports doing ok. She reports finishing planting her flowers. She reports walking the driveway with her walker and washing things off. She reports feeling good doing that. No new falls. No pain today;   PAIN:  Are you having pain? No   TODAY'S TREATMENT:   BP at start of session: 158/57, HR66   Standing on bottom step: Hip flexion march against green tband x12 reps each LE with BUE rail assist;   Seated hamstring curl x15 reps each LE, green tband;    Pt does exhibit increased weakness in RLE compared to LLE;  Gait around gym x80 feet with tripod base cane, Tried with hurry cane to see if handle would be more comfortable, CGA with less discomfort in hand Gait around gym x50 feet  with SPC with wide base handle- pt felt too unsteady with poor UE coordination requiring min A for safety.   Pt reports feeling more comfortable with tripod base cane with improved stability;   Educated patient in safe exercises to do while in pool for increased ROM/strengthening, see below;    NMR:   Standing on 1/2 bolster (round side up) -Heel raises x15 reps -Toe raises x15 reps    Standing on airex: -alternate toe taps to 6 inch step unsupported x12 reps; -standing one foot on airex, one foot on 12 inch step (2nd step)  Unsupported standing 30 sec hold, x1 rep each with mod difficulty standing on RLE requiring min A for safety;   Progressed to head turns side/side x5 reps each foot on step; Patient does exhibit increased challenge with RLE stance with decreased weight shift and impaired stance control. She required min A for safety;    Pt educated throughout session about proper posture and technique with exercises. Improved exercise technique,  movement at target joints, use of target muscles after min to mod verbal, visual, tactile cues.        PATIENT EDUCATION: Education details: Exercise technique/positioning; HEP Person educated: Patient Education method: Consulting civil engineer, Demonstration, and Verbal cues Education comprehension: verbalized understanding, returned demonstration, and needs further education   HOME EXERCISE PROGRAM: Instructed patient in pool exercise: Access Code: [email or web address] URL: [email or web address] Date: 10/22/2021 Prepared by: Blanche East  Exercises - Seated Knee Extension with Resistance  - 1 x daily - 7 x weekly - 2 sets - 15 reps - Forward and Backward Stepping at [employer]  - 1 x daily - 7 x weekly - 1 sets - 5 reps - Freestyle Swimming with Snorkel Mask  - 1 x daily - 7 x weekly - 1 sets - 2-3 reps - Heel Walking  - 1 x daily - 7 x weekly - 1 sets - 5 reps - Toe Walking  - 1 x daily - 7 x weekly - 1 sets - 5 reps - Single Leg  Stance at Pool Wall  - 1 x daily - 7 x weekly - 1 sets - 3-5 reps - 15-30 sec hold   PT Short Term Goals -       PT SHORT TERM GOAL #1   Title Patient will be adherent to HEP at least 3x a week to improve functional strength and balance for better safety at home.    Baseline 9/21: Pt reports compliance with HEP and is confident, 1/3: doing exercise 2x a week; 06/25/21: 3x/week, 5/18: consistent;    Time 4    Period Weeks    Status Achieved    Target Date 06/25/21      PT SHORT TERM GOAL #2   Title Patient will be independent in transferring sit<>Stand without pushing on arm rests to improve ability to get up from chair.    Baseline 9/21: pt able to complete STS without use of UEs, 1/3:able to stand but has moderate difficulty requiring increased time; 06/25/21: able to perform indep without hands but requiring use of momentum to attain standing. Remains stable. 5/18: able to stand without UE assist, close supervision, remains stable;    Time 4    Period Weeks    Status Achieved    Target Date 07/23/21              PT Long Term Goals -      PT LONG TERM GOAL #1   Title Patient (> 55 years old) will complete five times sit to stand test in < 15 seconds indicating an increased LE strength and improved balance.    Baseline 9/21: 29 seconds hands-free 10/10: deferred 10/12: 34 sec hands free; 11/2: 23.8 sec hands-free; 12/7: 33.2 sec hands free, 1/3: 33.92 hands free; 06/25/21: 25.69 sec, 4/13: 15.5 sec hands free, 5/18: 23 sec without UE 6/22: deferred   Time 8    Period Weeks    Status Partially Met    Target Date  12/17/21     PT LONG TERM GOAL #2   Title Patient will increase six minute walk test distance to >400 feet for improve gait ability    Baseline 9/21: 368 ft with RW; 10/10: deferred 10/12: 408 ft; 11/2: 476 ft with 4WW, 1/3: 475 feet with RW; 4/13: 450 feet without AD CGA 10/15/21: 460 feet without AD, CGA   Time 8    Period Weeks    Status Achieved    Target Date  12/17/21  PT LONG TERM GOAL #3   Title Patient will increase 10 meter walk test to >1.7ms as to improve gait speed for better community ambulation and to reduce fall risk.    Baseline 9/21: 0.5 m/s with RW; 10/10 deferred 10/12: 0.93 m/s with 43YQ 11/2: 0.83 m/s with 46VH 12/7: 0.52 m/s with RW, 1/3: 0.704; 06/25/21: 0.55 m/s with SPC. 5/18: 0.4 m/s without AD , 10/15/21 0.507 m/s without AD   Time 8    Period Weeks    Status Partially Met   Target Date 12/17/21     PT LONG TERM GOAL #4   Title Patient will be independent with ascend/descend 6 steps using single UE in step over step pattern without LOB.    Baseline 9/21:  Patient uses PT stairs (4 steps) with bilateral upper extremity support on handrails for both ascending/descending.  Patient exhibits reciprocal pattern ascending and step to pattern descending. 10/10: deferred; 10/12: ascending/descending recip. steps with BUE support; 11/2: Ascended/descended 8 steps total, reciprocal pattern ascending and step-to descending. Pt uses UUE support throughout with CGA assist.; 12/7: Ascended and descended 8 steps total with UUE support and CGA. Reciprocal stepping ascending and step to descending., 1/3: Deferrred; 06/25/21: able to perform with SUE support and reciprocal pattern asc/desc, but does require close CGA with descending. 5/18: independent with 2 handrails, close supervision with 1 handrai; 10/15/21: requires 2 handrails   Time 8    Period Weeks    Status Partially Met    Target Date 12/17/21     PT LONG TERM GOAL #5   Title Patient will demonstrate an improved Berg Balance Score of >45/56 as to demonstrate improved balance with ADLs such as sitting/standing and transfer balance and reduced fall risk.    Baseline 9/21: deferred d/t time; 9/29: 42/56; 10/10: deferred; 10/12: 44/56; 11/2: deferred; 11/7: 46/56, 1/3: deferred    Time 8    Period Weeks    Status Achieved    Target Date 12/17/21     PT LONG TERM GOAL #6   Title Patient  will improve FOTO score to >60% to indicate improved functional mobility with ADLs.    Baseline 9/21: 59; 10/10: 57; 11/2: 60; 12/7: 58%, 1/3: 61%, 4/13: 56% 10/15/21: 58%   Time 8    Period Weeks    Status Partially Met   Target Date 12/17/21     PT LONG TERM GOAL #7   Title Pt will improve FGA by a minimum of 5 points to indicate clinically significant improvement in reduced risk of falls with walking tasks.    Baseline 06/25/21: 12, 4/13: deferred, 5/18: deferred due to increased ankle discomfort; 6/22: deferred due to elevated BP- will assess next visit; 10/20/21: 8/30   Time 8    Period Weeks    Status Not met   Target Date 12/17/21             Plan -    Clinical Impression Statement Patient motivated and participated well within session. She was instructed in advanced LE strengthening, utilizing resistance band for strengthening. She does require min VCS for correct exercise technique. Pt educated in proper cane for gait safety. She exhibits preference for tripod base cane. Recommend patient get padding on current cane for less discomfort to hand. Patient instructed in advanced balance exercise. Utilized compliant surface to challenge stance control. She does have difficulty with SLS requiring min A for weight shift/stance control. Patient requires intermittent rail assist throughout session and CGA to min A  for safety.  Patient does have a pool at home and expressed interest in getting HEP for pool exercises. See patient instructions above; She would benefit from additional skilled PT intervention to improve strength, balance and mobility; Goals were last updated on 10/15/21, She is making progress but is still experiencing deconditioning, weakness and imbalance;    Personal Factors and Comorbidities Age    Comorbidities HTN, CKD, fall risk, type 2 diabetes,    Examination-Activity Limitations Lift;Locomotion Level;Squat;Stairs;Stand;Toileting;Transfers;Bathing     Examination-Participation Restrictions Cleaning;Community Activity;Laundry;Meal Prep;Shop;Volunteer;Driving    Stability/Clinical Decision Making Stable/Uncomplicated    Rehab Potential Fair    PT Frequency 2x / week    PT Duration 8 weeks    PT Treatment/Interventions Cryotherapy;Electrical Stimulation;Moist Heat;Gait training;Stair training;Functional mobility training;Therapeutic activities;Therapeutic exercise;Neuromuscular re-education;Balance training;Patient/family education;Orthotic Fit/Training;Energy conservation    PT Next Visit Plan work on LE strengthening and balance; gait training, endurance, continue POC as previously indicated    PT Home Exercise Plan    Consulted and Agree with Plan of Care Patient                 Gadge Hermiz, PT, DPT 10/22/2021, 2:52 PM

## 2021-10-29 ENCOUNTER — Encounter: Payer: Self-pay | Admitting: Physical Therapy

## 2021-10-29 ENCOUNTER — Ambulatory Visit: Payer: Medicare HMO | Admitting: Physical Therapy

## 2021-10-29 ENCOUNTER — Ambulatory Visit: Payer: Medicare HMO | Attending: Vascular Surgery | Admitting: Occupational Therapy

## 2021-10-29 DIAGNOSIS — R278 Other lack of coordination: Secondary | ICD-10-CM

## 2021-10-29 DIAGNOSIS — R262 Difficulty in walking, not elsewhere classified: Secondary | ICD-10-CM

## 2021-10-29 DIAGNOSIS — M6281 Muscle weakness (generalized): Secondary | ICD-10-CM | POA: Diagnosis present

## 2021-10-29 DIAGNOSIS — R482 Apraxia: Secondary | ICD-10-CM

## 2021-10-29 DIAGNOSIS — R2681 Unsteadiness on feet: Secondary | ICD-10-CM

## 2021-10-29 DIAGNOSIS — R269 Unspecified abnormalities of gait and mobility: Secondary | ICD-10-CM | POA: Diagnosis present

## 2021-10-29 DIAGNOSIS — R2689 Other abnormalities of gait and mobility: Secondary | ICD-10-CM | POA: Diagnosis present

## 2021-10-29 NOTE — Therapy (Signed)
OUTPATIENT PHYSICAL THERAPY TREATMENT NOTE    Patient Name: Cassandra Schaefer MRN: [id number] DOB:01/18/1929, 88 y.o., female Today's Date: 10/30/2021  PCP: Roetta Sessions MD REFERRING PROVIDER: Roetta Sessions MD   PT End of Session - 10/29/21 1312     Visit Number 64    Number of Visits 102    Date for PT Re-Evaluation 12/17/21    Authorization Type [employer]    PT Start Time 4098    PT Stop Time 1430    PT Time Calculation (min) 42 min    Equipment Utilized During Treatment Gait belt    Activity Tolerance Patient tolerated treatment well    Behavior During Therapy WFL for tasks assessed/performed             Past Medical History:  Diagnosis Date   Anemia    Cough    RESOLVING, NO FEVER   Diabetes mellitus without complication ([place])    Gout    Hypertension    Hypothyroidism    PUD (peptic ulcer disease)    Stroke [facility])    Wears dentures    full upper, partial lower   Past Surgical History:  Procedure Laterality Date   AMPUTATION TOE Left 12/14/2017   Procedure: AMPUTATION TOE-4TH MPJ;  Surgeon: Samara Deist, DPM;  Location: [place];  Service: Podiatry;  Laterality: Left;  IVA LOCAL Diabetic - oral meds   APPENDECTOMY     CATARACT EXTRACTION W/PHACO Right 06/23/2015   Procedure: CATARACT EXTRACTION PHACO AND INTRAOCULAR LENS PLACEMENT (IOC);  Surgeon: Estill Cotta, MD;  Location: [facility];  Service: Ophthalmology;  Laterality: Right;  [place] 01:28 AP% 23.4 CDE 36.14 fluid pack lot # [id number]   CATARACT EXTRACTION W/PHACO Left 07/28/2015   Procedure: CATARACT EXTRACTION PHACO AND INTRAOCULAR LENS PLACEMENT (IOC);  Surgeon: Estill Cotta, MD;  Location: [facility];  Service: Ophthalmology;  Laterality: Left;  [place]    1:29.7 AP%  24.9 CDE   41.32 fluid casette lot #[id number] H exp 09/23/2016   COONOSCOPY     AND ENDOSCOPY   DILATION AND CURETTAGE OF UTERUS     ENDARTERECTOMY Left 11/13/2020   Procedure: Evacuation of left neck hematoma;  Surgeon: Katha Cabal, MD;  Location: [facility];  Service: Vascular;  Laterality: Left;   ENDARTERECTOMY Left 11/13/2020   Procedure: ENDARTERECTOMY CAROTID;  Surgeon: Algernon Huxley, MD;  Location: [facility];  Service: Vascular;  Laterality: Left;   TONSILLECTOMY     TUBAL LIGATION     Patient Active Problem List   Diagnosis Date Noted   Carotid stenosis, symptomatic, with infarction ([place]) 11/13/2020   Gout flare 11/10/2020   UTI (urinary tract infection) 11/10/2020   Left carotid artery stenosis 11/10/2020   Chronic kidney disease 11/10/2020   Left basal ganglia embolic stroke ([place]) 30/86/5784   PAC (premature atrial contraction)    Pre-procedural cardiovascular examination    Ischemic stroke ([place]) 10/20/2020   TIA (transient ischemic attack) 10/19/2020   Right hemiparesis ([place]) 10/19/2020   Type 2 diabetes mellitus with stage 3 chronic kidney disease, without long-term current use of insulin ([place]) 08/24/2018   Hyperlipidemia, unspecified 07/19/2017   Hypertension 07/19/2017   PUD (peptic ulcer disease) 07/19/2017   Type 2 diabetes mellitus ([place]) 07/19/2017   Personal history of gout 08/12/2016   Anemia, unspecified 04/09/2016   Hypothyroidism, acquired 12/10/2015    REFERRING DIAG: CVA with right hemiparesis  THERAPY DIAG:  Other lack of coordination  Muscle weakness (generalized)  Apraxia  Difficulty in walking,  not elsewhere classified  Unsteadiness on feet  Other abnormalities of gait and mobility  Abnormality of gait and mobility  Rationale for Evaluation and Treatment Rehabilitation  PERTINENT HISTORY: 88 y.o. female with medical history significant for type 2 diabetes mellitus, essential hypertension, acquired hypothyroidism, hyperlipidemia, stage III chronic kidney disease reports increased right sided weakness on 10/19/20. She was diagnosed with left basal ganglia ischemic stroke. She was discharged to inpatient rehab for 10 days and then discharged home. Patient was  evaluated by outpatient PT on 11/12/20. CVA was due to carotid stenosis. She underwent left carotid endartectomy on 7/21. Procedure went well however patient suffered from left cervical hematoma and had subsequent surgical procedure to stop the bleed and remove the hematoma on 11/13/20. They were concerned with her breathing and therefore intubated her for 1 day, she was in ICU for 2 days and transferred to med/surge on 11/15/20. Patient was discharged home on 11/17/20 and is now returning to outpatient PT. She has had the stiches removed and is healing well. Denies any neck pain or stiffness. She lives with her daughter and has 24/7 caregiver support. She is still using RW for most ambulation. She is able to transfer to bedside commode well and is able to stand short time unsupported. She reports feeling very weak and fatigued. She reports she has been dragging her right foot more. She denies any numbness/tingling; She reports feeling like her legs are wanting to do more but is hesitant because of fear of falling. She is mod I for self care morning routine. She does still receive help with showering. She has been working on increasing her activity but denies any formal exercise.   PRECAUTIONS: Fall risk  SUBJECTIVE: Pt reports doing ok. She reports having a good 4th of July. She wasn't able to do pool exercises, but she did spend time outside. No new falls. She reports walking is going about the same. She reports being able to go grocery shopping and did pretty well- she did push the cart. BP has been okay.   PAIN:  Are you having pain? No   TODAY'S TREATMENT:   BP at start of session: 168/62, HR62   Standing on bottom step: Hip flexion march against green tband x15 reps each LE with BUE rail assist;  Gait around gym x180 feet with tripod base cane, CGA with reciprocal gait pattern, intermittent RLE foot drag with better cadence/gait speed. Pt does required occasional min A with occasional wobble but  overall exhibits improved gait ability;  BP after exercise/gait: 175/54, HR: 64   Leg press: BLE 40# x10 BLE 55#  2 x 10 reps Tolerated well, reports moderate difficulty but no pain. Able to tolerate increased resistance compared to previous sessions;     Pt does exhibit increased weakness in RLE compared to LLE;  Gait around gym x180 feet x2 laps with tripod base cane, Required CGA to min A for safety. Pt exhibits less right foot drag and better reciprocal gait pattern; Does ambulate with slower gait speed;    NMR:   Standing on 1/2 bolster (round side up) -Heel raises x15 reps -Toe raises x15 reps   Standing on 1/2 bolster (flat side up): -feet apart, alternate UE lift x5 reps with heavy posterior loss of balance requiring min A -tandem stance 10 sec hold x2 reps each foot in front  Progressed to lateral head turns side/side x4 reps;   Pt educated throughout session about proper posture and technique with exercises. Improved  exercise technique, movement at target joints, use of target muscles after min to mod verbal, visual, tactile cues.        PATIENT EDUCATION: Education details: Exercise technique/positioning; HEP Person educated: Patient Education method: Explanation, Demonstration, and Verbal cues Education comprehension: verbalized understanding, returned demonstration, and needs further education   HOME EXERCISE PROGRAM: No changes this session;   Instructed patient in pool exercise: Access Code: [email or web address] URL: [email or web address] Date: 10/22/2021 Prepared by: Blanche East  Exercises - Seated Knee Extension with Resistance  - 1 x daily - 7 x weekly - 2 sets - 15 reps - Forward and Backward Stepping at [employer]  - 1 x daily - 7 x weekly - 1 sets - 5 reps - Freestyle Swimming with Snorkel Mask  - 1 x daily - 7 x weekly - 1 sets - 2-3 reps - Heel Walking  - 1 x daily - 7 x weekly - 1 sets - 5 reps - Toe Walking  - 1 x daily - 7 x weekly  - 1 sets - 5 reps - Single Leg Stance at Pool Wall  - 1 x daily - 7 x weekly - 1 sets - 3-5 reps - 15-30 sec hold   PT Short Term Goals -       PT SHORT TERM GOAL #1   Title Patient will be adherent to HEP at least 3x a week to improve functional strength and balance for better safety at home.    Baseline 9/21: Pt reports compliance with HEP and is confident, 1/3: doing exercise 2x a week; 06/25/21: 3x/week, 5/18: consistent;    Time 4    Period Weeks    Status Achieved    Target Date 06/25/21      PT SHORT TERM GOAL #2   Title Patient will be independent in transferring sit<>Stand without pushing on arm rests to improve ability to get up from chair.    Baseline 9/21: pt able to complete STS without use of UEs, 1/3:able to stand but has moderate difficulty requiring increased time; 06/25/21: able to perform indep without hands but requiring use of momentum to attain standing. Remains stable. 5/18: able to stand without UE assist, close supervision, remains stable;    Time 4    Period Weeks    Status Achieved    Target Date 07/23/21              PT Long Term Goals -      PT LONG TERM GOAL #1   Title Patient (> 34 years old) will complete five times sit to stand test in < 15 seconds indicating an increased LE strength and improved balance.    Baseline 9/21: 29 seconds hands-free 10/10: deferred 10/12: 34 sec hands free; 11/2: 23.8 sec hands-free; 12/7: 33.2 sec hands free, 1/3: 33.92 hands free; 06/25/21: 25.69 sec, 4/13: 15.5 sec hands free, 5/18: 23 sec without UE 6/22: deferred   Time 8    Period Weeks    Status Partially Met    Target Date  12/17/21     PT LONG TERM GOAL #2   Title Patient will increase six minute walk test distance to >400 feet for improve gait ability    Baseline 9/21: 368 ft with RW; 10/10: deferred 10/12: 408 ft; 11/2: 476 ft with 4WW, 1/3: 475 feet with RW; 4/13: 450 feet without AD CGA 10/15/21: 460 feet without AD, CGA   Time 8    Period Weeks    Status  Achieved    Target Date 12/17/21     PT LONG TERM GOAL #3   Title Patient will increase 10 meter walk test to >1.29ms as to improve gait speed for better community ambulation and to reduce fall risk.    Baseline 9/21: 0.5 m/s with RW; 10/10 deferred 10/12: 0.93 m/s with 45BX 11/2: 0.83 m/s with 40XY 12/7: 0.52 m/s with RW, 1/3: 0.704; 06/25/21: 0.55 m/s with SPC. 5/18: 0.4 m/s without AD , 10/15/21 0.507 m/s without AD   Time 8    Period Weeks    Status Partially Met   Target Date 12/17/21     PT LONG TERM GOAL #4   Title Patient will be independent with ascend/descend 6 steps using single UE in step over step pattern without LOB.    Baseline 9/21:  Patient uses PT stairs (4 steps) with bilateral upper extremity support on handrails for both ascending/descending.  Patient exhibits reciprocal pattern ascending and step to pattern descending. 10/10: deferred; 10/12: ascending/descending recip. steps with BUE support; 11/2: Ascended/descended 8 steps total, reciprocal pattern ascending and step-to descending. Pt uses UUE support throughout with CGA assist.; 12/7: Ascended and descended 8 steps total with UUE support and CGA. Reciprocal stepping ascending and step to descending., 1/3: Deferrred; 06/25/21: able to perform with SUE support and reciprocal pattern asc/desc, but does require close CGA with descending. 5/18: independent with 2 handrails, close supervision with 1 handrai; 10/15/21: requires 2 handrails   Time 8    Period Weeks    Status Partially Met    Target Date 12/17/21     PT LONG TERM GOAL #5   Title Patient will demonstrate an improved Berg Balance Score of >45/56 as to demonstrate improved balance with ADLs such as sitting/standing and transfer balance and reduced fall risk.    Baseline 9/21: deferred d/t time; 9/29: 42/56; 10/10: deferred; 10/12: 44/56; 11/2: deferred; 11/7: 46/56, 1/3: deferred    Time 8    Period Weeks    Status Achieved    Target Date 12/17/21     PT LONG TERM  GOAL #6   Title Patient will improve FOTO score to >60% to indicate improved functional mobility with ADLs.    Baseline 9/21: 59; 10/10: 57; 11/2: 60; 12/7: 58%, 1/3: 61%, 4/13: 56% 10/15/21: 58%   Time 8    Period Weeks    Status Partially Met   Target Date 12/17/21     PT LONG TERM GOAL #7   Title Pt will improve FGA by a minimum of 5 points to indicate clinically significant improvement in reduced risk of falls with walking tasks.    Baseline 06/25/21: 12, 4/13: deferred, 5/18: deferred due to increased ankle discomfort; 6/22: deferred due to elevated BP- will assess next visit; 10/20/21: 8/30   Time 8    Period Weeks    Status Not met   Target Date 12/17/21             Plan -    Clinical Impression Statement Patient motivated and participated well within session. She was instructed in advanced LE strengthening, utilizing weight machine  for strengthening. She does require min VCS for correct exercise technique.  Patient instructed in advanced balance exercise. Utilized compliant surface to challenge stance control. Patient requires intermittent rail assist throughout session and CGA to min A for safety. Vitals monitored throughout session. She does require intermittent rest breaks;  She would benefit from additional skilled PT intervention to improve strength, balance and mobility;  Personal Factors and Comorbidities Age    Comorbidities HTN, CKD, fall risk, type 2 diabetes,    Examination-Activity Limitations Lift;Locomotion Level;Squat;Stairs;Stand;Toileting;Transfers;Bathing    Examination-Participation Restrictions Cleaning;Community Activity;Laundry;Meal Prep;Shop;Volunteer;Driving    Stability/Clinical Decision Making Stable/Uncomplicated    Rehab Potential Fair    PT Frequency 2x / week    PT Duration 8 weeks    PT Treatment/Interventions Cryotherapy;Electrical Stimulation;Moist Heat;Gait training;Stair training;Functional mobility training;Therapeutic  activities;Therapeutic exercise;Neuromuscular re-education;Balance training;Patient/family education;Orthotic Fit/Training;Energy conservation    PT Next Visit Plan work on LE strengthening and balance; gait training, endurance, continue POC as previously indicated    PT Home Exercise Plan    Consulted and Agree with Plan of Care Patient                 Lashawn Orrego, PT, DPT 10/30/2021, 9:41 AM

## 2021-10-29 NOTE — Therapy (Signed)
OUTPATIENT OCCUPATIONAL THERAPY TREATMENT NOTE   Patient Name: Deeksha Cotrell MRN: [id number] DOB:Jul 26, 1928, 88 y.o., female Today's Date: 10/29/2021   REFERRING PROVIDER: Tracie Harrier, MD   OT End of Session - 10/29/21 1305     Visit Number 22    Number of Visits 114    Date for OT Re-Evaluation 12/28/21    Authorization Time Period Reporting period starting 09/03/2021    OT Start Time 1300    OT Stop Time 1345    OT Time Calculation (min) 45 min    Activity Tolerance Patient tolerated treatment well    Behavior During Therapy WFL for tasks assessed/performed             Past Medical History:  Diagnosis Date   Anemia    Cough    RESOLVING, NO FEVER   Diabetes mellitus without complication ([place])    Gout    Hypertension    Hypothyroidism    PUD (peptic ulcer disease)    Stroke [facility])    Wears dentures    full upper, partial lower   Past Surgical History:  Procedure Laterality Date   AMPUTATION TOE Left 12/14/2017   Procedure: AMPUTATION TOE-4TH MPJ;  Surgeon: Samara Deist, DPM;  Location: [place];  Service: Podiatry;  Laterality: Left;  IVA LOCAL Diabetic - oral meds   APPENDECTOMY     CATARACT EXTRACTION W/PHACO Right 06/23/2015   Procedure: CATARACT EXTRACTION PHACO AND INTRAOCULAR LENS PLACEMENT (IOC);  Surgeon: Estill Cotta, MD;  Location: [facility] ORS;  Service: Ophthalmology;  Laterality: Right;  [place] 01:28 AP% 23.4 CDE 36.14 fluid pack lot # [id number]   CATARACT EXTRACTION W/PHACO Left 07/28/2015   Procedure: CATARACT EXTRACTION PHACO AND INTRAOCULAR LENS PLACEMENT (IOC);  Surgeon: Estill Cotta, MD;  Location: [facility] OR[facility];  Service: Ophthalmology;  Laterality: Left;  [place]    1:29.7 AP%  24.9 CDE   41.32 fluid casette lot #[id number] H exp 09/23/2016   COONOSCOPY     AND ENDOSCOPY   DILATION AND CURETTAGE OF UTERUS     ENDARTERECTOMY Left 11/13/2020   Procedure: Evacuation of left neck hematoma;  Surgeon: Katha Cabal, MD;  Location: [facility]  ORS;  Service: Vascular;  Laterality: Left;   ENDARTERECTOMY Left 11/13/2020   Procedure: ENDARTERECTOMY CAROTID;  Surgeon: Algernon Huxley, MD;  Location: [facility] ORS;  Service: Vascular;  Laterality: Left;   TONSILLECTOMY     TUBAL LIGATION     Patient Active Problem List   Diagnosis Date Noted   Carotid stenosis, symptomatic, with infarction ([place]) 11/13/2020   Gout flare 11/10/2020   UTI (urinary tract infection) 11/10/2020   Left carotid artery stenosis 11/10/2020   Chronic kidney disease 11/10/2020   Left basal ganglia embolic stroke ([place]) 62/95/2841   PAC (premature atrial contraction)    Pre-procedural cardiovascular examination    Ischemic stroke ([place]) 10/20/2020   TIA (transient ischemic attack) 10/19/2020   Right hemiparesis ([place]) 10/19/2020   Type 2 diabetes mellitus with stage 3 chronic kidney disease, without long-term current use of insulin ([place]) 08/24/2018   Hyperlipidemia, unspecified 07/19/2017   Hypertension 07/19/2017   PUD (peptic ulcer disease) 07/19/2017   Type 2 diabetes mellitus ([place]) 07/19/2017   Personal history of gout 08/12/2016   Anemia, unspecified 04/09/2016   Hypothyroidism, acquired 12/10/2015   REFERRING DIAG: CVA  THERAPY DIAG:  Other lack of coordination  Rationale for Evaluation and Treatment Rehabilitation  PERTINENT HISTORY:   R ankle pain, gout, T2DM, HTN, CKDIII; R/L carotid endarectomy  PRECAUTIONS:  None  SUBJECTIVE: Pt. Reports doing well today  PAIN:  Are you having pain? No     OBJECTIVE:   TODAY'S TREATMENT:     Pt. worked on [facility] skills manipulating nuts, and bolts on a bolt board. Pt. worked on screwing, and unscrewing nuts, and bolts of varying sizes, and  challenging progressively smaller items. Pt. worked with her vision occluded from the task.  Pt. was able to unscrew the nuts from the bolts, however dropped the 1", and 1/2", and 1/4" nuts once they have been disconnected. Pt. Presented with increased difficulty  reconnecting the the largest, and smallest nuts onto the base of the bolts.  Pt. required verbal cues, and cues for visual demonstration for the movement patterns. Pt. Continues to work on improving RUE motor control, and [facility] skills.    PATIENT EDUCATION: Education details: Right hand motor control, and coordination skills. Person educated: Patient Education method: Explanation, Demonstration, Tactile cues, and Handouts Education comprehension: verbalized understanding, returned demonstration, verbal cues required, and needs further education   HOME EXERCISE PROGRAM  Pt. to continue with current HEPs with ongoing updates as indicated   OT Short Term Goals - 03/23/21 1657       OT SHORT TERM GOAL #1   Title Pt will perform HEP for RUE strength/coordination independently.    Baseline Eval: HEP not yet established in outpatient, but pt is working with putty from [name]; 12/04/2020; [facility] HEP initiated this day with mod vc after return demo; 01/05/2021: indep with currently program, but ongoing as pt progresses; 01/28/2021: vc to revisit and focus on grip and pinch strengthening with putty; 03/23/21: pt is indep with HEP    Time 6    Period Weeks    Status Achieved    Target Date 03/23/21              OT Long Term Goals - 10/15/21 1322       OT LONG TERM GOAL #1   Title Pt will improve hand writing to 100% legibility with R hand to be able to independently write a check.    Baseline Eval: signature is 75% legible with R hand, requiring extra time; 12/04/2020: no chan to complete.ge from eval; 01/05/2021: Printing is 100% legible, signature is 90% legible, but still requires extra time and effort.; 01/26/21: Signature is 90% legible and speed/fluidity have improved. 20th visit: 90% legible, 03/23/21: 90% legible, extra time 40th visit 90 % legibility with increased time, 50th visit: 90% legible with with increased time required 07/14/2021: 90% legibility. 07/30/2021: 90% legibility, pt. presses too  hard through the pen, and pencil. 09/03/2021: 90% legibility; 10/06/21: Increased time, but pt states she signs cards and writes short notes on a card sufficiently.  Pt states she has no need to write checks anymore as her family assists with this and she has no desire to return to this, but she would like to work on her pressure modulation.  New goal below. 10/15/2021: Pt. conitnues to require increased time to complete note cards, and corespondence.    Time 12    Period Weeks    Status Partially Met    Target Date 12/28/21      OT LONG TERM GOAL #2   Title Pt will improve R hand coordination to enable good manipulation of iphone using R dominant hand      OT LONG TERM GOAL #3   Title Pt will improve GMC throughout RUE to enable pt to reach for and pick up  ADL supplies with R dominant hand without dropping or knocking over objects.    Baseline Eval: Pt reports RUE is clumsy and easily knocks over objects when trying to reach for ADL supplies.  Pt verbalizes that her "aim is off."; 12/04/2020: pt reports slight improvement since eval and has started using her R hand to eat, but still feels clumsy; 01/05/2021: Greatly improved; pt is using silverware to eat with R hand, can comb hair with RUE, still mildly ataxic; 01/26/21: pt reports difficulty picking up objects within a small space (ie; may knock the toothpaste holder down when reaching for toothbrush), but reaching for light/larger objects is improving 20th visit: pt. continues to be mildly ataxic, however is consistently improving with motor control, and [place] while reaching targets; 03/23/21: Pt reaches with accuracy towards targets if she takes her time (pt drops and knocks things over when moving at a faster/normal pace) 04/23/2021: Pt. continues to present with difficulty with depth perception, and impaired [place].40th visit: Pt. is improving, however tends to knock over lighter weight objects.50th visit: Pt. has improved with reaching for items without  knocking them over, however continues to need work on accuracy.07/14/2021: Pt. is improving while motor control, and accuracy of reaching for objects. Pt. continues to work towards improving efficiency with reaching for objects. 07/30/2021: Pt. is able to able to reach for objects more accurately, however continues to present with limited motor control in the Right. 09/03/2021: Pt. Pt. is improving with reaching up without knocking items over. pt. is now able to stand at the sink and reach up without dropping items; 10/06/21: pt estimates she reaches with 85-90% accurate with RUE, occasional dropping. 10/15/2021: Pt. reports 85-90% accureacy, however presents with decreased accuracy the higher, or lower she reaches.    Time 12    Period Weeks    Status On-going    Target Date 12/28/21      OT LONG TERM GOAL #4   Title Pt will improve FOTO score to 68 or better to indicate measurable functional improvement.    Baseline Eval: FOTO 55; 01/05/2021: FOTO 61; 01/28/21: FOTO 61, 20th visit: 61; 03/23/21: FOTO 60 4oth visit: FOTO score: 61, 50th visit: FOTO 69 07/30/2021: FOTO 71  09/03/2021: FOTO 73; 10/06/21: 73 10/15/2021: 77    Time 12    Period Weeks    Status On-going      OT LONG TERM GOAL #6   Title Pt. will improve right hand [facility] skills to be able to indepedendently manipulate and set-up her medication/pills in the pillbox.    Baseline 07/14/2021: Pt. has difficulty manipulating small pills, and often drops them. 07/30/2021: Pt. continues to have difficulty grasping pills/medication for set-up of pillbox. 09/03/2021: Pt. is able to grasp pills from the pillbox without dropping them; 10/06/21: modified indep (with extra time to complete). 10/15/2021: Pt. reports requiring increased time to complete.    Time 12    Period Weeks    Status Partially Met    Target Date 12/28/21      OT LONG TERM GOAL #7   Title Pt will improve accuracy with graded force through RUE during functional tasks per self report.     Baseline 10/06/21: Pt reports she holds her greatgrandchild too tightly, scratches her arm too forcefully, and sometimes writes with too much pressure; pt struggles to grade force for functional activities.10/15/2021: Pt. continues to grasp items with the left hand too tightly.    Time 12    Period Weeks  Status Achieved              Plan - 10/22/21 1311     Clinical Impression Statement Pt. was able to unscrew the nuts from the bolts, however dropped the 1", and 1/2", and 1/4" nuts once they have been disconnected. Pt. Presented with increased difficulty reconnecting the the largest, and smallest nuts onto the base of the bolts.  Pt. required verbal cues, and cues for visual demonstration for the movement patterns. Pt. Continues to work on improving RUE motor control, and [facility] skills.      OT Occupational Profile and History Detailed Assessment- Review of Records and additional review of physical, cognitive, psychosocial history related to current functional performance    Occupational performance deficits (Please refer to evaluation for details): ADL's;Leisure;IADL's    Body Structure / Function / Physical Skills ADL;Coordination;Endurance;GMC;UE functional use;Balance;Sensation;Body mechanics;IADL;Pain;Dexterity;FMC;Strength;Gait;Mobility    Rehab Potential Good    Clinical Decision Making Several treatment options, min-mod task modification necessary    Comorbidities Affecting Occupational Performance: May have comorbidities impacting occupational performance    Modification or Assistance to Complete Evaluation  Min-Moderate modification of tasks or assist with assess necessary to complete eval    OT Frequency 2x / week    OT Duration 12 weeks    OT Treatment/Interventions Self-care/ADL training;Therapeutic exercise;DME and/or AE instruction;Functional Mobility Training;Balance training;Neuromuscular education;Therapeutic activities;Patient/family education    Family Member Consulted  daughter, Emi Holes [place], MS, OTR/L  Harrel Carina, OT 10/29/2021, 1:18 PM

## 2021-11-03 ENCOUNTER — Ambulatory Visit: Payer: Medicare HMO | Admitting: Occupational Therapy

## 2021-11-03 ENCOUNTER — Ambulatory Visit: Payer: Medicare HMO

## 2021-11-03 VITALS — BP 151/50 | HR 73

## 2021-11-03 DIAGNOSIS — R269 Unspecified abnormalities of gait and mobility: Secondary | ICD-10-CM

## 2021-11-03 DIAGNOSIS — R278 Other lack of coordination: Secondary | ICD-10-CM | POA: Diagnosis not present

## 2021-11-03 DIAGNOSIS — R2689 Other abnormalities of gait and mobility: Secondary | ICD-10-CM

## 2021-11-03 DIAGNOSIS — R2681 Unsteadiness on feet: Secondary | ICD-10-CM

## 2021-11-03 DIAGNOSIS — M6281 Muscle weakness (generalized): Secondary | ICD-10-CM

## 2021-11-03 NOTE — Therapy (Signed)
[place] MAIN [facility] SERVICES [address] [place], [place], [address] Phone: [phone]   Fax:  [phone]  Physical Therapy Treatment  Patient Details  Name: Cassandra Schaefer MRN: [id number] Date of Birth: 1929-03-27 Referring Provider (PT): Dr. Dew/Dr. Ginette Pitman PCP   Encounter Date: 11/03/2021   PT End of Session - 11/03/21 1354     Visit Number 28    Number of Visits 102    Date for PT Re-Evaluation 12/17/21    Authorization Type [employer]    Authorization Time Period 10/22/21-12/17/21    Progress Note Due on Visit 90    PT Start Time 1349    PT Stop Time 1430    PT Time Calculation (min) 41 min    Equipment Utilized During Treatment Gait belt    Activity Tolerance Patient tolerated treatment well;No increased pain    Behavior During Therapy WFL for tasks assessed/performed             Past Medical History:  Diagnosis Date   Anemia    Cough    RESOLVING, NO FEVER   Diabetes mellitus without complication ([place])    Gout    Hypertension    Hypothyroidism    PUD (peptic ulcer disease)    Stroke [facility])    Wears dentures    full upper, partial lower    Past Surgical History:  Procedure Laterality Date   AMPUTATION TOE Left 12/14/2017   Procedure: AMPUTATION TOE-4TH MPJ;  Surgeon: Samara Deist, DPM;  Location: [place];  Service: Podiatry;  Laterality: Left;  IVA LOCAL Diabetic - oral meds   APPENDECTOMY     CATARACT EXTRACTION W/PHACO Right 06/23/2015   Procedure: CATARACT EXTRACTION PHACO AND INTRAOCULAR LENS PLACEMENT (IOC);  Surgeon: Estill Cotta, MD;  Location: [facility];  Service: Ophthalmology;  Laterality: Right;  [place] 01:28 AP% 23.4 CDE 36.14 fluid pack lot # [id number]   CATARACT EXTRACTION W/PHACO Left 07/28/2015   Procedure: CATARACT EXTRACTION PHACO AND INTRAOCULAR LENS PLACEMENT (IOC);  Surgeon: Estill Cotta, MD;  Location: [facility];  Service: Ophthalmology;  Laterality: Left;  [place]     1:29.7 AP%  24.9 CDE   41.32 fluid casette lot #[id number] H exp 09/23/2016   COONOSCOPY     AND ENDOSCOPY   DILATION AND CURETTAGE OF UTERUS     ENDARTERECTOMY Left 11/13/2020   Procedure: Evacuation of left neck hematoma;  Surgeon: Katha Cabal, MD;  Location: [facility];  Service: Vascular;  Laterality: Left;   ENDARTERECTOMY Left 11/13/2020   Procedure: ENDARTERECTOMY CAROTID;  Surgeon: Algernon Huxley, MD;  Location: [facility];  Service: Vascular;  Laterality: Left;   TONSILLECTOMY     TUBAL LIGATION      Vitals:   11/03/21 1353  BP: (!) 151/50  Pulse: 73  181/53 78bpm 1 minute post AMB 163/56 73bpm 3 minutes post AMB    Subjective Assessment - 11/03/21 1352     Subjective Pt has been working on her pool-based exercises with success. She reports to have no pain issued today.    Pertinent History 89yoF with medical history significant for type 2 diabetes mellitus, essential hypertension, acquired hypothyroidism, hyperlipidemia, stage III chronic kidney disease reports increased right sided weakness on 10/19/20. She was diagnosed with left basal ganglia ischemic stroke. She was discharged to inpatient rehab for 10 days and then discharged home. Patient was evaluated by outpatient PT on 11/12/20. CVA was due to carotid stenosis. She underwent left carotid endartectomy on 7/21.  Procedure went well however patient suffered from left cervical hematoma and had subsequent surgical procedure to stop the bleed and remove the hematoma on 11/13/20. They were concerned with her breathing and therefore intubated her for 1 day, she was in ICU for 2 days and transferred to med/surge on 11/15/20. Patient was discharged home on 11/17/20 and is now returning to outpatient PT. She has had the stiches removed and is healing well. Denies any neck pain or stiffness. She lives with her daughter and has 24/7 caregiver support. She is still using RW for most ambulation. She is able to transfer to bedside commode well and is  able to stand short time unsupported. She reports feeling very weak and fatigued. She reports she has been dragging her right foot more. She denies any numbness/tingling; She reports feeling like her legs are wanting to do more but is hesitant because of fear of falling. She is mod I for self care morning routine. She does still receive help with showering. She has been working on increasing her activity but denies any formal exercise.    Currently in Pain? No/denies            INTERVENTION THIS DATE:  -Standing on 1/2 bolster (round side up) -Heel raises x15 reps -ankle DF x10 reps (fatigue to failure after 11 reps)  -marching in place c BUE support on bar -AMB overground 325f, SPC, mild sway with 2 light LOB 0.32m -STS x5, hands free from chair + 2 folded sheets  -seated ankle DF x15 -STS x5, hands free from chair + 2 folded sheets -ankle DF x10 reps (fatigue to failure after 11 reps) -STS x5, hands free from chair + 2 folded sheets -lateral side steppin gat support bar 1x15 bilat -lateral step over at bar (1/2 roll) 1x10 -side step up/down 1x10 bilat c bar, then 8x with SPC (minguards to minA)      PT Education - 11/03/21 1408     Education Details safety with mobility    Person(s) Educated Patient    Methods Explanation    Comprehension Verbalized understanding;Returned demonstration            PT Short Term Goals -                PT SHORT TERM GOAL #1    Title Patient will be adherent to HEP at least 3x a week to improve functional strength and balance for better safety at home.     Baseline 9/21: Pt reports compliance with HEP and is confident, 1/3: doing exercise 2x a week; 06/25/21: 3x/week, 5/18: consistent;     Time 4     Period Weeks     Status Achieved     Target Date 06/25/21          PT SHORT TERM GOAL #2    Title Patient will be independent in transferring sit<>Stand without pushing on arm rests to improve ability to get up from chair.     Baseline  9/21: pt able to complete STS without use of UEs, 1/3:able to stand but has moderate difficulty requiring increased time; 06/25/21: able to perform indep without hands but requiring use of momentum to attain standing. Remains stable. 5/18: able to stand without UE assist, close supervision, remains stable;     Time 4     Period Weeks     Status Achieved     Target Date 07/23/21  PT Long Term Goals -               PT LONG TERM GOAL #1    Title Patient (> 38 years old) will complete five times sit to stand test in < 15 seconds indicating an increased LE strength and improved balance.     Baseline 9/21: 29 seconds hands-free 10/10: deferred 10/12: 34 sec hands free; 11/2: 23.8 sec hands-free; 12/7: 33.2 sec hands free, 1/3: 33.92 hands free; 06/25/21: 25.69 sec, 4/13: 15.5 sec hands free, 5/18: 23 sec without UE 6/22: deferred    Time 8     Period Weeks     Status Partially Met     Target Date  12/17/21         PT LONG TERM GOAL #2    Title Patient will increase six minute walk test distance to >400 feet for improve gait ability     Baseline 9/21: 368 ft with RW; 10/10: deferred 10/12: 408 ft; 11/2: 476 ft with 4WW, 1/3: 475 feet with RW; 4/13: 450 feet without AD CGA 10/15/21: 460 feet without AD, CGA    Time 8     Period Weeks     Status Achieved     Target Date 12/17/21         PT LONG TERM GOAL #3    Title Patient will increase 10 meter walk test to >1.81ms as to improve gait speed for better community ambulation and to reduce fall risk.     Baseline 9/21: 0.5 m/s with RW; 10/10 deferred 10/12: 0.93 m/s with 45IO 11/2: 0.83 m/s with 42VO 12/7: 0.52 m/s with RW, 1/3: 0.704; 06/25/21: 0.55 m/s with SPC. 5/18: 0.4 m/s without AD , 10/15/21 0.507 m/s without AD    Time 8     Period Weeks     Status Partially Met    Target Date 12/17/21         PT LONG TERM GOAL #4    Title Patient will be independent with ascend/descend 6 steps using single UE in step over step  pattern without LOB.     Baseline 9/21:  Patient uses PT stairs (4 steps) with bilateral upper extremity support on handrails for both ascending/descending.  Patient exhibits reciprocal pattern ascending and step to pattern descending. 10/10: deferred; 10/12: ascending/descending recip. steps with BUE support; 11/2: Ascended/descended 8 steps total, reciprocal pattern ascending and step-to descending. Pt uses UUE support throughout with CGA assist.; 12/7: Ascended and descended 8 steps total with UUE support and CGA. Reciprocal stepping ascending and step to descending., 1/3: Deferrred; 06/25/21: able to perform with SUE support and reciprocal pattern asc/desc, but does require close CGA with descending. 5/18: independent with 2 handrails, close supervision with 1 handrai; 10/15/21: requires 2 handrails    Time 8     Period Weeks     Status Partially Met     Target Date 12/17/21         PT LONG TERM GOAL #5    Title Patient will demonstrate an improved Berg Balance Score of >45/56 as to demonstrate improved balance with ADLs such as sitting/standing and transfer balance and reduced fall risk.     Baseline 9/21: deferred d/t time; 9/29: 42/56; 10/10: deferred; 10/12: 44/56; 11/2: deferred; 11/7: 46/56, 1/3: deferred     Time 8     Period Weeks     Status Achieved     Target Date 12/17/21         PT  LONG TERM GOAL #6    Title Patient will improve FOTO score to >60% to indicate improved functional mobility with ADLs.     Baseline 9/21: 59; 10/10: 57; 11/2: 60; 12/7: 58%, 1/3: 61%, 4/13: 56% 10/15/21: 58%    Time 8     Period Weeks     Status Partially Met    Target Date 12/17/21         PT LONG TERM GOAL #7    Title Pt will improve FGA by a minimum of 5 points to indicate clinically significant improvement in reduced risk of falls with walking tasks.     Baseline 06/25/21: 12, 4/13: deferred, 5/18: deferred due to increased ankle discomfort; 6/22: deferred due to elevated BP- will assess next visit;  10/20/21: 8/30    Time 8     Period Weeks     Status Not met    Target Date 12/17/21            Plan - 11/03/21 1409     Clinical Impression Statement Continuing with BLE strengthening and neuromuscular reeducation to maximize independence and tolerance to ADL-based mobility and to decrease falls risk. Pt has recurrent LOB throughout session, almost all related to insufficiency of anterior compartment and decreased righting strategies in the sagittal plane. Pt requires almost no help with her frontal plane LOB. Pt remains focused and motivated. Time is given to allow for development of right strategy practice using SPC. BP response to exercise remains quite elevated with quick resolution, however pt denies any CP or dyspnea this date.    Personal Factors and Comorbidities Age    Comorbidities HTN, CKD, fall risk, type 2 diabetes,    Examination-Activity Limitations Lift;Locomotion Level;Squat;Stairs;Stand;Toileting;Transfers;Bathing    Examination-Participation Restrictions Cleaning;Community Activity;Laundry;Meal Prep;Shop;Volunteer;Driving    Stability/Clinical Decision Making Stable/Uncomplicated    Clinical Decision Making Low    Rehab Potential Fair    PT Frequency 2x / week    PT Duration 8 weeks    PT Treatment/Interventions Cryotherapy;Electrical Stimulation;Moist Heat;Gait training;Stair training;Functional mobility training;Therapeutic activities;Therapeutic exercise;Neuromuscular re-education;Balance training;Patient/family education;Orthotic Fit/Training;Energy conservation    PT Next Visit Plan work on LE strengthening and balance; gait training, endurance, continue POC as previously indicated    PT Home Exercise Plan Access Code: [email or web address]; no updates    Consulted and Agree with Plan of Care Patient             Patient will benefit from skilled therapeutic intervention in order to improve the following deficits and impairments:  Abnormal gait, Decreased balance,  Decreased endurance, Decreased mobility, Difficulty walking, Cardiopulmonary status limiting activity, Decreased activity tolerance, Decreased coordination, Decreased strength  Visit Diagnosis: Other lack of coordination  Muscle weakness (generalized)  Unsteadiness on feet  Other abnormalities of gait and mobility  Abnormality of gait and mobility     Problem List Patient Active Problem List   Diagnosis Date Noted   Carotid stenosis, symptomatic, with infarction ([place]) 11/13/2020   Gout flare 11/10/2020   UTI (urinary tract infection) 11/10/2020   Left carotid artery stenosis 11/10/2020   Chronic kidney disease 11/10/2020   Left basal ganglia embolic stroke ([place]) 91/50/5697   PAC (premature atrial contraction)    Pre-procedural cardiovascular examination    Ischemic stroke ([place]) 10/20/2020   TIA (transient ischemic attack) 10/19/2020   Right hemiparesis ([place]) 10/19/2020   Type 2 diabetes mellitus with stage 3 chronic kidney disease, without long-term current use of insulin ([place]) 08/24/2018   Hyperlipidemia, unspecified 07/19/2017   Hypertension 07/19/2017  PUD (peptic ulcer disease) 07/19/2017   Type 2 diabetes mellitus ([place]) 07/19/2017   Personal history of gout 08/12/2016   Anemia, unspecified 04/09/2016   Hypothyroidism, acquired 12/10/2015   2:40 PM, 11/03/21 Etta Grandchild, PT, DPT Physical Therapist - [facility]  [phone])    [place], PT 11/03/2021, 2:14 PM  [place] MAIN [facility] SERVICES [address] [place], [place], [address] Phone: [phone]   Fax:  [phone]  Name: Cassandra Schaefer MRN: [id number] Date of Birth: 1928-06-06

## 2021-11-05 ENCOUNTER — Encounter: Payer: Self-pay | Admitting: Physical Therapy

## 2021-11-05 ENCOUNTER — Ambulatory Visit: Payer: Medicare HMO | Admitting: Physical Therapy

## 2021-11-05 ENCOUNTER — Ambulatory Visit: Payer: Medicare HMO | Admitting: Occupational Therapy

## 2021-11-05 ENCOUNTER — Encounter: Payer: Self-pay | Admitting: Occupational Therapy

## 2021-11-05 DIAGNOSIS — M6281 Muscle weakness (generalized): Secondary | ICD-10-CM

## 2021-11-05 DIAGNOSIS — R2681 Unsteadiness on feet: Secondary | ICD-10-CM

## 2021-11-05 DIAGNOSIS — R2689 Other abnormalities of gait and mobility: Secondary | ICD-10-CM

## 2021-11-05 DIAGNOSIS — R278 Other lack of coordination: Secondary | ICD-10-CM | POA: Diagnosis not present

## 2021-11-05 DIAGNOSIS — R269 Unspecified abnormalities of gait and mobility: Secondary | ICD-10-CM

## 2021-11-05 NOTE — Therapy (Signed)
OUTPATIENT PHYSICAL THERAPY TREATMENT NOTE    Patient Name: Cassandra Schaefer MRN: [id number] DOB:[birth year], 88 y.o., female Today's Date: 11/05/2021  PCP: Roetta Sessions MD REFERRING PROVIDER: Roetta Sessions MD   PT End of Session - 11/05/21 1346     Visit Number 27    Number of Visits 102    Date for PT Re-Evaluation 12/17/21    Authorization Type [employer]    Authorization Time Period 10/22/21-12/17/21    Progress Note Due on Visit 90    PT Start Time 1347    PT Stop Time 1430    PT Time Calculation (min) 43 min    Equipment Utilized During Treatment Gait belt    Activity Tolerance Patient tolerated treatment well;No increased pain    Behavior During Therapy WFL for tasks assessed/performed              Past Medical History:  Diagnosis Date   Anemia    Cough    RESOLVING, NO FEVER   Diabetes mellitus without complication ([place])    Gout    Hypertension    Hypothyroidism    PUD (peptic ulcer disease)    Stroke [facility])    Wears dentures    full upper, partial lower   Past Surgical History:  Procedure Laterality Date   AMPUTATION TOE Left 12/14/2017   Procedure: AMPUTATION TOE-4TH MPJ;  Surgeon: Samara Deist, DPM;  Location: [place];  Service: Podiatry;  Laterality: Left;  IVA LOCAL Diabetic - oral meds   APPENDECTOMY     CATARACT EXTRACTION W/PHACO Right 06/23/2015   Procedure: CATARACT EXTRACTION PHACO AND INTRAOCULAR LENS PLACEMENT (IOC);  Surgeon: Estill Cotta, MD;  Location: [facility];  Service: Ophthalmology;  Laterality: Right;  [place] 01:28 AP% 23.4 CDE 36.14 fluid pack lot # [id number]   CATARACT EXTRACTION W/PHACO Left 07/28/2015   Procedure: CATARACT EXTRACTION PHACO AND INTRAOCULAR LENS PLACEMENT (IOC);  Surgeon: Estill Cotta, MD;  Location: [facility];  Service: Ophthalmology;  Laterality: Left;  [place]    1:29.7 AP%  24.9 CDE   41.32 fluid casette lot #[id number] H exp 09/23/2016   COONOSCOPY     AND ENDOSCOPY   DILATION AND CURETTAGE OF UTERUS      ENDARTERECTOMY Left 11/13/2020   Procedure: Evacuation of left neck hematoma;  Surgeon: Katha Cabal, MD;  Location: [facility];  Service: Vascular;  Laterality: Left;   ENDARTERECTOMY Left 11/13/2020   Procedure: ENDARTERECTOMY CAROTID;  Surgeon: Algernon Huxley, MD;  Location: [facility];  Service: Vascular;  Laterality: Left;   TONSILLECTOMY     TUBAL LIGATION     Patient Active Problem List   Diagnosis Date Noted   Carotid stenosis, symptomatic, with infarction ([place]) 11/13/2020   Gout flare 11/10/2020   UTI (urinary tract infection) 11/10/2020   Left carotid artery stenosis 11/10/2020   Chronic kidney disease 11/10/2020   Left basal ganglia embolic stroke ([place]) 82/99/3716   PAC (premature atrial contraction)    Pre-procedural cardiovascular examination    Ischemic stroke ([place]) 10/20/2020   TIA (transient ischemic attack) 10/19/2020   Right hemiparesis ([place]) 10/19/2020   Type 2 diabetes mellitus with stage 3 chronic kidney disease, without long-term current use of insulin ([place]) 08/24/2018   Hyperlipidemia, unspecified 07/19/2017   Hypertension 07/19/2017   PUD (peptic ulcer disease) 07/19/2017   Type 2 diabetes mellitus ([place]) 07/19/2017   Personal history of gout 08/12/2016   Anemia, unspecified 04/09/2016   Hypothyroidism, acquired 12/10/2015    REFERRING DIAG: CVA with right  hemiparesis  THERAPY DIAG:  Muscle weakness (generalized)  Unsteadiness on feet  Other abnormalities of gait and mobility  Abnormality of gait and mobility  Rationale for Evaluation and Treatment Rehabilitation  PERTINENT HISTORY: 88 y.o. female with medical history significant for type 2 diabetes mellitus, essential hypertension, acquired hypothyroidism, hyperlipidemia, stage III chronic kidney disease reports increased right sided weakness on 10/19/20. She was diagnosed with left basal ganglia ischemic stroke. She was discharged to inpatient rehab for 10 days and then discharged home. Patient was  evaluated by outpatient PT on 11/12/20. CVA was due to carotid stenosis. She underwent left carotid endartectomy on 7/21. Procedure went well however patient suffered from left cervical hematoma and had subsequent surgical procedure to stop the bleed and remove the hematoma on 11/13/20. They were concerned with her breathing and therefore intubated her for 1 day, she was in ICU for 2 days and transferred to med/surge on 11/15/20. Patient was discharged home on 11/17/20 and is now returning to outpatient PT. She has had the stiches removed and is healing well. Denies any neck pain or stiffness. She lives with her daughter and has 24/7 caregiver support. She is still using RW for most ambulation. She is able to transfer to bedside commode well and is able to stand short time unsupported. She reports feeling very weak and fatigued. She reports she has been dragging her right foot more. She denies any numbness/tingling; She reports feeling like her legs are wanting to do more but is hesitant because of fear of falling. She is mod I for self care morning routine. She does still receive help with showering. She has been working on increasing her activity but denies any formal exercise.   PRECAUTIONS: Fall risk  SUBJECTIVE: Pt reports doing well with no significant changes since last session.   PAIN:  Are you having pain? No   TODAY'S TREATMENT:   BP at start of session: 160/50, HR62  TA  Standing on bottom step: Hip flexion march against green tband x15 reps each LE with BUE rail assist;  Standing HR/ toe raise x 10 ea with UE assist   Gait around gym x180 feet with tripod base cane, CGA with reciprocal gait pattern, intermittent RLE foot drag, improved gait pattern in hallway with less object navigation and walking in straight line.   BP after exercise/gait: 180/50, HR: 72 BP 3 min post exercise: 171/61 HR 66   Ambulating with tripod based cane weaving through cones x 6 cones approximately 2-3 feet  apart x 4 repetitions through. No errors, slow gait pattern and inconsistent use of cane with proper sequence. 1 LOB self corrected by patient with turing to right around cone.   Gait around gym x80 feet practicing with cane in L UE to provide increased R LE stability with gait. Instruction for cane placement strategy provided and with practice and feedback throughout trial pt progressed from step to pattern of gait to step through pattern. Pt states she will continue to practice this at home.    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 Improved exercise technique, movement at target joints, use of target muscles after  min to mod verbal, visual, tactile cues.    Pt required occasional rest breaks due fatigue and likely increase in blood pressure, PT was quick to ask when pt appeared to be fatiguing in order to prevent excessive fatigue.     PATIENT EDUCATION: Education details: Exercise technique/positioning; HEP Person educated: Patient Education method: Explanation, Demonstration, and Verbal cues Education comprehension: verbalized understanding, returned demonstration, and needs further education   HOME EXERCISE PROGRAM: No changes this session;   Instructed patient in pool exercise: Access Code: [email or web address] URL: [email or web address] Date: 10/22/2021 Prepared by: Blanche East  Exercises - Seated Knee Extension with Resistance  - 1 x daily - 7 x weekly - 2 sets - 15 reps - Forward and Backward Stepping at [employer]  - 1 x daily - 7 x weekly - 1 sets - 5 reps - Freestyle Swimming with Snorkel Mask  - 1 x daily - 7 x weekly - 1 sets - 2-3 reps - Heel Walking  - 1 x daily - 7  x weekly - 1 sets - 5 reps - Toe Walking  - 1 x daily - 7 x weekly - 1 sets - 5 reps - Single Leg Stance at Pool Wall  - 1 x daily - 7 x weekly - 1 sets - 3-5 reps - 15-30 sec hold   PT Short Term Goals -       PT SHORT TERM GOAL #1   Title Patient will be adherent to HEP at least 3x a week to improve functional strength and balance for better safety at home.    Baseline 9/21: Pt reports compliance with HEP and is confident, 1/3: doing exercise 2x a week; 06/25/21: 3x/week, 5/18: consistent;    Time 4    Period Weeks    Status Achieved    Target Date 06/25/21      PT SHORT TERM GOAL #2   Title Patient will be independent in transferring sit<>Stand without pushing on arm rests to improve ability to get up from chair.    Baseline 9/21: pt able to complete STS without use of UEs, 1/3:able to stand but has moderate difficulty requiring increased time; 06/25/21: able to perform indep without hands but requiring use of momentum to attain standing. Remains stable. 5/18: able to stand without UE assist, close supervision, remains stable;    Time 4    Period Weeks    Status Achieved    Target Date 07/23/21              PT Long Term Goals -      PT LONG TERM GOAL #1   Title Patient (> 48 years old) will complete five times sit to stand test in < 15 seconds indicating an increased LE strength and improved balance.    Baseline 9/21: 29 seconds hands-free 10/10: deferred 10/12: 34 sec hands free; 11/2: 23.8 sec hands-free; 12/7: 33.2 sec hands free, 1/3: 33.92 hands free; 06/25/21: 25.69 sec, 4/13: 15.5 sec hands free, 5/18: 23 sec without UE 6/22: deferred   Time 8    Period Weeks    Status Partially Met    Target Date  12/17/21     PT LONG TERM GOAL #2   Title Patient will increase six minute walk test distance to >400 feet for improve gait ability    Baseline 9/21: 368 ft with RW; 10/10: deferred 10/12: 408 ft; 11/2: 476 ft with 4WW, 1/3: 475 feet with RW; 4/13: 450 feet without AD CGA  10/15/21: 460 feet without AD, CGA   Time 8    Period Weeks    Status Achieved    Target Date 12/17/21     PT LONG TERM GOAL #3   Title Patient will increase 10 meter walk test to >1.73ms as to improve gait speed for better community ambulation and to reduce fall risk.    Baseline 9/21: 0.5 m/s with RW; 10/10 deferred 10/12: 0.93 m/s with 48XQ 11/2: 0.83 m/s with 41JH 12/7: 0.52 m/s with RW, 1/3: 0.704; 06/25/21: 0.55 m/s with SPC. 5/18: 0.4 m/s without AD , 10/15/21 0.507 m/s without AD   Time 8    Period Weeks    Status Partially Met   Target Date 12/17/21     PT LONG TERM GOAL #4   Title Patient will be independent with ascend/descend 6 steps using single UE in step over step pattern without LOB.    Baseline 9/21:  Patient uses PT stairs (4 steps) with bilateral upper extremity support on handrails for both ascending/descending.  Patient exhibits reciprocal pattern ascending and step to pattern descending. 10/10: deferred; 10/12: ascending/descending recip. steps with BUE support; 11/2: Ascended/descended 8 steps total, reciprocal pattern ascending and step-to descending. Pt uses UUE support throughout with CGA assist.; 12/7: Ascended and descended 8 steps total with UUE support and CGA. Reciprocal stepping ascending and step to descending., 1/3: Deferrred; 06/25/21: able to perform with SUE support and reciprocal pattern asc/desc, but does require close CGA with descending. 5/18: independent with 2 handrails, close supervision with 1 handrai; 10/15/21: requires 2 handrails   Time 8    Period Weeks    Status Partially Met    Target Date 12/17/21     PT LONG TERM GOAL #5   Title Patient will demonstrate an improved Berg Balance Score of >45/56 as to demonstrate improved balance with ADLs such as sitting/standing and transfer balance and reduced fall risk.    Baseline 9/21: deferred d/t time; 9/29: 42/56; 10/10: deferred; 10/12: 44/56; 11/2: deferred; 11/7: 46/56, 1/3: deferred    Time 8     Period Weeks    Status Achieved    Target Date 12/17/21     PT LONG TERM GOAL #6   Title Patient will improve FOTO score to >60% to indicate improved functional mobility with ADLs.    Baseline 9/21: 59; 10/10: 57; 11/2: 60; 12/7: 58%, 1/3: 61%, 4/13: 56% 10/15/21: 58%   Time 8    Period Weeks    Status Partially Met   Target Date 12/17/21     PT LONG TERM GOAL #7   Title Pt will improve FGA by a minimum of 5 points to indicate clinically significant improvement in reduced risk of falls with walking tasks.    Baseline 06/25/21: 12, 4/13: deferred, 5/18: deferred due to increased ankle discomfort; 6/22: deferred due to elevated BP- will assess next visit; 10/20/21: 8/30   Time 8    Period Weeks    Status Not met   Target Date 12/17/21             Plan -    Clinical Impression Statement Continued with current plan of care as laid out in evaluation and recent prior sessions. Pt remains motivated to advance progress toward goals in order to maximize independence and safety at home. Pt requires high level assistance and cuing for completion of exercises in order to provide adequate level of stimulation and perturbation. Author allows pt as much opportunity as possible to perform  independent righting strategies, only stepping in when pt is unable to prevent falling to floor. Pt closely monitored throughout session for safe vitals response and to maximize patient safety during interventions. Pt progressed and challenged with using cane in L UE this session with good success. Pt continues to demonstrate progress toward goals AEB progression of some interventions this date either in volume or intensity.    Personal Factors and Comorbidities Age    Comorbidities HTN, CKD, fall risk, type 2 diabetes,    Examination-Activity Limitations Lift;Locomotion Level;Squat;Stairs;Stand;Toileting;Transfers;Bathing    Examination-Participation Restrictions Cleaning;Community Activity;Laundry;Meal  Prep;Shop;Volunteer;Driving    Stability/Clinical Decision Making Stable/Uncomplicated    Rehab Potential Fair    PT Frequency 2x / week    PT Duration 8 weeks    PT Treatment/Interventions Cryotherapy;Electrical Stimulation;Moist Heat;Gait training;Stair training;Functional mobility training;Therapeutic activities;Therapeutic exercise;Neuromuscular re-education;Balance training;Patient/family education;Orthotic Fit/Training;Energy conservation    PT Next Visit Plan work on LE strengthening and balance; gait training, endurance, continue POC as previously indicated    PT Home Exercise Plan No changes this date    Consulted and Agree with Plan of Care Patient                 Particia Lather, PT, DPT 11/05/2021, 2:45 PM

## 2021-11-05 NOTE — Therapy (Signed)
OUTPATIENT OCCUPATIONAL THERAPY TREATMENT NOTE   Patient Name: Cassandra Schaefer MRN: [id number] DOB:1928/05/21, 88 y.o., female Today's Date: 11/05/2021   REFERRING PROVIDER: Tracie Harrier, MD   OT End of Session - 11/05/21 1306     Visit Number 15    Number of Visits 114    Date for OT Re-Evaluation 12/28/21    Authorization Time Period Reporting period starting 09/03/2021    OT Start Time 1300    OT Stop Time 1345    OT Time Calculation (min) 45 min    Activity Tolerance Patient tolerated treatment well    Behavior During Therapy WFL for tasks assessed/performed             Past Medical History:  Diagnosis Date   Anemia    Cough    RESOLVING, NO FEVER   Diabetes mellitus without complication ([place])    Gout    Hypertension    Hypothyroidism    PUD (peptic ulcer disease)    Stroke [facility])    Wears dentures    full upper, partial lower   Past Surgical History:  Procedure Laterality Date   AMPUTATION TOE Left 12/14/2017   Procedure: AMPUTATION TOE-4TH MPJ;  Surgeon: Samara Deist, DPM;  Location: [place];  Service: Podiatry;  Laterality: Left;  IVA LOCAL Diabetic - oral meds   APPENDECTOMY     CATARACT EXTRACTION W/PHACO Right 06/23/2015   Procedure: CATARACT EXTRACTION PHACO AND INTRAOCULAR LENS PLACEMENT (IOC);  Surgeon: Estill Cotta, MD;  Location: [facility];  Service: Ophthalmology;  Laterality: Right;  [place] 01:28 AP% 23.4 CDE 36.14 fluid pack lot # [id number]   CATARACT EXTRACTION W/PHACO Left 07/28/2015   Procedure: CATARACT EXTRACTION PHACO AND INTRAOCULAR LENS PLACEMENT (IOC);  Surgeon: Estill Cotta, MD;  Location: [facility];  Service: Ophthalmology;  Laterality: Left;  [place]    1:29.7 AP%  24.9 CDE   41.32 fluid casette lot #[id number] H exp 09/23/2016   COONOSCOPY     AND ENDOSCOPY   DILATION AND CURETTAGE OF UTERUS     ENDARTERECTOMY Left 11/13/2020   Procedure: Evacuation of left neck hematoma;  Surgeon: Katha Cabal, MD;  Location:  [facility];  Service: Vascular;  Laterality: Left;   ENDARTERECTOMY Left 11/13/2020   Procedure: ENDARTERECTOMY CAROTID;  Surgeon: Algernon Huxley, MD;  Location: [facility];  Service: Vascular;  Laterality: Left;   TONSILLECTOMY     TUBAL LIGATION     Patient Active Problem List   Diagnosis Date Noted   Carotid stenosis, symptomatic, with infarction ([place]) 11/13/2020   Gout flare 11/10/2020   UTI (urinary tract infection) 11/10/2020   Left carotid artery stenosis 11/10/2020   Chronic kidney disease 11/10/2020   Left basal ganglia embolic stroke ([place]) 69/62/9528   PAC (premature atrial contraction)    Pre-procedural cardiovascular examination    Ischemic stroke ([place]) 10/20/2020   TIA (transient ischemic attack) 10/19/2020   Right hemiparesis ([place]) 10/19/2020   Type 2 diabetes mellitus with stage 3 chronic kidney disease, without long-term current use of insulin ([place]) 08/24/2018   Hyperlipidemia, unspecified 07/19/2017   Hypertension 07/19/2017   PUD (peptic ulcer disease) 07/19/2017   Type 2 diabetes mellitus ([place]) 07/19/2017   Personal history of gout 08/12/2016   Anemia, unspecified 04/09/2016   Hypothyroidism, acquired 12/10/2015   REFERRING DIAG: CVA  THERAPY DIAG:  Other lack of coordination  Rationale for Evaluation and Treatment Rehabilitation  PERTINENT HISTORY:   R ankle pain, gout, T2DM, HTN, CKDIII; R/L carotid endarectomy  PRECAUTIONS:  None  SUBJECTIVE: Pt. Reports doing well today  PAIN:  Are you having pain? No     OBJECTIVE:   TODAY'S TREATMENT:     Pt. worked on grasping 1" resistive cubes alternating thumb opposition to the tip of the 5th digit while the board is placed at a vertical angle. Pt. worked on pressing the cubes back into place while alternating isolated 2nd, and 3rd digit extension.  Pt. Worked on grasping 1/2" circular tipped pegs, storing them in the palm of the hand, and then dropping the pegs one at a time from the palm of her hand.   Pt.  is progressing with RUE strength. Pt. presents with limited right hand [facility] skills, presenting with difficulty performing translatory movements with the right hand. Pt. was able to grasp, and store the pegs in the palm of her hand, however, occasionally drops items when attempting to perform  translatory movements moving them through her palm, as well as controlled dropping them from her palm one at a time. Pt. Continues to work on improving  RUE [place], and hand  functioning in order to work towards improving ,a nd maximizing independence with ADLs, and IADLs      PATIENT EDUCATION: Education details: Right hand motor control, and coordination skills. Person educated: Patient Education method: Explanation, Demonstration, Tactile cues, and Handouts Education comprehension: verbalized understanding, returned demonstration, verbal cues required, and needs further education   HOME EXERCISE PROGRAM  Pt. to continue with current HEPs with ongoing updates as indicated   OT Short Term Goals - 03/23/21 1657       OT SHORT TERM GOAL #1   Title Pt will perform HEP for RUE strength/coordination independently.    Baseline Eval: HEP not yet established in outpatient, but pt is working with putty from [name]; 12/04/2020; [facility] HEP initiated this day with mod vc after return demo; 01/05/2021: indep with currently program, but ongoing as pt progresses; 01/28/2021: vc to revisit and focus on grip and pinch strengthening with putty; 03/23/21: pt is indep with HEP    Time 6    Period Weeks    Status Achieved    Target Date 03/23/21              OT Long Term Goals - 10/15/21 1322       OT LONG TERM GOAL #1   Title Pt will improve hand writing to 100% legibility with R hand to be able to independently write a check.    Baseline Eval: signature is 75% legible with R hand, requiring extra time; 12/04/2020: no chan to complete.ge from eval; 01/05/2021: Printing is 100% legible, signature is 90% legible, but still  requires extra time and effort.; 01/26/21: Signature is 90% legible and speed/fluidity have improved. 20th visit: 90% legible, 03/23/21: 90% legible, extra time 40th visit 90 % legibility with increased time, 50th visit: 90% legible with with increased time required 07/14/2021: 90% legibility. 07/30/2021: 90% legibility, pt. presses too hard through the pen, and pencil. 09/03/2021: 90% legibility; 10/06/21: Increased time, but pt states she signs cards and writes short notes on a card sufficiently.  Pt states she has no need to write checks anymore as her family assists with this and she has no desire to return to this, but she would like to work on her pressure modulation.  New goal below. 10/15/2021: Pt. conitnues to require increased time to complete note cards, and corespondence.    Time 12    Period Weeks    Status  Partially Met    Target Date 12/28/21      OT LONG TERM GOAL #2   Title Pt will improve R hand coordination to enable good manipulation of iphone using R dominant hand      OT LONG TERM GOAL #3   Title Pt will improve GMC throughout RUE to enable pt to reach for and pick up ADL supplies with R dominant hand without dropping or knocking over objects.    Baseline Eval: Pt reports RUE is clumsy and easily knocks over objects when trying to reach for ADL supplies.  Pt verbalizes that her "aim is off."; 12/04/2020: pt reports slight improvement since eval and has started using her R hand to eat, but still feels clumsy; 01/05/2021: Greatly improved; pt is using silverware to eat with R hand, can comb hair with RUE, still mildly ataxic; 01/26/21: pt reports difficulty picking up objects within a small space (ie; may knock the toothpaste holder down when reaching for toothbrush), but reaching for light/larger objects is improving 20th visit: pt. continues to be mildly ataxic, however is consistently improving with motor control, and [place] while reaching targets; 03/23/21: Pt reaches with accuracy towards  targets if she takes her time (pt drops and knocks things over when moving at a faster/normal pace) 04/23/2021: Pt. continues to present with difficulty with depth perception, and impaired [place].40th visit: Pt. is improving, however tends to knock over lighter weight objects.50th visit: Pt. has improved with reaching for items without knocking them over, however continues to need work on accuracy.07/14/2021: Pt. is improving while motor control, and accuracy of reaching for objects. Pt. continues to work towards improving efficiency with reaching for objects. 07/30/2021: Pt. is able to able to reach for objects more accurately, however continues to present with limited motor control in the Right. 09/03/2021: Pt. Pt. is improving with reaching up without knocking items over. pt. is now able to stand at the sink and reach up without dropping items; 10/06/21: pt estimates she reaches with 85-90% accurate with RUE, occasional dropping. 10/15/2021: Pt. reports 85-90% accureacy, however presents with decreased accuracy the higher, or lower she reaches.    Time 12    Period Weeks    Status On-going    Target Date 12/28/21      OT LONG TERM GOAL #4   Title Pt will improve FOTO score to 68 or better to indicate measurable functional improvement.    Baseline Eval: FOTO 55; 01/05/2021: FOTO 61; 01/28/21: FOTO 61, 20th visit: 61; 03/23/21: FOTO 60 4oth visit: FOTO score: 61, 50th visit: FOTO 69 07/30/2021: FOTO 71  09/03/2021: FOTO 73; 10/06/21: 73 10/15/2021: 77    Time 12    Period Weeks    Status On-going      OT LONG TERM GOAL #6   Title Pt. will improve right hand [facility] skills to be able to indepedendently manipulate and set-up her medication/pills in the pillbox.    Baseline 07/14/2021: Pt. has difficulty manipulating small pills, and often drops them. 07/30/2021: Pt. continues to have difficulty grasping pills/medication for set-up of pillbox. 09/03/2021: Pt. is able to grasp pills from the pillbox without dropping them;  10/06/21: modified indep (with extra time to complete). 10/15/2021: Pt. reports requiring increased time to complete.    Time 12    Period Weeks    Status Partially Met    Target Date 12/28/21      OT LONG TERM GOAL #7   Title Pt will improve accuracy with graded force  through RUE during functional tasks per self report.    Baseline 10/06/21: Pt reports she holds her greatgrandchild too tightly, scratches her arm too forcefully, and sometimes writes with too much pressure; pt struggles to grade force for functional activities.10/15/2021: Pt. continues to grasp items with the left hand too tightly.    Time 12    Period Weeks    Status Achieved              Plan - 10/22/21 1311     Clinical Impression Statement Pt. is progressing with RUE strength. Pt. presents with limited right hand [facility] skills, presenting with difficulty performing translatory movements with the right hand. Pt. was able to grasp, and store the pegs in the palm of her hand, however, occasionally drops items when attempting to perform  translatory movements moving them through her palm, as well as controlled dropping them from her palm one at a time. Pt. Continues to work on improving  RUE [place], and hand  functioning in order to work towards improving ,a nd maximizing independence with ADLs, and IADLs       OT Occupational Profile and History Detailed Assessment- Review of Records and additional review of physical, cognitive, psychosocial history related to current functional performance    Occupational performance deficits (Please refer to evaluation for details): ADL's;Leisure;IADL's    Body Structure / Function / Physical Skills ADL;Coordination;Endurance;GMC;UE functional use;Balance;Sensation;Body mechanics;IADL;Pain;Dexterity;FMC;Strength;Gait;Mobility    Rehab Potential Good    Clinical Decision Making Several treatment options, min-mod task modification necessary    Comorbidities Affecting Occupational Performance: May  have comorbidities impacting occupational performance    Modification or Assistance to Complete Evaluation  Min-Moderate modification of tasks or assist with assess necessary to complete eval    OT Frequency 2x / week    OT Duration 12 weeks    OT Treatment/Interventions Self-care/ADL training;Therapeutic exercise;DME and/or AE instruction;Functional Mobility Training;Balance training;Neuromuscular education;Therapeutic activities;Patient/family education    Family Member Consulted daughter, Satira Mccallum, MS, OTR/L  Harrel Carina, OT 11/05/2021, 1:23 PM

## 2021-11-10 ENCOUNTER — Ambulatory Visit: Payer: Medicare HMO | Admitting: Physical Therapy

## 2021-11-10 ENCOUNTER — Ambulatory Visit: Payer: Medicare HMO | Admitting: Occupational Therapy

## 2021-11-10 ENCOUNTER — Encounter: Payer: Self-pay | Admitting: Physical Therapy

## 2021-11-10 DIAGNOSIS — M6281 Muscle weakness (generalized): Secondary | ICD-10-CM

## 2021-11-10 DIAGNOSIS — R278 Other lack of coordination: Secondary | ICD-10-CM

## 2021-11-10 DIAGNOSIS — R2681 Unsteadiness on feet: Secondary | ICD-10-CM

## 2021-11-10 DIAGNOSIS — R269 Unspecified abnormalities of gait and mobility: Secondary | ICD-10-CM

## 2021-11-10 DIAGNOSIS — R2689 Other abnormalities of gait and mobility: Secondary | ICD-10-CM

## 2021-11-10 DIAGNOSIS — R482 Apraxia: Secondary | ICD-10-CM

## 2021-11-10 DIAGNOSIS — R262 Difficulty in walking, not elsewhere classified: Secondary | ICD-10-CM

## 2021-11-10 NOTE — Therapy (Addendum)
OUTPATIENT OCCUPATIONAL THERAPY TREATMENT NOTE   Patient Name: Cassandra Schaefer MRN: [id number] DOB:03-23-1929, 88 y.o., female Today's Date: 11/10/2021   REFERRING PROVIDER: Tracie Harrier, MD   OT End of Session - 11/10/21 1307     Visit Number 63    Number of Visits 114    Date for OT Re-Evaluation 12/28/21    Authorization Time Period Reporting period starting 09/03/2021    OT Start Time 1300    OT Stop Time 1345    OT Time Calculation (min) 45 min    Activity Tolerance Patient tolerated treatment well    Behavior During Therapy WFL for tasks assessed/performed             Past Medical History:  Diagnosis Date   Anemia    Cough    RESOLVING, NO FEVER   Diabetes mellitus without complication ([place])    Gout    Hypertension    Hypothyroidism    PUD (peptic ulcer disease)    Stroke [facility])    Wears dentures    full upper, partial lower   Past Surgical History:  Procedure Laterality Date   AMPUTATION TOE Left 12/14/2017   Procedure: AMPUTATION TOE-4TH MPJ;  Surgeon: Samara Deist, DPM;  Location: [place];  Service: Podiatry;  Laterality: Left;  IVA LOCAL Diabetic - oral meds   APPENDECTOMY     CATARACT EXTRACTION W/PHACO Right 06/23/2015   Procedure: CATARACT EXTRACTION PHACO AND INTRAOCULAR LENS PLACEMENT (IOC);  Surgeon: Estill Cotta, MD;  Location: [facility];  Service: Ophthalmology;  Laterality: Right;  [place] 01:28 AP% 23.4 CDE 36.14 fluid pack lot # [id number]   CATARACT EXTRACTION W/PHACO Left 07/28/2015   Procedure: CATARACT EXTRACTION PHACO AND INTRAOCULAR LENS PLACEMENT (IOC);  Surgeon: Estill Cotta, MD;  Location: [facility];  Service: Ophthalmology;  Laterality: Left;  [place]    1:29.7 AP%  24.9 CDE   41.32 fluid casette lot #[id number] H exp 09/23/2016   COONOSCOPY     AND ENDOSCOPY   DILATION AND CURETTAGE OF UTERUS     ENDARTERECTOMY Left 11/13/2020   Procedure: Evacuation of left neck hematoma;  Surgeon: Katha Cabal, MD;  Location:  [facility];  Service: Vascular;  Laterality: Left;   ENDARTERECTOMY Left 11/13/2020   Procedure: ENDARTERECTOMY CAROTID;  Surgeon: Algernon Huxley, MD;  Location: [facility];  Service: Vascular;  Laterality: Left;   TONSILLECTOMY     TUBAL LIGATION     Patient Active Problem List   Diagnosis Date Noted   Carotid stenosis, symptomatic, with infarction ([place]) 11/13/2020   Gout flare 11/10/2020   UTI (urinary tract infection) 11/10/2020   Left carotid artery stenosis 11/10/2020   Chronic kidney disease 11/10/2020   Left basal ganglia embolic stroke ([place]) 95/63/8756   PAC (premature atrial contraction)    Pre-procedural cardiovascular examination    Ischemic stroke ([place]) 10/20/2020   TIA (transient ischemic attack) 10/19/2020   Right hemiparesis ([place]) 10/19/2020   Type 2 diabetes mellitus with stage 3 chronic kidney disease, without long-term current use of insulin ([place]) 08/24/2018   Hyperlipidemia, unspecified 07/19/2017   Hypertension 07/19/2017   PUD (peptic ulcer disease) 07/19/2017   Type 2 diabetes mellitus ([place]) 07/19/2017   Personal history of gout 08/12/2016   Anemia, unspecified 04/09/2016   Hypothyroidism, acquired 12/10/2015   REFERRING DIAG: CVA  THERAPY DIAG:  Muscle weakness (generalized)  Other lack of coordination  Rationale for Evaluation and Treatment Rehabilitation  PERTINENT HISTORY:   R ankle pain, gout, T2DM, HTN, CKDIII; R/L  carotid endarectomy  PRECAUTIONS: None  SUBJECTIVE: Pt. Reports doing well today  PAIN:  Are you having pain? No     OBJECTIVE:   TODAY'S TREATMENT:   Pt. worked on translatory movements grasping 1/2" flat circular [place] marbles with her 2nd digit, and thumb, and moving them to her palm for storage and then moving them from her palm to the tip of her 2nd digit, and thumb. Pt. also worked on controlled release of them one at a time from her palm.  Pt. is progressing with RUE strength. Pt. presents with limited right hand [facility]  skills, and continues to present with difficulty performing translatory movements with the right hand. Pt. was able to grasp, and store the 1/2" circular pegs in the palm of her hand, however, drops multiple items when attempting to perform  translatory movements moving them through her palm, as well as controlled dropping them from her palm one at a time. Pt. Continues to work on improving  RUE [place], and hand  functioning in order to work towards improving, and maximizing independence with ADLs, and IADLs      PATIENT EDUCATION: Education details: Right hand motor control, and coordination skills. Person educated: Patient Education method: Explanation, Demonstration, Tactile cues, and Handouts Education comprehension: verbalized understanding, returned demonstration, verbal cues required, and needs further education   HOME EXERCISE PROGRAM  Pt. to continue with current HEPs with ongoing updates as indicated   OT Short Term Goals - 03/23/21 1657       OT SHORT TERM GOAL #1   Title Pt will perform HEP for RUE strength/coordination independently.    Baseline Eval: HEP not yet established in outpatient, but pt is working with putty from [name]; 12/04/2020; [facility] HEP initiated this day with mod vc after return demo; 01/05/2021: indep with currently program, but ongoing as pt progresses; 01/28/2021: vc to revisit and focus on grip and pinch strengthening with putty; 03/23/21: pt is indep with HEP    Time 6    Period Weeks    Status Achieved    Target Date 03/23/21              OT Long Term Goals - 10/15/21 1322       OT LONG TERM GOAL #1   Title Pt will improve hand writing to 100% legibility with R hand to be able to independently write a check.    Baseline Eval: signature is 75% legible with R hand, requiring extra time; 12/04/2020: no chan to complete.ge from eval; 01/05/2021: Printing is 100% legible, signature is 90% legible, but still requires extra time and effort.; 01/26/21: Signature is  90% legible and speed/fluidity have improved. 20th visit: 90% legible, 03/23/21: 90% legible, extra time 40th visit 90 % legibility with increased time, 50th visit: 90% legible with with increased time required 07/14/2021: 90% legibility. 07/30/2021: 90% legibility, pt. presses too hard through the pen, and pencil. 09/03/2021: 90% legibility; 10/06/21: Increased time, but pt states she signs cards and writes short notes on a card sufficiently.  Pt states she has no need to write checks anymore as her family assists with this and she has no desire to return to this, but she would like to work on her pressure modulation.  New goal below. 10/15/2021: Pt. conitnues to require increased time to complete note cards, and corespondence.    Time 12    Period Weeks    Status Partially Met    Target Date 12/28/21      OT  LONG TERM GOAL #2   Title Pt will improve R hand coordination to enable good manipulation of iphone using R dominant hand      OT LONG TERM GOAL #3   Title Pt will improve GMC throughout RUE to enable pt to reach for and pick up ADL supplies with R dominant hand without dropping or knocking over objects.    Baseline Eval: Pt reports RUE is clumsy and easily knocks over objects when trying to reach for ADL supplies.  Pt verbalizes that her "aim is off."; 12/04/2020: pt reports slight improvement since eval and has started using her R hand to eat, but still feels clumsy; 01/05/2021: Greatly improved; pt is using silverware to eat with R hand, can comb hair with RUE, still mildly ataxic; 01/26/21: pt reports difficulty picking up objects within a small space (ie; may knock the toothpaste holder down when reaching for toothbrush), but reaching for light/larger objects is improving 20th visit: pt. continues to be mildly ataxic, however is consistently improving with motor control, and [place] while reaching targets; 03/23/21: Pt reaches with accuracy towards targets if she takes her time (pt drops and knocks things  over when moving at a faster/normal pace) 04/23/2021: Pt. continues to present with difficulty with depth perception, and impaired [place].40th visit: Pt. is improving, however tends to knock over lighter weight objects.50th visit: Pt. has improved with reaching for items without knocking them over, however continues to need work on accuracy.07/14/2021: Pt. is improving while motor control, and accuracy of reaching for objects. Pt. continues to work towards improving efficiency with reaching for objects. 07/30/2021: Pt. is able to able to reach for objects more accurately, however continues to present with limited motor control in the Right. 09/03/2021: Pt. Pt. is improving with reaching up without knocking items over. pt. is now able to stand at the sink and reach up without dropping items; 10/06/21: pt estimates she reaches with 85-90% accurate with RUE, occasional dropping. 10/15/2021: Pt. reports 85-90% accureacy, however presents with decreased accuracy the higher, or lower she reaches.    Time 12    Period Weeks    Status On-going    Target Date 12/28/21      OT LONG TERM GOAL #4   Title Pt will improve FOTO score to 68 or better to indicate measurable functional improvement.    Baseline Eval: FOTO 55; 01/05/2021: FOTO 61; 01/28/21: FOTO 61, 20th visit: 61; 03/23/21: FOTO 60 4oth visit: FOTO score: 61, 50th visit: FOTO 69 07/30/2021: FOTO 71  09/03/2021: FOTO 73; 10/06/21: 73 10/15/2021: 77    Time 12    Period Weeks    Status On-going      OT LONG TERM GOAL #6   Title Pt. will improve right hand [facility] skills to be able to indepedendently manipulate and set-up her medication/pills in the pillbox.    Baseline 07/14/2021: Pt. has difficulty manipulating small pills, and often drops them. 07/30/2021: Pt. continues to have difficulty grasping pills/medication for set-up of pillbox. 09/03/2021: Pt. is able to grasp pills from the pillbox without dropping them; 10/06/21: modified indep (with extra time to complete).  10/15/2021: Pt. reports requiring increased time to complete.    Time 12    Period Weeks    Status Partially Met    Target Date 12/28/21      OT LONG TERM GOAL #7   Title Pt will improve accuracy with graded force through RUE during functional tasks per self report.    Baseline 10/06/21: Pt  reports she holds her greatgrandchild too tightly, scratches her arm too forcefully, and sometimes writes with too much pressure; pt struggles to grade force for functional activities.10/15/2021: Pt. continues to grasp items with the left hand too tightly.    Time 12    Period Weeks    Status Achieved              Plan - 10/22/21 1311     Clinical Impression Statement Pt. is progressing with RUE strength. Pt. presents with limited right hand [facility] skills, and continues to present with difficulty performing translatory movements with the right hand. Pt. was able to grasp, and store the 1/2" circular pegs in the palm of her hand, however, drops multiple items when attempting to perform  translatory movements moving them through her palm, as well as controlled dropping them from her palm one at a time. Pt. Continues to work on improving  RUE [place], and hand  functioning in order to work towards improving, and maximizing independence with ADLs, and IADLs       OT Occupational Profile and History Detailed Assessment- Review of Records and additional review of physical, cognitive, psychosocial history related to current functional performance    Occupational performance deficits (Please refer to evaluation for details): ADL's;Leisure;IADL's    Body Structure / Function / Physical Skills ADL;Coordination;Endurance;GMC;UE functional use;Balance;Sensation;Body mechanics;IADL;Pain;Dexterity;FMC;Strength;Gait;Mobility    Rehab Potential Good    Clinical Decision Making Several treatment options, min-mod task modification necessary    Comorbidities Affecting Occupational Performance: May have comorbidities impacting  occupational performance    Modification or Assistance to Complete Evaluation  Min-Moderate modification of tasks or assist with assess necessary to complete eval    OT Frequency 2x / week    OT Duration 12 weeks    OT Treatment/Interventions Self-care/ADL training;Therapeutic exercise;DME and/or AE instruction;Functional Mobility Training;Balance training;Neuromuscular education;Therapeutic activities;Patient/family education    Family Member Consulted daughter, Emi Holes [place], MS, OTR/L  Harrel Carina, OT 11/10/2021, 1:15 PM

## 2021-11-10 NOTE — Therapy (Signed)
OUTPATIENT PHYSICAL THERAPY TREATMENT NOTE    Patient Name: Cassandra Schaefer MRN: [id number] DOB:[birth year], 88 y.o., female Today's Date: 11/11/2021  PCP: Roetta Sessions MD REFERRING PROVIDER: Roetta Sessions MD   PT End of Session - 11/10/21 1300     Visit Number 22    Number of Visits 102    Date for PT Re-Evaluation 12/17/21    Authorization Type [employer]    Authorization Time Period 10/22/21-12/17/21    Progress Note Due on Visit 90    PT Start Time 1348    PT Stop Time 1430    PT Time Calculation (min) 42 min    Equipment Utilized During Treatment Gait belt    Activity Tolerance Patient tolerated treatment well;No increased pain    Behavior During Therapy WFL for tasks assessed/performed              Past Medical History:  Diagnosis Date   Anemia    Cough    RESOLVING, NO FEVER   Diabetes mellitus without complication ([place])    Gout    Hypertension    Hypothyroidism    PUD (peptic ulcer disease)    Stroke [facility])    Wears dentures    full upper, partial lower   Past Surgical History:  Procedure Laterality Date   AMPUTATION TOE Left 12/14/2017   Procedure: AMPUTATION TOE-4TH MPJ;  Surgeon: Samara Deist, DPM;  Location: [facility];  Service: Podiatry;  Laterality: Left;  IVA LOCAL Diabetic - oral meds   APPENDECTOMY     CATARACT EXTRACTION W/PHACO Right 06/23/2015   Procedure: CATARACT EXTRACTION PHACO AND INTRAOCULAR LENS PLACEMENT (IOC);  Surgeon: Estill Cotta, MD;  Location: [facility];  Service: Ophthalmology;  Laterality: Right;  [place] 01:28 AP% 23.4 CDE 36.14 fluid pack lot # [id number]   CATARACT EXTRACTION W/PHACO Left 07/28/2015   Procedure: CATARACT EXTRACTION PHACO AND INTRAOCULAR LENS PLACEMENT (IOC);  Surgeon: Estill Cotta, MD;  Location: [facility];  Service: Ophthalmology;  Laterality: Left;  [place]    1:29.7 AP%  24.9 CDE   41.32 fluid casette lot #[id number] H exp 09/23/2016   COONOSCOPY     AND ENDOSCOPY   DILATION AND CURETTAGE OF UTERUS      ENDARTERECTOMY Left 11/13/2020   Procedure: Evacuation of left neck hematoma;  Surgeon: Katha Cabal, MD;  Location: [facility];  Service: Vascular;  Laterality: Left;   ENDARTERECTOMY Left 11/13/2020   Procedure: ENDARTERECTOMY CAROTID;  Surgeon: Algernon Huxley, MD;  Location: [facility];  Service: Vascular;  Laterality: Left;   TONSILLECTOMY     TUBAL LIGATION     Patient Active Problem List   Diagnosis Date Noted   Carotid stenosis, symptomatic, with infarction ([place]) 11/13/2020   Gout flare 11/10/2020   UTI (urinary tract infection) 11/10/2020   Left carotid artery stenosis 11/10/2020   Chronic kidney disease 11/10/2020   Left basal ganglia embolic stroke ([place]) 76/22/6333   PAC (premature atrial contraction)    Pre-procedural cardiovascular examination    Ischemic stroke ([place]) 10/20/2020   TIA (transient ischemic attack) 10/19/2020   Right hemiparesis ([place]) 10/19/2020   Type 2 diabetes mellitus with stage 3 chronic kidney disease, without long-term current use of insulin ([place]) 08/24/2018   Hyperlipidemia, unspecified 07/19/2017   Hypertension 07/19/2017   PUD (peptic ulcer disease) 07/19/2017   Type 2 diabetes mellitus ([place]) 07/19/2017   Personal history of gout 08/12/2016   Anemia, unspecified 04/09/2016   Hypothyroidism, acquired 12/10/2015    REFERRING DIAG: CVA with right  hemiparesis  THERAPY DIAG:  Muscle weakness (generalized)  Unsteadiness on feet  Other abnormalities of gait and mobility  Abnormality of gait and mobility  Other lack of coordination  Apraxia  Difficulty in walking, not elsewhere classified  Rationale for Evaluation and Treatment Rehabilitation  PERTINENT HISTORY: 88 y.o. female with medical history significant for type 2 diabetes mellitus, essential hypertension, acquired hypothyroidism, hyperlipidemia, stage III chronic kidney disease reports increased right sided weakness on 10/19/20. She was diagnosed with left basal ganglia ischemic  stroke. She was discharged to inpatient rehab for 10 days and then discharged home. Patient was evaluated by outpatient PT on 11/12/20. CVA was due to carotid stenosis. She underwent left carotid endartectomy on 7/21. Procedure went well however patient suffered from left cervical hematoma and had subsequent surgical procedure to stop the bleed and remove the hematoma on 11/13/20. They were concerned with her breathing and therefore intubated her for 1 day, she was in ICU for 2 days and transferred to med/surge on 11/15/20. Patient was discharged home on 11/17/20 and is now returning to outpatient PT. She has had the stiches removed and is healing well. Denies any neck pain or stiffness. She lives with her daughter and has 24/7 caregiver support. She is still using RW for most ambulation. She is able to transfer to bedside commode well and is able to stand short time unsupported. She reports feeling very weak and fatigued. She reports she has been dragging her right foot more. She denies any numbness/tingling; She reports feeling like her legs are wanting to do more but is hesitant because of fear of falling. She is mod I for self care morning routine. She does still receive help with showering. She has been working on increasing her activity but denies any formal exercise.   PRECAUTIONS: Fall risk  SUBJECTIVE: Pt reports doing well with no significant changes since last session. She reports getting in her pool and doing pool exercises at home and that has worked well;   PAIN:  Are you having pain? No   TODAY'S TREATMENT:   BP at start of session: 171/59, HR64  There Ex:  Side stepping in parallel bars: Red tband around BLE x2 laps each direction;   Standing on 1/2 bolster (round side up): -heel raises x15 reps -toe raises x15 reps; Required intermittent rail assist for safety;   Gait around gym x180 feet with tripod base cane, CGA with reciprocal gait pattern, intermittent RLE foot drag, improved  gait pattern in hallway with less object navigation and walking in straight line.   BP after exercise/gait:  182/55, HR 65   NMR:   Standing on rocker board   Toes up (incline stance) 30 sec hold x2 sets  Intermittent min A for balance recovery with occasional posterior loss of balance;  Step over forward/backward 1/2 foam roller without  rail support x 10 reps  Airex beam: -tandem stance 5 sec hold x4 reps each foot in front without rail assist, min A for safety; -feet apart- cone pass side/side x5 reps each UE   Ambulating with tripod based cane weaving through cones x 6 cones approximately 2-3 feet apart x 2 repetitions through. No errors, slow gait pattern and inconsistent use of cane with proper sequence.   Pt educated throughout session about proper posture and technique with exercises. Improved exercise technique, movement at target joints, use of target muscles after min to mod verbal, visual, tactile cues.    Pt required occasional rest breaks due fatigue  and likely increase in blood pressure, PT was quick to ask when pt appeared to be fatiguing in order to prevent excessive fatigue.     PATIENT EDUCATION: Education details: Exercise technique/positioning; HEP Person educated: Patient Education method: Explanation, Demonstration, and Verbal cues Education comprehension: verbalized understanding, returned demonstration, and needs further education   HOME EXERCISE PROGRAM: No changes this session;   Instructed patient in pool exercise: Access Code: [email or web address] URL: [email or web address] Date: 10/22/2021 Prepared by: Blanche East  Exercises - Seated Knee Extension with Resistance  - 1 x daily - 7 x weekly - 2 sets - 15 reps - Forward and Backward Stepping at [employer]  - 1 x daily - 7 x weekly - 1 sets - 5 reps - Freestyle Swimming with Snorkel Mask  - 1 x daily - 7 x weekly - 1 sets - 2-3 reps - Heel Walking  - 1 x daily - 7 x weekly - 1 sets - 5  reps - Toe Walking  - 1 x daily - 7 x weekly - 1 sets - 5 reps - Single Leg Stance at Pool Wall  - 1 x daily - 7 x weekly - 1 sets - 3-5 reps - 15-30 sec hold   PT Short Term Goals -       PT SHORT TERM GOAL #1   Title Patient will be adherent to HEP at least 3x a week to improve functional strength and balance for better safety at home.    Baseline 9/21: Pt reports compliance with HEP and is confident, 1/3: doing exercise 2x a week; 06/25/21: 3x/week, 5/18: consistent;    Time 4    Period Weeks    Status Achieved    Target Date 06/25/21      PT SHORT TERM GOAL #2   Title Patient will be independent in transferring sit<>Stand without pushing on arm rests to improve ability to get up from chair.    Baseline 9/21: pt able to complete STS without use of UEs, 1/3:able to stand but has moderate difficulty requiring increased time; 06/25/21: able to perform indep without hands but requiring use of momentum to attain standing. Remains stable. 5/18: able to stand without UE assist, close supervision, remains stable;    Time 4    Period Weeks    Status Achieved    Target Date 07/23/21              PT Long Term Goals -      PT LONG TERM GOAL #1   Title Patient (> 62 years old) will complete five times sit to stand test in < 15 seconds indicating an increased LE strength and improved balance.    Baseline 9/21: 29 seconds hands-free 10/10: deferred 10/12: 34 sec hands free; 11/2: 23.8 sec hands-free; 12/7: 33.2 sec hands free, 1/3: 33.92 hands free; 06/25/21: 25.69 sec, 4/13: 15.5 sec hands free, 5/18: 23 sec without UE 6/22: deferred   Time 8    Period Weeks    Status Partially Met    Target Date  12/17/21     PT LONG TERM GOAL #2   Title Patient will increase six minute walk test distance to >400 feet for improve gait ability    Baseline 9/21: 368 ft with RW; 10/10: deferred 10/12: 408 ft; 11/2: 476 ft with 4WW, 1/3: 475 feet with RW; 4/13: 450 feet without AD CGA 10/15/21: 460 feet without  AD, CGA   Time 8    Period Weeks  Status Achieved    Target Date 12/17/21     PT LONG TERM GOAL #3   Title Patient will increase 10 meter walk test to >1.78ms as to improve gait speed for better community ambulation and to reduce fall risk.    Baseline 9/21: 0.5 m/s with RW; 10/10 deferred 10/12: 0.93 m/s with 41WE 11/2: 0.83 m/s with 43XV 12/7: 0.52 m/s with RW, 1/3: 0.704; 06/25/21: 0.55 m/s with SPC. 5/18: 0.4 m/s without AD , 10/15/21 0.507 m/s without AD   Time 8    Period Weeks    Status Partially Met   Target Date 12/17/21     PT LONG TERM GOAL #4   Title Patient will be independent with ascend/descend 6 steps using single UE in step over step pattern without LOB.    Baseline 9/21:  Patient uses PT stairs (4 steps) with bilateral upper extremity support on handrails for both ascending/descending.  Patient exhibits reciprocal pattern ascending and step to pattern descending. 10/10: deferred; 10/12: ascending/descending recip. steps with BUE support; 11/2: Ascended/descended 8 steps total, reciprocal pattern ascending and step-to descending. Pt uses UUE support throughout with CGA assist.; 12/7: Ascended and descended 8 steps total with UUE support and CGA. Reciprocal stepping ascending and step to descending., 1/3: Deferrred; 06/25/21: able to perform with SUE support and reciprocal pattern asc/desc, but does require close CGA with descending. 5/18: independent with 2 handrails, close supervision with 1 handrai; 10/15/21: requires 2 handrails   Time 8    Period Weeks    Status Partially Met    Target Date 12/17/21     PT LONG TERM GOAL #5   Title Patient will demonstrate an improved Berg Balance Score of >45/56 as to demonstrate improved balance with ADLs such as sitting/standing and transfer balance and reduced fall risk.    Baseline 9/21: deferred d/t time; 9/29: 42/56; 10/10: deferred; 10/12: 44/56; 11/2: deferred; 11/7: 46/56, 1/3: deferred    Time 8    Period Weeks    Status  Achieved    Target Date 12/17/21     PT LONG TERM GOAL #6   Title Patient will improve FOTO score to >60% to indicate improved functional mobility with ADLs.    Baseline 9/21: 59; 10/10: 57; 11/2: 60; 12/7: 58%, 1/3: 61%, 4/13: 56% 10/15/21: 58%   Time 8    Period Weeks    Status Partially Met   Target Date 12/17/21     PT LONG TERM GOAL #7   Title Pt will improve FGA by a minimum of 5 points to indicate clinically significant improvement in reduced risk of falls with walking tasks.    Baseline 06/25/21: 12, 4/13: deferred, 5/18: deferred due to increased ankle discomfort; 6/22: deferred due to elevated BP- will assess next visit; 10/20/21: 8/30   Time 8    Period Weeks    Status Not met   Target Date 12/17/21             Plan -    Clinical Impression Statement Patient motivated and participated well within session. She was instructed in advanced LE strengthening. Patient's vitals monitored as she continues to have elevated BP with exertion. Patient was able to walk with better reciprocal gait pattern with less instability. She continues to ambulate with slower gait speed, however exhibits less foot drag. She was instructed in advanced balance exercise. Patient was able to stand on incline for longer time without loss of balance but does exhibit posterior loss of balance with  prolonged stance. She is challenged with stance on unstable surface with increased ankle instability. She required CGA to min A with advanced exercise. Patient would benefit from additional skilled PT intervention to improve strength, balance and gait safety;     Personal Factors and Comorbidities Age    Comorbidities HTN, CKD, fall risk, type 2 diabetes,    Examination-Activity Limitations Lift;Locomotion Level;Squat;Stairs;Stand;Toileting;Transfers;Bathing    Examination-Participation Restrictions Cleaning;Community Activity;Laundry;Meal Prep;Shop;Volunteer;Driving    Stability/Clinical Decision Making  Stable/Uncomplicated    Rehab Potential Fair    PT Frequency 2x / week    PT Duration 8 weeks    PT Treatment/Interventions Cryotherapy;Electrical Stimulation;Moist Heat;Gait training;Stair training;Functional mobility training;Therapeutic activities;Therapeutic exercise;Neuromuscular re-education;Balance training;Patient/family education;Orthotic Fit/Training;Energy conservation    PT Next Visit Plan work on LE strengthening and balance; gait training, endurance, continue POC as previously indicated    PT Home Exercise Plan No changes this date    Consulted and Agree with Plan of Care Patient                 Aveer Bartow, PT, DPT 11/11/2021, 8:20 AM

## 2021-11-11 ENCOUNTER — Ambulatory Visit (INDEPENDENT_AMBULATORY_CARE_PROVIDER_SITE_OTHER): Payer: Medicare HMO | Admitting: Nurse Practitioner

## 2021-11-11 ENCOUNTER — Encounter (INDEPENDENT_AMBULATORY_CARE_PROVIDER_SITE_OTHER): Payer: Self-pay | Admitting: Nurse Practitioner

## 2021-11-11 ENCOUNTER — Ambulatory Visit (INDEPENDENT_AMBULATORY_CARE_PROVIDER_SITE_OTHER): Payer: Medicare HMO

## 2021-11-11 VITALS — BP 175/68 | HR 63 | Resp 16 | Wt 122.0 lb

## 2021-11-11 DIAGNOSIS — E119 Type 2 diabetes mellitus without complications: Secondary | ICD-10-CM

## 2021-11-11 DIAGNOSIS — I6522 Occlusion and stenosis of left carotid artery: Secondary | ICD-10-CM

## 2021-11-11 DIAGNOSIS — E785 Hyperlipidemia, unspecified: Secondary | ICD-10-CM | POA: Diagnosis not present

## 2021-11-11 DIAGNOSIS — I63239 Cerebral infarction due to unspecified occlusion or stenosis of unspecified carotid arteries: Secondary | ICD-10-CM | POA: Diagnosis not present

## 2021-11-12 ENCOUNTER — Encounter: Payer: Self-pay | Admitting: Occupational Therapy

## 2021-11-12 ENCOUNTER — Ambulatory Visit: Payer: Medicare HMO | Admitting: Physical Therapy

## 2021-11-12 ENCOUNTER — Ambulatory Visit: Payer: Medicare HMO | Admitting: Occupational Therapy

## 2021-11-12 DIAGNOSIS — R2689 Other abnormalities of gait and mobility: Secondary | ICD-10-CM

## 2021-11-12 DIAGNOSIS — R269 Unspecified abnormalities of gait and mobility: Secondary | ICD-10-CM

## 2021-11-12 DIAGNOSIS — M6281 Muscle weakness (generalized): Secondary | ICD-10-CM

## 2021-11-12 DIAGNOSIS — R262 Difficulty in walking, not elsewhere classified: Secondary | ICD-10-CM

## 2021-11-12 DIAGNOSIS — R278 Other lack of coordination: Secondary | ICD-10-CM | POA: Diagnosis not present

## 2021-11-12 DIAGNOSIS — R2681 Unsteadiness on feet: Secondary | ICD-10-CM

## 2021-11-12 DIAGNOSIS — R482 Apraxia: Secondary | ICD-10-CM

## 2021-11-12 NOTE — Therapy (Signed)
OUTPATIENT PHYSICAL THERAPY TREATMENT NOTE    Patient Name: Cassandra Schaefer MRN: [id number] DOB:1928/06/02, 88 y.o., female Today's Date: 11/12/2021  PCP: Roetta Sessions MD REFERRING PROVIDER: Roetta Sessions MD   PT End of Session - 11/12/21 1343     Visit Number 87    Number of Visits 102    Date for PT Re-Evaluation 12/17/21    Authorization Type [employer]    Authorization Time Period 10/22/21-12/17/21    Progress Note Due on Visit 90    PT Start Time 1344    PT Stop Time 1428    PT Time Calculation (min) 44 min    Equipment Utilized During Treatment Gait belt    Activity Tolerance Patient tolerated treatment well;No increased pain    Behavior During Therapy WFL for tasks assessed/performed              Past Medical History:  Diagnosis Date   Anemia    Cough    RESOLVING, NO FEVER   Diabetes mellitus without complication ([place])    Gout    Hypertension    Hypothyroidism    PUD (peptic ulcer disease)    Stroke [facility])    Wears dentures    full upper, partial lower   Past Surgical History:  Procedure Laterality Date   AMPUTATION TOE Left 12/14/2017   Procedure: AMPUTATION TOE-4TH MPJ;  Surgeon: Samara Deist, DPM;  Location: [facility];  Service: Podiatry;  Laterality: Left;  IVA LOCAL Diabetic - oral meds   APPENDECTOMY     CATARACT EXTRACTION W/PHACO Right 06/23/2015   Procedure: CATARACT EXTRACTION PHACO AND INTRAOCULAR LENS PLACEMENT (IOC);  Surgeon: Estill Cotta, MD;  Location: [facility];  Service: Ophthalmology;  Laterality: Right;  [place] 01:28 AP% 23.4 CDE 36.14 fluid pack lot # [id number]   CATARACT EXTRACTION W/PHACO Left 07/28/2015   Procedure: CATARACT EXTRACTION PHACO AND INTRAOCULAR LENS PLACEMENT (IOC);  Surgeon: Estill Cotta, MD;  Location: [facility];  Service: Ophthalmology;  Laterality: Left;  [place]    1:29.7 AP%  24.9 CDE   41.32 fluid casette lot #[id number] H exp 09/23/2016   COONOSCOPY     AND ENDOSCOPY   DILATION AND CURETTAGE OF UTERUS      ENDARTERECTOMY Left 11/13/2020   Procedure: Evacuation of left neck hematoma;  Surgeon: Katha Cabal, MD;  Location: [facility];  Service: Vascular;  Laterality: Left;   ENDARTERECTOMY Left 11/13/2020   Procedure: ENDARTERECTOMY CAROTID;  Surgeon: Algernon Huxley, MD;  Location: [facility];  Service: Vascular;  Laterality: Left;   TONSILLECTOMY     TUBAL LIGATION     Patient Active Problem List   Diagnosis Date Noted   Carotid stenosis, symptomatic, with infarction ([place]) 11/13/2020   Gout flare 11/10/2020   UTI (urinary tract infection) 11/10/2020   Left carotid artery stenosis 11/10/2020   Chronic kidney disease 11/10/2020   Left basal ganglia embolic stroke ([place]) 56/21/3086   PAC (premature atrial contraction)    Pre-procedural cardiovascular examination    Ischemic stroke ([place]) 10/20/2020   TIA (transient ischemic attack) 10/19/2020   Right hemiparesis ([place]) 10/19/2020   Type 2 diabetes mellitus with stage 3 chronic kidney disease, without long-term current use of insulin ([place]) 08/24/2018   Hyperlipidemia, unspecified 07/19/2017   Hypertension 07/19/2017   PUD (peptic ulcer disease) 07/19/2017   Type 2 diabetes mellitus ([place]) 07/19/2017   Personal history of gout 08/12/2016   Anemia, unspecified 04/09/2016   Hypothyroidism, acquired 12/10/2015    REFERRING DIAG: CVA with right  hemiparesis  THERAPY DIAG:  Muscle weakness (generalized)  Other lack of coordination  Unsteadiness on feet  Other abnormalities of gait and mobility  Abnormality of gait and mobility  Apraxia  Difficulty in walking, not elsewhere classified  Rationale for Evaluation and Treatment Rehabilitation  PERTINENT HISTORY: 88 y.o. female with medical history significant for type 2 diabetes mellitus, essential hypertension, acquired hypothyroidism, hyperlipidemia, stage III chronic kidney disease reports increased right sided weakness on 10/19/20. She was diagnosed with left basal ganglia ischemic  stroke. She was discharged to inpatient rehab for 10 days and then discharged home. Patient was evaluated by outpatient PT on 11/12/20. CVA was due to carotid stenosis. She underwent left carotid endartectomy on 7/21. Procedure went well however patient suffered from left cervical hematoma and had subsequent surgical procedure to stop the bleed and remove the hematoma on 11/13/20. They were concerned with her breathing and therefore intubated her for 1 day, she was in ICU for 2 days and transferred to med/surge on 11/15/20. Patient was discharged home on 11/17/20 and is now returning to outpatient PT. She has had the stiches removed and is healing well. Denies any neck pain or stiffness. She lives with her daughter and has 24/7 caregiver support. She is still using RW for most ambulation. She is able to transfer to bedside commode well and is able to stand short time unsupported. She reports feeling very weak and fatigued. She reports she has been dragging her right foot more. She denies any numbness/tingling; She reports feeling like her legs are wanting to do more but is hesitant because of fear of falling. She is mod I for self care morning routine. She does still receive help with showering. She has been working on increasing her activity but denies any formal exercise.   PRECAUTIONS: Fall risk  SUBJECTIVE: Pt reports doing well with no significant changes since last session. She did see her vascular doctor who said that her carotids are doing well- no blockages. She doesn't have to go back for 1 year; She is going to see her PCP first of August;   PAIN:  Are you having pain? No   TODAY'S TREATMENT:   BP at start of session: 163/45, HR63  There Ex:  Forward step ups on 6 inch step with BUE rail assist x10 reps each LE;  Seated with 2# ankle weight: -LAQ with ankle DF x10 reps;  Standing with 2# ankle weight: -alternate march x10 reps each LE -hip abduction SLR 2x10 reps each LE with cues for  proper positioning; -Heel raises 3 sec hold x10 reps, unsupported;    Standing on 1/2 bolster (round side up): -toe raises x15 reps; Required intermittent rail assist for safety;   Gait around gym x180 feet with tripod base cane, while in hallway, looking side/side calling out cards on wall, CGA with reciprocal gait pattern, intermittent RLE foot drag, improved gait pattern when looking straight ahead with improved gait speed; She does exhibit slower gait speed when looking side/side;    NMR:  Airex beam: -modified tandem stance, stepping over orange hurdle, 15 sec hold x2 reps each foot in front;  -Side stepping over orange hurdle x10 reps each direction with 2 HHA   Ambulating with tripod based cane Negotiating obstacles: -stepping over orange hurdle, weaving through cones x 4 cones approximately 2-3 feet apart, stepping up and over 6 inch step (curb)  X2 sets Required CGA for safety, slow speed  Standing on 1/2 bolster (flat side up): -feet  apart: mini squat x10 reps;   Pt educated throughout session about proper posture and technique with exercises. Improved exercise technique, movement at target joints, use of target muscles after min to mod verbal, visual, tactile cues.    Pt required occasional rest breaks due fatigue and likely increase in blood pressure, PT was quick to ask when pt appeared to be fatiguing in order to prevent excessive fatigue.     PATIENT EDUCATION: Education details: Exercise technique/positioning; HEP Person educated: Patient Education method: Explanation, Demonstration, and Verbal cues Education comprehension: verbalized understanding, returned demonstration, and needs further education   HOME EXERCISE PROGRAM: No changes this session;   Instructed patient in pool exercise: Access Code: [email or web address] URL: [email or web address] Date: 10/22/2021 Prepared by: Blanche East  Exercises - Seated Knee Extension with Resistance  - 1 x  daily - 7 x weekly - 2 sets - 15 reps - Forward and Backward Stepping at [employer]  - 1 x daily - 7 x weekly - 1 sets - 5 reps - Freestyle Swimming with Snorkel Mask  - 1 x daily - 7 x weekly - 1 sets - 2-3 reps - Heel Walking  - 1 x daily - 7 x weekly - 1 sets - 5 reps - Toe Walking  - 1 x daily - 7 x weekly - 1 sets - 5 reps - Single Leg Stance at Pool Wall  - 1 x daily - 7 x weekly - 1 sets - 3-5 reps - 15-30 sec hold   PT Short Term Goals -       PT SHORT TERM GOAL #1   Title Patient will be adherent to HEP at least 3x a week to improve functional strength and balance for better safety at home.    Baseline 9/21: Pt reports compliance with HEP and is confident, 1/3: doing exercise 2x a week; 06/25/21: 3x/week, 5/18: consistent;    Time 4    Period Weeks    Status Achieved    Target Date 06/25/21      PT SHORT TERM GOAL #2   Title Patient will be independent in transferring sit<>Stand without pushing on arm rests to improve ability to get up from chair.    Baseline 9/21: pt able to complete STS without use of UEs, 1/3:able to stand but has moderate difficulty requiring increased time; 06/25/21: able to perform indep without hands but requiring use of momentum to attain standing. Remains stable. 5/18: able to stand without UE assist, close supervision, remains stable;    Time 4    Period Weeks    Status Achieved    Target Date 07/23/21              PT Long Term Goals -      PT LONG TERM GOAL #1   Title Patient (> 21 years old) will complete five times sit to stand test in < 15 seconds indicating an increased LE strength and improved balance.    Baseline 9/21: 29 seconds hands-free 10/10: deferred 10/12: 34 sec hands free; 11/2: 23.8 sec hands-free; 12/7: 33.2 sec hands free, 1/3: 33.92 hands free; 06/25/21: 25.69 sec, 4/13: 15.5 sec hands free, 5/18: 23 sec without UE 6/22: deferred   Time 8    Period Weeks    Status Partially Met    Target Date  12/17/21     PT LONG TERM GOAL  #2   Title Patient will increase six minute walk test distance to >400 feet for improve gait ability  Baseline 9/21: 67 ft with RW; 10/10: deferred 10/12: 408 ft; 11/2: 476 ft with 4WW, 1/3: 475 feet with RW; 4/13: 450 feet without AD CGA 10/15/21: 460 feet without AD, CGA   Time 8    Period Weeks    Status Achieved    Target Date 12/17/21     PT LONG TERM GOAL #3   Title Patient will increase 10 meter walk test to >1.50ms as to improve gait speed for better community ambulation and to reduce fall risk.    Baseline 9/21: 0.5 m/s with RW; 10/10 deferred 10/12: 0.93 m/s with 41OX 11/2: 0.83 m/s with 40RU 12/7: 0.52 m/s with RW, 1/3: 0.704; 06/25/21: 0.55 m/s with SPC. 5/18: 0.4 m/s without AD , 10/15/21 0.507 m/s without AD   Time 8    Period Weeks    Status Partially Met   Target Date 12/17/21     PT LONG TERM GOAL #4   Title Patient will be independent with ascend/descend 6 steps using single UE in step over step pattern without LOB.    Baseline 9/21:  Patient uses PT stairs (4 steps) with bilateral upper extremity support on handrails for both ascending/descending.  Patient exhibits reciprocal pattern ascending and step to pattern descending. 10/10: deferred; 10/12: ascending/descending recip. steps with BUE support; 11/2: Ascended/descended 8 steps total, reciprocal pattern ascending and step-to descending. Pt uses UUE support throughout with CGA assist.; 12/7: Ascended and descended 8 steps total with UUE support and CGA. Reciprocal stepping ascending and step to descending., 1/3: Deferrred; 06/25/21: able to perform with SUE support and reciprocal pattern asc/desc, but does require close CGA with descending. 5/18: independent with 2 handrails, close supervision with 1 handrai; 10/15/21: requires 2 handrails   Time 8    Period Weeks    Status Partially Met    Target Date 12/17/21     PT LONG TERM GOAL #5   Title Patient will demonstrate an improved Berg Balance Score of >45/56 as to  demonstrate improved balance with ADLs such as sitting/standing and transfer balance and reduced fall risk.    Baseline 9/21: deferred d/t time; 9/29: 42/56; 10/10: deferred; 10/12: 44/56; 11/2: deferred; 11/7: 46/56, 1/3: deferred    Time 8    Period Weeks    Status Achieved    Target Date 12/17/21     PT LONG TERM GOAL #6   Title Patient will improve FOTO score to >60% to indicate improved functional mobility with ADLs.    Baseline 9/21: 59; 10/10: 57; 11/2: 60; 12/7: 58%, 1/3: 61%, 4/13: 56% 10/15/21: 58%   Time 8    Period Weeks    Status Partially Met   Target Date 12/17/21     PT LONG TERM GOAL #7   Title Pt will improve FGA by a minimum of 5 points to indicate clinically significant improvement in reduced risk of falls with walking tasks.    Baseline 06/25/21: 12, 4/13: deferred, 5/18: deferred due to increased ankle discomfort; 6/22: deferred due to elevated BP- will assess next visit; 10/20/21: 8/30   Time 8    Period Weeks    Status Not met   Target Date 12/17/21             Plan -    Clinical Impression Statement Patient motivated and participated well within session. She was instructed in advanced LE strengthening. Patient's vitals monitored as she continues to have elevated BP with exertion. Patient was able to walk with better reciprocal gait pattern  with less instability. She continues to ambulate with slower gait speed especially when looking side/side or negotiating obstacles. However she exhibits less foot drag overall. She was instructed in advanced balance exercise. She is challenged with stance on unstable surface with increased ankle instability. She required CGA to min A with advanced exercise. Patient would benefit from additional skilled PT intervention to improve strength, balance and gait safety;     Personal Factors and Comorbidities Age    Comorbidities HTN, CKD, fall risk, type 2 diabetes,    Examination-Activity Limitations Lift;Locomotion  Level;Squat;Stairs;Stand;Toileting;Transfers;Bathing    Examination-Participation Restrictions Cleaning;Community Activity;Laundry;Meal Prep;Shop;Volunteer;Driving    Stability/Clinical Decision Making Stable/Uncomplicated    Rehab Potential Fair    PT Frequency 2x / week    PT Duration 8 weeks    PT Treatment/Interventions Cryotherapy;Electrical Stimulation;Moist Heat;Gait training;Stair training;Functional mobility training;Therapeutic activities;Therapeutic exercise;Neuromuscular re-education;Balance training;Patient/family education;Orthotic Fit/Training;Energy conservation    PT Next Visit Plan work on LE strengthening and balance; gait training, endurance, continue POC as previously indicated    PT Home Exercise Plan No changes this date    Consulted and Agree with Plan of Care Patient                 Shiela Bruns, PT, DPT 11/12/2021, 2:31 PM

## 2021-11-12 NOTE — Therapy (Signed)
OUTPATIENT OCCUPATIONAL THERAPY TREATMENT NOTE   Patient Name: Cassandra Schaefer MRN: [id number] DOB:11-29-1928, 88 y.o., female Today's Date: 11/12/2021   REFERRING PROVIDER: Tracie Harrier, MD    OT End of Session - 11/12/21 1321     Visit Number 14    Number of Visits 114    Date for OT Re-Evaluation 12/28/21    OT Start Time 1300    OT Stop Time 1345    OT Time Calculation (min) 45 min    Equipment Utilized During Treatment tranport chair    Activity Tolerance Patient tolerated treatment well    Behavior During Therapy WFL for tasks assessed/performed                    Past Medical History:  Diagnosis Date   Anemia    Cough    RESOLVING, NO FEVER   Diabetes mellitus without complication ([place])    Gout    Hypertension    Hypothyroidism    PUD (peptic ulcer disease)    Stroke [facility])    Wears dentures    full upper, partial lower   Past Surgical History:  Procedure Laterality Date   AMPUTATION TOE Left 12/14/2017   Procedure: AMPUTATION TOE-4TH MPJ;  Surgeon: Samara Deist, DPM;  Location: [facility];  Service: Podiatry;  Laterality: Left;  IVA LOCAL Diabetic - oral meds   APPENDECTOMY     CATARACT EXTRACTION W/PHACO Right 06/23/2015   Procedure: CATARACT EXTRACTION PHACO AND INTRAOCULAR LENS PLACEMENT (IOC);  Surgeon: Estill Cotta, MD;  Location: [facility];  Service: Ophthalmology;  Laterality: Right;  [place] 01:28 AP% 23.4 CDE 36.14 fluid pack lot # [id number]   CATARACT EXTRACTION W/PHACO Left 07/28/2015   Procedure: CATARACT EXTRACTION PHACO AND INTRAOCULAR LENS PLACEMENT (IOC);  Surgeon: Estill Cotta, MD;  Location: [facility];  Service: Ophthalmology;  Laterality: Left;  [place]    1:29.7 AP%  24.9 CDE   41.32 fluid casette lot #[id number] H exp 09/23/2016   COONOSCOPY     AND ENDOSCOPY   DILATION AND CURETTAGE OF UTERUS     ENDARTERECTOMY Left 11/13/2020   Procedure: Evacuation of left neck hematoma;  Surgeon: Katha Cabal, MD;  Location:  [facility];  Service: Vascular;  Laterality: Left;   ENDARTERECTOMY Left 11/13/2020   Procedure: ENDARTERECTOMY CAROTID;  Surgeon: Algernon Huxley, MD;  Location: [facility];  Service: Vascular;  Laterality: Left;   TONSILLECTOMY     TUBAL LIGATION     Patient Active Problem List   Diagnosis Date Noted   Carotid stenosis, symptomatic, with infarction ([place]) 11/13/2020   Gout flare 11/10/2020   UTI (urinary tract infection) 11/10/2020   Left carotid artery stenosis 11/10/2020   Chronic kidney disease 11/10/2020   Left basal ganglia embolic stroke ([place]) 96/29/5284   PAC (premature atrial contraction)    Pre-procedural cardiovascular examination    Ischemic stroke ([place]) 10/20/2020   TIA (transient ischemic attack) 10/19/2020   Right hemiparesis ([place]) 10/19/2020   Type 2 diabetes mellitus with stage 3 chronic kidney disease, without long-term current use of insulin ([place]) 08/24/2018   Hyperlipidemia, unspecified 07/19/2017   Hypertension 07/19/2017   PUD (peptic ulcer disease) 07/19/2017   Type 2 diabetes mellitus ([place]) 07/19/2017   Personal history of gout 08/12/2016   Anemia, unspecified 04/09/2016   Hypothyroidism, acquired 12/10/2015   REFERRING DIAG: CVA  THERAPY DIAG:  Muscle weakness (generalized)  Other lack of coordination  Rationale for Evaluation and Treatment Rehabilitation  PERTINENT HISTORY:   R  ankle pain, gout, T2DM, HTN, CKDIII; R/L carotid endarectomy  PRECAUTIONS: None  SUBJECTIVE: Pt. Reports doing well today  PAIN:  Are you having pain? No     OBJECTIVE:   TODAY'S TREATMENT:   Pt. worked on pinch strengthening in the right hand for lateral, and 3pt. pinch using yellow, red, green, and blue resistive clips. Pt. worked on placing the clips at various vertical and horizontal angles. Tactile and verbal cues were required for eliciting the desired movement. Pt. worked on attempting to move the clips from the 3 point pinch position through her right hand to the  lateral pinch position. Pt. worked on using her right hand to turn pages in a book. Pt. worked on Financial planner, very light 1/2" x 1/4" cards, and  worked on flipping, and stacking them at a tabletop surface.   Pt. continues to make progress with her RUE, and now reports that she is able to grasp, and hold items without over gripping them. Pt. Continues to work on improving  RUE [facility], and hand  function in order to work towards improving, and maximizing independence with ADLs, and IADL tasks. Pt. was able to turn pages in a book efficiently for thicker pages. Pt. continues to have difficulty grasping, and flipping small flat cards, however had difficulty with stacking the cards. Pt. Continues to work on improving, and maximizing independence with ADLs, and IADL tasks.      PATIENT EDUCATION: Education details: Right hand motor control, and coordination skills. Person educated: Patient Education method: Explanation, Demonstration, Tactile cues, and Handouts Education comprehension: verbalized understanding, returned demonstration, verbal cues required, and needs further education   HOME EXERCISE PROGRAM  Pt. to continue with current HEPs with ongoing updates as indicated   OT Short Term Goals - 03/23/21 1657       OT SHORT TERM GOAL #1   Title Pt will perform HEP for RUE strength/coordination independently.    Baseline Eval: HEP not yet established in outpatient, but pt is working with putty from [name]; 12/04/2020; [facility] HEP initiated this day with mod vc after return demo; 01/05/2021: indep with currently program, but ongoing as pt progresses; 01/28/2021: vc to revisit and focus on grip and pinch strengthening with putty; 03/23/21: pt is indep with HEP    Time 6    Period Weeks    Status Achieved    Target Date 03/23/21              OT Long Term Goals - 10/15/21 1322       OT LONG TERM GOAL #1   Title Pt will improve hand writing to 100% legibility with R hand to be able to  independently write a check.    Baseline Eval: signature is 75% legible with R hand, requiring extra time; 12/04/2020: no chan to complete.ge from eval; 01/05/2021: Printing is 100% legible, signature is 90% legible, but still requires extra time and effort.; 01/26/21: Signature is 90% legible and speed/fluidity have improved. 20th visit: 90% legible, 03/23/21: 90% legible, extra time 40th visit 90 % legibility with increased time, 50th visit: 90% legible with with increased time required 07/14/2021: 90% legibility. 07/30/2021: 90% legibility, pt. presses too hard through the pen, and pencil. 09/03/2021: 90% legibility; 10/06/21: Increased time, but pt states she signs cards and writes short notes on a card sufficiently.  Pt states she has no need to write checks anymore as her family assists with this and she has no desire to return to this, but she  would like to work on her pressure modulation.  New goal below. 10/15/2021: Pt. conitnues to require increased time to complete note cards, and corespondence.    Time 12    Period Weeks    Status Partially Met    Target Date 12/28/21      OT LONG TERM GOAL #2   Title Pt will improve R hand coordination to enable good manipulation of iphone using R dominant hand      OT LONG TERM GOAL #3   Title Pt will improve GMC throughout RUE to enable pt to reach for and pick up ADL supplies with R dominant hand without dropping or knocking over objects.    Baseline Eval: Pt reports RUE is clumsy and easily knocks over objects when trying to reach for ADL supplies.  Pt verbalizes that her "aim is off."; 12/04/2020: pt reports slight improvement since eval and has started using her R hand to eat, but still feels clumsy; 01/05/2021: Greatly improved; pt is using silverware to eat with R hand, can comb hair with RUE, still mildly ataxic; 01/26/21: pt reports difficulty picking up objects within a small space (ie; may knock the toothpaste holder down when reaching for toothbrush), but  reaching for light/larger objects is improving 20th visit: pt. continues to be mildly ataxic, however is consistently improving with motor control, and [place] while reaching targets; 03/23/21: Pt reaches with accuracy towards targets if she takes her time (pt drops and knocks things over when moving at a faster/normal pace) 04/23/2021: Pt. continues to present with difficulty with depth perception, and impaired [place].40th visit: Pt. is improving, however tends to knock over lighter weight objects.50th visit: Pt. has improved with reaching for items without knocking them over, however continues to need work on accuracy.07/14/2021: Pt. is improving while motor control, and accuracy of reaching for objects. Pt. continues to work towards improving efficiency with reaching for objects. 07/30/2021: Pt. is able to able to reach for objects more accurately, however continues to present with limited motor control in the Right. 09/03/2021: Pt. Pt. is improving with reaching up without knocking items over. pt. is now able to stand at the sink and reach up without dropping items; 10/06/21: pt estimates she reaches with 85-90% accurate with RUE, occasional dropping. 10/15/2021: Pt. reports 85-90% accureacy, however presents with decreased accuracy the higher, or lower she reaches.    Time 12    Period Weeks    Status On-going    Target Date 12/28/21      OT LONG TERM GOAL #4   Title Pt will improve FOTO score to 68 or better to indicate measurable functional improvement.    Baseline Eval: FOTO 55; 01/05/2021: FOTO 61; 01/28/21: FOTO 61, 20th visit: 61; 03/23/21: FOTO 60 4oth visit: FOTO score: 61, 50th visit: FOTO 69 07/30/2021: FOTO 71  09/03/2021: FOTO 73; 10/06/21: 73 10/15/2021: 77    Time 12    Period Weeks    Status On-going      OT LONG TERM GOAL #6   Title Pt. will improve right hand [facility] skills to be able to indepedendently manipulate and set-up her medication/pills in the pillbox.    Baseline 07/14/2021: Pt. has  difficulty manipulating small pills, and often drops them. 07/30/2021: Pt. continues to have difficulty grasping pills/medication for set-up of pillbox. 09/03/2021: Pt. is able to grasp pills from the pillbox without dropping them; 10/06/21: modified indep (with extra time to complete). 10/15/2021: Pt. reports requiring increased time to complete.  Time 12    Period Weeks    Status Partially Met    Target Date 12/28/21      OT LONG TERM GOAL #7   Title Pt will improve accuracy with graded force through RUE during functional tasks per self report.    Baseline 10/06/21: Pt reports she holds her greatgrandchild too tightly, scratches her arm too forcefully, and sometimes writes with too much pressure; pt struggles to grade force for functional activities.10/15/2021: Pt. continues to grasp items with the left hand too tightly.    Time 12    Period Weeks    Status Achieved              Plan - 10/22/21 1311     Clinical Impression Statement Pt. continues to make progress with her RUE, and now reports that she is able to grasp, and hold items without over gripping them. Pt. Continues to work on improving  RUE [facility], and hand  function in order to work towards improving, and maximizing independence with ADLs, and IADL tasks. Pt. was able to turn pages in a book efficiently for thicker pages. Pt. continues to have difficulty grasping, and flipping small flat cards, however had difficulty with stacking the cards. Pt. Continues to work on improving, and maximizing independence with ADLs, and IADL tasks.       OT Occupational Profile and History Detailed Assessment- Review of Records and additional review of physical, cognitive, psychosocial history related to current functional performance    Occupational performance deficits (Please refer to evaluation for details): ADL's;Leisure;IADL's    Body Structure / Function / Physical Skills ADL;Coordination;Endurance;GMC;UE functional use;Balance;Sensation;Body  mechanics;IADL;Pain;Dexterity;FMC;Strength;Gait;Mobility    Rehab Potential Good    Clinical Decision Making Several treatment options, min-mod task modification necessary    Comorbidities Affecting Occupational Performance: May have comorbidities impacting occupational performance    Modification or Assistance to Complete Evaluation  Min-Moderate modification of tasks or assist with assess necessary to complete eval    OT Frequency 2x / week    OT Duration 12 weeks    OT Treatment/Interventions Self-care/ADL training;Therapeutic exercise;DME and/or AE instruction;Functional Mobility Training;Balance training;Neuromuscular education;Therapeutic activities;Patient/family education    Family Member Consulted daughter, Emi Holes [place], MS, OTR/L  Harrel Carina, OT 11/12/2021, 1:30 PM

## 2021-11-17 ENCOUNTER — Ambulatory Visit: Payer: Medicare HMO | Admitting: Occupational Therapy

## 2021-11-17 ENCOUNTER — Ambulatory Visit: Payer: Medicare HMO | Admitting: Physical Therapy

## 2021-11-17 DIAGNOSIS — R278 Other lack of coordination: Secondary | ICD-10-CM | POA: Diagnosis not present

## 2021-11-17 DIAGNOSIS — R2681 Unsteadiness on feet: Secondary | ICD-10-CM

## 2021-11-17 DIAGNOSIS — R482 Apraxia: Secondary | ICD-10-CM

## 2021-11-17 DIAGNOSIS — R269 Unspecified abnormalities of gait and mobility: Secondary | ICD-10-CM

## 2021-11-17 DIAGNOSIS — M6281 Muscle weakness (generalized): Secondary | ICD-10-CM

## 2021-11-17 DIAGNOSIS — R262 Difficulty in walking, not elsewhere classified: Secondary | ICD-10-CM

## 2021-11-17 DIAGNOSIS — R2689 Other abnormalities of gait and mobility: Secondary | ICD-10-CM

## 2021-11-17 NOTE — Therapy (Signed)
OUTPATIENT OCCUPATIONAL THERAPY TREATMENT NOTE   Patient Name: Cassandra Schaefer MRN: [id number] DOB:[birth year], 88 y.o., female Today's Date: 11/17/2021   REFERRING PROVIDER: Tracie Harrier, MD    OT End of Session - 11/17/21 1443     Visit Number 39    Number of Visits 114    Date for OT Re-Evaluation 12/28/21    Authorization Time Period Reporting period starting 09/03/2021    OT Start Time 1435    OT Stop Time 1515    OT Time Calculation (min) 40 min    Equipment Utilized During Treatment tranport chair    Activity Tolerance Patient tolerated treatment well    Behavior During Therapy WFL for tasks assessed/performed                    Past Medical History:  Diagnosis Date   Anemia    Cough    RESOLVING, NO FEVER   Diabetes mellitus without complication ([place])    Gout    Hypertension    Hypothyroidism    PUD (peptic ulcer disease)    Stroke [facility])    Wears dentures    full upper, partial lower   Past Surgical History:  Procedure Laterality Date   AMPUTATION TOE Left 12/14/2017   Procedure: AMPUTATION TOE-4TH MPJ;  Surgeon: Samara Deist, DPM;  Location: [place];  Service: Podiatry;  Laterality: Left;  IVA LOCAL Diabetic - oral meds   APPENDECTOMY     CATARACT EXTRACTION W/PHACO Right 06/23/2015   Procedure: CATARACT EXTRACTION PHACO AND INTRAOCULAR LENS PLACEMENT (IOC);  Surgeon: Estill Cotta, MD;  Location: [facility];  Service: Ophthalmology;  Laterality: Right;  [place] 01:28 AP% 23.4 CDE 36.14 fluid pack lot # [id number]   CATARACT EXTRACTION W/PHACO Left 07/28/2015   Procedure: CATARACT EXTRACTION PHACO AND INTRAOCULAR LENS PLACEMENT (IOC);  Surgeon: Estill Cotta, MD;  Location: [facility];  Service: Ophthalmology;  Laterality: Left;  [place]    1:29.7 AP%  24.9 CDE   41.32 fluid casette lot #[id number] H exp 09/23/2016   COONOSCOPY     AND ENDOSCOPY   DILATION AND CURETTAGE OF UTERUS     ENDARTERECTOMY Left 11/13/2020   Procedure: Evacuation of  left neck hematoma;  Surgeon: Katha Cabal, MD;  Location: [facility];  Service: Vascular;  Laterality: Left;   ENDARTERECTOMY Left 11/13/2020   Procedure: ENDARTERECTOMY CAROTID;  Surgeon: Algernon Huxley, MD;  Location: [facility];  Service: Vascular;  Laterality: Left;   TONSILLECTOMY     TUBAL LIGATION     Patient Active Problem List   Diagnosis Date Noted   Carotid stenosis, symptomatic, with infarction ([place]) 11/13/2020   Gout flare 11/10/2020   UTI (urinary tract infection) 11/10/2020   Left carotid artery stenosis 11/10/2020   Chronic kidney disease 11/10/2020   Left basal ganglia embolic stroke ([place]) 40/34/7425   PAC (premature atrial contraction)    Pre-procedural cardiovascular examination    Ischemic stroke ([place]) 10/20/2020   TIA (transient ischemic attack) 10/19/2020   Right hemiparesis ([place]) 10/19/2020   Type 2 diabetes mellitus with stage 3 chronic kidney disease, without long-term current use of insulin ([place]) 08/24/2018   Hyperlipidemia, unspecified 07/19/2017   Hypertension 07/19/2017   PUD (peptic ulcer disease) 07/19/2017   Type 2 diabetes mellitus ([place]) 07/19/2017   Personal history of gout 08/12/2016   Anemia, unspecified 04/09/2016   Hypothyroidism, acquired 12/10/2015   REFERRING DIAG: CVA  THERAPY DIAG:  Muscle weakness (generalized)  Other lack of coordination  Rationale for  Evaluation and Treatment Rehabilitation  PERTINENT HISTORY:   R ankle pain, gout, T2DM, HTN, CKDIII; R/L carotid endarectomy  PRECAUTIONS: None  SUBJECTIVE: Pt. Reports doing well today  PAIN:  Are you having pain? No     OBJECTIVE:   TODAY'S TREATMENT:    Pt. worked on translatory movements grasping 1/2" flat circular [place] marbles with her 2nd digit, and thumb, and moving them to her palm for storage and then moving them from her palm to the tip of her 2nd digit, and thumb. Pt. also worked on controlled release of them one at a time from the ulnar aspect of her  palm.   Pt. continues to make progress with right hand functioning. Pt. presents with limited right hand [facility] skills, and continues to present with difficulty performing translatory movements with the right hand. Pt. was able to grasp, and store the 1/2" circular pegs in the palm of her hand, however, dropped fewer of the the objects items when attempting to perform  translatory movements moving them through her palm, as well as controlled dropping them from her palm one at a time.  Occasionally, pt. dropped the objects from the tips of her 2nd digit when grasping them with 2nd digit, and  preparing to move them to her palm. Pt. Continues to work on improving  RUE [place], and hand functioning in order to work towards improving, and maximizing independence with ADLs, and IADLs   PATIENT EDUCATION: Education details: Right hand motor control, and coordination skills. Person educated: Patient Education method: Explanation, Demonstration, Tactile cues, and Handouts Education comprehension: verbalized understanding, returned demonstration, verbal cues required, and needs further education   HOME EXERCISE PROGRAM  Pt. to continue with current HEPs with ongoing updates as indicated   OT Short Term Goals - 03/23/21 1657       OT SHORT TERM GOAL #1   Title Pt will perform HEP for RUE strength/coordination independently.    Baseline Eval: HEP not yet established in outpatient, but pt is working with putty from [name]; 12/04/2020; [facility] HEP initiated this day with mod vc after return demo; 01/05/2021: indep with currently program, but ongoing as pt progresses; 01/28/2021: vc to revisit and focus on grip and pinch strengthening with putty; 03/23/21: pt is indep with HEP    Time 6    Period Weeks    Status Achieved    Target Date 03/23/21              OT Long Term Goals - 10/15/21 1322       OT LONG TERM GOAL #1   Title Pt will improve hand writing to 100% legibility with R hand to be able to  independently write a check.    Baseline Eval: signature is 75% legible with R hand, requiring extra time; 12/04/2020: no chan to complete.ge from eval; 01/05/2021: Printing is 100% legible, signature is 90% legible, but still requires extra time and effort.; 01/26/21: Signature is 90% legible and speed/fluidity have improved. 20th visit: 90% legible, 03/23/21: 90% legible, extra time 40th visit 90 % legibility with increased time, 50th visit: 90% legible with with increased time required 07/14/2021: 90% legibility. 07/30/2021: 90% legibility, pt. presses too hard through the pen, and pencil. 09/03/2021: 90% legibility; 10/06/21: Increased time, but pt states she signs cards and writes short notes on a card sufficiently.  Pt states she has no need to write checks anymore as her family assists with this and she has no desire to return to this, but  she would like to work on her pressure modulation.  New goal below. 10/15/2021: Pt. conitnues to require increased time to complete note cards, and corespondence.    Time 12    Period Weeks    Status Partially Met    Target Date 12/28/21      OT LONG TERM GOAL #2   Title Pt will improve R hand coordination to enable good manipulation of iphone using R dominant hand      OT LONG TERM GOAL #3   Title Pt will improve GMC throughout RUE to enable pt to reach for and pick up ADL supplies with R dominant hand without dropping or knocking over objects.    Baseline Eval: Pt reports RUE is clumsy and easily knocks over objects when trying to reach for ADL supplies.  Pt verbalizes that her "aim is off."; 12/04/2020: pt reports slight improvement since eval and has started using her R hand to eat, but still feels clumsy; 01/05/2021: Greatly improved; pt is using silverware to eat with R hand, can comb hair with RUE, still mildly ataxic; 01/26/21: pt reports difficulty picking up objects within a small space (ie; may knock the toothpaste holder down when reaching for toothbrush), but  reaching for light/larger objects is improving 20th visit: pt. continues to be mildly ataxic, however is consistently improving with motor control, and [place] while reaching targets; 03/23/21: Pt reaches with accuracy towards targets if she takes her time (pt drops and knocks things over when moving at a faster/normal pace) 04/23/2021: Pt. continues to present with difficulty with depth perception, and impaired [place].40th visit: Pt. is improving, however tends to knock over lighter weight objects.50th visit: Pt. has improved with reaching for items without knocking them over, however continues to need work on accuracy.07/14/2021: Pt. is improving while motor control, and accuracy of reaching for objects. Pt. continues to work towards improving efficiency with reaching for objects. 07/30/2021: Pt. is able to able to reach for objects more accurately, however continues to present with limited motor control in the Right. 09/03/2021: Pt. Pt. is improving with reaching up without knocking items over. pt. is now able to stand at the sink and reach up without dropping items; 10/06/21: pt estimates she reaches with 85-90% accurate with RUE, occasional dropping. 10/15/2021: Pt. reports 85-90% accureacy, however presents with decreased accuracy the higher, or lower she reaches.    Time 12    Period Weeks    Status On-going    Target Date 12/28/21      OT LONG TERM GOAL #4   Title Pt will improve FOTO score to 68 or better to indicate measurable functional improvement.    Baseline Eval: FOTO 55; 01/05/2021: FOTO 61; 01/28/21: FOTO 61, 20th visit: 61; 03/23/21: FOTO 60 4oth visit: FOTO score: 61, 50th visit: FOTO 69 07/30/2021: FOTO 71  09/03/2021: FOTO 73; 10/06/21: 73 10/15/2021: 77    Time 12    Period Weeks    Status On-going      OT LONG TERM GOAL #6   Title Pt. will improve right hand [facility] skills to be able to indepedendently manipulate and set-up her medication/pills in the pillbox.    Baseline 07/14/2021: Pt. has  difficulty manipulating small pills, and often drops them. 07/30/2021: Pt. continues to have difficulty grasping pills/medication for set-up of pillbox. 09/03/2021: Pt. is able to grasp pills from the pillbox without dropping them; 10/06/21: modified indep (with extra time to complete). 10/15/2021: Pt. reports requiring increased time to complete.  Time 12    Period Weeks    Status Partially Met    Target Date 12/28/21      OT LONG TERM GOAL #7   Title Pt will improve accuracy with graded force through RUE during functional tasks per self report.    Baseline 10/06/21: Pt reports she holds her greatgrandchild too tightly, scratches her arm too forcefully, and sometimes writes with too much pressure; pt struggles to grade force for functional activities.10/15/2021: Pt. continues to grasp items with the left hand too tightly.    Time 12    Period Weeks    Status Achieved              Plan - 10/22/21 1311     Clinical Impression Statement Pt. continues to make progress with Pt. continues to make progress with right hand functioning. Pt. presents with limited right hand [facility] skills, and continues to present with difficulty performing translatory movements with the right hand. Pt. was able to grasp, and store the 1/2" circular pegs in the palm of her hand, however, dropped fewer of the the objects items when attempting to perform  translatory movements moving them through her palm, as well as controlled dropping them from her palm one at a time.  Occasionally, pt. dropped the objects from the tips of her 2nd digit when grasping them with 2nd digit, and  preparing to move them to her palm. Pt. Continues to work on improving  RUE [place], and hand functioning in order to work towards improving, and maximizing independence with ADLs, and IADLs        OT Occupational Profile and History Detailed Assessment- Review of Records and additional review of physical, cognitive, psychosocial history related to current  functional performance    Occupational performance deficits (Please refer to evaluation for details): ADL's;Leisure;IADL's    Body Structure / Function / Physical Skills ADL;Coordination;Endurance;GMC;UE functional use;Balance;Sensation;Body mechanics;IADL;Pain;Dexterity;FMC;Strength;Gait;Mobility    Rehab Potential Good    Clinical Decision Making Several treatment options, min-mod task modification necessary    Comorbidities Affecting Occupational Performance: May have comorbidities impacting occupational performance    Modification or Assistance to Complete Evaluation  Min-Moderate modification of tasks or assist with assess necessary to complete eval    OT Frequency 2x / week    OT Duration 12 weeks    OT Treatment/Interventions Self-care/ADL training;Therapeutic exercise;DME and/or AE instruction;Functional Mobility Training;Balance training;Neuromuscular education;Therapeutic activities;Patient/family education    Family Member Consulted daughter, Emi Holes Dana, MS, OTR/L  Harrel Carina, OT 11/17/2021, 3:16 PM

## 2021-11-17 NOTE — Therapy (Signed)
OUTPATIENT PHYSICAL THERAPY TREATMENT NOTE    Patient Name: Cassandra Schaefer MRN: [id number] DOB:[birth year], 88 y.o., female Today's Date: 11/18/2021  PCP: Roetta Sessions MD REFERRING PROVIDER: Roetta Sessions MD   PT End of Session - 11/17/21 1359     Visit Number 88    Number of Visits 102    Date for PT Re-Evaluation 12/17/21    Authorization Type [employer]    Authorization Time Period 10/22/21-12/17/21    Progress Note Due on Visit 90    PT Start Time 1350    PT Stop Time 1430    PT Time Calculation (min) 40 min    Equipment Utilized During Treatment Gait belt    Activity Tolerance Patient tolerated treatment well;No increased pain    Behavior During Therapy WFL for tasks assessed/performed              Past Medical History:  Diagnosis Date   Anemia    Cough    RESOLVING, NO FEVER   Diabetes mellitus without complication ([place])    Gout    Hypertension    Hypothyroidism    PUD (peptic ulcer disease)    Stroke [facility])    Wears dentures    full upper, partial lower   Past Surgical History:  Procedure Laterality Date   AMPUTATION TOE Left 12/14/2017   Procedure: AMPUTATION TOE-4TH MPJ;  Surgeon: Samara Deist, DPM;  Location: [facility] [place] [facility];  Service: Podiatry;  Laterality: Left;  IVA LOCAL Diabetic - oral meds   APPENDECTOMY     CATARACT EXTRACTION W/PHACO Right 06/23/2015   Procedure: CATARACT EXTRACTION PHACO AND INTRAOCULAR LENS PLACEMENT (IOC);  Surgeon: Estill Cotta, MD;  Location: [facility];  Service: Ophthalmology;  Laterality: Right;  [place] 01:28 AP% 23.4 CDE 36.14 fluid pack lot # [id number]   CATARACT EXTRACTION W/PHACO Left 07/28/2015   Procedure: CATARACT EXTRACTION PHACO AND INTRAOCULAR LENS PLACEMENT (IOC);  Surgeon: Estill Cotta, MD;  Location: [facility];  Service: Ophthalmology;  Laterality: Left;  [place]    1:29.7 AP%  24.9 CDE   41.32 fluid casette lot #[id number] H exp 09/23/2016   COONOSCOPY     AND ENDOSCOPY   DILATION AND CURETTAGE OF UTERUS      ENDARTERECTOMY Left 11/13/2020   Procedure: Evacuation of left neck hematoma;  Surgeon: Katha Cabal, MD;  Location: [facility];  Service: Vascular;  Laterality: Left;   ENDARTERECTOMY Left 11/13/2020   Procedure: ENDARTERECTOMY CAROTID;  Surgeon: Algernon Huxley, MD;  Location: [facility];  Service: Vascular;  Laterality: Left;   TONSILLECTOMY     TUBAL LIGATION     Patient Active Problem List   Diagnosis Date Noted   Carotid stenosis, symptomatic, with infarction ([place]) 11/13/2020   Gout flare 11/10/2020   UTI (urinary tract infection) 11/10/2020   Left carotid artery stenosis 11/10/2020   Chronic kidney disease 11/10/2020   Left basal ganglia embolic stroke ([place]) 66/09/3014   PAC (premature atrial contraction)    Pre-procedural cardiovascular examination    Ischemic stroke ([place]) 10/20/2020   TIA (transient ischemic attack) 10/19/2020   Right hemiparesis ([place]) 10/19/2020   Type 2 diabetes mellitus with stage 3 chronic kidney disease, without long-term current use of insulin ([place]) 08/24/2018   Hyperlipidemia, unspecified 07/19/2017   Hypertension 07/19/2017   PUD (peptic ulcer disease) 07/19/2017   Type 2 diabetes mellitus ([place]) 07/19/2017   Personal history of gout 08/12/2016   Anemia, unspecified 04/09/2016   Hypothyroidism, acquired 12/10/2015    REFERRING DIAG: CVA with right  hemiparesis  THERAPY DIAG:  Muscle weakness (generalized)  Other lack of coordination  Unsteadiness on feet  Other abnormalities of gait and mobility  Abnormality of gait and mobility  Apraxia  Difficulty in walking, not elsewhere classified  Rationale for Evaluation and Treatment Rehabilitation  PERTINENT HISTORY: 88 y.o. female with medical history significant for type 2 diabetes mellitus, essential hypertension, acquired hypothyroidism, hyperlipidemia, stage III chronic kidney disease reports increased right sided weakness on 10/19/20. She was diagnosed with left basal ganglia ischemic  stroke. She was discharged to inpatient rehab for 10 days and then discharged home. Patient was evaluated by outpatient PT on 11/12/20. CVA was due to carotid stenosis. She underwent left carotid endartectomy on 7/21. Procedure went well however patient suffered from left cervical hematoma and had subsequent surgical procedure to stop the bleed and remove the hematoma on 11/13/20. They were concerned with her breathing and therefore intubated her for 1 day, she was in ICU for 2 days and transferred to med/surge on 11/15/20. Patient was discharged home on 11/17/20 and is now returning to outpatient PT. She has had the stiches removed and is healing well. Denies any neck pain or stiffness. She lives with her daughter and has 24/7 caregiver support. She is still using RW for most ambulation. She is able to transfer to bedside commode well and is able to stand short time unsupported. She reports feeling very weak and fatigued. She reports she has been dragging her right foot more. She denies any numbness/tingling; She reports feeling like her legs are wanting to do more but is hesitant because of fear of falling. She is mod I for self care morning routine. She does still receive help with showering. She has been working on increasing her activity but denies any formal exercise.   PRECAUTIONS: Fall risk  SUBJECTIVE: Pt reports doing well with no significant changes since last session. She did see her vascular doctor who said that her carotids are doing well- no blockages. She doesn't have to go back for 1 year; She is going to see her PCP first of August;   PAIN:  Are you having pain? No   TODAY'S TREATMENT: 11/18/2021   BP at start of session: 148/53, HR62  There Ex:  Standing on 1/2 bolster (round side up): -toe raises x15 reps; -heel raises x15 reps; Required intermittent rail assist for safety;   Leg press: BLE 55# 2x12 with good tolerance- reports moderate challenge but no pain;   Forward step ups  on 6 inch step with no rail assist x10 reps each LE, required CGA for safety, also required cues for forward weight shift for better step negotiation; Pt reports significant difficulty requiring increased time/effort;    NMR:  Forward/backward stepping over 1/2 bolster: Unsupported x10 reps with min A for safety; does occasionally mis-step with RLE;    Pt educated throughout session about proper posture and technique with exercises. Improved exercise technique, movement at target joints, use of target muscles after min to mod verbal, visual, tactile cues.    Pt required occasional rest breaks due fatigue and likely increase in blood pressure, PT was quick to ask when pt appeared to be fatiguing in order to prevent excessive fatigue.     PATIENT EDUCATION: Education details: Exercise technique/positioning; HEP Person educated: Patient Education method: Explanation, Demonstration, and Verbal cues Education comprehension: verbalized understanding, returned demonstration, and needs further education   HOME EXERCISE PROGRAM: No changes this session;   Instructed patient in pool exercise: Access  Code: [email or web address] URL: [email or web address] Date: 10/22/2021 Prepared by: Blanche East  Exercises - Seated Knee Extension with Resistance  - 1 x daily - 7 x weekly - 2 sets - 15 reps - Forward and Backward Stepping at [employer]  - 1 x daily - 7 x weekly - 1 sets - 5 reps - Freestyle Swimming with Snorkel Mask  - 1 x daily - 7 x weekly - 1 sets - 2-3 reps - Heel Walking  - 1 x daily - 7 x weekly - 1 sets - 5 reps - Toe Walking  - 1 x daily - 7 x weekly - 1 sets - 5 reps - Single Leg Stance at Pool Wall  - 1 x daily - 7 x weekly - 1 sets - 3-5 reps - 15-30 sec hold   PT Short Term Goals -       PT SHORT TERM GOAL #1   Title Patient will be adherent to HEP at least 3x a week to improve functional strength and balance for better safety at home.    Baseline 9/21: Pt reports  compliance with HEP and is confident, 1/3: doing exercise 2x a week; 06/25/21: 3x/week, 5/18: consistent;    Time 4    Period Weeks    Status Achieved    Target Date 06/25/21      PT SHORT TERM GOAL #2   Title Patient will be independent in transferring sit<>Stand without pushing on arm rests to improve ability to get up from chair.    Baseline 9/21: pt able to complete STS without use of UEs, 1/3:able to stand but has moderate difficulty requiring increased time; 06/25/21: able to perform indep without hands but requiring use of momentum to attain standing. Remains stable. 5/18: able to stand without UE assist, close supervision, remains stable;    Time 4    Period Weeks    Status Achieved    Target Date 07/23/21              PT Long Term Goals -      PT LONG TERM GOAL #1   Title Patient (> 57 years old) will complete five times sit to stand test in < 15 seconds indicating an increased LE strength and improved balance.    Baseline 9/21: 29 seconds hands-free 10/10: deferred 10/12: 34 sec hands free; 11/2: 23.8 sec hands-free; 12/7: 33.2 sec hands free, 1/3: 33.92 hands free; 06/25/21: 25.69 sec, 4/13: 15.5 sec hands free, 5/18: 23 sec without UE 6/22: deferred   Time 8    Period Weeks    Status Partially Met    Target Date  12/17/21     PT LONG TERM GOAL #2   Title Patient will increase six minute walk test distance to >400 feet for improve gait ability    Baseline 9/21: 368 ft with RW; 10/10: deferred 10/12: 408 ft; 11/2: 476 ft with 4WW, 1/3: 475 feet with RW; 4/13: 450 feet without AD CGA 10/15/21: 460 feet without AD, CGA   Time 8    Period Weeks    Status Achieved    Target Date 12/17/21     PT LONG TERM GOAL #3   Title Patient will increase 10 meter walk test to >1.53ms as to improve gait speed for better community ambulation and to reduce fall risk.    Baseline 9/21: 0.5 m/s with RW; 10/10 deferred 10/12: 0.93 m/s with 41RA 11/2: 0.83 m/s with 43EN 12/7: 0.52 m/s with RW,  1/3:  0.704; 06/25/21: 0.55 m/s with SPC. 5/18: 0.4 m/s without AD , 10/15/21 0.507 m/s without AD   Time 8    Period Weeks    Status Partially Met   Target Date 12/17/21     PT LONG TERM GOAL #4   Title Patient will be independent with ascend/descend 6 steps using single UE in step over step pattern without LOB.    Baseline 9/21:  Patient uses PT stairs (4 steps) with bilateral upper extremity support on handrails for both ascending/descending.  Patient exhibits reciprocal pattern ascending and step to pattern descending. 10/10: deferred; 10/12: ascending/descending recip. steps with BUE support; 11/2: Ascended/descended 8 steps total, reciprocal pattern ascending and step-to descending. Pt uses UUE support throughout with CGA assist.; 12/7: Ascended and descended 8 steps total with UUE support and CGA. Reciprocal stepping ascending and step to descending., 1/3: Deferrred; 06/25/21: able to perform with SUE support and reciprocal pattern asc/desc, but does require close CGA with descending. 5/18: independent with 2 handrails, close supervision with 1 handrai; 10/15/21: requires 2 handrails   Time 8    Period Weeks    Status Partially Met    Target Date 12/17/21     PT LONG TERM GOAL #5   Title Patient will demonstrate an improved Berg Balance Score of >45/56 as to demonstrate improved balance with ADLs such as sitting/standing and transfer balance and reduced fall risk.    Baseline 9/21: deferred d/t time; 9/29: 42/56; 10/10: deferred; 10/12: 44/56; 11/2: deferred; 11/7: 46/56, 1/3: deferred    Time 8    Period Weeks    Status Achieved    Target Date 12/17/21     PT LONG TERM GOAL #6   Title Patient will improve FOTO score to >60% to indicate improved functional mobility with ADLs.    Baseline 9/21: 59; 10/10: 57; 11/2: 60; 12/7: 58%, 1/3: 61%, 4/13: 56% 10/15/21: 58%   Time 8    Period Weeks    Status Partially Met   Target Date 12/17/21     PT LONG TERM GOAL #7   Title Pt will improve FGA  by a minimum of 5 points to indicate clinically significant improvement in reduced risk of falls with walking tasks.    Baseline 06/25/21: 12, 4/13: deferred, 5/18: deferred due to increased ankle discomfort; 6/22: deferred due to elevated BP- will assess next visit; 10/20/21: 8/30   Time 8    Period Weeks    Status Not met   Target Date 12/17/21             Plan -    Clinical Impression Statement Patient motivated and participated well within session. She was instructed in advanced LE strengthening. Progressed intensity of exercise with less rail assist to challenge dynamic balance and stance control. Patient reports increased challenge with less rail assist but was able to complete with CGA to min A from therapist. She was able to walk short distances in therapy gym without AD without dragging foot and without instability with supervision. Patient is improving, however does continues to have some apraxia with decreased control of RLE when negotiating obstacles. Patient would benefit from additional skilled PT intervention to improve strength, balance and gait safety;      Personal Factors and Comorbidities Age    Comorbidities HTN, CKD, fall risk, type 2 diabetes,    Examination-Activity Limitations Lift;Locomotion Level;Squat;Stairs;Stand;Toileting;Transfers;Bathing    Examination-Participation Restrictions Cleaning;Community Activity;Laundry;Meal Prep;Shop;Volunteer;Driving    Stability/Clinical Decision Making Stable/Uncomplicated    Rehab Potential Fair  PT Frequency 2x / week    PT Duration 8 weeks    PT Treatment/Interventions Cryotherapy;Electrical Stimulation;Moist Heat;Gait training;Stair training;Functional mobility training;Therapeutic activities;Therapeutic exercise;Neuromuscular re-education;Balance training;Patient/family education;Orthotic Fit/Training;Energy conservation    PT Next Visit Plan work on LE strengthening and balance; gait training, endurance, continue POC as  previously indicated    PT Home Exercise Plan No changes this date    Consulted and Agree with Plan of Care Patient                 Cincere Deprey, PT, DPT 11/18/2021, 10:37 AM

## 2021-11-18 ENCOUNTER — Encounter: Payer: Self-pay | Admitting: Physical Therapy

## 2021-11-19 ENCOUNTER — Ambulatory Visit: Payer: Medicare HMO | Admitting: Physical Therapy

## 2021-11-19 ENCOUNTER — Encounter: Payer: Self-pay | Admitting: Occupational Therapy

## 2021-11-19 ENCOUNTER — Encounter: Payer: Self-pay | Admitting: Physical Therapy

## 2021-11-19 ENCOUNTER — Ambulatory Visit: Payer: Medicare HMO | Admitting: Occupational Therapy

## 2021-11-19 DIAGNOSIS — R262 Difficulty in walking, not elsewhere classified: Secondary | ICD-10-CM

## 2021-11-19 DIAGNOSIS — R278 Other lack of coordination: Secondary | ICD-10-CM

## 2021-11-19 DIAGNOSIS — R2681 Unsteadiness on feet: Secondary | ICD-10-CM

## 2021-11-19 DIAGNOSIS — M6281 Muscle weakness (generalized): Secondary | ICD-10-CM

## 2021-11-19 DIAGNOSIS — R482 Apraxia: Secondary | ICD-10-CM

## 2021-11-19 DIAGNOSIS — R269 Unspecified abnormalities of gait and mobility: Secondary | ICD-10-CM

## 2021-11-19 DIAGNOSIS — R2689 Other abnormalities of gait and mobility: Secondary | ICD-10-CM

## 2021-11-19 NOTE — Therapy (Signed)
OUTPATIENT OCCUPATIONAL THERAPY TREATMENT NOTE   Patient Name: Cassandra Schaefer MRN: [id number] DOB:Aug 25, 1928, 88 y.o., female Today's Date: 11/19/2021   REFERRING PROVIDER: Tracie Harrier, MD    OT End of Session - 11/19/21 1309     Visit Number 20    Number of Visits 114    Date for OT Re-Evaluation 12/28/21    Authorization Time Period Reporting period starting 09/03/2021    OT Start Time 1300    OT Stop Time 1345    OT Time Calculation (min) 45 min    Equipment Utilized During Treatment tranport chair    Activity Tolerance Patient tolerated treatment well    Behavior During Therapy WFL for tasks assessed/performed                    Past Medical History:  Diagnosis Date   Anemia    Cough    RESOLVING, NO FEVER   Diabetes mellitus without complication ([place])    Gout    Hypertension    Hypothyroidism    PUD (peptic ulcer disease)    Stroke [facility])    Wears dentures    full upper, partial lower   Past Surgical History:  Procedure Laterality Date   AMPUTATION TOE Left 12/14/2017   Procedure: AMPUTATION TOE-4TH MPJ;  Surgeon: Samara Deist, DPM;  Location: [facility];  Service: Podiatry;  Laterality: Left;  IVA LOCAL Diabetic - oral meds   APPENDECTOMY     CATARACT EXTRACTION W/PHACO Right 06/23/2015   Procedure: CATARACT EXTRACTION PHACO AND INTRAOCULAR LENS PLACEMENT (IOC);  Surgeon: Estill Cotta, MD;  Location: [facility];  Service: Ophthalmology;  Laterality: Right;  [place] 01:28 AP% 23.4 CDE 36.14 fluid pack lot # [id number]   CATARACT EXTRACTION W/PHACO Left 07/28/2015   Procedure: CATARACT EXTRACTION PHACO AND INTRAOCULAR LENS PLACEMENT (IOC);  Surgeon: Estill Cotta, MD;  Location: [facility];  Service: Ophthalmology;  Laterality: Left;  [place]    1:29.7 AP%  24.9 CDE   41.32 fluid casette lot #[id number] H exp 09/23/2016   COONOSCOPY     AND ENDOSCOPY   DILATION AND CURETTAGE OF UTERUS     ENDARTERECTOMY Left 11/13/2020   Procedure: Evacuation of  left neck hematoma;  Surgeon: Katha Cabal, MD;  Location: [facility];  Service: Vascular;  Laterality: Left;   ENDARTERECTOMY Left 11/13/2020   Procedure: ENDARTERECTOMY CAROTID;  Surgeon: Algernon Huxley, MD;  Location: [facility];  Service: Vascular;  Laterality: Left;   TONSILLECTOMY     TUBAL LIGATION     Patient Active Problem List   Diagnosis Date Noted   Carotid stenosis, symptomatic, with infarction ([place]) 11/13/2020   Gout flare 11/10/2020   UTI (urinary tract infection) 11/10/2020   Left carotid artery stenosis 11/10/2020   Chronic kidney disease 11/10/2020   Left basal ganglia embolic stroke ([place]) 52/48/1859   PAC (premature atrial contraction)    Pre-procedural cardiovascular examination    Ischemic stroke ([place]) 10/20/2020   TIA (transient ischemic attack) 10/19/2020   Right hemiparesis ([place]) 10/19/2020   Type 2 diabetes mellitus with stage 3 chronic kidney disease, without long-term current use of insulin ([place]) 08/24/2018   Hyperlipidemia, unspecified 07/19/2017   Hypertension 07/19/2017   PUD (peptic ulcer disease) 07/19/2017   Type 2 diabetes mellitus ([place]) 07/19/2017   Personal history of gout 08/12/2016   Anemia, unspecified 04/09/2016   Hypothyroidism, acquired 12/10/2015   REFERRING DIAG: CVA  THERAPY DIAG:  Muscle weakness (generalized)  Other lack of coordination  Rationale for  Evaluation and Treatment Rehabilitation  PERTINENT HISTORY:   R ankle pain, gout, T2DM, HTN, CKDIII; R/L carotid endarectomy  PRECAUTIONS: None  SUBJECTIVE: Pt. Reports doing well today  PAIN:  Are you having pain? 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 Pt. used her right hand to grasp, and store the design pieces, and perform translatory movements moving them from the palm to the tip of the digits prior to connecting the design pieces.  Pt. Worked on sustaining bilateral  shoulder elevation while connecting the pieces with her Right hand, and stabilizing the structure with the left hand. Pt. worked on bilateral hand coordination skills connecting smooth 1/2" connector beads.    Pt. presents with limited right hand [facility] skills, and continues to present with difficulty performing translatory movements with the right hand. Pt. Occasionally dropped pegs from the tips of her digits on her right hand when connecting the pieces higher up on the structure. Pt. presented with difficulty grasping, and manipulating the the connecter beads with the tips of the digits on her bilateral hands. Pt. Continues to work on improving RUE [facility], and hand functioning in order to work towards improving, and maximizing independence with ADLs, and IADLs.   PATIENT EDUCATION: Education details: Right hand motor control, and coordination skills. Person educated: Patient Education method: Explanation, Demonstration, Tactile cues, and Handouts Education comprehension: verbalized understanding, returned demonstration, verbal cues required, and needs further education   HOME EXERCISE PROGRAM  Pt. to continue with current HEPs with ongoing updates as indicated   OT Short Term Goals - 03/23/21 1657       OT SHORT TERM GOAL #1   Title Pt will perform HEP for RUE strength/coordination independently.    Baseline Eval: HEP not yet established in outpatient, but pt is working with putty from [name]; 12/04/2020; [facility] HEP initiated this day with mod vc after return demo; 01/05/2021: indep with currently program, but ongoing as pt progresses; 01/28/2021: vc to revisit and focus on grip and pinch strengthening with putty; 03/23/21: pt is indep with HEP    Time 6    Period Weeks    Status Achieved    Target Date 03/23/21              OT Long Term Goals - 10/15/21 1322       OT LONG TERM GOAL #1   Title Pt will improve hand writing to 100% legibility with R hand to be able to independently write a  check.    Baseline Eval: signature is 75% legible with R hand, requiring extra time; 12/04/2020: no chan to complete.ge from eval; 01/05/2021: Printing is 100% legible, signature is 90% legible, but still requires extra time and effort.; 01/26/21: Signature is 90% legible and speed/fluidity have improved. 20th visit: 90% legible, 03/23/21: 90% legible, extra time 40th visit 90 % legibility with increased time, 50th visit: 90% legible with with increased time required 07/14/2021: 90% legibility. 07/30/2021: 90% legibility, pt. presses too hard through the pen, and pencil. 09/03/2021: 90% legibility; 10/06/21: Increased time, but pt states she signs cards and writes short notes on a card sufficiently.  Pt states she has no need to write checks anymore as her family assists with this and she has no desire to return to this, but she would like to work on her pressure modulation.  New goal below. 10/15/2021: Pt. conitnues to require increased time to  complete note cards, and corespondence.    Time 12    Period Weeks    Status Partially Met    Target Date 12/28/21      OT LONG TERM GOAL #2   Title Pt will improve R hand coordination to enable good manipulation of iphone using R dominant hand      OT LONG TERM GOAL #3   Title Pt will improve GMC throughout RUE to enable pt to reach for and pick up ADL supplies with R dominant hand without dropping or knocking over objects.    Baseline Eval: Pt reports RUE is clumsy and easily knocks over objects when trying to reach for ADL supplies.  Pt verbalizes that her "aim is off."; 12/04/2020: pt reports slight improvement since eval and has started using her R hand to eat, but still feels clumsy; 01/05/2021: Greatly improved; pt is using silverware to eat with R hand, can comb hair with RUE, still mildly ataxic; 01/26/21: pt reports difficulty picking up objects within a small space (ie; may knock the toothpaste holder down when reaching for toothbrush), but reaching for  light/larger objects is improving 20th visit: pt. continues to be mildly ataxic, however is consistently improving with motor control, and [place] while reaching targets; 03/23/21: Pt reaches with accuracy towards targets if she takes her time (pt drops and knocks things over when moving at a faster/normal pace) 04/23/2021: Pt. continues to present with difficulty with depth perception, and impaired [place].40th visit: Pt. is improving, however tends to knock over lighter weight objects.50th visit: Pt. has improved with reaching for items without knocking them over, however continues to need work on accuracy.07/14/2021: Pt. is improving while motor control, and accuracy of reaching for objects. Pt. continues to work towards improving efficiency with reaching for objects. 07/30/2021: Pt. is able to able to reach for objects more accurately, however continues to present with limited motor control in the Right. 09/03/2021: Pt. Pt. is improving with reaching up without knocking items over. pt. is now able to stand at the sink and reach up without dropping items; 10/06/21: pt estimates she reaches with 85-90% accurate with RUE, occasional dropping. 10/15/2021: Pt. reports 85-90% accureacy, however presents with decreased accuracy the higher, or lower she reaches.    Time 12    Period Weeks    Status On-going    Target Date 12/28/21      OT LONG TERM GOAL #4   Title Pt will improve FOTO score to 68 or better to indicate measurable functional improvement.    Baseline Eval: FOTO 55; 01/05/2021: FOTO 61; 01/28/21: FOTO 61, 20th visit: 61; 03/23/21: FOTO 60 4oth visit: FOTO score: 61, 50th visit: FOTO 69 07/30/2021: FOTO 71  09/03/2021: FOTO 73; 10/06/21: 73 10/15/2021: 77    Time 12    Period Weeks    Status On-going      OT LONG TERM GOAL #6   Title Pt. will improve right hand [facility] skills to be able to indepedendently manipulate and set-up her medication/pills in the pillbox.    Baseline 07/14/2021: Pt. has difficulty  manipulating small pills, and often drops them. 07/30/2021: Pt. continues to have difficulty grasping pills/medication for set-up of pillbox. 09/03/2021: Pt. is able to grasp pills from the pillbox without dropping them; 10/06/21: modified indep (with extra time to complete). 10/15/2021: Pt. reports requiring increased time to complete.    Time 12    Period Weeks    Status Partially Met    Target Date 12/28/21  OT LONG TERM GOAL #7   Title Pt will improve accuracy with graded force through RUE during functional tasks per self report.    Baseline 10/06/21: Pt reports she holds her greatgrandchild too tightly, scratches her arm too forcefully, and sometimes writes with too much pressure; pt struggles to grade force for functional activities.10/15/2021: Pt. continues to grasp items with the left hand too tightly.    Time 12    Period Weeks    Status Achieved              Plan - 10/22/21 1311     Clinical Impression Statement Pt. presents with limited right hand [facility] skills, and continues to present with difficulty performing translatory movements with the right hand. Pt. Occasionally dropped pegs from the tips of her digits on her right hand when connecting the pieces higher up on the structure. Pt. presented with difficulty grasping, and manipulating the the connecter beads with the tips of the digits on her bilateral hands. Pt. Continues to work on improving RUE [facility], and hand functioning in order to work towards improving, and maximizing independence with ADLs, and IADLs.        OT Occupational Profile and History Detailed Assessment- Review of Records and additional review of physical, cognitive, psychosocial history related to current functional performance    Occupational performance deficits (Please refer to evaluation for details): ADL's;Leisure;IADL's    Body Structure / Function / Physical Skills ADL;Coordination;Endurance;GMC;UE functional use;Balance;Sensation;Body  mechanics;IADL;Pain;Dexterity;FMC;Strength;Gait;Mobility    Rehab Potential Good    Clinical Decision Making Several treatment options, min-mod task modification necessary    Comorbidities Affecting Occupational Performance: May have comorbidities impacting occupational performance    Modification or Assistance to Complete Evaluation  Min-Moderate modification of tasks or assist with assess necessary to complete eval    OT Frequency 2x / week    OT Duration 12 weeks    OT Treatment/Interventions Self-care/ADL training;Therapeutic exercise;DME and/or AE instruction;Functional Mobility Training;Balance training;Neuromuscular education;Therapeutic activities;Patient/family education    Family Member Consulted daughter, Emi Holes [place], MS, OTR/L  Harrel Carina, OT 11/19/2021, 1:15 PM

## 2021-11-19 NOTE — Therapy (Signed)
OUTPATIENT PHYSICAL THERAPY TREATMENT NOTE    Patient Name: Cassandra Schaefer MRN: [id number] DOB:[birth year], 88 y.o., female Today's Date: 11/19/2021  PCP: Roetta Sessions MD REFERRING PROVIDER: Roetta Sessions MD   PT End of Session - 11/19/21 1349     Visit Number 89    Number of Visits 102    Date for PT Re-Evaluation 12/17/21    Authorization Type [employer]    Authorization Time Period 10/22/21-12/17/21    Progress Note Due on Visit 90    PT Start Time 1348    PT Stop Time 1430    PT Time Calculation (min) 42 min    Equipment Utilized During Treatment Gait belt    Activity Tolerance Patient tolerated treatment well;No increased pain    Behavior During Therapy WFL for tasks assessed/performed              Past Medical History:  Diagnosis Date   Anemia    Cough    RESOLVING, NO FEVER   Diabetes mellitus without complication ([place])    Gout    Hypertension    Hypothyroidism    PUD (peptic ulcer disease)    Stroke [facility])    Wears dentures    full upper, partial lower   Past Surgical History:  Procedure Laterality Date   AMPUTATION TOE Left 12/14/2017   Procedure: AMPUTATION TOE-4TH MPJ;  Surgeon: Samara Deist, DPM;  Location: [facility];  Service: Podiatry;  Laterality: Left;  IVA LOCAL Diabetic - oral meds   APPENDECTOMY     CATARACT EXTRACTION W/PHACO Right 06/23/2015   Procedure: CATARACT EXTRACTION PHACO AND INTRAOCULAR LENS PLACEMENT (IOC);  Surgeon: Estill Cotta, MD;  Location: [facility];  Service: Ophthalmology;  Laterality: Right;  [place] 01:28 AP% 23.4 CDE 36.14 fluid pack lot # [id number]   CATARACT EXTRACTION W/PHACO Left 07/28/2015   Procedure: CATARACT EXTRACTION PHACO AND INTRAOCULAR LENS PLACEMENT (IOC);  Surgeon: Estill Cotta, MD;  Location: [facility];  Service: Ophthalmology;  Laterality: Left;  [place]    1:29.7 AP%  24.9 CDE   41.32 fluid casette lot #[id number] H exp 09/23/2016   COONOSCOPY     AND ENDOSCOPY   DILATION AND CURETTAGE OF UTERUS      ENDARTERECTOMY Left 11/13/2020   Procedure: Evacuation of left neck hematoma;  Surgeon: Katha Cabal, MD;  Location: [facility];  Service: Vascular;  Laterality: Left;   ENDARTERECTOMY Left 11/13/2020   Procedure: ENDARTERECTOMY CAROTID;  Surgeon: Algernon Huxley, MD;  Location: [facility];  Service: Vascular;  Laterality: Left;   TONSILLECTOMY     TUBAL LIGATION     Patient Active Problem List   Diagnosis Date Noted   Carotid stenosis, symptomatic, with infarction ([place]) 11/13/2020   Gout flare 11/10/2020   UTI (urinary tract infection) 11/10/2020   Left carotid artery stenosis 11/10/2020   Chronic kidney disease 11/10/2020   Left basal ganglia embolic stroke ([place]) 56/21/3086   PAC (premature atrial contraction)    Pre-procedural cardiovascular examination    Ischemic stroke ([place]) 10/20/2020   TIA (transient ischemic attack) 10/19/2020   Right hemiparesis ([place]) 10/19/2020   Type 2 diabetes mellitus with stage 3 chronic kidney disease, without long-term current use of insulin ([place]) 08/24/2018   Hyperlipidemia, unspecified 07/19/2017   Hypertension 07/19/2017   PUD (peptic ulcer disease) 07/19/2017   Type 2 diabetes mellitus ([place]) 07/19/2017   Personal history of gout 08/12/2016   Anemia, unspecified 04/09/2016   Hypothyroidism, acquired 12/10/2015    REFERRING DIAG: CVA with right  hemiparesis  THERAPY DIAG:  Muscle weakness (generalized)  Other lack of coordination  Unsteadiness on feet  Other abnormalities of gait and mobility  Abnormality of gait and mobility  Apraxia  Difficulty in walking, not elsewhere classified  Rationale for Evaluation and Treatment Rehabilitation  PERTINENT HISTORY: 88 y.o. female with medical history significant for type 2 diabetes mellitus, essential hypertension, acquired hypothyroidism, hyperlipidemia, stage III chronic kidney disease reports increased right sided weakness on 10/19/20. She was diagnosed with left basal ganglia ischemic  stroke. She was discharged to inpatient rehab for 10 days and then discharged home. Patient was evaluated by outpatient PT on 11/12/20. CVA was due to carotid stenosis. She underwent left carotid endartectomy on 7/21. Procedure went well however patient suffered from left cervical hematoma and had subsequent surgical procedure to stop the bleed and remove the hematoma on 11/13/20. They were concerned with her breathing and therefore intubated her for 1 day, she was in ICU for 2 days and transferred to med/surge on 11/15/20. Patient was discharged home on 11/17/20 and is now returning to outpatient PT. She has had the stiches removed and is healing well. Denies any neck pain or stiffness. She lives with her daughter and has 24/7 caregiver support. She is still using RW for most ambulation. She is able to transfer to bedside commode well and is able to stand short time unsupported. She reports feeling very weak and fatigued. She reports she has been dragging her right foot more. She denies any numbness/tingling; She reports feeling like her legs are wanting to do more but is hesitant because of fear of falling. She is mod I for self care morning routine. She does still receive help with showering. She has been working on increasing her activity but denies any formal exercise.   PRECAUTIONS: Fall risk  SUBJECTIVE: Pt reports doing well with no significant changes since last session. She did see her vascular doctor who said that her carotids are doing well- no blockages. She doesn't have to go back for 1 year; She is going to see her PCP first of August;   PAIN:  Are you having pain? No   TODAY'S TREATMENT: 11/19/2021   BP at start of session: 160/55,  There Ex:  Sit<>Stand from chair with arms extended 2x5  Standing on 1/2 bolster (round side up): -toe raises x15 reps; -heel raises x15 reps; Required intermittent rail assist for safety;   Leg press: BLE 55# 2x12 with good tolerance- reports moderate  challenge but no pain;   Forward step ups on 6 inch step with contralateral hip flexion against green tband x10 reps each LE. with 2 rail assist  required CGA for safety, also required cues for forward weight shift for better step negotiation; Pt reports significant difficulty requiring increased time/effort;  BP 167/71  NMR:  Forward/backward stepping over orange hurdle: With 1-0 rail assist x10 reps with min A for safety; does occasionally mis-step with RLE; She had a harder time stepping backward requiring 1 HHA when stepping back;   Forward walking with high march x30 feet Backward walking x20 feet Side stepping x20 feet x1 lap each direction;   Standing on airex beam: -side stepping x3 laps each direction with intermittent rail assist -tandem stance 10 sec hold x4 reps each foot in front with 1-0 rail assist, does exhibit wobble but able to maintain balance with min A for safety; -feet apart: single UE ball pass side/side x5 reps each direction, pt required CGA for safety;  Pt educated throughout session about proper posture and technique with exercises. Improved exercise technique, movement at target joints, use of target muscles after min to mod verbal, visual, tactile cues.    Pt required occasional rest breaks due fatigue and likely increase in blood pressure, PT was quick to ask when pt appeared to be fatiguing in order to prevent excessive fatigue.     PATIENT EDUCATION: Education details: Exercise technique/positioning; HEP Person educated: Patient Education method: Explanation, Demonstration, and Verbal cues Education comprehension: verbalized understanding, returned demonstration, and needs further education   HOME EXERCISE PROGRAM: No changes this session;   Instructed patient in pool exercise: Access Code: [email or web address] URL: [email or web address] Date: 10/22/2021 Prepared by: Blanche East  Exercises - Seated Knee Extension with Resistance  - 1  x daily - 7 x weekly - 2 sets - 15 reps - Forward and Backward Stepping at [employer]  - 1 x daily - 7 x weekly - 1 sets - 5 reps - Freestyle Swimming with Snorkel Mask  - 1 x daily - 7 x weekly - 1 sets - 2-3 reps - Heel Walking  - 1 x daily - 7 x weekly - 1 sets - 5 reps - Toe Walking  - 1 x daily - 7 x weekly - 1 sets - 5 reps - Single Leg Stance at Pool Wall  - 1 x daily - 7 x weekly - 1 sets - 3-5 reps - 15-30 sec hold   PT Short Term Goals -       PT SHORT TERM GOAL #1   Title Patient will be adherent to HEP at least 3x a week to improve functional strength and balance for better safety at home.    Baseline 9/21: Pt reports compliance with HEP and is confident, 1/3: doing exercise 2x a week; 06/25/21: 3x/week, 5/18: consistent;    Time 4    Period Weeks    Status Achieved    Target Date 06/25/21      PT SHORT TERM GOAL #2   Title Patient will be independent in transferring sit<>Stand without pushing on arm rests to improve ability to get up from chair.    Baseline 9/21: pt able to complete STS without use of UEs, 1/3:able to stand but has moderate difficulty requiring increased time; 06/25/21: able to perform indep without hands but requiring use of momentum to attain standing. Remains stable. 5/18: able to stand without UE assist, close supervision, remains stable;    Time 4    Period Weeks    Status Achieved    Target Date 07/23/21              PT Long Term Goals -      PT LONG TERM GOAL #1   Title Patient (> 75 years old) will complete five times sit to stand test in < 15 seconds indicating an increased LE strength and improved balance.    Baseline 9/21: 29 seconds hands-free 10/10: deferred 10/12: 34 sec hands free; 11/2: 23.8 sec hands-free; 12/7: 33.2 sec hands free, 1/3: 33.92 hands free; 06/25/21: 25.69 sec, 4/13: 15.5 sec hands free, 5/18: 23 sec without UE 6/22: deferred   Time 8    Period Weeks    Status Partially Met    Target Date  12/17/21     PT LONG TERM GOAL  #2   Title Patient will increase six minute walk test distance to >400 feet for improve gait ability    Baseline 9/21: 368 ft  with RW; 10/10: deferred 10/12: 408 ft; 11/2: 476 ft with 4WW, 1/3: 475 feet with RW; 4/13: 450 feet without AD CGA 10/15/21: 460 feet without AD, CGA   Time 8    Period Weeks    Status Achieved    Target Date 12/17/21     PT LONG TERM GOAL #3   Title Patient will increase 10 meter walk test to >1.36ms as to improve gait speed for better community ambulation and to reduce fall risk.    Baseline 9/21: 0.5 m/s with RW; 10/10 deferred 10/12: 0.93 m/s with 46DJ 11/2: 0.83 m/s with 44HF 12/7: 0.52 m/s with RW, 1/3: 0.704; 06/25/21: 0.55 m/s with SPC. 5/18: 0.4 m/s without AD , 10/15/21 0.507 m/s without AD   Time 8    Period Weeks    Status Partially Met   Target Date 12/17/21     PT LONG TERM GOAL #4   Title Patient will be independent with ascend/descend 6 steps using single UE in step over step pattern without LOB.    Baseline 9/21:  Patient uses PT stairs (4 steps) with bilateral upper extremity support on handrails for both ascending/descending.  Patient exhibits reciprocal pattern ascending and step to pattern descending. 10/10: deferred; 10/12: ascending/descending recip. steps with BUE support; 11/2: Ascended/descended 8 steps total, reciprocal pattern ascending and step-to descending. Pt uses UUE support throughout with CGA assist.; 12/7: Ascended and descended 8 steps total with UUE support and CGA. Reciprocal stepping ascending and step to descending., 1/3: Deferrred; 06/25/21: able to perform with SUE support and reciprocal pattern asc/desc, but does require close CGA with descending. 5/18: independent with 2 handrails, close supervision with 1 handrai; 10/15/21: requires 2 handrails   Time 8    Period Weeks    Status Partially Met    Target Date 12/17/21     PT LONG TERM GOAL #5   Title Patient will demonstrate an improved Berg Balance Score of >45/56 as to  demonstrate improved balance with ADLs such as sitting/standing and transfer balance and reduced fall risk.    Baseline 9/21: deferred d/t time; 9/29: 42/56; 10/10: deferred; 10/12: 44/56; 11/2: deferred; 11/7: 46/56, 1/3: deferred    Time 8    Period Weeks    Status Achieved    Target Date 12/17/21     PT LONG TERM GOAL #6   Title Patient will improve FOTO score to >60% to indicate improved functional mobility with ADLs.    Baseline 9/21: 59; 10/10: 57; 11/2: 60; 12/7: 58%, 1/3: 61%, 4/13: 56% 10/15/21: 58%   Time 8    Period Weeks    Status Partially Met   Target Date 12/17/21     PT LONG TERM GOAL #7   Title Pt will improve FGA by a minimum of 5 points to indicate clinically significant improvement in reduced risk of falls with walking tasks.    Baseline 06/25/21: 12, 4/13: deferred, 5/18: deferred due to increased ankle discomfort; 6/22: deferred due to elevated BP- will assess next visit; 10/20/21: 8/30   Time 8    Period Weeks    Status Not met   Target Date 12/17/21             Plan -    Clinical Impression Statement Patient motivated and participated well within session. She was instructed in advanced LE strengthening. Progressed intensity of exercise with less rail assist to challenge dynamic balance and stance control. Patient reports increased challenge with less rail assist but was able  to complete with CGA to min A from therapist. She does exhibit some instability when standing on airex beam (compliant surface) but was able to keep balance with ankle strategies. She does require CGA to min A throughout for safety; Patient is improving, however does continue to have some apraxia with decreased control of RLE when negotiating obstacles. Patient would benefit from additional skilled PT intervention to improve strength, balance and gait safety;      Personal Factors and Comorbidities Age    Comorbidities HTN, CKD, fall risk, type 2 diabetes,    Examination-Activity  Limitations Lift;Locomotion Level;Squat;Stairs;Stand;Toileting;Transfers;Bathing    Examination-Participation Restrictions Cleaning;Community Activity;Laundry;Meal Prep;Shop;Volunteer;Driving    Stability/Clinical Decision Making Stable/Uncomplicated    Rehab Potential Fair    PT Frequency 2x / week    PT Duration 8 weeks    PT Treatment/Interventions Cryotherapy;Electrical Stimulation;Moist Heat;Gait training;Stair training;Functional mobility training;Therapeutic activities;Therapeutic exercise;Neuromuscular re-education;Balance training;Patient/family education;Orthotic Fit/Training;Energy conservation    PT Next Visit Plan work on LE strengthening and balance; gait training, endurance, continue POC as previously indicated    PT Home Exercise Plan No changes this date    Consulted and Agree with Plan of Care Patient                 Shyquan Stallbaumer, PT, DPT 11/19/2021, 2:33 PM

## 2021-11-24 ENCOUNTER — Encounter: Payer: Self-pay | Admitting: Physical Therapy

## 2021-11-24 ENCOUNTER — Ambulatory Visit: Payer: Medicare HMO | Attending: Vascular Surgery | Admitting: Physical Therapy

## 2021-11-24 ENCOUNTER — Ambulatory Visit: Payer: Medicare HMO | Admitting: Occupational Therapy

## 2021-11-24 DIAGNOSIS — R262 Difficulty in walking, not elsewhere classified: Secondary | ICD-10-CM | POA: Insufficient documentation

## 2021-11-24 DIAGNOSIS — R2681 Unsteadiness on feet: Secondary | ICD-10-CM | POA: Insufficient documentation

## 2021-11-24 DIAGNOSIS — R4701 Aphasia: Secondary | ICD-10-CM | POA: Diagnosis present

## 2021-11-24 DIAGNOSIS — R278 Other lack of coordination: Secondary | ICD-10-CM | POA: Insufficient documentation

## 2021-11-24 DIAGNOSIS — R482 Apraxia: Secondary | ICD-10-CM | POA: Insufficient documentation

## 2021-11-24 DIAGNOSIS — R2689 Other abnormalities of gait and mobility: Secondary | ICD-10-CM | POA: Diagnosis present

## 2021-11-24 DIAGNOSIS — R269 Unspecified abnormalities of gait and mobility: Secondary | ICD-10-CM | POA: Diagnosis present

## 2021-11-24 DIAGNOSIS — I639 Cerebral infarction, unspecified: Secondary | ICD-10-CM | POA: Diagnosis present

## 2021-11-24 DIAGNOSIS — R471 Dysarthria and anarthria: Secondary | ICD-10-CM | POA: Diagnosis present

## 2021-11-24 DIAGNOSIS — M6281 Muscle weakness (generalized): Secondary | ICD-10-CM | POA: Diagnosis present

## 2021-11-24 NOTE — Therapy (Signed)
OUTPATIENT OCCUPATIONAL THERAPY TREATMENT NOTE   Patient Name: Cassandra Schaefer MRN: [id number] DOB:07/21/1928, 88 y.o., female Today's Date: 11/24/2021   REFERRING PROVIDER: Tracie Harrier, MD    OT End of Session - 11/24/21 1350     Visit Number 35    Number of Visits 114    Date for OT Re-Evaluation 12/28/21    Authorization Time Period Reporting period starting 09/03/2021    OT Start Time 1300    OT Stop Time 1345    OT Time Calculation (min) 45 min    Equipment Utilized During Treatment tranport chair    Activity Tolerance Patient tolerated treatment well    Behavior During Therapy WFL for tasks assessed/performed                    Past Medical History:  Diagnosis Date   Anemia    Cough    RESOLVING, NO FEVER   Diabetes mellitus without complication ([place])    Gout    Hypertension    Hypothyroidism    PUD (peptic ulcer disease)    Stroke [facility])    Wears dentures    full upper, partial lower   Past Surgical History:  Procedure Laterality Date   AMPUTATION TOE Left 12/14/2017   Procedure: AMPUTATION TOE-4TH MPJ;  Surgeon: Samara Deist, DPM;  Location: [place];  Service: Podiatry;  Laterality: Left;  IVA LOCAL Diabetic - oral meds   APPENDECTOMY     CATARACT EXTRACTION W/PHACO Right 06/23/2015   Procedure: CATARACT EXTRACTION PHACO AND INTRAOCULAR LENS PLACEMENT (IOC);  Surgeon: Estill Cotta, MD;  Location: [facility];  Service: Ophthalmology;  Laterality: Right;  [place] 01:28 AP% 23.4 CDE 36.14 fluid pack lot # [id number]   CATARACT EXTRACTION W/PHACO Left 07/28/2015   Procedure: CATARACT EXTRACTION PHACO AND INTRAOCULAR LENS PLACEMENT (IOC);  Surgeon: Estill Cotta, MD;  Location: [facility];  Service: Ophthalmology;  Laterality: Left;  [place]    1:29.7 AP%  24.9 CDE   41.32 fluid casette lot #[id number] H exp 09/23/2016   COONOSCOPY     AND ENDOSCOPY   DILATION AND CURETTAGE OF UTERUS     ENDARTERECTOMY Left 11/13/2020   Procedure: Evacuation of  left neck hematoma;  Surgeon: Katha Cabal, MD;  Location: [facility];  Service: Vascular;  Laterality: Left;   ENDARTERECTOMY Left 11/13/2020   Procedure: ENDARTERECTOMY CAROTID;  Surgeon: Algernon Huxley, MD;  Location: [facility];  Service: Vascular;  Laterality: Left;   TONSILLECTOMY     TUBAL LIGATION     Patient Active Problem List   Diagnosis Date Noted   Carotid stenosis, symptomatic, with infarction ([place]) 11/13/2020   Gout flare 11/10/2020   UTI (urinary tract infection) 11/10/2020   Left carotid artery stenosis 11/10/2020   Chronic kidney disease 11/10/2020   Left basal ganglia embolic stroke ([place]) 00/71/2197   PAC (premature atrial contraction)    Pre-procedural cardiovascular examination    Ischemic stroke ([place]) 10/20/2020   TIA (transient ischemic attack) 10/19/2020   Right hemiparesis ([place]) 10/19/2020   Type 2 diabetes mellitus with stage 3 chronic kidney disease, without long-term current use of insulin ([place]) 08/24/2018   Hyperlipidemia, unspecified 07/19/2017   Hypertension 07/19/2017   PUD (peptic ulcer disease) 07/19/2017   Type 2 diabetes mellitus ([place]) 07/19/2017   Personal history of gout 08/12/2016   Anemia, unspecified 04/09/2016   Hypothyroidism, acquired 12/10/2015   REFERRING DIAG: CVA  THERAPY DIAG:  Muscle weakness (generalized)  Rationale for Evaluation and Treatment Rehabilitation  PERTINENT HISTORY:   R ankle pain, gout, T2DM, HTN, CKDIII; R/L carotid endarectomy  PRECAUTIONS: None  SUBJECTIVE: Pt. Reports doing well today  PAIN:  Are you having pain? No     OBJECTIVE:   TODAY'S TREATMENT:    Pt. worked on right hand [facility] skills grasping 1/2" thin [employer]ite pegs from a flat surface placed on a tabletop surface. Pt. Grasped the pegs, and moved the within the distal tips of her digits in preparation for placing them into the resistive handheld [employer]. Pt. Transitioned from placing them into a hand held [employer] to a  flat pegboard at the tabletop surface.  Pt. Worked on right hand [facility] skills in order to improve [facility] skills needed for manipulating small items for ADLs, and IADLs.   Pt. was able to grasp the 1/2" [employer]ite pegs with her right hand. Pt. had difficulty changing the position of the pegs within the distal tips of her digits often dropping several when trying to press them into the [employer]. Pt. dropped fewer pegs when placing the pegs into a flat pegboard. Pt. continues to work on improving RUE [facility], and hand functioning in order to work towards improving, and maximizing independence with ADLs, and IADL tasks.   PATIENT EDUCATION: Education details: Right hand motor control, and coordination skills. Person educated: Patient Education method: Explanation, Demonstration, Tactile cues, and Handouts Education comprehension: verbalized understanding, returned demonstration, verbal cues required, and needs further education   HOME EXERCISE PROGRAM  Pt. to continue with current HEPs with ongoing updates as indicated   OT Short Term Goals - 03/23/21 1657       OT SHORT TERM GOAL #1   Title Pt will perform HEP for RUE strength/coordination independently.    Baseline Eval: HEP not yet established in outpatient, but pt is working with putty from [name]; 12/04/2020; [facility] HEP initiated this day with mod vc after return demo; 01/05/2021: indep with currently program, but ongoing as pt progresses; 01/28/2021: vc to revisit and focus on grip and pinch strengthening with putty; 03/23/21: pt is indep with HEP    Time 6    Period Weeks    Status Achieved    Target Date 03/23/21              OT Long Term Goals - 10/15/21 1322       OT LONG TERM GOAL #1   Title Pt will improve hand writing to 100% legibility with R hand to be able to independently write a check.    Baseline Eval: signature is 75% legible with R hand, requiring extra time; 12/04/2020: no chan to complete.ge from eval; 01/05/2021: Printing  is 100% legible, signature is 90% legible, but still requires extra time and effort.; 01/26/21: Signature is 90% legible and speed/fluidity have improved. 20th visit: 90% legible, 03/23/21: 90% legible, extra time 40th visit 90 % legibility with increased time, 50th visit: 90% legible with with increased time required 07/14/2021: 90% legibility. 07/30/2021: 90% legibility, pt. presses too hard through the pen, and pencil. 09/03/2021: 90% legibility; 10/06/21: Increased time, but pt states she signs cards and writes short notes on a card sufficiently.  Pt states she has no need to write checks anymore as her family assists with this and she has no desire to return to this, but she would like to work on her pressure modulation.  New goal below. 10/15/2021: Pt. conitnues to require increased time to complete note cards, and corespondence.  Time 12    Period Weeks    Status Partially Met    Target Date 12/28/21      OT LONG TERM GOAL #2   Title Pt will improve R hand coordination to enable good manipulation of iphone using R dominant hand      OT LONG TERM GOAL #3   Title Pt will improve GMC throughout RUE to enable pt to reach for and pick up ADL supplies with R dominant hand without dropping or knocking over objects.    Baseline Eval: Pt reports RUE is clumsy and easily knocks over objects when trying to reach for ADL supplies.  Pt verbalizes that her "aim is off."; 12/04/2020: pt reports slight improvement since eval and has started using her R hand to eat, but still feels clumsy; 01/05/2021: Greatly improved; pt is using silverware to eat with R hand, can comb hair with RUE, still mildly ataxic; 01/26/21: pt reports difficulty picking up objects within a small space (ie; may knock the toothpaste holder down when reaching for toothbrush), but reaching for light/larger objects is improving 20th visit: pt. continues to be mildly ataxic, however is consistently improving with motor control, and [place] while reaching  targets; 03/23/21: Pt reaches with accuracy towards targets if she takes her time (pt drops and knocks things over when moving at a faster/normal pace) 04/23/2021: Pt. continues to present with difficulty with depth perception, and impaired [place].40th visit: Pt. is improving, however tends to knock over lighter weight objects.50th visit: Pt. has improved with reaching for items without knocking them over, however continues to need work on accuracy.07/14/2021: Pt. is improving while motor control, and accuracy of reaching for objects. Pt. continues to work towards improving efficiency with reaching for objects. 07/30/2021: Pt. is able to able to reach for objects more accurately, however continues to present with limited motor control in the Right. 09/03/2021: Pt. Pt. is improving with reaching up without knocking items over. pt. is now able to stand at the sink and reach up without dropping items; 10/06/21: pt estimates she reaches with 85-90% accurate with RUE, occasional dropping. 10/15/2021: Pt. reports 85-90% accureacy, however presents with decreased accuracy the higher, or lower she reaches.    Time 12    Period Weeks    Status On-going    Target Date 12/28/21      OT LONG TERM GOAL #4   Title Pt will improve FOTO score to 68 or better to indicate measurable functional improvement.    Baseline Eval: FOTO 55; 01/05/2021: FOTO 61; 01/28/21: FOTO 61, 20th visit: 61; 03/23/21: FOTO 60 4oth visit: FOTO score: 61, 50th visit: FOTO 69 07/30/2021: FOTO 71  09/03/2021: FOTO 73; 10/06/21: 73 10/15/2021: 77    Time 12    Period Weeks    Status On-going      OT LONG TERM GOAL #6   Title Pt. will improve right hand [facility] skills to be able to indepedendently manipulate and set-up her medication/pills in the pillbox.    Baseline 07/14/2021: Pt. has difficulty manipulating small pills, and often drops them. 07/30/2021: Pt. continues to have difficulty grasping pills/medication for set-up of pillbox. 09/03/2021: Pt. is able to  grasp pills from the pillbox without dropping them; 10/06/21: modified indep (with extra time to complete). 10/15/2021: Pt. reports requiring increased time to complete.    Time 12    Period Weeks    Status Partially Met    Target Date 12/28/21      OT LONG TERM GOAL #  7   Title Pt will improve accuracy with graded force through RUE during functional tasks per self report.    Baseline 10/06/21: Pt reports she holds her greatgrandchild too tightly, scratches her arm too forcefully, and sometimes writes with too much pressure; pt struggles to grade force for functional activities.10/15/2021: Pt. continues to grasp items with the left hand too tightly.    Time 12    Period Weeks    Status Achieved              Plan - 10/22/21 1311     Clinical Impression Statement Pt. was able to grasp the 1/2" [employer] pegs with her right hand. Pt. had difficulty changing the position of the pegs within the distal tips of her digits often dropping several when trying to press them into the [employer]. Pt. dropped fewer pegs when placing the pegs into a flat pegboard. Pt. continues to work on improving RUE [facility], and hand functioning in order to work towards improving, and maximizing independence with ADLs, and IADL tasks.         OT Occupational Profile and History Detailed Assessment- Review of Records and additional review of physical, cognitive, psychosocial history related to current functional performance    Occupational performance deficits (Please refer to evaluation for details): ADL's;Leisure;IADL's    Body Structure / Function / Physical Skills ADL;Coordination;Endurance;GMC;UE functional use;Balance;Sensation;Body mechanics;IADL;Pain;Dexterity;FMC;Strength;Gait;Mobility    Rehab Potential Good    Clinical Decision Making Several treatment options, min-mod task modification necessary    Comorbidities Affecting Occupational Performance: May have comorbidities impacting occupational performance     Modification or Assistance to Complete Evaluation  Min-Moderate modification of tasks or assist with assess necessary to complete eval    OT Frequency 2x / week    OT Duration 12 weeks    OT Treatment/Interventions Self-care/ADL training;Therapeutic exercise;DME and/or AE instruction;Functional Mobility Training;Balance training;Neuromuscular education;Therapeutic activities;Patient/family education    Family Member Consulted daughter, Emi Holes [place], MS, OTR/L  Harrel Carina, OT 11/24/2021, 1:54 PM

## 2021-11-24 NOTE — Therapy (Signed)
OUTPATIENT PHYSICAL THERAPY TREATMENT NOTE Physical Therapy Progress Note   Dates of reporting period  10/15/21   to   11/24/21     Patient Name: Cassandra Schaefer MRN: [id number] DOB:[birth year], 88 y.o., female Today's Date: 11/24/2021  PCP: Roetta Sessions MD REFERRING PROVIDER: Roetta Sessions MD   PT End of Session - 11/24/21 1355     Visit Number 90    Number of Visits 102    Date for PT Re-Evaluation 12/17/21    Authorization Type [employer]    Authorization Time Period 10/22/21-12/17/21    Progress Note Due on Visit 90    PT Start Time 1350    PT Stop Time 1430    PT Time Calculation (min) 40 min    Equipment Utilized During Treatment Gait belt    Activity Tolerance Patient tolerated treatment well;No increased pain    Behavior During Therapy WFL for tasks assessed/performed              Past Medical History:  Diagnosis Date   Anemia    Cough    RESOLVING, NO FEVER   Diabetes mellitus without complication ([place])    Gout    Hypertension    Hypothyroidism    PUD (peptic ulcer disease)    Stroke [facility])    Wears dentures    full upper, partial lower   Past Surgical History:  Procedure Laterality Date   AMPUTATION TOE Left 12/14/2017   Procedure: AMPUTATION TOE-4TH MPJ;  Surgeon: Samara Deist, DPM;  Location: [place];  Service: Podiatry;  Laterality: Left;  IVA LOCAL Diabetic - oral meds   APPENDECTOMY     CATARACT EXTRACTION W/PHACO Right 06/23/2015   Procedure: CATARACT EXTRACTION PHACO AND INTRAOCULAR LENS PLACEMENT (IOC);  Surgeon: Estill Cotta, MD;  Location: [facility];  Service: Ophthalmology;  Laterality: Right;  [place] 01:28 AP% 23.4 CDE 36.14 fluid pack lot # [id number]   CATARACT EXTRACTION W/PHACO Left 07/28/2015   Procedure: CATARACT EXTRACTION PHACO AND INTRAOCULAR LENS PLACEMENT (IOC);  Surgeon: Estill Cotta, MD;  Location: [facility];  Service: Ophthalmology;  Laterality: Left;  [place]    1:29.7 AP%  24.9 CDE   41.32 fluid casette lot #[id number]  exp 09/23/2016   COONOSCOPY     AND ENDOSCOPY   DILATION AND CURETTAGE OF UTERUS     ENDARTERECTOMY Left 11/13/2020   Procedure: Evacuation of left neck hematoma;  Surgeon: Katha Cabal, MD;  Location: [facility];  Service: Vascular;  Laterality: Left;   ENDARTERECTOMY Left 11/13/2020   Procedure: ENDARTERECTOMY CAROTID;  Surgeon: Algernon Huxley, MD;  Location: [facility];  Service: Vascular;  Laterality: Left;   TONSILLECTOMY     TUBAL LIGATION     Patient Active Problem List   Diagnosis Date Noted   Carotid stenosis, symptomatic, with infarction ([place]) 11/13/2020   Gout flare 11/10/2020   UTI (urinary tract infection) 11/10/2020   Left carotid artery stenosis 11/10/2020   Chronic kidney disease 11/10/2020   Left basal ganglia embolic stroke ([place]) 30/94/0768   PAC (premature atrial contraction)    Pre-procedural cardiovascular examination    Ischemic stroke ([place]) 10/20/2020   TIA (transient ischemic attack) 10/19/2020   Right hemiparesis ([place]) 10/19/2020   Type 2 diabetes mellitus with stage 3 chronic kidney disease, without long-term current use of insulin ([place]) 08/24/2018   Hyperlipidemia, unspecified 07/19/2017   Hypertension 07/19/2017   PUD (peptic ulcer disease) 07/19/2017   Type 2 diabetes mellitus ([place]) 07/19/2017   Personal history of gout  08/12/2016   Anemia, unspecified 04/09/2016   Hypothyroidism, acquired 12/10/2015    REFERRING DIAG: CVA with right hemiparesis  THERAPY DIAG:  Muscle weakness (generalized)  Other lack of coordination  Unsteadiness on feet  Other abnormalities of gait and mobility  Abnormality of gait and mobility  Apraxia  Difficulty in walking, not elsewhere classified  Rationale for Evaluation and Treatment Rehabilitation  PERTINENT HISTORY: 88 y.o. female with medical history significant for type 2 diabetes mellitus, essential hypertension, acquired hypothyroidism, hyperlipidemia, stage III chronic kidney disease reports increased  right sided weakness on 10/19/20. She was diagnosed with left basal ganglia ischemic stroke. She was discharged to inpatient rehab for 10 days and then discharged home. Patient was evaluated by outpatient PT on 11/12/20. CVA was due to carotid stenosis. She underwent left carotid endartectomy on 7/21. Procedure went well however patient suffered from left cervical hematoma and had subsequent surgical procedure to stop the bleed and remove the hematoma on 11/13/20. They were concerned with her breathing and therefore intubated her for 1 day, she was in ICU for 2 days and transferred to med/surge on 11/15/20. Patient was discharged home on 11/17/20 and is now returning to outpatient PT. She has had the stiches removed and is healing well. Denies any neck pain or stiffness. She lives with her daughter and has 24/7 caregiver support. She is still using RW for most ambulation. She is able to transfer to bedside commode well and is able to stand short time unsupported. She reports feeling very weak and fatigued. She reports she has been dragging her right foot more. She denies any numbness/tingling; She reports feeling like her legs are wanting to do more but is hesitant because of fear of falling. She is mod I for self care morning routine. She does still receive help with showering. She has been working on increasing her activity but denies any formal exercise.   PRECAUTIONS: Fall risk  SUBJECTIVE: Pt reports doing well with no significant changes since last session. She did see her vascular doctor who said that her carotids are doing well- no blockages. She doesn't have to go back for 1 year; She is going to see her PCP first of August;   PAIN:  Are you having pain? No   TODAY'S TREATMENT: 11/24/2021   BP at start of session: 160/55,  There Ex:  Standing on firm surface: Alternate march (high knee) x10 reps Hip circles x5 reps each LE Mini squat x10 reps Heel/toe raises x10 reps;   [facility] PT Assessment -  11/24/21 0001       Observation/Other Assessments   Focus on Therapeutic Outcomes (FOTO)  56%      Standardized Balance Assessment   Five times sit to stand comments  15.7 sec without UE assist, improved from 23 sec on 09/10/21    10 Meter Walk 0.64 m/s with SPC, limited home ambulator, improved from 0.5 m/s without AD on 10/15/21      Functional Gait  Assessment   Gait Level Surface Walks 20 ft, slow speed, abnormal gait pattern, evidence for imbalance or deviates 10-15 in outside of the 12 in walkway width. Requires more than 7 sec to ambulate 20 ft.    Change in Gait Speed Makes only minor adjustments to walking speed, or accomplishes a change in speed with significant gait deviations, deviates 10-15 in outside the 12 in walkway width, or changes speed but loses balance but is able to recover and continue walking.    Gait with  Horizontal Head Turns Performs head turns smoothly with slight change in gait velocity (eg, minor disruption to smooth gait path), deviates 6-10 in outside 12 in walkway width, or uses an assistive device.    Gait with Vertical Head Turns Performs task with moderate change in gait velocity, slows down, deviates 10-15 in outside 12 in walkway width but recovers, can continue to walk.    Gait and Pivot Turn Turns slowly, requires verbal cueing, or requires several small steps to catch balance following turn and stop    Step Over Obstacle Cannot perform without assistance.    Gait with Narrow Base of Support Ambulates less than 4 steps heel to toe or cannot perform without assistance.    Gait with Eyes Closed Cannot walk 20 ft without assistance, severe gait deviations or imbalance, deviates greater than 15 in outside 12 in walkway width or will not attempt task.    Ambulating Backwards Cannot walk 20 ft without assistance, severe gait deviations or imbalance, deviates greater than 15 in outside 12 in walkway width or will not attempt task.    Steps Alternating feet, must use  rail.    Total Score 8               PATIENT EDUCATION: Education details: Exercise technique/positioning; HEP Person educated: Patient Education method: Explanation, Demonstration, and Verbal cues Education comprehension: verbalized understanding, returned demonstration, and needs further education   HOME EXERCISE PROGRAM: No changes this session;   Instructed patient in pool exercise: Access Code: [email or web address] URL: [email or web address] Date: 10/22/2021 Prepared by: Blanche East  Exercises - Seated Knee Extension with Resistance  - 1 x daily - 7 x weekly - 2 sets - 15 reps - Forward and Backward Stepping at [employer]  - 1 x daily - 7 x weekly - 1 sets - 5 reps - Freestyle Swimming with Snorkel Mask  - 1 x daily - 7 x weekly - 1 sets - 2-3 reps - Heel Walking  - 1 x daily - 7 x weekly - 1 sets - 5 reps - Toe Walking  - 1 x daily - 7 x weekly - 1 sets - 5 reps - Single Leg Stance at Pool Wall  - 1 x daily - 7 x weekly - 1 sets - 3-5 reps - 15-30 sec hold   PT Short Term Goals -       PT SHORT TERM GOAL #1   Title Patient will be adherent to HEP at least 3x a week to improve functional strength and balance for better safety at home.    Baseline 9/21: Pt reports compliance with HEP and is confident, 1/3: doing exercise 2x a week; 06/25/21: 3x/week, 5/18: consistent; 8/1: consistent;    Time 4    Period Weeks    Status Achieved    Target Date 06/25/21      PT SHORT TERM GOAL #2   Title Patient will be independent in transferring sit<>Stand without pushing on arm rests to improve ability to get up from chair.    Baseline 9/21: pt able to complete STS without use of UEs, 1/3:able to stand but has moderate difficulty requiring increased time; 06/25/21: able to perform indep without hands but requiring use of momentum to attain standing. Remains stable. 5/18: able to stand without UE assist, close supervision, remains stable;    Time 4    Period Weeks    Status  Achieved    Target Date 07/23/21  PT Long Term Goals -      PT LONG TERM GOAL #1   Title Patient (> 86 years old) will complete five times sit to stand test in < 15 seconds indicating an increased LE strength and improved balance.    Baseline 9/21: 29 seconds hands-free 10/10: deferred 10/12: 34 sec hands free; 11/2: 23.8 sec hands-free; 12/7: 33.2 sec hands free, 1/3: 33.92 hands free; 06/25/21: 25.69 sec, 4/13: 15.5 sec hands free, 5/18: 23 sec without UE 6/22: deferred, 8/1: 15.7 sec without UE   Time 8    Period Weeks    Status Partially Met    Target Date  12/17/21     PT LONG TERM GOAL #2   Title Patient will increase six minute walk test distance to >400 feet for improve gait ability    Baseline 9/21: 368 ft with RW; 10/10: deferred 10/12: 408 ft; 11/2: 476 ft with 4WW, 1/3: 475 feet with RW; 4/13: 450 feet without AD CGA 10/15/21: 460 feet without AD, CGA   Time 8    Period Weeks    Status Achieved    Target Date 12/17/21     PT LONG TERM GOAL #3   Title Patient will increase 10 meter walk test to >1.73m/s as to improve gait speed for better community ambulation and to reduce fall risk.    Baseline 9/21: 0.5 m/s with RW; 10/10 deferred 10/12: 0.93 m/s with 7WG; 11/2: 0.83 m/s with 9FA; 12/7: 0.52 m/s with RW, 1/3: 0.704; 06/25/21: 0.55 m/s with SPC. 5/18: 0.4 m/s without AD , 10/15/21 0.507 m/s without AD, 8/1: 0.64 m/s   Time 8    Period Weeks    Status Partially Met   Target Date 12/17/21     PT LONG TERM GOAL #4   Title Patient will be independent with ascend/descend 6 steps using single UE in step over step pattern without LOB.    Baseline 9/21:  Patient uses PT stairs (4 steps) with bilateral upper extremity support on handrails for both ascending/descending.  Patient exhibits reciprocal pattern ascending and step to pattern descending. 10/10: deferred; 10/12: ascending/descending recip. steps with BUE support; 11/2: Ascended/descended 8 steps total, reciprocal  pattern ascending and step-to descending. Pt uses UUE support throughout with CGA assist.; 12/7: Ascended and descended 8 steps total with UUE support and CGA. Reciprocal stepping ascending and step to descending., 1/3: Deferrred; 06/25/21: able to perform with SUE support and reciprocal pattern asc/desc, but does require close CGA with descending. 5/18: independent with 2 handrails, close supervision with 1 handrai; 10/15/21: requires 2 handrails, 8/1: requires 2 handrails   Time 8    Period Weeks    Status Partially Met    Target Date 12/17/21     PT LONG TERM GOAL #5   Title Patient will demonstrate an improved Berg Balance Score of >45/56 as to demonstrate improved balance with ADLs such as sitting/standing and transfer balance and reduced fall risk.    Baseline 9/21: deferred d/t time; 9/29: 42/56; 10/10: deferred; 10/12: 44/56; 11/2: deferred; 11/7: 46/56, 1/3: deferred    Time 8    Period Weeks    Status Achieved    Target Date 12/17/21     PT LONG TERM GOAL #6   Title Patient will improve FOTO score to >60% to indicate improved functional mobility with ADLs.    Baseline 9/21: 59; 10/10: 57; 11/2: 60; 12/7: 58%, 1/3: 61%, 4/13: 56% 10/15/21: 58%   Time 8    Period  Weeks    Status Partially Met   Target Date 12/17/21     PT LONG TERM GOAL #7   Title Pt will improve FGA by a minimum of 5 points to indicate clinically significant improvement in reduced risk of falls with walking tasks.    Baseline 06/25/21: 12, 4/13: deferred, 5/18: deferred due to increased ankle discomfort; 6/22: deferred due to elevated BP- will assess next visit; 10/20/21: 8/30, 8/1: 8/30   Time 8    Period Weeks    Status Not met   Target Date 12/17/21             Plan -    Clinical Impression Statement Patient motivated and participated well within session. She was instructed in advanced LE strengthening. Progressed intensity of exercise with less rail assist to challenge dynamic balance and stance control.  Patient reports increased challenge with less rail assist but was able to complete with CGA to min A from therapist. She does exhibit some instability when standing on airex beam (compliant surface) but was able to keep balance with ankle strategies. She does require CGA to min A throughout for safety; Patient is improving, however does continue to have some apraxia with decreased control of RLE when negotiating obstacles. Patient would benefit from additional skilled PT intervention to improve strength, balance and gait safety;      Personal Factors and Comorbidities Age    Comorbidities HTN, CKD, fall risk, type 2 diabetes,    Examination-Activity Limitations Lift;Locomotion Level;Squat;Stairs;Stand;Toileting;Transfers;Bathing    Examination-Participation Restrictions Cleaning;Community Activity;Laundry;Meal Prep;Shop;Volunteer;Driving    Stability/Clinical Decision Making Stable/Uncomplicated    Rehab Potential Fair    PT Frequency 2x / week    PT Duration 8 weeks    PT Treatment/Interventions Cryotherapy;Electrical Stimulation;Moist Heat;Gait training;Stair training;Functional mobility training;Therapeutic activities;Therapeutic exercise;Neuromuscular re-education;Balance training;Patient/family education;Orthotic Fit/Training;Energy conservation    PT Next Visit Plan work on LE strengthening and balance; gait training, endurance, continue POC as previously indicated    PT Home Exercise Plan No changes this date    Consulted and Agree with Plan of Care Patient                 Issak Goley, PT, DPT 11/24/2021, 2:35 PM

## 2021-11-26 ENCOUNTER — Ambulatory Visit: Payer: Medicare HMO | Admitting: Occupational Therapy

## 2021-11-26 ENCOUNTER — Ambulatory Visit: Payer: Medicare HMO

## 2021-11-26 DIAGNOSIS — M6281 Muscle weakness (generalized): Secondary | ICD-10-CM | POA: Diagnosis not present

## 2021-11-26 DIAGNOSIS — R269 Unspecified abnormalities of gait and mobility: Secondary | ICD-10-CM

## 2021-11-26 DIAGNOSIS — R2681 Unsteadiness on feet: Secondary | ICD-10-CM

## 2021-11-26 DIAGNOSIS — R262 Difficulty in walking, not elsewhere classified: Secondary | ICD-10-CM

## 2021-11-26 DIAGNOSIS — R2689 Other abnormalities of gait and mobility: Secondary | ICD-10-CM

## 2021-11-26 DIAGNOSIS — R482 Apraxia: Secondary | ICD-10-CM

## 2021-11-26 DIAGNOSIS — R278 Other lack of coordination: Secondary | ICD-10-CM

## 2021-11-26 NOTE — Therapy (Addendum)
OUTPATIENT OCCUPATIONAL THERAPY TREATMENT NOTE   Patient Name: Cassandra Schaefer MRN: [id number] DOB:Jun 16, 1928, 88 y.o., female Today's Date: 11/26/2021   REFERRING PROVIDER: Tracie Harrier, MD    OT End of Session - 11/26/21 1311     Visit Number 41    Number of Visits 114    Date for OT Re-Evaluation 12/28/21    Authorization Time Period Reporting period starting 09/03/2021    OT Start Time 1300    OT Stop Time 1345    OT Time Calculation (min) 45 min    Equipment Utilized During Treatment tranport chair    Activity Tolerance Patient tolerated treatment well    Behavior During Therapy WFL for tasks assessed/performed                    Past Medical History:  Diagnosis Date   Anemia    Cough    RESOLVING, NO FEVER   Diabetes mellitus without complication ([place])    Gout    Hypertension    Hypothyroidism    PUD (peptic ulcer disease)    Stroke [facility])    Wears dentures    full upper, partial lower   Past Surgical History:  Procedure Laterality Date   AMPUTATION TOE Left 12/14/2017   Procedure: AMPUTATION TOE-4TH MPJ;  Surgeon: Samara Deist, DPM;  Location: [place];  Service: Podiatry;  Laterality: Left;  IVA LOCAL Diabetic - oral meds   APPENDECTOMY     CATARACT EXTRACTION W/PHACO Right 06/23/2015   Procedure: CATARACT EXTRACTION PHACO AND INTRAOCULAR LENS PLACEMENT (IOC);  Surgeon: Estill Cotta, MD;  Location: [facility];  Service: Ophthalmology;  Laterality: Right;  [place] 01:28 AP% 23.4 CDE 36.14 fluid pack lot # [id number]   CATARACT EXTRACTION W/PHACO Left 07/28/2015   Procedure: CATARACT EXTRACTION PHACO AND INTRAOCULAR LENS PLACEMENT (IOC);  Surgeon: Estill Cotta, MD;  Location: [facility];  Service: Ophthalmology;  Laterality: Left;  [place]    1:29.7 AP%  24.9 CDE   41.32 fluid casette lot #[id number] H exp 09/23/2016   COONOSCOPY     AND ENDOSCOPY   DILATION AND CURETTAGE OF UTERUS     ENDARTERECTOMY Left 11/13/2020   Procedure: Evacuation of  left neck hematoma;  Surgeon: Katha Cabal, MD;  Location: [facility];  Service: Vascular;  Laterality: Left;   ENDARTERECTOMY Left 11/13/2020   Procedure: ENDARTERECTOMY CAROTID;  Surgeon: Algernon Huxley, MD;  Location: [facility];  Service: Vascular;  Laterality: Left;   TONSILLECTOMY     TUBAL LIGATION     Patient Active Problem List   Diagnosis Date Noted   Carotid stenosis, symptomatic, with infarction ([place]) 11/13/2020   Gout flare 11/10/2020   UTI (urinary tract infection) 11/10/2020   Left carotid artery stenosis 11/10/2020   Chronic kidney disease 11/10/2020   Left basal ganglia embolic stroke ([place]) 70/17/7939   PAC (premature atrial contraction)    Pre-procedural cardiovascular examination    Ischemic stroke ([place]) 10/20/2020   TIA (transient ischemic attack) 10/19/2020   Right hemiparesis ([place]) 10/19/2020   Type 2 diabetes mellitus with stage 3 chronic kidney disease, without long-term current use of insulin ([place]) 08/24/2018   Hyperlipidemia, unspecified 07/19/2017   Hypertension 07/19/2017   PUD (peptic ulcer disease) 07/19/2017   Type 2 diabetes mellitus ([place]) 07/19/2017   Personal history of gout 08/12/2016   Anemia, unspecified 04/09/2016   Hypothyroidism, acquired 12/10/2015   REFERRING DIAG: CVA  THERAPY DIAG:  No diagnosis found.  Rationale for Evaluation and Treatment Rehabilitation  PERTINENT HISTORY:   R ankle pain, gout, T2DM, HTN, CKDIII; R/L carotid endarectomy  PRECAUTIONS: None  SUBJECTIVE: Pt. Reports doing well today  PAIN:  Are you having pain? 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 Pt. used her right hand to grasp, and store the design pieces, and perform translatory movements moving them from the palm to the tip of the digits prior to connecting the design pieces.  Pt. worked on sustaining bilateral shoulder elevation while connecting the  pieces with her right hand, and stabilizing the structure with the left hand. Pt. worked on right hand [facility] skills grasping 1/8" pegs with long nosed tweezers. Pt. worked on grasping them from a vertical, or horizontal position, changing the position, and placing them onto a small pegboard.    Pt. presents with limited right hand [facility] skills, and continues to present with difficulty using the right hand for performing translatory movements with small objects. Pt. dropped fewer  pegs from the tips of her digits on her right hand when connecting the pieces higher up on the structure. Pt. required increased time to manipulate the tweezers while grasping the small pegs, and turning them to change the position so they fit the pegboard. Pt. Continues to work on improving RUE [facility], and hand functioning in order to work towards improving, and maximizing independence with ADLs, and IADLs.      OT Short Term Goals - 03/23/21 1657       OT SHORT TERM GOAL #1   Title Pt will perform HEP for RUE strength/coordination independently.    Baseline Eval: HEP not yet established in outpatient, but pt is working with putty from [name]; 12/04/2020; [facility] HEP initiated this day with mod vc after return demo; 01/05/2021: indep with currently program, but ongoing as pt progresses; 01/28/2021: vc to revisit and focus on grip and pinch strengthening with putty; 03/23/21: pt is indep with HEP    Time 6    Period Weeks    Status Achieved    Target Date 03/23/21              OT Long Term Goals - 10/15/21 1322       OT LONG TERM GOAL #1   Title Pt will improve hand writing to 100% legibility with R hand to be able to independently write a check.    Baseline Eval: signature is 75% legible with R hand, requiring extra time; 12/04/2020: no chan to complete.ge from eval; 01/05/2021: Printing is 100% legible, signature is 90% legible, but still requires extra time and effort.; 01/26/21: Signature is 90% legible and speed/fluidity have  improved. 20th visit: 90% legible, 03/23/21: 90% legible, extra time 40th visit 90 % legibility with increased time, 50th visit: 90% legible with with increased time required 07/14/2021: 90% legibility. 07/30/2021: 90% legibility, pt. presses too hard through the pen, and pencil. 09/03/2021: 90% legibility; 10/06/21: Increased time, but pt states she signs cards and writes short notes on a card sufficiently.  Pt states she has no need to write checks anymore as her family assists with this and she has no desire to return to this, but she would like to work on her pressure modulation.  New goal below. 10/15/2021: Pt. conitnues to require increased time to complete note cards, and corespondence.    Time 12    Period Weeks    Status Partially Met  Target Date 12/28/21      OT LONG TERM GOAL #2   Title Pt will improve R hand coordination to enable good manipulation of iphone using R dominant hand      OT LONG TERM GOAL #3   Title Pt will improve GMC throughout RUE to enable pt to reach for and pick up ADL supplies with R dominant hand without dropping or knocking over objects.    Baseline Eval: Pt reports RUE is clumsy and easily knocks over objects when trying to reach for ADL supplies.  Pt verbalizes that her "aim is off."; 12/04/2020: pt reports slight improvement since eval and has started using her R hand to eat, but still feels clumsy; 01/05/2021: Greatly improved; pt is using silverware to eat with R hand, can comb hair with RUE, still mildly ataxic; 01/26/21: pt reports difficulty picking up objects within a small space (ie; may knock the toothpaste holder down when reaching for toothbrush), but reaching for light/larger objects is improving 20th visit: pt. continues to be mildly ataxic, however is consistently improving with motor control, and [place] while reaching targets; 03/23/21: Pt reaches with accuracy towards targets if she takes her time (pt drops and knocks things over when moving at a faster/normal  pace) 04/23/2021: Pt. continues to present with difficulty with depth perception, and impaired [place].40th visit: Pt. is improving, however tends to knock over lighter weight objects.50th visit: Pt. has improved with reaching for items without knocking them over, however continues to need work on accuracy.07/14/2021: Pt. is improving while motor control, and accuracy of reaching for objects. Pt. continues to work towards improving efficiency with reaching for objects. 07/30/2021: Pt. is able to able to reach for objects more accurately, however continues to present with limited motor control in the Right. 09/03/2021: Pt. Pt. is improving with reaching up without knocking items over. pt. is now able to stand at the sink and reach up without dropping items; 10/06/21: pt estimates she reaches with 85-90% accurate with RUE, occasional dropping. 10/15/2021: Pt. reports 85-90% accureacy, however presents with decreased accuracy the higher, or lower she reaches.    Time 12    Period Weeks    Status On-going    Target Date 12/28/21      OT LONG TERM GOAL #4   Title Pt will improve FOTO score to 68 or better to indicate measurable functional improvement.    Baseline Eval: FOTO 55; 01/05/2021: FOTO 61; 01/28/21: FOTO 61, 20th visit: 61; 03/23/21: FOTO 60 4oth visit: FOTO score: 61, 50th visit: FOTO 69 07/30/2021: FOTO 71  09/03/2021: FOTO 73; 10/06/21: 73 10/15/2021: 77    Time 12    Period Weeks    Status On-going      OT LONG TERM GOAL #6   Title Pt. will improve right hand [facility] skills to be able to indepedendently manipulate and set-up her medication/pills in the pillbox.    Baseline 07/14/2021: Pt. has difficulty manipulating small pills, and often drops them. 07/30/2021: Pt. continues to have difficulty grasping pills/medication for set-up of pillbox. 09/03/2021: Pt. is able to grasp pills from the pillbox without dropping them; 10/06/21: modified indep (with extra time to complete). 10/15/2021: Pt. reports requiring  increased time to complete.    Time 12    Period Weeks    Status Partially Met    Target Date 12/28/21      OT LONG TERM GOAL #7   Title Pt will improve accuracy with graded force through RUE during functional tasks  per self report.    Baseline 10/06/21: Pt reports she holds her greatgrandchild too tightly, scratches her arm too forcefully, and sometimes writes with too much pressure; pt struggles to grade force for functional activities.10/15/2021: Pt. continues to grasp items with the left hand too tightly.    Time 12    Period Weeks    Status Achieved              Plan - 10/22/21 1311     Clinical Impression Statement Pt. presents with limited right hand [facility] skills, and continues to present with difficulty using the right hand for performing translatory movements with small objects. Pt. dropped fewer  pegs from the tips of her digits on her right hand when connecting the pieces higher up on the structure. Pt. required increased time to manipulate the tweezers while grasping the small pegs, and turning them to change the position so they fit the pegboard. Pt. continues to work on improving RUE [facility], and hand functioning in order to work towards improving, and maximizing independence with ADLs, and IADLs.        OT Occupational Profile and History Detailed Assessment- Review of Records and additional review of physical, cognitive, psychosocial history related to current functional performance    Occupational performance deficits (Please refer to evaluation for details): ADL's;Leisure;IADL's    Body Structure / Function / Physical Skills ADL;Coordination;Endurance;GMC;UE functional use;Balance;Sensation;Body mechanics;IADL;Pain;Dexterity;FMC;Strength;Gait;Mobility    Rehab Potential Good    Clinical Decision Making Several treatment options, min-mod task modification necessary    Comorbidities Affecting Occupational Performance: May have comorbidities impacting occupational performance     Modification or Assistance to Complete Evaluation  Min-Moderate modification of tasks or assist with assess necessary to complete eval    OT Frequency 2x / week    OT Duration 12 weeks    OT Treatment/Interventions Self-care/ADL training;Therapeutic exercise;DME and/or AE instruction;Functional Mobility Training;Balance training;Neuromuscular education;Therapeutic activities;Patient/family education    Family Member Consulted daughter, Emi Holes [place], MS, OTR/L  Harrel Carina, OT 11/26/2021, 1:18 PM

## 2021-11-26 NOTE — Therapy (Signed)
OUTPATIENT PHYSICAL THERAPY TREATMENT NOTE Physical Therapy Progress Note   Dates of reporting period  10/15/21   to   11/24/21     Patient Name: Cassandra Schaefer MRN: [id number] DOB:06-Aug-1928, 88 y.o., female Today's Date: 11/26/2021  PCP: Roetta Sessions MD REFERRING PROVIDER: Roetta Sessions MD   PT End of Session - 11/26/21 1403     Visit Number 91    Number of Visits 102    Date for PT Re-Evaluation 12/17/21    Authorization Type [employer]    Authorization Time Period 10/22/21-12/17/21    Progress Note Due on Visit 100    PT Start Time 1345    PT Stop Time 1425    PT Time Calculation (min) 40 min    Equipment Utilized During Treatment Gait belt    Activity Tolerance Patient tolerated treatment well;No increased pain    Behavior During Therapy WFL for tasks assessed/performed              Past Medical History:  Diagnosis Date   Anemia    Cough    RESOLVING, NO FEVER   Diabetes mellitus without complication ([place])    Gout    Hypertension    Hypothyroidism    PUD (peptic ulcer disease)    Stroke [facility])    Wears dentures    full upper, partial lower   Past Surgical History:  Procedure Laterality Date   AMPUTATION TOE Left 12/14/2017   Procedure: AMPUTATION TOE-4TH MPJ;  Surgeon: Samara Deist, DPM;  Location: [place];  Service: Podiatry;  Laterality: Left;  IVA LOCAL Diabetic - oral meds   APPENDECTOMY     CATARACT EXTRACTION W/PHACO Right 06/23/2015   Procedure: CATARACT EXTRACTION PHACO AND INTRAOCULAR LENS PLACEMENT (IOC);  Surgeon: Estill Cotta, MD;  Location: [facility];  Service: Ophthalmology;  Laterality: Right;  [place] 01:28 AP% 23.4 CDE 36.14 fluid pack lot # [id number]   CATARACT EXTRACTION W/PHACO Left 07/28/2015   Procedure: CATARACT EXTRACTION PHACO AND INTRAOCULAR LENS PLACEMENT (IOC);  Surgeon: Estill Cotta, MD;  Location: [facility];  Service: Ophthalmology;  Laterality: Left;  [place]    1:29.7 AP%  24.9 CDE   41.32 fluid casette lot #[id number]  exp 09/23/2016   COONOSCOPY     AND ENDOSCOPY   DILATION AND CURETTAGE OF UTERUS     ENDARTERECTOMY Left 11/13/2020   Procedure: Evacuation of left neck hematoma;  Surgeon: Katha Cabal, MD;  Location: [facility];  Service: Vascular;  Laterality: Left;   ENDARTERECTOMY Left 11/13/2020   Procedure: ENDARTERECTOMY CAROTID;  Surgeon: Algernon Huxley, MD;  Location: [facility];  Service: Vascular;  Laterality: Left;   TONSILLECTOMY     TUBAL LIGATION     Patient Active Problem List   Diagnosis Date Noted   Carotid stenosis, symptomatic, with infarction ([place]) 11/13/2020   Gout flare 11/10/2020   UTI (urinary tract infection) 11/10/2020   Left carotid artery stenosis 11/10/2020   Chronic kidney disease 11/10/2020   Left basal ganglia embolic stroke ([place]) 56/21/3086   PAC (premature atrial contraction)    Pre-procedural cardiovascular examination    Ischemic stroke ([place]) 10/20/2020   TIA (transient ischemic attack) 10/19/2020   Right hemiparesis ([place]) 10/19/2020   Type 2 diabetes mellitus with stage 3 chronic kidney disease, without long-term current use of insulin ([place]) 08/24/2018   Hyperlipidemia, unspecified 07/19/2017   Hypertension 07/19/2017   PUD (peptic ulcer disease) 07/19/2017   Type 2 diabetes mellitus ([place]) 07/19/2017   Personal history of gout  08/12/2016   Anemia, unspecified 04/09/2016   Hypothyroidism, acquired 12/10/2015    REFERRING DIAG: CVA with right hemiparesis  THERAPY DIAG:  Muscle weakness (generalized)  Other lack of coordination  Unsteadiness on feet  Other abnormalities of gait and mobility  Abnormality of gait and mobility  Apraxia  Difficulty in walking, not elsewhere classified  Rationale for Evaluation and Treatment Rehabilitation  PERTINENT HISTORY: 88 y.o. female with medical history significant for type 2 diabetes mellitus, essential hypertension, acquired hypothyroidism, hyperlipidemia, stage III chronic kidney disease reports increased  right sided weakness on 10/19/20. She was diagnosed with left basal ganglia ischemic stroke. She was discharged to inpatient rehab for 10 days and then discharged home. Patient was evaluated by outpatient PT on 11/12/20. CVA was due to carotid stenosis. She underwent left carotid endartectomy on 7/21. Procedure went well however patient suffered from left cervical hematoma and had subsequent surgical procedure to stop the bleed and remove the hematoma on 11/13/20. They were concerned with her breathing and therefore intubated her for 1 day, she was in ICU for 2 days and transferred to med/surge on 11/15/20. Patient was discharged home on 11/17/20 and is now returning to outpatient PT. She has had the stiches removed and is healing well. Denies any neck pain or stiffness. She lives with her daughter and has 24/7 caregiver support. She is still using RW for most ambulation. She is able to transfer to bedside commode well and is able to stand short time unsupported. She reports feeling very weak and fatigued. She reports she has been dragging her right foot more. She denies any numbness/tingling; She reports feeling like her legs are wanting to do more but is hesitant because of fear of falling. She is mod I for self care morning routine. She does still receive help with showering. She has been working on increasing her activity but denies any formal exercise.   PRECAUTIONS: Fall risk  SUBJECTIVE:  -AMB 433f, 4WW, minguard Assist  Recovery interval due to increase SOB by end of 3rd lap  -AMB 4760f 4WW, minGaurd Assist  Recovery interval due to increase SOB by end of 3rd lap   -AMB up down steps c rails ad lib , step through pattern 12 steps   -10x STS from Chair + airex, tactile and verbal cues to optimize coupling of trunk, posturing of feet *rest  -10x STS from Chair + airex, tactile and verbal cues to optimize coupling of trunk, posturing of feet *rest  *with hip fatigue, pt loses forward trunk lean and  tries to be more quads dominant which result sin more retropulsion and collapse into chair, improved with cues.   PAIN:  Are you having pain? No   TODAY'S TREATMENT: 11/26/2021    PATIENT EDUCATION: Education details: Exercise technique/positioning; HEP Person educated: Patient Education method: ExConsulting civil engineerDemonstration, and Verbal cues Education comprehension: verbalized understanding, returned demonstration, and needs further education   HOME EXERCISE PROGRAM: No changes this session;   Instructed patient in pool exercise: Access Code: [email or web address] URL: [email or web address] Date: 10/22/2021 Prepared by: MaBlanche EastExercises - Seated Knee Extension with Resistance  - 1 x daily - 7 x weekly - 2 sets - 15 reps - Forward and Backward Stepping at [employer]- 1 x daily - 7 x weekly - 1 sets - 5 reps - Freestyle Swimming with Snorkel Mask  - 1 x daily - 7 x weekly - 1 sets - 2-3 reps - Heel Walking  - 1 x daily -  7 x weekly - 1 sets - 5 reps - Toe Walking  - 1 x daily - 7 x weekly - 1 sets - 5 reps - Single Leg Stance at Pool Wall  - 1 x daily - 7 x weekly - 1 sets - 3-5 reps - 15-30 sec hold   PT Short Term Goals -       PT SHORT TERM GOAL #1   Title Patient will be adherent to HEP at least 3x a week to improve functional strength and balance for better safety at home.    Baseline 9/21: Pt reports compliance with HEP and is confident, 1/3: doing exercise 2x a week; 06/25/21: 3x/week, 5/18: consistent; 8/1: consistent;    Time 4    Period Weeks    Status Achieved    Target Date 06/25/21      PT SHORT TERM GOAL #2   Title Patient will be independent in transferring sit<>Stand without pushing on arm rests to improve ability to get up from chair.    Baseline 9/21: pt able to complete STS without use of UEs, 1/3:able to stand but has moderate difficulty requiring increased time; 06/25/21: able to perform indep without hands but requiring use of momentum to attain  standing. Remains stable. 5/18: able to stand without UE assist, close supervision, remains stable;    Time 4    Period Weeks    Status Achieved    Target Date 07/23/21              PT Long Term Goals -      PT LONG TERM GOAL #1   Title Patient (> 65 years old) will complete five times sit to stand test in < 15 seconds indicating an increased LE strength and improved balance.    Baseline 9/21: 29 seconds hands-free 10/10: deferred 10/12: 34 sec hands free; 11/2: 23.8 sec hands-free; 12/7: 33.2 sec hands free, 1/3: 33.92 hands free; 06/25/21: 25.69 sec, 4/13: 15.5 sec hands free, 5/18: 23 sec without UE 6/22: deferred, 8/1: 15.7 sec without UE   Time 8    Period Weeks    Status Partially Met    Target Date  12/17/21     PT LONG TERM GOAL #2   Title Patient will increase six minute walk test distance to >400 feet for improve gait ability    Baseline 9/21: 368 ft with RW; 10/10: deferred 10/12: 408 ft; 11/2: 476 ft with 4WW, 1/3: 475 feet with RW; 4/13: 450 feet without AD CGA 10/15/21: 460 feet without AD, CGA   Time 8    Period Weeks    Status Achieved    Target Date 12/17/21     PT LONG TERM GOAL #3   Title Patient will increase 10 meter walk test to >1.60ms as to improve gait speed for better community ambulation and to reduce fall risk.    Baseline 9/21: 0.5 m/s with RW; 10/10 deferred 10/12: 0.93 m/s with 41OX 11/2: 0.83 m/s with 40RU 12/7: 0.52 m/s with RW, 1/3: 0.704; 06/25/21: 0.55 m/s with SPC. 5/18: 0.4 m/s without AD , 10/15/21 0.507 m/s without AD, 8/1: 0.64 m/s   Time 8    Period Weeks    Status Partially Met   Target Date 12/17/21     PT LONG TERM GOAL #4   Title Patient will be independent with ascend/descend 6 steps using single UE in step over step pattern without LOB.    Baseline 9/21:  Patient uses PT stairs (  4 steps) with bilateral upper extremity support on handrails for both ascending/descending.  Patient exhibits reciprocal pattern ascending and step to pattern  descending. 10/10: deferred; 10/12: ascending/descending recip. steps with BUE support; 11/2: Ascended/descended 8 steps total, reciprocal pattern ascending and step-to descending. Pt uses UUE support throughout with CGA assist.; 12/7: Ascended and descended 8 steps total with UUE support and CGA. Reciprocal stepping ascending and step to descending., 1/3: Deferrred; 06/25/21: able to perform with SUE support and reciprocal pattern asc/desc, but does require close CGA with descending. 5/18: independent with 2 handrails, close supervision with 1 handrai; 10/15/21: requires 2 handrails, 8/1: requires 1 handrail, descends one step at a time   Time 8    Period Weeks    Status Partially Met    Target Date 12/17/21     PT LONG TERM GOAL #5   Title Patient will demonstrate an improved Berg Balance Score of >45/56 as to demonstrate improved balance with ADLs such as sitting/standing and transfer balance and reduced fall risk.    Baseline 9/21: deferred d/t time; 9/29: 42/56; 10/10: deferred; 10/12: 44/56; 11/2: deferred; 11/7: 46/56, 1/3: deferred    Time 8    Period Weeks    Status Achieved    Target Date 12/17/21     PT LONG TERM GOAL #6   Title Patient will improve FOTO score to >60% to indicate improved functional mobility with ADLs.    Baseline 9/21: 59; 10/10: 57; 11/2: 60; 12/7: 58%, 1/3: 61%, 4/13: 56% 10/15/21: 58%, 8/1: 56%   Time 8    Period Weeks    Status Partially Met   Target Date 12/17/21     PT LONG TERM GOAL #7   Title Pt will improve FGA by a minimum of 5 points to indicate clinically significant improvement in reduced risk of falls with walking tasks.    Baseline 06/25/21: 12, 4/13: deferred, 5/18: deferred due to increased ankle discomfort; 6/22: deferred due to elevated BP- will assess next visit; 10/20/21: 8/30, 8/1: 8/30   Time 8    Period Weeks    Status Not met   Target Date 12/17/21             Plan -    Clinical Impression Statement With review of LT treatment  goals, trying to bring more global focus of session back to primary outcome measures- heavy walking session with >956f, conitnued focus on STS strength and independence (which is as much a strength issue as it is a trunk control issue) and performance of stairs. Balance is an innate part of entire session, but no isolated balance interventions are utilized this session as in previous. Pt recovers her DOE well with 2-3 minutes seated recovery. New HTN medication does not appear to be helping with vitals response as demonstrated in today's session. Patient continues to be limited with endurance with increased fatigue and elevated BP with prolonged standing activities. Her static standing balance is good, but she does test as a high risk for falls based on functional gait assessment with impaired dynamic balance. Patient also continues to exhibit weakness in RLE.Patient would benefit from additional skilled PT intervention to improve strength, balance and gait safety;   Personal Factors and Comorbidities Age    Comorbidities HTN, CKD, fall risk, type 2 diabetes,    Examination-Activity Limitations Lift;Locomotion Level;Squat;Stairs;Stand;Toileting;Transfers;Bathing    Examination-Participation Restrictions Cleaning;Community Activity;Laundry;Meal Prep;Shop;Volunteer;Driving    Stability/Clinical Decision Making Stable/Uncomplicated    Rehab Potential Fair    PT Frequency 2x /  week    PT Duration 8 weeks    PT Treatment/Interventions Cryotherapy;Electrical Stimulation;Moist Heat;Gait training;Stair training;Functional mobility training;Therapeutic activities;Therapeutic exercise;Neuromuscular re-education;Balance training;Patient/family education;Orthotic Fit/Training;Energy conservation    PT Next Visit Plan work on LE strengthening and balance; gait training, endurance, continue POC as previously indicated    PT Home Exercise Plan No changes this date    Consulted and Agree with Plan of Care Patient               2:11 PM, 11/26/21 Etta Grandchild, PT, DPT Physical Therapist - [facility]  Outpatient Physical Therapy- [place] [phone]      [place] C, PT, DPT 11/26/2021, 2:11 PM

## 2021-11-29 ENCOUNTER — Encounter (INDEPENDENT_AMBULATORY_CARE_PROVIDER_SITE_OTHER): Payer: Self-pay | Admitting: Nurse Practitioner

## 2021-11-29 NOTE — Progress Notes (Signed)
Subjective:    Patient ID: Cassandra Schaefer, female    DOB: 06-18-1928, 88 y.o.   MRN: [id number] Chief Complaint  Patient presents with   Follow-up    Ultrasound follow up    The patient is seen for follow up evaluation of carotid stenosis. The carotid stenosis followed by ultrasound.   The patient denies amaurosis fugax. There is no recent history of TIA symptoms or focal motor deficits. There is no prior documented CVA.  The patient is taking enteric-coated aspirin 81 mg daily.  There is no history of migraine headaches. There is no history of seizures.  No recent shortening of the patient's walking distance or new symptoms consistent with claudication.  No history of rest pain symptoms. No new ulcers or wounds of the lower extremities have occurred.  There is no history of DVT, PE or superficial thrombophlebitis. No recent episodes of angina or shortness of breath documented.   Carotid Duplex done today shows 1 to 39% stenosis bilaterally.  No change compared to last study in 03/2021     Review of Systems  All other systems reviewed and are negative.      Objective:   Physical Exam Vitals reviewed.  HENT:     Head: Normocephalic.  Cardiovascular:     Rate and Rhythm: Normal rate.     Pulses: Normal pulses.  Pulmonary:     Effort: Pulmonary effort is normal.  Skin:    General: Skin is warm and dry.  Neurological:     Mental Status: She is alert and oriented to person, place, and time.  Psychiatric:        Mood and Affect: Mood normal.        Behavior: Behavior normal.        Thought Content: Thought content normal.        Judgment: Judgment normal.     BP (!) 175/68 (BP Location: Left Arm)   Pulse 63   Resp 16   Wt 122 lb (55.3 kg)   BMI 21.61 kg/m   Past Medical History:  Diagnosis Date   Anemia    Cough    RESOLVING, NO FEVER   Diabetes mellitus without complication ([facility])    Gout    Hypertension    Hypothyroidism    PUD (peptic ulcer disease)     Stroke ([place])    Wears dentures    full upper, partial lower    Social History   Socioeconomic History   Marital status: Widowed    Spouse name: Not on file   Number of children: Not on file   Years of education: Not on file   Highest education level: Not on file  Occupational History   Not on file  Tobacco Use   Smoking status: Former    Types: Cigarettes    Quit date: 12/26/1983    Years since quitting: 37.9   Smokeless tobacco: Never  Vaping Use   Vaping Use: Never used  Substance and Sexual Activity   Alcohol use: No   Drug use: Not on file   Sexual activity: Not on file  Other Topics Concern   Not on file  Social History Narrative   Live with Genelle Bal one of the daughters.   Social Determinants of Health   Financial Resource Strain: Not on file  Food Insecurity: Not on file  Transportation Needs: Not on file  Physical Activity: Not on file  Stress: Not on file  Social Connections: Not on file  Intimate Partner Violence: Not on file    Past Surgical History:  Procedure Laterality Date   AMPUTATION TOE Left 12/14/2017   Procedure: AMPUTATION TOE-4TH MPJ;  Surgeon: Samara Deist, DPM;  Location: [place];  Service: Podiatry;  Laterality: Left;  IVA LOCAL Diabetic - oral meds   APPENDECTOMY     CATARACT EXTRACTION W/PHACO Right 06/23/2015   Procedure: CATARACT EXTRACTION PHACO AND INTRAOCULAR LENS PLACEMENT (IOC);  Surgeon: Estill Cotta, MD;  Location: [facility];  Service: Ophthalmology;  Laterality: Right;  [place] 01:28 AP% 23.4 CDE 36.14 fluid pack lot # [id number]   CATARACT EXTRACTION W/PHACO Left 07/28/2015   Procedure: CATARACT EXTRACTION PHACO AND INTRAOCULAR LENS PLACEMENT (IOC);  Surgeon: Estill Cotta, MD;  Location: [facility];  Service: Ophthalmology;  Laterality: Left;  [place]    1:29.7 AP%  24.9 CDE   41.32 fluid casette lot #[id number] H exp 09/23/2016   COONOSCOPY     AND ENDOSCOPY   DILATION AND CURETTAGE OF UTERUS     ENDARTERECTOMY  Left 11/13/2020   Procedure: Evacuation of left neck hematoma;  Surgeon: Katha Cabal, MD;  Location: [facility];  Service: Vascular;  Laterality: Left;   ENDARTERECTOMY Left 11/13/2020   Procedure: ENDARTERECTOMY CAROTID;  Surgeon: Algernon Huxley, MD;  Location: [facility];  Service: Vascular;  Laterality: Left;   TONSILLECTOMY     TUBAL LIGATION      Family History  Problem Relation Age of Onset   Breast cancer Neg Hx     Allergies  Allergen Reactions   Penicillins Hives   Sulfa Antibiotics Hives   Tetanus Toxoids Swelling   Macrobid [Nitrofurantoin] Nausea And Vomiting and Other (See Comments)    HYPOTENSION   Procaine Other (See Comments)    "went into shock"   Tuberculin Tests Swelling and Rash       Latest Ref Rng & Units 11/17/2020    5:42 AM 11/15/2020    7:26 AM 11/14/2020    5:29 AM  CBC  WBC 4.0 - 10.5 K/uL 13.5  19.4  22.8   Hemoglobin 12.0 - 15.0 g/dL 9.4  9.8  11.2   Hematocrit 36.0 - 46.0 % 29.0  30.1  35.3   Platelets 150 - 400 K/uL 204  241  249       CMP     Component Value Date/Time   NA 139 11/15/2020 0726   K 4.2 11/15/2020 0726   CL 110 11/15/2020 0726   CO2 21 (L) 11/15/2020 0726   GLUCOSE 136 (H) 11/15/2020 0726   BUN 29 (H) 11/15/2020 0726   CREATININE 1.47 (H) 11/15/2020 0726   CALCIUM 8.6 (L) 11/15/2020 0726   PROT 6.3 (L) 10/28/2020 0517   ALBUMIN 3.2 (L) 10/28/2020 0517   AST 14 (L) 10/28/2020 0517   ALT 10 10/28/2020 0517   ALKPHOS 77 10/28/2020 0517   BILITOT 0.9 10/28/2020 0517   GFRNONAA 34 (L) 11/15/2020 0726     No results found.     Assessment & Plan:   1. Carotid stenosis, symptomatic, with infarction [facility]) Recommend:  Given the patient's asymptomatic subcritical stenosis no further invasive testing or surgery at this time.  Duplex ultrasound shows 1-39% stenosis bilaterally.  Continue antiplatelet therapy as prescribed Continue management of CAD, HTN and Hyperlipidemia Healthy heart diet,  encouraged exercise  at least 4 times per week Follow up in 12 months with duplex ultrasound and physical exam    2. Type 2 diabetes mellitus without complication, without long-term current use  of insulin ([place]) Continue hypoglycemic medications as already ordered, these medications have been reviewed and there are no changes at this time.  Hgb A1C to be monitored as already arranged by primary service   3. Hyperlipidemia, unspecified hyperlipidemia type Continue statin as ordered and reviewed, no changes at this time    Current Outpatient Medications on File Prior to Visit  Medication Sig Dispense Refill   allopurinol (ZYLOPRIM) 100 MG tablet Take 100 mg by mouth daily.     aspirin 81 MG chewable tablet Chew 1 tablet (81 mg total) by mouth daily.     atorvastatin (LIPITOR) 80 MG tablet Take 1 tablet (80 mg total) by mouth daily after supper. 30 tablet 0   Blood Glucose Monitoring Suppl (FIFTY50 GLUCOSE METER 2.0) w/Device KIT Use as directed Dx code: 250.00     colchicine 0.6 MG tablet Take 0.5 tablets (0.3 mg total) by mouth daily. 30 tablet 0   fosinopril (MONOPRIL) 10 MG tablet Take 1 tablet by mouth daily.     glucose blood test strip USE TO TEST BLOOD SUGAR DAILY. DX CODE: E11.9     iron polysaccharides (NIFEREX) 150 MG capsule Take by mouth.     JANUVIA 50 MG tablet Take 50 mg by mouth daily.     levothyroxine (SYNTHROID) 75 MCG tablet Take 75 mcg by mouth daily.     levothyroxine (SYNTHROID, LEVOTHROID) 88 MCG tablet Take 88 mcg by mouth daily before breakfast.     Multiple Vitamins-Minerals (ICAPS AREDS 2 PO) Take by mouth 2 (two) times daily. Reported on 06/23/2015     oxyCODONE-acetaminophen (PERCOCET/ROXICET) 5-325 MG tablet Take 1 tablet by mouth every 8 (eight) hours as needed for moderate pain or severe pain. 20 tablet 0   potassium chloride (KLOR-CON) 10 MEQ tablet Take 10 mEq by mouth daily.     predniSONE (STERAPRED UNI-PAK 21 TAB) 5 MG (21) TBPK tablet Use as directed     vitamin B-12  (CYANOCOBALAMIN) 1000 MCG tablet Take 2,000 mcg by mouth daily.      clopidogrel (PLAVIX) 75 MG tablet Take 1 tablet (75 mg total) by mouth daily. (Patient not taking: Reported on 04/01/2021) 30 tablet 0   metFORMIN (GLUCOPHAGE-XR) 500 MG 24 hr tablet Take 1 tablet (500 mg total) by mouth at bedtime. (Patient not taking: Reported on 11/11/2021) 30 tablet 0   No current facility-administered medications on file prior to visit.    There are no Patient Instructions on file for this visit. No follow-ups on file.   Kris Hartmann, NP

## 2021-12-01 ENCOUNTER — Encounter: Payer: Self-pay | Admitting: Physical Therapy

## 2021-12-01 ENCOUNTER — Encounter: Payer: Self-pay | Admitting: Occupational Therapy

## 2021-12-01 ENCOUNTER — Ambulatory Visit: Payer: Medicare HMO | Admitting: Occupational Therapy

## 2021-12-01 ENCOUNTER — Ambulatory Visit: Payer: Medicare HMO

## 2021-12-01 DIAGNOSIS — M6281 Muscle weakness (generalized): Secondary | ICD-10-CM

## 2021-12-01 DIAGNOSIS — R2689 Other abnormalities of gait and mobility: Secondary | ICD-10-CM

## 2021-12-01 DIAGNOSIS — R269 Unspecified abnormalities of gait and mobility: Secondary | ICD-10-CM

## 2021-12-01 DIAGNOSIS — R2681 Unsteadiness on feet: Secondary | ICD-10-CM

## 2021-12-01 DIAGNOSIS — R278 Other lack of coordination: Secondary | ICD-10-CM

## 2021-12-01 NOTE — Therapy (Signed)
Occupational Therapy Progress/Recertification Note  Dates of reporting period  10/15/2021   to   12/01/2021    Patient Name: Cassandra Schaefer MRN: [id number] DOB:[birth year], 88 y.o., female Today's Date: 12/01/2021   REFERRING PROVIDER: Tracie Harrier, MD    OT End of Session - 12/01/21 1310     Visit Number 75    Number of Visits 114    Date for OT Re-Evaluation 12/28/21    Authorization Time Period Reporting period starting 12/01/2021    OT Start Time 1307    OT Stop Time 1345    OT Time Calculation (min) 38 min    Activity Tolerance Patient tolerated treatment well    Behavior During Therapy WFL for tasks assessed/performed                    Past Medical History:  Diagnosis Date   Anemia    Cough    RESOLVING, NO FEVER   Diabetes mellitus without complication ([place])    Gout    Hypertension    Hypothyroidism    PUD (peptic ulcer disease)    Stroke [facility])    Wears dentures    full upper, partial lower   Past Surgical History:  Procedure Laterality Date   AMPUTATION TOE Left 12/14/2017   Procedure: AMPUTATION TOE-4TH MPJ;  Surgeon: Samara Deist, DPM;  Location: [place];  Service: Podiatry;  Laterality: Left;  IVA LOCAL Diabetic - oral meds   APPENDECTOMY     CATARACT EXTRACTION W/PHACO Right 06/23/2015   Procedure: CATARACT EXTRACTION PHACO AND INTRAOCULAR LENS PLACEMENT (IOC);  Surgeon: Estill Cotta, MD;  Location: [facility];  Service: Ophthalmology;  Laterality: Right;  [place] 01:28 AP% 23.4 CDE 36.14 fluid pack lot # [id number]   CATARACT EXTRACTION W/PHACO Left 07/28/2015   Procedure: CATARACT EXTRACTION PHACO AND INTRAOCULAR LENS PLACEMENT (IOC);  Surgeon: Estill Cotta, MD;  Location: [facility];  Service: Ophthalmology;  Laterality: Left;  [place]    1:29.7 AP%  24.9 CDE   41.32 fluid casette lot #[id number] H exp 09/23/2016   COONOSCOPY     AND ENDOSCOPY   DILATION AND CURETTAGE OF UTERUS     ENDARTERECTOMY Left 11/13/2020   Procedure:  Evacuation of left neck hematoma;  Surgeon: Katha Cabal, MD;  Location: [facility];  Service: Vascular;  Laterality: Left;   ENDARTERECTOMY Left 11/13/2020   Procedure: ENDARTERECTOMY CAROTID;  Surgeon: Algernon Huxley, MD;  Location: [facility];  Service: Vascular;  Laterality: Left;   TONSILLECTOMY     TUBAL LIGATION     Patient Active Problem List   Diagnosis Date Noted   Carotid stenosis, symptomatic, with infarction ([place]) 11/13/2020   Gout flare 11/10/2020   UTI (urinary tract infection) 11/10/2020   Left carotid artery stenosis 11/10/2020   Chronic kidney disease 11/10/2020   Left basal ganglia embolic stroke ([place]) 77/82/4235   PAC (premature atrial contraction)    Pre-procedural cardiovascular examination    Ischemic stroke ([place]) 10/20/2020   TIA (transient ischemic attack) 10/19/2020   Right hemiparesis ([place]) 10/19/2020   Type 2 diabetes mellitus with stage 3 chronic kidney disease, without long-term current use of insulin ([place]) 08/24/2018   Hyperlipidemia, unspecified 07/19/2017   Hypertension 07/19/2017   PUD (peptic ulcer disease) 07/19/2017   Type 2 diabetes mellitus ([place]) 07/19/2017   Personal history of gout 08/12/2016   Anemia, unspecified 04/09/2016   Hypothyroidism, acquired 12/10/2015   REFERRING DIAG: CVA  THERAPY DIAG:  Muscle weakness (generalized)  Other lack of  coordination  Rationale for Evaluation and Treatment Rehabilitation  PERTINENT HISTORY:   R ankle pain, gout, T2DM, HTN, CKDIII; R/L carotid endarectomy  PRECAUTIONS: None  SUBJECTIVE: Pt. Reports doing well today  PAIN:  Are you having pain? No     OBJECTIVE:   TODAY'S TREATMENT:    Measurements were obtained, and goals were reviewed with the Patient.  Pt. Is making steady progress overall with Right hand function skills. Pt. Has improved with writing legibility, grasping and manipulating small objects with the right hand, modulating graded pressure to normalize the amount of force  applied when picking up objects with the right hand. Pt. continues to progress with manipulating small pills from her pillbox. Pt. Is improving with motor control with improved accuracy when reaching for objects with the Right hand. Pt. FOTO score is 76. Pt. continues to present with decreased motor control, and [facility] skills when manipulating objects while her bilateral UEs are sustained in elevation. Pt. continues to present with decreased speed, and tasks require increased time when performed with the right hand. Pt. worked on bilateral [facility] skills needed to grasp small resistive beads. Pt. worked on connecting the beads using a 3pt. pinch, and pt. Pinch grasp. Pt. worked on disconnecting the resistive beads using a lateral pinch grasp, and 3pt. pinch grasp.  Pt. Continues to work on improving right hand [facility] skills, motor control in order to improve the efficiency with which she completes ADL, and IADL tasks with her right hand.   .     OT Short Term Goals - 03/23/21 1657       OT SHORT TERM GOAL #1   Title Pt will perform HEP for RUE strength/coordination independently.    Baseline Eval: HEP not yet established in outpatient, but pt is working with putty from [name]; 12/04/2020; [facility] HEP initiated this day with mod vc after return demo; 01/05/2021: indep with currently program, but ongoing as pt progresses; 01/28/2021: vc to revisit and focus on grip and pinch strengthening with putty; 03/23/21: pt is indep with HEP    Time 6    Period Weeks    Status Achieved    Target Date 03/23/21              OT Long Term Goals - 10/15/21 1322       OT LONG TERM GOAL #1   Title Pt will improve hand writing to 100% legibility with R hand to be able to independently write a check.    Baseline Eval: signature is 75% legible with R hand, requiring extra time; 12/04/2020: no chan to complete.ge from eval; 01/05/2021: Printing is 100% legible, signature is 90% legible, but still requires extra time and effort.;  01/26/21: Signature is 90% legible and speed/fluidity have improved. 20th visit: 90% legible, 03/23/21: 90% legible, extra time 40th visit 90 % legibility with increased time, 50th visit: 90% legible with with increased time required 07/14/2021: 90% legibility. 07/30/2021: 90% legibility, pt. presses too hard through the pen, and pencil. 09/03/2021: 90% legibility; 10/06/21: Increased time, but pt states she signs cards and writes short notes on a card sufficiently.  Pt states she has no need to write checks anymore as her family assists with this and she has no desire to return to this, but she would like to work on her pressure modulation.  New goal below. 10/15/2021: Pt. continues to require increased time to complete note cards, and corespondence. 12/01/2021: 90% legible with increased time to complete.   Time 12  Period Weeks    Status Partially Met    Target Date 12/28/21               OT LONG TERM GOAL #3   Title Pt will improve GMC throughout RUE to enable pt to reach for and pick up ADL supplies with R dominant hand without dropping or knocking over objects.    Baseline Eval: Pt reports RUE is clumsy and easily knocks over objects when trying to reach for ADL supplies.  Pt verbalizes that her "aim is off."; 12/04/2020: pt reports slight improvement since eval and has started using her R hand to eat, but still feels clumsy; 01/05/2021: Greatly improved; pt is using silverware to eat with R hand, can comb hair with RUE, still mildly ataxic; 01/26/21: pt reports difficulty picking up objects within a small space (ie; may knock the toothpaste holder down when reaching for toothbrush), but reaching for light/larger objects is improving 20th visit: pt. continues to be mildly ataxic, however is consistently improving with motor control, and [place] while reaching targets; 03/23/21: Pt reaches with accuracy towards targets if she takes her time (pt drops and knocks things over when moving at a faster/normal pace)  04/23/2021: Pt. continues to present with difficulty with depth perception, and impaired [place].40th visit: Pt. is improving, however tends to knock over lighter weight objects.50th visit: Pt. has improved with reaching for items without knocking them over, however continues to need work on accuracy.07/14/2021: Pt. is improving while motor control, and accuracy of reaching for objects. Pt. continues to work towards improving efficiency with reaching for objects. 07/30/2021: Pt. is able to able to reach for objects more accurately, however continues to present with limited motor control in the Right. 09/03/2021: Pt. Pt. is improving with reaching up without knocking items over. pt. is now able to stand at the sink and reach up without dropping items; 10/06/21: pt estimates she reaches with 85-90% accurate with RUE, occasional dropping. 10/15/2021: Pt. reports 85-90% accureacy, however presents with decreased accuracy the higher, or lower she reaches. 12/01/2021: Pt. Has improved with reaching for objects with more accuracy 90% of the time.   Time 12    Period Weeks    Status On-going    Target Date 02/23/22      OT LONG TERM GOAL #4   Title Pt will improve FOTO score to 68 or better to indicate measurable functional improvement.    Baseline Eval: FOTO 55; 01/05/2021: FOTO 61; 01/28/21: FOTO 61, 20th visit: 61; 03/23/21: FOTO 60 4oth visit: FOTO score: 61, 50th visit: FOTO 69 07/30/2021: FOTO 71  09/03/2021: FOTO 73; 10/06/21: 73 10/15/2021: 77 12/01/2021: 76   Time 12    Period Weeks    Status On-going      OT LONG TERM GOAL #6   Title Pt. will improve right hand [place] skills to be able to indepedendently manipulate and set-up her medication/pills in the pillbox.    Baseline 07/14/2021: Pt. has difficulty manipulating small pills, and often drops them. 07/30/2021: Pt. continues to have difficulty grasping pills/medication for set-up of pillbox. 09/03/2021: Pt. is able to grasp pills from the pillbox without dropping them;  10/06/21: modified indep (with extra time to complete). 10/15/2021: Pt. reports requiring increased time to complete. 12/01/2021: Pt. Requires increased time  for more control, and coordination   Time 12    Period Weeks    Status Partially Met    Target Date 02/23/2022      OT LONG TERM GOAL #  7   Title Pt will improve accuracy with graded force through RUE during functional tasks per self report.    Baseline 10/06/21: Pt reports she holds her greatgrandchild too tightly, scratches her arm too forcefully, and sometimes writes with too much pressure; pt struggles to grade force for functional activities.10/15/2021: Pt. continues to grasp items with the right hand too tightly.  12/01/2021:  Pt. Is improving with with modulating graded pressure with grasping items with the right hand.   Time 12    Period Weeks    Status Achieved        LONG TERM GOALS #8  Title: Pt. Will independently manipulate ADL/IADL objects with her right hand with increased speed 100% of the time Baseline:  12/01/2021: Pt. Requires increased time to complete all tasks.      Time: 12 Period: Weeks Status: New Target Date: 02/23/2022                                                                                 Plan - 10/22/21 1311     Clinical Impression Statement Measurements were obtained, and goals were reviewed with the Patient.  Pt. Is making steady progress overall with Right hand function skills. Pt. Has improved with writing legibility, grasping and manipulating small objects with the right hand, modulating graded pressure to normalize the amount of force applied when picking up objects with the right hand. Pt. continues to progress with manipulating small pills from her pillbox. Pt. Is improving with motor control with improved accuracy when reaching for objects with the Right hand. Pt. FOTO score is 76. Pt. continues to present with decreased motor control, and [facility] skills when manipulating objects while her  bilateral UEs are sustained in elevation. Pt. continues to present with decreased speed, and tasks require increased time when performed with the right hand. Pt. worked on bilateral [facility] skills needed to grasp small resistive beads. Pt. worked on connecting the beads using a 3pt. pinch, and pt. Pinch grasp. Pt. worked on disconnecting the resistive beads using a lateral pinch grasp, and 3pt. pinch grasp.  Pt. Continues to work on improving right hand [place] skills, motor control in order to improve the efficiency with which she completes ADL, and IADL tasks with her right hand.         OT Occupational Profile and History Detailed Assessment- Review of Records and additional review of physical, cognitive, psychosocial history related to current functional performance    Occupational performance deficits (Please refer to evaluation for details): ADL's;Leisure;IADL's    Body Structure / Function / Physical Skills ADL;Coordination;Endurance;GMC;UE functional use;Balance;Sensation;Body mechanics;IADL;Pain;Dexterity;FMC;Strength;Gait;Mobility    Rehab Potential Good    Clinical Decision Making Several treatment options, min-mod task modification necessary    Comorbidities Affecting Occupational Performance: May have comorbidities impacting occupational performance    Modification or Assistance to Complete Evaluation  Min-Moderate modification of tasks or assist with assess necessary to complete eval    OT Frequency 2x / week    OT Duration 12 weeks    OT Treatment/Interventions Self-care/ADL training;Therapeutic exercise;DME and/or AE instruction;Functional Mobility Training;Balance training;Neuromuscular education;Therapeutic activities;Patient/family education    Family Member Consulted daughter, Olivia Mackie  Harrel Carina, MS, OTR/L  Harrel Carina, OT 12/01/2021, 1:31 PM

## 2021-12-01 NOTE — Therapy (Signed)
OUTPATIENT PHYSICAL THERAPY TREATMENT NOTE    Patient Name: Cassandra Schaefer MRN: [id number] DOB:1929-03-15, 88 y.o., female Today's Date: 12/01/2021  PCP: Roetta Sessions MD REFERRING PROVIDER: Roetta Sessions MD   PT End of Session - 12/01/21 1349     Visit Number 92    Number of Visits 102    Date for PT Re-Evaluation 12/17/21    Authorization Type [employer]    Authorization Time Period 10/22/21-12/17/21    Progress Note Due on Visit 100    PT Start Time 1348    PT Stop Time 1430    PT Time Calculation (min) 42 min    Equipment Utilized During Treatment Gait belt    Activity Tolerance Patient tolerated treatment well;No increased pain    Behavior During Therapy WFL for tasks assessed/performed              Past Medical History:  Diagnosis Date   Anemia    Cough    RESOLVING, NO FEVER   Diabetes mellitus without complication ([place])    Gout    Hypertension    Hypothyroidism    PUD (peptic ulcer disease)    Stroke [facility])    Wears dentures    full upper, partial lower   Past Surgical History:  Procedure Laterality Date   AMPUTATION TOE Left 12/14/2017   Procedure: AMPUTATION TOE-4TH MPJ;  Surgeon: Samara Deist, DPM;  Location: [place];  Service: Podiatry;  Laterality: Left;  IVA LOCAL Diabetic - oral meds   APPENDECTOMY     CATARACT EXTRACTION W/PHACO Right 06/23/2015   Procedure: CATARACT EXTRACTION PHACO AND INTRAOCULAR LENS PLACEMENT (IOC);  Surgeon: Estill Cotta, MD;  Location: [facility];  Service: Ophthalmology;  Laterality: Right;  [place] 01:28 AP% 23.4 CDE 36.14 fluid pack lot # [id number]   CATARACT EXTRACTION W/PHACO Left 07/28/2015   Procedure: CATARACT EXTRACTION PHACO AND INTRAOCULAR LENS PLACEMENT (IOC);  Surgeon: Estill Cotta, MD;  Location: [facility];  Service: Ophthalmology;  Laterality: Left;  [place]    1:29.7 AP%  24.9 CDE   41.32 fluid casette lot #[id number] H exp 09/23/2016   COONOSCOPY     AND ENDOSCOPY   DILATION AND CURETTAGE OF UTERUS      ENDARTERECTOMY Left 11/13/2020   Procedure: Evacuation of left neck hematoma;  Surgeon: Katha Cabal, MD;  Location: [facility];  Service: Vascular;  Laterality: Left;   ENDARTERECTOMY Left 11/13/2020   Procedure: ENDARTERECTOMY CAROTID;  Surgeon: Algernon Huxley, MD;  Location: [facility];  Service: Vascular;  Laterality: Left;   TONSILLECTOMY     TUBAL LIGATION     Patient Active Problem List   Diagnosis Date Noted   Carotid stenosis, symptomatic, with infarction ([place]) 11/13/2020   Gout flare 11/10/2020   UTI (urinary tract infection) 11/10/2020   Left carotid artery stenosis 11/10/2020   Chronic kidney disease 11/10/2020   Left basal ganglia embolic stroke ([place]) 56/21/3086   PAC (premature atrial contraction)    Pre-procedural cardiovascular examination    Ischemic stroke ([place]) 10/20/2020   TIA (transient ischemic attack) 10/19/2020   Right hemiparesis ([place]) 10/19/2020   Type 2 diabetes mellitus with stage 3 chronic kidney disease, without long-term current use of insulin ([place]) 08/24/2018   Hyperlipidemia, unspecified 07/19/2017   Hypertension 07/19/2017   PUD (peptic ulcer disease) 07/19/2017   Type 2 diabetes mellitus ([place]) 07/19/2017   Personal history of gout 08/12/2016   Anemia, unspecified 04/09/2016   Hypothyroidism, acquired 12/10/2015    REFERRING DIAG: CVA with right  hemiparesis  THERAPY DIAG:  Muscle weakness (generalized)  Other lack of coordination  Unsteadiness on feet  Other abnormalities of gait and mobility  Abnormality of gait and mobility  Rationale for Evaluation and Treatment Rehabilitation  PERTINENT HISTORY: 88 y.o. female with medical history significant for type 2 diabetes mellitus, essential hypertension, acquired hypothyroidism, hyperlipidemia, stage III chronic kidney disease reports increased right sided weakness on 10/19/20. She was diagnosed with left basal ganglia ischemic stroke. She was discharged to inpatient rehab for 10 days and then  discharged home. Patient was evaluated by outpatient PT on 11/12/20. CVA was due to carotid stenosis. She underwent left carotid endartectomy on 7/21. Procedure went well however patient suffered from left cervical hematoma and had subsequent surgical procedure to stop the bleed and remove the hematoma on 11/13/20. They were concerned with her breathing and therefore intubated her for 1 day, she was in ICU for 2 days and transferred to med/surge on 11/15/20. Patient was discharged home on 11/17/20 and is now returning to outpatient PT. She has had the stiches removed and is healing well. Denies any neck pain or stiffness. She lives with her daughter and has 24/7 caregiver support. She is still using RW for most ambulation. She is able to transfer to bedside commode well and is able to stand short time unsupported. She reports feeling very weak and fatigued. She reports she has been dragging her right foot more. She denies any numbness/tingling; She reports feeling like her legs are wanting to do more but is hesitant because of fear of falling. She is mod I for self care morning routine. She does still receive help with showering. She has been working on increasing her activity but denies any formal exercise.   PRECAUTIONS: Fall risk  SUBJECTIVE: Pt reports no pain or falls. Reports overall doing the same with no concern or complaints. Does later report using SPC at home she frequently loses balance into her walls relying on them to prevent falls.    PAIN:  Are you having pain? No   TODAY'S TREATMENT: 12/01/2021  Vitals:   BP: 170/53 mm Hg  HR: 64 BPM   Neuro Re-ED:   Amb 160' with hurry-cane. Multiple posterior and lateral LOB relying on minA from PT to correct with mod VC's for correct SPC sequencing in LUE with RLE. Primary Lob with turning corners.   Amb 160' with rollator. Performing quick, reciprocal step through gait with supervision. No LOB, notable improvement in stability. Education on benefits  of RW for reduced falls risk.   BP post ambulation bouts: 174/53 mm Hg  HR: 69 BPM   Ambulating figure 8's ~10' apart. Mod to max VC's for sequencing SPC. Poor to fair carryover. Improved turning with VC's and PT demo prior to completion.    Alternating foot taps at treadmill bar: 3x8, CGA. Initially pt demonstrating R lateral lean, decreased stance time on RLE with hard step with LLE on treadmill platform. By last set pt improving in upright posture with stance time on RLE with light foot touch onto treadmill platform.    Airex pad exercises:   Normal BOS: x1 minute  NBOS: 2x2 min. Significnat ankle strategy in frontal and sagittal plane. Intermittent minA form PT   Tandem with RLE under BOS: x1 minute.   BP post treatment: 157/57 mm Hg   PATIENT EDUCATION: Education details: Exercise technique/positioning; HEP Person educated: Patient Education method: Explanation, Demonstration, and Verbal cues Education comprehension: verbalized understanding, returned demonstration, and needs further education  HOME EXERCISE PROGRAM: No changes this session;   Instructed patient in pool exercise: Access Code: [email or web address] URL: [email or web address] Date: 10/22/2021 Prepared by: Blanche East  Exercises - Seated Knee Extension with Resistance  - 1 x daily - 7 x weekly - 2 sets - 15 reps - Forward and Backward Stepping at [employer]  - 1 x daily - 7 x weekly - 1 sets - 5 reps - Freestyle Swimming with Snorkel Mask  - 1 x daily - 7 x weekly - 1 sets - 2-3 reps - Heel Walking  - 1 x daily - 7 x weekly - 1 sets - 5 reps - Toe Walking  - 1 x daily - 7 x weekly - 1 sets - 5 reps - Single Leg Stance at Pool Wall  - 1 x daily - 7 x weekly - 1 sets - 3-5 reps - 15-30 sec hold   PT Short Term Goals -       PT SHORT TERM GOAL #1   Title Patient will be adherent to HEP at least 3x a week to improve functional strength and balance for better safety at home.    Baseline 9/21: Pt  reports compliance with HEP and is confident, 1/3: doing exercise 2x a week; 06/25/21: 3x/week, 5/18: consistent; 8/1: consistent;    Time 4    Period Weeks    Status Achieved    Target Date 06/25/21      PT SHORT TERM GOAL #2   Title Patient will be independent in transferring sit<>Stand without pushing on arm rests to improve ability to get up from chair.    Baseline 9/21: pt able to complete STS without use of UEs, 1/3:able to stand but has moderate difficulty requiring increased time; 06/25/21: able to perform indep without hands but requiring use of momentum to attain standing. Remains stable. 5/18: able to stand without UE assist, close supervision, remains stable;    Time 4    Period Weeks    Status Achieved    Target Date 07/23/21              PT Long Term Goals -      PT LONG TERM GOAL #1   Title Patient (> 28 years old) will complete five times sit to stand test in < 15 seconds indicating an increased LE strength and improved balance.    Baseline 9/21: 29 seconds hands-free 10/10: deferred 10/12: 34 sec hands free; 11/2: 23.8 sec hands-free; 12/7: 33.2 sec hands free, 1/3: 33.92 hands free; 06/25/21: 25.69 sec, 4/13: 15.5 sec hands free, 5/18: 23 sec without UE 6/22: deferred, 8/1: 15.7 sec without UE   Time 8    Period Weeks    Status Partially Met    Target Date  12/17/21     PT LONG TERM GOAL #2   Title Patient will increase six minute walk test distance to >400 feet for improve gait ability    Baseline 9/21: 368 ft with RW; 10/10: deferred 10/12: 408 ft; 11/2: 476 ft with 4WW, 1/3: 475 feet with RW; 4/13: 450 feet without AD CGA 10/15/21: 460 feet without AD, CGA   Time 8    Period Weeks    Status Achieved    Target Date 12/17/21     PT LONG TERM GOAL #3   Title Patient will increase 10 meter walk test to >1.42ms as to improve gait speed for better community ambulation and to reduce fall risk.    Baseline 9/21: 0.5  m/s with RW; 10/10 deferred 10/12: 0.93 m/s with 1VO;  11/2: 0.83 m/s with 1YW; 12/7: 0.52 m/s with RW, 1/3: 0.704; 06/25/21: 0.55 m/s with SPC. 5/18: 0.4 m/s without AD , 10/15/21 0.507 m/s without AD, 8/1: 0.64 m/s   Time 8    Period Weeks    Status Partially Met   Target Date 12/17/21     PT LONG TERM GOAL #4   Title Patient will be independent with ascend/descend 6 steps using single UE in step over step pattern without LOB.    Baseline 9/21:  Patient uses PT stairs (4 steps) with bilateral upper extremity support on handrails for both ascending/descending.  Patient exhibits reciprocal pattern ascending and step to pattern descending. 10/10: deferred; 10/12: ascending/descending recip. steps with BUE support; 11/2: Ascended/descended 8 steps total, reciprocal pattern ascending and step-to descending. Pt uses UUE support throughout with CGA assist.; 12/7: Ascended and descended 8 steps total with UUE support and CGA. Reciprocal stepping ascending and step to descending., 1/3: Deferrred; 06/25/21: able to perform with SUE support and reciprocal pattern asc/desc, but does require close CGA with descending. 5/18: independent with 2 handrails, close supervision with 1 handrai; 10/15/21: requires 2 handrails, 8/1: requires 1 handrail, descends one step at a time   Time 8    Period Weeks    Status Partially Met    Target Date 12/17/21     PT LONG TERM GOAL #5   Title Patient will demonstrate an improved Berg Balance Score of >45/56 as to demonstrate improved balance with ADLs such as sitting/standing and transfer balance and reduced fall risk.    Baseline 9/21: deferred d/t time; 9/29: 42/56; 10/10: deferred; 10/12: 44/56; 11/2: deferred; 11/7: 46/56, 1/3: deferred    Time 8    Period Weeks    Status Achieved    Target Date 12/17/21     PT LONG TERM GOAL #6   Title Patient will improve FOTO score to >60% to indicate improved functional mobility with ADLs.    Baseline 9/21: 59; 10/10: 57; 11/2: 60; 12/7: 58%, 1/3: 61%, 4/13: 56% 10/15/21: 58%, 8/1: 56%    Time 8    Period Weeks    Status Partially Met   Target Date 12/17/21     PT LONG TERM GOAL #7   Title Pt will improve FGA by a minimum of 5 points to indicate clinically significant improvement in reduced risk of falls with walking tasks.    Baseline 06/25/21: 12, 4/13: deferred, 5/18: deferred due to increased ankle discomfort; 6/22: deferred due to elevated BP- will assess next visit; 10/20/21: 8/30, 8/1: 8/30   Time 8    Period Weeks    Status Not met   Target Date 12/17/21             Plan -    Clinical Impression Statement Continuing PT POC with focus on gait and balance. Pt demonstrates significant risk of falls with cane with dynamic instability. This improves greatly with rollator. Focus on balance exercises focusing on SPC sequencing along with dynamic activities to improve balance with SPC along with SLS and unstable surfaces for further challenge to LE muscles and dynamic balance and ankle/hip/stepping strategy. Frequent seated rest breaks and monitoring of BP due to being elevated. Pt will continue to benefit from skilled PT services to reduce risk of falls with gait and ADL's.    Personal Factors and Comorbidities Age    Comorbidities HTN, CKD, fall risk, type 2 diabetes,  Examination-Activity Limitations Lift;Locomotion Level;Squat;Stairs;Stand;Toileting;Transfers;Bathing    Examination-Participation Restrictions Cleaning;Community Activity;Laundry;Meal Prep;Shop;Volunteer;Driving    Stability/Clinical Decision Making Stable/Uncomplicated    Rehab Potential Fair    PT Frequency 2x / week    PT Duration 8 weeks    PT Treatment/Interventions Cryotherapy;Electrical Stimulation;Moist Heat;Gait training;Stair training;Functional mobility training;Therapeutic activities;Therapeutic exercise;Neuromuscular re-education;Balance training;Patient/family education;Orthotic Fit/Training;Energy conservation    PT Next Visit Plan work on LE strengthening and balance; gait training,  endurance, continue POC as previously indicated    PT Home Exercise Plan No changes this date    Consulted and Agree with Plan of Care Patient               Salem Caster. Fairly IV, PT, DPT Physical Therapist- [facility]  12/01/2021, 2:44 PM

## 2021-12-03 ENCOUNTER — Ambulatory Visit: Payer: Medicare HMO | Admitting: Occupational Therapy

## 2021-12-03 ENCOUNTER — Ambulatory Visit: Payer: Medicare HMO

## 2021-12-03 ENCOUNTER — Encounter: Payer: Self-pay | Admitting: Occupational Therapy

## 2021-12-03 ENCOUNTER — Encounter: Payer: Self-pay | Admitting: Physical Therapy

## 2021-12-03 DIAGNOSIS — R269 Unspecified abnormalities of gait and mobility: Secondary | ICD-10-CM

## 2021-12-03 DIAGNOSIS — M6281 Muscle weakness (generalized): Secondary | ICD-10-CM

## 2021-12-03 DIAGNOSIS — R482 Apraxia: Secondary | ICD-10-CM

## 2021-12-03 DIAGNOSIS — R2689 Other abnormalities of gait and mobility: Secondary | ICD-10-CM

## 2021-12-03 DIAGNOSIS — R278 Other lack of coordination: Secondary | ICD-10-CM

## 2021-12-03 DIAGNOSIS — R2681 Unsteadiness on feet: Secondary | ICD-10-CM

## 2021-12-03 NOTE — Therapy (Signed)
OUTPATIENT PHYSICAL THERAPY TREATMENT NOTE    Patient Name: Cassandra Schaefer MRN: [id number] DOB:1928/07/27, 88 y.o., female Today's Date: 12/03/2021  PCP: Roetta Sessions MD REFERRING PROVIDER: Roetta Sessions MD   PT End of Session - 12/03/21 1344     Visit Number [id number]    Number of Visits 102    Date for PT Re-Evaluation 12/17/21    PT Start Time 1344    PT Stop Time 1430    PT Time Calculation (min) 46 min    Equipment Utilized During Treatment Gait belt    Activity Tolerance Patient tolerated treatment well;No increased pain    Behavior During Therapy WFL for tasks assessed/performed              Past Medical History:  Diagnosis Date   Anemia    Cough    RESOLVING, NO FEVER   Diabetes mellitus without complication ([place])    Gout    Hypertension    Hypothyroidism    PUD (peptic ulcer disease)    Stroke [facility])    Wears dentures    full upper, partial lower   Past Surgical History:  Procedure Laterality Date   AMPUTATION TOE Left 12/14/2017   Procedure: AMPUTATION TOE-4TH MPJ;  Surgeon: Samara Deist, DPM;  Location: [place];  Service: Podiatry;  Laterality: Left;  IVA LOCAL Diabetic - oral meds   APPENDECTOMY     CATARACT EXTRACTION W/PHACO Right 06/23/2015   Procedure: CATARACT EXTRACTION PHACO AND INTRAOCULAR LENS PLACEMENT (IOC);  Surgeon: Estill Cotta, MD;  Location: [facility];  Service: Ophthalmology;  Laterality: Right;  [place] 01:28 AP% 23.4 CDE 36.14 fluid pack lot # [id number]   CATARACT EXTRACTION W/PHACO Left 07/28/2015   Procedure: CATARACT EXTRACTION PHACO AND INTRAOCULAR LENS PLACEMENT (IOC);  Surgeon: Estill Cotta, MD;  Location: [facility];  Service: Ophthalmology;  Laterality: Left;  [place]    1:29.7 AP%  24.9 CDE   41.32 fluid casette lot #[id number] H exp 09/23/2016   COONOSCOPY     AND ENDOSCOPY   DILATION AND CURETTAGE OF UTERUS     ENDARTERECTOMY Left 11/13/2020   Procedure: Evacuation of left neck hematoma;  Surgeon: Katha Cabal, MD;   Location: [facility];  Service: Vascular;  Laterality: Left;   ENDARTERECTOMY Left 11/13/2020   Procedure: ENDARTERECTOMY CAROTID;  Surgeon: Algernon Huxley, MD;  Location: [facility];  Service: Vascular;  Laterality: Left;   TONSILLECTOMY     TUBAL LIGATION     Patient Active Problem List   Diagnosis Date Noted   Carotid stenosis, symptomatic, with infarction ([place]) 11/13/2020   Gout flare 11/10/2020   UTI (urinary tract infection) 11/10/2020   Left carotid artery stenosis 11/10/2020   Chronic kidney disease 11/10/2020   Left basal ganglia embolic stroke ([place]) 75/64/3329   PAC (premature atrial contraction)    Pre-procedural cardiovascular examination    Ischemic stroke ([place]) 10/20/2020   TIA (transient ischemic attack) 10/19/2020   Right hemiparesis ([place]) 10/19/2020   Type 2 diabetes mellitus with stage 3 chronic kidney disease, without long-term current use of insulin ([place]) 08/24/2018   Hyperlipidemia, unspecified 07/19/2017   Hypertension 07/19/2017   PUD (peptic ulcer disease) 07/19/2017   Type 2 diabetes mellitus ([place]) 07/19/2017   Personal history of gout 08/12/2016   Anemia, unspecified 04/09/2016   Hypothyroidism, acquired 12/10/2015    REFERRING DIAG: CVA with right hemiparesis  THERAPY DIAG:  Muscle weakness (generalized)  Other lack of coordination  Unsteadiness on feet  Other abnormalities of gait and  mobility  Abnormality of gait and mobility  Apraxia  Rationale for Evaluation and Treatment Rehabilitation  PERTINENT HISTORY: 88 y.o. female with medical history significant for type 2 diabetes mellitus, essential hypertension, acquired hypothyroidism, hyperlipidemia, stage III chronic kidney disease reports increased right sided weakness on 10/19/20. She was diagnosed with left basal ganglia ischemic stroke. She was discharged to inpatient rehab for 10 days and then discharged home. Patient was evaluated by outpatient PT on 11/12/20. CVA was due to carotid stenosis. She  underwent left carotid endartectomy on 7/21. Procedure went well however patient suffered from left cervical hematoma and had subsequent surgical procedure to stop the bleed and remove the hematoma on 11/13/20. They were concerned with her breathing and therefore intubated her for 1 day, she was in ICU for 2 days and transferred to med/surge on 11/15/20. Patient was discharged home on 11/17/20 and is now returning to outpatient PT. She has had the stiches removed and is healing well. Denies any neck pain or stiffness. She lives with her daughter and has 24/7 caregiver support. She is still using RW for most ambulation. She is able to transfer to bedside commode well and is able to stand short time unsupported. She reports feeling very weak and fatigued. She reports she has been dragging her right foot more. She denies any numbness/tingling; She reports feeling like her legs are wanting to do more but is hesitant because of fear of falling. She is mod I for self care morning routine. She does still receive help with showering. She has been working on increasing her activity but denies any formal exercise.   PRECAUTIONS: Fall risk  SUBJECTIVE: Pt reports no pain or falls. Reports taking new BP meds for fourth day straight and has been better controlled. No soreness or pain after last session.    PAIN:  Are you having pain? No   TODAY'S TREATMENT: 12/03/2021  Vitals:   BP: 162/53 mm Hg  HR:  67 BPM     Neuro Re-ED:   Amb 160' with SPC in RUE. Difficulty sequencing SPC ( in L hand) with RLE varying from correct use with step through reciprocal 2 point pattern to 3 point step to pattern and on occasion holding SPC in air not using with R steps in general. Poor foot clearance bilaterally with scuffing of feet. Improved stability with turns compared to prior session however.    Seated rest with education on 3 point step to pattern with PT demo.     Amb 160' with good carryover in 3 point step to pattern.  Towards end of bout pt progressing to reciprocal step to pattern however is at a slowed, cautious gait pattern. CGA. Initial VC's for sequencing until pt demonstrating understanding of motor task.   Obstacle course:    Navigating  2 x 1/2 bolsters --> cone weaves (6 cones), x4 laps, CGA. Min VC's for [facility] placement closer to BOS for improved stability. Demonstrating correct sequencing of SPC and steps improving balance with turns compared to previous session.   Stepping onto unstable surface (red mat on floor): x4 laps, CGA. Intermittent posterior LOB relying on hip strategy to correct (x2, able to without PT assist). VC's for improved R hip flexion to improve foot clearance.     STS with LLE on airex pad: 2x6, CGA, VC's for anterior weight shift and eccentric control with descent. Good carryover post cues. Does demo poor eccentric control towards end of descent (last ~1-2" to standard height chair).  Vitals post treatment: BP: 163/52 mm Hg  HR: 75 BPM     PATIENT EDUCATION: Education details: Exercise technique/positioning; HEP Person educated: Patient Education method: Explanation, Demonstration, and Verbal cues Education comprehension: verbalized understanding, returned demonstration, and needs further education   HOME EXERCISE PROGRAM: No changes this session;   Instructed patient in pool exercise: Access Code: [email or web address] URL: [email or web address] Date: 10/22/2021 Prepared by: Blanche East  Exercises - Seated Knee Extension with Resistance  - 1 x daily - 7 x weekly - 2 sets - 15 reps - Forward and Backward Stepping at [employer]  - 1 x daily - 7 x weekly - 1 sets - 5 reps - Freestyle Swimming with Snorkel Mask  - 1 x daily - 7 x weekly - 1 sets - 2-3 reps - Heel Walking  - 1 x daily - 7 x weekly - 1 sets - 5 reps - Toe Walking  - 1 x daily - 7 x weekly - 1 sets - 5 reps - Single Leg Stance at Pool Wall  - 1 x daily - 7 x weekly - 1 sets - 3-5 reps - 15-30 sec  hold   PT Short Term Goals -       PT SHORT TERM GOAL #1   Title Patient will be adherent to HEP at least 3x a week to improve functional strength and balance for better safety at home.    Baseline 9/21: Pt reports compliance with HEP and is confident, 1/3: doing exercise 2x a week; 06/25/21: 3x/week, 5/18: consistent; 8/1: consistent;    Time 4    Period Weeks    Status Achieved    Target Date 06/25/21      PT SHORT TERM GOAL #2   Title Patient will be independent in transferring sit<>Stand without pushing on arm rests to improve ability to get up from chair.    Baseline 9/21: pt able to complete STS without use of UEs, 1/3:able to stand but has moderate difficulty requiring increased time; 06/25/21: able to perform indep without hands but requiring use of momentum to attain standing. Remains stable. 5/18: able to stand without UE assist, close supervision, remains stable;    Time 4    Period Weeks    Status Achieved    Target Date 07/23/21              PT Long Term Goals -      PT LONG TERM GOAL #1   Title Patient (> 52 years old) will complete five times sit to stand test in < 15 seconds indicating an increased LE strength and improved balance.    Baseline 9/21: 29 seconds hands-free 10/10: deferred 10/12: 34 sec hands free; 11/2: 23.8 sec hands-free; 12/7: 33.2 sec hands free, 1/3: 33.92 hands free; 06/25/21: 25.69 sec, 4/13: 15.5 sec hands free, 5/18: 23 sec without UE 6/22: deferred, 8/1: 15.7 sec without UE   Time 8    Period Weeks    Status Partially Met    Target Date  12/17/21     PT LONG TERM GOAL #2   Title Patient will increase six minute walk test distance to >400 feet for improve gait ability    Baseline 9/21: 368 ft with RW; 10/10: deferred 10/12: 408 ft; 11/2: 476 ft with 4WW, 1/3: 475 feet with RW; 4/13: 450 feet without AD CGA 10/15/21: 460 feet without AD, CGA   Time 8    Period Weeks    Status Achieved  Target Date 12/17/21     PT LONG TERM GOAL #3    Title Patient will increase 10 meter walk test to >1.79ms as to improve gait speed for better community ambulation and to reduce fall risk.    Baseline 9/21: 0.5 m/s with RW; 10/10 deferred 10/12: 0.93 m/s with 41OX 11/2: 0.83 m/s with 40RU 12/7: 0.52 m/s with RW, 1/3: 0.704; 06/25/21: 0.55 m/s with SPC. 5/18: 0.4 m/s without AD , 10/15/21 0.507 m/s without AD, 8/1: 0.64 m/s   Time 8    Period Weeks    Status Partially Met   Target Date 12/17/21     PT LONG TERM GOAL #4   Title Patient will be independent with ascend/descend 6 steps using single UE in step over step pattern without LOB.    Baseline 9/21:  Patient uses PT stairs (4 steps) with bilateral upper extremity support on handrails for both ascending/descending.  Patient exhibits reciprocal pattern ascending and step to pattern descending. 10/10: deferred; 10/12: ascending/descending recip. steps with BUE support; 11/2: Ascended/descended 8 steps total, reciprocal pattern ascending and step-to descending. Pt uses UUE support throughout with CGA assist.; 12/7: Ascended and descended 8 steps total with UUE support and CGA. Reciprocal stepping ascending and step to descending., 1/3: Deferrred; 06/25/21: able to perform with SUE support and reciprocal pattern asc/desc, but does require close CGA with descending. 5/18: independent with 2 handrails, close supervision with 1 handrai; 10/15/21: requires 2 handrails, 8/1: requires 1 handrail, descends one step at a time   Time 8    Period Weeks    Status Partially Met    Target Date 12/17/21     PT LONG TERM GOAL #5   Title Patient will demonstrate an improved Berg Balance Score of >45/56 as to demonstrate improved balance with ADLs such as sitting/standing and transfer balance and reduced fall risk.    Baseline 9/21: deferred d/t time; 9/29: 42/56; 10/10: deferred; 10/12: 44/56; 11/2: deferred; 11/7: 46/56, 1/3: deferred    Time 8    Period Weeks    Status Achieved    Target Date 12/17/21     PT LONG  TERM GOAL #6   Title Patient will improve FOTO score to >60% to indicate improved functional mobility with ADLs.    Baseline 9/21: 59; 10/10: 57; 11/2: 60; 12/7: 58%, 1/3: 61%, 4/13: 56% 10/15/21: 58%, 8/1: 56%   Time 8    Period Weeks    Status Partially Met   Target Date 12/17/21     PT LONG TERM GOAL #7   Title Pt will improve FGA by a minimum of 5 points to indicate clinically significant improvement in reduced risk of falls with walking tasks.    Baseline 06/25/21: 12, 4/13: deferred, 5/18: deferred due to increased ankle discomfort; 6/22: deferred due to elevated BP- will assess next visit; 10/20/21: 8/30, 8/1: 8/30   Time 8    Period Weeks    Status Not met   Target Date 12/17/21             Plan -    Clinical Impression Statement Continuing PT POC with focus on gait and balance with SPC. Heavy focus of session on improved safe use of SPC with good in session carryover with ability to adequately progress dynamic challenges with turning and foot clearance activities. Still reliant on min to mod VC's with primarily SPC proximity for improved stability. Pt will continue to benefit from skilled PT services to progress strength, gait, and balance  to reduce risk of falls and optimize independence in functional mobility.    Personal Factors and Comorbidities Age    Comorbidities HTN, CKD, fall risk, type 2 diabetes,    Examination-Activity Limitations Lift;Locomotion Level;Squat;Stairs;Stand;Toileting;Transfers;Bathing    Examination-Participation Restrictions Cleaning;Community Activity;Laundry;Meal Prep;Shop;Volunteer;Driving    Stability/Clinical Decision Making Stable/Uncomplicated    Rehab Potential Fair    PT Frequency 2x / week    PT Duration 8 weeks    PT Treatment/Interventions Cryotherapy;Electrical Stimulation;Moist Heat;Gait training;Stair training;Functional mobility training;Therapeutic activities;Therapeutic exercise;Neuromuscular re-education;Balance  training;Patient/family education;Orthotic Fit/Training;Energy conservation    PT Next Visit Plan work on LE strengthening and balance; gait training, endurance, continue POC as previously indicated    PT Home Exercise Plan No changes this date    Consulted and Agree with Plan of Care Patient               Salem Caster. Fairly IV, PT, DPT Physical Therapist- [facility]  12/03/2021, 2:54 PM

## 2021-12-03 NOTE — Therapy (Addendum)
OUTPATIENT OCCUPATIONAL THERAPY TREATMENT NOTE   Patient Name: Cassandra Schaefer MRN: [id number] DOB:Aug 26, 1928, 88 y.o., female Today's Date: 12/03/2021   REFERRING PROVIDER: Tracie Harrier, MD    OT End of Session - 12/03/21 1314     Visit Number 79    Number of Visits 114    Date for OT Re-Evaluation 12/28/21    Authorization Time Period Reporting period starting 09/03/2021    OT Start Time 1300    OT Stop Time 1342   OT Time Calculation (min) 42 min    Activity Tolerance Patient tolerated treatment well    Behavior During Therapy WFL for tasks assessed/performed                    Past Medical History:  Diagnosis Date   Anemia    Cough    RESOLVING, NO FEVER   Diabetes mellitus without complication ([place])    Gout    Hypertension    Hypothyroidism    PUD (peptic ulcer disease)    Stroke [facility])    Wears dentures    full upper, partial lower   Past Surgical History:  Procedure Laterality Date   AMPUTATION TOE Left 12/14/2017   Procedure: AMPUTATION TOE-4TH MPJ;  Surgeon: Samara Deist, DPM;  Location: [facility];  Service: Podiatry;  Laterality: Left;  IVA LOCAL Diabetic - oral meds   APPENDECTOMY     CATARACT EXTRACTION W/PHACO Right 06/23/2015   Procedure: CATARACT EXTRACTION PHACO AND INTRAOCULAR LENS PLACEMENT (IOC);  Surgeon: Estill Cotta, MD;  Location: [facility];  Service: Ophthalmology;  Laterality: Right;  [place] 01:28 AP% 23.4 CDE 36.14 fluid pack lot # [id number]   CATARACT EXTRACTION W/PHACO Left 07/28/2015   Procedure: CATARACT EXTRACTION PHACO AND INTRAOCULAR LENS PLACEMENT (IOC);  Surgeon: Estill Cotta, MD;  Location: [facility];  Service: Ophthalmology;  Laterality: Left;  [place]    1:29.7 AP%  24.9 CDE   41.32 fluid casette lot #[id number] H exp 09/23/2016   COONOSCOPY     AND ENDOSCOPY   DILATION AND CURETTAGE OF UTERUS     ENDARTERECTOMY Left 11/13/2020   Procedure: Evacuation of left neck hematoma;  Surgeon: Katha Cabal, MD;   Location: [facility];  Service: Vascular;  Laterality: Left;   ENDARTERECTOMY Left 11/13/2020   Procedure: ENDARTERECTOMY CAROTID;  Surgeon: Algernon Huxley, MD;  Location: [facility];  Service: Vascular;  Laterality: Left;   TONSILLECTOMY     TUBAL LIGATION     Patient Active Problem List   Diagnosis Date Noted   Carotid stenosis, symptomatic, with infarction ([place]) 11/13/2020   Gout flare 11/10/2020   UTI (urinary tract infection) 11/10/2020   Left carotid artery stenosis 11/10/2020   Chronic kidney disease 11/10/2020   Left basal ganglia embolic stroke ([place]) 29/51/8841   PAC (premature atrial contraction)    Pre-procedural cardiovascular examination    Ischemic stroke ([place]) 10/20/2020   TIA (transient ischemic attack) 10/19/2020   Right hemiparesis ([place]) 10/19/2020   Type 2 diabetes mellitus with stage 3 chronic kidney disease, without long-term current use of insulin ([place]) 08/24/2018   Hyperlipidemia, unspecified 07/19/2017   Hypertension 07/19/2017   PUD (peptic ulcer disease) 07/19/2017   Type 2 diabetes mellitus ([place]) 07/19/2017   Personal history of gout 08/12/2016   Anemia, unspecified 04/09/2016   Hypothyroidism, acquired 12/10/2015   REFERRING DIAG: CVA  THERAPY DIAG:  Muscle weakness (generalized)  Other lack of coordination  Rationale for Evaluation and Treatment Rehabilitation  PERTINENT HISTORY:   R  ankle pain, gout, T2DM, HTN, CKDIII; R/L carotid endarectomy  PRECAUTIONS: None  SUBJECTIVE: Pt. Reports doing well today  PAIN:  Are you having pain? No     OBJECTIVE:   TODAY'S TREATMENT:    Pt. worked on bilateral [facility] skills needed to grasp small resistive beads. Pt. worked on connecting the beads using a 3pt. pinch, and pt. pinch grasp. Pt. worked on disconnecting the resistive beads using a lateral pinch grasp, and 3pt. pinch grasp.  Pt. worked on translatory movements moving 1.5" circular spheres within the palm of her right hand. Pt. worked on  formulating one rotation. Pt. worked on untying rope, and large lace knots with the tips of her fingers.    Pt. presents with limited right hand [facility] skills, and continues to present with difficulty using the right hand for performing translatory movements with small objects. Pt. had difficulty initially connecting the beads, however was able to complete it with significantly less difficulty when her bilateral forearms are supported. Pt. was able untie knots with increased time required. Pt. required rest with forearm support. Pt. continues to work on improving RUE [facility], and hand functioning in order to work towards improving, and maximizing independence with ADLs, and IADL functioning.     OT Short Term Goals - 03/23/21 1657       OT SHORT TERM GOAL #1   Title Pt will perform HEP for RUE strength/coordination independently.    Baseline Eval: HEP not yet established in outpatient, but pt is working with putty from [name]; 12/04/2020; [facility] HEP initiated this day with mod vc after return demo; 01/05/2021: indep with currently program, but ongoing as pt progresses; 01/28/2021: vc to revisit and focus on grip and pinch strengthening with putty; 03/23/21: pt is indep with HEP    Time 6    Period Weeks    Status Achieved    Target Date 03/23/21              OT Long Term Goals - 10/15/21 1322       OT LONG TERM GOAL #1   Title Pt will improve hand writing to 100% legibility with R hand to be able to independently write a check.    Baseline Eval: signature is 75% legible with R hand, requiring extra time; 12/04/2020: no chan to complete.ge from eval; 01/05/2021: Printing is 100% legible, signature is 90% legible, but still requires extra time and effort.; 01/26/21: Signature is 90% legible and speed/fluidity have improved. 20th visit: 90% legible, 03/23/21: 90% legible, extra time 40th visit 90 % legibility with increased time, 50th visit: 90% legible with with increased time required 07/14/2021: 90%  legibility. 07/30/2021: 90% legibility, pt. presses too hard through the pen, and pencil. 09/03/2021: 90% legibility; 10/06/21: Increased time, but pt states she signs cards and writes short notes on a card sufficiently.  Pt states she has no need to write checks anymore as her family assists with this and she has no desire to return to this, but she would like to work on her pressure modulation.  New goal below. 10/15/2021: Pt. conitnues to require increased time to complete note cards, and corespondence.    Time 12    Period Weeks    Status Partially Met    Target Date 12/28/21      OT LONG TERM GOAL #2   Title Pt will improve R hand coordination to enable good manipulation of iphone using R dominant hand      OT LONG TERM GOAL #  3   Title Pt will improve GMC throughout RUE to enable pt to reach for and pick up ADL supplies with R dominant hand without dropping or knocking over objects.    Baseline Eval: Pt reports RUE is clumsy and easily knocks over objects when trying to reach for ADL supplies.  Pt verbalizes that her "aim is off."; 12/04/2020: pt reports slight improvement since eval and has started using her R hand to eat, but still feels clumsy; 01/05/2021: Greatly improved; pt is using silverware to eat with R hand, can comb hair with RUE, still mildly ataxic; 01/26/21: pt reports difficulty picking up objects within a small space (ie; may knock the toothpaste holder down when reaching for toothbrush), but reaching for light/larger objects is improving 20th visit: pt. continues to be mildly ataxic, however is consistently improving with motor control, and [place] while reaching targets; 03/23/21: Pt reaches with accuracy towards targets if she takes her time (pt drops and knocks things over when moving at a faster/normal pace) 04/23/2021: Pt. continues to present with difficulty with depth perception, and impaired [place].40th visit: Pt. is improving, however tends to knock over lighter weight objects.50th  visit: Pt. has improved with reaching for items without knocking them over, however continues to need work on accuracy.07/14/2021: Pt. is improving while motor control, and accuracy of reaching for objects. Pt. continues to work towards improving efficiency with reaching for objects. 07/30/2021: Pt. is able to able to reach for objects more accurately, however continues to present with limited motor control in the Right. 09/03/2021: Pt. Pt. is improving with reaching up without knocking items over. pt. is now able to stand at the sink and reach up without dropping items; 10/06/21: pt estimates she reaches with 85-90% accurate with RUE, occasional dropping. 10/15/2021: Pt. reports 85-90% accureacy, however presents with decreased accuracy the higher, or lower she reaches.    Time 12    Period Weeks    Status On-going    Target Date 12/28/21      OT LONG TERM GOAL #4   Title Pt will improve FOTO score to 68 or better to indicate measurable functional improvement.    Baseline Eval: FOTO 55; 01/05/2021: FOTO 61; 01/28/21: FOTO 61, 20th visit: 61; 03/23/21: FOTO 60 4oth visit: FOTO score: 61, 50th visit: FOTO 69 07/30/2021: FOTO 71  09/03/2021: FOTO 73; 10/06/21: 73 10/15/2021: 77    Time 12    Period Weeks    Status On-going      OT LONG TERM GOAL #6   Title Pt. will improve right hand [facility] skills to be able to indepedendently manipulate and set-up her medication/pills in the pillbox.    Baseline 07/14/2021: Pt. has difficulty manipulating small pills, and often drops them. 07/30/2021: Pt. continues to have difficulty grasping pills/medication for set-up of pillbox. 09/03/2021: Pt. is able to grasp pills from the pillbox without dropping them; 10/06/21: modified indep (with extra time to complete). 10/15/2021: Pt. reports requiring increased time to complete.    Time 12    Period Weeks    Status Partially Met    Target Date 12/28/21      OT LONG TERM GOAL #7   Title Pt will improve accuracy with graded force  through RUE during functional tasks per self report.    Baseline 10/06/21: Pt reports she holds her greatgrandchild too tightly, scratches her arm too forcefully, and sometimes writes with too much pressure; pt struggles to grade force for functional activities.10/15/2021: Pt. continues to grasp items  with the left hand too tightly.    Time 12    Period Weeks    Status Achieved              Plan - 10/22/21 1311     Clinical Impression Statement Pt. presents with limited right hand [facility] skills, and continues to present with difficulty using the right hand for performing translatory movements with small objects. Pt. had difficulty initially connecting the beads, however was able to complete it with significantly less difficulty when her bilateral forearms are supported. Pt. was able untie knots with increased time required. Pt. required rest with forearm support. Pt. continues to work on improving RUE [facility], and hand functioning in order to work towards improving, and maximizing independence with ADLs, and IADL functioning.       OT Occupational Profile and History Detailed Assessment- Review of Records and additional review of physical, cognitive, psychosocial history related to current functional performance    Occupational performance deficits (Please refer to evaluation for details): ADL's;Leisure;IADL's    Body Structure / Function / Physical Skills ADL;Coordination;Endurance;GMC;UE functional use;Balance;Sensation;Body mechanics;IADL;Pain;Dexterity;FMC;Strength;Gait;Mobility    Rehab Potential Good    Clinical Decision Making Several treatment options, min-mod task modification necessary    Comorbidities Affecting Occupational Performance: May have comorbidities impacting occupational performance    Modification or Assistance to Complete Evaluation  Min-Moderate modification of tasks or assist with assess necessary to complete eval    OT Frequency 2x / week    OT Duration 12 weeks    OT  Treatment/Interventions Self-care/ADL training;Therapeutic exercise;DME and/or AE instruction;Functional Mobility Training;Balance training;Neuromuscular education;Therapeutic activities;Patient/family education    Family Member Consulted daughter, Emi Holes Loop, MS, OTR/L  Harrel Carina, OT 12/03/2021, 1:17 PM

## 2021-12-08 ENCOUNTER — Encounter: Payer: Self-pay | Admitting: Occupational Therapy

## 2021-12-08 ENCOUNTER — Ambulatory Visit: Payer: Medicare HMO

## 2021-12-08 ENCOUNTER — Ambulatory Visit: Payer: Medicare HMO | Admitting: Occupational Therapy

## 2021-12-08 DIAGNOSIS — R262 Difficulty in walking, not elsewhere classified: Secondary | ICD-10-CM

## 2021-12-08 DIAGNOSIS — M6281 Muscle weakness (generalized): Secondary | ICD-10-CM

## 2021-12-08 DIAGNOSIS — R278 Other lack of coordination: Secondary | ICD-10-CM

## 2021-12-08 DIAGNOSIS — I639 Cerebral infarction, unspecified: Secondary | ICD-10-CM

## 2021-12-08 DIAGNOSIS — R2681 Unsteadiness on feet: Secondary | ICD-10-CM

## 2021-12-08 DIAGNOSIS — R269 Unspecified abnormalities of gait and mobility: Secondary | ICD-10-CM

## 2021-12-08 DIAGNOSIS — R4701 Aphasia: Secondary | ICD-10-CM

## 2021-12-08 DIAGNOSIS — R482 Apraxia: Secondary | ICD-10-CM

## 2021-12-08 DIAGNOSIS — R471 Dysarthria and anarthria: Secondary | ICD-10-CM

## 2021-12-08 DIAGNOSIS — R2689 Other abnormalities of gait and mobility: Secondary | ICD-10-CM

## 2021-12-08 NOTE — Therapy (Addendum)
OUTPATIENT OCCUPATIONAL THERAPY TREATMENT NOTE   Patient Name: Cassandra Schaefer MRN: [id number] DOB:Jun 25, 1928, 88 y.o., female Today's Date: 12/08/2021   REFERRING PROVIDER: Tracie Harrier, MD   OT End of Session - 12/08/21 1319     Visit Number [id number]    Number of Visits 114    Date for OT Re-Evaluation 12/28/21    Authorization Time Period Reporting period starting 09/03/2021    OT Start Time 1303    OT Stop Time 1345    OT Time Calculation (min) 42 min    Equipment Utilized During Treatment tranport chair    Activity Tolerance Patient tolerated treatment well    Behavior During Therapy WFL for tasks assessed/performed                  Past Medical History:  Diagnosis Date   Anemia    Cough    RESOLVING, NO FEVER   Diabetes mellitus without complication ([place])    Gout    Hypertension    Hypothyroidism    PUD (peptic ulcer disease)    Stroke [facility])    Wears dentures    full upper, partial lower   Past Surgical History:  Procedure Laterality Date   AMPUTATION TOE Left 12/14/2017   Procedure: AMPUTATION TOE-4TH MPJ;  Surgeon: Samara Deist, DPM;  Location: [place];  Service: Podiatry;  Laterality: Left;  IVA LOCAL Diabetic - oral meds   APPENDECTOMY     CATARACT EXTRACTION W/PHACO Right 06/23/2015   Procedure: CATARACT EXTRACTION PHACO AND INTRAOCULAR LENS PLACEMENT (IOC);  Surgeon: Estill Cotta, MD;  Location: [facility];  Service: Ophthalmology;  Laterality: Right;  [place] 01:28 AP% 23.4 CDE 36.14 fluid pack lot # [id number]   CATARACT EXTRACTION W/PHACO Left 07/28/2015   Procedure: CATARACT EXTRACTION PHACO AND INTRAOCULAR LENS PLACEMENT (IOC);  Surgeon: Estill Cotta, MD;  Location: [facility];  Service: Ophthalmology;  Laterality: Left;  [place]    1:29.7 AP%  24.9 CDE   41.32 fluid casette lot #[id number] H exp 09/23/2016   COONOSCOPY     AND ENDOSCOPY   DILATION AND CURETTAGE OF UTERUS     ENDARTERECTOMY Left 11/13/2020   Procedure: Evacuation of left  neck hematoma;  Surgeon: Katha Cabal, MD;  Location: [facility];  Service: Vascular;  Laterality: Left;   ENDARTERECTOMY Left 11/13/2020   Procedure: ENDARTERECTOMY CAROTID;  Surgeon: Algernon Huxley, MD;  Location: [facility];  Service: Vascular;  Laterality: Left;   TONSILLECTOMY     TUBAL LIGATION     Patient Active Problem List   Diagnosis Date Noted   Carotid stenosis, symptomatic, with infarction ([place]) 11/13/2020   Gout flare 11/10/2020   UTI (urinary tract infection) 11/10/2020   Left carotid artery stenosis 11/10/2020   Chronic kidney disease 11/10/2020   Left basal ganglia embolic stroke ([place]) 44/04/270   PAC (premature atrial contraction)    Pre-procedural cardiovascular examination    Ischemic stroke ([place]) 10/20/2020   TIA (transient ischemic attack) 10/19/2020   Right hemiparesis ([place]) 10/19/2020   Type 2 diabetes mellitus with stage 3 chronic kidney disease, without long-term current use of insulin ([place]) 08/24/2018   Hyperlipidemia, unspecified 07/19/2017   Hypertension 07/19/2017   PUD (peptic ulcer disease) 07/19/2017   Type 2 diabetes mellitus ([place]) 07/19/2017   Personal history of gout 08/12/2016   Anemia, unspecified 04/09/2016   Hypothyroidism, acquired 12/10/2015   REFERRING DIAG: CVA  THERAPY DIAG:  Muscle weakness (generalized)  Other lack of coordination  Rationale for Evaluation and Treatment  Rehabilitation  PERTINENT HISTORY:   R ankle pain, gout, T2DM, HTN, CKDIII; R/L carotid endarectomy  PRECAUTIONS: None  SUBJECTIVE: Pt. Reports doing well today  PAIN:  Are you having pain? No     OBJECTIVE:   TODAY'S TREATMENT:   Pt. worked on [facility] skills using the [employer] Task. Pt. worked on sustaining grasp on the resistive tweezers while grasping this sticks, and moving them from a horizontal position to a vertical position to prepare for placing them into the pegboard placed flat at the tabletop surface. Pt. worked on [employer]. and  lateral pinch grasp. Pt. required verbal cues, and cues for visual demonstration for wrist position, and hand pattern when placing them into the pegboard.  Pt. worked on removing the 1" thin sticks while alternating thumb opposition to the tip of the 2nd digit.    Pt. required visual cues, and cues for visual demonstration for 3pt grasp patterns, and lateral pinch pattern on the tweezers. Pt. required rest with forearm support.  Pt. Required multiple rest breaks in proprioceptive position with her right hand flat at the tabletop. Pt. continues to work on improving RUE [facility], and hand functioning in order to work towards improving, and maximizing independence with ADLs, and IADL functioning.     OT Short Term Goals - 03/23/21 1657       OT SHORT TERM GOAL #1   Title Pt will perform HEP for RUE strength/coordination independently.    Baseline Eval: HEP not yet established in outpatient, but pt is working with putty from [name]; 12/04/2020; [facility] HEP initiated this day with mod vc after return demo; 01/05/2021: indep with currently program, but ongoing as pt progresses; 01/28/2021: vc to revisit and focus on grip and pinch strengthening with putty; 03/23/21: pt is indep with HEP    Time 6    Period Weeks    Status Achieved    Target Date 03/23/21              OT Long Term Goals - 10/15/21 1322       OT LONG TERM GOAL #1   Title Pt will improve hand writing to 100% legibility with R hand to be able to independently write a check.    Baseline Eval: signature is 75% legible with R hand, requiring extra time; 12/04/2020: no chan to complete.ge from eval; 01/05/2021: Printing is 100% legible, signature is 90% legible, but still requires extra time and effort.; 01/26/21: Signature is 90% legible and speed/fluidity have improved. 20th visit: 90% legible, 03/23/21: 90% legible, extra time 40th visit 90 % legibility with increased time, 50th visit: 90% legible with with increased time required 07/14/2021: 90%  legibility. 07/30/2021: 90% legibility, pt. presses too hard through the pen, and pencil. 09/03/2021: 90% legibility; 10/06/21: Increased time, but pt states she signs cards and writes short notes on a card sufficiently.  Pt states she has no need to write checks anymore as her family assists with this and she has no desire to return to this, but she would like to work on her pressure modulation.  New goal below. 10/15/2021: Pt. conitnues to require increased time to complete note cards, and corespondence.    Time 12    Period Weeks    Status Partially Met    Target Date 12/28/21      OT LONG TERM GOAL #2   Title Pt will improve R hand coordination to enable good manipulation of iphone using R dominant hand  OT LONG TERM GOAL #3   Title Pt will improve GMC throughout RUE to enable pt to reach for and pick up ADL supplies with R dominant hand without dropping or knocking over objects.    Baseline Eval: Pt reports RUE is clumsy and easily knocks over objects when trying to reach for ADL supplies.  Pt verbalizes that her "aim is off."; 12/04/2020: pt reports slight improvement since eval and has started using her R hand to eat, but still feels clumsy; 01/05/2021: Greatly improved; pt is using silverware to eat with R hand, can comb hair with RUE, still mildly ataxic; 01/26/21: pt reports difficulty picking up objects within a small space (ie; may knock the toothpaste holder down when reaching for toothbrush), but reaching for light/larger objects is improving 20th visit: pt. continues to be mildly ataxic, however is consistently improving with motor control, and [place] while reaching targets; 03/23/21: Pt reaches with accuracy towards targets if she takes her time (pt drops and knocks things over when moving at a faster/normal pace) 04/23/2021: Pt. continues to present with difficulty with depth perception, and impaired [place].40th visit: Pt. is improving, however tends to knock over lighter weight objects.50th  visit: Pt. has improved with reaching for items without knocking them over, however continues to need work on accuracy.07/14/2021: Pt. is improving while motor control, and accuracy of reaching for objects. Pt. continues to work towards improving efficiency with reaching for objects. 07/30/2021: Pt. is able to able to reach for objects more accurately, however continues to present with limited motor control in the Right. 09/03/2021: Pt. Pt. is improving with reaching up without knocking items over. pt. is now able to stand at the sink and reach up without dropping items; 10/06/21: pt estimates she reaches with 85-90% accurate with RUE, occasional dropping. 10/15/2021: Pt. reports 85-90% accureacy, however presents with decreased accuracy the higher, or lower she reaches.    Time 12    Period Weeks    Status On-going    Target Date 12/28/21      OT LONG TERM GOAL #4   Title Pt will improve FOTO score to 68 or better to indicate measurable functional improvement.    Baseline Eval: FOTO 55; 01/05/2021: FOTO 61; 01/28/21: FOTO 61, 20th visit: 61; 03/23/21: FOTO 60 4oth visit: FOTO score: 61, 50th visit: FOTO 69 07/30/2021: FOTO 71  09/03/2021: FOTO 73; 10/06/21: 73 10/15/2021: 77    Time 12    Period Weeks    Status On-going      OT LONG TERM GOAL #6   Title Pt. will improve right hand [facility] skills to be able to indepedendently manipulate and set-up her medication/pills in the pillbox.    Baseline 07/14/2021: Pt. has difficulty manipulating small pills, and often drops them. 07/30/2021: Pt. continues to have difficulty grasping pills/medication for set-up of pillbox. 09/03/2021: Pt. is able to grasp pills from the pillbox without dropping them; 10/06/21: modified indep (with extra time to complete). 10/15/2021: Pt. reports requiring increased time to complete.    Time 12    Period Weeks    Status Partially Met    Target Date 12/28/21      OT LONG TERM GOAL #7   Title Pt will improve accuracy with graded force  through RUE during functional tasks per self report.    Baseline 10/06/21: Pt reports she holds her greatgrandchild too tightly, scratches her arm too forcefully, and sometimes writes with too much pressure; pt struggles to grade force for functional activities.10/15/2021: Pt.  continues to grasp items with the left hand too tightly.    Time 12    Period Weeks    Status Achieved              Plan - 10/22/21 1311     Clinical Impression Statement Pt. required visual cues, and cues for visual demonstration for 3pt grasp patterns, and lateral pinch pattern on the tweezers. Pt. required rest with forearm support.  Pt. Required multiple rest breaks in proprioceptive position with her right hand flat at the tabletop. Pt. continues to work on improving RUE [facility], and hand functioning in order to work towards improving, and maximizing independence with ADLs, and IADL functioning.         OT Occupational Profile and History Detailed Assessment- Review of Records and additional review of physical, cognitive, psychosocial history related to current functional performance    Occupational performance deficits (Please refer to evaluation for details): ADL's;Leisure;IADL's    Body Structure / Function / Physical Skills ADL;Coordination;Endurance;GMC;UE functional use;Balance;Sensation;Body mechanics;IADL;Pain;Dexterity;FMC;Strength;Gait;Mobility    Rehab Potential Good    Clinical Decision Making Several treatment options, min-mod task modification necessary    Comorbidities Affecting Occupational Performance: May have comorbidities impacting occupational performance    Modification or Assistance to Complete Evaluation  Min-Moderate modification of tasks or assist with assess necessary to complete eval    OT Frequency 2x / week    OT Duration 12 weeks    OT Treatment/Interventions Self-care/ADL training;Therapeutic exercise;DME and/or AE instruction;Functional Mobility Training;Balance training;Neuromuscular  education;Therapeutic activities;Patient/family education    Family Member Consulted daughter, Emi Holes [place], MS, OTR/L  Harrel Carina, OT 12/08/2021, 3:01 PM

## 2021-12-08 NOTE — Therapy (Signed)
OUTPATIENT PHYSICAL THERAPY TREATMENT NOTE    Patient Name: Cassandra Schaefer MRN: [id number] DOB:1928/11/30, 88 y.o., female Today's Date: 12/08/2021  PCP: Roetta Sessions MD REFERRING PROVIDER: Roetta Sessions MD   PT End of Session - 12/08/21 1348     Visit Number 94    Number of Visits 102    Date for PT Re-Evaluation 12/17/21    Authorization Type [employer]    Authorization Time Period 10/22/21-12/17/21    Progress Note Due on Visit 100    PT Start Time 1344    PT Stop Time 1425    PT Time Calculation (min) 41 min    Equipment Utilized During Treatment Gait belt    Activity Tolerance Patient tolerated treatment well;No increased pain    Behavior During Therapy WFL for tasks assessed/performed               Past Medical History:  Diagnosis Date   Anemia    Cough    RESOLVING, NO FEVER   Diabetes mellitus without complication ([place])    Gout    Hypertension    Hypothyroidism    PUD (peptic ulcer disease)    Stroke [facility])    Wears dentures    full upper, partial lower   Past Surgical History:  Procedure Laterality Date   AMPUTATION TOE Left 12/14/2017   Procedure: AMPUTATION TOE-4TH MPJ;  Surgeon: Samara Deist, DPM;  Location: [place];  Service: Podiatry;  Laterality: Left;  IVA LOCAL Diabetic - oral meds   APPENDECTOMY     CATARACT EXTRACTION W/PHACO Right 06/23/2015   Procedure: CATARACT EXTRACTION PHACO AND INTRAOCULAR LENS PLACEMENT (IOC);  Surgeon: Estill Cotta, MD;  Location: [facility];  Service: Ophthalmology;  Laterality: Right;  [place] 01:28 AP% 23.4 CDE 36.14 fluid pack lot # [id number]   CATARACT EXTRACTION W/PHACO Left 07/28/2015   Procedure: CATARACT EXTRACTION PHACO AND INTRAOCULAR LENS PLACEMENT (IOC);  Surgeon: Estill Cotta, MD;  Location: [facility];  Service: Ophthalmology;  Laterality: Left;  [place]    1:29.7 AP%  24.9 CDE   41.32 fluid casette lot #[id number] H exp 09/23/2016   COONOSCOPY     AND ENDOSCOPY   DILATION AND CURETTAGE OF UTERUS      ENDARTERECTOMY Left 11/13/2020   Procedure: Evacuation of left neck hematoma;  Surgeon: Katha Cabal, MD;  Location: [facility];  Service: Vascular;  Laterality: Left;   ENDARTERECTOMY Left 11/13/2020   Procedure: ENDARTERECTOMY CAROTID;  Surgeon: Algernon Huxley, MD;  Location: [facility];  Service: Vascular;  Laterality: Left;   TONSILLECTOMY     TUBAL LIGATION     Patient Active Problem List   Diagnosis Date Noted   Carotid stenosis, symptomatic, with infarction ([place]) 11/13/2020   Gout flare 11/10/2020   UTI (urinary tract infection) 11/10/2020   Left carotid artery stenosis 11/10/2020   Chronic kidney disease 11/10/2020   Left basal ganglia embolic stroke ([place]) 81/82/9937   PAC (premature atrial contraction)    Pre-procedural cardiovascular examination    Ischemic stroke ([place]) 10/20/2020   TIA (transient ischemic attack) 10/19/2020   Right hemiparesis ([place]) 10/19/2020   Type 2 diabetes mellitus with stage 3 chronic kidney disease, without long-term current use of insulin ([place]) 08/24/2018   Hyperlipidemia, unspecified 07/19/2017   Hypertension 07/19/2017   PUD (peptic ulcer disease) 07/19/2017   Type 2 diabetes mellitus ([place]) 07/19/2017   Personal history of gout 08/12/2016   Anemia, unspecified 04/09/2016   Hypothyroidism, acquired 12/10/2015    REFERRING DIAG: CVA with  right hemiparesis  THERAPY DIAG:  Muscle weakness (generalized)  Other lack of coordination  Unsteadiness on feet  Apraxia  Abnormality of gait and mobility  Other abnormalities of gait and mobility  Difficulty in walking, not elsewhere classified  Left basal ganglia embolic stroke [facility])  Aphasia  Dysarthria and anarthria  Rationale for Evaluation and Treatment Rehabilitation  PERTINENT HISTORY: 88 y.o. female with medical history significant for type 2 diabetes mellitus, essential hypertension, acquired hypothyroidism, hyperlipidemia, stage III chronic kidney disease reports increased right  sided weakness on 10/19/20. She was diagnosed with left basal ganglia ischemic stroke. She was discharged to inpatient rehab for 10 days and then discharged home. Patient was evaluated by outpatient PT on 11/12/20. CVA was due to carotid stenosis. She underwent left carotid endartectomy on 7/21. Procedure went well however patient suffered from left cervical hematoma and had subsequent surgical procedure to stop the bleed and remove the hematoma on 11/13/20. They were concerned with her breathing and therefore intubated her for 1 day, she was in ICU for 2 days and transferred to med/surge on 11/15/20. Patient was discharged home on 11/17/20 and is now returning to outpatient PT. She has had the stiches removed and is healing well. Denies any neck pain or stiffness. She lives with her daughter and has 24/7 caregiver support. She is still using RW for most ambulation. She is able to transfer to bedside commode well and is able to stand short time unsupported. She reports feeling very weak and fatigued. She reports she has been dragging her right foot more. She denies any numbness/tingling; She reports feeling like her legs are wanting to do more but is hesitant because of fear of falling. She is mod I for self care morning routine. She does still receive help with showering. She has been working on increasing her activity but denies any formal exercise.   PRECAUTIONS: Fall risk  SUBJECTIVE: Pt reports no pain or falls. Reports taking new BP meds for fourth day straight and has been better controlled. No soreness or pain after last session. Has been doing a lot of HEP interventions in the pool which has been going well.   PAIN:  Are you having pain? No   TODAY'S TREATMENT: 12/08/2021  Vitals:   BP: 154/58 mmHg  HR:  70 BPM     Neuro Re-ED:   Amb 135' with SPC in LUE. X2 LOB to left/posterior requiring increased assist form PT for correction. Intermittent catching of R foot with swing. Improved cadence  noted with repetition. PT to assist with AAROM of RUE for reciprocal arm swing.   Seated rest with education on reciprocal arm swing.   Amb 135' with good carryover in 3 point step to pattern. CGA for safety. X1 lateral/posterior LOB demonstrating improved ability for correction. Decreased R foot catching with swing.  Vitals post 2nd bout of gait: BP: 172/52 mmHg  HR: 76 BPM  Vitals post diaphragmatic breathing: BP: 156/55 mmHg  HR: 66 BPM   Stepping onto unstable surface (red mat on floor): x4 laps, CGA. X1 LOB when she turned her trunk prior to moving feet. Cued to turn feet and trunk together with turns. No LOB after cueing. Increased L calf soreness post intervention.   STS with airex pad on chair, UE support on thighs x5  STS with airex pad on chair, holding UE out in front with shoulder flexion in standing, x10  STS holding UE out in front with shoulder flexion in standing, x10  Not  performed today: STS with LLE on airex pad: 2x6, CGA, VC's for anterior weight shift and eccentric control with descent. Good carryover post cues. Does demo poor eccentric control towards end of descent (last ~1-2" to standard height chair).  Obstacle course: Navigating  2 x 1/2 bolsters --> cone weaves (6 cones), x4 laps, CGA. Min VC's for [facility] placement closer to BOS for improved stability. Demonstrating correct sequencing of SPC and steps improving balance with turns compared to previous session.      PATIENT EDUCATION: Education details: Exercise technique/positioning; HEP Person educated: Patient Education method: Explanation, Demonstration, and Verbal cues Education comprehension: verbalized understanding, returned demonstration, and needs further education   HOME EXERCISE PROGRAM: No changes this session;   Instructed patient in pool exercise: Access Code: [email or web address] URL: [email or web address] Date: 10/22/2021 Prepared by: Blanche East  Exercises - Seated Knee  Extension with Resistance  - 1 x daily - 7 x weekly - 2 sets - 15 reps - Forward and Backward Stepping at [employer]  - 1 x daily - 7 x weekly - 1 sets - 5 reps - Freestyle Swimming with Snorkel Mask  - 1 x daily - 7 x weekly - 1 sets - 2-3 reps - Heel Walking  - 1 x daily - 7 x weekly - 1 sets - 5 reps - Toe Walking  - 1 x daily - 7 x weekly - 1 sets - 5 reps - Single Leg Stance at Pool Wall  - 1 x daily - 7 x weekly - 1 sets - 3-5 reps - 15-30 sec hold   PT Short Term Goals -       PT SHORT TERM GOAL #1   Title Patient will be adherent to HEP at least 3x a week to improve functional strength and balance for better safety at home.    Baseline 9/21: Pt reports compliance with HEP and is confident, 1/3: doing exercise 2x a week; 06/25/21: 3x/week, 5/18: consistent; 8/1: consistent;    Time 4    Period Weeks    Status Achieved    Target Date 06/25/21      PT SHORT TERM GOAL #2   Title Patient will be independent in transferring sit<>Stand without pushing on arm rests to improve ability to get up from chair.    Baseline 9/21: pt able to complete STS without use of UEs, 1/3:able to stand but has moderate difficulty requiring increased time; 06/25/21: able to perform indep without hands but requiring use of momentum to attain standing. Remains stable. 5/18: able to stand without UE assist, close supervision, remains stable;    Time 4    Period Weeks    Status Achieved    Target Date 07/23/21              PT Long Term Goals -      PT LONG TERM GOAL #1   Title Patient (> 13 years old) will complete five times sit to stand test in < 15 seconds indicating an increased LE strength and improved balance.    Baseline 9/21: 29 seconds hands-free 10/10: deferred 10/12: 34 sec hands free; 11/2: 23.8 sec hands-free; 12/7: 33.2 sec hands free, 1/3: 33.92 hands free; 06/25/21: 25.69 sec, 4/13: 15.5 sec hands free, 5/18: 23 sec without UE 6/22: deferred, 8/1: 15.7 sec without UE   Time 8    Period Weeks     Status Partially Met    Target Date  12/17/21     PT LONG TERM  GOAL #2   Title Patient will increase six minute walk test distance to >400 feet for improve gait ability    Baseline 9/21: 368 ft with RW; 10/10: deferred 10/12: 408 ft; 11/2: 476 ft with 4WW, 1/3: 475 feet with RW; 4/13: 450 feet without AD CGA 10/15/21: 460 feet without AD, CGA   Time 8    Period Weeks    Status Achieved    Target Date 12/17/21     PT LONG TERM GOAL #3   Title Patient will increase 10 meter walk test to >1.1ms as to improve gait speed for better community ambulation and to reduce fall risk.    Baseline 9/21: 0.5 m/s with RW; 10/10 deferred 10/12: 0.93 m/s with 41IW 11/2: 0.83 m/s with 45YK 12/7: 0.52 m/s with RW, 1/3: 0.704; 06/25/21: 0.55 m/s with SPC. 5/18: 0.4 m/s without AD , 10/15/21 0.507 m/s without AD, 8/1: 0.64 m/s   Time 8    Period Weeks    Status Partially Met   Target Date 12/17/21     PT LONG TERM GOAL #4   Title Patient will be independent with ascend/descend 6 steps using single UE in step over step pattern without LOB.    Baseline 9/21:  Patient uses PT stairs (4 steps) with bilateral upper extremity support on handrails for both ascending/descending.  Patient exhibits reciprocal pattern ascending and step to pattern descending. 10/10: deferred; 10/12: ascending/descending recip. steps with BUE support; 11/2: Ascended/descended 8 steps total, reciprocal pattern ascending and step-to descending. Pt uses UUE support throughout with CGA assist.; 12/7: Ascended and descended 8 steps total with UUE support and CGA. Reciprocal stepping ascending and step to descending., 1/3: Deferrred; 06/25/21: able to perform with SUE support and reciprocal pattern asc/desc, but does require close CGA with descending. 5/18: independent with 2 handrails, close supervision with 1 handrai; 10/15/21: requires 2 handrails, 8/1: requires 1 handrail, descends one step at a time   Time 8    Period Weeks    Status Partially  Met    Target Date 12/17/21     PT LONG TERM GOAL #5   Title Patient will demonstrate an improved Berg Balance Score of >45/56 as to demonstrate improved balance with ADLs such as sitting/standing and transfer balance and reduced fall risk.    Baseline 9/21: deferred d/t time; 9/29: 42/56; 10/10: deferred; 10/12: 44/56; 11/2: deferred; 11/7: 46/56, 1/3: deferred    Time 8    Period Weeks    Status Achieved    Target Date 12/17/21     PT LONG TERM GOAL #6   Title Patient will improve FOTO score to >60% to indicate improved functional mobility with ADLs.    Baseline 9/21: 59; 10/10: 57; 11/2: 60; 12/7: 58%, 1/3: 61%, 4/13: 56% 10/15/21: 58%, 8/1: 56%   Time 8    Period Weeks    Status Partially Met   Target Date 12/17/21     PT LONG TERM GOAL #7   Title Pt will improve FGA by a minimum of 5 points to indicate clinically significant improvement in reduced risk of falls with walking tasks.    Baseline 06/25/21: 12, 4/13: deferred, 5/18: deferred due to increased ankle discomfort; 6/22: deferred due to elevated BP- will assess next visit; 10/20/21: 8/30, 8/1: 8/30   Time 8    Period Weeks    Status Not met   Target Date 12/17/21             Plan -  Clinical Impression Statement Pt tolerated treatment well today. Demonstrates improvements in gait with use of SPC in L hand compared to last session. Continues to present with multiple LOB with ambulation on even and uneven surfaces with SPC. With cueing for safety, pacing, step length, and decreased trunk rotation with turns, pt with less episodes of LOB. Multiple seated rest breaks for pacing and management of BP; pt able to demonstrate improvements in BP during session with rest breaks and diaphragmatic breathing. Pt will continue to benefit from skilled OPPT services to address deficits and improve overall safety with functional mobility.    Personal Factors and Comorbidities Age    Comorbidities HTN, CKD, fall risk, type 2 diabetes,     Examination-Activity Limitations Lift;Locomotion Level;Squat;Stairs;Stand;Toileting;Transfers;Bathing    Examination-Participation Restrictions Cleaning;Community Activity;Laundry;Meal Prep;Shop;Volunteer;Driving    Stability/Clinical Decision Making Stable/Uncomplicated    Rehab Potential Fair    PT Frequency 2x / week    PT Duration 8 weeks    PT Treatment/Interventions Cryotherapy;Electrical Stimulation;Moist Heat;Gait training;Stair training;Functional mobility training;Therapeutic activities;Therapeutic exercise;Neuromuscular re-education;Balance training;Patient/family education;Orthotic Fit/Training;Energy conservation    PT Next Visit Plan work on LE strengthening and balance; gait training, endurance, continue POC as previously indicated, updated HEP   PT Home Exercise Plan No changes this date    Consulted and Agree with Plan of Care Patient             Herminio Commons, PT, DPT 3:06 PM,12/08/21 Physical Therapist - [facility]

## 2021-12-10 ENCOUNTER — Ambulatory Visit: Payer: Medicare HMO | Admitting: Occupational Therapy

## 2021-12-10 ENCOUNTER — Encounter: Payer: Self-pay | Admitting: Physical Therapy

## 2021-12-10 ENCOUNTER — Ambulatory Visit: Payer: Medicare HMO | Admitting: Physical Therapy

## 2021-12-10 DIAGNOSIS — R269 Unspecified abnormalities of gait and mobility: Secondary | ICD-10-CM

## 2021-12-10 DIAGNOSIS — R278 Other lack of coordination: Secondary | ICD-10-CM

## 2021-12-10 DIAGNOSIS — M6281 Muscle weakness (generalized): Secondary | ICD-10-CM | POA: Diagnosis not present

## 2021-12-10 DIAGNOSIS — R262 Difficulty in walking, not elsewhere classified: Secondary | ICD-10-CM

## 2021-12-10 DIAGNOSIS — R2681 Unsteadiness on feet: Secondary | ICD-10-CM

## 2021-12-10 NOTE — Therapy (Signed)
OUTPATIENT OCCUPATIONAL THERAPY TREATMENT NOTE   Patient Name: Cassandra Schaefer MRN: [id number] DOB:03-14-1929, 88 y.o., female Today's Date: 12/10/2021   REFERRING PROVIDER: Tracie Harrier, MD   OT End of Session - 12/10/21 1311     Visit Number [id number]    Number of Visits 114    Date for OT Re-Evaluation 12/28/21    Authorization Time Period Reporting period starting 09/03/2021    OT Start Time 1300    OT Stop Time 1345    OT Time Calculation (min) 45 min    Activity Tolerance Patient tolerated treatment well    Behavior During Therapy WFL for tasks assessed/performed                  Past Medical History:  Diagnosis Date   Anemia    Cough    RESOLVING, NO FEVER   Diabetes mellitus without complication ([place])    Gout    Hypertension    Hypothyroidism    PUD (peptic ulcer disease)    Stroke [facility])    Wears dentures    full upper, partial lower   Past Surgical History:  Procedure Laterality Date   AMPUTATION TOE Left 12/14/2017   Procedure: AMPUTATION TOE-4TH MPJ;  Surgeon: Samara Deist, DPM;  Location: [place];  Service: Podiatry;  Laterality: Left;  IVA LOCAL Diabetic - oral meds   APPENDECTOMY     CATARACT EXTRACTION W/PHACO Right 06/23/2015   Procedure: CATARACT EXTRACTION PHACO AND INTRAOCULAR LENS PLACEMENT (IOC);  Surgeon: Estill Cotta, MD;  Location: [facility];  Service: Ophthalmology;  Laterality: Right;  [place] 01:28 AP% 23.4 CDE 36.14 fluid pack lot # [id number]   CATARACT EXTRACTION W/PHACO Left 07/28/2015   Procedure: CATARACT EXTRACTION PHACO AND INTRAOCULAR LENS PLACEMENT (IOC);  Surgeon: Estill Cotta, MD;  Location: [facility];  Service: Ophthalmology;  Laterality: Left;  [place]    1:29.7 AP%  24.9 CDE   41.32 fluid casette lot #[id number] H exp 09/23/2016   COONOSCOPY     AND ENDOSCOPY   DILATION AND CURETTAGE OF UTERUS     ENDARTERECTOMY Left 11/13/2020   Procedure: Evacuation of left neck hematoma;  Surgeon: Katha Cabal, MD;   Location: [facility];  Service: Vascular;  Laterality: Left;   ENDARTERECTOMY Left 11/13/2020   Procedure: ENDARTERECTOMY CAROTID;  Surgeon: Algernon Huxley, MD;  Location: [facility];  Service: Vascular;  Laterality: Left;   TONSILLECTOMY     TUBAL LIGATION     Patient Active Problem List   Diagnosis Date Noted   Carotid stenosis, symptomatic, with infarction ([place]) 11/13/2020   Gout flare 11/10/2020   UTI (urinary tract infection) 11/10/2020   Left carotid artery stenosis 11/10/2020   Chronic kidney disease 11/10/2020   Left basal ganglia embolic stroke ([place]) 73/71/0626   PAC (premature atrial contraction)    Pre-procedural cardiovascular examination    Ischemic stroke ([place]) 10/20/2020   TIA (transient ischemic attack) 10/19/2020   Right hemiparesis ([place]) 10/19/2020   Type 2 diabetes mellitus with stage 3 chronic kidney disease, without long-term current use of insulin ([place]) 08/24/2018   Hyperlipidemia, unspecified 07/19/2017   Hypertension 07/19/2017   PUD (peptic ulcer disease) 07/19/2017   Type 2 diabetes mellitus ([place]) 07/19/2017   Personal history of gout 08/12/2016   Anemia, unspecified 04/09/2016   Hypothyroidism, acquired 12/10/2015   REFERRING DIAG: CVA  THERAPY DIAG:  Muscle weakness (generalized)  Other lack of coordination  Rationale for Evaluation and Treatment Rehabilitation  PERTINENT HISTORY:   R ankle pain,  gout, T2DM, HTN, CKDIII; R/L carotid endarectomy  PRECAUTIONS: None  SUBJECTIVE: Pt. Reports doing well today  PAIN:  Are you having pain? No  OBJECTIVE:   TODAY'S TREATMENT:   Pt. worked on right hand [facility] skills grasping single wide resistive clips. Pt. worked on translatory movements moving the clips through her hand into position in preparation for clipping them onto vertical, and diagonal dowels. Pt. Worked on grasping extra small clips with her right hand, and placing them onto the edge of a soft moving target.   Pt. Presents with difficulty  performing translatory movements with the right hand. In preparation for placing the clips onto vertical, and diagonal dowels.  Pt. required stabilization proximally with her elbows on the tabletop surface in order to complete the task efficiently. Pt. required rest breaks in proprioceptive position with her right hand flat at the tabletop. Pt. continues to work on improving RUE [facility], and hand functioning in order to work towards improving, and maximizing independence with ADLs, and IADL functioning.     OT Short Term Goals - 03/23/21 1657       OT SHORT TERM GOAL #1   Title Pt will perform HEP for RUE strength/coordination independently.    Baseline Eval: HEP not yet established in outpatient, but pt is working with putty from [name]; 12/04/2020; [name] HEP initiated this day with mod vc after return demo; 01/05/2021: indep with currently program, but ongoing as pt progresses; 01/28/2021: vc to revisit and focus on grip and pinch strengthening with putty; 03/23/21: pt is indep with HEP    Time 6    Period Weeks    Status Achieved    Target Date 03/23/21              OT Long Term Goals - 10/15/21 1322       OT LONG TERM GOAL #1   Title Pt will improve hand writing to 100% legibility with R hand to be able to independently write a check.    Baseline Eval: signature is 75% legible with R hand, requiring extra time; 12/04/2020: no chan to complete.ge from eval; 01/05/2021: Printing is 100% legible, signature is 90% legible, but still requires extra time and effort.; 01/26/21: Signature is 90% legible and speed/fluidity have improved. 20th visit: 90% legible, 03/23/21: 90% legible, extra time 40th visit 90 % legibility with increased time, 50th visit: 90% legible with with increased time required 07/14/2021: 90% legibility. 07/30/2021: 90% legibility, pt. presses too hard through the pen, and pencil. 09/03/2021: 90% legibility; 10/06/21: Increased time, but pt states she signs cards and writes short notes on a  card sufficiently.  Pt states she has no need to write checks anymore as her family assists with this and she has no desire to return to this, but she would like to work on her pressure modulation.  New goal below. 10/15/2021: Pt. conitnues to require increased time to complete note cards, and corespondence.    Time 12    Period Weeks    Status Partially Met    Target Date 12/28/21      OT LONG TERM GOAL #2   Title Pt will improve R hand coordination to enable good manipulation of iphone using R dominant hand      OT LONG TERM GOAL #3   Title Pt will improve GMC throughout RUE to enable pt to reach for and pick up ADL supplies with R dominant hand without dropping or knocking over objects.    Baseline Eval: Pt  reports RUE is clumsy and easily knocks over objects when trying to reach for ADL supplies.  Pt verbalizes that her "aim is off."; 12/04/2020: pt reports slight improvement since eval and has started using her R hand to eat, but still feels clumsy; 01/05/2021: Greatly improved; pt is using silverware to eat with R hand, can comb hair with RUE, still mildly ataxic; 01/26/21: pt reports difficulty picking up objects within a small space (ie; may knock the toothpaste holder down when reaching for toothbrush), but reaching for light/larger objects is improving 20th visit: pt. continues to be mildly ataxic, however is consistently improving with motor control, and [place] while reaching targets; 03/23/21: Pt reaches with accuracy towards targets if she takes her time (pt drops and knocks things over when moving at a faster/normal pace) 04/23/2021: Pt. continues to present with difficulty with depth perception, and impaired [place].40th visit: Pt. is improving, however tends to knock over lighter weight objects.50th visit: Pt. has improved with reaching for items without knocking them over, however continues to need work on accuracy.07/14/2021: Pt. is improving while motor control, and accuracy of reaching for  objects. Pt. continues to work towards improving efficiency with reaching for objects. 07/30/2021: Pt. is able to able to reach for objects more accurately, however continues to present with limited motor control in the Right. 09/03/2021: Pt. Pt. is improving with reaching up without knocking items over. pt. is now able to stand at the sink and reach up without dropping items; 10/06/21: pt estimates she reaches with 85-90% accurate with RUE, occasional dropping. 10/15/2021: Pt. reports 85-90% accureacy, however presents with decreased accuracy the higher, or lower she reaches.    Time 12    Period Weeks    Status On-going    Target Date 12/28/21      OT LONG TERM GOAL #4   Title Pt will improve FOTO score to 68 or better to indicate measurable functional improvement.    Baseline Eval: FOTO 55; 01/05/2021: FOTO 61; 01/28/21: FOTO 61, 20th visit: 61; 03/23/21: FOTO 60 4oth visit: FOTO score: 61, 50th visit: FOTO 69 07/30/2021: FOTO 71  09/03/2021: FOTO 73; 10/06/21: 73 10/15/2021: 77    Time 12    Period Weeks    Status On-going      OT LONG TERM GOAL #6   Title Pt. will improve right hand [facility] skills to be able to indepedendently manipulate and set-up her medication/pills in the pillbox.    Baseline 07/14/2021: Pt. has difficulty manipulating small pills, and often drops them. 07/30/2021: Pt. continues to have difficulty grasping pills/medication for set-up of pillbox. 09/03/2021: Pt. is able to grasp pills from the pillbox without dropping them; 10/06/21: modified indep (with extra time to complete). 10/15/2021: Pt. reports requiring increased time to complete.    Time 12    Period Weeks    Status Partially Met    Target Date 12/28/21      OT LONG TERM GOAL #7   Title Pt will improve accuracy with graded force through RUE during functional tasks per self report.    Baseline 10/06/21: Pt reports she holds her greatgrandchild too tightly, scratches her arm too forcefully, and sometimes writes with too much  pressure; pt struggles to grade force for functional activities.10/15/2021: Pt. continues to grasp items with the left hand too tightly.    Time 12    Period Weeks    Status Achieved              Plan - 10/22/21  1311     Clinical Impression Statement Pt. Presents with difficulty performing translatory movements with the right hand. In preparation for placing the clips onto vertical, and diagonal dowels.  Pt. required stabilization proximally with her elbows on the tabletop surface in order to complete the task efficiently. Pt. required rest breaks in proprioceptive position with her right hand flat at the tabletop. Pt. continues to work on improving RUE [facility], and hand functioning in order to work towards improving, and maximizing independence with ADLs, and IADL functioning.         OT Occupational Profile and History Detailed Assessment- Review of Records and additional review of physical, cognitive, psychosocial history related to current functional performance    Occupational performance deficits (Please refer to evaluation for details): ADL's;Leisure;IADL's    Body Structure / Function / Physical Skills ADL;Coordination;Endurance;GMC;UE functional use;Balance;Sensation;Body mechanics;IADL;Pain;Dexterity;FMC;Strength;Gait;Mobility    Rehab Potential Good    Clinical Decision Making Several treatment options, min-mod task modification necessary    Comorbidities Affecting Occupational Performance: May have comorbidities impacting occupational performance    Modification or Assistance to Complete Evaluation  Min-Moderate modification of tasks or assist with assess necessary to complete eval    OT Frequency 2x / week    OT Duration 12 weeks    OT Treatment/Interventions Self-care/ADL training;Therapeutic exercise;DME and/or AE instruction;Functional Mobility Training;Balance training;Neuromuscular education;Therapeutic activities;Patient/family education    Family Member Consulted daughter,  Emi Holes [place], MS, OTR/L  Harrel Carina, OT 12/10/2021, 1:17 PM

## 2021-12-10 NOTE — Therapy (Signed)
OUTPATIENT PHYSICAL THERAPY TREATMENT NOTE    Patient Name: Cassandra Schaefer MRN: [id number] DOB:[birth year], 88 y.o., female Today's Date: 12/10/2021  PCP: Roetta Sessions MD REFERRING PROVIDER: Roetta Sessions MD   PT End of Session - 12/10/21 1348     Visit Number 95    Number of Visits 102    Date for PT Re-Evaluation 12/17/21    Authorization Type [employer]    Authorization Time Period 10/22/21-12/17/21    Progress Note Due on Visit 100    PT Start Time 1345    PT Stop Time 1429    PT Time Calculation (min) 44 min    Equipment Utilized During Treatment Gait belt    Activity Tolerance Patient tolerated treatment well;No increased pain    Behavior During Therapy WFL for tasks assessed/performed               Past Medical History:  Diagnosis Date   Anemia    Cough    RESOLVING, NO FEVER   Diabetes mellitus without complication ([place])    Gout    Hypertension    Hypothyroidism    PUD (peptic ulcer disease)    Stroke [facility])    Wears dentures    full upper, partial lower   Past Surgical History:  Procedure Laterality Date   AMPUTATION TOE Left 12/14/2017   Procedure: AMPUTATION TOE-4TH MPJ;  Surgeon: Samara Deist, DPM;  Location: [facility];  Service: Podiatry;  Laterality: Left;  IVA LOCAL Diabetic - oral meds   APPENDECTOMY     CATARACT EXTRACTION W/PHACO Right 06/23/2015   Procedure: CATARACT EXTRACTION PHACO AND INTRAOCULAR LENS PLACEMENT (IOC);  Surgeon: Estill Cotta, MD;  Location: [facility];  Service: Ophthalmology;  Laterality: Right;  [place] 01:28 AP% 23.4 CDE 36.14 fluid pack lot # [id number]   CATARACT EXTRACTION W/PHACO Left 07/28/2015   Procedure: CATARACT EXTRACTION PHACO AND INTRAOCULAR LENS PLACEMENT (IOC);  Surgeon: Estill Cotta, MD;  Location: [facility];  Service: Ophthalmology;  Laterality: Left;  [place]    1:29.7 AP%  24.9 CDE   41.32 fluid casette lot #[id number] H exp 09/23/2016   COONOSCOPY     AND ENDOSCOPY   DILATION AND CURETTAGE OF UTERUS      ENDARTERECTOMY Left 11/13/2020   Procedure: Evacuation of left neck hematoma;  Surgeon: Katha Cabal, MD;  Location: [facility];  Service: Vascular;  Laterality: Left;   ENDARTERECTOMY Left 11/13/2020   Procedure: ENDARTERECTOMY CAROTID;  Surgeon: Algernon Huxley, MD;  Location: [facility];  Service: Vascular;  Laterality: Left;   TONSILLECTOMY     TUBAL LIGATION     Patient Active Problem List   Diagnosis Date Noted   Carotid stenosis, symptomatic, with infarction ([place]) 11/13/2020   Gout flare 11/10/2020   UTI (urinary tract infection) 11/10/2020   Left carotid artery stenosis 11/10/2020   Chronic kidney disease 11/10/2020   Left basal ganglia embolic stroke ([place]) 78/46/9629   PAC (premature atrial contraction)    Pre-procedural cardiovascular examination    Ischemic stroke ([place]) 10/20/2020   TIA (transient ischemic attack) 10/19/2020   Right hemiparesis ([place]) 10/19/2020   Type 2 diabetes mellitus with stage 3 chronic kidney disease, without long-term current use of insulin ([place]) 08/24/2018   Hyperlipidemia, unspecified 07/19/2017   Hypertension 07/19/2017   PUD (peptic ulcer disease) 07/19/2017   Type 2 diabetes mellitus ([place]) 07/19/2017   Personal history of gout 08/12/2016   Anemia, unspecified 04/09/2016   Hypothyroidism, acquired 12/10/2015    REFERRING DIAG: CVA with  right hemiparesis  THERAPY DIAG:  Difficulty in walking, not elsewhere classified  Muscle weakness (generalized)  Other lack of coordination  Unsteadiness on feet  Abnormality of gait and mobility  Rationale for Evaluation and Treatment Rehabilitation  PERTINENT HISTORY: 88 y.o. female with medical history significant for type 2 diabetes mellitus, essential hypertension, acquired hypothyroidism, hyperlipidemia, stage III chronic kidney disease reports increased right sided weakness on 10/19/20. She was diagnosed with left basal ganglia ischemic stroke. She was discharged to inpatient rehab for 10 days  and then discharged home. Patient was evaluated by outpatient PT on 11/12/20. CVA was due to carotid stenosis. She underwent left carotid endartectomy on 7/21. Procedure went well however patient suffered from left cervical hematoma and had subsequent surgical procedure to stop the bleed and remove the hematoma on 11/13/20. They were concerned with her breathing and therefore intubated her for 1 day, she was in ICU for 2 days and transferred to med/surge on 11/15/20. Patient was discharged home on 11/17/20 and is now returning to outpatient PT. She has had the stiches removed and is healing well. Denies any neck pain or stiffness. She lives with her daughter and has 24/7 caregiver support. She is still using RW for most ambulation. She is able to transfer to bedside commode well and is able to stand short time unsupported. She reports feeling very weak and fatigued. She reports she has been dragging her right foot more. She denies any numbness/tingling; She reports feeling like her legs are wanting to do more but is hesitant because of fear of falling. She is mod I for self care morning routine. She does still receive help with showering. She has been working on increasing her activity but denies any formal exercise.   PRECAUTIONS: Fall risk  SUBJECTIVE: Pt reports no pain or falls. Reports taking new BP meds for fourth day straight and has been better controlled. No soreness or pain after last session. Has been doing a lot of HEP interventions in the pool which has been going well.   PAIN:  Are you having pain? No   TODAY'S TREATMENT: 12/10/2021  Vitals:   BP: 160/59 mmHg  HR:  69 BPM     Neuro Re-ED:   Amb 320' with SPC in LUE. X2 LOB to left/posterior with pt having the ability to correct without any intervention from therapist. Intermittent catching of R foot with swing, however much improved this session than in past.   Seated rest with education provided on proper gait mechanics and importance  of lifting R LE up to prevent scuffing.     Amb 320' with SPC and good carryover in 3 point step to pattern. CGA for safety. X2 lateral/posterior LOB with therapist . Decreased R foot catching with swing.  Backwards ambulation 120' with SPC and CGA to improve gait pattern necessary for more normalized ambulation technique.  Vitals post 2nd bout of gait: BP: 162/55 mmHg  HR: 82 BPM  Stepping onto unstable surface (red mat on floor): x4 laps, CGA.  Pt with difficulty with taking larger steps onto the surface.    STS with airex pad on chair, UE support on thighs x5     Therapeutic Exercises:  Standing gastroc stretch with UE support to reduce cramping in LE's, 30 sec bouts x3, each LE Standing soleus stretch with UE support to reduce cramping in LE's, 30 sec bouts x3, each LE  Pt given this as an exercise to perform at home as part of HEP due to  increased cramps in th LE's.      PATIENT EDUCATION: Education details: Exercise technique/positioning; HEP Person educated: Patient Education method: Explanation, Demonstration, and Verbal cues Education comprehension: verbalized understanding, returned demonstration, and needs further education   HOME EXERCISE PROGRAM: No changes this session;   Instructed patient in pool exercise: Access Code: [email or web address] URL: [email or web address] Date: 10/22/2021 Prepared by: Blanche East  Exercises - Seated Knee Extension with Resistance  - 1 x daily - 7 x weekly - 2 sets - 15 reps - Forward and Backward Stepping at [employer]  - 1 x daily - 7 x weekly - 1 sets - 5 reps - Freestyle Swimming with Snorkel Mask  - 1 x daily - 7 x weekly - 1 sets - 2-3 reps - Heel Walking  - 1 x daily - 7 x weekly - 1 sets - 5 reps - Toe Walking  - 1 x daily - 7 x weekly - 1 sets - 5 reps - Single Leg Stance at Pool Wall  - 1 x daily - 7 x weekly - 1 sets - 3-5 reps - 15-30 sec hold   PT Short Term Goals -       PT SHORT TERM GOAL #1   Title  Patient will be adherent to HEP at least 3x a week to improve functional strength and balance for better safety at home.    Baseline 9/21: Pt reports compliance with HEP and is confident, 1/3: doing exercise 2x a week; 06/25/21: 3x/week, 5/18: consistent; 8/1: consistent;    Time 4    Period Weeks    Status Achieved    Target Date 06/25/21      PT SHORT TERM GOAL #2   Title Patient will be independent in transferring sit<>Stand without pushing on arm rests to improve ability to get up from chair.    Baseline 9/21: pt able to complete STS without use of UEs, 1/3:able to stand but has moderate difficulty requiring increased time; 06/25/21: able to perform indep without hands but requiring use of momentum to attain standing. Remains stable. 5/18: able to stand without UE assist, close supervision, remains stable;    Time 4    Period Weeks    Status Achieved    Target Date 07/23/21              PT Long Term Goals -      PT LONG TERM GOAL #1   Title Patient (> 48 years old) will complete five times sit to stand test in < 15 seconds indicating an increased LE strength and improved balance.    Baseline 9/21: 29 seconds hands-free 10/10: deferred 10/12: 34 sec hands free; 11/2: 23.8 sec hands-free; 12/7: 33.2 sec hands free, 1/3: 33.92 hands free; 06/25/21: 25.69 sec, 4/13: 15.5 sec hands free, 5/18: 23 sec without UE 6/22: deferred, 8/1: 15.7 sec without UE   Time 8    Period Weeks    Status Partially Met    Target Date  12/17/21     PT LONG TERM GOAL #2   Title Patient will increase six minute walk test distance to >400 feet for improve gait ability    Baseline 9/21: 368 ft with RW; 10/10: deferred 10/12: 408 ft; 11/2: 476 ft with 4WW, 1/3: 475 feet with RW; 4/13: 450 feet without AD CGA 10/15/21: 460 feet without AD, CGA   Time 8    Period Weeks    Status Achieved    Target Date 12/17/21  PT LONG TERM GOAL #3   Title Patient will increase 10 meter walk test to >1.8ms as to improve  gait speed for better community ambulation and to reduce fall risk.    Baseline 9/21: 0.5 m/s with RW; 10/10 deferred 10/12: 0.93 m/s with 48QP 11/2: 0.83 m/s with 46PP 12/7: 0.52 m/s with RW, 1/3: 0.704; 06/25/21: 0.55 m/s with SPC. 5/18: 0.4 m/s without AD , 10/15/21 0.507 m/s without AD, 8/1: 0.64 m/s   Time 8    Period Weeks    Status Partially Met   Target Date 12/17/21     PT LONG TERM GOAL #4   Title Patient will be independent with ascend/descend 6 steps using single UE in step over step pattern without LOB.    Baseline 9/21:  Patient uses PT stairs (4 steps) with bilateral upper extremity support on handrails for both ascending/descending.  Patient exhibits reciprocal pattern ascending and step to pattern descending. 10/10: deferred; 10/12: ascending/descending recip. steps with BUE support; 11/2: Ascended/descended 8 steps total, reciprocal pattern ascending and step-to descending. Pt uses UUE support throughout with CGA assist.; 12/7: Ascended and descended 8 steps total with UUE support and CGA. Reciprocal stepping ascending and step to descending., 1/3: Deferrred; 06/25/21: able to perform with SUE support and reciprocal pattern asc/desc, but does require close CGA with descending. 5/18: independent with 2 handrails, close supervision with 1 handrai; 10/15/21: requires 2 handrails, 8/1: requires 1 handrail, descends one step at a time   Time 8    Period Weeks    Status Partially Met    Target Date 12/17/21     PT LONG TERM GOAL #5   Title Patient will demonstrate an improved Berg Balance Score of >45/56 as to demonstrate improved balance with ADLs such as sitting/standing and transfer balance and reduced fall risk.    Baseline 9/21: deferred d/t time; 9/29: 42/56; 10/10: deferred; 10/12: 44/56; 11/2: deferred; 11/7: 46/56, 1/3: deferred    Time 8    Period Weeks    Status Achieved    Target Date 12/17/21     PT LONG TERM GOAL #6   Title Patient will improve FOTO score to >60% to  indicate improved functional mobility with ADLs.    Baseline 9/21: 59; 10/10: 57; 11/2: 60; 12/7: 58%, 1/3: 61%, 4/13: 56% 10/15/21: 58%, 8/1: 56%   Time 8    Period Weeks    Status Partially Met   Target Date 12/17/21     PT LONG TERM GOAL #7   Title Pt will improve FGA by a minimum of 5 points to indicate clinically significant improvement in reduced risk of falls with walking tasks.    Baseline 06/25/21: 12, 4/13: deferred, 5/18: deferred due to increased ankle discomfort; 6/22: deferred due to elevated BP- will assess next visit; 10/20/21: 8/30, 8/1: 8/30   Time 8    Period Weeks    Status Not met   Target Date 12/17/21             Plan -    Clinical Impression Statement Pt making improvements with gait pattern and ability to tolerate increase ambulation without fatigue.  Pt also introduced to gastroc/soleus stretches during session as she notes she has been having more cramps recently due to decreased potassium consumption.  Pt noted the the stretches to assist with her pain levels and will continue them as part of HEP.  Pt will continue to benefit from skilled therapy in order to address remaining deficits.  Personal Factors and Comorbidities Age    Comorbidities HTN, CKD, fall risk, type 2 diabetes,    Examination-Activity Limitations Lift;Locomotion Level;Squat;Stairs;Stand;Toileting;Transfers;Bathing    Examination-Participation Restrictions Cleaning;Community Activity;Laundry;Meal Prep;Shop;Volunteer;Driving    Stability/Clinical Decision Making Stable/Uncomplicated    Rehab Potential Fair    PT Frequency 2x / week    PT Duration 8 weeks    PT Treatment/Interventions Cryotherapy;Electrical Stimulation;Moist Heat;Gait training;Stair training;Functional mobility training;Therapeutic activities;Therapeutic exercise;Neuromuscular re-education;Balance training;Patient/family education;Orthotic Fit/Training;Energy conservation    PT Next Visit Plan work on LE strengthening and  balance; gait training, endurance, continue POC as previously indicated, updated HEP   PT Home Exercise Plan No changes this date    Consulted and Agree with Plan of Care Patient             Gwenlyn Saran, PT, DPT 12/10/21, 4:03 PM Physical Therapist - [facility]

## 2021-12-15 ENCOUNTER — Ambulatory Visit: Payer: Medicare HMO | Admitting: Occupational Therapy

## 2021-12-15 ENCOUNTER — Encounter: Payer: Self-pay | Admitting: Physical Therapy

## 2021-12-15 ENCOUNTER — Ambulatory Visit: Payer: Medicare HMO | Admitting: Physical Therapy

## 2021-12-15 DIAGNOSIS — M6281 Muscle weakness (generalized): Secondary | ICD-10-CM | POA: Diagnosis not present

## 2021-12-15 DIAGNOSIS — R278 Other lack of coordination: Secondary | ICD-10-CM

## 2021-12-15 DIAGNOSIS — R269 Unspecified abnormalities of gait and mobility: Secondary | ICD-10-CM

## 2021-12-15 DIAGNOSIS — R262 Difficulty in walking, not elsewhere classified: Secondary | ICD-10-CM

## 2021-12-15 DIAGNOSIS — R2681 Unsteadiness on feet: Secondary | ICD-10-CM

## 2021-12-15 NOTE — Therapy (Signed)
OUTPATIENT OCCUPATIONAL THERAPY TREATMENT NOTE   Patient Name: Cassandra Schaefer MRN: [id number] DOB:30-Mar-1929, 88 y.o., female Today's Date: 12/15/2021   REFERRING PROVIDER: Tracie Harrier, MD   OT End of Session - 12/15/21 1132     Visit Number 94    Number of Visits 114    Date for OT Re-Evaluation 12/28/21    Authorization Time Period Reporting period starting 09/03/2021    OT Start Time 1302    OT Stop Time 1345    OT Time Calculation (min) 43 min    Activity Tolerance Patient tolerated treatment well    Behavior During Therapy WFL for tasks assessed/performed                  Past Medical History:  Diagnosis Date   Anemia    Cough    RESOLVING, NO FEVER   Diabetes mellitus without complication ([place])    Gout    Hypertension    Hypothyroidism    PUD (peptic ulcer disease)    Stroke [facility])    Wears dentures    full upper, partial lower   Past Surgical History:  Procedure Laterality Date   AMPUTATION TOE Left 12/14/2017   Procedure: AMPUTATION TOE-4TH MPJ;  Surgeon: Samara Deist, DPM;  Location: [place];  Service: Podiatry;  Laterality: Left;  IVA LOCAL Diabetic - oral meds   APPENDECTOMY     CATARACT EXTRACTION W/PHACO Right 06/23/2015   Procedure: CATARACT EXTRACTION PHACO AND INTRAOCULAR LENS PLACEMENT (IOC);  Surgeon: Estill Cotta, MD;  Location: [facility];  Service: Ophthalmology;  Laterality: Right;  [place] 01:28 AP% 23.4 CDE 36.14 fluid pack lot # [id number]   CATARACT EXTRACTION W/PHACO Left 07/28/2015   Procedure: CATARACT EXTRACTION PHACO AND INTRAOCULAR LENS PLACEMENT (IOC);  Surgeon: Estill Cotta, MD;  Location: [facility];  Service: Ophthalmology;  Laterality: Left;  [place]    1:29.7 AP%  24.9 CDE   41.32 fluid casette lot #[id number] H exp 09/23/2016   COONOSCOPY     AND ENDOSCOPY   DILATION AND CURETTAGE OF UTERUS     ENDARTERECTOMY Left 11/13/2020   Procedure: Evacuation of left neck hematoma;  Surgeon: Katha Cabal, MD;   Location: [facility];  Service: Vascular;  Laterality: Left;   ENDARTERECTOMY Left 11/13/2020   Procedure: ENDARTERECTOMY CAROTID;  Surgeon: Algernon Huxley, MD;  Location: [facility];  Service: Vascular;  Laterality: Left;   TONSILLECTOMY     TUBAL LIGATION     Patient Active Problem List   Diagnosis Date Noted   Carotid stenosis, symptomatic, with infarction ([place]) 11/13/2020   Gout flare 11/10/2020   UTI (urinary tract infection) 11/10/2020   Left carotid artery stenosis 11/10/2020   Chronic kidney disease 11/10/2020   Left basal ganglia embolic stroke ([place]) 12/45/8099   PAC (premature atrial contraction)    Pre-procedural cardiovascular examination    Ischemic stroke ([place]) 10/20/2020   TIA (transient ischemic attack) 10/19/2020   Right hemiparesis ([place]) 10/19/2020   Type 2 diabetes mellitus with stage 3 chronic kidney disease, without long-term current use of insulin ([place]) 08/24/2018   Hyperlipidemia, unspecified 07/19/2017   Hypertension 07/19/2017   PUD (peptic ulcer disease) 07/19/2017   Type 2 diabetes mellitus ([place]) 07/19/2017   Personal history of gout 08/12/2016   Anemia, unspecified 04/09/2016   Hypothyroidism, acquired 12/10/2015   REFERRING DIAG: CVA  THERAPY DIAG:  Muscle weakness (generalized)  Other lack of coordination  Rationale for Evaluation and Treatment Rehabilitation  PERTINENT HISTORY:   R ankle pain,  gout, T2DM, HTN, CKDIII; R/L carotid endarectomy  PRECAUTIONS: None  SUBJECTIVE: Pt. Reports doing well today  PAIN:  Are you having pain? No  OBJECTIVE:   TODAY'S TREATMENT:   Pt. worked on right hand [facility] skills grasping Boggle cubes, and repositioning them with her 2nd digit and thumb. Pt. Utilized tweezers to grasp the cubes that were especially difficult along the [employer] edge. Pt. worked on Advertising account executive of words from the [employer] in 1 min. Intervals.  Pt. Was further challenged with dual tasking completing the task while while  conversating.  Pt. was able to grasp, and turn the cubes in the center of the [place] board with increased time required. Pt. Improved with precision, and speed as the task progressed. Pt. required the use of tweezers to remove the cubes along the edges. Pt. formulated lists of 11 words in 1 min. Intervals with 75% legibility. Pt. Was able to complete the task when further challenged with dual tasking. Pt. continues to work on improving RUE [facility], and hand functioning in order to work towards improving, and maximizing independence with ADLs, and IADL functioning.      OT Short Term Goals - 03/23/21 1657       OT SHORT TERM GOAL #1   Title Pt will perform HEP for RUE strength/coordination independently.    Baseline Eval: HEP not yet established in outpatient, but pt is working with putty from [name]; 12/04/2020; [facility] HEP initiated this day with mod vc after return demo; 01/05/2021: indep with currently program, but ongoing as pt progresses; 01/28/2021: vc to revisit and focus on grip and pinch strengthening with putty; 03/23/21: pt is indep with HEP    Time 6    Period Weeks    Status Achieved    Target Date 03/23/21              OT Long Term Goals - 10/15/21 1322       OT LONG TERM GOAL #1   Title Pt will improve hand writing to 100% legibility with R hand to be able to independently write a check.    Baseline Eval: signature is 75% legible with R hand, requiring extra time; 12/04/2020: no chan to complete.ge from eval; 01/05/2021: Printing is 100% legible, signature is 90% legible, but still requires extra time and effort.; 01/26/21: Signature is 90% legible and speed/fluidity have improved. 20th visit: 90% legible, 03/23/21: 90% legible, extra time 40th visit 90 % legibility with increased time, 50th visit: 90% legible with with increased time required 07/14/2021: 90% legibility. 07/30/2021: 90% legibility, pt. presses too hard through the pen, and pencil. 09/03/2021: 90% legibility; 10/06/21: Increased  time, but pt states she signs cards and writes short notes on a card sufficiently.  Pt states she has no need to write checks anymore as her family assists with this and she has no desire to return to this, but she would like to work on her pressure modulation.  New goal below. 10/15/2021: Pt. conitnues to require increased time to complete note cards, and corespondence.    Time 12    Period Weeks    Status Partially Met    Target Date 12/28/21      OT LONG TERM GOAL #2   Title Pt will improve R hand coordination to enable good manipulation of iphone using R dominant hand      OT LONG TERM GOAL #3   Title Pt will improve GMC throughout RUE to enable pt to reach for and  pick up ADL supplies with R dominant hand without dropping or knocking over objects.    Baseline Eval: Pt reports RUE is clumsy and easily knocks over objects when trying to reach for ADL supplies.  Pt verbalizes that her "aim is off."; 12/04/2020: pt reports slight improvement since eval and has started using her R hand to eat, but still feels clumsy; 01/05/2021: Greatly improved; pt is using silverware to eat with R hand, can comb hair with RUE, still mildly ataxic; 01/26/21: pt reports difficulty picking up objects within a small space (ie; may knock the toothpaste holder down when reaching for toothbrush), but reaching for light/larger objects is improving 20th visit: pt. continues to be mildly ataxic, however is consistently improving with motor control, and [place] while reaching targets; 03/23/21: Pt reaches with accuracy towards targets if she takes her time (pt drops and knocks things over when moving at a faster/normal pace) 04/23/2021: Pt. continues to present with difficulty with depth perception, and impaired [place].40th visit: Pt. is improving, however tends to knock over lighter weight objects.50th visit: Pt. has improved with reaching for items without knocking them over, however continues to need work on accuracy.07/14/2021: Pt. is  improving while motor control, and accuracy of reaching for objects. Pt. continues to work towards improving efficiency with reaching for objects. 07/30/2021: Pt. is able to able to reach for objects more accurately, however continues to present with limited motor control in the Right. 09/03/2021: Pt. Pt. is improving with reaching up without knocking items over. pt. is now able to stand at the sink and reach up without dropping items; 10/06/21: pt estimates she reaches with 85-90% accurate with RUE, occasional dropping. 10/15/2021: Pt. reports 85-90% accureacy, however presents with decreased accuracy the higher, or lower she reaches.    Time 12    Period Weeks    Status On-going    Target Date 12/28/21      OT LONG TERM GOAL #4   Title Pt will improve FOTO score to 68 or better to indicate measurable functional improvement.    Baseline Eval: FOTO 55; 01/05/2021: FOTO 61; 01/28/21: FOTO 61, 20th visit: 61; 03/23/21: FOTO 60 4oth visit: FOTO score: 61, 50th visit: FOTO 69 07/30/2021: FOTO 71  09/03/2021: FOTO 73; 10/06/21: 73 10/15/2021: 77    Time 12    Period Weeks    Status On-going      OT LONG TERM GOAL #6   Title Pt. will improve right hand [facility] skills to be able to indepedendently manipulate and set-up her medication/pills in the pillbox.    Baseline 07/14/2021: Pt. has difficulty manipulating small pills, and often drops them. 07/30/2021: Pt. continues to have difficulty grasping pills/medication for set-up of pillbox. 09/03/2021: Pt. is able to grasp pills from the pillbox without dropping them; 10/06/21: modified indep (with extra time to complete). 10/15/2021: Pt. reports requiring increased time to complete.    Time 12    Period Weeks    Status Partially Met    Target Date 12/28/21      OT LONG TERM GOAL #7   Title Pt will improve accuracy with graded force through RUE during functional tasks per self report.    Baseline 10/06/21: Pt reports she holds her greatgrandchild too tightly, scratches her  arm too forcefully, and sometimes writes with too much pressure; pt struggles to grade force for functional activities.10/15/2021: Pt. continues to grasp items with the left hand too tightly.    Time 12    Period Weeks  Status Achieved              Plan - 10/22/21 1311     Clinical Impression Statement Pt. was able to grasp, and turn the cubes in the center of the [place] board with increased time required. Pt. Improved with precision, and speed as the task progressed. Pt. required the use of tweezers to remove the cubes along the edges. Pt. formulated lists of 11 words in 1 min. Intervals with 75% legibility. Pt. Was able to complete the task when further challenged with dual tasking. Pt. continues to work on improving RUE [facility], and hand functioning in order to work towards improving, and maximizing independence with ADLs, and IADL functioning.          OT Occupational Profile and History Detailed Assessment- Review of Records and additional review of physical, cognitive, psychosocial history related to current functional performance    Occupational performance deficits (Please refer to evaluation for details): ADL's;Leisure;IADL's    Body Structure / Function / Physical Skills ADL;Coordination;Endurance;GMC;UE functional use;Balance;Sensation;Body mechanics;IADL;Pain;Dexterity;FMC;Strength;Gait;Mobility    Rehab Potential Good    Clinical Decision Making Several treatment options, min-mod task modification necessary    Comorbidities Affecting Occupational Performance: May have comorbidities impacting occupational performance    Modification or Assistance to Complete Evaluation  Min-Moderate modification of tasks or assist with assess necessary to complete eval    OT Frequency 2x / week    OT Duration 12 weeks    OT Treatment/Interventions Self-care/ADL training;Therapeutic exercise;DME and/or AE instruction;Functional Mobility Training;Balance training;Neuromuscular education;Therapeutic  activities;Patient/family education    Family Member Consulted daughter, Satira Mccallum, MS, OTR/L  Harrel Carina, OT 12/15/2021, 11:38 AM

## 2021-12-15 NOTE — Therapy (Signed)
OUTPATIENT PHYSICAL THERAPY TREATMENT NOTE    Patient Name: Cassandra Schaefer MRN: [id number] DOB:29-May-1928, 88 y.o., female Today's Date: 12/15/2021  PCP: Roetta Sessions MD REFERRING PROVIDER: Roetta Sessions MD   PT End of Session - 12/15/21 1309     Visit Number 96    Number of Visits 102    Date for PT Re-Evaluation 12/17/21    Authorization Type [employer]    Authorization Time Period 10/22/21-12/17/21    Progress Note Due on Visit 100    PT Start Time 2956    PT Stop Time 1344    PT Time Calculation (min) 39 min    Equipment Utilized During Treatment Gait belt    Activity Tolerance Patient tolerated treatment well;No increased pain    Behavior During Therapy WFL for tasks assessed/performed                Past Medical History:  Diagnosis Date   Anemia    Cough    RESOLVING, NO FEVER   Diabetes mellitus without complication ([place])    Gout    Hypertension    Hypothyroidism    PUD (peptic ulcer disease)    Stroke [facility])    Wears dentures    full upper, partial lower   Past Surgical History:  Procedure Laterality Date   AMPUTATION TOE Left 12/14/2017   Procedure: AMPUTATION TOE-4TH MPJ;  Surgeon: Samara Deist, DPM;  Location: [facility];  Service: Podiatry;  Laterality: Left;  IVA LOCAL Diabetic - oral meds   APPENDECTOMY     CATARACT EXTRACTION W/PHACO Right 06/23/2015   Procedure: CATARACT EXTRACTION PHACO AND INTRAOCULAR LENS PLACEMENT (IOC);  Surgeon: Estill Cotta, MD;  Location: [facility];  Service: Ophthalmology;  Laterality: Right;  [place] 01:28 AP% 23.4 CDE 36.14 fluid pack lot # [id number]   CATARACT EXTRACTION W/PHACO Left 07/28/2015   Procedure: CATARACT EXTRACTION PHACO AND INTRAOCULAR LENS PLACEMENT (IOC);  Surgeon: Estill Cotta, MD;  Location: [facility];  Service: Ophthalmology;  Laterality: Left;  [place]    1:29.7 AP%  24.9 CDE   41.32 fluid casette lot #[id number] H exp 09/23/2016   COONOSCOPY     AND ENDOSCOPY   DILATION AND CURETTAGE OF  UTERUS     ENDARTERECTOMY Left 11/13/2020   Procedure: Evacuation of left neck hematoma;  Surgeon: Katha Cabal, MD;  Location: [facility];  Service: Vascular;  Laterality: Left;   ENDARTERECTOMY Left 11/13/2020   Procedure: ENDARTERECTOMY CAROTID;  Surgeon: Algernon Huxley, MD;  Location: [facility];  Service: Vascular;  Laterality: Left;   TONSILLECTOMY     TUBAL LIGATION     Patient Active Problem List   Diagnosis Date Noted   Carotid stenosis, symptomatic, with infarction ([place]) 11/13/2020   Gout flare 11/10/2020   UTI (urinary tract infection) 11/10/2020   Left carotid artery stenosis 11/10/2020   Chronic kidney disease 11/10/2020   Left basal ganglia embolic stroke ([place]) 29/52/8413   PAC (premature atrial contraction)    Pre-procedural cardiovascular examination    Ischemic stroke ([place]) 10/20/2020   TIA (transient ischemic attack) 10/19/2020   Right hemiparesis ([place]) 10/19/2020   Type 2 diabetes mellitus with stage 3 chronic kidney disease, without long-term current use of insulin ([place]) 08/24/2018   Hyperlipidemia, unspecified 07/19/2017   Hypertension 07/19/2017   PUD (peptic ulcer disease) 07/19/2017   Type 2 diabetes mellitus ([place]) 07/19/2017   Personal history of gout 08/12/2016   Anemia, unspecified 04/09/2016   Hypothyroidism, acquired 12/10/2015    REFERRING DIAG: CVA  with right hemiparesis  THERAPY DIAG:  Muscle weakness (generalized)  Difficulty in walking, not elsewhere classified  Unsteadiness on feet  Abnormality of gait and mobility  Rationale for Evaluation and Treatment Rehabilitation  PERTINENT HISTORY: 88 y.o. female with medical history significant for type 2 diabetes mellitus, essential hypertension, acquired hypothyroidism, hyperlipidemia, stage III chronic kidney disease reports increased right sided weakness on 10/19/20. She was diagnosed with left basal ganglia ischemic stroke. She was discharged to inpatient rehab for 10 days and then discharged  home. Patient was evaluated by outpatient PT on 11/12/20. CVA was due to carotid stenosis. She underwent left carotid endartectomy on 7/21. Procedure went well however patient suffered from left cervical hematoma and had subsequent surgical procedure to stop the bleed and remove the hematoma on 11/13/20. They were concerned with her breathing and therefore intubated her for 1 day, she was in ICU for 2 days and transferred to med/surge on 11/15/20. Patient was discharged home on 11/17/20 and is now returning to outpatient PT. She has had the stiches removed and is healing well. Denies any neck pain or stiffness. She lives with her daughter and has 24/7 caregiver support. She is still using RW for most ambulation. She is able to transfer to bedside commode well and is able to stand short time unsupported. She reports feeling very weak and fatigued. She reports she has been dragging her right foot more. She denies any numbness/tingling; She reports feeling like her legs are wanting to do more but is hesitant because of fear of falling. She is mod I for self care morning routine. She does still receive help with showering. She has been working on increasing her activity but denies any formal exercise.   PRECAUTIONS: Fall risk  SUBJECTIVE: Pt reports no pain or falls. Reports taking new BP meds for fourth day straight and has been better controlled. No soreness or pain after last session. Has been doing a lot of HEP interventions in the pool which has been going well.   PAIN:  Are you having pain? No   TODAY'S TREATMENT: 12/15/2021  Vitals:   BP: 158/49 mmHg  HR:  67 BPM     Neuro Re-ED:   Amb 320' with SPC in LUE. X1 LOB to left/posterior with pt having the ability to correct without any intervention from therapist. Intermittent catching of R foot with swing, particularly with left turns, with cues for attention to detail with turns R foot clearance did improve   Ambulate around 4 cones with 1/ bolster  between cone 1-2 and 3-4 to work on foot clearance and object navigation. A few errors noted without LOB ( cane sequencing) but with cues this did improve.   Ambulates over 2 x 1/2 bolsters with cues for proper sequencing, ambulates 50 feet, ambulate Backwards ambulation 72' with SPC and CGA to improve gait pattern necessary for more normalized ambulation technique, ambulates 50 feet and performs 2 x 1/2 bolsters with proper sequencing. A couple LOB noted.  Vitals post bout of gait: BP: 166/51 mmHg  HR: 81 BPM  TE   STS with airex pad on chair, UE support on thighs 2*6 -cues for maintaining balance in standing for a few seconds    Vitals post session:  BP: 157/55 HR: 73      PATIENT EDUCATION: Education details: Exercise technique/positioning; HEP Person educated: Patient Education method: Explanation, Demonstration, and Verbal cues Education comprehension: verbalized understanding, returned demonstration, and needs further education   HOME EXERCISE PROGRAM: No changes  this session;   Instructed patient in pool exercise: Access Code: [email or web address] URL: [email or web address] Date: 10/22/2021 Prepared by: Blanche East  Exercises - Seated Knee Extension with Resistance  - 1 x daily - 7 x weekly - 2 sets - 15 reps - Forward and Backward Stepping at [employer]  - 1 x daily - 7 x weekly - 1 sets - 5 reps - Freestyle Swimming with Snorkel Mask  - 1 x daily - 7 x weekly - 1 sets - 2-3 reps - Heel Walking  - 1 x daily - 7 x weekly - 1 sets - 5 reps - Toe Walking  - 1 x daily - 7 x weekly - 1 sets - 5 reps - Single Leg Stance at Pool Wall  - 1 x daily - 7 x weekly - 1 sets - 3-5 reps - 15-30 sec hold   PT Short Term Goals -       PT SHORT TERM GOAL #1   Title Patient will be adherent to HEP at least 3x a week to improve functional strength and balance for better safety at home.    Baseline 9/21: Pt reports compliance with HEP and is confident, 1/3: doing exercise 2x  a week; 06/25/21: 3x/week, 5/18: consistent; 8/1: consistent;    Time 4    Period Weeks    Status Achieved    Target Date 06/25/21      PT SHORT TERM GOAL #2   Title Patient will be independent in transferring sit<>Stand without pushing on arm rests to improve ability to get up from chair.    Baseline 9/21: pt able to complete STS without use of UEs, 1/3:able to stand but has moderate difficulty requiring increased time; 06/25/21: able to perform indep without hands but requiring use of momentum to attain standing. Remains stable. 5/18: able to stand without UE assist, close supervision, remains stable;    Time 4    Period Weeks    Status Achieved    Target Date 07/23/21              PT Long Term Goals -      PT LONG TERM GOAL #1   Title Patient (> 95 years old) will complete five times sit to stand test in < 15 seconds indicating an increased LE strength and improved balance.    Baseline 9/21: 29 seconds hands-free 10/10: deferred 10/12: 34 sec hands free; 11/2: 23.8 sec hands-free; 12/7: 33.2 sec hands free, 1/3: 33.92 hands free; 06/25/21: 25.69 sec, 4/13: 15.5 sec hands free, 5/18: 23 sec without UE 6/22: deferred, 8/1: 15.7 sec without UE   Time 8    Period Weeks    Status Partially Met    Target Date  12/17/21     PT LONG TERM GOAL #2   Title Patient will increase six minute walk test distance to >400 feet for improve gait ability    Baseline 9/21: 368 ft with RW; 10/10: deferred 10/12: 408 ft; 11/2: 476 ft with 4WW, 1/3: 475 feet with RW; 4/13: 450 feet without AD CGA 10/15/21: 460 feet without AD, CGA   Time 8    Period Weeks    Status Achieved    Target Date 12/17/21     PT LONG TERM GOAL #3   Title Patient will increase 10 meter walk test to >1.69ms as to improve gait speed for better community ambulation and to reduce fall risk.    Baseline 9/21: 0.5 m/s with RW; 10/10 deferred  10/12: 0.93 m/s with 4KA; 11/2: 0.83 m/s with 7GO; 12/7: 0.52 m/s with RW, 1/3: 0.704; 06/25/21:  0.55 m/s with SPC. 5/18: 0.4 m/s without AD , 10/15/21 0.507 m/s without AD, 8/1: 0.64 m/s   Time 8    Period Weeks    Status Partially Met   Target Date 12/17/21     PT LONG TERM GOAL #4   Title Patient will be independent with ascend/descend 6 steps using single UE in step over step pattern without LOB.    Baseline 9/21:  Patient uses PT stairs (4 steps) with bilateral upper extremity support on handrails for both ascending/descending.  Patient exhibits reciprocal pattern ascending and step to pattern descending. 10/10: deferred; 10/12: ascending/descending recip. steps with BUE support; 11/2: Ascended/descended 8 steps total, reciprocal pattern ascending and step-to descending. Pt uses UUE support throughout with CGA assist.; 12/7: Ascended and descended 8 steps total with UUE support and CGA. Reciprocal stepping ascending and step to descending., 1/3: Deferrred; 06/25/21: able to perform with SUE support and reciprocal pattern asc/desc, but does require close CGA with descending. 5/18: independent with 2 handrails, close supervision with 1 handrai; 10/15/21: requires 2 handrails, 8/1: requires 1 handrail, descends one step at a time   Time 8    Period Weeks    Status Partially Met    Target Date 12/17/21     PT LONG TERM GOAL #5   Title Patient will demonstrate an improved Berg Balance Score of >45/56 as to demonstrate improved balance with ADLs such as sitting/standing and transfer balance and reduced fall risk.    Baseline 9/21: deferred d/t time; 9/29: 42/56; 10/10: deferred; 10/12: 44/56; 11/2: deferred; 11/7: 46/56, 1/3: deferred    Time 8    Period Weeks    Status Achieved    Target Date 12/17/21     PT LONG TERM GOAL #6   Title Patient will improve FOTO score to >60% to indicate improved functional mobility with ADLs.    Baseline 9/21: 59; 10/10: 57; 11/2: 60; 12/7: 58%, 1/3: 61%, 4/13: 56% 10/15/21: 58%, 8/1: 56%   Time 8    Period Weeks    Status Partially Met   Target Date  12/17/21     PT LONG TERM GOAL #7   Title Pt will improve FGA by a minimum of 5 points to indicate clinically significant improvement in reduced risk of falls with walking tasks.    Baseline 06/25/21: 12, 4/13: deferred, 5/18: deferred due to increased ankle discomfort; 6/22: deferred due to elevated BP- will assess next visit; 10/20/21: 8/30, 8/1: 8/30   Time 8    Period Weeks    Status Not met   Target Date 12/17/21             Plan -    Clinical Impression Statement Pt  deomnstrated excellent motivation for completion of physical therapy activities.  Patient worked on strategies for stepping over objects with straight point cane this session and with cues as well as practice patient demonstrated improved efficacy with this.  Patient blood pressure responding better to physical activity this session compared to previous sessions, likely due to medication change she reports doctor put her on recently.  Patient does still difficulty with right lower extremity foot clearance as well as with proper sequencing of cane is on several gait cycles patient does demonstrate not putting cane completely on floor with ambulation resulting in slight loss of balance which she was able to correct independently.Pt will continue to  benefit from skilled physical therapy intervention to address impairments, improve QOL, and attain therapy goals.      Personal Factors and Comorbidities Age    Comorbidities HTN, CKD, fall risk, type 2 diabetes,    Examination-Activity Limitations Lift;Locomotion Level;Squat;Stairs;Stand;Toileting;Transfers;Bathing    Examination-Participation Restrictions Cleaning;Community Activity;Laundry;Meal Prep;Shop;Volunteer;Driving    Stability/Clinical Decision Making Stable/Uncomplicated    Rehab Potential Fair    PT Frequency 2x / week    PT Duration 8 weeks    PT Treatment/Interventions Cryotherapy;Electrical Stimulation;Moist Heat;Gait training;Stair training;Functional mobility  training;Therapeutic activities;Therapeutic exercise;Neuromuscular re-education;Balance training;Patient/family education;Orthotic Fit/Training;Energy conservation    PT Next Visit Plan work on LE strengthening and balance; gait training, endurance, continue POC as previously indicated, updated HEP   PT Home Exercise Plan No changes this date    Consulted and Agree with Plan of Care Patient            Particia Lather PT  12/15/21, 2:58 PM Physical Therapist - [facility]

## 2021-12-17 ENCOUNTER — Ambulatory Visit: Payer: Medicare HMO | Admitting: Occupational Therapy

## 2021-12-17 ENCOUNTER — Ambulatory Visit: Payer: Medicare HMO

## 2021-12-17 DIAGNOSIS — M6281 Muscle weakness (generalized): Secondary | ICD-10-CM | POA: Diagnosis not present

## 2021-12-17 DIAGNOSIS — R262 Difficulty in walking, not elsewhere classified: Secondary | ICD-10-CM

## 2021-12-17 DIAGNOSIS — R2689 Other abnormalities of gait and mobility: Secondary | ICD-10-CM

## 2021-12-17 DIAGNOSIS — R2681 Unsteadiness on feet: Secondary | ICD-10-CM

## 2021-12-17 DIAGNOSIS — R278 Other lack of coordination: Secondary | ICD-10-CM

## 2021-12-17 DIAGNOSIS — R482 Apraxia: Secondary | ICD-10-CM

## 2021-12-17 DIAGNOSIS — R269 Unspecified abnormalities of gait and mobility: Secondary | ICD-10-CM

## 2021-12-17 DIAGNOSIS — R4701 Aphasia: Secondary | ICD-10-CM

## 2021-12-17 DIAGNOSIS — I639 Cerebral infarction, unspecified: Secondary | ICD-10-CM

## 2021-12-17 NOTE — Therapy (Signed)
OUTPATIENT OCCUPATIONAL THERAPY TREATMENT NOTE   Patient Name: Cassandra Schaefer MRN: [id number] DOB:[birth year], 88 y.o., female Today's Date: 12/17/2021   REFERRING PROVIDER: Tracie Harrier, MD   OT End of Session - 12/17/21 1314     Visit Number 95    Number of Visits 114    Date for OT Re-Evaluation 12/28/21    Authorization Time Period Reporting period starting 09/03/2021    OT Start Time 1302    OT Stop Time 1345    OT Time Calculation (min) 43 min    Activity Tolerance Patient tolerated treatment well    Behavior During Therapy WFL for tasks assessed/performed                  Past Medical History:  Diagnosis Date   Anemia    Cough    RESOLVING, NO FEVER   Diabetes mellitus without complication ([place])    Gout    Hypertension    Hypothyroidism    PUD (peptic ulcer disease)    Stroke [facility])    Wears dentures    full upper, partial lower   Past Surgical History:  Procedure Laterality Date   AMPUTATION TOE Left 12/14/2017   Procedure: AMPUTATION TOE-4TH MPJ;  Surgeon: Samara Deist, DPM;  Location: [place];  Service: Podiatry;  Laterality: Left;  IVA LOCAL Diabetic - oral meds   APPENDECTOMY     CATARACT EXTRACTION W/PHACO Right 06/23/2015   Procedure: CATARACT EXTRACTION PHACO AND INTRAOCULAR LENS PLACEMENT (IOC);  Surgeon: Estill Cotta, MD;  Location: [facility];  Service: Ophthalmology;  Laterality: Right;  [place] 01:28 AP% 23.4 CDE 36.14 fluid pack lot # [id number]   CATARACT EXTRACTION W/PHACO Left 07/28/2015   Procedure: CATARACT EXTRACTION PHACO AND INTRAOCULAR LENS PLACEMENT (IOC);  Surgeon: Estill Cotta, MD;  Location: [facility];  Service: Ophthalmology;  Laterality: Left;  [place]    1:29.7 AP%  24.9 CDE   41.32 fluid casette lot #[id number] H exp 09/23/2016   COONOSCOPY     AND ENDOSCOPY   DILATION AND CURETTAGE OF UTERUS     ENDARTERECTOMY Left 11/13/2020   Procedure: Evacuation of left neck hematoma;  Surgeon: Katha Cabal, MD;   Location: [facility];  Service: Vascular;  Laterality: Left;   ENDARTERECTOMY Left 11/13/2020   Procedure: ENDARTERECTOMY CAROTID;  Surgeon: Algernon Huxley, MD;  Location: [facility];  Service: Vascular;  Laterality: Left;   TONSILLECTOMY     TUBAL LIGATION     Patient Active Problem List   Diagnosis Date Noted   Carotid stenosis, symptomatic, with infarction ([place]) 11/13/2020   Gout flare 11/10/2020   UTI (urinary tract infection) 11/10/2020   Left carotid artery stenosis 11/10/2020   Chronic kidney disease 11/10/2020   Left basal ganglia embolic stroke ([place]) 56/21/3086   PAC (premature atrial contraction)    Pre-procedural cardiovascular examination    Ischemic stroke ([place]) 10/20/2020   TIA (transient ischemic attack) 10/19/2020   Right hemiparesis ([place]) 10/19/2020   Type 2 diabetes mellitus with stage 3 chronic kidney disease, without long-term current use of insulin ([place]) 08/24/2018   Hyperlipidemia, unspecified 07/19/2017   Hypertension 07/19/2017   PUD (peptic ulcer disease) 07/19/2017   Type 2 diabetes mellitus ([place]) 07/19/2017   Personal history of gout 08/12/2016   Anemia, unspecified 04/09/2016   Hypothyroidism, acquired 12/10/2015   REFERRING DIAG: CVA  THERAPY DIAG:  Muscle weakness (generalized)  Other lack of coordination  Rationale for Evaluation and Treatment Rehabilitation  PERTINENT HISTORY:   R ankle pain,  gout, T2DM, HTN, CKDIII; R/L carotid endarectomy  PRECAUTIONS: None  SUBJECTIVE: Pt. Reports doing well today  PAIN:  Are you having pain? No  OBJECTIVE:   TODAY'S TREATMENT:   Pt. worked on right hand [facility] skills grasping Boggle cubes, and storing them in the palm of her right hand.  Pt. Worked on grasping the Boggle cubes with her 2nd digit, and thumb. Pt. Worked on storing the cubes in the palm of her hand, and translatory movements moving them from her palm to the tip of her 2nd digit, and thumb in preparation for placing them at the tabletop  surface formulating words. Pt. On writing words, and creating sentences with the word lists.  Pt. Was further challenged with dual tasking completing the task while while conversating.  Pt. was able to grasp, and turn the cubes in the center of the [place] board, as well as some from the edge without difficulty today. Pt. Requires increased time to complete. Pt. occasionally dropped multiple cubes when performing translatory movements.  Pt. Was able to turn the cubes to find the desired letters, however stabilized the letters at the tabletop occasionally while turning them with the tips of her fingers.  Pt. Was able to formulate 3 and 5 letter words at the tabletop. Pt. Wrote the words and sentences using a standard pen with a small adaptive foam cylinder with 75% legibility for printing, and  50% for cursive writing. Pt. Was able to complete the task when further challenged with dual tasking. Pt. continues to work on improving RUE [facility], and hand functioning in order to work towards improving, and maximizing independence with ADLs, and IADL functioning.      OT Short Term Goals - 03/23/21 1657       OT SHORT TERM GOAL #1   Title Pt will perform HEP for RUE strength/coordination independently.    Baseline Eval: HEP not yet established in outpatient, but pt is working with putty from [name]; 12/04/2020; [facility] HEP initiated this day with mod vc after return demo; 01/05/2021: indep with currently program, but ongoing as pt progresses; 01/28/2021: vc to revisit and focus on grip and pinch strengthening with putty; 03/23/21: pt is indep with HEP    Time 6    Period Weeks    Status Achieved    Target Date 03/23/21              OT Long Term Goals - 10/15/21 1322       OT LONG TERM GOAL #1   Title Pt will improve hand writing to 100% legibility with R hand to be able to independently write a check.    Baseline Eval: signature is 75% legible with R hand, requiring extra time; 12/04/2020: no chan to complete.ge  from eval; 01/05/2021: Printing is 100% legible, signature is 90% legible, but still requires extra time and effort.; 01/26/21: Signature is 90% legible and speed/fluidity have improved. 20th visit: 90% legible, 03/23/21: 90% legible, extra time 40th visit 90 % legibility with increased time, 50th visit: 90% legible with with increased time required 07/14/2021: 90% legibility. 07/30/2021: 90% legibility, pt. presses too hard through the pen, and pencil. 09/03/2021: 90% legibility; 10/06/21: Increased time, but pt states she signs cards and writes short notes on a card sufficiently.  Pt states she has no need to write checks anymore as her family assists with this and she has no desire to return to this, but she would like to work on her pressure modulation.  New goal  below. 10/15/2021: Pt. conitnues to require increased time to complete note cards, and corespondence.    Time 12    Period Weeks    Status Partially Met    Target Date 12/28/21      OT LONG TERM GOAL #2   Title Pt will improve R hand coordination to enable good manipulation of iphone using R dominant hand      OT LONG TERM GOAL #3   Title Pt will improve GMC throughout RUE to enable pt to reach for and pick up ADL supplies with R dominant hand without dropping or knocking over objects.    Baseline Eval: Pt reports RUE is clumsy and easily knocks over objects when trying to reach for ADL supplies.  Pt verbalizes that her "aim is off."; 12/04/2020: pt reports slight improvement since eval and has started using her R hand to eat, but still feels clumsy; 01/05/2021: Greatly improved; pt is using silverware to eat with R hand, can comb hair with RUE, still mildly ataxic; 01/26/21: pt reports difficulty picking up objects within a small space (ie; may knock the toothpaste holder down when reaching for toothbrush), but reaching for light/larger objects is improving 20th visit: pt. continues to be mildly ataxic, however is consistently improving with motor  control, and [place] while reaching targets; 03/23/21: Pt reaches with accuracy towards targets if she takes her time (pt drops and knocks things over when moving at a faster/normal pace) 04/23/2021: Pt. continues to present with difficulty with depth perception, and impaired [place].40th visit: Pt. is improving, however tends to knock over lighter weight objects.50th visit: Pt. has improved with reaching for items without knocking them over, however continues to need work on accuracy.07/14/2021: Pt. is improving while motor control, and accuracy of reaching for objects. Pt. continues to work towards improving efficiency with reaching for objects. 07/30/2021: Pt. is able to able to reach for objects more accurately, however continues to present with limited motor control in the Right. 09/03/2021: Pt. Pt. is improving with reaching up without knocking items over. pt. is now able to stand at the sink and reach up without dropping items; 10/06/21: pt estimates she reaches with 85-90% accurate with RUE, occasional dropping. 10/15/2021: Pt. reports 85-90% accureacy, however presents with decreased accuracy the higher, or lower she reaches.    Time 12    Period Weeks    Status On-going    Target Date 12/28/21      OT LONG TERM GOAL #4   Title Pt will improve FOTO score to 68 or better to indicate measurable functional improvement.    Baseline Eval: FOTO 55; 01/05/2021: FOTO 61; 01/28/21: FOTO 61, 20th visit: 61; 03/23/21: FOTO 60 4oth visit: FOTO score: 61, 50th visit: FOTO 69 07/30/2021: FOTO 71  09/03/2021: FOTO 73; 10/06/21: 73 10/15/2021: 77    Time 12    Period Weeks    Status On-going      OT LONG TERM GOAL #6   Title Pt. will improve right hand [facility] skills to be able to indepedendently manipulate and set-up her medication/pills in the pillbox.    Baseline 07/14/2021: Pt. has difficulty manipulating small pills, and often drops them. 07/30/2021: Pt. continues to have difficulty grasping pills/medication for set-up of  pillbox. 09/03/2021: Pt. is able to grasp pills from the pillbox without dropping them; 10/06/21: modified indep (with extra time to complete). 10/15/2021: Pt. reports requiring increased time to complete.    Time 12    Period Weeks    Status  Partially Met    Target Date 12/28/21      OT LONG TERM GOAL #7   Title Pt will improve accuracy with graded force through RUE during functional tasks per self report.    Baseline 10/06/21: Pt reports she holds her greatgrandchild too tightly, scratches her arm too forcefully, and sometimes writes with too much pressure; pt struggles to grade force for functional activities.10/15/2021: Pt. continues to grasp items with the left hand too tightly.    Time 12    Period Weeks    Status Achieved              Plan - 10/22/21 1311     Clinical Impression Statement Pt. was able to grasp, and turn the cubes in the center of the [place] board, as well as some from the edge without difficulty today. Pt. Requires increased time to complete. Pt. occasionally dropped multiple cubes when performing translatory movements.  Pt. Was able to turn the cubes to find the desired letters, however stabilized the letters at the tabletop occasionally while turning them with the tips of her fingers.  Pt. Was able to formulate 3 and 5 letter words at the tabletop. Pt. Wrote the words and sentences using a standard pen with a small adaptive foam cylinder with 75% legibility for printing, and  50% for cursive writing. Pt. Was able to complete the task when further challenged with dual tasking. Pt. continues to work on improving RUE [facility], and hand functioning in order to work towards improving, and maximizing independence with ADLs, and IADL functioning.             OT Occupational Profile and History Detailed Assessment- Review of Records and additional review of physical, cognitive, psychosocial history related to current functional performance    Occupational performance deficits  (Please refer to evaluation for details): ADL's;Leisure;IADL's    Body Structure / Function / Physical Skills ADL;Coordination;Endurance;GMC;UE functional use;Balance;Sensation;Body mechanics;IADL;Pain;Dexterity;FMC;Strength;Gait;Mobility    Rehab Potential Good    Clinical Decision Making Several treatment options, min-mod task modification necessary    Comorbidities Affecting Occupational Performance: May have comorbidities impacting occupational performance    Modification or Assistance to Complete Evaluation  Min-Moderate modification of tasks or assist with assess necessary to complete eval    OT Frequency 2x / week    OT Duration 12 weeks    OT Treatment/Interventions Self-care/ADL training;Therapeutic exercise;DME and/or AE instruction;Functional Mobility Training;Balance training;Neuromuscular education;Therapeutic activities;Patient/family education    Family Member Consulted             Harrel Carina, MS, OTR/L  Harrel Carina, [place] 12/17/2021, 1:17 PM

## 2021-12-17 NOTE — Therapy (Signed)
OUTPATIENT PHYSICAL THERAPY TREATMENT NOTE    Patient Name: Cassandra Schaefer MRN: [id number] DOB:17-Dec-1928, 88 y.o., female Today's Date: 12/17/2021  PCP: Roetta Sessions MD REFERRING PROVIDER: Roetta Sessions MD   PT End of Session - 12/17/21 1435     Visit Number 73    Number of Visits 102    Date for PT Re-Evaluation 12/17/21    Authorization Type [employer]    Authorization Time Period 10/22/21-12/17/21    Progress Note Due on Visit 100    PT Start Time 1345    PT Stop Time 1428    PT Time Calculation (min) 43 min    Equipment Utilized During Treatment Gait belt    Activity Tolerance Patient tolerated treatment well;No increased pain    Behavior During Therapy WFL for tasks assessed/performed                 Past Medical History:  Diagnosis Date   Anemia    Cough    RESOLVING, NO FEVER   Diabetes mellitus without complication ([place])    Gout    Hypertension    Hypothyroidism    PUD (peptic ulcer disease)    Stroke [facility])    Wears dentures    full upper, partial lower   Past Surgical History:  Procedure Laterality Date   AMPUTATION TOE Left 12/14/2017   Procedure: AMPUTATION TOE-4TH MPJ;  Surgeon: Samara Deist, DPM;  Location: [place];  Service: Podiatry;  Laterality: Left;  IVA LOCAL Diabetic - oral meds   APPENDECTOMY     CATARACT EXTRACTION W/PHACO Right 06/23/2015   Procedure: CATARACT EXTRACTION PHACO AND INTRAOCULAR LENS PLACEMENT (IOC);  Surgeon: Estill Cotta, MD;  Location: [facility];  Service: Ophthalmology;  Laterality: Right;  [place] 01:28 AP% 23.4 CDE 36.14 fluid pack lot # [id number]   CATARACT EXTRACTION W/PHACO Left 07/28/2015   Procedure: CATARACT EXTRACTION PHACO AND INTRAOCULAR LENS PLACEMENT (IOC);  Surgeon: Estill Cotta, MD;  Location: [facility];  Service: Ophthalmology;  Laterality: Left;  [place]    1:29.7 AP%  24.9 CDE   41.32 fluid casette lot #[id number] H exp 09/23/2016   COONOSCOPY     AND ENDOSCOPY   DILATION AND CURETTAGE OF  UTERUS     ENDARTERECTOMY Left 11/13/2020   Procedure: Evacuation of left neck hematoma;  Surgeon: Katha Cabal, MD;  Location: [facility];  Service: Vascular;  Laterality: Left;   ENDARTERECTOMY Left 11/13/2020   Procedure: ENDARTERECTOMY CAROTID;  Surgeon: Algernon Huxley, MD;  Location: [facility];  Service: Vascular;  Laterality: Left;   TONSILLECTOMY     TUBAL LIGATION     Patient Active Problem List   Diagnosis Date Noted   Carotid stenosis, symptomatic, with infarction ([place]) 11/13/2020   Gout flare 11/10/2020   UTI (urinary tract infection) 11/10/2020   Left carotid artery stenosis 11/10/2020   Chronic kidney disease 11/10/2020   Left basal ganglia embolic stroke ([place]) 78/46/9629   PAC (premature atrial contraction)    Pre-procedural cardiovascular examination    Ischemic stroke ([place]) 10/20/2020   TIA (transient ischemic attack) 10/19/2020   Right hemiparesis ([place]) 10/19/2020   Type 2 diabetes mellitus with stage 3 chronic kidney disease, without long-term current use of insulin ([place]) 08/24/2018   Hyperlipidemia, unspecified 07/19/2017   Hypertension 07/19/2017   PUD (peptic ulcer disease) 07/19/2017   Type 2 diabetes mellitus ([place]) 07/19/2017   Personal history of gout 08/12/2016   Anemia, unspecified 04/09/2016   Hypothyroidism, acquired 12/10/2015    REFERRING DIAG:  CVA with right hemiparesis  THERAPY DIAG:  Muscle weakness (generalized)  Other lack of coordination  Difficulty in walking, not elsewhere classified  Unsteadiness on feet  Abnormality of gait and mobility  Apraxia  Aphasia  Left basal ganglia embolic stroke ([facility])  Other abnormalities of gait and mobility  Rationale for Evaluation and Treatment Rehabilitation  PERTINENT HISTORY: 88 y.o. female with medical history significant for type 2 diabetes mellitus, essential hypertension, acquired hypothyroidism, hyperlipidemia, stage III chronic kidney disease reports increased right sided weakness on  10/19/20. She was diagnosed with left basal ganglia ischemic stroke. She was discharged to inpatient rehab for 10 days and then discharged home. Patient was evaluated by outpatient PT on 11/12/20. CVA was due to carotid stenosis. She underwent left carotid endartectomy on 7/21. Procedure went well however patient suffered from left cervical hematoma and had subsequent surgical procedure to stop the bleed and remove the hematoma on 11/13/20. They were concerned with her breathing and therefore intubated her for 1 day, she was in ICU for 2 days and transferred to med/surge on 11/15/20. Patient was discharged home on 11/17/20 and is now returning to outpatient PT. She has had the stiches removed and is healing well. Denies any neck pain or stiffness. She lives with her daughter and has 24/7 caregiver support. She is still using RW for most ambulation. She is able to transfer to bedside commode well and is able to stand short time unsupported. She reports feeling very weak and fatigued. She reports she has been dragging her right foot more. She denies any numbness/tingling; She reports feeling like her legs are wanting to do more but is hesitant because of fear of falling. She is mod I for self care morning routine. She does still receive help with showering. She has been working on increasing her activity but denies any formal exercise.   PRECAUTIONS: Fall risk  SUBJECTIVE: Pt reports no pain or falls. No soreness or pain after last session. Has been doing a lot of HEP interventions in the pool which has been going well.   PAIN:  Are you having pain? No   TODAY'S TREATMENT: 12/17/2021  Vitals:   BP: 156/41 mmHg  HR:  62 BPM     Neuro Re-ED:   Amb 164' x2 with SPC in LUE. X2 posterior LOB during gait noted with larger step lengths and poor SPC placement. 1st bout of gait with AAROM for RUE reciprocal swing. 2nd bout of gait with focus on internal feedback for deficits, requiring ~50% cueing for correction  of R foot drag and RUE reciprocal swing.  Amb over 1/2 foam roll and block wedges with emphasis on bil foot clearance and balance. X2 LOB leading with L foot due to poor motor grading and planning with stepping.   Seated D1 flexion RLE PNF x10, resisted progression for strengthening and improved R foot clearance with gait  STS with airex pad on chair, adductor squeeze, UE support on thighs 2x8 with CGA, emphasis on anterior weight shift and glute activation for power to stand    Vitals post session:  BP: 161/41 mmHg HR: 71 bpm      PATIENT EDUCATION: Education details: Exercise technique/positioning; HEP Person educated: Patient Education method: Consulting civil engineer, Demonstration, and Verbal cues Education comprehension: verbalized understanding, returned demonstration, and needs further education   HOME EXERCISE PROGRAM: No changes this session;   Instructed patient in pool exercise: Access Code: [email or web address] URL: [email or web address] Date: 10/22/2021 Prepared by: Blanche East  Exercises - Seated Knee  Extension with Resistance  - 1 x daily - 7 x weekly - 2 sets - 15 reps - Forward and Backward Stepping at [employer]  - 1 x daily - 7 x weekly - 1 sets - 5 reps - Freestyle Swimming with Snorkel Mask  - 1 x daily - 7 x weekly - 1 sets - 2-3 reps - Heel Walking  - 1 x daily - 7 x weekly - 1 sets - 5 reps - Toe Walking  - 1 x daily - 7 x weekly - 1 sets - 5 reps - Single Leg Stance at Pool Wall  - 1 x daily - 7 x weekly - 1 sets - 3-5 reps - 15-30 sec hold   PT Short Term Goals -       PT SHORT TERM GOAL #1   Title Patient will be adherent to HEP at least 3x a week to improve functional strength and balance for better safety at home.    Baseline 9/21: Pt reports compliance with HEP and is confident, 1/3: doing exercise 2x a week; 06/25/21: 3x/week, 5/18: consistent; 8/1: consistent;    Time 4    Period Weeks    Status Achieved    Target Date 06/25/21      PT SHORT  TERM GOAL #2   Title Patient will be independent in transferring sit<>Stand without pushing on arm rests to improve ability to get up from chair.    Baseline 9/21: pt able to complete STS without use of UEs, 1/3:able to stand but has moderate difficulty requiring increased time; 06/25/21: able to perform indep without hands but requiring use of momentum to attain standing. Remains stable. 5/18: able to stand without UE assist, close supervision, remains stable;    Time 4    Period Weeks    Status Achieved    Target Date 07/23/21              PT Long Term Goals -      PT LONG TERM GOAL #1   Title Patient (> 30 years old) will complete five times sit to stand test in < 15 seconds indicating an increased LE strength and improved balance.    Baseline 9/21: 29 seconds hands-free 10/10: deferred 10/12: 34 sec hands free; 11/2: 23.8 sec hands-free; 12/7: 33.2 sec hands free, 1/3: 33.92 hands free; 06/25/21: 25.69 sec, 4/13: 15.5 sec hands free, 5/18: 23 sec without UE 6/22: deferred, 8/1: 15.7 sec without UE   Time 8    Period Weeks    Status Partially Met    Target Date  12/17/21     PT LONG TERM GOAL #2   Title Patient will increase six minute walk test distance to >400 feet for improve gait ability    Baseline 9/21: 368 ft with RW; 10/10: deferred 10/12: 408 ft; 11/2: 476 ft with 4WW, 1/3: 475 feet with RW; 4/13: 450 feet without AD CGA 10/15/21: 460 feet without AD, CGA   Time 8    Period Weeks    Status Achieved    Target Date 12/17/21     PT LONG TERM GOAL #3   Title Patient will increase 10 meter walk test to >1.18ms as to improve gait speed for better community ambulation and to reduce fall risk.    Baseline 9/21: 0.5 m/s with RW; 10/10 deferred 10/12: 0.93 m/s with 48SH 11/2: 0.83 m/s with 46OH 12/7: 0.52 m/s with RW, 1/3: 0.704; 06/25/21: 0.55 m/s with SPC. 5/18: 0.4 m/s  without AD , 10/15/21 0.507 m/s without AD, 8/1: 0.64 m/s   Time 8    Period Weeks    Status Partially Met    Target Date 12/17/21     PT LONG TERM GOAL #4   Title Patient will be independent with ascend/descend 6 steps using single UE in step over step pattern without LOB.    Baseline 9/21:  Patient uses PT stairs (4 steps) with bilateral upper extremity support on handrails for both ascending/descending.  Patient exhibits reciprocal pattern ascending and step to pattern descending. 10/10: deferred; 10/12: ascending/descending recip. steps with BUE support; 11/2: Ascended/descended 8 steps total, reciprocal pattern ascending and step-to descending. Pt uses UUE support throughout with CGA assist.; 12/7: Ascended and descended 8 steps total with UUE support and CGA. Reciprocal stepping ascending and step to descending., 1/3: Deferrred; 06/25/21: able to perform with SUE support and reciprocal pattern asc/desc, but does require close CGA with descending. 5/18: independent with 2 handrails, close supervision with 1 handrai; 10/15/21: requires 2 handrails, 8/1: requires 1 handrail, descends one step at a time   Time 8    Period Weeks    Status Partially Met    Target Date 12/17/21     PT LONG TERM GOAL #5   Title Patient will demonstrate an improved Berg Balance Score of >45/56 as to demonstrate improved balance with ADLs such as sitting/standing and transfer balance and reduced fall risk.    Baseline 9/21: deferred d/t time; 9/29: 42/56; 10/10: deferred; 10/12: 44/56; 11/2: deferred; 11/7: 46/56, 1/3: deferred    Time 8    Period Weeks    Status Achieved    Target Date 12/17/21     PT LONG TERM GOAL #6   Title Patient will improve FOTO score to >60% to indicate improved functional mobility with ADLs.    Baseline 9/21: 59; 10/10: 57; 11/2: 60; 12/7: 58%, 1/3: 61%, 4/13: 56% 10/15/21: 58%, 8/1: 56%   Time 8    Period Weeks    Status Partially Met   Target Date 12/17/21     PT LONG TERM GOAL #7   Title Pt will improve FGA by a minimum of 5 points to indicate clinically significant improvement in reduced  risk of falls with walking tasks.    Baseline 06/25/21: 12, 4/13: deferred, 5/18: deferred due to increased ankle discomfort; 6/22: deferred due to elevated BP- will assess next visit; 10/20/21: 8/30, 8/1: 8/30   Time 8    Period Weeks    Status Not met   Target Date 12/17/21             Plan -    Clinical Impression Statement Pt tolerated treatment well today. Interventions continue to focus on balance and gait for reduced risk of falls. Improved right foot clearance with gait noted after D2 LE PNF as well as obstacle course, however, benefits short lived secondary to fatigue. Educated pt regarding internal vs. External feedback for gait deficits. Decreased activity tolerance noted by RPE of 4-6/10 indicating "moderate activity" with interventions. Intermittent rest breaks with assessment of BP; in therapeutic range today. Pt will continue to benefit from skilled physical therapy intervention to address impairments, improve QOL, and attain therapy goals.      Personal Factors and Comorbidities Age    Comorbidities HTN, CKD, fall risk, type 2 diabetes,    Examination-Activity Limitations Lift;Locomotion Level;Squat;Stairs;Stand;Toileting;Transfers;Bathing    Examination-Participation Restrictions Cleaning;Community Activity;Laundry;Meal Prep;Shop;Volunteer;Driving    Stability/Clinical Decision Making Stable/Uncomplicated    Rehab Potential  Fair    PT Frequency 2x / week    PT Duration 8 weeks    PT Treatment/Interventions Cryotherapy;Electrical Stimulation;Moist Heat;Gait training;Stair training;Functional mobility training;Therapeutic activities;Therapeutic exercise;Neuromuscular re-education;Balance training;Patient/family education;Orthotic Fit/Training;Energy conservation    PT Next Visit Plan Treadmill for continued cadence and improved fluidity of gait? Would need to closely monitor BP, standing D2 PNF, gait with head turns   PT Home Exercise Plan No changes this date    Consulted  and Agree with Plan of Care Patient            Herminio Commons PT  12/17/21, 3:44 PM Physical Therapist - [facility]

## 2021-12-22 ENCOUNTER — Encounter: Payer: Self-pay | Admitting: Occupational Therapy

## 2021-12-22 ENCOUNTER — Ambulatory Visit: Payer: Medicare HMO | Admitting: Occupational Therapy

## 2021-12-22 ENCOUNTER — Ambulatory Visit: Payer: Medicare HMO

## 2021-12-22 DIAGNOSIS — R278 Other lack of coordination: Secondary | ICD-10-CM

## 2021-12-22 DIAGNOSIS — R269 Unspecified abnormalities of gait and mobility: Secondary | ICD-10-CM

## 2021-12-22 DIAGNOSIS — M6281 Muscle weakness (generalized): Secondary | ICD-10-CM

## 2021-12-22 DIAGNOSIS — R2689 Other abnormalities of gait and mobility: Secondary | ICD-10-CM

## 2021-12-22 DIAGNOSIS — R471 Dysarthria and anarthria: Secondary | ICD-10-CM

## 2021-12-22 DIAGNOSIS — R482 Apraxia: Secondary | ICD-10-CM

## 2021-12-22 DIAGNOSIS — R2681 Unsteadiness on feet: Secondary | ICD-10-CM

## 2021-12-22 DIAGNOSIS — I639 Cerebral infarction, unspecified: Secondary | ICD-10-CM

## 2021-12-22 DIAGNOSIS — R262 Difficulty in walking, not elsewhere classified: Secondary | ICD-10-CM

## 2021-12-22 NOTE — Therapy (Addendum)
OUTPATIENT PHYSICAL THERAPY TREATMENT NOTE    Patient Name: Cassandra Schaefer MRN: [id number] DOB:Feb 14, 1929, 88 y.o., female Today's Date: 12/22/2021  PCP: Roetta Sessions MD REFERRING PROVIDER: Roetta Sessions MD   PT End of Session - 12/22/21 1352     Visit Number 98    Number of Visits 102    Date for PT Re-Evaluation 02/16/22    Authorization Type [employer]    Authorization Time Period 12/22/21-02/16/22    Progress Note Due on Visit 100    PT Start Time 1350    PT Stop Time 1430    PT Time Calculation (min) 40 min    Equipment Utilized During Treatment Gait belt    Activity Tolerance Patient tolerated treatment well;No increased pain    Behavior During Therapy WFL for tasks assessed/performed                 Past Medical History:  Diagnosis Date   Anemia    Cough    RESOLVING, NO FEVER   Diabetes mellitus without complication ([place])    Gout    Hypertension    Hypothyroidism    PUD (peptic ulcer disease)    Stroke [facility])    Wears dentures    full upper, partial lower   Past Surgical History:  Procedure Laterality Date   AMPUTATION TOE Left 12/14/2017   Procedure: AMPUTATION TOE-4TH MPJ;  Surgeon: Samara Deist, DPM;  Location: [place];  Service: Podiatry;  Laterality: Left;  IVA LOCAL Diabetic - oral meds   APPENDECTOMY     CATARACT EXTRACTION W/PHACO Right 06/23/2015   Procedure: CATARACT EXTRACTION PHACO AND INTRAOCULAR LENS PLACEMENT (IOC);  Surgeon: Estill Cotta, MD;  Location: [facility];  Service: Ophthalmology;  Laterality: Right;  [place] 01:28 AP% 23.4 CDE 36.14 fluid pack lot # [id number]   CATARACT EXTRACTION W/PHACO Left 07/28/2015   Procedure: CATARACT EXTRACTION PHACO AND INTRAOCULAR LENS PLACEMENT (IOC);  Surgeon: Estill Cotta, MD;  Location: [facility];  Service: Ophthalmology;  Laterality: Left;  [place]    1:29.7 AP%  24.9 CDE   41.32 fluid casette lot #[id number] H exp 09/23/2016   COONOSCOPY     AND ENDOSCOPY   DILATION AND CURETTAGE OF  UTERUS     ENDARTERECTOMY Left 11/13/2020   Procedure: Evacuation of left neck hematoma;  Surgeon: Katha Cabal, MD;  Location: [facility];  Service: Vascular;  Laterality: Left;   ENDARTERECTOMY Left 11/13/2020   Procedure: ENDARTERECTOMY CAROTID;  Surgeon: Algernon Huxley, MD;  Location: [facility];  Service: Vascular;  Laterality: Left;   TONSILLECTOMY     TUBAL LIGATION     Patient Active Problem List   Diagnosis Date Noted   Carotid stenosis, symptomatic, with infarction ([place]) 11/13/2020   Gout flare 11/10/2020   UTI (urinary tract infection) 11/10/2020   Left carotid artery stenosis 11/10/2020   Chronic kidney disease 11/10/2020   Left basal ganglia embolic stroke ([place]) 56/21/3086   PAC (premature atrial contraction)    Pre-procedural cardiovascular examination    Ischemic stroke ([place]) 10/20/2020   TIA (transient ischemic attack) 10/19/2020   Right hemiparesis ([place]) 10/19/2020   Type 2 diabetes mellitus with stage 3 chronic kidney disease, without long-term current use of insulin ([place]) 08/24/2018   Hyperlipidemia, unspecified 07/19/2017   Hypertension 07/19/2017   PUD (peptic ulcer disease) 07/19/2017   Type 2 diabetes mellitus ([place]) 07/19/2017   Personal history of gout 08/12/2016   Anemia, unspecified 04/09/2016   Hypothyroidism, acquired 12/10/2015    REFERRING DIAG:  CVA with right hemiparesis  THERAPY DIAG:  Muscle weakness (generalized)  Other lack of coordination  Difficulty in walking, not elsewhere classified  Unsteadiness on feet  Abnormality of gait and mobility  Apraxia  Left basal ganglia embolic stroke ([facility])  Other abnormalities of gait and mobility  Dysarthria and anarthria  Rationale for Evaluation and Treatment Rehabilitation  PERTINENT HISTORY: 88 y.o. female with medical history significant for type 2 diabetes mellitus, essential hypertension, acquired hypothyroidism, hyperlipidemia, stage III chronic kidney disease reports increased right  sided weakness on 10/19/20. She was diagnosed with left basal ganglia ischemic stroke. She was discharged to inpatient rehab for 10 days and then discharged home. Patient was evaluated by outpatient PT on 11/12/20. CVA was due to carotid stenosis. She underwent left carotid endartectomy on 7/21. Procedure went well however patient suffered from left cervical hematoma and had subsequent surgical procedure to stop the bleed and remove the hematoma on 11/13/20. They were concerned with her breathing and therefore intubated her for 1 day, she was in ICU for 2 days and transferred to med/surge on 11/15/20. Patient was discharged home on 11/17/20 and is now returning to outpatient PT. She has had the stiches removed and is healing well. Denies any neck pain or stiffness. She lives with her daughter and has 24/7 caregiver support. She is still using RW for most ambulation. She is able to transfer to bedside commode well and is able to stand short time unsupported. She reports feeling very weak and fatigued. She reports she has been dragging her right foot more. She denies any numbness/tingling; She reports feeling like her legs are wanting to do more but is hesitant because of fear of falling. She is mod I for self care morning routine. She does still receive help with showering. She has been working on increasing her activity but denies any formal exercise.   PRECAUTIONS: Fall risk  SUBJECTIVE: Pt doing well today, no updates since prior session. Recently got in pool for AMB side stepping training with family.   PAIN:  Are you having pain? No   TODAY'S TREATMENT: 12/22/2021  -21f HIT AMB with RW and 2lb AW bilat (8x c 60sec seated rest) average speed 0.97m  -seated hip flexion over obstacle and back x40  -seated eyes open verbcla cues hallux target tapping on floor for accuracy improvement x2 minuntes -seated proprioceptive toe tapping exercise with visual target gaze + eyes closed toe tap, eyes reopen for  accuracy assessment  2x4 minutes        PATIENT EDUCATION: Education details: Exercise technique/positioning; HEP Person educated: Patient Education method: ExConsulting civil engineerDemonstration, and Verbal cues Education comprehension: verbalized understanding, returned demonstration, and needs further education   HOME EXERCISE PROGRAM: No changes this session;   Instructed patient in pool exercise: Access Code: [email or web address] URL: [email or web address] Date: 10/22/2021 Prepared by: MaBlanche EastExercises - Seated Knee Extension with Resistance  - 1 x daily - 7 x weekly - 2 sets - 15 reps - Forward and Backward Stepping at [facility]- 1 x daily - 7 x weekly - 1 sets - 5 reps - Freestyle Swimming with Snorkel Mask  - 1 x daily - 7 x weekly - 1 sets - 2-3 reps - Heel Walking  - 1 x daily - 7 x weekly - 1 sets - 5 reps - Toe Walking  - 1 x daily - 7 x weekly - 1 sets - 5 reps - Single Leg Stance at [facility]-  1 x daily - 7 x weekly - 1 sets - 3-5 reps - 15-30 sec hold   PT Short Term Goals -       PT SHORT TERM GOAL #1   Title Patient will be adherent to HEP at least 3x a week to improve functional strength and balance for better safety at home.    Baseline 9/21: Pt reports compliance with HEP and is confident, 1/3: doing exercise 2x a week; 06/25/21: 3x/week, 5/18: consistent; 8/1: consistent;    Time 4    Period Weeks    Status Achieved    Target Date 06/25/21      PT SHORT TERM GOAL #2   Title Patient will be independent in transferring sit<>Stand without pushing on arm rests to improve ability to get up from chair.    Baseline 9/21: pt able to complete STS without use of UEs, 1/3:able to stand but has moderate difficulty requiring increased time; 06/25/21: able to perform indep without hands but requiring use of momentum to attain standing. Remains stable. 5/18: able to stand without UE assist, close supervision, remains stable;    Time 4    Period Weeks    Status  Achieved    Target Date 07/23/21              PT Long Term Goals -      PT LONG TERM GOAL #1   Title Patient (> 24 years old) will complete five times sit to stand test in < 15 seconds indicating an increased LE strength and improved balance.    Baseline 9/21: 29 seconds hands-free 10/10: deferred 10/12: 34 sec hands free; 11/2: 23.8 sec hands-free; 12/7: 33.2 sec hands free, 1/3: 33.92 hands free; 06/25/21: 25.69 sec, 4/13: 15.5 sec hands free, 5/18: 23 sec without UE 6/22: deferred, 8/1: 15.7 sec without UE   Time 8    Period Weeks    Status On going   Target Date  02/16/22     PT LONG TERM GOAL #2   Title Patient will increase six minute walk test distance to >400 feet for improve gait ability    Baseline 9/21: 368 ft with RW; 10/10: deferred 10/12: 408 ft; 11/2: 476 ft with 4WW, 1/3: 475 feet with RW; 4/13: 450 feet without AD CGA 10/15/21: 460 feet without AD, CGA   Time 8    Period Weeks    Status Achieved    Target Date 02/16/22     PT LONG TERM GOAL #3   Title Patient will increase 10 meter walk test to >1.65ms as to improve gait speed for better community ambulation and to reduce fall risk.    Baseline 9/21: 0.5 m/s with RW; 10/10 deferred 10/12: 0.93 m/s with 43MI 11/2: 0.83 m/s with 46OE 12/7: 0.52 m/s with RW, 1/3: 0.704; 06/25/21: 0.55 m/s with SPC. 5/18: 0.4 m/s without AD , 10/15/21 0.507 m/s without AD, 8/1: 0.64 m/s   Time 8    Period Weeks    Status On going    Target Date 02/16/22     PT LONG TERM GOAL #4   Title Patient will be independent with ascend/descend 6 steps using single UE in step over step pattern without LOB.    Baseline 9/21:  Patient uses PT stairs (4 steps) with bilateral upper extremity support on handrails for both ascending/descending.  Patient exhibits reciprocal pattern ascending and step to pattern descending. 10/10: deferred; 10/12: ascending/descending recip. steps with BUE support; 11/2: Ascended/descended 8 steps  total, reciprocal pattern  ascending and step-to descending. Pt uses UUE support throughout with CGA assist.; 12/7: Ascended and descended 8 steps total with UUE support and CGA. Reciprocal stepping ascending and step to descending., 1/3: Deferrred; 06/25/21: able to perform with SUE support and reciprocal pattern asc/desc, but does require close CGA with descending. 5/18: independent with 2 handrails, close supervision with 1 handrai; 10/15/21: requires 2 handrails, 8/1: requires 1 handrail, descends one step at a time   Time 8    Period Weeks    Status Partially Met    Target Date 02/16/22     PT LONG TERM GOAL #5   Title Patient will demonstrate an improved Berg Balance Score of >45/56 as to demonstrate improved balance with ADLs such as sitting/standing and transfer balance and reduced fall risk.    Baseline 9/21: deferred d/t time; 9/29: 42/56; 10/10: deferred; 10/12: 44/56; 11/2: deferred; 11/7: 46/56, 1/3: deferred    Time 8    Period Weeks    Status Achieved    Target Date 12/17/21     PT LONG TERM GOAL #6   Title Patient will improve FOTO score to >60% to indicate improved functional mobility with ADLs.    Baseline 9/21: 59; 10/10: 57; 11/2: 60; 12/7: 58%, 1/3: 61%, 4/13: 56% 10/15/21: 58%, 8/1: 56%   Time 8    Period Weeks    Status Partially Met   Target Date 12/17/21     PT LONG TERM GOAL #7   Title Pt will improve FGA by a minimum of 5 points to indicate clinically significant improvement in reduced risk of falls with walking tasks.    Baseline 06/25/21: 12, 4/13: deferred, 5/18: deferred due to increased ankle discomfort; 6/22: deferred due to elevated BP- will assess next visit; 10/20/21: 8/30, 8/1: 8/30   Time 8    Period Weeks    Status Not met   Target Date 12/17/21             Plan -    Clinical Impression Statement Trial of AMB HIT today with RW to improve stability and shift focus to power, integrated ankle weights. Also commenced with RLE proprioceptive training, barefoot toe taps to  improve stepping accuracy    Pt will continue to benefit from skilled physical therapy intervention to address impairments, improve QOL, and attain therapy goals.    Personal Factors and Comorbidities Age    Comorbidities HTN, CKD, fall risk, type 2 diabetes,    Examination-Activity Limitations Lift;Locomotion Level;Squat;Stairs;Stand;Toileting;Transfers;Bathing    Examination-Participation Restrictions Cleaning;Community Activity;Laundry;Meal Prep;Shop;Volunteer;Driving    Stability/Clinical Decision Making Stable/Uncomplicated    Rehab Potential Fair    PT Frequency 2x / week    PT Duration 8 weeks    PT Treatment/Interventions Cryotherapy;Electrical Stimulation;Moist Heat;Gait training;Stair training;Functional mobility training;Therapeutic activities;Therapeutic exercise;Neuromuscular re-education;Balance training;Patient/family education;Orthotic Fit/Training;Energy conservation    PT Next Visit Plan Treadmill for continued cadence and improved fluidity of gait? Would need to closely monitor BP, standing D2 PNF, gait with head turns   PT Home Exercise Plan No changes this date    Consulted and Agree with Plan of Care Patient            2:50 PM, 12/22/21 Etta Grandchild, PT, DPT Physical Therapist - [facility]  Outpatient Physical Therapy- [place] [phone]      Etta Grandchild PT  12/22/21, 2:09 PM Physical Therapist - [facility]

## 2021-12-22 NOTE — Therapy (Signed)
OUTPATIENT OCCUPATIONAL THERAPY TREATMENT NOTE   Patient Name: Cassandra Schaefer MRN: [id number] DOB:1929-01-07, 88 y.o., female Today's Date: 12/22/2021   REFERRING PROVIDER: Tracie Harrier, MD   OT End of Session - 12/22/21 1314     Visit Number 96    Number of Visits 114    Date for OT Re-Evaluation 12/28/21    Authorization Time Period Reporting period starting 09/03/2021    OT Start Time 1300    OT Stop Time 1345    OT Time Calculation (min) 45 min    Activity Tolerance Patient tolerated treatment well    Behavior During Therapy WFL for tasks assessed/performed                  Past Medical History:  Diagnosis Date   Anemia    Cough    RESOLVING, NO FEVER   Diabetes mellitus without complication ([place])    Gout    Hypertension    Hypothyroidism    PUD (peptic ulcer disease)    Stroke [facility])    Wears dentures    full upper, partial lower   Past Surgical History:  Procedure Laterality Date   AMPUTATION TOE Left 12/14/2017   Procedure: AMPUTATION TOE-4TH MPJ;  Surgeon: Samara Deist, DPM;  Location: [facility];  Service: Podiatry;  Laterality: Left;  IVA LOCAL Diabetic - oral meds   APPENDECTOMY     CATARACT EXTRACTION W/PHACO Right 06/23/2015   Procedure: CATARACT EXTRACTION PHACO AND INTRAOCULAR LENS PLACEMENT (IOC);  Surgeon: Estill Cotta, MD;  Location: [facility];  Service: Ophthalmology;  Laterality: Right;  [place] 01:28 AP% 23.4 CDE 36.14 fluid pack lot # [id number]   CATARACT EXTRACTION W/PHACO Left 07/28/2015   Procedure: CATARACT EXTRACTION PHACO AND INTRAOCULAR LENS PLACEMENT (IOC);  Surgeon: Estill Cotta, MD;  Location: [facility];  Service: Ophthalmology;  Laterality: Left;  [place]    1:29.7 AP%  24.9 CDE   41.32 fluid casette lot #[id number] H exp 09/23/2016   COONOSCOPY     AND ENDOSCOPY   DILATION AND CURETTAGE OF UTERUS     ENDARTERECTOMY Left 11/13/2020   Procedure: Evacuation of left neck hematoma;  Surgeon: Katha Cabal, MD;   Location: [facility];  Service: Vascular;  Laterality: Left;   ENDARTERECTOMY Left 11/13/2020   Procedure: ENDARTERECTOMY CAROTID;  Surgeon: Algernon Huxley, MD;  Location: [facility];  Service: Vascular;  Laterality: Left;   TONSILLECTOMY     TUBAL LIGATION     Patient Active Problem List   Diagnosis Date Noted   Carotid stenosis, symptomatic, with infarction ([place]) 11/13/2020   Gout flare 11/10/2020   UTI (urinary tract infection) 11/10/2020   Left carotid artery stenosis 11/10/2020   Chronic kidney disease 11/10/2020   Left basal ganglia embolic stroke ([place]) 23/55/7322   PAC (premature atrial contraction)    Pre-procedural cardiovascular examination    Ischemic stroke ([place]) 10/20/2020   TIA (transient ischemic attack) 10/19/2020   Right hemiparesis ([place]) 10/19/2020   Type 2 diabetes mellitus with stage 3 chronic kidney disease, without long-term current use of insulin ([place]) 08/24/2018   Hyperlipidemia, unspecified 07/19/2017   Hypertension 07/19/2017   PUD (peptic ulcer disease) 07/19/2017   Type 2 diabetes mellitus ([place]) 07/19/2017   Personal history of gout 08/12/2016   Anemia, unspecified 04/09/2016   Hypothyroidism, acquired 12/10/2015   REFERRING DIAG: CVA  THERAPY DIAG:  Muscle weakness (generalized)  Other lack of coordination  Rationale for Evaluation and Treatment Rehabilitation  PERTINENT HISTORY:   R ankle pain,  gout, T2DM, HTN, CKDIII; R/L carotid endarectomy  PRECAUTIONS: None  SUBJECTIVE: Pt. reports that the water was too cold for her to swim in the pool this past weekend.   PAIN:  Are you having pain? No  OBJECTIVE:   TODAY'S TREATMENT:   Pt. worked on bilateral hand [facility] skills assembling, and constructing a small auto object with extra small sized pieces following a design pattern. The miniature  size of the constructing project was specifically selected for the pt. In order to challenge, and progress right hand [place].  Pt. required  forearm support for  proximal stabilization, and a resistive mat set at the tabletop to further stabilize the small loose pieces. Pt. required further support of the miniature auto from the therapist as the pt. was constructing the pieces together, connecting the washers, and using the Miniature tools. Pt. had difficulty without the stability, however was able to consistently isolate her right 2nd digit to manipulate, turn, and tighten the washers. Pt. continues to work on improving RUE [facility], and hand functioning in order to work towards improving, and maximizing independence with ADLs, and IADL functioning.    OT Short Term Goals - 03/23/21 1657       OT SHORT TERM GOAL #1   Title Pt will perform HEP for RUE strength/coordination independently.    Baseline Eval: HEP not yet established in outpatient, but pt is working with putty from [name]; 12/04/2020; [facility] HEP initiated this day with mod vc after return demo; 01/05/2021: indep with currently program, but ongoing as pt progresses; 01/28/2021: vc to revisit and focus on grip and pinch strengthening with putty; 03/23/21: pt is indep with HEP    Time 6    Period Weeks    Status Achieved    Target Date 03/23/21              OT Long Term Goals - 10/15/21 1322       OT LONG TERM GOAL #1   Title Pt will improve hand writing to 100% legibility with R hand to be able to independently write a check.    Baseline Eval: signature is 75% legible with R hand, requiring extra time; 12/04/2020: no chan to complete.ge from eval; 01/05/2021: Printing is 100% legible, signature is 90% legible, but still requires extra time and effort.; 01/26/21: Signature is 90% legible and speed/fluidity have improved. 20th visit: 90% legible, 03/23/21: 90% legible, extra time 40th visit 90 % legibility with increased time, 50th visit: 90% legible with with increased time required 07/14/2021: 90% legibility. 07/30/2021: 90% legibility, pt. presses too hard through the pen, and pencil. 09/03/2021: 90%  legibility; 10/06/21: Increased time, but pt states she signs cards and writes short notes on a card sufficiently.  Pt states she has no need to write checks anymore as her family assists with this and she has no desire to return to this, but she would like to work on her pressure modulation.  New goal below. 10/15/2021: Pt. conitnues to require increased time to complete note cards, and corespondence.    Time 12    Period Weeks    Status Partially Met    Target Date 12/28/21      OT LONG TERM GOAL #2   Title Pt will improve R hand coordination to enable good manipulation of iphone using R dominant hand      OT LONG TERM GOAL #3   Title Pt will improve GMC throughout RUE to enable pt to reach for and pick up  ADL supplies with R dominant hand without dropping or knocking over objects.    Baseline Eval: Pt reports RUE is clumsy and easily knocks over objects when trying to reach for ADL supplies.  Pt verbalizes that her "aim is off."; 12/04/2020: pt reports slight improvement since eval and has started using her R hand to eat, but still feels clumsy; 01/05/2021: Greatly improved; pt is using silverware to eat with R hand, can comb hair with RUE, still mildly ataxic; 01/26/21: pt reports difficulty picking up objects within a small space (ie; may knock the toothpaste holder down when reaching for toothbrush), but reaching for light/larger objects is improving 20th visit: pt. continues to be mildly ataxic, however is consistently improving with motor control, and [place] while reaching targets; 03/23/21: Pt reaches with accuracy towards targets if she takes her time (pt drops and knocks things over when moving at a faster/normal pace) 04/23/2021: Pt. continues to present with difficulty with depth perception, and impaired [place].40th visit: Pt. is improving, however tends to knock over lighter weight objects.50th visit: Pt. has improved with reaching for items without knocking them over, however continues to need work on  accuracy.07/14/2021: Pt. is improving while motor control, and accuracy of reaching for objects. Pt. continues to work towards improving efficiency with reaching for objects. 07/30/2021: Pt. is able to able to reach for objects more accurately, however continues to present with limited motor control in the Right. 09/03/2021: Pt. Pt. is improving with reaching up without knocking items over. pt. is now able to stand at the sink and reach up without dropping items; 10/06/21: pt estimates she reaches with 85-90% accurate with RUE, occasional dropping. 10/15/2021: Pt. reports 85-90% accureacy, however presents with decreased accuracy the higher, or lower she reaches.    Time 12    Period Weeks    Status On-going    Target Date 12/28/21      OT LONG TERM GOAL #4   Title Pt will improve FOTO score to 68 or better to indicate measurable functional improvement.    Baseline Eval: FOTO 55; 01/05/2021: FOTO 61; 01/28/21: FOTO 61, 20th visit: 61; 03/23/21: FOTO 60 4oth visit: FOTO score: 61, 50th visit: FOTO 69 07/30/2021: FOTO 71  09/03/2021: FOTO 73; 10/06/21: 73 10/15/2021: 77    Time 12    Period Weeks    Status On-going      OT LONG TERM GOAL #6   Title Pt. will improve right hand [facility] skills to be able to indepedendently manipulate and set-up her medication/pills in the pillbox.    Baseline 07/14/2021: Pt. has difficulty manipulating small pills, and often drops them. 07/30/2021: Pt. continues to have difficulty grasping pills/medication for set-up of pillbox. 09/03/2021: Pt. is able to grasp pills from the pillbox without dropping them; 10/06/21: modified indep (with extra time to complete). 10/15/2021: Pt. reports requiring increased time to complete.    Time 12    Period Weeks    Status Partially Met    Target Date 12/28/21      OT LONG TERM GOAL #7   Title Pt will improve accuracy with graded force through RUE during functional tasks per self report.    Baseline 10/06/21: Pt reports she holds her greatgrandchild  too tightly, scratches her arm too forcefully, and sometimes writes with too much pressure; pt struggles to grade force for functional activities.10/15/2021: Pt. continues to grasp items with the left hand too tightly.    Time 12    Period Weeks  Status Achieved              Plan - 10/22/21 1311     Clinical Impression Statement Pt. required  forearm support for proximal stabilization, and a resistive mat set at the tabletop to further stabilize the small loose pieces. Pt. required further support of the miniature auto from the therapist as the pt. was constructing the pieces together, connecting the washers, and using the Miniature tools. Pt. had difficulty without the stability, however was able to consistently isolate her right 2nd digit to manipulate, turn, and tighten the washers. Pt. continues to work on improving RUE [facility], and hand functioning in order to work towards improving, and maximizing independence with ADLs, and IADL functioning.             OT Occupational Profile and History Detailed Assessment- Review of Records and additional review of physical, cognitive, psychosocial history related to current functional performance    Occupational performance deficits (Please refer to evaluation for details): ADL's;Leisure;IADL's    Body Structure / Function / Physical Skills ADL;Coordination;Endurance;GMC;UE functional use;Balance;Sensation;Body mechanics;IADL;Pain;Dexterity;FMC;Strength;Gait;Mobility    Rehab Potential Good    Clinical Decision Making Several treatment options, min-mod task modification necessary    Comorbidities Affecting Occupational Performance: May have comorbidities impacting occupational performance    Modification or Assistance to Complete Evaluation  Min-Moderate modification of tasks or assist with assess necessary to complete eval    OT Frequency 2x / week    OT Duration 12 weeks    OT Treatment/Interventions Self-care/ADL training;Therapeutic  exercise;DME and/or AE instruction;Functional Mobility Training;Balance training;Neuromuscular education;Therapeutic activities;Patient/family education    Family Member Consulted             Harrel Carina, MS, OTR/L  Harrel Carina, [place] 12/22/2021, 1:20 PM

## 2021-12-22 NOTE — Addendum Note (Signed)
Addended by: Rosamaria Lints on: 12/22/2021 03:48 PM   Modules accepted: Orders

## 2021-12-24 ENCOUNTER — Ambulatory Visit: Payer: Medicare HMO | Admitting: Occupational Therapy

## 2021-12-24 ENCOUNTER — Encounter: Payer: Self-pay | Admitting: Occupational Therapy

## 2021-12-24 ENCOUNTER — Ambulatory Visit: Payer: Medicare HMO

## 2021-12-24 DIAGNOSIS — R262 Difficulty in walking, not elsewhere classified: Secondary | ICD-10-CM

## 2021-12-24 DIAGNOSIS — I639 Cerebral infarction, unspecified: Secondary | ICD-10-CM

## 2021-12-24 DIAGNOSIS — M6281 Muscle weakness (generalized): Secondary | ICD-10-CM

## 2021-12-24 DIAGNOSIS — R269 Unspecified abnormalities of gait and mobility: Secondary | ICD-10-CM

## 2021-12-24 DIAGNOSIS — R278 Other lack of coordination: Secondary | ICD-10-CM

## 2021-12-24 DIAGNOSIS — R482 Apraxia: Secondary | ICD-10-CM

## 2021-12-24 DIAGNOSIS — R2681 Unsteadiness on feet: Secondary | ICD-10-CM

## 2021-12-24 NOTE — Therapy (Signed)
OUTPATIENT PHYSICAL THERAPY TREATMENT NOTE    Patient Name: Cassandra Schaefer MRN: [id number] DOB:Nov 25, 1928, 88 y.o., female Today's Date: 12/24/2021  PCP: Roetta Sessions MD REFERRING PROVIDER: Roetta Sessions MD   PT End of Session - 12/24/21 1434     Visit Number 99    Number of Visits 102    Date for PT Re-Evaluation 02/16/22    Authorization Type [employer]    Authorization Time Period 12/22/21-02/16/22    Progress Note Due on Visit 100    PT Start Time 1403    PT Stop Time 1445    PT Time Calculation (min) 42 min    Equipment Utilized During Treatment Gait belt    Activity Tolerance Patient tolerated treatment well;No increased pain    Behavior During Therapy WFL for tasks assessed/performed                  Past Medical History:  Diagnosis Date   Anemia    Cough    RESOLVING, NO FEVER   Diabetes mellitus without complication ([place])    Gout    Hypertension    Hypothyroidism    PUD (peptic ulcer disease)    Stroke [facility])    Wears dentures    full upper, partial lower   Past Surgical History:  Procedure Laterality Date   AMPUTATION TOE Left 12/14/2017   Procedure: AMPUTATION TOE-4TH MPJ;  Surgeon: Samara Deist, DPM;  Location: [place];  Service: Podiatry;  Laterality: Left;  IVA LOCAL Diabetic - oral meds   APPENDECTOMY     CATARACT EXTRACTION W/PHACO Right 06/23/2015   Procedure: CATARACT EXTRACTION PHACO AND INTRAOCULAR LENS PLACEMENT (IOC);  Surgeon: Estill Cotta, MD;  Location: [facility];  Service: Ophthalmology;  Laterality: Right;  [place] 01:28 AP% 23.4 CDE 36.14 fluid pack lot # [id number]   CATARACT EXTRACTION W/PHACO Left 07/28/2015   Procedure: CATARACT EXTRACTION PHACO AND INTRAOCULAR LENS PLACEMENT (IOC);  Surgeon: Estill Cotta, MD;  Location: [facility];  Service: Ophthalmology;  Laterality: Left;  [place]    1:29.7 AP%  24.9 CDE   41.32 fluid casette lot #[id number] H exp 09/23/2016   COONOSCOPY     AND ENDOSCOPY   DILATION AND CURETTAGE OF  UTERUS     ENDARTERECTOMY Left 11/13/2020   Procedure: Evacuation of left neck hematoma;  Surgeon: Katha Cabal, MD;  Location: [facility];  Service: Vascular;  Laterality: Left;   ENDARTERECTOMY Left 11/13/2020   Procedure: ENDARTERECTOMY CAROTID;  Surgeon: Algernon Huxley, MD;  Location: [facility];  Service: Vascular;  Laterality: Left;   TONSILLECTOMY     TUBAL LIGATION     Patient Active Problem List   Diagnosis Date Noted   Carotid stenosis, symptomatic, with infarction ([place]) 11/13/2020   Gout flare 11/10/2020   UTI (urinary tract infection) 11/10/2020   Left carotid artery stenosis 11/10/2020   Chronic kidney disease 11/10/2020   Left basal ganglia embolic stroke ([place]) 18/84/1660   PAC (premature atrial contraction)    Pre-procedural cardiovascular examination    Ischemic stroke ([place]) 10/20/2020   TIA (transient ischemic attack) 10/19/2020   Right hemiparesis ([place]) 10/19/2020   Type 2 diabetes mellitus with stage 3 chronic kidney disease, without long-term current use of insulin ([place]) 08/24/2018   Hyperlipidemia, unspecified 07/19/2017   Hypertension 07/19/2017   PUD (peptic ulcer disease) 07/19/2017   Type 2 diabetes mellitus ([place]) 07/19/2017   Personal history of gout 08/12/2016   Anemia, unspecified 04/09/2016   Hypothyroidism, acquired 12/10/2015    REFERRING  DIAG: CVA with right hemiparesis  THERAPY DIAG:  Muscle weakness (generalized)  Other lack of coordination  Difficulty in walking, not elsewhere classified  Apraxia  Abnormality of gait and mobility  Unsteadiness on feet  Left basal ganglia embolic stroke [facility])  Rationale for Evaluation and Treatment Rehabilitation  PERTINENT HISTORY: 88 y.o. female with medical history significant for type 2 diabetes mellitus, essential hypertension, acquired hypothyroidism, hyperlipidemia, stage III chronic kidney disease reports increased right sided weakness on 10/19/20. She was diagnosed with left basal ganglia  ischemic stroke. She was discharged to inpatient rehab for 10 days and then discharged home. Patient was evaluated by outpatient PT on 11/12/20. CVA was due to carotid stenosis. She underwent left carotid endartectomy on 7/21. Procedure went well however patient suffered from left cervical hematoma and had subsequent surgical procedure to stop the bleed and remove the hematoma on 11/13/20. They were concerned with her breathing and therefore intubated her for 1 day, she was in ICU for 2 days and transferred to med/surge on 11/15/20. Patient was discharged home on 11/17/20 and is now returning to outpatient PT. She has had the stiches removed and is healing well. Denies any neck pain or stiffness. She lives with her daughter and has 24/7 caregiver support. She is still using RW for most ambulation. She is able to transfer to bedside commode well and is able to stand short time unsupported. She reports feeling very weak and fatigued. She reports she has been dragging her right foot more. She denies any numbness/tingling; She reports feeling like her legs are wanting to do more but is hesitant because of fear of falling. She is mod I for self care morning routine. She does still receive help with showering. She has been working on increasing her activity but denies any formal exercise.   PRECAUTIONS: Fall risk  SUBJECTIVE: Pt is doing well today with no c/o pain or soreness.  Continues to have difficulty with balance.  PAIN:  Are you having pain? No   TODAY'S TREATMENT: 12/24/2021 Neuro Re-Ed: - 159f ambulation with hurrycane, 1.5# AW, CGA for safety; x1 LOB to the left - standing marching no UE support, 1.5# AW - 1360fambulation with hurrycane, 1.5# AW, CGA for safety, no LOB - standing pertubation's at bil shoulders, ribs, and hips; emphasis on hip/ankle righting reaction strategies and stepping strategies for balance - 13558fmbulation with hurrycane, CGA for safety; x2 LOB able to demonstrate  appropriate hip and arm extension reaction strategies. X4 perturbations at bil ribs and hips, with pt demonstrating proper stepping reaction for maintenance of balance  Standing balance on airex with NBOS, CGA for safety: - macarena x2 (shoulder flexion to 90deg, cross arms to chest, bring arms to head, shake it, bring arms to hips, shake it) - static weight shift anteriorly, posteriorly, and laterally (each side), with focus on balance with multidirectional weight shift  Not performed today: -seated hip flexion over obstacle and back x40  -seated eyes open verbcla cues hallux target tapping on floor for accuracy improvement x2 minuntes -seated proprioceptive toe tapping exercise with visual target gaze + eyes closed toe tap, eyes reopen for accuracy assessment  2x4 minutes     PATIENT EDUCATION: Education details: Exercise technique/positioning; HEP Person educated: Patient Education method: ExpConsulting civil engineeremonstration, and Verbal cues Education comprehension: verbalized understanding, returned demonstration, and needs further education   HOME EXERCISE PROGRAM: No changes this session;   Instructed patient in pool exercise: Access Code: [email or web address] URL: [email or web address] Date: 10/22/2021 Prepared by:  Blanche East  Exercises - Seated Knee Extension with Resistance  - 1 x daily - 7 x weekly - 2 sets - 15 reps - Forward and Backward Stepping at [employer]  - 1 x daily - 7 x weekly - 1 sets - 5 reps - Freestyle Swimming with Snorkel Mask  - 1 x daily - 7 x weekly - 1 sets - 2-3 reps - Heel Walking  - 1 x daily - 7 x weekly - 1 sets - 5 reps - Toe Walking  - 1 x daily - 7 x weekly - 1 sets - 5 reps - Single Leg Stance at Pool Wall  - 1 x daily - 7 x weekly - 1 sets - 3-5 reps - 15-30 sec hold   PT Short Term Goals -       PT SHORT TERM GOAL #1   Title Patient will be adherent to HEP at least 3x a week to improve functional strength and balance for better safety  at home.    Baseline 9/21: Pt reports compliance with HEP and is confident, 1/3: doing exercise 2x a week; 06/25/21: 3x/week, 5/18: consistent; 8/1: consistent;    Time 4    Period Weeks    Status Achieved    Target Date 06/25/21      PT SHORT TERM GOAL #2   Title Patient will be independent in transferring sit<>Stand without pushing on arm rests to improve ability to get up from chair.    Baseline 9/21: pt able to complete STS without use of UEs, 1/3:able to stand but has moderate difficulty requiring increased time; 06/25/21: able to perform indep without hands but requiring use of momentum to attain standing. Remains stable. 5/18: able to stand without UE assist, close supervision, remains stable;    Time 4    Period Weeks    Status Achieved    Target Date 07/23/21              PT Long Term Goals -      PT LONG TERM GOAL #1   Title Patient (> 11 years old) will complete five times sit to stand test in < 15 seconds indicating an increased LE strength and improved balance.    Baseline 9/21: 29 seconds hands-free 10/10: deferred 10/12: 34 sec hands free; 11/2: 23.8 sec hands-free; 12/7: 33.2 sec hands free, 1/3: 33.92 hands free; 06/25/21: 25.69 sec, 4/13: 15.5 sec hands free, 5/18: 23 sec without UE 6/22: deferred, 8/1: 15.7 sec without UE   Time 8    Period Weeks    Status On going   Target Date  02/16/22     PT LONG TERM GOAL #2   Title Patient will increase six minute walk test distance to >400 feet for improve gait ability    Baseline 9/21: 368 ft with RW; 10/10: deferred 10/12: 408 ft; 11/2: 476 ft with 4WW, 1/3: 475 feet with RW; 4/13: 450 feet without AD CGA 10/15/21: 460 feet without AD, CGA   Time 8    Period Weeks    Status Achieved    Target Date 02/16/22     PT LONG TERM GOAL #3   Title Patient will increase 10 meter walk test to >1.47ms as to improve gait speed for better community ambulation and to reduce fall risk.    Baseline 9/21: 0.5 m/s with RW; 10/10 deferred  10/12: 0.93 m/s with 41SH 11/2: 0.83 m/s with 47WY 12/7: 0.52 m/s with RW, 1/3: 0.704; 06/25/21:  0.55 m/s with SPC. 5/18: 0.4 m/s without AD , 10/15/21 0.507 m/s without AD, 8/1: 0.64 m/s   Time 8    Period Weeks    Status On going    Target Date 02/16/22     PT LONG TERM GOAL #4   Title Patient will be independent with ascend/descend 6 steps using single UE in step over step pattern without LOB.    Baseline 9/21:  Patient uses PT stairs (4 steps) with bilateral upper extremity support on handrails for both ascending/descending.  Patient exhibits reciprocal pattern ascending and step to pattern descending. 10/10: deferred; 10/12: ascending/descending recip. steps with BUE support; 11/2: Ascended/descended 8 steps total, reciprocal pattern ascending and step-to descending. Pt uses UUE support throughout with CGA assist.; 12/7: Ascended and descended 8 steps total with UUE support and CGA. Reciprocal stepping ascending and step to descending., 1/3: Deferrred; 06/25/21: able to perform with SUE support and reciprocal pattern asc/desc, but does require close CGA with descending. 5/18: independent with 2 handrails, close supervision with 1 handrai; 10/15/21: requires 2 handrails, 8/1: requires 1 handrail, descends one step at a time   Time 8    Period Weeks    Status Partially Met    Target Date 02/16/22     PT LONG TERM GOAL #5   Title Patient will demonstrate an improved Berg Balance Score of >45/56 as to demonstrate improved balance with ADLs such as sitting/standing and transfer balance and reduced fall risk.    Baseline 9/21: deferred d/t time; 9/29: 42/56; 10/10: deferred; 10/12: 44/56; 11/2: deferred; 11/7: 46/56, 1/3: deferred    Time 8    Period Weeks    Status Achieved    Target Date 12/17/21     PT LONG TERM GOAL #6   Title Patient will improve FOTO score to >60% to indicate improved functional mobility with ADLs.    Baseline 9/21: 59; 10/10: 57; 11/2: 60; 12/7: 58%, 1/3: 61%, 4/13: 56%  10/15/21: 58%, 8/1: 56%   Time 8    Period Weeks    Status Partially Met   Target Date 12/17/21     PT LONG TERM GOAL #7   Title Pt will improve FGA by a minimum of 5 points to indicate clinically significant improvement in reduced risk of falls with walking tasks.    Baseline 06/25/21: 12, 4/13: deferred, 5/18: deferred due to increased ankle discomfort; 6/22: deferred due to elevated BP- will assess next visit; 10/20/21: 8/30, 8/1: 8/30   Time 8    Period Weeks    Status Not met   Target Date 12/17/21             Plan -    Clinical Impression Statement Pt tolerated treatment well today. Interventions focused on balance righting reactive strategies for reduced fall risk. This was broken down into corrective multidirectional weight shift with LOB, hip/ankle righting reaction strategies, and stepping strategies with perturbations. Pt demonstrates improvements in balance/LOB and balance righting strategies during gait after these specific interventions. Increased difficulty noted with corrective multidirectional weight shifts on compliant surface. Pt will continue to benefit from skilled OPPT services to address balance deficits for reduced fall risk.    Personal Factors and Comorbidities Age    Comorbidities HTN, CKD, fall risk, type 2 diabetes,    Examination-Activity Limitations Lift;Locomotion Level;Squat;Stairs;Stand;Toileting;Transfers;Bathing    Examination-Participation Restrictions Cleaning;Community Activity;Laundry;Meal Prep;Shop;Volunteer;Driving    Stability/Clinical Decision Making Stable/Uncomplicated    Rehab Potential Fair    PT Frequency 2x / week  PT Duration 8 weeks    PT Treatment/Interventions Cryotherapy;Electrical Stimulation;Moist Heat;Gait training;Stair training;Functional mobility training;Therapeutic activities;Therapeutic exercise;Neuromuscular re-education;Balance training;Patient/family education;Orthotic Fit/Training;Energy conservation    PT Next Visit  Plan Treadmill for continued cadence and improved fluidity of gait? Would need to closely monitor BP; gait with head turns, perturbations and balance reactive strategies   PT Home Exercise Plan No changes this date    Consulted and Agree with Plan of Care Patient             Herminio Commons, PT, DPT 3:42 PM,12/24/21 Physical Therapist - [facility]

## 2021-12-24 NOTE — Therapy (Signed)
OUTPATIENT OCCUPATIONAL THERAPY TREATMENT NOTE   Patient Name: Cassandra Schaefer MRN: [id number] DOB:February 13, 1929, 88 y.o., female Today's Date: 12/24/2021   REFERRING PROVIDER: Tracie Harrier, MD   OT End of Session - 12/24/21 1312     Visit Number 47    Number of Visits 114    Date for OT Re-Evaluation 12/28/21    Authorization Time Period Reporting period starting 09/03/2021    OT Start Time 1300    OT Stop Time 1345    OT Time Calculation (min) 45 min    Activity Tolerance Patient tolerated treatment well    Behavior During Therapy WFL for tasks assessed/performed                  Past Medical History:  Diagnosis Date   Anemia    Cough    RESOLVING, NO FEVER   Diabetes mellitus without complication ([place])    Gout    Hypertension    Hypothyroidism    PUD (peptic ulcer disease)    Stroke [facility])    Wears dentures    full upper, partial lower   Past Surgical History:  Procedure Laterality Date   AMPUTATION TOE Left 12/14/2017   Procedure: AMPUTATION TOE-4TH MPJ;  Surgeon: Samara Deist, DPM;  Location: [place];  Service: Podiatry;  Laterality: Left;  IVA LOCAL Diabetic - oral meds   APPENDECTOMY     CATARACT EXTRACTION W/PHACO Right 06/23/2015   Procedure: CATARACT EXTRACTION PHACO AND INTRAOCULAR LENS PLACEMENT (IOC);  Surgeon: Estill Cotta, MD;  Location: [facility];  Service: Ophthalmology;  Laterality: Right;  [place] 01:28 AP% 23.4 CDE 36.14 fluid pack lot # [id number]   CATARACT EXTRACTION W/PHACO Left 07/28/2015   Procedure: CATARACT EXTRACTION PHACO AND INTRAOCULAR LENS PLACEMENT (IOC);  Surgeon: Estill Cotta, MD;  Location: [facility];  Service: Ophthalmology;  Laterality: Left;  [place]    1:29.7 AP%  24.9 CDE   41.32 fluid casette lot #[id number] H exp 09/23/2016   COONOSCOPY     AND ENDOSCOPY   DILATION AND CURETTAGE OF UTERUS     ENDARTERECTOMY Left 11/13/2020   Procedure: Evacuation of left neck hematoma;  Surgeon: Katha Cabal, MD;   Location: [facility];  Service: Vascular;  Laterality: Left;   ENDARTERECTOMY Left 11/13/2020   Procedure: ENDARTERECTOMY CAROTID;  Surgeon: Algernon Huxley, MD;  Location: [facility];  Service: Vascular;  Laterality: Left;   TONSILLECTOMY     TUBAL LIGATION     Patient Active Problem List   Diagnosis Date Noted   Carotid stenosis, symptomatic, with infarction ([place]) 11/13/2020   Gout flare 11/10/2020   UTI (urinary tract infection) 11/10/2020   Left carotid artery stenosis 11/10/2020   Chronic kidney disease 11/10/2020   Left basal ganglia embolic stroke ([place]) 14/43/1540   PAC (premature atrial contraction)    Pre-procedural cardiovascular examination    Ischemic stroke ([place]) 10/20/2020   TIA (transient ischemic attack) 10/19/2020   Right hemiparesis ([place]) 10/19/2020   Type 2 diabetes mellitus with stage 3 chronic kidney disease, without long-term current use of insulin ([place]) 08/24/2018   Hyperlipidemia, unspecified 07/19/2017   Hypertension 07/19/2017   PUD (peptic ulcer disease) 07/19/2017   Type 2 diabetes mellitus ([place]) 07/19/2017   Personal history of gout 08/12/2016   Anemia, unspecified 04/09/2016   Hypothyroidism, acquired 12/10/2015   REFERRING DIAG: CVA  THERAPY DIAG:  Muscle weakness (generalized)  Rationale for Evaluation and Treatment Rehabilitation  PERTINENT HISTORY:   R ankle pain, gout, T2DM, HTN, CKDIII; R/L  carotid endarectomy  PRECAUTIONS: None  SUBJECTIVE: Pt. reports that the water was too cold for her to swim in the pool this past weekend.   PAIN:  Are you having pain? No  OBJECTIVE:   TODAY'S TREATMENT:   Pt. worked on translatory movements with the right hand using 2  1&1/2"circular spheres. Pt. Worked on clockwise, and counter clockwise movements. Pt. worked on grasping, flipping, turning, and stacking [place] style discs. Pt. Worked on 4 rows of 15 columns. Pt. required visual demonstration, and cues for movement patterns. Pt. worked on speed,  and Soil scientist. Emphasis was placed on grasping the discs, stabilizing each one with the 3rd or 4th digits while turning them with the 2nd digit.  Pt. presented with difficulty moving the circular spheres in both the clockwise, and counter clockwise motions. Pt. presents with difficulty stabilizing the discs with her 3rd digit, and thumb while turning them with her 2nd digit. Pt. had difficulty stacking a series of 4 [place] discs, requiring increased time to complete the task. Pt. Presented with more difficulty manipulating the discs the further she reached across her body when turning them.  Pt. Continues to present with limited motor control, and [place]. Pt. Continues to work on these skills in order to work towards improving, and maximizing independence with ADLs, and IADLs.      OT Short Term Goals - 03/23/21 1657       OT SHORT TERM GOAL #1   Title Pt will perform HEP for RUE strength/coordination independently.    Baseline Eval: HEP not yet established in outpatient, but pt is working with putty from [name]; 12/04/2020; [facility] HEP initiated this day with mod vc after return demo; 01/05/2021: indep with currently program, but ongoing as pt progresses; 01/28/2021: vc to revisit and focus on grip and pinch strengthening with putty; 03/23/21: pt is indep with HEP    Time 6    Period Weeks    Status Achieved    Target Date 03/23/21              OT Long Term Goals - 10/15/21 1322       OT LONG TERM GOAL #1   Title Pt will improve hand writing to 100% legibility with R hand to be able to independently write a check.    Baseline Eval: signature is 75% legible with R hand, requiring extra time; 12/04/2020: no chan to complete.ge from eval; 01/05/2021: Printing is 100% legible, signature is 90% legible, but still requires extra time and effort.; 01/26/21: Signature is 90% legible and speed/fluidity have improved. 20th visit: 90% legible, 03/23/21: 90% legible, extra time 40th visit 90 %  legibility with increased time, 50th visit: 90% legible with with increased time required 07/14/2021: 90% legibility. 07/30/2021: 90% legibility, pt. presses too hard through the pen, and pencil. 09/03/2021: 90% legibility; 10/06/21: Increased time, but pt states she signs cards and writes short notes on a card sufficiently.  Pt states she has no need to write checks anymore as her family assists with this and she has no desire to return to this, but she would like to work on her pressure modulation.  New goal below. 10/15/2021: Pt. conitnues to require increased time to complete note cards, and corespondence.    Time 12    Period Weeks    Status Partially Met    Target Date 12/28/21      OT LONG TERM GOAL #2   Title Pt will improve R hand coordination to enable good  manipulation of iphone using R dominant hand      OT LONG TERM GOAL #3   Title Pt will improve GMC throughout RUE to enable pt to reach for and pick up ADL supplies with R dominant hand without dropping or knocking over objects.    Baseline Eval: Pt reports RUE is clumsy and easily knocks over objects when trying to reach for ADL supplies.  Pt verbalizes that her "aim is off."; 12/04/2020: pt reports slight improvement since eval and has started using her R hand to eat, but still feels clumsy; 01/05/2021: Greatly improved; pt is using silverware to eat with R hand, can comb hair with RUE, still mildly ataxic; 01/26/21: pt reports difficulty picking up objects within a small space (ie; may knock the toothpaste holder down when reaching for toothbrush), but reaching for light/larger objects is improving 20th visit: pt. continues to be mildly ataxic, however is consistently improving with motor control, and [place] while reaching targets; 03/23/21: Pt reaches with accuracy towards targets if she takes her time (pt drops and knocks things over when moving at a faster/normal pace) 04/23/2021: Pt. continues to present with difficulty with depth perception, and  impaired [place].40th visit: Pt. is improving, however tends to knock over lighter weight objects.50th visit: Pt. has improved with reaching for items without knocking them over, however continues to need work on accuracy.07/14/2021: Pt. is improving while motor control, and accuracy of reaching for objects. Pt. continues to work towards improving efficiency with reaching for objects. 07/30/2021: Pt. is able to able to reach for objects more accurately, however continues to present with limited motor control in the Right. 09/03/2021: Pt. Pt. is improving with reaching up without knocking items over. pt. is now able to stand at the sink and reach up without dropping items; 10/06/21: pt estimates she reaches with 85-90% accurate with RUE, occasional dropping. 10/15/2021: Pt. reports 85-90% accureacy, however presents with decreased accuracy the higher, or lower she reaches.    Time 12    Period Weeks    Status On-going    Target Date 12/28/21      OT LONG TERM GOAL #4   Title Pt will improve FOTO score to 68 or better to indicate measurable functional improvement.    Baseline Eval: FOTO 55; 01/05/2021: FOTO 61; 01/28/21: FOTO 61, 20th visit: 61; 03/23/21: FOTO 60 4oth visit: FOTO score: 61, 50th visit: FOTO 69 07/30/2021: FOTO 71  09/03/2021: FOTO 73; 10/06/21: 73 10/15/2021: 77    Time 12    Period Weeks    Status On-going      OT LONG TERM GOAL #6   Title Pt. will improve right hand [facility] skills to be able to indepedendently manipulate and set-up her medication/pills in the pillbox.    Baseline 07/14/2021: Pt. has difficulty manipulating small pills, and often drops them. 07/30/2021: Pt. continues to have difficulty grasping pills/medication for set-up of pillbox. 09/03/2021: Pt. is able to grasp pills from the pillbox without dropping them; 10/06/21: modified indep (with extra time to complete). 10/15/2021: Pt. reports requiring increased time to complete.    Time 12    Period Weeks    Status Partially Met    Target  Date 12/28/21      OT LONG TERM GOAL #7   Title Pt will improve accuracy with graded force through RUE during functional tasks per self report.    Baseline 10/06/21: Pt reports she holds her greatgrandchild too tightly, scratches her arm too forcefully, and sometimes writes with  too much pressure; pt struggles to grade force for functional activities.10/15/2021: Pt. continues to grasp items with the left hand too tightly.    Time 12    Period Weeks    Status Achieved              Plan - 10/22/21 1311     Clinical Impression Statement Pt. presented with difficulty moving the circular spheres in both the clockwise, and counter clockwise motions. Pt. presents with difficulty stabilizing the discs with her 3rd digit, and thumb while turning them with her 2nd digit. Pt. had difficulty stacking a series of 4 [place] discs, requiring increased time to complete the task. Pt. Presented with more difficulty manipulating the discs the further she reached across her body when turning them.  Pt. Continues to present with limited motor control, and [place]. Pt. Continues to work on these skills in order to work towards improving, and maximizing independence with ADLs, and IADLs.            OT Occupational Profile and History Detailed Assessment- Review of Records and additional review of physical, cognitive, psychosocial history related to current functional performance    Occupational performance deficits (Please refer to evaluation for details): ADL's;Leisure;IADL's    Body Structure / Function / Physical Skills ADL;Coordination;Endurance;GMC;UE functional use;Balance;Sensation;Body mechanics;IADL;Pain;Dexterity;FMC;Strength;Gait;Mobility    Rehab Potential Good    Clinical Decision Making Several treatment options, min-mod task modification necessary    Comorbidities Affecting Occupational Performance: May have comorbidities impacting occupational performance    Modification or Assistance to Complete  Evaluation  Min-Moderate modification of tasks or assist with assess necessary to complete eval    OT Frequency 2x / week    OT Duration 12 weeks    OT Treatment/Interventions Self-care/ADL training;Therapeutic exercise;DME and/or AE instruction;Functional Mobility Training;Balance training;Neuromuscular education;Therapeutic activities;Patient/family education    Family Member Consulted             Harrel Carina, MS, OTR/L   Harrel Carina, [place] 12/24/2021, 1:16 PM

## 2021-12-29 ENCOUNTER — Ambulatory Visit: Payer: Medicare HMO

## 2021-12-29 ENCOUNTER — Ambulatory Visit: Payer: Medicare HMO | Admitting: Occupational Therapy

## 2021-12-31 ENCOUNTER — Encounter: Payer: Self-pay | Admitting: Occupational Therapy

## 2021-12-31 ENCOUNTER — Ambulatory Visit: Payer: Medicare HMO

## 2021-12-31 ENCOUNTER — Ambulatory Visit: Payer: Medicare HMO | Attending: Vascular Surgery | Admitting: Occupational Therapy

## 2021-12-31 DIAGNOSIS — R262 Difficulty in walking, not elsewhere classified: Secondary | ICD-10-CM | POA: Insufficient documentation

## 2021-12-31 DIAGNOSIS — R269 Unspecified abnormalities of gait and mobility: Secondary | ICD-10-CM | POA: Diagnosis present

## 2021-12-31 DIAGNOSIS — R4701 Aphasia: Secondary | ICD-10-CM | POA: Insufficient documentation

## 2021-12-31 DIAGNOSIS — R2681 Unsteadiness on feet: Secondary | ICD-10-CM

## 2021-12-31 DIAGNOSIS — R278 Other lack of coordination: Secondary | ICD-10-CM | POA: Diagnosis present

## 2021-12-31 DIAGNOSIS — R2689 Other abnormalities of gait and mobility: Secondary | ICD-10-CM | POA: Diagnosis present

## 2021-12-31 DIAGNOSIS — R482 Apraxia: Secondary | ICD-10-CM | POA: Insufficient documentation

## 2021-12-31 DIAGNOSIS — I639 Cerebral infarction, unspecified: Secondary | ICD-10-CM | POA: Diagnosis present

## 2021-12-31 DIAGNOSIS — R471 Dysarthria and anarthria: Secondary | ICD-10-CM | POA: Diagnosis present

## 2021-12-31 DIAGNOSIS — M6281 Muscle weakness (generalized): Secondary | ICD-10-CM | POA: Insufficient documentation

## 2021-12-31 NOTE — Addendum Note (Signed)
Addended by: Avon Gully on: 12/31/2021 02:25 PM   Modules accepted: Orders

## 2021-12-31 NOTE — Therapy (Signed)
OUTPATIENT PHYSICAL THERAPY TREATMENT NOTE/Physical Therapy Progress Note   Dates of reporting period  11/24/2021   to   12/31/2021     Patient Name: Cassandra Schaefer MRN: [id number] DOB:10-24-1928, 88 y.o., female Today's Date: 12/31/2021  PCP: Roetta Sessions MD REFERRING PROVIDER: Roetta Sessions MD   PT End of Session - 12/31/21 1351     Visit Number 100    Number of Visits 102    Date for PT Re-Evaluation 02/16/22    Authorization Type [employer]    Authorization Time Period 12/22/21-02/16/22    Progress Note Due on Visit 100    PT Start Time 1350    PT Stop Time 1437    PT Time Calculation (min) 47 min    Equipment Utilized During Treatment Gait belt    Activity Tolerance Patient tolerated treatment well;No increased pain    Behavior During Therapy WFL for tasks assessed/performed                   Past Medical History:  Diagnosis Date   Anemia    Cough    RESOLVING, NO FEVER   Diabetes mellitus without complication ([place])    Gout    Hypertension    Hypothyroidism    PUD (peptic ulcer disease)    Stroke [facility])    Wears dentures    full upper, partial lower   Past Surgical History:  Procedure Laterality Date   AMPUTATION TOE Left 12/14/2017   Procedure: AMPUTATION TOE-4TH MPJ;  Surgeon: Samara Deist, DPM;  Location: [place];  Service: Podiatry;  Laterality: Left;  IVA LOCAL Diabetic - oral meds   APPENDECTOMY     CATARACT EXTRACTION W/PHACO Right 06/23/2015   Procedure: CATARACT EXTRACTION PHACO AND INTRAOCULAR LENS PLACEMENT (IOC);  Surgeon: Estill Cotta, MD;  Location: [facility];  Service: Ophthalmology;  Laterality: Right;  [place] 01:28 AP% 23.4 CDE 36.14 fluid pack lot # [id number]   CATARACT EXTRACTION W/PHACO Left 07/28/2015   Procedure: CATARACT EXTRACTION PHACO AND INTRAOCULAR LENS PLACEMENT (IOC);  Surgeon: Estill Cotta, MD;  Location: [facility];  Service: Ophthalmology;  Laterality: Left;  [place]    1:29.7 AP%  24.9 CDE   41.32 fluid  casette lot #[id number] H exp 09/23/2016   COONOSCOPY     AND ENDOSCOPY   DILATION AND CURETTAGE OF UTERUS     ENDARTERECTOMY Left 11/13/2020   Procedure: Evacuation of left neck hematoma;  Surgeon: Katha Cabal, MD;  Location: [facility];  Service: Vascular;  Laterality: Left;   ENDARTERECTOMY Left 11/13/2020   Procedure: ENDARTERECTOMY CAROTID;  Surgeon: Algernon Huxley, MD;  Location: [facility];  Service: Vascular;  Laterality: Left;   TONSILLECTOMY     TUBAL LIGATION     Patient Active Problem List   Diagnosis Date Noted   Carotid stenosis, symptomatic, with infarction ([place]) 11/13/2020   Gout flare 11/10/2020   UTI (urinary tract infection) 11/10/2020   Left carotid artery stenosis 11/10/2020   Chronic kidney disease 11/10/2020   Left basal ganglia embolic stroke ([place]) 51/05/5850   PAC (premature atrial contraction)    Pre-procedural cardiovascular examination    Ischemic stroke ([place]) 10/20/2020   TIA (transient ischemic attack) 10/19/2020   Right hemiparesis ([place]) 10/19/2020   Type 2 diabetes mellitus with stage 3 chronic kidney disease, without long-term current use of insulin ([place]) 08/24/2018   Hyperlipidemia, unspecified 07/19/2017   Hypertension 07/19/2017   PUD (peptic ulcer disease) 07/19/2017   Type 2 diabetes mellitus ([place]) 07/19/2017  Personal history of gout 08/12/2016   Anemia, unspecified 04/09/2016   Hypothyroidism, acquired 12/10/2015    REFERRING DIAG: CVA with right hemiparesis  THERAPY DIAG:  Unsteadiness on feet  Other lack of coordination  Muscle weakness (generalized)  Rationale for Evaluation and Treatment Rehabilitation  PERTINENT HISTORY: 88 y.o. female with medical history significant for type 2 diabetes mellitus, essential hypertension, acquired hypothyroidism, hyperlipidemia, stage III chronic kidney disease reports increased right sided weakness on 10/19/20. She was diagnosed with left basal ganglia ischemic stroke. She was discharged to inpatient  rehab for 10 days and then discharged home. Patient was evaluated by outpatient PT on 11/12/20. CVA was due to carotid stenosis. She underwent left carotid endartectomy on 7/21. Procedure went well however patient suffered from left cervical hematoma and had subsequent surgical procedure to stop the bleed and remove the hematoma on 11/13/20. They were concerned with her breathing and therefore intubated her for 1 day, she was in ICU for 2 days and transferred to med/surge on 11/15/20. Patient was discharged home on 11/17/20 and is now returning to outpatient PT. She has had the stiches removed and is healing well. Denies any neck pain or stiffness. She lives with her daughter and has 24/7 caregiver support. She is still using RW for most ambulation. She is able to transfer to bedside commode well and is able to stand short time unsupported. She reports feeling very weak and fatigued. She reports she has been dragging her right foot more. She denies any numbness/tingling; She reports feeling like her legs are wanting to do more but is hesitant because of fear of falling. She is mod I for self care morning routine. She does still receive help with showering. She has been working on increasing her activity but denies any formal exercise.   PRECAUTIONS: Fall risk  SUBJECTIVE: Pt reports no stumbles/falls. She reports no pain. Pt just graduated from OT. Pt has been monitoring her BP due to some recent high readings. Pt missed a recent appointment due to two of her friends passing away within 8 hours of each other.   PAIN:  Are you having pain? No   TODAY'S TREATMENT: 12/31/2021  TherAct:  Goal retesting completed on this date. Please refer to goal section for details. PT did instruct pt throughout in technique with tests and indications of test performance.  BP, seated, LUE following 5xSTS and 10MWT: 167/49 mmHg HR 75 bpm Retaken after one minute 163/53 mmHg HR 73 bpm. Comments: reports no symptoms      PATIENT EDUCATION: Education details: goal reassessment, indications, plan Person educated: Patient Education method: Explanation, Demonstration, and Verbal cues Education comprehension: verbalized understanding, returned demonstration, and needs further education   HOME EXERCISE PROGRAM: No changes this session;   Instructed patient in pool exercise: Access Code: [email or web address] URL: [email or web address] Date: 10/22/2021 Prepared by: Blanche East  Exercises - Seated Knee Extension with Resistance  - 1 x daily - 7 x weekly - 2 sets - 15 reps - Forward and Backward Stepping at [employer]  - 1 x daily - 7 x weekly - 1 sets - 5 reps - Freestyle Swimming with Snorkel Mask  - 1 x daily - 7 x weekly - 1 sets - 2-3 reps - Heel Walking  - 1 x daily - 7 x weekly - 1 sets - 5 reps - Toe Walking  - 1 x daily - 7 x weekly - 1 sets - 5 reps - Single Leg Stance at [employer]  -  1 x daily - 7 x weekly - 1 sets - 3-5 reps - 15-30 sec hold   PT Short Term Goals -       PT SHORT TERM GOAL #1   Title Patient will be adherent to HEP at least 3x a week to improve functional strength and balance for better safety at home.    Baseline 9/21: Pt reports compliance with HEP and is confident, 1/3: doing exercise 2x a week; 06/25/21: 3x/week, 5/18: consistent; 8/1: consistent;    Time 4    Period Weeks    Status Achieved    Target Date 06/25/21      PT SHORT TERM GOAL #2   Title Patient will be independent in transferring sit<>Stand without pushing on arm rests to improve ability to get up from chair.    Baseline 9/21: pt able to complete STS without use of UEs, 1/3:able to stand but has moderate difficulty requiring increased time; 06/25/21: able to perform indep without hands but requiring use of momentum to attain standing. Remains stable. 5/18: able to stand without UE assist, close supervision, remains stable;    Time 4    Period Weeks    Status Achieved    Target Date 07/23/21               PT Long Term Goals -      PT LONG TERM GOAL #1   Title Patient (> 56 years old) will complete five times sit to stand test in < 15 seconds indicating an increased LE strength and improved balance.    Baseline 9/21: 29 seconds hands-free 10/10: deferred 10/12: 34 sec hands free; 11/2: 23.8 sec hands-free; 12/7: 33.2 sec hands free, 1/3: 33.92 hands free; 06/25/21: 25.69 sec, 4/13: 15.5 sec hands free, 5/18: 23 sec without UE 6/22: deferred, 8/1: 15.7 sec without UE; 9/7: 15 sec without UE    Time 8    Period Weeks    Status Partially met   Target Date  02/16/22     PT LONG TERM GOAL #2   Title Patient will increase six minute walk test distance to >400 feet for improve gait ability    Baseline 9/21: 368 ft with RW; 10/10: deferred 10/12: 408 ft; 11/2: 476 ft with 4WW, 1/3: 475 feet with RW; 4/13: 450 feet without AD CGA 10/15/21: 460 feet without AD, CGA   Time 8    Period Weeks    Status Achieved    Target Date 02/16/22     PT LONG TERM GOAL #3   Title Patient will increase 10 meter walk test to >1.34ms as to improve gait speed for better community ambulation and to reduce fall risk.    Baseline 9/21: 0.5 m/s with RW; 10/10 deferred 10/12: 0.93 m/s with 48AC 11/2: 0.83 m/s with 41YS 12/7: 0.52 m/s with RW, 1/3: 0.704; 06/25/21: 0.55 m/s with SPC. 5/18: 0.4 m/s without AD , 10/15/21 0.507 m/s without AD, 8/1: 0.64 m/s; 9/7: 0.42 m/s with SPC, 0.43 m/s without AD   Time 8    Period Weeks    Status On going    Target Date 02/16/22     PT LONG TERM GOAL #4   Title Patient will be independent with ascend/descend 6 steps using single UE in step over step pattern without LOB.    Baseline 9/21:  Patient uses PT stairs (4 steps) with bilateral upper extremity support on handrails for both ascending/descending.  Patient exhibits reciprocal pattern ascending and step to  pattern descending. 10/10: deferred; 10/12: ascending/descending recip. steps with BUE support; 11/2: Ascended/descended 8  steps total, reciprocal pattern ascending and step-to descending. Pt uses UUE support throughout with CGA assist.; 12/7: Ascended and descended 8 steps total with UUE support and CGA. Reciprocal stepping ascending and step to descending., 1/3: Deferrred; 06/25/21: able to perform with SUE support and reciprocal pattern asc/desc, but does require close CGA with descending. 5/18: independent with 2 handrails, close supervision with 1 handrai; 10/15/21: requires 2 handrails, 8/1: requires 1 handrail, descends one step at a time; 9/7: uses 1 handrail, recip pattern ascending, able to do recip pattern descending but with one instance of LOB where pt self-corrects    Time 8    Period Weeks    Status Partially Met    Target Date 02/16/22     PT LONG TERM GOAL #5   Title Patient will demonstrate an improved Berg Balance Score of >45/56 as to demonstrate improved balance with ADLs such as sitting/standing and transfer balance and reduced fall risk.    Baseline 9/21: deferred d/t time; 9/29: 42/56; 10/10: deferred; 10/12: 44/56; 11/2: deferred; 11/7: 46/56, 1/3: deferred    Time 8    Period Weeks    Status Achieved    Target Date 12/17/21     PT LONG TERM GOAL #6   Title Patient will improve FOTO score to >60% to indicate improved functional mobility with ADLs.    Baseline 9/21: 59; 10/10: 57; 11/2: 60; 12/7: 58%, 1/3: 61%, 4/13: 56% 10/15/21: 58%, 8/1: 56%; 9/7: 67%   Time 8    Period Weeks    Status MET   Target Date 12/17/21     PT LONG TERM GOAL #7   Title Pt will improve FGA by a minimum of 5 points to indicate clinically significant improvement in reduced risk of falls with walking tasks.    Baseline 06/25/21: 12, 4/13: deferred, 5/18: deferred due to increased ankle discomfort; 6/22: deferred due to elevated BP- will assess next visit; 10/20/21: 8/30, 8/1: 8/30; 9/7: 13/30   Time 8    Period Weeks    Status MET   Target Date 12/17/21   PT LONG TERM GOAL #8  Title Pt will improve FGA to >18/30 to  indicate clinically significant improvement in reduced risk of falls with walking tasks and improved balance.  Baseline 9/7: 13/30  Time 7  Period Weeks   Status NEW  Target Date 02/17/2022            Plan -    Clinical Impression Statement Goals reassessed for progress note. Pt making gains AEB achieving FOTO and FGA goal, and improving/partially meeting stair technique and 5xSTS goals. While pt demonstrates progress, her gait speed was decreased with 10MWT. Pt also still with difficulty maintaining balance with turns and with dual task. Will focus on these areas in future sessions. Pt will continue to benefit from skilled OPPT services to address balance deficits for reduced fall risk.    Personal Factors and Comorbidities Age    Comorbidities HTN, CKD, fall risk, type 2 diabetes,    Examination-Activity Limitations Lift;Locomotion Level;Squat;Stairs;Stand;Toileting;Transfers;Bathing    Examination-Participation Restrictions Cleaning;Community Activity;Laundry;Meal Prep;Shop;Volunteer;Driving    Stability/Clinical Decision Making Stable/Uncomplicated    Rehab Potential Fair    PT Frequency 2x / week    PT Duration 8 weeks    PT Treatment/Interventions Cryotherapy;Electrical Stimulation;Moist Heat;Gait training;Stair training;Functional mobility training;Therapeutic activities;Therapeutic exercise;Neuromuscular re-education;Balance training;Patient/family education;Orthotic Fit/Training;Energy conservation    PT Next Visit Plan Treadmill for  continued cadence and improved fluidity of gait? Would need to closely monitor BP; gait with head turns, perturbations and balance reactive strategies   PT Home Exercise Plan No changes this date    Consulted and Agree with Plan of Care Patient             Ricard Dillon PT, DPT  2:56 PM,12/31/21 Physical Therapist - [facility]

## 2021-12-31 NOTE — Therapy (Addendum)
OUTPATIENT OCCUPATIONAL THERAPY TREATMENT NOTE   Patient Name: Cassandra Schaefer MRN: [id number] DOB:1928/12/15, 88 y.o., female Today's Date: 12/31/2021   REFERRING PROVIDER: Tracie Harrier, MD   OT End of Session - 12/31/21 1305     Visit Number 44    Number of Visits 114    Date for OT Re-Evaluation 02/11/22    Authorization Time Period Reporting period starting 09/03/2021    OT Start Time 1305    OT Stop Time 1345    OT Time Calculation (min) 40 min    Equipment Utilized During Treatment tranport chair    Activity Tolerance Patient tolerated treatment well    Behavior During Therapy WFL for tasks assessed/performed                  Past Medical History:  Diagnosis Date   Anemia    Cough    RESOLVING, NO FEVER   Diabetes mellitus without complication ([place])    Gout    Hypertension    Hypothyroidism    PUD (peptic ulcer disease)    Stroke [facility])    Wears dentures    full upper, partial lower   Past Surgical History:  Procedure Laterality Date   AMPUTATION TOE Left 12/14/2017   Procedure: AMPUTATION TOE-4TH MPJ;  Surgeon: Samara Deist, DPM;  Location: [place];  Service: Podiatry;  Laterality: Left;  IVA LOCAL Diabetic - oral meds   APPENDECTOMY     CATARACT EXTRACTION W/PHACO Right 06/23/2015   Procedure: CATARACT EXTRACTION PHACO AND INTRAOCULAR LENS PLACEMENT (IOC);  Surgeon: Estill Cotta, MD;  Location: [facility];  Service: Ophthalmology;  Laterality: Right;  [place] 01:28 AP% 23.4 CDE 36.14 fluid pack lot # [id number]   CATARACT EXTRACTION W/PHACO Left 07/28/2015   Procedure: CATARACT EXTRACTION PHACO AND INTRAOCULAR LENS PLACEMENT (IOC);  Surgeon: Estill Cotta, MD;  Location: [facility];  Service: Ophthalmology;  Laterality: Left;  [place]    1:29.7 AP%  24.9 CDE   41.32 fluid casette lot #[id number] H exp 09/23/2016   COONOSCOPY     AND ENDOSCOPY   DILATION AND CURETTAGE OF UTERUS     ENDARTERECTOMY Left 11/13/2020   Procedure: Evacuation of left  neck hematoma;  Surgeon: Katha Cabal, MD;  Location: [facility];  Service: Vascular;  Laterality: Left;   ENDARTERECTOMY Left 11/13/2020   Procedure: ENDARTERECTOMY CAROTID;  Surgeon: Algernon Huxley, MD;  Location: [facility];  Service: Vascular;  Laterality: Left;   TONSILLECTOMY     TUBAL LIGATION     Patient Active Problem List   Diagnosis Date Noted   Carotid stenosis, symptomatic, with infarction ([place]) 11/13/2020   Gout flare 11/10/2020   UTI (urinary tract infection) 11/10/2020   Left carotid artery stenosis 11/10/2020   Chronic kidney disease 11/10/2020   Left basal ganglia embolic stroke ([place]) 41/32/4401   PAC (premature atrial contraction)    Pre-procedural cardiovascular examination    Ischemic stroke ([place]) 10/20/2020   TIA (transient ischemic attack) 10/19/2020   Right hemiparesis ([place]) 10/19/2020   Type 2 diabetes mellitus with stage 3 chronic kidney disease, without long-term current use of insulin ([place]) 08/24/2018   Hyperlipidemia, unspecified 07/19/2017   Hypertension 07/19/2017   PUD (peptic ulcer disease) 07/19/2017   Type 2 diabetes mellitus ([place]) 07/19/2017   Personal history of gout 08/12/2016   Anemia, unspecified 04/09/2016   Hypothyroidism, acquired 12/10/2015   REFERRING DIAG: CVA  THERAPY DIAG:  Muscle weakness (generalized)  Rationale for Evaluation and Treatment Rehabilitation  PERTINENT HISTORY:  R ankle pain, gout, T2DM, HTN, CKDIII; R/L carotid endarectomy  PRECAUTIONS: None  SUBJECTIVE: Pt. reports that 2 of her friends have passed away.  PAIN:  Are you having pain? No  Measurements were obtained, and goals were reviewed with the Pt. Pt. has made excellent progress overall, and has met her goals. Pt.'s FOTO score has improved to 91. Pt. has improved with RUE strength, pinch strength, motor control, and [place] measurements. Pt. Is now consistently engaging her right hand during daily ADLs, and IADL tasks. Pt. is now able to make her bed, tuck  sheets into the bed, fold laundry, use a buttonhook to button a shirt efficiently, hand pants on a hanger, turn the thin delicate pages of her Bible. Pt. is now appropriate for discharge form OT services.      OT Short Term Goals - 03/23/21 1657       OT SHORT TERM GOAL #1   Title Pt will perform HEP for RUE strength/coordination independently.    Baseline Eval: HEP not yet established in outpatient, but pt is working with putty from [name]; 12/04/2020; [facility] HEP initiated this day with mod vc after return demo; 01/05/2021: indep with currently program, but ongoing as pt progresses; 01/28/2021: vc to revisit and focus on grip and pinch strengthening with putty; 03/23/21: pt is indep with HEP    Time 6    Period Weeks    Status Achieved    Target Date 03/23/21              OT Long Term Goals - 10/15/21 1322       OT LONG TERM GOAL #1   Title Pt will improve hand writing to 100% legibility with R hand to be able to independently write a check.    Baseline Eval: signature is 75% legible with R hand, requiring extra time; 12/04/2020: no chan to complete.ge from eval; 01/05/2021: Printing is 100% legible, signature is 90% legible, but still requires extra time and effort.; 01/26/21: Signature is 90% legible and speed/fluidity have improved. 20th visit: 90% legible, 03/23/21: 90% legible, extra time 40th visit 90 % legibility with increased time, 50th visit: 90% legible with with increased time required 07/14/2021: 90% legibility. 07/30/2021: 90% legibility, pt. presses too hard through the pen, and pencil. 09/03/2021: 90% legibility; 10/06/21: Increased time, but pt states she signs cards and writes short notes on a card sufficiently.  Pt states she has no need to write checks anymore as her family assists with this and she has no desire to return to this, but she would like to work on her pressure modulation.  New goal below. 10/15/2021: Pt. conitnues to require increased time to complete note cards, and  corespondence. 12/31/2021: Pt. Is able to legibly sign her name.   Time 12    Period Weeks    Status Partially Met    Target Date      OT LONG TERM GOAL #2   Title      OT LONG TERM GOAL #3   Title Pt will improve GMC throughout RUE to enable pt to reach for and pick up ADL supplies with R dominant hand without dropping or knocking over objects.    Baseline Eval: Pt reports RUE is clumsy and easily knocks over objects when trying to reach for ADL supplies.  Pt verbalizes that her "aim is off."; 12/04/2020: pt reports slight improvement since eval and has started using her R hand to eat, but still feels clumsy; 01/05/2021: Greatly improved;  pt is using silverware to eat with R hand, can comb hair with RUE, still mildly ataxic; 01/26/21: pt reports difficulty picking up objects within a small space (ie; may knock the toothpaste holder down when reaching for toothbrush), but reaching for light/larger objects is improving 20th visit: pt. continues to be mildly ataxic, however is consistently improving with motor control, and [place] while reaching targets; 03/23/21: Pt reaches with accuracy towards targets if she takes her time (pt drops and knocks things over when moving at a faster/normal pace) 04/23/2021: Pt. continues to present with difficulty with depth perception, and impaired [place].40th visit: Pt. is improving, however tends to knock over lighter weight objects.50th visit: Pt. has improved with reaching for items without knocking them over, however continues to need work on accuracy.07/14/2021: Pt. is improving while motor control, and accuracy of reaching for objects. Pt. continues to work towards improving efficiency with reaching for objects. 07/30/2021: Pt. is able to able to reach for objects more accurately, however continues to present with limited motor control in the Right. 09/03/2021: Pt. Pt. is improving with reaching up without knocking items over. pt. is now able to stand at the sink and reach up  without dropping items; 10/06/21: Pt estimates she reaches with 85-90% accurate with RUE, occasional dropping. 10/15/2021: Pt. reports 85-90% accuracy, however presents with decreased accuracy the higher, or lower she reaches. 12/31/2021: Pt. has improved with the accuracy of her functional reaching in multiple planes.   Time 12    Period Weeks    Status On-going    Target Date 12/28/21      OT LONG TERM GOAL #4   Title Pt will improve FOTO score to 68 or better to indicate measurable functional improvement.    Baseline Eval: FOTO 55; 01/05/2021: FOTO 61; 01/28/21: FOTO 61, 20th visit: 61; 03/23/21: FOTO 60 4oth visit: FOTO score: 61, 50th visit: FOTO 69 07/30/2021: FOTO 71  09/03/2021: FOTO 73; 10/06/21: 73 10/15/2021: 77 12/31/2021:  FOTO: 91   Time 12    Period Weeks    Status On-going      OT LONG TERM GOAL #6   Title Pt. will improve right hand [place] skills to be able to indepedendently manipulate and set-up her medication/pills in the pillbox.    Baseline 07/14/2021: Pt. has difficulty manipulating small pills, and often drops them. 07/30/2021: Pt. continues to have difficulty grasping pills/medication for set-up of pillbox. 09/03/2021: Pt. is able to grasp pills from the pillbox without dropping them; 10/06/21: modified indep (with extra time to complete). 10/15/2021: Pt. reports requiring increased time to complete. 12/31/2021: Pt. Is able to manipulate her pills from the pillbox efficiently.    Time 12    Period Weeks    Status Met   Target Date      OT LONG TERM GOAL #7   Title Pt will improve accuracy with graded force through RUE during functional tasks per self report.    Baseline 10/06/21: Pt reports she holds her greatgrandchild too tightly, scratches her arm too forcefully, and sometimes writes with too much pressure; pt struggles to grade force for functional activities.10/15/2021: Pt. continues to grasp items with the left hand too tightly.    Time 12    Period Weeks    Status Achieved               Plan - 10/22/21 1311     Clinical Impression Statement Measurements were obtained, and goals were reviewed with the Pt. Pt. has made excellent  progress overall, and has met her goals. Pt.'s FOTO score has improved to 91. Pt. has improved with RUE strength, pinch strength, motor control, and [place] measurements. Pt. Is now consistently engaging her right hand during daily ADLs, and IADL tasks. Pt. is now able to make her bed, tuck sheets into the bed, fold laundry, use a buttonhook to button a shirt efficiently, hand pants on a hanger, turn the thin delicate pages of her Bible. Pt. is now appropriate for discharge form OT services.              OT Occupational Profile and History Detailed Assessment- Review of Records and additional review of physical, cognitive, psychosocial history related to current functional performance    Occupational performance deficits (Please refer to evaluation for details): ADL's;Leisure;IADL's    Body Structure / Function / Physical Skills ADL;Coordination;Endurance;GMC;UE functional use;Balance;Sensation;Body mechanics;IADL;Pain;Dexterity;FMC;Strength;Gait;Mobility    Rehab Potential Good    Clinical Decision Making Several treatment options, min-mod task modification necessary    Comorbidities Affecting Occupational Performance: May have comorbidities impacting occupational performance    Modification or Assistance to Complete Evaluation  Min-Moderate modification of tasks or assist with assess necessary to complete eval    OT Frequency 2x / week    OT Duration 12 weeks    OT Treatment/Interventions Self-care/ADL training;Therapeutic exercise;DME and/or AE instruction;Functional Mobility Training;Balance training;Neuromuscular education;Therapeutic activities;Patient/family education    Family Member Consulted             Harrel Carina, MS, OTR/L   Harrel Carina, [place] 12/31/2021, 1:07 PM

## 2022-01-05 ENCOUNTER — Ambulatory Visit: Payer: Medicare HMO | Admitting: Occupational Therapy

## 2022-01-07 ENCOUNTER — Ambulatory Visit: Payer: Medicare HMO | Admitting: Occupational Therapy

## 2022-01-07 ENCOUNTER — Ambulatory Visit: Payer: Medicare HMO

## 2022-01-07 DIAGNOSIS — M6281 Muscle weakness (generalized): Secondary | ICD-10-CM

## 2022-01-07 DIAGNOSIS — R269 Unspecified abnormalities of gait and mobility: Secondary | ICD-10-CM

## 2022-01-07 DIAGNOSIS — R262 Difficulty in walking, not elsewhere classified: Secondary | ICD-10-CM

## 2022-01-07 NOTE — Therapy (Signed)
OUTPATIENT PHYSICAL THERAPY TREATMENT NOTE/Physical Therapy Progress Note   Dates of reporting period  11/24/2021   to   12/31/2021     Patient Name: Cassandra Schaefer MRN: [id number] DOB:20-Jun-1928, 88 y.o., female Today's Date: 01/07/2022  PCP: Roetta Sessions MD REFERRING PROVIDER: Roetta Sessions MD   PT End of Session - 01/07/22 1349     Visit Number 101    Number of Visits 102    Date for PT Re-Evaluation 02/16/22    Authorization Type [employer]    Authorization Time Period 12/22/21-02/16/22    Progress Note Due on Visit 100    PT Start Time 1345    PT Stop Time 1430    PT Time Calculation (min) 45 min    Equipment Utilized During Treatment Gait belt    Activity Tolerance Patient tolerated treatment well;No increased pain    Behavior During Therapy WFL for tasks assessed/performed                   Past Medical History:  Diagnosis Date   Anemia    Cough    RESOLVING, NO FEVER   Diabetes mellitus without complication ([place])    Gout    Hypertension    Hypothyroidism    PUD (peptic ulcer disease)    Stroke [facility])    Wears dentures    full upper, partial lower   Past Surgical History:  Procedure Laterality Date   AMPUTATION TOE Left 12/14/2017   Procedure: AMPUTATION TOE-4TH MPJ;  Surgeon: Samara Deist, DPM;  Location: [place];  Service: Podiatry;  Laterality: Left;  IVA LOCAL Diabetic - oral meds   APPENDECTOMY     CATARACT EXTRACTION W/PHACO Right 06/23/2015   Procedure: CATARACT EXTRACTION PHACO AND INTRAOCULAR LENS PLACEMENT (IOC);  Surgeon: Estill Cotta, MD;  Location: [facility];  Service: Ophthalmology;  Laterality: Right;  [place] 01:28 AP% 23.4 CDE 36.14 fluid pack lot # [id number]   CATARACT EXTRACTION W/PHACO Left 07/28/2015   Procedure: CATARACT EXTRACTION PHACO AND INTRAOCULAR LENS PLACEMENT (IOC);  Surgeon: Estill Cotta, MD;  Location: [facility];  Service: Ophthalmology;  Laterality: Left;  [place]    1:29.7 AP%  24.9 CDE   41.32 fluid  casette lot #[id number] H exp 09/23/2016   COONOSCOPY     AND ENDOSCOPY   DILATION AND CURETTAGE OF UTERUS     ENDARTERECTOMY Left 11/13/2020   Procedure: Evacuation of left neck hematoma;  Surgeon: Katha Cabal, MD;  Location: [facility];  Service: Vascular;  Laterality: Left;   ENDARTERECTOMY Left 11/13/2020   Procedure: ENDARTERECTOMY CAROTID;  Surgeon: Algernon Huxley, MD;  Location: [facility];  Service: Vascular;  Laterality: Left;   TONSILLECTOMY     TUBAL LIGATION     Patient Active Problem List   Diagnosis Date Noted   Carotid stenosis, symptomatic, with infarction ([place]) 11/13/2020   Gout flare 11/10/2020   UTI (urinary tract infection) 11/10/2020   Left carotid artery stenosis 11/10/2020   Chronic kidney disease 11/10/2020   Left basal ganglia embolic stroke ([place]) 51/46/0479   PAC (premature atrial contraction)    Pre-procedural cardiovascular examination    Ischemic stroke ([place]) 10/20/2020   TIA (transient ischemic attack) 10/19/2020   Right hemiparesis ([place]) 10/19/2020   Type 2 diabetes mellitus with stage 3 chronic kidney disease, without long-term current use of insulin ([place]) 08/24/2018   Hyperlipidemia, unspecified 07/19/2017   Hypertension 07/19/2017   PUD (peptic ulcer disease) 07/19/2017   Type 2 diabetes mellitus ([place]) 07/19/2017  Personal history of gout 08/12/2016   Anemia, unspecified 04/09/2016   Hypothyroidism, acquired 12/10/2015    REFERRING DIAG: CVA with right hemiparesis  THERAPY DIAG:  Difficulty in walking, not elsewhere classified  Muscle weakness (generalized)  Abnormality of gait and mobility  Rationale for Evaluation and Treatment Rehabilitation  PERTINENT HISTORY: 88 y.o. female with medical history significant for type 2 diabetes mellitus, essential hypertension, acquired hypothyroidism, hyperlipidemia, stage III chronic kidney disease reports increased right sided weakness on 10/19/20. She was diagnosed with left basal ganglia ischemic  stroke. She was discharged to inpatient rehab for 10 days and then discharged home. Patient was evaluated by outpatient PT on 11/12/20. CVA was due to carotid stenosis. She underwent left carotid endartectomy on 7/21. Procedure went well however patient suffered from left cervical hematoma and had subsequent surgical procedure to stop the bleed and remove the hematoma on 11/13/20. They were concerned with her breathing and therefore intubated her for 1 day, she was in ICU for 2 days and transferred to med/surge on 11/15/20. Patient was discharged home on 11/17/20 and is now returning to outpatient PT. She has had the stiches removed and is healing well. Denies any neck pain or stiffness. She lives with her daughter and has 24/7 caregiver support. She is still using RW for most ambulation. She is able to transfer to bedside commode well and is able to stand short time unsupported. She reports feeling very weak and fatigued. She reports she has been dragging her right foot more. She denies any numbness/tingling; She reports feeling like her legs are wanting to do more but is hesitant because of fear of falling. She is mod I for self care morning routine. She does still receive help with showering. She has been working on increasing her activity but denies any formal exercise.   PRECAUTIONS: Fall risk  SUBJECTIVE: Pt notes no new complaints and has not had any falls.  Pt has a new WBQC that she has been using and she LOVES it.  She's happy that it doesn't fall over nearly as much as her last one did (hurrycane).   PAIN:  Are you having pain? No   TODAY'S TREATMENT: 01/07/2022  Neuro Re-Ed: - 174f ambulation with new WBQC, CGA for safety; x1 LOB to the left - 80 ft backwards ambulation to challenge proprioception and improve  - 1345fambulation with hurrycane, 1.5# AW, CGA for safety, no LOB - standing pertubation's at bil shoulders, ribs, and hips; emphasis on hip/ankle righting reaction strategies and  stepping strategies for balance - 13565fmbulation with hurrycane, CGA for safety; x2 LOB able to demonstrate appropriate hip and arm extension reaction strategies. X4 perturbations at bil ribs and hips, with pt demonstrating proper stepping reaction for maintenance of balance  Lateral weight shifts on rockerboard, 30 sec bouts Forward/Backward weight shifts on rockerboard, 30 sec bouts     TherEx:  Seated leg press, 55#, 2x12 Seated calf extension at leg press machine, attempt made, but pt unable to perform based on the muscle sequence.  Pt given verbal and visual demonstration, but unable to complete full rep.   BP: 162/60 mmHg HR: 105 bpm    PATIENT EDUCATION: Education details: goal reassessment, indications, plan Person educated: Patient Education method: Explanation, Demonstration, and Verbal cues Education comprehension: verbalized understanding, returned demonstration, and needs further education   HOME EXERCISE PROGRAM: No changes this session;   Instructed patient in pool exercise: Access Code: [email or web address] URL: [email or web address] Date: 10/22/2021 Prepared by: MarBlanche East  Exercises - Seated Knee Extension with Resistance  - 1 x daily - 7 x weekly - 2 sets - 15 reps - Forward and Backward Stepping at [employer]  - 1 x daily - 7 x weekly - 1 sets - 5 reps - Freestyle Swimming with Snorkel Mask  - 1 x daily - 7 x weekly - 1 sets - 2-3 reps - Heel Walking  - 1 x daily - 7 x weekly - 1 sets - 5 reps - Toe Walking  - 1 x daily - 7 x weekly - 1 sets - 5 reps - Single Leg Stance at Pool Wall  - 1 x daily - 7 x weekly - 1 sets - 3-5 reps - 15-30 sec hold   PT Short Term Goals -       PT SHORT TERM GOAL #1   Title Patient will be adherent to HEP at least 3x a week to improve functional strength and balance for better safety at home.    Baseline 9/21: Pt reports compliance with HEP and is confident, 1/3: doing exercise 2x a week; 06/25/21: 3x/week, 5/18:  consistent; 8/1: consistent;    Time 4    Period Weeks    Status Achieved    Target Date 06/25/21      PT SHORT TERM GOAL #2   Title Patient will be independent in transferring sit<>Stand without pushing on arm rests to improve ability to get up from chair.    Baseline 9/21: pt able to complete STS without use of UEs, 1/3:able to stand but has moderate difficulty requiring increased time; 06/25/21: able to perform indep without hands but requiring use of momentum to attain standing. Remains stable. 5/18: able to stand without UE assist, close supervision, remains stable;    Time 4    Period Weeks    Status Achieved    Target Date 07/23/21              PT Long Term Goals -      PT LONG TERM GOAL #1   Title Patient (> 79 years old) will complete five times sit to stand test in < 15 seconds indicating an increased LE strength and improved balance.    Baseline 9/21: 29 seconds hands-free 10/10: deferred 10/12: 34 sec hands free; 11/2: 23.8 sec hands-free; 12/7: 33.2 sec hands free, 1/3: 33.92 hands free; 06/25/21: 25.69 sec, 4/13: 15.5 sec hands free, 5/18: 23 sec without UE 6/22: deferred, 8/1: 15.7 sec without UE; 9/7: 15 sec without UE    Time 8    Period Weeks    Status Partially met   Target Date  02/16/22     PT LONG TERM GOAL #2   Title Patient will increase six minute walk test distance to >400 feet for improve gait ability    Baseline 9/21: 368 ft with RW; 10/10: deferred 10/12: 408 ft; 11/2: 476 ft with 4WW, 1/3: 475 feet with RW; 4/13: 450 feet without AD CGA 10/15/21: 460 feet without AD, CGA   Time 8    Period Weeks    Status Achieved    Target Date 02/16/22     PT LONG TERM GOAL #3   Title Patient will increase 10 meter walk test to >1.69ms as to improve gait speed for better community ambulation and to reduce fall risk.    Baseline 9/21: 0.5 m/s with RW; 10/10 deferred 10/12: 0.93 m/s with 42BW 11/2: 0.83 m/s with 43SL 12/7: 0.52 m/s with RW, 1/3:  0.704; 06/25/21: 0.55  m/s with SPC. 5/18: 0.4 m/s without AD , 10/15/21 0.507 m/s without AD, 8/1: 0.64 m/s; 9/7: 0.42 m/s with SPC, 0.43 m/s without AD   Time 8    Period Weeks    Status On going    Target Date 02/16/22     PT LONG TERM GOAL #4   Title Patient will be independent with ascend/descend 6 steps using single UE in step over step pattern without LOB.    Baseline 9/21:  Patient uses PT stairs (4 steps) with bilateral upper extremity support on handrails for both ascending/descending.  Patient exhibits reciprocal pattern ascending and step to pattern descending. 10/10: deferred; 10/12: ascending/descending recip. steps with BUE support; 11/2: Ascended/descended 8 steps total, reciprocal pattern ascending and step-to descending. Pt uses UUE support throughout with CGA assist.; 12/7: Ascended and descended 8 steps total with UUE support and CGA. Reciprocal stepping ascending and step to descending., 1/3: Deferrred; 06/25/21: able to perform with SUE support and reciprocal pattern asc/desc, but does require close CGA with descending. 5/18: independent with 2 handrails, close supervision with 1 handrai; 10/15/21: requires 2 handrails, 8/1: requires 1 handrail, descends one step at a time; 9/7: uses 1 handrail, recip pattern ascending, able to do recip pattern descending but with one instance of LOB where pt self-corrects    Time 8    Period Weeks    Status Partially Met    Target Date 02/16/22     PT LONG TERM GOAL #5   Title Patient will demonstrate an improved Berg Balance Score of >45/56 as to demonstrate improved balance with ADLs such as sitting/standing and transfer balance and reduced fall risk.    Baseline 9/21: deferred d/t time; 9/29: 42/56; 10/10: deferred; 10/12: 44/56; 11/2: deferred; 11/7: 46/56, 1/3: deferred    Time 8    Period Weeks    Status Achieved    Target Date 12/17/21     PT LONG TERM GOAL #6   Title Patient will improve FOTO score to >60% to indicate improved functional mobility with  ADLs.    Baseline 9/21: 59; 10/10: 57; 11/2: 60; 12/7: 58%, 1/3: 61%, 4/13: 56% 10/15/21: 58%, 8/1: 56%; 9/7: 67%   Time 8    Period Weeks    Status MET   Target Date 12/17/21     PT LONG TERM GOAL #7   Title Pt will improve FGA by a minimum of 5 points to indicate clinically significant improvement in reduced risk of falls with walking tasks.    Baseline 06/25/21: 12, 4/13: deferred, 5/18: deferred due to increased ankle discomfort; 6/22: deferred due to elevated BP- will assess next visit; 10/20/21: 8/30, 8/1: 8/30; 9/7: 13/30   Time 8    Period Weeks    Status MET   Target Date 12/17/21   PT LONG TERM GOAL #8  Title Pt will improve FGA to >18/30 to indicate clinically significant improvement in reduced risk of falls with walking tasks and improved balance.  Baseline 9/7: 13/30  Time 7  Period Weeks   Status NEW  Target Date 02/17/2022            Plan -    Clinical Impression Statement Pt performed well with tasks given and put forth goof effort without any adverse reaction when assessing BP/HR.  Pt noted her legs to feel like jello after leg presses, but continued to put forth good effort throughout session.  Pt did have difficulty with sequencing calf press at machine,  so that was discontinued after several attempts.  Pt did well with backwards ambulation and would benefit from continuing treatment at subsequent visits as well.   Pt will continue to benefit from skilled therapy to address remaining deficits in order to improve overall QoL and return to PLOF.       Personal Factors and Comorbidities Age    Comorbidities HTN, CKD, fall risk, type 2 diabetes,    Examination-Activity Limitations Lift;Locomotion Level;Squat;Stairs;Stand;Toileting;Transfers;Bathing    Examination-Participation Restrictions Cleaning;Community Activity;Laundry;Meal Prep;Shop;Volunteer;Driving    Stability/Clinical Decision Making Stable/Uncomplicated    Rehab Potential Fair    PT Frequency 2x / week     PT Duration 8 weeks    PT Treatment/Interventions Cryotherapy;Electrical Stimulation;Moist Heat;Gait training;Stair training;Functional mobility training;Therapeutic activities;Therapeutic exercise;Neuromuscular re-education;Balance training;Patient/family education;Orthotic Fit/Training;Energy conservation    PT Next Visit Plan Treadmill for continued cadence and improved fluidity of gait? Would need to closely monitor BP; gait with head turns, perturbations and balance reactive strategies   PT Home Exercise Plan No changes this date    Consulted and Agree with Plan of Care Patient             Gwenlyn Saran, PT, DPT 01/07/22, 4:07 PM  Physical Therapist - [facility]

## 2022-01-12 ENCOUNTER — Ambulatory Visit: Payer: Medicare HMO | Admitting: Occupational Therapy

## 2022-01-12 ENCOUNTER — Ambulatory Visit: Payer: Medicare HMO

## 2022-01-12 DIAGNOSIS — M6281 Muscle weakness (generalized): Secondary | ICD-10-CM | POA: Diagnosis not present

## 2022-01-12 DIAGNOSIS — R2681 Unsteadiness on feet: Secondary | ICD-10-CM

## 2022-01-12 DIAGNOSIS — R262 Difficulty in walking, not elsewhere classified: Secondary | ICD-10-CM

## 2022-01-12 DIAGNOSIS — I639 Cerebral infarction, unspecified: Secondary | ICD-10-CM

## 2022-01-12 DIAGNOSIS — R2689 Other abnormalities of gait and mobility: Secondary | ICD-10-CM

## 2022-01-12 DIAGNOSIS — R471 Dysarthria and anarthria: Secondary | ICD-10-CM

## 2022-01-12 DIAGNOSIS — R482 Apraxia: Secondary | ICD-10-CM

## 2022-01-12 DIAGNOSIS — R4701 Aphasia: Secondary | ICD-10-CM

## 2022-01-12 DIAGNOSIS — R278 Other lack of coordination: Secondary | ICD-10-CM

## 2022-01-12 DIAGNOSIS — R269 Unspecified abnormalities of gait and mobility: Secondary | ICD-10-CM

## 2022-01-12 NOTE — Therapy (Signed)
OUTPATIENT PHYSICAL THERAPY TREATMENT NOTE    Patient Name: Cassandra Schaefer MRN: [id number] DOB:06/10/1928, 88 y.o., female Today's Date: 01/12/2022  PCP: Roetta Sessions MD REFERRING PROVIDER: Roetta Sessions MD   PT End of Session - 01/12/22 1401     Visit Number 102    Number of Visits 400    Date for PT Re-Evaluation 02/16/22    Authorization Type [employer]    Authorization Time Period 12/22/21-02/16/22    Progress Note Due on Visit 110    PT Start Time 9381    PT Stop Time 1425    PT Time Calculation (min) 33 min    Equipment Utilized During Treatment Gait belt    Activity Tolerance Patient tolerated treatment well;No increased pain    Behavior During Therapy WFL for tasks assessed/performed                   Past Medical History:  Diagnosis Date   Anemia    Cough    RESOLVING, NO FEVER   Diabetes mellitus without complication ([place])    Gout    Hypertension    Hypothyroidism    PUD (peptic ulcer disease)    Stroke [facility])    Wears dentures    full upper, partial lower   Past Surgical History:  Procedure Laterality Date   AMPUTATION TOE Left 12/14/2017   Procedure: AMPUTATION TOE-4TH MPJ;  Surgeon: Samara Deist, DPM;  Location: [place];  Service: Podiatry;  Laterality: Left;  IVA LOCAL Diabetic - oral meds   APPENDECTOMY     CATARACT EXTRACTION W/PHACO Right 06/23/2015   Procedure: CATARACT EXTRACTION PHACO AND INTRAOCULAR LENS PLACEMENT (IOC);  Surgeon: Estill Cotta, MD;  Location: [facility];  Service: Ophthalmology;  Laterality: Right;  [place] 01:28 AP% 23.4 CDE 36.14 fluid pack lot # [id number]   CATARACT EXTRACTION W/PHACO Left 07/28/2015   Procedure: CATARACT EXTRACTION PHACO AND INTRAOCULAR LENS PLACEMENT (IOC);  Surgeon: Estill Cotta, MD;  Location: [facility];  Service: Ophthalmology;  Laterality: Left;  [place]    1:29.7 AP%  24.9 CDE   41.32 fluid casette lot #[id number] H exp 09/23/2016   COONOSCOPY     AND ENDOSCOPY   DILATION AND CURETTAGE  OF UTERUS     ENDARTERECTOMY Left 11/13/2020   Procedure: Evacuation of left neck hematoma;  Surgeon: Katha Cabal, MD;  Location: [facility];  Service: Vascular;  Laterality: Left;   ENDARTERECTOMY Left 11/13/2020   Procedure: ENDARTERECTOMY CAROTID;  Surgeon: Algernon Huxley, MD;  Location: [facility];  Service: Vascular;  Laterality: Left;   TONSILLECTOMY     TUBAL LIGATION     Patient Active Problem List   Diagnosis Date Noted   Carotid stenosis, symptomatic, with infarction ([place]) 11/13/2020   Gout flare 11/10/2020   UTI (urinary tract infection) 11/10/2020   Left carotid artery stenosis 11/10/2020   Chronic kidney disease 11/10/2020   Left basal ganglia embolic stroke ([place]) 38/01/1750   PAC (premature atrial contraction)    Pre-procedural cardiovascular examination    Ischemic stroke ([place]) 10/20/2020   TIA (transient ischemic attack) 10/19/2020   Right hemiparesis ([place]) 10/19/2020   Type 2 diabetes mellitus with stage 3 chronic kidney disease, without long-term current use of insulin ([place]) 08/24/2018   Hyperlipidemia, unspecified 07/19/2017   Hypertension 07/19/2017   PUD (peptic ulcer disease) 07/19/2017   Type 2 diabetes mellitus ([place]) 07/19/2017   Personal history of gout 08/12/2016   Anemia, unspecified 04/09/2016   Hypothyroidism, acquired 12/10/2015  REFERRING DIAG: CVA with right hemiparesis  THERAPY DIAG:  Difficulty in walking, not elsewhere classified  Muscle weakness (generalized)  Abnormality of gait and mobility  Unsteadiness on feet  Other lack of coordination  Apraxia  Left basal ganglia embolic stroke ([facility])  Other abnormalities of gait and mobility  Dysarthria and anarthria  Aphasia  Rationale for Evaluation and Treatment Rehabilitation  PERTINENT HISTORY: 88 y.o. female with medical history significant for type 2 diabetes mellitus, essential hypertension, acquired hypothyroidism, hyperlipidemia, stage III chronic kidney disease reports  increased right sided weakness on 10/19/20. She was diagnosed with left basal ganglia ischemic stroke. She was discharged to inpatient rehab for 10 days and then discharged home. Patient was evaluated by outpatient PT on 11/12/20. CVA was due to carotid stenosis. She underwent left carotid endartectomy on 7/21. Procedure went well however patient suffered from left cervical hematoma and had subsequent surgical procedure to stop the bleed and remove the hematoma on 11/13/20. They were concerned with her breathing and therefore intubated her for 1 day, she was in ICU for 2 days and transferred to med/surge on 11/15/20. Patient was discharged home on 11/17/20 and is now returning to outpatient PT. She has had the stiches removed and is healing well. Denies any neck pain or stiffness. She lives with her daughter and has 24/7 caregiver support. She is still using RW for most ambulation. She is able to transfer to bedside commode well and is able to stand short time unsupported. She reports feeling very weak and fatigued. She reports she has been dragging her right foot more. She denies any numbness/tingling; She reports feeling like her legs are wanting to do more but is hesitant because of fear of falling. She is mod I for self care morning routine. She does still receive help with showering. She has been working on increasing her activity but denies any formal exercise.   PRECAUTIONS: Fall risk  SUBJECTIVE: Pt notes no new complaints and has not had any falls.  Pt has a new WBQC that she has been using and she STILL loves it.     PAIN:  Are you having pain? No   TODAY'S TREATMENT: 01/12/2022 Neuro Re-Ed:  -154f AMB warmup with WBQC LUE, 3-point gait HIIT: RW, 2.5lb AW bilat, 657fintervals at max pace with 60-90sec rest -28sec -27sec (0.7676m -22sec (0.24m23m -20.96sec (0.61m/52m-21.00sec -21.00sec   -Obstacle course WBQC training: red mat, 2 half foam balance beams, hurdle step over, step up/down  step over, walk the plank.     PATIENT EDUCATION: Education details: goal reassessment, indications, plan Person educated: Patient Education method: Explanation, Demonstration, and Verbal cues Education comprehension: verbalized understanding, returned demonstration, and needs further education   HOME EXERCISE PROGRAM: No changes this session;   Instructed patient in pool exercise: Access Code: [email or web address] URL: [email or web address] Date: 10/22/2021 Prepared by: MargaBlanche Eastrcises - Seated Knee Extension with Resistance  - 1 x daily - 7 x weekly - 2 sets - 15 reps - Forward and Backward Stepping at Pool [employer] x daily - 7 x weekly - 1 sets - 5 reps - Freestyle Swimming with Snorkel Mask  - 1 x daily - 7 x weekly - 1 sets - 2-3 reps - Heel Walking  - 1 x daily - 7 x weekly - 1 sets - 5 reps - Toe Walking  - 1 x daily - 7 x weekly - 1 sets - 5 reps - Single Leg Stance at  Pool Wall  - 1 x daily - 7 x weekly - 1 sets - 3-5 reps - 15-30 sec hold   PT Short Term Goals -       PT SHORT TERM GOAL #1   Title Patient will be adherent to HEP at least 3x a week to improve functional strength and balance for better safety at home.    Baseline 9/21: Pt reports compliance with HEP and is confident, 1/3: doing exercise 2x a week; 06/25/21: 3x/week, 5/18: consistent; 8/1: consistent;    Time 4    Period Weeks    Status Achieved    Target Date 06/25/21      PT SHORT TERM GOAL #2   Title Patient will be independent in transferring sit<>Stand without pushing on arm rests to improve ability to get up from chair.    Baseline 9/21: pt able to complete STS without use of UEs, 1/3:able to stand but has moderate difficulty requiring increased time; 06/25/21: able to perform indep without hands but requiring use of momentum to attain standing. Remains stable. 5/18: able to stand without UE assist, close supervision, remains stable;    Time 4    Period Weeks    Status Achieved     Target Date 07/23/21              PT Long Term Goals -      PT LONG TERM GOAL #1   Title Patient (> 59 years old) will complete five times sit to stand test in < 15 seconds indicating an increased LE strength and improved balance.    Baseline 9/21: 29 seconds hands-free 10/10: deferred 10/12: 34 sec hands free; 11/2: 23.8 sec hands-free; 12/7: 33.2 sec hands free, 1/3: 33.92 hands free; 06/25/21: 25.69 sec, 4/13: 15.5 sec hands free, 5/18: 23 sec without UE 6/22: deferred, 8/1: 15.7 sec without UE; 9/7: 15 sec without UE    Time 8    Period Weeks    Status Partially met   Target Date  02/16/22     PT LONG TERM GOAL #2   Title Patient will increase six minute walk test distance to >400 feet for improve gait ability    Baseline 9/21: 368 ft with RW; 10/10: deferred 10/12: 408 ft; 11/2: 476 ft with 4WW, 1/3: 475 feet with RW; 4/13: 450 feet without AD CGA 10/15/21: 460 feet without AD, CGA   Time 8    Period Weeks    Status Achieved    Target Date 02/16/22     PT LONG TERM GOAL #3   Title Patient will increase 10 meter walk test to >1.59ms as to improve gait speed for better community ambulation and to reduce fall risk.    Baseline 9/21: 0.5 m/s with RW; 10/10 deferred 10/12: 0.93 m/s with 41OX 11/2: 0.83 m/s with 40RU 12/7: 0.52 m/s with RW, 1/3: 0.704; 06/25/21: 0.55 m/s with SPC. 5/18: 0.4 m/s without AD , 10/15/21 0.507 m/s without AD, 8/1: 0.64 m/s; 9/7: 0.42 m/s with SPC, 0.43 m/s without AD   Time 8    Period Weeks    Status On going    Target Date 02/16/22     PT LONG TERM GOAL #4   Title Patient will be independent with ascend/descend 6 steps using single UE in step over step pattern without LOB.    Baseline 9/21:  Patient uses PT stairs (4 steps) with bilateral upper extremity support on handrails for both ascending/descending.  Patient exhibits reciprocal pattern  ascending and step to pattern descending. 10/10: deferred; 10/12: ascending/descending recip. steps with BUE  support; 11/2: Ascended/descended 8 steps total, reciprocal pattern ascending and step-to descending. Pt uses UUE support throughout with CGA assist.; 12/7: Ascended and descended 8 steps total with UUE support and CGA. Reciprocal stepping ascending and step to descending., 1/3: Deferrred; 06/25/21: able to perform with SUE support and reciprocal pattern asc/desc, but does require close CGA with descending. 5/18: independent with 2 handrails, close supervision with 1 handrai; 10/15/21: requires 2 handrails, 8/1: requires 1 handrail, descends one step at a time; 9/7: uses 1 handrail, recip pattern ascending, able to do recip pattern descending but with one instance of LOB where pt self-corrects    Time 8    Period Weeks    Status Partially Met    Target Date 02/16/22     PT LONG TERM GOAL #5   Title Patient will demonstrate an improved Berg Balance Score of >45/56 as to demonstrate improved balance with ADLs such as sitting/standing and transfer balance and reduced fall risk.    Baseline 9/21: deferred d/t time; 9/29: 42/56; 10/10: deferred; 10/12: 44/56; 11/2: deferred; 11/7: 46/56, 1/3: deferred    Time 8    Period Weeks    Status Achieved    Target Date 12/17/21     PT LONG TERM GOAL #6   Title Patient will improve FOTO score to >60% to indicate improved functional mobility with ADLs.    Baseline 9/21: 59; 10/10: 57; 11/2: 60; 12/7: 58%, 1/3: 61%, 4/13: 56% 10/15/21: 58%, 8/1: 56%; 9/7: 67%   Time 8    Period Weeks    Status MET   Target Date 12/17/21     PT LONG TERM GOAL #7   Title Pt will improve FGA by a minimum of 5 points to indicate clinically significant improvement in reduced risk of falls with walking tasks.    Baseline 06/25/21: 12, 4/13: deferred, 5/18: deferred due to increased ankle discomfort; 6/22: deferred due to elevated BP- will assess next visit; 10/20/21: 8/30, 8/1: 8/30; 9/7: 13/30   Time 8    Period Weeks    Status MET   Target Date 12/17/21   PT LONG TERM GOAL #8   Title Pt will improve FGA to >18/30 to indicate clinically significant improvement in reduced risk of falls with walking tasks and improved balance.  Baseline 9/7: 13/30  Time 7  Period Weeks   Status NEW  Target Date 02/17/2022            Plan -    Clinical Impression Statement Pt able to advance her HIT training today and obtacle course management with [facility]. Pt motivated to rise to challenge. Pt will continue to benefit from skilled therapy to address remaining deficits in order to improve overall QoL and return to PLOF.      Personal Factors and Comorbidities Age    Comorbidities HTN, CKD, fall risk, type 2 diabetes,    Examination-Activity Limitations Lift;Locomotion Level;Squat;Stairs;Stand;Toileting;Transfers;Bathing    Examination-Participation Restrictions Cleaning;Community Activity;Laundry;Meal Prep;Shop;Volunteer;Driving    Stability/Clinical Decision Making Stable/Uncomplicated    Rehab Potential Fair    PT Frequency 2x / week    PT Duration 8 weeks    PT Treatment/Interventions Cryotherapy;Electrical Stimulation;Moist Heat;Gait training;Stair training;Functional mobility training;Therapeutic activities;Therapeutic exercise;Neuromuscular re-education;Balance training;Patient/family education;Orthotic Fit/Training;Energy conservation    PT Next Visit Plan Treadmill for continued cadence and improved fluidity of gait? Would need to closely monitor BP; gait with head turns, perturbations and balance reactive strategies  PT Home Exercise Plan No changes this date    Consulted and Agree with Plan of Care Patient             01/12/22, 2:20 PM  2:20 PM, 01/12/22 Etta Grandchild, PT, DPT Physical Therapist - [place] [phone]

## 2022-01-14 ENCOUNTER — Ambulatory Visit: Payer: Medicare HMO

## 2022-01-14 ENCOUNTER — Ambulatory Visit: Payer: Medicare HMO | Admitting: Occupational Therapy

## 2022-01-14 DIAGNOSIS — R269 Unspecified abnormalities of gait and mobility: Secondary | ICD-10-CM

## 2022-01-14 DIAGNOSIS — M6281 Muscle weakness (generalized): Secondary | ICD-10-CM

## 2022-01-14 DIAGNOSIS — I639 Cerebral infarction, unspecified: Secondary | ICD-10-CM

## 2022-01-14 DIAGNOSIS — R471 Dysarthria and anarthria: Secondary | ICD-10-CM

## 2022-01-14 DIAGNOSIS — R2689 Other abnormalities of gait and mobility: Secondary | ICD-10-CM

## 2022-01-14 DIAGNOSIS — R482 Apraxia: Secondary | ICD-10-CM

## 2022-01-14 DIAGNOSIS — R262 Difficulty in walking, not elsewhere classified: Secondary | ICD-10-CM

## 2022-01-14 DIAGNOSIS — R2681 Unsteadiness on feet: Secondary | ICD-10-CM

## 2022-01-14 DIAGNOSIS — R278 Other lack of coordination: Secondary | ICD-10-CM

## 2022-01-14 NOTE — Therapy (Signed)
OUTPATIENT PHYSICAL THERAPY TREATMENT NOTE    Patient Name: Cassandra Schaefer MRN: [id number] DOB:1928-11-28, 88 y.o., female Today's Date: 01/14/2022  PCP: Roetta Sessions MD REFERRING PROVIDER: Roetta Sessions MD   PT End of Session - 01/14/22 1527     Visit Number 103    Number of Visits 125    Date for PT Re-Evaluation 02/16/22    Authorization Type [employer]    Authorization Time Period 12/22/21-02/16/22    Progress Note Due on Visit 110    PT Start Time 1345    PT Stop Time 1425    PT Time Calculation (min) 40 min    Equipment Utilized During Treatment Gait belt    Activity Tolerance Patient tolerated treatment well;No increased pain    Behavior During Therapy WFL for tasks assessed/performed                   Past Medical History:  Diagnosis Date   Anemia    Cough    RESOLVING, NO FEVER   Diabetes mellitus without complication ([place])    Gout    Hypertension    Hypothyroidism    PUD (peptic ulcer disease)    Stroke [facility])    Wears dentures    full upper, partial lower   Past Surgical History:  Procedure Laterality Date   AMPUTATION TOE Left 12/14/2017   Procedure: AMPUTATION TOE-4TH MPJ;  Surgeon: Samara Deist, DPM;  Location: [place];  Service: Podiatry;  Laterality: Left;  IVA LOCAL Diabetic - oral meds   APPENDECTOMY     CATARACT EXTRACTION W/PHACO Right 06/23/2015   Procedure: CATARACT EXTRACTION PHACO AND INTRAOCULAR LENS PLACEMENT (IOC);  Surgeon: Estill Cotta, MD;  Location: [facility];  Service: Ophthalmology;  Laterality: Right;  [place] 01:28 AP% 23.4 CDE 36.14 fluid pack lot # [id number]   CATARACT EXTRACTION W/PHACO Left 07/28/2015   Procedure: CATARACT EXTRACTION PHACO AND INTRAOCULAR LENS PLACEMENT (IOC);  Surgeon: Estill Cotta, MD;  Location: [facility];  Service: Ophthalmology;  Laterality: Left;  [place]    1:29.7 AP%  24.9 CDE   41.32 fluid casette lot #[id number] H exp 09/23/2016   COONOSCOPY     AND ENDOSCOPY   DILATION AND CURETTAGE  OF UTERUS     ENDARTERECTOMY Left 11/13/2020   Procedure: Evacuation of left neck hematoma;  Surgeon: Katha Cabal, MD;  Location: [facility];  Service: Vascular;  Laterality: Left;   ENDARTERECTOMY Left 11/13/2020   Procedure: ENDARTERECTOMY CAROTID;  Surgeon: Algernon Huxley, MD;  Location: [facility];  Service: Vascular;  Laterality: Left;   TONSILLECTOMY     TUBAL LIGATION     Patient Active Problem List   Diagnosis Date Noted   Carotid stenosis, symptomatic, with infarction ([place]) 11/13/2020   Gout flare 11/10/2020   UTI (urinary tract infection) 11/10/2020   Left carotid artery stenosis 11/10/2020   Chronic kidney disease 11/10/2020   Left basal ganglia embolic stroke ([place]) 80/32/1224   PAC (premature atrial contraction)    Pre-procedural cardiovascular examination    Ischemic stroke ([place]) 10/20/2020   TIA (transient ischemic attack) 10/19/2020   Right hemiparesis ([place]) 10/19/2020   Type 2 diabetes mellitus with stage 3 chronic kidney disease, without long-term current use of insulin ([place]) 08/24/2018   Hyperlipidemia, unspecified 07/19/2017   Hypertension 07/19/2017   PUD (peptic ulcer disease) 07/19/2017   Type 2 diabetes mellitus ([place]) 07/19/2017   Personal history of gout 08/12/2016   Anemia, unspecified 04/09/2016   Hypothyroidism, acquired 12/10/2015  REFERRING DIAG: CVA with right hemiparesis  THERAPY DIAG:  Difficulty in walking, not elsewhere classified  Muscle weakness (generalized)  Abnormality of gait and mobility  Unsteadiness on feet  Other lack of coordination  Apraxia  Left basal ganglia embolic stroke ([facility])  Other abnormalities of gait and mobility  Dysarthria and anarthria  Rationale for Evaluation and Treatment Rehabilitation  PERTINENT HISTORY: 88 y.o. female with medical history significant for type 2 diabetes mellitus, essential hypertension, acquired hypothyroidism, hyperlipidemia, stage III chronic kidney disease reports increased right  sided weakness on 10/19/20. She was diagnosed with left basal ganglia ischemic stroke. She was discharged to inpatient rehab for 10 days and then discharged home. Patient was evaluated by outpatient PT on 11/12/20. CVA was due to carotid stenosis. She underwent left carotid endartectomy on 7/21. Procedure went well however patient suffered from left cervical hematoma and had subsequent surgical procedure to stop the bleed and remove the hematoma on 11/13/20. They were concerned with her breathing and therefore intubated her for 1 day, she was in ICU for 2 days and transferred to med/surge on 11/15/20. Patient was discharged home on 11/17/20 and is now returning to outpatient PT. She has had the stiches removed and is healing well. Denies any neck pain or stiffness. She lives with her daughter and has 24/7 caregiver support. She is still using RW for most ambulation. She is able to transfer to bedside commode well and is able to stand short time unsupported. She reports feeling very weak and fatigued. She reports she has been dragging her right foot more. She denies any numbness/tingling; She reports feeling like her legs are wanting to do more but is hesitant because of fear of falling. She is mod I for self care morning routine. She does still receive help with showering. She has been working on increasing her activity but denies any formal exercise.   PRECAUTIONS: Fall risk  SUBJECTIVE: Pt notes no new complaints and has not had any falls.  Pt has a new WBQC that she has been using and she STILL loves it.     PAIN:  Are you having pain? No   TODAY'S TREATMENT: 01/14/2022 Neuro Re-Ed: High intensity interval training today 6 intervals with 4WW maximum speed walking over 16f, seated recovery between each 2-3 minutes. -2x with rollator loaded with 55 lbs -4x pulling 15lb sack on the floor (superior engagement of high effort compared to above; wanted to avoid excessive ankle loading due to delicate skin and  excessive hip flexor v extensor activation)   -Obstacle course [facility]: later stepping in narrow // bars, walking the plank (THE STEP elevated 1x), up/down 4 steps with rails, airex balance beam, 2x4 balance beam, AMB over 2 concurrent airexpads, AMB across red mat of destiny (two concurrent) (2 laps back to back over 12 minutes)    PATIENT EDUCATION: Education details: goal reassessment, indications, plan Person educated: Patient Education method: Explanation, Demonstration, and Verbal cues Education comprehension: verbalized understanding, returned demonstration, and needs further education   HOME EXERCISE PROGRAM: No changes this session;   Instructed patient in pool exercise: Access Code: [email or web address] URL: [email or web address] Date: 10/22/2021 Prepared by: MBlanche East Exercises - Seated Knee Extension with Resistance  - 1 x daily - 7 x weekly - 2 sets - 15 reps - Forward and Backward Stepping at [employer] - 1 x daily - 7 x weekly - 1 sets - 5 reps - Freestyle Swimming with Snorkel Mask  - 1 x daily -  7 x weekly - 1 sets - 2-3 reps - Heel Walking  - 1 x daily - 7 x weekly - 1 sets - 5 reps - Toe Walking  - 1 x daily - 7 x weekly - 1 sets - 5 reps - Single Leg Stance at Pool Wall  - 1 x daily - 7 x weekly - 1 sets - 3-5 reps - 15-30 sec hold   PT Short Term Goals -       PT SHORT TERM GOAL #1   Title Patient will be adherent to HEP at least 3x a week to improve functional strength and balance for better safety at home.    Baseline 9/21: Pt reports compliance with HEP and is confident, 1/3: doing exercise 2x a week; 06/25/21: 3x/week, 5/18: consistent; 8/1: consistent;    Time 4    Period Weeks    Status Achieved    Target Date 06/25/21      PT SHORT TERM GOAL #2   Title Patient will be independent in transferring sit<>Stand without pushing on arm rests to improve ability to get up from chair.    Baseline 9/21: pt able to complete STS without use of  UEs, 1/3:able to stand but has moderate difficulty requiring increased time; 06/25/21: able to perform indep without hands but requiring use of momentum to attain standing. Remains stable. 5/18: able to stand without UE assist, close supervision, remains stable;    Time 4    Period Weeks    Status Achieved    Target Date 07/23/21              PT Long Term Goals -      PT LONG TERM GOAL #1   Title Patient (> 42 years old) will complete five times sit to stand test in < 15 seconds indicating an increased LE strength and improved balance.    Baseline 9/21: 29 seconds hands-free 10/10: deferred 10/12: 34 sec hands free; 11/2: 23.8 sec hands-free; 12/7: 33.2 sec hands free, 1/3: 33.92 hands free; 06/25/21: 25.69 sec, 4/13: 15.5 sec hands free, 5/18: 23 sec without UE 6/22: deferred, 8/1: 15.7 sec without UE; 9/7: 15 sec without UE    Time 8    Period Weeks    Status Partially met   Target Date  02/16/22     PT LONG TERM GOAL #2   Title Patient will increase six minute walk test distance to >400 feet for improve gait ability    Baseline 9/21: 368 ft with RW; 10/10: deferred 10/12: 408 ft; 11/2: 476 ft with 4WW, 1/3: 475 feet with RW; 4/13: 450 feet without AD CGA 10/15/21: 460 feet without AD, CGA   Time 8    Period Weeks    Status Achieved    Target Date 02/16/22     PT LONG TERM GOAL #3   Title Patient will increase 10 meter walk test to >1.30ms as to improve gait speed for better community ambulation and to reduce fall risk.    Baseline 9/21: 0.5 m/s with RW; 10/10 deferred 10/12: 0.93 m/s with 45OY 11/2: 0.83 m/s with 47XA 12/7: 0.52 m/s with RW, 1/3: 0.704; 06/25/21: 0.55 m/s with SPC. 5/18: 0.4 m/s without AD , 10/15/21 0.507 m/s without AD, 8/1: 0.64 m/s; 9/7: 0.42 m/s with SPC, 0.43 m/s without AD   Time 8    Period Weeks    Status On going    Target Date 02/16/22     PT LONG TERM  GOAL #4   Title Patient will be independent with ascend/descend 6 steps using single UE in step over  step pattern without LOB.    Baseline 9/21:  Patient uses PT stairs (4 steps) with bilateral upper extremity support on handrails for both ascending/descending.  Patient exhibits reciprocal pattern ascending and step to pattern descending. 10/10: deferred; 10/12: ascending/descending recip. steps with BUE support; 11/2: Ascended/descended 8 steps total, reciprocal pattern ascending and step-to descending. Pt uses UUE support throughout with CGA assist.; 12/7: Ascended and descended 8 steps total with UUE support and CGA. Reciprocal stepping ascending and step to descending., 1/3: Deferrred; 06/25/21: able to perform with SUE support and reciprocal pattern asc/desc, but does require close CGA with descending. 5/18: independent with 2 handrails, close supervision with 1 handrai; 10/15/21: requires 2 handrails, 8/1: requires 1 handrail, descends one step at a time; 9/7: uses 1 handrail, recip pattern ascending, able to do recip pattern descending but with one instance of LOB where pt self-corrects    Time 8    Period Weeks    Status Partially Met    Target Date 02/16/22     PT LONG TERM GOAL #5   Title Patient will demonstrate an improved Berg Balance Score of >45/56 as to demonstrate improved balance with ADLs such as sitting/standing and transfer balance and reduced fall risk.    Baseline 9/21: deferred d/t time; 9/29: 42/56; 10/10: deferred; 10/12: 44/56; 11/2: deferred; 11/7: 46/56, 1/3: deferred    Time 8    Period Weeks    Status Achieved    Target Date 12/17/21     PT LONG TERM GOAL #6   Title Patient will improve FOTO score to >60% to indicate improved functional mobility with ADLs.    Baseline 9/21: 59; 10/10: 57; 11/2: 60; 12/7: 58%, 1/3: 61%, 4/13: 56% 10/15/21: 58%, 8/1: 56%; 9/7: 67%   Time 8    Period Weeks    Status MET   Target Date 12/17/21     PT LONG TERM GOAL #7   Title Pt will improve FGA by a minimum of 5 points to indicate clinically significant improvement in reduced risk of  falls with walking tasks.    Baseline 06/25/21: 12, 4/13: deferred, 5/18: deferred due to increased ankle discomfort; 6/22: deferred due to elevated BP- will assess next visit; 10/20/21: 8/30, 8/1: 8/30; 9/7: 13/30   Time 8    Period Weeks    Status MET   Target Date 12/17/21   PT LONG TERM GOAL #8  Title Pt will improve FGA to >18/30 to indicate clinically significant improvement in reduced risk of falls with walking tasks and improved balance.  Baseline 9/7: 13/30  Time 7  Period Weeks   Status NEW  Target Date 02/17/2022            Plan -    Clinical Impression Statement Continued with high intensity interval training today able to advance resistance. Obstacle course also expanded for greater time component and distance, emphasis on navigating difficult mobility scenarios with [facility]. Pt shows remarkable recall of educational interventions for safety from last session and today. Pt also showing rapid problem solving in gross motor planning conception over the course of interval training. Multiple LOB in session requiring maxA as her ability to recover once a LOB begins is quite limited. Pt motivated to rise to challenge. Pt will continue to benefit from skilled therapy to address remaining deficits in order to improve overall QoL and return to PLOF.  Personal Factors and Comorbidities Age    Comorbidities HTN, CKD, fall risk, type 2 diabetes,    Examination-Activity Limitations Lift;Locomotion Level;Squat;Stairs;Stand;Toileting;Transfers;Bathing    Examination-Participation Restrictions Cleaning;Community Activity;Laundry;Meal Prep;Shop;Volunteer;Driving    Stability/Clinical Decision Making Stable/Uncomplicated    Rehab Potential Fair    PT Frequency 2x / week    PT Duration 8 weeks    PT Treatment/Interventions Cryotherapy;Electrical Stimulation;Moist Heat;Gait training;Stair training;Functional mobility training;Therapeutic activities;Therapeutic exercise;Neuromuscular  re-education;Balance training;Patient/family education;Orthotic Fit/Training;Energy conservation    PT Next Visit Plan Treadmill for continued cadence and improved fluidity of gait? Would need to closely monitor BP; gait with head turns, perturbations and balance reactive strategies   PT Home Exercise Plan No changes this date    Consulted and Agree with Plan of Care Patient             01/14/22, 3:29 PM  3:29 PM, 01/14/22 Etta Grandchild, PT, DPT Physical Therapist - [place] [phone]

## 2022-01-19 ENCOUNTER — Ambulatory Visit: Payer: Medicare HMO | Admitting: Occupational Therapy

## 2022-01-21 ENCOUNTER — Ambulatory Visit: Payer: Medicare HMO | Admitting: Occupational Therapy

## 2022-01-21 ENCOUNTER — Ambulatory Visit: Payer: Medicare HMO | Admitting: Physical Therapy

## 2022-01-21 DIAGNOSIS — M6281 Muscle weakness (generalized): Secondary | ICD-10-CM | POA: Diagnosis not present

## 2022-01-21 DIAGNOSIS — R262 Difficulty in walking, not elsewhere classified: Secondary | ICD-10-CM

## 2022-01-21 DIAGNOSIS — R269 Unspecified abnormalities of gait and mobility: Secondary | ICD-10-CM

## 2022-01-21 DIAGNOSIS — R2681 Unsteadiness on feet: Secondary | ICD-10-CM

## 2022-01-21 NOTE — Therapy (Signed)
OUTPATIENT PHYSICAL THERAPY TREATMENT NOTE    Patient Name: Cassandra Schaefer MRN: [id number] DOB:Sep 06, 1928, 88 y.o., female Today's Date: 01/21/2022  PCP: Roetta Sessions MD REFERRING PROVIDER: Roetta Sessions MD   PT End of Session - 01/21/22 1427     Visit Number 104    Number of Visits 125    Date for PT Re-Evaluation 02/16/22    Authorization Type [employer]    Authorization Time Period 12/22/21-02/16/22    Progress Note Due on Visit 110    PT Start Time 1428    PT Stop Time 1512    PT Time Calculation (min) 44 min    Equipment Utilized During Treatment Gait belt    Activity Tolerance Patient tolerated treatment well;No increased pain    Behavior During Therapy WFL for tasks assessed/performed                    Past Medical History:  Diagnosis Date   Anemia    Cough    RESOLVING, NO FEVER   Diabetes mellitus without complication ([place])    Gout    Hypertension    Hypothyroidism    PUD (peptic ulcer disease)    Stroke [facility])    Wears dentures    full upper, partial lower   Past Surgical History:  Procedure Laterality Date   AMPUTATION TOE Left 12/14/2017   Procedure: AMPUTATION TOE-4TH MPJ;  Surgeon: Samara Deist, DPM;  Location: [place];  Service: Podiatry;  Laterality: Left;  IVA LOCAL Diabetic - oral meds   APPENDECTOMY     CATARACT EXTRACTION W/PHACO Right 06/23/2015   Procedure: CATARACT EXTRACTION PHACO AND INTRAOCULAR LENS PLACEMENT (IOC);  Surgeon: Estill Cotta, MD;  Location: [facility];  Service: Ophthalmology;  Laterality: Right;  [place] 01:28 AP% 23.4 CDE 36.14 fluid pack lot # [id number]   CATARACT EXTRACTION W/PHACO Left 07/28/2015   Procedure: CATARACT EXTRACTION PHACO AND INTRAOCULAR LENS PLACEMENT (IOC);  Surgeon: Estill Cotta, MD;  Location: [facility];  Service: Ophthalmology;  Laterality: Left;  [place]    1:29.7 AP%  24.9 CDE   41.32 fluid casette lot #[id number] H exp 09/23/2016   COONOSCOPY     AND ENDOSCOPY   DILATION AND  CURETTAGE OF UTERUS     ENDARTERECTOMY Left 11/13/2020   Procedure: Evacuation of left neck hematoma;  Surgeon: Katha Cabal, MD;  Location: [facility];  Service: Vascular;  Laterality: Left;   ENDARTERECTOMY Left 11/13/2020   Procedure: ENDARTERECTOMY CAROTID;  Surgeon: Algernon Huxley, MD;  Location: [facility];  Service: Vascular;  Laterality: Left;   TONSILLECTOMY     TUBAL LIGATION     Patient Active Problem List   Diagnosis Date Noted   Carotid stenosis, symptomatic, with infarction ([place]) 11/13/2020   Gout flare 11/10/2020   UTI (urinary tract infection) 11/10/2020   Left carotid artery stenosis 11/10/2020   Chronic kidney disease 11/10/2020   Left basal ganglia embolic stroke ([place]) 09/38/1829   PAC (premature atrial contraction)    Pre-procedural cardiovascular examination    Ischemic stroke ([place]) 10/20/2020   TIA (transient ischemic attack) 10/19/2020   Right hemiparesis ([place]) 10/19/2020   Type 2 diabetes mellitus with stage 3 chronic kidney disease, without long-term current use of insulin ([place]) 08/24/2018   Hyperlipidemia, unspecified 07/19/2017   Hypertension 07/19/2017   PUD (peptic ulcer disease) 07/19/2017   Type 2 diabetes mellitus ([place]) 07/19/2017   Personal history of gout 08/12/2016   Anemia, unspecified 04/09/2016   Hypothyroidism, acquired 12/10/2015  REFERRING DIAG: CVA with right hemiparesis  THERAPY DIAG:  No diagnosis found.  Rationale for Evaluation and Treatment Rehabilitation  PERTINENT HISTORY: 88 y.o. female with medical history significant for type 2 diabetes mellitus, essential hypertension, acquired hypothyroidism, hyperlipidemia, stage III chronic kidney disease reports increased right sided weakness on 10/19/20. She was diagnosed with left basal ganglia ischemic stroke. She was discharged to inpatient rehab for 10 days and then discharged home. Patient was evaluated by outpatient PT on 11/12/20. CVA was due to carotid stenosis. She underwent left  carotid endartectomy on 7/21. Procedure went well however patient suffered from left cervical hematoma and had subsequent surgical procedure to stop the bleed and remove the hematoma on 11/13/20. They were concerned with her breathing and therefore intubated her for 1 day, she was in ICU for 2 days and transferred to med/surge on 11/15/20. Patient was discharged home on 11/17/20 and is now returning to outpatient PT. She has had the stiches removed and is healing well. Denies any neck pain or stiffness. She lives with her daughter and has 24/7 caregiver support. She is still using RW for most ambulation. She is able to transfer to bedside commode well and is able to stand short time unsupported. She reports feeling very weak and fatigued. She reports she has been dragging her right foot more. She denies any numbness/tingling; She reports feeling like her legs are wanting to do more but is hesitant because of fear of falling. She is mod I for self care morning routine. She does still receive help with showering. She has been working on increasing her activity but denies any formal exercise.   PRECAUTIONS: Fall risk  SUBJECTIVE: Pt notes no new complaints and has not had any falls.  Pt reports continued difficulties with balance and wobbliness but overall she has been doing well.   PAIN:  Are you having pain? No   TODAY'S TREATMENT: 01/21/2022 Neuro Re-Ed: High intensity interval training today 6 intervals with 4WW maximum speed walking over 68f, seated recovery between each 2-3 minutes. -6X with 4WW loaded 47 #   Step over 3*1/2 foam roll working in R LE step clearance 2 x 10 reps  -progressed to 2 x hurdle, 1 error and pt required cues for proper placement for safety with step over, improvement with prractice  -Obstacle course [facility]: weaving between cones to work on sharp turns in narrow spaces, walking the plank (THE STEP elevated 1x), , airex balance beam, step over hurdles and 1/2 foam  rollers, and ambulating between narrow space with 2 foam rollers parallel to each other 2 laps x 2 reps   PATIENT EDUCATION: Education details: goal reassessment, indications, plan Person educated: Patient Education method: Explanation, Demonstration, and Verbal cues Education comprehension: verbalized understanding, returned demonstration, and needs further education   HOME EXERCISE PROGRAM: No changes this session;   Instructed patient in pool exercise: Access Code: [email or web address] URL: [email or web address] Date: 10/22/2021 Prepared by: MBlanche East Exercises - Seated Knee Extension with Resistance  - 1 x daily - 7 x weekly - 2 sets - 15 reps - Forward and Backward Stepping at [employer] - 1 x daily - 7 x weekly - 1 sets - 5 reps - Freestyle Swimming with Snorkel Mask  - 1 x daily - 7 x weekly - 1 sets - 2-3 reps - Heel Walking  - 1 x daily - 7 x weekly - 1 sets - 5 reps - Toe Walking  - 1 x daily -  7 x weekly - 1 sets - 5 reps - Single Leg Stance at Pool Wall  - 1 x daily - 7 x weekly - 1 sets - 3-5 reps - 15-30 sec hold   PT Short Term Goals -       PT SHORT TERM GOAL #1   Title Patient will be adherent to HEP at least 3x a week to improve functional strength and balance for better safety at home.    Baseline 9/21: Pt reports compliance with HEP and is confident, 1/3: doing exercise 2x a week; 06/25/21: 3x/week, 5/18: consistent; 8/1: consistent;    Time 4    Period Weeks    Status Achieved    Target Date 06/25/21      PT SHORT TERM GOAL #2   Title Patient will be independent in transferring sit<>Stand without pushing on arm rests to improve ability to get up from chair.    Baseline 9/21: pt able to complete STS without use of UEs, 1/3:able to stand but has moderate difficulty requiring increased time; 06/25/21: able to perform indep without hands but requiring use of momentum to attain standing. Remains stable. 5/18: able to stand without UE assist, close  supervision, remains stable;    Time 4    Period Weeks    Status Achieved    Target Date 07/23/21              PT Long Term Goals -      PT LONG TERM GOAL #1   Title Patient (> 59 years old) will complete five times sit to stand test in < 15 seconds indicating an increased LE strength and improved balance.    Baseline 9/21: 29 seconds hands-free 10/10: deferred 10/12: 34 sec hands free; 11/2: 23.8 sec hands-free; 12/7: 33.2 sec hands free, 1/3: 33.92 hands free; 06/25/21: 25.69 sec, 4/13: 15.5 sec hands free, 5/18: 23 sec without UE 6/22: deferred, 8/1: 15.7 sec without UE; 9/7: 15 sec without UE    Time 8    Period Weeks    Status Partially met   Target Date  02/16/22     PT LONG TERM GOAL #2   Title Patient will increase six minute walk test distance to >400 feet for improve gait ability    Baseline 9/21: 368 ft with RW; 10/10: deferred 10/12: 408 ft; 11/2: 476 ft with 4WW, 1/3: 475 feet with RW; 4/13: 450 feet without AD CGA 10/15/21: 460 feet without AD, CGA   Time 8    Period Weeks    Status Achieved    Target Date 02/16/22     PT LONG TERM GOAL #3   Title Patient will increase 10 meter walk test to >1.68ms as to improve gait speed for better community ambulation and to reduce fall risk.    Baseline 9/21: 0.5 m/s with RW; 10/10 deferred 10/12: 0.93 m/s with 41WE 11/2: 0.83 m/s with 49HB 12/7: 0.52 m/s with RW, 1/3: 0.704; 06/25/21: 0.55 m/s with SPC. 5/18: 0.4 m/s without AD , 10/15/21 0.507 m/s without AD, 8/1: 0.64 m/s; 9/7: 0.42 m/s with SPC, 0.43 m/s without AD   Time 8    Period Weeks    Status On going    Target Date 02/16/22     PT LONG TERM GOAL #4   Title Patient will be independent with ascend/descend 6 steps using single UE in step over step pattern without LOB.    Baseline 9/21:  Patient uses PT stairs (4 steps) with  bilateral upper extremity support on handrails for both ascending/descending.  Patient exhibits reciprocal pattern ascending and step to pattern  descending. 10/10: deferred; 10/12: ascending/descending recip. steps with BUE support; 11/2: Ascended/descended 8 steps total, reciprocal pattern ascending and step-to descending. Pt uses UUE support throughout with CGA assist.; 12/7: Ascended and descended 8 steps total with UUE support and CGA. Reciprocal stepping ascending and step to descending., 1/3: Deferrred; 06/25/21: able to perform with SUE support and reciprocal pattern asc/desc, but does require close CGA with descending. 5/18: independent with 2 handrails, close supervision with 1 handrai; 10/15/21: requires 2 handrails, 8/1: requires 1 handrail, descends one step at a time; 9/7: uses 1 handrail, recip pattern ascending, able to do recip pattern descending but with one instance of LOB where pt self-corrects    Time 8    Period Weeks    Status Partially Met    Target Date 02/16/22     PT LONG TERM GOAL #5   Title Patient will demonstrate an improved Berg Balance Score of >45/56 as to demonstrate improved balance with ADLs such as sitting/standing and transfer balance and reduced fall risk.    Baseline 9/21: deferred d/t time; 9/29: 42/56; 10/10: deferred; 10/12: 44/56; 11/2: deferred; 11/7: 46/56, 1/3: deferred    Time 8    Period Weeks    Status Achieved    Target Date 12/17/21     PT LONG TERM GOAL #6   Title Patient will improve FOTO score to >60% to indicate improved functional mobility with ADLs.    Baseline 9/21: 59; 10/10: 57; 11/2: 60; 12/7: 58%, 1/3: 61%, 4/13: 56% 10/15/21: 58%, 8/1: 56%; 9/7: 67%   Time 8    Period Weeks    Status MET   Target Date 12/17/21     PT LONG TERM GOAL #7   Title Pt will improve FGA by a minimum of 5 points to indicate clinically significant improvement in reduced risk of falls with walking tasks.    Baseline 06/25/21: 12, 4/13: deferred, 5/18: deferred due to increased ankle discomfort; 6/22: deferred due to elevated BP- will assess next visit; 10/20/21: 8/30, 8/1: 8/30; 9/7: 13/30   Time 8     Period Weeks    Status MET   Target Date 12/17/21   PT LONG TERM GOAL #8  Title Pt will improve FGA to >18/30 to indicate clinically significant improvement in reduced risk of falls with walking tasks and improved balance.  Baseline 9/7: 13/30  Time 7  Period Weeks   Status NEW  Target Date 02/17/2022            Plan -    Clinical Impression Statement Continued with high intensity interval training today able to continue with resistance on Rolator. Continued with current plan of care as laid out in evaluation and recent prior sessions. Pt remains motivated to advance progress toward goals in order to maximize independence and safety at home. Pt requires high level assistance and cuing for completion of exercises in order to provide adequate level of stimulation and perturbation. Author allows pt as much opportunity as possible to perform independent righting strategies, only stepping in when pt is unable to prevent falling to floor. Pt closely monitored throughout session for safe vitals response and to maximize patient safety during interventions. Pt continues to demonstrate progress toward goals AEB progression of some interventions this date either in volume or intensity.    Personal Factors and Comorbidities Age    Comorbidities HTN, CKD, fall risk,  type 2 diabetes,    Examination-Activity Limitations Lift;Locomotion Level;Squat;Stairs;Stand;Toileting;Transfers;Bathing    Examination-Participation Restrictions Cleaning;Community Activity;Laundry;Meal Prep;Shop;Volunteer;Driving    Stability/Clinical Decision Making Stable/Uncomplicated    Rehab Potential Fair    PT Frequency 2x / week    PT Duration 8 weeks    PT Treatment/Interventions Cryotherapy;Electrical Stimulation;Moist Heat;Gait training;Stair training;Functional mobility training;Therapeutic activities;Therapeutic exercise;Neuromuscular re-education;Balance training;Patient/family education;Orthotic Fit/Training;Energy  conservation    PT Next Visit Plan Continue POC, practice with SPC, interval walking training.    PT Home Exercise Plan No changes this date    Consulted and Agree with Plan of Care Patient             01/21/22, 2:27 PM  2:27 PM, 01/21/22 Etta Grandchild, PT, DPT Physical Therapist - [place] [phone]

## 2022-01-26 ENCOUNTER — Ambulatory Visit: Payer: Medicare HMO | Attending: Vascular Surgery

## 2022-01-26 ENCOUNTER — Ambulatory Visit: Payer: Medicare HMO | Admitting: Occupational Therapy

## 2022-01-26 DIAGNOSIS — M6281 Muscle weakness (generalized): Secondary | ICD-10-CM | POA: Diagnosis present

## 2022-01-26 DIAGNOSIS — I6522 Occlusion and stenosis of left carotid artery: Secondary | ICD-10-CM | POA: Diagnosis present

## 2022-01-26 DIAGNOSIS — R482 Apraxia: Secondary | ICD-10-CM | POA: Insufficient documentation

## 2022-01-26 DIAGNOSIS — G459 Transient cerebral ischemic attack, unspecified: Secondary | ICD-10-CM | POA: Diagnosis present

## 2022-01-26 DIAGNOSIS — R2689 Other abnormalities of gait and mobility: Secondary | ICD-10-CM | POA: Insufficient documentation

## 2022-01-26 DIAGNOSIS — G8191 Hemiplegia, unspecified affecting right dominant side: Secondary | ICD-10-CM | POA: Insufficient documentation

## 2022-01-26 DIAGNOSIS — R269 Unspecified abnormalities of gait and mobility: Secondary | ICD-10-CM | POA: Diagnosis present

## 2022-01-26 DIAGNOSIS — R262 Difficulty in walking, not elsewhere classified: Secondary | ICD-10-CM | POA: Insufficient documentation

## 2022-01-26 DIAGNOSIS — R2681 Unsteadiness on feet: Secondary | ICD-10-CM | POA: Insufficient documentation

## 2022-01-26 DIAGNOSIS — I639 Cerebral infarction, unspecified: Secondary | ICD-10-CM | POA: Diagnosis not present

## 2022-01-26 DIAGNOSIS — R278 Other lack of coordination: Secondary | ICD-10-CM | POA: Diagnosis present

## 2022-01-26 NOTE — Therapy (Signed)
OUTPATIENT PHYSICAL THERAPY TREATMENT NOTE    Patient Name: Infiniti Hoefling MRN: [id number] DOB:07/22/1928, 88 y.o., female Today's Date: 01/26/2022  PCP: Roetta Sessions MD REFERRING PROVIDER: Roetta Sessions MD   PT End of Session - 01/26/22 1454     Visit Number 105    Number of Visits 125    Date for PT Re-Evaluation 02/16/22    Authorization Type [employer]    Authorization Time Period 12/22/21-02/16/22    Progress Note Due on Visit 110    PT Start Time 1355    PT Stop Time 1445    PT Time Calculation (min) 50 min    Equipment Utilized During Treatment Gait belt    Activity Tolerance Patient tolerated treatment well;No increased pain    Behavior During Therapy WFL for tasks assessed/performed                     Past Medical History:  Diagnosis Date   Anemia    Cough    RESOLVING, NO FEVER   Diabetes mellitus without complication ([place])    Gout    Hypertension    Hypothyroidism    PUD (peptic ulcer disease)    Stroke [facility])    Wears dentures    full upper, partial lower   Past Surgical History:  Procedure Laterality Date   AMPUTATION TOE Left 12/14/2017   Procedure: AMPUTATION TOE-4TH MPJ;  Surgeon: Samara Deist, DPM;  Location: [place];  Service: Podiatry;  Laterality: Left;  IVA LOCAL Diabetic - oral meds   APPENDECTOMY     CATARACT EXTRACTION W/PHACO Right 06/23/2015   Procedure: CATARACT EXTRACTION PHACO AND INTRAOCULAR LENS PLACEMENT (IOC);  Surgeon: Estill Cotta, MD;  Location: [facility];  Service: Ophthalmology;  Laterality: Right;  [place] 01:28 AP% 23.4 CDE 36.14 fluid pack lot # [id number]   CATARACT EXTRACTION W/PHACO Left 07/28/2015   Procedure: CATARACT EXTRACTION PHACO AND INTRAOCULAR LENS PLACEMENT (IOC);  Surgeon: Estill Cotta, MD;  Location: [facility];  Service: Ophthalmology;  Laterality: Left;  [place]    1:29.7 AP%  24.9 CDE   41.32 fluid casette lot #[id number] H exp 09/23/2016   COONOSCOPY     AND ENDOSCOPY   DILATION AND  CURETTAGE OF UTERUS     ENDARTERECTOMY Left 11/13/2020   Procedure: Evacuation of left neck hematoma;  Surgeon: Katha Cabal, MD;  Location: [facility];  Service: Vascular;  Laterality: Left;   ENDARTERECTOMY Left 11/13/2020   Procedure: ENDARTERECTOMY CAROTID;  Surgeon: Algernon Huxley, MD;  Location: [facility];  Service: Vascular;  Laterality: Left;   TONSILLECTOMY     TUBAL LIGATION     Patient Active Problem List   Diagnosis Date Noted   Carotid stenosis, symptomatic, with infarction ([place]) 11/13/2020   Gout flare 11/10/2020   UTI (urinary tract infection) 11/10/2020   Left carotid artery stenosis 11/10/2020   Chronic kidney disease 11/10/2020   Left basal ganglia embolic stroke ([place]) 03/00/9233   PAC (premature atrial contraction)    Pre-procedural cardiovascular examination    Ischemic stroke ([place]) 10/20/2020   TIA (transient ischemic attack) 10/19/2020   Right hemiparesis ([place]) 10/19/2020   Type 2 diabetes mellitus with stage 3 chronic kidney disease, without long-term current use of insulin ([place]) 08/24/2018   Hyperlipidemia, unspecified 07/19/2017   Hypertension 07/19/2017   PUD (peptic ulcer disease) 07/19/2017   Type 2 diabetes mellitus ([place]) 07/19/2017   Personal history of gout 08/12/2016   Anemia, unspecified 04/09/2016   Hypothyroidism, acquired 12/10/2015  REFERRING DIAG: CVA with right hemiparesis  THERAPY DIAG:  No diagnosis found.  Rationale for Evaluation and Treatment Rehabilitation  PERTINENT HISTORY: 88 y.o. female with medical history significant for type 2 diabetes mellitus, essential hypertension, acquired hypothyroidism, hyperlipidemia, stage III chronic kidney disease reports increased right sided weakness on 10/19/20. She was diagnosed with left basal ganglia ischemic stroke. She was discharged to inpatient rehab for 10 days and then discharged home. Patient was evaluated by outpatient PT on 11/12/20. CVA was due to carotid stenosis. She underwent left  carotid endartectomy on 7/21. Procedure went well however patient suffered from left cervical hematoma and had subsequent surgical procedure to stop the bleed and remove the hematoma on 11/13/20. They were concerned with her breathing and therefore intubated her for 1 day, she was in ICU for 2 days and transferred to med/surge on 11/15/20. Patient was discharged home on 11/17/20 and is now returning to outpatient PT. She has had the stiches removed and is healing well. Denies any neck pain or stiffness. She lives with her daughter and has 24/7 caregiver support. She is still using RW for most ambulation. She is able to transfer to bedside commode well and is able to stand short time unsupported. She reports feeling very weak and fatigued. She reports she has been dragging her right foot more. She denies any numbness/tingling; She reports feeling like her legs are wanting to do more but is hesitant because of fear of falling. She is mod I for self care morning routine. She does still receive help with showering. She has been working on increasing her activity but denies any formal exercise.   PRECAUTIONS: Fall risk  SUBJECTIVE: Pt notes no new complaints and has not had any falls.  Pt reports continued difficulties with balance and wobbliness but overall she has been doing well.   PAIN:  Are you having pain? No   TODAY'S TREATMENT: 01/26/2022 Gait belt donned, CGA for safety unless otherwise noted  Neuro Re-Ed: - 4WW maximum speed walking over 48f, without seated recovery, loaded with 47#  Cues for 4ww proximity and R foot clearance  - Ambulates [id number] with [facility] 2# AW on RLE to improve foot clearance  - Obstacle course [facility]: weaving between cones to work on sharp turns in narrow spaces, stepping over 3 1/2 foam rolls leading with RLE; SBA-CGA for safety  1st round: 2# AW on RLE to improve foot clearance  2nd round: no AW, improved carry over with foot clearance with 2 episodes of foot  drag which pt was able to correct without external cueing  - airex plank  Heel to toe walking with UUE 2 finger support (FWB/BKWD) x3 laps  Lateral stepping with BUE 2 finger support (FWB/BKWD) x3 laps  - Ambulates 8037fith WBQC with cognitive load; improved RLE foot clearance without external cues  PATIENT EDUCATION: Education details: goal reassessment, indications, plan Person educated: Patient Education method: Explanation, Demonstration, and Verbal cues Education comprehension: verbalized understanding, returned demonstration, and needs further education   HOME EXERCISE PROGRAM: No changes this session;   Instructed patient in pool exercise: Access Code: [email or web address] URL: [email or web address] Date: 10/22/2021 Prepared by: MarBlanche Eastxercises - Seated Knee Extension with Resistance  - 1 x daily - 7 x weekly - 2 sets - 15 reps - Forward and Backward Stepping at [facility] 1 x daily - 7 x weekly - 1 sets - 5 reps - Freestyle Swimming with Snorkel Mask  - 1  x daily - 7 x weekly - 1 sets - 2-3 reps - Heel Walking  - 1 x daily - 7 x weekly - 1 sets - 5 reps - Toe Walking  - 1 x daily - 7 x weekly - 1 sets - 5 reps - Single Leg Stance at Pool Wall  - 1 x daily - 7 x weekly - 1 sets - 3-5 reps - 15-30 sec hold   PT Short Term Goals -       PT SHORT TERM GOAL #1   Title Patient will be adherent to HEP at least 3x a week to improve functional strength and balance for better safety at home.    Baseline 9/21: Pt reports compliance with HEP and is confident, 1/3: doing exercise 2x a week; 06/25/21: 3x/week, 5/18: consistent; 8/1: consistent;    Time 4    Period Weeks    Status Achieved    Target Date 06/25/21      PT SHORT TERM GOAL #2   Title Patient will be independent in transferring sit<>Stand without pushing on arm rests to improve ability to get up from chair.    Baseline 9/21: pt able to complete STS without use of UEs, 1/3:able to stand but has  moderate difficulty requiring increased time; 06/25/21: able to perform indep without hands but requiring use of momentum to attain standing. Remains stable. 5/18: able to stand without UE assist, close supervision, remains stable;    Time 4    Period Weeks    Status Achieved    Target Date 07/23/21              PT Long Term Goals -      PT LONG TERM GOAL #1   Title Patient (> 90 years old) will complete five times sit to stand test in < 15 seconds indicating an increased LE strength and improved balance.    Baseline 9/21: 29 seconds hands-free 10/10: deferred 10/12: 34 sec hands free; 11/2: 23.8 sec hands-free; 12/7: 33.2 sec hands free, 1/3: 33.92 hands free; 06/25/21: 25.69 sec, 4/13: 15.5 sec hands free, 5/18: 23 sec without UE 6/22: deferred, 8/1: 15.7 sec without UE; 9/7: 15 sec without UE    Time 8    Period Weeks    Status Partially met   Target Date  02/16/22     PT LONG TERM GOAL #2   Title Patient will increase six minute walk test distance to >400 feet for improve gait ability    Baseline 9/21: 368 ft with RW; 10/10: deferred 10/12: 408 ft; 11/2: 476 ft with 4WW, 1/3: 475 feet with RW; 4/13: 450 feet without AD CGA 10/15/21: 460 feet without AD, CGA   Time 8    Period Weeks    Status Achieved    Target Date 02/16/22     PT LONG TERM GOAL #3   Title Patient will increase 10 meter walk test to >1.48ms as to improve gait speed for better community ambulation and to reduce fall risk.    Baseline 9/21: 0.5 m/s with RW; 10/10 deferred 10/12: 0.93 m/s with 44BS 11/2: 0.83 m/s with 49GG 12/7: 0.52 m/s with RW, 1/3: 0.704; 06/25/21: 0.55 m/s with SPC. 5/18: 0.4 m/s without AD , 10/15/21 0.507 m/s without AD, 8/1: 0.64 m/s; 9/7: 0.42 m/s with SPC, 0.43 m/s without AD   Time 8    Period Weeks    Status On going    Target Date 02/16/22  PT LONG TERM GOAL #4   Title Patient will be independent with ascend/descend 6 steps using single UE in step over step pattern without LOB.     Baseline 9/21:  Patient uses PT stairs (4 steps) with bilateral upper extremity support on handrails for both ascending/descending.  Patient exhibits reciprocal pattern ascending and step to pattern descending. 10/10: deferred; 10/12: ascending/descending recip. steps with BUE support; 11/2: Ascended/descended 8 steps total, reciprocal pattern ascending and step-to descending. Pt uses UUE support throughout with CGA assist.; 12/7: Ascended and descended 8 steps total with UUE support and CGA. Reciprocal stepping ascending and step to descending., 1/3: Deferrred; 06/25/21: able to perform with SUE support and reciprocal pattern asc/desc, but does require close CGA with descending. 5/18: independent with 2 handrails, close supervision with 1 handrai; 10/15/21: requires 2 handrails, 8/1: requires 1 handrail, descends one step at a time; 9/7: uses 1 handrail, recip pattern ascending, able to do recip pattern descending but with one instance of LOB where pt self-corrects    Time 8    Period Weeks    Status Partially Met    Target Date 02/16/22     PT LONG TERM GOAL #5   Title Patient will demonstrate an improved Berg Balance Score of >45/56 as to demonstrate improved balance with ADLs such as sitting/standing and transfer balance and reduced fall risk.    Baseline 9/21: deferred d/t time; 9/29: 42/56; 10/10: deferred; 10/12: 44/56; 11/2: deferred; 11/7: 46/56, 1/3: deferred    Time 8    Period Weeks    Status Achieved    Target Date 12/17/21     PT LONG TERM GOAL #6   Title Patient will improve FOTO score to >60% to indicate improved functional mobility with ADLs.    Baseline 9/21: 59; 10/10: 57; 11/2: 60; 12/7: 58%, 1/3: 61%, 4/13: 56% 10/15/21: 58%, 8/1: 56%; 9/7: 67%   Time 8    Period Weeks    Status MET   Target Date 12/17/21     PT LONG TERM GOAL #7   Title Pt will improve FGA by a minimum of 5 points to indicate clinically significant improvement in reduced risk of falls with walking tasks.     Baseline 06/25/21: 12, 4/13: deferred, 5/18: deferred due to increased ankle discomfort; 6/22: deferred due to elevated BP- will assess next visit; 10/20/21: 8/30, 8/1: 8/30; 9/7: 13/30   Time 8    Period Weeks    Status MET   Target Date 12/17/21   PT LONG TERM GOAL #8  Title Pt will improve FGA to >18/30 to indicate clinically significant improvement in reduced risk of falls with walking tasks and improved balance.  Baseline 9/7: 13/30  Time 7  Period Weeks   Status NEW  Target Date 02/17/2022            Plan -    Clinical Impression Statement Pt tolerated treatment well today. Pt able to demonstrate improvements in R foot clearance after ambulating/interventions with RLE 2# AW to improve motor control and tone. Improved R foot clearance with dual task/cognitive load without cueing. Obstacle courses with focus on navigating small pathways, turns, and stepping over obstacles. Increased cueing to keep [facility] near BOS, especially with turns. Pt will continue to benefit from skilled OPPT services to address deficits for improved safety/Ind with functional mobility.    Personal Factors and Comorbidities Age    Comorbidities HTN, CKD, fall risk, type 2 diabetes,    Examination-Activity Limitations Lift;Locomotion Level;Squat;Stairs;Stand;Toileting;Transfers;Bathing  Examination-Participation Restrictions Cleaning;Community Activity;Laundry;Meal Prep;Shop;Volunteer;Driving    Stability/Clinical Decision Making Stable/Uncomplicated    Rehab Potential Fair    PT Frequency 2x / week    PT Duration 8 weeks    PT Treatment/Interventions Cryotherapy;Electrical Stimulation;Moist Heat;Gait training;Stair training;Functional mobility training;Therapeutic activities;Therapeutic exercise;Neuromuscular re-education;Balance training;Patient/family education;Orthotic Fit/Training;Energy conservation    PT Next Visit Plan Continue POC, practice with [facility], interval walking training.    PT Home Exercise Plan  No changes this date    Consulted and Agree with Plan of Care Patient            Herminio Commons, PT, DPT 2:56 PM,01/26/22 Physical Therapist - [facility]

## 2022-01-28 ENCOUNTER — Ambulatory Visit: Payer: Medicare HMO

## 2022-01-28 ENCOUNTER — Ambulatory Visit: Payer: Medicare HMO | Admitting: Occupational Therapy

## 2022-01-28 DIAGNOSIS — M6281 Muscle weakness (generalized): Secondary | ICD-10-CM

## 2022-01-28 DIAGNOSIS — I639 Cerebral infarction, unspecified: Secondary | ICD-10-CM | POA: Diagnosis not present

## 2022-01-28 DIAGNOSIS — R278 Other lack of coordination: Secondary | ICD-10-CM

## 2022-01-28 DIAGNOSIS — R2689 Other abnormalities of gait and mobility: Secondary | ICD-10-CM

## 2022-01-28 DIAGNOSIS — R2681 Unsteadiness on feet: Secondary | ICD-10-CM

## 2022-01-28 DIAGNOSIS — R262 Difficulty in walking, not elsewhere classified: Secondary | ICD-10-CM

## 2022-01-28 DIAGNOSIS — R269 Unspecified abnormalities of gait and mobility: Secondary | ICD-10-CM

## 2022-01-28 DIAGNOSIS — R482 Apraxia: Secondary | ICD-10-CM

## 2022-01-28 NOTE — Therapy (Signed)
OUTPATIENT PHYSICAL THERAPY TREATMENT NOTE    Patient Name: Cassandra Schaefer MRN: [id number] DOB:1928-05-01, 88 y.o., female Today's Date: 01/28/2022  PCP: Roetta Sessions MD REFERRING PROVIDER: Roetta Sessions MD   PT End of Session - 01/28/22 1350     Visit Number 106    Number of Visits 125    Date for PT Re-Evaluation 02/16/22    Authorization Type [employer]    Authorization Time Period 12/22/21-02/16/22    Progress Note Due on Visit 110    PT Start Time 1350    PT Stop Time 1430    PT Time Calculation (min) 40 min    Equipment Utilized During Treatment Gait belt    Activity Tolerance Patient tolerated treatment well;No increased pain    Behavior During Therapy WFL for tasks assessed/performed               Past Medical History:  Diagnosis Date   Anemia    Cough    RESOLVING, NO FEVER   Diabetes mellitus without complication ([place])    Gout    Hypertension    Hypothyroidism    PUD (peptic ulcer disease)    Stroke [facility])    Wears dentures    full upper, partial lower   Past Surgical History:  Procedure Laterality Date   AMPUTATION TOE Left 12/14/2017   Procedure: AMPUTATION TOE-4TH MPJ;  Surgeon: Samara Deist, DPM;  Location: [facility];  Service: Podiatry;  Laterality: Left;  IVA LOCAL Diabetic - oral meds   APPENDECTOMY     CATARACT EXTRACTION W/PHACO Right 06/23/2015   Procedure: CATARACT EXTRACTION PHACO AND INTRAOCULAR LENS PLACEMENT (IOC);  Surgeon: Estill Cotta, MD;  Location: [facility];  Service: Ophthalmology;  Laterality: Right;  [place] 01:28 AP% 23.4 CDE 36.14 fluid pack lot # [id number]   CATARACT EXTRACTION W/PHACO Left 07/28/2015   Procedure: CATARACT EXTRACTION PHACO AND INTRAOCULAR LENS PLACEMENT (IOC);  Surgeon: Estill Cotta, MD;  Location: [facility];  Service: Ophthalmology;  Laterality: Left;  [place]    1:29.7 AP%  24.9 CDE   41.32 fluid casette lot #[id number] H exp 09/23/2016   COONOSCOPY     AND ENDOSCOPY   DILATION AND CURETTAGE OF  UTERUS     ENDARTERECTOMY Left 11/13/2020   Procedure: Evacuation of left neck hematoma;  Surgeon: Katha Cabal, MD;  Location: [facility];  Service: Vascular;  Laterality: Left;   ENDARTERECTOMY Left 11/13/2020   Procedure: ENDARTERECTOMY CAROTID;  Surgeon: Algernon Huxley, MD;  Location: [facility];  Service: Vascular;  Laterality: Left;   TONSILLECTOMY     TUBAL LIGATION     Patient Active Problem List   Diagnosis Date Noted   Carotid stenosis, symptomatic, with infarction ([place]) 11/13/2020   Gout flare 11/10/2020   UTI (urinary tract infection) 11/10/2020   Left carotid artery stenosis 11/10/2020   Chronic kidney disease 11/10/2020   Left basal ganglia embolic stroke ([place]) 34/74/2595   PAC (premature atrial contraction)    Pre-procedural cardiovascular examination    Ischemic stroke ([place]) 10/20/2020   TIA (transient ischemic attack) 10/19/2020   Right hemiparesis ([place]) 10/19/2020   Type 2 diabetes mellitus with stage 3 chronic kidney disease, without long-term current use of insulin ([place]) 08/24/2018   Hyperlipidemia, unspecified 07/19/2017   Hypertension 07/19/2017   PUD (peptic ulcer disease) 07/19/2017   Type 2 diabetes mellitus ([place]) 07/19/2017   Personal history of gout 08/12/2016   Anemia, unspecified 04/09/2016   Hypothyroidism, acquired 12/10/2015    REFERRING DIAG: CVA with  right hemiparesis  THERAPY DIAG:  No diagnosis found.  Rationale for Evaluation and Treatment Rehabilitation  PERTINENT HISTORY: 88 y.o. female with medical history significant for type 2 diabetes mellitus, essential hypertension, acquired hypothyroidism, hyperlipidemia, stage III chronic kidney disease reports increased right sided weakness on 10/19/20. She was diagnosed with left basal ganglia ischemic stroke. She was discharged to inpatient rehab for 10 days and then discharged home. Patient was evaluated by outpatient PT on 11/12/20. CVA was due to carotid stenosis. She underwent left carotid  endartectomy on 7/21. Procedure went well however patient suffered from left cervical hematoma and had subsequent surgical procedure to stop the bleed and remove the hematoma on 11/13/20. They were concerned with her breathing and therefore intubated her for 1 day, she was in ICU for 2 days and transferred to med/surge on 11/15/20. Patient was discharged home on 11/17/20 and is now returning to outpatient PT. She has had the stiches removed and is healing well. Denies any neck pain or stiffness. She lives with her daughter and has 24/7 caregiver support. She is still using RW for most ambulation. She is able to transfer to bedside commode well and is able to stand short time unsupported. She reports feeling very weak and fatigued. She reports she has been dragging her right foot more. She denies any numbness/tingling; She reports feeling like her legs are wanting to do more but is hesitant because of fear of falling. She is mod I for self care morning routine. She does still receive help with showering. She has been working on increasing her activity but denies any formal exercise.   PRECAUTIONS: Fall risk  SUBJECTIVE: Pt notes no new complaints and has not had any falls.  Pt reports continued difficulties with balance and wobbliness but overall she has been doing well.   PAIN:  Are you having pain? No   TODAY'S TREATMENT: 01/28/2022 Gait belt donned, CGA for safety unless otherwise noted  Neuro Re-Ed: - 4WW maximum speed walking 365f, without seated recovery, loaded with 50# in basket on rollator seat  Cues for 4ww proximity and R foot clearance; pt   - Ambulates [id number] gym with [facility] 2.5# AW on RLE to improve foot clearance  - Airex plank  Heel to toe walking with UUE 2 finger support (FWB/BKWD) x4 laps  Lateral stepping with BUE 2 finger support (FWB/BKWD) x4 laps  Marching on Airex plank with 2.5# with BUE 2 finger support, 2x10  - Airex pad  Standing tic-tac-toe at white board  with NBOS and reaching outside cone of stability for increased proprioception    PATIENT EDUCATION: Education details: goal reassessment, indications, plan Person educated: Patient Education method: Explanation, Demonstration, and Verbal cues Education comprehension: verbalized understanding, returned demonstration, and needs further education   HOME EXERCISE PROGRAM: No changes this session;   Instructed patient in pool exercise: Access Code: [email or web address] URL: [email or web address] Date: 10/22/2021 Prepared by: MaBlanche EastExercises - Seated Knee Extension with Resistance  - 1 x daily - 7 x weekly - 2 sets - 15 reps - Forward and Backward Stepping at [employer]- 1 x daily - 7 x weekly - 1 sets - 5 reps - Freestyle Swimming with Snorkel Mask  - 1 x daily - 7 x weekly - 1 sets - 2-3 reps - Heel Walking  - 1 x daily - 7 x weekly - 1 sets - 5 reps - Toe Walking  - 1 x daily - 7 x weekly -  1 sets - 5 reps - Single Leg Stance at Pool Wall  - 1 x daily - 7 x weekly - 1 sets - 3-5 reps - 15-30 sec hold   PT Short Term Goals -       PT SHORT TERM GOAL #1   Title Patient will be adherent to HEP at least 3x a week to improve functional strength and balance for better safety at home.    Baseline 9/21: Pt reports compliance with HEP and is confident, 1/3: doing exercise 2x a week; 06/25/21: 3x/week, 5/18: consistent; 8/1: consistent;    Time 4    Period Weeks    Status Achieved    Target Date 06/25/21      PT SHORT TERM GOAL #2   Title Patient will be independent in transferring sit<>Stand without pushing on arm rests to improve ability to get up from chair.    Baseline 9/21: pt able to complete STS without use of UEs, 1/3:able to stand but has moderate difficulty requiring increased time; 06/25/21: able to perform indep without hands but requiring use of momentum to attain standing. Remains stable. 5/18: able to stand without UE assist, close supervision, remains stable;     Time 4    Period Weeks    Status Achieved    Target Date 07/23/21              PT Long Term Goals -      PT LONG TERM GOAL #1   Title Patient (> 28 years old) will complete five times sit to stand test in < 15 seconds indicating an increased LE strength and improved balance.    Baseline 9/21: 29 seconds hands-free 10/10: deferred 10/12: 34 sec hands free; 11/2: 23.8 sec hands-free; 12/7: 33.2 sec hands free, 1/3: 33.92 hands free; 06/25/21: 25.69 sec, 4/13: 15.5 sec hands free, 5/18: 23 sec without UE 6/22: deferred, 8/1: 15.7 sec without UE; 9/7: 15 sec without UE    Time 8    Period Weeks    Status Partially met   Target Date  02/16/22     PT LONG TERM GOAL #2   Title Patient will increase six minute walk test distance to >400 feet for improve gait ability    Baseline 9/21: 368 ft with RW; 10/10: deferred 10/12: 408 ft; 11/2: 476 ft with 4WW, 1/3: 475 feet with RW; 4/13: 450 feet without AD CGA 10/15/21: 460 feet without AD, CGA   Time 8    Period Weeks    Status Achieved    Target Date 02/16/22     PT LONG TERM GOAL #3   Title Patient will increase 10 meter walk test to >1.56ms as to improve gait speed for better community ambulation and to reduce fall risk.    Baseline 9/21: 0.5 m/s with RW; 10/10 deferred 10/12: 0.93 m/s with 46LS 11/2: 0.83 m/s with 49HT 12/7: 0.52 m/s with RW, 1/3: 0.704; 06/25/21: 0.55 m/s with SPC. 5/18: 0.4 m/s without AD , 10/15/21 0.507 m/s without AD, 8/1: 0.64 m/s; 9/7: 0.42 m/s with SPC, 0.43 m/s without AD   Time 8    Period Weeks    Status On going    Target Date 02/16/22     PT LONG TERM GOAL #4   Title Patient will be independent with ascend/descend 6 steps using single UE in step over step pattern without LOB.    Baseline 9/21:  Patient uses PT stairs (4 steps) with bilateral upper extremity support  on handrails for both ascending/descending.  Patient exhibits reciprocal pattern ascending and step to pattern descending. 10/10: deferred; 10/12:  ascending/descending recip. steps with BUE support; 11/2: Ascended/descended 8 steps total, reciprocal pattern ascending and step-to descending. Pt uses UUE support throughout with CGA assist.; 12/7: Ascended and descended 8 steps total with UUE support and CGA. Reciprocal stepping ascending and step to descending., 1/3: Deferrred; 06/25/21: able to perform with SUE support and reciprocal pattern asc/desc, but does require close CGA with descending. 5/18: independent with 2 handrails, close supervision with 1 handrai; 10/15/21: requires 2 handrails, 8/1: requires 1 handrail, descends one step at a time; 9/7: uses 1 handrail, recip pattern ascending, able to do recip pattern descending but with one instance of LOB where pt self-corrects    Time 8    Period Weeks    Status Partially Met    Target Date 02/16/22     PT LONG TERM GOAL #5   Title Patient will demonstrate an improved Berg Balance Score of >45/56 as to demonstrate improved balance with ADLs such as sitting/standing and transfer balance and reduced fall risk.    Baseline 9/21: deferred d/t time; 9/29: 42/56; 10/10: deferred; 10/12: 44/56; 11/2: deferred; 11/7: 46/56, 1/3: deferred    Time 8    Period Weeks    Status Achieved    Target Date 12/17/21     PT LONG TERM GOAL #6   Title Patient will improve FOTO score to >60% to indicate improved functional mobility with ADLs.    Baseline 9/21: 59; 10/10: 57; 11/2: 60; 12/7: 58%, 1/3: 61%, 4/13: 56% 10/15/21: 58%, 8/1: 56%; 9/7: 67%   Time 8    Period Weeks    Status MET   Target Date 08/24/23w     PT LONG TERM GOAL #7   Title Pt will improve FGA by a minimum of 5 points to indicate clinically significant improvement in reduced risk of falls with walking tasks.    Baseline 06/25/21: 12, 4/13: deferred, 5/18: deferred due to increased ankle discomfort; 6/22: deferred due to elevated BP- will assess next visit; 10/20/21: 8/30, 8/1: 8/30; 9/7: 13/30   Time 8    Period Weeks    Status MET   Target  Date 12/17/21   PT LONG TERM GOAL #8  Title Pt will improve FGA to >18/30 to indicate clinically significant improvement in reduced risk of falls with walking tasks and improved balance.  Baseline 9/7: 13/30  Time 7  Period Weeks   Status NEW  Target Date 02/17/2022            Plan -    Clinical Impression Statement Pt performed well with all tasks today and noted to be able to perform better with foot clearance exercises.  Pt increased weight resistance with pushing the rollator and around the ankle for standing and balance related tasks.  Pt is doing much better with R foot clearance and does not need consistent verbal cuing as she has in past sessions.  Pt will continue to benefit from skilled therapy to address remaining deficits listed above as part of current POC.     Personal Factors and Comorbidities Age    Comorbidities HTN, CKD, fall risk, type 2 diabetes,    Examination-Activity Limitations Lift;Locomotion Level;Squat;Stairs;Stand;Toileting;Transfers;Bathing    Examination-Participation Restrictions Cleaning;Community Activity;Laundry;Meal Prep;Shop;Volunteer;Driving    Stability/Clinical Decision Making Stable/Uncomplicated    Rehab Potential Fair    PT Frequency 2x / week    PT Duration 8 weeks  PT Treatment/Interventions Cryotherapy;Electrical Stimulation;Moist Heat;Gait training;Stair training;Functional mobility training;Therapeutic activities;Therapeutic exercise;Neuromuscular re-education;Balance training;Patient/family education;Orthotic Fit/Training;Energy conservation    PT Next Visit Plan Continue POC, practice with [facility], interval walking training.    PT Home Exercise Plan No changes this date    Consulted and Agree with Plan of Care Patient            Gwenlyn Saran, PT, DPT 01/28/22, 1:50 PM  Physical Therapist - [facility]

## 2022-02-02 ENCOUNTER — Ambulatory Visit: Payer: Medicare HMO | Admitting: Occupational Therapy

## 2022-02-02 ENCOUNTER — Other Ambulatory Visit: Payer: Self-pay | Admitting: Internal Medicine

## 2022-02-02 ENCOUNTER — Ambulatory Visit: Payer: Medicare HMO

## 2022-02-02 DIAGNOSIS — G8191 Hemiplegia, unspecified affecting right dominant side: Secondary | ICD-10-CM

## 2022-02-02 DIAGNOSIS — I639 Cerebral infarction, unspecified: Secondary | ICD-10-CM | POA: Diagnosis not present

## 2022-02-02 DIAGNOSIS — G459 Transient cerebral ischemic attack, unspecified: Secondary | ICD-10-CM

## 2022-02-02 DIAGNOSIS — Z1231 Encounter for screening mammogram for malignant neoplasm of breast: Secondary | ICD-10-CM

## 2022-02-02 DIAGNOSIS — I6522 Occlusion and stenosis of left carotid artery: Secondary | ICD-10-CM

## 2022-02-02 NOTE — Therapy (Signed)
OUTPATIENT PHYSICAL THERAPY TREATMENT NOTE    Patient Name: Dayami Taitt MRN: [id number] DOB:04/23/1929, 88 y.o., female Today's Date: 02/02/2022  PCP: Roetta Sessions MD REFERRING PROVIDER: Roetta Sessions MD   PT End of Session - 02/02/22 1351     Visit Number 107    Number of Visits 125    Date for PT Re-Evaluation 02/16/22    Authorization Type [employer]    Authorization Time Period 12/22/21-02/16/22    Progress Note Due on Visit 110    PT Start Time 1345    PT Stop Time 1428    PT Time Calculation (min) 43 min    Equipment Utilized During Treatment Gait belt    Activity Tolerance Patient tolerated treatment well;No increased pain    Behavior During Therapy WFL for tasks assessed/performed                Past Medical History:  Diagnosis Date   Anemia    Cough    RESOLVING, NO FEVER   Diabetes mellitus without complication ([place])    Gout    Hypertension    Hypothyroidism    PUD (peptic ulcer disease)    Stroke [facility])    Wears dentures    full upper, partial lower   Past Surgical History:  Procedure Laterality Date   AMPUTATION TOE Left 12/14/2017   Procedure: AMPUTATION TOE-4TH MPJ;  Surgeon: Samara Deist, DPM;  Location: [place];  Service: Podiatry;  Laterality: Left;  IVA LOCAL Diabetic - oral meds   APPENDECTOMY     CATARACT EXTRACTION W/PHACO Right 06/23/2015   Procedure: CATARACT EXTRACTION PHACO AND INTRAOCULAR LENS PLACEMENT (IOC);  Surgeon: Estill Cotta, MD;  Location: [facility];  Service: Ophthalmology;  Laterality: Right;  [place] 01:28 AP% 23.4 CDE 36.14 fluid pack lot # [id number]   CATARACT EXTRACTION W/PHACO Left 07/28/2015   Procedure: CATARACT EXTRACTION PHACO AND INTRAOCULAR LENS PLACEMENT (IOC);  Surgeon: Estill Cotta, MD;  Location: [facility];  Service: Ophthalmology;  Laterality: Left;  [place]    1:29.7 AP%  24.9 CDE   41.32 fluid casette lot #[id number] H exp 09/23/2016   COONOSCOPY     AND ENDOSCOPY   DILATION AND CURETTAGE OF  UTERUS     ENDARTERECTOMY Left 11/13/2020   Procedure: Evacuation of left neck hematoma;  Surgeon: Katha Cabal, MD;  Location: [facility];  Service: Vascular;  Laterality: Left;   ENDARTERECTOMY Left 11/13/2020   Procedure: ENDARTERECTOMY CAROTID;  Surgeon: Algernon Huxley, MD;  Location: [facility];  Service: Vascular;  Laterality: Left;   TONSILLECTOMY     TUBAL LIGATION     Patient Active Problem List   Diagnosis Date Noted   Carotid stenosis, symptomatic, with infarction ([place]) 11/13/2020   Gout flare 11/10/2020   UTI (urinary tract infection) 11/10/2020   Left carotid artery stenosis 11/10/2020   Chronic kidney disease 11/10/2020   Left basal ganglia embolic stroke ([place]) 28/41/3244   PAC (premature atrial contraction)    Pre-procedural cardiovascular examination    Ischemic stroke ([place]) 10/20/2020   TIA (transient ischemic attack) 10/19/2020   Right hemiparesis ([place]) 10/19/2020   Type 2 diabetes mellitus with stage 3 chronic kidney disease, without long-term current use of insulin ([place]) 08/24/2018   Hyperlipidemia, unspecified 07/19/2017   Hypertension 07/19/2017   PUD (peptic ulcer disease) 07/19/2017   Type 2 diabetes mellitus ([place]) 07/19/2017   Personal history of gout 08/12/2016   Anemia, unspecified 04/09/2016   Hypothyroidism, acquired 12/10/2015    REFERRING DIAG: CVA  with right hemiparesis  THERAPY DIAG:  Ischemic stroke ([place])  Left basal ganglia embolic stroke ([facility])  Left carotid artery stenosis  Right hemiparesis ([facility])  TIA (transient ischemic attack)  Rationale for Evaluation and Treatment Rehabilitation  PERTINENT HISTORY: 88 y.o. female with medical history significant for type 2 diabetes mellitus, essential hypertension, acquired hypothyroidism, hyperlipidemia, stage III chronic kidney disease reports increased right sided weakness on 10/19/20. She was diagnosed with left basal ganglia ischemic stroke. She was discharged to inpatient rehab for 10 days and  then discharged home. Patient was evaluated by outpatient PT on 11/12/20. CVA was due to carotid stenosis. She underwent left carotid endartectomy on 7/21. Procedure went well however patient suffered from left cervical hematoma and had subsequent surgical procedure to stop the bleed and remove the hematoma on 11/13/20. They were concerned with her breathing and therefore intubated her for 1 day, she was in ICU for 2 days and transferred to med/surge on 11/15/20. Patient was discharged home on 11/17/20 and is now returning to outpatient PT. She has had the stiches removed and is healing well. Denies any neck pain or stiffness. She lives with her daughter and has 24/7 caregiver support. She is still using RW for most ambulation. She is able to transfer to bedside commode well and is able to stand short time unsupported. She reports feeling very weak and fatigued. She reports she has been dragging her right foot more. She denies any numbness/tingling; She reports feeling like her legs are wanting to do more but is hesitant because of fear of falling. She is mod I for self care morning routine. She does still receive help with showering. She has been working on increasing her activity but denies any formal exercise.   PRECAUTIONS: Fall risk  SUBJECTIVE: Pt notes no new complaints and has not had any falls.  Pt reports continued difficulties with balance and wobbliness but overall she has been doing well.   PAIN:  Are you having pain? No   TODAY'S TREATMENT: 02/02/2022 Gait belt donned, CGA-supervision  for safety unless otherwise noted  Neuro Re-Ed: - Ambulates 168f around gym with [facility]and cognitive load; emphasis on internal cueing for foot clearance - Ambulates [id number] gym with WBTrinity Muscatinend 2.5# AW on RLE to improve foot clearance  - Airex plank with 2.5# AW on RLE  Heel to toe walking with UUE 2 finger support (FWB/BKWD) x4 laps  Lateral stepping with BUE 2 finger support (FWB/BKWD) x4  laps  Marching on Airex plank with BUE 2 finger support, 2x10 - orange hurdles - forward/backward stepping with mirror for proprioceptive feedback. 2.5# AW on RLE. - Airex pad  Standing tic-tac-toe at white board with NBOS and reaching outside cone of stability for increased proprioception    PATIENT EDUCATION: Education details: goal reassessment, indications, plan Person educated: Patient Education method: Explanation, Demonstration, and Verbal cues Education comprehension: verbalized understanding, returned demonstration, and needs further education   HOME EXERCISE PROGRAM: No changes this session;   Instructed patient in pool exercise: Access Code: [email or web address] URL: [email or web address] Date: 10/22/2021 Prepared by: MaBlanche EastExercises - Seated Knee Extension with Resistance  - 1 x daily - 7 x weekly - 2 sets - 15 reps - Forward and Backward Stepping at Po[employer]- 1 x daily - 7 x weekly - 1 sets - 5 reps - Freestyle Swimming with Snorkel Mask  - 1 x daily - 7 x weekly - 1 sets - 2-3 reps - Heel Walking  -  1 x daily - 7 x weekly - 1 sets - 5 reps - Toe Walking  - 1 x daily - 7 x weekly - 1 sets - 5 reps - Single Leg Stance at Pool Wall  - 1 x daily - 7 x weekly - 1 sets - 3-5 reps - 15-30 sec hold   PT Short Term Goals -       PT SHORT TERM GOAL #1   Title Patient will be adherent to HEP at least 3x a week to improve functional strength and balance for better safety at home.    Baseline 9/21: Pt reports compliance with HEP and is confident, 1/3: doing exercise 2x a week; 06/25/21: 3x/week, 5/18: consistent; 8/1: consistent;    Time 4    Period Weeks    Status Achieved    Target Date 06/25/21      PT SHORT TERM GOAL #2   Title Patient will be independent in transferring sit<>Stand without pushing on arm rests to improve ability to get up from chair.    Baseline 9/21: pt able to complete STS without use of UEs, 1/3:able to stand but has moderate  difficulty requiring increased time; 06/25/21: able to perform indep without hands but requiring use of momentum to attain standing. Remains stable. 5/18: able to stand without UE assist, close supervision, remains stable;    Time 4    Period Weeks    Status Achieved    Target Date 07/23/21              PT Long Term Goals -      PT LONG TERM GOAL #1   Title Patient (> 34 years old) will complete five times sit to stand test in < 15 seconds indicating an increased LE strength and improved balance.    Baseline 9/21: 29 seconds hands-free 10/10: deferred 10/12: 34 sec hands free; 11/2: 23.8 sec hands-free; 12/7: 33.2 sec hands free, 1/3: 33.92 hands free; 06/25/21: 25.69 sec, 4/13: 15.5 sec hands free, 5/18: 23 sec without UE 6/22: deferred, 8/1: 15.7 sec without UE; 9/7: 15 sec without UE    Time 8    Period Weeks    Status Partially met   Target Date  02/16/22     PT LONG TERM GOAL #2   Title Patient will increase six minute walk test distance to >400 feet for improve gait ability    Baseline 9/21: 368 ft with RW; 10/10: deferred 10/12: 408 ft; 11/2: 476 ft with 4WW, 1/3: 475 feet with RW; 4/13: 450 feet without AD CGA 10/15/21: 460 feet without AD, CGA   Time 8    Period Weeks    Status Achieved    Target Date 02/16/22     PT LONG TERM GOAL #3   Title Patient will increase 10 meter walk test to >1.56ms as to improve gait speed for better community ambulation and to reduce fall risk.    Baseline 9/21: 0.5 m/s with RW; 10/10 deferred 10/12: 0.93 m/s with 47MC 11/2: 0.83 m/s with 49OB 12/7: 0.52 m/s with RW, 1/3: 0.704; 06/25/21: 0.55 m/s with SPC. 5/18: 0.4 m/s without AD , 10/15/21 0.507 m/s without AD, 8/1: 0.64 m/s; 9/7: 0.42 m/s with SPC, 0.43 m/s without AD   Time 8    Period Weeks    Status On going    Target Date 02/16/22     PT LONG TERM GOAL #4   Title Patient will be independent with ascend/descend 6 steps using  single UE in step over step pattern without LOB.    Baseline  9/21:  Patient uses PT stairs (4 steps) with bilateral upper extremity support on handrails for both ascending/descending.  Patient exhibits reciprocal pattern ascending and step to pattern descending. 10/10: deferred; 10/12: ascending/descending recip. steps with BUE support; 11/2: Ascended/descended 8 steps total, reciprocal pattern ascending and step-to descending. Pt uses UUE support throughout with CGA assist.; 12/7: Ascended and descended 8 steps total with UUE support and CGA. Reciprocal stepping ascending and step to descending., 1/3: Deferrred; 06/25/21: able to perform with SUE support and reciprocal pattern asc/desc, but does require close CGA with descending. 5/18: independent with 2 handrails, close supervision with 1 handrai; 10/15/21: requires 2 handrails, 8/1: requires 1 handrail, descends one step at a time; 9/7: uses 1 handrail, recip pattern ascending, able to do recip pattern descending but with one instance of LOB where pt self-corrects    Time 8    Period Weeks    Status Partially Met    Target Date 02/16/22     PT LONG TERM GOAL #5   Title Patient will demonstrate an improved Berg Balance Score of >45/56 as to demonstrate improved balance with ADLs such as sitting/standing and transfer balance and reduced fall risk.    Baseline 9/21: deferred d/t time; 9/29: 42/56; 10/10: deferred; 10/12: 44/56; 11/2: deferred; 11/7: 46/56, 1/3: deferred    Time 8    Period Weeks    Status Achieved    Target Date 12/17/21     PT LONG TERM GOAL #6   Title Patient will improve FOTO score to >60% to indicate improved functional mobility with ADLs.    Baseline 9/21: 59; 10/10: 57; 11/2: 60; 12/7: 58%, 1/3: 61%, 4/13: 56% 10/15/21: 58%, 8/1: 56%; 9/7: 67%   Time 8    Period Weeks    Status MET   Target Date 08/24/23w     PT LONG TERM GOAL #7   Title Pt will improve FGA by a minimum of 5 points to indicate clinically significant improvement in reduced risk of falls with walking tasks.    Baseline  06/25/21: 12, 4/13: deferred, 5/18: deferred due to increased ankle discomfort; 6/22: deferred due to elevated BP- will assess next visit; 10/20/21: 8/30, 8/1: 8/30; 9/7: 13/30   Time 8    Period Weeks    Status MET   Target Date 12/17/21   PT LONG TERM GOAL #8  Title Pt will improve FGA to >18/30 to indicate clinically significant improvement in reduced risk of falls with walking tasks and improved balance.  Baseline 9/7: 13/30  Time 7  Period Weeks   Status NEW  Target Date 02/17/2022            Plan -    Clinical Impression Statement Pt tolerated treatment well today. She continues to present with increased gait deficits (decreased WBQC proximity and decreased RLE clearance) with cognitive tasks. However, she does demonstrate improvements in foot clearance with ankle weight. Treatment continues to address dynamic balance interventions with mirror for proprioceptive feedback. Decreased cueing for correct mechanics noted with repetition. Pt will benefit from interval walking program to address CV endurance. Pt will continue to benefit from skilled OPPT services to address deficits for return to baseline function.     Personal Factors and Comorbidities Age    Comorbidities HTN, CKD, fall risk, type 2 diabetes,    Examination-Activity Limitations Lift;Locomotion Level;Squat;Stairs;Stand;Toileting;Transfers;Bathing    Examination-Participation Restrictions Cleaning;Community Activity;Laundry;Meal Prep;Shop;Volunteer;Driving    Stability/Clinical  Decision Making Stable/Uncomplicated    Rehab Potential Fair    PT Frequency 2x / week    PT Duration 8 weeks    PT Treatment/Interventions Cryotherapy;Electrical Stimulation;Moist Heat;Gait training;Stair training;Functional mobility training;Therapeutic activities;Therapeutic exercise;Neuromuscular re-education;Balance training;Patient/family education;Orthotic Fit/Training;Energy conservation    PT Next Visit Plan Continue POC, practice with  [facility], interval walking training.    PT Home Exercise Plan No changes this date    Consulted and Agree with Plan of Care Patient            Herminio Commons, PT, DPT 3:29 PM,02/02/22 Physical Therapist - [facility]

## 2022-02-04 ENCOUNTER — Ambulatory Visit: Payer: Medicare HMO

## 2022-02-04 ENCOUNTER — Ambulatory Visit: Payer: Medicare HMO | Admitting: Occupational Therapy

## 2022-02-04 DIAGNOSIS — I639 Cerebral infarction, unspecified: Secondary | ICD-10-CM

## 2022-02-04 DIAGNOSIS — R262 Difficulty in walking, not elsewhere classified: Secondary | ICD-10-CM

## 2022-02-04 DIAGNOSIS — G8191 Hemiplegia, unspecified affecting right dominant side: Secondary | ICD-10-CM

## 2022-02-04 DIAGNOSIS — R278 Other lack of coordination: Secondary | ICD-10-CM

## 2022-02-04 DIAGNOSIS — M6281 Muscle weakness (generalized): Secondary | ICD-10-CM

## 2022-02-04 DIAGNOSIS — R482 Apraxia: Secondary | ICD-10-CM

## 2022-02-04 DIAGNOSIS — I6522 Occlusion and stenosis of left carotid artery: Secondary | ICD-10-CM

## 2022-02-04 DIAGNOSIS — G459 Transient cerebral ischemic attack, unspecified: Secondary | ICD-10-CM

## 2022-02-04 DIAGNOSIS — R269 Unspecified abnormalities of gait and mobility: Secondary | ICD-10-CM

## 2022-02-04 DIAGNOSIS — R2681 Unsteadiness on feet: Secondary | ICD-10-CM

## 2022-02-04 NOTE — Therapy (Signed)
OUTPATIENT PHYSICAL THERAPY TREATMENT NOTE    Patient Name: Cassandra Schaefer MRN: [id number] DOB:1928/08/25, 88 y.o., female Today's Date: 02/04/2022  PCP: Roetta Sessions MD REFERRING PROVIDER: Roetta Sessions MD   PT End of Session - 02/04/22 1338     Visit Number 108    Number of Visits 125    Date for PT Re-Evaluation 02/16/22    Authorization Type [employer]    Authorization Time Period 12/22/21-02/16/22    Progress Note Due on Visit 110    PT Start Time 1340    PT Stop Time 1430    PT Time Calculation (min) 50 min    Equipment Utilized During Treatment Gait belt    Activity Tolerance Patient tolerated treatment well;No increased pain    Behavior During Therapy WFL for tasks assessed/performed                 Past Medical History:  Diagnosis Date   Anemia    Cough    RESOLVING, NO FEVER   Diabetes mellitus without complication ([place])    Gout    Hypertension    Hypothyroidism    PUD (peptic ulcer disease)    Stroke [facility])    Wears dentures    full upper, partial lower   Past Surgical History:  Procedure Laterality Date   AMPUTATION TOE Left 12/14/2017   Procedure: AMPUTATION TOE-4TH MPJ;  Surgeon: Samara Deist, DPM;  Location: [facility];  Service: Podiatry;  Laterality: Left;  IVA LOCAL Diabetic - oral meds   APPENDECTOMY     CATARACT EXTRACTION W/PHACO Right 06/23/2015   Procedure: CATARACT EXTRACTION PHACO AND INTRAOCULAR LENS PLACEMENT (IOC);  Surgeon: Estill Cotta, MD;  Location: [facility];  Service: Ophthalmology;  Laterality: Right;  [place] 01:28 AP% 23.4 CDE 36.14 fluid pack lot # [id number]   CATARACT EXTRACTION W/PHACO Left 07/28/2015   Procedure: CATARACT EXTRACTION PHACO AND INTRAOCULAR LENS PLACEMENT (IOC);  Surgeon: Estill Cotta, MD;  Location: [facility];  Service: Ophthalmology;  Laterality: Left;  [place]    1:29.7 AP%  24.9 CDE   41.32 fluid casette lot #[id number] H exp 09/23/2016   COONOSCOPY     AND ENDOSCOPY   DILATION AND CURETTAGE OF  UTERUS     ENDARTERECTOMY Left 11/13/2020   Procedure: Evacuation of left neck hematoma;  Surgeon: Katha Cabal, MD;  Location: [facility];  Service: Vascular;  Laterality: Left;   ENDARTERECTOMY Left 11/13/2020   Procedure: ENDARTERECTOMY CAROTID;  Surgeon: Algernon Huxley, MD;  Location: [facility];  Service: Vascular;  Laterality: Left;   TONSILLECTOMY     TUBAL LIGATION     Patient Active Problem List   Diagnosis Date Noted   Carotid stenosis, symptomatic, with infarction ([place]) 11/13/2020   Gout flare 11/10/2020   UTI (urinary tract infection) 11/10/2020   Left carotid artery stenosis 11/10/2020   Chronic kidney disease 11/10/2020   Left basal ganglia embolic stroke ([place]) 37/01/6268   PAC (premature atrial contraction)    Pre-procedural cardiovascular examination    Ischemic stroke ([place]) 10/20/2020   TIA (transient ischemic attack) 10/19/2020   Right hemiparesis ([place]) 10/19/2020   Type 2 diabetes mellitus with stage 3 chronic kidney disease, without long-term current use of insulin ([place]) 08/24/2018   Hyperlipidemia, unspecified 07/19/2017   Hypertension 07/19/2017   PUD (peptic ulcer disease) 07/19/2017   Type 2 diabetes mellitus ([place]) 07/19/2017   Personal history of gout 08/12/2016   Anemia, unspecified 04/09/2016   Hypothyroidism, acquired 12/10/2015    REFERRING DIAG:  CVA with right hemiparesis  THERAPY DIAG:  Ischemic stroke (HCC)  Left basal ganglia embolic stroke (HCC)  Left carotid artery stenosis  Right hemiparesis (HCC)  TIA (transient ischemic attack)  Other lack of coordination  Unsteadiness on feet  Difficulty in walking, not elsewhere classified  Apraxia  Muscle weakness (generalized)  Abnormality of gait and mobility  Rationale for Evaluation and Treatment Rehabilitation  PERTINENT HISTORY: 88 y.o. female with medical history significant for type 2 diabetes mellitus, essential hypertension, acquired hypothyroidism, hyperlipidemia, stage III  chronic kidney disease reports increased right sided weakness on 10/19/20. She was diagnosed with left basal ganglia ischemic stroke. She was discharged to inpatient rehab for 10 days and then discharged home. Patient was evaluated by outpatient PT on 11/12/20. CVA was due to carotid stenosis. She underwent left carotid endartectomy on 7/21. Procedure went well however patient suffered from left cervical hematoma and had subsequent surgical procedure to stop the bleed and remove the hematoma on 11/13/20. They were concerned with her breathing and therefore intubated her for 1 day, she was in ICU for 2 days and transferred to med/surge on 11/15/20. Patient was discharged home on 11/17/20 and is now returning to outpatient PT. She has had the stiches removed and is healing well. Denies any neck pain or stiffness. She lives with her daughter and has 24/7 caregiver support. She is still using RW for most ambulation. She is able to transfer to bedside commode well and is able to stand short time unsupported. She reports feeling very weak and fatigued. She reports she has been dragging her right foot more. She denies any numbness/tingling; She reports feeling like her legs are wanting to do more but is hesitant because of fear of falling. She is mod I for self care morning routine. She does still receive help with showering. She has been working on increasing her activity but denies any formal exercise.   PRECAUTIONS: Fall risk  SUBJECTIVE: Pt notes no new complaints and has not had any falls.  Pt reports continued difficulties with balance and wobbliness but overall she has been doing well. Continues to be limited with endurance; states when she gets ready in the morning and is on her feet for about 40mn, she is exhausted. Requires 5-173m of rest for recovery.  PAIN:  Are you having pain? No   TODAY'S TREATMENT: 02/04/2022 Gait belt donned, CGA-supervision  for safety unless otherwise noted  Neuro Re-Ed: -  Ambulates [id number] gym with [facility] cognitive load; 2# AW at self selected pace  RPE of 2-4/10 indicating "light activity" - Ambulates [id number] gym with [facility] cognitive load; 2# AW at faster pace  RPE of 4-6/10 indicating "moderate activity" - Ambulates [id number] gym with [facility]QC [facility]; 3# AW at self selected pace  Able to ambulate ~50ft 25fout WBQC, requiring CGA for safety due to increased imbalance. Notable posterior trunk lean to facilitate R hip flexion - Standing marches with green TB resistance (tied to SW), 2x1min -35mated RLE hip ABD with focus on eccentric control, green TB, x10 - Bias STS with RLE under BOS and LLE extended, x5  Increased RLE genu valgum  CGA for safety: - Romberg stance on airex pad, throwing balls at velcro board x2min LU80m Semi-tandem stance on flat surface, throwing balls at velcro board x2min [id number] plank heel to toe walking with UUE 2 finger support (FWB/BKWD)  2 laps with mirror for biofeedback  2 laps looking at PT,  calling out numbers      Not performed today: - Lateral stepping with BUE 2 finger support (FWB/BKWD) x4 laps - Marching on Airex plank with BUE 2 finger support, 2x10 - orange hurdles - forward/backward stepping with mirror for proprioceptive feedback. 2.5# AW on RLE. - Airex pad  Standing tic-tac-toe at white board with NBOS and reaching outside cone of stability for increased proprioception   PATIENT EDUCATION: Education details: goal reassessment, indications, plan Person educated: Patient Education method: Explanation, Demonstration, and Verbal cues Education comprehension: verbalized understanding, returned demonstration, and needs further education   HOME EXERCISE PROGRAM: 02/04/22: seated RLE hip ABD with green TB, emphasis on eccentric control  Instructed patient in pool exercise: Access Code: [place]: [email or web address] Date: 10/22/2021 Prepared by: Blanche East  Exercises - Seated Knee Extension with Resistance  - 1 x daily - 7 x weekly - 2 sets - 15 reps - Forward and Backward Stepping at [employer]  - 1 x daily - 7 x weekly - 1 sets - 5 reps - Freestyle Swimming with Snorkel Mask  - 1 x daily - 7 x weekly - 1 sets - 2-3 reps - Heel Walking  - 1 x daily - 7 x weekly - 1 sets - 5 reps - Toe Walking  - 1 x daily - 7 x weekly - 1 sets - 5 reps - Single Leg Stance at Pool Wall  - 1 x daily - 7 x weekly - 1 sets - 3-5 reps - 15-30 sec hold   PT Short Term Goals -       PT SHORT TERM GOAL #1   Title Patient will be adherent to HEP at least 3x a week to improve functional strength and balance for better safety at home.    Baseline 9/21: Pt reports compliance with HEP and is confident, 1/3: doing exercise 2x a week; 06/25/21: 3x/week, 5/18: consistent; 8/1: consistent;    Time 4    Period Weeks    Status Achieved    Target Date 06/25/21      PT SHORT TERM GOAL #2   Title Patient will be independent in transferring sit<>Stand without pushing on arm rests to improve ability to get up from chair.    Baseline 9/21: pt able to complete STS without use of UEs, 1/3:able to stand but has moderate difficulty requiring increased time; 06/25/21: able to perform indep without hands but requiring use of momentum to attain standing. Remains stable. 5/18: able to stand without UE assist, close supervision, remains stable;    Time 4    Period Weeks    Status Achieved    Target Date 07/23/21              PT Long Term Goals -      PT LONG TERM GOAL #1   Title Patient (> 104 years old) will complete five times sit to stand test in < 15 seconds indicating an increased LE strength and improved balance.    Baseline 9/21: 29 seconds hands-free 10/10: deferred 10/12: 34 sec hands free; 11/2: 23.8 sec hands-free; 12/7: 33.2 sec hands free, 1/3: 33.92 hands free; 06/25/21: 25.69 sec, 4/13: 15.5 sec hands free, 5/18: 23 sec without UE 6/22: deferred, 8/1: 15.7 sec  without UE; 9/7: 15 sec without UE    Time 8    Period Weeks    Status Partially met   Target Date  02/16/22     PT LONG TERM GOAL #2  Title Patient will increase six minute walk test distance to >400 feet for improve gait ability    Baseline 9/21: 368 ft with RW; 10/10: deferred 10/12: 408 ft; 11/2: 476 ft with 4WW, 1/3: 475 feet with RW; 4/13: 450 feet without AD CGA 10/15/21: 460 feet without AD, CGA   Time 8    Period Weeks    Status Achieved    Target Date 02/16/22     PT LONG TERM GOAL #3   Title Patient will increase 10 meter walk test to >1.48ms as to improve gait speed for better community ambulation and to reduce fall risk.    Baseline 9/21: 0.5 m/s with RW; 10/10 deferred 10/12: 0.93 m/s with 49LV 11/2: 0.83 m/s with 47IX 12/7: 0.52 m/s with RW, 1/3: 0.704; 06/25/21: 0.55 m/s with SPC. 5/18: 0.4 m/s without AD , 10/15/21 0.507 m/s without AD, 8/1: 0.64 m/s; 9/7: 0.42 m/s with SPC, 0.43 m/s without AD   Time 8    Period Weeks    Status On going    Target Date 02/16/22     PT LONG TERM GOAL #4   Title Patient will be independent with ascend/descend 6 steps using single UE in step over step pattern without LOB.    Baseline 9/21:  Patient uses PT stairs (4 steps) with bilateral upper extremity support on handrails for both ascending/descending.  Patient exhibits reciprocal pattern ascending and step to pattern descending. 10/10: deferred; 10/12: ascending/descending recip. steps with BUE support; 11/2: Ascended/descended 8 steps total, reciprocal pattern ascending and step-to descending. Pt uses UUE support throughout with CGA assist.; 12/7: Ascended and descended 8 steps total with UUE support and CGA. Reciprocal stepping ascending and step to descending., 1/3: Deferrred; 06/25/21: able to perform with SUE support and reciprocal pattern asc/desc, but does require close CGA with descending. 5/18: independent with 2 handrails, close supervision with 1 handrai; 10/15/21: requires 2 handrails,  8/1: requires 1 handrail, descends one step at a time; 9/7: uses 1 handrail, recip pattern ascending, able to do recip pattern descending but with one instance of LOB where pt self-corrects    Time 8    Period Weeks    Status Partially Met    Target Date 02/16/22     PT LONG TERM GOAL #5   Title Patient will demonstrate an improved Berg Balance Score of >45/56 as to demonstrate improved balance with ADLs such as sitting/standing and transfer balance and reduced fall risk.    Baseline 9/21: deferred d/t time; 9/29: 42/56; 10/10: deferred; 10/12: 44/56; 11/2: deferred; 11/7: 46/56, 1/3: deferred    Time 8    Period Weeks    Status Achieved    Target Date 12/17/21     PT LONG TERM GOAL #6   Title Patient will improve FOTO score to >60% to indicate improved functional mobility with ADLs.    Baseline 9/21: 59; 10/10: 57; 11/2: 60; 12/7: 58%, 1/3: 61%, 4/13: 56% 10/15/21: 58%, 8/1: 56%; 9/7: 67%   Time 8    Period Weeks    Status MET   Target Date 08/24/23w     PT LONG TERM GOAL #7   Title Pt will improve FGA by a minimum of 5 points to indicate clinically significant improvement in reduced risk of falls with walking tasks.    Baseline 06/25/21: 12, 4/13: deferred, 5/18: deferred due to increased ankle discomfort; 6/22: deferred due to elevated BP- will assess next visit; 10/20/21: 8/30, 8/1: 8/30; 9/7: 13/30   Time 8  Period Weeks    Status MET   Target Date 12/17/21   PT LONG TERM GOAL #8  Title Pt will improve FGA to >18/30 to indicate clinically significant improvement in reduced risk of falls with walking tasks and improved balance.  Baseline 9/7: 13/30  Time 7  Period Weeks   Status NEW  Target Date 02/17/2022            Plan -    Clinical Impression Statement Pt tolerated treatment well today. Continues to be limited with CV endurance, and demonstrates increased gait deviations (esp R foot drag) with fatigue and fast cadence. Demonstrates improvements in proprioceptive  awareness with forward/backward walking on the airex plank. Increased difficulty with NBOS and semi-tandem stance on flat and compliant surfaces with cognitive load and UE interventions, demonstrating increased ankle righting reaction strategies.  Significant glute weakness R>L as demonstrated by genu valgum with STS, which could be contributing to decreased standing balance. Pt will continue to benefit from skilled OPPT services to address deficits for return to baseline function.     Personal Factors and Comorbidities Age    Comorbidities HTN, CKD, fall risk, type 2 diabetes,    Examination-Activity Limitations Lift;Locomotion Level;Squat;Stairs;Stand;Toileting;Transfers;Bathing    Examination-Participation Restrictions Cleaning;Community Activity;Laundry;Meal Prep;Shop;Volunteer;Driving    Stability/Clinical Decision Making Stable/Uncomplicated    Rehab Potential Fair    PT Frequency 2x / week    PT Duration 8 weeks    PT Treatment/Interventions Cryotherapy;Electrical Stimulation;Moist Heat;Gait training;Stair training;Functional mobility training;Therapeutic activities;Therapeutic exercise;Neuromuscular re-education;Balance training;Patient/family education;Orthotic Fit/Training;Energy conservation    PT Next Visit Plan Balance reactive strategies, interval walking training, stair training, glute strengthening   PT Home Exercise Plan No changes this date    Consulted and Agree with Plan of Care Patient             Herminio Commons, PT, DPT 4:22 PM,02/04/22 Physical Therapist - [facility]

## 2022-02-09 ENCOUNTER — Ambulatory Visit: Payer: Medicare HMO | Admitting: Occupational Therapy

## 2022-02-09 ENCOUNTER — Ambulatory Visit: Payer: Medicare HMO

## 2022-02-09 DIAGNOSIS — R278 Other lack of coordination: Secondary | ICD-10-CM

## 2022-02-09 DIAGNOSIS — R2681 Unsteadiness on feet: Secondary | ICD-10-CM

## 2022-02-09 DIAGNOSIS — M6281 Muscle weakness (generalized): Secondary | ICD-10-CM

## 2022-02-09 DIAGNOSIS — I639 Cerebral infarction, unspecified: Secondary | ICD-10-CM | POA: Diagnosis not present

## 2022-02-09 DIAGNOSIS — R269 Unspecified abnormalities of gait and mobility: Secondary | ICD-10-CM

## 2022-02-09 DIAGNOSIS — R262 Difficulty in walking, not elsewhere classified: Secondary | ICD-10-CM

## 2022-02-09 DIAGNOSIS — R482 Apraxia: Secondary | ICD-10-CM

## 2022-02-09 NOTE — Therapy (Signed)
OUTPATIENT PHYSICAL THERAPY TREATMENT NOTE    Patient Name: Cassandra Schaefer MRN: [id number] DOB:04-Jun-1928, 88 y.o., female Today's Date: 02/09/2022  PCP: Roetta Sessions MD REFERRING PROVIDER: Roetta Sessions MD   PT End of Session - 02/09/22 1346     Visit Number 109    Number of Visits 125    Date for PT Re-Evaluation 02/16/22    Authorization Type [employer]    Authorization Time Period 12/22/21-02/16/22    Progress Note Due on Visit 110    PT Start Time 1346    PT Stop Time 1430    PT Time Calculation (min) 44 min    Equipment Utilized During Treatment Gait belt    Activity Tolerance Patient tolerated treatment well;No increased pain    Behavior During Therapy WFL for tasks assessed/performed                 Past Medical History:  Diagnosis Date   Anemia    Cough    RESOLVING, NO FEVER   Diabetes mellitus without complication ([place])    Gout    Hypertension    Hypothyroidism    PUD (peptic ulcer disease)    Stroke [facility])    Wears dentures    full upper, partial lower   Past Surgical History:  Procedure Laterality Date   AMPUTATION TOE Left 12/14/2017   Procedure: AMPUTATION TOE-4TH MPJ;  Surgeon: Samara Deist, DPM;  Location: [facility];  Service: Podiatry;  Laterality: Left;  IVA LOCAL Diabetic - oral meds   APPENDECTOMY     CATARACT EXTRACTION W/PHACO Right 06/23/2015   Procedure: CATARACT EXTRACTION PHACO AND INTRAOCULAR LENS PLACEMENT (IOC);  Surgeon: Estill Cotta, MD;  Location: [facility];  Service: Ophthalmology;  Laterality: Right;  [place] 01:28 AP% 23.4 CDE 36.14 fluid pack lot # [id number]   CATARACT EXTRACTION W/PHACO Left 07/28/2015   Procedure: CATARACT EXTRACTION PHACO AND INTRAOCULAR LENS PLACEMENT (IOC);  Surgeon: Estill Cotta, MD;  Location: [facility];  Service: Ophthalmology;  Laterality: Left;  [place]    1:29.7 AP%  24.9 CDE   41.32 fluid casette lot #[id number] H exp 09/23/2016   COONOSCOPY     AND ENDOSCOPY   DILATION AND CURETTAGE OF  UTERUS     ENDARTERECTOMY Left 11/13/2020   Procedure: Evacuation of left neck hematoma;  Surgeon: Katha Cabal, MD;  Location: [facility];  Service: Vascular;  Laterality: Left;   ENDARTERECTOMY Left 11/13/2020   Procedure: ENDARTERECTOMY CAROTID;  Surgeon: Algernon Huxley, MD;  Location: [facility];  Service: Vascular;  Laterality: Left;   TONSILLECTOMY     TUBAL LIGATION     Patient Active Problem List   Diagnosis Date Noted   Carotid stenosis, symptomatic, with infarction ([place]) 11/13/2020   Gout flare 11/10/2020   UTI (urinary tract infection) 11/10/2020   Left carotid artery stenosis 11/10/2020   Chronic kidney disease 11/10/2020   Left basal ganglia embolic stroke ([place]) 95/18/8416   PAC (premature atrial contraction)    Pre-procedural cardiovascular examination    Ischemic stroke ([place]) 10/20/2020   TIA (transient ischemic attack) 10/19/2020   Right hemiparesis ([place]) 10/19/2020   Type 2 diabetes mellitus with stage 3 chronic kidney disease, without long-term current use of insulin ([place]) 08/24/2018   Hyperlipidemia, unspecified 07/19/2017   Hypertension 07/19/2017   PUD (peptic ulcer disease) 07/19/2017   Type 2 diabetes mellitus ([place]) 07/19/2017   Personal history of gout 08/12/2016   Anemia, unspecified 04/09/2016   Hypothyroidism, acquired 12/10/2015    REFERRING DIAG:  CVA with right hemiparesis  THERAPY DIAG:  Difficulty in walking, not elsewhere classified  Muscle weakness (generalized)  Apraxia  Abnormality of gait and mobility  Other lack of coordination  Unsteadiness on feet  Rationale for Evaluation and Treatment Rehabilitation  PERTINENT HISTORY: 88 y.o. female with medical history significant for type 2 diabetes mellitus, essential hypertension, acquired hypothyroidism, hyperlipidemia, stage III chronic kidney disease reports increased right sided weakness on 10/19/20. She was diagnosed with left basal ganglia ischemic stroke. She was discharged to inpatient  rehab for 10 days and then discharged home. Patient was evaluated by outpatient PT on 11/12/20. CVA was due to carotid stenosis. She underwent left carotid endartectomy on 7/21. Procedure went well however patient suffered from left cervical hematoma and had subsequent surgical procedure to stop the bleed and remove the hematoma on 11/13/20. They were concerned with her breathing and therefore intubated her for 1 day, she was in ICU for 2 days and transferred to med/surge on 11/15/20. Patient was discharged home on 11/17/20 and is now returning to outpatient PT. She has had the stiches removed and is healing well. Denies any neck pain or stiffness. She lives with her daughter and has 24/7 caregiver support. She is still using RW for most ambulation. She is able to transfer to bedside commode well and is able to stand short time unsupported. She reports feeling very weak and fatigued. She reports she has been dragging her right foot more. She denies any numbness/tingling; She reports feeling like her legs are wanting to do more but is hesitant because of fear of falling. She is mod I for self care morning routine. She does still receive help with showering. She has been working on increasing her activity but denies any formal exercise.   PRECAUTIONS: Fall risk  SUBJECTIVE: Pt reports no new episodes of LOB or any falls.  Pt still reporting some fatigue when getting ready in the AM.  Pt notes she is able to get up and get ready with brushing of teeth and doing hair and making of the bed before she requires a seated rest break.   PAIN:  Are you having pain? No   TODAY'S TREATMENT: 02/09/2022  Gait belt donned, CGA-supervision  for safety unless otherwise noted  Neuro Re-Ed:  - Amb[facility] 121f [facility] at self selected pace  RPE of 2-4/10 indicating "light activity" - Ambulates 622f in [facility] AW at faster pace - Ambulates 6053f2 in hallway without AD; 2.5# AW -  Ambulates 60 ft in hallway with pursed lip breathing technique due to pt reporting difficulty breathing, no AW donned   TherEx:  Marches in hallway with 2.5# AW donned Lateral side step in hallway, 1 lap each Standing hip extension, 2.5# AW, 2x10 each LE Standing hip abduction, 2.5# AW, 2x10 each LE Standing hamstring curls, 2.5# AW, 2x10 each LE      Not performed today:  - Lateral stepping with BUE 2 finger support (FWB/BKWD) x4 laps - Marching on Airex plank with BUE 2 finger support, 2x10 - orange hurdles - forward/backward stepping with mirror for proprioceptive feedback. 2.5# AW on RLE. - Airex pad  Standing tic-tac-toe at white board with NBOS and reaching outside cone of stability for increased proprioception   PATIENT EDUCATION: Education details: goal reassessment, indications, plan Person educated: Patient Education method: Explanation, Demonstration, and Verbal cues Education comprehension: verbalized understanding, returned demonstration, and needs further education   HOME EXERCISE PROGRAM: 02/04/22:  seated RLE hip ABD with green TB, emphasis on eccentric control  Instructed patient in pool exercise: Access Code: [place]: [email or web address] Date: 10/22/2021 Prepared by: Blanche East  Exercises - Seated Knee Extension with Resistance  - 1 x daily - 7 x weekly - 2 sets - 15 reps - Forward and Backward Stepping at [employer]  - 1 x daily - 7 x weekly - 1 sets - 5 reps - Freestyle Swimming with Snorkel Mask  - 1 x daily - 7 x weekly - 1 sets - 2-3 reps - Heel Walking  - 1 x daily - 7 x weekly - 1 sets - 5 reps - Toe Walking  - 1 x daily - 7 x weekly - 1 sets - 5 reps - Single Leg Stance at Pool Wall  - 1 x daily - 7 x weekly - 1 sets - 3-5 reps - 15-30 sec hold   PT Short Term Goals -       PT SHORT TERM GOAL #1   Title Patient will be adherent to HEP at least 3x a week to improve functional strength and balance for better safety at  home.    Baseline 9/21: Pt reports compliance with HEP and is confident, 1/3: doing exercise 2x a week; 06/25/21: 3x/week, 5/18: consistent; 8/1: consistent;    Time 4    Period Weeks    Status Achieved    Target Date 06/25/21      PT SHORT TERM GOAL #2   Title Patient will be independent in transferring sit<>Stand without pushing on arm rests to improve ability to get up from chair.    Baseline 9/21: pt able to complete STS without use of UEs, 1/3:able to stand but has moderate difficulty requiring increased time; 06/25/21: able to perform indep without hands but requiring use of momentum to attain standing. Remains stable. 5/18: able to stand without UE assist, close supervision, remains stable;    Time 4    Period Weeks    Status Achieved    Target Date 07/23/21              PT Long Term Goals -      PT LONG TERM GOAL #1   Title Patient (> 22 years old) will complete five times sit to stand test in < 15 seconds indicating an increased LE strength and improved balance.    Baseline 9/21: 29 seconds hands-free 10/10: deferred 10/12: 34 sec hands free; 11/2: 23.8 sec hands-free; 12/7: 33.2 sec hands free, 1/3: 33.92 hands free; 06/25/21: 25.69 sec, 4/13: 15.5 sec hands free, 5/18: 23 sec without UE 6/22: deferred, 8/1: 15.7 sec without UE; 9/7: 15 sec without UE    Time 8    Period Weeks    Status Partially met   Target Date  02/16/22     PT LONG TERM GOAL #2   Title Patient will increase six minute walk test distance to >400 feet for improve gait ability    Baseline 9/21: 368 ft with RW; 10/10: deferred 10/12: 408 ft; 11/2: 476 ft with 4WW, 1/3: 475 feet with RW; 4/13: 450 feet without AD CGA 10/15/21: 460 feet without AD, CGA   Time 8    Period Weeks    Status Achieved    Target Date 02/16/22     PT LONG TERM GOAL #3   Title Patient will increase 10 meter walk test to >1.80ms as to improve gait speed for better community ambulation and to reduce  fall risk.    Baseline 9/21: 0.5  m/s with RW; 10/10 deferred 10/12: 0.93 m/s with 5LD; 11/2: 0.83 m/s with 3TT; 12/7: 0.52 m/s with RW, 1/3: 0.704; 06/25/21: 0.55 m/s with SPC. 5/18: 0.4 m/s without AD , 10/15/21 0.507 m/s without AD, 8/1: 0.64 m/s; 9/7: 0.42 m/s with SPC, 0.43 m/s without AD   Time 8    Period Weeks    Status On going    Target Date 02/16/22     PT LONG TERM GOAL #4   Title Patient will be independent with ascend/descend 6 steps using single UE in step over step pattern without LOB.    Baseline 9/21:  Patient uses PT stairs (4 steps) with bilateral upper extremity support on handrails for both ascending/descending.  Patient exhibits reciprocal pattern ascending and step to pattern descending. 10/10: deferred; 10/12: ascending/descending recip. steps with BUE support; 11/2: Ascended/descended 8 steps total, reciprocal pattern ascending and step-to descending. Pt uses UUE support throughout with CGA assist.; 12/7: Ascended and descended 8 steps total with UUE support and CGA. Reciprocal stepping ascending and step to descending., 1/3: Deferrred; 06/25/21: able to perform with SUE support and reciprocal pattern asc/desc, but does require close CGA with descending. 5/18: independent with 2 handrails, close supervision with 1 handrai; 10/15/21: requires 2 handrails, 8/1: requires 1 handrail, descends one step at a time; 9/7: uses 1 handrail, recip pattern ascending, able to do recip pattern descending but with one instance of LOB where pt self-corrects    Time 8    Period Weeks    Status Partially Met    Target Date 02/16/22     PT LONG TERM GOAL #5   Title Patient will demonstrate an improved Berg Balance Score of >45/56 as to demonstrate improved balance with ADLs such as sitting/standing and transfer balance and reduced fall risk.    Baseline 9/21: deferred d/t time; 9/29: 42/56; 10/10: deferred; 10/12: 44/56; 11/2: deferred; 11/7: 46/56, 1/3: deferred    Time 8    Period Weeks    Status Achieved    Target Date  12/17/21     PT LONG TERM GOAL #6   Title Patient will improve FOTO score to >60% to indicate improved functional mobility with ADLs.    Baseline 9/21: 59; 10/10: 57; 11/2: 60; 12/7: 58%, 1/3: 61%, 4/13: 56% 10/15/21: 58%, 8/1: 56%; 9/7: 67%   Time 8    Period Weeks    Status MET   Target Date 08/24/23w     PT LONG TERM GOAL #7   Title Pt will improve FGA by a minimum of 5 points to indicate clinically significant improvement in reduced risk of falls with walking tasks.    Baseline 06/25/21: 12, 4/13: deferred, 5/18: deferred due to increased ankle discomfort; 6/22: deferred due to elevated BP- will assess next visit; 10/20/21: 8/30, 8/1: 8/30; 9/7: 13/30   Time 8    Period Weeks    Status MET   Target Date 12/17/21   PT LONG TERM GOAL #8  Title Pt will improve FGA to >18/30 to indicate clinically significant improvement in reduced risk of falls with walking tasks and improved balance.  Baseline 9/7: 13/30  Time 7  Period Weeks   Status NEW  Target Date 02/17/2022            Plan -    Clinical Impression Statement Pt tolerated increased NMR today with increased mobility necessary for proper ambulation.  Pt continues to perform better with each subsequent  visit especially without any AD.  Pt does still have some complications with endurance and breathing and was encouraged to perform pursed lip breathing when having difficulty with breathing during exercises.  Pt understood and educated on importance of practicing at home in order to become more familiar with technique.   Pt will continue to benefit from skilled therapy to address remaining deficits in order to improve overall QoL and return to PLOF.       Personal Factors and Comorbidities Age    Comorbidities HTN, CKD, fall risk, type 2 diabetes,    Examination-Activity Limitations Lift;Locomotion Level;Squat;Stairs;Stand;Toileting;Transfers;Bathing    Examination-Participation Restrictions Cleaning;Community Activity;Laundry;Meal  Prep;Shop;Volunteer;Driving    Stability/Clinical Decision Making Stable/Uncomplicated    Rehab Potential Fair    PT Frequency 2x / week    PT Duration 8 weeks    PT Treatment/Interventions Cryotherapy;Electrical Stimulation;Moist Heat;Gait training;Stair training;Functional mobility training;Therapeutic activities;Therapeutic exercise;Neuromuscular re-education;Balance training;Patient/family education;Orthotic Fit/Training;Energy conservation    PT Next Visit Plan Balance reactive strategies, interval walking training, stair training, glute strengthening   PT Home Exercise Plan No changes this date    Consulted and Agree with Plan of Care Patient             Gwenlyn Saran, PT, DPT 02/09/22, 5:14 PM Physical Therapist - [facility]

## 2022-02-11 ENCOUNTER — Ambulatory Visit: Payer: Medicare HMO | Admitting: Occupational Therapy

## 2022-02-11 ENCOUNTER — Ambulatory Visit: Payer: Medicare HMO

## 2022-02-11 DIAGNOSIS — M6281 Muscle weakness (generalized): Secondary | ICD-10-CM

## 2022-02-11 DIAGNOSIS — R262 Difficulty in walking, not elsewhere classified: Secondary | ICD-10-CM

## 2022-02-11 DIAGNOSIS — R2681 Unsteadiness on feet: Secondary | ICD-10-CM

## 2022-02-11 DIAGNOSIS — R269 Unspecified abnormalities of gait and mobility: Secondary | ICD-10-CM

## 2022-02-11 DIAGNOSIS — R278 Other lack of coordination: Secondary | ICD-10-CM

## 2022-02-11 DIAGNOSIS — R482 Apraxia: Secondary | ICD-10-CM

## 2022-02-11 DIAGNOSIS — I639 Cerebral infarction, unspecified: Secondary | ICD-10-CM | POA: Diagnosis not present

## 2022-02-11 NOTE — Therapy (Addendum)
OUTPATIENT PHYSICAL THERAPY TREATMENT NOTE/PHYSICAL THERAPY PROGRESS NOTE   Dates of reporting period  12/22/21   to   02/16/22     Patient Name: Cassandra Schaefer MRN: [id number] DOB:Jan 01, 1929, 88 y.o., female Today's Date: 02/11/2022  PCP: Roetta Sessions MD REFERRING PROVIDER: Roetta Sessions MD   PT End of Session - 02/11/22 1350     Visit Number 110    Number of Visits 125    Date for PT Re-Evaluation 02/16/22    Authorization Type [employer]    Authorization Time Period 12/22/21-02/16/22    Progress Note Due on Visit 110    PT Start Time 6389    PT Stop Time 1430    PT Time Calculation (min) 43 min    Equipment Utilized During Treatment Gait belt    Activity Tolerance Patient tolerated treatment well;No increased pain    Behavior During Therapy WFL for tasks assessed/performed                 Past Medical History:  Diagnosis Date   Anemia    Cough    RESOLVING, NO FEVER   Diabetes mellitus without complication ([place])    Gout    Hypertension    Hypothyroidism    PUD (peptic ulcer disease)    Stroke [facility])    Wears dentures    full upper, partial lower   Past Surgical History:  Procedure Laterality Date   AMPUTATION TOE Left 12/14/2017   Procedure: AMPUTATION TOE-4TH MPJ;  Surgeon: Samara Deist, DPM;  Location: [place];  Service: Podiatry;  Laterality: Left;  IVA LOCAL Diabetic - oral meds   APPENDECTOMY     CATARACT EXTRACTION W/PHACO Right 06/23/2015   Procedure: CATARACT EXTRACTION PHACO AND INTRAOCULAR LENS PLACEMENT (IOC);  Surgeon: Estill Cotta, MD;  Location: [facility];  Service: Ophthalmology;  Laterality: Right;  [place] 01:28 AP% 23.4 CDE 36.14 fluid pack lot # [id number]   CATARACT EXTRACTION W/PHACO Left 07/28/2015   Procedure: CATARACT EXTRACTION PHACO AND INTRAOCULAR LENS PLACEMENT (IOC);  Surgeon: Estill Cotta, MD;  Location: [facility];  Service: Ophthalmology;  Laterality: Left;  [place]    1:29.7 AP%  24.9 CDE   41.32 fluid casette  lot #[id number] H exp 09/23/2016   COONOSCOPY     AND ENDOSCOPY   DILATION AND CURETTAGE OF UTERUS     ENDARTERECTOMY Left 11/13/2020   Procedure: Evacuation of left neck hematoma;  Surgeon: Katha Cabal, MD;  Location: [facility];  Service: Vascular;  Laterality: Left;   ENDARTERECTOMY Left 11/13/2020   Procedure: ENDARTERECTOMY CAROTID;  Surgeon: Algernon Huxley, MD;  Location: [facility];  Service: Vascular;  Laterality: Left;   TONSILLECTOMY     TUBAL LIGATION     Patient Active Problem List   Diagnosis Date Noted   Carotid stenosis, symptomatic, with infarction ([place]) 11/13/2020   Gout flare 11/10/2020   UTI (urinary tract infection) 11/10/2020   Left carotid artery stenosis 11/10/2020   Chronic kidney disease 11/10/2020   Left basal ganglia embolic stroke ([place]) 26/20/3559   PAC (premature atrial contraction)    Pre-procedural cardiovascular examination    Ischemic stroke ([place]) 10/20/2020   TIA (transient ischemic attack) 10/19/2020   Right hemiparesis ([place]) 10/19/2020   Type 2 diabetes mellitus with stage 3 chronic kidney disease, without long-term current use of insulin ([place]) 08/24/2018   Hyperlipidemia, unspecified 07/19/2017   Hypertension 07/19/2017   PUD (peptic ulcer disease) 07/19/2017   Type 2 diabetes mellitus ([place]) 07/19/2017   Personal history  of gout 08/12/2016   Anemia, unspecified 04/09/2016   Hypothyroidism, acquired 12/10/2015    REFERRING DIAG: CVA with right hemiparesis  THERAPY DIAG:  Difficulty in walking, not elsewhere classified  Muscle weakness (generalized)  Apraxia  Abnormality of gait and mobility  Other lack of coordination  Unsteadiness on feet  Rationale for Evaluation and Treatment Rehabilitation  PERTINENT HISTORY: 88 y.o. female with medical history significant for type 2 diabetes mellitus, essential hypertension, acquired hypothyroidism, hyperlipidemia, stage III chronic kidney disease reports increased right sided weakness on 10/19/20.  She was diagnosed with left basal ganglia ischemic stroke. She was discharged to inpatient rehab for 10 days and then discharged home. Patient was evaluated by outpatient PT on 11/12/20. CVA was due to carotid stenosis. She underwent left carotid endartectomy on 7/21. Procedure went well however patient suffered from left cervical hematoma and had subsequent surgical procedure to stop the bleed and remove the hematoma on 11/13/20. They were concerned with her breathing and therefore intubated her for 1 day, she was in ICU for 2 days and transferred to med/surge on 11/15/20. Patient was discharged home on 11/17/20 and is now returning to outpatient PT. She has had the stiches removed and is healing well. Denies any neck pain or stiffness. She lives with her daughter and has 24/7 caregiver support. She is still using RW for most ambulation. She is able to transfer to bedside commode well and is able to stand short time unsupported. She reports feeling very weak and fatigued. She reports she has been dragging her right foot more. She denies any numbness/tingling; She reports feeling like her legs are wanting to do more but is hesitant because of fear of falling. She is mod I for self care morning routine. She does still receive help with showering. She has been working on increasing her activity but denies any formal exercise.   PRECAUTIONS: Fall risk  SUBJECTIVE: Pt reports no new episodes of LOB or any falls.  Pt still reporting some fatigue when getting ready in the AM.  Pt notes she is able to get up and get ready with brushing of teeth and doing hair and making of the bed before she requires a seated rest break.   PAIN:  Are you having pain? No   TODAY'S TREATMENT: 02/11/2022  Gait belt donned, CGA-supervision  for safety unless otherwise noted  Therapeutic Activities:  Goal assessment performed as noted below.  Pt progressing with certain goals, and regressed in others.  Will continue to monitor  and have discussion next visit for re-certification and potentially d/c as therapy progresses.     PATIENT EDUCATION: Education details: goal reassessment, indications, plan Person educated: Patient Education method: Explanation, Demonstration, and Verbal cues Education comprehension: verbalized understanding, returned demonstration, and needs further education   HOME EXERCISE PROGRAM: 02/04/22: seated RLE hip ABD with green TB, emphasis on eccentric control  Instructed patient in pool exercise: Access Code: [email or web address]: [email or web address] Date: 10/22/2021 Prepared by: Blanche East  Exercises - Seated Knee Extension with Resistance  - 1 x daily - 7 x weekly - 2 sets - 15 reps - Forward and Backward Stepping at [employer]  - 1 x daily - 7 x weekly - 1 sets - 5 reps - Freestyle Swimming with Snorkel Mask  - 1 x daily - 7 x weekly - 1 sets - 2-3 reps - Heel Walking  - 1 x daily - 7 x weekly - 1 sets - 5 reps - Toe Walking  -  1 x daily - 7 x weekly - 1 sets - 5 reps - Single Leg Stance at Pool Wall  - 1 x daily - 7 x weekly - 1 sets - 3-5 reps - 15-30 sec hold   PT Short Term Goals -       PT SHORT TERM GOAL #1   Title Patient will be adherent to HEP at least 3x a week to improve functional strength and balance for better safety at home.    Baseline 9/21: Pt reports compliance with HEP and is confident, 1/3: doing exercise 2x a week; 06/25/21: 3x/week, 5/18: consistent; 8/1: consistent;    Time 4    Period Weeks    Status Achieved    Target Date 06/25/21      PT SHORT TERM GOAL #2   Title Patient will be independent in transferring sit<>Stand without pushing on arm rests to improve ability to get up from chair.    Baseline 9/21: pt able to complete STS without use of UEs, 1/3:able to stand but has moderate difficulty requiring increased time; 06/25/21: able to perform indep without hands but requiring use of momentum to attain standing. Remains stable. 5/18: able  to stand without UE assist, close supervision, remains stable;    Time 4    Period Weeks    Status Achieved    Target Date 07/23/21              PT Long Term Goals -      PT LONG TERM GOAL #1   Title Patient (> 62 years old) will complete five times sit to stand test in < 15 seconds indicating an increased LE strength and improved balance.    Baseline 9/21: 29 seconds hands-free 10/10: deferred 10/12: 34 sec hands free; 11/2: 23.8 sec hands-free; 12/7: 33.2 sec hands free, 1/3: 33.92 hands free; 06/25/21: 25.69 sec, 4/13: 15.5 sec hands free, 5/18: 23 sec without UE 6/22: deferred, 8/1: 15.7 sec without UE; 9/7: 15 sec without UE; 10/19: 23.60 without UE   Time 8    Period Weeks    Status Partially met   Target Date  02/16/22     PT LONG TERM GOAL #2   Title Patient will increase six minute walk test distance to >400 feet for improve gait ability    Baseline 9/21: 368 ft with RW; 10/10: deferred 10/12: 408 ft; 11/2: 476 ft with 4WW, 1/3: 475 feet with RW; 4/13: 450 feet without AD CGA 10/15/21: 460 feet without AD, CGA   Time 8    Period Weeks    Status Achieved    Target Date 02/16/22     PT LONG TERM GOAL #3   Title Patient will increase 10 meter walk test to >1.48m/s as to improve gait speed for better community ambulation and to reduce fall risk.    Baseline 9/21: 0.5 m/s with RW; 10/10 deferred 10/12: 0.93 m/s with 1DV; 11/2: 0.83 m/s with 7OH; 12/7: 0.52 m/s with RW, 1/3: 0.704; 06/25/21: 0.55 m/s with SPC. 5/18: 0.4 m/s without AD , 10/15/21 0.507 m/s without AD, 8/1: 0.64 m/s; 9/7: 0.42 m/s with SPC, 0.43 m/s without AD; 10/19: 0.47 m/s without AD   Time 8    Period Weeks    Status On going    Target Date 04/08/2022     PT LONG TERM GOAL #4   Title Patient will be independent with ascend/descend 6 steps using single UE in step over step pattern without LOB.  Baseline 9/21:  Patient uses PT stairs (4 steps) with bilateral upper extremity support on handrails for both  ascending/descending.  Patient exhibits reciprocal pattern ascending and step to pattern descending. 10/10: deferred; 10/12: ascending/descending recip. steps with BUE support; 11/2: Ascended/descended 8 steps total, reciprocal pattern ascending and step-to descending. Pt uses UUE support throughout with CGA assist.; 12/7: Ascended and descended 8 steps total with UUE support and CGA. Reciprocal stepping ascending and step to descending., 1/3: Deferrred; 06/25/21: able to perform with SUE support and reciprocal pattern asc/desc, but does require close CGA with descending. 5/18: independent with 2 handrails, close supervision with 1 handrai; 10/15/21: requires 2 handrails, 8/1: requires 1 handrail, descends one step at a time; 9/7: uses 1 handrail, recip pattern ascending, able to do recip pattern descending but with one instance of LOB where pt self-corrects; 02/11/22: uses 1 handrail, recip pattern ascending, able to do recip pattern descending but feels less comfortable/stable performing with alternating when descending.   Time 8    Period Weeks    Status MET   Target Date 02/16/22     PT LONG TERM GOAL #5   Title Patient will demonstrate an improved Berg Balance Score of >45/56 as to demonstrate improved balance with ADLs such as sitting/standing and transfer balance and reduced fall risk.    Baseline 9/21: deferred d/t time; 9/29: 42/56; 10/10: deferred; 10/12: 44/56; 11/2: deferred; 11/7: 46/56, 1/3: deferred    Time 8    Period Weeks    Status MET    Target Date 12/17/21     PT LONG TERM GOAL #6   Title Patient will improve FOTO score to >60% to indicate improved functional mobility with ADLs.    Baseline 9/21: 59; 10/10: 57; 11/2: 60; 12/7: 58%, 1/3: 61%, 4/13: 56% 10/15/21: 58%, 8/1: 56%; 9/7: 67%; 02/11/22: 58%   Time 8    Period Weeks    Status MET   Target Date 12/17/21     PT LONG TERM GOAL #7   Title Pt will improve FGA by a minimum of 5 points to indicate clinically significant  improvement in reduced risk of falls with walking tasks.    Baseline 06/25/21: 12, 4/13: deferred, 5/18: deferred due to increased ankle discomfort; 6/22: deferred due to elevated BP- will assess next visit; 10/20/21: 8/30, 8/1: 8/30; 9/7: 13/30   Time 8    Period Weeks    Status MET   Target Date 12/17/21   PT LONG TERM GOAL #8  Title Pt will improve FGA to >18/30 to indicate clinically significant improvement in reduced risk of falls with walking tasks and improved balance.  Baseline 9/7: 13/30; 10/19: 12/30  Time 7  Period Weeks   Status ONGOING  Target Date 04/08/2022            Plan -    Clinical Impression Statement Pt able to perform goal assessment well and noted above.  Pt has made progress in areas such as the FGA and the 10 meter walk test, but has regressed in other areas, such as the FOTO and 5xSTS.  Pt educated on re-certification that will come at next visit and the goals that are used to address d/c options.  Will plan to discuss those options with pt at next appointment.   Pt will continue to benefit from skilled therapy to address remaining deficits in order to improve overall QoL and return to PLOF.        Personal Factors and Comorbidities Age  Comorbidities HTN, CKD, fall risk, type 2 diabetes,    Examination-Activity Limitations Lift;Locomotion Level;Squat;Stairs;Stand;Toileting;Transfers;Bathing    Examination-Participation Restrictions Cleaning;Community Activity;Laundry;Meal Prep;Shop;Volunteer;Driving    Stability/Clinical Decision Making Stable/Uncomplicated    Rehab Potential Fair    PT Frequency 2x / week    PT Duration 8 weeks    PT Treatment/Interventions Cryotherapy;Electrical Stimulation;Moist Heat;Gait training;Stair training;Functional mobility training;Therapeutic activities;Therapeutic exercise;Neuromuscular re-education;Balance training;Patient/family education;Orthotic Fit/Training;Energy conservation    PT Next Visit Plan Balance reactive  strategies, interval walking training, stair training, glute strengthening   PT Home Exercise Plan No changes this date    Consulted and Agree with Plan of Care Patient             Gwenlyn Saran, PT, DPT 02/11/22, 5:46 PM Physical Therapist - [facility]

## 2022-02-16 ENCOUNTER — Ambulatory Visit: Payer: Medicare HMO | Admitting: Occupational Therapy

## 2022-02-16 ENCOUNTER — Ambulatory Visit: Payer: Medicare HMO

## 2022-02-16 DIAGNOSIS — R262 Difficulty in walking, not elsewhere classified: Secondary | ICD-10-CM

## 2022-02-16 DIAGNOSIS — R482 Apraxia: Secondary | ICD-10-CM

## 2022-02-16 DIAGNOSIS — R269 Unspecified abnormalities of gait and mobility: Secondary | ICD-10-CM

## 2022-02-16 DIAGNOSIS — M6281 Muscle weakness (generalized): Secondary | ICD-10-CM

## 2022-02-16 DIAGNOSIS — R278 Other lack of coordination: Secondary | ICD-10-CM

## 2022-02-16 DIAGNOSIS — R2681 Unsteadiness on feet: Secondary | ICD-10-CM

## 2022-02-16 DIAGNOSIS — I639 Cerebral infarction, unspecified: Secondary | ICD-10-CM | POA: Diagnosis not present

## 2022-02-16 NOTE — Therapy (Signed)
OUTPATIENT PHYSICAL THERAPY TREATMENT NOTE/RE-CERT    Patient Name: Cassandra Schaefer MRN: [id number] DOB:1928-06-30, 88 y.o., female Today's Date: 02/16/2022  PCP: Roetta Sessions MD REFERRING PROVIDER: Roetta Sessions MD   PT End of Session - 02/16/22 1348     Visit Number 111    Number of Visits 125    Date for PT Re-Evaluation 05/11/22    Authorization Type [employer]    Authorization Time Period 02/16/22-05/11/22    Progress Note Due on Visit 120    PT Start Time 1346    PT Stop Time 1430    PT Time Calculation (min) 44 min    Equipment Utilized During Treatment Gait belt    Activity Tolerance Patient tolerated treatment well;No increased pain    Behavior During Therapy WFL for tasks assessed/performed                 Past Medical History:  Diagnosis Date   Anemia    Cough    RESOLVING, NO FEVER   Diabetes mellitus without complication ([place])    Gout    Hypertension    Hypothyroidism    PUD (peptic ulcer disease)    Stroke [facility])    Wears dentures    full upper, partial lower   Past Surgical History:  Procedure Laterality Date   AMPUTATION TOE Left 12/14/2017   Procedure: AMPUTATION TOE-4TH MPJ;  Surgeon: Samara Deist, DPM;  Location: [place];  Service: Podiatry;  Laterality: Left;  IVA LOCAL Diabetic - oral meds   APPENDECTOMY     CATARACT EXTRACTION W/PHACO Right 06/23/2015   Procedure: CATARACT EXTRACTION PHACO AND INTRAOCULAR LENS PLACEMENT (IOC);  Surgeon: Estill Cotta, MD;  Location: [facility];  Service: Ophthalmology;  Laterality: Right;  [place] 01:28 AP% 23.4 CDE 36.14 fluid pack lot # [id number]   CATARACT EXTRACTION W/PHACO Left 07/28/2015   Procedure: CATARACT EXTRACTION PHACO AND INTRAOCULAR LENS PLACEMENT (IOC);  Surgeon: Estill Cotta, MD;  Location: [facility];  Service: Ophthalmology;  Laterality: Left;  [place]    1:29.7 AP%  24.9 CDE   41.32 fluid casette lot #[id number] H exp 09/23/2016   COONOSCOPY     AND ENDOSCOPY   DILATION AND  CURETTAGE OF UTERUS     ENDARTERECTOMY Left 11/13/2020   Procedure: Evacuation of left neck hematoma;  Surgeon: Katha Cabal, MD;  Location: [facility];  Service: Vascular;  Laterality: Left;   ENDARTERECTOMY Left 11/13/2020   Procedure: ENDARTERECTOMY CAROTID;  Surgeon: Algernon Huxley, MD;  Location: [facility];  Service: Vascular;  Laterality: Left;   TONSILLECTOMY     TUBAL LIGATION     Patient Active Problem List   Diagnosis Date Noted   Carotid stenosis, symptomatic, with infarction ([place]) 11/13/2020   Gout flare 11/10/2020   UTI (urinary tract infection) 11/10/2020   Left carotid artery stenosis 11/10/2020   Chronic kidney disease 11/10/2020   Left basal ganglia embolic stroke ([place]) 63/87/5643   PAC (premature atrial contraction)    Pre-procedural cardiovascular examination    Ischemic stroke ([place]) 10/20/2020   TIA (transient ischemic attack) 10/19/2020   Right hemiparesis ([place]) 10/19/2020   Type 2 diabetes mellitus with stage 3 chronic kidney disease, without long-term current use of insulin ([place]) 08/24/2018   Hyperlipidemia, unspecified 07/19/2017   Hypertension 07/19/2017   PUD (peptic ulcer disease) 07/19/2017   Type 2 diabetes mellitus ([place]) 07/19/2017   Personal history of gout 08/12/2016   Anemia, unspecified 04/09/2016   Hypothyroidism, acquired 12/10/2015    REFERRING DIAG:  CVA with right hemiparesis  THERAPY DIAG:  Difficulty in walking, not elsewhere classified  Muscle weakness (generalized)  Apraxia  Abnormality of gait and mobility  Unsteadiness on feet  Other lack of coordination  Rationale for Evaluation and Treatment Rehabilitation  PERTINENT HISTORY: 88 y.o. female with medical history significant for type 2 diabetes mellitus, essential hypertension, acquired hypothyroidism, hyperlipidemia, stage III chronic kidney disease reports increased right sided weakness on 10/19/20. She was diagnosed with left basal ganglia ischemic stroke. She was discharged  to inpatient rehab for 10 days and then discharged home. Patient was evaluated by outpatient PT on 11/12/20. CVA was due to carotid stenosis. She underwent left carotid endartectomy on 7/21. Procedure went well however patient suffered from left cervical hematoma and had subsequent surgical procedure to stop the bleed and remove the hematoma on 11/13/20. They were concerned with her breathing and therefore intubated her for 1 day, she was in ICU for 2 days and transferred to med/surge on 11/15/20. Patient was discharged home on 11/17/20 and is now returning to outpatient PT. She has had the stiches removed and is healing well. Denies any neck pain or stiffness. She lives with her daughter and has 24/7 caregiver support. She is still using RW for most ambulation. She is able to transfer to bedside commode well and is able to stand short time unsupported. She reports feeling very weak and fatigued. She reports she has been dragging her right foot more. She denies any numbness/tingling; She reports feeling like her legs are wanting to do more but is hesitant because of fear of falling. She is mod I for self care morning routine. She does still receive help with showering. She has been working on increasing her activity but denies any formal exercise.   PRECAUTIONS: Fall risk  SUBJECTIVE: Pt reports she is feeling good today with no pain.  Pt denies any stumbles or falls since last visit.   PAIN:  Are you having pain? No   TODAY'S TREATMENT: 02/16/2022  Gait belt donned, CGA-supervision  for safety unless otherwise noted  Neuro Re-Ed:  Ambulation 334f around gym without AD, CGA utilized for safety Static stance on inclined surface, 30 sec x multiple bouts Dynamic stance on inclined surface with spelling of words using cross body reaching, x3 minutes Ambulation across red mat to simulate unstable surface without any AD   TherEx:  Stair climbing with single UE support, 4 steps x4,  Heel raises, 2x15  at first step on stairs Calf stretches at wall surface, 30 sec bouts, pt with increased difficulty performing so switched to inclined surface Calf stretches on inclined surface, 30 sec bouts     PATIENT EDUCATION: Education details: goal reassessment, indications, plan Person educated: Patient Education method: Explanation, Demonstration, and Verbal cues Education comprehension: verbalized understanding, returned demonstration, and needs further education   HOME EXERCISE PROGRAM: 02/04/22: seated RLE hip ABD with green TB, emphasis on eccentric control  Instructed patient in pool exercise: Access Code: [email or web address] Date: 10/22/2021 Prepared by: MBlanche East Exercises - Seated Knee Extension with Resistance  - 1 x daily - 7 x weekly - 2 sets - 15 reps - Forward and Backward Stepping at [employer] - 1 x daily - 7 x weekly - 1 sets - 5 reps - Freestyle Swimming with Snorkel Mask  - 1 x daily - 7 x weekly - 1 sets - 2-3 reps - Heel Walking  - 1 x daily - 7 x weekly - 1  sets - 5 reps - Toe Walking  - 1 x daily - 7 x weekly - 1 sets - 5 reps - Single Leg Stance at Pool Wall  - 1 x daily - 7 x weekly - 1 sets - 3-5 reps - 15-30 sec hold   PT Short Term Goals -       PT SHORT TERM GOAL #1   Title Patient will be adherent to HEP at least 3x a week to improve functional strength and balance for better safety at home.    Baseline 9/21: Pt reports compliance with HEP and is confident, 1/3: doing exercise 2x a week; 06/25/21: 3x/week, 5/18: consistent; 8/1: consistent;    Time 4    Period Weeks    Status Achieved    Target Date 06/25/21      PT SHORT TERM GOAL #2   Title Patient will be independent in transferring sit<>Stand without pushing on arm rests to improve ability to get up from chair.    Baseline 9/21: pt able to complete STS without use of UEs, 1/3:able to stand but has moderate difficulty requiring increased time; 06/25/21: able to  perform indep without hands but requiring use of momentum to attain standing. Remains stable. 5/18: able to stand without UE assist, close supervision, remains stable;    Time 4    Period Weeks    Status Achieved    Target Date 07/23/21              PT Long Term Goals -      PT LONG TERM GOAL #1   Title Patient (> 68 years old) will complete five times sit to stand test in < 15 seconds indicating an increased LE strength and improved balance.    Baseline 9/21: 29 seconds hands-free 10/10: deferred 10/12: 34 sec hands free; 11/2: 23.8 sec hands-free; 12/7: 33.2 sec hands free, 1/3: 33.92 hands free; 06/25/21: 25.69 sec, 4/13: 15.5 sec hands free, 5/18: 23 sec without UE 6/22: deferred, 8/1: 15.7 sec without UE; 9/7: 15 sec without UE    Time 8    Period Weeks    Status Partially met   Target Date  05/11/2022     PT LONG TERM GOAL #2   Title Patient will increase six minute walk test distance to >400 feet for improve gait ability    Baseline 9/21: 368 ft with RW; 10/10: deferred 10/12: 408 ft; 11/2: 476 ft with 4WW, 1/3: 475 feet with RW; 4/13: 450 feet without AD CGA 10/15/21: 460 feet without AD, CGA   Time 8    Period Weeks    Status Achieved    Target Date 02/16/22     PT LONG TERM GOAL #3   Title Patient will increase 10 meter walk test to >1.42ms as to improve gait speed for better community ambulation and to reduce fall risk.    Baseline 9/21: 0.5 m/s with RW; 10/10 deferred 10/12: 0.93 m/s with 43GH 11/2: 0.83 m/s with 48EX 12/7: 0.52 m/s with RW, 1/3: 0.704; 06/25/21: 0.55 m/s with SPC. 5/18: 0.4 m/s without AD , 10/15/21 0.507 m/s without AD, 8/1: 0.64 m/s; 9/7: 0.42 m/s with SPC, 0.43 m/s without AD   Time 8    Period Weeks    Status On going    Target Date 05/11/2022     PT LONG TERM GOAL #4   Title Patient will be independent with ascend/descend 6 steps using single UE in step over step pattern without LOB.  Baseline 9/21:  Patient uses PT stairs (4 steps) with bilateral  upper extremity support on handrails for both ascending/descending.  Patient exhibits reciprocal pattern ascending and step to pattern descending. 10/10: deferred; 10/12: ascending/descending recip. steps with BUE support; 11/2: Ascended/descended 8 steps total, reciprocal pattern ascending and step-to descending. Pt uses UUE support throughout with CGA assist.; 12/7: Ascended and descended 8 steps total with UUE support and CGA. Reciprocal stepping ascending and step to descending., 1/3: Deferrred; 06/25/21: able to perform with SUE support and reciprocal pattern asc/desc, but does require close CGA with descending. 5/18: independent with 2 handrails, close supervision with 1 handrai; 10/15/21: requires 2 handrails, 8/1: requires 1 handrail, descends one step at a time; 9/7: uses 1 handrail, recip pattern ascending, able to do recip pattern descending but with one instance of LOB where pt self-corrects    Time 8    Period Weeks    Status Partially Met    Target Date 05/11/22     PT LONG TERM GOAL #5   Title Patient will demonstrate an improved Berg Balance Score of >45/56 as to demonstrate improved balance with ADLs such as sitting/standing and transfer balance and reduced fall risk.    Baseline 9/21: deferred d/t time; 9/29: 42/56; 10/10: deferred; 10/12: 44/56; 11/2: deferred; 11/7: 46/56, 1/3: deferred    Time 8    Period Weeks    Status Achieved    Target Date 12/17/21     PT LONG TERM GOAL #6   Title Patient will improve FOTO score to >60% to indicate improved functional mobility with ADLs.    Baseline 9/21: 59; 10/10: 57; 11/2: 60; 12/7: 58%, 1/3: 61%, 4/13: 56% 10/15/21: 58%, 8/1: 56%; 9/7: 67%   Time 8    Period Weeks    Status MET   Target Date 08/24/23w     PT LONG TERM GOAL #7   Title Pt will improve FGA by a minimum of 5 points to indicate clinically significant improvement in reduced risk of falls with walking tasks.    Baseline 06/25/21: 12, 4/13: deferred, 5/18: deferred due to  increased ankle discomfort; 6/22: deferred due to elevated BP- will assess next visit; 10/20/21: 8/30, 8/1: 8/30; 9/7: 13/30   Time 8    Period Weeks    Status MET   Target Date 12/17/21   PT LONG TERM GOAL #8  Title Pt will improve FGA to >18/30 to indicate clinically significant improvement in reduced risk of falls with walking tasks and improved balance.  Baseline 9/7: 13/30  Time 7  Period Weeks   Status NEW  Target Date 05/11/21            Plan -    Clinical Impression Statement Pt continues to put forth good effort throughout the session today.  Pt noted to have some increased difficulty with ambulation across unstable surface and would benefit from continued stability exercises including tasks that challenge ankle right strategy.   Overall pt is making progress as noted in goal section, however has made regression in certain goals as noted above as well.  Requesting pt to continue with extended POC, however if progress is not made, then pt will be d/c.  Pt will continue to benefit from skilled therapy to address remaining deficits in order to improve overall QoL and return to PLOF.      Personal Factors and Comorbidities Age    Comorbidities HTN, CKD, fall risk, type 2 diabetes,    Examination-Activity Limitations Lift;Locomotion Level;Squat;Stairs;Stand;Toileting;Transfers;Bathing  Examination-Participation Restrictions Cleaning;Community Activity;Laundry;Meal Prep;Shop;Volunteer;Driving    Stability/Clinical Decision Making Stable/Uncomplicated    Rehab Potential Fair    PT Frequency 2x / week    PT Duration 8 weeks    PT Treatment/Interventions Cryotherapy;Electrical Stimulation;Moist Heat;Gait training;Stair training;Functional mobility training;Therapeutic activities;Therapeutic exercise;Neuromuscular re-education;Balance training;Patient/family education;Orthotic Fit/Training;Energy conservation    PT Next Visit Plan Balance reactive strategies, interval walking training,  stair training, glute strengthening   PT Home Exercise Plan No changes this date    Consulted and Agree with Plan of Care Patient             Gwenlyn Saran, PT, DPT 02/16/22, 3:09 PM Physical Therapist - [facility]

## 2022-02-18 ENCOUNTER — Ambulatory Visit: Payer: Medicare HMO

## 2022-02-18 ENCOUNTER — Ambulatory Visit: Payer: Medicare HMO | Admitting: Occupational Therapy

## 2022-02-18 DIAGNOSIS — R262 Difficulty in walking, not elsewhere classified: Secondary | ICD-10-CM

## 2022-02-18 DIAGNOSIS — I639 Cerebral infarction, unspecified: Secondary | ICD-10-CM | POA: Diagnosis not present

## 2022-02-18 DIAGNOSIS — R2681 Unsteadiness on feet: Secondary | ICD-10-CM

## 2022-02-18 DIAGNOSIS — R269 Unspecified abnormalities of gait and mobility: Secondary | ICD-10-CM

## 2022-02-18 DIAGNOSIS — M6281 Muscle weakness (generalized): Secondary | ICD-10-CM

## 2022-02-18 NOTE — Therapy (Signed)
OUTPATIENT PHYSICAL THERAPY TREATMENT NOTE/RE-CERT    Patient Name: Cassandra Schaefer MRN: [id number] DOB:1928-12-13, 88 y.o., female Today's Date: 02/18/2022  PCP: Roetta Sessions MD REFERRING PROVIDER: Roetta Sessions MD   PT End of Session - 02/18/22 1349     Visit Number 112    Number of Visits 125    Date for PT Re-Evaluation 05/11/22    Authorization Type [employer]    Authorization Time Period 02/16/22-05/11/22    Progress Note Due on Visit 120    PT Start Time 5035    PT Stop Time 1430    PT Time Calculation (min) 43 min    Equipment Utilized During Treatment Gait belt    Activity Tolerance Patient tolerated treatment well;No increased pain    Behavior During Therapy WFL for tasks assessed/performed                 Past Medical History:  Diagnosis Date   Anemia    Cough    RESOLVING, NO FEVER   Diabetes mellitus without complication ([place])    Gout    Hypertension    Hypothyroidism    PUD (peptic ulcer disease)    Stroke [facility])    Wears dentures    full upper, partial lower   Past Surgical History:  Procedure Laterality Date   AMPUTATION TOE Left 12/14/2017   Procedure: AMPUTATION TOE-4TH MPJ;  Surgeon: Samara Deist, DPM;  Location: [place];  Service: Podiatry;  Laterality: Left;  IVA LOCAL Diabetic - oral meds   APPENDECTOMY     CATARACT EXTRACTION W/PHACO Right 06/23/2015   Procedure: CATARACT EXTRACTION PHACO AND INTRAOCULAR LENS PLACEMENT (IOC);  Surgeon: Estill Cotta, MD;  Location: [facility];  Service: Ophthalmology;  Laterality: Right;  [place] 01:28 AP% 23.4 CDE 36.14 fluid pack lot # [id number]   CATARACT EXTRACTION W/PHACO Left 07/28/2015   Procedure: CATARACT EXTRACTION PHACO AND INTRAOCULAR LENS PLACEMENT (IOC);  Surgeon: Estill Cotta, MD;  Location: [facility];  Service: Ophthalmology;  Laterality: Left;  [place]    1:29.7 AP%  24.9 CDE   41.32 fluid casette lot #[id number] H exp 09/23/2016   COONOSCOPY     AND ENDOSCOPY   DILATION AND  CURETTAGE OF UTERUS     ENDARTERECTOMY Left 11/13/2020   Procedure: Evacuation of left neck hematoma;  Surgeon: Katha Cabal, MD;  Location: [facility];  Service: Vascular;  Laterality: Left;   ENDARTERECTOMY Left 11/13/2020   Procedure: ENDARTERECTOMY CAROTID;  Surgeon: Algernon Huxley, MD;  Location: [facility];  Service: Vascular;  Laterality: Left;   TONSILLECTOMY     TUBAL LIGATION     Patient Active Problem List   Diagnosis Date Noted   Carotid stenosis, symptomatic, with infarction ([place]) 11/13/2020   Gout flare 11/10/2020   UTI (urinary tract infection) 11/10/2020   Left carotid artery stenosis 11/10/2020   Chronic kidney disease 11/10/2020   Left basal ganglia embolic stroke ([place]) 17/49/4496   PAC (premature atrial contraction)    Pre-procedural cardiovascular examination    Ischemic stroke ([place]) 10/20/2020   TIA (transient ischemic attack) 10/19/2020   Right hemiparesis ([place]) 10/19/2020   Type 2 diabetes mellitus with stage 3 chronic kidney disease, without long-term current use of insulin ([place]) 08/24/2018   Hyperlipidemia, unspecified 07/19/2017   Hypertension 07/19/2017   PUD (peptic ulcer disease) 07/19/2017   Type 2 diabetes mellitus ([place]) 07/19/2017   Personal history of gout 08/12/2016   Anemia, unspecified 04/09/2016   Hypothyroidism, acquired 12/10/2015    REFERRING DIAG:  CVA with right hemiparesis  THERAPY DIAG:  Difficulty in walking, not elsewhere classified  Muscle weakness (generalized)  Abnormality of gait and mobility  Unsteadiness on feet  Rationale for Evaluation and Treatment Rehabilitation  PERTINENT HISTORY: 88 y.o. female with medical history significant for type 2 diabetes mellitus, essential hypertension, acquired hypothyroidism, hyperlipidemia, stage III chronic kidney disease reports increased right sided weakness on 10/19/20. She was diagnosed with left basal ganglia ischemic stroke. She was discharged to inpatient rehab for 10 days and then  discharged home. Patient was evaluated by outpatient PT on 11/12/20. CVA was due to carotid stenosis. She underwent left carotid endartectomy on 7/21. Procedure went well however patient suffered from left cervical hematoma and had subsequent surgical procedure to stop the bleed and remove the hematoma on 11/13/20. They were concerned with her breathing and therefore intubated her for 1 day, she was in ICU for 2 days and transferred to med/surge on 11/15/20. Patient was discharged home on 11/17/20 and is now returning to outpatient PT. She has had the stiches removed and is healing well. Denies any neck pain or stiffness. She lives with her daughter and has 24/7 caregiver support. She is still using RW for most ambulation. She is able to transfer to bedside commode well and is able to stand short time unsupported. She reports feeling very weak and fatigued. She reports she has been dragging her right foot more. She denies any numbness/tingling; She reports feeling like her legs are wanting to do more but is hesitant because of fear of falling. She is mod I for self care morning routine. She does still receive help with showering. She has been working on increasing her activity but denies any formal exercise.   PRECAUTIONS: Fall risk  SUBJECTIVE: Pt reports she is feeling good today with no pain.  Pt denies any stumbles or falls since last visit.   PAIN:  Are you having pain? No   TODAY'S TREATMENT: 02/18/2022  Gait belt donned, CGA-supervision  for safety unless otherwise noted  Neuro Re-Ed:  Patient ambulated 25 min indoor/outdoor today- including tile, concrete, carpet, horizontal head turns, on varied surfaces: concrete, brick, asphalt, pine needles, grass, up ramp, inclines, declines- with tow seated rest breaks - CGA for all indoor/outdoor - one instance of instability when ambulating over pine needles - Focusing on overall endurance and to improve ambulation across uneven terrain that pt would  come in contact with in the community.   TherEx:  Heel raises, 2x15 at first step on stairs Calf stretches at wall surface, 30 sec bouts, pt with increased difficulty performing so switched to inclined surface Calf stretches on inclined surface, 30 sec bouts     PATIENT EDUCATION: Education details: goal reassessment, indications, plan Person educated: Patient Education method: Explanation, Demonstration, and Verbal cues Education comprehension: verbalized understanding, returned demonstration, and needs further education   HOME EXERCISE PROGRAM: 02/04/22: seated RLE hip ABD with green TB, emphasis on eccentric control  Instructed patient in pool exercise: Access Code: [place]: [email or web address] Date: 10/22/2021 Prepared by: Blanche East  Exercises - Seated Knee Extension with Resistance  - 1 x daily - 7 x weekly - 2 sets - 15 reps - Forward and Backward Stepping at [employer]  - 1 x daily - 7 x weekly - 1 sets - 5 reps - Freestyle Swimming with Snorkel Mask  - 1 x daily - 7 x weekly - 1 sets - 2-3 reps - Heel Walking  - 1 x daily -  7 x weekly - 1 sets - 5 reps - Toe Walking  - 1 x daily - 7 x weekly - 1 sets - 5 reps - Single Leg Stance at Pool Wall  - 1 x daily - 7 x weekly - 1 sets - 3-5 reps - 15-30 sec hold   PT Short Term Goals -       PT SHORT TERM GOAL #1   Title Patient will be adherent to HEP at least 3x a week to improve functional strength and balance for better safety at home.    Baseline 9/21: Pt reports compliance with HEP and is confident, 1/3: doing exercise 2x a week; 06/25/21: 3x/week, 5/18: consistent; 8/1: consistent;    Time 4    Period Weeks    Status Achieved    Target Date 06/25/21      PT SHORT TERM GOAL #2   Title Patient will be independent in transferring sit<>Stand without pushing on arm rests to improve ability to get up from chair.    Baseline 9/21: pt able to complete STS without use of UEs, 1/3:able to stand  but has moderate difficulty requiring increased time; 06/25/21: able to perform indep without hands but requiring use of momentum to attain standing. Remains stable. 5/18: able to stand without UE assist, close supervision, remains stable;    Time 4    Period Weeks    Status Achieved    Target Date 07/23/21              PT Long Term Goals -      PT LONG TERM GOAL #1   Title Patient (> 37 years old) will complete five times sit to stand test in < 15 seconds indicating an increased LE strength and improved balance.    Baseline 9/21: 29 seconds hands-free 10/10: deferred 10/12: 34 sec hands free; 11/2: 23.8 sec hands-free; 12/7: 33.2 sec hands free, 1/3: 33.92 hands free; 06/25/21: 25.69 sec, 4/13: 15.5 sec hands free, 5/18: 23 sec without UE 6/22: deferred, 8/1: 15.7 sec without UE; 9/7: 15 sec without UE    Time 8    Period Weeks    Status Partially met   Target Date  05/11/2022     PT LONG TERM GOAL #2   Title Patient will increase six minute walk test distance to >400 feet for improve gait ability    Baseline 9/21: 368 ft with RW; 10/10: deferred 10/12: 408 ft; 11/2: 476 ft with 4WW, 1/3: 475 feet with RW; 4/13: 450 feet without AD CGA 10/15/21: 460 feet without AD, CGA   Time 8    Period Weeks    Status Achieved    Target Date 02/16/22     PT LONG TERM GOAL #3   Title Patient will increase 10 meter walk test to >1.42ms as to improve gait speed for better community ambulation and to reduce fall risk.    Baseline 9/21: 0.5 m/s with RW; 10/10 deferred 10/12: 0.93 m/s with 47YP 11/2: 0.83 m/s with 49JK 12/7: 0.52 m/s with RW, 1/3: 0.704; 06/25/21: 0.55 m/s with SPC. 5/18: 0.4 m/s without AD , 10/15/21 0.507 m/s without AD, 8/1: 0.64 m/s; 9/7: 0.42 m/s with SPC, 0.43 m/s without AD   Time 8    Period Weeks    Status On going    Target Date 05/11/2022     PT LONG TERM GOAL #4   Title Patient will be independent with ascend/descend 6 steps using single UE in step  over step pattern without  LOB.    Baseline 9/21:  Patient uses PT stairs (4 steps) with bilateral upper extremity support on handrails for both ascending/descending.  Patient exhibits reciprocal pattern ascending and step to pattern descending. 10/10: deferred; 10/12: ascending/descending recip. steps with BUE support; 11/2: Ascended/descended 8 steps total, reciprocal pattern ascending and step-to descending. Pt uses UUE support throughout with CGA assist.; 12/7: Ascended and descended 8 steps total with UUE support and CGA. Reciprocal stepping ascending and step to descending., 1/3: Deferrred; 06/25/21: able to perform with SUE support and reciprocal pattern asc/desc, but does require close CGA with descending. 5/18: independent with 2 handrails, close supervision with 1 handrai; 10/15/21: requires 2 handrails, 8/1: requires 1 handrail, descends one step at a time; 9/7: uses 1 handrail, recip pattern ascending, able to do recip pattern descending but with one instance of LOB where pt self-corrects    Time 8    Period Weeks    Status Partially Met    Target Date 05/11/22     PT LONG TERM GOAL #5   Title Patient will demonstrate an improved Berg Balance Score of >45/56 as to demonstrate improved balance with ADLs such as sitting/standing and transfer balance and reduced fall risk.    Baseline 9/21: deferred d/t time; 9/29: 42/56; 10/10: deferred; 10/12: 44/56; 11/2: deferred; 11/7: 46/56, 1/3: deferred    Time 8    Period Weeks    Status Achieved    Target Date 12/17/21     PT LONG TERM GOAL #6   Title Patient will improve FOTO score to >60% to indicate improved functional mobility with ADLs.    Baseline 9/21: 59; 10/10: 57; 11/2: 60; 12/7: 58%, 1/3: 61%, 4/13: 56% 10/15/21: 58%, 8/1: 56%; 9/7: 67%   Time 8    Period Weeks    Status MET   Target Date 08/24/23w     PT LONG TERM GOAL #7   Title Pt will improve FGA by a minimum of 5 points to indicate clinically significant improvement in reduced risk of falls with walking  tasks.    Baseline 06/25/21: 12, 4/13: deferred, 5/18: deferred due to increased ankle discomfort; 6/22: deferred due to elevated BP- will assess next visit; 10/20/21: 8/30, 8/1: 8/30; 9/7: 13/30   Time 8    Period Weeks    Status MET   Target Date 12/17/21   PT LONG TERM GOAL #8  Title Pt will improve FGA to >18/30 to indicate clinically significant improvement in reduced risk of falls with walking tasks and improved balance.  Baseline 9/7: 13/30  Time 7  Period Weeks   Status NEW  Target Date 05/11/21            Plan -    Clinical Impression Statement Pt responded well today to indoor/outdoor ambulation that challenged her response to external factors that she might come into contact in community ambulation activities.  Pt did noticed cramping in the calves upon completion, so pt was instructed on calf stretches and noticed a significant amount of relief following the stretches.  Pt will continue to benefit from increased challenging aspects that she may come in contact with to prevent future falls.   Pt will continue to benefit from skilled therapy to address remaining deficits in order to improve overall QoL and return to PLOF.      Personal Factors and Comorbidities Age    Comorbidities HTN, CKD, fall risk, type 2 diabetes,    Examination-Activity Limitations Lift;Locomotion Level;Squat;Stairs;Stand;Toileting;Transfers;Bathing  Examination-Participation Restrictions Cleaning;Community Activity;Laundry;Meal Prep;Shop;Volunteer;Driving    Stability/Clinical Decision Making Stable/Uncomplicated    Rehab Potential Fair    PT Frequency 2x / week    PT Duration 8 weeks    PT Treatment/Interventions Cryotherapy;Electrical Stimulation;Moist Heat;Gait training;Stair training;Functional mobility training;Therapeutic activities;Therapeutic exercise;Neuromuscular re-education;Balance training;Patient/family education;Orthotic Fit/Training;Energy conservation    PT Next Visit Plan Balance  reactive strategies, interval walking training, stair training, glute strengthening   PT Home Exercise Plan No changes this date    Consulted and Agree with Plan of Care Patient             Gwenlyn Saran, PT, DPT Physical Therapist- [facility]  02/18/22, 5:02 PM

## 2022-02-22 ENCOUNTER — Encounter (INDEPENDENT_AMBULATORY_CARE_PROVIDER_SITE_OTHER): Payer: Self-pay

## 2022-02-23 ENCOUNTER — Ambulatory Visit: Payer: Medicare HMO

## 2022-02-23 ENCOUNTER — Ambulatory Visit: Payer: Medicare HMO | Admitting: Occupational Therapy

## 2022-02-23 DIAGNOSIS — M6281 Muscle weakness (generalized): Secondary | ICD-10-CM

## 2022-02-23 DIAGNOSIS — R269 Unspecified abnormalities of gait and mobility: Secondary | ICD-10-CM

## 2022-02-23 DIAGNOSIS — R262 Difficulty in walking, not elsewhere classified: Secondary | ICD-10-CM

## 2022-02-23 DIAGNOSIS — R2681 Unsteadiness on feet: Secondary | ICD-10-CM

## 2022-02-23 DIAGNOSIS — R482 Apraxia: Secondary | ICD-10-CM

## 2022-02-23 DIAGNOSIS — I639 Cerebral infarction, unspecified: Secondary | ICD-10-CM | POA: Diagnosis not present

## 2022-02-23 NOTE — Therapy (Signed)
OUTPATIENT PHYSICAL THERAPY TREATMENT NOTE/RE-CERT    Patient Name: Cassandra Schaefer MRN: [id number] DOB:03-29-1929, 88 y.o., female Today's Date: 02/23/2022  PCP: Roetta Sessions MD REFERRING PROVIDER: Roetta Sessions MD   PT End of Session - 02/23/22 1353     Visit Number 113    Number of Visits 125    Date for PT Re-Evaluation 05/11/22    Authorization Type [employer]    Authorization Time Period 02/16/22-05/11/22    Progress Note Due on Visit 120    PT Start Time 1352    PT Stop Time 1430    PT Time Calculation (min) 38 min    Equipment Utilized During Treatment Gait belt    Activity Tolerance Patient tolerated treatment well;No increased pain    Behavior During Therapy WFL for tasks assessed/performed                 Past Medical History:  Diagnosis Date   Anemia    Cough    RESOLVING, NO FEVER   Diabetes mellitus without complication ([place])    Gout    Hypertension    Hypothyroidism    PUD (peptic ulcer disease)    Stroke [facility])    Wears dentures    full upper, partial lower   Past Surgical History:  Procedure Laterality Date   AMPUTATION TOE Left 12/14/2017   Procedure: AMPUTATION TOE-4TH MPJ;  Surgeon: Samara Deist, DPM;  Location: [place];  Service: Podiatry;  Laterality: Left;  IVA LOCAL Diabetic - oral meds   APPENDECTOMY     CATARACT EXTRACTION W/PHACO Right 06/23/2015   Procedure: CATARACT EXTRACTION PHACO AND INTRAOCULAR LENS PLACEMENT (IOC);  Surgeon: Estill Cotta, MD;  Location: [facility];  Service: Ophthalmology;  Laterality: Right;  [place] 01:28 AP% 23.4 CDE 36.14 fluid pack lot # [id number]   CATARACT EXTRACTION W/PHACO Left 07/28/2015   Procedure: CATARACT EXTRACTION PHACO AND INTRAOCULAR LENS PLACEMENT (IOC);  Surgeon: Estill Cotta, MD;  Location: [facility];  Service: Ophthalmology;  Laterality: Left;  [place]    1:29.7 AP%  24.9 CDE   41.32 fluid casette lot #[id number] H exp 09/23/2016   COONOSCOPY     AND ENDOSCOPY   DILATION AND  CURETTAGE OF UTERUS     ENDARTERECTOMY Left 11/13/2020   Procedure: Evacuation of left neck hematoma;  Surgeon: Katha Cabal, MD;  Location: [facility];  Service: Vascular;  Laterality: Left;   ENDARTERECTOMY Left 11/13/2020   Procedure: ENDARTERECTOMY CAROTID;  Surgeon: Algernon Huxley, MD;  Location: [facility];  Service: Vascular;  Laterality: Left;   TONSILLECTOMY     TUBAL LIGATION     Patient Active Problem List   Diagnosis Date Noted   Carotid stenosis, symptomatic, with infarction ([place]) 11/13/2020   Gout flare 11/10/2020   UTI (urinary tract infection) 11/10/2020   Left carotid artery stenosis 11/10/2020   Chronic kidney disease 11/10/2020   Left basal ganglia embolic stroke ([place]) 11/04/1973   PAC (premature atrial contraction)    Pre-procedural cardiovascular examination    Ischemic stroke ([place]) 10/20/2020   TIA (transient ischemic attack) 10/19/2020   Right hemiparesis ([place]) 10/19/2020   Type 2 diabetes mellitus with stage 3 chronic kidney disease, without long-term current use of insulin ([place]) 08/24/2018   Hyperlipidemia, unspecified 07/19/2017   Hypertension 07/19/2017   PUD (peptic ulcer disease) 07/19/2017   Type 2 diabetes mellitus ([place]) 07/19/2017   Personal history of gout 08/12/2016   Anemia, unspecified 04/09/2016   Hypothyroidism, acquired 12/10/2015    REFERRING DIAG:  CVA with right hemiparesis  THERAPY DIAG:  Difficulty in walking, not elsewhere classified  Muscle weakness (generalized)  Abnormality of gait and mobility  Unsteadiness on feet  Apraxia  Rationale for Evaluation and Treatment Rehabilitation  PERTINENT HISTORY: 88 y.o. female with medical history significant for type 2 diabetes mellitus, essential hypertension, acquired hypothyroidism, hyperlipidemia, stage III chronic kidney disease reports increased right sided weakness on 10/19/20. She was diagnosed with left basal ganglia ischemic stroke. She was discharged to inpatient rehab for 10  days and then discharged home. Patient was evaluated by outpatient PT on 11/12/20. CVA was due to carotid stenosis. She underwent left carotid endartectomy on 7/21. Procedure went well however patient suffered from left cervical hematoma and had subsequent surgical procedure to stop the bleed and remove the hematoma on 11/13/20. They were concerned with her breathing and therefore intubated her for 1 day, she was in ICU for 2 days and transferred to med/surge on 11/15/20. Patient was discharged home on 11/17/20 and is now returning to outpatient PT. She has had the stiches removed and is healing well. Denies any neck pain or stiffness. She lives with her daughter and has 24/7 caregiver support. She is still using RW for most ambulation. She is able to transfer to bedside commode well and is able to stand short time unsupported. She reports feeling very weak and fatigued. She reports she has been dragging her right foot more. She denies any numbness/tingling; She reports feeling like her legs are wanting to do more but is hesitant because of fear of falling. She is mod I for self care morning routine. She does still receive help with showering. She has been working on increasing her activity but denies any formal exercise.   PRECAUTIONS: Fall risk  SUBJECTIVE:   Pt notes she is feeling good today and ready to being therapy.  Pt denies any falls upon arrival since the last visit.   PAIN:  Are you having pain? No   TODAY'S TREATMENT: 02/23/2022  Gait belt donned, CGA-supervision  for safety unless otherwise noted  Neuro Re-Ed:   Ambulation 340f around gym without AD, CGA utilized for safety Dynamic stance on airex pad while playing hangman at whiteboard using cross body reaching, cognitive and balance related tasks necessary for improved functional stability, several rounds of this task.       TherEx:   712fambulation in hallway without AD: Backwards ambulation length of hallway, x4 Side  stepping length of hallway, x2 laps each direction High knees length of hallway, x4      PATIENT EDUCATION: Education details: goal reassessment, indications, plan Person educated: Patient Education method: Explanation, Demonstration, and Verbal cues Education comprehension: verbalized understanding, returned demonstration, and needs further education   HOME EXERCISE PROGRAM: 02/04/22: seated RLE hip ABD with green TB, emphasis on eccentric control  Instructed patient in pool exercise: Access Code: [email or web address] Date: 10/22/2021 Prepared by: MaBlanche EastExercises - Seated Knee Extension with Resistance  - 1 x daily - 7 x weekly - 2 sets - 15 reps - Forward and Backward Stepping at [facility]- 1 x daily - 7 x weekly - 1 sets - 5 reps - Freestyle Swimming with Snorkel Mask  - 1 x daily - 7 x weekly - 1 sets - 2-3 reps - Heel Walking  - 1 x daily - 7 x weekly - 1 sets - 5 reps - Toe Walking  - 1 x daily - 7 x weekly -  1 sets - 5 reps - Single Leg Stance at Pool Wall  - 1 x daily - 7 x weekly - 1 sets - 3-5 reps - 15-30 sec hold   PT Short Term Goals -       PT SHORT TERM GOAL #1   Title Patient will be adherent to HEP at least 3x a week to improve functional strength and balance for better safety at home.    Baseline 9/21: Pt reports compliance with HEP and is confident, 1/3: doing exercise 2x a week; 06/25/21: 3x/week, 5/18: consistent; 8/1: consistent;    Time 4    Period Weeks    Status Achieved    Target Date 06/25/21      PT SHORT TERM GOAL #2   Title Patient will be independent in transferring sit<>Stand without pushing on arm rests to improve ability to get up from chair.    Baseline 9/21: pt able to complete STS without use of UEs, 1/3:able to stand but has moderate difficulty requiring increased time; 06/25/21: able to perform indep without hands but requiring use of momentum to attain standing. Remains stable. 5/18: able to  stand without UE assist, close supervision, remains stable;    Time 4    Period Weeks    Status Achieved    Target Date 07/23/21              PT Long Term Goals -      PT LONG TERM GOAL #1   Title Patient (> 20 years old) will complete five times sit to stand test in < 15 seconds indicating an increased LE strength and improved balance.    Baseline 9/21: 29 seconds hands-free 10/10: deferred 10/12: 34 sec hands free; 11/2: 23.8 sec hands-free; 12/7: 33.2 sec hands free, 1/3: 33.92 hands free; 06/25/21: 25.69 sec, 4/13: 15.5 sec hands free, 5/18: 23 sec without UE 6/22: deferred, 8/1: 15.7 sec without UE; 9/7: 15 sec without UE    Time 8    Period Weeks    Status Partially met   Target Date  05/11/2022     PT LONG TERM GOAL #2   Title Patient will increase six minute walk test distance to >400 feet for improve gait ability    Baseline 9/21: 368 ft with RW; 10/10: deferred 10/12: 408 ft; 11/2: 476 ft with 4WW, 1/3: 475 feet with RW; 4/13: 450 feet without AD CGA 10/15/21: 460 feet without AD, CGA   Time 8    Period Weeks    Status Achieved    Target Date 02/16/22     PT LONG TERM GOAL #3   Title Patient will increase 10 meter walk test to >1.57ms as to improve gait speed for better community ambulation and to reduce fall risk.    Baseline 9/21: 0.5 m/s with RW; 10/10 deferred 10/12: 0.93 m/s with 43XO 11/2: 0.83 m/s with 43AN 12/7: 0.52 m/s with RW, 1/3: 0.704; 06/25/21: 0.55 m/s with SPC. 5/18: 0.4 m/s without AD , 10/15/21 0.507 m/s without AD, 8/1: 0.64 m/s; 9/7: 0.42 m/s with SPC, 0.43 m/s without AD   Time 8    Period Weeks    Status On going    Target Date 05/11/2022     PT LONG TERM GOAL #4   Title Patient will be independent with ascend/descend 6 steps using single UE in step over step pattern without LOB.    Baseline 9/21:  Patient uses PT stairs (4 steps) with bilateral upper extremity support  on handrails for both ascending/descending.  Patient exhibits reciprocal pattern  ascending and step to pattern descending. 10/10: deferred; 10/12: ascending/descending recip. steps with BUE support; 11/2: Ascended/descended 8 steps total, reciprocal pattern ascending and step-to descending. Pt uses UUE support throughout with CGA assist.; 12/7: Ascended and descended 8 steps total with UUE support and CGA. Reciprocal stepping ascending and step to descending., 1/3: Deferrred; 06/25/21: able to perform with SUE support and reciprocal pattern asc/desc, but does require close CGA with descending. 5/18: independent with 2 handrails, close supervision with 1 handrai; 10/15/21: requires 2 handrails, 8/1: requires 1 handrail, descends one step at a time; 9/7: uses 1 handrail, recip pattern ascending, able to do recip pattern descending but with one instance of LOB where pt self-corrects    Time 8    Period Weeks    Status Partially Met    Target Date 05/11/22     PT LONG TERM GOAL #5   Title Patient will demonstrate an improved Berg Balance Score of >45/56 as to demonstrate improved balance with ADLs such as sitting/standing and transfer balance and reduced fall risk.    Baseline 9/21: deferred d/t time; 9/29: 42/56; 10/10: deferred; 10/12: 44/56; 11/2: deferred; 11/7: 46/56, 1/3: deferred    Time 8    Period Weeks    Status Achieved    Target Date 12/17/21     PT LONG TERM GOAL #6   Title Patient will improve FOTO score to >60% to indicate improved functional mobility with ADLs.    Baseline 9/21: 59; 10/10: 57; 11/2: 60; 12/7: 58%, 1/3: 61%, 4/13: 56% 10/15/21: 58%, 8/1: 56%; 9/7: 67%   Time 8    Period Weeks    Status MET   Target Date 08/24/23w     PT LONG TERM GOAL #7   Title Pt will improve FGA by a minimum of 5 points to indicate clinically significant improvement in reduced risk of falls with walking tasks.    Baseline 06/25/21: 12, 4/13: deferred, 5/18: deferred due to increased ankle discomfort; 6/22: deferred due to elevated BP- will assess next visit; 10/20/21: 8/30, 8/1:  8/30; 9/7: 13/30   Time 8    Period Weeks    Status MET   Target Date 12/17/21   PT LONG TERM GOAL #8  Title Pt will improve FGA to >18/30 to indicate clinically significant improvement in reduced risk of falls with walking tasks and improved balance.  Baseline 9/7: 13/30  Time 7  Period Weeks   Status NEW  Target Date 05/11/21            Plan -    Clinical Impression Statement Pt responded well today to indoor/outdoor ambulation that challenged her response to external factors that she might come into contact in community ambulation activities.  Pt did noticed cramping in the calves upon completion, so pt was instructed on calf stretches and noticed a significant amount of relief following the stretches.  Pt will continue to benefit from increased challenging aspects that she may come in contact with to prevent future falls.   Pt will continue to benefit from skilled therapy to address remaining deficits in order to improve overall QoL and return to PLOF.      Personal Factors and Comorbidities Age    Comorbidities HTN, CKD, fall risk, type 2 diabetes,    Examination-Activity Limitations Lift;Locomotion Level;Squat;Stairs;Stand;Toileting;Transfers;Bathing    Examination-Participation Restrictions Cleaning;Community Activity;Laundry;Meal Prep;Shop;Volunteer;Driving    Stability/Clinical Decision Making Stable/Uncomplicated    Rehab Potential Fair  PT Frequency 2x / week    PT Duration 8 weeks    PT Treatment/Interventions Cryotherapy;Electrical Stimulation;Moist Heat;Gait training;Stair training;Functional mobility training;Therapeutic activities;Therapeutic exercise;Neuromuscular re-education;Balance training;Patient/family education;Orthotic Fit/Training;Energy conservation    PT Next Visit Plan Balance reactive strategies, interval walking training, stair training, glute strengthening   PT Home Exercise Plan No changes this date    Consulted and Agree with Plan of Care Patient              Gwenlyn Saran, PT, DPT Physical Therapist- [facility]  02/23/22, 10:33 PM

## 2022-02-25 ENCOUNTER — Ambulatory Visit: Payer: Medicare HMO | Admitting: Occupational Therapy

## 2022-02-25 ENCOUNTER — Ambulatory Visit: Payer: Medicare HMO | Attending: Vascular Surgery

## 2022-02-25 DIAGNOSIS — R2681 Unsteadiness on feet: Secondary | ICD-10-CM

## 2022-02-25 DIAGNOSIS — R482 Apraxia: Secondary | ICD-10-CM | POA: Diagnosis present

## 2022-02-25 DIAGNOSIS — R262 Difficulty in walking, not elsewhere classified: Secondary | ICD-10-CM | POA: Diagnosis not present

## 2022-02-25 DIAGNOSIS — R269 Unspecified abnormalities of gait and mobility: Secondary | ICD-10-CM | POA: Diagnosis present

## 2022-02-25 DIAGNOSIS — R278 Other lack of coordination: Secondary | ICD-10-CM | POA: Insufficient documentation

## 2022-02-25 DIAGNOSIS — M6281 Muscle weakness (generalized): Secondary | ICD-10-CM

## 2022-02-25 NOTE — Therapy (Addendum)
OUTPATIENT PHYSICAL THERAPY TREATMENT NOTE/RE-CERT    Patient Name: Cassandra Schaefer MRN: [id number] DOB:01/16/1929, 88 y.o., female Today's Date: 02/25/2022  PCP: Hande, V. MD REFERRING PROVIDER: Hande, V. MD   PT End of Session - 02/25/22 1349     Visit Number 114    Number of Visits 125    Date for PT Re-Evaluation 05/11/22    Authorization Type [employer]    Authorization Time Period 02/16/22-05/11/22    Progress Note Due on Visit 120    PT Start Time 1346    PT Stop Time 1430    PT Time Calculation (min) 44 min    Equipment Utilized During Treatment Gait belt    Activity Tolerance Patient tolerated treatment well;No increased pain    Behavior During Therapy WFL for tasks assessed/performed               Past Medical History:  Diagnosis Date   Anemia    Cough    RESOLVING, NO FEVER   Diabetes mellitus without complication (HCC)    Gout    Hypertension    Hypothyroidism    PUD (peptic ulcer disease)    Stroke (HCC)    Wears dentures    full upper, partial lower   Past Surgical History:  Procedure Laterality Date   AMPUTATION TOE Left 12/14/2017   Procedure: AMPUTATION TOE-4TH MPJ;  Surgeon: Fowler, Justin, DPM;  Location: [facility];  Service: Podiatry;  Laterality: Left;  IVA LOCAL Diabetic - oral meds   APPENDECTOMY     CATARACT EXTRACTION W/PHACO Right 06/23/2015   Procedure: CATARACT EXTRACTION PHACO AND INTRAOCULAR LENS PLACEMENT (IOC);  Surgeon: Steven Dingeldein, MD;  Location: [facility];  Service: Ophthalmology;  Laterality: Right;  US 01:28 AP% 23.4 CDE 36.14 fluid pack lot # [id number]   CATARACT EXTRACTION W/PHACO Left 07/28/2015   Procedure: CATARACT EXTRACTION PHACO AND INTRAOCULAR LENS PLACEMENT (IOC);  Surgeon: Steven Dingeldein, MD;  Location: [facility];  Service: Ophthalmology;  Laterality: Left;  US    1:29.7 AP%  24.9 CDE   41.32 fluid casette lot #[id number] exp 09/23/2016   COONOSCOPY     AND ENDOSCOPY   DILATION AND CURETTAGE  OF UTERUS     ENDARTERECTOMY Left 11/13/2020   Procedure: Evacuation of left neck hematoma;  Surgeon: Schnier, Gregory G, MD;  Location: [facility];  Service: Vascular;  Laterality: Left;   ENDARTERECTOMY Left 11/13/2020   Procedure: ENDARTERECTOMY CAROTID;  Surgeon: Dew, Jason S, MD;  Location: [facility];  Service: Vascular;  Laterality: Left;   TONSILLECTOMY     TUBAL LIGATION     Patient Active Problem List   Diagnosis Date Noted   Carotid stenosis, symptomatic, with infarction (HCC) 11/13/2020   Gout flare 11/10/2020   UTI (urinary tract infection) 11/10/2020   Left carotid artery stenosis 11/10/2020   Chronic kidney disease 11/10/2020   Left basal ganglia embolic stroke (HCC) 10/24/2020   PAC (premature atrial contraction)    Pre-procedural cardiovascular examination    Ischemic stroke (HCC) 10/20/2020   TIA (transient ischemic attack) 10/19/2020   Right hemiparesis (HCC) 10/19/2020   Type 2 diabetes mellitus with stage 3 chronic kidney disease, without long-term current use of insulin (HCC) 08/24/2018   Hyperlipidemia, unspecified 07/19/2017   Hypertension 07/19/2017   PUD (peptic ulcer disease) 07/19/2017   Type 2 diabetes mellitus (HCC) 07/19/2017   Personal history of gout 08/12/2016   Anemia, unspecified 04/09/2016   Hypothyroidism, acquired 12/10/2015    REFERRING DIAG: CVA with   right hemiparesis  THERAPY DIAG:  Difficulty in walking, not elsewhere classified  Muscle weakness (generalized)  Abnormality of gait and mobility  Unsteadiness on feet  Rationale for Evaluation and Treatment Rehabilitation  PERTINENT HISTORY: 88 y.o. female with medical history significant for type 2 diabetes mellitus, essential hypertension, acquired hypothyroidism, hyperlipidemia, stage III chronic kidney disease reports increased right sided weakness on 10/19/20. She was diagnosed with left basal ganglia ischemic stroke. She was discharged to inpatient rehab for 10 days and then discharged  home. Patient was evaluated by outpatient PT on 11/12/20. CVA was due to carotid stenosis. She underwent left carotid endartectomy on 7/21. Procedure went well however patient suffered from left cervical hematoma and had subsequent surgical procedure to stop the bleed and remove the hematoma on 11/13/20. They were concerned with her breathing and therefore intubated her for 1 day, she was in ICU for 2 days and transferred to med/surge on 11/15/20. Patient was discharged home on 11/17/20 and is now returning to outpatient PT. She has had the stiches removed and is healing well. Denies any neck pain or stiffness. She lives with her daughter and has 24/7 caregiver support. She is still using RW for most ambulation. She is able to transfer to bedside commode well and is able to stand short time unsupported. She reports feeling very weak and fatigued. She reports she has been dragging her right foot more. She denies any numbness/tingling; She reports feeling like her legs are wanting to do more but is hesitant because of fear of falling. She is mod I for self care morning routine. She does still receive help with showering. She has been working on increasing her activity but denies any formal exercise.   PRECAUTIONS: Fall risk  SUBJECTIVE:   Pt stating she has been fighting her sugar and hasn't been able to keep it down.  Pt notes she was on [place] for a while and it worked great, but her co-pay was >$300 and couldn't afford it, so she had to switch to something else.  Pt notes her sugar has been mostly 250-300 when she measures it.   PAIN:  Are you having pain? No   TODAY'S TREATMENT: 02/25/2022  Gait belt donned, CGA-supervision  for safety unless otherwise noted  Neuro Re-Ed:   Ambulation 366f around gym without AD, CGA utilized for safety Dynamic stance on airex pad while playing hangman at whiteboard using cross body reaching, cognitive and balance related tasks necessary for improved functional  stability, several rounds of this task.      TherEx:  NuStep, seat 7, arms 7, interval training for improved cardiovascular performance Level 1 - 1 min Level 3 - 1 min Level 2 - 1 min Level 4 - 1 min Level 2 - 1 min Level 1 - 1 min as cool-off period  Pt noting her HR to be getting high and was noted to be 124 bpm following Level 4, so she paused and allowed for her HR to slow down prior to continuing.  HR 104 following the conclusion and quickly dropped to 85 bpm a minute following the exercise conclusion.  BP taken at conclusion of interval training as well: BP: 177/63   Seated: Hamstring curls with BTB for resistance, 2x15 each LE Hip abduction with BTB for resistance around distal thigh, 2x15 Marches with 3# AW donned and washcloths utilized for comfort, 2x15 each LE LAQ with 3# AW donned and washcloths utilized for comfort, 2x15 each LE    PATIENT EDUCATION: Education  details: goal reassessment, indications, plan Person educated: Patient Education method: Explanation, Demonstration, and Verbal cues Education comprehension: verbalized understanding, returned demonstration, and needs further education   HOME EXERCISE PROGRAM: 02/04/22: seated RLE hip ABD with green TB, emphasis on eccentric control  Instructed patient in pool exercise: Access Code: [place]: [email or web address] Date: 10/22/2021 Prepared by: Blanche East  Exercises - Seated Knee Extension with Resistance  - 1 x daily - 7 x weekly - 2 sets - 15 reps - Forward and Backward Stepping at [employer]  - 1 x daily - 7 x weekly - 1 sets - 5 reps - Freestyle Swimming with Snorkel Mask  - 1 x daily - 7 x weekly - 1 sets - 2-3 reps - Heel Walking  - 1 x daily - 7 x weekly - 1 sets - 5 reps - Toe Walking  - 1 x daily - 7 x weekly - 1 sets - 5 reps - Single Leg Stance at Pool Wall  - 1 x daily - 7 x weekly - 1 sets - 3-5 reps - 15-30 sec hold   PT Short Term Goals -       PT SHORT TERM  GOAL #1   Title Patient will be adherent to HEP at least 3x a week to improve functional strength and balance for better safety at home.    Baseline 9/21: Pt reports compliance with HEP and is confident, 1/3: doing exercise 2x a week; 06/25/21: 3x/week, 5/18: consistent; 8/1: consistent;    Time 4    Period Weeks    Status Achieved    Target Date 06/25/21      PT SHORT TERM GOAL #2   Title Patient will be independent in transferring sit<>Stand without pushing on arm rests to improve ability to get up from chair.    Baseline 9/21: pt able to complete STS without use of UEs, 1/3:able to stand but has moderate difficulty requiring increased time; 06/25/21: able to perform indep without hands but requiring use of momentum to attain standing. Remains stable. 5/18: able to stand without UE assist, close supervision, remains stable;    Time 4    Period Weeks    Status Achieved    Target Date 07/23/21              PT Long Term Goals -      PT LONG TERM GOAL #1   Title Patient (> 60 years old) will complete five times sit to stand test in < 15 seconds indicating an increased LE strength and improved balance.    Baseline 9/21: 29 seconds hands-free 10/10: deferred 10/12: 34 sec hands free; 11/2: 23.8 sec hands-free; 12/7: 33.2 sec hands free, 1/3: 33.92 hands free; 06/25/21: 25.69 sec, 4/13: 15.5 sec hands free, 5/18: 23 sec without UE 6/22: deferred, 8/1: 15.7 sec without UE; 9/7: 15 sec without UE    Time 8    Period Weeks    Status Partially met   Target Date  05/11/2022     PT LONG TERM GOAL #2   Title Patient will increase six minute walk test distance to >400 feet for improve gait ability    Baseline 9/21: 368 ft with RW; 10/10: deferred 10/12: 408 ft; 11/2: 476 ft with 4WW, 1/3: 475 feet with RW; 4/13: 450 feet without AD CGA 10/15/21: 460 feet without AD, CGA   Time 8    Period Weeks    Status Achieved    Target Date 02/16/22  PT LONG TERM GOAL #3   Title Patient will increase 10  meter walk test to >1.0m/s as to improve gait speed for better community ambulation and to reduce fall risk.    Baseline 9/21: 0.5 m/s with RW; 10/10 deferred 10/12: 0.93 m/s with 4WW; 11/2: 0.83 m/s with 4WW; 12/7: 0.52 m/s with RW, 1/3: 0.704; 06/25/21: 0.55 m/s with SPC. 5/18: 0.4 m/s without AD , 10/15/21 0.507 m/s without AD, 8/1: 0.64 m/s; 9/7: 0.42 m/s with SPC, 0.43 m/s without AD   Time 8    Period Weeks    Status On going    Target Date 05/11/2022     PT LONG TERM GOAL #4   Title Patient will be independent with ascend/descend 6 steps using single UE in step over step pattern without LOB.    Baseline 9/21:  Patient uses PT stairs (4 steps) with bilateral upper extremity support on handrails for both ascending/descending.  Patient exhibits reciprocal pattern ascending and step to pattern descending. 10/10: deferred; 10/12: ascending/descending recip. steps with BUE support; 11/2: Ascended/descended 8 steps total, reciprocal pattern ascending and step-to descending. Pt uses UUE support throughout with CGA assist.; 12/7: Ascended and descended 8 steps total with UUE support and CGA. Reciprocal stepping ascending and step to descending., 1/3: Deferrred; 06/25/21: able to perform with SUE support and reciprocal pattern asc/desc, but does require close CGA with descending. 5/18: independent with 2 handrails, close supervision with 1 handrai; 10/15/21: requires 2 handrails, 8/1: requires 1 handrail, descends one step at a time; 9/7: uses 1 handrail, recip pattern ascending, able to do recip pattern descending but with one instance of LOB where pt self-corrects    Time 8    Period Weeks    Status Partially Met    Target Date 05/11/22     PT LONG TERM GOAL #5   Title Patient will demonstrate an improved Berg Balance Score of >45/56 as to demonstrate improved balance with ADLs such as sitting/standing and transfer balance and reduced fall risk.    Baseline 9/21: deferred d/t time; 9/29: 42/56; 10/10:  deferred; 10/12: 44/56; 11/2: deferred; 11/7: 46/56, 1/3: deferred    Time 8    Period Weeks    Status Achieved    Target Date 12/17/21     PT LONG TERM GOAL #6   Title Patient will improve FOTO score to >60% to indicate improved functional mobility with ADLs.    Baseline 9/21: 59; 10/10: 57; 11/2: 60; 12/7: 58%, 1/3: 61%, 4/13: 56% 10/15/21: 58%, 8/1: 56%; 9/7: 67%   Time 8    Period Weeks    Status MET   Target Date 08/24/23w     PT LONG TERM GOAL #7   Title Pt will improve FGA by a minimum of 5 points to indicate clinically significant improvement in reduced risk of falls with walking tasks.    Baseline 06/25/21: 12, 4/13: deferred, 5/18: deferred due to increased ankle discomfort; 6/22: deferred due to elevated BP- will assess next visit; 10/20/21: 8/30, 8/1: 8/30; 9/7: 13/30   Time 8    Period Weeks    Status MET   Target Date 12/17/21   PT LONG TERM GOAL #8  Title Pt will improve FGA to >18/30 to indicate clinically significant improvement in reduced risk of falls with walking tasks and improved balance.  Baseline 9/7: 13/30  Time 7  Period Weeks   Status NEW  Target Date 05/11/21              Plan -    Clinical Impression Statement Pt continues to put forth good effort throughout therapy sessions.  Pt did have episode where she felt her heart racing and requested to pause on the NuStep for a short bout of rest.  Pt very in-tune with her HR autonomic response to exercise and is able to voice those concerns effectively with therapist.  Pt fatigued at the end of session, however noted it to be a good, challenging, session.   Pt will continue to benefit from skilled therapy to address remaining deficits in order to improve overall QoL and return to PLOF.      Personal Factors and Comorbidities Age    Comorbidities HTN, CKD, fall risk, type 2 diabetes,    Examination-Activity Limitations Lift;Locomotion Level;Squat;Stairs;Stand;Toileting;Transfers;Bathing     Examination-Participation Restrictions Cleaning;Community Activity;Laundry;Meal Prep;Shop;Volunteer;Driving    Stability/Clinical Decision Making Stable/Uncomplicated    Rehab Potential Fair    PT Frequency 2x / week    PT Duration 8 weeks    PT Treatment/Interventions Cryotherapy;Electrical Stimulation;Moist Heat;Gait training;Stair training;Functional mobility training;Therapeutic activities;Therapeutic exercise;Neuromuscular re-education;Balance training;Patient/family education;Orthotic Fit/Training;Energy conservation    PT Next Visit Plan Balance reactive strategies, interval walking training, stair training, glute strengthening   PT Home Exercise Plan No changes this date    Consulted and Agree with Plan of Care Patient             Gwenlyn Saran, PT, DPT Physical Therapist- [facility]  02/25/22, 5:26 PM

## 2022-03-01 NOTE — Therapy (Signed)
OUTPATIENT PHYSICAL THERAPY TREATMENT NOTE    Patient Name: Cassandra Schaefer MRN: [id number] DOB:[birth year], 88 y.o., female Today's Date: 03/02/2022  PCP: Roetta Sessions MD REFERRING PROVIDER: Roetta Sessions MD   PT End of Session - 03/02/22 1444     Visit Number 115    Number of Visits 125    Date for PT Re-Evaluation 05/11/22    Authorization Type [employer]    Authorization Time Period 02/16/22-05/11/22    Progress Note Due on Visit 120    PT Start Time 1442    PT Stop Time 1515    PT Time Calculation (min) 33 min    Equipment Utilized During Treatment Gait belt    Activity Tolerance Patient tolerated treatment well;No increased pain    Behavior During Therapy WFL for tasks assessed/performed                Past Medical History:  Diagnosis Date   Anemia    Cough    RESOLVING, NO FEVER   Diabetes mellitus without complication ([place])    Gout    Hypertension    Hypothyroidism    PUD (peptic ulcer disease)    Stroke [facility])    Wears dentures    full upper, partial lower   Past Surgical History:  Procedure Laterality Date   AMPUTATION TOE Left 12/14/2017   Procedure: AMPUTATION TOE-4TH MPJ;  Surgeon: Samara Deist, DPM;  Location: [place];  Service: Podiatry;  Laterality: Left;  IVA LOCAL Diabetic - oral meds   APPENDECTOMY     CATARACT EXTRACTION W/PHACO Right 06/23/2015   Procedure: CATARACT EXTRACTION PHACO AND INTRAOCULAR LENS PLACEMENT (IOC);  Surgeon: Estill Cotta, MD;  Location: [facility];  Service: Ophthalmology;  Laterality: Right;  [place] 01:28 AP% 23.4 CDE 36.14 fluid pack lot # [id number]   CATARACT EXTRACTION W/PHACO Left 07/28/2015   Procedure: CATARACT EXTRACTION PHACO AND INTRAOCULAR LENS PLACEMENT (IOC);  Surgeon: Estill Cotta, MD;  Location: [facility];  Service: Ophthalmology;  Laterality: Left;  [place]    1:29.7 AP%  24.9 CDE   41.32 fluid casette lot #[id number] H exp 09/23/2016   COONOSCOPY     AND ENDOSCOPY   DILATION AND CURETTAGE OF  UTERUS     ENDARTERECTOMY Left 11/13/2020   Procedure: Evacuation of left neck hematoma;  Surgeon: Katha Cabal, MD;  Location: [facility];  Service: Vascular;  Laterality: Left;   ENDARTERECTOMY Left 11/13/2020   Procedure: ENDARTERECTOMY CAROTID;  Surgeon: Algernon Huxley, MD;  Location: [facility];  Service: Vascular;  Laterality: Left;   TONSILLECTOMY     TUBAL LIGATION     Patient Active Problem List   Diagnosis Date Noted   Carotid stenosis, symptomatic, with infarction ([place]) 11/13/2020   Gout flare 11/10/2020   UTI (urinary tract infection) 11/10/2020   Left carotid artery stenosis 11/10/2020   Chronic kidney disease 11/10/2020   Left basal ganglia embolic stroke ([place]) 66/59/9357   PAC (premature atrial contraction)    Pre-procedural cardiovascular examination    Ischemic stroke ([place]) 10/20/2020   TIA (transient ischemic attack) 10/19/2020   Right hemiparesis ([place]) 10/19/2020   Type 2 diabetes mellitus with stage 3 chronic kidney disease, without long-term current use of insulin ([place]) 08/24/2018   Hyperlipidemia, unspecified 07/19/2017   Hypertension 07/19/2017   PUD (peptic ulcer disease) 07/19/2017   Type 2 diabetes mellitus ([place]) 07/19/2017   Personal history of gout 08/12/2016   Anemia, unspecified 04/09/2016   Hypothyroidism, acquired 12/10/2015    REFERRING DIAG: CVA  with right hemiparesis  THERAPY DIAG:  Difficulty in walking, not elsewhere classified  Muscle weakness (generalized)  Abnormality of gait and mobility  Unsteadiness on feet  Other lack of coordination  Rationale for Evaluation and Treatment Rehabilitation  PERTINENT HISTORY: 88 y.o. female with medical history significant for type 2 diabetes mellitus, essential hypertension, acquired hypothyroidism, hyperlipidemia, stage III chronic kidney disease reports increased right sided weakness on 10/19/20. She was diagnosed with left basal ganglia ischemic stroke. She was discharged to inpatient rehab for  10 days and then discharged home. Patient was evaluated by outpatient PT on 11/12/20. CVA was due to carotid stenosis. She underwent left carotid endartectomy on 7/21. Procedure went well however patient suffered from left cervical hematoma and had subsequent surgical procedure to stop the bleed and remove the hematoma on 11/13/20. They were concerned with her breathing and therefore intubated her for 1 day, she was in ICU for 2 days and transferred to med/surge on 11/15/20. Patient was discharged home on 11/17/20 and is now returning to outpatient PT. She has had the stiches removed and is healing well. Denies any neck pain or stiffness. She lives with her daughter and has 24/7 caregiver support. She is still using RW for most ambulation. She is able to transfer to bedside commode well and is able to stand short time unsupported. She reports feeling very weak and fatigued. She reports she has been dragging her right foot more. She denies any numbness/tingling; She reports feeling like her legs are wanting to do more but is hesitant because of fear of falling. She is mod I for self care morning routine. She does still receive help with showering. She has been working on increasing her activity but denies any formal exercise.   PRECAUTIONS: Fall risk  SUBJECTIVE:  Pt is planning on seeing MD tomorrow for her sugar due to not being able to get it below 200.  Otherwise pt is doing well and denies falls since last visit.   Pt arrived to the clinic late due to her great-granddaughter visiting her.   PAIN:  Are you having pain? No   TODAY'S TREATMENT: 03/02/2022  Gait belt donned, CGA-supervision  for safety unless otherwise noted  Neuro Re-Ed:   Ambulation 320f around gym without AD, CGA utilized for safety Dynamic stance on airex pad while playing hangman at whiteboard using cross body reaching, cognitive and balance related tasks necessary for improved functional stability, several rounds of this task.       TherEx:  NuStep, seat 6, arms 7, interval training for improved cardiovascular performance Level 2 - 1 min Level 3 - 1 min Level 2 - 1 min Level 3 - 1 min Level 2 - 1 min Level 1 - 1 min as cool-off period  BP taken at conclusion of interval training as well: BP: 176/56 HR: 134 bpm    Backwards ambulation for increased posterior muscle activation and improved activation of the posterior musculature.  X2 lengths with multiple LOB and needing mod-maxA to preven uncontrolled descent to the floor.      PATIENT EDUCATION: Education details: goal reassessment, indications, plan Person educated: Patient Education method: Explanation, Demonstration, and Verbal cues Education comprehension: verbalized understanding, returned demonstration, and needs further education   HOME EXERCISE PROGRAM: 02/04/22: seated RLE hip ABD with green TB, emphasis on eccentric control  Instructed patient in pool exercise: Access Code: [email or web address] Date: 10/22/2021 Prepared by: MBlanche East Exercises - Seated Knee Extension with Resistance  - 1  x daily - 7 x weekly - 2 sets - 15 reps - Forward and Backward Stepping at [employer]  - 1 x daily - 7 x weekly - 1 sets - 5 reps - Freestyle Swimming with Snorkel Mask  - 1 x daily - 7 x weekly - 1 sets - 2-3 reps - Heel Walking  - 1 x daily - 7 x weekly - 1 sets - 5 reps - Toe Walking  - 1 x daily - 7 x weekly - 1 sets - 5 reps - Single Leg Stance at Pool Wall  - 1 x daily - 7 x weekly - 1 sets - 3-5 reps - 15-30 sec hold   PT Short Term Goals -       PT SHORT TERM GOAL #1   Title Patient will be adherent to HEP at least 3x a week to improve functional strength and balance for better safety at home.    Baseline 9/21: Pt reports compliance with HEP and is confident, 1/3: doing exercise 2x a week; 06/25/21: 3x/week, 5/18: consistent; 8/1: consistent;    Time 4    Period Weeks    Status Achieved    Target  Date 06/25/21      PT SHORT TERM GOAL #2   Title Patient will be independent in transferring sit<>Stand without pushing on arm rests to improve ability to get up from chair.    Baseline 9/21: pt able to complete STS without use of UEs, 1/3:able to stand but has moderate difficulty requiring increased time; 06/25/21: able to perform indep without hands but requiring use of momentum to attain standing. Remains stable. 5/18: able to stand without UE assist, close supervision, remains stable;    Time 4    Period Weeks    Status Achieved    Target Date 07/23/21              PT Long Term Goals -      PT LONG TERM GOAL #1   Title Patient (> 61 years old) will complete five times sit to stand test in < 15 seconds indicating an increased LE strength and improved balance.    Baseline 9/21: 29 seconds hands-free 10/10: deferred 10/12: 34 sec hands free; 11/2: 23.8 sec hands-free; 12/7: 33.2 sec hands free, 1/3: 33.92 hands free; 06/25/21: 25.69 sec, 4/13: 15.5 sec hands free, 5/18: 23 sec without UE 6/22: deferred, 8/1: 15.7 sec without UE; 9/7: 15 sec without UE    Time 8    Period Weeks    Status Partially met   Target Date  05/11/2022     PT LONG TERM GOAL #2   Title Patient will increase six minute walk test distance to >400 feet for improve gait ability    Baseline 9/21: 368 ft with RW; 10/10: deferred 10/12: 408 ft; 11/2: 476 ft with 4WW, 1/3: 475 feet with RW; 4/13: 450 feet without AD CGA 10/15/21: 460 feet without AD, CGA   Time 8    Period Weeks    Status Achieved    Target Date 02/16/22     PT LONG TERM GOAL #3   Title Patient will increase 10 meter walk test to >1.83ms as to improve gait speed for better community ambulation and to reduce fall risk.    Baseline 9/21: 0.5 m/s with RW; 10/10 deferred 10/12: 0.93 m/s with 46OT 11/2: 0.83 m/s with 41XB 12/7: 0.52 m/s with RW, 1/3: 0.704; 06/25/21: 0.55 m/s with SPC. 5/18: 0.4 m/s without  AD , 10/15/21 0.507 m/s without AD, 8/1: 0.64 m/s;  9/7: 0.42 m/s with SPC, 0.43 m/s without AD   Time 8    Period Weeks    Status On going    Target Date 05/11/2022     PT LONG TERM GOAL #4   Title Patient will be independent with ascend/descend 6 steps using single UE in step over step pattern without LOB.    Baseline 9/21:  Patient uses PT stairs (4 steps) with bilateral upper extremity support on handrails for both ascending/descending.  Patient exhibits reciprocal pattern ascending and step to pattern descending. 10/10: deferred; 10/12: ascending/descending recip. steps with BUE support; 11/2: Ascended/descended 8 steps total, reciprocal pattern ascending and step-to descending. Pt uses UUE support throughout with CGA assist.; 12/7: Ascended and descended 8 steps total with UUE support and CGA. Reciprocal stepping ascending and step to descending., 1/3: Deferrred; 06/25/21: able to perform with SUE support and reciprocal pattern asc/desc, but does require close CGA with descending. 5/18: independent with 2 handrails, close supervision with 1 handrai; 10/15/21: requires 2 handrails, 8/1: requires 1 handrail, descends one step at a time; 9/7: uses 1 handrail, recip pattern ascending, able to do recip pattern descending but with one instance of LOB where pt self-corrects    Time 8    Period Weeks    Status Partially Met    Target Date 05/11/22     PT LONG TERM GOAL #5   Title Patient will demonstrate an improved Berg Balance Score of >45/56 as to demonstrate improved balance with ADLs such as sitting/standing and transfer balance and reduced fall risk.    Baseline 9/21: deferred d/t time; 9/29: 42/56; 10/10: deferred; 10/12: 44/56; 11/2: deferred; 11/7: 46/56, 1/3: deferred    Time 8    Period Weeks    Status Achieved    Target Date 12/17/21     PT LONG TERM GOAL #6   Title Patient will improve FOTO score to >60% to indicate improved functional mobility with ADLs.    Baseline 9/21: 59; 10/10: 57; 11/2: 60; 12/7: 58%, 1/3: 61%, 4/13: 56% 10/15/21:  58%, 8/1: 56%; 9/7: 67%   Time 8    Period Weeks    Status MET   Target Date 08/24/23w     PT LONG TERM GOAL #7   Title Pt will improve FGA by a minimum of 5 points to indicate clinically significant improvement in reduced risk of falls with walking tasks.    Baseline 06/25/21: 12, 4/13: deferred, 5/18: deferred due to increased ankle discomfort; 6/22: deferred due to elevated BP- will assess next visit; 10/20/21: 8/30, 8/1: 8/30; 9/7: 13/30   Time 8    Period Weeks    Status MET   Target Date 12/17/21   PT LONG TERM GOAL #8  Title Pt will improve FGA to >18/30 to indicate clinically significant improvement in reduced risk of falls with walking tasks and improved balance.  Baseline 9/7: 13/30  Time 7  Period Weeks   Status NEW  Target Date 05/11/21            Plan -    Clinical Impression Statement Treatment time limited due to pt arriving to the clinic late.  Pt able to tolerate interval training with lesser levels today, however directly affected her ambulation technique in the hallway.  Pt demonstrated increased risk of falling after interveal training due to fatigue and will continue to be monitored throughout the sessions going forward.   Pt will continue  to benefit from skilled therapy to address remaining deficits in order to improve overall QoL and return to PLOF.       Personal Factors and Comorbidities Age    Comorbidities HTN, CKD, fall risk, type 2 diabetes,    Examination-Activity Limitations Lift;Locomotion Level;Squat;Stairs;Stand;Toileting;Transfers;Bathing    Examination-Participation Restrictions Cleaning;Community Activity;Laundry;Meal Prep;Shop;Volunteer;Driving    Stability/Clinical Decision Making Stable/Uncomplicated    Rehab Potential Fair    PT Frequency 2x / week    PT Duration 8 weeks    PT Treatment/Interventions Cryotherapy;Electrical Stimulation;Moist Heat;Gait training;Stair training;Functional mobility training;Therapeutic activities;Therapeutic  exercise;Neuromuscular re-education;Balance training;Patient/family education;Orthotic Fit/Training;Energy conservation    PT Next Visit Plan Balance reactive strategies, interval walking training, stair training, glute strengthening   PT Home Exercise Plan No changes this date    Consulted and Agree with Plan of Care Patient             Gwenlyn Saran, PT, DPT Physical Therapist- [facility]  03/02/22, 9:46 PM

## 2022-03-02 ENCOUNTER — Ambulatory Visit: Payer: Medicare HMO | Admitting: Occupational Therapy

## 2022-03-02 ENCOUNTER — Ambulatory Visit: Payer: Medicare HMO

## 2022-03-02 DIAGNOSIS — R278 Other lack of coordination: Secondary | ICD-10-CM

## 2022-03-02 DIAGNOSIS — R2681 Unsteadiness on feet: Secondary | ICD-10-CM

## 2022-03-02 DIAGNOSIS — R262 Difficulty in walking, not elsewhere classified: Secondary | ICD-10-CM | POA: Diagnosis not present

## 2022-03-02 DIAGNOSIS — R269 Unspecified abnormalities of gait and mobility: Secondary | ICD-10-CM

## 2022-03-02 DIAGNOSIS — M6281 Muscle weakness (generalized): Secondary | ICD-10-CM

## 2022-03-04 ENCOUNTER — Ambulatory Visit: Payer: Medicare HMO

## 2022-03-04 ENCOUNTER — Ambulatory Visit: Payer: Medicare HMO | Admitting: Occupational Therapy

## 2022-03-04 DIAGNOSIS — R278 Other lack of coordination: Secondary | ICD-10-CM

## 2022-03-04 DIAGNOSIS — R269 Unspecified abnormalities of gait and mobility: Secondary | ICD-10-CM

## 2022-03-04 DIAGNOSIS — M6281 Muscle weakness (generalized): Secondary | ICD-10-CM

## 2022-03-04 DIAGNOSIS — R262 Difficulty in walking, not elsewhere classified: Secondary | ICD-10-CM | POA: Diagnosis not present

## 2022-03-04 DIAGNOSIS — R2681 Unsteadiness on feet: Secondary | ICD-10-CM

## 2022-03-04 NOTE — Therapy (Signed)
OUTPATIENT PHYSICAL THERAPY TREATMENT NOTE    Patient Name: Cassandra Schaefer MRN: [id number] DOB:[birth year], 88 y.o., female Today's Date: 03/04/2022  PCP: Roetta Sessions MD REFERRING PROVIDER: Roetta Sessions MD   PT End of Session - 03/04/22 1350     Visit Number 116    Number of Visits 125    Date for PT Re-Evaluation 05/11/22    Authorization Type [employer]    Authorization Time Period 02/16/22-05/11/22    Progress Note Due on Visit 120    PT Start Time 1345    PT Stop Time 1430    PT Time Calculation (min) 45 min    Equipment Utilized During Treatment Gait belt    Activity Tolerance Patient tolerated treatment well;No increased pain    Behavior During Therapy WFL for tasks assessed/performed                 Past Medical History:  Diagnosis Date   Anemia    Cough    RESOLVING, NO FEVER   Diabetes mellitus without complication ([place])    Gout    Hypertension    Hypothyroidism    PUD (peptic ulcer disease)    Stroke [facility])    Wears dentures    full upper, partial lower   Past Surgical History:  Procedure Laterality Date   AMPUTATION TOE Left 12/14/2017   Procedure: AMPUTATION TOE-4TH MPJ;  Surgeon: Samara Deist, DPM;  Location: [place];  Service: Podiatry;  Laterality: Left;  IVA LOCAL Diabetic - oral meds   APPENDECTOMY     CATARACT EXTRACTION W/PHACO Right 06/23/2015   Procedure: CATARACT EXTRACTION PHACO AND INTRAOCULAR LENS PLACEMENT (IOC);  Surgeon: Estill Cotta, MD;  Location: [facility];  Service: Ophthalmology;  Laterality: Right;  [place] 01:28 AP% 23.4 CDE 36.14 fluid pack lot # [id number]   CATARACT EXTRACTION W/PHACO Left 07/28/2015   Procedure: CATARACT EXTRACTION PHACO AND INTRAOCULAR LENS PLACEMENT (IOC);  Surgeon: Estill Cotta, MD;  Location: [facility];  Service: Ophthalmology;  Laterality: Left;  [place]    1:29.7 AP%  24.9 CDE   41.32 fluid casette lot #[id number] H exp 09/23/2016   COONOSCOPY     AND ENDOSCOPY   DILATION AND CURETTAGE OF  UTERUS     ENDARTERECTOMY Left 11/13/2020   Procedure: Evacuation of left neck hematoma;  Surgeon: Katha Cabal, MD;  Location: [facility];  Service: Vascular;  Laterality: Left;   ENDARTERECTOMY Left 11/13/2020   Procedure: ENDARTERECTOMY CAROTID;  Surgeon: Algernon Huxley, MD;  Location: [facility];  Service: Vascular;  Laterality: Left;   TONSILLECTOMY     TUBAL LIGATION     Patient Active Problem List   Diagnosis Date Noted   Carotid stenosis, symptomatic, with infarction ([place]) 11/13/2020   Gout flare 11/10/2020   UTI (urinary tract infection) 11/10/2020   Left carotid artery stenosis 11/10/2020   Chronic kidney disease 11/10/2020   Left basal ganglia embolic stroke ([place]) 17/49/4496   PAC (premature atrial contraction)    Pre-procedural cardiovascular examination    Ischemic stroke ([place]) 10/20/2020   TIA (transient ischemic attack) 10/19/2020   Right hemiparesis ([place]) 10/19/2020   Type 2 diabetes mellitus with stage 3 chronic kidney disease, without long-term current use of insulin ([place]) 08/24/2018   Hyperlipidemia, unspecified 07/19/2017   Hypertension 07/19/2017   PUD (peptic ulcer disease) 07/19/2017   Type 2 diabetes mellitus ([place]) 07/19/2017   Personal history of gout 08/12/2016   Anemia, unspecified 04/09/2016   Hypothyroidism, acquired 12/10/2015    REFERRING DIAG:  CVA with right hemiparesis  THERAPY DIAG:  Difficulty in walking, not elsewhere classified  Muscle weakness (generalized)  Abnormality of gait and mobility  Unsteadiness on feet  Other lack of coordination  Rationale for Evaluation and Treatment Rehabilitation  PERTINENT HISTORY: 88 y.o. female with medical history significant for type 2 diabetes mellitus, essential hypertension, acquired hypothyroidism, hyperlipidemia, stage III chronic kidney disease reports increased right sided weakness on 10/19/20. She was diagnosed with left basal ganglia ischemic stroke. She was discharged to inpatient rehab for  10 days and then discharged home. Patient was evaluated by outpatient PT on 11/12/20. CVA was due to carotid stenosis. She underwent left carotid endartectomy on 7/21. Procedure went well however patient suffered from left cervical hematoma and had subsequent surgical procedure to stop the bleed and remove the hematoma on 11/13/20. They were concerned with her breathing and therefore intubated her for 1 day, she was in ICU for 2 days and transferred to med/surge on 11/15/20. Patient was discharged home on 11/17/20 and is now returning to outpatient PT. She has had the stiches removed and is healing well. Denies any neck pain or stiffness. She lives with her daughter and has 24/7 caregiver support. She is still using RW for most ambulation. She is able to transfer to bedside commode well and is able to stand short time unsupported. She reports feeling very weak and fatigued. She reports she has been dragging her right foot more. She denies any numbness/tingling; She reports feeling like her legs are wanting to do more but is hesitant because of fear of falling. She is mod I for self care morning routine. She does still receive help with showering. She has been working on increasing her activity but denies any formal exercise.   PRECAUTIONS: Fall risk  SUBJECTIVE:   Pt notes that she had a good visit with her MD and was able to get a new glucose monitor and is currently planning on picking it up from the drug store later today.  Pt also received increase medication for glucose control.  Pt states she started it last night, but was advised to wait until Monday to see if there has been any improvements.   PAIN:  Are you having pain? No   TODAY'S TREATMENT: 03/04/2022    Gait belt donned, CGA-supervision  for safety unless otherwise noted  Neuro Re-Ed:   Dynamic stance on airex pad while shooting basketballs using cross body reaching, balance related tasks necessary for improved functional stability,  several rounds of this task, x3 baskets of balls.  Backwards ambulation in gym necessary for improved balance and activation of posterior chain musculature, 89f x4     TherEx:  Calf stretch on slant board, 30 sec bouts x2 each LE      PATIENT EDUCATION: Education details: goal reassessment, indications, plan Person educated: Patient Education method: Explanation, Demonstration, and Verbal cues Education comprehension: verbalized understanding, returned demonstration, and needs further education   HOME EXERCISE PROGRAM: 02/04/22: seated RLE hip ABD with green TB, emphasis on eccentric control  Instructed patient in pool exercise: Access Code: [email or web address] Date: 10/22/2021 Prepared by: MBlanche East Exercises - Seated Knee Extension with Resistance  - 1 x daily - 7 x weekly - 2 sets - 15 reps - Forward and Backward Stepping at [employer] - 1 x daily - 7 x weekly - 1 sets - 5 reps - Freestyle Swimming with Snorkel Mask  - 1 x daily - 7 x weekly -  1 sets - 2-3 reps - Heel Walking  - 1 x daily - 7 x weekly - 1 sets - 5 reps - Toe Walking  - 1 x daily - 7 x weekly - 1 sets - 5 reps - Single Leg Stance at Pool Wall  - 1 x daily - 7 x weekly - 1 sets - 3-5 reps - 15-30 sec hold   PT Short Term Goals -       PT SHORT TERM GOAL #1   Title Patient will be adherent to HEP at least 3x a week to improve functional strength and balance for better safety at home.    Baseline 9/21: Pt reports compliance with HEP and is confident, 1/3: doing exercise 2x a week; 06/25/21: 3x/week, 5/18: consistent; 8/1: consistent;    Time 4    Period Weeks    Status Achieved    Target Date 06/25/21      PT SHORT TERM GOAL #2   Title Patient will be independent in transferring sit<>Stand without pushing on arm rests to improve ability to get up from chair.    Baseline 9/21: pt able to complete STS without use of UEs, 1/3:able to stand but has moderate difficulty  requiring increased time; 06/25/21: able to perform indep without hands but requiring use of momentum to attain standing. Remains stable. 5/18: able to stand without UE assist, close supervision, remains stable;    Time 4    Period Weeks    Status Achieved    Target Date 07/23/21              PT Long Term Goals -      PT LONG TERM GOAL #1   Title Patient (> 65 years old) will complete five times sit to stand test in < 15 seconds indicating an increased LE strength and improved balance.    Baseline 9/21: 29 seconds hands-free 10/10: deferred 10/12: 34 sec hands free; 11/2: 23.8 sec hands-free; 12/7: 33.2 sec hands free, 1/3: 33.92 hands free; 06/25/21: 25.69 sec, 4/13: 15.5 sec hands free, 5/18: 23 sec without UE 6/22: deferred, 8/1: 15.7 sec without UE; 9/7: 15 sec without UE    Time 8    Period Weeks    Status Partially met   Target Date  05/11/2022     PT LONG TERM GOAL #2   Title Patient will increase six minute walk test distance to >400 feet for improve gait ability    Baseline 9/21: 368 ft with RW; 10/10: deferred 10/12: 408 ft; 11/2: 476 ft with 4WW, 1/3: 475 feet with RW; 4/13: 450 feet without AD CGA 10/15/21: 460 feet without AD, CGA   Time 8    Period Weeks    Status Achieved    Target Date 02/16/22     PT LONG TERM GOAL #3   Title Patient will increase 10 meter walk test to >1.45ms as to improve gait speed for better community ambulation and to reduce fall risk.    Baseline 9/21: 0.5 m/s with RW; 10/10 deferred 10/12: 0.93 m/s with 49YI 11/2: 0.83 m/s with 40XK 12/7: 0.52 m/s with RW, 1/3: 0.704; 06/25/21: 0.55 m/s with SPC. 5/18: 0.4 m/s without AD , 10/15/21 0.507 m/s without AD, 8/1: 0.64 m/s; 9/7: 0.42 m/s with SPC, 0.43 m/s without AD   Time 8    Period Weeks    Status On going    Target Date 05/11/2022     PT LONG TERM GOAL #4  Title Patient will be independent with ascend/descend 6 steps using single UE in step over step pattern without LOB.    Baseline 9/21:   Patient uses PT stairs (4 steps) with bilateral upper extremity support on handrails for both ascending/descending.  Patient exhibits reciprocal pattern ascending and step to pattern descending. 10/10: deferred; 10/12: ascending/descending recip. steps with BUE support; 11/2: Ascended/descended 8 steps total, reciprocal pattern ascending and step-to descending. Pt uses UUE support throughout with CGA assist.; 12/7: Ascended and descended 8 steps total with UUE support and CGA. Reciprocal stepping ascending and step to descending., 1/3: Deferrred; 06/25/21: able to perform with SUE support and reciprocal pattern asc/desc, but does require close CGA with descending. 5/18: independent with 2 handrails, close supervision with 1 handrai; 10/15/21: requires 2 handrails, 8/1: requires 1 handrail, descends one step at a time; 9/7: uses 1 handrail, recip pattern ascending, able to do recip pattern descending but with one instance of LOB where pt self-corrects    Time 8    Period Weeks    Status Partially Met    Target Date 05/11/22     PT LONG TERM GOAL #5   Title Patient will demonstrate an improved Berg Balance Score of >45/56 as to demonstrate improved balance with ADLs such as sitting/standing and transfer balance and reduced fall risk.    Baseline 9/21: deferred d/t time; 9/29: 42/56; 10/10: deferred; 10/12: 44/56; 11/2: deferred; 11/7: 46/56, 1/3: deferred    Time 8    Period Weeks    Status Achieved    Target Date 12/17/21     PT LONG TERM GOAL #6   Title Patient will improve FOTO score to >60% to indicate improved functional mobility with ADLs.    Baseline 9/21: 59; 10/10: 57; 11/2: 60; 12/7: 58%, 1/3: 61%, 4/13: 56% 10/15/21: 58%, 8/1: 56%; 9/7: 67%   Time 8    Period Weeks    Status MET   Target Date 08/24/23w     PT LONG TERM GOAL #7   Title Pt will improve FGA by a minimum of 5 points to indicate clinically significant improvement in reduced risk of falls with walking tasks.    Baseline 06/25/21:  12, 4/13: deferred, 5/18: deferred due to increased ankle discomfort; 6/22: deferred due to elevated BP- will assess next visit; 10/20/21: 8/30, 8/1: 8/30; 9/7: 13/30   Time 8    Period Weeks    Status MET   Target Date 12/17/21   PT LONG TERM GOAL #8  Title Pt will improve FGA to >18/30 to indicate clinically significant improvement in reduced risk of falls with walking tasks and improved balance.  Baseline 9/7: 13/30  Time 7  Period Weeks   Status NEW  Target Date 05/11/21            Plan -    Clinical Impression Statement Pt performed well with the exercises given today and enjoyed being able to play basketball.  Pt noted it challenged her balance while standing on the airex pad and she had fun doing it.  Pt improved technique with the cross body reaching and was able to tolerate lengthy standing without UE support.   Pt will continue to benefit from skilled therapy to address remaining deficits in order to improve overall QoL and return to PLOF.       Personal Factors and Comorbidities Age    Comorbidities HTN, CKD, fall risk, type 2 diabetes,    Examination-Activity Limitations Lift;Locomotion Level;Squat;Stairs;Stand;Toileting;Transfers;Bathing    Examination-Participation Restrictions  Cleaning;Community Activity;Laundry;Meal Prep;Shop;Volunteer;Driving    Stability/Clinical Decision Making Stable/Uncomplicated    Rehab Potential Fair    PT Frequency 2x / week    PT Duration 8 weeks    PT Treatment/Interventions Cryotherapy;Electrical Stimulation;Moist Heat;Gait training;Stair training;Functional mobility training;Therapeutic activities;Therapeutic exercise;Neuromuscular re-education;Balance training;Patient/family education;Orthotic Fit/Training;Energy conservation    PT Next Visit Plan Balance reactive strategies, interval walking training, stair training, glute strengthening   PT Home Exercise Plan No changes this date    Consulted and Agree with Plan of Care Patient              Gwenlyn Saran, PT, DPT Physical Therapist- [facility]  03/04/22, 3:50 PM

## 2022-03-09 ENCOUNTER — Ambulatory Visit: Payer: Medicare HMO | Admitting: Occupational Therapy

## 2022-03-09 ENCOUNTER — Ambulatory Visit: Payer: Medicare HMO

## 2022-03-09 DIAGNOSIS — R262 Difficulty in walking, not elsewhere classified: Secondary | ICD-10-CM

## 2022-03-09 DIAGNOSIS — M6281 Muscle weakness (generalized): Secondary | ICD-10-CM

## 2022-03-09 DIAGNOSIS — R269 Unspecified abnormalities of gait and mobility: Secondary | ICD-10-CM

## 2022-03-09 DIAGNOSIS — R2681 Unsteadiness on feet: Secondary | ICD-10-CM

## 2022-03-09 NOTE — Therapy (Signed)
OUTPATIENT PHYSICAL THERAPY TREATMENT NOTE    Patient Name: Cassandra Schaefer MRN: [id number] DOB:1928/12/27, 88 y.o., female Today's Date: 03/09/2022  PCP: Roetta Sessions MD REFERRING PROVIDER: Roetta Sessions MD   PT End of Session - 03/09/22 1429     Visit Number 117    Number of Visits 125    Date for PT Re-Evaluation 05/11/22    Authorization Type [employer]    Authorization Time Period 02/16/22-05/11/22    Progress Note Due on Visit 120    PT Start Time 1430    PT Stop Time 1515    PT Time Calculation (min) 45 min    Equipment Utilized During Treatment Gait belt    Activity Tolerance Patient tolerated treatment well;No increased pain    Behavior During Therapy WFL for tasks assessed/performed              Past Medical History:  Diagnosis Date   Anemia    Cough    RESOLVING, NO FEVER   Diabetes mellitus without complication ([place])    Gout    Hypertension    Hypothyroidism    PUD (peptic ulcer disease)    Stroke [facility])    Wears dentures    full upper, partial lower   Past Surgical History:  Procedure Laterality Date   AMPUTATION TOE Left 12/14/2017   Procedure: AMPUTATION TOE-4TH MPJ;  Surgeon: Samara Deist, DPM;  Location: [place];  Service: Podiatry;  Laterality: Left;  IVA LOCAL Diabetic - oral meds   APPENDECTOMY     CATARACT EXTRACTION W/PHACO Right 06/23/2015   Procedure: CATARACT EXTRACTION PHACO AND INTRAOCULAR LENS PLACEMENT (IOC);  Surgeon: Estill Cotta, MD;  Location: [facility];  Service: Ophthalmology;  Laterality: Right;  [place] 01:28 AP% 23.4 CDE 36.14 fluid pack lot # [id number]   CATARACT EXTRACTION W/PHACO Left 07/28/2015   Procedure: CATARACT EXTRACTION PHACO AND INTRAOCULAR LENS PLACEMENT (IOC);  Surgeon: Estill Cotta, MD;  Location: [facility];  Service: Ophthalmology;  Laterality: Left;  [place]    1:29.7 AP%  24.9 CDE   41.32 fluid casette lot #[id number] H exp 09/23/2016   COONOSCOPY     AND ENDOSCOPY   DILATION AND CURETTAGE OF UTERUS      ENDARTERECTOMY Left 11/13/2020   Procedure: Evacuation of left neck hematoma;  Surgeon: Katha Cabal, MD;  Location: [facility];  Service: Vascular;  Laterality: Left;   ENDARTERECTOMY Left 11/13/2020   Procedure: ENDARTERECTOMY CAROTID;  Surgeon: Algernon Huxley, MD;  Location: [facility];  Service: Vascular;  Laterality: Left;   TONSILLECTOMY     TUBAL LIGATION     Patient Active Problem List   Diagnosis Date Noted   Carotid stenosis, symptomatic, with infarction ([place]) 11/13/2020   Gout flare 11/10/2020   UTI (urinary tract infection) 11/10/2020   Left carotid artery stenosis 11/10/2020   Chronic kidney disease 11/10/2020   Left basal ganglia embolic stroke ([place]) 01/75/1025   PAC (premature atrial contraction)    Pre-procedural cardiovascular examination    Ischemic stroke ([place]) 10/20/2020   TIA (transient ischemic attack) 10/19/2020   Right hemiparesis ([place]) 10/19/2020   Type 2 diabetes mellitus with stage 3 chronic kidney disease, without long-term current use of insulin ([place]) 08/24/2018   Hyperlipidemia, unspecified 07/19/2017   Hypertension 07/19/2017   PUD (peptic ulcer disease) 07/19/2017   Type 2 diabetes mellitus ([place]) 07/19/2017   Personal history of gout 08/12/2016   Anemia, unspecified 04/09/2016   Hypothyroidism, acquired 12/10/2015    REFERRING DIAG: CVA with right  hemiparesis  THERAPY DIAG:  Difficulty in walking, not elsewhere classified  Muscle weakness (generalized)  Abnormality of gait and mobility  Unsteadiness on feet  Rationale for Evaluation and Treatment Rehabilitation  PERTINENT HISTORY: 88 y.o. female with medical history significant for type 2 diabetes mellitus, essential hypertension, acquired hypothyroidism, hyperlipidemia, stage III chronic kidney disease reports increased right sided weakness on 10/19/20. She was diagnosed with left basal ganglia ischemic stroke. She was discharged to inpatient rehab for 10 days and then discharged home.  Patient was evaluated by outpatient PT on 11/12/20. CVA was due to carotid stenosis. She underwent left carotid endartectomy on 7/21. Procedure went well however patient suffered from left cervical hematoma and had subsequent surgical procedure to stop the bleed and remove the hematoma on 11/13/20. They were concerned with her breathing and therefore intubated her for 1 day, she was in ICU for 2 days and transferred to med/surge on 11/15/20. Patient was discharged home on 11/17/20 and is now returning to outpatient PT. She has had the stiches removed and is healing well. Denies any neck pain or stiffness. She lives with her daughter and has 24/7 caregiver support. She is still using RW for most ambulation. She is able to transfer to bedside commode well and is able to stand short time unsupported. She reports feeling very weak and fatigued. She reports she has been dragging her right foot more. She denies any numbness/tingling; She reports feeling like her legs are wanting to do more but is hesitant because of fear of falling. She is mod I for self care morning routine. She does still receive help with showering. She has been working on increasing her activity but denies any formal exercise.   PRECAUTIONS: Fall risk  SUBJECTIVE:   Pt reports she is doing better and her gout has decreased since icing and elevating her leg.  Pt is still waiting on her new glucose meter, should be in on Thursday.  She is looking forward to being able to test it more often.   PAIN:  Are you having pain? No   TODAY'S TREATMENT: 03/09/2022  Gait belt donned, CGA-supervision  for safety unless otherwise noted  Neuro Re-Ed:     Backwards ambulation in gym necessary for improved balance and activation of posterior chain musculature, 73f x4   Obstacle course including stepping onto hedgehogs, step onto 6" step over down, narrow walking between 2 airex long pads, wobble board rocking forward, ball toss into bin, and soccer  kicks between chair legs, x3 laps   TherEx:  Calf stretch on slant board, 30 sec bouts x2 each LE  Pt performed on first setting, second setting, and ended on the third and final setting    PATIENT EDUCATION: Education details: goal reassessment, indications, plan Person educated: Patient Education method: Explanation, Demonstration, and Verbal cues Education comprehension: verbalized understanding, returned demonstration, and needs further education   HOME EXERCISE PROGRAM: 02/04/22: seated RLE hip ABD with green TB, emphasis on eccentric control  Instructed patient in pool exercise: Access Code: [email or web address] Date: 10/22/2021 Prepared by: MBlanche East Exercises - Seated Knee Extension with Resistance  - 1 x daily - 7 x weekly - 2 sets - 15 reps - Forward and Backward Stepping at [employer] - 1 x daily - 7 x weekly - 1 sets - 5 reps - Freestyle Swimming with Snorkel Mask  - 1 x daily - 7 x weekly - 1 sets - 2-3 reps - Heel Walking  -  1 x daily - 7 x weekly - 1 sets - 5 reps - Toe Walking  - 1 x daily - 7 x weekly - 1 sets - 5 reps - Single Leg Stance at Pool Wall  - 1 x daily - 7 x weekly - 1 sets - 3-5 reps - 15-30 sec hold   PT Short Term Goals -       PT SHORT TERM GOAL #1   Title Patient will be adherent to HEP at least 3x a week to improve functional strength and balance for better safety at home.    Baseline 9/21: Pt reports compliance with HEP and is confident, 1/3: doing exercise 2x a week; 06/25/21: 3x/week, 5/18: consistent; 8/1: consistent;    Time 4    Period Weeks    Status Achieved    Target Date 06/25/21      PT SHORT TERM GOAL #2   Title Patient will be independent in transferring sit<>Stand without pushing on arm rests to improve ability to get up from chair.    Baseline 9/21: pt able to complete STS without use of UEs, 1/3:able to stand but has moderate difficulty requiring increased time; 06/25/21: able to perform  indep without hands but requiring use of momentum to attain standing. Remains stable. 5/18: able to stand without UE assist, close supervision, remains stable;    Time 4    Period Weeks    Status Achieved    Target Date 07/23/21              PT Long Term Goals -      PT LONG TERM GOAL #1   Title Patient (> 64 years old) will complete five times sit to stand test in < 15 seconds indicating an increased LE strength and improved balance.    Baseline 9/21: 29 seconds hands-free 10/10: deferred 10/12: 34 sec hands free; 11/2: 23.8 sec hands-free; 12/7: 33.2 sec hands free, 1/3: 33.92 hands free; 06/25/21: 25.69 sec, 4/13: 15.5 sec hands free, 5/18: 23 sec without UE 6/22: deferred, 8/1: 15.7 sec without UE; 9/7: 15 sec without UE    Time 8    Period Weeks    Status Partially met   Target Date  05/11/2022     PT LONG TERM GOAL #2   Title Patient will increase six minute walk test distance to >400 feet for improve gait ability    Baseline 9/21: 368 ft with RW; 10/10: deferred 10/12: 408 ft; 11/2: 476 ft with 4WW, 1/3: 475 feet with RW; 4/13: 450 feet without AD CGA 10/15/21: 460 feet without AD, CGA   Time 8    Period Weeks    Status Achieved    Target Date 02/16/22     PT LONG TERM GOAL #3   Title Patient will increase 10 meter walk test to >1.46ms as to improve gait speed for better community ambulation and to reduce fall risk.    Baseline 9/21: 0.5 m/s with RW; 10/10 deferred 10/12: 0.93 m/s with 40JW 11/2: 0.83 m/s with 41XB 12/7: 0.52 m/s with RW, 1/3: 0.704; 06/25/21: 0.55 m/s with SPC. 5/18: 0.4 m/s without AD , 10/15/21 0.507 m/s without AD, 8/1: 0.64 m/s; 9/7: 0.42 m/s with SPC, 0.43 m/s without AD   Time 8    Period Weeks    Status On going    Target Date 05/11/2022     PT LONG TERM GOAL #4   Title Patient will be independent with ascend/descend 6 steps using  single UE in step over step pattern without LOB.    Baseline 9/21:  Patient uses PT stairs (4 steps) with bilateral upper  extremity support on handrails for both ascending/descending.  Patient exhibits reciprocal pattern ascending and step to pattern descending. 10/10: deferred; 10/12: ascending/descending recip. steps with BUE support; 11/2: Ascended/descended 8 steps total, reciprocal pattern ascending and step-to descending. Pt uses UUE support throughout with CGA assist.; 12/7: Ascended and descended 8 steps total with UUE support and CGA. Reciprocal stepping ascending and step to descending., 1/3: Deferrred; 06/25/21: able to perform with SUE support and reciprocal pattern asc/desc, but does require close CGA with descending. 5/18: independent with 2 handrails, close supervision with 1 handrai; 10/15/21: requires 2 handrails, 8/1: requires 1 handrail, descends one step at a time; 9/7: uses 1 handrail, recip pattern ascending, able to do recip pattern descending but with one instance of LOB where pt self-corrects    Time 8    Period Weeks    Status Partially Met    Target Date 05/11/22     PT LONG TERM GOAL #5   Title Patient will demonstrate an improved Berg Balance Score of >45/56 as to demonstrate improved balance with ADLs such as sitting/standing and transfer balance and reduced fall risk.    Baseline 9/21: deferred d/t time; 9/29: 42/56; 10/10: deferred; 10/12: 44/56; 11/2: deferred; 11/7: 46/56, 1/3: deferred    Time 8    Period Weeks    Status Achieved    Target Date 12/17/21     PT LONG TERM GOAL #6   Title Patient will improve FOTO score to >60% to indicate improved functional mobility with ADLs.    Baseline 9/21: 59; 10/10: 57; 11/2: 60; 12/7: 58%, 1/3: 61%, 4/13: 56% 10/15/21: 58%, 8/1: 56%; 9/7: 67%   Time 8    Period Weeks    Status MET   Target Date 08/24/23w     PT LONG TERM GOAL #7   Title Pt will improve FGA by a minimum of 5 points to indicate clinically significant improvement in reduced risk of falls with walking tasks.    Baseline 06/25/21: 12, 4/13: deferred, 5/18: deferred due to increased  ankle discomfort; 6/22: deferred due to elevated BP- will assess next visit; 10/20/21: 8/30, 8/1: 8/30; 9/7: 13/30   Time 8    Period Weeks    Status MET   Target Date 12/17/21   PT LONG TERM GOAL #8  Title Pt will improve FGA to >18/30 to indicate clinically significant improvement in reduced risk of falls with walking tasks and improved balance.  Baseline 9/7: 13/30  Time 7  Period Weeks   Status NEW  Target Date 05/11/21            Plan -    Clinical Impression Statement Pt requested to continue with balance training in an obstacle course during treatment session.  Pt has most difficulty with stepping on the hedgehogs, but is able to progressively perform the rest of the obstacle course with min-modA at times.  Pt noted to be fatigued at the end of the session due to working so hard with the obstacle course.   Pt will continue to benefit from skilled therapy to address remaining deficits in order to improve overall QoL and return to PLOF.      Personal Factors and Comorbidities Age    Comorbidities HTN, CKD, fall risk, type 2 diabetes,    Examination-Activity Limitations Lift;Locomotion Level;Squat;Stairs;Stand;Toileting;Transfers;Bathing    Examination-Participation Restrictions Cleaning;Community Activity;Laundry;Meal Prep;Shop;Volunteer;Driving  Stability/Clinical Decision Making Stable/Uncomplicated    Rehab Potential Fair    PT Frequency 2x / week    PT Duration 8 weeks    PT Treatment/Interventions Cryotherapy;Electrical Stimulation;Moist Heat;Gait training;Stair training;Functional mobility training;Therapeutic activities;Therapeutic exercise;Neuromuscular re-education;Balance training;Patient/family education;Orthotic Fit/Training;Energy conservation    PT Next Visit Plan Balance reactive strategies, interval walking training, stair training, glute strengthening   PT Home Exercise Plan No changes this date    Consulted and Agree with Plan of Care Patient              Gwenlyn Saran, PT, DPT Physical Therapist- [facility]  03/09/22, 5:17 PM

## 2022-03-11 ENCOUNTER — Ambulatory Visit: Payer: Medicare HMO

## 2022-03-11 ENCOUNTER — Ambulatory Visit: Payer: Medicare HMO | Admitting: Occupational Therapy

## 2022-03-16 ENCOUNTER — Ambulatory Visit: Payer: Medicare HMO

## 2022-03-16 ENCOUNTER — Ambulatory Visit: Payer: Medicare HMO | Admitting: Occupational Therapy

## 2022-03-16 ENCOUNTER — Other Ambulatory Visit: Payer: Self-pay

## 2022-03-16 DIAGNOSIS — R262 Difficulty in walking, not elsewhere classified: Secondary | ICD-10-CM

## 2022-03-16 DIAGNOSIS — R278 Other lack of coordination: Secondary | ICD-10-CM

## 2022-03-16 DIAGNOSIS — R2681 Unsteadiness on feet: Secondary | ICD-10-CM

## 2022-03-16 DIAGNOSIS — R482 Apraxia: Secondary | ICD-10-CM

## 2022-03-16 DIAGNOSIS — M6281 Muscle weakness (generalized): Secondary | ICD-10-CM

## 2022-03-16 DIAGNOSIS — R269 Unspecified abnormalities of gait and mobility: Secondary | ICD-10-CM

## 2022-03-16 NOTE — Therapy (Signed)
OUTPATIENT PHYSICAL THERAPY TREATMENT NOTE    Patient Name: Cassandra Schaefer MRN: [id number] DOB:12-20-1928, 88 y.o., female Today's Date: 03/16/2022  PCP: Roetta Sessions MD REFERRING PROVIDER: Roetta Sessions MD   PT End of Session - 03/16/22 1345     Visit Number 118    Number of Visits 125    Date for PT Re-Evaluation 05/11/22    Authorization Type [employer]    Authorization Time Period 02/16/22-05/11/22    Progress Note Due on Visit 120    PT Start Time 1345    PT Stop Time 1430    PT Time Calculation (min) 45 min    Equipment Utilized During Treatment Gait belt    Activity Tolerance Patient tolerated treatment well;No increased pain    Behavior During Therapy WFL for tasks assessed/performed               Past Medical History:  Diagnosis Date   Anemia    Cough    RESOLVING, NO FEVER   Diabetes mellitus without complication ([place])    Gout    Hypertension    Hypothyroidism    PUD (peptic ulcer disease)    Stroke [facility])    Wears dentures    full upper, partial lower   Past Surgical History:  Procedure Laterality Date   AMPUTATION TOE Left 12/14/2017   Procedure: AMPUTATION TOE-4TH MPJ;  Surgeon: Samara Deist, DPM;  Location: [place];  Service: Podiatry;  Laterality: Left;  IVA LOCAL Diabetic - oral meds   APPENDECTOMY     CATARACT EXTRACTION W/PHACO Right 06/23/2015   Procedure: CATARACT EXTRACTION PHACO AND INTRAOCULAR LENS PLACEMENT (IOC);  Surgeon: Estill Cotta, MD;  Location: [facility];  Service: Ophthalmology;  Laterality: Right;  [place] 01:28 AP% 23.4 CDE 36.14 fluid pack lot # [id number]   CATARACT EXTRACTION W/PHACO Left 07/28/2015   Procedure: CATARACT EXTRACTION PHACO AND INTRAOCULAR LENS PLACEMENT (IOC);  Surgeon: Estill Cotta, MD;  Location: [facility];  Service: Ophthalmology;  Laterality: Left;  [place]    1:29.7 AP%  24.9 CDE   41.32 fluid casette lot #[id number] H exp 09/23/2016   COONOSCOPY     AND ENDOSCOPY   DILATION AND CURETTAGE OF  UTERUS     ENDARTERECTOMY Left 11/13/2020   Procedure: Evacuation of left neck hematoma;  Surgeon: Katha Cabal, MD;  Location: [facility];  Service: Vascular;  Laterality: Left;   ENDARTERECTOMY Left 11/13/2020   Procedure: ENDARTERECTOMY CAROTID;  Surgeon: Algernon Huxley, MD;  Location: [facility];  Service: Vascular;  Laterality: Left;   TONSILLECTOMY     TUBAL LIGATION     Patient Active Problem List   Diagnosis Date Noted   Carotid stenosis, symptomatic, with infarction ([place]) 11/13/2020   Gout flare 11/10/2020   UTI (urinary tract infection) 11/10/2020   Left carotid artery stenosis 11/10/2020   Chronic kidney disease 11/10/2020   Left basal ganglia embolic stroke ([place]) 67/59/1638   PAC (premature atrial contraction)    Pre-procedural cardiovascular examination    Ischemic stroke ([place]) 10/20/2020   TIA (transient ischemic attack) 10/19/2020   Right hemiparesis ([place]) 10/19/2020   Type 2 diabetes mellitus with stage 3 chronic kidney disease, without long-term current use of insulin ([place]) 08/24/2018   Hyperlipidemia, unspecified 07/19/2017   Hypertension 07/19/2017   PUD (peptic ulcer disease) 07/19/2017   Type 2 diabetes mellitus ([place]) 07/19/2017   Personal history of gout 08/12/2016   Anemia, unspecified 04/09/2016   Hypothyroidism, acquired 12/10/2015    REFERRING DIAG: CVA with  right hemiparesis  THERAPY DIAG:  Difficulty in walking, not elsewhere classified  Muscle weakness (generalized)  Abnormality of gait and mobility  Unsteadiness on feet  Other lack of coordination  Apraxia  Rationale for Evaluation and Treatment Rehabilitation  PERTINENT HISTORY: 88 y.o. female with medical history significant for type 2 diabetes mellitus, essential hypertension, acquired hypothyroidism, hyperlipidemia, stage III chronic kidney disease reports increased right sided weakness on 10/19/20. She was diagnosed with left basal ganglia ischemic stroke. She was discharged to inpatient  rehab for 10 days and then discharged home. Patient was evaluated by outpatient PT on 11/12/20. CVA was due to carotid stenosis. She underwent left carotid endartectomy on 7/21. Procedure went well however patient suffered from left cervical hematoma and had subsequent surgical procedure to stop the bleed and remove the hematoma on 11/13/20. They were concerned with her breathing and therefore intubated her for 1 day, she was in ICU for 2 days and transferred to med/surge on 11/15/20. Patient was discharged home on 11/17/20 and is now returning to outpatient PT. She has had the stiches removed and is healing well. Denies any neck pain or stiffness. She lives with her daughter and has 24/7 caregiver support. She is still using RW for most ambulation. She is able to transfer to bedside commode well and is able to stand short time unsupported. She reports feeling very weak and fatigued. She reports she has been dragging her right foot more. She denies any numbness/tingling; She reports feeling like her legs are wanting to do more but is hesitant because of fear of falling. She is mod I for self care morning routine. She does still receive help with showering. She has been working on increasing her activity but denies any formal exercise.   PRECAUTIONS: Fall risk  SUBJECTIVE:   Pt states she felt like playing hookie today, but decided against it.  Pt notes that she was unable to stay last visit due to her family member needing to leave to pick up their daughter.  Pt notes she is otherwise feeling pretty good.   PAIN:  Are you having pain? No   TODAY'S TREATMENT: 03/16/2022  Gait belt donned, CGA-supervision  for safety unless otherwise noted  Neuro Re-Ed:  Backwards ambulation in gym necessary for improved balance and activation of posterior chain musculature, 69f x4  KoreBalance TuxRacer 3 races with focus on weight shifts and improved foot eye coordination KoreBalance NeverBall, 5 levels totally  with continued focus on weight shifts and improved proprioception  Standing dynamic punching at speed bag while standing on airex pad, 2 minutes Standing dynamic punching at speed bag while in tandem stance on airex pad, 2 minutes with each foot leading   PATIENT EDUCATION: Education details: goal reassessment, indications, plan Person educated: Patient Education method: Explanation, Demonstration, and Verbal cues Education comprehension: verbalized understanding, returned demonstration, and needs further education   HOME EXERCISE PROGRAM: 02/04/22: seated RLE hip ABD with green TB, emphasis on eccentric control  Instructed patient in pool exercise: Access Code: [email or web address] Date: 10/22/2021 Prepared by: MBlanche East Exercises - Seated Knee Extension with Resistance  - 1 x daily - 7 x weekly - 2 sets - 15 reps - Forward and Backward Stepping at [employer] - 1 x daily - 7 x weekly - 1 sets - 5 reps - Freestyle Swimming with Snorkel Mask  - 1 x daily - 7 x weekly - 1 sets - 2-3 reps - Heel Walking  - 1 x  daily - 7 x weekly - 1 sets - 5 reps - Toe Walking  - 1 x daily - 7 x weekly - 1 sets - 5 reps - Single Leg Stance at Pool Wall  - 1 x daily - 7 x weekly - 1 sets - 3-5 reps - 15-30 sec hold   PT Short Term Goals -       PT SHORT TERM GOAL #1   Title Patient will be adherent to HEP at least 3x a week to improve functional strength and balance for better safety at home.    Baseline 9/21: Pt reports compliance with HEP and is confident, 1/3: doing exercise 2x a week; 06/25/21: 3x/week, 5/18: consistent; 8/1: consistent;    Time 4    Period Weeks    Status Achieved    Target Date 06/25/21      PT SHORT TERM GOAL #2   Title Patient will be independent in transferring sit<>Stand without pushing on arm rests to improve ability to get up from chair.    Baseline 9/21: pt able to complete STS without use of UEs, 1/3:able to stand but has  moderate difficulty requiring increased time; 06/25/21: able to perform indep without hands but requiring use of momentum to attain standing. Remains stable. 5/18: able to stand without UE assist, close supervision, remains stable;    Time 4    Period Weeks    Status Achieved    Target Date 07/23/21              PT Long Term Goals -      PT LONG TERM GOAL #1   Title Patient (> 73 years old) will complete five times sit to stand test in < 15 seconds indicating an increased LE strength and improved balance.    Baseline 9/21: 29 seconds hands-free 10/10: deferred 10/12: 34 sec hands free; 11/2: 23.8 sec hands-free; 12/7: 33.2 sec hands free, 1/3: 33.92 hands free; 06/25/21: 25.69 sec, 4/13: 15.5 sec hands free, 5/18: 23 sec without UE 6/22: deferred, 8/1: 15.7 sec without UE; 9/7: 15 sec without UE    Time 8    Period Weeks    Status Partially met   Target Date  05/11/2022     PT LONG TERM GOAL #2   Title Patient will increase six minute walk test distance to >400 feet for improve gait ability    Baseline 9/21: 368 ft with RW; 10/10: deferred 10/12: 408 ft; 11/2: 476 ft with 4WW, 1/3: 475 feet with RW; 4/13: 450 feet without AD CGA 10/15/21: 460 feet without AD, CGA   Time 8    Period Weeks    Status Achieved    Target Date 02/16/22     PT LONG TERM GOAL #3   Title Patient will increase 10 meter walk test to >1.12ms as to improve gait speed for better community ambulation and to reduce fall risk.    Baseline 9/21: 0.5 m/s with RW; 10/10 deferred 10/12: 0.93 m/s with 47FI 11/2: 0.83 m/s with 44PP 12/7: 0.52 m/s with RW, 1/3: 0.704; 06/25/21: 0.55 m/s with SPC. 5/18: 0.4 m/s without AD , 10/15/21 0.507 m/s without AD, 8/1: 0.64 m/s; 9/7: 0.42 m/s with SPC, 0.43 m/s without AD   Time 8    Period Weeks    Status On going    Target Date 05/11/2022     PT LONG TERM GOAL #4   Title Patient will be independent with ascend/descend 6 steps using single UE  in step over step pattern without LOB.     Baseline 9/21:  Patient uses PT stairs (4 steps) with bilateral upper extremity support on handrails for both ascending/descending.  Patient exhibits reciprocal pattern ascending and step to pattern descending. 10/10: deferred; 10/12: ascending/descending recip. steps with BUE support; 11/2: Ascended/descended 8 steps total, reciprocal pattern ascending and step-to descending. Pt uses UUE support throughout with CGA assist.; 12/7: Ascended and descended 8 steps total with UUE support and CGA. Reciprocal stepping ascending and step to descending., 1/3: Deferrred; 06/25/21: able to perform with SUE support and reciprocal pattern asc/desc, but does require close CGA with descending. 5/18: independent with 2 handrails, close supervision with 1 handrai; 10/15/21: requires 2 handrails, 8/1: requires 1 handrail, descends one step at a time; 9/7: uses 1 handrail, recip pattern ascending, able to do recip pattern descending but with one instance of LOB where pt self-corrects    Time 8    Period Weeks    Status Partially Met    Target Date 05/11/22     PT LONG TERM GOAL #5   Title Patient will demonstrate an improved Berg Balance Score of >45/56 as to demonstrate improved balance with ADLs such as sitting/standing and transfer balance and reduced fall risk.    Baseline 9/21: deferred d/t time; 9/29: 42/56; 10/10: deferred; 10/12: 44/56; 11/2: deferred; 11/7: 46/56, 1/3: deferred    Time 8    Period Weeks    Status Achieved    Target Date 12/17/21     PT LONG TERM GOAL #6   Title Patient will improve FOTO score to >60% to indicate improved functional mobility with ADLs.    Baseline 9/21: 59; 10/10: 57; 11/2: 60; 12/7: 58%, 1/3: 61%, 4/13: 56% 10/15/21: 58%, 8/1: 56%; 9/7: 67%   Time 8    Period Weeks    Status MET   Target Date 08/24/23w     PT LONG TERM GOAL #7   Title Pt will improve FGA by a minimum of 5 points to indicate clinically significant improvement in reduced risk of falls with walking tasks.     Baseline 06/25/21: 12, 4/13: deferred, 5/18: deferred due to increased ankle discomfort; 6/22: deferred due to elevated BP- will assess next visit; 10/20/21: 8/30, 8/1: 8/30; 9/7: 13/30   Time 8    Period Weeks    Status MET   Target Date 12/17/21   PT LONG TERM GOAL #8  Title Pt will improve FGA to >18/30 to indicate clinically significant improvement in reduced risk of falls with walking tasks and improved balance.  Baseline 9/7: 13/30  Time 7  Period Weeks   Status NEW  Target Date 05/11/21            Plan -    Clinical Impression Statement Pt continues to put forth good effort and is performing well with newly introduced exercises to challenge her balance.  Pt and therapist discussed potential discharge at next goal assessment with plateau in goal progression and pt agreeable at this time.  Will continue to benefit from skilled therapy to address continued deficits for improved functional mobility.    Personal Factors and Comorbidities Age    Comorbidities HTN, CKD, fall risk, type 2 diabetes,    Examination-Activity Limitations Lift;Locomotion Level;Squat;Stairs;Stand;Toileting;Transfers;Bathing    Examination-Participation Restrictions Cleaning;Community Activity;Laundry;Meal Prep;Shop;Volunteer;Driving    Stability/Clinical Decision Making Stable/Uncomplicated    Rehab Potential Fair    PT Frequency 2x / week    PT Duration 8 weeks    PT  Treatment/Interventions Cryotherapy;Electrical Stimulation;Moist Heat;Gait training;Stair training;Functional mobility training;Therapeutic activities;Therapeutic exercise;Neuromuscular re-education;Balance training;Patient/family education;Orthotic Fit/Training;Energy conservation    PT Next Visit Plan Balance reactive strategies, interval walking training, stair training, glute strengthening   PT Home Exercise Plan No changes this date    Consulted and Agree with Plan of Care Patient             Gwenlyn Saran, PT, DPT Physical  Therapist- [facility]  03/16/22, 2:14 PM

## 2022-03-23 ENCOUNTER — Ambulatory Visit: Payer: Medicare HMO | Admitting: Occupational Therapy

## 2022-03-23 ENCOUNTER — Ambulatory Visit: Payer: Medicare HMO

## 2022-03-23 DIAGNOSIS — R262 Difficulty in walking, not elsewhere classified: Secondary | ICD-10-CM | POA: Diagnosis not present

## 2022-03-23 DIAGNOSIS — R2681 Unsteadiness on feet: Secondary | ICD-10-CM

## 2022-03-23 DIAGNOSIS — R269 Unspecified abnormalities of gait and mobility: Secondary | ICD-10-CM

## 2022-03-23 DIAGNOSIS — M6281 Muscle weakness (generalized): Secondary | ICD-10-CM

## 2022-03-23 DIAGNOSIS — R482 Apraxia: Secondary | ICD-10-CM

## 2022-03-23 DIAGNOSIS — R278 Other lack of coordination: Secondary | ICD-10-CM

## 2022-03-23 NOTE — Therapy (Signed)
OUTPATIENT PHYSICAL THERAPY TREATMENT NOTE    Patient Name: Cassandra Schaefer MRN: [id number] DOB:1928/12/16, 88 y.o., female Today's Date: 03/23/2022  PCP: Roetta Sessions MD REFERRING PROVIDER: Roetta Sessions MD   PT End of Session - 03/23/22 1451     Visit Number 119    Number of Visits 125    Date for PT Re-Evaluation 05/11/22    Authorization Type [employer]    Authorization Time Period 02/16/22-05/11/22    Progress Note Due on Visit 120    PT Start Time 0258    PT Stop Time 1530    PT Time Calculation (min) 41 min    Equipment Utilized During Treatment Gait belt    Activity Tolerance Patient tolerated treatment well;No increased pain    Behavior During Therapy WFL for tasks assessed/performed                Past Medical History:  Diagnosis Date   Anemia    Cough    RESOLVING, NO FEVER   Diabetes mellitus without complication ([place])    Gout    Hypertension    Hypothyroidism    PUD (peptic ulcer disease)    Stroke [facility])    Wears dentures    full upper, partial lower   Past Surgical History:  Procedure Laterality Date   AMPUTATION TOE Left 12/14/2017   Procedure: AMPUTATION TOE-4TH MPJ;  Surgeon: Samara Deist, DPM;  Location: [place];  Service: Podiatry;  Laterality: Left;  IVA LOCAL Diabetic - oral meds   APPENDECTOMY     CATARACT EXTRACTION W/PHACO Right 06/23/2015   Procedure: CATARACT EXTRACTION PHACO AND INTRAOCULAR LENS PLACEMENT (IOC);  Surgeon: Estill Cotta, MD;  Location: [facility];  Service: Ophthalmology;  Laterality: Right;  [place] 01:28 AP% 23.4 CDE 36.14 fluid pack lot # [id number]   CATARACT EXTRACTION W/PHACO Left 07/28/2015   Procedure: CATARACT EXTRACTION PHACO AND INTRAOCULAR LENS PLACEMENT (IOC);  Surgeon: Estill Cotta, MD;  Location: [facility];  Service: Ophthalmology;  Laterality: Left;  [place]    1:29.7 AP%  24.9 CDE   41.32 fluid casette lot #[id number] H exp 09/23/2016   COONOSCOPY     AND ENDOSCOPY   DILATION AND CURETTAGE OF  UTERUS     ENDARTERECTOMY Left 11/13/2020   Procedure: Evacuation of left neck hematoma;  Surgeon: Katha Cabal, MD;  Location: [facility];  Service: Vascular;  Laterality: Left;   ENDARTERECTOMY Left 11/13/2020   Procedure: ENDARTERECTOMY CAROTID;  Surgeon: Algernon Huxley, MD;  Location: [facility];  Service: Vascular;  Laterality: Left;   TONSILLECTOMY     TUBAL LIGATION     Patient Active Problem List   Diagnosis Date Noted   Carotid stenosis, symptomatic, with infarction ([place]) 11/13/2020   Gout flare 11/10/2020   UTI (urinary tract infection) 11/10/2020   Left carotid artery stenosis 11/10/2020   Chronic kidney disease 11/10/2020   Left basal ganglia embolic stroke ([place]) 44/31/5400   PAC (premature atrial contraction)    Pre-procedural cardiovascular examination    Ischemic stroke ([place]) 10/20/2020   TIA (transient ischemic attack) 10/19/2020   Right hemiparesis ([place]) 10/19/2020   Type 2 diabetes mellitus with stage 3 chronic kidney disease, without long-term current use of insulin ([place]) 08/24/2018   Hyperlipidemia, unspecified 07/19/2017   Hypertension 07/19/2017   PUD (peptic ulcer disease) 07/19/2017   Type 2 diabetes mellitus ([place]) 07/19/2017   Personal history of gout 08/12/2016   Anemia, unspecified 04/09/2016   Hypothyroidism, acquired 12/10/2015    REFERRING DIAG: CVA  with right hemiparesis  THERAPY DIAG:  Difficulty in walking, not elsewhere classified  Muscle weakness (generalized)  Abnormality of gait and mobility  Unsteadiness on feet  Other lack of coordination  Apraxia  Rationale for Evaluation and Treatment Rehabilitation  PERTINENT HISTORY: 88 y.o. female with medical history significant for type 2 diabetes mellitus, essential hypertension, acquired hypothyroidism, hyperlipidemia, stage III chronic kidney disease reports increased right sided weakness on 10/19/20. She was diagnosed with left basal ganglia ischemic stroke. She was discharged to inpatient  rehab for 10 days and then discharged home. Patient was evaluated by outpatient PT on 11/12/20. CVA was due to carotid stenosis. She underwent left carotid endartectomy on 7/21. Procedure went well however patient suffered from left cervical hematoma and had subsequent surgical procedure to stop the bleed and remove the hematoma on 11/13/20. They were concerned with her breathing and therefore intubated her for 1 day, she was in ICU for 2 days and transferred to med/surge on 11/15/20. Patient was discharged home on 11/17/20 and is now returning to outpatient PT. She has had the stiches removed and is healing well. Denies any neck pain or stiffness. She lives with her daughter and has 24/7 caregiver support. She is still using RW for most ambulation. She is able to transfer to bedside commode well and is able to stand short time unsupported. She reports feeling very weak and fatigued. She reports she has been dragging her right foot more. She denies any numbness/tingling; She reports feeling like her legs are wanting to do more but is hesitant because of fear of falling. She is mod I for self care morning routine. She does still receive help with showering. She has been working on increasing her activity but denies any formal exercise.   PRECAUTIONS: Fall risk  SUBJECTIVE:   Pt reports her sugar is getting better and she had a good Thanksgiving.  She notes she ate in small portions to keep her sugar in check.     PAIN:  Are you having pain? No   TODAY'S TREATMENT: 03/23/2022  Gait belt donned, CGA-supervision  for safety unless otherwise noted  Neuro Re-Ed:  Backwards ambulation in hallway necessary for improved balance and activation of posterior chain musculature, 33f x4  KoreBalance TuxRacer for ~12 minutes total during practice session for improving weight shifts and foot/eye coordination KoreBalance NeverBall, 5 levels total with continued focus on weight shifts and improved  proprioception  Ambulation around the gym without AD, x4 laps total before pt started to feel fatigued   PATIENT EDUCATION: Education details: goal reassessment, indications, plan Person educated: Patient Education method: Explanation, Demonstration, and Verbal cues Education comprehension: verbalized understanding, returned demonstration, and needs further education   HOME EXERCISE PROGRAM: 02/04/22: seated RLE hip ABD with green TB, emphasis on eccentric control  Instructed patient in pool exercise: Access Code: [email or web address] Date: 10/22/2021 Prepared by: MBlanche East Exercises - Seated Knee Extension with Resistance  - 1 x daily - 7 x weekly - 2 sets - 15 reps - Forward and Backward Stepping at [employer] - 1 x daily - 7 x weekly - 1 sets - 5 reps - Freestyle Swimming with Snorkel Mask  - 1 x daily - 7 x weekly - 1 sets - 2-3 reps - Heel Walking  - 1 x daily - 7 x weekly - 1 sets - 5 reps - Toe Walking  - 1 x daily - 7 x weekly - 1 sets - 5 reps -  Single Leg Stance at Pool Wall  - 1 x daily - 7 x weekly - 1 sets - 3-5 reps - 15-30 sec hold   PT Short Term Goals -       PT SHORT TERM GOAL #1   Title Patient will be adherent to HEP at least 3x a week to improve functional strength and balance for better safety at home.    Baseline 9/21: Pt reports compliance with HEP and is confident, 1/3: doing exercise 2x a week; 06/25/21: 3x/week, 5/18: consistent; 8/1: consistent;    Time 4    Period Weeks    Status Achieved    Target Date 06/25/21      PT SHORT TERM GOAL #2   Title Patient will be independent in transferring sit<>Stand without pushing on arm rests to improve ability to get up from chair.    Baseline 9/21: pt able to complete STS without use of UEs, 1/3:able to stand but has moderate difficulty requiring increased time; 06/25/21: able to perform indep without hands but requiring use of momentum to attain standing. Remains stable.  5/18: able to stand without UE assist, close supervision, remains stable;    Time 4    Period Weeks    Status Achieved    Target Date 07/23/21              PT Long Term Goals -      PT LONG TERM GOAL #1   Title Patient (> 78 years old) will complete five times sit to stand test in < 15 seconds indicating an increased LE strength and improved balance.    Baseline 9/21: 29 seconds hands-free 10/10: deferred 10/12: 34 sec hands free; 11/2: 23.8 sec hands-free; 12/7: 33.2 sec hands free, 1/3: 33.92 hands free; 06/25/21: 25.69 sec, 4/13: 15.5 sec hands free, 5/18: 23 sec without UE 6/22: deferred, 8/1: 15.7 sec without UE; 9/7: 15 sec without UE    Time 8    Period Weeks    Status Partially met   Target Date  05/11/2022     PT LONG TERM GOAL #2   Title Patient will increase six minute walk test distance to >400 feet for improve gait ability    Baseline 9/21: 368 ft with RW; 10/10: deferred 10/12: 408 ft; 11/2: 476 ft with 4WW, 1/3: 475 feet with RW; 4/13: 450 feet without AD CGA 10/15/21: 460 feet without AD, CGA   Time 8    Period Weeks    Status Achieved    Target Date 02/16/22     PT LONG TERM GOAL #3   Title Patient will increase 10 meter walk test to >1.45ms as to improve gait speed for better community ambulation and to reduce fall risk.    Baseline 9/21: 0.5 m/s with RW; 10/10 deferred 10/12: 0.93 m/s with 48QP 11/2: 0.83 m/s with 46PP 12/7: 0.52 m/s with RW, 1/3: 0.704; 06/25/21: 0.55 m/s with SPC. 5/18: 0.4 m/s without AD , 10/15/21 0.507 m/s without AD, 8/1: 0.64 m/s; 9/7: 0.42 m/s with SPC, 0.43 m/s without AD   Time 8    Period Weeks    Status On going    Target Date 05/11/2022     PT LONG TERM GOAL #4   Title Patient will be independent with ascend/descend 6 steps using single UE in step over step pattern without LOB.    Baseline 9/21:  Patient uses PT stairs (4 steps) with bilateral upper extremity support on handrails for both ascending/descending.  Patient exhibits  reciprocal pattern ascending and step to pattern descending. 10/10: deferred; 10/12: ascending/descending recip. steps with BUE support; 11/2: Ascended/descended 8 steps total, reciprocal pattern ascending and step-to descending. Pt uses UUE support throughout with CGA assist.; 12/7: Ascended and descended 8 steps total with UUE support and CGA. Reciprocal stepping ascending and step to descending., 1/3: Deferrred; 06/25/21: able to perform with SUE support and reciprocal pattern asc/desc, but does require close CGA with descending. 5/18: independent with 2 handrails, close supervision with 1 handrai; 10/15/21: requires 2 handrails, 8/1: requires 1 handrail, descends one step at a time; 9/7: uses 1 handrail, recip pattern ascending, able to do recip pattern descending but with one instance of LOB where pt self-corrects    Time 8    Period Weeks    Status Partially Met    Target Date 05/11/22     PT LONG TERM GOAL #5   Title Patient will demonstrate an improved Berg Balance Score of >45/56 as to demonstrate improved balance with ADLs such as sitting/standing and transfer balance and reduced fall risk.    Baseline 9/21: deferred d/t time; 9/29: 42/56; 10/10: deferred; 10/12: 44/56; 11/2: deferred; 11/7: 46/56, 1/3: deferred    Time 8    Period Weeks    Status Achieved    Target Date 12/17/21     PT LONG TERM GOAL #6   Title Patient will improve FOTO score to >60% to indicate improved functional mobility with ADLs.    Baseline 9/21: 59; 10/10: 57; 11/2: 60; 12/7: 58%, 1/3: 61%, 4/13: 56% 10/15/21: 58%, 8/1: 56%; 9/7: 67%   Time 8    Period Weeks    Status MET   Target Date 08/24/23w     PT LONG TERM GOAL #7   Title Pt will improve FGA by a minimum of 5 points to indicate clinically significant improvement in reduced risk of falls with walking tasks.    Baseline 06/25/21: 12, 4/13: deferred, 5/18: deferred due to increased ankle discomfort; 6/22: deferred due to elevated BP- will assess next visit;  10/20/21: 8/30, 8/1: 8/30; 9/7: 13/30   Time 8    Period Weeks    Status MET   Target Date 12/17/21   PT LONG TERM GOAL #8  Title Pt will improve FGA to >18/30 to indicate clinically significant improvement in reduced risk of falls with walking tasks and improved balance.  Baseline 9/7: 13/30  Time 7  Period Weeks   Status NEW  Target Date 05/11/21            Plan -    Clinical Impression Statement Pt continues to perform well with exercises and is making good progress towards goals.  Pt continues to put forth good effort throughout the sessions and will be assessed at next visit as part of progress note.   Pt will continue to benefit from skilled therapy to address remaining deficits in order to improve overall QoL and return to PLOF.      Personal Factors and Comorbidities Age    Comorbidities HTN, CKD, fall risk, type 2 diabetes,    Examination-Activity Limitations Lift;Locomotion Level;Squat;Stairs;Stand;Toileting;Transfers;Bathing    Examination-Participation Restrictions Cleaning;Community Activity;Laundry;Meal Prep;Shop;Volunteer;Driving    Stability/Clinical Decision Making Stable/Uncomplicated    Rehab Potential Fair    PT Frequency 2x / week    PT Duration 8 weeks    PT Treatment/Interventions Cryotherapy;Electrical Stimulation;Moist Heat;Gait training;Stair training;Functional mobility training;Therapeutic activities;Therapeutic exercise;Neuromuscular re-education;Balance training;Patient/family education;Orthotic Fit/Training;Energy conservation    PT Next Visit Plan Balance reactive  strategies, interval walking training, stair training, glute strengthening   PT Home Exercise Plan No changes this date    Consulted and Agree with Plan of Care Patient             Gwenlyn Saran, PT, DPT Physical Therapist- [facility]  03/23/22, 5:48 PM

## 2022-03-24 NOTE — Therapy (Signed)
OUTPATIENT PHYSICAL THERAPY TREATMENT NOTE/PHYSICAL THERAPY PROGRESS NOTE   Dates of reporting period  02/16/22   to   03/25/22     Patient Name: Cassandra Schaefer MRN: [id number] DOB:08-24-1928, 88 y.o., female Today's Date: 03/25/2022  PCP: Roetta Sessions MD REFERRING PROVIDER: Roetta Sessions MD   PT End of Session - 03/25/22 1350     Visit Number 120    Number of Visits 125    Date for PT Re-Evaluation 05/11/22    Authorization Type [employer]    Authorization Time Period 02/16/22-05/11/22    Progress Note Due on Visit 120    PT Start Time 1350    PT Stop Time 1430    PT Time Calculation (min) 40 min    Equipment Utilized During Treatment Gait belt    Activity Tolerance Patient tolerated treatment well;No increased pain    Behavior During Therapy WFL for tasks assessed/performed                 Past Medical History:  Diagnosis Date   Anemia    Cough    RESOLVING, NO FEVER   Diabetes mellitus without complication ([place])    Gout    Hypertension    Hypothyroidism    PUD (peptic ulcer disease)    Stroke [facility])    Wears dentures    full upper, partial lower   Past Surgical History:  Procedure Laterality Date   AMPUTATION TOE Left 12/14/2017   Procedure: AMPUTATION TOE-4TH MPJ;  Surgeon: Samara Deist, DPM;  Location: [place];  Service: Podiatry;  Laterality: Left;  IVA LOCAL Diabetic - oral meds   APPENDECTOMY     CATARACT EXTRACTION W/PHACO Right 06/23/2015   Procedure: CATARACT EXTRACTION PHACO AND INTRAOCULAR LENS PLACEMENT (IOC);  Surgeon: Estill Cotta, MD;  Location: [facility];  Service: Ophthalmology;  Laterality: Right;  [place] 01:28 AP% 23.4 CDE 36.14 fluid pack lot # [id number]   CATARACT EXTRACTION W/PHACO Left 07/28/2015   Procedure: CATARACT EXTRACTION PHACO AND INTRAOCULAR LENS PLACEMENT (IOC);  Surgeon: Estill Cotta, MD;  Location: [facility];  Service: Ophthalmology;  Laterality: Left;  [place]    1:29.7 AP%  24.9 CDE   41.32 fluid casette  lot #[id number] H exp 09/23/2016   COONOSCOPY     AND ENDOSCOPY   DILATION AND CURETTAGE OF UTERUS     ENDARTERECTOMY Left 11/13/2020   Procedure: Evacuation of left neck hematoma;  Surgeon: Katha Cabal, MD;  Location: [facility];  Service: Vascular;  Laterality: Left;   ENDARTERECTOMY Left 11/13/2020   Procedure: ENDARTERECTOMY CAROTID;  Surgeon: Algernon Huxley, MD;  Location: [facility];  Service: Vascular;  Laterality: Left;   TONSILLECTOMY     TUBAL LIGATION     Patient Active Problem List   Diagnosis Date Noted   Carotid stenosis, symptomatic, with infarction ([place]) 11/13/2020   Gout flare 11/10/2020   UTI (urinary tract infection) 11/10/2020   Left carotid artery stenosis 11/10/2020   Chronic kidney disease 11/10/2020   Left basal ganglia embolic stroke ([place]) 49/44/9675   PAC (premature atrial contraction)    Pre-procedural cardiovascular examination    Ischemic stroke ([place]) 10/20/2020   TIA (transient ischemic attack) 10/19/2020   Right hemiparesis ([place]) 10/19/2020   Type 2 diabetes mellitus with stage 3 chronic kidney disease, without long-term current use of insulin ([place]) 08/24/2018   Hyperlipidemia, unspecified 07/19/2017   Hypertension 07/19/2017   PUD (peptic ulcer disease) 07/19/2017   Type 2 diabetes mellitus ([place]) 07/19/2017   Personal history  of gout 08/12/2016   Anemia, unspecified 04/09/2016   Hypothyroidism, acquired 12/10/2015    REFERRING DIAG: CVA with right hemiparesis  THERAPY DIAG:  No diagnosis found.  Rationale for Evaluation and Treatment Rehabilitation  PERTINENT HISTORY: 88 y.o. female with medical history significant for type 2 diabetes mellitus, essential hypertension, acquired hypothyroidism, hyperlipidemia, stage III chronic kidney disease reports increased right sided weakness on 10/19/20. She was diagnosed with left basal ganglia ischemic stroke. She was discharged to inpatient rehab for 10 days and then discharged home. Patient was evaluated by  outpatient PT on 11/12/20. CVA was due to carotid stenosis. She underwent left carotid endartectomy on 7/21. Procedure went well however patient suffered from left cervical hematoma and had subsequent surgical procedure to stop the bleed and remove the hematoma on 11/13/20. They were concerned with her breathing and therefore intubated her for 1 day, she was in ICU for 2 days and transferred to med/surge on 11/15/20. Patient was discharged home on 11/17/20 and is now returning to outpatient PT. She has had the stiches removed and is healing well. Denies any neck pain or stiffness. She lives with her daughter and has 24/7 caregiver support. She is still using RW for most ambulation. She is able to transfer to bedside commode well and is able to stand short time unsupported. She reports feeling very weak and fatigued. She reports she has been dragging her right foot more. She denies any numbness/tingling; She reports feeling like her legs are wanting to do more but is hesitant because of fear of falling. She is mod I for self care morning routine. She does still receive help with showering. She has been working on increasing her activity but denies any formal exercise.   PRECAUTIONS: Fall risk  SUBJECTIVE:   Pt reports nothing new upon arrival and without complaints.  Pt reports she is still getting her sugar regulated at this time.    PAIN:  Are you having pain? No   TODAY'S TREATMENT: 03/25/2022  Goal assessment performed as noted below:    PATIENT EDUCATION: Education details: goal reassessment, indications, plan Person educated: Patient Education method: Explanation, Demonstration, and Verbal cues Education comprehension: verbalized understanding, returned demonstration, and needs further education   HOME EXERCISE PROGRAM: 02/04/22: seated RLE hip ABD with green TB, emphasis on eccentric control  Instructed patient in pool exercise: Access Code: [place]:  [email or web address] Date: 10/22/2021 Prepared by: Blanche East  Exercises - Seated Knee Extension with Resistance  - 1 x daily - 7 x weekly - 2 sets - 15 reps - Forward and Backward Stepping at [employer]  - 1 x daily - 7 x weekly - 1 sets - 5 reps - Freestyle Swimming with Snorkel Mask  - 1 x daily - 7 x weekly - 1 sets - 2-3 reps - Heel Walking  - 1 x daily - 7 x weekly - 1 sets - 5 reps - Toe Walking  - 1 x daily - 7 x weekly - 1 sets - 5 reps - Single Leg Stance at Pool Wall  - 1 x daily - 7 x weekly - 1 sets - 3-5 reps - 15-30 sec hold   PT Short Term Goals -       PT SHORT TERM GOAL #1   Title Patient will be adherent to HEP at least 3x a week to improve functional strength and balance for better safety at home.    Baseline 9/21: Pt reports compliance with HEP and is confident,  1/3: doing exercise 2x a week; 06/25/21: 3x/week, 5/18: consistent; 8/1: consistent;    Time 4    Period Weeks    Status MET    Target Date 06/25/21      PT SHORT TERM GOAL #2   Title Patient will be independent in transferring sit<>Stand without pushing on arm rests to improve ability to get up from chair.    Baseline 9/21: pt able to complete STS without use of UEs, 1/3:able to stand but has moderate difficulty requiring increased time; 06/25/21: able to perform indep without hands but requiring use of momentum to attain standing. Remains stable. 5/18: able to stand without UE assist, close supervision, remains stable;    Time 4    Period Weeks    Status MET    Target Date 07/23/21              PT Long Term Goals -      PT LONG TERM GOAL #1   Title Patient (> 9 years old) will complete five times sit to stand test in < 15 seconds indicating an increased LE strength and improved balance.    Baseline 9/21: 29 seconds hands-free 10/10: deferred 10/12: 34 sec hands free; 11/2: 23.8 sec hands-free; 12/7: 33.2 sec hands free, 1/3: 33.92 hands free; 06/25/21: 25.69 sec, 4/13: 15.5 sec  hands free, 5/18: 23 sec without UE 6/22: deferred, 8/1: 15.7 sec without UE; 9/7: 15 sec without UE    Time 8    Period Weeks    Status PARTIALLY MET   Target Date  05/11/2022     PT LONG TERM GOAL #2   Title Patient will increase six minute walk test distance to >400 feet for improve gait ability    Baseline 9/21: 368 ft with RW; 10/10: deferred 10/12: 408 ft; 11/2: 476 ft with 4WW, 1/3: 475 feet with RW; 4/13: 450 feet without AD CGA 10/15/21: 460 feet without AD, CGA   Time 8    Period Weeks    Status ACHIEVED    Target Date 02/16/22     PT LONG TERM GOAL #3   Title Patient will increase 10 meter walk test to >1.16ms as to improve gait speed for better community ambulation and to reduce fall risk.    Baseline 9/21: 0.5 m/s with RW; 10/10 deferred 10/12: 0.93 m/s with 40YT 11/2: 0.83 m/s with 41ZN 12/7: 0.52 m/s with RW, 1/3: 0.704; 06/25/21: 0.55 m/s with SPC. 5/18: 0.4 m/s without AD , 10/15/21 0.507 m/s without AD, 8/1: 0.64 m/s; 9/7: 0.42 m/s with SPC, 0.43 m/s without AD; 11/30: 0.42 with SPC, 0.43 m/s without AD   Time 8    Period Weeks    Status ONGOING   Target Date 05/11/2022     PT LONG TERM GOAL #4   Title Patient will be independent with ascend/descend 6 steps using single UE in step over step pattern without LOB.    Baseline 9/21:  Patient uses PT stairs (4 steps) with bilateral upper extremity support on handrails for both ascending/descending.  Patient exhibits reciprocal pattern ascending and step to pattern descending. 10/10: deferred; 10/12: ascending/descending recip. steps with BUE support; 11/2: Ascended/descended 8 steps total, reciprocal pattern ascending and step-to descending. Pt uses UUE support throughout with CGA assist.; 12/7: Ascended and descended 8 steps total with UUE support and CGA. Reciprocal stepping ascending and step to descending., 1/3: Deferrred; 06/25/21: able to perform with SUE support and reciprocal pattern asc/desc, but does require close CGA  with  descending. 5/18: independent with 2 handrails, close supervision with 1 handrai; 10/15/21: requires 2 handrails, 8/1: requires 1 handrail, descends one step at a time; 9/7: uses 1 handrail, recip pattern ascending, able to do recip pattern descending but with one instance of LOB where pt self-corrects; 11/30:   Time 8    Period Weeks    Status PARTIALLY MET   Target Date 05/11/22     PT LONG TERM GOAL #5   Title Patient will demonstrate an improved Berg Balance Score of >45/56 as to demonstrate improved balance with ADLs such as sitting/standing and transfer balance and reduced fall risk.    Baseline 9/21: deferred d/t time; 9/29: 42/56; 10/10: deferred; 10/12: 44/56; 11/2: deferred; 11/7: 46/56, 1/3: deferred    Time 8    Period Weeks    Status MET    Target Date 12/17/21     PT LONG TERM GOAL #6   Title Patient will improve FOTO score to >60% to indicate improved functional mobility with ADLs.    Baseline 9/21: 59; 10/10: 57; 11/2: 60; 12/7: 58%, 1/3: 61%, 4/13: 56% 10/15/21: 58%, 8/1: 56%; 9/7: 67%   Time 8    Period Weeks    Status MET   Target Date 12/17/21     PT LONG TERM GOAL #7   Title Pt will improve FGA by a minimum of 5 points to indicate clinically significant improvement in reduced risk of falls with walking tasks.    Baseline 06/25/21: 12, 4/13: deferred, 5/18: deferred due to increased ankle discomfort; 6/22: deferred due to elevated BP- will assess next visit; 10/20/21: 8/30, 8/1: 8/30; 9/7: 13/30   Time 8    Period Weeks    Status MET   Target Date 12/17/21   PT LONG TERM GOAL #8  Title Pt will improve FGA to >18/30 to indicate clinically significant improvement in reduced risk of falls with walking tasks and improved balance.  Baseline 9/7: 13/30; 11/30: 14/30  Time 7  Period Weeks   Status PROGRESSING  Target Date 05/11/21            Plan -    Clinical Impression Statement Pt making minimal improvements towards goals at this time.  Pt is satisfied with  progress and would like to continue with therapy at least until the new year to continue towards improving overall QoL and functional mobility.  Pt and therapist agreed to this current plan and will continue as scheduled POC.  Patient's condition has the potential to improve in response to therapy. Maximum improvement is yet to be obtained. The anticipated improvement is attainable and reasonable in a generally predictable time.   Pt will continue to benefit from skilled therapy to address remaining deficits in order to improve overall QoL and return to PLOF.         Personal Factors and Comorbidities Age    Comorbidities HTN, CKD, fall risk, type 2 diabetes,    Examination-Activity Limitations Lift;Locomotion Level;Squat;Stairs;Stand;Toileting;Transfers;Bathing    Examination-Participation Restrictions Cleaning;Community Activity;Laundry;Meal Prep;Shop;Volunteer;Driving    Stability/Clinical Decision Making Stable/Uncomplicated    Rehab Potential Fair    PT Frequency 2x / week    PT Duration 8 weeks    PT Treatment/Interventions Cryotherapy;Electrical Stimulation;Moist Heat;Gait training;Stair training;Functional mobility training;Therapeutic activities;Therapeutic exercise;Neuromuscular re-education;Balance training;Patient/family education;Orthotic Fit/Training;Energy conservation    PT Next Visit Plan Balance reactive strategies, interval walking training, stair training, glute strengthening   PT Home Exercise Plan No changes this date    Consulted and Agree with  Plan of Care Patient             Gwenlyn Saran, PT, DPT Physical Therapist- [facility]  03/25/22, 1:52 PM

## 2022-03-25 ENCOUNTER — Ambulatory Visit: Payer: Medicare HMO

## 2022-03-25 ENCOUNTER — Ambulatory Visit: Payer: Medicare HMO | Admitting: Occupational Therapy

## 2022-03-25 DIAGNOSIS — R278 Other lack of coordination: Secondary | ICD-10-CM

## 2022-03-25 DIAGNOSIS — R269 Unspecified abnormalities of gait and mobility: Secondary | ICD-10-CM

## 2022-03-25 DIAGNOSIS — R262 Difficulty in walking, not elsewhere classified: Secondary | ICD-10-CM | POA: Diagnosis not present

## 2022-03-25 DIAGNOSIS — M6281 Muscle weakness (generalized): Secondary | ICD-10-CM

## 2022-03-25 DIAGNOSIS — R2681 Unsteadiness on feet: Secondary | ICD-10-CM

## 2022-03-25 DIAGNOSIS — R482 Apraxia: Secondary | ICD-10-CM

## 2022-03-30 ENCOUNTER — Ambulatory Visit: Payer: Medicare HMO | Attending: Vascular Surgery

## 2022-03-30 ENCOUNTER — Encounter: Payer: Medicare HMO | Admitting: Occupational Therapy

## 2022-03-30 DIAGNOSIS — R278 Other lack of coordination: Secondary | ICD-10-CM | POA: Insufficient documentation

## 2022-03-30 DIAGNOSIS — I639 Cerebral infarction, unspecified: Secondary | ICD-10-CM | POA: Diagnosis present

## 2022-03-30 DIAGNOSIS — R262 Difficulty in walking, not elsewhere classified: Secondary | ICD-10-CM | POA: Diagnosis present

## 2022-03-30 DIAGNOSIS — R269 Unspecified abnormalities of gait and mobility: Secondary | ICD-10-CM | POA: Diagnosis present

## 2022-03-30 DIAGNOSIS — R2681 Unsteadiness on feet: Secondary | ICD-10-CM | POA: Diagnosis present

## 2022-03-30 DIAGNOSIS — M6281 Muscle weakness (generalized): Secondary | ICD-10-CM | POA: Insufficient documentation

## 2022-03-30 NOTE — Therapy (Signed)
OUTPATIENT PHYSICAL THERAPY TREATMENT NOTE      Patient Name: Cassandra Schaefer MRN: [id number] DOB:1928/05/13, 88 y.o., female Today's Date: 03/30/2022  PCP: Roetta Sessions MD REFERRING PROVIDER: Roetta Sessions MD   PT End of Session - 03/30/22 1621     Visit Number 121    Number of Visits 125    Date for PT Re-Evaluation 05/11/22    Authorization Type [employer]    Authorization Time Period 02/16/22-05/11/22    Progress Note Due on Visit 120    PT Start Time 1346    PT Stop Time 1429    PT Time Calculation (min) 43 min    Equipment Utilized During Treatment Gait belt    Activity Tolerance Patient tolerated treatment well;No increased pain    Behavior During Therapy WFL for tasks assessed/performed                  Past Medical History:  Diagnosis Date   Anemia    Cough    RESOLVING, NO FEVER   Diabetes mellitus without complication ([place])    Gout    Hypertension    Hypothyroidism    PUD (peptic ulcer disease)    Stroke [facility])    Wears dentures    full upper, partial lower   Past Surgical History:  Procedure Laterality Date   AMPUTATION TOE Left 12/14/2017   Procedure: AMPUTATION TOE-4TH MPJ;  Surgeon: Samara Deist, DPM;  Location: [place];  Service: Podiatry;  Laterality: Left;  IVA LOCAL Diabetic - oral meds   APPENDECTOMY     CATARACT EXTRACTION W/PHACO Right 06/23/2015   Procedure: CATARACT EXTRACTION PHACO AND INTRAOCULAR LENS PLACEMENT (IOC);  Surgeon: Estill Cotta, MD;  Location: [facility];  Service: Ophthalmology;  Laterality: Right;  [place] 01:28 AP% 23.4 CDE 36.14 fluid pack lot # [id number]   CATARACT EXTRACTION W/PHACO Left 07/28/2015   Procedure: CATARACT EXTRACTION PHACO AND INTRAOCULAR LENS PLACEMENT (IOC);  Surgeon: Estill Cotta, MD;  Location: [facility];  Service: Ophthalmology;  Laterality: Left;  [place]    1:29.7 AP%  24.9 CDE   41.32 fluid casette lot #[id number] H exp 09/23/2016   COONOSCOPY     AND ENDOSCOPY   DILATION AND  CURETTAGE OF UTERUS     ENDARTERECTOMY Left 11/13/2020   Procedure: Evacuation of left neck hematoma;  Surgeon: Katha Cabal, MD;  Location: [facility];  Service: Vascular;  Laterality: Left;   ENDARTERECTOMY Left 11/13/2020   Procedure: ENDARTERECTOMY CAROTID;  Surgeon: Algernon Huxley, MD;  Location: [facility];  Service: Vascular;  Laterality: Left;   TONSILLECTOMY     TUBAL LIGATION     Patient Active Problem List   Diagnosis Date Noted   Carotid stenosis, symptomatic, with infarction ([place]) 11/13/2020   Gout flare 11/10/2020   UTI (urinary tract infection) 11/10/2020   Left carotid artery stenosis 11/10/2020   Chronic kidney disease 11/10/2020   Left basal ganglia embolic stroke ([place]) 85/88/5027   PAC (premature atrial contraction)    Pre-procedural cardiovascular examination    Ischemic stroke ([place]) 10/20/2020   TIA (transient ischemic attack) 10/19/2020   Right hemiparesis ([place]) 10/19/2020   Type 2 diabetes mellitus with stage 3 chronic kidney disease, without long-term current use of insulin ([place]) 08/24/2018   Hyperlipidemia, unspecified 07/19/2017   Hypertension 07/19/2017   PUD (peptic ulcer disease) 07/19/2017   Type 2 diabetes mellitus ([place]) 07/19/2017   Personal history of gout 08/12/2016   Anemia, unspecified 04/09/2016   Hypothyroidism, acquired 12/10/2015  REFERRING DIAG: CVA with right hemiparesis  THERAPY DIAG:  Unsteadiness on feet  Difficulty in walking, not elsewhere classified  Muscle weakness (generalized)  Rationale for Evaluation and Treatment Rehabilitation  PERTINENT HISTORY: 88 y.o. female with medical history significant for type 2 diabetes mellitus, essential hypertension, acquired hypothyroidism, hyperlipidemia, stage III chronic kidney disease reports increased right sided weakness on 10/19/20. She was diagnosed with left basal ganglia ischemic stroke. She was discharged to inpatient rehab for 10 days and then discharged home. Patient was  evaluated by outpatient PT on 11/12/20. CVA was due to carotid stenosis. She underwent left carotid endartectomy on 7/21. Procedure went well however patient suffered from left cervical hematoma and had subsequent surgical procedure to stop the bleed and remove the hematoma on 11/13/20. They were concerned with her breathing and therefore intubated her for 1 day, she was in ICU for 2 days and transferred to med/surge on 11/15/20. Patient was discharged home on 11/17/20 and is now returning to outpatient PT. She has had the stiches removed and is healing well. Denies any neck pain or stiffness. She lives with her daughter and has 24/7 caregiver support. She is still using RW for most ambulation. She is able to transfer to bedside commode well and is able to stand short time unsupported. She reports feeling very weak and fatigued. She reports she has been dragging her right foot more. She denies any numbness/tingling; She reports feeling like her legs are wanting to do more but is hesitant because of fear of falling. She is mod I for self care morning routine. She does still receive help with showering. She has been working on increasing her activity but denies any formal exercise.   PRECAUTIONS: Fall risk  SUBJECTIVE:   Pt reports improvement in her blood sugar since last session. She has follow-up appointment with her doctor to monitor this. She reports no stumbles/falls. She reports no pain currently and no other updates/concerns.    PAIN:  Are you having pain? No   TODAY'S TREATMENT: 03/30/2022  Gait belt donned, CGA-supervision  for safety unless otherwise noted   Neuro Re-Ed:  Gait without AD for dynamic balance, close CGA-min a throughout 4x18 ft, 1x148 ft    Backwards ambulation near support surface 8x18 ft  performed for improved balance and activation of posterior chain musculature,   Semi-tandem gait 8x18 ft (near support surface) - up to min assist and UE support provided  Gait with  horizontal and vertical head turns x multiple reps of 18 ft (near balance bar)  Half-foam FWD/BCKWD stepping for foot clearance x multiple reps alt lead LE. Pt with multiple errors (foot does not fully clear hurdle) min assist provided  TherEx:  STS 3x6. Rates medium    PATIENT EDUCATION: Education details: Pt educated throughout session about proper posture and technique with exercises. Improved exercise technique, movement at target joints, use of target muscles after min to mod verbal, visual, tactile cues.  Person educated: Patient Education method: Explanation, Demonstration, and Verbal cues Education comprehension: verbalized understanding, returned demonstration, and needs further education   HOME EXERCISE PROGRAM: 02/04/22: seated RLE hip ABD with green TB, emphasis on eccentric control  Instructed patient in pool exercise: Access Code: [place]: [email or web address] Date: 10/22/2021 Prepared by: Blanche East  Exercises - Seated Knee Extension with Resistance  - 1 x daily - 7 x weekly - 2 sets - 15 reps - Forward and Backward Stepping at [employer]  - 1 x daily - 7 x weekly -  1 sets - 5 reps - Freestyle Swimming with Snorkel Mask  - 1 x daily - 7 x weekly - 1 sets - 2-3 reps - Heel Walking  - 1 x daily - 7 x weekly - 1 sets - 5 reps - Toe Walking  - 1 x daily - 7 x weekly - 1 sets - 5 reps - Single Leg Stance at Pool Wall  - 1 x daily - 7 x weekly - 1 sets - 3-5 reps - 15-30 sec hold   PT Short Term Goals -       PT SHORT TERM GOAL #1   Title Patient will be adherent to HEP at least 3x a week to improve functional strength and balance for better safety at home.    Baseline 9/21: Pt reports compliance with HEP and is confident, 1/3: doing exercise 2x a week; 06/25/21: 3x/week, 5/18: consistent; 8/1: consistent;    Time 4    Period Weeks    Status MET    Target Date 06/25/21      PT SHORT TERM GOAL #2   Title Patient will be independent in  transferring sit<>Stand without pushing on arm rests to improve ability to get up from chair.    Baseline 9/21: pt able to complete STS without use of UEs, 1/3:able to stand but has moderate difficulty requiring increased time; 06/25/21: able to perform indep without hands but requiring use of momentum to attain standing. Remains stable. 5/18: able to stand without UE assist, close supervision, remains stable;    Time 4    Period Weeks    Status MET    Target Date 07/23/21              PT Long Term Goals -      PT LONG TERM GOAL #1   Title Patient (> 24 years old) will complete five times sit to stand test in < 15 seconds indicating an increased LE strength and improved balance.    Baseline 9/21: 29 seconds hands-free 10/10: deferred 10/12: 34 sec hands free; 11/2: 23.8 sec hands-free; 12/7: 33.2 sec hands free, 1/3: 33.92 hands free; 06/25/21: 25.69 sec, 4/13: 15.5 sec hands free, 5/18: 23 sec without UE 6/22: deferred, 8/1: 15.7 sec without UE; 9/7: 15 sec without UE    Time 8    Period Weeks    Status PARTIALLY MET   Target Date  05/11/2022     PT LONG TERM GOAL #2   Title Patient will increase six minute walk test distance to >400 feet for improve gait ability    Baseline 9/21: 368 ft with RW; 10/10: deferred 10/12: 408 ft; 11/2: 476 ft with 4WW, 1/3: 475 feet with RW; 4/13: 450 feet without AD CGA 10/15/21: 460 feet without AD, CGA   Time 8    Period Weeks    Status ACHIEVED    Target Date 02/16/22     PT LONG TERM GOAL #3   Title Patient will increase 10 meter walk test to >1.27ms as to improve gait speed for better community ambulation and to reduce fall risk.    Baseline 9/21: 0.5 m/s with RW; 10/10 deferred 10/12: 0.93 m/s with 47PX 11/2: 0.83 m/s with 41GG 12/7: 0.52 m/s with RW, 1/3: 0.704; 06/25/21: 0.55 m/s with SPC. 5/18: 0.4 m/s without AD , 10/15/21 0.507 m/s without AD, 8/1: 0.64 m/s; 9/7: 0.42 m/s with SPC, 0.43 m/s without AD; 11/30: 0.42 with SPC, 0.43 m/s without AD  Time 8    Period Weeks    Status ONGOING   Target Date 05/11/2022     PT LONG TERM GOAL #4   Title Patient will be independent with ascend/descend 6 steps using single UE in step over step pattern without LOB.    Baseline 9/21:  Patient uses PT stairs (4 steps) with bilateral upper extremity support on handrails for both ascending/descending.  Patient exhibits reciprocal pattern ascending and step to pattern descending. 10/10: deferred; 10/12: ascending/descending recip. steps with BUE support; 11/2: Ascended/descended 8 steps total, reciprocal pattern ascending and step-to descending. Pt uses UUE support throughout with CGA assist.; 12/7: Ascended and descended 8 steps total with UUE support and CGA. Reciprocal stepping ascending and step to descending., 1/3: Deferrred; 06/25/21: able to perform with SUE support and reciprocal pattern asc/desc, but does require close CGA with descending. 5/18: independent with 2 handrails, close supervision with 1 handrai; 10/15/21: requires 2 handrails, 8/1: requires 1 handrail, descends one step at a time; 9/7: uses 1 handrail, recip pattern ascending, able to do recip pattern descending but with one instance of LOB where pt self-corrects; 11/30:   Time 8    Period Weeks    Status PARTIALLY MET   Target Date 05/11/22     PT LONG TERM GOAL #5   Title Patient will demonstrate an improved Berg Balance Score of >45/56 as to demonstrate improved balance with ADLs such as sitting/standing and transfer balance and reduced fall risk.    Baseline 9/21: deferred d/t time; 9/29: 42/56; 10/10: deferred; 10/12: 44/56; 11/2: deferred; 11/7: 46/56, 1/3: deferred    Time 8    Period Weeks    Status MET    Target Date 12/17/21     PT LONG TERM GOAL #6   Title Patient will improve FOTO score to >60% to indicate improved functional mobility with ADLs.    Baseline 9/21: 59; 10/10: 57; 11/2: 60; 12/7: 58%, 1/3: 61%, 4/13: 56% 10/15/21: 58%, 8/1: 56%; 9/7: 67%   Time 8    Period  Weeks    Status MET   Target Date 12/17/21     PT LONG TERM GOAL #7   Title Pt will improve FGA by a minimum of 5 points to indicate clinically significant improvement in reduced risk of falls with walking tasks.    Baseline 06/25/21: 12, 4/13: deferred, 5/18: deferred due to increased ankle discomfort; 6/22: deferred due to elevated BP- will assess next visit; 10/20/21: 8/30, 8/1: 8/30; 9/7: 13/30   Time 8    Period Weeks    Status MET   Target Date 12/17/21   PT LONG TERM GOAL #8  Title Pt will improve FGA to >18/30 to indicate clinically significant improvement in reduced risk of falls with walking tasks and improved balance.  Baseline 9/7: 13/30; 11/30: 14/30  Time 7  Period Weeks   Status PROGRESSING  Target Date 05/11/21            Plan -    Clinical Impression Statement Continued with focus on dynamic balance activities, such as gait without AD and retro-gait. Pt able to perform these activities with CGA-min assist. Pt was able to progress to higher level balance interventions, but did exhibit difficulty with foot clearance exercises and positions that require NBOS. Pt will continue to benefit from skilled therapy to address remaining deficits in order to improve overall QoL and return to PLOF.         Personal Factors and Comorbidities Age  Comorbidities HTN, CKD, fall risk, type 2 diabetes,    Examination-Activity Limitations Lift;Locomotion Level;Squat;Stairs;Stand;Toileting;Transfers;Bathing    Examination-Participation Restrictions Cleaning;Community Activity;Laundry;Meal Prep;Shop;Volunteer;Driving    Stability/Clinical Decision Making Stable/Uncomplicated    Rehab Potential Fair    PT Frequency 2x / week    PT Duration 8 weeks    PT Treatment/Interventions Cryotherapy;Electrical Stimulation;Moist Heat;Gait training;Stair training;Functional mobility training;Therapeutic activities;Therapeutic exercise;Neuromuscular re-education;Balance training;Patient/family  education;Orthotic Fit/Training;Energy conservation    PT Next Visit Plan Balance reactive strategies, interval walking training, stair training, glute strengthening   PT Home Exercise Plan No changes this date    Consulted and Agree with Plan of Care Patient             Ricard Dillon PT, DPT  Physical Therapist- [facility]  03/30/22, 4:26 PM

## 2022-04-01 ENCOUNTER — Encounter: Payer: Medicare HMO | Admitting: Occupational Therapy

## 2022-04-01 ENCOUNTER — Ambulatory Visit: Payer: Medicare HMO

## 2022-04-01 DIAGNOSIS — M6281 Muscle weakness (generalized): Secondary | ICD-10-CM

## 2022-04-01 DIAGNOSIS — R262 Difficulty in walking, not elsewhere classified: Secondary | ICD-10-CM

## 2022-04-01 DIAGNOSIS — R278 Other lack of coordination: Secondary | ICD-10-CM

## 2022-04-01 DIAGNOSIS — R2681 Unsteadiness on feet: Secondary | ICD-10-CM | POA: Diagnosis not present

## 2022-04-01 NOTE — Therapy (Signed)
OUTPATIENT PHYSICAL THERAPY TREATMENT NOTE      Patient Name: Cassandra Schaefer MRN: [id number] DOB:Sep 04, 1928, 88 y.o., female Today's Date: 04/01/2022  PCP: Roetta Sessions MD REFERRING PROVIDER: Roetta Sessions MD   PT End of Session - 04/01/22 1504     Visit Number 122    Number of Visits 125    Date for PT Re-Evaluation 05/11/22    Authorization Type [employer]    Authorization Time Period 02/16/22-05/11/22    Progress Note Due on Visit 120    PT Start Time 5427    PT Stop Time 1430    PT Time Calculation (min) 41 min    Equipment Utilized During Treatment Gait belt    Activity Tolerance Patient tolerated treatment well;No increased pain    Behavior During Therapy WFL for tasks assessed/performed                   Past Medical History:  Diagnosis Date   Anemia    Cough    RESOLVING, NO FEVER   Diabetes mellitus without complication ([place])    Gout    Hypertension    Hypothyroidism    PUD (peptic ulcer disease)    Stroke [facility])    Wears dentures    full upper, partial lower   Past Surgical History:  Procedure Laterality Date   AMPUTATION TOE Left 12/14/2017   Procedure: AMPUTATION TOE-4TH MPJ;  Surgeon: Samara Deist, DPM;  Location: [place];  Service: Podiatry;  Laterality: Left;  IVA LOCAL Diabetic - oral meds   APPENDECTOMY     CATARACT EXTRACTION W/PHACO Right 06/23/2015   Procedure: CATARACT EXTRACTION PHACO AND INTRAOCULAR LENS PLACEMENT (IOC);  Surgeon: Estill Cotta, MD;  Location: [facility];  Service: Ophthalmology;  Laterality: Right;  [place] 01:28 AP% 23.4 CDE 36.14 fluid pack lot # [id number]   CATARACT EXTRACTION W/PHACO Left 07/28/2015   Procedure: CATARACT EXTRACTION PHACO AND INTRAOCULAR LENS PLACEMENT (IOC);  Surgeon: Estill Cotta, MD;  Location: [facility];  Service: Ophthalmology;  Laterality: Left;  [place]    1:29.7 AP%  24.9 CDE   41.32 fluid casette lot #[id number] H exp 09/23/2016   COONOSCOPY     AND ENDOSCOPY   DILATION AND  CURETTAGE OF UTERUS     ENDARTERECTOMY Left 11/13/2020   Procedure: Evacuation of left neck hematoma;  Surgeon: Katha Cabal, MD;  Location: [facility];  Service: Vascular;  Laterality: Left;   ENDARTERECTOMY Left 11/13/2020   Procedure: ENDARTERECTOMY CAROTID;  Surgeon: Algernon Huxley, MD;  Location: [facility];  Service: Vascular;  Laterality: Left;   TONSILLECTOMY     TUBAL LIGATION     Patient Active Problem List   Diagnosis Date Noted   Carotid stenosis, symptomatic, with infarction ([place]) 11/13/2020   Gout flare 11/10/2020   UTI (urinary tract infection) 11/10/2020   Left carotid artery stenosis 11/10/2020   Chronic kidney disease 11/10/2020   Left basal ganglia embolic stroke ([place]) 61/60/7371   PAC (premature atrial contraction)    Pre-procedural cardiovascular examination    Ischemic stroke ([place]) 10/20/2020   TIA (transient ischemic attack) 10/19/2020   Right hemiparesis ([place]) 10/19/2020   Type 2 diabetes mellitus with stage 3 chronic kidney disease, without long-term current use of insulin ([place]) 08/24/2018   Hyperlipidemia, unspecified 07/19/2017   Hypertension 07/19/2017   PUD (peptic ulcer disease) 07/19/2017   Type 2 diabetes mellitus ([place]) 07/19/2017   Personal history of gout 08/12/2016   Anemia, unspecified 04/09/2016   Hypothyroidism, acquired 12/10/2015  REFERRING DIAG: CVA with right hemiparesis  THERAPY DIAG:  Unsteadiness on feet  Other lack of coordination  Difficulty in walking, not elsewhere classified  Muscle weakness (generalized)  Rationale for Evaluation and Treatment Rehabilitation  PERTINENT HISTORY: 88 y.o. female with medical history significant for type 2 diabetes mellitus, essential hypertension, acquired hypothyroidism, hyperlipidemia, stage III chronic kidney disease reports increased right sided weakness on 10/19/20. She was diagnosed with left basal ganglia ischemic stroke. She was discharged to inpatient rehab for 10 days and then  discharged home. Patient was evaluated by outpatient PT on 11/12/20. CVA was due to carotid stenosis. She underwent left carotid endartectomy on 7/21. Procedure went well however patient suffered from left cervical hematoma and had subsequent surgical procedure to stop the bleed and remove the hematoma on 11/13/20. They were concerned with her breathing and therefore intubated her for 1 day, she was in ICU for 2 days and transferred to med/surge on 11/15/20. Patient was discharged home on 11/17/20 and is now returning to outpatient PT. She has had the stiches removed and is healing well. Denies any neck pain or stiffness. She lives with her daughter and has 24/7 caregiver support. She is still using RW for most ambulation. She is able to transfer to bedside commode well and is able to stand short time unsupported. She reports feeling very weak and fatigued. She reports she has been dragging her right foot more. She denies any numbness/tingling; She reports feeling like her legs are wanting to do more but is hesitant because of fear of falling. She is mod I for self care morning routine. She does still receive help with showering. She has been working on increasing her activity but denies any formal exercise.   PRECAUTIONS: Fall risk  SUBJECTIVE:   Pt reports no stumbles/falls. She reports no pain. Blood sugar staying about the same, no issues/concerns reported.   PAIN:  Are you having pain? No   TODAY'S TREATMENT: 04/01/2022  Gait belt donned, CGA-supervision for safety unless otherwise noted   Neuro Re-Ed:  In // bars: FWD/BCKWD gait x multiple reps length of // bars - intermittent UE LTL stepping 8x length of // bars - intermittent UE Progressed to performing FWD/BCKWD and LTL stepping with dual motor task for multiple reps. PT provides up to min assist   Half-foam obstacle clearance FWD/BCKWD and LTL (each direction) 20x for each - requires finger touch support for BCKWD and LTL to  L  Semi-tandem (wide) 30 sec each LE hands-free Semi-tandem (narrow) 30 sec each LE hands-free Tandem 30 sec each LE - 2 finger support  One foot on floor, one on 6" step 2x30 sec for each LE  -progressed to performing with horizontal head turns 15x (turns) in each LE position   TherEx:  STS 1x8, 1x10.    PATIENT EDUCATION: Education details: Pt educated throughout session about proper posture and technique with exercises. Improved exercise technique, movement at target joints, use of target muscles after min to mod verbal, visual, tactile cues.  Person educated: Patient Education method: Explanation, Demonstration, and Verbal cues Education comprehension: verbalized understanding, returned demonstration, and needs further education   HOME EXERCISE PROGRAM:  No updates on this date, pt to continue HEP as previously given 02/04/22: seated RLE hip ABD with green TB, emphasis on eccentric control  Instructed patient in pool exercise: Access Code: [place]: [email or web address] Date: 10/22/2021 Prepared by: Blanche East  Exercises - Seated Knee Extension with Resistance  - 1 x daily - 7  x weekly - 2 sets - 15 reps - Forward and Backward Stepping at [employer]  - 1 x daily - 7 x weekly - 1 sets - 5 reps - Freestyle Swimming with Snorkel Mask  - 1 x daily - 7 x weekly - 1 sets - 2-3 reps - Heel Walking  - 1 x daily - 7 x weekly - 1 sets - 5 reps - Toe Walking  - 1 x daily - 7 x weekly - 1 sets - 5 reps - Single Leg Stance at Pool Wall  - 1 x daily - 7 x weekly - 1 sets - 3-5 reps - 15-30 sec hold   PT Short Term Goals -       PT SHORT TERM GOAL #1   Title Patient will be adherent to HEP at least 3x a week to improve functional strength and balance for better safety at home.    Baseline 9/21: Pt reports compliance with HEP and is confident, 1/3: doing exercise 2x a week; 06/25/21: 3x/week, 5/18: consistent; 8/1: consistent;    Time 4    Period Weeks     Status MET    Target Date 06/25/21      PT SHORT TERM GOAL #2   Title Patient will be independent in transferring sit<>Stand without pushing on arm rests to improve ability to get up from chair.    Baseline 9/21: pt able to complete STS without use of UEs, 1/3:able to stand but has moderate difficulty requiring increased time; 06/25/21: able to perform indep without hands but requiring use of momentum to attain standing. Remains stable. 5/18: able to stand without UE assist, close supervision, remains stable;    Time 4    Period Weeks    Status MET    Target Date 07/23/21              PT Long Term Goals -      PT LONG TERM GOAL #1   Title Patient (> 22 years old) will complete five times sit to stand test in < 15 seconds indicating an increased LE strength and improved balance.    Baseline 9/21: 29 seconds hands-free 10/10: deferred 10/12: 34 sec hands free; 11/2: 23.8 sec hands-free; 12/7: 33.2 sec hands free, 1/3: 33.92 hands free; 06/25/21: 25.69 sec, 4/13: 15.5 sec hands free, 5/18: 23 sec without UE 6/22: deferred, 8/1: 15.7 sec without UE; 9/7: 15 sec without UE    Time 8    Period Weeks    Status PARTIALLY MET   Target Date  05/11/2022     PT LONG TERM GOAL #2   Title Patient will increase six minute walk test distance to >400 feet for improve gait ability    Baseline 9/21: 368 ft with RW; 10/10: deferred 10/12: 408 ft; 11/2: 476 ft with 4WW, 1/3: 475 feet with RW; 4/13: 450 feet without AD CGA 10/15/21: 460 feet without AD, CGA   Time 8    Period Weeks    Status ACHIEVED    Target Date 02/16/22     PT LONG TERM GOAL #3   Title Patient will increase 10 meter walk test to >1.49ms as to improve gait speed for better community ambulation and to reduce fall risk.    Baseline 9/21: 0.5 m/s with RW; 10/10 deferred 10/12: 0.93 m/s with 45BW 11/2: 0.83 m/s with 46OM 12/7: 0.52 m/s with RW, 1/3: 0.704; 06/25/21: 0.55 m/s with SPC. 5/18: 0.4 m/s without AD , 10/15/21 0.507  m/s without AD,  8/1: 0.64 m/s; 9/7: 0.42 m/s with SPC, 0.43 m/s without AD; 11/30: 0.42 with SPC, 0.43 m/s without AD   Time 8    Period Weeks    Status ONGOING   Target Date 05/11/2022     PT LONG TERM GOAL #4   Title Patient will be independent with ascend/descend 6 steps using single UE in step over step pattern without LOB.    Baseline 9/21:  Patient uses PT stairs (4 steps) with bilateral upper extremity support on handrails for both ascending/descending.  Patient exhibits reciprocal pattern ascending and step to pattern descending. 10/10: deferred; 10/12: ascending/descending recip. steps with BUE support; 11/2: Ascended/descended 8 steps total, reciprocal pattern ascending and step-to descending. Pt uses UUE support throughout with CGA assist.; 12/7: Ascended and descended 8 steps total with UUE support and CGA. Reciprocal stepping ascending and step to descending., 1/3: Deferrred; 06/25/21: able to perform with SUE support and reciprocal pattern asc/desc, but does require close CGA with descending. 5/18: independent with 2 handrails, close supervision with 1 handrai; 10/15/21: requires 2 handrails, 8/1: requires 1 handrail, descends one step at a time; 9/7: uses 1 handrail, recip pattern ascending, able to do recip pattern descending but with one instance of LOB where pt self-corrects; 11/30:   Time 8    Period Weeks    Status PARTIALLY MET   Target Date 05/11/22     PT LONG TERM GOAL #5   Title Patient will demonstrate an improved Berg Balance Score of >45/56 as to demonstrate improved balance with ADLs such as sitting/standing and transfer balance and reduced fall risk.    Baseline 9/21: deferred d/t time; 9/29: 42/56; 10/10: deferred; 10/12: 44/56; 11/2: deferred; 11/7: 46/56, 1/3: deferred    Time 8    Period Weeks    Status MET    Target Date 12/17/21     PT LONG TERM GOAL #6   Title Patient will improve FOTO score to >60% to indicate improved functional mobility with ADLs.    Baseline 9/21: 59;  10/10: 57; 11/2: 60; 12/7: 58%, 1/3: 61%, 4/13: 56% 10/15/21: 58%, 8/1: 56%; 9/7: 67%   Time 8    Period Weeks    Status MET   Target Date 12/17/21     PT LONG TERM GOAL #7   Title Pt will improve FGA by a minimum of 5 points to indicate clinically significant improvement in reduced risk of falls with walking tasks.    Baseline 06/25/21: 12, 4/13: deferred, 5/18: deferred due to increased ankle discomfort; 6/22: deferred due to elevated BP- will assess next visit; 10/20/21: 8/30, 8/1: 8/30; 9/7: 13/30   Time 8    Period Weeks    Status MET   Target Date 12/17/21   PT LONG TERM GOAL #8  Title Pt will improve FGA to >18/30 to indicate clinically significant improvement in reduced risk of falls with walking tasks and improved balance.  Baseline 9/7: 13/30; 11/30: 14/30  Time 7  Period Weeks   Status PROGRESSING  Target Date 05/11/21            Plan -    Clinical Impression Statement Pt continues to advance to higher level balance activities, such as gait with dual motor task and limiting UE support, as well as foot clearance interventions. She generally required CGA-min a throughout and intermittent UE support. She noted some fatigue with interventions in BLE. The pt will continue to benefit from skilled therapy to address remaining deficits  in order to improve overall QoL and return to PLOF.         Personal Factors and Comorbidities Age    Comorbidities HTN, CKD, fall risk, type 2 diabetes,    Examination-Activity Limitations Lift;Locomotion Level;Squat;Stairs;Stand;Toileting;Transfers;Bathing    Examination-Participation Restrictions Cleaning;Community Activity;Laundry;Meal Prep;Shop;Volunteer;Driving    Stability/Clinical Decision Making Stable/Uncomplicated    Rehab Potential Fair    PT Frequency 2x / week    PT Duration 8 weeks    PT Treatment/Interventions Cryotherapy;Electrical Stimulation;Moist Heat;Gait training;Stair training;Functional mobility training;Therapeutic  activities;Therapeutic exercise;Neuromuscular re-education;Balance training;Patient/family education;Orthotic Fit/Training;Energy conservation    PT Next Visit Plan Balance reactive strategies, interval walking training, stair training, glute strengthening   PT Home Exercise Plan No changes this date    Consulted and Agree with Plan of Care Patient             Ricard Dillon PT, DPT  Physical Therapist- [facility]  04/01/22, 3:09 PM

## 2022-04-06 ENCOUNTER — Encounter: Payer: Medicare HMO | Admitting: Occupational Therapy

## 2022-04-06 ENCOUNTER — Ambulatory Visit: Payer: Medicare HMO

## 2022-04-06 DIAGNOSIS — R269 Unspecified abnormalities of gait and mobility: Secondary | ICD-10-CM

## 2022-04-06 DIAGNOSIS — I639 Cerebral infarction, unspecified: Secondary | ICD-10-CM

## 2022-04-06 DIAGNOSIS — M6281 Muscle weakness (generalized): Secondary | ICD-10-CM

## 2022-04-06 DIAGNOSIS — R2681 Unsteadiness on feet: Secondary | ICD-10-CM

## 2022-04-06 DIAGNOSIS — R278 Other lack of coordination: Secondary | ICD-10-CM

## 2022-04-06 DIAGNOSIS — R262 Difficulty in walking, not elsewhere classified: Secondary | ICD-10-CM

## 2022-04-06 NOTE — Therapy (Signed)
OUTPATIENT PHYSICAL THERAPY TREATMENT NOTE      Patient Name: Cassandra Schaefer MRN: [id number] DOB:October 26, 1928, 88 y.o., female Today's Date: 04/06/2022  PCP: Roetta Sessions MD REFERRING PROVIDER: Roetta Sessions MD   PT End of Session - 04/06/22 1352     Visit Number [id number]    Number of Visits 125    Date for PT Re-Evaluation 05/11/22    Authorization Type [employer]    Authorization Time Period 02/16/22-05/11/22    Progress Note Due on Visit 120    PT Start Time 9476    PT Stop Time 1430    PT Time Calculation (min) 41 min    Equipment Utilized During Treatment Gait belt    Activity Tolerance Patient tolerated treatment well;No increased pain    Behavior During Therapy WFL for tasks assessed/performed                   Past Medical History:  Diagnosis Date   Anemia    Cough    RESOLVING, NO FEVER   Diabetes mellitus without complication ([place])    Gout    Hypertension    Hypothyroidism    PUD (peptic ulcer disease)    Stroke [facility])    Wears dentures    full upper, partial lower   Past Surgical History:  Procedure Laterality Date   AMPUTATION TOE Left 12/14/2017   Procedure: AMPUTATION TOE-4TH MPJ;  Surgeon: Samara Deist, DPM;  Location: [place];  Service: Podiatry;  Laterality: Left;  IVA LOCAL Diabetic - oral meds   APPENDECTOMY     CATARACT EXTRACTION W/PHACO Right 06/23/2015   Procedure: CATARACT EXTRACTION PHACO AND INTRAOCULAR LENS PLACEMENT (IOC);  Surgeon: Estill Cotta, MD;  Location: [facility];  Service: Ophthalmology;  Laterality: Right;  [place] 01:28 AP% 23.4 CDE 36.14 fluid pack lot # [id number]   CATARACT EXTRACTION W/PHACO Left 07/28/2015   Procedure: CATARACT EXTRACTION PHACO AND INTRAOCULAR LENS PLACEMENT (IOC);  Surgeon: Estill Cotta, MD;  Location: [facility];  Service: Ophthalmology;  Laterality: Left;  [place]    1:29.7 AP%  24.9 CDE   41.32 fluid casette lot #[id number] H exp 09/23/2016   COONOSCOPY     AND ENDOSCOPY   DILATION AND  CURETTAGE OF UTERUS     ENDARTERECTOMY Left 11/13/2020   Procedure: Evacuation of left neck hematoma;  Surgeon: Katha Cabal, MD;  Location: [facility];  Service: Vascular;  Laterality: Left;   ENDARTERECTOMY Left 11/13/2020   Procedure: ENDARTERECTOMY CAROTID;  Surgeon: Algernon Huxley, MD;  Location: [facility];  Service: Vascular;  Laterality: Left;   TONSILLECTOMY     TUBAL LIGATION     Patient Active Problem List   Diagnosis Date Noted   Carotid stenosis, symptomatic, with infarction ([place]) 11/13/2020   Gout flare 11/10/2020   UTI (urinary tract infection) 11/10/2020   Left carotid artery stenosis 11/10/2020   Chronic kidney disease 11/10/2020   Left basal ganglia embolic stroke ([place]) 27/51/7001   PAC (premature atrial contraction)    Pre-procedural cardiovascular examination    Ischemic stroke ([place]) 10/20/2020   TIA (transient ischemic attack) 10/19/2020   Right hemiparesis ([place]) 10/19/2020   Type 2 diabetes mellitus with stage 3 chronic kidney disease, without long-term current use of insulin ([place]) 08/24/2018   Hyperlipidemia, unspecified 07/19/2017   Hypertension 07/19/2017   PUD (peptic ulcer disease) 07/19/2017   Type 2 diabetes mellitus ([place]) 07/19/2017   Personal history of gout 08/12/2016   Anemia, unspecified 04/09/2016   Hypothyroidism, acquired 12/10/2015  REFERRING DIAG: CVA with right hemiparesis  THERAPY DIAG:  Unsteadiness on feet  Other lack of coordination  Difficulty in walking, not elsewhere classified  Muscle weakness (generalized)  Abnormality of gait and mobility  Ischemic stroke [facility])  Rationale for Evaluation and Treatment Rehabilitation  PERTINENT HISTORY: 88 y.o. female with medical history significant for type 2 diabetes mellitus, essential hypertension, acquired hypothyroidism, hyperlipidemia, stage III chronic kidney disease reports increased right sided weakness on 10/19/20. She was diagnosed with left basal ganglia ischemic stroke. She  was discharged to inpatient rehab for 10 days and then discharged home. Patient was evaluated by outpatient PT on 11/12/20. CVA was due to carotid stenosis. She underwent left carotid endartectomy on 7/21. Procedure went well however patient suffered from left cervical hematoma and had subsequent surgical procedure to stop the bleed and remove the hematoma on 11/13/20. They were concerned with her breathing and therefore intubated her for 1 day, she was in ICU for 2 days and transferred to med/surge on 11/15/20. Patient was discharged home on 11/17/20 and is now returning to outpatient PT. She has had the stiches removed and is healing well. Denies any neck pain or stiffness. She lives with her daughter and has 24/7 caregiver support. She is still using RW for most ambulation. She is able to transfer to bedside commode well and is able to stand short time unsupported. She reports feeling very weak and fatigued. She reports she has been dragging her right foot more. She denies any numbness/tingling; She reports feeling like her legs are wanting to do more but is hesitant because of fear of falling. She is mod I for self care morning routine. She does still receive help with showering. She has been working on increasing her activity but denies any formal exercise.   PRECAUTIONS: Fall risk  SUBJECTIVE:   Pt reports no new complaints and no new falls since the last visit.     PAIN:  Are you having pain? No   TODAY'S TREATMENT: 04/06/2022  Gait belt donned, CGA-supervision for safety unless otherwise noted   Neuro Re-Ed:  Tandem stance on airex pad, boxing at speed bag, 60 sec bouts with each LE leading  Ambulation around the gym without AD, x4 laps total before pt started to feel fatigued  KoreBalance TuxRacer during "Practice" session for improving weight shifts and foot/eye coordination.  x2 attempts   PATIENT EDUCATION: Education details: Pt educated throughout session about proper posture and  technique with exercises. Improved exercise technique, movement at target joints, use of target muscles after min to mod verbal, visual, tactile cues.  Person educated: Patient Education method: Explanation, Demonstration, and Verbal cues Education comprehension: verbalized understanding, returned demonstration, and needs further education   HOME EXERCISE PROGRAM:  No updates on this date, pt to continue HEP as previously given 02/04/22: seated RLE hip ABD with green TB, emphasis on eccentric control  Instructed patient in pool exercise: Access Code: [place]: [email or web address] Date: 10/22/2021 Prepared by: Blanche East  Exercises - Seated Knee Extension with Resistance  - 1 x daily - 7 x weekly - 2 sets - 15 reps - Forward and Backward Stepping at [employer]  - 1 x daily - 7 x weekly - 1 sets - 5 reps - Freestyle Swimming with Snorkel Mask  - 1 x daily - 7 x weekly - 1 sets - 2-3 reps - Heel Walking  - 1 x daily - 7 x weekly - 1 sets - 5 reps - Toe Walking  -  1 x daily - 7 x weekly - 1 sets - 5 reps - Single Leg Stance at Pool Wall  - 1 x daily - 7 x weekly - 1 sets - 3-5 reps - 15-30 sec hold   PT Short Term Goals -       PT SHORT TERM GOAL #1   Title Patient will be adherent to HEP at least 3x a week to improve functional strength and balance for better safety at home.    Baseline 9/21: Pt reports compliance with HEP and is confident, 1/3: doing exercise 2x a week; 06/25/21: 3x/week, 5/18: consistent; 8/1: consistent;    Time 4    Period Weeks    Status MET    Target Date 06/25/21      PT SHORT TERM GOAL #2   Title Patient will be independent in transferring sit<>Stand without pushing on arm rests to improve ability to get up from chair.    Baseline 9/21: pt able to complete STS without use of UEs, 1/3:able to stand but has moderate difficulty requiring increased time; 06/25/21: able to perform indep without hands but requiring use of momentum to attain  standing. Remains stable. 5/18: able to stand without UE assist, close supervision, remains stable;    Time 4    Period Weeks    Status MET    Target Date 07/23/21              PT Long Term Goals -      PT LONG TERM GOAL #1   Title Patient (> 11 years old) will complete five times sit to stand test in < 15 seconds indicating an increased LE strength and improved balance.    Baseline 9/21: 29 seconds hands-free 10/10: deferred 10/12: 34 sec hands free; 11/2: 23.8 sec hands-free; 12/7: 33.2 sec hands free, 1/3: 33.92 hands free; 06/25/21: 25.69 sec, 4/13: 15.5 sec hands free, 5/18: 23 sec without UE 6/22: deferred, 8/1: 15.7 sec without UE; 9/7: 15 sec without UE    Time 8    Period Weeks    Status PARTIALLY MET   Target Date  05/11/2022     PT LONG TERM GOAL #2   Title Patient will increase six minute walk test distance to >400 feet for improve gait ability    Baseline 9/21: 368 ft with RW; 10/10: deferred 10/12: 408 ft; 11/2: 476 ft with 4WW, 1/3: 475 feet with RW; 4/13: 450 feet without AD CGA 10/15/21: 460 feet without AD, CGA   Time 8    Period Weeks    Status ACHIEVED    Target Date 02/16/22     PT LONG TERM GOAL #3   Title Patient will increase 10 meter walk test to >1.34ms as to improve gait speed for better community ambulation and to reduce fall risk.    Baseline 9/21: 0.5 m/s with RW; 10/10 deferred 10/12: 0.93 m/s with 43GP 11/2: 0.83 m/s with 44DI 12/7: 0.52 m/s with RW, 1/3: 0.704; 06/25/21: 0.55 m/s with SPC. 5/18: 0.4 m/s without AD , 10/15/21 0.507 m/s without AD, 8/1: 0.64 m/s; 9/7: 0.42 m/s with SPC, 0.43 m/s without AD; 11/30: 0.42 with SPC, 0.43 m/s without AD   Time 8    Period Weeks    Status ONGOING   Target Date 05/11/2022     PT LONG TERM GOAL #4   Title Patient will be independent with ascend/descend 6 steps using single UE in step over step pattern without LOB.  Baseline 9/21:  Patient uses PT stairs (4 steps) with bilateral upper extremity support on  handrails for both ascending/descending.  Patient exhibits reciprocal pattern ascending and step to pattern descending. 10/10: deferred; 10/12: ascending/descending recip. steps with BUE support; 11/2: Ascended/descended 8 steps total, reciprocal pattern ascending and step-to descending. Pt uses UUE support throughout with CGA assist.; 12/7: Ascended and descended 8 steps total with UUE support and CGA. Reciprocal stepping ascending and step to descending., 1/3: Deferrred; 06/25/21: able to perform with SUE support and reciprocal pattern asc/desc, but does require close CGA with descending. 5/18: independent with 2 handrails, close supervision with 1 handrai; 10/15/21: requires 2 handrails, 8/1: requires 1 handrail, descends one step at a time; 9/7: uses 1 handrail, recip pattern ascending, able to do recip pattern descending but with one instance of LOB where pt self-corrects; 11/30:   Time 8    Period Weeks    Status PARTIALLY MET   Target Date 05/11/22     PT LONG TERM GOAL #5   Title Patient will demonstrate an improved Berg Balance Score of >45/56 as to demonstrate improved balance with ADLs such as sitting/standing and transfer balance and reduced fall risk.    Baseline 9/21: deferred d/t time; 9/29: 42/56; 10/10: deferred; 10/12: 44/56; 11/2: deferred; 11/7: 46/56, 1/3: deferred    Time 8    Period Weeks    Status MET    Target Date 12/17/21     PT LONG TERM GOAL #6   Title Patient will improve FOTO score to >60% to indicate improved functional mobility with ADLs.    Baseline 9/21: 59; 10/10: 57; 11/2: 60; 12/7: 58%, 1/3: 61%, 4/13: 56% 10/15/21: 58%, 8/1: 56%; 9/7: 67%   Time 8    Period Weeks    Status MET   Target Date 12/17/21     PT LONG TERM GOAL #7   Title Pt will improve FGA by a minimum of 5 points to indicate clinically significant improvement in reduced risk of falls with walking tasks.    Baseline 06/25/21: 12, 4/13: deferred, 5/18: deferred due to increased ankle discomfort;  6/22: deferred due to elevated BP- will assess next visit; 10/20/21: 8/30, 8/1: 8/30; 9/7: 13/30   Time 8    Period Weeks    Status MET   Target Date 12/17/21   PT LONG TERM GOAL #8  Title Pt will improve FGA to >18/30 to indicate clinically significant improvement in reduced risk of falls with walking tasks and improved balance.  Baseline 9/7: 13/30; 11/30: 14/30  Time 7  Period Weeks   Status PROGRESSING  Target Date 05/11/21            Plan -    Clinical Impression Statement Pt continues to enjoy performing the [facility] activity as it challenges her balance and provides visual feedback of what she needs to do for weight shifting.  Pt is preparing for d/c in 2 visits as per current POC.  Pt to continue with updated HEP given to her at subsequent appointments in order to keep pt mobile and active for improved QoL.           Personal Factors and Comorbidities Age    Comorbidities HTN, CKD, fall risk, type 2 diabetes,    Examination-Activity Limitations Lift;Locomotion Level;Squat;Stairs;Stand;Toileting;Transfers;Bathing    Examination-Participation Restrictions Cleaning;Community Activity;Laundry;Meal Prep;Shop;Volunteer;Driving    Stability/Clinical Decision Making Stable/Uncomplicated    Rehab Potential Fair    PT Frequency 2x / week    PT Duration 8 weeks  PT Treatment/Interventions Cryotherapy;Electrical Stimulation;Moist Heat;Gait training;Stair training;Functional mobility training;Therapeutic activities;Therapeutic exercise;Neuromuscular re-education;Balance training;Patient/family education;Orthotic Fit/Training;Energy conservation    PT Next Visit Plan Balance reactive strategies, interval walking training, stair training, glute strengthening   PT Home Exercise Plan No changes this date    Consulted and Agree with Plan of Care Patient            Gwenlyn Saran, PT, DPT Physical Therapist- [facility]  04/06/22, 1:53 PM

## 2022-04-08 ENCOUNTER — Encounter: Payer: Medicare HMO | Admitting: Occupational Therapy

## 2022-04-08 ENCOUNTER — Ambulatory Visit: Payer: Medicare HMO

## 2022-04-08 DIAGNOSIS — R2681 Unsteadiness on feet: Secondary | ICD-10-CM | POA: Diagnosis not present

## 2022-04-08 DIAGNOSIS — R262 Difficulty in walking, not elsewhere classified: Secondary | ICD-10-CM

## 2022-04-08 DIAGNOSIS — M6281 Muscle weakness (generalized): Secondary | ICD-10-CM

## 2022-04-08 NOTE — Therapy (Signed)
OUTPATIENT PHYSICAL THERAPY TREATMENT NOTE      Patient Name: Taleyah Hillman MRN: [id number] DOB:06/20/1928, 88 y.o., female Today's Date: 04/08/2022  PCP: Roetta Sessions MD REFERRING PROVIDER: Roetta Sessions MD   PT End of Session - 04/08/22 1430     Visit Number 124    Number of Visits 125    Date for PT Re-Evaluation 05/11/22    Authorization Type [employer]    Authorization Time Period 02/16/22-05/11/22    Progress Note Due on Visit 120    PT Start Time 1431    PT Stop Time 1514    PT Time Calculation (min) 43 min    Equipment Utilized During Treatment Gait belt    Activity Tolerance Patient tolerated treatment well;No increased pain    Behavior During Therapy WFL for tasks assessed/performed                    Past Medical History:  Diagnosis Date   Anemia    Cough    RESOLVING, NO FEVER   Diabetes mellitus without complication ([place])    Gout    Hypertension    Hypothyroidism    PUD (peptic ulcer disease)    Stroke [facility])    Wears dentures    full upper, partial lower   Past Surgical History:  Procedure Laterality Date   AMPUTATION TOE Left 12/14/2017   Procedure: AMPUTATION TOE-4TH MPJ;  Surgeon: Samara Deist, DPM;  Location: [place];  Service: Podiatry;  Laterality: Left;  IVA LOCAL Diabetic - oral meds   APPENDECTOMY     CATARACT EXTRACTION W/PHACO Right 06/23/2015   Procedure: CATARACT EXTRACTION PHACO AND INTRAOCULAR LENS PLACEMENT (IOC);  Surgeon: Estill Cotta, MD;  Location: [facility];  Service: Ophthalmology;  Laterality: Right;  [place] 01:28 AP% 23.4 CDE 36.14 fluid pack lot # [id number]   CATARACT EXTRACTION W/PHACO Left 07/28/2015   Procedure: CATARACT EXTRACTION PHACO AND INTRAOCULAR LENS PLACEMENT (IOC);  Surgeon: Estill Cotta, MD;  Location: [facility];  Service: Ophthalmology;  Laterality: Left;  [place]    1:29.7 AP%  24.9 CDE   41.32 fluid casette lot #[id number] H exp 09/23/2016   COONOSCOPY     AND ENDOSCOPY   DILATION AND  CURETTAGE OF UTERUS     ENDARTERECTOMY Left 11/13/2020   Procedure: Evacuation of left neck hematoma;  Surgeon: Katha Cabal, MD;  Location: [facility];  Service: Vascular;  Laterality: Left;   ENDARTERECTOMY Left 11/13/2020   Procedure: ENDARTERECTOMY CAROTID;  Surgeon: Algernon Huxley, MD;  Location: [facility];  Service: Vascular;  Laterality: Left;   TONSILLECTOMY     TUBAL LIGATION     Patient Active Problem List   Diagnosis Date Noted   Carotid stenosis, symptomatic, with infarction ([place]) 11/13/2020   Gout flare 11/10/2020   UTI (urinary tract infection) 11/10/2020   Left carotid artery stenosis 11/10/2020   Chronic kidney disease 11/10/2020   Left basal ganglia embolic stroke ([place]) 54/62/7035   PAC (premature atrial contraction)    Pre-procedural cardiovascular examination    Ischemic stroke ([place]) 10/20/2020   TIA (transient ischemic attack) 10/19/2020   Right hemiparesis ([place]) 10/19/2020   Type 2 diabetes mellitus with stage 3 chronic kidney disease, without long-term current use of insulin ([place]) 08/24/2018   Hyperlipidemia, unspecified 07/19/2017   Hypertension 07/19/2017   PUD (peptic ulcer disease) 07/19/2017   Type 2 diabetes mellitus ([place]) 07/19/2017   Personal history of gout 08/12/2016   Anemia, unspecified 04/09/2016   Hypothyroidism, acquired 12/10/2015  REFERRING DIAG: CVA with right hemiparesis  THERAPY DIAG:  Muscle weakness (generalized)  Unsteadiness on feet  Difficulty in walking, not elsewhere classified  Rationale for Evaluation and Treatment Rehabilitation  PERTINENT HISTORY: 88 y.o. female with medical history significant for type 2 diabetes mellitus, essential hypertension, acquired hypothyroidism, hyperlipidemia, stage III chronic kidney disease reports increased right sided weakness on 10/19/20. She was diagnosed with left basal ganglia ischemic stroke. She was discharged to inpatient rehab for 10 days and then discharged home. Patient was  evaluated by outpatient PT on 11/12/20. CVA was due to carotid stenosis. She underwent left carotid endartectomy on 7/21. Procedure went well however patient suffered from left cervical hematoma and had subsequent surgical procedure to stop the bleed and remove the hematoma on 11/13/20. They were concerned with her breathing and therefore intubated her for 1 day, she was in ICU for 2 days and transferred to med/surge on 11/15/20. Patient was discharged home on 11/17/20 and is now returning to outpatient PT. She has had the stiches removed and is healing well. Denies any neck pain or stiffness. She lives with her daughter and has 24/7 caregiver support. She is still using RW for most ambulation. She is able to transfer to bedside commode well and is able to stand short time unsupported. She reports feeling very weak and fatigued. She reports she has been dragging her right foot more. She denies any numbness/tingling; She reports feeling like her legs are wanting to do more but is hesitant because of fear of falling. She is mod I for self care morning routine. She does still receive help with showering. She has been working on increasing her activity but denies any formal exercise.   PRECAUTIONS: Fall risk  SUBJECTIVE:   Pt reports no pain but some stiffness, thinks this is due to the cold. weather She reports no stumbles/falls.   PAIN:  Are you having pain? No   TODAY'S TREATMENT: 04/08/2022  Gait belt donned, CGA-supervision for safety unless otherwise noted   Neuro Re-Ed:  In // bars: Gait FWD/BCKWD x multiple reps length of bars Gait FWD and with turns utilizing horizontal and vertical head turns - intermittent UE support. Pt completes multiple reps.   Tandem stance on airex pad 2x30 sec each LE with up to min assist provided to correct for LOB  Orange hurdle fwd/bckwd stepping with decreasing levels of UE support (BUE to hand-hovering over bar, however, pt required finger-touch support on bar  for retrostep)  KoreBalance TuxRacer for improving weight shifts and foot/eye coordination.  x3 attempts with BUE support, cuing for post/ant weight shifts, improved accuracy of weight shifts with reps  TherEx:  STS 2x8. Cuing for anterior lean technique. Rates moderate    PATIENT EDUCATION: Education details: Pt educated throughout session about proper posture and technique with exercises. Improved exercise technique, movement at target joints, use of target muscles after min to mod verbal, visual, tactile cues.  Person educated: Patient Education method: Explanation, Demonstration, and Verbal cues Education comprehension: verbalized understanding, returned demonstration, and needs further education   HOME EXERCISE PROGRAM:  No updates on this date, pt to continue HEP as previously given 02/04/22: seated RLE hip ABD with green TB, emphasis on eccentric control  Instructed patient in pool exercise: Access Code: [place]: [email or web address] Date: 10/22/2021 Prepared by: Blanche East  Exercises - Seated Knee Extension with Resistance  - 1 x daily - 7 x weekly - 2 sets - 15 reps - Forward and Backward Stepping at [employer]  Wall  - 1 x daily - 7 x weekly - 1 sets - 5 reps - Freestyle Swimming with Snorkel Mask  - 1 x daily - 7 x weekly - 1 sets - 2-3 reps - Heel Walking  - 1 x daily - 7 x weekly - 1 sets - 5 reps - Toe Walking  - 1 x daily - 7 x weekly - 1 sets - 5 reps - Single Leg Stance at Pool Wall  - 1 x daily - 7 x weekly - 1 sets - 3-5 reps - 15-30 sec hold   PT Short Term Goals -       PT SHORT TERM GOAL #1   Title Patient will be adherent to HEP at least 3x a week to improve functional strength and balance for better safety at home.    Baseline 9/21: Pt reports compliance with HEP and is confident, 1/3: doing exercise 2x a week; 06/25/21: 3x/week, 5/18: consistent; 8/1: consistent;    Time 4    Period Weeks    Status MET    Target Date 06/25/21       PT SHORT TERM GOAL #2   Title Patient will be independent in transferring sit<>Stand without pushing on arm rests to improve ability to get up from chair.    Baseline 9/21: pt able to complete STS without use of UEs, 1/3:able to stand but has moderate difficulty requiring increased time; 06/25/21: able to perform indep without hands but requiring use of momentum to attain standing. Remains stable. 5/18: able to stand without UE assist, close supervision, remains stable;    Time 4    Period Weeks    Status MET    Target Date 07/23/21              PT Long Term Goals -      PT LONG TERM GOAL #1   Title Patient (> 56 years old) will complete five times sit to stand test in < 15 seconds indicating an increased LE strength and improved balance.    Baseline 9/21: 29 seconds hands-free 10/10: deferred 10/12: 34 sec hands free; 11/2: 23.8 sec hands-free; 12/7: 33.2 sec hands free, 1/3: 33.92 hands free; 06/25/21: 25.69 sec, 4/13: 15.5 sec hands free, 5/18: 23 sec without UE 6/22: deferred, 8/1: 15.7 sec without UE; 9/7: 15 sec without UE    Time 8    Period Weeks    Status PARTIALLY MET   Target Date  05/11/2022     PT LONG TERM GOAL #2   Title Patient will increase six minute walk test distance to >400 feet for improve gait ability    Baseline 9/21: 368 ft with RW; 10/10: deferred 10/12: 408 ft; 11/2: 476 ft with 4WW, 1/3: 475 feet with RW; 4/13: 450 feet without AD CGA 10/15/21: 460 feet without AD, CGA   Time 8    Period Weeks    Status ACHIEVED    Target Date 02/16/22     PT LONG TERM GOAL #3   Title Patient will increase 10 meter walk test to >1.21ms as to improve gait speed for better community ambulation and to reduce fall risk.    Baseline 9/21: 0.5 m/s with RW; 10/10 deferred 10/12: 0.93 m/s with 44UJ 11/2: 0.83 m/s with 48JX 12/7: 0.52 m/s with RW, 1/3: 0.704; 06/25/21: 0.55 m/s with SPC. 5/18: 0.4 m/s without AD , 10/15/21 0.507 m/s without AD, 8/1: 0.64 m/s; 9/7: 0.42 m/s with SPC, 0.43  m/s without  AD; 11/30: 0.42 with SPC, 0.43 m/s without AD   Time 8    Period Weeks    Status ONGOING   Target Date 05/11/2022     PT LONG TERM GOAL #4   Title Patient will be independent with ascend/descend 6 steps using single UE in step over step pattern without LOB.    Baseline 9/21:  Patient uses PT stairs (4 steps) with bilateral upper extremity support on handrails for both ascending/descending.  Patient exhibits reciprocal pattern ascending and step to pattern descending. 10/10: deferred; 10/12: ascending/descending recip. steps with BUE support; 11/2: Ascended/descended 8 steps total, reciprocal pattern ascending and step-to descending. Pt uses UUE support throughout with CGA assist.; 12/7: Ascended and descended 8 steps total with UUE support and CGA. Reciprocal stepping ascending and step to descending., 1/3: Deferrred; 06/25/21: able to perform with SUE support and reciprocal pattern asc/desc, but does require close CGA with descending. 5/18: independent with 2 handrails, close supervision with 1 handrai; 10/15/21: requires 2 handrails, 8/1: requires 1 handrail, descends one step at a time; 9/7: uses 1 handrail, recip pattern ascending, able to do recip pattern descending but with one instance of LOB where pt self-corrects; 11/30:   Time 8    Period Weeks    Status PARTIALLY MET   Target Date 05/11/22     PT LONG TERM GOAL #5   Title Patient will demonstrate an improved Berg Balance Score of >45/56 as to demonstrate improved balance with ADLs such as sitting/standing and transfer balance and reduced fall risk.    Baseline 9/21: deferred d/t time; 9/29: 42/56; 10/10: deferred; 10/12: 44/56; 11/2: deferred; 11/7: 46/56, 1/3: deferred    Time 8    Period Weeks    Status MET    Target Date 12/17/21     PT LONG TERM GOAL #6   Title Patient will improve FOTO score to >60% to indicate improved functional mobility with ADLs.    Baseline 9/21: 59; 10/10: 57; 11/2: 60; 12/7: 58%, 1/3: 61%, 4/13:  56% 10/15/21: 58%, 8/1: 56%; 9/7: 67%   Time 8    Period Weeks    Status MET   Target Date 12/17/21     PT LONG TERM GOAL #7   Title Pt will improve FGA by a minimum of 5 points to indicate clinically significant improvement in reduced risk of falls with walking tasks.    Baseline 06/25/21: 12, 4/13: deferred, 5/18: deferred due to increased ankle discomfort; 6/22: deferred due to elevated BP- will assess next visit; 10/20/21: 8/30, 8/1: 8/30; 9/7: 13/30   Time 8    Period Weeks    Status MET   Target Date 12/17/21   PT LONG TERM GOAL #8  Title Pt will improve FGA to >18/30 to indicate clinically significant improvement in reduced risk of falls with walking tasks and improved balance.  Baseline 9/7: 13/30; 11/30: 14/30  Time 7  Period Weeks   Status PROGRESSING  Target Date 05/11/21            Plan -    Clinical Impression Statement Pt exhibits within session improvement in [facility] weight-shifting activity. She has generally showed overall progress as well with performing higher level balance activities. Plan to review HEP with pt next appointment to prepare for discharge. The pt will benefit from further skilled PT to improve strength, balance, gait and mobility.        Personal Factors and Comorbidities Age    Comorbidities HTN, CKD, fall risk, type 2  diabetes,    Examination-Activity Limitations Lift;Locomotion Level;Squat;Stairs;Stand;Toileting;Transfers;Bathing    Examination-Participation Restrictions Cleaning;Community Activity;Laundry;Meal Prep;Shop;Volunteer;Driving    Stability/Clinical Decision Making Stable/Uncomplicated    Rehab Potential Fair    PT Frequency 2x / week    PT Duration 8 weeks    PT Treatment/Interventions Cryotherapy;Electrical Stimulation;Moist Heat;Gait training;Stair training;Functional mobility training;Therapeutic activities;Therapeutic exercise;Neuromuscular re-education;Balance training;Patient/family education;Orthotic Fit/Training;Energy  conservation    PT Next Visit Plan Balance reactive strategies, interval walking training, stair training, glute strengthening   PT Home Exercise Plan No changes this date    Consulted and Agree with Plan of Care Patient            Ricard Dillon PT, DPT  Physical Therapist- [facility]  04/08/22, 4:58 PM

## 2022-04-13 ENCOUNTER — Ambulatory Visit: Payer: Medicare HMO

## 2022-04-13 ENCOUNTER — Encounter: Payer: Medicare HMO | Admitting: Occupational Therapy

## 2022-04-15 ENCOUNTER — Telehealth: Payer: Self-pay

## 2022-04-15 ENCOUNTER — Ambulatory Visit: Payer: Medicare HMO

## 2022-04-15 ENCOUNTER — Encounter: Payer: Medicare HMO | Admitting: Occupational Therapy

## 2022-04-15 NOTE — Telephone Encounter (Signed)
PT contacted pt via secure phone line due to missed visit today. PT left VM informing pt of next appointment date/time along with clinic return contact information.  Temple Pacini PT, DPT

## 2022-04-20 ENCOUNTER — Ambulatory Visit: Payer: Medicare HMO

## 2022-04-20 ENCOUNTER — Encounter: Payer: Medicare HMO | Admitting: Occupational Therapy

## 2022-04-22 ENCOUNTER — Ambulatory Visit: Payer: Medicare HMO

## 2022-04-22 ENCOUNTER — Encounter: Payer: Medicare HMO | Admitting: Occupational Therapy

## 2022-04-27 ENCOUNTER — Ambulatory Visit: Payer: Medicare HMO

## 2022-04-29 ENCOUNTER — Ambulatory Visit: Payer: Medicare HMO

## 2022-05-04 ENCOUNTER — Ambulatory Visit: Payer: Medicare HMO

## 2022-05-06 ENCOUNTER — Ambulatory Visit: Payer: Medicare HMO

## 2022-05-11 ENCOUNTER — Ambulatory Visit: Payer: Medicare HMO

## 2022-05-13 ENCOUNTER — Ambulatory Visit: Payer: Medicare HMO

## 2022-05-18 ENCOUNTER — Ambulatory Visit: Payer: Medicare HMO

## 2022-05-20 ENCOUNTER — Ambulatory Visit: Payer: Medicare HMO

## 2022-05-25 ENCOUNTER — Ambulatory Visit: Payer: Medicare HMO

## 2022-05-27 ENCOUNTER — Ambulatory Visit: Payer: Medicare HMO

## 2022-06-01 ENCOUNTER — Ambulatory Visit: Payer: Medicare HMO

## 2022-06-03 ENCOUNTER — Ambulatory Visit: Payer: Medicare HMO

## 2022-06-08 ENCOUNTER — Ambulatory Visit: Payer: Medicare HMO

## 2022-06-10 ENCOUNTER — Ambulatory Visit: Payer: Medicare HMO

## 2022-06-15 ENCOUNTER — Ambulatory Visit: Payer: Medicare HMO

## 2022-06-17 ENCOUNTER — Ambulatory Visit: Payer: Medicare HMO

## 2022-06-22 ENCOUNTER — Ambulatory Visit: Payer: Medicare HMO

## 2022-06-24 ENCOUNTER — Ambulatory Visit: Payer: Medicare HMO

## 2022-06-29 ENCOUNTER — Ambulatory Visit: Payer: Medicare HMO

## 2022-07-01 ENCOUNTER — Ambulatory Visit: Payer: Medicare HMO

## 2022-07-06 ENCOUNTER — Ambulatory Visit: Payer: Medicare HMO

## 2022-07-08 ENCOUNTER — Ambulatory Visit: Payer: Medicare HMO

## 2022-07-13 ENCOUNTER — Ambulatory Visit: Payer: Medicare HMO

## 2022-07-15 ENCOUNTER — Ambulatory Visit: Payer: Medicare HMO

## 2022-07-20 ENCOUNTER — Ambulatory Visit: Payer: Medicare HMO

## 2022-07-22 ENCOUNTER — Ambulatory Visit: Payer: Medicare HMO

## 2022-07-27 ENCOUNTER — Ambulatory Visit: Payer: Medicare HMO

## 2022-07-29 ENCOUNTER — Ambulatory Visit: Payer: Medicare HMO

## 2022-08-03 ENCOUNTER — Ambulatory Visit: Payer: Medicare HMO

## 2022-08-05 ENCOUNTER — Ambulatory Visit: Payer: Medicare HMO

## 2022-08-10 ENCOUNTER — Ambulatory Visit: Payer: Medicare HMO

## 2022-08-12 ENCOUNTER — Ambulatory Visit: Payer: Medicare HMO

## 2022-08-17 ENCOUNTER — Ambulatory Visit: Payer: Medicare HMO

## 2022-08-19 ENCOUNTER — Ambulatory Visit: Payer: Medicare HMO

## 2022-08-24 ENCOUNTER — Ambulatory Visit: Payer: Medicare HMO

## 2022-08-26 ENCOUNTER — Ambulatory Visit: Payer: Medicare HMO

## 2022-08-31 ENCOUNTER — Ambulatory Visit: Payer: Medicare HMO

## 2022-09-02 ENCOUNTER — Ambulatory Visit: Payer: Medicare HMO

## 2022-09-07 ENCOUNTER — Ambulatory Visit: Payer: Medicare HMO

## 2022-09-09 ENCOUNTER — Ambulatory Visit: Payer: Medicare HMO

## 2022-09-14 ENCOUNTER — Ambulatory Visit: Payer: Medicare HMO

## 2022-09-16 ENCOUNTER — Ambulatory Visit: Payer: Medicare HMO

## 2022-09-21 ENCOUNTER — Ambulatory Visit: Payer: Medicare HMO

## 2022-09-23 ENCOUNTER — Ambulatory Visit: Payer: Medicare HMO

## 2022-09-28 ENCOUNTER — Ambulatory Visit: Payer: Medicare HMO

## 2022-09-30 ENCOUNTER — Ambulatory Visit: Payer: Medicare HMO

## 2022-10-05 ENCOUNTER — Ambulatory Visit: Payer: Medicare HMO

## 2022-10-07 ENCOUNTER — Ambulatory Visit: Payer: Medicare HMO

## 2022-10-12 ENCOUNTER — Ambulatory Visit: Payer: Medicare HMO

## 2022-10-14 ENCOUNTER — Ambulatory Visit: Payer: Medicare HMO

## 2022-10-18 ENCOUNTER — Emergency Department: Payer: Medicare HMO

## 2022-10-18 ENCOUNTER — Other Ambulatory Visit: Payer: Self-pay

## 2022-10-18 ENCOUNTER — Encounter: Payer: Self-pay | Admitting: Emergency Medicine

## 2022-10-18 DIAGNOSIS — S51811A Laceration without foreign body of right forearm, initial encounter: Secondary | ICD-10-CM | POA: Diagnosis not present

## 2022-10-18 DIAGNOSIS — Z5321 Procedure and treatment not carried out due to patient leaving prior to being seen by health care provider: Secondary | ICD-10-CM | POA: Insufficient documentation

## 2022-10-18 DIAGNOSIS — I6782 Cerebral ischemia: Secondary | ICD-10-CM | POA: Diagnosis not present

## 2022-10-18 DIAGNOSIS — W108XXA Fall (on) (from) other stairs and steps, initial encounter: Secondary | ICD-10-CM | POA: Diagnosis not present

## 2022-10-18 DIAGNOSIS — S51812A Laceration without foreign body of left forearm, initial encounter: Secondary | ICD-10-CM | POA: Diagnosis not present

## 2022-10-18 DIAGNOSIS — M25511 Pain in right shoulder: Secondary | ICD-10-CM | POA: Diagnosis present

## 2022-10-18 NOTE — ED Triage Notes (Addendum)
Pt presents ambulatory to triage via POV with complaints of fall. Pt fell going up the stairs and has pain the R shoulder. No longer on thinners. Pt has skin tears to bilateral arms wrapped with non-adherent dressing and coban. Per family, the patient is allergic to tetanus shots. Pt is A&Ox4 at this time - no LOC nor vomiting. Denies CP or SOB.

## 2022-10-19 ENCOUNTER — Ambulatory Visit: Payer: Medicare HMO

## 2022-10-19 ENCOUNTER — Emergency Department
Admission: EM | Admit: 2022-10-19 | Discharge: 2022-10-19 | Discharge status: Against Medical Advice | Payer: Medicare HMO | Attending: Emergency Medicine | Admitting: Emergency Medicine

## 2022-10-21 ENCOUNTER — Ambulatory Visit: Payer: Medicare HMO

## 2022-10-26 ENCOUNTER — Ambulatory Visit: Payer: Medicare HMO

## 2022-11-01 ENCOUNTER — Other Ambulatory Visit (INDEPENDENT_AMBULATORY_CARE_PROVIDER_SITE_OTHER): Payer: Self-pay | Admitting: Nurse Practitioner

## 2022-11-01 DIAGNOSIS — I6523 Occlusion and stenosis of bilateral carotid arteries: Secondary | ICD-10-CM

## 2022-11-02 ENCOUNTER — Ambulatory Visit: Payer: Medicare HMO

## 2022-11-04 ENCOUNTER — Ambulatory Visit: Payer: Medicare HMO

## 2022-11-09 ENCOUNTER — Ambulatory Visit (INDEPENDENT_AMBULATORY_CARE_PROVIDER_SITE_OTHER): Payer: Medicare HMO

## 2022-11-09 ENCOUNTER — Ambulatory Visit: Payer: Medicare HMO

## 2022-11-09 ENCOUNTER — Ambulatory Visit (INDEPENDENT_AMBULATORY_CARE_PROVIDER_SITE_OTHER): Payer: Medicare HMO | Admitting: Vascular Surgery

## 2022-11-09 VITALS — BP 161/61 | HR 73 | Resp 17 | Ht 63.0 in | Wt 121.0 lb

## 2022-11-09 DIAGNOSIS — I10 Essential (primary) hypertension: Secondary | ICD-10-CM

## 2022-11-09 DIAGNOSIS — E119 Type 2 diabetes mellitus without complications: Secondary | ICD-10-CM

## 2022-11-09 DIAGNOSIS — I63239 Cerebral infarction due to unspecified occlusion or stenosis of unspecified carotid arteries: Secondary | ICD-10-CM

## 2022-11-09 DIAGNOSIS — E785 Hyperlipidemia, unspecified: Secondary | ICD-10-CM | POA: Diagnosis not present

## 2022-11-09 DIAGNOSIS — I6523 Occlusion and stenosis of bilateral carotid arteries: Secondary | ICD-10-CM | POA: Diagnosis not present

## 2022-11-09 NOTE — Progress Notes (Signed)
MRN : [id number]  Cassandra Schaefer is a 89 y.o. ([birth year]) female who presents with chief complaint of  Chief Complaint  Patient presents with   Venous Insufficiency  .  History of Present Illness: Patient returns in follow-up of her carotid disease.  She is 2 years status post left carotid endarterectomy with high-grade stenosis and preoperative stroke.  She has had no further neurologic complications since her surgery.  She still has some word finding issues and remains ongoing with therapy to try to improve her baseline functioning.  For a 89 year old woman, she is doing amazingly well.  She continues to live independently.  She is in good spirits today.  Her carotid duplex today shows a patent left carotid endarterectomy without significant recurrent stenosis and stable 1 to 39% right ICA stenosis.  Current Outpatient Medications  Medication Sig Dispense Refill   allopurinol (ZYLOPRIM) 100 MG tablet Take 100 mg by mouth daily.     aspirin 81 MG chewable tablet Chew 1 tablet (81 mg total) by mouth daily.     atorvastatin (LIPITOR) 80 MG tablet Take 1 tablet (80 mg total) by mouth daily after supper. 30 tablet 0   Blood Glucose Monitoring Suppl (FIFTY50 GLUCOSE METER 2.0) w/Device KIT Use as directed Dx code: 250.00     colchicine 0.6 MG tablet Take 0.5 tablets (0.3 mg total) by mouth daily. 30 tablet 0   fosinopril (MONOPRIL) 10 MG tablet Take 1 tablet by mouth daily.     glipiZIDE (GLUCOTROL) 5 MG tablet Take 2.5 mg by mouth 2 (two) times daily.     glucose blood test strip USE TO TEST BLOOD SUGAR DAILY. DX CODE: E11.9     levothyroxine (SYNTHROID) 75 MCG tablet Take 88 mcg by mouth daily.     metFORMIN (GLUCOPHAGE-XR) 500 MG 24 hr tablet Take 1 tablet (500 mg total) by mouth at bedtime. 30 tablet 0   Multiple Vitamins-Minerals (ICAPS AREDS 2 PO) Take by mouth 2 (two) times daily. Reported on 06/23/2015     vitamin B-12 (CYANOCOBALAMIN) 1000 MCG tablet Take 2,000 mcg by mouth daily.       No current facility-administered medications for this visit.    Past Medical History:  Diagnosis Date   Anemia    Cough    RESOLVING, NO FEVER   Diabetes mellitus without complication (HCC)    Gout    Hypertension    Hypothyroidism    PUD (peptic ulcer disease)    Stroke [facility])    Wears dentures    full upper, partial lower    Past Surgical History:  Procedure Laterality Date   AMPUTATION TOE Left 12/14/2017   Procedure: AMPUTATION TOE-4TH MPJ;  Surgeon: Gwyneth Revels, DPM;  Location: [facility];  Service: Podiatry;  Laterality: Left;  IVA LOCAL Diabetic - oral meds   APPENDECTOMY     CATARACT EXTRACTION W/PHACO Right 06/23/2015   Procedure: CATARACT EXTRACTION PHACO AND INTRAOCULAR LENS PLACEMENT (IOC);  Surgeon: Sallee Lange, MD;  Location: [facility];  Service: Ophthalmology;  Laterality: Right;  [place] 01:28 AP% 23.4 CDE 36.14 fluid pack lot # [id number]   CATARACT EXTRACTION W/PHACO Left 07/28/2015   Procedure: CATARACT EXTRACTION PHACO AND INTRAOCULAR LENS PLACEMENT (IOC);  Surgeon: Sallee Lange, MD;  Location: [facility];  Service: Ophthalmology;  Laterality: Left;  [place]    1:29.7 AP%  24.9 CDE   41.32 fluid casette lot #[id number] H exp 09/23/2016   COONOSCOPY     AND ENDOSCOPY   DILATION AND  CURETTAGE OF UTERUS     ENDARTERECTOMY Left 11/13/2020   Procedure: Evacuation of left neck hematoma;  Surgeon: Renford Dills, MD;  Location: [facility];  Service: Vascular;  Laterality: Left;   ENDARTERECTOMY Left 11/13/2020   Procedure: ENDARTERECTOMY CAROTID;  Surgeon: Annice Needy, MD;  Location: [facility];  Service: Vascular;  Laterality: Left;   TONSILLECTOMY     TUBAL LIGATION       Social History   Tobacco Use   Smoking status: Former    Current packs/day: 0.00    Types: Cigarettes    Quit date: 12/26/1983    Years since quitting: 38.8   Smokeless tobacco: Never  Vaping Use   Vaping status: Never Used  Substance Use Topics   Alcohol use: No       Family History  Problem Relation Age of Onset   Breast cancer Neg Hx      Allergies  Allergen Reactions   Penicillins Hives   Sulfa Antibiotics Hives   Tetanus Toxoids Swelling    Per family, syncope   Macrobid [Nitrofurantoin] Nausea And Vomiting and Other (See Comments)    HYPOTENSION   Procaine Other (See Comments)    "went into shock"   Tuberculin Tests Swelling and Rash     REVIEW OF SYSTEMS (Negative unless checked)  Constitutional: [] Weight loss  [] Fever  [] Chills Cardiac: [] Chest pain   [] Chest pressure   [] Palpitations   [] Shortness of breath when laying flat   [] Shortness of breath at rest   [] Shortness of breath with exertion. Vascular:  [] Pain in legs with walking   [] Pain in legs at rest   [] Pain in legs when laying flat   [] Claudication   [] Pain in feet when walking  [] Pain in feet at rest  [] Pain in feet when laying flat   [] History of DVT   [] Phlebitis   [] Swelling in legs   [] Varicose veins   [] Non-healing ulcers Pulmonary:   [] Uses home oxygen   [] Productive cough   [] Hemoptysis   [] Wheeze  [] COPD   [] Asthma Neurologic:  [] Dizziness  [] Blackouts   [] Seizures   [x] History of stroke   [] History of TIA  [] Aphasia   [] Temporary blindness   [] Dysphagia   [] Weakness or numbness in arms   [] Weakness or numbness in legs Musculoskeletal:  [x] Arthritis   [] Joint swelling   [] Joint pain   [] Low back pain Hematologic:  [] Easy bruising  [] Easy bleeding   [] Hypercoagulable state   [x] Anemic  [] Hepatitis Gastrointestinal:  [] Blood in stool   [] Vomiting blood  [x] Gastroesophageal reflux/heartburn   [] Difficulty swallowing. Genitourinary:  [] Chronic kidney disease   [] Difficult urination  [] Frequent urination  [] Burning with urination   [] Blood in urine Skin:  [] Rashes   [] Ulcers   [] Wounds Psychological:  [] History of anxiety   []  History of major depression.  Physical Examination  Vitals:   11/09/22 1345  BP: (!) 161/61  Pulse: 73  Resp: 17  Weight: 121 lb (54.9  kg)  Height: 5\' 3"  (1.6 m)   Body mass index is 21.43 kg/m. Gen:  WD/WN, NAD. Appears younger than stated age. Head: [place]/AT, No temporalis wasting. Ear/Nose/Throat: Hearing grossly intact, nares w/o erythema or drainage, trachea midline Eyes: Conjunctiva clear. Sclera non-icteric Neck: Supple.  No bruit  Pulmonary:  Good air movement, equal and clear to auscultation bilaterally.  Cardiac: RRR, No JVD Vascular:  Vessel Right Left  Radial Palpable Palpable           Musculoskeletal: M/S 5/5 throughout.  No deformity or atrophy. No edema. Walks with a walker.  Neurologic: CN 2-12 intact. Sensation grossly intact in extremities.  Symmetrical.  Speech is fluent. Motor exam as listed above. Psychiatric: Judgment intact, Mood & affect appropriate for pt's clinical situation. Dermatologic: No rashes or ulcers noted.  No cellulitis or open wounds.    CBC Lab Results  Component Value Date   WBC 13.5 (H) 11/17/2020   HGB 9.4 (L) 11/17/2020   HCT 29.0 (L) 11/17/2020   MCV 95.7 11/17/2020   PLT 204 11/17/2020    BMET    Component Value Date/Time   NA 139 11/15/2020 0726   K 4.2 11/15/2020 0726   CL 110 11/15/2020 0726   CO2 21 (L) 11/15/2020 0726   GLUCOSE 136 (H) 11/15/2020 0726   BUN 29 (H) 11/15/2020 0726   CREATININE 1.47 (H) 11/15/2020 0726   CALCIUM 8.6 (L) 11/15/2020 0726   GFRNONAA 34 (L) 11/15/2020 0726   CrCl cannot be calculated (Patient's most recent lab result is older than the maximum 21 days allowed.).  COAG Lab Results  Component Value Date   INR 1.1 10/19/2020    Radiology DG Shoulder Right  Result Date: 10/18/2022 CLINICAL DATA:  Following going up the stairs landing with right shoulder onto the wall EXAM: RIGHT SHOULDER - 2+ VIEW COMPARISON:  None Available. FINDINGS: Demineralization. No acute fracture or dislocation. Degenerative arthritis at the [facility] joint. IMPRESSION: No acute fracture or dislocation. Electronically Signed   By: Minerva Fester M.D.    On: 10/18/2022 22:46   CT HEAD WO CONTRAST ( )  Result Date: 10/18/2022 CLINICAL DATA:  Trauma. EXAM: CT HEAD WITHOUT CONTRAST CT CERVICAL SPINE WITHOUT CONTRAST TECHNIQUE: Multidetector CT imaging of the head and cervical spine was performed following the standard protocol without intravenous contrast. Multiplanar CT image reconstructions of the cervical spine were also generated. RADIATION DOSE REDUCTION: This exam was performed according to the departmental dose-optimization program which includes automated exposure control, adjustment of the mA and/or kV according to patient size and/or use of iterative reconstruction technique. COMPARISON:  Brain MRI dated 10/21/2020. FINDINGS: CT HEAD FINDINGS Brain: Mild age-related atrophy and chronic microvascular ischemic changes. There is no acute intracranial hemorrhage. No mass effect or midline shift. No extra-axial fluid collection. Vascular: No hyperdense vessel or unexpected calcification. Skull: Normal. Negative for fracture or focal lesion. Sinuses/Orbits: No acute finding. Other: None CT CERVICAL SPINE FINDINGS Alignment: No acute subluxation. Skull base and vertebrae: No acute fracture.  Osteopenia. Soft tissues and spinal canal: No prevertebral fluid or swelling. No visible canal hematoma. Disc levels:  No acute findings.  Degenerative changes. Upper chest: Biapical subpleural scarring. Other: Right carotid bulb calcified plaque. Probable left carotid endarterectomy. IMPRESSION: 1. No acute intracranial pathology. Mild age-related atrophy and chronic microvascular ischemic changes. 2. No acute/traumatic cervical spine pathology. Electronically Signed   By: Elgie Collard M.D.   On: 10/18/2022 22:39   CT Cervical Spine Wo Contrast  Result Date: 10/18/2022 CLINICAL DATA:  Trauma. EXAM: CT HEAD WITHOUT CONTRAST CT CERVICAL SPINE WITHOUT CONTRAST TECHNIQUE: Multidetector CT imaging of the head and cervical spine was performed following the standard  protocol without intravenous contrast. Multiplanar CT image reconstructions of the cervical spine were also generated. RADIATION DOSE REDUCTION: This exam was performed according to the departmental dose-optimization program which includes automated exposure control, adjustment of the mA and/or kV according to patient size and/or use of iterative reconstruction technique. COMPARISON:  Brain MRI dated 10/21/2020. FINDINGS: CT HEAD FINDINGS Brain: Mild  age-related atrophy and chronic microvascular ischemic changes. There is no acute intracranial hemorrhage. No mass effect or midline shift. No extra-axial fluid collection. Vascular: No hyperdense vessel or unexpected calcification. Skull: Normal. Negative for fracture or focal lesion. Sinuses/Orbits: No acute finding. Other: None CT CERVICAL SPINE FINDINGS Alignment: No acute subluxation. Skull base and vertebrae: No acute fracture.  Osteopenia. Soft tissues and spinal canal: No prevertebral fluid or swelling. No visible canal hematoma. Disc levels:  No acute findings.  Degenerative changes. Upper chest: Biapical subpleural scarring. Other: Right carotid bulb calcified plaque. Probable left carotid endarterectomy. IMPRESSION: 1. No acute intracranial pathology. Mild age-related atrophy and chronic microvascular ischemic changes. 2. No acute/traumatic cervical spine pathology. Electronically Signed   By: Elgie Collard M.D.   On: 10/18/2022 22:39     Assessment/Plan Carotid stenosis, symptomatic, with infarction [facility]) Her carotid duplex today shows a patent left carotid endarterectomy without significant recurrent stenosis and stable 1 to 39% right ICA stenosis.  Continue aspirin and statin agent.  Recheck carotid duplex in 1 year.   Hypertension blood pressure control important in reducing the progression of atherosclerotic disease. On appropriate oral medications.     Type 2 diabetes mellitus (HCC) blood glucose control important in reducing the  progression of atherosclerotic disease. Also, involved in wound healing. On appropriate medications.     Hyperlipidemia, unspecified lipid control important in reducing the progression of atherosclerotic disease. Continue statin therapy  Festus Barren, MD  11/09/2022 3:22 PM    This note was created with Dragon medical transcription system.  Any errors from dictation are purely unintentional

## 2022-11-09 NOTE — Assessment & Plan Note (Signed)
Her carotid duplex today shows a patent left carotid endarterectomy without significant recurrent stenosis and stable 1 to 39% right ICA stenosis.  Continue aspirin and statin agent.  Recheck carotid duplex in 1 year.

## 2022-11-11 ENCOUNTER — Ambulatory Visit: Payer: Medicare HMO

## 2022-11-16 ENCOUNTER — Ambulatory Visit: Payer: Medicare HMO

## 2022-11-18 ENCOUNTER — Ambulatory Visit: Payer: Medicare HMO

## 2022-12-15 ENCOUNTER — Ambulatory Visit: Payer: Medicare HMO | Admitting: Dermatology

## 2022-12-15 DIAGNOSIS — L89329 Pressure ulcer of left buttock, unspecified stage: Secondary | ICD-10-CM

## 2022-12-15 DIAGNOSIS — L578 Other skin changes due to chronic exposure to nonionizing radiation: Secondary | ICD-10-CM

## 2022-12-15 DIAGNOSIS — L570 Actinic keratosis: Secondary | ICD-10-CM | POA: Diagnosis not present

## 2022-12-15 DIAGNOSIS — L814 Other melanin hyperpigmentation: Secondary | ICD-10-CM

## 2022-12-15 DIAGNOSIS — D229 Melanocytic nevi, unspecified: Secondary | ICD-10-CM

## 2022-12-15 DIAGNOSIS — B079 Viral wart, unspecified: Secondary | ICD-10-CM | POA: Diagnosis not present

## 2022-12-15 DIAGNOSIS — Z7189 Other specified counseling: Secondary | ICD-10-CM

## 2022-12-15 DIAGNOSIS — D1801 Hemangioma of skin and subcutaneous tissue: Secondary | ICD-10-CM

## 2022-12-15 DIAGNOSIS — Z79899 Other long term (current) drug therapy: Secondary | ICD-10-CM

## 2022-12-15 DIAGNOSIS — D485 Neoplasm of uncertain behavior of skin: Secondary | ICD-10-CM

## 2022-12-15 DIAGNOSIS — Z1283 Encounter for screening for malignant neoplasm of skin: Secondary | ICD-10-CM | POA: Diagnosis not present

## 2022-12-15 DIAGNOSIS — D692 Other nonthrombocytopenic purpura: Secondary | ICD-10-CM

## 2022-12-15 DIAGNOSIS — L821 Other seborrheic keratosis: Secondary | ICD-10-CM

## 2022-12-15 DIAGNOSIS — L820 Inflamed seborrheic keratosis: Secondary | ICD-10-CM

## 2022-12-15 DIAGNOSIS — W908XXA Exposure to other nonionizing radiation, initial encounter: Secondary | ICD-10-CM

## 2022-12-15 MED ORDER — TACROLIMUS 0.1 % EX OINT: TOPICAL_OINTMENT | CUTANEOUS | 0 refills | Status: DC

## 2022-12-15 NOTE — Progress Notes (Signed)
Follow-Up Visit   Subjective  Cassandra Schaefer is a 89 y.o. female who presents for the following: Skin Cancer Screening and Full Body Skin Exam  The patient presents for Total-Body Skin Exam (TBSE) for skin cancer screening and mole check. The patient has spots, moles and lesions to be evaluated, some may be new or changing and the patient may have concern these could be cancer.    The following portions of the chart were reviewed this encounter and updated as appropriate: medications, allergies, medical history  Review of Systems:  No other skin or systemic complaints except as noted in HPI or Assessment and Plan.  Objective  Well appearing patient in no apparent distress; mood and affect are within normal limits.  A full examination was performed including scalp, head, eyes, ears, nose, lips, neck, chest, axillae, abdomen, back, buttocks, bilateral upper extremities, bilateral lower extremities, hands, feet, fingers, toes, fingernails, and toenails. All findings within normal limits unless otherwise noted below.   Relevant physical exam findings are noted in the Assessment and Plan.  R upper lip Erythematous thin papules/macules with gritty scale.   R forehead x 1, R cheek x 1 Erythematous stuck-on, waxy papule or plaque  L med buttock Ulceration.  R bicep Cutaneous horn 0.6 cm    Assessment & Plan   SKIN CANCER SCREENING PERFORMED TODAY.  ACTINIC DAMAGE - Chronic condition, secondary to cumulative UV/sun exposure - diffuse scaly erythematous macules with underlying dyspigmentation - Recommend daily broad spectrum sunscreen SPF 30+ to sun-exposed areas, reapply every 2 hours as needed.  - Staying in the shade or wearing long sleeves, sun glasses (UVA+UVB protection) and wide brim hats (4-inch brim around the entire circumference of the hat) are also recommended for sun protection.  - Call for new or changing lesions.  LENTIGINES, SEBORRHEIC KERATOSES, HEMANGIOMAS -  Benign normal skin lesions - Benign-appearing - Call for any changes  MELANOCYTIC NEVI - Tan-brown and/or pink-flesh-colored symmetric macules and papules - Benign appearing on exam today - Observation - Call clinic for new or changing moles - Recommend daily use of broad spectrum spf 30+ sunscreen to sun-exposed areas.   AK (actinic keratosis) R upper lip Actinic keratoses are precancerous spots that appear secondary to cumulative UV radiation exposure/sun exposure over time. They are chronic with expected duration over 1 year. A portion of actinic keratoses will progress to squamous cell carcinoma of the skin. It is not possible to reliably predict which spots will progress to skin cancer and so treatment is recommended to prevent development of skin cancer.  Recommend daily broad spectrum sunscreen SPF 30+ to sun-exposed areas, reapply every 2 hours as needed.  Recommend staying in the shade or wearing long sleeves, sun glasses (UVA+UVB protection) and wide brim hats (4-inch brim around the entire circumference of the hat). Call for new or changing lesions.  Destruction of lesion - R upper lip Complexity: simple   Destruction method: cryotherapy   Informed consent: discussed and consent obtained   Timeout:  patient name, date of birth, surgical site, and procedure verified Lesion destroyed using liquid nitrogen: Yes   Region frozen until ice ball extended beyond lesion: Yes   Outcome: patient tolerated procedure well with no complications   Post-procedure details: wound care instructions given    Inflamed seborrheic keratosis R forehead x 1, R cheek x 1 Symptomatic, irritating, patient would like treated.  Destruction of lesion - R forehead x 1, R cheek x 1 Complexity: simple   Destruction  method: cryotherapy   Informed consent: discussed and consent obtained   Timeout:  patient name, date of birth, surgical site, and procedure verified Lesion destroyed using liquid nitrogen:  Yes   Region frozen until ice ball extended beyond lesion: Yes   Outcome: patient tolerated procedure well with no complications   Post-procedure details: wound care instructions given    Pressure injury of skin of left buttock, unspecified injury stage L med buttock Chronic and persistent condition with duration or expected duration over one year. Condition is symptomatic / bothersome to patient. Not to goal. Start Tacrolimus 0.1% ointment to aa's QD-BID  Neoplasm of uncertain behavior of skin R bicep Skin excision  Lesion length (cm):  0.6 Lesion width (cm):  0.6 Margin per side (cm):  0 Total excision diameter (cm):  0.6 Informed consent: discussed and consent obtained   Timeout: patient name, date of birth, surgical site, and procedure verified   Procedure prep:  Patient was prepped and draped in usual sterile fashion Prep type:  Isopropyl alcohol and povidone-iodine Anesthesia: the lesion was anesthetized in a standard fashion   Anesthetic:  1% lidocaine w/ epinephrine 1-100,000 buffered w/ 8.4% NaHCO3 Hemostasis achieved with: pressure   Hemostasis achieved with comment:  Electrocautery Outcome: patient tolerated procedure well with no complications   Post-procedure details: sterile dressing applied and wound care instructions given   Dressing type: bandage and pressure dressing    Specimen 1 - Surgical pathology Differential Diagnosis: D48.5 r/o SCC vs other Check Margins: No   Purpura - Chronic; persistent and recurrent.  Treatable, but not curable. - Violaceous macules and patches - Benign - Related to trauma, age, sun damage and/or use of blood thinners, chronic use of topical and/or oral steroids - Observe - Can use OTC arnica containing moisturizer such as Dermend Bruise Formula if desired - Call for worsening or other concerns  Return in about 1 year (around 12/15/2023) for TBSE.  Maylene Roes, CMA, am acting as scribe for Armida Sans, MD  .  Documentation: I have reviewed the above documentation for accuracy and completeness, and I agree with the above.  Armida Sans, MD

## 2022-12-15 NOTE — Patient Instructions (Addendum)

## 2022-12-21 ENCOUNTER — Telehealth: Payer: Self-pay

## 2022-12-21 NOTE — Telephone Encounter (Signed)
Left message on voicemail to return my call.  

## 2022-12-21 NOTE — Telephone Encounter (Signed)
-----   Message from Armida Sans sent at 12/21/2022  5:27 PM EDT ----- Diagnosis Skin (M), right bicep VERRUCA VULGARIS, IRRITATED  Benign viral wart May recur No further treatment unless recurs.

## 2022-12-23 ENCOUNTER — Telehealth: Payer: Self-pay

## 2022-12-23 ENCOUNTER — Encounter: Payer: Self-pay | Admitting: Dermatology

## 2022-12-23 NOTE — Telephone Encounter (Signed)
-----   Message from Cassandra Schaefer sent at 12/21/2022  5:27 PM EDT ----- Diagnosis Skin (M), right bicep VERRUCA VULGARIS, IRRITATED  Benign viral wart May recur No further treatment unless recurs.

## 2022-12-23 NOTE — Telephone Encounter (Signed)
Left message on voicemail to return my call.  

## 2022-12-28 ENCOUNTER — Telehealth: Payer: Self-pay

## 2022-12-28 NOTE — Telephone Encounter (Signed)
-----   Message from Cassandra Schaefer sent at 12/21/2022  5:27 PM EDT ----- Diagnosis Skin (M), right bicep VERRUCA VULGARIS, IRRITATED  Benign viral wart May recur No further treatment unless recurs.

## 2022-12-28 NOTE — Telephone Encounter (Signed)
Left voice mail to return my call. Letter sent.  ?

## 2023-03-29 ENCOUNTER — Other Ambulatory Visit: Payer: Self-pay | Admitting: Dermatology

## 2023-03-29 MED ORDER — TACROLIMUS 0.1 % EX OINT: TOPICAL_OINTMENT | CUTANEOUS | 0 refills | Status: DC

## 2023-04-25 ENCOUNTER — Other Ambulatory Visit: Payer: Self-pay | Admitting: Dermatology

## 2023-06-27 ENCOUNTER — Other Ambulatory Visit: Payer: Self-pay | Admitting: Dermatology

## 2023-06-27 MED ORDER — TACROLIMUS 0.1 % EX OINT: TOPICAL_OINTMENT | CUTANEOUS | 0 refills | Status: DC

## 2023-09-13 ENCOUNTER — Encounter (INDEPENDENT_AMBULATORY_CARE_PROVIDER_SITE_OTHER): Payer: Self-pay

## 2023-11-04 ENCOUNTER — Other Ambulatory Visit (INDEPENDENT_AMBULATORY_CARE_PROVIDER_SITE_OTHER): Payer: Self-pay | Admitting: Vascular Surgery

## 2023-11-04 DIAGNOSIS — M79606 Pain in leg, unspecified: Secondary | ICD-10-CM

## 2023-11-08 ENCOUNTER — Other Ambulatory Visit (INDEPENDENT_AMBULATORY_CARE_PROVIDER_SITE_OTHER)

## 2023-11-08 ENCOUNTER — Encounter (INDEPENDENT_AMBULATORY_CARE_PROVIDER_SITE_OTHER): Payer: Self-pay | Admitting: Vascular Surgery

## 2023-11-08 ENCOUNTER — Ambulatory Visit (INDEPENDENT_AMBULATORY_CARE_PROVIDER_SITE_OTHER): Payer: Medicare HMO

## 2023-11-08 ENCOUNTER — Ambulatory Visit (INDEPENDENT_AMBULATORY_CARE_PROVIDER_SITE_OTHER): Payer: Medicare HMO | Admitting: Vascular Surgery

## 2023-11-08 VITALS — BP 163/74 | HR 68 | Ht 63.0 in | Wt 116.4 lb

## 2023-11-08 DIAGNOSIS — M79606 Pain in leg, unspecified: Secondary | ICD-10-CM

## 2023-11-08 DIAGNOSIS — I10 Essential (primary) hypertension: Secondary | ICD-10-CM

## 2023-11-08 DIAGNOSIS — E785 Hyperlipidemia, unspecified: Secondary | ICD-10-CM | POA: Diagnosis not present

## 2023-11-08 DIAGNOSIS — I70213 Atherosclerosis of native arteries of extremities with intermittent claudication, bilateral legs: Secondary | ICD-10-CM

## 2023-11-08 DIAGNOSIS — E119 Type 2 diabetes mellitus without complications: Secondary | ICD-10-CM | POA: Diagnosis not present

## 2023-11-08 DIAGNOSIS — I63239 Cerebral infarction due to unspecified occlusion or stenosis of unspecified carotid arteries: Secondary | ICD-10-CM

## 2023-11-08 DIAGNOSIS — I70219 Atherosclerosis of native arteries of extremities with intermittent claudication, unspecified extremity: Secondary | ICD-10-CM | POA: Insufficient documentation

## 2023-11-08 NOTE — Progress Notes (Signed)
 MRN : [id number]  Cassandra Schaefer is a 89 y.o. (28-Oct-1928) female who presents with chief complaint of  Chief Complaint  Patient presents with   1 year carotid see jd  .  History of Present Illness: Patient returns today in follow up of multiple issues.  She has a few years status post left carotid endarterectomy for high-grade stenosis with previous stroke.  She is currently doing well without any focal neurologic symptoms.  Her carotid duplex today shows 1 to 39% right ICA stenosis and a patent left carotid endarterectomy without hemodynamically significant restenosis.   She has also been having more trouble with her legs.  Her walking is extremely limited for a variety of reasons but her legs do tired and fatigued easily.  She complains that they are very cold.  She generally gets under heating blankets and this is tolerable.  She does not have any open wounds or signs of infection of the feet.  Evaluation of the lower extremities today was performed with both arterial and venous studies.  Her venous studies were largely unrevealing with no evidence of DVT, superficial thrombophlebitis or clinically significant venous reflux.  The only reflux was focally at the left saphenofemoral junction.  Her ABIs however were markedly abnormal.  Her right ABI 0.57 with a digit pressures.  Her left ABI was 0.51 with a digit pressure of 67.  Current Outpatient Medications  Medication Sig Dispense Refill   glipiZIDE (GLUCOTROL) 5 MG tablet Take 2.5 mg by mouth 2 (two) times daily. (Patient taking differently: Take 2.5 mg by mouth daily before breakfast.)     allopurinol (ZYLOPRIM) 100 MG tablet Take 100 mg by mouth daily.     aspirin  81 MG chewable tablet Chew 1 tablet (81 mg total) by mouth daily.     atorvastatin  (LIPITOR ) 80 MG tablet Take 1 tablet (80 mg total) by mouth daily after supper. 30 tablet 0   Blood Glucose Monitoring Suppl (FIFTY50 GLUCOSE METER 2.0) w/Device KIT Use as directed Dx code:  250.00     colchicine  0.6 MG tablet Take 0.5 tablets (0.3 mg total) by mouth daily. 30 tablet 0   fosinopril (MONOPRIL) 10 MG tablet Take 1 tablet by mouth daily.     glucose blood test strip USE TO TEST BLOOD SUGAR DAILY. DX CODE: E11.9     levothyroxine  (SYNTHROID ) 75 MCG tablet Take 88 mcg by mouth daily.     metFORMIN  (GLUCOPHAGE -XR) 500 MG 24 hr tablet Take 1 tablet (500 mg total) by mouth at bedtime. 30 tablet 0   Multiple Vitamins-Minerals (ICAPS AREDS 2 PO) Take by mouth 2 (two) times daily. Reported on 06/23/2015     tacrolimus  (PROTOPIC ) 0.1 % ointment Apply to aa ulcer QD-BID PRN. 60 g 0   vitamin B-12 (CYANOCOBALAMIN ) 1000 MCG tablet Take 2,000 mcg by mouth daily.      No current facility-administered medications for this visit.    Past Medical History:  Diagnosis Date   Anemia    Cough    RESOLVING, NO FEVER   Diabetes mellitus without complication (HCC)    Gout    Hypertension    Hypothyroidism    PUD (peptic ulcer disease)    Stroke [facility])    Wears dentures    full upper, partial lower    Past Surgical History:  Procedure Laterality Date   AMPUTATION TOE Left 12/14/2017   Procedure: AMPUTATION TOE-4TH MPJ;  Surgeon: Ashley Soulier, DPM;  Location: [facility];  Service:  Podiatry;  Laterality: Left;  IVA LOCAL Diabetic - oral meds   APPENDECTOMY     CATARACT EXTRACTION W/PHACO Right 06/23/2015   Procedure: CATARACT EXTRACTION PHACO AND INTRAOCULAR LENS PLACEMENT (IOC);  Surgeon: Steven Dingeldein, MD;  Location: [facility];  Service: Ophthalmology;  Laterality: Right;  US  01:28 AP% 23.4 CDE 36.14 fluid pack lot # [id number]   CATARACT EXTRACTION W/PHACO Left 07/28/2015   Procedure: CATARACT EXTRACTION PHACO AND INTRAOCULAR LENS PLACEMENT (IOC);  Surgeon: Steven Dingeldein, MD;  Location: [facility];  Service: Ophthalmology;  Laterality: Left;  US     1:29.7 AP%  24.9 CDE   41.32 fluid casette lot #[id number] H exp 09/23/2016   COONOSCOPY     AND ENDOSCOPY   DILATION  AND CURETTAGE OF UTERUS     ENDARTERECTOMY Left 11/13/2020   Procedure: Evacuation of left neck hematoma;  Surgeon: Jama Cordella MATSU, MD;  Location: [facility];  Service: Vascular;  Laterality: Left;   ENDARTERECTOMY Left 11/13/2020   Procedure: ENDARTERECTOMY CAROTID;  Surgeon: Marea Selinda RAMAN, MD;  Location: [facility];  Service: Vascular;  Laterality: Left;   TONSILLECTOMY     TUBAL LIGATION       Social History   Tobacco Use   Smoking status: Former    Current packs/day: 0.00    Types: Cigarettes    Quit date: 12/26/1983    Years since quitting: 39.8   Smokeless tobacco: Never  Vaping Use   Vaping status: Never Used  Substance Use Topics   Alcohol use: No      Family History  Problem Relation Age of Onset   Breast cancer Neg Hx      Allergies  Allergen Reactions   Penicillins Hives   Sulfa Antibiotics Hives   Tetanus Toxoids Swelling    Per family, syncope   Macrobid [Nitrofurantoin] Nausea And Vomiting and Other (See Comments)    HYPOTENSION   Procaine Other (See Comments)    went into shock   Tuberculin Tests Swelling and Rash     REVIEW OF SYSTEMS (Negative unless checked)   Constitutional: [] Weight loss  [] Fever  [] Chills Cardiac: [] Chest pain   [] Chest pressure   [] Palpitations   [] Shortness of breath when laying flat   [] Shortness of breath at rest   [] Shortness of breath with exertion. Vascular:  [] Pain in legs with walking   [] Pain in legs at rest   [] Pain in legs when laying flat   [] Claudication   [] Pain in feet when walking  [] Pain in feet at rest  [] Pain in feet when laying flat   [] History of DVT   [] Phlebitis   [] Swelling in legs   [] Varicose veins   [] Non-healing ulcers Pulmonary:   [] Uses home oxygen   [] Productive cough   [] Hemoptysis   [] Wheeze  [] COPD   [] Asthma Neurologic:  [] Dizziness  [] Blackouts   [] Seizures   [x] History of stroke   [] History of TIA  [] Aphasia   [] Temporary blindness   [] Dysphagia   [] Weakness or numbness in arms   [] Weakness  or numbness in legs Musculoskeletal:  [x] Arthritis   [] Joint swelling   [] Joint pain   [] Low back pain Hematologic:  [] Easy bruising  [] Easy bleeding   [] Hypercoagulable state   [x] Anemic  [] Hepatitis Gastrointestinal:  [] Blood in stool   [] Vomiting blood  [x] Gastroesophageal reflux/heartburn   [] Difficulty swallowing. Genitourinary:  [] Chronic kidney disease   [] Difficult urination  [] Frequent urination  [] Burning with urination   [] Blood in urine Skin:  [] Rashes   [] Ulcers   [] Wounds  Psychological:  [] History of anxiety   []  History of major depression.  Physical Examination  BP (!) 163/74   Pulse 68   Ht 5' 3 (1.6 m)   Wt 116 lb 6 oz (52.8 kg)   BMI 20.61 kg/m  Gen:  WD/WN, NAD Head: [place]/AT, No temporalis wasting. Ear/Nose/Throat: Hearing grossly intact, nares w/o erythema or drainage Eyes: Conjunctiva clear. Sclera non-icteric Neck: Supple.  Trachea midline Pulmonary:  Good air movement, no use of accessory muscles.  Cardiac: RRR, no JVD Vascular:  Vessel Right Left  Radial Palpable Palpable                          PT trace Palpable Not Palpable  DP 1+ Palpable 1+ Palpable   Gastrointestinal: soft, non-tender/non-distended. No guarding/reflex.  Musculoskeletal: M/S 5/5 throughout.  No deformity or atrophy. No edema. Neurologic: Sensation grossly intact in extremities.  Symmetrical.  Speech is fluent.  Psychiatric: Judgment intact, Mood & affect appropriate for pt's clinical situation. Dermatologic: No rashes or ulcers noted.  No cellulitis or open wounds.     Labs No results found for this or any previous visit (from the past 2160 hours).  Radiology No results found.  Assessment/Plan  Carotid stenosis, symptomatic, with infarction (HCC)  Her carotid duplex today shows 1 to 39% right ICA stenosis and a patent left carotid endarterectomy without hemodynamically significant restenosis.  Continue aspirin  and statin agent.  Recheck in 1 year.  Atherosclerosis of  native arteries of extremity with intermittent claudication (HCC) Her venous studies were largely unrevealing with no evidence of DVT, superficial thrombophlebitis or clinically significant venous reflux.  The only reflux was focally at the left saphenofemoral junction.  Her ABIs however were markedly abnormal.  Her right ABI 0.57 with a digit pressures.  Her left ABI was 0.51 with a digit pressure of 67. She does describe symptoms of claudication to some degree in terms of fatigue and weakness in her legs with activity.  She does not seem to have ischemic rest pain when she dangles her feet or has pain constantly.  She does not have ulceration or infection.  I had a long discussion today with she as well as her daughter who is a Engineer, civil (consulting) in our vascular department who is very familiar with arterial disease.  Given her relatively mild symptoms at this point and her advanced age and other comorbidities, I think continued medical management with aspirin  and a statin agent and observation would be reasonable.  The patient does not desire any intervention and for this as well.  Should she develop any limb threatening symptoms of true ischemic rest pain, ulceration, or infection, we may have to consider revascularization but I would try to hold on that possible.  She would be a very poor surgical candidate given her age and other comorbidities.  They voiced their understanding and are agreeable with the plan of care.  Recheck ABIs in 3 to 4 months.  Hypertension blood pressure control important in reducing the progression of atherosclerotic disease. On appropriate oral medications.     Type 2 diabetes mellitus (HCC) blood glucose control important in reducing the progression of atherosclerotic disease. Also, involved in wound healing. On appropriate medications.     Hyperlipidemia, unspecified lipid control important in reducing the progression of atherosclerotic disease. Continue statin therapy  Selinda Gu,  MD  11/08/2023 3:20 PM    This note was created with Dragon medical transcription system.  Any errors from  dictation are purely unintentional

## 2023-11-08 NOTE — Assessment & Plan Note (Signed)
 Her carotid duplex today shows 1 to 39% right ICA stenosis and a patent left carotid endarterectomy without hemodynamically significant restenosis.  Continue aspirin  and statin agent.  Recheck in 1 year.

## 2023-11-08 NOTE — Assessment & Plan Note (Signed)
 Her venous studies were largely unrevealing with no evidence of DVT, superficial thrombophlebitis or clinically significant venous reflux.  The only reflux was focally at the left saphenofemoral junction.  Her ABIs however were markedly abnormal.  Her right ABI 0.57 with a digit pressures.  Her left ABI was 0.51 with a digit pressure of 67. She does describe symptoms of claudication to some degree in terms of fatigue and weakness in her legs with activity.  She does not seem to have ischemic rest pain when she dangles her feet or has pain constantly.  She does not have ulceration or infection.  I had a long discussion today with she as well as her daughter who is a Engineer, civil (consulting) in our vascular department who is very familiar with arterial disease.  Given her relatively mild symptoms at this point and her advanced age and other comorbidities, I think continued medical management with aspirin  and a statin agent and observation would be reasonable.  The patient does not desire any intervention and for this as well.  Should she develop any limb threatening symptoms of true ischemic rest pain, ulceration, or infection, we may have to consider revascularization but I would try to hold on that possible.  She would be a very poor surgical candidate given her age and other comorbidities.  They voiced their understanding and are agreeable with the plan of care.  Recheck ABIs in 3 to 4 months.

## 2023-11-09 LAB — VAS US ABI WITH/WO TBI
Left ABI: 0.51
Right ABI: 0.57

## 2023-12-28 ENCOUNTER — Ambulatory Visit: Payer: Medicare HMO | Admitting: Dermatology

## 2024-01-09 ENCOUNTER — Ambulatory Visit: Admitting: Dermatology

## 2024-01-09 DIAGNOSIS — D229 Melanocytic nevi, unspecified: Secondary | ICD-10-CM

## 2024-01-09 DIAGNOSIS — C44629 Squamous cell carcinoma of skin of left upper limb, including shoulder: Secondary | ICD-10-CM | POA: Diagnosis not present

## 2024-01-09 DIAGNOSIS — Z7189 Other specified counseling: Secondary | ICD-10-CM

## 2024-01-09 DIAGNOSIS — C4492 Squamous cell carcinoma of skin, unspecified: Secondary | ICD-10-CM

## 2024-01-09 DIAGNOSIS — L578 Other skin changes due to chronic exposure to nonionizing radiation: Secondary | ICD-10-CM

## 2024-01-09 DIAGNOSIS — L570 Actinic keratosis: Secondary | ICD-10-CM

## 2024-01-09 DIAGNOSIS — Z1283 Encounter for screening for malignant neoplasm of skin: Secondary | ICD-10-CM

## 2024-01-09 DIAGNOSIS — L814 Other melanin hyperpigmentation: Secondary | ICD-10-CM

## 2024-01-09 DIAGNOSIS — L820 Inflamed seborrheic keratosis: Secondary | ICD-10-CM

## 2024-01-09 DIAGNOSIS — D692 Other nonthrombocytopenic purpura: Secondary | ICD-10-CM

## 2024-01-09 DIAGNOSIS — L821 Other seborrheic keratosis: Secondary | ICD-10-CM

## 2024-01-09 DIAGNOSIS — D485 Neoplasm of uncertain behavior of skin: Secondary | ICD-10-CM

## 2024-01-09 DIAGNOSIS — L89329 Pressure ulcer of left buttock, unspecified stage: Secondary | ICD-10-CM

## 2024-01-09 DIAGNOSIS — Z79899 Other long term (current) drug therapy: Secondary | ICD-10-CM

## 2024-01-09 DIAGNOSIS — W908XXA Exposure to other nonionizing radiation, initial encounter: Secondary | ICD-10-CM

## 2024-01-09 HISTORY — DX: Squamous cell carcinoma of skin, unspecified: C44.92

## 2024-01-09 NOTE — Patient Instructions (Addendum)

## 2024-01-09 NOTE — Progress Notes (Unsigned)
 Follow-Up Visit   Subjective  Cassandra Schaefer is a 89 y.o. female who presents for the following: Skin Cancer Screening and Full Body Skin Exam  The patient presents for Total-Body Skin Exam (TBSE) for skin cancer screening and mole check. The patient has spots, moles and lesions to be evaluated, some may be new or changing and the patient may have concern these could be cancer.  The following portions of the chart were reviewed this encounter and updated as appropriate: medications, allergies, medical history  Review of Systems:  No other skin or systemic complaints except as noted in HPI or Assessment and Plan.  Objective  Well appearing patient in no apparent distress; mood and affect are within normal limits.  A full examination was performed including scalp, head, eyes, ears, nose, lips, neck, chest, axillae, abdomen, back, buttocks, bilateral upper extremities, bilateral lower extremities, hands, feet, fingers, toes, fingernails, and toenails. All findings within normal limits unless otherwise noted below.   Relevant physical exam findings are noted in the Assessment and Plan.  L lat forearm near the elbow 1.1 cm hyperkeratotic papule.   Forehead x 1, R chest x 1, L chest x 1 Erythematous stuck-on, waxy papule or plaque B/L arms and hands x 20 (20) Erythematous thin papules/macules with gritty scale.   Assessment & Plan   SKIN CANCER SCREENING PERFORMED TODAY.  ACTINIC DAMAGE - Chronic condition, secondary to cumulative UV/sun exposure - diffuse scaly erythematous macules with underlying dyspigmentation - Recommend daily broad spectrum sunscreen SPF 30+ to sun-exposed areas, reapply every 2 hours as needed.  - Staying in the shade or wearing long sleeves, sun glasses (UVA+UVB protection) and wide brim hats (4-inch brim around the entire circumference of the hat) are also recommended for sun protection.  - Call for new or changing lesions.  LENTIGINES, SEBORRHEIC  KERATOSES, HEMANGIOMAS - Benign normal skin lesions - Benign-appearing - Call for any changes  MELANOCYTIC NEVI - Tan-brown and/or pink-flesh-colored symmetric macules and papules - Benign appearing on exam today - Observation - Call clinic for new or changing moles - Recommend daily use of broad spectrum spf 30+ sunscreen to sun-exposed areas.  NEOPLASM OF UNCERTAIN BEHAVIOR OF SKIN L lat forearm near the elbow Epidermal / dermal shaving  Lesion diameter (cm):  1.1 Informed consent: discussed and consent obtained   Timeout: patient name, date of birth, surgical site, and procedure verified   Procedure prep:  Patient was prepped and draped in usual sterile fashion Prep type:  Isopropyl alcohol Anesthesia: the lesion was anesthetized in a standard fashion   Anesthetic:  1% lidocaine  w/ epinephrine  1-100,000 buffered w/ 8.4% NaHCO3 Instrument used: flexible razor blade   Hemostasis achieved with: pressure, aluminum chloride and electrodesiccation   Outcome: patient tolerated procedure well   Post-procedure details: sterile dressing applied and wound care instructions given   Dressing type: bandage (Mupirocin 2% ointment)    Destruction of lesion Complexity: extensive   Destruction method: electrodesiccation and curettage   Informed consent: discussed and consent obtained   Timeout:  patient name, date of birth, surgical site, and procedure verified Procedure prep:  Patient was prepped and draped in usual sterile fashion Prep type:  Isopropyl alcohol Anesthesia: the lesion was anesthetized in a standard fashion   Anesthetic:  1% lidocaine  w/ epinephrine  1-100,000 buffered w/ 8.4% NaHCO3 Curettage performed in three different directions: Yes   Electrodesiccation performed over the curetted area: Yes   Lesion length (cm):  1.1 Lesion width (cm):  1.1 Margin  per side (cm):  0.2 Final wound size (cm):  1.5 Hemostasis achieved with:  pressure, aluminum chloride and  electrodesiccation Outcome: patient tolerated procedure well with no complications   Post-procedure details: sterile dressing applied and wound care instructions given   Dressing type: bandage and petrolatum    INFLAMED SEBORRHEIC KERATOSIS Forehead x 1, R chest x 1, L chest x 1 Symptomatic, irritating, patient would like treated.  Destruction of lesion - Forehead x 1, R chest x 1, L chest x 1 Complexity: simple   Destruction method: cryotherapy   Informed consent: discussed and consent obtained   Timeout:  patient name, date of birth, surgical site, and procedure verified Lesion destroyed using liquid nitrogen: Yes   Region frozen until ice ball extended beyond lesion: Yes   Outcome: patient tolerated procedure well with no complications   Post-procedure details: wound care instructions given    AK (ACTINIC KERATOSIS) (20) B/L arms and hands x 20 (20) Actinic keratoses are precancerous spots that appear secondary to cumulative UV radiation exposure/sun exposure over time. They are chronic with expected duration over 1 year. A portion of actinic keratoses will progress to squamous cell carcinoma of the skin. It is not possible to reliably predict which spots will progress to skin cancer and so treatment is recommended to prevent development of skin cancer.  Recommend daily broad spectrum sunscreen SPF 30+ to sun-exposed areas, reapply every 2 hours as needed.  Recommend staying in the shade or wearing long sleeves, sun glasses (UVA+UVB protection) and wide brim hats (4-inch brim around the entire circumference of the hat). Call for new or changing lesions.  Destruction of lesion - B/L arms and hands x 20 (20) Complexity: simple   Destruction method: cryotherapy   Informed consent: discussed and consent obtained   Timeout:  patient name, date of birth, surgical site, and procedure verified Lesion destroyed using liquid nitrogen: Yes   Region frozen until ice ball extended beyond  lesion: Yes   Outcome: patient tolerated procedure well with no complications   Post-procedure details: wound care instructions given     Pressure injury of skin of left buttock, unspecified injury stage L med buttock - 1.5 cm scaly plaque Chronic and persistent condition with duration or expected duration over one year. Condition is symptomatic / bothersome to patient. Not to goal. Continue Tacrolimus  0.1% ointment to aa's QD-BID  Purpura - Chronic; persistent and recurrent.  Treatable, but not curable. - Violaceous macules and patches - Benign - Related to trauma, age, sun damage and/or use of blood thinners, chronic use of topical and/or oral steroids - Observe - Can use OTC arnica containing moisturizer such as Dermend Bruise Formula if desired - Call for worsening or other concerns  Return in about 1 year (around 01/08/2025) for TBSE.  LILLETTE Rosina Mayans, CMA, am acting as scribe for Alm Rhyme, MD .   Documentation: I have reviewed the above documentation for accuracy and completeness, and I agree with the above.  Alm Rhyme, MD

## 2024-01-10 ENCOUNTER — Encounter: Payer: Self-pay | Admitting: Dermatology

## 2024-01-11 ENCOUNTER — Ambulatory Visit: Payer: Self-pay | Admitting: Dermatology

## 2024-01-11 LAB — SURGICAL PATHOLOGY

## 2024-01-12 ENCOUNTER — Encounter: Payer: Self-pay | Admitting: Dermatology

## 2024-01-12 MED ORDER — TACROLIMUS 0.1 % EX OINT: TOPICAL_OINTMENT | CUTANEOUS | 5 refills | Status: AC

## 2024-01-12 NOTE — Telephone Encounter (Addendum)
 Called patient and discussed results with daughter, they both said they had reviewed results on mychart and denied further questions or concerns. Will recheck at next follow up.   ----- Message from Alm Rhyme sent at 01/11/2024  6:35 PM EDT ----- FINAL DIAGNOSIS        1. Skin, L lat forearm near the elbow :       MODERATELY DIFFERENTIATED SQUAMOUS CELL CARCINOMA   Cancer = SCC Moderately differentiated Already treated Recheck next visit Return to office if any evidence of re-growth ----- Message ----- From: Interface, Lab In Three Zero One Sent: 01/11/2024   5:39 PM EDT To: Alm JAYSON Rhyme, MD

## 2024-01-12 NOTE — Telephone Encounter (Addendum)
 Tried calling patient regarding results. No answer. LM for patient to return call. ----- Message from Cassandra Schaefer sent at 01/11/2024  6:35 PM EDT ----- FINAL DIAGNOSIS        1. Skin, L lat forearm near the elbow :       MODERATELY DIFFERENTIATED SQUAMOUS CELL CARCINOMA   Cancer = SCC Moderately differentiated Already treated Recheck next visit Return to office if any evidence of re-growth ----- Message ----- From: Interface, Lab In Three Zero One Sent: 01/11/2024   5:39 PM EDT To: Cassandra JAYSON Rhyme, MD

## 2024-01-12 NOTE — Addendum Note (Signed)
 Addended by: WILLMA KNEE A on: 01/12/2024 03:08 PM   Modules accepted: Orders

## 2024-02-07 ENCOUNTER — Ambulatory Visit (INDEPENDENT_AMBULATORY_CARE_PROVIDER_SITE_OTHER)

## 2024-02-07 ENCOUNTER — Encounter (INDEPENDENT_AMBULATORY_CARE_PROVIDER_SITE_OTHER): Payer: Self-pay | Admitting: Vascular Surgery

## 2024-02-07 ENCOUNTER — Ambulatory Visit (INDEPENDENT_AMBULATORY_CARE_PROVIDER_SITE_OTHER): Admitting: Vascular Surgery

## 2024-02-07 VITALS — BP 157/74 | HR 72 | Resp 16 | Ht 63.0 in | Wt 117.4 lb

## 2024-02-07 DIAGNOSIS — I10 Essential (primary) hypertension: Secondary | ICD-10-CM

## 2024-02-07 DIAGNOSIS — E1122 Type 2 diabetes mellitus with diabetic chronic kidney disease: Secondary | ICD-10-CM

## 2024-02-07 DIAGNOSIS — E785 Hyperlipidemia, unspecified: Secondary | ICD-10-CM

## 2024-02-07 DIAGNOSIS — N183 Chronic kidney disease, stage 3 unspecified: Secondary | ICD-10-CM

## 2024-02-07 DIAGNOSIS — I63239 Cerebral infarction due to unspecified occlusion or stenosis of unspecified carotid arteries: Secondary | ICD-10-CM | POA: Diagnosis not present

## 2024-02-07 DIAGNOSIS — I70213 Atherosclerosis of native arteries of extremities with intermittent claudication, bilateral legs: Secondary | ICD-10-CM

## 2024-02-07 NOTE — Progress Notes (Signed)
 MRN : [id number]  Cassandra Schaefer is a 89 y.o. (1929/02/22) female who presents with chief complaint of  Chief Complaint  Patient presents with   Follow-up    3-4 month follow up +ABI  .  History of Present Illness: Patient returns in follow-up of her PAD.  She continues to walk and be active despite her advanced age.  She is doing quite well and does not have any lifestyle limiting claudication, ischemic rest pain, or ulceration.  She does have some mild leg pain with activity but nothing that stops her much. ABIs today are stable at 0.59 bilaterally without significant change from previous studies.   Current Outpatient Medications  Medication Sig Dispense Refill   allopurinol (ZYLOPRIM) 100 MG tablet Take 100 mg by mouth daily.     aspirin  81 MG chewable tablet Chew 1 tablet (81 mg total) by mouth daily.     atorvastatin  (LIPITOR ) 80 MG tablet Take 1 tablet (80 mg total) by mouth daily after supper. 30 tablet 0   Blood Glucose Monitoring Suppl (FIFTY50 GLUCOSE METER 2.0) w/Device KIT Use as directed Dx code: 250.00     colchicine  0.6 MG tablet Take 0.5 tablets (0.3 mg total) by mouth daily. 30 tablet 0   fosinopril (MONOPRIL) 10 MG tablet Take 1 tablet by mouth daily.     glipiZIDE (GLUCOTROL) 5 MG tablet Take 2.5 mg by mouth 2 (two) times daily. (Patient taking differently: Take 2.5 mg by mouth daily before breakfast.)     glucose blood test strip USE TO TEST BLOOD SUGAR DAILY. DX CODE: E11.9     levothyroxine  (SYNTHROID ) 75 MCG tablet Take 88 mcg by mouth daily.     metFORMIN  (GLUCOPHAGE -XR) 500 MG 24 hr tablet Take 1 tablet (500 mg total) by mouth at bedtime. 30 tablet 0   Multiple Vitamins-Minerals (ICAPS AREDS 2 PO) Take by mouth 2 (two) times daily. Reported on 06/23/2015     tacrolimus  (PROTOPIC ) 0.1 % ointment Apply to aa ulcer QD-BID PRN. 100 g 5   vitamin B-12 (CYANOCOBALAMIN ) 1000 MCG tablet Take 2,000 mcg by mouth daily.      No current facility-administered medications  for this visit.    Past Medical History:  Diagnosis Date   Anemia    Cough    RESOLVING, NO FEVER   Diabetes mellitus without complication (HCC)    Gout    Hypertension    Hypothyroidism    PUD (peptic ulcer disease)    SCC (squamous cell carcinoma) 01/09/2024   left lateral forearm near the elbow - ED&C done   Stroke [facility])    Wears dentures    full upper, partial lower    Past Surgical History:  Procedure Laterality Date   AMPUTATION TOE Left 12/14/2017   Procedure: AMPUTATION TOE-4TH MPJ;  Surgeon: Ashley Soulier, DPM;  Location: [facility];  Service: Podiatry;  Laterality: Left;  IVA LOCAL Diabetic - oral meds   APPENDECTOMY     CATARACT EXTRACTION W/PHACO Right 06/23/2015   Procedure: CATARACT EXTRACTION PHACO AND INTRAOCULAR LENS PLACEMENT (IOC);  Surgeon: Steven Dingeldein, MD;  Location: [facility];  Service: Ophthalmology;  Laterality: Right;  US  01:28 AP% 23.4 CDE 36.14 fluid pack lot # [id number]   CATARACT EXTRACTION W/PHACO Left 07/28/2015   Procedure: CATARACT EXTRACTION PHACO AND INTRAOCULAR LENS PLACEMENT (IOC);  Surgeon: Steven Dingeldein, MD;  Location: [facility];  Service: Ophthalmology;  Laterality: Left;  US     1:29.7 AP%  24.9 CDE   41.32  fluid casette lot #[id number] H exp 09/23/2016   COONOSCOPY     AND ENDOSCOPY   DILATION AND CURETTAGE OF UTERUS     ENDARTERECTOMY Left 11/13/2020   Procedure: Evacuation of left neck hematoma;  Surgeon: Jama Cordella MATSU, MD;  Location: [facility];  Service: Vascular;  Laterality: Left;   ENDARTERECTOMY Left 11/13/2020   Procedure: ENDARTERECTOMY CAROTID;  Surgeon: Marea Selinda RAMAN, MD;  Location: [facility];  Service: Vascular;  Laterality: Left;   TONSILLECTOMY     TUBAL LIGATION       Social History   Tobacco Use   Smoking status: Former    Current packs/day: 0.00    Types: Cigarettes    Quit date: 12/26/1983    Years since quitting: 40.1   Smokeless tobacco: Never  Vaping Use   Vaping status: Never Used   Substance Use Topics   Alcohol use: No      Family History  Problem Relation Age of Onset   Breast cancer Neg Hx      Allergies  Allergen Reactions   Penicillins Hives   Sulfa Antibiotics Hives   Tetanus Toxoid-Containing Vaccines Swelling    Per family, syncope   Macrobid [Nitrofurantoin] Nausea And Vomiting and Other (See Comments)    HYPOTENSION   Procaine Other (See Comments)    went into shock   Tuberculin Tests Swelling and Rash      REVIEW OF SYSTEMS (Negative unless checked)   Constitutional: [] Weight loss  [] Fever  [] Chills Cardiac: [] Chest pain   [] Chest pressure   [] Palpitations   [] Shortness of breath when laying flat   [] Shortness of breath at rest   [] Shortness of breath with exertion. Vascular:  [x] Pain in legs with walking   [] Pain in legs at rest   [] Pain in legs when laying flat   [] Claudication   [] Pain in feet when walking  [] Pain in feet at rest  [] Pain in feet when laying flat   [] History of DVT   [] Phlebitis   [] Swelling in legs   [] Varicose veins   [] Non-healing ulcers Pulmonary:   [] Uses home oxygen   [] Productive cough   [] Hemoptysis   [] Wheeze  [] COPD   [] Asthma Neurologic:  [] Dizziness  [] Blackouts   [] Seizures   [x] History of stroke   [] History of TIA  [] Aphasia   [] Temporary blindness   [] Dysphagia   [] Weakness or numbness in arms   [] Weakness or numbness in legs Musculoskeletal:  [x] Arthritis   [] Joint swelling   [] Joint pain   [] Low back pain Hematologic:  [] Easy bruising  [] Easy bleeding   [] Hypercoagulable state   [x] Anemic  [] Hepatitis Gastrointestinal:  [] Blood in stool   [] Vomiting blood  [x] Gastroesophageal reflux/heartburn   [] Difficulty swallowing. Genitourinary:  [] Chronic kidney disease   [] Difficult urination  [] Frequent urination  [] Burning with urination   [] Blood in urine Skin:  [] Rashes   [] Ulcers   [] Wounds Psychological:  [] History of anxiety   []  History of major depression.  Physical Examination  Vitals:   02/07/24  1505  BP: (!) 157/74  Pulse: 72  Resp: 16  Weight: 117 lb 6.4 oz (53.3 kg)  Height: 5' 3 (1.6 m)   Body mass index is 20.8 kg/m. Gen:  WD/WN, NAD. Appears younger than stated age. Head: [place]/AT, No temporalis wasting. Ear/Nose/Throat: Hearing grossly intact, nares w/o erythema or drainage, trachea midline Eyes: Conjunctiva clear. Sclera non-icteric Neck: Supple.  Trachea midline Pulmonary:  Good air movement, equal and clear to auscultation bilaterally.  Cardiac: RRR, No JVD Vascular:  Vessel Right Left  Radial Palpable Palpable                          PT 1+ Palpable 1+ Palpable  DP 1+ Palpable 1+ Palpable    Musculoskeletal: M/S 5/5 throughout.  No deformity or atrophy. Neurologic: CN 2-12 intact. Sensation grossly intact in extremities.  Symmetrical.  Speech is is still somewhat limited and difficult with aphasia after previous stroke. Motor exam as listed above. Psychiatric: Judgment intact, Mood & affect appropriate for pt's clinical situation. Dermatologic: No rashes or ulcers noted.  No cellulitis or open wounds.     CBC Lab Results  Component Value Date   WBC 13.5 (H) 11/17/2020   HGB 9.4 (L) 11/17/2020   HCT 29.0 (L) 11/17/2020   MCV 95.7 11/17/2020   PLT 204 11/17/2020    BMET    Component Value Date/Time   NA 139 11/15/2020 0726   K 4.2 11/15/2020 0726   CL 110 11/15/2020 0726   CO2 21 (L) 11/15/2020 0726   GLUCOSE 136 (H) 11/15/2020 0726   BUN 29 (H) 11/15/2020 0726   CREATININE 1.47 (H) 11/15/2020 0726   CALCIUM  8.6 (L) 11/15/2020 0726   GFRNONAA 34 (L) 11/15/2020 0726   CrCl cannot be calculated (Patient's most recent lab result is older than the maximum 21 days allowed.).  COAG Lab Results  Component Value Date   INR 1.1 10/19/2020    Radiology No results found.   Assessment/Plan Carotid stenosis, symptomatic, with infarction (HCC) No new symptoms since endarterectomy.  To be checked again next year.  Atherosclerosis of native  arteries of extremity with intermittent claudication ABIs today are stable at 0.59 bilaterally without significant change from previous studies.  Really not much in the way of symptoms that are worrisome.  Mild claudication.  Given her age and other comorbidities with mild symptoms, there is clearly no role for intervention.  I will plan to recheck her ABIs when she follows up to have her carotid duplex early next year.  Hypertension blood pressure control important in reducing the progression of atherosclerotic disease. On appropriate oral medications.     Type 2 diabetes mellitus (HCC) blood glucose control important in reducing the progression of atherosclerotic disease. Also, involved in wound healing. On appropriate medications.     Hyperlipidemia, unspecified lipid control important in reducing the progression of atherosclerotic disease. Continue statin therapy  Selinda Gu, MD  02/07/2024 3:33 PM    This note was created with Dragon medical transcription system.  Any errors from dictation are purely unintentional

## 2024-02-07 NOTE — Assessment & Plan Note (Signed)
 ABIs today are stable at 0.59 bilaterally without significant change from previous studies.  Really not much in the way of symptoms that are worrisome.  Mild claudication.  Given her age and other comorbidities with mild symptoms, there is clearly no role for intervention.  I will plan to recheck her ABIs when she follows up to have her carotid duplex early next year.

## 2024-02-07 NOTE — Assessment & Plan Note (Signed)
 No new symptoms since endarterectomy.  To be checked again next year.

## 2024-02-09 LAB — VAS US ABI WITH/WO TBI
Left ABI: 0.59
Right ABI: 0.59

## 2024-11-06 ENCOUNTER — Encounter (INDEPENDENT_AMBULATORY_CARE_PROVIDER_SITE_OTHER)

## 2024-11-06 ENCOUNTER — Ambulatory Visit (INDEPENDENT_AMBULATORY_CARE_PROVIDER_SITE_OTHER): Admitting: Vascular Surgery

## 2025-01-08 ENCOUNTER — Ambulatory Visit: Admitting: Dermatology
